# Patient Record
Sex: Female | Born: 1955 | Race: White | Hispanic: No | Smoking: Former smoker
Health system: Southern US, Community
[De-identification: ages and names within clinical notes are randomized; demographics above are authoritative.]

## PROBLEM LIST (undated history)

## (undated) DIAGNOSIS — M1990 Unspecified osteoarthritis, unspecified site: Secondary | ICD-10-CM

## (undated) DIAGNOSIS — I6523 Occlusion and stenosis of bilateral carotid arteries: Secondary | ICD-10-CM

## (undated) DIAGNOSIS — E785 Hyperlipidemia, unspecified: Secondary | ICD-10-CM

## (undated) DIAGNOSIS — J439 Emphysema, unspecified: Secondary | ICD-10-CM

## (undated) DIAGNOSIS — I700 Atherosclerosis of aorta: Secondary | ICD-10-CM

## (undated) DIAGNOSIS — J449 Chronic obstructive pulmonary disease, unspecified: Secondary | ICD-10-CM

## (undated) DIAGNOSIS — D649 Anemia, unspecified: Secondary | ICD-10-CM

## (undated) DIAGNOSIS — C801 Malignant (primary) neoplasm, unspecified: Secondary | ICD-10-CM

## (undated) DIAGNOSIS — C482 Malignant neoplasm of peritoneum, unspecified: Secondary | ICD-10-CM

## (undated) DIAGNOSIS — I739 Peripheral vascular disease, unspecified: Secondary | ICD-10-CM

## (undated) DIAGNOSIS — J45909 Unspecified asthma, uncomplicated: Secondary | ICD-10-CM

## (undated) DIAGNOSIS — Z9981 Dependence on supplemental oxygen: Secondary | ICD-10-CM

## (undated) DIAGNOSIS — C569 Malignant neoplasm of unspecified ovary: Secondary | ICD-10-CM

## (undated) DIAGNOSIS — I10 Essential (primary) hypertension: Secondary | ICD-10-CM

## (undated) DIAGNOSIS — G629 Polyneuropathy, unspecified: Secondary | ICD-10-CM

## (undated) DIAGNOSIS — E7800 Pure hypercholesterolemia, unspecified: Secondary | ICD-10-CM

## (undated) HISTORY — PX: TUBAL LIGATION: SHX77

## (undated) HISTORY — DX: Anemia, unspecified: D64.9

## (undated) HISTORY — DX: Polyneuropathy, unspecified: G62.9

---

## 2006-07-15 ENCOUNTER — Ambulatory Visit: Payer: Self-pay | Admitting: Nurse Practitioner

## 2007-03-12 ENCOUNTER — Ambulatory Visit: Payer: Self-pay | Admitting: Nurse Practitioner

## 2009-02-14 ENCOUNTER — Ambulatory Visit: Payer: Self-pay

## 2009-03-21 ENCOUNTER — Ambulatory Visit: Payer: Self-pay | Admitting: Family Medicine

## 2009-03-30 ENCOUNTER — Ambulatory Visit: Payer: Self-pay | Admitting: Family Medicine

## 2009-12-08 ENCOUNTER — Ambulatory Visit: Payer: Self-pay | Admitting: Family Medicine

## 2010-03-22 ENCOUNTER — Ambulatory Visit: Payer: Self-pay | Admitting: Family Medicine

## 2010-10-30 ENCOUNTER — Emergency Department: Payer: Self-pay | Admitting: Internal Medicine

## 2011-05-10 ENCOUNTER — Ambulatory Visit: Payer: Self-pay | Admitting: Family Medicine

## 2011-07-22 ENCOUNTER — Emergency Department: Payer: Self-pay | Admitting: Emergency Medicine

## 2012-08-25 ENCOUNTER — Ambulatory Visit: Payer: Self-pay | Admitting: Family Medicine

## 2013-06-15 ENCOUNTER — Inpatient Hospital Stay: Payer: Self-pay | Admitting: Internal Medicine

## 2013-06-15 LAB — CBC
HCT: 48.9 % — ABNORMAL HIGH (ref 35.0–47.0)
HGB: 16.9 g/dL — ABNORMAL HIGH (ref 12.0–16.0)
MCV: 90 fL (ref 80–100)
Platelet: 291 10*3/uL (ref 150–440)
RBC: 5.46 10*6/uL — ABNORMAL HIGH (ref 3.80–5.20)
WBC: 10.5 10*3/uL (ref 3.6–11.0)

## 2013-06-15 LAB — BASIC METABOLIC PANEL
BUN: 4 mg/dL — ABNORMAL LOW (ref 7–18)
Calcium, Total: 9.6 mg/dL (ref 8.5–10.1)
Chloride: 91 mmol/L — ABNORMAL LOW (ref 98–107)
Co2: 37 mmol/L — ABNORMAL HIGH (ref 21–32)
EGFR (Non-African Amer.): >60
Glucose: 118 mg/dL — ABNORMAL HIGH (ref 65–99)
Potassium: 3.9 mmol/L (ref 3.5–5.1)
Sodium: 134 mmol/L — ABNORMAL LOW (ref 136–145)

## 2013-06-15 LAB — MAGNESIUM: Magnesium: 2.1 mg/dL

## 2013-06-15 LAB — PRO B NATRIURETIC PEPTIDE: B-Type Natriuretic Peptide: 15 pg/mL (ref 0–125)

## 2013-06-15 LAB — TROPONIN I: Troponin-I: <0.02 ng/mL

## 2013-06-16 LAB — CBC WITH DIFFERENTIAL/PLATELET
Basophil #: 0 10*3/uL (ref 0.0–0.1)
Eosinophil #: 0 10*3/uL (ref 0.0–0.7)
Eosinophil %: 0.1 %
HGB: 14.6 g/dL (ref 12.0–16.0)
Lymphocyte #: 0.4 10*3/uL — ABNORMAL LOW (ref 1.0–3.6)
MCH: 30.5 pg (ref 26.0–34.0)
Monocyte #: 0.2 x10 3/mm (ref 0.2–0.9)
Monocyte %: 4 %
RDW: 12.9 % (ref 11.5–14.5)
WBC: 5.7 10*3/uL (ref 3.6–11.0)

## 2013-06-16 LAB — BASIC METABOLIC PANEL
Anion Gap: 4 — ABNORMAL LOW (ref 7–16)
BUN: 7 mg/dL (ref 7–18)
Chloride: 98 mmol/L (ref 98–107)
Co2: 33 mmol/L — ABNORMAL HIGH (ref 21–32)
Creatinine: 0.53 mg/dL — ABNORMAL LOW (ref 0.60–1.30)
EGFR (African American): >60
Glucose: 135 mg/dL — ABNORMAL HIGH (ref 65–99)
Osmolality: 270 (ref 275–301)
Potassium: 4.2 mmol/L (ref 3.5–5.1)

## 2013-09-16 ENCOUNTER — Emergency Department: Payer: Self-pay | Admitting: Emergency Medicine

## 2013-09-16 LAB — CBC
HGB: 14 g/dL (ref 12.0–16.0)
MCH: 30.5 pg (ref 26.0–34.0)
MCHC: 34.6 g/dL (ref 32.0–36.0)
Platelet: 340 10*3/uL (ref 150–440)
RBC: 4.6 10*6/uL (ref 3.80–5.20)
RDW: 13.7 % (ref 11.5–14.5)
WBC: 7.8 10*3/uL (ref 3.6–11.0)

## 2013-09-17 LAB — BASIC METABOLIC PANEL
Anion Gap: 5 — ABNORMAL LOW (ref 7–16)
BUN: 6 mg/dL — ABNORMAL LOW (ref 7–18)
Calcium, Total: 9.6 mg/dL (ref 8.5–10.1)
Co2: 33 mmol/L — ABNORMAL HIGH (ref 21–32)
EGFR (Non-African Amer.): >60
Osmolality: 270 (ref 275–301)
Potassium: 3.2 mmol/L — ABNORMAL LOW (ref 3.5–5.1)

## 2013-09-17 LAB — CK TOTAL AND CKMB (NOT AT ARMC): CK, Total: 57 U/L (ref 21–215)

## 2013-09-17 LAB — TROPONIN I: Troponin-I: <0.02 ng/mL

## 2013-09-24 ENCOUNTER — Emergency Department: Payer: Self-pay | Admitting: Internal Medicine

## 2013-09-24 LAB — URINALYSIS, COMPLETE
Bilirubin,UR: NEGATIVE
Ketone: NEGATIVE
Leukocyte Esterase: NEGATIVE
Nitrite: NEGATIVE
Protein: NEGATIVE
RBC,UR: NONE SEEN /HPF (ref 0–5)
Specific Gravity: 1.003 (ref 1.003–1.030)
Squamous Epithelial: <1
WBC UR: NONE SEEN /HPF (ref 0–5)

## 2013-09-24 LAB — CBC
HCT: 39.1 % (ref 35.0–47.0)
HGB: 13.6 g/dL (ref 12.0–16.0)
MCH: 30.3 pg (ref 26.0–34.0)
MCV: 87 fL (ref 80–100)
Platelet: 262 10*3/uL (ref 150–440)
RBC: 4.49 10*6/uL (ref 3.80–5.20)
RDW: 13.6 % (ref 11.5–14.5)
WBC: 11.8 10*3/uL — ABNORMAL HIGH (ref 3.6–11.0)

## 2013-09-24 LAB — TROPONIN I: Troponin-I: <0.02 ng/mL

## 2013-09-24 LAB — COMPREHENSIVE METABOLIC PANEL
Albumin: 3.8 g/dL (ref 3.4–5.0)
Alkaline Phosphatase: 77 U/L (ref 50–136)
Anion Gap: 6 — ABNORMAL LOW (ref 7–16)
BUN: 6 mg/dL — ABNORMAL LOW (ref 7–18)
Chloride: 94 mmol/L — ABNORMAL LOW (ref 98–107)
EGFR (African American): >60
EGFR (Non-African Amer.): >60
Glucose: 100 mg/dL — ABNORMAL HIGH (ref 65–99)
Potassium: 3.6 mmol/L (ref 3.5–5.1)
SGOT(AST): 19 U/L (ref 15–37)
SGPT (ALT): 20 U/L (ref 12–78)
Sodium: 130 mmol/L — ABNORMAL LOW (ref 136–145)
Total Protein: 6.8 g/dL (ref 6.4–8.2)

## 2013-09-24 LAB — CK TOTAL AND CKMB (NOT AT ARMC): CK, Total: 36 U/L (ref 21–215)

## 2013-10-04 ENCOUNTER — Inpatient Hospital Stay: Payer: Self-pay | Admitting: Internal Medicine

## 2013-10-04 LAB — CBC
HCT: 35.8 % (ref 35.0–47.0)
HGB: 12.9 g/dL (ref 12.0–16.0)
MCH: 30.7 pg (ref 26.0–34.0)
MCV: 85 fL (ref 80–100)
Platelet: 222 10*3/uL (ref 150–440)
RBC: 4.2 10*6/uL (ref 3.80–5.20)
WBC: 10.6 10*3/uL (ref 3.6–11.0)

## 2013-10-04 LAB — URINALYSIS, COMPLETE
Glucose,UR: NEGATIVE mg/dL (ref 0–75)
Leukocyte Esterase: NEGATIVE
Protein: NEGATIVE
Squamous Epithelial: 2
WBC UR: 7 /HPF (ref 0–5)

## 2013-10-04 LAB — BASIC METABOLIC PANEL
Anion Gap: 5 — ABNORMAL LOW (ref 7–16)
BUN: 6 mg/dL — ABNORMAL LOW (ref 7–18)
Calcium, Total: 8.8 mg/dL (ref 8.5–10.1)
Chloride: 85 mmol/L — ABNORMAL LOW (ref 98–107)
EGFR (African American): >60
Potassium: 3.7 mmol/L (ref 3.5–5.1)
Sodium: 121 mmol/L — ABNORMAL LOW (ref 136–145)

## 2013-10-04 LAB — TROPONIN I: Troponin-I: <0.02 ng/mL

## 2013-10-05 LAB — BASIC METABOLIC PANEL
Anion Gap: 3 — ABNORMAL LOW (ref 7–16)
BUN: 6 mg/dL — ABNORMAL LOW (ref 7–18)
Calcium, Total: 8 mg/dL — ABNORMAL LOW (ref 8.5–10.1)
Co2: 30 mmol/L (ref 21–32)
Creatinine: 0.58 mg/dL — ABNORMAL LOW (ref 0.60–1.30)
EGFR (African American): >60
Glucose: 97 mg/dL (ref 65–99)
Osmolality: 260 (ref 275–301)

## 2013-10-05 LAB — CBC WITH DIFFERENTIAL/PLATELET
Basophil #: 0.1 10*3/uL (ref 0.0–0.1)
HCT: 32.5 % — ABNORMAL LOW (ref 35.0–47.0)
HGB: 11.7 g/dL — ABNORMAL LOW (ref 12.0–16.0)
Lymphocyte %: 20.1 %
MCH: 31.3 pg (ref 26.0–34.0)
MCV: 87 fL (ref 80–100)
Monocyte #: 0.8 x10 3/mm (ref 0.2–0.9)
Neutrophil #: 3.9 10*3/uL (ref 1.4–6.5)
RBC: 3.74 10*6/uL — ABNORMAL LOW (ref 3.80–5.20)
RDW: 13.4 % (ref 11.5–14.5)

## 2013-10-08 ENCOUNTER — Inpatient Hospital Stay: Payer: Self-pay | Admitting: Internal Medicine

## 2013-10-08 LAB — COMPREHENSIVE METABOLIC PANEL
Albumin: 3.2 g/dL — ABNORMAL LOW (ref 3.4–5.0)
Anion Gap: 7 (ref 7–16)
BUN: 4 mg/dL — ABNORMAL LOW (ref 7–18)
Calcium, Total: 8.8 mg/dL (ref 8.5–10.1)
Chloride: 91 mmol/L — ABNORMAL LOW (ref 98–107)
EGFR (African American): >60
EGFR (Non-African Amer.): >60
Glucose: 103 mg/dL — ABNORMAL HIGH (ref 65–99)
Potassium: 3.8 mmol/L (ref 3.5–5.1)
SGOT(AST): 20 U/L (ref 15–37)
Sodium: 128 mmol/L — ABNORMAL LOW (ref 136–145)
Total Protein: 5.7 g/dL — ABNORMAL LOW (ref 6.4–8.2)

## 2013-10-08 LAB — CBC
HCT: 31.3 % — ABNORMAL LOW (ref 35.0–47.0)
HGB: 11.1 g/dL — ABNORMAL LOW (ref 12.0–16.0)
MCH: 30.6 pg (ref 26.0–34.0)
MCHC: 35.4 g/dL (ref 32.0–36.0)
MCV: 86 fL (ref 80–100)
Platelet: 243 10*3/uL (ref 150–440)
RDW: 13.1 % (ref 11.5–14.5)
WBC: 7.3 10*3/uL (ref 3.6–11.0)

## 2013-10-08 LAB — URINALYSIS, COMPLETE
Bacteria: NONE SEEN
Bilirubin,UR: NEGATIVE
Blood: NEGATIVE
Glucose,UR: NEGATIVE mg/dL (ref 0–75)
Ketone: NEGATIVE
Nitrite: NEGATIVE
Protein: NEGATIVE
Specific Gravity: 1.003 (ref 1.003–1.030)
Squamous Epithelial: 1
WBC UR: <1 /HPF (ref 0–5)

## 2013-10-08 LAB — CK TOTAL AND CKMB (NOT AT ARMC)
CK, Total: 57 U/L (ref 21–215)
CK-MB: 1.5 ng/mL (ref 0.5–3.6)

## 2013-10-08 LAB — TROPONIN I: Troponin-I: 0.07 ng/mL — ABNORMAL HIGH

## 2013-10-09 LAB — LIPID PANEL
Ldl Cholesterol, Calc: 88 mg/dL (ref 0–100)
Triglycerides: 82 mg/dL (ref 0–200)

## 2013-10-09 LAB — CK TOTAL AND CKMB (NOT AT ARMC)
CK, Total: 58 U/L (ref 21–215)
CK, Total: 61 U/L (ref 21–215)
CK-MB: 2.9 ng/mL (ref 0.5–3.6)

## 2013-10-09 LAB — CK-MB
CK-MB: 2.9 ng/mL (ref 0.5–3.6)
CK-MB: 2.7 ng/mL (ref 0.5–3.6)

## 2013-10-09 LAB — TROPONIN I: Troponin-I: 0.35 ng/mL — ABNORMAL HIGH

## 2013-10-26 ENCOUNTER — Inpatient Hospital Stay: Payer: Self-pay | Admitting: Internal Medicine

## 2013-10-26 LAB — COMPREHENSIVE METABOLIC PANEL
Albumin: 3.5 g/dL (ref 3.4–5.0)
Alkaline Phosphatase: 65 U/L
BUN: 6 mg/dL — ABNORMAL LOW (ref 7–18)
Calcium, Total: 8.8 mg/dL (ref 8.5–10.1)
Co2: 28 mmol/L (ref 21–32)
Creatinine: 0.66 mg/dL (ref 0.60–1.30)
EGFR (African American): >60
EGFR (Non-African Amer.): >60
Osmolality: 266 (ref 275–301)
Potassium: 3.1 mmol/L — ABNORMAL LOW (ref 3.5–5.1)
SGOT(AST): 18 U/L (ref 15–37)
Total Protein: 6.2 g/dL — ABNORMAL LOW (ref 6.4–8.2)

## 2013-10-26 LAB — DIFFERENTIAL
Bands: 8 %
Comment - H1-Com2: NORMAL
Lymphocytes: 3 %
Monocytes: 3 %
Segmented Neutrophils: 86 %

## 2013-10-26 LAB — CBC
HGB: 11.9 g/dL — ABNORMAL LOW (ref 12.0–16.0)
MCHC: 33.2 g/dL (ref 32.0–36.0)
MCV: 88 fL (ref 80–100)
Platelet: 277 10*3/uL (ref 150–440)
RBC: 4.09 10*6/uL (ref 3.80–5.20)
WBC: 24 10*3/uL — ABNORMAL HIGH (ref 3.6–11.0)

## 2013-10-26 LAB — CK TOTAL AND CKMB (NOT AT ARMC)
CK, Total: 56 U/L (ref 21–215)
CK-MB: 0.8 ng/mL (ref 0.5–3.6)

## 2013-10-26 LAB — TROPONIN I: Troponin-I: <0.02 ng/mL

## 2013-10-27 LAB — CBC WITH DIFFERENTIAL/PLATELET
Eosinophil %: 0 %
HGB: 11.5 g/dL — ABNORMAL LOW (ref 12.0–16.0)
Lymphocyte %: 2.3 %
MCHC: 33.7 g/dL (ref 32.0–36.0)
Monocyte %: 2.6 %
Platelet: 257 10*3/uL (ref 150–440)
RBC: 3.88 10*6/uL (ref 3.80–5.20)
RDW: 13.3 % (ref 11.5–14.5)
WBC: 26 10*3/uL — ABNORMAL HIGH (ref 3.6–11.0)

## 2013-10-27 LAB — BASIC METABOLIC PANEL
Calcium, Total: 9.1 mg/dL (ref 8.5–10.1)
Co2: 29 mmol/L (ref 21–32)
EGFR (African American): >60
EGFR (Non-African Amer.): >60
Glucose: 139 mg/dL — ABNORMAL HIGH (ref 65–99)
Osmolality: 277 (ref 275–301)
Potassium: 3.5 mmol/L (ref 3.5–5.1)

## 2013-10-28 LAB — VANCOMYCIN, TROUGH: Vancomycin, Trough: 4 ug/mL — ABNORMAL LOW (ref 10–20)

## 2013-10-28 LAB — CBC WITH DIFFERENTIAL/PLATELET
Basophil #: 0 10*3/uL (ref 0.0–0.1)
Basophil %: 0.1 %
Eosinophil #: 0 10*3/uL (ref 0.0–0.7)
Eosinophil %: 0 %
HCT: 33.4 % — ABNORMAL LOW (ref 35.0–47.0)
HGB: 11.4 g/dL — ABNORMAL LOW (ref 12.0–16.0)
Lymphocyte #: 0.5 10*3/uL — ABNORMAL LOW (ref 1.0–3.6)
MCH: 30.2 pg (ref 26.0–34.0)
MCHC: 34.2 g/dL (ref 32.0–36.0)
MCV: 88 fL (ref 80–100)
Monocyte #: 0.8 x10 3/mm (ref 0.2–0.9)
Neutrophil %: 94.9 %
RDW: 13.3 % (ref 11.5–14.5)
WBC: 24.8 10*3/uL — ABNORMAL HIGH (ref 3.6–11.0)

## 2013-10-28 LAB — BASIC METABOLIC PANEL
Anion Gap: 7 (ref 7–16)
EGFR (African American): >60
EGFR (Non-African Amer.): >60
Osmolality: 278 (ref 275–301)
Potassium: 3.5 mmol/L (ref 3.5–5.1)
Sodium: 139 mmol/L (ref 136–145)

## 2013-10-29 LAB — BASIC METABOLIC PANEL
BUN: 6 mg/dL — ABNORMAL LOW (ref 7–18)
Chloride: 104 mmol/L (ref 98–107)
EGFR (African American): >60
EGFR (Non-African Amer.): >60
Osmolality: 277 (ref 275–301)

## 2013-10-29 LAB — CBC WITH DIFFERENTIAL/PLATELET
Basophil %: 0.2 %
MCV: 88 fL (ref 80–100)
Monocyte %: 4.4 %
Neutrophil #: 12.5 10*3/uL — ABNORMAL HIGH (ref 1.4–6.5)
Platelet: 285 10*3/uL (ref 150–440)
RBC: 3.49 10*6/uL — ABNORMAL LOW (ref 3.80–5.20)
RDW: 13.1 % (ref 11.5–14.5)
WBC: 13.7 10*3/uL — ABNORMAL HIGH (ref 3.6–11.0)

## 2013-10-30 LAB — VANCOMYCIN, TROUGH: Vancomycin, Trough: 17 ug/mL (ref 10–20)

## 2013-10-31 LAB — CULTURE, BLOOD (SINGLE)

## 2013-11-02 LAB — CREATININE, SERUM
Creatinine: 0.63 mg/dL (ref 0.60–1.30)
EGFR (African American): >60

## 2013-11-02 LAB — VANCOMYCIN, TROUGH: Vancomycin, Trough: 15 ug/mL (ref 10–20)

## 2013-12-06 ENCOUNTER — Emergency Department: Payer: Self-pay | Admitting: Emergency Medicine

## 2013-12-06 LAB — COMPREHENSIVE METABOLIC PANEL
ALBUMIN: 3.1 g/dL — AB (ref 3.4–5.0)
ANION GAP: 5 — AB (ref 7–16)
Alkaline Phosphatase: 86 U/L
BILIRUBIN TOTAL: 0.2 mg/dL (ref 0.2–1.0)
BUN: 4 mg/dL — ABNORMAL LOW (ref 7–18)
CALCIUM: 8.7 mg/dL (ref 8.5–10.1)
CHLORIDE: 104 mmol/L (ref 98–107)
Co2: 29 mmol/L (ref 21–32)
Creatinine: 0.62 mg/dL (ref 0.60–1.30)
EGFR (African American): >60
EGFR (Non-African Amer.): >60
Glucose: 92 mg/dL (ref 65–99)
Osmolality: 272 (ref 275–301)
Potassium: 3.5 mmol/L (ref 3.5–5.1)
SGOT(AST): 26 U/L (ref 15–37)
SGPT (ALT): 19 U/L (ref 12–78)
Sodium: 138 mmol/L (ref 136–145)
Total Protein: 6.5 g/dL (ref 6.4–8.2)

## 2013-12-06 LAB — CBC
HCT: 32.4 % — ABNORMAL LOW (ref 35.0–47.0)
HGB: 11 g/dL — ABNORMAL LOW (ref 12.0–16.0)
MCH: 29.2 pg (ref 26.0–34.0)
MCHC: 33.8 g/dL (ref 32.0–36.0)
MCV: 87 fL (ref 80–100)
Platelet: 317 10*3/uL (ref 150–440)
RBC: 3.75 10*6/uL — ABNORMAL LOW (ref 3.80–5.20)
RDW: 14.4 % (ref 11.5–14.5)
WBC: 10.2 10*3/uL (ref 3.6–11.0)

## 2013-12-06 LAB — CK TOTAL AND CKMB (NOT AT ARMC)
CK, TOTAL: 51 U/L (ref 21–215)
CK-MB: 0.7 ng/mL (ref 0.5–3.6)

## 2013-12-06 LAB — TROPONIN I: Troponin-I: <0.02 ng/mL

## 2014-02-14 ENCOUNTER — Ambulatory Visit: Payer: Self-pay | Admitting: Family Medicine

## 2014-05-26 ENCOUNTER — Ambulatory Visit: Payer: Self-pay | Admitting: Family Medicine

## 2014-07-19 ENCOUNTER — Emergency Department: Payer: Self-pay | Admitting: Internal Medicine

## 2014-07-19 LAB — COMPREHENSIVE METABOLIC PANEL
ALT: 16 U/L
Albumin: 3.6 g/dL (ref 3.4–5.0)
Alkaline Phosphatase: 99 U/L
Anion Gap: 7 (ref 7–16)
BILIRUBIN TOTAL: 0.2 mg/dL (ref 0.2–1.0)
BUN: 8 mg/dL (ref 7–18)
Calcium, Total: 8.8 mg/dL (ref 8.5–10.1)
Chloride: 106 mmol/L (ref 98–107)
Co2: 30 mmol/L (ref 21–32)
Creatinine: 0.78 mg/dL (ref 0.60–1.30)
EGFR (African American): >60
Glucose: 99 mg/dL (ref 65–99)
Osmolality: 283 (ref 275–301)
POTASSIUM: 3.7 mmol/L (ref 3.5–5.1)
SGOT(AST): 29 U/L (ref 15–37)
Sodium: 143 mmol/L (ref 136–145)
Total Protein: 6.8 g/dL (ref 6.4–8.2)

## 2014-07-19 LAB — CBC
HCT: 34.2 % — AB (ref 35.0–47.0)
HGB: 11.1 g/dL — ABNORMAL LOW (ref 12.0–16.0)
MCH: 27.6 pg (ref 26.0–34.0)
MCHC: 32.4 g/dL (ref 32.0–36.0)
MCV: 85 fL (ref 80–100)
PLATELETS: 272 10*3/uL (ref 150–440)
RBC: 4 10*6/uL (ref 3.80–5.20)
RDW: 13.9 % (ref 11.5–14.5)
WBC: 6.2 10*3/uL (ref 3.6–11.0)

## 2014-07-19 LAB — TROPONIN I: Troponin-I: <0.02 ng/mL

## 2014-07-19 LAB — CK TOTAL AND CKMB (NOT AT ARMC)
CK, Total: 131 U/L
CK-MB: 1.1 ng/mL (ref 0.5–3.6)

## 2014-10-18 ENCOUNTER — Emergency Department: Payer: Self-pay | Admitting: Emergency Medicine

## 2014-10-18 LAB — CBC
HCT: 32.1 % — ABNORMAL LOW (ref 35.0–47.0)
HGB: 10.5 g/dL — AB (ref 12.0–16.0)
MCH: 27.1 pg (ref 26.0–34.0)
MCHC: 32.7 g/dL (ref 32.0–36.0)
MCV: 83 fL (ref 80–100)
Platelet: 303 10*3/uL (ref 150–440)
RBC: 3.86 10*6/uL (ref 3.80–5.20)
RDW: 13.4 % (ref 11.5–14.5)
WBC: 6.4 10*3/uL (ref 3.6–11.0)

## 2014-10-18 LAB — COMPREHENSIVE METABOLIC PANEL
ALK PHOS: 98 U/L
ANION GAP: 5 — AB (ref 7–16)
AST: 24 U/L (ref 15–37)
Albumin: 3.9 g/dL (ref 3.4–5.0)
BILIRUBIN TOTAL: 0.3 mg/dL (ref 0.2–1.0)
BUN: 5 mg/dL — ABNORMAL LOW (ref 7–18)
CO2: 33 mmol/L — AB (ref 21–32)
Calcium, Total: 9.1 mg/dL (ref 8.5–10.1)
Chloride: 102 mmol/L (ref 98–107)
Creatinine: 0.61 mg/dL (ref 0.60–1.30)
EGFR (Non-African Amer.): >60
Glucose: 102 mg/dL — ABNORMAL HIGH (ref 65–99)
Osmolality: 277 (ref 275–301)
POTASSIUM: 3.7 mmol/L (ref 3.5–5.1)
SGPT (ALT): 19 U/L
Sodium: 140 mmol/L (ref 136–145)
Total Protein: 6.9 g/dL (ref 6.4–8.2)

## 2014-10-18 LAB — TROPONIN I

## 2014-11-07 ENCOUNTER — Emergency Department: Payer: Self-pay | Admitting: Emergency Medicine

## 2014-11-07 LAB — BASIC METABOLIC PANEL
Anion Gap: 8 (ref 7–16)
BUN: 6 mg/dL — ABNORMAL LOW (ref 7–18)
CHLORIDE: 102 mmol/L (ref 98–107)
Calcium, Total: 9.4 mg/dL (ref 8.5–10.1)
Co2: 29 mmol/L (ref 21–32)
Creatinine: 0.65 mg/dL (ref 0.60–1.30)
EGFR (African American): >60
GLUCOSE: 108 mg/dL — AB (ref 65–99)
OSMOLALITY: 276 (ref 275–301)
POTASSIUM: 3.6 mmol/L (ref 3.5–5.1)
Sodium: 139 mmol/L (ref 136–145)

## 2014-11-07 LAB — TROPONIN I: Troponin-I: <0.02 ng/mL

## 2014-11-07 LAB — CBC
HCT: 32.4 % — AB (ref 35.0–47.0)
HGB: 10.6 g/dL — AB (ref 12.0–16.0)
MCH: 26.7 pg (ref 26.0–34.0)
MCHC: 32.6 g/dL (ref 32.0–36.0)
MCV: 82 fL (ref 80–100)
Platelet: 323 10*3/uL (ref 150–440)
RBC: 3.96 10*6/uL (ref 3.80–5.20)
RDW: 13.5 % (ref 11.5–14.5)
WBC: 13.5 10*3/uL — ABNORMAL HIGH (ref 3.6–11.0)

## 2014-11-07 LAB — PRO B NATRIURETIC PEPTIDE: B-Type Natriuretic Peptide: 76 pg/mL (ref 0–125)

## 2015-02-17 ENCOUNTER — Emergency Department (HOSPITAL_COMMUNITY): Payer: Medicare HMO

## 2015-02-17 ENCOUNTER — Encounter (HOSPITAL_COMMUNITY): Payer: Self-pay | Admitting: Emergency Medicine

## 2015-02-17 ENCOUNTER — Emergency Department (HOSPITAL_COMMUNITY)
Admission: EM | Admit: 2015-02-17 | Discharge: 2015-02-17 | Disposition: A | Discharge status: Home / Self Care | Payer: Medicare HMO | Attending: Emergency Medicine | Admitting: Emergency Medicine

## 2015-02-17 DIAGNOSIS — Z79899 Other long term (current) drug therapy: Secondary | ICD-10-CM | POA: Insufficient documentation

## 2015-02-17 DIAGNOSIS — J441 Chronic obstructive pulmonary disease with (acute) exacerbation: Secondary | ICD-10-CM | POA: Diagnosis not present

## 2015-02-17 DIAGNOSIS — E780 Pure hypercholesterolemia: Secondary | ICD-10-CM | POA: Diagnosis not present

## 2015-02-17 DIAGNOSIS — R0602 Shortness of breath: Secondary | ICD-10-CM | POA: Diagnosis present

## 2015-02-17 DIAGNOSIS — I10 Essential (primary) hypertension: Secondary | ICD-10-CM | POA: Insufficient documentation

## 2015-02-17 HISTORY — DX: Essential (primary) hypertension: I10

## 2015-02-17 HISTORY — DX: Chronic obstructive pulmonary disease, unspecified: J44.9

## 2015-02-17 HISTORY — DX: Pure hypercholesterolemia, unspecified: E78.00

## 2015-02-17 LAB — TROPONIN I

## 2015-02-17 LAB — CBC
HCT: 35.6 % — ABNORMAL LOW (ref 36.0–46.0)
Hemoglobin: 11.2 g/dL — ABNORMAL LOW (ref 12.0–15.0)
MCH: 27.3 pg (ref 26.0–34.0)
MCHC: 31.5 g/dL (ref 30.0–36.0)
MCV: 86.6 fL (ref 78.0–100.0)
Platelets: 254 10*3/uL (ref 150–400)
RBC: 4.11 MIL/uL (ref 3.87–5.11)
RDW: 15.2 % (ref 11.5–15.5)
WBC: 8.8 10*3/uL (ref 4.0–10.5)

## 2015-02-17 LAB — BASIC METABOLIC PANEL
Anion gap: 8 (ref 5–15)
BUN: 6 mg/dL (ref 6–23)
CALCIUM: 8.9 mg/dL (ref 8.4–10.5)
CO2: 31 mmol/L (ref 19–32)
Chloride: 100 mmol/L (ref 96–112)
Creatinine, Ser: 0.68 mg/dL (ref 0.50–1.10)
GFR calc Af Amer: >90 mL/min (ref >90)
GFR calc non Af Amer: >90 mL/min (ref >90)
GLUCOSE: 112 mg/dL — AB (ref 70–99)
Potassium: 3.4 mmol/L — ABNORMAL LOW (ref 3.5–5.1)
SODIUM: 139 mmol/L (ref 135–145)

## 2015-02-17 MED ORDER — HYDROCODONE-ACETAMINOPHEN 5-325 MG PO TABS
ORAL_TABLET | ORAL | Status: AC
  Administered 2015-02-17: 1 via ORAL
  Filled 2015-02-17: qty 1

## 2015-02-17 MED ORDER — PREDNISONE 20 MG PO TABS
40.0000 mg | ORAL_TABLET | Freq: Once | ORAL | Status: AC
  Administered 2015-02-17: 40 mg via ORAL
  Filled 2015-02-17: qty 2

## 2015-02-17 MED ORDER — PREDNISONE 50 MG PO TABS: ORAL_TABLET | ORAL | Status: DC

## 2015-02-17 MED ORDER — HYDROCODONE-ACETAMINOPHEN 5-325 MG PO TABS
1.0000 | ORAL_TABLET | Freq: Once | ORAL | Status: AC
  Administered 2015-02-17: 1 via ORAL

## 2015-02-17 MED ORDER — IPRATROPIUM-ALBUTEROL 0.5-2.5 (3) MG/3ML IN SOLN
3.0000 mL | Freq: Once | RESPIRATORY_TRACT | Status: AC
  Administered 2015-02-17: 3 mL via RESPIRATORY_TRACT
  Filled 2015-02-17: qty 3

## 2015-02-17 NOTE — ED Notes (Signed)
NP at bedside.

## 2015-02-17 NOTE — ED Notes (Addendum)
Ambulated pt with pulse ox per NP request. Pt 02 saturation during ambulation ranged from 92-94%. Pt HR ranged from 105-118. No increased dyspnea noted. Pt wearing home o2 during ambulation. Pt able to carry on conversation and complete sentences during ambulation. Walked with steady gait as well. NP aware.nad noted.

## 2015-02-17 NOTE — ED Notes (Addendum)
Pt reports left sided rib pain that is worse with a deep breath and movement. nad noted. Pt reports headache,productive cough, and fever for last few days. Pt reports wear 2liters nasal cannula at home.

## 2015-02-17 NOTE — Discharge Instructions (Signed)
We gave you a dose of steroids in the ED today. Start your prescription tomorrow. Use your inhaler as needed. Follow up with your doctor. Return here as needed for worsening symptoms.

## 2015-02-17 NOTE — ED Provider Notes (Signed)
CSN: [id number]     Arrival date & time 02/17/15  0940 History   First MD Initiated Contact with Patient 02/17/15 [phone]     Chief Complaint  Patient presents with  . Shortness of Breath     (Consider location/radiation/quality/duration/timing/severity/associated sxs/prior Treatment) Patient is a 59 y.o. female presenting with shortness of breath. The history is provided by the patient.  Shortness of Breath Severity:  Moderate Onset quality:  Gradual Duration:  3 days Timing:  Constant Progression:  Worsening Chronicity:  New Relieved by:  Nothing Worsened by:  Movement and activity Ineffective treatments:  Inhaler, lying down and oxygen Associated symptoms: cough, fever, headaches, sore throat, sputum production and wheezing   Associated symptoms: no abdominal pain, no chest pain, no ear pain, no rash, no swollen glands and no vomiting    [name] is a 59 y.o. female who presents to the ED with shortness of breath. She has chronic COPD. She reports that over the past 3 days she has had increased cough and congestion and thinks she may have had low grade fever. She uses O2 at home on 2 L. Her left rib area has been sore from coughing.   Past Medical History  Diagnosis Date  . Hypertension   . High cholesterol   . COPD (chronic obstructive pulmonary disease)    History reviewed. No pertinent past surgical history. History reviewed. No pertinent family history. History  Substance Use Topics  . Smoking status: Never Smoker   . Smokeless tobacco: Not on file  . Alcohol Use: No   OB History    No data available     Review of Systems  Constitutional: Positive for fever and chills.  HENT: Positive for congestion, rhinorrhea and sore throat. Negative for ear pain.   Eyes: Negative for pain, redness and visual disturbance.  Respiratory: Positive for cough, sputum production, shortness of breath and wheezing.   Cardiovascular: Negative for chest pain.  Gastrointestinal:  Positive for nausea. Negative for vomiting and abdominal pain.  Genitourinary: Negative for dysuria, urgency, frequency, vaginal bleeding, vaginal discharge and pelvic pain.  Skin: Negative for rash.  Neurological: Positive for light-headedness and headaches. Negative for syncope and weakness.  Psychiatric/Behavioral: Negative for confusion. The patient is not nervous/anxious.       Allergies  Review of patient's allergies indicates no known allergies.  Home Medications   Prior to Admission medications   Medication Sig Start Date End Date Taking? Authorizing Provider  albuterol (PROVENTIL HFA;VENTOLIN HFA) 108 (90 BASE) MCG/ACT inhaler Inhale 4-6 puffs into the lungs every 6 (six) hours as needed for wheezing or shortness of breath.   Yes Historical Provider, MD  amLODipine (NORVASC) 5 MG tablet Take 5 mg by mouth at bedtime.   Yes Historical Provider, MD  ferrous sulfate 325 (65 FE) MG tablet Take 325 mg by mouth 2 (two) times daily with a meal.    Yes Historical Provider, MD  gabapentin (NEURONTIN) 300 MG capsule Take 600 mg by mouth daily.   Yes Historical Provider, MD  ipratropium (ATROVENT HFA) 17 MCG/ACT inhaler Inhale 2 puffs into the lungs every 4 (four) hours as needed for wheezing.   Yes Historical Provider, MD  levalbuterol (XOPENEX) 1.25 MG/3ML nebulizer solution Take 1.25 mg by nebulization every 4 (four) hours as needed for wheezing or shortness of breath.   Yes Historical Provider, MD  mometasone-formoterol (DULERA) 200-5 MCG/ACT AERO Inhale 2 puffs into the lungs 2 (two) times daily.   Yes Historical Provider, MD  omeprazole (PRILOSEC) 20 MG capsule Take 20 mg by mouth 2 (two) times daily before a meal.   Yes Historical Provider, MD  OXYGEN Inhale 2 L into the lungs continuous.   Yes Historical Provider, MD  simvastatin (ZOCOR) 40 MG tablet Take 40 mg by mouth at bedtime.   Yes Historical Provider, MD  tiotropium (SPIRIVA) 18 MCG inhalation capsule Place 18 mcg into inhaler  and inhale daily.   Yes Historical Provider, MD  Vitamin D, Ergocalciferol, (DRISDOL) 50000 UNITS CAPS capsule Take 50,000 Units by mouth every 30 (thirty) days.   Yes Historical Provider, MD  predniSONE (DELTASONE) 50 MG tablet Take one tablet daily 02/17/15   Jadamarie Butson Bunnie Pion, NP   BP 113/98 mmHg  Pulse 91  Temp(Src) 98.4 F (36.9 C) (Oral)  Resp 16  Ht 5' (1.524 m)  Wt 128 lb (58.06 kg)  BMI 25.00 kg/m2  SpO2 97% Physical Exam  Constitutional: She is oriented to person, place, and time. She appears well-developed and well-nourished. No distress.  HENT:  Head: Normocephalic.  Right Ear: Tympanic membrane normal.  Left Ear: Tympanic membrane normal.  Nose: Mucosal edema and rhinorrhea present.  Mouth/Throat: Uvula is midline, oropharynx is clear and moist and mucous membranes are normal.  Eyes: Conjunctivae and EOM are normal.  Neck: Normal range of motion. Neck supple.  Cardiovascular: Normal rate and regular rhythm.   Pulmonary/Chest: Effort normal. She has decreased breath sounds. She has wheezes. She has no rales.  Abdominal: Soft. There is no tenderness.  Musculoskeletal: Normal range of motion.  Neurological: She is alert and oriented to person, place, and time. No cranial nerve deficit.  Skin: Skin is warm and dry.  Psychiatric: She has a normal mood and affect. Her behavior is normal.  Nursing note and vitals reviewed.   ED Course  Procedures (including critical care time) Labs, CXR, EKG,  Duoneb, prednisone Labs Review Results for orders placed or performed during the hospital encounter of 02/17/15 (from the past 24 hour(s))  Basic metabolic panel    (if pt has PMH of COPD)     Status: Abnormal   Collection Time: 02/17/15 10:11 AM  Result Value Ref Range   Sodium 139 135 - 145 mmol/L   Potassium 3.4 (L) 3.5 - 5.1 mmol/L   Chloride 100 96 - 112 mmol/L   CO2 31 19 - 32 mmol/L   Glucose, Bld 112 (H) 70 - 99 mg/dL   BUN 6 6 - 23 mg/dL   Creatinine, Ser 0.68 0.50 - 1.10  mg/dL   Calcium 8.9 8.4 - 10.5 mg/dL   GFR calc non Af Amer >90 >90 mL/min   GFR calc Af Amer >90 >90 mL/min   Anion gap 8 5 - 15  CBC     (if pt has PMH of COPD)     Status: Abnormal   Collection Time: 02/17/15 10:11 AM  Result Value Ref Range   WBC 8.8 4.0 - 10.5 K/uL   RBC 4.11 3.87 - 5.11 MIL/uL   Hemoglobin 11.2 (L) 12.0 - 15.0 g/dL   HCT 35.6 (L) 36.0 - 46.0 %   MCV 86.6 78.0 - 100.0 fL   MCH 27.3 26.0 - 34.0 pg   MCHC 31.5 30.0 - 36.0 g/dL   RDW 15.2 11.5 - 15.5 %   Platelets 254 150 - 400 K/uL  Troponin I (if patient has history of COPD)     Status: None   Collection Time: 02/17/15 10:11 AM  Result Value Ref  Range   Troponin I <0.03 <0.031 ng/mL     Imaging Review Dg Chest 2 View (if Patient Has Fever And/or Copd)  02/17/2015   CLINICAL DATA:  Left side rib pain, shortness of Breath  EXAM: CHEST  2 VIEW  COMPARISON:  None.  FINDINGS: Cardiomediastinal silhouette is unremarkable. No acute infiltrate or pleural effusion. No pulmonary edema. Mild thoracic spine osteopenia. Mild atherosclerotic calcifications of thoracic aorta.  IMPRESSION: No active cardiopulmonary disease.   Electronically Signed   By: Lahoma Crocker M.D.   On: 02/17/2015 10:33     EKG Interpretation   Date/Time:  Friday February 17 2015 09:51:43 EDT Ventricular Rate:  93 PR Interval:  154 QRS Duration: 80 QT Interval:  354 QTC Calculation: 440 R Axis:   91 Text Interpretation:  Sinus rhythm Borderline right axis deviation  Baseline wander in lead(s) V3 Confirmed by Lacinda Axon  MD, BRIAN ([id number]) on  02/17/2015 10:33:33 AM      Dr. Lacinda Axon in to examine the patient. Patient has improved after Duoneb.   MDM  59 y.o. female with cough and congestion that has increased over the past 3 days. She will continue her current home medications  Will add prednisone burst. She is to follow up with her PCP or return if symptoms worsen. Stable for d/c with O2 sat remaining stable, no acute findings on CXR.   Final diagnoses:   COPD with exacerbation       Ashley Murrain, NP 02/18/15 0761  Nat Christen, MD 02/21/15 [phone]

## 2015-02-17 NOTE — ED Notes (Signed)
Lab at bedside

## 2015-03-10 NOTE — H&P (Signed)
PATIENT NAME:  [name], [name] MR#:  [id number] DATE OF BIRTH:  03/07/1956  DATE OF ADMISSION:  10/08/2013  REFERRING PHYSICIAN:  Dr. Jimmye Norman.   PRIMARY CARE PHYSICIAN: [facility].   CHIEF COMPLAINT: Headache, dizziness.  HISTORY OF PRESENT ILLNESS:  A 59 year old Caucasian female with past medical history of hypertension, hyperlipidemia, COPD O2 dependent, presenting with headache, dizziness. She had 2 day duration of occipital headache, 6 to 7 out of 10 in intensity, intermittent burning, occasional radiation to the neck. No worsening or relieving factors. It worsened today, now with associated blurred vision. She also describes having orthostatic symptoms where she becomes lightheaded when going from sitting to standing position which wanes after a few minutes followed by generalized weakness.  In the Emergency Department, initial work-up revealed elevated troponin, despite having no chest pain, shortness of breath or palpitations.   REVIEW OF SYSTEMS:   CONSTITUTIONAL: Denies fevers, fatigue. Positive for generalized weakness.  EYES: Positive for blurred vision, denies double vision, eye pain.  EARS, NOSE, THROAT: Denies tinnitus, ear pain, hearing loss.  RESPIRATORY: Denies cough, wheeze, shortness of breath.  CARDIOVASCULAR: Denies chest pain, palpitations, edema,  GASTROINTESTINAL: Denies nausea, vomiting or diarrhea.  GENITOURINARY:  Denies diarrhea or hematuria.  ENDOCRINE: Denies nocturia or polyuria.  HEMATOLOGIC AND LYMPHATIC: Denies easy bruising or bleeding.  SKIN: Denies rash or lesions.  MUSCULOSKELETAL: Denies pain in neck, back, shoulder, knees, hips, arthritic symptoms.  NEUROLOGIC: Denies paralysis, paresthesias, positive for headache as described above.  PSYCHIATRIC: Denies any anxiety or depressive symptoms.  Otherwise, full review of systems performed by me is negative.   PAST MEDICAL HISTORY: COPD for which she is O2 dependent, 2 liters nasal cannula, hypertension,  hyperlipidemia, recent discharge from hospital November the 19th with hyponatremia and hypovolemia.   SOCIAL HISTORY: Every day tobacco use, about half-pack a day. Denies any alcohol or drug usage.   FAMILY HISTORY: Positive for coronary artery disease and COPD.   ALLERGIES: No known drug allergies.   HOME MEDICATIONS: Simvastatin 40 mg p.o. daily, Atrovent 17 micrograms inhalation 2 puffs 4 times daily as needed for shortness of breath, Dulera 2 puffs b.i.d., Proventil 90 mcg inhalation 2 puffs 4 times a day as needed, Xopenex nebulizer 4 times daily as needed.   PHYSICAL EXAMINATION: VITAL SIGNS: Temperature 97.8, heart rate 73, respirations 16, blood pressure 156/93, saturating 94% on 2 liters nasal cannula, weight 49 kg, BMI 21.1.  GENERAL: Well-nourished, well-developed, pleasant female who is currently in no acute distress.  HEAD: Normocephalic, atraumatic.  EYES: Pupils are equal, round and reactive to light, EOMI,  no scleral icterus.  MOUTH: Moist and dentition intact. No abscess noted.  EARS, NOSE AND THROAT: Throat clear without exudate. No external lesions.  NECK: Supple. No thyromegaly. No nodules. No JVD.  PULMONARY: Clear to auscultation bilaterally. No wheezes, rales or rhonchi. No use of accessory muscle use.  Good respiratory effort. CHEST: Nontender to palpation. CARDIOVASCULAR: S1, S2, regular rate and rhythm. No murmurs, rubs or gallops. No edema. Pedal pulses 2+. GASTROINTESTINAL Soft, nontender, nondistended. No masses. Positive bowel sounds. No hepatosplenomegaly.  MUSCULOSKELETAL: No swelling, clubbing or edema. Range of motion full in all extremities.  NEUROLOGIC: Cranial nerves II through XII intact. No gross motor neurological deficits. Sensation and reflexes intact.  SKIN: No ulcerations, lesions, rashes, cyanosis. Skin warm, dry, turgor intact.  PSYCHIATRIC:  The patient is alert, awake, oriented x 3. Insight and judgment intact.   LABORATORY DATA: EKG  performed revealing normal sinus rhythm, heart  rate of 90, sodium 120, potassium 3.8, chloride 91, bicarb 30, BUN 4, creatinine 0.41, glucose 103. Total protein 5.7, albumin 3.2, remainder of LFTs within normal limits. Troponin I 0.07. WBC 7.3, hemoglobin 11.1, platelets of 243. Urinalysis negative for evidence of infection.   ASSESSMENT AND PLAN: A 59 year old Caucasian female with past medical history of hypertension, hyperlipidemia, chronic obstructive pulmonary disease, presenting with headache and dizziness, found to have elevated troponin despite no symptoms. 1.  Elevated troponin, unclear etiology. Given aspirin and Lovenox. We will admit to observation, trend cardiac enzymes. It would be rather strange for the potential of cardiac disease. 2.     Check orthostatic vital signs, add IV fluid hydration. 3.    Hypertension.  Recent medication stopped including hydrochlorothiazide. Will continue to hold hydrochlorothiazide, given hyponatremia which has actually improved from last admission. Follow blood pressure trend though will likely need restart of medications.  5.     Deep venous thrombosis. She is on Lovenox. 6.     The patient is full code.   TIME SPENT: 45 minutes.     ____________________________ Aaron Mose. Ileen Kahre, MD dkh:NTS D: 10/08/2013 22:13:59 ET T: 10/08/2013 22:40:36 ET JOB#: [id number]  cc: Aaron Mose. Daysha Ashmore, MD, <Dictator> Deara Bober Woodfin Ganja MD ELECTRONICALLY SIGNED 10/08/2013 23:45

## 2015-03-10 NOTE — Discharge Summary (Signed)
PATIENT NAME:  [name], [name] MR#:  [id number] DATE OF BIRTH:  09-15-1956  DATE OF ADMISSION:  10/09/2013 DATE OF DISCHARGE:  10/10/2013  ADMITTING PHYSICIAN: Dr. Valentino Nose.   DISCHARGING PHYSICIAN: Dr. Gladstone Lighter.   PRIMARY PHYSICIAN: [facility].   [place]: None.   DISCHARGE DIAGNOSES:  1. Tension headache.  2. Diet-controlled hypertension.  3. Chronic obstructive pulmonary disease, on home oxygen.  4. Nonspecific elevation of troponin.   HOME MEDICATIONS:  1. Atrovent inhaler 2 puffs 4 times a day as needed.  2. Simvastatin 40 mg p.o. at bedtime.  3. Proventil 2 puffs 4 times a day as needed for wheezing.  4. Dulera 2 puffs twice a day.  5. Xopenex 3 mL via nebulizer q.4 hours p.r.n. for shortness of breath.  6. Percocet 5/325 mg 1 tablet q.6 hours p.r.n. for pain.  7. Aspirin 81 mg p.o. daily.   DISCHARGE HOME OXYGEN: Two liters.   DISCHARGE ACTIVITY: As tolerated.   DISCHARGE DIET: Low-sodium diet.    FOLLOWUP INSTRUCTIONS: PCP followup in 2 weeks.   LABS AND IMAGING STUDIES PRIOR TO DISCHARGE: LDL 88, HDL 47, total cholesterol 151, triglycerides of 82. CT of the head without contrast showing no acute intracranial abnormality. Troponins were elevated at 0.35, 0.30 and the last one was 0.09. CK and CK-MB were completely within normal limits.  Other laboratories: WBC 7.3, hemoglobin 11.1, hematocrit 31.2, platelet count 243.   Sodium 128, potassium 3.8, chloride 91, bicarb 30, BUN 4, creatinine 0.41, glucose 103 and calcium of 8.8.   ALT 18, AST 20, alk phos 62, total bili 0.3, albumin of 3.2.   BRIEF HOSPITAL COURSE: [name] is a 59 year old Caucasian female with past medical history significant for COPD with ongoing smoking and hypertension who was recently in the hospital from 10/04/2013 to 10/06/2013 for hyponatremia secondary to hypovolemic hyponatremia and also was on HCTZ. Her medications were stopped. Blood pressure was stable, so  she was discharged home. She comes back a couple of days later complaining of severe headache.  1. Headache, likely tension headache. It was more in the occipital region, coming from her neck with tightness in her neck muscles; however, reason for admission was slightly elevated troponin which was likely nonspecific. She does not have renal failure. EKG: No changes at all, and she did not have any chest pain. So, she was monitored until her enzymes trended down. Headache improved with Tylox p.r.n. and Flexeril p.r.n. Currently, she is doing well. CT head is negative and is being discharged home in a stable condition. She was advised to stay away from HCTZ as her blood pressure has been stable in the hospital. Orthostatic blood pressure changes were checked, and she was stable. She was also advised to quit smoking at this time. Her other home inhalers and oxygen were continued. Her course has been otherwise uneventful in the hospital.   DISCHARGE CONDITION: Stable.   DISCHARGE DISPOSITION: Home.   TIME SPENT ON DISCHARGE: 40 minutes.   ____________________________ Gladstone Lighter, MD rk:gb D: 10/10/2013 11:48:58 ET T: 10/10/2013 23:34:17 ET JOB#: [id number]  cc: Gladstone Lighter, MD, <Dictator> [facility] Gladstone Lighter MD ELECTRONICALLY SIGNED 10/12/2013 18:29

## 2015-03-10 NOTE — Discharge Summary (Signed)
PATIENT NAME:  [name], [name] MR#:  [id number] DATE OF BIRTH:  [birth year]  DATE OF ADMISSION:  10/04/2013 DATE OF DISCHARGE:  10/06/2013  DISCHARGE DIAGNOSES: 1.  Hyponatremia, likely secondary to hydrochlorothiazide use with a component of dehydration, now resolved with hydration and stopping HCTZ. 2.  Chronic respiratory failure, with stage IV chronic obstructive pulmonary disease, on chronic oxygen 2 liters at baseline.  3.  Hypertension, with normal blood pressure in the hospital with some hypotension, likely due to dehydration. Stopped, blood pressure medicine.   SECONDARY DIAGNOSES: 1.  Stage IV chronic obstructive pulmonary disease. 2.  Hypertension.  3.  Hyperlipidemia.   CONSULTATIONS: None.   PROCEDURES/RADIOLOGY: A 2-D echocardiogram on 11/18 was within normal limits. Ejection fraction of 60% to 65%.   Chest x-ray on 11/17 showed no acute findings.   MAJOR LABORATORY PANEL: Urinalysis on admission was negative.   HISTORY AND SHORT HOSPITAL COURSE: The patient is a 59 year old female with the above-mentioned medical problems, admitted for severe hyponatremia, thought to be secondary to hydrochlorothiazide use, along with dehydration. Her HCTZ was stopped and was started on IV hydration, and sodium normalized. She was also found to have chronic respiratory failure, for which she was on oxygen. Her blood pressure was normal. As she was not feeling any more dizzy and was close to her baseline on 11/19, was discharged home in stable condition. On the date of discharge, her vital signs were as follows: Temperature 98.1, heart rate 77 per minute, respirations 20 per minute, blood pressure 129/77 mmHg. She was saturating 100% on 2 liters oxygen via nasal cannula.   PERTINENT PHYSICAL EXAMINATION ON THE DATE OF DISCHARGE:  CARDIOVASCULAR: S1, S2 normal. No murmurs, rubs, or gallop.  LUNGS: Clear to auscultation bilaterally. No wheezing, rales, rhonchi or crepitation.  ABDOMEN: Soft,  benign.  NEUROLOGIC: Nonfocal examination.  All other physical examination remained at baseline.   DISCHARGE MEDICATIONS: 1.  Atrovent 2 puffs inhaled 4 times a day as needed.  2.  Simvastatin 40 mg p.o. at bedtime.  3.  Proventil 2 puffs inhaled 4 times a day as needed.  4.  Dulera 2 puffs inhaled twice a day. 5.  Xopenex nebulizer every four hours as needed.   DISCHARGE DIET: Low sodium.   DISCHARGE ACTIVITY: As tolerated.   DISCHARGE INSTRUCTIONS AND FOLLOW-UP: The patient was instructed to follow up with her primary care physician at [facility] with Dr. Humphrey Rolls.  TOTAL TIME DISCHARGING THIS PATIENT: 45 minutes.      ____________________________ Lucina Mellow. Manuella Ghazi, MD vss:cg D: 10/06/2013 21:44:47 ET T: 10/06/2013 22:16:35 ET JOB#: [id number]  cc: Analie Katzman S. Manuella Ghazi, MD, <Dictator> [place] MD ELECTRONICALLY SIGNED 10/07/2013 16:23

## 2015-03-10 NOTE — Discharge Summary (Signed)
PATIENT NAME:  [name], [name] MR#:  [id number] DATE OF BIRTH:  06-Oct-1956  DATE OF ADMISSION:  06/15/2013 DATE OF DISCHARGE:  06/18/2013   DISCHARGE DIAGNOSES: 1. Acute respiratory failure secondary to chronic obstructive pulmonary disease exacerbation. 2. Chronic obstructive pulmonary disease.  3. Hypertension.   DISCHARGE MEDICATIONS:  1. Lisinopril 5 mg p.o. daily. 2. DuoNeb nebulizer 4 times daily.  3. The patient is on Ventolin inhaler 2 puffs 4 times daily after the nebulizer treatment is finished.  4. The patient is on [facility] 5/100 two puffs b.i.d. 5. Doxycycline 100 mg p.o. b.i.d. for 10 days.  6. Prednisone 20 mg 2 tablets daily for 3 days, then 1 tablet daily for 3 days and then stop.  7. Spiriva 18 mcg inhalation daily.  8. The patient is discharged on 2 liters of oxygen all the time.   DIET: Low-sodium diet, regular consistency.   CONSULTATIONS: None.   HOSPITAL COURSE: The patient is a 59 year old female patient with history of COPD and hypertension, who came in because of shortness of breath. The patient was given Levaquin as an outpatient by her primary doctor, but the patient persistently had trouble breathing. In the ER, the patient's saturation dropped down to 88% on room air at rest, so admitted to the hospitalist service for COPD exacerbation. Chest x-ray clear of any infiltrate. The patient was started on IV Solu-Medrol along with nebulizers and also Rocephin and Zithromax. The patient's wheezing got better, but still had hypoxia. On room air oxygen, O2 saturations dropped to 86% on exertion, like walking, and the patient maintained 98% on 2 liters at rest. With exertion, O2 saturations were 86% on room air. So, the patient is going to be discharged with 2 liters of oxygen, and the patient has a pulmonologist at [facility] that she can follow up with her. She did have lung function studies, according to the patient, so she can follow up with her regarding possible continuation of  oxygen. The patient right now feels better. No wheezing. No shortness of breath. Vitals this morning: Blood pressure 128/75, pulse 86, respirations 20, temperature 98.3, saturations on 2 liters 98%.   PERTINENT LABORATORY DATA: BNP 15. Troponin less than 0.02. Electrolytes on admission: Sodium 134 potassium 3.9, chloride 91, bicarbonate 38, BUN 4, creatinine 0.63, glucose 118. WBC on admission 10.3, hemoglobin 16.9, hematocrit 48.9, platelets 295. Chest x-ray on admission showed no acute abnormality. It showed only interstitial fibrosis. Lungs are clear. The patient stayed stable in terms of CBC and WBC. Repeat chest x-ray done on July 31st showed only COPD and fibrotic changes.   CONDITION: The patient's condition is stable today.   TIME SPENT ON DISCHARGE PREPARATION: More than 30 minutes.   DISPOSITION: The patient will be going home with oxygen.   ____________________________ Epifanio Lesches, MD sk:OSi D: 06/18/2013 10:33:45 ET T: 06/18/2013 10:44:20 ET JOB#: [id number]  cc: Epifanio Lesches, MD, <Dictator> [facility] Epifanio Lesches MD ELECTRONICALLY SIGNED 06/30/2013 16:28

## 2015-03-10 NOTE — H&P (Signed)
PATIENT NAME:  [name], [name] MR#:  [id number] DATE OF BIRTH:  1956-01-07  DATE OF ADMISSION:  10/04/2013  PRIMARY CARE PHYSICIAN: Dr. Humphrey Rolls from [facility].  PRIMARY PULMONOLOGIST Dr. Raul Del.   CHIEF COMPLAINT: Dizziness and weakness with tightness of the chest and shortness of breath.   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 The patient says that she always has chest tightness; however, over the past few days she has had some feelings of shortness of breath with dizziness and lightheadedness, especially when she walks for a period of time and she feels very weak. She does have also some nausea. She denies any syncope, diarrhea, fevers, chills. No chest pain.   REVIEW OF SYSTEMS:    CONSTITUTIONAL: No fever. Positive fatigue and weakness.  EYES: No blurred or double vision, glaucoma, or cataracts.  EARS, NOSE, THROAT: No ear pain, hearing loss, or seasonal allergies.  RESPIRATORY: No cough, wheezing, hemoptysis. Positive history of COPD, on 2 liters of oxygen.  CARDIOVASCULAR: She denies any chest pain. She has chest tightness, which she says is chronic in nature related to her COPD and there has been no change in this. No palpitations, orthopnea, edema, arrhythmia.  GASTROINTESTINAL: Some nausea. No vomiting, diarrhea, abdominal pain, melena, or ulcers.  GENITOURINARY: No dysuria or hematuria.  ENDOCRINE: No polyuria or polydipsia. HEMATOLOGIC AND LYMPHATIC: No anemia or easy bruising.  SKIN: No rashes or lesions.  MUSCULOSKELETAL: No limited activity or arthritis.  NEUROLOGIC: No history of CVA, TIA, or seizures.  PSYCHIATRIC: No history of anxiety or depression.   PAST MEDICAL HISTORY: 1.  Stage IV COPD, on chronic oxygen with chronic respiratory failure.  2.  Hypertension.  3.  Hyperlipidemia.   SOCIAL HISTORY: She smokes 6 to 7 cigarettes a day. She is trying to quit. She does not  use a nicotine patch or any other medications to help her quit. She does want to quit. No alcohol or IV drug use.   ALLERGIES: No known drug allergies.   PAST SURGICAL HISTORY: None.   FAMILY HISTORY: Positive for emphysema, CAD, and Alzheimer's dementia.   PHYSICAL EXAMINATION: VITAL SIGNS: Temperature 97.8. Pulse is 97, respirations 20, blood pressure 98/57, 98% on 2 liters.  GENERAL: The patient is alert, oriented, not in acute distress.  HEENT: Head is atraumatic. Pupils are round and reactive. Sclerae anicteric. Mucous membranes are moist. Oropharynx is clear.  NECK: Supple without JVD, carotid bruit, or enlarged thyroid.  CARDIOVASCULAR: Distant heart sounds without any appreciable murmurs, gallops, or rubs. Regular rate and rhythm. PMI is not displaced.  LUNGS: Clear to auscultation with without any crackles, rales, rhonchi, or wheezing. Normal to chest expansion.  ABDOMEN: Bowel sounds positive. Nontender, nondistended. No hepatosplenomegaly.  EXTREMITIES: No clubbing, cyanosis, or edema.  NEUROLOGIC: Cranial nerves II through XII are grossly intact. The patient is able to move all extremities.  SKIN: Without rashes or lesions.   LABORATORY AND DIAGNOSTIC DATA: White blood cells 10.6, hemoglobin 13, hematocrit 36; platelets are 222. Sodium 121, potassium 3.7, chloride 85, bicarbonate 31, BUN 6, creatinine 0.61, glucose 138. Troponin less than 0.02. Urinalysis shows no LCE or nitrites. Chest x-ray shows no acute cardiopulmonary disease. EKG has normal sinus rhythm, no ST elevations or depressions.   ASSESSMENT AND PLAN: This is a 59 year old female with chronic respiratory failure secondary to stage IV chronic obstructive pulmonary disease who presents with weakness and dizziness and found to have  hyponatremia.  1.  Hyponatremia: I suspect this is secondary to HCTZ use with a component of dehydration. Will provide IV fluids. Obviously hold HCTZ. Will check a sodium level later on this  afternoon to make sure that the sodium level is going in the right direction and will continue to monitor the sodium level closely.  2.  Shortness of breath and tightness of her chest: This seems to be chronic. However, she does say that the shortness of breath has increased. She does not have any evidence of evidence of chronic obstructive pulmonary disease exacerbation on physical exam. I  will go ahead and order an echocardiogram to evaluate her cardiac function as possible etiology for shortness of breath. Will also go ahead and check 3 sets of cardiac enzymes, despite the fact that she says this chest tightness is constant in nature.  3.  History of hypertension: She is now hypotensive, probably from some dehydration. Will hold blood pressure medications at this time.  4.  Hyperlipidemia: Can continue her statin.  5.  Stage IV chronic obstructive pulmonary disease, on chronic oxygen with chronic respiratory failure: The patient will continue on oxygen, continue her inhalers. She does not appear to be in acute exacerbation at this time.  6.  Smoking dependence: The patient was counseled regarding her smoking. She is trying to quit. She is down to 6 to 7 cigarettes a day from a pack. She does not want a nicotine patch. The patient was counseled for 3 minutes.   CODE STATUS: The patient is a full code status.  TIME SPENT: Approximately 40 minutes.    ____________________________ Donell Beers. Benjie Karvonen, MD spm:jcm D: 10/04/2013 12:52:16 ET T: 10/04/2013 13:26:22 ET JOB#: [id number]  cc: Shaiden Aldous P. Benjie Karvonen, MD, <Dictator> Dr. Humphrey Rolls, [place] Raul Del, MD Donell Beers Amari Zagal MD ELECTRONICALLY SIGNED 10/04/2013 15:09

## 2015-03-10 NOTE — H&P (Signed)
PATIENT NAME:  [name], [name] MR#:  [id number] DATE OF BIRTH:  [birth year]  DATE OF ADMISSION:  06/15/2013  PRIMARY CARE PHYSICIAN: From [facility].  CHIEF COMPLAINT: Shortness of breath.   HISTORY OF PRESENT ILLNESS: The patient is a 59 year old female patient with history of COPD, hypertension, hyperlipidemia, came in because of shortness of breath that is going on for about 2 weeks. The patient tried Levaquin and also a tapering course of prednisone as an outpatient. Because of persistence of symptoms, she came in today. In the ER, she received IV Solu-Medrol 125 mg 1 time and also a series of nebulizer treatments for 3 times. Her O2 sats dropped to around 85% on room air at rest. Because of that, we were asked to admit the patient for COPD exacerbation. The patient is seen at the bedside, says that she feels a little better but still feels tight in her chest. No cough. No fever.   PAST MEDICAL HISTORY: Significant for COPD, hypertension, hyperlipidemia, osteoarthritis.   ALLERGIES: No known allergies.   SOCIAL HISTORY: Smokes about 1 pack per day. The patient says that from tomorrow, she is planning to quit. No alcohol. No drugs.   PAST SURGICAL HISTORY: No history of surgeries.   FAMILY HISTORY: Significant for hypertension and diabetes to family members.   MEDICATIONS:  1.  She is on Singulair 10 mg at bedtime. 2.  Spiriva 18 mcg inhalation daily. 3.  Simvastatin 40 mg p.o. daily.  4.  Dulera 100/5 mcg in 2 puffs twice a day.  5.  Lisinopril with HCTZ combination 20/12.5 mg p.o. daily.  6.  She received 20 mg of prednisone 2 tablets for 5 days. She finished it already. 7.  She is also Atrovent HFA and Xopenex nebulizer at home.  8.  The patient was given Chantix by the primary doctor.   REVIEW OF SYSTEMS:    CONSTITUTIONAL: The patient feels tired, but no fever.  EYES: No blurred vision.  EARS, NOSE, THROAT: No tinnitus. No epistaxis. No difficulty swallowing.   RESPIRATIONS: Does have shortness of breath for about 2 weeks.  CARDIOVASCULAR: Has no chest pain. No orthopnea. No PND.  GASTROINTESTINAL: No nausea. No vomiting. No diarrhea.  GENITOURINARY: No dysuria.  ENDOCRINE: No polyuria or nocturia.  HEMATOLOGIC: No anemia.  INTEGUMENTARY: No skin rashes.  MUSCULOSKELETAL: No joint pains.  NEUROLOGIC: No numbness or weakness.  PSYCHIATRIC: No anxiety or insomnia.   PHYSICAL EXAMINATION:  VITAL SIGNS: Temperature 98 Fahrenheit. Heart rate is around 95, blood pressure 176/87. Sats dropped to 85 at the lowest on room air according to the nurse practitioners. During my visit, the patient's sats are around 90% on room air.  GENERAL: She is alert, awake, oriented, not in distress, answering questions appropriately. Well-nourished, well-developed female.  HEAD: Normocephalic, atraumatic.  EYES: No conjunctival pallor. No scleral icterus. Extraocular movements are intact.  NOSE: No turbinate hypertrophy. No nasal lesions.  EARS: No tympanic membrane congestion. No drainage. MOUTH: No oropharyngeal erythema.  NECK: Normal range of motion. Supple. No JVD. Normal thyroid is in the midline.  RESPIRATORY: The patient has poor air entry with faint expiratory wheeze in all lung fields.  CARDIOVASCULAR: S1, S2 regular. PMI not displaced. No peripheral edema. The patient has femoral and pedal pulses intact.  GASTROINTESTINAL: Abdomen is soft, nontender, nondistended. Bowel sounds present. No organomegaly.  MUSCULOSKELETAL: The patient's extremities move x 4. No tenderness. No effusion. Strength and tone are equal bilaterally.  SKIN: Warm and dry.  NEUROLOGICAL:  Alert, awake, oriented. No focal neurological deficit.  PSYCHIATRIC: Mood and affect are within normal limits.   LABORATORY, DIAGNOSTIC AND RADIOLOGICAL DATA: EKG showed sinus tach with PVCs. White count is 10.5, hemoglobin 16.9. BUN 4, creatinine 0.63, sodium 134, potassium 3.9. D-dimer 0.22. Chest  x-ray clear with no infiltrates.   ASSESSMENT AND PLAN: This is a 59 year old female with:  1.  Acute respiratory failure due to chronic obstructive pulmonary disease exacerbation: Oxygen saturations dropped to 85% to 86% on room air at rest. Continue oxygen 2 to 3 liters to keep saturations above 95. Admit to hospitalist service on telemetry. Continue intravenous Solu-Medrol and Rocephin, Zithromax, DuoNebs, and see how she does.  2.  Hypertension: Continue home medications including lisinopril and HCTZ.  3.  Tobacco abuse: Counseled about quitting. The patient is in the process, so we will continue Spiriva along with her nebulizer and Singulair.  4.  Hyperlipidemia: Continue simvastatin 40 mg orally at bedtime. Discussed the plan with the patient.   TIME SPENT: About 55 minutes.    ____________________________ Epifanio Lesches, MD sk:jm D: 06/15/2013 16:59:00 ET T: 06/15/2013 17:34:38 ET JOB#: [id number]  cc: Epifanio Lesches, MD, <Dictator> Epifanio Lesches MD ELECTRONICALLY SIGNED 07/06/2013 17:00

## 2015-03-11 NOTE — H&P (Signed)
PATIENT NAME:  [name], [name] MR#:  [id number] DATE OF BIRTH:  03/18/1956  DATE OF ADMISSION:  10/26/2013  ADMITTING PHYSICIAN: Gladstone Lighter, MD   PRIMARY CARE PHYSICIAN: At [facility].   CHIEF COMPLAINT: Difficulty breathing.   HISTORY OF PRESENT ILLNESS: [name] is a 59 year old Caucasian female with past medical history significant for oxygen-dependent COPD, on 2 liters home oxygen, hypertension, hyperlipidemia, and ongoing smoking with recent admissions twice in the last month for COPD exacerbation, who presents secondary to difficulty breathing that started yesterday. The patient was discharged last on October 10, 2013, when she was admitted for a tension headache and mild elevation in troponin secondary to demand ischemia. After discharge in the last 2 weeks, the patient has been seen by Dr. Raul Del and also her PCP as an outpatient and was doing fine. Over the past week, her cough was getting more congested. She denies any fevers and chills up until yesterday, when she started to have some chills and worsening dyspnea on exertion. Her  breathing got much more worse today and was brought to the hospital. Chest x-ray revealed a new left lower lobe infiltrate and she was having significant wheezing on exam, and so she is being admitted for COPD exacerbation and pneumonia.   PAST MEDICAL HISTORY: 1.  Chronic COPD, on 2 liters home oxygen continuous. 2.  Hypertension.  3.  Hyperlipidemia.  4.  Recent admission to the hospital last month twice, once for COPD, and the other was for elevated troponin from demand ischemia.   PAST SURGICAL HISTORY: None.  ALLERGIES TO MEDICATIONS: No known drug allergies.   CURRENT HOME MEDICATIONS:  1.  Simvastatin 40 mg p.o. daily.  2.  Atrovent inhaler 2 puffs 4 times a day as needed.  3.  Proventil inhaler 2 puffs 4 times a day as needed. 4.  Dulera 2 puffs twice a day. 5.  Xopenex nebulizer q.4 hours p.r.n. for shortness of breath.  6.  Percocet  p.r.n. for pain.  7.  Aspirin 81 mg p.o. daily.  8.  Home oxygen 2 liters.   SOCIAL HISTORY: Lives at home with her boyfriend. Continues to smoke, down to 6 cigarettes per day. Denies any alcohol or drug use.   FAMILY HISTORY: Dad with emphysema and also heart disease.  REVIEW OF SYSTEMS:   CONSTITUTIONAL: No fever. Positive for chills. No fatigue or weakness.  EYES: No blurred vision, double vision, pain, inflammation, glaucoma or cataracts.  EARS, NOSE, THROAT: No tinnitus, ear pain, hearing loss, epistaxis or discharge.  RESPIRATORY: Positive for cough, wheeze, and also COPD. No hemoptysis.  CARDIOVASCULAR: No chest pain. Positive for orthopnea and dyspnea on exertion. No edema, arrhythmia, palpitations or syncope.  GASTROINTESTINAL: Positive for nausea. No vomiting, diarrhea, abdominal pain, hematemesis or melena.  GENITOURINARY: No dysuria, hematuria, renal calculus, frequency or incontinence.  ENDOCRINE: No polyuria, nocturia, thyroid problems, heat or cold intolerance.  HEMATOLOGY: No anemia, easy bruising or bleeding.  SKIN: No acne, rash or lesions.  MUSCULOSKELETAL: No neck, back, shoulder pain, arthritis or gout.  NEUROLOGIC: No numbness, weakness, CVA, TIA or seizures.  PSYCHOLOGICAL: No anxiety, insomnia or depression.   PHYSICAL EXAMINATION: VITAL SIGNS: Temperature 97.6 degrees Fahrenheit, pulse 130, respirations 22, blood pressure 111/63, pulse oximetry 92% on 2 liters oxygen.  GENERAL: Well-built, well-nourished female lying in bed, not in any acute distress.  HEENT: Normocephalic, atraumatic. Pupils equal, round, reacting to light. Anicteric sclerae. Extraocular movements intact. Oropharynx clear without erythema, mass or exudates.  NECK: Supple.  No thyromegaly, JVD or carotid bruits. No lymphadenopathy.  LUNGS: Moving air bilaterally. Lungs have significant expiratory wheeze bilaterally, more prominent anteriorly, and scant breath sounds posteriorly. No crackles heard.  No use of accessory muscles for breathing currently.  CARDIOVASCULAR: S1, S2, regular rate and rhythm. No murmurs, rubs or gallops.  ABDOMEN: Soft, nontender, nondistended. No hepatosplenomegaly. Normal bowel sounds.  EXTREMITIES: No pedal edema, no clubbing or cyanosis, 2+ dorsalis pedis pulses palpable bilaterally.  SKIN: No acne, rash or lesions.  LYMPHATICS: No cervical lymphadenopathy.  NEUROLOGIC: Cranial nerves II through XII remain intact. No gross motor or sensory abnormalities noted. Reflexes are intact, equal, symmetric bilaterally.  PSYCHOLOGICAL: The patient is awake, alert, oriented x 3.   LABORATORY, DIAGNOSTIC AND RADIOLOGICAL DATA: WBC 24.0, hemoglobin 11.9, hematocrit 35.9, platelet count 277.   Sodium 133, potassium 3.1, chloride 98, bicarbonate 28, BUN 6, creatinine 0.66, glucose 127 and calcium of 8.8.   ALT 16, AST 18, alkaline phosphatase 64, total bilirubin 0.4 and albumin of 3.5. CK 56, CK-MB 0.8, troponin less than 0.02.   Chest x-ray revealing COPD changes and left lower lobe pneumonia.   EKG showing sinus tachycardia, no acute ST-T wave abnormalities.   ASSESSMENT AND PLAN: This is a 59 year old female with history of chronic obstructive pulmonary disease, on 2 liters home oxygen, ongoing smoking, being admitted for chronic obstructive pulmonary disease exacerbation and also pneumonia.  1.  Acute on chronic, chronic obstructive pulmonary disease exacerbation: Started on IV steroids, Levaquin. Continue inhalers and nebulizer treatments.  2.  Systemic inflammatory response syndrome with tachycardia and also leukocytosis, likely from new left lower lobe infiltrate: Since the patient had recent hospitalization, will have to cover for her healthcare-acquired pneumonia. Blood cultures were drawn. Will start on IV antibiotics appropriately.  3.  Hypertension: Continue Norvasc.  4.  Hyperlipidemia: On statin.  5.  Tobacco use disorder: Counseled for 3 minutes The patient  refused nicotine patch at this time.  6.  Hypokalemia: Being replaced.  7.  Gastrointestinal and deep vein thrombosis prophylaxis.   CODE STATUS: Full code.   TIME SPENT ON ADMISSION:  39.   ____________________________ Gladstone Lighter, MD rk:jcm D: 10/26/2013 15:45:42 ET T: 10/26/2013 16:13:09 ET JOB#: [id number]  cc: Gladstone Lighter, MD, <Dictator> [place] Raul Del, MD Gladstone Lighter MD ELECTRONICALLY SIGNED 11/30/2013 11:25

## 2015-03-11 NOTE — Discharge Summary (Signed)
PATIENT NAME:  [name], SHIPLETT MR#:  [id number] DATE OF BIRTH:  Jul 22, 1956  DATE OF ADMISSION:  10/26/2013 DATE OF DISCHARGE:  11/02/2013  DISCHARGE DIAGNOSES: 1. Acute on chronic, chronic obstructive pulmonary disease exacerbation.  2. Systemic inflammatory response syndrome secondary to pneumonia, resolved.  3. Hypertension.  4. Hypokalemia.   DISCHARGE MEDICATIONS:  1. Atrovent 2 puffs 4 times daily.  2. Simvastatin 40 mg p.o. daily.  3. Proventil 90 mcg 2 puffs 4 times a day as needed for wheezing.  4. Xopenex 1.25 nebulizer every four hours.  5. Dulera 200/5 mcg inhalation 2 puffs b.i.d.  6. Amlodipine 5 mg p.o. daily.  7. Advair Diskus 250/50 one puff b.i.d.  8. Prednisone burst tapering 20 mg tablets 3 tablets daily for two days, 2 tablets daily for two days, then 1 tablet daily for two days and then stop.  9. Levaquin 500 mg daily for 10 days.   DIET: Low sodium diet.   Oxygen 2 liters via nasal cannula.   CONSULTATIONS: None.   HOSPITAL COURSE: This patient is a 59 year old female patient who has COPD on chronic oxygen 2 L, follows up with Dr. Raul Del, came in because of shortness of breath. The patient still has ongoing smoke tobacco history. The patient was recently discharged on 11/23 for COPD exacerbation, came in because of shortness of breath with worsening cough. The patient did not have any fever, before she came, but started to have some chills on the day of admission. Chest x-ray showed left lower lobe pneumonia. The patient was tachycardic on admission with heart rate of 130, temperature 97.6 and O2 sats 92% on 2 L.  Admitted for SIRS secondary to pneumonia. Started on IV fluids and also COPD exacerbation. She started to get Levaquin along with steroids, nebulizers. The patient received IV Levaquin, Zosyn and vancomycin during the hospital stay for healthcare-associated pneumonia. The patient's wheezing slowly resolved and repeat chest x-ray showed resolving pneumonia and  the patient's white count was 24 on 12/09 and blood culture did not show any growth. The patient's white count dropped to 13.7 on 12/12 and she remained afebrile. The patient will be going home with a tapering dose of prednisone and Levaquin, and she can followup with Dr. Raul Del. She also has nebulizers at home along with oxygen. She can continue. Advised to quit smoking.    . The patient's  chest x-ray is repeated 2 times and it did not show any consolidation on portable x-ray. The patient also complained of some blurred vision on and off, so we got carotid ultrasound, which did not show any acute changes and no significant stenosis. Sugars have been in the 140s, but hemoglobin A1c is 5.6. The patient likely had some blurred vision secondary to hyperglycemia due to steroids, and the patient says that this is ongoing before even she came to the hospital. Advised to follow up with her primary doctor.   DISCHARGE VITALS: Temperature 98.3, heart rate is around 95, blood pressure is 109/69 and sats 92% on 2 liters.  TIME SPENT ON DISCHARGE PREPARATION: More than 30 minutes.   She can follow up with Dr. Raul Del.   ____________________________ Epifanio Lesches, MD sk:sg D: 11/02/2013 13:38:44 ET T: 11/02/2013 14:14:40 ET JOB#: [id number]  cc: Epifanio Lesches, MD, <Dictator> Epifanio Lesches MD ELECTRONICALLY SIGNED 11/20/2013 17:20

## 2015-05-08 ENCOUNTER — Other Ambulatory Visit: Payer: Medicare HMO

## 2015-06-05 ENCOUNTER — Encounter
Admission: RE | Admit: 2015-06-05 | Discharge: 2015-06-05 | Disposition: A | Discharge status: Home / Self Care | Payer: Medicare HMO | Source: Ambulatory Visit | Attending: Podiatry | Admitting: Podiatry

## 2015-06-05 DIAGNOSIS — Z01810 Encounter for preprocedural cardiovascular examination: Secondary | ICD-10-CM | POA: Diagnosis present

## 2015-06-05 DIAGNOSIS — J449 Chronic obstructive pulmonary disease, unspecified: Secondary | ICD-10-CM | POA: Diagnosis not present

## 2015-06-05 LAB — CBC
HCT: 35.7 % (ref 35.0–47.0)
Hemoglobin: 11.6 g/dL — ABNORMAL LOW (ref 12.0–16.0)
MCH: 27.3 pg (ref 26.0–34.0)
MCHC: 32.4 g/dL (ref 32.0–36.0)
MCV: 84.3 fL (ref 80.0–100.0)
Platelets: 326 10*3/uL (ref 150–440)
RBC: 4.23 MIL/uL (ref 3.80–5.20)
RDW: 14.7 % — AB (ref 11.5–14.5)
WBC: 13.1 10*3/uL — AB (ref 3.6–11.0)

## 2015-06-05 LAB — DIFFERENTIAL
BASOS ABS: 0 10*3/uL (ref 0–0.1)
Basophils Relative: 0 %
Eosinophils Absolute: 0 10*3/uL (ref 0–0.7)
Eosinophils Relative: 0 %
LYMPHS ABS: 0.7 10*3/uL — AB (ref 1.0–3.6)
Lymphocytes Relative: 5 %
MONOS PCT: 3 %
Monocytes Absolute: 0.4 10*3/uL (ref 0.2–0.9)
Neutro Abs: 12 10*3/uL — ABNORMAL HIGH (ref 1.4–6.5)
Neutrophils Relative %: 92 %

## 2015-06-05 NOTE — Patient Instructions (Signed)
  Your procedure is scheduled on: 06/09/15 Report to Day Surgery. To find out your arrival time please call [phone] between 1PM - 3PM on 06/08/15 Thurs.  Remember: Instructions that are not followed completely may result in serious medical risk, up to and including death, or upon the discretion of your surgeon and anesthesiologist your surgery may need to be rescheduled.    __x__ 1. Do not eat food or drink liquids after midnight. No gum chewing or hard candies.     __x__ 2. No Alcohol for 24 hours before or after surgery.   ____ 3. Bring all medications with you on the day of surgery if instructed.    __x__ 4. Notify your doctor if there is any change in your medical condition     (cold, fever, infections).     Do not wear jewelry, make-up, hairpins, clips or nail polish.  Do not wear lotions, powders, or perfumes. You may wear deodorant.  Do not shave 48 hours prior to surgery. Men may shave face and neck.  Do not bring valuables to the hospital.    [facility] is not responsible for any belongings or valuables.               Contacts, dentures or bridgework may not be worn into surgery.  Leave your suitcase in the car. After surgery it may be brought to your room.  For patients admitted to the hospital, discharge time is determined by your                treatment team.   Patients discharged the day of surgery will not be allowed to drive home.   Please read over the following fact sheets that you were given:      _x___ Take these medicines the morning of surgery with A SIP OF WATER:    1. albuterol (PROVENTIL HFA;VENTOLIN HFA) 108 (90 BASE) MCG/ACT inhaler  2. ranitidine (ZANTAC) 150 MG capsule  3..   4.  5.  6.  ____ Fleet Enema (as directed)   __x__ Uxse CHG Soap as directed  __x__ Use inhalers on the day of surgery  ____ Stop metformin 2 days prior to surgery    ____ Take 1/2 of usual insulin dose the night before surgery and none on the morning of surgery.    ____ Stop Coumadin/Plavix/aspirin on  ____ Stop Anti-inflammatories on   ____ Stop supplements until after surgery.    ____ Bring C-Pap to the hospital.

## 2015-06-06 DIAGNOSIS — Z79899 Other long term (current) drug therapy: Secondary | ICD-10-CM | POA: Diagnosis not present

## 2015-06-06 DIAGNOSIS — Z87891 Personal history of nicotine dependence: Secondary | ICD-10-CM | POA: Diagnosis not present

## 2015-06-06 DIAGNOSIS — J449 Chronic obstructive pulmonary disease, unspecified: Secondary | ICD-10-CM | POA: Diagnosis not present

## 2015-06-06 DIAGNOSIS — Z9981 Dependence on supplemental oxygen: Secondary | ICD-10-CM | POA: Diagnosis not present

## 2015-06-06 DIAGNOSIS — M2011 Hallux valgus (acquired), right foot: Secondary | ICD-10-CM | POA: Diagnosis not present

## 2015-06-06 DIAGNOSIS — I10 Essential (primary) hypertension: Secondary | ICD-10-CM | POA: Diagnosis not present

## 2015-06-09 ENCOUNTER — Encounter
Admission: RE | Disposition: A | Discharge status: Home / Self Care | Payer: Self-pay | Source: Ambulatory Visit | Attending: Podiatry

## 2015-06-09 ENCOUNTER — Ambulatory Visit: Payer: Medicare HMO | Admitting: Certified Registered"

## 2015-06-09 ENCOUNTER — Encounter: Payer: Self-pay | Admitting: *Deleted

## 2015-06-09 ENCOUNTER — Ambulatory Visit
Admission: RE | Admit: 2015-06-09 | Discharge: 2015-06-09 | Disposition: A | Discharge status: Home / Self Care | Payer: Medicare HMO | Source: Ambulatory Visit | Attending: Podiatry | Admitting: Podiatry

## 2015-06-09 ENCOUNTER — Ambulatory Visit: Payer: Medicare HMO | Admitting: Anesthesiology

## 2015-06-09 DIAGNOSIS — M2011 Hallux valgus (acquired), right foot: Secondary | ICD-10-CM | POA: Diagnosis not present

## 2015-06-09 DIAGNOSIS — J449 Chronic obstructive pulmonary disease, unspecified: Secondary | ICD-10-CM | POA: Insufficient documentation

## 2015-06-09 DIAGNOSIS — Z79899 Other long term (current) drug therapy: Secondary | ICD-10-CM | POA: Insufficient documentation

## 2015-06-09 DIAGNOSIS — I10 Essential (primary) hypertension: Secondary | ICD-10-CM | POA: Insufficient documentation

## 2015-06-09 DIAGNOSIS — Z87891 Personal history of nicotine dependence: Secondary | ICD-10-CM | POA: Insufficient documentation

## 2015-06-09 DIAGNOSIS — Z9981 Dependence on supplemental oxygen: Secondary | ICD-10-CM | POA: Insufficient documentation

## 2015-06-09 HISTORY — PX: HALLUX VALGUS BASE WEDGE: SHX6624

## 2015-06-09 LAB — DIFFERENTIAL
BASOS PCT: 1 %
Basophils Absolute: 0.1 10*3/uL (ref 0–0.1)
EOS ABS: 0.3 10*3/uL (ref 0–0.7)
EOS PCT: 3 %
Lymphocytes Relative: 21 %
Lymphs Abs: 2.3 10*3/uL (ref 1.0–3.6)
MONO ABS: 1.1 10*3/uL — AB (ref 0.2–0.9)
Monocytes Relative: 10 %
NEUTROS PCT: 65 %
Neutro Abs: 6.9 10*3/uL — ABNORMAL HIGH (ref 1.4–6.5)

## 2015-06-09 LAB — CBC
HCT: 35.9 % (ref 35.0–47.0)
HEMOGLOBIN: 12 g/dL (ref 12.0–16.0)
MCH: 28.4 pg (ref 26.0–34.0)
MCHC: 33.4 g/dL (ref 32.0–36.0)
MCV: 85 fL (ref 80.0–100.0)
Platelets: 266 10*3/uL (ref 150–440)
RBC: 4.22 MIL/uL (ref 3.80–5.20)
RDW: 14.8 % — AB (ref 11.5–14.5)
WBC: 10.6 10*3/uL (ref 3.6–11.0)

## 2015-06-09 SURGERY — HALLUX VALGUS BASE WEDGE
Anesthesia: Monitor Anesthesia Care | Laterality: Right | Wound class: Clean

## 2015-06-09 MED ORDER — PROPOFOL INFUSION 10 MG/ML OPTIME
INTRAVENOUS | Status: DC | PRN
  Administered 2015-06-09: 75 ug/kg/min via INTRAVENOUS
  Administered 2015-06-09: 09:00:00 via INTRAVENOUS

## 2015-06-09 MED ORDER — LIDOCAINE HCL (CARDIAC) 20 MG/ML IV SOLN
INTRAVENOUS | Status: DC | PRN
  Administered 2015-06-09: 50 mg via INTRAVENOUS

## 2015-06-09 MED ORDER — SODIUM CHLORIDE 0.9 % IR SOLN
Status: DC | PRN
  Administered 2015-06-09: 250 mL

## 2015-06-09 MED ORDER — CEFAZOLIN SODIUM-DEXTROSE 2-3 GM-% IV SOLR
2.0000 g | Freq: Once | INTRAVENOUS | Status: AC
  Administered 2015-06-09: 2 g via INTRAVENOUS

## 2015-06-09 MED ORDER — BUPIVACAINE HCL 0.5 % IJ SOLN
INTRAMUSCULAR | Status: DC | PRN
  Administered 2015-06-09: 10 mL

## 2015-06-09 MED ORDER — ONDANSETRON HCL 4 MG/2ML IJ SOLN: 4.0000 mg | Freq: Once | INTRAMUSCULAR | Status: DC | PRN

## 2015-06-09 MED ORDER — LACTATED RINGERS IV SOLN
INTRAVENOUS | Status: DC
  Administered 2015-06-09 (×2): via INTRAVENOUS

## 2015-06-09 MED ORDER — FENTANYL CITRATE (PF) 100 MCG/2ML IJ SOLN: 25.0000 ug | INTRAMUSCULAR | Status: DC | PRN

## 2015-06-09 MED ORDER — BUPIVACAINE HCL (PF) 0.5 % IJ SOLN
INTRAMUSCULAR | Status: AC
  Filled 2015-06-09: qty 30

## 2015-06-09 MED ORDER — LIDOCAINE HCL (PF) 1 % IJ SOLN
INTRAMUSCULAR | Status: AC
  Filled 2015-06-09: qty 30

## 2015-06-09 MED ORDER — OXYCODONE-ACETAMINOPHEN 5-325 MG PO TABS: 1.0000 | ORAL_TABLET | ORAL | Status: DC | PRN

## 2015-06-09 MED ORDER — MIDAZOLAM HCL 2 MG/2ML IJ SOLN
INTRAMUSCULAR | Status: DC | PRN
  Administered 2015-06-09: 2 mg via INTRAVENOUS

## 2015-06-09 MED ORDER — GLYCOPYRROLATE 0.2 MG/ML IJ SOLN
INTRAMUSCULAR | Status: DC | PRN
  Administered 2015-06-09: 0.2 mg via INTRAVENOUS

## 2015-06-09 MED ORDER — FENTANYL CITRATE (PF) 100 MCG/2ML IJ SOLN
INTRAMUSCULAR | Status: DC | PRN
  Administered 2015-06-09 (×2): 25 ug via INTRAVENOUS
  Administered 2015-06-09: 50 ug via INTRAVENOUS

## 2015-06-09 MED ORDER — CEFAZOLIN SODIUM-DEXTROSE 2-3 GM-% IV SOLR
INTRAVENOUS | Status: AC
  Administered 2015-06-09: 2 g via INTRAVENOUS
  Filled 2015-06-09: qty 50

## 2015-06-09 MED ORDER — PHENYLEPHRINE HCL 10 MG/ML IJ SOLN
INTRAMUSCULAR | Status: DC | PRN
  Administered 2015-06-09: 50 ug via INTRAVENOUS
  Administered 2015-06-09: 100 ug via INTRAVENOUS
  Administered 2015-06-09: 50 ug via INTRAVENOUS
  Administered 2015-06-09 (×4): 100 ug via INTRAVENOUS
  Administered 2015-06-09: 50 ug via INTRAVENOUS
  Administered 2015-06-09 (×2): 100 ug via INTRAVENOUS
  Administered 2015-06-09: 50 ug via INTRAVENOUS

## 2015-06-09 SURGICAL SUPPLY — 47 items
BAG COUNTER SPONGE EZ (MISCELLANEOUS) IMPLANT
BANDAGE ELASTIC 4 CLIP NS LF (GAUZE/BANDAGES/DRESSINGS) ×6 IMPLANT
BANDAGE STRETCH 3X4.1 STRL (GAUZE/BANDAGES/DRESSINGS) ×3 IMPLANT
BENZOIN TINCTURE PRP APPL 2/3 (GAUZE/BANDAGES/DRESSINGS) ×3 IMPLANT
BLADE MED AGGRESSIVE (BLADE) ×3 IMPLANT
BLADE OSC/SAGITTAL MD 5.5X18 (BLADE) ×3 IMPLANT
BLADE SURG 15 STRL LF DISP TIS (BLADE) ×2 IMPLANT
BLADE SURG 15 STRL SS (BLADE) ×4
BLADE SURG MINI STRL (BLADE) ×3 IMPLANT
BNDG ESMARK 4X12 TAN STRL LF (GAUZE/BANDAGES/DRESSINGS) ×3 IMPLANT
BNDG GAUZE 4.5X4.1 6PLY STRL (MISCELLANEOUS) ×3 IMPLANT
BNDG STRETCH 4X75 STRL LF (GAUZE/BANDAGES/DRESSINGS) ×3 IMPLANT
CANISTER SUCT 1200ML W/VALVE (MISCELLANEOUS) ×3 IMPLANT
CLOSURE WOUND 1/4X4 (GAUZE/BANDAGES/DRESSINGS) ×1
COUNTER SPONGE BAG EZ (MISCELLANEOUS)
CUFF TOURN 18 STER (MISCELLANEOUS) IMPLANT
CUFF TOURN DUAL PL 12 NO SLV (MISCELLANEOUS) ×3 IMPLANT
DRAPE FLUOR MINI C-ARM 54X84 (DRAPES) ×3 IMPLANT
DURAPREP 26ML APPLICATOR (WOUND CARE) ×3 IMPLANT
GAUZE PETRO XEROFOAM 1X8 (MISCELLANEOUS) ×3 IMPLANT
GAUZE SPONGE 4X4 12PLY STRL (GAUZE/BANDAGES/DRESSINGS) ×3 IMPLANT
GAUZE STRETCH 2X75IN STRL (MISCELLANEOUS) ×3 IMPLANT
GLOVE BIO SURGEON STRL SZ7.5 (GLOVE) ×6 IMPLANT
GLOVE INDICATOR 8.0 STRL GRN (GLOVE) ×6 IMPLANT
GOWN STRL REUS W/ TWL LRG LVL3 (GOWN DISPOSABLE) ×2 IMPLANT
GOWN STRL REUS W/TWL LRG LVL3 (GOWN DISPOSABLE) ×4
LABEL OR SOLS (LABEL) ×3 IMPLANT
NDL SAFETY 25GX1.5 (NEEDLE) ×6 IMPLANT
NEEDLE FILTER BLUNT 18X 1/2SAF (NEEDLE) ×2
NEEDLE FILTER BLUNT 18X1 1/2 (NEEDLE) ×1 IMPLANT
NS IRRIG 1000ML POUR BTL (IV SOLUTION) ×3 IMPLANT
PACK EXTREMITY ARMC (MISCELLANEOUS) ×3 IMPLANT
PAD CAST CTTN 4X4 STRL (SOFTGOODS) ×1 IMPLANT
PAD GROUND ADULT SPLIT (MISCELLANEOUS) ×3 IMPLANT
PADDING CAST COTTON 4X4 STRL (SOFTGOODS) ×2
PENCIL ELECTRO HAND CTR (MISCELLANEOUS) IMPLANT
SPLINT CAST 1 STEP 4X30 (MISCELLANEOUS) ×3 IMPLANT
STAPLE NIT 15X12X10.9MMX1.5 (Staple) ×6 IMPLANT
STAPLE NIT SUPER 13X10X10 (Staple) ×6 IMPLANT
STRAP SAFETY BODY (MISCELLANEOUS) ×3 IMPLANT
STRIP CLOSURE SKIN 1/4X4 (GAUZE/BANDAGES/DRESSINGS) ×2 IMPLANT
SUT VIC AB 4-0 FS2 27 (SUTURE) ×3 IMPLANT
SUT VICRYL 3-0 RB1 18 ABS (SUTURE) ×3 IMPLANT
SWABSTK COMLB BENZOIN TINCTURE (MISCELLANEOUS) ×3 IMPLANT
SYRINGE 10CC LL (SYRINGE) ×3 IMPLANT
WIRE Z .045 C-WIRE SPADE TIP (WIRE) IMPLANT
WIRE Z .062 C-WIRE SPADE TIP (WIRE) IMPLANT

## 2015-06-09 NOTE — Op Note (Signed)
Date of operation: 06/09/2015.  Surgeon: Durward Fortes DPM.  Preoperative diagnosis: Hallux valgus right foot.  Postoperative diagnosis: Same.  Procedure: 1. Base wedge osteotomy right first metatarsal with stable fixation.                     2. Modified McBride hallux valgus correction right first metatarsophalangeal joint.  Anesthesia: Local Mac.  Hemostasis: Pneumatic tourniquet right ankle 250 mmHg.  Estimated blood loss: Minimal.  Pathology: None.  Materials: Compression staples 13 mm and 15 mm lengths.  Complications: None apparent.  Operative indications: This is a 59 year old female with a long-term history of a painful bunion on her right foot. Patient elects for surgical correction of the deformity.  Operative procedure: Patient was taken to the operating room and placed on the table in the supine position. Following satisfactory sedation the right foot was anesthetized with 10 cc of 0.5% bupivacaine plain around the first metatarsal base. A pneumatic tourniquet was applied at the level of the right ankle and the foot was prepped and draped in the usual sterile fashion. The foot was exsanguinated and the tourniquet inflated to 250 mmHg.   Attention was then directed to the dorsal aspect of the right foot where an approximate 4 cm linear incision was made coursing proximal to distal over the metatarsal and metatarsophalangeal joint. Incision was deepened via sharp and blunt dissection down to the level of the joint where a linear capsulotomy was performed. The capsular and periosteal tissues reflected off of the dorsal and medial head of the first metatarsal. Some degenerative bone and prominence was noted medially which was resected using a bone saw. Attention was then directed to the lateral aspect of the incision where this was deepened via sharp and blunt dissection into the first interspace. A lateral capsulotomy was performed with freeing of the sesamoid apparatus and release  of the intermetatarsal ligament and abductor tendon insertion. The wound was flushed with copious amounts of sterile saline. Attention was then directed more proximally where an additional 6 cm linear incision was made coursing proximal to distal over the base of the first metatarsal connecting with the earlier incision. This was deepened via sharp and blunt dissection down to the level of the metatarsal base where a linear periosteal incision was made and the periosteal tissues reflected off of the base of the first metatarsal. A base wedge osteotomy was then performed using the bone saw with the base lateral and apex medial with care taken to keep the medial hinge intact. The osteotomy was then feathered down to close in the osteotomy and realignment first metatarsal. Intraoperative FluoroScan views revealed good reduction of the first intermetatarsal angle with good coaptation at the osteotomy. A 0.045 inch K wire was then driven for stabilization of the osteotomy. Next a 15 mm bone compression staple was then placed medially using standard insertion technique. Intraoperative FluoroScan views revealed good placement of the staple. 15 mm staple was then placed dorsal lateral and an approximate 90 angle to the first staple. The K wire was removed. The second staple was then inserted using standard technique. Intraoperative FluoroScan views revealed good reduction of the osteotomy with good alignment of the first ray and placement of both staples. Attention was then directed back to the distal aspect around the first metatarsal head where there was noted to be some redundant capsular material from the chronic deformity. A medial capsulorrhaphy was performed and this was closed using 305 oral figure-of-eight sutures and simple  interrupted sutures. The wound was then flushed with copious amounts of sterile saline and the entire incision was closed using 405 oral running suture for all layers for periosteal and  capsular closure followed by deep and superficial subcutaneous followed by skin closure. Tincture of benzoin and Steri-Strips applied followed by a sterile gauze bandage. The tourniquet was released and blood flow noted to return immediately to the right foot and digits 1 through 5. A fiberglass posterior splint was then applied to the right lower extremity with the foot 90 relative to the leg. The patient tolerated the procedure and anesthesia well and was transported to the PACU with vital signs stable and in good condition.

## 2015-06-09 NOTE — H&P (Signed)
  Patient's medical H&P in the chart was reviewed. Repeat blood work this morning stable as far as her white count. Patient is stable for surgery

## 2015-06-09 NOTE — Progress Notes (Signed)
Ace wrap to right foot with splint dry and intact

## 2015-06-09 NOTE — Anesthesia Postprocedure Evaluation (Signed)
  Anesthesia Post-op Note  Patient: [name]  Procedure(s) Performed: Procedure(s): Base wedge osteotomy with modified McBride right foot  (Right)  Anesthesia type:MAC  Patient location: PACU  Post pain: Pain level controlled  Post assessment: Post-op Vital signs reviewed, Patient's Cardiovascular Status Stable, Respiratory Function Stable, Patent Airway and No signs of Nausea or vomiting  Post vital signs: Reviewed and stable  Last Vitals:  Filed Vitals:   06/09/15 1049  BP: 148/63  Pulse: 78  Temp:   Resp: 20    Level of consciousness: awake, alert  and patient cooperative  Complications: No apparent anesthesia complications

## 2015-06-09 NOTE — Discharge Instructions (Addendum)
1. Elevate right leg on 2 pillows.  2. Keep the bandage clean, dry, and do not remove it  3. Sponge bathe only right lower extremity.  4. No weight on right foot using crutches or wheelchair.  5. Take one pain pill, oxycodone, every 4 hours as needed for pain.AMBULATORY SURGERY  DISCHARGE INSTRUCTIONS   1) The drugs that you were given will stay in your system until tomorrow so for the next 24 hours you should not:  A) Drive an automobile B) Make any legal decisions C) Drink any alcoholic beverage   2) You may resume regular meals tomorrow.  Today it is better to start with liquids and gradually work up to solid foods.  You may eat anything you prefer, but it is better to start with liquids, then soup and crackers, and gradually work up to solid foods.   3) Please notify your doctor immediately if you have any unusual bleeding, trouble breathing, redness and pain at the surgery site, drainage, fever, or pain not relieved by medication.    4) Additional Instructions:        Please contact your physician with any problems or Same Day Surgery at [phone], Monday through Friday 6 am to 4 pm, or [place] at [facility] number at [phone].

## 2015-06-09 NOTE — Transfer of Care (Signed)
Immediate Anesthesia Transfer of Care Note  Patient: [name]  Procedure(s) Performed: Procedure(s): Base wedge osteotomy with modified McBride right foot  (Right)  Patient Location: PACU  Anesthesia Type:MAC  Level of Consciousness: awake and alert   Airway & Oxygen Therapy: Patient Spontanous Breathing and Patient connected to nasal cannula oxygen  Post-op Assessment: Report given to RN  Post vital signs: Reviewed  Last Vitals:  Filed Vitals:   06/09/15 0957  BP: 132/79  Pulse: 73  Temp: 36.5 C  Resp: 14    Complications: No apparent anesthesia complications

## 2015-06-09 NOTE — Anesthesia Preprocedure Evaluation (Signed)
Anesthesia Evaluation  Patient identified by MRN, date of birth, ID band Patient awake    Reviewed: Allergy & Precautions, NPO status , Patient's Chart, lab work & pertinent test results  Airway Mallampati: I       Dental  (+) Upper Dentures, Lower Dentures   Pulmonary COPD COPD inhaler and oxygen dependent, former smoker,    + decreased breath sounds      Cardiovascular Exercise Tolerance: Poor hypertension, Rhythm:Regular Rate:Normal     Neuro/Psych    GI/Hepatic   Endo/Other    Renal/GU      Musculoskeletal   Abdominal Normal abdominal exam  (+)   Peds  Hematology   Anesthesia Other Findings   Reproductive/Obstetrics                             Anesthesia Physical Anesthesia Plan  ASA: IV  Anesthesia Plan: MAC   Post-op Pain Management:    Induction: Intravenous  Airway Management Planned: Nasal Cannula  Additional Equipment:   Intra-op Plan:   Post-operative Plan:   Informed Consent: I have reviewed the patients History and Physical, chart, labs and discussed the procedure including the risks, benefits and alternatives for the proposed anesthesia with the patient or authorized representative who has indicated his/her understanding and acceptance.     Plan Discussed with: CRNA  Anesthesia Plan Comments:         Anesthesia Quick Evaluation

## 2015-06-09 NOTE — Interval H&P Note (Signed)
History and Physical Interval Note:  06/09/2015 7:41 AM  [name]  has presented today for surgery, with the diagnosis of HALLUX VALGUS  The various methods of treatment have been discussed with the patient and family. After consideration of risks, benefits and other options for treatment, the patient has consented to  Procedure(s): [place] (Right) as a surgical intervention .  The patient's history has been reviewed, patient examined, no change in status, stable for surgery.  I have reviewed the patient's chart and labs.  Questions were answered to the patient's satisfaction.     Hoover Grewe W.

## 2015-06-24 ENCOUNTER — Emergency Department (HOSPITAL_COMMUNITY): Payer: Medicare HMO

## 2015-06-24 ENCOUNTER — Encounter (HOSPITAL_COMMUNITY): Payer: Self-pay | Admitting: Emergency Medicine

## 2015-06-24 ENCOUNTER — Emergency Department (HOSPITAL_COMMUNITY)
Admission: EM | Admit: 2015-06-24 | Discharge: 2015-06-24 | Disposition: A | Discharge status: Home / Self Care | Payer: Medicare HMO | Attending: Emergency Medicine | Admitting: Emergency Medicine

## 2015-06-24 DIAGNOSIS — Z7951 Long term (current) use of inhaled steroids: Secondary | ICD-10-CM | POA: Insufficient documentation

## 2015-06-24 DIAGNOSIS — R0602 Shortness of breath: Secondary | ICD-10-CM | POA: Diagnosis present

## 2015-06-24 DIAGNOSIS — J159 Unspecified bacterial pneumonia: Secondary | ICD-10-CM | POA: Insufficient documentation

## 2015-06-24 DIAGNOSIS — R112 Nausea with vomiting, unspecified: Secondary | ICD-10-CM | POA: Diagnosis not present

## 2015-06-24 DIAGNOSIS — Z79899 Other long term (current) drug therapy: Secondary | ICD-10-CM | POA: Diagnosis not present

## 2015-06-24 DIAGNOSIS — E780 Pure hypercholesterolemia: Secondary | ICD-10-CM | POA: Insufficient documentation

## 2015-06-24 DIAGNOSIS — Z87891 Personal history of nicotine dependence: Secondary | ICD-10-CM | POA: Diagnosis not present

## 2015-06-24 DIAGNOSIS — R197 Diarrhea, unspecified: Secondary | ICD-10-CM | POA: Insufficient documentation

## 2015-06-24 DIAGNOSIS — R1110 Vomiting, unspecified: Secondary | ICD-10-CM

## 2015-06-24 DIAGNOSIS — J189 Pneumonia, unspecified organism: Secondary | ICD-10-CM

## 2015-06-24 DIAGNOSIS — I10 Essential (primary) hypertension: Secondary | ICD-10-CM | POA: Diagnosis not present

## 2015-06-24 DIAGNOSIS — J441 Chronic obstructive pulmonary disease with (acute) exacerbation: Secondary | ICD-10-CM | POA: Insufficient documentation

## 2015-06-24 LAB — CBC WITH DIFFERENTIAL/PLATELET
BASOS PCT: 0 % (ref 0–1)
Basophils Absolute: 0 10*3/uL (ref 0.0–0.1)
EOS ABS: 0.1 10*3/uL (ref 0.0–0.7)
EOS PCT: 1 % (ref 0–5)
HCT: 36.3 % (ref 36.0–46.0)
HEMOGLOBIN: 11.7 g/dL — AB (ref 12.0–15.0)
Lymphocytes Relative: 5 % — ABNORMAL LOW (ref 12–46)
Lymphs Abs: 0.6 10*3/uL — ABNORMAL LOW (ref 0.7–4.0)
MCH: 28.2 pg (ref 26.0–34.0)
MCHC: 32.2 g/dL (ref 30.0–36.0)
MCV: 87.5 fL (ref 78.0–100.0)
MONOS PCT: 6 % (ref 3–12)
Monocytes Absolute: 0.7 10*3/uL (ref 0.1–1.0)
NEUTROS ABS: 10.4 10*3/uL — AB (ref 1.7–7.7)
Neutrophils Relative %: 88 % — ABNORMAL HIGH (ref 43–77)
PLATELETS: 265 10*3/uL (ref 150–400)
RBC: 4.15 MIL/uL (ref 3.87–5.11)
RDW: 13.6 % (ref 11.5–15.5)
WBC: 11.8 10*3/uL — AB (ref 4.0–10.5)

## 2015-06-24 LAB — BASIC METABOLIC PANEL
Anion gap: 10 (ref 5–15)
BUN: 12 mg/dL (ref 6–20)
CALCIUM: 8.9 mg/dL (ref 8.9–10.3)
CHLORIDE: 98 mmol/L — AB (ref 101–111)
CO2: 28 mmol/L (ref 22–32)
CREATININE: 0.71 mg/dL (ref 0.44–1.00)
GFR calc Af Amer: >60 mL/min (ref >60)
Glucose, Bld: 124 mg/dL — ABNORMAL HIGH (ref 65–99)
POTASSIUM: 3.9 mmol/L (ref 3.5–5.1)
Sodium: 136 mmol/L (ref 135–145)

## 2015-06-24 LAB — TROPONIN I

## 2015-06-24 MED ORDER — ONDANSETRON HCL 8 MG PO TABS: 8.0000 mg | ORAL_TABLET | Freq: Three times a day (TID) | ORAL | Status: DC | PRN

## 2015-06-24 MED ORDER — IOHEXOL 350 MG/ML SOLN
100.0000 mL | Freq: Once | INTRAVENOUS | Status: AC | PRN
  Administered 2015-06-24: 100 mL via INTRAVENOUS

## 2015-06-24 MED ORDER — SODIUM CHLORIDE 0.9 % IV BOLUS (SEPSIS)
1000.0000 mL | Freq: Once | INTRAVENOUS | Status: AC
  Administered 2015-06-24: 1000 mL via INTRAVENOUS

## 2015-06-24 MED ORDER — IBUPROFEN 800 MG PO TABS
800.0000 mg | ORAL_TABLET | Freq: Once | ORAL | Status: AC
  Administered 2015-06-24: 800 mg via ORAL
  Filled 2015-06-24: qty 1

## 2015-06-24 MED ORDER — ONDANSETRON HCL 4 MG/2ML IJ SOLN
4.0000 mg | Freq: Once | INTRAMUSCULAR | Status: AC
  Administered 2015-06-24: 4 mg via INTRAVENOUS
  Filled 2015-06-24: qty 2

## 2015-06-24 MED ORDER — AZITHROMYCIN 250 MG PO TABS: ORAL_TABLET | ORAL | Status: DC

## 2015-06-24 MED ORDER — DEXTROSE 5 % IV SOLN
1.0000 g | Freq: Once | INTRAVENOUS | Status: AC
  Administered 2015-06-24: 1 g via INTRAVENOUS
  Filled 2015-06-24: qty 10

## 2015-06-24 NOTE — ED Notes (Signed)
C/o SOB since yesterday with n/v/d.  C/o pain right lung, rates pain 8/10.

## 2015-06-24 NOTE — ED Notes (Signed)
Motrin given for headache -- Ace bandage removed from foot- Split removed - and inner dressing and foot examined- No swelling, redness, odor or drainage noted - Pa Triplett informed , stated that she felt comfortable with nurses assessment and ok given to reapply splint and outer ace wrap-

## 2015-06-24 NOTE — Discharge Instructions (Signed)
Pneumonia Pneumonia is an infection of the lungs.  CAUSES Pneumonia may be caused by bacteria or a virus. Usually, these infections are caused by breathing infectious particles into the lungs (respiratory tract). SIGNS AND SYMPTOMS   Cough.  Fever.  Chest pain.  Increased rate of breathing.  Wheezing.  Mucus production. DIAGNOSIS  If you have the common symptoms of pneumonia, your health care provider will typically confirm the diagnosis with a chest X-ray. The X-ray will show an abnormality in the lung (pulmonary infiltrate) if you have pneumonia. Other tests of your blood, urine, or sputum may be done to find the specific cause of your pneumonia. Your health care provider may also do tests (blood gases or pulse oximetry) to see how well your lungs are working. TREATMENT  Some forms of pneumonia may be spread to other people when you cough or sneeze. You may be asked to wear a mask before and during your exam. Pneumonia that is caused by bacteria is treated with antibiotic medicine. Pneumonia that is caused by the influenza virus may be treated with an antiviral medicine. Most other viral infections must run their course. These infections will not respond to antibiotics.  HOME CARE INSTRUCTIONS   Cough suppressants may be used if you are losing too much rest. However, coughing protects you by clearing your lungs. You should avoid using cough suppressants if you can.  Your health care provider may have prescribed medicine if he or she thinks your pneumonia is caused by bacteria or influenza. Finish your medicine even if you start to feel better.  Your health care provider may also prescribe an expectorant. This loosens the mucus to be coughed up.  Take medicines only as directed by your health care provider.  Do not smoke. Smoking is a common cause of bronchitis and can contribute to pneumonia. If you are a smoker and continue to smoke, your cough may last several weeks after your  pneumonia has cleared.  A cold steam vaporizer or humidifier in your room or home may help loosen mucus.  Coughing is often worse at night. Sleeping in a semi-upright position in a recliner or using a couple pillows under your head will help with this.  Get rest as you feel it is needed. Your body will usually let you know when you need to rest. PREVENTION A pneumococcal shot (vaccine) is available to prevent a common bacterial cause of pneumonia. This is usually suggested for:  People over 65 years old.  Patients on chemotherapy.  People with chronic lung problems, such as bronchitis or emphysema.  People with immune system problems. If you are over 65 or have a high risk condition, you may receive the pneumococcal vaccine if you have not received it before. In some countries, a routine influenza vaccine is also recommended. This vaccine can help prevent some cases of pneumonia.You may be offered the influenza vaccine as part of your care. If you smoke, it is time to quit. You may receive instructions on how to stop smoking. Your health care provider can provide medicines and counseling to help you quit. SEEK MEDICAL CARE IF: You have a fever. SEEK IMMEDIATE MEDICAL CARE IF:   Your illness becomes worse. This is especially true if you are elderly or weakened from any other disease.  You cannot control your cough with suppressants and are losing sleep.  You begin coughing up blood.  You develop pain which is getting worse or is uncontrolled with medicines.  Any of the symptoms   which initially brought you in for treatment are getting worse rather than better.  You develop shortness of breath or chest pain. MAKE SURE YOU:   Understand these instructions.  Will watch your condition.  Will get help right away if you are not doing well or get worse. Document Released: 11/04/2005 Document Revised: 03/21/2014 Document Reviewed: 01/24/2011 [facility] Patient Information 2015  [place], [place]. This information is not intended to replace advice given to you by your health care provider. Make sure you discuss any questions you have with your health care provider.  Nausea and Vomiting Nausea means you feel sick to your stomach. Throwing up (vomiting) is a reflex where stomach contents come out of your mouth. HOME CARE   Take medicine as told by your doctor.  Do not force yourself to eat. However, you do need to drink fluids.  If you feel like eating, eat a normal diet as told by your doctor.  Eat rice, wheat, potatoes, bread, lean meats, yogurt, fruits, and vegetables.  Avoid high-fat foods.  Drink enough fluids to keep your pee (urine) clear or pale yellow.  Ask your doctor how to replace body fluid losses (rehydrate). Signs of body fluid loss (dehydration) include:  Feeling very thirsty.  Dry lips and mouth.  Feeling dizzy.  Dark pee.  Peeing less than normal.  Feeling confused.  Fast breathing or heart rate. GET HELP RIGHT AWAY IF:   You have blood in your throw up.  You have black or bloody poop (stool).  You have a bad headache or stiff neck.  You feel confused.  You have bad belly (abdominal) pain.  You have chest pain or trouble breathing.  You do not pee at least once every 8 hours.  You have cold, clammy skin.  You keep throwing up after 24 to 48 hours.  You have a fever. MAKE SURE YOU:   Understand these instructions.  Will watch your condition.  Will get help right away if you are not doing well or get worse. Document Released: 04/22/2008 Document Revised: 01/27/2012 Document Reviewed: 04/05/2011 [facility] Patient Information 2015 [place], [place]. This information is not intended to replace advice given to you by your health care provider. Make sure you discuss any questions you have with your health care provider.

## 2015-06-24 NOTE — ED Provider Notes (Addendum)
Medical screening examination/treatment/procedure(s) were conducted as a shared visit with non-physician practitioner(s) and myself.  I personally evaluated the patient during the encounter.   EKG Interpretation   Date/Time:  Saturday June 24 2015 09:12:24 EDT Ventricular Rate:  118 PR Interval:  155 QRS Duration: 76 QT Interval:  316 QTC Calculation: 443 R Axis:   94 Text Interpretation:  Sinus tachycardia Consider right ventricular  hypertrophy Borderline T abnormalities, anterior leads Sinus tachycardia  Otherwise no significant change Confirmed by Rogene Houston  MD, Austine Wiedeman ([id number])  on 06/24/2015 9:17:09 AM      Results for orders placed or performed during the hospital encounter of 06/24/15  CBC with Differential  Result Value Ref Range   WBC 11.8 (H) 4.0 - 10.5 K/uL   RBC 4.15 3.87 - 5.11 MIL/uL   Hemoglobin 11.7 (L) 12.0 - 15.0 g/dL   HCT 36.3 36.0 - 46.0 %   MCV 87.5 78.0 - 100.0 fL   MCH 28.2 26.0 - 34.0 pg   MCHC 32.2 30.0 - 36.0 g/dL   RDW 13.6 11.5 - 15.5 %   Platelets 265 150 - 400 K/uL   Neutrophils Relative % 88 (H) 43 - 77 %   Lymphocytes Relative 5 (L) 12 - 46 %   Monocytes Relative 6 3 - 12 %   Eosinophils Relative 1 0 - 5 %   Basophils Relative 0 0 - 1 %   Neutro Abs 10.4 (H) 1.7 - 7.7 K/uL   Lymphs Abs 0.6 (L) 0.7 - 4.0 K/uL   Monocytes Absolute 0.7 0.1 - 1.0 K/uL   Eosinophils Absolute 0.1 0.0 - 0.7 K/uL   Basophils Absolute 0.0 0.0 - 0.1 K/uL   WBC Morphology MILD LEFT SHIFT (1-5% METAS, OCC MYELO, OCC BANDS)   Basic metabolic panel  Result Value Ref Range   Sodium 136 135 - 145 mmol/L   Potassium 3.9 3.5 - 5.1 mmol/L   Chloride 98 (L) 101 - 111 mmol/L   CO2 28 22 - 32 mmol/L   Glucose, Bld 124 (H) 65 - 99 mg/dL   BUN 12 6 - 20 mg/dL   Creatinine, Ser 0.71 0.44 - 1.00 mg/dL   Calcium 8.9 8.9 - 10.3 mg/dL   GFR calc non Af Amer >60 >60 mL/min   GFR calc Af Amer >60 >60 mL/min   Anion gap 10 5 - 15  Troponin I  Result Value Ref Range   Troponin I  <0.03 <0.031 ng/mL   Ct Angio Chest Pe W/cm &/or Wo Cm  06/24/2015   CLINICAL DATA:  Shortness of breath for 1 day. History of COPD and right foot surgery 2 weeks ago. Evaluate for pulmonary embolism. Initial encounter.  EXAM: CT ANGIOGRAPHY CHEST WITH CONTRAST  TECHNIQUE: Multidetector CT imaging of the chest was performed using the standard protocol during bolus administration of intravenous contrast. Multiplanar CT image reconstructions and MIPs were obtained to evaluate the vascular anatomy.  CONTRAST:  127mL OMNIPAQUE IOHEXOL 350 MG/ML SOLN  COMPARISON:  Radiographs 06/24/2015.  CT 07/19/2014.  FINDINGS: Mediastinum: The pulmonary arteries are adequately opacified with contrast. There is no evidence of acute pulmonary embolism. There is atherosclerosis of the aorta, great vessels and coronary arteries. There are small lymph nodes within the AP window and right paratracheal stations which have mildly enlarged compared with the prior examination, measuring 8 mm on image 32 and 7 mm on image 35, respectively. These remain within normal limits by size criteria. No pathologically enlarged lymph nodes demonstrated. There is  a stable small hiatal hernia.  Lungs/Pleura: There is no pleural effusion.Moderate to severe centrilobular emphysema again noted with biapical scarring. The previously demonstrated nodular density in the left upper lobe has resolved. However, there are multiple new ill-defined nodular densities throughout the left lung. The largest of these are in the left upper lobe, measuring 11 mm on image 21 and 17 mm on image 25. There are multiple other smaller ill-defined nodular densities throughout the lingula and left lower lobe. Some of these have the a tree-in-bud distribution. There is associated central airway thickening.  Upper abdomen: Stable without acute findings.  Musculoskeletal/Chest wall: No chest wall lesion or acute osseous findings.Old rib fractures noted on the left.  Review of the MIP  images confirms the above findings.  IMPRESSION: 1. No evidence of acute pulmonary embolism. 2. Interval development of ill-defined nodularity throughout the left lung. Appearance is most consistent with an inflammatory process, likely infectious. If the patient has no signs or symptoms of acute infection, this could reflect chronic inflammation such as atypical mycobacterial infection. 3. Interval mild enlargement of small lymph nodes in the right paratracheal and AP window stations, nonspecific, although likely reactive. 4. Given the nodular components in the left lung, CT follow-up in 6 months recommended to exclude neoplasm.   Electronically Signed   By: Richardean Sale M.D.   On: 06/24/2015 12:30   Dg Chest Portable 1 View  06/24/2015   CLINICAL DATA:  Shortness of breath, chest tightness, nausea and vomiting.  EXAM: PORTABLE CHEST - 1 VIEW  COMPARISON:  02/17/2015  FINDINGS: COPD again identified. Atelectasis versus early infiltrate at the right lung base. Mild left basilar atelectasis present. No overt edema, pleural fluid or pneumothorax. No nodules are identified. The heart size and mediastinal contours are within normal limits.  IMPRESSION: COPD with atelectasis versus early infiltrate at the right lung base. Mild left basilar atelectasis.   Electronically Signed   By: Aletta Edouard M.D.   On: 06/24/2015 09:48    Patient's CT scan shows no evidence pulmonary embolus but most likely consistent with a pneumonia. Patient has had no recent hospitalizations this would be a community-acquired pneumonia. Will treat with IV Rocephin here in continue with a Z-Pak. Patient still slightly tachycardic but no evidence of any pulmonary embolus oxygen saturations on room air will need to be above 90% prior to discharge if they are patient could be discharged home with close follow-up.    Fredia Sorrow, MD 06/24/15 1328   Correction for above. Patient is on 2 L of oxygen at home all the time. Oxygen  saturation is or in the mid to low 90s. Overall patient feels well enough to go home. Patient is followed by primary care doctors in [place] as well as pulmonology in [place].   Fredia Sorrow, MD 06/24/15 [phone]

## 2015-06-24 NOTE — ED Notes (Signed)
Pt have foot surgery on Tuesday.  Not on any blood thinners.  SOB started yesterday.

## 2015-06-24 NOTE — ED Provider Notes (Signed)
CSN: [id number]     Arrival date & time 06/24/15  0848 History   First MD Initiated Contact with Patient 06/24/15 [phone]     Chief Complaint  Patient presents with  . Shortness of Breath     (Consider location/radiation/quality/duration/timing/severity/associated sxs/prior Treatment) HPI   [name] is a 59 y.o. female who presents to the Emergency Department complaining of cough, shortness of breath for one day.  Cough has been occasionally productive and associated with vomiting and diarrhea.  She describes pain to her right lung that's worse with deep breathing.  She reports hx of COPD and is on home O2 at 2L.  She states she has not tried to eat any food since yesterday.  She denies abdominal  Pain, fever, bloody sputum, back pain. Patient also reports having surgery on her right foot two weeks ago, but denies complications.  No hx of previous DVT/PE, or known cardiac dz.      Past Medical History  Diagnosis Date  . Hypertension   . High cholesterol   . COPD (chronic obstructive pulmonary disease)    Past Surgical History  Procedure Laterality Date  . Hallux valgus base wedge Right 06/09/2015    Procedure: Base wedge osteotomy with modified McBride right foot ;  Surgeon: Sharlotte Alamo, MD;  Location: [facility] ORS;  Service: Podiatry;  Laterality: Right;   History reviewed. No pertinent family history. History  Substance Use Topics  . Smoking status: Former Smoker    Quit date: 11/04/2013  . Smokeless tobacco: Not on file  . Alcohol Use: No   OB History    No data available     Review of Systems  Constitutional: Negative for fever, chills and appetite change.  HENT: Positive for congestion. Negative for sore throat and trouble swallowing.   Respiratory: Positive for cough and shortness of breath. Negative for chest tightness and wheezing.   Cardiovascular: Negative for chest pain.  Gastrointestinal: Positive for nausea, vomiting and diarrhea. Negative for abdominal pain.   Genitourinary: Negative for dysuria and decreased urine volume.  Musculoskeletal: Negative for myalgias and arthralgias.  Skin: Negative for rash.  Neurological: Negative for dizziness, weakness and numbness.  Hematological: Negative for adenopathy.  All other systems reviewed and are negative.     Allergies  Review of patient's allergies indicates no known allergies.  Home Medications   Prior to Admission medications   Medication Sig Start Date End Date Taking? Authorizing Provider  amLODipine (NORVASC) 5 MG tablet Take 5 mg by mouth at bedtime.   Yes Historical Provider, MD  ferrous sulfate 325 (65 FE) MG tablet Take 325 mg by mouth 2 (two) times daily with a meal.    Yes Historical Provider, MD  gabapentin (NEURONTIN) 300 MG capsule Take 300-600 mg by mouth 3 (three) times daily. 1cap in the am and 1 cap in the afternoon and 2cap at bedtime   Yes Historical Provider, MD  ipratropium (ATROVENT HFA) 17 MCG/ACT inhaler Inhale 2 puffs into the lungs every 4 (four) hours as needed for wheezing.   Yes Historical Provider, MD  levalbuterol (XOPENEX) 1.25 MG/3ML nebulizer solution Take 1.25 mg by nebulization every 6 (six) hours as needed for wheezing or shortness of breath.    Yes Historical Provider, MD  mometasone-formoterol (DULERA) 100-5 MCG/ACT AERO Inhale 2 puffs into the lungs 2 (two) times daily.   Yes Historical Provider, MD  mometasone-formoterol (DULERA) 200-5 MCG/ACT AERO Inhale 2 puffs into the lungs 2 (two) times daily.   Yes  Historical Provider, MD  OXYGEN Inhale 2 L into the lungs continuous.   Yes Historical Provider, MD  ranitidine (ZANTAC) 150 MG tablet Take 150 mg by mouth 2 (two) times daily.   Yes Historical Provider, MD  simvastatin (ZOCOR) 40 MG tablet Take 40 mg by mouth at bedtime.   Yes Historical Provider, MD  tiotropium (SPIRIVA) 18 MCG inhalation capsule Place 18 mcg into inhaler and inhale daily.   Yes Historical Provider, MD  albuterol (PROVENTIL HFA;VENTOLIN  HFA) 108 (90 BASE) MCG/ACT inhaler Inhale 4-6 puffs into the lungs every 6 (six) hours as needed for wheezing or shortness of breath.    Historical Provider, MD  oxyCODONE-acetaminophen (ROXICET) 5-325 MG per tablet Take 1-2 tablets by mouth every 4 (four) hours as needed for severe pain. Patient not taking: Reported on 06/24/2015 06/09/15   Sharlotte Alamo, MD  predniSONE (DELTASONE) 50 MG tablet Take one tablet daily Patient not taking: Reported on 06/09/2015 02/17/15   Ashley Murrain, NP  Vitamin D, Ergocalciferol, (DRISDOL) 50000 UNITS CAPS capsule Take 50,000 Units by mouth every 30 (thirty) days.    Historical Provider, MD   BP 120/68 mmHg  Pulse 107  Temp(Src) 99.6 F (37.6 C) (Oral)  Resp 22  Ht 5' (1.524 m)  Wt 126 lb (57.153 kg)  BMI 24.61 kg/m2  SpO2 93% Physical Exam  Constitutional: She is oriented to person, place, and time. She appears well-developed and well-nourished. No distress.  HENT:  Head: Normocephalic and atraumatic.  Right Ear: Tympanic membrane and ear canal normal.  Left Ear: Tympanic membrane and ear canal normal.  Mouth/Throat: Uvula is midline, oropharynx is clear and moist and mucous membranes are normal. No oropharyngeal exudate.  Eyes: EOM are normal. Pupils are equal, round, and reactive to light.  Neck: Normal range of motion, full passive range of motion without pain and phonation normal. Neck supple.  Cardiovascular: Normal rate, regular rhythm, normal heart sounds and intact distal pulses.   No murmur heard. Pulmonary/Chest: Effort normal. No stridor. No respiratory distress. She has no wheezes. She has no rales. She exhibits no tenderness.  Lungs are clear to ausculation bilaterally. No rales or wheezing  Abdominal: Soft. She exhibits no distension. There is no tenderness. There is no rebound and no guarding.  Musculoskeletal: She exhibits no edema.  Lymphadenopathy:    She has no cervical adenopathy.  Neurological: She is alert and oriented to person,  place, and time. She exhibits normal muscle tone. Coordination normal.  Skin: Skin is warm and dry.  Nursing note and vitals reviewed.   ED Course  Procedures (including critical care time) Labs Review Labs Reviewed  CBC WITH DIFFERENTIAL/PLATELET - Abnormal; Notable for the following:    WBC 11.8 (*)    Hemoglobin 11.7 (*)    Neutrophils Relative % 88 (*)    Lymphocytes Relative 5 (*)    Neutro Abs 10.4 (*)    Lymphs Abs 0.6 (*)    All other components within normal limits  BASIC METABOLIC PANEL - Abnormal; Notable for the following:    Chloride 98 (*)    Glucose, Bld 124 (*)    All other components within normal limits  TROPONIN I    Imaging Review Ct Angio Chest Pe W/cm &/or Wo Cm  06/24/2015   CLINICAL DATA:  Shortness of breath for 1 day. History of COPD and right foot surgery 2 weeks ago. Evaluate for pulmonary embolism. Initial encounter.  EXAM: CT ANGIOGRAPHY CHEST WITH CONTRAST  TECHNIQUE: Multidetector  CT imaging of the chest was performed using the standard protocol during bolus administration of intravenous contrast. Multiplanar CT image reconstructions and MIPs were obtained to evaluate the vascular anatomy.  CONTRAST:  118mL OMNIPAQUE IOHEXOL 350 MG/ML SOLN  COMPARISON:  Radiographs 06/24/2015.  CT 07/19/2014.  FINDINGS: Mediastinum: The pulmonary arteries are adequately opacified with contrast. There is no evidence of acute pulmonary embolism. There is atherosclerosis of the aorta, great vessels and coronary arteries. There are small lymph nodes within the AP window and right paratracheal stations which have mildly enlarged compared with the prior examination, measuring 8 mm on image 32 and 7 mm on image 35, respectively. These remain within normal limits by size criteria. No pathologically enlarged lymph nodes demonstrated. There is a stable small hiatal hernia.  Lungs/Pleura: There is no pleural effusion.Moderate to severe centrilobular emphysema again noted with biapical  scarring. The previously demonstrated nodular density in the left upper lobe has resolved. However, there are multiple new ill-defined nodular densities throughout the left lung. The largest of these are in the left upper lobe, measuring 11 mm on image 21 and 17 mm on image 25. There are multiple other smaller ill-defined nodular densities throughout the lingula and left lower lobe. Some of these have the a tree-in-bud distribution. There is associated central airway thickening.  Upper abdomen: Stable without acute findings.  Musculoskeletal/Chest wall: No chest wall lesion or acute osseous findings.Old rib fractures noted on the left.  Review of the MIP images confirms the above findings.  IMPRESSION: 1. No evidence of acute pulmonary embolism. 2. Interval development of ill-defined nodularity throughout the left lung. Appearance is most consistent with an inflammatory process, likely infectious. If the patient has no signs or symptoms of acute infection, this could reflect chronic inflammation such as atypical mycobacterial infection. 3. Interval mild enlargement of small lymph nodes in the right paratracheal and AP window stations, nonspecific, although likely reactive. 4. Given the nodular components in the left lung, CT follow-up in 6 months recommended to exclude neoplasm.   Electronically Signed   By: Richardean Sale M.D.   On: 06/24/2015 12:30   Dg Chest Portable 1 View  06/24/2015   CLINICAL DATA:  Shortness of breath, chest tightness, nausea and vomiting.  EXAM: PORTABLE CHEST - 1 VIEW  COMPARISON:  02/17/2015  FINDINGS: COPD again identified. Atelectasis versus early infiltrate at the right lung base. Mild left basilar atelectasis present. No overt edema, pleural fluid or pneumothorax. No nodules are identified. The heart size and mediastinal contours are within normal limits.  IMPRESSION: COPD with atelectasis versus early infiltrate at the right lung base. Mild left basilar atelectasis.    Electronically Signed   By: Aletta Edouard M.D.   On: 06/24/2015 09:48     EKG Interpretation   Date/Time:  Saturday June 24 2015 09:12:24 EDT Ventricular Rate:  118 PR Interval:  155 QRS Duration: 76 QT Interval:  316 QTC Calculation: 443 R Axis:   94 Text Interpretation:  Sinus tachycardia Consider right ventricular  hypertrophy Borderline T abnormalities, anterior leads Sinus tachycardia  Otherwise no significant change Confirmed by ZACKOWSKI  MD, SCOTT ([id number])  on 06/24/2015 9:17:09 AM      MDM   Final diagnoses:  Community acquired pneumonia  Vomiting and diarrhea    Pt feeling better.  Has received IVF's previous tachycardia improved.  CT angio is neg for PE.  Likely CAP.  Have treated with ICV rocephin , care plan discussed with Dr. Rogene Houston and pt also  seen by him .  She appears stable for d/c and agrees to close f/u next week with her pulmo.  She has drank fluids and eaten during ed stay and also agrees to return for any worsening sx's.    Kem Parkinson, PA-C 06/25/15 2124

## 2015-08-09 ENCOUNTER — Other Ambulatory Visit: Payer: Self-pay | Admitting: Family Medicine

## 2015-08-09 ENCOUNTER — Ambulatory Visit
Admission: RE | Admit: 2015-08-09 | Discharge: 2015-08-09 | Disposition: A | Discharge status: Home / Self Care | Payer: Medicare HMO | Source: Ambulatory Visit | Attending: Family Medicine | Admitting: Family Medicine

## 2015-08-09 DIAGNOSIS — J189 Pneumonia, unspecified organism: Secondary | ICD-10-CM

## 2015-11-05 ENCOUNTER — Emergency Department (HOSPITAL_COMMUNITY): Payer: Medicare HMO

## 2015-11-05 ENCOUNTER — Inpatient Hospital Stay (HOSPITAL_COMMUNITY)
Admission: EM | Admit: 2015-11-05 | Discharge: 2015-11-07 | DRG: 190 | Disposition: A | Discharge status: Home / Self Care | Payer: Medicare HMO | Attending: Internal Medicine | Admitting: Internal Medicine

## 2015-11-05 ENCOUNTER — Encounter (HOSPITAL_COMMUNITY): Payer: Self-pay | Admitting: Emergency Medicine

## 2015-11-05 DIAGNOSIS — I10 Essential (primary) hypertension: Secondary | ICD-10-CM | POA: Diagnosis present

## 2015-11-05 DIAGNOSIS — E785 Hyperlipidemia, unspecified: Secondary | ICD-10-CM | POA: Diagnosis present

## 2015-11-05 DIAGNOSIS — E7800 Pure hypercholesterolemia, unspecified: Secondary | ICD-10-CM | POA: Diagnosis present

## 2015-11-05 DIAGNOSIS — R0602 Shortness of breath: Secondary | ICD-10-CM | POA: Diagnosis not present

## 2015-11-05 DIAGNOSIS — J449 Chronic obstructive pulmonary disease, unspecified: Secondary | ICD-10-CM | POA: Diagnosis present

## 2015-11-05 DIAGNOSIS — J441 Chronic obstructive pulmonary disease with (acute) exacerbation: Secondary | ICD-10-CM

## 2015-11-05 DIAGNOSIS — J9621 Acute and chronic respiratory failure with hypoxia: Secondary | ICD-10-CM | POA: Diagnosis present

## 2015-11-05 DIAGNOSIS — Z9981 Dependence on supplemental oxygen: Secondary | ICD-10-CM

## 2015-11-05 DIAGNOSIS — Z87891 Personal history of nicotine dependence: Secondary | ICD-10-CM

## 2015-11-05 DIAGNOSIS — E782 Mixed hyperlipidemia: Secondary | ICD-10-CM | POA: Diagnosis present

## 2015-11-05 DIAGNOSIS — J9610 Chronic respiratory failure, unspecified whether with hypoxia or hypercapnia: Secondary | ICD-10-CM | POA: Diagnosis present

## 2015-11-05 DIAGNOSIS — D509 Iron deficiency anemia, unspecified: Secondary | ICD-10-CM | POA: Diagnosis present

## 2015-11-05 DIAGNOSIS — Z79899 Other long term (current) drug therapy: Secondary | ICD-10-CM

## 2015-11-05 DIAGNOSIS — J9611 Chronic respiratory failure with hypoxia: Secondary | ICD-10-CM | POA: Diagnosis present

## 2015-11-05 LAB — BASIC METABOLIC PANEL
Anion gap: 11 (ref 5–15)
BUN: 6 mg/dL (ref 6–20)
CHLORIDE: 99 mmol/L — AB (ref 101–111)
CO2: 28 mmol/L (ref 22–32)
CREATININE: 0.55 mg/dL (ref 0.44–1.00)
Calcium: 9.1 mg/dL (ref 8.9–10.3)
GFR calc Af Amer: >60 mL/min (ref >60)
GLUCOSE: 150 mg/dL — AB (ref 65–99)
POTASSIUM: 3.6 mmol/L (ref 3.5–5.1)
SODIUM: 138 mmol/L (ref 135–145)

## 2015-11-05 LAB — CBC
HCT: 36.1 % (ref 36.0–46.0)
Hemoglobin: 12 g/dL (ref 12.0–15.0)
MCH: 28.8 pg (ref 26.0–34.0)
MCHC: 33.2 g/dL (ref 30.0–36.0)
MCV: 86.8 fL (ref 78.0–100.0)
PLATELETS: 323 10*3/uL (ref 150–400)
RBC: 4.16 MIL/uL (ref 3.87–5.11)
RDW: 13.1 % (ref 11.5–15.5)
WBC: 16.7 10*3/uL — AB (ref 4.0–10.5)

## 2015-11-05 MED ORDER — PREDNISONE 10 MG PO TABS: ORAL_TABLET | ORAL | Status: DC

## 2015-11-05 MED ORDER — ALBUTEROL SULFATE (2.5 MG/3ML) 0.083% IN NEBU: 5.0000 mg | INHALATION_SOLUTION | Freq: Once | RESPIRATORY_TRACT | Status: DC

## 2015-11-05 MED ORDER — ALBUTEROL (5 MG/ML) CONTINUOUS INHALATION SOLN
10.0000 mg/h | INHALATION_SOLUTION | Freq: Once | RESPIRATORY_TRACT | Status: AC
  Administered 2015-11-05: 10 mg/h via RESPIRATORY_TRACT
  Filled 2015-11-05: qty 20

## 2015-11-05 MED ORDER — ALBUTEROL SULFATE (2.5 MG/3ML) 0.083% IN NEBU
2.5000 mg | INHALATION_SOLUTION | Freq: Once | RESPIRATORY_TRACT | Status: AC
  Administered 2015-11-05: 2.5 mg via RESPIRATORY_TRACT
  Filled 2015-11-05: qty 3

## 2015-11-05 MED ORDER — PREDNISONE 10 MG PO TABS
60.0000 mg | ORAL_TABLET | Freq: Once | ORAL | Status: AC
Start: 2015-11-05 — End: 2015-11-05
  Administered 2015-11-05: 60 mg via ORAL
  Filled 2015-11-05: qty 1

## 2015-11-05 MED ORDER — IPRATROPIUM BROMIDE 0.02 % IN SOLN: 0.5000 mg | Freq: Once | RESPIRATORY_TRACT | Status: DC

## 2015-11-05 MED ORDER — IPRATROPIUM-ALBUTEROL 0.5-2.5 (3) MG/3ML IN SOLN
3.0000 mL | Freq: Once | RESPIRATORY_TRACT | Status: AC
  Administered 2015-11-05: 3 mL via RESPIRATORY_TRACT
  Filled 2015-11-05: qty 3

## 2015-11-05 NOTE — ED Notes (Signed)
Patient ambulated on 2L 89 percent 02 sat. heart rate 124.

## 2015-11-05 NOTE — ED Provider Notes (Signed)
CSN: [id number]     Arrival date & time 11/05/15  1854 History   First MD Initiated Contact with Patient 11/05/15 2025     Chief Complaint  Patient presents with  . Shortness of Breath     Patient is a 59 y.o. female presenting with shortness of breath. The history is provided by the patient.  Shortness of Breath Severity:  Moderate Onset quality:  Gradual Duration:  3 weeks Timing:  Intermittent Progression:  Worsening Chronicity:  Recurrent Relieved by:  Nothing Worsened by:  Exertion Associated symptoms: cough, sputum production and wheezing   Associated symptoms: no abdominal pain, no chest pain, no fever, no hemoptysis and no vomiting   Patient has h/o COPD, on home oxygen 2L daily, here for worsening SOB She has had cough/sob for 3 weeks Seen by PCP twice, she had a burst of prednisone for 5 days with doxycycline, then started on levaquin recently She reports she is not improving She reports she is dyspneic on exertion. She reports tonight she had more wheezing/cough and home oxygen count "Was low" as she has oximeter at home  Past Medical History  Diagnosis Date  . Hypertension   . High cholesterol   . COPD (chronic obstructive pulmonary disease) [facility])    Past Surgical History  Procedure Laterality Date  . Hallux valgus base wedge Right 06/09/2015    Procedure: Base wedge osteotomy with modified McBride right foot ;  Surgeon: Sharlotte Alamo, MD;  Location: [facility] ORS;  Service: Podiatry;  Laterality: Right;   History reviewed. No pertinent family history. Social History  Substance Use Topics  . Smoking status: Former Smoker    Quit date: 11/04/2013  . Smokeless tobacco: None  . Alcohol Use: No   OB History    No data available     Review of Systems  Constitutional: Negative for fever.  Respiratory: Positive for cough, sputum production, shortness of breath and wheezing. Negative for hemoptysis.   Cardiovascular: Negative for chest pain.  Gastrointestinal:  Negative for vomiting, abdominal pain and blood in stool.  All other systems reviewed and are negative.     Allergies  Review of patient's allergies indicates no known allergies.  Home Medications   Prior to Admission medications   Medication Sig Start Date End Date Taking? Authorizing Provider  albuterol (PROVENTIL HFA;VENTOLIN HFA) 108 (90 BASE) MCG/ACT inhaler Inhale 4-6 puffs into the lungs every 6 (six) hours as needed for wheezing or shortness of breath.    Historical Provider, MD  amLODipine (NORVASC) 5 MG tablet Take 5 mg by mouth at bedtime.    Historical Provider, MD  azithromycin (ZITHROMAX) 250 MG tablet Take first 2 tablets together, then 1 every day until finished. 06/24/15   Tammy Triplett, PA-C  ferrous sulfate 325 (65 FE) MG tablet Take 325 mg by mouth 2 (two) times daily with a meal.     Historical Provider, MD  gabapentin (NEURONTIN) 300 MG capsule Take 300-600 mg by mouth 3 (three) times daily. 1cap in the am and 1 cap in the afternoon and 2cap at bedtime    Historical Provider, MD  ipratropium (ATROVENT HFA) 17 MCG/ACT inhaler Inhale 2 puffs into the lungs every 4 (four) hours as needed for wheezing.    Historical Provider, MD  levalbuterol Penne Lash) 1.25 MG/3ML nebulizer solution Take 1.25 mg by nebulization every 6 (six) hours as needed for wheezing or shortness of breath.     Historical Provider, MD  mometasone-formoterol (DULERA) 100-5 MCG/ACT AERO Inhale 2 puffs  into the lungs 2 (two) times daily.    Historical Provider, MD  mometasone-formoterol (DULERA) 200-5 MCG/ACT AERO Inhale 2 puffs into the lungs 2 (two) times daily.    Historical Provider, MD  ondansetron (ZOFRAN) 8 MG tablet Take 1 tablet (8 mg total) by mouth every 8 (eight) hours as needed for nausea or vomiting. 06/24/15   Tammy Triplett, PA-C  oxyCODONE-acetaminophen (ROXICET) 5-325 MG per tablet Take 1-2 tablets by mouth every 4 (four) hours as needed for severe pain. Patient not taking: Reported on  06/24/2015 06/09/15   Sharlotte Alamo, MD  OXYGEN Inhale 2 L into the lungs continuous.    Historical Provider, MD  predniSONE (DELTASONE) 50 MG tablet Take one tablet daily Patient not taking: Reported on 06/09/2015 02/17/15   Ashley Murrain, NP  ranitidine (ZANTAC) 150 MG tablet Take 150 mg by mouth 2 (two) times daily.    Historical Provider, MD  simvastatin (ZOCOR) 40 MG tablet Take 40 mg by mouth at bedtime.    Historical Provider, MD  tiotropium (SPIRIVA) 18 MCG inhalation capsule Place 18 mcg into inhaler and inhale daily.    Historical Provider, MD  Vitamin D, Ergocalciferol, (DRISDOL) 50000 UNITS CAPS capsule Take 50,000 Units by mouth every 30 (thirty) days.    Historical Provider, MD   BP 131/73 mmHg  Pulse 116  Temp(Src) 98.1 F (36.7 C) (Oral)  Resp 20  Ht 5' (1.524 m)  Wt 55.339 kg  BMI 23.83 kg/m2  SpO2 96% Physical Exam CONSTITUTIONAL: Well developed/well nourished HEAD: Normocephalic/atraumatic EYES: EOMI/PERRL ENMT: Mucous membranes moist NECK: supple no meningeal signs SPINE/BACK:entire spine nontender CV: S1/S2 noted, no murmurs/rubs/gallops noted LUNGS: coarse breath sounds noted bilaterally with superimposed wheezing ABDOMEN: soft, nontender, no rebound or guarding, bowel sounds noted throughout abdomen GU:no cva tenderness NEURO: Pt is awake/alert/appropriate, moves all extremitiesx4.  No facial droop.   EXTREMITIES: pulses normal/equal, full ROM, no LE edema noted SKIN: warm, color normal PSYCH: no abnormalities of mood noted, alert and oriented to situation  ED Course  Procedures  Medications  albuterol (PROVENTIL,VENTOLIN) solution continuous neb (not administered)  predniSONE (DELTASONE) tablet 60 mg (60 mg Oral Given 11/05/15 2046)  ipratropium-albuterol (DUONEB) 0.5-2.5 (3) MG/3ML nebulizer solution 3 mL (3 mLs Nebulization Given 11/05/15 2101)  albuterol (PROVENTIL) (2.5 MG/3ML) 0.083% nebulizer solution 2.5 mg (2.5 mg Nebulization Given 11/05/15 2101)     Imaging Review Dg Chest 2 View  11/05/2015  CLINICAL DATA:  Short of breath. Productive cough for 2-3 weeks, worse today. History of COPD and hypertension. EXAM: CHEST  2 VIEW COMPARISON:  08/09/2015 FINDINGS: Cardiac silhouette normal in size and configuration. Normal mediastinal and hilar contours. Lungs are hyperexpanded. Mild scarring noted in the upper lobes at the apices. No lung consolidation or edema. No pleural effusion or pneumothorax. Bony thorax is demineralized but grossly intact. IMPRESSION: No acute cardiopulmonary disease. Hyperexpanded lungs consistent with COPD. Electronically Signed   By: Lajean Manes M.D.   On: 11/05/2015 19:40   I have personally reviewed and evaluated these images and lab results as part of my medical decision-making.  ED ECG REPORT EPIC link not working  Date: 11/05/2015 2049  Rate: 98  Rhythm: normal sinus rhythm  QRS Axis: normal  Intervals: normal  ST/T Wave abnormalities: normal  Conduction Disutrbances:none  I have personally reviewed the EKG tracing and agree with the computerized printout as noted.  9:00 PM Pt with probable COPD exacerbation She is not on steroids at this time Plan to treat here  and likely d/c home Will give nebs and reassess Will need prolonged steroids and pulmonology f/u 10:06 PM After initial nebs pt still tachypneic with ambulating Will need hour long neb After this, if still dyspneic on exertion Will need admit D/w dr Thurnell Garbe to reassess patient.  Labs also pending at this time  MDM   Final diagnoses:  Chronic obstructive pulmonary disease with acute exacerbation [facility])    Nursing notes including past medical history and social history reviewed and considered in documentation xrays/imaging reviewed by myself and considered during evaluation     Ripley Fraise, MD 11/05/15 2208

## 2015-11-05 NOTE — ED Notes (Signed)
Patient ambulated around nursing station. Patient's heart rate elevated to 140. Patient walked with 2L of 02 sat 95. Patient back in room states that she feels short of breath and tired.

## 2015-11-05 NOTE — ED Notes (Signed)
Pt here for increased SOB that she says she has had for 3 weeks. States she has been on an antibiotic for "lung infection" but that it is not doing "any good".

## 2015-11-06 ENCOUNTER — Encounter (HOSPITAL_COMMUNITY): Payer: Self-pay | Admitting: *Deleted

## 2015-11-06 DIAGNOSIS — J441 Chronic obstructive pulmonary disease with (acute) exacerbation: Secondary | ICD-10-CM | POA: Diagnosis present

## 2015-11-06 DIAGNOSIS — I10 Essential (primary) hypertension: Secondary | ICD-10-CM | POA: Diagnosis present

## 2015-11-06 DIAGNOSIS — J9621 Acute and chronic respiratory failure with hypoxia: Secondary | ICD-10-CM | POA: Diagnosis present

## 2015-11-06 DIAGNOSIS — R0602 Shortness of breath: Secondary | ICD-10-CM | POA: Diagnosis present

## 2015-11-06 DIAGNOSIS — J449 Chronic obstructive pulmonary disease, unspecified: Secondary | ICD-10-CM | POA: Diagnosis present

## 2015-11-06 DIAGNOSIS — D509 Iron deficiency anemia, unspecified: Secondary | ICD-10-CM | POA: Diagnosis present

## 2015-11-06 DIAGNOSIS — Z9981 Dependence on supplemental oxygen: Secondary | ICD-10-CM | POA: Diagnosis not present

## 2015-11-06 DIAGNOSIS — Z87891 Personal history of nicotine dependence: Secondary | ICD-10-CM | POA: Diagnosis not present

## 2015-11-06 DIAGNOSIS — Z79899 Other long term (current) drug therapy: Secondary | ICD-10-CM | POA: Diagnosis not present

## 2015-11-06 DIAGNOSIS — E7800 Pure hypercholesterolemia, unspecified: Secondary | ICD-10-CM | POA: Diagnosis present

## 2015-11-06 LAB — COMPREHENSIVE METABOLIC PANEL
ALK PHOS: 80 U/L (ref 38–126)
ALT: 11 U/L — AB (ref 14–54)
AST: 29 U/L (ref 15–41)
Albumin: 3.7 g/dL (ref 3.5–5.0)
Anion gap: 11 (ref 5–15)
BILIRUBIN TOTAL: 0.4 mg/dL (ref 0.3–1.2)
BUN: 7 mg/dL (ref 6–20)
CALCIUM: 9.1 mg/dL (ref 8.9–10.3)
CO2: 28 mmol/L (ref 22–32)
CREATININE: 0.65 mg/dL (ref 0.44–1.00)
Chloride: 99 mmol/L — ABNORMAL LOW (ref 101–111)
GFR calc Af Amer: >60 mL/min (ref >60)
Glucose, Bld: 162 mg/dL — ABNORMAL HIGH (ref 65–99)
POTASSIUM: 3.8 mmol/L (ref 3.5–5.1)
Sodium: 138 mmol/L (ref 135–145)
TOTAL PROTEIN: 7.2 g/dL (ref 6.5–8.1)

## 2015-11-06 LAB — CBC
HEMATOCRIT: 35.3 % — AB (ref 36.0–46.0)
HEMOGLOBIN: 11.9 g/dL — AB (ref 12.0–15.0)
MCH: 29 pg (ref 26.0–34.0)
MCHC: 33.7 g/dL (ref 30.0–36.0)
MCV: 85.9 fL (ref 78.0–100.0)
Platelets: 318 10*3/uL (ref 150–400)
RBC: 4.11 MIL/uL (ref 3.87–5.11)
RDW: 13 % (ref 11.5–15.5)
WBC: 12.2 10*3/uL — AB (ref 4.0–10.5)

## 2015-11-06 MED ORDER — LEVALBUTEROL HCL 0.63 MG/3ML IN NEBU
0.6300 mg | INHALATION_SOLUTION | Freq: Four times a day (QID) | RESPIRATORY_TRACT | Status: DC
Start: 2015-11-06 — End: 2015-11-06

## 2015-11-06 MED ORDER — ENOXAPARIN SODIUM 40 MG/0.4ML ~~LOC~~ SOLN
40.0000 mg | SUBCUTANEOUS | Status: DC
  Administered 2015-11-06: 40 mg via SUBCUTANEOUS
  Filled 2015-11-06 (×2): qty 0.4

## 2015-11-06 MED ORDER — IPRATROPIUM BROMIDE 0.02 % IN SOLN
RESPIRATORY_TRACT | Status: AC
  Administered 2015-11-06: 0.5 mg
  Filled 2015-11-06: qty 2.5

## 2015-11-06 MED ORDER — ONDANSETRON HCL 4 MG PO TABS
4.0000 mg | ORAL_TABLET | Freq: Four times a day (QID) | ORAL | Status: DC | PRN
Start: 2015-11-06 — End: 2015-11-07

## 2015-11-06 MED ORDER — IPRATROPIUM-ALBUTEROL 0.5-2.5 (3) MG/3ML IN SOLN
3.0000 mL | Freq: Four times a day (QID) | RESPIRATORY_TRACT | Status: DC
  Administered 2015-11-06: 3 mL via RESPIRATORY_TRACT
  Filled 2015-11-06: qty 3

## 2015-11-06 MED ORDER — LEVOFLOXACIN 500 MG PO TABS
500.0000 mg | ORAL_TABLET | Freq: Every day | ORAL | Status: DC
  Administered 2015-11-06 – 2015-11-07 (×2): 500 mg via ORAL
  Filled 2015-11-06 (×2): qty 1

## 2015-11-06 MED ORDER — SODIUM CHLORIDE 0.9 % IV SOLN: INTRAVENOUS | Status: DC

## 2015-11-06 MED ORDER — GUAIFENESIN ER 600 MG PO TB12
600.0000 mg | ORAL_TABLET | Freq: Two times a day (BID) | ORAL | Status: DC
  Administered 2015-11-06 – 2015-11-07 (×4): 600 mg via ORAL
  Filled 2015-11-06 (×4): qty 1

## 2015-11-06 MED ORDER — IPRATROPIUM BROMIDE 0.02 % IN SOLN
0.5000 mg | Freq: Four times a day (QID) | RESPIRATORY_TRACT | Status: DC
  Administered 2015-11-06 (×2): 0.5 mg via RESPIRATORY_TRACT
  Filled 2015-11-06 (×2): qty 2.5

## 2015-11-06 MED ORDER — VITAMIN D (ERGOCALCIFEROL) 1.25 MG (50000 UNIT) PO CAPS: 50000.0000 [IU] | ORAL_CAPSULE | ORAL | Status: DC

## 2015-11-06 MED ORDER — AMLODIPINE BESYLATE 5 MG PO TABS
5.0000 mg | ORAL_TABLET | Freq: Every day | ORAL | Status: DC
  Administered 2015-11-06: 5 mg via ORAL
  Filled 2015-11-06: qty 1

## 2015-11-06 MED ORDER — ALBUTEROL SULFATE (2.5 MG/3ML) 0.083% IN NEBU: 2.5000 mg | INHALATION_SOLUTION | RESPIRATORY_TRACT | Status: DC

## 2015-11-06 MED ORDER — LEVALBUTEROL HCL 0.63 MG/3ML IN NEBU
0.6300 mg | INHALATION_SOLUTION | Freq: Four times a day (QID) | RESPIRATORY_TRACT | Status: DC | PRN
  Administered 2015-11-06: 0.63 mg via RESPIRATORY_TRACT
  Filled 2015-11-06: qty 3

## 2015-11-06 MED ORDER — ATORVASTATIN CALCIUM 20 MG PO TABS
20.0000 mg | ORAL_TABLET | Freq: Every day | ORAL | Status: DC
  Administered 2015-11-06: 20 mg via ORAL
  Filled 2015-11-06: qty 1

## 2015-11-06 MED ORDER — OXYCODONE-ACETAMINOPHEN 5-325 MG PO TABS: 1.0000 | ORAL_TABLET | ORAL | Status: DC | PRN

## 2015-11-06 MED ORDER — CETYLPYRIDINIUM CHLORIDE 0.05 % MT LIQD
7.0000 mL | Freq: Two times a day (BID) | OROMUCOSAL | Status: DC
  Administered 2015-11-06 – 2015-11-07 (×4): 7 mL via OROMUCOSAL

## 2015-11-06 MED ORDER — SIMVASTATIN 20 MG PO TABS: 40.0000 mg | ORAL_TABLET | Freq: Every day | ORAL | Status: DC

## 2015-11-06 MED ORDER — LEVALBUTEROL HCL 0.63 MG/3ML IN NEBU
0.6300 mg | INHALATION_SOLUTION | Freq: Four times a day (QID) | RESPIRATORY_TRACT | Status: DC
  Administered 2015-11-06: 0.63 mg via RESPIRATORY_TRACT

## 2015-11-06 MED ORDER — ONDANSETRON HCL 4 MG/2ML IJ SOLN: 4.0000 mg | Freq: Four times a day (QID) | INTRAMUSCULAR | Status: DC | PRN

## 2015-11-06 MED ORDER — METHYLPREDNISOLONE SODIUM SUCC 125 MG IJ SOLR
60.0000 mg | Freq: Four times a day (QID) | INTRAMUSCULAR | Status: DC
  Administered 2015-11-06 – 2015-11-07 (×5): 60 mg via INTRAVENOUS
  Filled 2015-11-06 (×5): qty 2

## 2015-11-06 MED ORDER — IPRATROPIUM-ALBUTEROL 0.5-2.5 (3) MG/3ML IN SOLN
3.0000 mL | Freq: Three times a day (TID) | RESPIRATORY_TRACT | Status: DC
  Administered 2015-11-07: 3 mL via RESPIRATORY_TRACT
  Filled 2015-11-06: qty 3

## 2015-11-06 MED ORDER — LEVALBUTEROL HCL 0.63 MG/3ML IN NEBU
1.2500 mg | INHALATION_SOLUTION | Freq: Four times a day (QID) | RESPIRATORY_TRACT | Status: DC
  Administered 2015-11-06: 1.25 mg via RESPIRATORY_TRACT
  Filled 2015-11-06: qty 6

## 2015-11-06 MED ORDER — NORTRIPTYLINE HCL 10 MG PO CAPS
10.0000 mg | ORAL_CAPSULE | Freq: Every day | ORAL | Status: DC
  Administered 2015-11-06: 10 mg via ORAL
  Filled 2015-11-06 (×2): qty 1

## 2015-11-06 NOTE — Progress Notes (Signed)
[facility] PROGRESS NOTE  YONNA DIPPOLD [id number] DOB: 1956-01-11 DOA: 11/05/2015 PCP: Joanie Coddington, MD  Brief narrative 59 year old female with history of COPD on home O2 (quit smoking 2 years back), hypertension, hyperlipidemia presenting with COPD exacerbation. Patient reports shortness of breath for almost 3 weeks and was seen by PCP prescribed her a course of prednisone and doxycycline which she completed but did not relieve her symptoms. She then went to her PCP on the day prior to admission and was started on Levaquin but since he had persistent shortness of breath with productive green sputum she came to the ED. In the ED she was given oral prednisone and albuterol nebulizer. Her O2 sat dropped to 89% on 2 L on exertion. Admitted for further management. Patient denies any sick contacts, recent travel.   Assessment/Plan: Acute COPD exacerbation. Symptoms better this morning. Maintaining O2 sat on 2 L via nasal cannula, On IV Solu-Medrol, scheduled DuoNeb, antitussives and empiric Levaquin. Has quit smoking for the past 2 years but her boyfriend smokes. instructed that he should be to leave him to quit smoking or not smoking inside the house. Patient reports she has not had her flu shot and refuses taking it stating she got sick when she had the vaccine few years ago and since stopped taking the  vaccine.  Essential Hypertension Continue home blood pressure medication  Hyperlipidemia Continue statin.  Diet: Low sodium DVT prophylaxis: Subcutaneous Lovenox  Code Status: Full code Family Communication: None at bedside Disposition Plan: Home possibly tomorrow if symptoms improving   Consultants:  None  Procedures:  None  Antibiotics:  Levaquin  HPI/Subjective: Seen and examined.  reports her breathing to be better and having nonproductive cough.  Objective: Filed Vitals:   11/06/15 0631 11/06/15 0736  BP: 111/57   Pulse: 89 93  Temp: 98.2 F (36.8 C)    Resp: 18 19    Intake/Output Summary (Last 24 hours) at 11/06/15 1441 Last data filed at 11/06/15 0829  Gross per 24 hour  Intake    240 ml  Output      0 ml  Net    240 ml   Filed Weights   11/05/15 1907 11/06/15 0000  Weight: 55.339 kg (122 lb) 54.114 kg (119 lb 4.8 oz)    Exam:   General:  We did aged female not in distress  HEENT: Moist oral mucosa  Chest: Coarse breath sounds bilaterally no rhonchi or wheeze  Cardiovascular: Normal S1 and S2, no murmurs  GI: Soft, nondistended, nontender  Musculoskeletal: Warm, no edema  Data Reviewed: Basic Metabolic Panel:  Recent Labs Lab 11/05/15 2248 11/06/15 0618  NA 138 138  K 3.6 3.8  CL 99* 99*  CO2 28 28  GLUCOSE 150* 162*  BUN 6 7  CREATININE 0.55 0.65  CALCIUM 9.1 9.1   Liver Function Tests:  Recent Labs Lab 11/06/15 0618  AST 29  ALT 11*  ALKPHOS 80  BILITOT 0.4  PROT 7.2  ALBUMIN 3.7   No results for input(s): LIPASE, AMYLASE in the last 168 hours. No results for input(s): AMMONIA in the last 168 hours. CBC:  Recent Labs Lab 11/05/15 2248 11/06/15 0618  WBC 16.7* 12.2*  HGB 12.0 11.9*  HCT 36.1 35.3*  MCV 86.8 85.9  PLT 323 318   Cardiac Enzymes: No results for input(s): CKTOTAL, CKMB, CKMBINDEX, TROPONINI in the last 168 hours. BNP (last 3 results)  Recent Labs  11/07/14 0018  BNP 76    ProBNP (last  3 results) No results for input(s): PROBNP in the last 8760 hours.  CBG: No results for input(s): GLUCAP in the last 168 hours.  No results found for this or any previous visit (from the past 240 hour(s)).   Studies: Dg Chest 2 View  11/05/2015  CLINICAL DATA:  Short of breath. Productive cough for 2-3 weeks, worse today. History of COPD and hypertension. EXAM: CHEST  2 VIEW COMPARISON:  08/09/2015 FINDINGS: Cardiac silhouette normal in size and configuration. Normal mediastinal and hilar contours. Lungs are hyperexpanded. Mild scarring noted in the upper lobes at the  apices. No lung consolidation or edema. No pleural effusion or pneumothorax. Bony thorax is demineralized but grossly intact. IMPRESSION: No acute cardiopulmonary disease. Hyperexpanded lungs consistent with COPD. Electronically Signed   By: Lajean Manes M.D.   On: 11/05/2015 19:40    Scheduled Meds: . amLODipine  5 mg Oral QHS  . antiseptic oral rinse  7 mL Mouth Rinse BID  . atorvastatin  20 mg Oral q1800  . enoxaparin (LOVENOX) injection  40 mg Subcutaneous Q24H  . guaiFENesin  600 mg Oral BID  . ipratropium  0.5 mg Nebulization Q6H  . levalbuterol  0.63 mg Nebulization Q6H  . levofloxacin  500 mg Oral Daily  . methylPREDNISolone (SOLU-MEDROL) injection  60 mg Intravenous Q6H  . nortriptyline  10 mg Oral QHS  . [START ON 11/19/2015] Vitamin D (Ergocalciferol)  50,000 Units Oral Q30 days   Continuous Infusions: . sodium chloride 10 mL/hr at 11/06/15 0215     Time spent: 20 minutes    Eulogia Dismore, [place]  [facility] Pager [phone]. If 7PM-7AM, please contact night-coverage at [email or web address], password [email or web address] 11/06/2015, 2:41 PM

## 2015-11-06 NOTE — ED Provider Notes (Signed)
Pt received at sign out with continuous neb in progress, steroid already given. Pt ambulated after neb treatment: O2 Sats dropped to 89% on her usual home O2 N/C, HR and RR increased, and pt c/o increasing SOB. Lungs diminished bilat. Will admit.  T/C to [facility] Dr. Darrick Meigs, case discussed, including:  HPI, pertinent PM/SHx, VS/PE, dx testing, ED course and treatment:  Agreeable to admit, requests to write temporary orders, obtain observation tele bed to team APAdmits.    Results for orders placed or performed during the hospital encounter of 11/05/15  CBC  Result Value Ref Range   WBC 16.7 (H) 4.0 - 10.5 K/uL   RBC 4.16 3.87 - 5.11 MIL/uL   Hemoglobin 12.0 12.0 - 15.0 g/dL   HCT 36.1 36.0 - 46.0 %   MCV 86.8 78.0 - 100.0 fL   MCH 28.8 26.0 - 34.0 pg   MCHC 33.2 30.0 - 36.0 g/dL   RDW 13.1 11.5 - 15.5 %   Platelets 323 150 - 400 K/uL  Basic metabolic panel  Result Value Ref Range   Sodium 138 135 - 145 mmol/L   Potassium 3.6 3.5 - 5.1 mmol/L   Chloride 99 (L) 101 - 111 mmol/L   CO2 28 22 - 32 mmol/L   Glucose, Bld 150 (H) 65 - 99 mg/dL   BUN 6 6 - 20 mg/dL   Creatinine, Ser 0.55 0.44 - 1.00 mg/dL   Calcium 9.1 8.9 - 10.3 mg/dL   GFR calc non Af Amer >60 >60 mL/min   GFR calc Af Amer >60 >60 mL/min   Anion gap 11 5 - 15   Dg Chest 2 View 11/05/2015  CLINICAL DATA:  Short of breath. Productive cough for 2-3 weeks, worse today. History of COPD and hypertension. EXAM: CHEST  2 VIEW COMPARISON:  08/09/2015 FINDINGS: Cardiac silhouette normal in size and configuration. Normal mediastinal and hilar contours. Lungs are hyperexpanded. Mild scarring noted in the upper lobes at the apices. No lung consolidation or edema. No pleural effusion or pneumothorax. Bony thorax is demineralized but grossly intact. IMPRESSION: No acute cardiopulmonary disease. Hyperexpanded lungs consistent with COPD. Electronically Signed   By: Lajean Manes M.D.   On: 11/05/2015 19:40       Francine Graven, DO 11/06/15  [id number]

## 2015-11-06 NOTE — H&P (Signed)
PCP:   Joanie Coddington, MD   Chief Complaint:  Shortness of breath  HPI: 59 year old female who   has a past medical history of Hypertension; High cholesterol; and COPD (chronic obstructive pulmonary disease) ([place]). Today came to the hospital with worsening shortness of breath for past 2 weeks. Patient was seen by her PCP 2 weeks ago and was prescribed prednisone and doxycycline which patient said did not improve her symptoms. Patient again was seen by her PCP yesterday and was started on Levaquin, today patient came to the hospital as she was getting short of breath on exertion. She also has been coughing up green-colored phlegm.  Today in the ED she was given albuterol nebulizer, prednisone. O2 sats dropped to 89% on 2 L oxygen on exertion. She denies chest pain, no nausea vomiting or diarrhea.  Allergies:  No Known Allergies    Past Medical History  Diagnosis Date  . Hypertension   . High cholesterol   . COPD (chronic obstructive pulmonary disease) [facility])     Past Surgical History  Procedure Laterality Date  . Hallux valgus base wedge Right 06/09/2015    Procedure: Base wedge osteotomy with modified McBride right foot ;  Surgeon: Sharlotte Alamo, MD;  Location: [facility] ORS;  Service: Podiatry;  Laterality: Right;    Prior to Admission medications   Medication Sig Start Date End Date Taking? Authorizing Provider  albuterol (PROVENTIL HFA;VENTOLIN HFA) 108 (90 BASE) MCG/ACT inhaler Inhale 4-6 puffs into the lungs every 6 (six) hours as needed for wheezing or shortness of breath.   Yes Historical Provider, MD  amLODipine (NORVASC) 5 MG tablet Take 5 mg by mouth at bedtime.   Yes Historical Provider, MD  ferrous sulfate 325 (65 FE) MG tablet Take 325 mg by mouth 2 (two) times daily with a meal.    Yes Historical Provider, MD  gabapentin (NEURONTIN) 300 MG capsule Take 300-600 mg by mouth 3 (three) times daily. 1cap in the am and 1 cap in the afternoon and 2cap at bedtime   Yes Historical  Provider, MD  ipratropium (ATROVENT HFA) 17 MCG/ACT inhaler Inhale 2 puffs into the lungs every 4 (four) hours as needed for wheezing.   Yes Historical Provider, MD  levalbuterol (XOPENEX) 1.25 MG/3ML nebulizer solution Take 1.25 mg by nebulization every 6 (six) hours as needed for wheezing or shortness of breath.    Yes Historical Provider, MD  levofloxacin (LEVAQUIN) 500 MG tablet Take 500 mg by mouth daily.   Yes Historical Provider, MD  mometasone-formoterol (DULERA) 100-5 MCG/ACT AERO Inhale 2 puffs into the lungs 2 (two) times daily.   Yes Historical Provider, MD  nortriptyline (PAMELOR) 10 MG capsule Take 10 mg by mouth at bedtime. 05/30/15  Yes Historical Provider, MD  ranitidine (ZANTAC) 150 MG tablet Take 150 mg by mouth 2 (two) times daily.   Yes Historical Provider, MD  simvastatin (ZOCOR) 40 MG tablet Take 40 mg by mouth at bedtime.   Yes Historical Provider, MD  tiotropium (SPIRIVA) 18 MCG inhalation capsule Place 18 mcg into inhaler and inhale daily.   Yes Historical Provider, MD  azithromycin (ZITHROMAX) 250 MG tablet Take first 2 tablets together, then 1 every day until finished. 06/24/15   Tammy Triplett, PA-C  mometasone-formoterol (DULERA) 200-5 MCG/ACT AERO Inhale 2 puffs into the lungs 2 (two) times daily.    Historical Provider, MD  ondansetron (ZOFRAN) 8 MG tablet Take 1 tablet (8 mg total) by mouth every 8 (eight) hours as needed for nausea or  vomiting. 06/24/15   Tammy Triplett, PA-C  OXYGEN Inhale 2 L into the lungs continuous.    Historical Provider, MD  predniSONE (DELTASONE) 10 MG tablet Take 4 tablets PO for 4 days, then take 3 tablets PO for 3 days, then take 2 tablets PO for 2 days, then take one tablet PO one day then stop 11/05/15   Ripley Fraise, MD  Vitamin D, Ergocalciferol, (DRISDOL) 50000 UNITS CAPS capsule Take 50,000 Units by mouth every 30 (thirty) days.    Historical Provider, MD    Social History:  reports that she quit smoking about 2 years ago. She does  not have any smokeless tobacco history on file. She reports that she does not drink alcohol or use illicit drugs.    Filed Weights   11/05/15 1907  Weight: 55.339 kg (122 lb)    All the positives are listed in BOLD  Review of Systems:  HEENT: Headache, blurred vision, runny nose, sore throat Neck: Hypothyroidism, hyperthyroidism,,lymphadenopathy Chest : Shortness of breath, history of COPD, Asthma Heart : Chest pain, history of coronary arterey disease GI:  Nausea, vomiting, diarrhea, constipation, GERD GU: Dysuria, urgency, frequency of urination, hematuria Neuro: Stroke, seizures, syncope Psych: Depression, anxiety, hallucinations   Physical Exam: Blood pressure 122/72, pulse 107, temperature 98.1 F (36.7 C), temperature source Oral, resp. rate 18, height 5' (1.524 m), weight 55.339 kg (122 lb), SpO2 93 %. Constitutional:   Patient is a well-developed and well-nourished female  in no acute distress and cooperative with exam. Head: Normocephalic and atraumatic Mouth: Mucus membranes moist Eyes: PERRL, EOMI, conjunctivae normal Neck: Supple, No Thyromegaly Cardiovascular: RRR, S1 normal, S2 normal Pulmonary/Chest: Bilateral rhonchi Abdominal: Soft. Non-tender, non-distended, bowel sounds are normal, no masses, organomegaly, or guarding present.  Neurological: A&O x3, Strength is normal and symmetric bilaterally, cranial nerve II-XII are grossly intact, no focal motor deficit, sensory intact to light touch bilaterally.  Extremities : No Cyanosis, Clubbing or Edema  Labs on Admission:  Basic Metabolic Panel:  Recent Labs Lab 11/05/15 2248  NA 138  K 3.6  CL 99*  CO2 28  GLUCOSE 150*  BUN 6  CREATININE 0.55  CALCIUM 9.1   CBC:  Recent Labs Lab 11/05/15 2248  WBC 16.7*  HGB 12.0  HCT 36.1  MCV 86.8  PLT 323    BNP (last 3 results)  Recent Labs  11/07/14 0018  BNP 76     Radiological Exams on Admission: Dg Chest 2 View  11/05/2015  CLINICAL DATA:   Short of breath. Productive cough for 2-3 weeks, worse today. History of COPD and hypertension. EXAM: CHEST  2 VIEW COMPARISON:  08/09/2015 FINDINGS: Cardiac silhouette normal in size and configuration. Normal mediastinal and hilar contours. Lungs are hyperexpanded. Mild scarring noted in the upper lobes at the apices. No lung consolidation or edema. No pleural effusion or pneumothorax. Bony thorax is demineralized but grossly intact. IMPRESSION: No acute cardiopulmonary disease. Hyperexpanded lungs consistent with COPD. Electronically Signed   By: Lajean Manes M.D.   On: 11/05/2015 19:40    EKG: Independently reviewed. Normal sinus rhythm   Assessment/Plan Active Problems:   COPD with acute exacerbation (HCC)   COPD exacerbation (HCC)   Hypertension  COPD exacerbation Admit the patient under observation, start solid Medrol 16 g IV every 6 hours, Xopenex nebulizer every 6 hours, ipratropium nebulizer every 6 hours, Mucinex 1 tablet by mouth twice a day. Levaquin 500 mg by mouth daily  Hypertension Continue amlodipine  DVT prophylaxis Lovenox  Code status: Full code  Family discussion: No family at bedside   Time Spent on Admission: 33 min  [facility] Pager: [phone] 11/06/2015, 1:26 AM  If 7PM-7AM, please contact night-coverage  [email or web address]  Password [email or web address]

## 2015-11-07 DIAGNOSIS — J9621 Acute and chronic respiratory failure with hypoxia: Secondary | ICD-10-CM | POA: Diagnosis present

## 2015-11-07 DIAGNOSIS — J9610 Chronic respiratory failure, unspecified whether with hypoxia or hypercapnia: Secondary | ICD-10-CM | POA: Diagnosis present

## 2015-11-07 DIAGNOSIS — E782 Mixed hyperlipidemia: Secondary | ICD-10-CM | POA: Diagnosis present

## 2015-11-07 DIAGNOSIS — J9611 Chronic respiratory failure with hypoxia: Secondary | ICD-10-CM | POA: Diagnosis present

## 2015-11-07 DIAGNOSIS — J441 Chronic obstructive pulmonary disease with (acute) exacerbation: Principal | ICD-10-CM

## 2015-11-07 DIAGNOSIS — D509 Iron deficiency anemia, unspecified: Secondary | ICD-10-CM | POA: Diagnosis present

## 2015-11-07 DIAGNOSIS — E785 Hyperlipidemia, unspecified: Secondary | ICD-10-CM | POA: Diagnosis present

## 2015-11-07 DIAGNOSIS — I10 Essential (primary) hypertension: Secondary | ICD-10-CM

## 2015-11-07 MED ORDER — PREDNISONE 20 MG PO TABS: 20.0000 mg | ORAL_TABLET | Freq: Every day | ORAL | Status: DC

## 2015-11-07 MED ORDER — GUAIFENESIN ER 600 MG PO TB12: 600.0000 mg | ORAL_TABLET | Freq: Two times a day (BID) | ORAL | Status: DC

## 2015-11-07 NOTE — Discharge Summary (Signed)
Physician Discharge Summary  [name] [id number] DOB: 07/30/1956 DOA: 11/05/2015  PCP: Joanie Coddington, MD  Admit date: 11/05/2015 Discharge date: 11/07/2015  Time spent: <30 minutes  Recommendations for Outpatient Follow-up:   Discharge home on oral prednisone taper over next 12 days. Oral levaquin for next 5 days   Discharge Diagnoses:  Active Problems:   COPD with acute exacerbation ([place])  Active problems Acute on chronic hypoxic respiratory failure  iron deficiency anemia   Hypertension   Hyperlipidemia      Discharge Condition: Fair  Diet recommendation: Low sodium  Filed Weights   11/05/15 1907 11/06/15 0000  Weight: 55.339 kg (122 lb) 54.114 kg (119 lb 4.8 oz)    History of present illness:  Please refer to admission H&P for details, in brief,59 year old female with history of COPD on home O2 (quit smoking 2 years back), hypertension, hyperlipidemia presenting with COPD exacerbation. Patient reports shortness of breath for almost 3 weeks and was seen by PCP prescribed her a course of prednisone and doxycycline which she completed but did not relieve her symptoms. She then went to her PCP on the day prior to admission and was started on Levaquin but since he had persistent shortness of breath with productive green sputum she came to the ED. In the ED she was given oral prednisone and albuterol nebulizer. Her O2 sat dropped to 89% on 2 L on exertion. Admitted for further management. Patient denies any sick contacts, recent travel.  Hospital Course:  Acute COPD exacerbation. Placed on On IV Solu-Medrol, scheduled DuoNeb, antitussives and empiric Levaquin. Has quit smoking for the past 2 years but her boyfriend smokes. instructed that he should be to leave him to quit smoking or not smoking inside the house. Patient reports she has not had her flu shot and refuses taking it stating she got sick when she had the vaccine few years ago and since stopped taking the  vaccine. -Symptoms have much improved today. I will discharge her on oral prednisone taper over next 12 days. Her PCP had prescribed her Levaquin on the day prior to admission and would recommend her to continue and finish the course. Have prescribed her antitussives as well. She should continue using her inhalers and when necessary Xopenex nebs. Instructed to keep herself warm.   Essential Hypertension Stable. Continue home blood pressure medication  Hyperlipidemia Continue statin.   Code Status: Full code Family Communication: None at bedside Disposition Plan: Home   Consultants:  None  Procedures:  None  Antibiotics:  Levaquin  Discharge Exam: Filed Vitals:   11/06/15 2139 11/07/15 0517  BP: 119/71 102/57  Pulse: 99 96  Temp: 98 F (36.7 C) 98.3 F (36.8 C)  Resp: 18 18    General: Middle aged female not in distress HEENT: No pallor, moist oral mucosa Chest: Coarse breath sounds at lung bases, no added sounds CVS: Normal S1 and S2, no murmurs GI: Soft, nondistended, nontender Musculoskeletal: Warm, no edema  Discharge Instructions    Current Discharge Medication List    START taking these medications   Details  guaiFENesin (MUCINEX) 600 MG 12 hr tablet Take 1 tablet (600 mg total) by mouth 2 (two) times daily. Qty: 10 tablet, Refills: 0      CONTINUE these medications which have CHANGED   Details  predniSONE (DELTASONE) 20 MG tablet Take 1 tablet (20 mg total) by mouth daily with breakfast. Qty: 16 tablet, Refills: 0  Take 40 mg daily for 3 days, then 30 mg daily  for next 3 days, then 20 mg daily for next 3 days then 10 mg daily for next 3 days then stop    CONTINUE these medications which have NOT CHANGED   Details  albuterol (PROVENTIL HFA;VENTOLIN HFA) 108 (90 BASE) MCG/ACT inhaler Inhale 4-6 puffs into the lungs every 6 (six) hours as needed for wheezing or shortness of breath.    amLODipine (NORVASC) 5 MG tablet Take 5 mg by mouth at  bedtime.    ferrous sulfate 325 (65 FE) MG tablet Take 325 mg by mouth 2 (two) times daily with a meal.     gabapentin (NEURONTIN) 300 MG capsule Take 300-600 mg by mouth 3 (three) times daily. 1cap in the am and 1 cap in the afternoon and 2cap at bedtime    ipratropium (ATROVENT HFA) 17 MCG/ACT inhaler Inhale 2 puffs into the lungs every 4 (four) hours as needed for wheezing.    levalbuterol (XOPENEX) 1.25 MG/3ML nebulizer solution Take 1.25 mg by nebulization every 6 (six) hours as needed for wheezing or shortness of breath.     levofloxacin (LEVAQUIN) 500 MG tablet Take 500 mg by mouth daily.    mometasone-formoterol (DULERA) 100-5 MCG/ACT AERO Inhale 2 puffs into the lungs 2 (two) times daily.    nortriptyline (PAMELOR) 10 MG capsule Take 10 mg by mouth at bedtime.    ranitidine (ZANTAC) 150 MG tablet Take 150 mg by mouth 2 (two) times daily.    simvastatin (ZOCOR) 40 MG tablet Take 40 mg by mouth at bedtime.    tiotropium (SPIRIVA) 18 MCG inhalation capsule Place 18 mcg into inhaler and inhale daily.    ondansetron (ZOFRAN) 8 MG tablet Take 1 tablet (8 mg total) by mouth every 8 (eight) hours as needed for nausea or vomiting. Qty: 10 tablet, Refills: 0    OXYGEN Inhale 2 L into the lungs continuous.    Vitamin D, Ergocalciferol, (DRISDOL) 50000 UNITS CAPS capsule Take 50,000 Units by mouth every 30 (thirty) days.      STOP taking these medications     azithromycin (ZITHROMAX) 250 MG tablet      mometasone-formoterol (DULERA) 200-5 MCG/ACT AERO      oxyCODONE-acetaminophen (ROXICET) 5-325 MG per tablet        No Known Allergies Follow-up Information    Follow up with WEEKS,CYNTHIA, MD. Schedule an appointment as soon as possible for a visit in 1 week.   Specialty:  Family Medicine       The results of significant diagnostics from this hospitalization (including imaging, microbiology, ancillary and laboratory) are listed below for reference.    Significant  Diagnostic Studies: Dg Chest 2 View  11/05/2015  CLINICAL DATA:  Short of breath. Productive cough for 2-3 weeks, worse today. History of COPD and hypertension. EXAM: CHEST  2 VIEW COMPARISON:  08/09/2015 FINDINGS: Cardiac silhouette normal in size and configuration. Normal mediastinal and hilar contours. Lungs are hyperexpanded. Mild scarring noted in the upper lobes at the apices. No lung consolidation or edema. No pleural effusion or pneumothorax. Bony thorax is demineralized but grossly intact. IMPRESSION: No acute cardiopulmonary disease. Hyperexpanded lungs consistent with COPD. Electronically Signed   By: Lajean Manes M.D.   On: 11/05/2015 19:40    Microbiology: No results found for this or any previous visit (from the past 240 hour(s)).   Labs: Basic Metabolic Panel:  Recent Labs Lab 11/05/15 2248 11/06/15 0618  NA 138 138  K 3.6 3.8  CL 99* 99*  CO2 28 28  GLUCOSE 150* 162*  BUN 6 7  CREATININE 0.55 0.65  CALCIUM 9.1 9.1   Liver Function Tests:  Recent Labs Lab 11/06/15 0618  AST 29  ALT 11*  ALKPHOS 80  BILITOT 0.4  PROT 7.2  ALBUMIN 3.7   No results for input(s): LIPASE, AMYLASE in the last 168 hours. No results for input(s): AMMONIA in the last 168 hours. CBC:  Recent Labs Lab 11/05/15 2248 11/06/15 0618  WBC 16.7* 12.2*  HGB 12.0 11.9*  HCT 36.1 35.3*  MCV 86.8 85.9  PLT 323 318   Cardiac Enzymes: No results for input(s): CKTOTAL, CKMB, CKMBINDEX, TROPONINI in the last 168 hours. BNP: BNP (last 3 results) No results for input(s): BNP in the last 8760 hours.  ProBNP (last 3 results) No results for input(s): PROBNP in the last 8760 hours.  CBG: No results for input(s): GLUCAP in the last 168 hours.     SignedLouellen Molder  [facility] 11/07/2015, 8:29 AM

## 2015-11-07 NOTE — Progress Notes (Addendum)
Patient with orders to be discharged home. Discharge instructions given, patient verbalized understanding. Prescriptions given. Patient stable. Patient left in private vehicle with family. Patient had home portable oxygen.

## 2015-11-07 NOTE — Discharge Instructions (Signed)
Chronic Obstructive Pulmonary Disease Chronic obstructive pulmonary disease (COPD) is a common lung condition in which airflow from the lungs is limited. COPD is a general term that can be used to describe many different lung problems that limit airflow, including both chronic bronchitis and emphysema. If you have COPD, your lung function will probably never return to normal, but there are measures you can take to improve lung function and make yourself feel better. CAUSES   Smoking (common).  Exposure to secondhand smoke.  Genetic problems.  Chronic inflammatory lung diseases or recurrent infections. SYMPTOMS  Shortness of breath, especially with physical activity.  Deep, persistent (chronic) cough with a large amount of thick mucus.  Wheezing.  Rapid breaths (tachypnea).  Gray or bluish discoloration (cyanosis) of the skin, especially in your fingers, toes, or lips.  Fatigue.  Weight loss.  Frequent infections or episodes when breathing symptoms become much worse (exacerbations).  Chest tightness. DIAGNOSIS Your health care provider will take a medical history and perform a physical examination to diagnose COPD. Additional tests for COPD may include:  Lung (pulmonary) function tests.  Chest X-ray.  CT scan.  Blood tests. TREATMENT  Treatment for COPD may include:  Inhaler and nebulizer medicines. These help manage the symptoms of COPD and make your breathing more comfortable.  Supplemental oxygen. Supplemental oxygen is only helpful if you have a low oxygen level in your blood.  Exercise and physical activity. These are beneficial for nearly all people with COPD.  Lung surgery or transplant.  Nutrition therapy to gain weight, if you are underweight.  Pulmonary rehabilitation. This may involve working with a team of health care providers and specialists, such as respiratory, occupational, and physical therapists. HOME CARE INSTRUCTIONS  Take all medicines  (inhaled or pills) as directed by your health care provider.  Avoid over-the-counter medicines or cough syrups that dry up your airway (such as antihistamines) and slow down the elimination of secretions unless instructed otherwise by your health care provider.  If you are a smoker, the most important thing that you can do is stop smoking. Continuing to smoke will cause further lung damage and breathing trouble. Ask your health care provider for help with quitting smoking. He or she can direct you to community resources or hospitals that provide support.  Avoid exposure to irritants such as smoke, chemicals, and fumes that aggravate your breathing.  Use oxygen therapy and pulmonary rehabilitation if directed by your health care provider. If you require home oxygen therapy, ask your health care provider whether you should purchase a pulse oximeter to measure your oxygen level at home.  Avoid contact with individuals who have a contagious illness.  Avoid extreme temperature and humidity changes.  Eat healthy foods. Eating smaller, more frequent meals and resting before meals may help you maintain your strength.  Stay active, but balance activity with periods of rest. Exercise and physical activity will help you maintain your ability to do things you want to do.  Preventing infection and hospitalization is very important when you have COPD. Make sure to receive all the vaccines your health care provider recommends, especially the pneumococcal and influenza vaccines. Ask your health care provider whether you need a pneumonia vaccine.  Learn and use relaxation techniques to manage stress.  Learn and use controlled breathing techniques as directed by your health care provider. Controlled breathing techniques include:  Pursed lip breathing. Start by breathing in (inhaling) through your nose for 1 second. Then, purse your lips as if you were   going to whistle and breathe out (exhale) through the  pursed lips for 2 seconds.  Diaphragmatic breathing. Start by putting one hand on your abdomen just above your waist. Inhale slowly through your nose. The hand on your abdomen should move out. Then purse your lips and exhale slowly. You should be able to feel the hand on your abdomen moving in as you exhale.  Learn and use controlled coughing to clear mucus from your lungs. Controlled coughing is a series of short, progressive coughs. The steps of controlled coughing are: 1. Lean your head slightly forward. 2. Breathe in deeply using diaphragmatic breathing. 3. Try to hold your breath for 3 seconds. 4. Keep your mouth slightly open while coughing twice. 5. Spit any mucus out into a tissue. 6. Rest and repeat the steps once or twice as needed. SEEK MEDICAL CARE IF:  You are coughing up more mucus than usual.  There is a change in the color or thickness of your mucus.  Your breathing is more labored than usual.  Your breathing is faster than usual. SEEK IMMEDIATE MEDICAL CARE IF:  You have shortness of breath while you are resting.  You have shortness of breath that prevents you from:  Being able to talk.  Performing your usual physical activities.  You have chest pain lasting longer than 5 minutes.  Your skin color is more cyanotic than usual.  You measure low oxygen saturations for longer than 5 minutes with a pulse oximeter. MAKE SURE YOU:  Understand these instructions.  Will watch your condition.  Will get help right away if you are not doing well or get worse.   This information is not intended to replace advice given to you by your health care provider. Make sure you discuss any questions you have with your health care provider.   Document Released: 08/14/2005 Document Revised: 11/25/2014 Document Reviewed: 07/01/2013 Elsevier Interactive Patient Education 2016 [employer]  

## 2015-11-23 ENCOUNTER — Other Ambulatory Visit: Payer: Self-pay | Admitting: Preventative Medicine

## 2015-11-23 DIAGNOSIS — R918 Other nonspecific abnormal finding of lung field: Secondary | ICD-10-CM

## 2015-11-28 ENCOUNTER — Ambulatory Visit: Admission: RE | Admit: 2015-11-28 | Payer: Medicare HMO | Source: Ambulatory Visit

## 2015-11-28 ENCOUNTER — Ambulatory Visit: Admission: RE | Admit: 2015-11-28 | Payer: Medicare HMO | Source: Ambulatory Visit | Admitting: *Deleted

## 2015-11-28 ENCOUNTER — Ambulatory Visit
Admission: RE | Admit: 2015-11-28 | Discharge: 2015-11-28 | Disposition: A | Discharge status: Home / Self Care | Payer: Medicare HMO | Source: Ambulatory Visit | Attending: Preventative Medicine | Admitting: Preventative Medicine

## 2015-11-28 DIAGNOSIS — I2510 Atherosclerotic heart disease of native coronary artery without angina pectoris: Secondary | ICD-10-CM | POA: Diagnosis not present

## 2015-11-28 DIAGNOSIS — R0602 Shortness of breath: Secondary | ICD-10-CM | POA: Insufficient documentation

## 2015-11-28 DIAGNOSIS — J479 Bronchiectasis, uncomplicated: Secondary | ICD-10-CM | POA: Diagnosis not present

## 2015-11-28 DIAGNOSIS — R05 Cough: Secondary | ICD-10-CM | POA: Diagnosis not present

## 2015-11-28 DIAGNOSIS — R918 Other nonspecific abnormal finding of lung field: Secondary | ICD-10-CM | POA: Diagnosis present

## 2016-01-04 ENCOUNTER — Other Ambulatory Visit: Payer: Self-pay | Admitting: Gastroenterology

## 2016-01-04 DIAGNOSIS — K219 Gastro-esophageal reflux disease without esophagitis: Secondary | ICD-10-CM

## 2016-01-09 ENCOUNTER — Ambulatory Visit
Admission: RE | Admit: 2016-01-09 | Discharge: 2016-01-09 | Disposition: A | Discharge status: Home / Self Care | Payer: Medicare HMO | Source: Ambulatory Visit | Attending: Gastroenterology | Admitting: Gastroenterology

## 2016-01-09 DIAGNOSIS — K449 Diaphragmatic hernia without obstruction or gangrene: Secondary | ICD-10-CM | POA: Insufficient documentation

## 2016-01-09 DIAGNOSIS — K219 Gastro-esophageal reflux disease without esophagitis: Secondary | ICD-10-CM | POA: Diagnosis present

## 2016-01-18 ENCOUNTER — Emergency Department (HOSPITAL_COMMUNITY): Payer: Medicare HMO

## 2016-01-18 ENCOUNTER — Inpatient Hospital Stay (HOSPITAL_COMMUNITY)
Admission: EM | Admit: 2016-01-18 | Discharge: 2016-01-23 | DRG: 871 | Disposition: A | Discharge status: Home / Self Care | Payer: Medicare HMO | Attending: Family Medicine | Admitting: Family Medicine

## 2016-01-18 ENCOUNTER — Encounter (HOSPITAL_COMMUNITY): Payer: Self-pay | Admitting: *Deleted

## 2016-01-18 DIAGNOSIS — J440 Chronic obstructive pulmonary disease with acute lower respiratory infection: Secondary | ICD-10-CM | POA: Diagnosis present

## 2016-01-18 DIAGNOSIS — F17210 Nicotine dependence, cigarettes, uncomplicated: Secondary | ICD-10-CM | POA: Diagnosis present

## 2016-01-18 DIAGNOSIS — D539 Nutritional anemia, unspecified: Secondary | ICD-10-CM | POA: Diagnosis present

## 2016-01-18 DIAGNOSIS — Z8249 Family history of ischemic heart disease and other diseases of the circulatory system: Secondary | ICD-10-CM | POA: Diagnosis not present

## 2016-01-18 DIAGNOSIS — A419 Sepsis, unspecified organism: Secondary | ICD-10-CM | POA: Diagnosis not present

## 2016-01-18 DIAGNOSIS — I959 Hypotension, unspecified: Secondary | ICD-10-CM | POA: Diagnosis present

## 2016-01-18 DIAGNOSIS — G629 Polyneuropathy, unspecified: Secondary | ICD-10-CM | POA: Diagnosis present

## 2016-01-18 DIAGNOSIS — E7800 Pure hypercholesterolemia, unspecified: Secondary | ICD-10-CM | POA: Diagnosis present

## 2016-01-18 DIAGNOSIS — E785 Hyperlipidemia, unspecified: Secondary | ICD-10-CM | POA: Diagnosis present

## 2016-01-18 DIAGNOSIS — Z9981 Dependence on supplemental oxygen: Secondary | ICD-10-CM

## 2016-01-18 DIAGNOSIS — I10 Essential (primary) hypertension: Secondary | ICD-10-CM | POA: Diagnosis present

## 2016-01-18 DIAGNOSIS — J449 Chronic obstructive pulmonary disease, unspecified: Secondary | ICD-10-CM

## 2016-01-18 DIAGNOSIS — R51 Headache: Secondary | ICD-10-CM | POA: Diagnosis not present

## 2016-01-18 DIAGNOSIS — E876 Hypokalemia: Secondary | ICD-10-CM | POA: Diagnosis not present

## 2016-01-18 DIAGNOSIS — Z820 Family history of epilepsy and other diseases of the nervous system: Secondary | ICD-10-CM | POA: Diagnosis not present

## 2016-01-18 DIAGNOSIS — J9600 Acute respiratory failure, unspecified whether with hypoxia or hypercapnia: Secondary | ICD-10-CM | POA: Diagnosis present

## 2016-01-18 DIAGNOSIS — F329 Major depressive disorder, single episode, unspecified: Secondary | ICD-10-CM | POA: Diagnosis present

## 2016-01-18 DIAGNOSIS — D72819 Decreased white blood cell count, unspecified: Secondary | ICD-10-CM | POA: Diagnosis not present

## 2016-01-18 DIAGNOSIS — K219 Gastro-esophageal reflux disease without esophagitis: Secondary | ICD-10-CM | POA: Diagnosis present

## 2016-01-18 DIAGNOSIS — J189 Pneumonia, unspecified organism: Secondary | ICD-10-CM

## 2016-01-18 DIAGNOSIS — N179 Acute kidney failure, unspecified: Secondary | ICD-10-CM | POA: Diagnosis present

## 2016-01-18 DIAGNOSIS — Z825 Family history of asthma and other chronic lower respiratory diseases: Secondary | ICD-10-CM | POA: Diagnosis not present

## 2016-01-18 DIAGNOSIS — E871 Hypo-osmolality and hyponatremia: Secondary | ICD-10-CM | POA: Diagnosis present

## 2016-01-18 DIAGNOSIS — Z7951 Long term (current) use of inhaled steroids: Secondary | ICD-10-CM | POA: Diagnosis not present

## 2016-01-18 DIAGNOSIS — R05 Cough: Secondary | ICD-10-CM | POA: Diagnosis not present

## 2016-01-18 LAB — I-STAT CG4 LACTIC ACID, ED
LACTIC ACID, VENOUS: 1 mmol/L (ref 0.5–2.0)
LACTIC ACID, VENOUS: 2.07 mmol/L — AB (ref 0.5–2.0)

## 2016-01-18 LAB — CBC WITH DIFFERENTIAL/PLATELET
BASOS PCT: 0 %
Basophils Absolute: 0 10*3/uL (ref 0.0–0.1)
EOS ABS: 0 10*3/uL (ref 0.0–0.7)
EOS PCT: 0 %
HCT: 37.9 % (ref 36.0–46.0)
Hemoglobin: 12.8 g/dL (ref 12.0–15.0)
Lymphocytes Relative: 7 %
Lymphs Abs: 1 10*3/uL (ref 0.7–4.0)
MCH: 28.4 pg (ref 26.0–34.0)
MCHC: 33.8 g/dL (ref 30.0–36.0)
MCV: 84.2 fL (ref 78.0–100.0)
Monocytes Absolute: 1 10*3/uL (ref 0.1–1.0)
Monocytes Relative: 7 %
NEUTROS PCT: 86 %
Neutro Abs: 12.8 10*3/uL — ABNORMAL HIGH (ref 1.7–7.7)
PLATELETS: 180 10*3/uL (ref 150–400)
RBC: 4.5 MIL/uL (ref 3.87–5.11)
RDW: 13.4 % (ref 11.5–15.5)
WBC MORPHOLOGY: INCREASED
WBC: 14.8 10*3/uL — AB (ref 4.0–10.5)

## 2016-01-18 LAB — BLOOD GAS, ARTERIAL
ACID-BASE DEFICIT: 2.1 mmol/L — AB (ref 0.0–2.0)
BICARBONATE: 23 meq/L (ref 20.0–24.0)
Drawn by: 234301
O2 CONTENT: 3 L/min
O2 Saturation: 91.8 %
PO2 ART: 63.7 mmHg — AB (ref 80.0–100.0)
pCO2 arterial: 31.2 mmHg — ABNORMAL LOW (ref 35.0–45.0)
pH, Arterial: 7.449 (ref 7.350–7.450)

## 2016-01-18 LAB — COMPREHENSIVE METABOLIC PANEL
ALK PHOS: 63 U/L (ref 38–126)
ALT: 23 U/L (ref 14–54)
AST: 54 U/L — ABNORMAL HIGH (ref 15–41)
Albumin: 4.1 g/dL (ref 3.5–5.0)
Anion gap: 14 (ref 5–15)
BILIRUBIN TOTAL: 0.8 mg/dL (ref 0.3–1.2)
BUN: 22 mg/dL — ABNORMAL HIGH (ref 6–20)
CALCIUM: 8.6 mg/dL — AB (ref 8.9–10.3)
CO2: 25 mmol/L (ref 22–32)
CREATININE: 1.04 mg/dL — AB (ref 0.44–1.00)
Chloride: 91 mmol/L — ABNORMAL LOW (ref 101–111)
GFR, EST NON AFRICAN AMERICAN: 58 mL/min — AB (ref >60)
Glucose, Bld: 109 mg/dL — ABNORMAL HIGH (ref 65–99)
Potassium: 2.6 mmol/L — CL (ref 3.5–5.1)
SODIUM: 130 mmol/L — AB (ref 135–145)
TOTAL PROTEIN: 7.4 g/dL (ref 6.5–8.1)

## 2016-01-18 LAB — INFLUENZA PANEL BY PCR (TYPE A & B)
H1N1 flu by pcr: NOT DETECTED
Influenza A By PCR: NEGATIVE
Influenza B By PCR: NEGATIVE

## 2016-01-18 MED ORDER — POTASSIUM CHLORIDE CRYS ER 20 MEQ PO TBCR
60.0000 meq | EXTENDED_RELEASE_TABLET | Freq: Once | ORAL | Status: AC
  Administered 2016-01-18: 60 meq via ORAL
  Filled 2016-01-18: qty 3

## 2016-01-18 MED ORDER — FERROUS SULFATE 325 (65 FE) MG PO TABS
325.0000 mg | ORAL_TABLET | Freq: Two times a day (BID) | ORAL | Status: DC
  Administered 2016-01-18 – 2016-01-23 (×10): 325 mg via ORAL
  Filled 2016-01-18 (×10): qty 1

## 2016-01-18 MED ORDER — OSELTAMIVIR PHOSPHATE 75 MG PO CAPS
75.0000 mg | ORAL_CAPSULE | Freq: Once | ORAL | Status: AC
  Administered 2016-01-18: 75 mg via ORAL
  Filled 2016-01-18: qty 1

## 2016-01-18 MED ORDER — GABAPENTIN 300 MG PO CAPS: 300.0000 mg | ORAL_CAPSULE | Freq: Three times a day (TID) | ORAL | Status: DC

## 2016-01-18 MED ORDER — ACETAMINOPHEN 325 MG PO TABS
650.0000 mg | ORAL_TABLET | Freq: Once | ORAL | Status: AC
  Administered 2016-01-18: 650 mg via ORAL
  Filled 2016-01-18: qty 2

## 2016-01-18 MED ORDER — SODIUM CHLORIDE 0.9 % IV BOLUS (SEPSIS)
1000.0000 mL | INTRAVENOUS | Status: AC
  Administered 2016-01-18 (×2): 1000 mL via INTRAVENOUS

## 2016-01-18 MED ORDER — LEVALBUTEROL HCL 1.25 MG/0.5ML IN NEBU: 1.2500 mg | INHALATION_SOLUTION | Freq: Four times a day (QID) | RESPIRATORY_TRACT | Status: DC | PRN

## 2016-01-18 MED ORDER — DEXTROSE 5 % IV SOLN
500.0000 mg | Freq: Once | INTRAVENOUS | Status: AC
  Administered 2016-01-18: 500 mg via INTRAVENOUS
  Filled 2016-01-18: qty 500

## 2016-01-18 MED ORDER — ENOXAPARIN SODIUM 30 MG/0.3ML ~~LOC~~ SOLN
30.0000 mg | SUBCUTANEOUS | Status: DC
  Administered 2016-01-18 – 2016-01-19 (×2): 30 mg via SUBCUTANEOUS
  Filled 2016-01-18 (×2): qty 0.3

## 2016-01-18 MED ORDER — DEXTROSE 5 % IV SOLN: 500.0000 mg | Freq: Once | INTRAVENOUS | Status: DC

## 2016-01-18 MED ORDER — NORTRIPTYLINE HCL 25 MG PO CAPS
25.0000 mg | ORAL_CAPSULE | Freq: Every day | ORAL | Status: DC
  Administered 2016-01-18 – 2016-01-22 (×5): 25 mg via ORAL
  Filled 2016-01-18 (×5): qty 1

## 2016-01-18 MED ORDER — DEXTROSE 5 % IV SOLN: 500.0000 mg | INTRAVENOUS | Status: DC

## 2016-01-18 MED ORDER — GABAPENTIN 300 MG PO CAPS
600.0000 mg | ORAL_CAPSULE | Freq: Every day | ORAL | Status: DC
  Administered 2016-01-18 – 2016-01-20 (×3): 600 mg via ORAL
  Filled 2016-01-18: qty 6
  Filled 2016-01-18 (×4): qty 2

## 2016-01-18 MED ORDER — DEXTROSE 5 % IV SOLN: 1.0000 g | INTRAVENOUS | Status: DC

## 2016-01-18 MED ORDER — GABAPENTIN 300 MG PO CAPS
300.0000 mg | ORAL_CAPSULE | Freq: Two times a day (BID) | ORAL | Status: DC
  Administered 2016-01-19 – 2016-01-22 (×7): 300 mg via ORAL
  Filled 2016-01-18 (×7): qty 1

## 2016-01-18 MED ORDER — FLUTICASONE FUROATE-VILANTEROL 100-25 MCG/INH IN AEPB
1.0000 | INHALATION_SPRAY | Freq: Every day | RESPIRATORY_TRACT | Status: DC
  Administered 2016-01-20 – 2016-01-23 (×4): 1 via RESPIRATORY_TRACT
  Filled 2016-01-18: qty 28

## 2016-01-18 MED ORDER — DEXTROSE 5 % IV SOLN
500.0000 mg | INTRAVENOUS | Status: DC
  Administered 2016-01-19 – 2016-01-21 (×3): 500 mg via INTRAVENOUS
  Filled 2016-01-18 (×6): qty 500

## 2016-01-18 MED ORDER — IPRATROPIUM BROMIDE 0.02 % IN SOLN: 2.5000 mL | RESPIRATORY_TRACT | Status: DC | PRN

## 2016-01-18 MED ORDER — DEXTROSE 5 % IV SOLN
1.0000 g | Freq: Once | INTRAVENOUS | Status: AC
  Administered 2016-01-18: 1 g via INTRAVENOUS
  Filled 2016-01-18: qty 10

## 2016-01-18 MED ORDER — POTASSIUM CHLORIDE IN NACL 20-0.9 MEQ/L-% IV SOLN
INTRAVENOUS | Status: DC
  Administered 2016-01-18 – 2016-01-21 (×6): via INTRAVENOUS

## 2016-01-18 MED ORDER — DEXTROSE 5 % IV SOLN
1.0000 g | INTRAVENOUS | Status: DC
  Administered 2016-01-19 – 2016-01-21 (×3): 1 g via INTRAVENOUS
  Filled 2016-01-18 (×4): qty 10

## 2016-01-18 NOTE — H&P (Signed)
continue the same copd regimen as is (dulera, atrovent albuterol, stop spiriva instead), due to insurance next need to switch dulera to breo ellipta -continue oxygen at 2 liters   [facility] History and Physical  [name] [id number] DOB: October 28, 1956 DOA: 01/18/2016  Referring physician: Dr Jeanell Sparrow PCP: Joanie Coddington, MD  Specialists:  none  Chief Complaint: COugh weak  HPI:  60 y/o ? 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 Myalgias no fever no chills nor rigors Productive cough with greenish sputum No ill contacts no other sick contacts, no rash ? flu shot this year Nonsmoker Compliant with medications. More short of breath on lying flat over the past 24 hours no lower extremity edema No dark stool no tarry stool no chills no vomiting  Because of continued feeling poorly came to the emergency room initially noted hypotensive blood pressure 80 systolic, rectal temp [id number], tachycardic 118, blood gas showed pH 7.44 PCO2 31 PaO2 63. Patient was altered and seemed to be lethargic per emergency room physician CT scan head no acute infarct chest x-ray 1 view = interstitial pneumonia in the bases? Patient volume resuscitated with saline 1 liter fluid bolus Sodium 130 potassium 2.6 BUN/creatinine 22/1.03 AST 54 lactic acid 2. 07-->1.0 with resuscitation White count 14 platelet 180 hemoglobin 12.8  FAMILY HISTORY: Family History  Problem Relation Age of Onset  . Alzheimer's disease Mother  . Coronary artery disease Father  . Alzheimer's disease Other  . COPD Other  . Coronary artery disease Other  Negative for colorectal cancer, colon polyps, liver disease, PUD.  SOCIAL HISTORY:  . Smoking status: Former Smoker  Types: Cigarettes  . Smokeless tobacco: Never Used   . Alcohol use 0.0 oz/week  0 Standard drinks or equivalent per week  Comment: Rarely  . Drug use: No  . Sexual activity: Defer    Past Medical History  Diagnosis Date  . Hypertension   . High cholesterol   . COPD (chronic obstructive pulmonary disease) [facility])    Past Surgical History  Procedure Laterality Date  . Hallux valgus base wedge Right 06/09/2015    Procedure: Base wedge osteotomy with modified McBride right foot ;  Surgeon: Sharlotte Alamo, MD;  Location: [facility] ORS;  Service: Podiatry;  Laterality: Right;   Social History:  Social History   Social History Narrative    No Known Allergies  No family history on file.   Prior to Admission medications   Medication Sig Start Date End Date Taking? Authorizing Provider  albuterol (PROVENTIL HFA;VENTOLIN HFA) 108 (90 BASE) MCG/ACT inhaler Inhale 4-6 puffs into the lungs every 6 (six) hours as needed for wheezing or shortness of breath.   Yes Historical Provider, MD  amLODipine (NORVASC) 5 MG tablet Take 5 mg by mouth at bedtime.   Yes Historical Provider, MD  ferrous sulfate 325 (65 FE) MG tablet Take 325 mg by mouth 2 (two) times daily with a meal.    Yes Historical Provider, MD  fluticasone furoate-vilanterol (BREO ELLIPTA) 100-25 MCG/INH AEPB Inhale 1 puff into the lungs daily.   Yes Historical Provider, MD  gabapentin (NEURONTIN) 300 MG capsule Take 300-600 mg by mouth 3 (three) times daily. 1cap in the am and 1 cap in the afternoon and 2cap at bedtime  Yes Historical Provider, MD  ibuprofen (ADVIL,MOTRIN) 400 MG tablet Take 400 mg by mouth every 6 (six) hours as needed for moderate pain.   Yes Historical Provider, MD  ipratropium (ATROVENT HFA) 17 MCG/ACT inhaler Inhale 2 puffs into the lungs every 4 (four) hours as needed for wheezing.   Yes Historical Provider, MD  levalbuterol (XOPENEX) 1.25 MG/3ML nebulizer solution Take 1.25 mg by nebulization every 6 (six) hours as needed for wheezing or shortness of breath.    Yes  Historical Provider, MD  nortriptyline (PAMELOR) 25 MG capsule Take 25 mg by mouth at bedtime.   Yes Historical Provider, MD  simvastatin (ZOCOR) 40 MG tablet Take 40 mg by mouth at bedtime.   Yes Historical Provider, MD  tiotropium (SPIRIVA) 18 MCG inhalation capsule Place 18 mcg into inhaler and inhale daily.   Yes Historical Provider, MD  guaiFENesin (MUCINEX) 600 MG 12 hr tablet Take 1 tablet (600 mg total) by mouth 2 (two) times daily. Patient not taking: Reported on 01/18/2016 11/07/15   Nishant Dhungel, MD  ondansetron (ZOFRAN) 8 MG tablet Take 1 tablet (8 mg total) by mouth every 8 (eight) hours as needed for nausea or vomiting. Patient not taking: Reported on 01/18/2016 06/24/15   Tammy Triplett, PA-C  OXYGEN Inhale 2 L into the lungs continuous.    Historical Provider, MD  predniSONE (DELTASONE) 20 MG tablet Take 1 tablet (20 mg total) by mouth daily with breakfast. Patient not taking: Reported on 01/18/2016 11/07/15   Louellen Molder, MD   Physical Exam: Filed Vitals:   01/18/16 1300 01/18/16 1330 01/18/16 1356 01/18/16 1430  BP: 86/51 98/59  107/85  Pulse: 100 97  92  Temp:   101.2 F (38.4 C)   TempSrc:   Rectal   Resp: 14 15  16   SpO2: 98% 95%  96%    Alert pleasant oriented no apparent distress EOMI NCAT dry mucosa Dentures in situ upper No exudate on tongue No JVD Clinically mild wheeze but no TVR no TVF Abdomen soft nontender nondistended no rebound no guarding  no lower extremity edema   Labs on Admission:  Basic Metabolic Panel:  Recent Labs Lab 01/18/16 1200  NA 130*  K 2.6*  CL 91*  CO2 25  GLUCOSE 109*  BUN 22*  CREATININE 1.04*  CALCIUM 8.6*   Liver Function Tests:  Recent Labs Lab 01/18/16 1200  AST 54*  ALT 23  ALKPHOS 63  BILITOT 0.8  PROT 7.4  ALBUMIN 4.1   No results for input(s): LIPASE, AMYLASE in the last 168 hours. No results for input(s): AMMONIA in the last 168 hours. CBC:  Recent Labs Lab 01/18/16 1200  WBC 14.8*   NEUTROABS 12.8*  HGB 12.8  HCT 37.9  MCV 84.2  PLT 180   Cardiac Enzymes: No results for input(s): CKTOTAL, CKMB, CKMBINDEX, TROPONINI in the last 168 hours.  BNP (last 3 results) No results for input(s): BNP in the last 8760 hours.  ProBNP (last 3 results) No results for input(s): PROBNP in the last 8760 hours.  CBG: No results for input(s): GLUCAP in the last 168 hours.  Radiological Exams on Admission: Ct Head Wo Contrast  01/18/2016  CLINICAL DATA:  Confusion/altered mental status following fall 1 day prior EXAM: CT HEAD WITHOUT CONTRAST TECHNIQUE: Contiguous axial images were obtained from the base of the skull through the vertex without intravenous contrast. COMPARISON:  October 09, 2013 FINDINGS: The ventricles are normal in size and configuration. There is no intracranial mass,  hemorrhage, extra-axial fluid collection, or midline shift. Gray-white compartments are normal. No acute infarct evident. Knee calvarium appears intact. Mastoids on the left are clear. There is opacification of multiple inferior mastoid air cells on the right, stable. The visualized orbits appear unremarkable. IMPRESSION: Chronic opacification of several inferior mastoid air cells on the right. Mastoids on the left are clear. No intracranial mass, hemorrhage, or extra-axial fluid collection. Gray-white compartments appear normal. Electronically Signed   By: Lowella Grip III M.D.   On: 01/18/2016 13:38   Dg Chest Port 1 View  01/18/2016  CLINICAL DATA:  Cough with shortness of breath and fever for 2 days EXAM: PORTABLE CHEST 1 VIEW COMPARISON:  Chest radiograph November 05, 2015 and chest CT November 28, 2015 FINDINGS: There is diffuse interstitial thickening in the floor lung zones bilaterally. There is no frank airspace consolidation. The heart size and pulmonary vascularity are normal. There are patchy areas of lower lobe bronchiectatic change. No adenopathy apparent. No bone lesions. IMPRESSION:  Bibasilar interstitial thickening. Suspect infectious interstitial pneumonia in the bases, although a somewhat atypical appearance of pulmonary edema could present in this manner. Followup PA and lateral chest radiographs recommended in 3-4 weeks following trial of antibiotic therapy to ensure resolution and exclude underlying malignancy. Electronically Signed   By: Lowella Grip III M.D.   On: 01/18/2016 12:30    EKG: Independently reviewed. Sinus tach, no ST-t changes  Assessment/Plan  Acute respiratory failure in the setting of Moderate Sepsis secondary to Community acquired pneumonia-versus viral illness Continue ceftriaxone and azithromycin, rule out flu and continue Tamiflu as well CBC plus differential in a.m. Repeat 2 view x-ray to further delineate whether this is pneumonia Follow blood culture, sputum culture Follow Legionella plus strep pneumo urinary antigen Continue saline with 20 mEq of K at 1 25 cc per hour-received bolus IV fluids downstairs and hypotension is responsive to fluids-can admit to telemetry as is hemodynamically stable DDX pyelonephritis await urine culture  Severe COPD-continue inhaled Xopenex by nebulization every 4 hourly, Spiriva 18 g, hold albuterol HFA for now, Continue Atrovent HFA 2 puffs every 4 when necessary as well as fluticasone inhalation 1 puff daily Continue chronic oxygen 2 L. Do not think she has an acute flare therefore no need for steroids at present time  Hypertension hold on amlodipine 5 for now   Hypovolemic hyponatremia -Secondary to sepsis. We'll continue replacement with IV fluids.  Acute kidney injury Secondary to sepsis and poor perfusion See above Monitor labs in the morning  Depression continue Pamelor 25 daily  Neuropathy continue gabapentin 300-600 3 times a day  Deficiency anemia continue ferrous sulfate 325 twice a day  Hyperlipidemia hold statin for now      Nita Sells [facility] Pager  [phone]  If 7PM-7AM, please contact night-coverage [email or web address] Password [facility] 01/18/2016, 2:47 PM

## 2016-01-18 NOTE — Progress Notes (Signed)
Pharmacy Antibiotic Note  [name] is a 60 y.o. female admitted on 01/18/2016 with pneumonia.  Pharmacy has been consulted for ceftriaxone and azithromycin dosing.  Plan: Cont rocephin 1 gm IV q24 and azithromyicn 500 mg IV q24 hours F/u cultures and clinical course     Temp (24hrs), Avg:102.4 F (39.1 C), Min:100.9 F (38.3 C), Max:103.9 F (39.9 C)  No results for input(s): WBC, CREATININE, LATICACIDVEN, VANCOTROUGH, VANCOPEAK, VANCORANDOM, GENTTROUGH, GENTPEAK, GENTRANDOM, TOBRATROUGH, TOBRAPEAK, TOBRARND, AMIKACINPEAK, AMIKACINTROU, AMIKACIN in the last 168 hours.  CrCl cannot be calculated (Unknown ideal weight.).    No Known Allergies  Thank you for allowing pharmacy to be a part of this patient's care.  Excell Seltzer [place] 01/18/2016 12:12 PM

## 2016-01-18 NOTE — ED Notes (Signed)
Nurse unavailable to report.

## 2016-01-18 NOTE — ED Notes (Signed)
Pt states productive cough, green in color began 2 days ago with increased SOB (Hx of COPD). Chills. Significant other states pt has been "talking out of her head" which began last night.

## 2016-01-18 NOTE — ED Notes (Signed)
CRITICAL VALUE ALERT  Critical value received:  Potassium  Date of notification:  01/18/16  Time of notification:  [id number]  Critical value read back:Yes.    Nurse who received alert:  Maxwell Caul, RN  MD notified (1st page):  Dr Jeanell Sparrow  Time of first page:  [phone]

## 2016-01-18 NOTE — ED Notes (Signed)
O2 increased from 2 to 4L. Pt is on O2 @ 2 L at home at all times.

## 2016-01-18 NOTE — ED Provider Notes (Signed)
CSN: [id number]     Arrival date & time 01/18/16  1128 History  By signing my name below, I, Rohini Rajnarayanan, attest that this documentation has been prepared under the direction and in the presence of Pattricia Boss, MD Electronically Signed: Evonnie Dawes, ED Scribe 01/18/2016 at 12:18 PM. Level 5 caveat- patient confusion   Chief Complaint  Patient presents with  . Code Sepsis   The history is provided by the patient. No language interpreter was used.  HPI Comments: [name] is a 60 y.o. female with COPD, who presents to the Emergency Department complaining of SOB, which began 2 days ago, with an associated productive cough with green discharge. Pt also has chills, rhinorrhea and a sore throat. Pt has not received a flu shot this year. She is currently  On 2L/min of oxygen via [place] at home. Pt has breathing treatments for her COPD that are being used frequently. Pt denies any steroid prescriptions. Pt is not a smoker. Pt's PCP is Dr. Myrtie Soman.  Past Medical History  Diagnosis Date  . Hypertension   . High cholesterol   . COPD (chronic obstructive pulmonary disease) [facility])    Past Surgical History  Procedure Laterality Date  . Hallux valgus base wedge Right 06/09/2015    Procedure: Base wedge osteotomy with modified McBride right foot ;  Surgeon: Sharlotte Alamo, MD;  Location: [facility] ORS;  Service: Podiatry;  Laterality: Right;   No family history on file. Social History  Substance Use Topics  . Smoking status: Former Smoker    Quit date: 11/04/2013  . Smokeless tobacco: None  . Alcohol Use: No   OB History    No data available     Review of Systems  Constitutional: Positive for chills.  HENT: Positive for rhinorrhea and sore throat.   Respiratory: Positive for cough (Productive, green) and shortness of breath.   All other systems reviewed and are negative.  Allergies  Review of patient's allergies indicates no known allergies.  Home Medications   Prior to Admission  medications   Medication Sig Start Date End Date Taking? Authorizing Provider  albuterol (PROVENTIL HFA;VENTOLIN HFA) 108 (90 BASE) MCG/ACT inhaler Inhale 4-6 puffs into the lungs every 6 (six) hours as needed for wheezing or shortness of breath.    Historical Provider, MD  amLODipine (NORVASC) 5 MG tablet Take 5 mg by mouth at bedtime.    Historical Provider, MD  ferrous sulfate 325 (65 FE) MG tablet Take 325 mg by mouth 2 (two) times daily with a meal.     Historical Provider, MD  gabapentin (NEURONTIN) 300 MG capsule Take 300-600 mg by mouth 3 (three) times daily. 1cap in the am and 1 cap in the afternoon and 2cap at bedtime    Historical Provider, MD  guaiFENesin (MUCINEX) 600 MG 12 hr tablet Take 1 tablet (600 mg total) by mouth 2 (two) times daily. 11/07/15   Nishant Dhungel, MD  ipratropium (ATROVENT HFA) 17 MCG/ACT inhaler Inhale 2 puffs into the lungs every 4 (four) hours as needed for wheezing.    Historical Provider, MD  levalbuterol Penne Lash) 1.25 MG/3ML nebulizer solution Take 1.25 mg by nebulization every 6 (six) hours as needed for wheezing or shortness of breath.     Historical Provider, MD  levofloxacin (LEVAQUIN) 500 MG tablet Take 500 mg by mouth daily.    Historical Provider, MD  mometasone-formoterol (DULERA) 100-5 MCG/ACT AERO Inhale 2 puffs into the lungs 2 (two) times daily.    Historical Provider,  MD  nortriptyline (PAMELOR) 10 MG capsule Take 10 mg by mouth at bedtime. 05/30/15   Historical Provider, MD  ondansetron (ZOFRAN) 8 MG tablet Take 1 tablet (8 mg total) by mouth every 8 (eight) hours as needed for nausea or vomiting. 06/24/15   Tammy Triplett, PA-C  OXYGEN Inhale 2 L into the lungs continuous.    Historical Provider, MD  predniSONE (DELTASONE) 20 MG tablet Take 1 tablet (20 mg total) by mouth daily with breakfast. 11/07/15   Nishant Dhungel, MD  ranitidine (ZANTAC) 150 MG tablet Take 150 mg by mouth 2 (two) times daily.    Historical Provider, MD  simvastatin (ZOCOR)  40 MG tablet Take 40 mg by mouth at bedtime.    Historical Provider, MD  tiotropium (SPIRIVA) 18 MCG inhalation capsule Place 18 mcg into inhaler and inhale daily.    Historical Provider, MD  Vitamin D, Ergocalciferol, (DRISDOL) 50000 UNITS CAPS capsule Take 50,000 Units by mouth every 30 (thirty) days.    Historical Provider, MD   BP 81/68 mmHg  Pulse 116  Temp(Src) 100.9 F (38.3 C) (Oral)  Resp 24  SpO2 86% Physical Exam  Constitutional: She appears well-developed and well-nourished. She appears distressed.  Temp 103.9  HENT:  Head: Normocephalic and atraumatic.  Right Ear: External ear normal.  Left Ear: External ear normal.  Mucous membranes are dry  Eyes: Conjunctivae and EOM are normal. Pupils are equal, round, and reactive to light.  Neck: Normal range of motion. Neck supple.  Cardiovascular: Tachycardia present.   Pulmonary/Chest: She has no wheezes. She exhibits no tenderness.  Patient is tachypneic. She is coughing on exam. Breath sounds are diffusely coarse and decreased at bases.  Abdominal: Soft. Bowel sounds are normal.  Musculoskeletal: Normal range of motion. She exhibits no tenderness.  Neurological: She is alert. She displays normal reflexes. No cranial nerve deficit. She exhibits normal muscle tone. Coordination normal.  Patient is alert she is oriented to person and place but not to date.  Skin: Skin is warm and dry.  No rashes or wounds noted  Nursing note and vitals reviewed.   ED Course  Procedures  DIAGNOSTIC STUDIES: Oxygen Saturation is 86% on RA, low by my interpretation.    COORDINATION OF CARE: 11:52 AM-Discussed treatment plan which includes urinalysis, blood work, and DG chest,  with pt at bedside and pt agreed to plan.   Labs Review Labs Reviewed  COMPREHENSIVE METABOLIC PANEL - Abnormal; Notable for the following:    Sodium 130 (*)    Potassium 2.6 (*)    Chloride 91 (*)    Glucose, Bld 109 (*)    BUN 22 (*)    Creatinine, Ser 1.04 (*)     Calcium 8.6 (*)    AST 54 (*)    GFR calc non Af Amer 58 (*)    All other components within normal limits  CBC WITH DIFFERENTIAL/PLATELET - Abnormal; Notable for the following:    WBC 14.8 (*)    Neutro Abs 12.8 (*)    All other components within normal limits  BLOOD GAS, ARTERIAL - Abnormal; Notable for the following:    pCO2 arterial 31.2 (*)    pO2, Arterial 63.7 (*)    Acid-base deficit 2.1 (*)    All other components within normal limits  CULTURE, BLOOD (ROUTINE X 2)  CULTURE, BLOOD (ROUTINE X 2)  URINE CULTURE  URINALYSIS, ROUTINE W REFLEX MICROSCOPIC (NOT AT [facility])  I-STAT CG4 LACTIC ACID, ED  I-STAT CG4 LACTIC  ACID, ED    Imaging Review Ct Head Wo Contrast  01/18/2016  CLINICAL DATA:  Confusion/altered mental status following fall 1 day prior EXAM: CT HEAD WITHOUT CONTRAST TECHNIQUE: Contiguous axial images were obtained from the base of the skull through the vertex without intravenous contrast. COMPARISON:  October 09, 2013 FINDINGS: The ventricles are normal in size and configuration. There is no intracranial mass, hemorrhage, extra-axial fluid collection, or midline shift. Gray-white compartments are normal. No acute infarct evident. Knee calvarium appears intact. Mastoids on the left are clear. There is opacification of multiple inferior mastoid air cells on the right, stable. The visualized orbits appear unremarkable. IMPRESSION: Chronic opacification of several inferior mastoid air cells on the right. Mastoids on the left are clear. No intracranial mass, hemorrhage, or extra-axial fluid collection. Gray-white compartments appear normal. Electronically Signed   By: Lowella Grip III M.D.   On: 01/18/2016 13:38   Dg Chest Port 1 View  01/18/2016  CLINICAL DATA:  Cough with shortness of breath and fever for 2 days EXAM: PORTABLE CHEST 1 VIEW COMPARISON:  Chest radiograph November 05, 2015 and chest CT November 28, 2015 FINDINGS: There is diffuse interstitial thickening in  the floor lung zones bilaterally. There is no frank airspace consolidation. The heart size and pulmonary vascularity are normal. There are patchy areas of lower lobe bronchiectatic change. No adenopathy apparent. No bone lesions. IMPRESSION: Bibasilar interstitial thickening. Suspect infectious interstitial pneumonia in the bases, although a somewhat atypical appearance of pulmonary edema could present in this manner. Followup PA and lateral chest radiographs recommended in 3-4 weeks following trial of antibiotic therapy to ensure resolution and exclude underlying malignancy. Electronically Signed   By: Lowella Grip III M.D.   On: 01/18/2016 12:30   I have personally reviewed and evaluated these images and lab results as part of my medical decision-making.   EKG Interpretation   Date/Time:  Thursday January 18 2016 12:34:32 EST Ventricular Rate:  107 PR Interval:  160 QRS Duration: 82 QT Interval:  327 QTC Calculation: 436 R Axis:   -150 Text Interpretation:  Sinus tachycardia Confirmed by Imelda Dandridge MD, Andee Poles  [phone]) on 01/18/2016 1:25:35 PM      MDM   Final diagnoses:  CAP (community acquired pneumonia)  Chronic obstructive pulmonary disease, unspecified COPD type ([place])    I personally performed the services described in this documentation, which was scribed in my presence. The recorded information has been reviewed and considered.   1- fever- patient with influenza like illness - also probable bibasilar infiltrates.  Patient treated for cap and flu- rocephin, zithromax, and tamiflu. 2- dyspnea- baseline copd, oxygen dependent- infiltrates on cxr-see 1. 3- fever tachycardia hypotension- First lactic acid 2.07.  Patient starting second liter ns 1:41 PM bp 86/51-  Will reevaluate after bolus. 4- confusion- likely metabolic encephalopathy given infection and high fever- doubt meningitis as neck supple , mild c.o. Headache.  Now reports fell oob two nights ago and struck head, head ct  ordered to evaluate for ich. Patient clearer now with temp down and ct head negative.  Plan admission for ongoing treatment.   2:07 PM Filed Vitals:   01/18/16 1330 01/18/16 1356  BP: 98/59   Pulse: 97   Temp:  101.2 F (38.4 C)  Resp: 15      Pattricia Boss, MD 01/18/16 1436

## 2016-01-18 NOTE — ED Notes (Signed)
MD at bedside. 

## 2016-01-18 NOTE — ED Notes (Signed)
Patient attempted to obtain another ua sample but was contaminated with stool mixture.

## 2016-01-18 NOTE — ED Notes (Signed)
Patient transported to CT 

## 2016-01-18 NOTE — ED Notes (Signed)
Nurse unavailable for report.  

## 2016-01-18 NOTE — Progress Notes (Signed)
Pharmacy Note Ms Nevill is on azithromycin and ceftriaxone for CAP.  There is no renal adjustment necessary for these antibiotics.  Pharmacy will sign off.  Please reconsult if needed.  Thank you, Excell Seltzer, PharmD

## 2016-01-19 ENCOUNTER — Inpatient Hospital Stay (HOSPITAL_COMMUNITY): Payer: Medicare HMO

## 2016-01-19 LAB — CBC WITH DIFFERENTIAL/PLATELET
Basophils Absolute: 0 10*3/uL (ref 0.0–0.1)
Basophils Relative: 0 %
Eosinophils Absolute: 0 10*3/uL (ref 0.0–0.7)
Eosinophils Relative: 0 %
HEMATOCRIT: 32.8 % — AB (ref 36.0–46.0)
Hemoglobin: 11.1 g/dL — ABNORMAL LOW (ref 12.0–15.0)
LYMPHS ABS: 1.2 10*3/uL (ref 0.7–4.0)
Lymphocytes Relative: 16 %
MCH: 29 pg (ref 26.0–34.0)
MCHC: 33.8 g/dL (ref 30.0–36.0)
MCV: 85.6 fL (ref 78.0–100.0)
MONOS PCT: 4 %
Monocytes Absolute: 0.3 10*3/uL (ref 0.1–1.0)
NEUTROS ABS: 5.8 10*3/uL (ref 1.7–7.7)
NEUTROS PCT: 79 %
Platelets: 163 10*3/uL (ref 150–400)
RBC: 3.83 MIL/uL — ABNORMAL LOW (ref 3.87–5.11)
RDW: 13.8 % (ref 11.5–15.5)
WBC: 7.4 10*3/uL (ref 4.0–10.5)

## 2016-01-19 LAB — EXPECTORATED SPUTUM ASSESSMENT W REFEX TO RESP CULTURE: SPECIAL REQUESTS: NORMAL

## 2016-01-19 LAB — COMPREHENSIVE METABOLIC PANEL
ALBUMIN: 3.1 g/dL — AB (ref 3.5–5.0)
ALK PHOS: 46 U/L (ref 38–126)
ALT: 19 U/L (ref 14–54)
ANION GAP: 9 (ref 5–15)
AST: 46 U/L — ABNORMAL HIGH (ref 15–41)
BILIRUBIN TOTAL: 0.7 mg/dL (ref 0.3–1.2)
BUN: 11 mg/dL (ref 6–20)
CALCIUM: 7.8 mg/dL — AB (ref 8.9–10.3)
CO2: 22 mmol/L (ref 22–32)
CREATININE: 0.58 mg/dL (ref 0.44–1.00)
Chloride: 109 mmol/L (ref 101–111)
GFR calc non Af Amer: >60 mL/min (ref >60)
GLUCOSE: 84 mg/dL (ref 65–99)
Potassium: 3.1 mmol/L — ABNORMAL LOW (ref 3.5–5.1)
Sodium: 140 mmol/L (ref 135–145)
TOTAL PROTEIN: 5.7 g/dL — AB (ref 6.5–8.1)

## 2016-01-19 LAB — HIV ANTIBODY (ROUTINE TESTING W REFLEX): HIV Screen 4th Generation wRfx: NONREACTIVE

## 2016-01-19 LAB — EXPECTORATED SPUTUM ASSESSMENT W GRAM STAIN, RFLX TO RESP C

## 2016-01-19 NOTE — Care Management Important Message (Signed)
Important Message  Patient Details  Name: [name] MRN: [id number] Date of Birth: 1955/12/28   Medicare Important Message Given:  Yes    Sherald Barge, RN 01/19/2016, 4:34 PM

## 2016-01-19 NOTE — Progress Notes (Signed)
[name] [id number] DOB: [birth year] DOA: 01/18/2016 PCP: Joanie Coddington, MD  Brief narrative:  60 y/o ? 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-[smoker 2 packs per day till 2014]-on disability for this htn hld GERD foll at [facility] Hallux valgus s/p surgery R foot 05/2015  Started feeling poorly 01/17/16 in the midday had a loose stool felt dizzy and weak  Found to be hypotensive and had criterion for sepsis but resolved quickly and admitted to [facility]   Past medical history-As per Problem list Chart reviewed as below-   Consultants:    Procedures:    Antibiotics:  Azithro 3/2  Ceftriaxone 3/2   Subjective   Slightly better No fever overnight 4 loose stools No dysuria-urine caught in urinal no cp   Objective    Interim History:   Telemetry:    Objective: Filed Vitals:   01/18/16 1736 01/18/16 1927 01/18/16 2154 01/19/16 0550  BP: 98/55  108/56 112/60  Pulse: 88  87   Temp: 98.9 F (37.2 C)  98 F (36.7 C) 98.1 F (36.7 C)  TempSrc: Oral  Oral Oral  Resp: 20  15 16   Height: 5\' 4"  (1.626 m)     Weight: 54.159 kg (119 lb 6.4 oz)     SpO2: 92% 93% 90% 92%    Intake/Output Summary (Last 24 hours) at 01/19/16 1234 Last data filed at 01/19/16 0900  Gross per 24 hour  Intake    120 ml  Output      0 ml  Net    120 ml    Exam:  General: eomi ncat  Cardiovascular: s1 s2 no m/r/g Respiratory: clear no added sound Abdomen: soft nt nd  Skin no LE edema Neuro intact  Data Reviewed: Basic Metabolic Panel:  Recent Labs Lab 01/18/16 1200 01/19/16 0827  NA 130* 140  K 2.6* 3.1*  CL 91* 109  CO2 25 22  GLUCOSE 109* 84  BUN 22* 11  CREATININE 1.04* 0.58  CALCIUM 8.6* 7.8*   Liver Function Tests:  Recent Labs Lab 01/18/16 1200 01/19/16 0827  AST 54* 46*  ALT 23 19  ALKPHOS 63 46  BILITOT 0.8 0.7  PROT 7.4 5.7*  ALBUMIN 4.1 3.1*   No results for input(s): LIPASE, AMYLASE in the last 168 hours. No results for input(s): AMMONIA in the last 168  hours. CBC:  Recent Labs Lab 01/18/16 1200 01/19/16 0827  WBC 14.8* 7.4  NEUTROABS 12.8* 5.8  HGB 12.8 11.1*  HCT 37.9 32.8*  MCV 84.2 85.6  PLT 180 163   Cardiac Enzymes: No results for input(s): CKTOTAL, CKMB, CKMBINDEX, TROPONINI in the last 168 hours. BNP: Invalid input(s): POCBNP CBG: No results for input(s): GLUCAP in the last 168 hours.  Recent Results (from the past 240 hour(s))  Culture, blood (routine x 2)     Status: None (Preliminary result)   Collection Time: 01/18/16 12:04 PM  Result Value Ref Range Status   Specimen Description BLOOD RIGHT ANTECUBITAL  Final   Special Requests   Final    BOTTLES DRAWN AEROBIC AND ANAEROBIC AEB=12CC ANA=8CC   Culture NO GROWTH < 24 HOURS  Final   Report Status PENDING  Incomplete  Culture, blood (routine x 2)     Status: None (Preliminary result)   Collection Time: 01/18/16 12:08 PM  Result Value Ref Range Status   Specimen Description BLOOD RIGHT FOREARM  Final   Special Requests BOTTLES DRAWN AEROBIC ONLY 12CC  Final   Culture NO GROWTH < 24  HOURS  Final   Report Status PENDING  Incomplete  Urine culture     Status: None (Preliminary result)   Collection Time: 01/18/16  2:09 PM  Result Value Ref Range Status   Specimen Description URINE, CLEAN CATCH  Final   Special Requests NONE  Final   Culture   Final    TOO YOUNG TO READ Performed at [facility]    Report Status PENDING  Incomplete     Studies:              All Imaging reviewed and is as per above notation   Scheduled Meds: . azithromycin  500 mg Intravenous Q24H  . cefTRIAXone (ROCEPHIN)  IV  1 g Intravenous Q24H  . enoxaparin (LOVENOX) injection  30 mg Subcutaneous Q24H  . ferrous sulfate  325 mg Oral BID WC  . fluticasone furoate-vilanterol  1 puff Inhalation Daily  . gabapentin  300 mg Oral BID   And  . gabapentin  600 mg Oral QHS  . nortriptyline  25 mg Oral QHS   Continuous Infusions: . 0.9 % NaCl with KCl 20 mEq / L 125 mL/hr at  01/18/16 1900     Assessment/Plan:  Acute respiratory failure Moderate Sepsis secondary to Community acquired pneumonia-versus viral illness with diarrea Rule out CDiff Continue ceftriaxone and azithromycin, ruled out flu  CBC plus differential in a.m. Repeat 2 view x-ray 3/3 suggestive but not completely clear if PNA Follow blood culture, sputum culture Follow Legionella plus strep pneumo urinary antigen Continue saline with 20 mEq of K at 125 cc per hour for 24 more hours   Unlikely pyelonephritis await urine culture but would not Rx as such  Severe COPD-continue inhaled Xopenex by nebulization every 4 hourly, Spiriva 18 g, hold albuterol HFA for now, Continue Atrovent HFA 2 puffs every 4 when necessary as well as fluticasone inhalation 1 puff daily Continue chronic oxygen 2 L. Do not think she has an acute flare therefore no need for steroids at present time  Hypertension hold on amlodipine 5 for now   Hypovolemic hyponatremia -Secondary to sepsis. We'll continue replacement with IV fluids. THis is resolving well  Acute kidney injury Secondary to sepsis and poor perfusion Monitor labs in the morning  Depression continue Pamelor 25 daily  Neuropathy continue gabapentin 300-600 3 times a day  Deficiency anemia continue ferrous sulfate 325 twice a day  Hyperlipidemia hold statin for now   D/w patient alone Await resolution   Verneita Griffes, MD  [facility] Pager [phone] 01/19/2016, 12:34 PM    LOS: 1 day

## 2016-01-20 LAB — URINE CULTURE

## 2016-01-20 LAB — STREP PNEUMONIAE URINARY ANTIGEN: Strep Pneumo Urinary Antigen: NEGATIVE

## 2016-01-20 MED ORDER — ENOXAPARIN SODIUM 40 MG/0.4ML ~~LOC~~ SOLN
40.0000 mg | SUBCUTANEOUS | Status: DC
  Administered 2016-01-20 – 2016-01-22 (×3): 40 mg via SUBCUTANEOUS
  Filled 2016-01-20 (×3): qty 0.4

## 2016-01-20 MED ORDER — ACETAMINOPHEN 500 MG PO TABS
500.0000 mg | ORAL_TABLET | Freq: Four times a day (QID) | ORAL | Status: DC | PRN
Start: 2016-01-20 — End: 2016-01-23
  Administered 2016-01-20 – 2016-01-21 (×3): 500 mg via ORAL
  Filled 2016-01-20 (×3): qty 1

## 2016-01-20 NOTE — Progress Notes (Signed)
[name] [id number] DOB: 1956-08-22 DOA: 01/18/2016 PCP: Joanie Coddington, MD  Brief narrative:  60 y/o ? 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-[smoker 2 packs per day till 2014]-on disability for this htn hld GERD foll at [facility] Hallux valgus s/p surgery R foot 05/2015  Started feeling poorly 01/17/16 in the midday had a loose stool felt dizzy and weak  Found to be hypotensive and had criterion for sepsis but resolved quickly and admitted to [facility]   Past medical history-As per Problem list Chart reviewed as below-   Consultants:    Procedures:    Antibiotics:  Azithro 3/2  Ceftriaxone 3/2   Subjective   Not doing so well today Some HA and nausea No cp Cough ~ the same Same amount of sputum No diarr today   Objective    Interim History:   Telemetry:    Objective: Filed Vitals:   01/19/16 1700 01/19/16 2150 01/20/16 0555 01/20/16 0906  BP: 126/66 105/56 117/67   Pulse: 95 86 90   Temp: 98.2 F (36.8 C) 98.9 F (37.2 C) 100.1 F (37.8 C)   TempSrc: Oral Oral Oral   Resp: 16 20 20    Height:      Weight:      SpO2: 95% 95% 97% 91%    Intake/Output Summary (Last 24 hours) at 01/20/16 1310 Last data filed at 01/20/16 0825  Gross per 24 hour  Intake   2160 ml  Output    300 ml  Net   1860 ml    Exam:  General: eomi ncat  Cardiovascular: s1 s2 no m/r/g Respiratory: clear no added sound, no rales no rhonchi Abdomen: soft nt nd  Skin no LE edema Neuro intact  Data Reviewed: Basic Metabolic Panel:  Recent Labs Lab 01/18/16 1200 01/19/16 0827  NA 130* 140  K 2.6* 3.1*  CL 91* 109  CO2 25 22  GLUCOSE 109* 84  BUN 22* 11  CREATININE 1.04* 0.58  CALCIUM 8.6* 7.8*   Liver Function Tests:  Recent Labs Lab 01/18/16 1200 01/19/16 0827  AST 54* 46*  ALT 23 19  ALKPHOS 63 46  BILITOT 0.8 0.7  PROT 7.4 5.7*  ALBUMIN 4.1 3.1*   No results for input(s): LIPASE, AMYLASE in the last 168 hours. No results for input(s): AMMONIA in the last  168 hours. CBC:  Recent Labs Lab 01/18/16 1200 01/19/16 0827  WBC 14.8* 7.4  NEUTROABS 12.8* 5.8  HGB 12.8 11.1*  HCT 37.9 32.8*  MCV 84.2 85.6  PLT 180 163   Cardiac Enzymes: No results for input(s): CKTOTAL, CKMB, CKMBINDEX, TROPONINI in the last 168 hours. BNP: Invalid input(s): POCBNP CBG: No results for input(s): GLUCAP in the last 168 hours.  Recent Results (from the past 240 hour(s))  Culture, blood (routine x 2)     Status: None (Preliminary result)   Collection Time: 01/18/16 12:04 PM  Result Value Ref Range Status   Specimen Description BLOOD RIGHT ANTECUBITAL  Final   Special Requests   Final    BOTTLES DRAWN AEROBIC AND ANAEROBIC AEB=12CC ANA=8CC   Culture NO GROWTH 2 DAYS  Final   Report Status PENDING  Incomplete  Culture, blood (routine x 2)     Status: None (Preliminary result)   Collection Time: 01/18/16 12:08 PM  Result Value Ref Range Status   Specimen Description BLOOD RIGHT FOREARM  Final   Special Requests BOTTLES DRAWN AEROBIC ONLY 12CC  Final   Culture NO GROWTH 2 DAYS  Final  Report Status PENDING  Incomplete  Urine culture     Status: None   Collection Time: 01/18/16  2:09 PM  Result Value Ref Range Status   Specimen Description URINE, CLEAN CATCH  Final   Special Requests NONE  Final   Culture   Final    MULTIPLE SPECIES PRESENT, SUGGEST RECOLLECTION Performed at [facility]    Report Status 01/20/2016 FINAL  Final  Culture, sputum-assessment     Status: None   Collection Time: 01/19/16  2:40 PM  Result Value Ref Range Status   Specimen Description SPU  Final   Special Requests Normal  Final   Sputum evaluation THIS SPECIMEN IS ACCEPTABLE FOR SPUTUM CULTURE  Final   Report Status 01/19/2016 FINAL  Final     Studies:              All Imaging reviewed and is as per above notation   Scheduled Meds: . azithromycin  500 mg Intravenous Q24H  . cefTRIAXone (ROCEPHIN)  IV  1 g Intravenous Q24H  . enoxaparin (LOVENOX) injection   40 mg Subcutaneous Q24H  . ferrous sulfate  325 mg Oral BID WC  . fluticasone furoate-vilanterol  1 puff Inhalation Daily  . gabapentin  300 mg Oral BID   And  . gabapentin  600 mg Oral QHS  . nortriptyline  25 mg Oral QHS   Continuous Infusions: . 0.9 % NaCl with KCl 20 mEq / L 125 mL/hr at 01/20/16 [id number]     Assessment/Plan:  Acute respiratory failure Moderate Sepsis secondary to Community acquired pneumonia-versus viral illness with diarrea Rule out CDiff-as no stool d/c contact on 01/20/15 Continue ceftriaxone and azithromycin, ruled out flu  CBC plus differential in a.m. Repeat 2 view x-ray 3/3 suggestive but not completely clear if PNA Follow blood culture, sputum culture Follow Legionella pending strep pneumo urinary antigen neg Continue saline with 20 mEq of K at 125 cc per hour  Rpt labs in am  Unlikely pyelonephritis await urine culture but would not Rx as such  Severe COPD-continue inhaled Xopenex by nebulization every 4 hourly, Spiriva 18 g, hold albuterol HFA for now, Continue Atrovent HFA 2 puffs every 4 when necessary as well as fluticasone inhalation 1 puff daily Continue chronic oxygen 2 L. Do not think she has an acute flare therefore no need for steroids at present time  Hypertension hold on amlodipine 5 for now   Hypovolemic hyponatremia -Secondary to sepsis. We'll continue replacement with IV fluids. THis is resolving well  Acute kidney injury Secondary to sepsis and poor perfusion Resolving with repletion BUn/creat 22/1.04-->11/0.58  Depression continue Pamelor 25 daily  Neuropathy continue gabapentin 300-600 3 times a day  Deficiency anemia continue ferrous sulfate 325 twice a day  Hyperlipidemia hold statin for now   D/w patient alone Await resolution  Likely will need another 2-3 days   Verneita Griffes, MD  [facility] Pager [phone] 01/20/2016, 1:10 PM    LOS: 2 days

## 2016-01-21 LAB — CBC
HEMATOCRIT: 31.3 % — AB (ref 36.0–46.0)
Hemoglobin: 10.5 g/dL — ABNORMAL LOW (ref 12.0–15.0)
MCH: 28.9 pg (ref 26.0–34.0)
MCHC: 33.5 g/dL (ref 30.0–36.0)
MCV: 86.2 fL (ref 78.0–100.0)
PLATELETS: 175 10*3/uL (ref 150–400)
RBC: 3.63 MIL/uL — ABNORMAL LOW (ref 3.87–5.11)
RDW: 13.5 % (ref 11.5–15.5)
WBC: 1.8 10*3/uL — ABNORMAL LOW (ref 4.0–10.5)

## 2016-01-21 LAB — BASIC METABOLIC PANEL
Anion gap: 7 (ref 5–15)
BUN: 3 mg/dL — AB (ref 6–20)
CHLORIDE: 104 mmol/L (ref 101–111)
CO2: 30 mmol/L (ref 22–32)
CREATININE: 0.41 mg/dL — AB (ref 0.44–1.00)
Calcium: 7.7 mg/dL — ABNORMAL LOW (ref 8.9–10.3)
GFR calc Af Amer: >60 mL/min (ref >60)
GFR calc non Af Amer: >60 mL/min (ref >60)
GLUCOSE: 89 mg/dL (ref 65–99)
POTASSIUM: 3.2 mmol/L — AB (ref 3.5–5.1)
SODIUM: 141 mmol/L (ref 135–145)

## 2016-01-21 LAB — C DIFFICILE QUICK SCREEN W PCR REFLEX
C DIFFICLE (CDIFF) ANTIGEN: NEGATIVE
C Diff interpretation: NEGATIVE
C Diff toxin: NEGATIVE

## 2016-01-21 MED ORDER — GABAPENTIN 300 MG PO CAPS
300.0000 mg | ORAL_CAPSULE | ORAL | Status: DC
  Administered 2016-01-22 – 2016-01-23 (×2): 300 mg via ORAL
  Filled 2016-01-21 (×2): qty 1

## 2016-01-21 MED ORDER — TIOTROPIUM BROMIDE MONOHYDRATE 18 MCG IN CAPS
18.0000 ug | ORAL_CAPSULE | Freq: Every day | RESPIRATORY_TRACT | Status: DC
  Administered 2016-01-21 – 2016-01-23 (×3): 18 ug via RESPIRATORY_TRACT
  Filled 2016-01-21: qty 5

## 2016-01-21 MED ORDER — GABAPENTIN 300 MG PO CAPS
600.0000 mg | ORAL_CAPSULE | Freq: Every day | ORAL | Status: DC
  Administered 2016-01-21 – 2016-01-22 (×2): 600 mg via ORAL
  Filled 2016-01-21: qty 2

## 2016-01-21 MED ORDER — GABAPENTIN 300 MG PO CAPS: 300.0000 mg | ORAL_CAPSULE | Freq: Three times a day (TID) | ORAL | Status: DC

## 2016-01-21 NOTE — Progress Notes (Signed)
[name] [id number] DOB: September 06, 1956 DOA: 01/18/2016 PCP: Joanie Coddington, MD  Brief narrative:  60 y/o ? 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-[smoker 2 packs per day till 2014]-on disability for this htn hld GERD foll at [facility] Hallux valgus s/p surgery R foot 05/2015  Started feeling poorly 01/17/16 in the midday had a loose stool felt dizzy and weak  Found to be hypotensive and had criterion for sepsis but resolved quickly and admitted to [facility]   Past medical history-As per Problem list Chart reviewed as below-   Consultants:    Procedures:    Antibiotics:  Azithro 3/2  Ceftriaxone 3/2   Subjective   continued Headache but fair No cp no SOB out of ordinary No diarr nor fever Eating ok   Objective    Interim History:   Telemetry:    Objective: Filed Vitals:   01/20/16 1411 01/20/16 2157 01/21/16 0559 01/21/16 0844  BP: 110/60 92/54 120/61   Pulse: 89 74 83   Temp: 99.4 F (37.4 C) 98.6 F (37 C) 98.2 F (36.8 C)   TempSrc: Oral Oral Oral   Resp: 20 20 20    Height:      Weight:      SpO2: 94% 96% 91% 92%    Intake/Output Summary (Last 24 hours) at 01/21/16 1107 Last data filed at 01/20/16 1700  Gross per 24 hour  Intake    360 ml  Output    300 ml  Net     60 ml    Exam:  General: eomi ncat  Cardiovascular: s1 s2 no m/r/g Respiratory: clear no added sound, no rales no rhonchi Abdomen: soft nt nd  Skin no LE edema Neuro intact  Data Reviewed: Basic Metabolic Panel:  Recent Labs Lab 01/18/16 1200 01/19/16 0827 01/21/16 0601  NA 130* 140 141  K 2.6* 3.1* 3.2*  CL 91* 109 104  CO2 25 22 30   GLUCOSE 109* 84 89  BUN 22* 11 3*  CREATININE 1.04* 0.58 0.41*  CALCIUM 8.6* 7.8* 7.7*   Liver Function Tests:  Recent Labs Lab 01/18/16 1200 01/19/16 0827  AST 54* 46*  ALT 23 19  ALKPHOS 63 46  BILITOT 0.8 0.7  PROT 7.4 5.7*  ALBUMIN 4.1 3.1*   No results for input(s): LIPASE, AMYLASE in the last 168 hours. No results for  input(s): AMMONIA in the last 168 hours. CBC:  Recent Labs Lab 01/18/16 1200 01/19/16 0827 01/21/16 0601  WBC 14.8* 7.4 1.8*  NEUTROABS 12.8* 5.8  --   HGB 12.8 11.1* 10.5*  HCT 37.9 32.8* 31.3*  MCV 84.2 85.6 86.2  PLT 180 163 175   Cardiac Enzymes: No results for input(s): CKTOTAL, CKMB, CKMBINDEX, TROPONINI in the last 168 hours. BNP: Invalid input(s): POCBNP CBG: No results for input(s): GLUCAP in the last 168 hours.  Recent Results (from the past 240 hour(s))  Culture, blood (routine x 2)     Status: None (Preliminary result)   Collection Time: 01/18/16 12:04 PM  Result Value Ref Range Status   Specimen Description BLOOD RIGHT ANTECUBITAL  Final   Special Requests   Final    BOTTLES DRAWN AEROBIC AND ANAEROBIC AEB=12CC ANA=8CC   Culture NO GROWTH 2 DAYS  Final   Report Status PENDING  Incomplete  Culture, blood (routine x 2)     Status: None (Preliminary result)   Collection Time: 01/18/16 12:08 PM  Result Value Ref Range Status   Specimen Description BLOOD RIGHT FOREARM  Final  Special Requests BOTTLES DRAWN AEROBIC ONLY 12CC  Final   Culture NO GROWTH 2 DAYS  Final   Report Status PENDING  Incomplete  Urine culture     Status: None   Collection Time: 01/18/16  2:09 PM  Result Value Ref Range Status   Specimen Description URINE, CLEAN CATCH  Final   Special Requests NONE  Final   Culture   Final    MULTIPLE SPECIES PRESENT, SUGGEST RECOLLECTION Performed at [facility]    Report Status 01/20/2016 FINAL  Final  Culture, sputum-assessment     Status: None   Collection Time: 01/19/16  2:40 PM  Result Value Ref Range Status   Specimen Description SPU  Final   Special Requests Normal  Final   Sputum evaluation THIS SPECIMEN IS ACCEPTABLE FOR SPUTUM CULTURE  Final   Report Status 01/19/2016 FINAL  Final  Culture, respiratory (NON-Expectorated)     Status: None (Preliminary result)   Collection Time: 01/19/16  2:40 PM  Result Value Ref Range Status    Specimen Description SPUTUM  Final   Special Requests NONE  Final   Gram Stain   Final    ABUNDANT WBC PRESENT, PREDOMINANTLY PMN RARE SQUAMOUS EPITHELIAL CELLS PRESENT MODERATE YEAST FEW GRAM POSITIVE COCCI IN CLUSTERS Performed at [facility]    Culture   Final    MODERATE YEAST Performed at [facility]    Report Status PENDING  Incomplete     Studies:              All Imaging reviewed and is as per above notation   Scheduled Meds: . azithromycin  500 mg Intravenous Q24H  . cefTRIAXone (ROCEPHIN)  IV  1 g Intravenous Q24H  . enoxaparin (LOVENOX) injection  40 mg Subcutaneous Q24H  . ferrous sulfate  325 mg Oral BID WC  . fluticasone furoate-vilanterol  1 puff Inhalation Daily  . gabapentin  300 mg Oral BID   And  . gabapentin  600 mg Oral QHS  . gabapentin  300 mg Oral 2 times per day   And  . gabapentin  600 mg Oral QHS  . nortriptyline  25 mg Oral QHS  . tiotropium  18 mcg Inhalation Daily   Continuous Infusions: . 0.9 % NaCl with KCl 20 mEq / L 125 mL/hr at 01/21/16 [id number]     Assessment/Plan:  Acute respiratory failure Moderate Sepsis secondary to Community acquired pneumonia-versus viral illness with diarrea Rule out CDiff-as no stool d/c contact on 01/20/15 Continue ceftriaxone and azithromycin, ruled out flu  CBC plus differential in a.m. Repeat 2 view x-ray 3/3 suggestive but not completely clear if PNA Follow blood culture, sputum culture Follow Legionella pending strep pneumo urinary antigen neg Continue saline with 20 mEq of K at 125 cc per hour  Rpt labs in am  Unlikely pyelonephritis await urine culture but would not Rx as such  Leukopenia-WBC 1.8.  Likely viral component?  monitor Rpt cbc in am  Headache-continue tylenol. No concerning issues for iC pathology.  Monitor on tylenol  Severe COPD-continue inhaled Xopenex by nebulization every 4 hourly, Spiriva 18 g, hold albuterol HFA for now, Continue Atrovent HFA 2 puffs every 4  when necessary as well as fluticasone inhalation 1 puff daily Continue chronic oxygen 2 L. Do not think she has an acute flare therefore no need for steroids at present time  Hypertension hold on amlodipine 5 for now   Hypovolemic hyponatremia -Secondary to sepsis. We'll continue replacement  with IV fluids. THis is resolving well  Hypokalemia replace if below 3.0 in am Cut back saline to 50 cc/hr with 20 K  Acute kidney injury Secondary to sepsis and poor perfusion Resolving with repletion BUn/creat 22/1.04-->11/0.58-->3/0.41  Depression continue Pamelor 25 daily  Neuropathy continue gabapentin 300-600 3 times a day  Deficiency anemia continue ferrous sulfate 325 twice a day  Hyperlipidemia hold statin for now   D/w patient alone Await resolution  Likely will need another 2-3 days   Verneita Griffes, MD  [facility] Pager [phone] 01/21/2016, 11:07 AM    LOS: 3 days

## 2016-01-22 LAB — COMPREHENSIVE METABOLIC PANEL
ALK PHOS: 44 U/L (ref 38–126)
ALT: 14 U/L (ref 14–54)
ANION GAP: 7 (ref 5–15)
AST: 23 U/L (ref 15–41)
Albumin: 2.9 g/dL — ABNORMAL LOW (ref 3.5–5.0)
BILIRUBIN TOTAL: 0.2 mg/dL — AB (ref 0.3–1.2)
BUN: <5 mg/dL — ABNORMAL LOW (ref 6–20)
CALCIUM: 8 mg/dL — AB (ref 8.9–10.3)
CO2: 31 mmol/L (ref 22–32)
CREATININE: 0.4 mg/dL — AB (ref 0.44–1.00)
Chloride: 103 mmol/L (ref 101–111)
Glucose, Bld: 97 mg/dL (ref 65–99)
Potassium: 3.2 mmol/L — ABNORMAL LOW (ref 3.5–5.1)
Sodium: 141 mmol/L (ref 135–145)
TOTAL PROTEIN: 5.4 g/dL — AB (ref 6.5–8.1)

## 2016-01-22 LAB — CBC WITH DIFFERENTIAL/PLATELET
BASOS ABS: 0 10*3/uL (ref 0.0–0.1)
BASOS PCT: 1 %
EOS ABS: 0 10*3/uL (ref 0.0–0.7)
Eosinophils Relative: 1 %
HCT: 31.6 % — ABNORMAL LOW (ref 36.0–46.0)
HEMOGLOBIN: 10.3 g/dL — AB (ref 12.0–15.0)
Lymphocytes Relative: 54 %
Lymphs Abs: 1.3 10*3/uL (ref 0.7–4.0)
MCH: 28.3 pg (ref 26.0–34.0)
MCHC: 32.6 g/dL (ref 30.0–36.0)
MCV: 86.8 fL (ref 78.0–100.0)
MONOS PCT: 16 %
Monocytes Absolute: 0.4 10*3/uL (ref 0.1–1.0)
NEUTROS ABS: 0.7 10*3/uL — AB (ref 1.7–7.7)
NEUTROS PCT: 28 %
Platelets: 191 10*3/uL (ref 150–400)
RBC: 3.64 MIL/uL — ABNORMAL LOW (ref 3.87–5.11)
RDW: 13.5 % (ref 11.5–15.5)
WBC: 2.4 10*3/uL — AB (ref 4.0–10.5)

## 2016-01-22 LAB — CULTURE, RESPIRATORY W GRAM STAIN

## 2016-01-22 LAB — CULTURE, RESPIRATORY

## 2016-01-22 LAB — LEGIONELLA ANTIGEN, URINE

## 2016-01-22 MED ORDER — POTASSIUM CHLORIDE CRYS ER 20 MEQ PO TBCR
40.0000 meq | EXTENDED_RELEASE_TABLET | Freq: Every day | ORAL | Status: DC
  Administered 2016-01-22 – 2016-01-23 (×2): 40 meq via ORAL
  Filled 2016-01-22 (×2): qty 2

## 2016-01-22 MED ORDER — DOXYCYCLINE HYCLATE 100 MG PO TABS
100.0000 mg | ORAL_TABLET | Freq: Two times a day (BID) | ORAL | Status: DC
  Administered 2016-01-22 – 2016-01-23 (×3): 100 mg via ORAL
  Filled 2016-01-22 (×3): qty 1

## 2016-01-22 MED ORDER — POTASSIUM CHLORIDE CRYS ER 20 MEQ PO TBCR: 20.0000 meq | EXTENDED_RELEASE_TABLET | Freq: Every day | ORAL | Status: DC

## 2016-01-22 NOTE — Progress Notes (Signed)
[name] [id number] DOB: [birth year] DOA: 01/18/2016 PCP: Joanie Coddington, MD  Brief narrative:  60 y/o ? 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-[smoker 2 packs per day till 2014]-on disability for this htn hld GERD foll at [facility] Hallux valgus s/p surgery R foot 05/2015  Started feeling poorly 01/17/16 in the midday had a loose stool felt dizzy and weak  Found to be hypotensive and had criterion for sepsis but resolved quickly and admitted to [facility]   Past medical history-As per Problem list Chart reviewed as below-   Consultants:    Procedures:    Antibiotics:  Azithro 3/2  Ceftriaxone 3/2   Subjective   Headache is much improved Thinks that it was secondary to caffeine use Tolerating diet and looks much better Cough is a little better    Objective    Interim History:   Telemetry:    Objective: Filed Vitals:   01/21/16 1401 01/21/16 2230 01/22/16 0606 01/22/16 0833  BP: 128/67 122/69 100/64   Pulse: 92 80 77   Temp: 97.8 F (36.6 C) 98.1 F (36.7 C) 98.1 F (36.7 C)   TempSrc: Oral Oral Oral   Resp: 20 20 20    Height:      Weight:      SpO2: 95% 94% 94% 96%   No intake or output data in the 24 hours ending 01/22/16 0917  Exam:  General: eomi ncat  Cardiovascular: s1 s2 no m/r/g Respiratory: clear no added sound, no rales no rhonchi Abdomen: soft nt nd  Skin no LE edema Neuro intact  Data Reviewed: Basic Metabolic Panel:  Recent Labs Lab 01/18/16 1200 01/19/16 0827 01/21/16 0601 01/22/16 0618  NA 130* 140 141 141  K 2.6* 3.1* 3.2* 3.2*  CL 91* 109 104 103  CO2 25 22 30 31   GLUCOSE 109* 84 89 97  BUN 22* 11 3* <5*  CREATININE 1.04* 0.58 0.41* 0.40*  CALCIUM 8.6* 7.8* 7.7* 8.0*   Liver Function Tests:  Recent Labs Lab 01/18/16 1200 01/19/16 0827 01/22/16 0618  AST 54* 46* 23  ALT 23 19 14   ALKPHOS 63 46 44  BILITOT 0.8 0.7 0.2*  PROT 7.4 5.7* 5.4*  ALBUMIN 4.1 3.1* 2.9*   No results for input(s): LIPASE, AMYLASE in the last  168 hours. No results for input(s): AMMONIA in the last 168 hours. CBC:  Recent Labs Lab 01/18/16 1200 01/19/16 0827 01/21/16 0601 01/22/16 0618  WBC 14.8* 7.4 1.8* 2.4*  NEUTROABS 12.8* 5.8  --  0.7*  HGB 12.8 11.1* 10.5* 10.3*  HCT 37.9 32.8* 31.3* 31.6*  MCV 84.2 85.6 86.2 86.8  PLT 180 163 175 191   Cardiac Enzymes: No results for input(s): CKTOTAL, CKMB, CKMBINDEX, TROPONINI in the last 168 hours. BNP: Invalid input(s): POCBNP CBG: No results for input(s): GLUCAP in the last 168 hours.  Recent Results (from the past 240 hour(s))  Culture, blood (routine x 2)     Status: None (Preliminary result)   Collection Time: 01/18/16 12:04 PM  Result Value Ref Range Status   Specimen Description BLOOD RIGHT ANTECUBITAL  Final   Special Requests   Final    BOTTLES DRAWN AEROBIC AND ANAEROBIC AEB=12CC ANA=8CC   Culture NO GROWTH 2 DAYS  Final   Report Status PENDING  Incomplete  Culture, blood (routine x 2)     Status: None (Preliminary result)   Collection Time: 01/18/16 12:08 PM  Result Value Ref Range Status   Specimen Description BLOOD RIGHT FOREARM  Final  Special Requests BOTTLES DRAWN AEROBIC ONLY 12CC  Final   Culture NO GROWTH 2 DAYS  Final   Report Status PENDING  Incomplete  Urine culture     Status: None   Collection Time: 01/18/16  2:09 PM  Result Value Ref Range Status   Specimen Description URINE, CLEAN CATCH  Final   Special Requests NONE  Final   Culture   Final    MULTIPLE SPECIES PRESENT, SUGGEST RECOLLECTION Performed at [facility]    Report Status 01/20/2016 FINAL  Final  Culture, sputum-assessment     Status: None   Collection Time: 01/19/16  2:40 PM  Result Value Ref Range Status   Specimen Description SPU  Final   Special Requests Normal  Final   Sputum evaluation THIS SPECIMEN IS ACCEPTABLE FOR SPUTUM CULTURE  Final   Report Status 01/19/2016 FINAL  Final  Culture, respiratory (NON-Expectorated)     Status: None (Preliminary result)     Collection Time: 01/19/16  2:40 PM  Result Value Ref Range Status   Specimen Description SPUTUM  Final   Special Requests NONE  Final   Gram Stain   Final    ABUNDANT WBC PRESENT, PREDOMINANTLY PMN RARE SQUAMOUS EPITHELIAL CELLS PRESENT MODERATE YEAST FEW GRAM POSITIVE COCCI IN CLUSTERS Performed at [facility]    Culture   Final    MODERATE YEAST Performed at [facility]    Report Status PENDING  Incomplete  C difficile quick scan w PCR reflex     Status: None   Collection Time: 01/21/16  1:28 PM  Result Value Ref Range Status   C Diff antigen NEGATIVE NEGATIVE Final   C Diff toxin NEGATIVE NEGATIVE Final   C Diff interpretation Negative for toxigenic C. difficile  Final     Studies:              All Imaging reviewed and is as per above notation   Scheduled Meds: . doxycycline  100 mg Oral Q12H  . enoxaparin (LOVENOX) injection  40 mg Subcutaneous Q24H  . ferrous sulfate  325 mg Oral BID WC  . fluticasone furoate-vilanterol  1 puff Inhalation Daily  . gabapentin  300 mg Oral BID   And  . gabapentin  600 mg Oral QHS  . gabapentin  300 mg Oral 2 times per day   And  . gabapentin  600 mg Oral QHS  . nortriptyline  25 mg Oral QHS  . potassium chloride  40 mEq Oral Daily  . tiotropium  18 mcg Inhalation Daily   Continuous Infusions:     Assessment/Plan:  Acute respiratory failure Moderate Sepsis secondary to Community acquired pneumonia-versus viral illness with diarrea Rule out CDiff-as no stool d/c contact on 01/20/15  ceftriaxone and azithromycint flu-transition antibiotics to by mouth doxycycline 100 twice a day 3/6 Repeat 2 view x-ray 3/3 suggestive but not completely clear if PNA Blood culture no growth 2 days, sputum cultures pending  strep pneumo urinary antigen neg Legionella scope and Continue saline with 20 mEq of K at 125 cc per hour  Rpt labs in am  Unlikely pyelonephritis await urine culture but would not Rx as  such  Leukopenia-WBC 1.8-->2.4.  Likely viral component?  monitor  Headache-continue tylenol. No concerning issues for iC pathology.  Monitor on tylenol  Severe COPD-continue inhaled Xopenex by nebulization every 4 hourly, Spiriva 18 g, hold albuterol HFA for now, Continue Atrovent HFA 2 puffs every 4 when necessary as well as fluticasone  inhalation 1 puff daily Continue chronic oxygen 2 L. Do not think she has an acute flare therefore no need for steroids at present time  Hypertension hold on amlodipine 5 for now   Hypovolemic hyponatremia -Secondary to sepsis. We'll continue replacement with IV fluids. THis is resolving well  Hypokalemia Transition to by mouth replacement Discontinue IV saline completely 3/6  Acute kidney injury Secondary to sepsis and poor perfusion Resolving with repletion BUn/creat 22/1.04-->11/0.58-->3/0.41-->-/0.4 Discontinued IV fluids on 3/6  Depression continue Pamelor 25 daily  Neuropathy continue gabapentin 300-600 3 times a day  Deficiency anemia continue ferrous sulfate 325 twice a day  Hyperlipidemia hold statin for now   D/w patient alone Await resolution  Likely will need another 24 hours and likely can discharge home on 3/7   Verneita Griffes, MD  [facility] Pager [phone] 01/22/2016, 9:17 AM    LOS: 4 days

## 2016-01-23 LAB — CULTURE, BLOOD (ROUTINE X 2)
CULTURE: NO GROWTH
Culture: NO GROWTH

## 2016-01-23 MED ORDER — POTASSIUM CHLORIDE CRYS ER 20 MEQ PO TBCR: 40.0000 meq | EXTENDED_RELEASE_TABLET | Freq: Every day | ORAL | Status: DC

## 2016-01-23 MED ORDER — DOXYCYCLINE HYCLATE 100 MG PO TABS: 100.0000 mg | ORAL_TABLET | Freq: Two times a day (BID) | ORAL | Status: DC

## 2016-01-23 NOTE — Discharge Summary (Signed)
Physician Discharge Summary  [name] [id number] DOB: August 25, 1956 DOA: 01/18/2016  PCP: Joanie Coddington, MD  Admit date: 01/18/2016 Discharge date: 01/23/2016  Time spent: 35 minutes  Recommendations for Outpatient Follow-up:  1. Complete 3 more days of doxycycline 2. Get blood work done as an outpatient is hypokalemic this admission, also follow CBC is slightly leukopenic this admission which probably secondary viral component of disease  Discharge Diagnoses:  Active Problems:   CAP (community acquired pneumonia)   Discharge Condition: Improved  Diet recommendation: Heart healthy low-salt F2  Filed Weights   01/18/16 1736  Weight: 54.159 kg (119 lb 6.4 oz)    History of present illness:  60 y/o ? 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-[smoker 2 packs per day till 2014]-on disability for this htn hld GERD foll at [facility] Hallux valgus s/p surgery R foot 05/2015  Started feeling poorly 01/17/16 in the midday had a loose stool felt dizzy and weak  Found to be hypotensive and had criterion for sepsis but resolved quickly and admitted to [facility] Course:  Acute respiratory failure Moderate Sepsis secondary to Community acquired pneumonia-versus viral illness with diarrea Rule out CDiff-as no stool d/c contact on 01/20/15 ceftriaxone and azithromycint flu-transition antibiotics to by mouth doxycycline 100 twice a day 3/6 Repeat 2 view x-ray 3/3 suggestive but not completely clear if PNA Blood culture no growth 2 days, sputum cultures pending strep pneumo urinary antigen neg Legionella not done  Unlikely pyelonephritis await urine culture but would not Rx as such  Leukopenia-WBC 1.8-->2.4.She's had no fever or chills and would benefit from repeat CBC as an outpatient  Headache-continue tylenol. No concerning issues for iC pathology. Monitor on tylenol  Severe COPD-continue inhaled Xopenex by nebulization every 4 hourly, Spiriva 18 g, hold albuterol HFA for now, Continue  Atrovent HFA 2 puffs every 4 when necessary as well as fluticasone inhalation 1 puff daily Continue chronic oxygen 2 L. continue Breo Patient resolved well  Hypertension hold on amlodipine 5 for now  This was resumed on discharge as her hypotension had resolved  Hypovolemic hyponatremia -Secondary to sepsis. We'll continue replacement with IV fluids. THis is resolving well  Hypokalemia Transition to by mouth replacement Discontinue IV saline completely 3/6  Acute kidney injury Secondary to sepsis and poor perfusion Resolving with repletion BUn/creat 22/1.04-->11/0.58-->3/0.41-->-/0.4 Discontinued IV fluids on 3/6  Depression continue Pamelor 25 daily  Neuropathy continue gabapentin 300-600 3 times a day  Deficiency anemia continue ferrous sulfate 325 twice a day  Hyperlipidemia hold statin for now, resumed on discharge  Discharge Exam: Filed Vitals:   01/22/16 2053 01/23/16 0601  BP: 103/61 123/73  Pulse: 78 79  Temp: 97.8 F (36.6 C) 97.8 F (36.6 C)  Resp: 18 19   Alert pleasant oriented no apparent distress General: EOMI NCAT Cardiovascular: S1-S2 no murmur rub or gallop Respiratory: Clear with mild wheeze  Discharge Instructions   Discharge Instructions    Diet - low sodium heart healthy    Complete by:  As directed      Discharge instructions    Complete by:  As directed   Complete 3 more days of antibiotics which is 6 more doses Follow-up with her primary physician in about one week and get lab work and Continue oxygen as usual He will need potassium replacement as an outpatient so get labs rechecked in the computer potassium as well as her prescriptions at [facility]     Increase activity slowly    Complete by:  As  directed           Current Discharge Medication List    START taking these medications   Details  doxycycline (VIBRA-TABS) 100 MG tablet Take 1 tablet (100 mg total) by mouth every 12 (twelve) hours. Qty: 6 tablet, Refills: 0     potassium chloride SA (K-DUR,KLOR-CON) 20 MEQ tablet Take 2 tablets (40 mEq total) by mouth daily. Qty: 60 tablet, Refills: 0      CONTINUE these medications which have NOT CHANGED   Details  albuterol (PROVENTIL HFA;VENTOLIN HFA) 108 (90 BASE) MCG/ACT inhaler Inhale 4-6 puffs into the lungs every 6 (six) hours as needed for wheezing or shortness of breath.    amLODipine (NORVASC) 5 MG tablet Take 5 mg by mouth at bedtime.    ferrous sulfate 325 (65 FE) MG tablet Take 325 mg by mouth 2 (two) times daily with a meal.     fluticasone furoate-vilanterol (BREO ELLIPTA) 100-25 MCG/INH AEPB Inhale 1 puff into the lungs daily.    gabapentin (NEURONTIN) 300 MG capsule Take 300-600 mg by mouth 3 (three) times daily. 1cap in the am and 1 cap in the afternoon and 2cap at bedtime    ibuprofen (ADVIL,MOTRIN) 400 MG tablet Take 400 mg by mouth every 6 (six) hours as needed for moderate pain.    ipratropium (ATROVENT HFA) 17 MCG/ACT inhaler Inhale 2 puffs into the lungs every 4 (four) hours as needed for wheezing.    levalbuterol (XOPENEX) 1.25 MG/3ML nebulizer solution Take 1.25 mg by nebulization every 6 (six) hours as needed for wheezing or shortness of breath.     nortriptyline (PAMELOR) 25 MG capsule Take 25 mg by mouth at bedtime.    simvastatin (ZOCOR) 40 MG tablet Take 40 mg by mouth at bedtime.    tiotropium (SPIRIVA) 18 MCG inhalation capsule Place 18 mcg into inhaler and inhale daily.    guaiFENesin (MUCINEX) 600 MG 12 hr tablet Take 1 tablet (600 mg total) by mouth 2 (two) times daily. Qty: 10 tablet, Refills: 0    ondansetron (ZOFRAN) 8 MG tablet Take 1 tablet (8 mg total) by mouth every 8 (eight) hours as needed for nausea or vomiting. Qty: 10 tablet, Refills: 0    OXYGEN Inhale 2 L into the lungs continuous.      STOP taking these medications     predniSONE (DELTASONE) 20 MG tablet        No Known Allergies    The results of significant diagnostics from this  hospitalization (including imaging, microbiology, ancillary and laboratory) are listed below for reference.    Significant Diagnostic Studies: Dg Chest 2 View  01/19/2016  CLINICAL DATA:  Community-acquired pneumonia EXAM: CHEST  2 VIEW COMPARISON:  01/18/2016 FINDINGS: Mild patchy left basilar opacity, atelectasis versus pneumonia. No pleural effusion or pneumothorax. The heart is normal in size. Visualized osseous structures are within normal limits. IMPRESSION: Mild patchy left basilar opacity, atelectasis versus pneumonia. Electronically Signed   By: Julian Hy M.D.   On: 01/19/2016 10:45   Ct Head Wo Contrast  01/18/2016  CLINICAL DATA:  Confusion/altered mental status following fall 1 day prior EXAM: CT HEAD WITHOUT CONTRAST TECHNIQUE: Contiguous axial images were obtained from the base of the skull through the vertex without intravenous contrast. COMPARISON:  October 09, 2013 FINDINGS: The ventricles are normal in size and configuration. There is no intracranial mass, hemorrhage, extra-axial fluid collection, or midline shift. Gray-white compartments are normal. No acute infarct evident. Knee calvarium appears intact. Mastoids on  the left are clear. There is opacification of multiple inferior mastoid air cells on the right, stable. The visualized orbits appear unremarkable. IMPRESSION: Chronic opacification of several inferior mastoid air cells on the right. Mastoids on the left are clear. No intracranial mass, hemorrhage, or extra-axial fluid collection. Gray-white compartments appear normal. Electronically Signed   By: Lowella Grip III M.D.   On: 01/18/2016 13:38   Dg Chest Port 1 View  01/18/2016  CLINICAL DATA:  Cough with shortness of breath and fever for 2 days EXAM: PORTABLE CHEST 1 VIEW COMPARISON:  Chest radiograph November 05, 2015 and chest CT November 28, 2015 FINDINGS: There is diffuse interstitial thickening in the floor lung zones bilaterally. There is no frank airspace  consolidation. The heart size and pulmonary vascularity are normal. There are patchy areas of lower lobe bronchiectatic change. No adenopathy apparent. No bone lesions. IMPRESSION: Bibasilar interstitial thickening. Suspect infectious interstitial pneumonia in the bases, although a somewhat atypical appearance of pulmonary edema could present in this manner. Followup PA and lateral chest radiographs recommended in 3-4 weeks following trial of antibiotic therapy to ensure resolution and exclude underlying malignancy. Electronically Signed   By: Lowella Grip III M.D.   On: 01/18/2016 12:30   Dg Ugi W/o Kub  01/09/2016  CLINICAL DATA:  Severe reflux. EXAM: UPPER GI SERIES WITHOUT KUB TECHNIQUE: Routine upper GI series was performed with high density and thin barium. FLUOROSCOPY TIME:  Radiation Exposure Index (as provided by the fluoroscopic device): 18.9 mGy COMPARISON:  CT 11/28/2015. FINDINGS: Small hiatal hernia noted. No significant reflux. Standardized barium tablet passed normally. Esophagus is otherwise unremarkable. Stomach and duodenum are normal. IMPRESSION: Small hiatal hernia. No significant reflux. Exam otherwise unremarkable. Electronically Signed   By: Marcello Moores  Register   On: 01/09/2016 09:39    Microbiology: Recent Results (from the past 240 hour(s))  Culture, blood (routine x 2)     Status: None (Preliminary result)   Collection Time: 01/18/16 12:04 PM  Result Value Ref Range Status   Specimen Description BLOOD RIGHT ANTECUBITAL  Final   Special Requests   Final    BOTTLES DRAWN AEROBIC AND ANAEROBIC AEB=12CC ANA=8CC   Culture NO GROWTH 4 DAYS  Final   Report Status PENDING  Incomplete  Culture, blood (routine x 2)     Status: None (Preliminary result)   Collection Time: 01/18/16 12:08 PM  Result Value Ref Range Status   Specimen Description BLOOD RIGHT FOREARM  Final   Special Requests BOTTLES DRAWN AEROBIC ONLY 12CC  Final   Culture NO GROWTH 4 DAYS  Final   Report Status  PENDING  Incomplete  Urine culture     Status: None   Collection Time: 01/18/16  2:09 PM  Result Value Ref Range Status   Specimen Description URINE, CLEAN CATCH  Final   Special Requests NONE  Final   Culture   Final    MULTIPLE SPECIES PRESENT, SUGGEST RECOLLECTION Performed at [facility]    Report Status 01/20/2016 FINAL  Final  Culture, sputum-assessment     Status: None   Collection Time: 01/19/16  2:40 PM  Result Value Ref Range Status   Specimen Description SPU  Final   Special Requests Normal  Final   Sputum evaluation THIS SPECIMEN IS ACCEPTABLE FOR SPUTUM CULTURE  Final   Report Status 01/19/2016 FINAL  Final  Culture, respiratory (NON-Expectorated)     Status: None   Collection Time: 01/19/16  2:40 PM  Result Value Ref Range Status  Specimen Description SPUTUM  Final   Special Requests NONE  Final   Gram Stain   Final    ABUNDANT WBC PRESENT, PREDOMINANTLY PMN RARE SQUAMOUS EPITHELIAL CELLS PRESENT MODERATE YEAST FEW GRAM POSITIVE COCCI IN CLUSTERS Performed at [facility]    Culture   Final    MODERATE YEAST Performed at [facility]    Report Status 01/22/2016 FINAL  Final  C difficile quick scan w PCR reflex     Status: None   Collection Time: 01/21/16  1:28 PM  Result Value Ref Range Status   C Diff antigen NEGATIVE NEGATIVE Final   C Diff toxin NEGATIVE NEGATIVE Final   C Diff interpretation Negative for toxigenic C. difficile  Final     Labs: Basic Metabolic Panel:  Recent Labs Lab 01/18/16 1200 01/19/16 0827 01/21/16 0601 01/22/16 0618  NA 130* 140 141 141  K 2.6* 3.1* 3.2* 3.2*  CL 91* 109 104 103  CO2 25 22 30 31   GLUCOSE 109* 84 89 97  BUN 22* 11 3* <5*  CREATININE 1.04* 0.58 0.41* 0.40*  CALCIUM 8.6* 7.8* 7.7* 8.0*   Liver Function Tests:  Recent Labs Lab 01/18/16 1200 01/19/16 0827 01/22/16 0618  AST 54* 46* 23  ALT 23 19 14   ALKPHOS 63 46 44  BILITOT 0.8 0.7 0.2*  PROT 7.4 5.7* 5.4*  ALBUMIN  4.1 3.1* 2.9*   No results for input(s): LIPASE, AMYLASE in the last 168 hours. No results for input(s): AMMONIA in the last 168 hours. CBC:  Recent Labs Lab 01/18/16 1200 01/19/16 0827 01/21/16 0601 01/22/16 0618  WBC 14.8* 7.4 1.8* 2.4*  NEUTROABS 12.8* 5.8  --  0.7*  HGB 12.8 11.1* 10.5* 10.3*  HCT 37.9 32.8* 31.3* 31.6*  MCV 84.2 85.6 86.2 86.8  PLT 180 163 175 191   Cardiac Enzymes: No results for input(s): CKTOTAL, CKMB, CKMBINDEX, TROPONINI in the last 168 hours. BNP: BNP (last 3 results) No results for input(s): BNP in the last 8760 hours.  ProBNP (last 3 results) No results for input(s): PROBNP in the last 8760 hours.  CBG: No results for input(s): GLUCAP in the last 168 hours.     SignedNita Sells MD   [facility] 01/23/2016, 8:40 AM

## 2016-01-23 NOTE — Care Management Note (Signed)
Case Management Note  Patient Details  Name: [name] MRN: [id number] Date of Birth: 23-Sep-1956  Subjective/Objective:                  Pt is from home, lives alone and is ind with ADL's. Pt has home O2 prior to admission. Pt discharging home with self care today.   Action/Plan: No CM needs.   Expected Discharge Date:      01/23/2016            Expected Discharge Plan:  Home/Self Care  In-House Referral:  NA  Discharge planning Services  CM Consult  Post Acute Care Choice:  NA Choice offered to:  NA  DME Arranged:    DME Agency:     HH Arranged:    HH Agency:     Status of Service:  Completed, signed off  Medicare Important Message Given:  Yes Date Medicare IM Given:    Medicare IM give by:    Date Additional Medicare IM Given:    Additional Medicare Important Message give by:     If discussed at [place] of Stay Meetings, dates discussed:    Additional Comments:  Sherald Barge, RN 01/23/2016, 1:26 PM

## 2016-01-23 NOTE — Progress Notes (Signed)
Pt discharged home today per Dr. Samtani. Pt's IV site D/C'd and WDL. Pt's VSS. Pt provided with home medication list, discharge instructions and prescriptions. Verbalized understanding. Pt left floor via WC in stable condition accompanied by NT. 

## 2017-09-26 ENCOUNTER — Other Ambulatory Visit: Payer: Self-pay | Admitting: Family Medicine

## 2017-09-26 DIAGNOSIS — Z1231 Encounter for screening mammogram for malignant neoplasm of breast: Secondary | ICD-10-CM

## 2017-10-21 ENCOUNTER — Ambulatory Visit
Admission: RE | Admit: 2017-10-21 | Discharge: 2017-10-21 | Disposition: A | Discharge status: Home / Self Care | Payer: Medicare HMO | Source: Ambulatory Visit | Attending: Family Medicine | Admitting: Family Medicine

## 2017-10-21 DIAGNOSIS — Z1231 Encounter for screening mammogram for malignant neoplasm of breast: Secondary | ICD-10-CM | POA: Insufficient documentation

## 2017-10-22 ENCOUNTER — Other Ambulatory Visit: Payer: Self-pay | Admitting: Family Medicine

## 2017-10-22 DIAGNOSIS — N6489 Other specified disorders of breast: Secondary | ICD-10-CM

## 2017-10-22 DIAGNOSIS — R928 Other abnormal and inconclusive findings on diagnostic imaging of breast: Secondary | ICD-10-CM

## 2017-11-06 ENCOUNTER — Ambulatory Visit
Admission: RE | Admit: 2017-11-06 | Discharge: 2017-11-06 | Disposition: A | Discharge status: Home / Self Care | Payer: Medicare HMO | Source: Ambulatory Visit | Attending: Family Medicine | Admitting: Family Medicine

## 2017-11-06 DIAGNOSIS — N6489 Other specified disorders of breast: Secondary | ICD-10-CM

## 2017-11-06 DIAGNOSIS — N6001 Solitary cyst of right breast: Secondary | ICD-10-CM | POA: Diagnosis not present

## 2017-11-06 DIAGNOSIS — R928 Other abnormal and inconclusive findings on diagnostic imaging of breast: Secondary | ICD-10-CM

## 2017-11-18 DIAGNOSIS — J189 Pneumonia, unspecified organism: Secondary | ICD-10-CM

## 2017-11-18 HISTORY — DX: Pneumonia, unspecified organism: J18.9

## 2018-01-22 ENCOUNTER — Inpatient Hospital Stay
Admission: EM | Admit: 2018-01-22 | Discharge: 2018-01-28 | DRG: 871 | Disposition: A | Discharge status: Home Health | Payer: Medicare HMO | Attending: Internal Medicine | Admitting: Internal Medicine

## 2018-01-22 ENCOUNTER — Encounter: Payer: Self-pay | Admitting: Emergency Medicine

## 2018-01-22 ENCOUNTER — Other Ambulatory Visit: Payer: Self-pay

## 2018-01-22 ENCOUNTER — Emergency Department: Payer: Medicare HMO

## 2018-01-22 DIAGNOSIS — Z87891 Personal history of nicotine dependence: Secondary | ICD-10-CM | POA: Diagnosis not present

## 2018-01-22 DIAGNOSIS — J9620 Acute and chronic respiratory failure, unspecified whether with hypoxia or hypercapnia: Secondary | ICD-10-CM | POA: Diagnosis present

## 2018-01-22 DIAGNOSIS — R0600 Dyspnea, unspecified: Secondary | ICD-10-CM

## 2018-01-22 DIAGNOSIS — J9621 Acute and chronic respiratory failure with hypoxia: Secondary | ICD-10-CM | POA: Diagnosis present

## 2018-01-22 DIAGNOSIS — I10 Essential (primary) hypertension: Secondary | ICD-10-CM | POA: Diagnosis present

## 2018-01-22 DIAGNOSIS — J441 Chronic obstructive pulmonary disease with (acute) exacerbation: Secondary | ICD-10-CM | POA: Diagnosis present

## 2018-01-22 DIAGNOSIS — Z9981 Dependence on supplemental oxygen: Secondary | ICD-10-CM

## 2018-01-22 DIAGNOSIS — A419 Sepsis, unspecified organism: Secondary | ICD-10-CM | POA: Diagnosis not present

## 2018-01-22 DIAGNOSIS — J9601 Acute respiratory failure with hypoxia: Secondary | ICD-10-CM | POA: Diagnosis present

## 2018-01-22 DIAGNOSIS — E785 Hyperlipidemia, unspecified: Secondary | ICD-10-CM | POA: Diagnosis present

## 2018-01-22 DIAGNOSIS — E876 Hypokalemia: Secondary | ICD-10-CM | POA: Diagnosis present

## 2018-01-22 DIAGNOSIS — Z7951 Long term (current) use of inhaled steroids: Secondary | ICD-10-CM

## 2018-01-22 DIAGNOSIS — J189 Pneumonia, unspecified organism: Secondary | ICD-10-CM | POA: Diagnosis present

## 2018-01-22 DIAGNOSIS — G629 Polyneuropathy, unspecified: Secondary | ICD-10-CM | POA: Diagnosis present

## 2018-01-22 DIAGNOSIS — Z7982 Long term (current) use of aspirin: Secondary | ICD-10-CM | POA: Diagnosis not present

## 2018-01-22 DIAGNOSIS — J440 Chronic obstructive pulmonary disease with acute lower respiratory infection: Secondary | ICD-10-CM | POA: Diagnosis present

## 2018-01-22 LAB — CBC WITH DIFFERENTIAL/PLATELET
BASOS PCT: 0 %
Basophils Absolute: 0 10*3/uL (ref 0–0.1)
EOS PCT: 0 %
Eosinophils Absolute: 0 10*3/uL (ref 0–0.7)
HCT: 38.9 % (ref 35.0–47.0)
Hemoglobin: 12.6 g/dL (ref 12.0–16.0)
Lymphocytes Relative: 7 %
Lymphs Abs: 2 10*3/uL (ref 1.0–3.6)
MCH: 28 pg (ref 26.0–34.0)
MCHC: 32.4 g/dL (ref 32.0–36.0)
MCV: 86.6 fL (ref 80.0–100.0)
MONO ABS: 2.3 10*3/uL — AB (ref 0.2–0.9)
Monocytes Relative: 8 %
NEUTROS ABS: 24.5 10*3/uL — AB (ref 1.4–6.5)
NEUTROS PCT: 85 %
Platelets: 521 10*3/uL — ABNORMAL HIGH (ref 150–440)
RBC: 4.49 MIL/uL (ref 3.80–5.20)
RDW: 13.7 % (ref 11.5–14.5)
WBC: 28.8 10*3/uL — ABNORMAL HIGH (ref 3.6–11.0)

## 2018-01-22 LAB — COMPREHENSIVE METABOLIC PANEL
ALBUMIN: 3.4 g/dL — AB (ref 3.5–5.0)
ALK PHOS: 68 U/L (ref 38–126)
ALT: 8 U/L — ABNORMAL LOW (ref 14–54)
AST: 20 U/L (ref 15–41)
Anion gap: 12 (ref 5–15)
BILIRUBIN TOTAL: 0.7 mg/dL (ref 0.3–1.2)
BUN: 14 mg/dL (ref 6–20)
CO2: 28 mmol/L (ref 22–32)
CREATININE: 0.75 mg/dL (ref 0.44–1.00)
Calcium: 8.3 mg/dL — ABNORMAL LOW (ref 8.9–10.3)
Chloride: 95 mmol/L — ABNORMAL LOW (ref 101–111)
GFR calc Af Amer: >60 mL/min (ref >60)
GFR calc non Af Amer: >60 mL/min (ref >60)
GLUCOSE: 100 mg/dL — AB (ref 65–99)
Potassium: 2.9 mmol/L — ABNORMAL LOW (ref 3.5–5.1)
Sodium: 135 mmol/L (ref 135–145)
TOTAL PROTEIN: 7.2 g/dL (ref 6.5–8.1)

## 2018-01-22 LAB — BASIC METABOLIC PANEL
Anion gap: 12 (ref 5–15)
BUN: 10 mg/dL (ref 6–20)
CALCIUM: 7.8 mg/dL — AB (ref 8.9–10.3)
CO2: 23 mmol/L (ref 22–32)
CREATININE: 0.69 mg/dL (ref 0.44–1.00)
Chloride: 102 mmol/L (ref 101–111)
GFR calc non Af Amer: >60 mL/min (ref >60)
Glucose, Bld: 255 mg/dL — ABNORMAL HIGH (ref 65–99)
Potassium: 3.6 mmol/L (ref 3.5–5.1)
SODIUM: 137 mmol/L (ref 135–145)

## 2018-01-22 LAB — BLOOD GAS, VENOUS
Acid-Base Excess: 7.7 mmol/L — ABNORMAL HIGH (ref 0.0–2.0)
Bicarbonate: 33.7 mmol/L — ABNORMAL HIGH (ref 20.0–28.0)
pCO2, Ven: 52 mmHg (ref 44.0–60.0)
pH, Ven: 7.42 (ref 7.250–7.430)

## 2018-01-22 LAB — TROPONIN I: Troponin I: <0.03 ng/mL (ref <0.03)

## 2018-01-22 LAB — BRAIN NATRIURETIC PEPTIDE: B Natriuretic Peptide: 39 pg/mL (ref 0.0–100.0)

## 2018-01-22 LAB — INFLUENZA PANEL BY PCR (TYPE A & B)
INFLBPCR: NEGATIVE
Influenza A By PCR: NEGATIVE

## 2018-01-22 LAB — PROCALCITONIN: Procalcitonin: 0.52 ng/mL

## 2018-01-22 LAB — LACTIC ACID, PLASMA: Lactic Acid, Venous: 1.7 mmol/L (ref 0.5–1.9)

## 2018-01-22 MED ORDER — TRAZODONE HCL 50 MG PO TABS
25.0000 mg | ORAL_TABLET | Freq: Every evening | ORAL | Status: DC | PRN
  Administered 2018-01-22 – 2018-01-24 (×3): 25 mg via ORAL
  Filled 2018-01-22 (×3): qty 1

## 2018-01-22 MED ORDER — CALCIUM CARBONATE ANTACID 500 MG PO CHEW
1.0000 | CHEWABLE_TABLET | Freq: Three times a day (TID) | ORAL | Status: AC
  Administered 2018-01-23 (×3): 200 mg via ORAL
  Filled 2018-01-22 (×3): qty 1

## 2018-01-22 MED ORDER — SODIUM CHLORIDE 0.9 % IV BOLUS (SEPSIS)
1000.0000 mL | Freq: Once | INTRAVENOUS | Status: AC
  Administered 2018-01-22: 1000 mL via INTRAVENOUS

## 2018-01-22 MED ORDER — OSELTAMIVIR PHOSPHATE 75 MG PO CAPS
75.0000 mg | ORAL_CAPSULE | Freq: Once | ORAL | Status: AC
  Administered 2018-01-22: 75 mg via ORAL
  Filled 2018-01-22: qty 1

## 2018-01-22 MED ORDER — VANCOMYCIN HCL IN DEXTROSE 1-5 GM/200ML-% IV SOLN
1000.0000 mg | INTRAVENOUS | Status: AC
  Administered 2018-01-22: 1000 mg via INTRAVENOUS
  Filled 2018-01-22: qty 200

## 2018-01-22 MED ORDER — IPRATROPIUM-ALBUTEROL 0.5-2.5 (3) MG/3ML IN SOLN
3.0000 mL | Freq: Once | RESPIRATORY_TRACT | Status: AC
  Administered 2018-01-22: 3 mL via RESPIRATORY_TRACT
  Filled 2018-01-22: qty 3

## 2018-01-22 MED ORDER — SIMVASTATIN 10 MG PO TABS
40.0000 mg | ORAL_TABLET | Freq: Every day | ORAL | Status: DC
  Administered 2018-01-22 – 2018-01-27 (×6): 40 mg via ORAL
  Filled 2018-01-22 (×7): qty 4

## 2018-01-22 MED ORDER — METHYLPREDNISOLONE SODIUM SUCC 125 MG IJ SOLR
125.0000 mg | Freq: Once | INTRAMUSCULAR | Status: AC
  Administered 2018-01-22: 125 mg via INTRAVENOUS
  Filled 2018-01-22: qty 2

## 2018-01-22 MED ORDER — ONDANSETRON HCL 4 MG/2ML IJ SOLN: 4.0000 mg | Freq: Four times a day (QID) | INTRAMUSCULAR | Status: DC | PRN

## 2018-01-22 MED ORDER — SODIUM CHLORIDE 0.9 % IV SOLN
1.0000 g | Freq: Once | INTRAVENOUS | Status: AC
  Administered 2018-01-22: 1 g via INTRAVENOUS
  Filled 2018-01-22: qty 10

## 2018-01-22 MED ORDER — ASPIRIN EC 81 MG PO TBEC
81.0000 mg | DELAYED_RELEASE_TABLET | Freq: Every day | ORAL | Status: DC
  Administered 2018-01-23 – 2018-01-28 (×6): 81 mg via ORAL
  Filled 2018-01-22 (×6): qty 1

## 2018-01-22 MED ORDER — ONDANSETRON HCL 4 MG PO TABS: 4.0000 mg | ORAL_TABLET | Freq: Four times a day (QID) | ORAL | Status: DC | PRN

## 2018-01-22 MED ORDER — VANCOMYCIN HCL IN DEXTROSE 1-5 GM/200ML-% IV SOLN
1000.0000 mg | INTRAVENOUS | Status: DC
  Administered 2018-01-22: 1000 mg via INTRAVENOUS
  Filled 2018-01-22 (×3): qty 200

## 2018-01-22 MED ORDER — IPRATROPIUM-ALBUTEROL 0.5-2.5 (3) MG/3ML IN SOLN
3.0000 mL | Freq: Four times a day (QID) | RESPIRATORY_TRACT | Status: DC
  Administered 2018-01-22 – 2018-01-28 (×23): 3 mL via RESPIRATORY_TRACT
  Filled 2018-01-22 (×23): qty 3

## 2018-01-22 MED ORDER — BISACODYL 5 MG PO TBEC: 5.0000 mg | DELAYED_RELEASE_TABLET | Freq: Every day | ORAL | Status: DC | PRN

## 2018-01-22 MED ORDER — ACETAMINOPHEN 325 MG PO TABS: 650.0000 mg | ORAL_TABLET | Freq: Four times a day (QID) | ORAL | Status: DC | PRN

## 2018-01-22 MED ORDER — DOCUSATE SODIUM 100 MG PO CAPS
100.0000 mg | ORAL_CAPSULE | Freq: Two times a day (BID) | ORAL | Status: DC
  Administered 2018-01-22 – 2018-01-28 (×11): 100 mg via ORAL
  Filled 2018-01-22 (×12): qty 1

## 2018-01-22 MED ORDER — ENOXAPARIN SODIUM 40 MG/0.4ML ~~LOC~~ SOLN
40.0000 mg | SUBCUTANEOUS | Status: DC
  Administered 2018-01-22 – 2018-01-27 (×6): 40 mg via SUBCUTANEOUS
  Filled 2018-01-22 (×6): qty 0.4

## 2018-01-22 MED ORDER — ASPIRIN EC 81 MG PO TBEC
DELAYED_RELEASE_TABLET | ORAL | Status: AC
  Filled 2018-01-22: qty 1

## 2018-01-22 MED ORDER — IPRATROPIUM-ALBUTEROL 0.5-2.5 (3) MG/3ML IN SOLN
RESPIRATORY_TRACT | Status: AC
  Filled 2018-01-22: qty 3

## 2018-01-22 MED ORDER — NORTRIPTYLINE HCL 25 MG PO CAPS
25.0000 mg | ORAL_CAPSULE | Freq: Every day | ORAL | Status: DC
  Administered 2018-01-22 – 2018-01-27 (×6): 25 mg via ORAL
  Filled 2018-01-22 (×7): qty 1

## 2018-01-22 MED ORDER — ACETAMINOPHEN 650 MG RE SUPP: 650.0000 mg | Freq: Four times a day (QID) | RECTAL | Status: DC | PRN

## 2018-01-22 MED ORDER — METHYLPREDNISOLONE SODIUM SUCC 125 MG IJ SOLR
60.0000 mg | INTRAMUSCULAR | Status: DC
  Administered 2018-01-23: 60 mg via INTRAVENOUS
  Filled 2018-01-22: qty 2

## 2018-01-22 MED ORDER — PIPERACILLIN-TAZOBACTAM 3.375 G IVPB
3.3750 g | Freq: Three times a day (TID) | INTRAVENOUS | Status: DC
  Administered 2018-01-22 – 2018-01-23 (×2): 3.375 g via INTRAVENOUS
  Filled 2018-01-22 (×2): qty 50

## 2018-01-22 MED ORDER — AZITHROMYCIN 500 MG PO TABS
500.0000 mg | ORAL_TABLET | Freq: Once | ORAL | Status: AC
Start: 2018-01-22 — End: 2018-01-22
  Administered 2018-01-22: 500 mg via ORAL
  Filled 2018-01-22: qty 1

## 2018-01-22 MED ORDER — HYDROCODONE-ACETAMINOPHEN 5-325 MG PO TABS: 1.0000 | ORAL_TABLET | ORAL | Status: DC | PRN

## 2018-01-22 MED ORDER — POTASSIUM CHLORIDE IN NACL 40-0.9 MEQ/L-% IV SOLN
INTRAVENOUS | Status: DC
  Administered 2018-01-22 – 2018-01-23 (×2): 100 mL/h via INTRAVENOUS
  Filled 2018-01-22 (×4): qty 1000

## 2018-01-22 MED ORDER — INFLUENZA VAC SPLIT QUAD 0.5 ML IM SUSY: 0.5000 mL | PREFILLED_SYRINGE | INTRAMUSCULAR | Status: DC

## 2018-01-22 MED ORDER — FERROUS SULFATE 325 (65 FE) MG PO TABS
325.0000 mg | ORAL_TABLET | Freq: Two times a day (BID) | ORAL | Status: DC
  Administered 2018-01-23 – 2018-01-28 (×11): 325 mg via ORAL
  Filled 2018-01-22 (×11): qty 1

## 2018-01-22 MED ORDER — ALBUTEROL SULFATE (2.5 MG/3ML) 0.083% IN NEBU: 3.0000 mL | INHALATION_SOLUTION | Freq: Four times a day (QID) | RESPIRATORY_TRACT | Status: DC | PRN

## 2018-01-22 MED ORDER — MAGNESIUM SULFATE 2 GM/50ML IV SOLN
2.0000 g | Freq: Once | INTRAVENOUS | Status: AC
  Administered 2018-01-22: 2 g via INTRAVENOUS
  Filled 2018-01-22: qty 50

## 2018-01-22 NOTE — ED Notes (Signed)
Pt nasal cannula bumped down to 4L at this time; goal oxygen saturation remains 88-92% per EDP.

## 2018-01-22 NOTE — ED Notes (Addendum)
Attempted to call report, bed unavailable at this time.  ED charge RN aware.

## 2018-01-22 NOTE — ED Provider Notes (Signed)
[facility] Emergency Department Provider Note  ____________________________________________   First MD Initiated Contact with Patient 01/22/18 1215     (approximate)  I have reviewed the triage vital signs and the nursing notes.   HISTORY  Chief Complaint Fever and Shortness of Breath   HPI [name] is a 62 y.o. female he self presents to the emergency department with worsening cough and shortness of breath.  She has a long-standing history of COPD and uses 2 L of oxygen at baseline at home.  She is recently completed multiple courses of oral antibiotics and oral steroids without improvement.  Her symptoms have been gradual onset.  There is slowly progressive.  They are now moderate to severe.  Her symptoms are worse with exertion.  She has sharp upper chest pain worse with coughing improved with rest.  She reports a fever to 104 degrees at home today.  Past Medical History:  Diagnosis Date  . COPD (chronic obstructive pulmonary disease) ([place])   . High cholesterol   . Hypertension     Patient Active Problem List   Diagnosis Date Noted  . Acute on chronic respiratory failure ([place]) 01/22/2018  . CAP (community acquired pneumonia) 01/18/2016  . Anemia, iron deficiency 11/07/2015  . Chronic respiratory failure ([place]) 11/07/2015  . Acute on chronic respiratory failure with hypoxia ([place]) 11/07/2015  . Hyperlipidemia 11/07/2015  . COPD with acute exacerbation ([place]) 11/06/2015  . Hypertension 11/06/2015    Past Surgical History:  Procedure Laterality Date  . HALLUX VALGUS BASE WEDGE Right 06/09/2015   Procedure: Base wedge osteotomy with modified McBride right foot ;  Surgeon: Sharlotte Alamo, MD;  Location: [facility] ORS;  Service: Podiatry;  Laterality: Right;    Prior to Admission medications   Medication Sig Start Date End Date Taking? Authorizing Provider  albuterol (PROVENTIL HFA;VENTOLIN HFA) 108 (90 BASE) MCG/ACT inhaler Inhale 4-6 puffs into the  lungs every 6 (six) hours as needed for wheezing or shortness of breath.   Yes [provider]  amLODipine (NORVASC) 5 MG tablet Take 5 mg by mouth at bedtime.   Yes [provider]  aspirin EC 81 MG tablet Take 81 mg by mouth daily.   Yes [provider]  ferrous sulfate 325 (65 FE) MG tablet Take 325 mg by mouth 2 (two) times daily with a meal.    Yes [provider]  fluticasone furoate-vilanterol (BREO ELLIPTA) 100-25 MCG/INH AEPB Inhale 1 puff into the lungs daily.   Yes [provider]  gabapentin (NEURONTIN) 300 MG capsule Take 600 mg by mouth 3 (three) times daily.    Yes [provider]  ipratropium (ATROVENT HFA) 17 MCG/ACT inhaler Inhale 2 puffs into the lungs every 4 (four) hours as needed for wheezing.   Yes [provider]  ipratropium-albuterol (DUONEB) 0.5-2.5 (3) MG/3ML SOLN Take 0.5 mg by nebulization every 6 (six) hours as needed. 08/03/12  Yes [provider]  levalbuterol (XOPENEX) 1.25 MG/3ML nebulizer solution Take 1.25 mg by nebulization every 6 (six) hours as needed for wheezing or shortness of breath.    Yes [provider]  nortriptyline (PAMELOR) 25 MG capsule Take 25 mg by mouth at bedtime.   Yes [provider]  OXYGEN Inhale 2 L into the lungs continuous.   Yes [provider]  simvastatin (ZOCOR) 40 MG tablet Take 40 mg by mouth at bedtime.   Yes [provider]  tiotropium (SPIRIVA) 18 MCG inhalation capsule Place 18 mcg  into inhaler and inhale daily.   Yes [provider]  doxycycline (VIBRA-TABS) 100 MG tablet Take 1 tablet (100 mg total) by mouth every 12 (twelve) hours. Patient not taking: Reported on 01/22/2018 01/23/16   Nita Sells, MD  guaiFENesin (MUCINEX) 600 MG 12 hr tablet Take 1 tablet (600 mg total) by mouth 2 (two) times daily. Patient not taking: Reported on 01/18/2016 11/07/15   Dhungel, Flonnie Overman, MD  ibuprofen (ADVIL,MOTRIN) 400 MG  tablet Take 400 mg by mouth every 6 (six) hours as needed for moderate pain.    [provider]  ondansetron (ZOFRAN) 8 MG tablet Take 1 tablet (8 mg total) by mouth every 8 (eight) hours as needed for nausea or vomiting. Patient not taking: Reported on 01/18/2016 06/24/15   Triplett, Tammy, PA-C  potassium chloride SA (K-DUR,KLOR-CON) 20 MEQ tablet Take 2 tablets (40 mEq total) by mouth daily. Patient not taking: Reported on 01/22/2018 01/23/16   Nita Sells, MD    Allergies Patient has no known allergies.  Family History  Problem Relation Age of Onset  . Breast cancer Neg Hx     Social History Social History   Tobacco Use  . Smoking status: Former Smoker    Last attempt to quit: 11/04/2013    Years since quitting: 4.2  . Smokeless tobacco: Never Used  Substance Use Topics  . Alcohol use: No  . Drug use: No    Review of Systems Constitutional: Positive for fever Eyes: No visual changes. ENT: No sore throat. Cardiovascular: Positive for chest pain. Respiratory: Positive for shortness of breath. Gastrointestinal: No abdominal pain.  No nausea, no vomiting.  No diarrhea.  No constipation. Genitourinary: Negative for dysuria. Musculoskeletal: Negative for back pain. Skin: Negative for rash. Neurological: Negative for headaches, focal weakness or numbness.   ____________________________________________   PHYSICAL EXAM:  VITAL SIGNS: ED Triage Vitals  Enc Vitals Group     BP 01/22/18 1155 (!) 104/50     Pulse Rate 01/22/18 1155 (!) 142     Resp 01/22/18 1155 20     Temp 01/22/18 1155 99.9 F (37.7 C)     Temp Source 01/22/18 1155 Oral     SpO2 01/22/18 1155 (!) 79 %     Weight 01/22/18 1154 119 lb (54 kg)     Height 01/22/18 1154 5' (1.524 m)     Head Circumference --      Peak Flow --      Pain Score --      Pain Loc --      Pain Edu? --      Excl. in [place]? --     Constitutional: Moderate respiratory distress appears extremely uncomfortable  elevated respiratory rate using accessory muscles diaphoretic Eyes: PERRL EOMI. Head: Atraumatic. Nose: No congestion/rhinnorhea. Mouth/Throat: No trismus Neck: No stridor.   Cardiovascular: Tachycardic rate, regular rhythm. Grossly normal heart sounds.  Good peripheral circulation. Respiratory: Moderate respiratory distress using accessory muscles limited air movement bilaterally with wheezes and rhonchi bilateral bases Gastrointestinal: Soft nontender Musculoskeletal: No lower extremity edema   Neurologic:   No gross focal neurologic deficits are appreciated. Skin: Diaphoretic Psychiatric: Anxious appearing    ____________________________________________   DIFFERENTIAL includes but not limited to  Pneumonia, pneumothorax, pulmonary embolism, influenza, COPD exacerbation ____________________________________________   LABS (all labs ordered are listed, but only abnormal results are displayed)  Labs Reviewed  COMPREHENSIVE METABOLIC PANEL - Abnormal; Notable for the following components:      Result Value   Potassium 2.9 (*)  Chloride 95 (*)    Glucose, Bld 100 (*)    Calcium 8.3 (*)    Albumin 3.4 (*)    ALT 8 (*)    All other components within normal limits  CBC WITH DIFFERENTIAL/PLATELET - Abnormal; Notable for the following components:   WBC 28.8 (*)    Platelets 521 (*)    Neutro Abs 24.5 (*)    Monocytes Absolute 2.3 (*)    All other components within normal limits  BLOOD GAS, VENOUS - Abnormal; Notable for the following components:   Bicarbonate 33.7 (*)    Acid-Base Excess 7.7 (*)    All other components within normal limits  CULTURE, BLOOD (ROUTINE X 2)  CULTURE, BLOOD (ROUTINE X 2)  LACTIC ACID, PLASMA  TROPONIN I  PROCALCITONIN  BRAIN NATRIURETIC PEPTIDE  INFLUENZA PANEL BY PCR (TYPE A & B)  HIV ANTIBODY (ROUTINE TESTING)  CBC  CREATININE, SERUM    Lab work reviewed by me shows a number of abnormalities most concerning is a white count of 29 which  is concerning for bacterial infection although is likely complicated by recent steroid use __________________________________________  EKG  ED ECG REPORT I, Darel Hong, the attending physician, personally viewed and interpreted this ECG.  Date: 01/22/2018 EKG Time:  Rate: 132 Rhythm: Sinus tachycardia QRS Axis: Rightward axis Intervals: normal ST/T Wave abnormalities: normal Narrative Interpretation: no evidence of acute ischemia  ____________________________________________  RADIOLOGY  Chest x-ray reviewed by me with bibasilar pneumonia ____________________________________________   PROCEDURES  Procedure(s) performed: no  .Critical Care Performed by: Darel Hong, MD Authorized by: Darel Hong, MD   Critical care provider statement:    Critical care time (minutes):  35   Critical care time was exclusive of:  Separately billable procedures and treating other patients   Critical care was necessary to treat or prevent imminent or life-threatening deterioration of the following conditions:  Respiratory failure and sepsis   Critical care was time spent personally by me on the following activities:  Development of treatment plan with patient or surrogate, discussions with consultants, evaluation of patient's response to treatment, examination of patient, obtaining history from patient or surrogate, ordering and performing treatments and interventions, ordering and review of laboratory studies, ordering and review of radiographic studies, pulse oximetry, re-evaluation of patient's condition and review of old charts     Critical Care performed: yes  Observation: no ____________________________________________   INITIAL IMPRESSION / ASSESSMENT AND PLAN / ED COURSE  Pertinent labs & imaging results that were available during my care of the patient were reviewed by me and considered in my medical decision making (see chart for details).  The patient arrives  ill-appearing with elevated work of breathing tachycardic to the 130s and hypoxic to 79% on room air.  On 6 L she only came up to the mid 28s and she required nonrebreather facemask to come up to the 90s.  She has a history of severe COPD and has already failed multiple rounds of outpatient antibiotics and steroids.  Her lungs     ----------------------------------------- 2:49 PM on 01/22/2018 -----------------------------------------  The patient is now saturating 88% on 6 L and her heart rate has come down to 125.  Her chest x-ray is concerning with bibasilar pneumonia.  Given her increased oxygen demand I do believe she requires inpatient admission for IV antibiotics, IV fluids, and continued resuscitation.  The patient verbalizes understanding and agreement the plan.  I then discussed with the hospitalist who has graciously agreed to admit  the patient to her service. ____________________________________________   FINAL CLINICAL IMPRESSION(S) / ED DIAGNOSES  Final diagnoses:  Pneumonia of both lungs due to infectious organism, unspecified part of lung  Sepsis, due to unspecified organism ([place])  Acute respiratory failure with hypoxia ([place])      NEW MEDICATIONS STARTED DURING THIS VISIT:  New Prescriptions   No medications on file     Note:  This document was prepared using Dragon voice recognition software and may include unintentional dictation errors.     Darel Hong, MD 01/22/18 1450

## 2018-01-22 NOTE — Progress Notes (Signed)
Family Meeting Note  Advance Directive: Yes  Today a meeting took place with the Patient.  D/w with patient about her medical problems, underlying chronic lung disease, she wanted to be full code at this time.  Want CPR, intubation if needed.   The following clinical team members were present during this meeting:MD  The following were discussed:Patient's diagnosis: , Patient's progosis: Unable to determine and Goals for treatment: Aggressive interventions and she is hopeful that she will improve. Additional follow-up to be provided: All her diarrhea and make further recommendation. Time spent during discussion:15 minutes  Epifanio Lesches, MD

## 2018-01-22 NOTE — Progress Notes (Addendum)
CODE SEPSIS - PHARMACY COMMUNICATION  **Broad Spectrum Antibiotics should be administered within 1 hour of Sepsis diagnosis**  Time Code Sepsis Called/Page Received: 3/7 @ 1220  Antibiotics Ordered: Ceftriaxone 1g IV                           Azithromycin 500mg  IV                                      Oseltamivir 75mg   Time of 1st antibiotic administration: 3/7 1228  Additional action taken by pharmacy: N/A  If necessary, Name of Provider/Nurse Contacted: N/A   Pernell Dupre, PharmD, BCPS Clinical Pharmacist 01/22/2018 2:11 PM

## 2018-01-22 NOTE — ED Notes (Signed)
Pt placed on 6L nasal cannula at this time; goal oxygen saturation 88-92% per EDP.

## 2018-01-22 NOTE — ED Triage Notes (Addendum)
Pt to ED from home c/o SOB, productive cough, and chest pain approx. 1 month.  States productive green cough, pain generalized throughout chest, worse with deep breaths and cough.  States pneumonia in the past and feels similar.  Came to triage 78% placed on oxygen and tachycardic.

## 2018-01-22 NOTE — ED Notes (Signed)
Pt provided dinner tray at this time.

## 2018-01-22 NOTE — Progress Notes (Signed)
Patient breathing improved, now on 4 L of oxygen, most recent blood pressure is 107/76.  Spoke with Dr. Jamal Collin, patient will be downgraded to regular medical unit instead of stepdown unit.  Orders are placed in the computer.

## 2018-01-22 NOTE — Progress Notes (Addendum)
Electrolyte replacement  3/7 2200  BMP Corrected calcium 8.3. CaCO3 500 mg TID x 3 doses ordered. Recheck BMP in AM.  03/08 AM electrolyte panel WNL except calcium still low. Replacement CaCO3 as above. Recheck with tomorrow AM labs.  Sim Boast, PharmD, BCPS  01/22/18 11:47 PM

## 2018-01-22 NOTE — H&P (Signed)
[place] [facility] [place] NAME: [name]    MR#:  [id number]  DATE OF BIRTH:  1956-07-24  DATE OF ADMISSION:  01/22/2018  PRIMARY CARE PHYSICIAN: [facility] [place]   REQUESTING/REFERRING PHYSICIAN: Dr. Mable Paris  CHIEF COMPLAINT: Shortness of breath   Chief Complaint  Patient presents with  . Fever  . Shortness of Breath    HISTORY OF PRESENT ILLNESS:  [name]  is a 62 y.o. female with a known history of COPD on 2 L of oxygen at home comes in because of worsening shortness of breath, cough, high fever up to 103 Fahrenheit this morning.  Patient finished 2 rounds of steroids last dose was yesterday, also took Levaquin prescribed by PCP recently.  Follows up with Dr. Raul Del for her COPD.  Patient continued to have shortness of breath, cough, when she came she was hypoxic to 78% on room air when she came along with tachycardia.   oxygen saturation remained low to 84% on 6 L so she was given nonrebreather to maintain sats more than 90%.  In the ER chest x-ray showed bilateral pneumonia.  During my visit patient remained on 6 L with saturation between 87-90%.  Still had deep cough, also has hypotension with blood pressure around 90s over 70s and tachycardic with up to 120 bpm.  And has elevated lactic acid up to 1.7, pro-calcitonin 0.52.  Worsening shortness of breath, cough going on for about 2 weeks despite steroids, outpatient antibiotics.  PAST MEDICAL HISTORY:   Past Medical History:  Diagnosis Date  . COPD (chronic obstructive pulmonary disease) ([place])   . High cholesterol   . Hypertension     PAST SURGICAL HISTOIRY:   Past Surgical History:  Procedure Laterality Date  . HALLUX VALGUS BASE WEDGE Right 06/09/2015   Procedure: Base wedge osteotomy with modified McBride right foot ;  Surgeon: Sharlotte Alamo, MD;  Location: [facility] ORS;  Service: Podiatry;  Laterality: Right;    SOCIAL HISTORY:   Social History   Tobacco  Use  . Smoking status: Former Smoker    Last attempt to quit: 11/04/2013    Years since quitting: 4.2  . Smokeless tobacco: Never Used  Substance Use Topics  . Alcohol use: No    FAMILY HISTORY:   Family History  Problem Relation Age of Onset  . Breast cancer Neg Hx     DRUG ALLERGIES:  No Known Allergies  REVIEW OF SYSTEMS:  CONSTITUTIONAL: High fever today morning. EYES: No blurred or double vision.  EARS, NOSE, AND THROAT: No tinnitus or ear pain.  RESPIRATORY: Cough, worsening shortness of breath, wheezing cARDIOVASCULAR: No chest pain, orthopnea, edema.  GASTROINTESTINAL: No nausea, vomiting, diarrhea or abdominal pain.  GENITOURINARY: No dysuria, hematuria.  ENDOCRINE: No polyuria, nocturia,  HEMATOLOGY: No anemia, easy bruising or bleeding SKIN: No rash or lesion. MUSCULOSKELETAL: No joint pain or arthritis.   NEUROLOGIC: No tingling, numbness, weakness.  PSYCHIATRY: No anxiety or depression.   MEDICATIONS AT HOME:   Prior to Admission medications   Medication Sig Start Date End Date Taking? Authorizing Provider  albuterol (PROVENTIL HFA;VENTOLIN HFA) 108 (90 BASE) MCG/ACT inhaler Inhale 4-6 puffs into the lungs every 6 (six) hours as needed for wheezing or shortness of breath.   Yes [provider]  amLODipine (NORVASC) 5 MG tablet Take 5 mg by mouth at bedtime.   Yes [provider]  aspirin EC 81 MG tablet Take 81 mg by mouth daily.  Yes [provider]  ferrous sulfate 325 (65 FE) MG tablet Take 325 mg by mouth 2 (two) times daily with a meal.    Yes [provider]  fluticasone furoate-vilanterol (BREO ELLIPTA) 100-25 MCG/INH AEPB Inhale 1 puff into the lungs daily.   Yes [provider]  gabapentin (NEURONTIN) 300 MG capsule Take 600 mg by mouth 3 (three) times daily.    Yes [provider]  ipratropium (ATROVENT HFA) 17 MCG/ACT inhaler Inhale 2 puffs into the lungs every 4 (four) hours as needed for  wheezing.   Yes [provider]  ipratropium-albuterol (DUONEB) 0.5-2.5 (3) MG/3ML SOLN Take 0.5 mg by nebulization every 6 (six) hours as needed. 08/03/12  Yes [provider]  levalbuterol (XOPENEX) 1.25 MG/3ML nebulizer solution Take 1.25 mg by nebulization every 6 (six) hours as needed for wheezing or shortness of breath.    Yes [provider]  nortriptyline (PAMELOR) 25 MG capsule Take 25 mg by mouth at bedtime.   Yes [provider]  OXYGEN Inhale 2 L into the lungs continuous.   Yes [provider]  simvastatin (ZOCOR) 40 MG tablet Take 40 mg by mouth at bedtime.   Yes [provider]  tiotropium (SPIRIVA) 18 MCG inhalation capsule Place 18 mcg into inhaler and inhale daily.   Yes [provider]  doxycycline (VIBRA-TABS) 100 MG tablet Take 1 tablet (100 mg total) by mouth every 12 (twelve) hours. Patient not taking: Reported on 01/22/2018 01/23/16   Nita Sells, MD  guaiFENesin (MUCINEX) 600 MG 12 hr tablet Take 1 tablet (600 mg total) by mouth 2 (two) times daily. Patient not taking: Reported on 01/18/2016 11/07/15   Dhungel, Flonnie Overman, MD  ibuprofen (ADVIL,MOTRIN) 400 MG tablet Take 400 mg by mouth every 6 (six) hours as needed for moderate pain.    [provider]  ondansetron (ZOFRAN) 8 MG tablet Take 1 tablet (8 mg total) by mouth every 8 (eight) hours as needed for nausea or vomiting. Patient not taking: Reported on 01/18/2016 06/24/15   Triplett, Tammy, PA-C  potassium chloride SA (K-DUR,KLOR-CON) 20 MEQ tablet Take 2 tablets (40 mEq total) by mouth daily. Patient not taking: Reported on 01/22/2018 01/23/16   Nita Sells, MD      VITAL SIGNS:  Blood pressure (!) 99/59, pulse (!) 107, temperature 99.9 F (37.7 C), temperature source Oral, resp. rate 17, height 5' (1.524 m), weight 54 kg (119 lb), SpO2 (!) 89 %.  PHYSICAL EXAMINATION:  GENERAL:  62 y.o.-year-old patient lying in the bed with some  respiratory distress  EYES: Pupils equal, round, reactive to light , accommodation. No scleral icterus. Extraocular muscles intact.  HEENT: Head atraumatic, normocephalic. Oropharynx and nasopharynx clear.  NECK:  Supple, no jugular venous distention. No thyroid enlargement, no tenderness.  LUNGS: Expiratory wheeze bilaterally, not using accessory muscles of respiration. CARDIOVASCULAR: S1, S2 tachycardiac.. No murmurs, rubs, or gallops.  ABDOMEN: Soft, nontender, nondistended. Bowel sounds present. No organomegaly or mass.  EXTREMITIES: No pedal edema, cyanosis, or clubbing.  NEUROLOGIC: Cranial nerves II through XII are intact. Muscle strength 5/5 in all extremities. Sensation intact. Gait not checked.  PSYCHIATRIC: The patient is alert and oriented x 3.  SKIN: No obvious rash, lesion, or ulcer.   LABORATORY PANEL:   CBC Recent Labs  Lab 01/22/18 1210  WBC 28.8*  HGB 12.6  HCT 38.9  PLT 521*   ------------------------------------------------------------------------------------------------------------------  Chemistries  Recent Labs  Lab 01/22/18 1210  NA 135  K 2.9*  CL 95*  CO2 28  GLUCOSE 100*  BUN 14  CREATININE 0.75  CALCIUM 8.3*  AST 20  ALT 8*  ALKPHOS 68  BILITOT 0.7   ------------------------------------------------------------------------------------------------------------------  Cardiac Enzymes Recent Labs  Lab 01/22/18 1210  TROPONINI <0.03   ------------------------------------------------------------------------------------------------------------------  RADIOLOGY:  Dg Chest Port 1 View  Result Date: 01/22/2018 CLINICAL DATA:  Short of breath and cough EXAM: PORTABLE CHEST 1 VIEW COMPARISON:  01/19/2016 FINDINGS: COPD with hyperinflation. Bibasilar airspace disease has developed since prior study and may represent pneumonia, right greater than left. Possible small right effusion. No definite heart failure IMPRESSION: COPD.  Bibasilar airspace  disease, probable pneumonia Electronically Signed   By: Franchot Gallo M.D.   On: 01/22/2018 12:24    EKG:   Orders placed or performed during the hospital encounter of 01/22/18  . EKG 12-Lead  . EKG 12-Lead  . EKG 12-Lead  . EKG 12-Lead  . EKG 12-Lead  . EKG 12-Lead    IMPRESSION AND PLAN:   62 year old female patient of severe COPD on oxygen 2 L at home, lipidemia, neuropathy hyperlipidemia, neuropathy with worsening shortness of breath associated with high fever.  Patient failed outpatient antibiotic, steroid therapy, hypoxic requiring up to 6 L of oxygen here, patient was given Z-Pak and also Levaquin recently by pulmonary and also PCP. 1.  Sepsis secondary to bilateral pneumonia with evidence of high fever, tachycardia hypertension: Admit to stepdown unit, continue IV antibiotics, IV fluids, follow blood cultures, spoke with Dr. Rosita Fire over the phone. 2.  Acute on chronic respiratory failure secondary to bibasilar pneumonia, COPD exacerbation: Continue IV antibiotics with Vanco and Zosyn, IV steroids, bronchodilators #3 . severe hypokalemia secondary to sepsis: Continue IV fluids with potassium supplements.,  checkMagnesium level also.. 4./.LeucoCytosis with white count up to 28 likely secondary to sepsis and also due to recent steroid use.  All the records are reviewed and case discussed with ED provider. Management plans discussed with the patient, family and they are in agreement.  CODE STATUS: full  TOTAL TIME TAKING CARE OF THIS PATIENT:61minutes.    Epifanio Lesches M.D on 01/22/2018 at 3:46 PM  Between 7am to 6pm - Pager - [phone]  After 6pm go to [email or web address] - password [email or web address] [place] [facility]  Office  [phone]  CC: Primary care physician; [facility]  Note: This dictation was prepared with Dragon dictation along with smaller phrase technology. Any transcriptional errors that result from this process are  unintentional.

## 2018-01-22 NOTE — ED Notes (Signed)
Pt 84% on 6L nasal cannula, pt placed on NRB mask at this time to maintain saturation >90%.  EDP called to bedside.

## 2018-01-22 NOTE — Consult Note (Signed)
Pharmacy Antibiotic Note  [name] is a 62 y.o. female admitted on 01/22/2018 with sepsis.  Pharmacy has been consulted for Zosyn and Vancomycin dosing. Patient received Ceftriaxone 1g and Azithromycin 500mg  IV  x 1 dose in ED.   Plan: Ke: 0.054  T1/2: 12.83   Vd: 46.69  Vancomycin 1g IV every 18 hours with 6 hour stack dosing. Calculated trough at Css is 15.8. Trough level ordered prior to 4th dose. Will monitor renal function.  MRSA PCR ordered- if negative, recommend discontinuing vancomycin.   Start Zosyn 3.375 IV EI every 8 hours.   Height: 5' (152.4 cm) Weight: 119 lb (54 kg) IBW/kg (Calculated) : 45.5  Temp (24hrs), Avg:99.9 F (37.7 C), Min:99.9 F (37.7 C), Max:99.9 F (37.7 C)  Recent Labs  Lab 01/22/18 1210 01/22/18 1224  WBC 28.8*  --   CREATININE 0.75  --   LATICACIDVEN  --  1.7    Estimated Creatinine Clearance: 54.4 mL/min (by C-G formula based on SCr of 0.75 mg/dL).    No Known Allergies  Antimicrobials this admission: 3/7 ceftriaxone/azithromcyin  >> x 1 dose  3/7 Zosyn  >>  3/7 Vancomycin >>   Dose adjustments this admission:  Microbiology results: 3/7  BCx: pending 3/7  MRSA PCR: pending  Thank you for allowing pharmacy to be a part of this patient's care.  Pernell Dupre, PharmD, BCPS Clinical Pharmacist 01/22/2018 2:50 PM

## 2018-01-23 LAB — BASIC METABOLIC PANEL
ANION GAP: 8 (ref 5–15)
BUN: 11 mg/dL (ref 6–20)
CHLORIDE: 105 mmol/L (ref 101–111)
CO2: 26 mmol/L (ref 22–32)
Calcium: 8.1 mg/dL — ABNORMAL LOW (ref 8.9–10.3)
Creatinine, Ser: 0.54 mg/dL (ref 0.44–1.00)
GFR calc Af Amer: >60 mL/min (ref >60)
GFR calc non Af Amer: >60 mL/min (ref >60)
GLUCOSE: 100 mg/dL — AB (ref 65–99)
POTASSIUM: 3.8 mmol/L (ref 3.5–5.1)
Sodium: 139 mmol/L (ref 135–145)

## 2018-01-23 LAB — CBC
HCT: 32.8 % — ABNORMAL LOW (ref 35.0–47.0)
HEMOGLOBIN: 10.9 g/dL — AB (ref 12.0–16.0)
MCH: 28.7 pg (ref 26.0–34.0)
MCHC: 33.2 g/dL (ref 32.0–36.0)
MCV: 86.5 fL (ref 80.0–100.0)
PLATELETS: 436 10*3/uL (ref 150–440)
RBC: 3.8 MIL/uL (ref 3.80–5.20)
RDW: 13.4 % (ref 11.5–14.5)
WBC: 22.7 10*3/uL — ABNORMAL HIGH (ref 3.6–11.0)

## 2018-01-23 LAB — GLUCOSE, CAPILLARY: Glucose-Capillary: 84 mg/dL (ref 65–99)

## 2018-01-23 LAB — MAGNESIUM: MAGNESIUM: 2.2 mg/dL (ref 1.7–2.4)

## 2018-01-23 MED ORDER — AMLODIPINE BESYLATE 5 MG PO TABS
5.0000 mg | ORAL_TABLET | Freq: Every day | ORAL | Status: DC
  Administered 2018-01-23 – 2018-01-27 (×5): 5 mg via ORAL
  Filled 2018-01-23 (×5): qty 1

## 2018-01-23 MED ORDER — BUDESONIDE 0.5 MG/2ML IN SUSP
0.5000 mg | Freq: Two times a day (BID) | RESPIRATORY_TRACT | Status: DC
  Administered 2018-01-23 – 2018-01-28 (×11): 0.5 mg via RESPIRATORY_TRACT
  Filled 2018-01-23 (×12): qty 2

## 2018-01-23 MED ORDER — PANTOPRAZOLE SODIUM 40 MG PO TBEC
40.0000 mg | DELAYED_RELEASE_TABLET | Freq: Every day | ORAL | Status: DC
  Administered 2018-01-23 – 2018-01-24 (×2): 40 mg via ORAL
  Filled 2018-01-23 (×2): qty 1

## 2018-01-23 MED ORDER — LEVOFLOXACIN IN D5W 750 MG/150ML IV SOLN
750.0000 mg | Freq: Every day | INTRAVENOUS | Status: DC
  Administered 2018-01-23 – 2018-01-24 (×2): 750 mg via INTRAVENOUS
  Filled 2018-01-23 (×2): qty 150

## 2018-01-23 MED ORDER — METHYLPREDNISOLONE SODIUM SUCC 40 MG IJ SOLR
40.0000 mg | Freq: Two times a day (BID) | INTRAMUSCULAR | Status: DC
  Administered 2018-01-23 – 2018-01-24 (×2): 40 mg via INTRAVENOUS
  Filled 2018-01-23 (×2): qty 1

## 2018-01-23 NOTE — Care Management Important Message (Signed)
Important Message  Patient Details  Name: [name] MRN: [id number] Date of Birth: 03/18/1956   Medicare Important Message Given:  Yes Signed IM notice given   Katrina Stack, RN 01/23/2018, 9:18 AM

## 2018-01-23 NOTE — Care Management (Signed)
No discharge needs identified by members of the care team. has chronic 02 and will have portable tank for transfer home

## 2018-01-23 NOTE — Consult Note (Signed)
Pharmacy Antibiotic Note  SOPHEAP BASIC is a 62 y.o. female admitted on 01/22/2018 with pneumonia.  Pharmacy has been consulted for levofloxacin dosing.  Plan: Levofloxacin 750mg  IV q 24hr  Height: 5' (152.4 cm) Weight: 122 lb 14.4 oz (55.7 kg) IBW/kg (Calculated) : 45.5  Temp (24hrs), Avg:98.4 F (36.9 C), Min:97.7 F (36.5 C), Max:99.9 F (37.7 C)  Recent Labs  Lab 01/22/18 1210 01/22/18 1224 01/22/18 2201 01/23/18 0451  WBC 28.8*  --   --  22.7*  CREATININE 0.75  --  0.69 0.54  LATICACIDVEN  --  1.7  --   --     Estimated Creatinine Clearance: 59.3 mL/min (by C-G formula based on SCr of 0.54 mg/dL).    No Known Allergies  Antimicrobials this admission: Ceftriaxone/azithro 3/7 >> one dose vanc/zosyn 3/7 >> 3/8 Levofloxacin 3/8>>  Dose adjustments this admission:   Microbiology results: 3/7 BCx: NG <24hr   Thank you for allowing pharmacy to be a part of this patient's care.  Ramond Dial, Pharm.D, BCPS Clinical Pharmacist  01/23/2018 11:02 AM

## 2018-01-23 NOTE — Evaluation (Signed)
Physical Therapy Evaluation Patient Details Name: [name] MRN: [id number] DOB: Sep 07, 1956 Today's Date: 01/23/2018   History of Present Illness  presented to Er secondary to worsening cough, SOB and fever; admitted with acute/chronic respiratory failure and sepsis related to bilat PNA.  Clinical Impression  Upon evaluation, patient alert and oriented; follows all commands and demonstrates good insight/safety awareness.  Bilat UE/LE strength and ROM grossly symmetrical and WFL.  Able to complete bed mobility with mod indep; sit/stand, basic transfers and gait (50') without assist device, cga/close sup.  Slow and guarded due to fatigue, SOB, but no episodes of buckling, LOB or safety concern noted.  Additional distance deferred secondary to SOB and desat to 85% with limited distances (requiring seated rest and pursed lip breathing x30-40 seconds for recovery >90% on 3L). Patient reporting physical ability and overall mobility grossly at baseline for her; limited strictly by respiratory status. Will maintain on caseload throughout remaining hospitalization to maintain/improve cardiopulmonary status and overall activity tolerance; anticipate no skilled PT needs upon discharge.  May benefit from outpatient pulmonary rehab referral.    Follow Up Recommendations No PT follow up(may benefit from outpatient pulmonary rehab referral)    Equipment Recommendations       Recommendations for Other Services       Precautions / Restrictions Precautions Precautions: Fall Restrictions Weight Bearing Restrictions: No      Mobility  Bed Mobility Overal bed mobility: Modified Independent                Transfers Overall transfer level: Needs assistance   Transfers: Sit to/from Stand Sit to Stand: Supervision            Ambulation/Gait Ambulation/Gait assistance: Supervision Ambulation Distance (Feet): 50 Feet Assistive device: None       General Gait Details: guarded gait  performance due to fatigue/SOB, but no overt buckling or LOB noted.  Additional distance limited by SOB with desat to 85%, requiring seated rest and pursed lip breathing x30-40 seconds for recovery >90% 3L.  Stairs            Wheelchair Mobility    Modified Rankin (Stroke Patients Only)       Balance Overall balance assessment: Needs assistance Sitting-balance support: No upper extremity supported;Feet supported Sitting balance-Leahy Scale: Good     Standing balance support: No upper extremity supported Standing balance-Leahy Scale: Fair                               Pertinent Vitals/Pain Pain Assessment: No/denies pain    Home Living Family/patient expects to be discharged to:: Private residence Living Arrangements: Spouse/significant other Available Help at Discharge: Family Type of Home: House Home Access: Stairs to enter Entrance Stairs-Rails: Psychiatric nurse of Steps: 5 Home Layout: One level Home Equipment: None      Prior Function Level of Independence: Independent         Comments: Indep with ADLs, household and community mobilization without assist device; denies fall history; home O2 at 2L continuous     Hand Dominance        Extremity/Trunk Assessment   Upper Extremity Assessment Upper Extremity Assessment: Overall WFL for tasks assessed    Lower Extremity Assessment Lower Extremity Assessment: Overall WFL for tasks assessed       Communication   Communication: No difficulties  Cognition Arousal/Alertness: Awake/alert Behavior During Therapy: WFL for tasks assessed/performed Overall Cognitive Status: Within Functional Limits for  tasks assessed                                        General Comments      Exercises Other Exercises Other Exercises: Educatedin role of activity pacing, energy conservation with functional activities, strategies for incorporating into daily activities;  patient voiced understanding of information provided.   Assessment/Plan    PT Assessment Patient needs continued PT services  PT Problem List Decreased activity tolerance;Decreased balance;Decreased mobility;Cardiopulmonary status limiting activity       PT Treatment Interventions DME instruction;Gait training;Functional mobility training;Stair training;Therapeutic activities;Therapeutic exercise;Patient/family education    PT Goals (Current goals can be found in the Care Plan section)  Acute Rehab PT Goals Patient Stated Goal: to return home when ready PT Goal Formulation: With patient Time For Goal Achievement: 02/06/18 Potential to Achieve Goals: Good Additional Goals Additional Goal #1: Indep with techniques for activity pacing, energy conservation for optimal oxygenation with functional tasks    Frequency Min 2X/week   Barriers to discharge Decreased caregiver support      Co-evaluation               AM-PAC PT "6 Clicks" Daily Activity  Outcome Measure Difficulty turning over in bed (including adjusting bedclothes, sheets and blankets)?: None Difficulty moving from lying on back to sitting on the side of the bed? : None Difficulty sitting down on and standing up from a chair with arms (e.g., wheelchair, bedside commode, etc,.)?: None Help needed moving to and from a bed to chair (including a wheelchair)?: A Little Help needed walking in hospital room?: A Little Help needed climbing 3-5 steps with a railing? : A Little 6 Click Score: 21    End of Session Equipment Utilized During Treatment: Gait belt;Oxygen Activity Tolerance: Patient tolerated treatment well Patient left: in chair;with call bell/phone within reach;with chair alarm set(alarm pad connected to box, box not connected to call bell system (cord not available). RN informed/aware, to locate and apply as able  Patient informed/aware of need for assist with all mobility; agrees not to attempt mobiltiy  without staff assist) Nurse Communication: Mobility status PT Visit Diagnosis: Difficulty in walking, not elsewhere classified (R26.2)    Time: 4128-7867 PT Time Calculation (min) (ACUTE ONLY): 18 min   Charges:   PT Evaluation $PT Eval Low Complexity: 1 Low PT Treatments $Therapeutic Activity: 8-22 mins   PT G Codes:        Delorse Shane H. Owens Shark, PT, DPT, NCS 01/23/18, 4:38 PM [phone]

## 2018-01-23 NOTE — Progress Notes (Signed)
[place] [facility] [place] NAME: Alverda Nazzaro    MR#:  [id number]  DATE OF BIRTH:  Feb 09, 1956  SUBJECTIVE:   Patient continues to have shortness of breath and wheezing  REVIEW OF SYSTEMS:    Review of Systems  Constitutional: Negative for fever, chills weight loss HENT: Negative for ear pain, nosebleeds, congestion, facial swelling, rhinorrhea, neck pain, neck stiffness and ear discharge.   Respiratory:  ++ cough, shortness of breath, wheezing  Cardiovascular: Negative for chest pain, palpitations and leg swelling.  Gastrointestinal: Negative for heartburn, abdominal pain, vomiting, diarrhea or consitpation Genitourinary: Negative for dysuria, urgency, frequency, hematuria Musculoskeletal: Negative for back pain or joint pain Neurological: Negative for dizziness, seizures, syncope, focal weakness,  numbness and headaches.  Hematological: Does not bruise/bleed easily.  Psychiatric/Behavioral: Negative for hallucinations, confusion, dysphoric mood    Tolerating Diet: yes      DRUG ALLERGIES:  No Known Allergies  VITALS:  Blood pressure 130/66, pulse 81, temperature 97.8 F (36.6 C), temperature source Oral, resp. rate 16, height 5' (1.524 m), weight 55.7 kg (122 lb 14.4 oz), SpO2 96 %.  PHYSICAL EXAMINATION:  Constitutional: Appears well-developed and well-nourished. No distress. HENT: Normocephalic. Marland Kitchen Oropharynx is clear and moist.  Eyes: Conjunctivae and EOM are normal. PERRLA, no scleral icterus.  Neck: Normal ROM. Neck supple. No JVD. No tracheal deviation. CVS: RRR, S1/S2 +, no murmurs, no gallops, no carotid bruit.  Pulmonary: Normal respiratory effort with bilateral prolonged expiratory wheezing no rales or rhonchi  Abdominal: Soft. BS +,  no distension, tenderness, rebound or guarding.  Musculoskeletal: Normal range of motion. No edema and no tenderness.  Neuro: Alert. CN 2-12 grossly intact. No focal deficits. Skin: Skin is  warm and dry. No rash noted. Psychiatric: Normal mood and affect.      LABORATORY PANEL:   CBC Recent Labs  Lab 01/23/18 0451  WBC 22.7*  HGB 10.9*  HCT 32.8*  PLT 436   ------------------------------------------------------------------------------------------------------------------  Chemistries  Recent Labs  Lab 01/22/18 1210  01/23/18 0451  NA 135   < > 139  K 2.9*   < > 3.8  CL 95*   < > 105  CO2 28   < > 26  GLUCOSE 100*   < > 100*  BUN 14   < > 11  CREATININE 0.75   < > 0.54  CALCIUM 8.3*   < > 8.1*  MG  --   --  2.2  AST 20  --   --   ALT 8*  --   --   ALKPHOS 68  --   --   BILITOT 0.7  --   --    < > = values in this interval not displayed.   ------------------------------------------------------------------------------------------------------------------  Cardiac Enzymes Recent Labs  Lab 01/22/18 1210  TROPONINI <0.03   ------------------------------------------------------------------------------------------------------------------  RADIOLOGY:  Dg Chest Port 1 View  Result Date: 01/22/2018 CLINICAL DATA:  Short of breath and cough EXAM: PORTABLE CHEST 1 VIEW COMPARISON:  01/19/2016 FINDINGS: COPD with hyperinflation. Bibasilar airspace disease has developed since prior study and may represent pneumonia, right greater than left. Possible small right effusion. No definite heart failure IMPRESSION: COPD.  Bibasilar airspace disease, probable pneumonia Electronically Signed   By: Franchot Gallo M.D.   On: 01/22/2018 12:24     ASSESSMENT AND PLAN:   62 year old female with history of severe COPD/chronic hypoxic respiratory failure on 2 L of oxygen followed by Dr. Raul Del who  presents with shortness of breath.  1. Acute on chronic hypoxic respiratory failure in the setting of pneumonia and COPD exacerbation/chronic bronchiectasis Wean oxygen to 2 L baseline  2. Sepsis: Patient presents with fever, tachycardia Sepsis is due to pneumonia This is  community-acquired pneumonia and therefore I will change antibiotics to Levaquin. Follow-up on final blood cultures Follow CBC Lactic acid is within normal limits  3. Acute exacerbation of COPD: Continue inhalers and nebulizer treatment Continue IV steroids with plan to wean in the next 1-2 days  4. Community-acquired pneumonia: Continue Levaquin  5. Hypokalemia: This is improved with replacement.  Management plans discussed with the patient and she is in agreement.  CODE STATUS: FULL  TOTAL TIME TAKING CARE OF THIS PATIENT: 30 minutes.   Physical therapy consultation for discharge planning  POSSIBLE D/C 2-3 days, DEPENDING ON CLINICAL CONDITION.   Ameila Weldon M.D on 01/23/2018 at 10:44 AM  Between 7am to 6pm - Pager - [phone] After 6pm go to [email or web address] - password [email or web address] [facility]  Office  [phone]  CC: Primary care physician; Center, [facility]  Note: This dictation was prepared with Dragon dictation along with smaller phrase technology. Any transcriptional errors that result from this process are unintentional.

## 2018-01-24 LAB — CBC
HCT: 33.8 % — ABNORMAL LOW (ref 35.0–47.0)
HEMOGLOBIN: 11.3 g/dL — AB (ref 12.0–16.0)
MCH: 28.7 pg (ref 26.0–34.0)
MCHC: 33.3 g/dL (ref 32.0–36.0)
MCV: 86.2 fL (ref 80.0–100.0)
PLATELETS: 483 10*3/uL — AB (ref 150–440)
RBC: 3.92 MIL/uL (ref 3.80–5.20)
RDW: 13.4 % (ref 11.5–14.5)
WBC: 20.3 10*3/uL — AB (ref 3.6–11.0)

## 2018-01-24 LAB — BASIC METABOLIC PANEL
ANION GAP: 10 (ref 5–15)
BUN: 10 mg/dL (ref 6–20)
CALCIUM: 8.9 mg/dL (ref 8.9–10.3)
CO2: 29 mmol/L (ref 22–32)
Chloride: 99 mmol/L — ABNORMAL LOW (ref 101–111)
Creatinine, Ser: 0.57 mg/dL (ref 0.44–1.00)
Glucose, Bld: 129 mg/dL — ABNORMAL HIGH (ref 65–99)
Potassium: 3.8 mmol/L (ref 3.5–5.1)
SODIUM: 138 mmol/L (ref 135–145)

## 2018-01-24 LAB — MAGNESIUM: Magnesium: 2 mg/dL (ref 1.7–2.4)

## 2018-01-24 LAB — GLUCOSE, CAPILLARY: GLUCOSE-CAPILLARY: 107 mg/dL — AB (ref 65–99)

## 2018-01-24 LAB — HIV ANTIBODY (ROUTINE TESTING W REFLEX): HIV SCREEN 4TH GENERATION: NONREACTIVE

## 2018-01-24 MED ORDER — ZOLPIDEM TARTRATE 5 MG PO TABS
5.0000 mg | ORAL_TABLET | Freq: Every evening | ORAL | Status: DC | PRN
  Administered 2018-01-24: 5 mg via ORAL
  Filled 2018-01-24: qty 1

## 2018-01-24 MED ORDER — LEVOFLOXACIN 750 MG PO TABS
750.0000 mg | ORAL_TABLET | Freq: Every day | ORAL | Status: DC
  Administered 2018-01-25 – 2018-01-27 (×3): 750 mg via ORAL
  Filled 2018-01-24 (×3): qty 1

## 2018-01-24 MED ORDER — ALUM & MAG HYDROXIDE-SIMETH 200-200-20 MG/5ML PO SUSP
30.0000 mL | Freq: Four times a day (QID) | ORAL | Status: DC | PRN
  Administered 2018-01-24 – 2018-01-26 (×3): 30 mL via ORAL
  Filled 2018-01-24 (×3): qty 30

## 2018-01-24 MED ORDER — METHYLPREDNISOLONE SODIUM SUCC 125 MG IJ SOLR
60.0000 mg | Freq: Three times a day (TID) | INTRAMUSCULAR | Status: DC
  Administered 2018-01-24 – 2018-01-28 (×12): 60 mg via INTRAVENOUS
  Filled 2018-01-24 (×12): qty 2

## 2018-01-24 MED ORDER — PANTOPRAZOLE SODIUM 40 MG PO TBEC
40.0000 mg | DELAYED_RELEASE_TABLET | Freq: Two times a day (BID) | ORAL | Status: DC
  Administered 2018-01-24 – 2018-01-28 (×8): 40 mg via ORAL
  Filled 2018-01-24 (×8): qty 1

## 2018-01-24 NOTE — Progress Notes (Addendum)
[place] at [place] NAME: [name]    MR#:  [id number]  DATE OF BIRTH:  20-Feb-1956  SUBJECTIVE:  CHIEF COMPLAINT:   Chief Complaint  Patient presents with  . Fever  . Shortness of Breath   -Still complains of significant dyspnea with minimal exertion -Remains on 4 L oxygen.  REVIEW OF SYSTEMS:  Review of Systems  Constitutional: Negative for chills, fever and malaise/fatigue.  HENT: Negative for congestion, ear discharge, hearing loss and nosebleeds.   Eyes: Negative for blurred vision.  Respiratory: Positive for shortness of breath and wheezing. Negative for cough.   Cardiovascular: Negative for chest pain, palpitations and leg swelling.  Gastrointestinal: Positive for heartburn. Negative for abdominal pain, constipation, diarrhea, nausea and vomiting.  Genitourinary: Negative for dysuria.  Musculoskeletal: Negative for myalgias.  Neurological: Negative for dizziness, speech change, focal weakness, seizures and headaches.  Psychiatric/Behavioral: Negative for depression.    DRUG ALLERGIES:  No Known Allergies  VITALS:  Blood pressure (!) 152/80, pulse 91, temperature 98 F (36.7 C), temperature source Oral, resp. rate 16, height 5' (1.524 m), weight 55.4 kg (122 lb 3.2 oz), SpO2 97 %.  PHYSICAL EXAMINATION:  Physical Exam  GENERAL:  62 y.o.-year-old patient lying in the bed with no acute distress.  EYES: Pupils equal, round, reactive to light and accommodation. No scleral icterus. Extraocular muscles intact.  HEENT: Head atraumatic, normocephalic. Oropharynx and nasopharynx clear.  NECK:  Supple, no jugular venous distention. No thyroid enlargement, no tenderness.  LUNGS: Fairly tight on auscultation at rest with scattered wheezing, no rales,rhonchi or crepitation. No use of accessory muscles of respiration.  CARDIOVASCULAR: S1, S2 normal. No murmurs, rubs, or gallops.  ABDOMEN: Soft, nontender, nondistended. Bowel  sounds present. No organomegaly or mass.  EXTREMITIES: No pedal edema, cyanosis, or clubbing.  NEUROLOGIC: Cranial nerves II through XII are intact. Muscle strength 5/5 in all extremities. Sensation intact. Gait not checked.  PSYCHIATRIC: The patient is alert and oriented x 3.  SKIN: No obvious rash, lesion, or ulcer.    LABORATORY PANEL:   CBC Recent Labs  Lab 01/24/18 0435  WBC 20.3*  HGB 11.3*  HCT 33.8*  PLT 483*   ------------------------------------------------------------------------------------------------------------------  Chemistries  Recent Labs  Lab 01/22/18 1210  01/24/18 0435  NA 135   < > 138  K 2.9*   < > 3.8  CL 95*   < > 99*  CO2 28   < > 29  GLUCOSE 100*   < > 129*  BUN 14   < > 10  CREATININE 0.75   < > 0.57  CALCIUM 8.3*   < > 8.9  MG  --    < > 2.0  AST 20  --   --   ALT 8*  --   --   ALKPHOS 68  --   --   BILITOT 0.7  --   --    < > = values in this interval not displayed.   ------------------------------------------------------------------------------------------------------------------  Cardiac Enzymes Recent Labs  Lab 01/22/18 1210  TROPONINI <0.03   ------------------------------------------------------------------------------------------------------------------  RADIOLOGY:  Dg Chest Port 1 View  Result Date: 01/22/2018 CLINICAL DATA:  Short of breath and cough EXAM: PORTABLE CHEST 1 VIEW COMPARISON:  01/19/2016 FINDINGS: COPD with hyperinflation. Bibasilar airspace disease has developed since prior study and may represent pneumonia, right greater than left. Possible small right effusion. No definite heart failure IMPRESSION: COPD.  Bibasilar airspace disease, probable pneumonia Electronically Signed  By: Franchot Gallo M.D.   On: 01/22/2018 12:24    EKG:   Orders placed or performed during the hospital encounter of 01/22/18  . EKG 12-Lead  . EKG 12-Lead  . EKG 12-Lead  . EKG 12-Lead  . EKG 12-Lead  . EKG 12-Lead     ASSESSMENT AND PLAN:   62 year old female with known history of COPD on 2 L home oxygen, hypertension presents to hospital secondary to fever, worsening shortness of breath  1. Acute on chronic respiratory failure-secondary to acute on chronic COPD exacerbation- -Continue IV steroids-increased the dose as patient is fairly tight on exam -Continue nebulizers and inhalers. -On 4 L oxygen-wean to 2 L as tolerated. -Continue Levaquin, changed to oral  2. Complaints of significant heartburn symptoms-start Maalox and increase Protonix dose  3. Hypertension-stable on Norvasc  4. Hyperlipidemia-on statin  5. DVT prophylaxis-Lovenox     All the records are reviewed and case discussed with Care Management/Social Workerr. Management plans discussed with the patient, family and they are in agreement.  CODE STATUS: Full code  TOTAL TIME TAKING CARE OF THIS PATIENT: 38 minutes.   POSSIBLE D/C IN 1-2 DAYS, DEPENDING ON CLINICAL CONDITION.   Gladstone Lighter M.D on 01/24/2018 at 9:33 AM  Between 7am to 6pm - Pager - [phone]  After 6pm go to [email or web address] - password [email or web address] [facility]  Office  [phone]  CC: Primary care physician; Center, [facility]

## 2018-01-24 NOTE — Progress Notes (Signed)
Electrolyte replacement  3/7 2200  BMP Corrected calcium 8.3. CaCO3 500 mg TID x 3 doses ordered. Recheck BMP in AM.  03/08 AM electrolyte panel WNL except calcium still low. Replacement CaCO3 as above. Recheck with tomorrow AM labs.  3/9 Labs WNL.No replacement needed. Will recheck 3/11.   Chinita Greenland PharmD Clinical Pharmacist 01/24/2018

## 2018-01-25 LAB — GLUCOSE, CAPILLARY: Glucose-Capillary: 115 mg/dL — ABNORMAL HIGH (ref 65–99)

## 2018-01-25 LAB — MAGNESIUM: Magnesium: 2.6 mg/dL — ABNORMAL HIGH (ref 1.7–2.4)

## 2018-01-25 MED ORDER — SODIUM CHLORIDE 0.9% FLUSH
3.0000 mL | Freq: Two times a day (BID) | INTRAVENOUS | Status: DC
  Administered 2018-01-25 – 2018-01-28 (×6): 3 mL via INTRAVENOUS

## 2018-01-25 NOTE — Plan of Care (Signed)
Patient is progressing in all areas. 

## 2018-01-25 NOTE — Plan of Care (Addendum)
Patient was only able to ambulate to the room door and patient desated to 84% on 2 liters, pt became short of breath, patient placed back on 4L Spo2 90 %  Progressing Spiritual Needs Ability to function at adequate level 01/25/2018 1512 - Progressing by Rolley Sims, RN Education: Knowledge of General Education information will improve 01/25/2018 1512 - Progressing by Rolley Sims, RN Health Behavior/Discharge Planning: Ability to manage health-related needs will improve 01/25/2018 1512 - Progressing by Rolley Sims, RN

## 2018-01-25 NOTE — Progress Notes (Signed)
[place] at [place] NAME: [name]    MR#:  [id number]  DATE OF BIRTH:  1956-01-23  SUBJECTIVE:  CHIEF COMPLAINT:   Chief Complaint  Patient presents with  . Fever  . Shortness of Breath   -Breathing is slightly improved today. Still on 4 L oxygen. At home she uses only 2 L  REVIEW OF SYSTEMS:  Review of Systems  Constitutional: Negative for chills, fever and malaise/fatigue.  HENT: Negative for congestion, ear discharge, hearing loss and nosebleeds.   Eyes: Negative for blurred vision.  Respiratory: Positive for shortness of breath and wheezing. Negative for cough.   Cardiovascular: Negative for chest pain, palpitations and leg swelling.  Gastrointestinal: Positive for heartburn. Negative for abdominal pain, constipation, diarrhea, nausea and vomiting.  Genitourinary: Negative for dysuria.  Musculoskeletal: Negative for myalgias.  Neurological: Negative for dizziness, speech change, focal weakness, seizures and headaches.  Psychiatric/Behavioral: Negative for depression.    DRUG ALLERGIES:  No Known Allergies  VITALS:  Blood pressure 126/70, pulse 98, temperature 97.8 F (36.6 C), temperature source Oral, resp. rate 16, height 5' (1.524 m), weight 54.2 kg (119 lb 8 oz), SpO2 90 %.  PHYSICAL EXAMINATION:  Physical Exam  GENERAL:  62 y.o.-year-old patient lying in the bed with no acute distress.  EYES: Pupils equal, round, reactive to light and accommodation. No scleral icterus. Extraocular muscles intact.  HEENT: Head atraumatic, normocephalic. Oropharynx and nasopharynx clear.  NECK:  Supple, no jugular venous distention. No thyroid enlargement, no tenderness.  LUNGS: Still tight and auscultation but could hear air entry today. Has scattered wheezing. no rales,rhonchi or crepitation. No use of accessory muscles of respiration.  CARDIOVASCULAR: S1, S2 normal. No murmurs, rubs, or gallops.  ABDOMEN: Soft, nontender,  nondistended. Bowel sounds present. No organomegaly or mass.  EXTREMITIES: No pedal edema, cyanosis, or clubbing.  NEUROLOGIC: Cranial nerves II through XII are intact. Muscle strength 5/5 in all extremities. Sensation intact. Gait not checked.  PSYCHIATRIC: The patient is alert and oriented x 3.  SKIN: No obvious rash, lesion, or ulcer.    LABORATORY PANEL:   CBC Recent Labs  Lab 01/24/18 0435  WBC 20.3*  HGB 11.3*  HCT 33.8*  PLT 483*   ------------------------------------------------------------------------------------------------------------------  Chemistries  Recent Labs  Lab 01/22/18 1210  01/24/18 0435 01/25/18 0451  NA 135   < > 138  --   K 2.9*   < > 3.8  --   CL 95*   < > 99*  --   CO2 28   < > 29  --   GLUCOSE 100*   < > 129*  --   BUN 14   < > 10  --   CREATININE 0.75   < > 0.57  --   CALCIUM 8.3*   < > 8.9  --   MG  --    < > 2.0 2.6*  AST 20  --   --   --   ALT 8*  --   --   --   ALKPHOS 68  --   --   --   BILITOT 0.7  --   --   --    < > = values in this interval not displayed.   ------------------------------------------------------------------------------------------------------------------  Cardiac Enzymes Recent Labs  Lab 01/22/18 1210  TROPONINI <0.03   ------------------------------------------------------------------------------------------------------------------  RADIOLOGY:  No results found.  EKG:   Orders placed or performed during the hospital encounter of 01/22/18  .  EKG 12-Lead  . EKG 12-Lead  . EKG 12-Lead  . EKG 12-Lead  . EKG 12-Lead  . EKG 12-Lead    ASSESSMENT AND PLAN:   62 year old female with known history of COPD on 2 L home oxygen, hypertension presents to hospital secondary to fever, worsening shortness of breath  1. Acute on chronic respiratory failure-secondary to acute on chronic COPD exacerbation- -Continue IV steroids-minimal improvement today -Continue nebulizers and inhalers. -On 4 L oxygen-wean to  2 L as tolerated. -Continue Levaquin   2. Complaints of significant heartburn symptoms-on Maalox and Protonix   3. Hypertension-stable on Norvasc  4. Hyperlipidemia-on statin  5. DVT prophylaxis-Lovenox  Encourage ambulation and continue to wean oxygen    All the records are reviewed and case discussed with Care Management/Social Workerr. Management plans discussed with the patient, family and they are in agreement.  CODE STATUS: Full code  TOTAL TIME TAKING CARE OF THIS PATIENT: 36 minutes.   POSSIBLE D/C IN 1-2 DAYS, DEPENDING ON CLINICAL CONDITION.   Gladstone Lighter M.D on 01/25/2018 at 9:20 AM  Between 7am to 6pm - Pager - [phone]  After 6pm go to [email or web address] - password [email or web address] [place] [facility]  Office  [phone]  CC: Primary care physician; Center, [facility]

## 2018-01-26 ENCOUNTER — Inpatient Hospital Stay: Payer: Medicare HMO

## 2018-01-26 LAB — BASIC METABOLIC PANEL
Anion gap: 9 (ref 5–15)
BUN: 15 mg/dL (ref 6–20)
CALCIUM: 8.4 mg/dL — AB (ref 8.9–10.3)
CO2: 29 mmol/L (ref 22–32)
Chloride: 96 mmol/L — ABNORMAL LOW (ref 101–111)
Creatinine, Ser: 0.74 mg/dL (ref 0.44–1.00)
GFR calc Af Amer: >60 mL/min (ref >60)
GFR calc non Af Amer: >60 mL/min (ref >60)
GLUCOSE: 118 mg/dL — AB (ref 65–99)
Potassium: 4.1 mmol/L (ref 3.5–5.1)
Sodium: 134 mmol/L — ABNORMAL LOW (ref 135–145)

## 2018-01-26 LAB — GLUCOSE, CAPILLARY: GLUCOSE-CAPILLARY: 111 mg/dL — AB (ref 65–99)

## 2018-01-26 LAB — MAGNESIUM: MAGNESIUM: 2.4 mg/dL (ref 1.7–2.4)

## 2018-01-26 NOTE — Treatment Plan (Signed)
Pt established with Dr. Raul Del, changed consult to Dr. Raul Del.  -DR.

## 2018-01-26 NOTE — Consult Note (Signed)
Pharmacy Antibiotic Note  [name] is a 62 y.o. female admitted on 01/22/2018 with pneumonia.  Pharmacy has been consulted for levofloxacin dosing.  Plan: Continue Levofloxacin 750 mg PO q 24hr  Height: 5' (152.4 cm) Weight: 118 lb 8 oz (53.8 kg) IBW/kg (Calculated) : 45.5  Temp (24hrs), Avg:97.7 F (36.5 C), Min:97.5 F (36.4 C), Max:98 F (36.7 C)  Recent Labs  Lab 01/22/18 1210 01/22/18 1224 01/22/18 2201 01/23/18 0451 01/24/18 0435 01/26/18 0540  WBC 28.8*  --   --  22.7* 20.3*  --   CREATININE 0.75  --  0.69 0.54 0.57 0.74  LATICACIDVEN  --  1.7  --   --   --   --     Estimated Creatinine Clearance: 54.4 mL/min (by C-G formula based on SCr of 0.74 mg/dL).    No Known Allergies  Antimicrobials this admission: Ceftriaxone/azithro 3/7 >> one dose each vanc/zosyn 3/7 >> 3/8 Levofloxacin 3/8>>  Dose adjustments this admission:   Microbiology results: 3/7 BCx: NGTD   Thank you for allowing pharmacy to be a part of this patient's care.  Rayna Sexton, PharmD, BCPS Clinical Pharmacist 01/26/2018 11:38 AM

## 2018-01-26 NOTE — Progress Notes (Signed)
[place] at [place] NAME: [name]    MR#:  [id number]  DATE OF BIRTH:  [birth year]  SUBJECTIVE:  CHIEF COMPLAINT:   Chief Complaint  Patient presents with  . Fever  . Shortness of Breath   - still very tight and auscultation. Remains on 4-5 L oxygen and hypoxic with minimal movement. -Pulmonary consult today  REVIEW OF SYSTEMS:  Review of Systems  Constitutional: Negative for chills, fever and malaise/fatigue.  HENT: Negative for congestion, ear discharge, hearing loss and nosebleeds.   Eyes: Negative for blurred vision.  Respiratory: Positive for shortness of breath and wheezing. Negative for cough.   Cardiovascular: Negative for chest pain, palpitations and leg swelling.  Gastrointestinal: Positive for heartburn. Negative for abdominal pain, constipation, diarrhea, nausea and vomiting.  Genitourinary: Negative for dysuria.  Musculoskeletal: Negative for myalgias.  Neurological: Negative for dizziness, speech change, focal weakness, seizures and headaches.  Psychiatric/Behavioral: Negative for depression.    DRUG ALLERGIES:  No Known Allergies  VITALS:  Blood pressure 113/71, pulse 94, temperature (!) 97.5 F (36.4 C), temperature source Oral, resp. rate 16, height 5' (1.524 m), weight 53.8 kg (118 lb 8 oz), SpO2 93 %.  PHYSICAL EXAMINATION:  Physical Exam  GENERAL:  62 y.o.-year-old patient lying in the bed with no acute distress.  EYES: Pupils equal, round, reactive to light and accommodation. No scleral icterus. Extraocular muscles intact.  HEENT: Head atraumatic, normocephalic. Oropharynx and nasopharynx clear.  NECK:  Supple, no jugular venous distention. No thyroid enlargement, no tenderness.  LUNGS: Still tight on auscultation with minimal air entry today. Has scattered wheezing. no rales,rhonchi or crepitation. No use of accessory muscles of respiration.  CARDIOVASCULAR: S1, S2 normal. No murmurs, rubs, or  gallops.  ABDOMEN: Soft, nontender, nondistended. Bowel sounds present. No organomegaly or mass.  EXTREMITIES: No pedal edema, cyanosis, or clubbing.  NEUROLOGIC: Cranial nerves II through XII are intact. Muscle strength 5/5 in all extremities. Sensation intact. Gait not checked.  PSYCHIATRIC: The patient is alert and oriented x 3.  SKIN: No obvious rash, lesion, or ulcer.    LABORATORY PANEL:   CBC Recent Labs  Lab 01/24/18 0435  WBC 20.3*  HGB 11.3*  HCT 33.8*  PLT 483*   ------------------------------------------------------------------------------------------------------------------  Chemistries  Recent Labs  Lab 01/22/18 1210  01/26/18 0540  NA 135   < > 134*  K 2.9*   < > 4.1  CL 95*   < > 96*  CO2 28   < > 29  GLUCOSE 100*   < > 118*  BUN 14   < > 15  CREATININE 0.75   < > 0.74  CALCIUM 8.3*   < > 8.4*  MG  --    < > 2.4  AST 20  --   --   ALT 8*  --   --   ALKPHOS 68  --   --   BILITOT 0.7  --   --    < > = values in this interval not displayed.   ------------------------------------------------------------------------------------------------------------------  Cardiac Enzymes Recent Labs  Lab 01/22/18 1210  TROPONINI <0.03   ------------------------------------------------------------------------------------------------------------------  RADIOLOGY:  Dg Chest 2 View  Result Date: 01/26/2018 CLINICAL DATA:  Dyspnea.  COPD. EXAM: CHEST - 2 VIEW COMPARISON:  01/22/2018 and 01/19/2016 FINDINGS: The heart size and vascularity remain normal. The patient has chronic emphysematous changes in the upper lobes and chronic interstitial disease at the bases. There has been partial clearing of  the superimposed infiltrates at both bases since the prior exam. No effusions. Aortic atherosclerosis.  No acute bone abnormality. IMPRESSION: 1. Partial clearing of the faint infiltrates at the right and left lung bases. 2. Aortic Atherosclerosis (ICD10-I70.0) and Emphysema  (ICD10-J43.9). Electronically Signed   By: Lorriane Shire M.D.   On: 01/26/2018 11:07    EKG:   Orders placed or performed during the hospital encounter of 01/22/18  . EKG 12-Lead  . EKG 12-Lead  . EKG 12-Lead  . EKG 12-Lead  . EKG 12-Lead  . EKG 12-Lead    ASSESSMENT AND PLAN:   62 year old female with known history of COPD on 2 L home oxygen, hypertension presents to hospital secondary to fever, worsening shortness of breath  1. Acute on chronic respiratory failure-secondary to acute on chronic COPD exacerbation- -still very tight on auscultation and still needing 4-5 L oxygen. -At home she is chronically on 2 L only. -Continue IV steroids today -Repeat chest x-ray and pulmonary consult -Continue nebulizers and inhalers. -Continue Levaquin - for 7 days.  2. Complaints of significant heartburn symptoms-on Maalox and Protonix   3. Hypertension-stable on Norvasc  4. Hyperlipidemia-on statin  5. DVT prophylaxis-Lovenox  Encourage ambulation and continue to wean oxygen Pulmonary consult today    All the records are reviewed and case discussed with Care Management/Social Workerr. Management plans discussed with the patient, family and they are in agreement.  CODE STATUS: Full code  TOTAL TIME TAKING CARE OF THIS PATIENT: 36 minutes.   POSSIBLE D/C IN 1-2 DAYS, DEPENDING ON CLINICAL CONDITION.   Gladstone Lighter M.D on 01/26/2018 at 1:00 PM  Between 7am to 6pm - Pager - [phone]  After 6pm go to [email or web address] - password [email or web address] [facility]  Office  [phone]  CC: Primary care physician; Center, [facility]

## 2018-01-26 NOTE — Progress Notes (Signed)
Electrolyte replacement  3/7 2200  BMP Corrected calcium 8.3. CaCO3 500 mg TID x 3 doses ordered. Recheck BMP in AM.  03/08 AM electrolyte panel WNL except calcium still low. Replacement CaCO3 as above. Recheck with tomorrow AM labs.  3/9 Labs WNL.No replacement needed. Will recheck 3/11.  3/11 K 4.1, Mag 2.4, CorrCa 8.9.  No replacement needed. Labs relatively stable. Will sign off and follow peripherally.   Rayna Sexton, PharmD, BCPS Clinical Pharmacist 01/26/2018 11:32 AM

## 2018-01-27 LAB — CULTURE, BLOOD (ROUTINE X 2)
CULTURE: NO GROWTH
CULTURE: NO GROWTH
SPECIAL REQUESTS: ADEQUATE
Special Requests: ADEQUATE

## 2018-01-27 LAB — GLUCOSE, CAPILLARY: GLUCOSE-CAPILLARY: 94 mg/dL (ref 65–99)

## 2018-01-27 LAB — MAGNESIUM: Magnesium: 2.3 mg/dL (ref 1.7–2.4)

## 2018-01-27 NOTE — Plan of Care (Signed)
  Progressing Clinical Measurements: Respiratory complications will improve 01/27/2018 2318 - Progressing by Loran Senters, RN Respiratory: Ability to maintain a clear airway will improve 01/27/2018 2318 - Progressing by Loran Senters, RN Levels of oxygenation will improve 01/27/2018 2318 - Progressing by Loran Senters, RN Ability to maintain adequate ventilation will improve 01/27/2018 2318 - Progressing by Loran Senters, RN

## 2018-01-27 NOTE — Plan of Care (Signed)
Patient continues to complains of dyspnea with exertion. Pulmonary consult has been requested.

## 2018-01-27 NOTE — Care Management (Signed)
Barrier- oxygen requirements well above patient's baseline. Pulmonary consult.  IV steroids q8h

## 2018-01-27 NOTE — Progress Notes (Signed)
[place] at [place] NAME: [name]    MR#:  [id number]  DATE OF BIRTH:  [birth year]  SUBJECTIVE:  CHIEF COMPLAINT:   Chief Complaint  Patient presents with  . Fever  . Shortness of Breath   - minimally better today, hypoxic last night and on 4-5L o2 - able to get out of bed with dyspnea - pulmonary consult pending  REVIEW OF SYSTEMS:  Review of Systems  Constitutional: Negative for chills, fever and malaise/fatigue.  HENT: Negative for congestion, ear discharge, hearing loss and nosebleeds.   Eyes: Negative for blurred vision.  Respiratory: Positive for shortness of breath and wheezing. Negative for cough.   Cardiovascular: Negative for chest pain, palpitations and leg swelling.  Gastrointestinal: Positive for heartburn. Negative for abdominal pain, constipation, diarrhea, nausea and vomiting.  Genitourinary: Negative for dysuria.  Musculoskeletal: Negative for myalgias.  Neurological: Negative for dizziness, speech change, focal weakness, seizures and headaches.  Psychiatric/Behavioral: Negative for depression.    DRUG ALLERGIES:  No Known Allergies  VITALS:  Blood pressure 117/61, pulse 96, temperature 98.5 F (36.9 C), temperature source Oral, resp. rate 18, height 5' (1.524 m), weight 53.8 kg (118 lb 8 oz), SpO2 99 %.  PHYSICAL EXAMINATION:  Physical Exam  GENERAL:  62 y.o.-year-old patient lying in the bed with no acute distress.  EYES: Pupils equal, round, reactive to light and accommodation. No scleral icterus. Extraocular muscles intact.  HEENT: Head atraumatic, normocephalic. Oropharynx and nasopharynx clear.  NECK:  Supple, no jugular venous distention. No thyroid enlargement, no tenderness.  LUNGS: Still tight on auscultation with minimal air entry. Has scattered wheezing. no rales,rhonchi or crepitation. No use of accessory muscles of respiration.  CARDIOVASCULAR: S1, S2 normal. No murmurs, rubs, or gallops.    ABDOMEN: Soft, nontender, nondistended. Bowel sounds present. No organomegaly or mass.  EXTREMITIES: No pedal edema, cyanosis, or clubbing.  NEUROLOGIC: Cranial nerves II through XII are intact. Muscle strength 5/5 in all extremities. Sensation intact. Gait not checked.  PSYCHIATRIC: The patient is alert and oriented x 3.  SKIN: No obvious rash, lesion, or ulcer.    LABORATORY PANEL:   CBC Recent Labs  Lab 01/24/18 0435  WBC 20.3*  HGB 11.3*  HCT 33.8*  PLT 483*   ------------------------------------------------------------------------------------------------------------------  Chemistries  Recent Labs  Lab 01/22/18 1210  01/26/18 0540 01/27/18 0418  NA 135   < > 134*  --   K 2.9*   < > 4.1  --   CL 95*   < > 96*  --   CO2 28   < > 29  --   GLUCOSE 100*   < > 118*  --   BUN 14   < > 15  --   CREATININE 0.75   < > 0.74  --   CALCIUM 8.3*   < > 8.4*  --   MG  --    < > 2.4 2.3  AST 20  --   --   --   ALT 8*  --   --   --   ALKPHOS 68  --   --   --   BILITOT 0.7  --   --   --    < > = values in this interval not displayed.   ------------------------------------------------------------------------------------------------------------------  Cardiac Enzymes Recent Labs  Lab 01/22/18 1210  TROPONINI <0.03   ------------------------------------------------------------------------------------------------------------------  RADIOLOGY:  Dg Chest 2 View  Result Date: 01/26/2018 CLINICAL DATA:  Dyspnea.  COPD. EXAM: CHEST - 2 VIEW COMPARISON:  01/22/2018 and 01/19/2016 FINDINGS: The heart size and vascularity remain normal. The patient has chronic emphysematous changes in the upper lobes and chronic interstitial disease at the bases. There has been partial clearing of the superimposed infiltrates at both bases since the prior exam. No effusions. Aortic atherosclerosis.  No acute bone abnormality. IMPRESSION: 1. Partial clearing of the faint infiltrates at the right and left  lung bases. 2. Aortic Atherosclerosis (ICD10-I70.0) and Emphysema (ICD10-J43.9). Electronically Signed   By: Lorriane Shire M.D.   On: 01/26/2018 11:07    EKG:   Orders placed or performed during the hospital encounter of 01/22/18  . EKG 12-Lead  . EKG 12-Lead  . EKG 12-Lead  . EKG 12-Lead  . EKG 12-Lead  . EKG 12-Lead    ASSESSMENT AND PLAN:   62 year old female with known history of COPD on 2 L home oxygen, hypertension presents to hospital secondary to fever, worsening shortness of breath  1. Acute on chronic respiratory failure-secondary to acute on chronic COPD exacerbation- -improving now on auscultation and still needing 4-5 L oxygen. -At home she is chronically on 2 L only. -Continue IV steroids -Repeat chest x-ray with improvement and pulmonary consult is pending -Continue nebulizers and inhalers. -Continue Levaquin - for 7 days. - Incentive spirometry  2. Complaints of significant heartburn symptoms-on Maalox and Protonix   3. Hypertension-stable on Norvasc  4. Hyperlipidemia-on statin  5. DVT prophylaxis-Lovenox  Encourage ambulation and continue to wean oxygen Pulmonary consulted    All the records are reviewed and case discussed with Care Management/Social Workerr. Management plans discussed with the patient, family and they are in agreement.  CODE STATUS: Full code  TOTAL TIME TAKING CARE OF THIS PATIENT: 36 minutes.   POSSIBLE D/C TOMORROW, DEPENDING ON CLINICAL CONDITION.   Gladstone Lighter M.D on 01/27/2018 at 11:10 AM  Between 7am to 6pm - Pager - [phone]  After 6pm go to [email or web address] - password [email or web address] [place]  Office  [phone]  CC: Primary care physician; Center, [facility]

## 2018-01-27 NOTE — Progress Notes (Signed)
Physical Therapy Treatment Patient Details Name: [name] MRN: [id number] DOB: December 15, 1955 Today's Date: 01/27/2018    History of Present Illness presented to Er secondary to worsening cough, SOB and fever; admitted with acute/chronic respiratory failure and sepsis related to bilat PNA.    PT Comments    Pt agreeable to PT; denies pain. Pt initial O2 saturation on 4 L O2 90%. Pt able to demonstrate STS transfers and mobility to from [facility] for self toileting with supervision safely. Pt progressing ambulation distance although speed subjectively much lower than baseline; currently 2 ft/sec, which is below baseline for age. Pt notes quick fatigue/weakness with ambulation requiring stand rest break post 70 ft and occasional light touch along rail. Pt able to continue back an additional 70 ft; O2 sats with mild SOB were 88-90% throughout ambulation. Pt up in chair post session. Continue PT to progress ambulation quality with safe O2 saturation levels to allow for an optimal, safe return home.   Follow Up Recommendations  No PT follow up     Equipment Recommendations       Recommendations for Other Services       Precautions / Restrictions Restrictions Weight Bearing Restrictions: No    Mobility  Bed Mobility Overal bed mobility: Independent                Transfers Overall transfer level: Independent Equipment used: None Transfers: Sit to/from [employer] transfer comment: stands without issue without AD or need to hold onto/reach for support  Ambulation/Gait Ambulation/Gait assistance: Min guard Ambulation Distance (Feet): 140 Feet(70 ft with stand rest break then add'l 70) Assistive device: None Gait Pattern/deviations: Step-through pattern(Pt does reach for light support of rail due to fatigue) Gait velocity: reduced Gait velocity interpretation: Below normal speed for age/gender(2 ft/sec) General Gait Details: slow gait due to fatigue and mild SOB;  O2 sats between 88 and 90% on 4 L   Stairs            Wheelchair Mobility    Modified Rankin (Stroke Patients Only)       Balance Overall balance assessment: Needs assistance Sitting-balance support: No upper extremity supported;Feet supported Sitting balance-Leahy Scale: Good     Standing balance support: No upper extremity supported Standing balance-Leahy Scale: Fair                              Cognition Arousal/Alertness: Awake/alert Behavior During Therapy: WFL for tasks assessed/performed Overall Cognitive Status: Within Functional Limits for tasks assessed                                        Exercises Other Exercises Other Exercises: Incentive spirometer use; encouraged 10/hour Other Exercises: Supervision of STS transfers and bed to [facility] and back     General Comments        Pertinent Vitals/Pain Pain Assessment: No/denies pain    Home Living                      Prior Function            PT Goals (current goals can now be found in the care plan section) Progress towards PT goals: Progressing toward goals    Frequency    Min 2X/week  PT Plan Current plan remains appropriate    Co-evaluation              AM-PAC PT "6 Clicks" Daily Activity  Outcome Measure  Difficulty turning over in bed (including adjusting bedclothes, sheets and blankets)?: None Difficulty moving from lying on back to sitting on the side of the bed? : None Difficulty sitting down on and standing up from a chair with arms (e.g., wheelchair, bedside commode, etc,.)?: None Help needed moving to and from a bed to chair (including a wheelchair)?: None Help needed walking in hospital room?: None Help needed climbing 3-5 steps with a railing? : A Little 6 Click Score: 23    End of Session Equipment Utilized During Treatment: Gait belt;Oxygen Activity Tolerance: Patient tolerated treatment well;Other (comment)(SOB; higher  O2 demands) Patient left: in chair;Other (comment)(low fall risk; states nsg gave [facility] to toilet transfer I)   PT Visit Diagnosis: Difficulty in walking, not elsewhere classified (R26.2)     Time: 1531-1600 PT Time Calculation (min) (ACUTE ONLY): 29 min  Charges:  $Gait Training: 8-22 mins $Therapeutic Activity: 8-22 mins                    G CodesLarae Grooms, PTA 01/27/2018, 4:41 PM

## 2018-01-28 LAB — GLUCOSE, CAPILLARY: GLUCOSE-CAPILLARY: 107 mg/dL — AB (ref 65–99)

## 2018-01-28 MED ORDER — PANTOPRAZOLE SODIUM 40 MG PO TBEC: 40.0000 mg | DELAYED_RELEASE_TABLET | Freq: Every day | ORAL | 2 refills | Status: DC

## 2018-01-28 MED ORDER — PREDNISONE 10 MG PO TABS: 10.0000 mg | ORAL_TABLET | Freq: Every day | ORAL | 0 refills | Status: DC

## 2018-01-28 NOTE — Care Management (Signed)
Patient for discharge home and is agreeable for home health nurse and physical therapy.  She verbalizes understanding that her portable oxygen tank will be needed for transport home.  No agency preference for home health.  Referral to [facility] and obtained orders for COPD protocol. Patient will be served by [employer] office

## 2018-01-28 NOTE — Care Management Important Message (Signed)
Important Message  Patient Details  Name: [name] MRN: [id number] Date of Birth: 1956-04-21   Medicare Important Message Given:  Yes Signed IM notice given    Katrina Stack, RN 01/28/2018, 9:02 AM

## 2018-01-28 NOTE — Discharge Summary (Signed)
[facility] [place] NAME: [name]    MR#:  [id number]  DATE OF BIRTH:  1955/12/03  DATE OF ADMISSION:  01/22/2018   ADMITTING PHYSICIAN: Epifanio Lesches, MD  DATE OF DISCHARGE: 01/28/18  PRIMARY CARE PHYSICIAN: [facility], [facility]   ADMISSION DIAGNOSIS:   Acute respiratory failure with hypoxia ([place]) [J96.01] Sepsis, due to unspecified organism ([place]) [A41.9] Pneumonia of both lungs due to infectious organism, unspecified part of lung [J18.9] Acute on chronic respiratory failure ([place]) [J96.20]  DISCHARGE DIAGNOSIS:   Active Problems:   Acute on chronic respiratory failure ([place])   SECONDARY DIAGNOSIS:   Past Medical History:  Diagnosis Date  . COPD (chronic obstructive pulmonary disease) ([place])   . High cholesterol   . Hypertension     HOSPITAL COURSE:   62 year old female with known history of COPD on 2 L home oxygen, hypertension presents to hospital secondary to fever, worsening shortness of breath  1. Acute on chronic respiratory failure-secondary to acute on chronic COPD exacerbation- -improving  on auscultation and still needing 4 L oxygen. -At home she was chronically on 2 L only.she has been doing well on 4 L, ambulated with physical therapy on 4 L. Sats are okay as long as they're greater than 88 and above -received IV steroids in the hospital, discharged on a prolonged prednisone taper -Repeat chest x-ray with improvement and pulmonary consult as outpatient. -Continue nebulizers and inhalers. -finished Levaquin for 7 days in the hospital.. - Incentive spirometry  2. Complaints of significant heartburn symptoms-on Protonix   3. Hypertension-stable on Norvasc  4. Hyperlipidemia-on statin  Encourage ambulation - ambulated well with physical therapy. Recommend home health at discharge    DISCHARGE CONDITIONS:   Guarded  CONSULTS OBTAINED:   Treatment Team:  Erby Pian,  MD  DRUG ALLERGIES:   No Known Allergies DISCHARGE MEDICATIONS:   Allergies as of 01/28/2018   No Known Allergies     Medication List    STOP taking these medications   doxycycline 100 MG tablet Commonly known as:  VIBRA-TABS   gabapentin 300 MG capsule Commonly known as:  NEURONTIN   guaiFENesin 600 MG 12 hr tablet Commonly known as:  MUCINEX   ibuprofen 400 MG tablet Commonly known as:  ADVIL,MOTRIN   ipratropium 17 MCG/ACT inhaler Commonly known as:  ATROVENT HFA   levalbuterol 1.25 MG/3ML nebulizer solution Commonly known as:  XOPENEX   ondansetron 8 MG tablet Commonly known as:  ZOFRAN   OXYGEN   potassium chloride SA 20 MEQ tablet Commonly known as:  K-DUR,KLOR-CON     TAKE these medications   albuterol 108 (90 Base) MCG/ACT inhaler Commonly known as:  PROVENTIL HFA;VENTOLIN HFA Inhale 4-6 puffs into the lungs every 6 (six) hours as needed for wheezing or shortness of breath.   amLODipine 5 MG tablet Commonly known as:  NORVASC Take 5 mg by mouth at bedtime.   aspirin EC 81 MG tablet Take 81 mg by mouth daily.   BREO ELLIPTA 100-25 MCG/INH Aepb Generic drug:  fluticasone furoate-vilanterol Inhale 1 puff into the lungs daily.   ferrous sulfate 325 (65 FE) MG tablet Take 325 mg by mouth 2 (two) times daily with a meal.   ipratropium-albuterol 0.5-2.5 (3) MG/3ML Soln Commonly known as:  DUONEB Take 0.5 mg by nebulization every 6 (six) hours as needed.   nortriptyline 25 MG capsule Commonly known as:  PAMELOR Take 25 mg by mouth at bedtime.  pantoprazole 40 MG tablet Commonly known as:  PROTONIX Take 1 tablet (40 mg total) by mouth daily.   predniSONE 10 MG tablet Commonly known as:  DELTASONE Take 1 tablet (10 mg total) by mouth daily. 6 tabs PO x 2 days 5 tabs PO x 2 days 4 tabs PO x 2 days 3 tabs PO x 2 days 2 tabs PO x 2 days 1 tab PO x 2 days and stop   simvastatin 40 MG tablet Commonly known as:  ZOCOR Take 40 mg by mouth at  bedtime.   tiotropium 18 MCG inhalation capsule Commonly known as:  SPIRIVA Place 18 mcg into inhaler and inhale daily.            Durable Medical Equipment  (From admission, onward)        Start     Ordered   01/28/18 0943  For home use only DME oxygen  Once    Question Answer Comment  Mode or (Route) Nasal cannula   Liters per Minute 4   Frequency Continuous (stationary and portable oxygen unit needed)   Oxygen conserving device Yes   Oxygen delivery system Gas      01/28/18 0942       DISCHARGE INSTRUCTIONS:   1. PCP follow-up in 1-2 weeks 2. Pulmonary follow-up in 1-2 weeks  DIET:   Cardiac diet  ACTIVITY:   Activity as tolerated  OXYGEN:   Home Oxygen: Yes.    Oxygen Delivery: 4 liters/min via Patient connected to nasal cannula oxygen  DISCHARGE LOCATION:   home   If you experience worsening of your admission symptoms, develop shortness of breath, life threatening emergency, suicidal or homicidal thoughts you must seek medical attention immediately by calling 911 or calling your MD immediately  if symptoms less severe.  You Must read complete instructions/literature along with all the possible adverse reactions/side effects for all the Medicines you take and that have been prescribed to you. Take any new Medicines after you have completely understood and accpet all the possible adverse reactions/side effects.   Please note  You were cared for by a hospitalist during your hospital stay. If you have any questions about your discharge medications or the care you received while you were in the hospital after you are discharged, you can call the unit and asked to speak with the hospitalist on call if the hospitalist that took care of you is not available. Once you are discharged, your primary care physician will handle any further medical issues. Please note that NO REFILLS for any discharge medications will be authorized once you are discharged, as it is  imperative that you return to your primary care physician (or establish a relationship with a primary care physician if you do not have one) for your aftercare needs so that they can reassess your need for medications and monitor your lab values.    On the day of Discharge:  VITAL SIGNS:   Blood pressure 122/74, pulse 89, temperature 98.1 F (36.7 C), temperature source Oral, resp. rate 16, height 5' (1.524 m), weight 53.6 kg (118 lb 1.6 oz), SpO2 98 %.  PHYSICAL EXAMINATION:    GENERAL:  62 y.o.-year-old patient lying in the bed with no acute distress.  EYES: Pupils equal, round, reactive to light and accommodation. No scleral icterus. Extraocular muscles intact.  HEENT: Head atraumatic, normocephalic. Oropharynx and nasopharynx clear.  NECK:  Supple, no jugular venous distention. No thyroid enlargement, no tenderness.  LUNGS: scant breath sounds on auscultation  with poor air entry. minimal wheezing. no rales,rhonchi or crepitation. No use of accessory muscles of respiration. Able to speak in full sentences without any distress CARDIOVASCULAR: S1, S2 normal. No murmurs, rubs, or gallops.  ABDOMEN: Soft, nontender, nondistended. Bowel sounds present. No organomegaly or mass.  EXTREMITIES: No pedal edema, cyanosis, or clubbing.  NEUROLOGIC: Cranial nerves II through XII are intact. Muscle strength 5/5 in all extremities. Sensation intact. Gait not checked.  PSYCHIATRIC: The patient is alert and oriented x 3.  SKIN: No obvious rash, lesion, or ulcer.     DATA REVIEW:   CBC Recent Labs  Lab 01/24/18 0435  WBC 20.3*  HGB 11.3*  HCT 33.8*  PLT 483*    Chemistries  Recent Labs  Lab 01/22/18 1210  01/26/18 0540 01/27/18 0418  NA 135   < > 134*  --   K 2.9*   < > 4.1  --   CL 95*   < > 96*  --   CO2 28   < > 29  --   GLUCOSE 100*   < > 118*  --   BUN 14   < > 15  --   CREATININE 0.75   < > 0.74  --   CALCIUM 8.3*   < > 8.4*  --   MG  --    < > 2.4 2.3  AST 20  --   --    --   ALT 8*  --   --   --   ALKPHOS 68  --   --   --   BILITOT 0.7  --   --   --    < > = values in this interval not displayed.     Microbiology Results  Results for orders placed or performed during the hospital encounter of 01/22/18  Blood Culture (routine x 2)     Status: None   Collection Time: 01/22/18 12:25 PM  Result Value Ref Range Status   Specimen Description BLOOD LFOA  Final   Special Requests   Final    BOTTLES DRAWN AEROBIC AND ANAEROBIC Blood Culture adequate volume   Culture   Final    NO GROWTH 5 DAYS Performed at [facility], [address] [place], [place] [address]    Report Status 01/27/2018 FINAL  Final  Blood Culture (routine x 2)     Status: None   Collection Time: 01/22/18 12:25 PM  Result Value Ref Range Status   Specimen Description BLOOD LAC  Final   Special Requests   Final    BOTTLES DRAWN AEROBIC AND ANAEROBIC Blood Culture adequate volume   Culture   Final    NO GROWTH 5 DAYS Performed at [facility], [address] [place], [place] [address]    Report Status 01/27/2018 FINAL  Final    RADIOLOGY:  No results found.   Management plans discussed with the patient, family and they are in agreement.  CODE STATUS:     Code Status Orders  (From admission, onward)        Start     Ordered   01/22/18 1420  Full code  Continuous     01/22/18 1424    Code Status History    Date Active Date Inactive Code Status Order ID Comments User Context   01/18/2016 17:46 01/23/2016 12:43 Full Code [id number]  Nita Sells, MD Inpatient   11/06/2015 02:02 11/07/2015 12:47 Full Code [id number]  Oswald Hillock, MD Inpatient  TOTAL TIME TAKING CARE OF THIS PATIENT: 38 minutes.    Gladstone Lighter M.D on 01/28/2018 at 9:48 AM  Between 7am to 6pm - Pager - [phone]  After 6pm go to [email or web address] - Technical brewer [place] [facility]  Office  [phone]  CC: Primary care physician;  Center, [facility]   Note: This dictation was prepared with Dragon dictation along with smaller phrase technology. Any transcriptional errors that result from this process are unintentional.

## 2018-01-29 ENCOUNTER — Other Ambulatory Visit: Payer: Self-pay

## 2018-01-29 ENCOUNTER — Inpatient Hospital Stay
Admission: EM | Admit: 2018-01-29 | Discharge: 2018-02-01 | DRG: 871 | Disposition: A | Discharge status: Home Health | Payer: Medicare HMO | Attending: Internal Medicine | Admitting: Internal Medicine

## 2018-01-29 ENCOUNTER — Encounter: Payer: Self-pay | Admitting: Emergency Medicine

## 2018-01-29 ENCOUNTER — Emergency Department: Payer: Medicare HMO

## 2018-01-29 DIAGNOSIS — Z9981 Dependence on supplemental oxygen: Secondary | ICD-10-CM | POA: Diagnosis not present

## 2018-01-29 DIAGNOSIS — J9601 Acute respiratory failure with hypoxia: Secondary | ICD-10-CM

## 2018-01-29 DIAGNOSIS — R0602 Shortness of breath: Secondary | ICD-10-CM | POA: Diagnosis not present

## 2018-01-29 DIAGNOSIS — E785 Hyperlipidemia, unspecified: Secondary | ICD-10-CM | POA: Diagnosis present

## 2018-01-29 DIAGNOSIS — Z87891 Personal history of nicotine dependence: Secondary | ICD-10-CM

## 2018-01-29 DIAGNOSIS — J439 Emphysema, unspecified: Secondary | ICD-10-CM | POA: Diagnosis present

## 2018-01-29 DIAGNOSIS — E871 Hypo-osmolality and hyponatremia: Secondary | ICD-10-CM | POA: Diagnosis present

## 2018-01-29 DIAGNOSIS — J189 Pneumonia, unspecified organism: Secondary | ICD-10-CM | POA: Diagnosis present

## 2018-01-29 DIAGNOSIS — Z7951 Long term (current) use of inhaled steroids: Secondary | ICD-10-CM

## 2018-01-29 DIAGNOSIS — I959 Hypotension, unspecified: Secondary | ICD-10-CM | POA: Diagnosis present

## 2018-01-29 DIAGNOSIS — E876 Hypokalemia: Secondary | ICD-10-CM | POA: Diagnosis not present

## 2018-01-29 DIAGNOSIS — E7800 Pure hypercholesterolemia, unspecified: Secondary | ICD-10-CM | POA: Diagnosis present

## 2018-01-29 DIAGNOSIS — Z7982 Long term (current) use of aspirin: Secondary | ICD-10-CM | POA: Diagnosis not present

## 2018-01-29 DIAGNOSIS — A419 Sepsis, unspecified organism: Secondary | ICD-10-CM

## 2018-01-29 DIAGNOSIS — J9621 Acute and chronic respiratory failure with hypoxia: Secondary | ICD-10-CM | POA: Diagnosis present

## 2018-01-29 DIAGNOSIS — I10 Essential (primary) hypertension: Secondary | ICD-10-CM | POA: Diagnosis present

## 2018-01-29 DIAGNOSIS — Z825 Family history of asthma and other chronic lower respiratory diseases: Secondary | ICD-10-CM | POA: Diagnosis not present

## 2018-01-29 DIAGNOSIS — Y95 Nosocomial condition: Secondary | ICD-10-CM | POA: Diagnosis present

## 2018-01-29 LAB — URINALYSIS, COMPLETE (UACMP) WITH MICROSCOPIC
BILIRUBIN URINE: NEGATIVE
GLUCOSE, UA: NEGATIVE mg/dL
HGB URINE DIPSTICK: NEGATIVE
Ketones, ur: NEGATIVE mg/dL
Leukocytes, UA: NEGATIVE
NITRITE: NEGATIVE
Protein, ur: NEGATIVE mg/dL
RBC / HPF: NONE SEEN RBC/hpf (ref 0–5)
SPECIFIC GRAVITY, URINE: 1.004 — AB (ref 1.005–1.030)
Squamous Epithelial / LPF: NONE SEEN
pH: 6 (ref 5.0–8.0)

## 2018-01-29 LAB — BLOOD GAS, VENOUS
Acid-Base Excess: 7.5 mmol/L — ABNORMAL HIGH (ref 0.0–2.0)
Bicarbonate: 35.9 mmol/L — ABNORMAL HIGH (ref 20.0–28.0)
PATIENT TEMPERATURE: 37
PH VEN: 7.33 (ref 7.250–7.430)
pCO2, Ven: 68 mmHg — ABNORMAL HIGH (ref 44.0–60.0)

## 2018-01-29 LAB — COMPREHENSIVE METABOLIC PANEL
ALBUMIN: 3 g/dL — AB (ref 3.5–5.0)
ALK PHOS: 58 U/L (ref 38–126)
ALT: 9 U/L — AB (ref 14–54)
AST: 17 U/L (ref 15–41)
Anion gap: 11 (ref 5–15)
BILIRUBIN TOTAL: 0.6 mg/dL (ref 0.3–1.2)
BUN: 23 mg/dL — AB (ref 6–20)
CO2: 30 mmol/L (ref 22–32)
CREATININE: 0.81 mg/dL (ref 0.44–1.00)
Calcium: 8.3 mg/dL — ABNORMAL LOW (ref 8.9–10.3)
Chloride: 91 mmol/L — ABNORMAL LOW (ref 101–111)
GFR calc Af Amer: >60 mL/min (ref >60)
GFR calc non Af Amer: >60 mL/min (ref >60)
GLUCOSE: 103 mg/dL — AB (ref 65–99)
Potassium: 3.5 mmol/L (ref 3.5–5.1)
Sodium: 132 mmol/L — ABNORMAL LOW (ref 135–145)
TOTAL PROTEIN: 6.5 g/dL (ref 6.5–8.1)

## 2018-01-29 LAB — CBC WITH DIFFERENTIAL/PLATELET
BASOS ABS: 0 10*3/uL (ref 0–0.1)
BASOS PCT: 0 %
EOS PCT: 0 %
Eosinophils Absolute: 0.1 10*3/uL (ref 0–0.7)
HCT: 38.8 % (ref 35.0–47.0)
Hemoglobin: 12.4 g/dL (ref 12.0–16.0)
Lymphocytes Relative: 6 %
Lymphs Abs: 1.7 10*3/uL (ref 1.0–3.6)
MCH: 27.6 pg (ref 26.0–34.0)
MCHC: 31.9 g/dL — ABNORMAL LOW (ref 32.0–36.0)
MCV: 86.4 fL (ref 80.0–100.0)
MONO ABS: 1.5 10*3/uL — AB (ref 0.2–0.9)
Monocytes Relative: 5 %
Neutro Abs: 26.6 10*3/uL — ABNORMAL HIGH (ref 1.4–6.5)
Neutrophils Relative %: 89 %
PLATELETS: 469 10*3/uL — AB (ref 150–440)
RBC: 4.49 MIL/uL (ref 3.80–5.20)
RDW: 13.5 % (ref 11.5–14.5)
WBC: 29.9 10*3/uL — ABNORMAL HIGH (ref 3.6–11.0)

## 2018-01-29 LAB — MRSA PCR SCREENING: MRSA BY PCR: NEGATIVE

## 2018-01-29 LAB — LACTIC ACID, PLASMA
LACTIC ACID, VENOUS: 1.1 mmol/L (ref 0.5–1.9)
Lactic Acid, Venous: 1.4 mmol/L (ref 0.5–1.9)

## 2018-01-29 LAB — PROCALCITONIN: Procalcitonin: 0.39 ng/mL

## 2018-01-29 LAB — INFLUENZA PANEL BY PCR (TYPE A & B)
INFLAPCR: NEGATIVE
Influenza B By PCR: NEGATIVE

## 2018-01-29 MED ORDER — FERROUS SULFATE 325 (65 FE) MG PO TABS
325.0000 mg | ORAL_TABLET | Freq: Two times a day (BID) | ORAL | Status: DC
  Administered 2018-01-29 – 2018-02-01 (×6): 325 mg via ORAL
  Filled 2018-01-29 (×6): qty 1

## 2018-01-29 MED ORDER — SODIUM CHLORIDE 0.9 % IV SOLN
1250.0000 mg | INTRAVENOUS | Status: DC
  Filled 2018-01-29: qty 1250

## 2018-01-29 MED ORDER — IPRATROPIUM-ALBUTEROL 0.5-2.5 (3) MG/3ML IN SOLN
3.0000 mL | Freq: Once | RESPIRATORY_TRACT | Status: AC
  Administered 2018-01-29: 3 mL via RESPIRATORY_TRACT

## 2018-01-29 MED ORDER — SODIUM CHLORIDE 0.9 % IV BOLUS (SEPSIS)
2000.0000 mL | Freq: Once | INTRAVENOUS | Status: AC
  Administered 2018-01-29: 2000 mL via INTRAVENOUS

## 2018-01-29 MED ORDER — GABAPENTIN 300 MG PO CAPS
600.0000 mg | ORAL_CAPSULE | Freq: Three times a day (TID) | ORAL | Status: DC
  Administered 2018-01-29 – 2018-02-01 (×9): 600 mg via ORAL
  Filled 2018-01-29 (×9): qty 2

## 2018-01-29 MED ORDER — NORTRIPTYLINE HCL 25 MG PO CAPS
25.0000 mg | ORAL_CAPSULE | Freq: Every day | ORAL | Status: DC
  Administered 2018-01-29 – 2018-01-31 (×3): 25 mg via ORAL
  Filled 2018-01-29 (×3): qty 1

## 2018-01-29 MED ORDER — PREDNISONE 20 MG PO TABS
10.0000 mg | ORAL_TABLET | Freq: Every day | ORAL | Status: DC
  Administered 2018-01-29 – 2018-02-01 (×4): 10 mg via ORAL
  Filled 2018-01-29 (×4): qty 1

## 2018-01-29 MED ORDER — ONDANSETRON HCL 4 MG PO TABS: 4.0000 mg | ORAL_TABLET | Freq: Four times a day (QID) | ORAL | Status: DC | PRN

## 2018-01-29 MED ORDER — ACETAMINOPHEN 325 MG PO TABS: 650.0000 mg | ORAL_TABLET | Freq: Four times a day (QID) | ORAL | Status: DC | PRN

## 2018-01-29 MED ORDER — VANCOMYCIN HCL IN DEXTROSE 1-5 GM/200ML-% IV SOLN
1000.0000 mg | Freq: Once | INTRAVENOUS | Status: AC
  Administered 2018-01-29: 1000 mg via INTRAVENOUS
  Filled 2018-01-29: qty 200

## 2018-01-29 MED ORDER — IPRATROPIUM-ALBUTEROL 0.5-2.5 (3) MG/3ML IN SOLN
3.0000 mL | Freq: Four times a day (QID) | RESPIRATORY_TRACT | Status: DC
  Administered 2018-01-29 – 2018-02-01 (×11): 3 mL via RESPIRATORY_TRACT
  Filled 2018-01-29 (×12): qty 3

## 2018-01-29 MED ORDER — IPRATROPIUM-ALBUTEROL 0.5-2.5 (3) MG/3ML IN SOLN
3.0000 mL | Freq: Once | RESPIRATORY_TRACT | Status: AC
  Administered 2018-01-29: 3 mL via RESPIRATORY_TRACT
  Filled 2018-01-29: qty 9

## 2018-01-29 MED ORDER — SENNOSIDES-DOCUSATE SODIUM 8.6-50 MG PO TABS: 1.0000 | ORAL_TABLET | Freq: Every evening | ORAL | Status: DC | PRN

## 2018-01-29 MED ORDER — ACETAMINOPHEN 650 MG RE SUPP: 650.0000 mg | Freq: Four times a day (QID) | RECTAL | Status: DC | PRN

## 2018-01-29 MED ORDER — ENOXAPARIN SODIUM 40 MG/0.4ML ~~LOC~~ SOLN
40.0000 mg | SUBCUTANEOUS | Status: DC
  Administered 2018-01-29 – 2018-01-31 (×3): 40 mg via SUBCUTANEOUS
  Filled 2018-01-29 (×3): qty 0.4

## 2018-01-29 MED ORDER — SIMVASTATIN 40 MG PO TABS
40.0000 mg | ORAL_TABLET | Freq: Every day | ORAL | Status: DC
  Administered 2018-01-29 – 2018-01-31 (×3): 40 mg via ORAL
  Filled 2018-01-29 (×3): qty 1

## 2018-01-29 MED ORDER — SODIUM CHLORIDE 0.9 % IV SOLN
1.0000 g | Freq: Once | INTRAVENOUS | Status: AC
  Administered 2018-01-29: 1 g via INTRAVENOUS
  Filled 2018-01-29: qty 1

## 2018-01-29 MED ORDER — ASPIRIN EC 81 MG PO TBEC
81.0000 mg | DELAYED_RELEASE_TABLET | Freq: Every day | ORAL | Status: DC
  Administered 2018-01-30 – 2018-02-01 (×3): 81 mg via ORAL
  Filled 2018-01-29 (×4): qty 1

## 2018-01-29 MED ORDER — ACETAMINOPHEN 500 MG PO TABS
1000.0000 mg | ORAL_TABLET | Freq: Once | ORAL | Status: AC
  Administered 2018-01-29: 1000 mg via ORAL
  Filled 2018-01-29: qty 2

## 2018-01-29 MED ORDER — AMLODIPINE BESYLATE 5 MG PO TABS
5.0000 mg | ORAL_TABLET | Freq: Every day | ORAL | Status: DC
  Administered 2018-01-29 – 2018-01-31 (×3): 5 mg via ORAL
  Filled 2018-01-29 (×3): qty 1

## 2018-01-29 MED ORDER — SODIUM CHLORIDE 0.9 % IV SOLN
INTRAVENOUS | Status: DC
  Administered 2018-01-29: 16:00:00 via INTRAVENOUS

## 2018-01-29 MED ORDER — ONDANSETRON HCL 4 MG/2ML IJ SOLN: 4.0000 mg | Freq: Four times a day (QID) | INTRAMUSCULAR | Status: DC | PRN

## 2018-01-29 MED ORDER — PANTOPRAZOLE SODIUM 40 MG PO TBEC
40.0000 mg | DELAYED_RELEASE_TABLET | Freq: Every day | ORAL | Status: DC
  Administered 2018-01-29 – 2018-02-01 (×4): 40 mg via ORAL
  Filled 2018-01-29 (×4): qty 1

## 2018-01-29 MED ORDER — SODIUM CHLORIDE 0.9 % IV BOLUS (SEPSIS)
1000.0000 mL | Freq: Once | INTRAVENOUS | Status: AC
  Administered 2018-01-29: 1000 mL via INTRAVENOUS

## 2018-01-29 MED ORDER — SODIUM CHLORIDE 0.9 % IV SOLN
2.0000 g | Freq: Two times a day (BID) | INTRAVENOUS | Status: DC
  Administered 2018-01-29 – 2018-02-01 (×6): 2 g via INTRAVENOUS
  Filled 2018-01-29 (×7): qty 2

## 2018-01-29 NOTE — Care Management (Signed)
Patient discharged to home yesterday with an COPD protocol through [facility] [place]. I have sent message to [facility] with [facility] for follow up.

## 2018-01-29 NOTE — Progress Notes (Signed)
CODE SEPSIS - PHARMACY COMMUNICATION  **Broad Spectrum Antibiotics should be administered within 1 hour of Sepsis diagnosis**  Time Code Sepsis Called/Page Received: 1214  Antibiotics Ordered: Cefepime/Vancomycin  Time of 1st antibiotic administration: 1222  Additional action taken by pharmacy: N/A  If necessary, Name of Provider/Nurse Contacted: N/A    Simpson,Michael L ,PharmD Clinical Pharmacist  01/29/2018  12:46 PM

## 2018-01-29 NOTE — ED Notes (Signed)
Pt placed on bedpan, attempted to obtain UA sample at this time, pt unable to urinate.

## 2018-01-29 NOTE — ED Triage Notes (Signed)
Pt presents to ED via [facility] with c/o [facility] that started yesterday. Per EMS pt was D/C from hospital yesterday with dx of pneumonia, sent home of 4L. EMS reportgs on their arrival pt was 80% on 4L via [place], pt up to 100% on facemask during breathing tx, pt BP 82/60, tachycardic at 125. Pt c/o pain to R lower ribs yesterday, pain has since resolved and no pain today.

## 2018-01-29 NOTE — H&P (Signed)
[facility] [place] NAME: [name]    MR#:  [id number]  DATE OF BIRTH:  [birth year]  DATE OF ADMISSION:  01/29/2018  PRIMARY CARE PHYSICIAN: [facility] [place]   REQUESTING/REFERRING PHYSICIAN:   CHIEF COMPLAINT:   Chief Complaint  Patient presents with  . Pneumonia    HISTORY OF PRESENT ILLNESS: Kaneshia Cater  is a 62 y.o. female with a known history of COPD, hyperlipidemia, hypertension was discharged on 01/28/2018 after being treated for COPD exacerbation and pneumonia.  Patient went home she felt short of breath and also had a fever patient had a temperature of 101 F.  She has productive cough of yellowish phlegm.  She is currently requiring oxygen by nasal cannula at 6 L baseline was 4 L.  She finished a course of Levaquin antibiotic prior to being discharged.  Patient currently on oral steroids tapering dose no complaints of any chest pain.  Workup in the emergency room chest x-ray revealed bibasilar opacities.  Patient was also hypotensive when she presented to the emergency room, code sepsis was called and patient was given IV fluids and broad-spectrum IV antibiotics.  PAST MEDICAL HISTORY:   Past Medical History:  Diagnosis Date  . COPD (chronic obstructive pulmonary disease) ([place])   . High cholesterol   . Hypertension     PAST SURGICAL HISTORY:  Past Surgical History:  Procedure Laterality Date  . HALLUX VALGUS BASE WEDGE Right 06/09/2015   Procedure: Base wedge osteotomy with modified McBride right foot ;  Surgeon: Sharlotte Alamo, MD;  Location: [facility] ORS;  Service: Podiatry;  Laterality: Right;    SOCIAL HISTORY:  Social History   Tobacco Use  . Smoking status: Former Smoker    Last attempt to quit: 11/04/2013    Years since quitting: 4.2  . Smokeless tobacco: Never Used  Substance Use Topics  . Alcohol use: No    FAMILY HISTORY:  Family History  Problem Relation Age of Onset  . Alzheimer's disease  Mother   . COPD Father   . Breast cancer Neg Hx     DRUG ALLERGIES: No Known Allergies  REVIEW OF SYSTEMS:   CONSTITUTIONAL: Has fever,  No fatigue or weakness.  EYES: No blurred or double vision.  EARS, NOSE, AND THROAT: No tinnitus or ear pain.  RESPIRATORY: Has cough, shortness of breath,  No wheezing or hemoptysis.  CARDIOVASCULAR: No chest pain, orthopnea, edema.  GASTROINTESTINAL: No nausea, vomiting, diarrhea or abdominal pain.  GENITOURINARY: No dysuria, hematuria.  ENDOCRINE: No polyuria, nocturia,  HEMATOLOGY: No anemia, easy bruising or bleeding SKIN: No rash or lesion. MUSCULOSKELETAL: No joint pain or arthritis.   NEUROLOGIC: No tingling, numbness, weakness.  PSYCHIATRY: No anxiety or depression.   MEDICATIONS AT HOME:  Prior to Admission medications   Medication Sig Start Date End Date Taking? Authorizing Provider  albuterol (PROVENTIL HFA;VENTOLIN HFA) 108 (90 BASE) MCG/ACT inhaler Inhale 4-6 puffs into the lungs every 6 (six) hours as needed for wheezing or shortness of breath.   Yes [provider]  amLODipine (NORVASC) 5 MG tablet Take 5 mg by mouth at bedtime.   Yes [provider]  aspirin EC 81 MG tablet Take 81 mg by mouth daily.   Yes [provider]  ferrous sulfate 325 (65 FE) MG tablet Take 325 mg by mouth 2 (two) times daily with a meal.    Yes [provider]  fluticasone furoate-vilanterol (BREO ELLIPTA) 100-25 MCG/INH AEPB Inhale  1 puff into the lungs daily.   Yes [provider]  gabapentin (NEURONTIN) 300 MG capsule Take 600 mg by mouth 3 (three) times daily.   Yes [provider]  ipratropium-albuterol (DUONEB) 0.5-2.5 (3) MG/3ML SOLN Take 0.5 mg by nebulization every 6 (six) hours as needed. 08/03/12  Yes [provider]  nortriptyline (PAMELOR) 25 MG capsule Take 25 mg by mouth at bedtime.   Yes [provider]  OXYGEN Inhale 2 L into the lungs continuous.   Yes [provider]  pantoprazole (PROTONIX) 40 MG tablet Take 1 tablet (40 mg total) by mouth daily. 01/28/18  Yes Gladstone Lighter, MD  predniSONE (DELTASONE) 10 MG tablet Take 1 tablet (10 mg total) by mouth daily. 6 tabs PO x 2 days 5 tabs PO x 2 days 4 tabs PO x 2 days 3 tabs PO x 2 days 2 tabs PO x 2 days 1 tab PO x 2 days and stop 01/28/18  Yes Gladstone Lighter, MD  simvastatin (ZOCOR) 40 MG tablet Take 40 mg by mouth at bedtime.   Yes [provider]  tiotropium (SPIRIVA) 18 MCG inhalation capsule Place 18 mcg into inhaler and inhale daily.   Yes [provider]      PHYSICAL EXAMINATION:   VITAL SIGNS: Blood pressure (!) 95/51, pulse 97, temperature (!) 101.3 F (38.5 C), temperature source Axillary, resp. rate 18, height 5' (1.524 m), weight 54.4 kg (120 lb), SpO2 100 %.  GENERAL:  62 y.o.-year-old patient lying in the bed with no acute distress.  EYES: Pupils equal, round, reactive to light and accommodation. No scleral icterus. Extraocular muscles intact.  HEENT: Head atraumatic, normocephalic. Oropharynx and nasopharynx clear.  NECK:  Supple, no jugular venous distention. No thyroid enlargement, no tenderness.  LUNGS: Decreased breath sounds bilaterally, bilateral rales heard. No use of accessory muscles of respiration.  CARDIOVASCULAR: S1, S2 normal. No murmurs, rubs, or gallops.  ABDOMEN: Soft, nontender, nondistended. Bowel sounds present. No organomegaly or mass.  EXTREMITIES: No pedal edema, cyanosis, or clubbing.  NEUROLOGIC: Cranial nerves II through XII are intact. Muscle strength 5/5 in all extremities. Sensation intact. Gait not checked.  PSYCHIATRIC: The patient is alert and oriented x 3.  SKIN: No obvious rash, lesion, or ulcer.   LABORATORY PANEL:   CBC Recent Labs  Lab 01/23/18 0451 01/24/18 0435 01/29/18 1204  WBC 22.7* 20.3* 29.9*  HGB 10.9* 11.3* 12.4  HCT 32.8* 33.8* 38.8  PLT 436 483* 469*  MCV 86.5 86.2 86.4  MCH 28.7 28.7  27.6  MCHC 33.2 33.3 31.9*  RDW 13.4 13.4 13.5  LYMPHSABS  --   --  1.7  MONOABS  --   --  1.5*  EOSABS  --   --  0.1  BASOSABS  --   --  0.0   ------------------------------------------------------------------------------------------------------------------  Chemistries  Recent Labs  Lab 01/22/18 2201 01/23/18 0451 01/24/18 0435 01/25/18 0451 01/26/18 0540 01/27/18 0418 01/29/18 1204  NA 137 139 138  --  134*  --  132*  K 3.6 3.8 3.8  --  4.1  --  3.5  CL 102 105 99*  --  96*  --  91*  CO2 23 26 29   --  29  --  30  GLUCOSE 255* 100* 129*  --  118*  --  103*  BUN 10 11 10   --  15  --  23*  CREATININE 0.69 0.54 0.57  --  0.74  --  0.81  CALCIUM  7.8* 8.1* 8.9  --  8.4*  --  8.3*  MG  --  2.2 2.0 2.6* 2.4 2.3  --   AST  --   --   --   --   --   --  17  ALT  --   --   --   --   --   --  9*  ALKPHOS  --   --   --   --   --   --  58  BILITOT  --   --   --   --   --   --  0.6   ------------------------------------------------------------------------------------------------------------------ estimated creatinine clearance is 53.7 mL/min (by C-G formula based on SCr of 0.81 mg/dL). ------------------------------------------------------------------------------------------------------------------ No results for input(s): TSH, T4TOTAL, T3FREE, THYROIDAB in the last 72 hours.  Invalid input(s): FREET3   Coagulation profile No results for input(s): INR, PROTIME in the last 168 hours. ------------------------------------------------------------------------------------------------------------------- No results for input(s): DDIMER in the last 72 hours. -------------------------------------------------------------------------------------------------------------------  Cardiac Enzymes No results for input(s): CKMB, TROPONINI, MYOGLOBIN in the last 168 hours.  Invalid input(s):  CK ------------------------------------------------------------------------------------------------------------------ Invalid input(s): POCBNP  ---------------------------------------------------------------------------------------------------------------  Urinalysis    Component Value Date/Time   COLORURINE Colorless 10/08/2013 2007   APPEARANCEUR Clear 10/08/2013 2007   LABSPEC 1.003 10/08/2013 2007   PHURINE 7.0 10/08/2013 2007   GLUCOSEU Negative 10/08/2013 2007   HGBUR Negative 10/08/2013 2007   BILIRUBINUR Negative 10/08/2013 2007   KETONESUR Negative 10/08/2013 2007   PROTEINUR Negative 10/08/2013 2007   NITRITE Negative 10/08/2013 2007   LEUKOCYTESUR Negative 10/08/2013 2007     RADIOLOGY: Dg Chest Portable 1 View  Result Date: 01/29/2018 CLINICAL DATA:  Sob since yesterday, released from hospital 4 fays ago pneumonia, fever EXAM: PORTABLE CHEST 1 VIEW COMPARISON:  01/26/2018 and 01/22/2018. FINDINGS: There is lung base opacity mostly silhouetting the right and partly silhouetting the left hemidiaphragms. This has increased when compared to the study dated 01/22/2018, likely due to atelectasis. A component of pneumonia is possible at either or both lung bases. No definite pleural effusion. Remainder of the lungs is clear. No pneumothorax. Cardiac silhouette is top-normal in size. No mediastinal or hilar masses. Skeletal structures are demineralized. Old healed rib fractures noted on the left. IMPRESSION: 1. Increased lung base opacities since the most recent prior exams, consistent with atelectasis, pneumonia or a combination. 2. No evidence of pulmonary edema. Electronically Signed   By: Lajean Manes M.D.   On: 01/29/2018 13:53    EKG: Orders placed or performed during the hospital encounter of 01/29/18  . EKG 12-Lead  . EKG 12-Lead  . ED EKG 12-Lead  . ED EKG 12-Lead    IMPRESSION AND PLAN:  62 year old female patient with history of emphysema, hypertension,  hyperlipidemia presented to the emergency room with cough, shortness of breath and fever    1.Healthcare associated pneumonia. Admit patient to medical floor IV vancomycin and IV cefepime antibiotics Follow-up cultures  2 Hypoxia  and respiratory distress Oxygen via nasal cannula Nebulization treatments  3.  Sepsis  IV fluid hydration IV antibiotics as above  4.  Hypotension IV fluids  5.  Emphysema  continue oxygen supply Continue tapering dose of steroids     All the records are reviewed and case discussed with ED provider. Management plans discussed with the patient, family and they are in agreement.  CODE STATUS:FULL CODE Code Status History    Date Active Date Inactive Code Status Order ID Comments User Context   01/22/2018  14:24 01/28/2018 17:30 Full Code [id number]  Epifanio Lesches, MD ED   01/18/2016 17:46 01/23/2016 12:43 Full Code [id number]  Nita Sells, MD Inpatient   11/06/2015 02:02 11/07/2015 12:47 Full Code [id number]  Oswald Hillock, MD Inpatient       TOTAL TIME TAKING CARE OF THIS PATIENT: 49 minutes.    Saundra Shelling M.D on 01/29/2018 at 3:03 PM  Between 7am to 6pm - Pager - [phone]  After 6pm go to [email or web address] - password [email or web address] [facility]  [place]  [facility]ospital[facility]  Office  [phone]  CC: Primary care physician; Center, [facility] [place] [facility]

## 2018-01-29 NOTE — ED Notes (Signed)
This RN to bedside, pt noted to be 88% on 4L, this RN turned patient 5L via [place], pt O2 temporarily 93% then desat back to 87%. This RN turned patient up to 6L via [employer], pt O2 sats continue to be 88-90%. This RN spoke with MD regarding O2 sats and high flow [place]. RT contacted regarding placing patient on high flow [place].

## 2018-01-29 NOTE — ED Notes (Signed)
CODE  SEPSIS  CALLED  TO  [place] 

## 2018-01-29 NOTE — ED Provider Notes (Signed)
[facility] Emergency Department Provider Note  ____________________________________________  Time seen: Approximately 12:09 PM  I have reviewed the triage vital signs and the nursing notes.   HISTORY  Chief Complaint Pneumonia   HPI [name] is a 62 y.o. female with a history of COPD, hypertension and hyperlipidemia who presents for evaluation of shortness of breath. Patient was discharged from the hospitalyesterday after being admitted for 6 nights for PNA and COPD exacerbation. Patient reports that since going home yesterday she has become progressively more short of breath and this morning she had severe constant shortness of breat and fever. She also has a productive cough. She denies chest pain, nausea, vomiting, diarrhea, dysuria or hematuria. She has been using her inhalers at home with no relief today. She is supposed to be on 4 L nasal cannula and has required 6 L at home. She finished a course of Levaquin prior to being discharged.  Past Medical History:  Diagnosis Date  . COPD (chronic obstructive pulmonary disease) ([place])   . High cholesterol   . Hypertension     Patient Active Problem List   Diagnosis Date Noted  . HCAP (healthcare-associated pneumonia) 01/29/2018  . Acute on chronic respiratory failure ([place]) 01/22/2018  . CAP (community acquired pneumonia) 01/18/2016  . Anemia, iron deficiency 11/07/2015  . Chronic respiratory failure ([place]) 11/07/2015  . Acute on chronic respiratory failure with hypoxia ([place]) 11/07/2015  . Hyperlipidemia 11/07/2015  . COPD with acute exacerbation ([place]) 11/06/2015  . Hypertension 11/06/2015    Past Surgical History:  Procedure Laterality Date  . HALLUX VALGUS BASE WEDGE Right 06/09/2015   Procedure: Base wedge osteotomy with modified McBride right foot ;  Surgeon: Sharlotte Alamo, MD;  Location: [facility] ORS;  Service: Podiatry;  Laterality: Right;    Prior to Admission medications   Medication Sig Start  Date End Date Taking? Authorizing Provider  albuterol (PROVENTIL HFA;VENTOLIN HFA) 108 (90 BASE) MCG/ACT inhaler Inhale 4-6 puffs into the lungs every 6 (six) hours as needed for wheezing or shortness of breath.   Yes [provider]  amLODipine (NORVASC) 5 MG tablet Take 5 mg by mouth at bedtime.   Yes [provider]  aspirin EC 81 MG tablet Take 81 mg by mouth daily.   Yes [provider]  ferrous sulfate 325 (65 FE) MG tablet Take 325 mg by mouth 2 (two) times daily with a meal.    Yes [provider]  fluticasone furoate-vilanterol (BREO ELLIPTA) 100-25 MCG/INH AEPB Inhale 1 puff into the lungs daily.   Yes [provider]  gabapentin (NEURONTIN) 300 MG capsule Take 600 mg by mouth 3 (three) times daily.   Yes [provider]  ipratropium-albuterol (DUONEB) 0.5-2.5 (3) MG/3ML SOLN Take 0.5 mg by nebulization every 6 (six) hours as needed. 08/03/12  Yes [provider]  nortriptyline (PAMELOR) 25 MG capsule Take 25 mg by mouth at bedtime.   Yes [provider]  OXYGEN Inhale 2 L into the lungs continuous.   Yes [provider]  pantoprazole (PROTONIX) 40 MG tablet Take 1 tablet (40 mg total) by mouth daily. 01/28/18  Yes Gladstone Lighter, MD  predniSONE (DELTASONE) 10 MG tablet Take 1 tablet (10 mg total) by mouth daily. 6 tabs PO x 2 days 5 tabs PO x 2 days 4 tabs PO x 2 days 3 tabs PO x 2 days 2 tabs PO x 2 days 1 tab PO x 2 days and stop 01/28/18  Yes  Gladstone Lighter, MD  simvastatin (ZOCOR) 40 MG tablet Take 40 mg by mouth at bedtime.   Yes [provider]  tiotropium (SPIRIVA) 18 MCG inhalation capsule Place 18 mcg into inhaler and inhale daily.   Yes [provider]    Allergies Patient has no known allergies.  Family History  Problem Relation Age of Onset  . Breast cancer Neg Hx     Social History Social History   Tobacco Use  . Smoking status: Former Smoker    Last  attempt to quit: 11/04/2013    Years since quitting: 4.2  . Smokeless tobacco: Never Used  Substance Use Topics  . Alcohol use: No  . Drug use: No    Review of Systems  Constitutional: + fever. Eyes: Negative for visual changes. ENT: Negative for sore throat. Neck: No neck pain  Cardiovascular: Negative for chest pain. Respiratory: + shortness of breath, cough Gastrointestinal: Negative for abdominal pain, vomiting or diarrhea. Genitourinary: Negative for dysuria. Musculoskeletal: Negative for back pain. Skin: Negative for rash. Neurological: Negative for headaches, weakness or numbness. Psych: No SI or HI  ____________________________________________   PHYSICAL EXAM:  VITAL SIGNS: Vitals:   01/29/18 1330 01/29/18 1400  BP: (!) 100/50 (!) 95/51  Pulse: (!) 102 97  Resp: 17 18  Temp:    SpO2: 100% 100%   Constitutional: Alert and oriented, moderate respiratory distress.  HEENT:      Head: Normocephalic and atraumatic.         Eyes: Conjunctivae are normal. Sclera is non-icteric.       Mouth/Throat: Mucous membranes are dry.       Neck: Supple with no signs of meningismus. Cardiovascular: tachycardic with regular rhythm. No murmurs, gallops, or rubs. 2+ symmetrical distal pulses are present in all extremities. No JVD. Respiratory: increased work of breathing, decreased air movement bilaterally, diffuse expiratory wheezes Gastrointestinal: Soft, non tender, and non distended with positive bowel sounds. No rebound or guarding. Musculoskeletal: Nontender with normal range of motion in all extremities. No edema, cyanosis, or erythema of extremities. Neurologic: Normal speech and language. Face is symmetric. Moving all extremities. No gross focal neurologic deficits are appreciated. Skin: Skin is warm, dry and intact. No rash noted. Psychiatric: Mood and affect are normal. Speech and behavior are normal.  ____________________________________________   LABS (all labs  ordered are listed, but only abnormal results are displayed)  Labs Reviewed  COMPREHENSIVE METABOLIC PANEL - Abnormal; Notable for the following components:      Result Value   Sodium 132 (*)    Chloride 91 (*)    Glucose, Bld 103 (*)    BUN 23 (*)    Calcium 8.3 (*)    Albumin 3.0 (*)    ALT 9 (*)    All other components within normal limits  CBC WITH DIFFERENTIAL/PLATELET - Abnormal; Notable for the following components:   WBC 29.9 (*)    MCHC 31.9 (*)    Platelets 469 (*)    Neutro Abs 26.6 (*)    Monocytes Absolute 1.5 (*)    All other components within normal limits  BLOOD GAS, VENOUS - Abnormal; Notable for the following components:   pCO2, Ven 68 (*)    Bicarbonate 35.9 (*)    Acid-Base Excess 7.5 (*)    All other components within normal limits  CULTURE, BLOOD (ROUTINE X 2)  CULTURE, BLOOD (ROUTINE X 2)  URINE CULTURE  MRSA PCR SCREENING  LACTIC ACID, PLASMA  INFLUENZA PANEL BY PCR (TYPE  A & B)  PROCALCITONIN  LACTIC ACID, PLASMA  URINALYSIS, COMPLETE (UACMP) WITH MICROSCOPIC   ____________________________________________  EKG  ED ECG REPORT I, Rudene Re, the attending physician, personally viewed and interpreted this ECG.   sinus tachycardia, rate of 120, normal intervals, right axis deviation, no ST elevations or depressions, T-wave inversions in anterior leads.no significant changes when compared to prior from a week ago ____________________________________________  RADIOLOGY  I have personally reviewed the images performed during this visit and I agree with the Radiologist's read.   Interpretation by Radiologist:  Dg Chest Portable 1 View  Result Date: 01/29/2018 CLINICAL DATA:  Sob since yesterday, released from hospital 4 fays ago pneumonia, fever EXAM: PORTABLE CHEST 1 VIEW COMPARISON:  01/26/2018 and 01/22/2018. FINDINGS: There is lung base opacity mostly silhouetting the right and partly silhouetting the left hemidiaphragms. This has  increased when compared to the study dated 01/22/2018, likely due to atelectasis. A component of pneumonia is possible at either or both lung bases. No definite pleural effusion. Remainder of the lungs is clear. No pneumothorax. Cardiac silhouette is top-normal in size. No mediastinal or hilar masses. Skeletal structures are demineralized. Old healed rib fractures noted on the left. IMPRESSION: 1. Increased lung base opacities since the most recent prior exams, consistent with atelectasis, pneumonia or a combination. 2. No evidence of pulmonary edema. Electronically Signed   By: Lajean Manes M.D.   On: 01/29/2018 13:53     ____________________________________________   PROCEDURES  Procedure(s) performed: None Procedures Critical Care performed: yes  CRITICAL CARE Performed by: Rudene Re  ?  Total critical care time: 40 min  Critical care time was exclusive of separately billable procedures and treating other patients.  Critical care was necessary to treat or prevent imminent or life-threatening deterioration.  Critical care was time spent personally by me on the following activities: development of treatment plan with patient and/or surrogate as well as nursing, discussions with consultants, evaluation of patient's response to treatment, examination of patient, obtaining history from patient or surrogate, ordering and performing treatments and interventions, ordering and review of laboratory studies, ordering and review of radiographic studies, pulse oximetry and re-evaluation of patient's condition.  ____________________________________________   INITIAL IMPRESSION / ASSESSMENT AND PLAN / ED COURSE  62 y.o. female with a history of COPD, hypertension and hyperlipidemia who presents for evaluation of shortness of breath.on arrival to the emergency room patient meets sepsis criteria with fever, tachypnea, tachycardia and hypotension. Patient also with COPD exacerbation. Patient  was given 3 duo nabs, Solu-Medrol, started on fluids, Tylenol for fever, cefepime and vanc for HCAP confirmed on CXR. Labs showing normal pH, normal lactate, negative influenza swabs, a white count of 30K. Patient continue to have increased O2 requirement up to 6L after duonebs and was placed HFNC. HR improved after 2L IVF. BP still soft, 3rd L ordered. Patient will be admitted to Hospitalist for severe sepsis.      As part of my medical decision making, I reviewed the following data within the [place] notes reviewed and incorporated, Labs reviewed , EKG interpreted , Old EKG reviewed, Old chart reviewed, Radiograph reviewed , Discussed with admitting physician , Notes from prior ED visits and [place] Controlled Substance Database    Pertinent labs & imaging results that were available during my care of the patient were reviewed by me and considered in my medical decision making (see chart for details).    ____________________________________________   FINAL CLINICAL IMPRESSION(S) / ED DIAGNOSES  Final diagnoses:  Sepsis, due to unspecified organism ([place])  Healthcare-associated pneumonia  Acute respiratory failure with hypoxia ([place])      NEW MEDICATIONS STARTED DURING THIS VISIT:  ED Discharge Orders    None       Note:  This document was prepared using Dragon voice recognition software and may include unintentional dictation errors.    Alfred Levins, [place], MD 01/29/18 [phone]

## 2018-01-29 NOTE — Progress Notes (Signed)
Pharmacy Antibiotic Note  [name] is a 62 y.o. female admitted on 01/29/2018 with pneumonia.  Pharmacy has been consulted for cefepime dosing.  Plan: cefepime 2gm iv q12h   Height: 5' (152.4 cm) Weight: 124 lb 1.9 oz (56.3 kg) IBW/kg (Calculated) : 45.5  Temp (24hrs), Avg:99.9 F (37.7 C), Min:98.4 F (36.9 C), Max:101.3 F (38.5 C)  Recent Labs  Lab 01/22/18 2201 01/23/18 0451 01/24/18 0435 01/26/18 0540 01/29/18 1204 01/29/18 1206  WBC  --  22.7* 20.3*  --  29.9*  --   CREATININE 0.69 0.54 0.57 0.74 0.81  --   LATICACIDVEN  --   --   --   --   --  1.4    Estimated Creatinine Clearance: 58.8 mL/min (by C-G formula based on SCr of 0.81 mg/dL).    No Known Allergies  Antimicrobials this admission: Anti-infectives (From admission, onward)   Start     Dose/Rate Route Frequency Ordered Stop   01/29/18 2200  vancomycin (VANCOCIN) 1,250 mg in sodium chloride 0.9 % 250 mL IVPB     1,250 mg 166.7 mL/hr over 90 Minutes Intravenous Every 24 hours 01/29/18 1633     01/29/18 2000  ceFEPIme (MAXIPIME) 2 g in sodium chloride 0.9 % 100 mL IVPB     2 g 200 mL/hr over 30 Minutes Intravenous Every 12 hours 01/29/18 1628     01/29/18 1215  ceFEPIme (MAXIPIME) 1 g in sodium chloride 0.9 % 100 mL IVPB     1 g 200 mL/hr over 30 Minutes Intravenous  Once 01/29/18 1209 01/29/18 1252   01/29/18 1215  vancomycin (VANCOCIN) IVPB 1000 mg/200 mL premix     1,000 mg 200 mL/hr over 60 Minutes Intravenous  Once 01/29/18 1209 01/29/18 1329       Microbiology results: Recent Results (from the past 240 hour(s))  Blood Culture (routine x 2)     Status: None   Collection Time: 01/22/18 12:25 PM  Result Value Ref Range Status   Specimen Description BLOOD LFOA  Final   Special Requests   Final    BOTTLES DRAWN AEROBIC AND ANAEROBIC Blood Culture adequate volume   Culture   Final    NO GROWTH 5 DAYS Performed at [facility], [address] [place], [place] [address]    Report  Status 01/27/2018 FINAL  Final  Blood Culture (routine x 2)     Status: None   Collection Time: 01/22/18 12:25 PM  Result Value Ref Range Status   Specimen Description BLOOD LAC  Final   Special Requests   Final    BOTTLES DRAWN AEROBIC AND ANAEROBIC Blood Culture adequate volume   Culture   Final    NO GROWTH 5 DAYS Performed at [facility], [address] [place], [place] [address]    Report Status 01/27/2018 FINAL  Final  MRSA PCR Screening     Status: None   Collection Time: 01/29/18  2:36 PM  Result Value Ref Range Status   MRSA by PCR NEGATIVE NEGATIVE Final    Comment:        The GeneXpert MRSA Assay (FDA approved for NASAL specimens only), is one component of a comprehensive MRSA colonization surveillance program. It is not intended to diagnose MRSA infection nor to guide or monitor treatment for MRSA infections. Performed at [facility], [address], [place], [place] [address]      Thank you for allowing pharmacy to be a part of this patient's care.  Mc Christen Laker Thompson 01/29/2018 4:36 PM

## 2018-01-29 NOTE — Progress Notes (Signed)
Advanced care plan.  Purpose of the Encounter: CODE STATUS  Parties in Attendance: Patient  Patient's Decision Capacity: Good  Subjective/Patient's story: came for shortness pf breath, cough, fever.    Objective/Medical story : Being managed for pneumonia, hypoxia, sepsis    Goals of care determination: Discussed cardiac resuscitation and intubation and ventilator if need arises . Patient wants everything done.    CODE STATUS: FULL CODE   Time spent discussing advanced care planning: 16 minutes

## 2018-01-29 NOTE — ED Notes (Signed)
Pt placed on high flow O2 via [place].

## 2018-01-30 LAB — CBC
HCT: 31.3 % — ABNORMAL LOW (ref 35.0–47.0)
Hemoglobin: 10.2 g/dL — ABNORMAL LOW (ref 12.0–16.0)
MCH: 28.3 pg (ref 26.0–34.0)
MCHC: 32.5 g/dL (ref 32.0–36.0)
MCV: 87 fL (ref 80.0–100.0)
PLATELETS: 358 10*3/uL (ref 150–440)
RBC: 3.59 MIL/uL — ABNORMAL LOW (ref 3.80–5.20)
RDW: 14 % (ref 11.5–14.5)
WBC: 18.4 10*3/uL — ABNORMAL HIGH (ref 3.6–11.0)

## 2018-01-30 LAB — BASIC METABOLIC PANEL
Anion gap: 8 (ref 5–15)
BUN: 11 mg/dL (ref 6–20)
CO2: 26 mmol/L (ref 22–32)
CREATININE: 0.48 mg/dL (ref 0.44–1.00)
Calcium: 7.6 mg/dL — ABNORMAL LOW (ref 8.9–10.3)
Chloride: 104 mmol/L (ref 101–111)
GFR calc Af Amer: >60 mL/min (ref >60)
GFR calc non Af Amer: >60 mL/min (ref >60)
GLUCOSE: 101 mg/dL — AB (ref 65–99)
Potassium: 3.4 mmol/L — ABNORMAL LOW (ref 3.5–5.1)
Sodium: 138 mmol/L (ref 135–145)

## 2018-01-30 LAB — PROCALCITONIN: Procalcitonin: 0.2 ng/mL

## 2018-01-30 MED ORDER — POTASSIUM CHLORIDE CRYS ER 20 MEQ PO TBCR
40.0000 meq | EXTENDED_RELEASE_TABLET | Freq: Once | ORAL | Status: AC
  Administered 2018-01-30: 40 meq via ORAL
  Filled 2018-01-30: qty 2

## 2018-01-30 MED ORDER — SODIUM CHLORIDE 0.9 % IV SOLN
INTRAVENOUS | Status: DC
  Administered 2018-01-30 – 2018-01-31 (×3): via INTRAVENOUS

## 2018-01-30 NOTE — Progress Notes (Signed)
[place] [facility] [place] NAME: [name]    MR#:  [id number]  DATE OF BIRTH:  Nov 16, 1956  SUBJECTIVE:   Patient without fevers overnight.  Doing well this morning.  REVIEW OF SYSTEMS:    Review of Systems  Constitutional: Negative for fever, chills weight loss HENT: Negative for ear pain, nosebleeds, congestion, facial swelling, rhinorrhea, neck pain, neck stiffness and ear discharge.   Respiratory: Negative for cough, shortness of breath, wheezing  Cardiovascular: Negative for chest pain, palpitations and leg swelling.  Gastrointestinal: Negative for heartburn, abdominal pain, vomiting, diarrhea or consitpation Genitourinary: Negative for dysuria, urgency, frequency, hematuria Musculoskeletal: Negative for back pain or joint pain Neurological: Negative for dizziness, seizures, syncope, focal weakness,  numbness and headaches.  Hematological: Does not bruise/bleed easily.  Psychiatric/Behavioral: Negative for hallucinations, confusion, dysphoric mood    Tolerating Diet: yes      DRUG ALLERGIES:  No Known Allergies  VITALS:  Blood pressure (!) 100/54, pulse 81, temperature 97.9 F (36.6 C), temperature source Oral, resp. rate 20, height 5' (1.524 m), weight 56.3 kg (124 lb 1.9 oz), SpO2 99 %.  PHYSICAL EXAMINATION:  Constitutional: Appears well-developed and well-nourished. No distress. HENT: Normocephalic. Mar[place] Kitchen Oropharynx is clear and moist.  Eyes: Conjunctivae and EOM are normal. PERRLA, no scleral icterus.  Neck: Normal ROM. Neck supple. No JVD. No tracheal deviation. CVS: RRR, S1/S2 +, no murmurs, no gallops, no carotid bruit.  Pulmonary: Normal respiratory effort without wheezing crackles are audible right base  abdominal: Soft. BS +,  no distension, tenderness, rebound or guarding.  Musculoskeletal: Normal range of motion. No edema and no tenderness.  Neuro: Alert. CN 2-12 grossly intact. No focal deficits. Skin: Skin is warm  and dry. No rash noted. Psychiatric: Normal mood and affect.      LABORATORY PANEL:   CBC Recent Labs  Lab 01/30/18 0629  WBC 18.4*  HGB 10.2*  HCT 31.3*  PLT 358   ------------------------------------------------------------------------------------------------------------------  Chemistries  Recent Labs  Lab 01/27/18 0418 01/29/18 1204 01/30/18 0629  NA  --  132* 138  K  --  3.5 3.4*  CL  --  91* 104  CO2  --  30 26  GLUCOSE  --  103* 101*  BUN  --  23* 11  CREATININE  --  0.81 0.48  CALCIUM  --  8.3* 7.6*  MG 2.3  --   --   AST  --  17  --   ALT  --  9*  --   ALKPHOS  --  58  --   BILITOT  --  0.6  --    ------------------------------------------------------------------------------------------------------------------  Cardiac Enzymes No results for input(s): TROPONINI in the last 168 hours. ------------------------------------------------------------------------------------------------------------------  RADIOLOGY:  Dg Chest Portable 1 View  Result Date: 01/29/2018 CLINICAL DATA:  Sob since yesterday, released from hospital 4 fays ago pneumonia, fever EXAM: PORTABLE CHEST 1 VIEW COMPARISON:  01/26/2018 and 01/22/2018. FINDINGS: There is lung base opacity mostly silhouetting the right and partly silhouetting the left hemidiaphragms. This has increased when compared to the study dated 01/22/2018, likely due to atelectasis. A component of pneumonia is possible at either or both lung bases. No definite pleural effusion. Remainder of the lungs is clear. No pneumothorax. Cardiac silhouette is top-normal in size. No mediastinal or hilar masses. Skeletal structures are demineralized. Old healed rib fractures noted on the left. IMPRESSION: 1. Increased lung base opacities since the most recent prior exams, consistent with atelectasis, pneumonia  or a combination. 2. No evidence of pulmonary edema. Electronically Signed   By: Lajean Manes M.D.   On: 01/29/2018 13:53      ASSESSMENT AND PLAN:   62 year old female with history of COPD with recent discharge from the hospital with COPD exacerbation presents with shortness of breath.  1.  Acute on chronic hypoxic respiratory failure in the setting of HCAP Wean oxygen to baseline 2-3 L  2.HCAP: Continue cefepime Plan to change to oral antibiotics tomorrow if patient continues to show improvement   3.  Sepsis: Patient presented with fever, elevated WBC  and tachycardia Sepsis has resolved Sepsis due to pneumonia CBC has improved  4.  Essential hypertension: Continue Norvasc   5.  Hyperlipidemia: Continue statin  6.  Hypokalemia: Replete and recheck in a.m.  Management plans discussed with the patient and she is in agreement.  CODE STATUS: full  TOTAL TIME TAKING CARE OF THIS PATIENT: 30 minutes.   Physical therapy consultation for discharge planning  POSSIBLE D/C tomorrow, DEPENDING ON CLINICAL CONDITION.   [name] M.D on 01/30/2018 at 11:16 AM  Between 7am to 6pm - Pager - [phone] After 6pm go to [email or web address] - password [email or web address] [place] [facility]  Office  [phone]  CC: Primary care physician; Center, [facility]  Note: This dictation was prepared with Dragon dictation along with smaller phrase technology. Any transcriptional errors that result from this process are unintentional.

## 2018-01-30 NOTE — Evaluation (Signed)
Physical Therapy Evaluation Patient Details Name: ROSELLEN LICHTENBERGER MRN: [id number] DOB: 15-Dec-1955 Today's Date: 01/30/2018   History of Present Illness  Doshia Dalia  is a 62 y.o. female with a known history of COPD, hyperlipidemia, hypertension was discharged on 01/28/2018 after being treated for COPD exacerbation and pneumonia.  Patient went home she felt short of breath and also had a fever patient had a temperature of 101 F.  She has productive cough of yellowish phlegm.  She is currently requiring oxygen by nasal cannula at 6 L baseline was 4 L.  She finished a course of Levaquin antibiotic prior to being discharged.  Patient currently on oral steroids tapering dose no complaints of any chest pain.  Workup in the emergency room chest x-ray revealed bibasilar opacities.  Patient was also hypotensive when she presented to the emergency room, code sepsis was called and patient was given IV fluids and broad-spectrum IV antibiotics.  Clinical Impression  Pt is a 62 year old female who lives in a one story home with her significant other.  Pt is in bed upon PT arrival and able to perform bed mobility independently.  Pt is on 3L of O2 and experiences O2 desat to 87% immediately upon standing. She needs no physical assist to perform STS but does not recover to an O2 level that is safe for ambulation.  Pt presented with 4-4+/5 muscle strength in UE and LE during screening and reports no sensation loss in feet.  Pt will benefit from a pulmonary rehabilitation program and use of a pediatric RW for energy conservation and safety during distance walking. No PT recommended at this time.    Follow Up Recommendations No PT follow up(Pt will benefit from [facility].  Will need valet services and [facility] escort to and from class.)    Equipment Recommendations  Rolling walker with 5" wheels(Pt needs a pediatric RW for energy conservation and safety with distance walking.)    Recommendations for Other Services        Precautions / Restrictions Precautions Precautions: Fall Restrictions Weight Bearing Restrictions: No      Mobility  Bed Mobility Overal bed mobility: Independent                Transfers Overall transfer level: Needs assistance     Sit to Stand: Supervision         General transfer comment: Pt able to perform transfer without physical assist but requires supervision for safety due to O2 desat. to 87%  Ambulation/Gait Ambulation/Gait assistance: (Did not ambulate due to O2 level.)              Stairs            Wheelchair Mobility    Modified Rankin (Stroke Patients Only)       Balance Overall balance assessment: Independent         Standing balance support: Single extremity supported(Pt experiences O2 desat and feels fatigued upon standing.)                                 Pertinent Vitals/Pain Pain Assessment: No/denies pain    Home Living Family/patient expects to be discharged to:: Private residence Living Arrangements: Spouse/significant other Available Help at Discharge: Family;Available PRN/intermittently Type of Home: House Home Access: Stairs to enter Entrance Stairs-Rails: Can reach both Entrance Stairs-Number of Steps: 5 Home Layout: One level  Prior Function Level of Independence: Independent         Comments: Can drive and go grocery shopping     Hand Dominance        Extremity/Trunk Assessment   Upper Extremity Assessment Upper Extremity Assessment: Overall WFL for tasks assessed(Roughly 4/5 bilat)    Lower Extremity Assessment Lower Extremity Assessment: Overall WFL for tasks assessed(Roughly 4+/5 bilat)    Cervical / Trunk Assessment Cervical / Trunk Assessment: Normal  Communication   Communication: No difficulties  Cognition Arousal/Alertness: Awake/alert Behavior During Therapy: WFL for tasks assessed/performed Overall Cognitive Status: Within Functional Limits for  tasks assessed                                        General Comments      Exercises Other Exercises Other Exercises: Pt educated concerning use of RW for energy conservation and benefit of pulmonary rehabilitation.  Pt expressed interest and asked pertinent questions.   Assessment/Plan    PT Assessment Patent does not need any further PT services  PT Problem List         PT Treatment Interventions      PT Goals (Current goals can be found in the Care Plan section)  Acute Rehab PT Goals Patient Stated Goal: To try pulmonary rehab and return to normal level of function. PT Goal Formulation: With patient Time For Goal Achievement: 02/20/18 Potential to Achieve Goals: Good    Frequency     Barriers to discharge        Co-evaluation               AM-PAC PT "6 Clicks" Daily Activity  Outcome Measure Difficulty turning over in bed (including adjusting bedclothes, sheets and blankets)?: None Difficulty moving from lying on back to sitting on the side of the bed? : A Little Difficulty sitting down on and standing up from a chair with arms (e.g., wheelchair, bedside commode, etc,.)?: A Little Help needed moving to and from a bed to chair (including a wheelchair)?: A Little Help needed walking in hospital room?: A Little Help needed climbing 3-5 steps with a railing? : A Little 6 Click Score: 19    End of Session Equipment Utilized During Treatment: Gait belt;Oxygen Activity Tolerance: Treatment limited secondary to medical complications (Comment)(Pt's O2 level still in the 80's while on 3 L.) Patient left: in bed;with bed alarm set;with call bell/phone within reach   PT Visit Diagnosis: Muscle weakness (generalized) (M62.81)    Time: 1440-1500 PT Time Calculation (min) (ACUTE ONLY): 20 min   Charges:   PT Evaluation $PT Eval Low Complexity: 1 Low PT Treatments $Therapeutic Activity: 8-22 mins   PT G Codes:   PT G-Codes **NOT FOR INPATIENT  CLASS** Functional Assessment Tool Used: AM-PAC 6 Clicks Basic Mobility    Roxanne Gates, PT, DPT   Roxanne Gates 01/30/2018, 3:17 PM

## 2018-01-30 NOTE — Care Management Important Message (Signed)
Important Message  Patient Details  Name: [name] MRN: [id number] Date of Birth: August 14, 1956   Medicare Important Message Given:  Yes    Beverly Sessions, RN 01/30/2018, 3:42 PM

## 2018-01-30 NOTE — Progress Notes (Signed)
Pharmacy Antibiotic Note  [name] is a 62 y.o. female admitted on 01/29/2018 with pneumonia.  Pharmacy has been consulted for cefepime dosing.  Recently admitted 01/22/18- 01/28/18 (was on Levaquin inpatient)  Plan:  Day 2- cefepime 2gm iv q12h   Height: 5' (152.4 cm) Weight: 124 lb 1.9 oz (56.3 kg) IBW/kg (Calculated) : 45.5  Temp (24hrs), Avg:99 F (37.2 C), Min:97.9 F (36.6 C), Max:101.3 F (38.5 C)  Recent Labs  Lab 01/24/18 0435 01/26/18 0540 01/29/18 1204 01/29/18 1206 01/29/18 1658 01/30/18 0629  WBC 20.3*  --  29.9*  --   --  18.4*  CREATININE 0.57 0.74 0.81  --   --  0.48  LATICACIDVEN  --   --   --  1.4 1.1  --     Estimated Creatinine Clearance: 59.5 mL/min (by C-G formula based on SCr of 0.48 mg/dL).    No Known Allergies  Antimicrobials this admission: Anti-infectives (From admission, onward)   Start     Dose/Rate Route Frequency Ordered Stop   01/29/18 2200  vancomycin (VANCOCIN) 1,250 mg in sodium chloride 0.9 % 250 mL IVPB  Status:  Discontinued     1,250 mg 166.7 mL/hr over 90 Minutes Intravenous Every 24 hours 01/29/18 1633 01/29/18 1637   01/29/18 2000  ceFEPIme (MAXIPIME) 2 g in sodium chloride 0.9 % 100 mL IVPB     2 g 200 mL/hr over 30 Minutes Intravenous Every 12 hours 01/29/18 1628     01/29/18 1215  ceFEPIme (MAXIPIME) 1 g in sodium chloride 0.9 % 100 mL IVPB     1 g 200 mL/hr over 30 Minutes Intravenous  Once 01/29/18 1209 01/29/18 1252   01/29/18 1215  vancomycin (VANCOCIN) IVPB 1000 mg/200 mL premix     1,000 mg 200 mL/hr over 60 Minutes Intravenous  Once 01/29/18 1209 01/29/18 1329       Microbiology results: Recent Results (from the past 240 hour(s))  Blood Culture (routine x 2)     Status: None   Collection Time: 01/22/18 12:25 PM  Result Value Ref Range Status   Specimen Description BLOOD LFOA  Final   Special Requests   Final    BOTTLES DRAWN AEROBIC AND ANAEROBIC Blood Culture adequate volume   Culture   Final    NO  GROWTH 5 DAYS Performed at [facility], [address] [place], [place] [address]    Report Status 01/27/2018 FINAL  Final  Blood Culture (routine x 2)     Status: None   Collection Time: 01/22/18 12:25 PM  Result Value Ref Range Status   Specimen Description BLOOD LAC  Final   Special Requests   Final    BOTTLES DRAWN AEROBIC AND ANAEROBIC Blood Culture adequate volume   Culture   Final    NO GROWTH 5 DAYS Performed at [facility], [address] [place], [place] [address]    Report Status 01/27/2018 FINAL  Final  Blood Culture (routine x 2)     Status: None (Preliminary result)   Collection Time: 01/29/18 12:04 PM  Result Value Ref Range Status   Specimen Description BLOOD RAC  Final   Special Requests   Final    BOTTLES DRAWN AEROBIC AND ANAEROBIC Blood Culture adequate volume   Culture   Final    NO GROWTH < 24 HOURS Performed at [facility], [address], [place], [place] [address]    Report Status PENDING  Incomplete  Blood Culture (routine x 2)  Status: None (Preliminary result)   Collection Time: 01/29/18 12:04 PM  Result Value Ref Range Status   Specimen Description BLOOD RIGHT ARM  Final   Special Requests   Final    BOTTLES DRAWN AEROBIC AND ANAEROBIC Blood Culture adequate volume   Culture   Final    NO GROWTH < 24 HOURS Performed at [facility], [address] [place], [place] [address]    Report Status PENDING  Incomplete  MRSA PCR Screening     Status: None   Collection Time: 01/29/18  2:36 PM  Result Value Ref Range Status   MRSA by PCR NEGATIVE NEGATIVE Final    Comment:        The GeneXpert MRSA Assay (FDA approved for NASAL specimens only), is one component of a comprehensive MRSA colonization surveillance program. It is not intended to diagnose MRSA infection nor to guide or monitor treatment for MRSA infections. Performed at [facility], [address], [place], [place] [address]       Thank you for allowing pharmacy to be a part of this patient's care.  Marsean Elkhatib A 01/30/2018 12:05 PM

## 2018-01-31 LAB — URINALYSIS, ROUTINE W REFLEX MICROSCOPIC
Bilirubin Urine: NEGATIVE
GLUCOSE, UA: NEGATIVE mg/dL
HGB URINE DIPSTICK: NEGATIVE
Ketones, ur: NEGATIVE mg/dL
Leukocytes, UA: NEGATIVE
Nitrite: NEGATIVE
Protein, ur: NEGATIVE mg/dL
SPECIFIC GRAVITY, URINE: 1.003 — AB (ref 1.005–1.030)
pH: 7 (ref 5.0–8.0)

## 2018-01-31 LAB — BASIC METABOLIC PANEL
Anion gap: 7 (ref 5–15)
BUN: 6 mg/dL (ref 6–20)
CALCIUM: 7.4 mg/dL — AB (ref 8.9–10.3)
CO2: 30 mmol/L (ref 22–32)
CREATININE: 0.51 mg/dL (ref 0.44–1.00)
Chloride: 95 mmol/L — ABNORMAL LOW (ref 101–111)
GFR calc non Af Amer: >60 mL/min (ref >60)
Glucose, Bld: 97 mg/dL (ref 65–99)
Potassium: 3.5 mmol/L (ref 3.5–5.1)
SODIUM: 132 mmol/L — AB (ref 135–145)

## 2018-01-31 LAB — URINE CULTURE: Culture: <10000 — AB

## 2018-01-31 LAB — CBC
HCT: 29.5 % — ABNORMAL LOW (ref 35.0–47.0)
Hemoglobin: 9.6 g/dL — ABNORMAL LOW (ref 12.0–16.0)
MCH: 27.7 pg (ref 26.0–34.0)
MCHC: 32.4 g/dL (ref 32.0–36.0)
MCV: 85.6 fL (ref 80.0–100.0)
PLATELETS: 313 10*3/uL (ref 150–440)
RBC: 3.45 MIL/uL — ABNORMAL LOW (ref 3.80–5.20)
RDW: 13.5 % (ref 11.5–14.5)
WBC: 15.6 10*3/uL — AB (ref 3.6–11.0)

## 2018-01-31 LAB — PROCALCITONIN: PROCALCITONIN: 0.19 ng/mL

## 2018-01-31 MED ORDER — ALUM & MAG HYDROXIDE-SIMETH 200-200-20 MG/5ML PO SUSP
30.0000 mL | ORAL | Status: DC | PRN
  Administered 2018-01-31: 30 mL via ORAL
  Filled 2018-01-31: qty 30

## 2018-01-31 NOTE — Progress Notes (Signed)
[name] at [place] NAME: [name]    MR#:  [id number]  DATE OF BIRTH:  [birth year]  SUBJECTIVE:  She doing well however had a fever this morning.  Her symptoms have much improved.  Denies shortness of breath or wheezing.  Denies urinary symptoms.   REVIEW OF SYSTEMS:    Review of Systems  Constitutional: Negative for fever, chills weight loss HENT: Negative for ear pain, nosebleeds, congestion, facial swelling, rhinorrhea, neck pain, neck stiffness and ear discharge.   Respiratory: Negative for cough, shortness of breath, wheezing  Cardiovascular: Negative for chest pain, palpitations and leg swelling.  Gastrointestinal: Negative for heartburn, abdominal pain, vomiting, diarrhea or consitpation Genitourinary: Negative for dysuria, urgency, frequency, hematuria Musculoskeletal: Negative for back pain or joint pain Neurological: Negative for dizziness, seizures, syncope, focal weakness,  numbness and headaches.  Hematological: Does not bruise/bleed easily.  Psychiatric/Behavioral: Negative for hallucinations, confusion, dysphoric mood    Tolerating Diet: yes      DRUG ALLERGIES:  No Known Allergies  VITALS:  Blood pressure 112/61, pulse 100, temperature 98.4 F (36.9 C), temperature source Oral, resp. rate 20, height 5' (1.524 m), weight 56.3 kg (124 lb 1.9 oz), SpO2 91 %.  PHYSICAL EXAMINATION:  Constitutional: Appears well-developed and well-nourished. No distress. HENT: Normocephalic. Marland Kitchen Oropharynx is clear and moist.  Eyes: Conjunctivae and EOM are normal. PERRLA, no scleral icterus.  Neck: Normal ROM. Neck supple. No JVD. No tracheal deviation. CVS: RRR, S1/S2 +, no murmurs, no gallops, no carotid bruit.  Pulmonary: Normal respiratory effort with bilateral crackles at bases no wheezing   abdominal: Soft. BS +,  no distension, tenderness, rebound or guarding.  Musculoskeletal: Normal range of motion. No edema and no  tenderness.  Neuro: Alert. CN 2-12 grossly intact. No focal deficits. Skin: Skin is warm and dry. No rash noted. Psychiatric: Normal mood and affect.      LABORATORY PANEL:   CBC Recent Labs  Lab 01/31/18 0520  WBC 15.6*  HGB 9.6*  HCT 29.5*  PLT 313   ------------------------------------------------------------------------------------------------------------------  Chemistries  Recent Labs  Lab 01/27/18 0418 01/29/18 1204  01/31/18 0520  NA  --  132*   < > 132*  K  --  3.5   < > 3.5  CL  --  91*   < > 95*  CO2  --  30   < > 30  GLUCOSE  --  103*   < > 97  BUN  --  23*   < > 6  CREATININE  --  0.81   < > 0.51  CALCIUM  --  8.3*   < > 7.4*  MG 2.3  --   --   --   AST  --  17  --   --   ALT  --  9*  --   --   ALKPHOS  --  58  --   --   BILITOT  --  0.6  --   --    < > = values in this interval not displayed.   ------------------------------------------------------------------------------------------------------------------  Cardiac Enzymes No results for input(s): TROPONINI in the last 168 hours. ------------------------------------------------------------------------------------------------------------------  RADIOLOGY:  Dg Chest Portable 1 View  Result Date: 01/29/2018 CLINICAL DATA:  Sob since yesterday, released from hospital 4 fays ago pneumonia, fever EXAM: PORTABLE CHEST 1 VIEW COMPARISON:  01/26/2018 and 01/22/2018. FINDINGS: There is lung base opacity mostly silhouetting the right and partly silhouetting the left hemidiaphragms. This  has increased when compared to the study dated 01/22/2018, likely due to atelectasis. A component of pneumonia is possible at either or both lung bases. No definite pleural effusion. Remainder of the lungs is clear. No pneumothorax. Cardiac silhouette is top-normal in size. No mediastinal or hilar masses. Skeletal structures are demineralized. Old healed rib fractures noted on the left. IMPRESSION: 1. Increased lung base  opacities since the most recent prior exams, consistent with atelectasis, pneumonia or a combination. 2. No evidence of pulmonary edema. Electronically Signed   By: Lajean Manes M.D.   On: 01/29/2018 13:53     ASSESSMENT AND PLAN:   62 year old female with history of COPD with recent discharge from the hospital with COPD exacerbation presents with shortness of breath.  1.  Acute on chronic hypoxic respiratory failure in the setting of HCAP She has been weaned to her baseline oxygen of 2-3 L  2.HCAP: Continue cefepime Check UA due to fever Order incentive spirometer   3.  Sepsis: Patient presented with fever, elevated WBC  and tachycardia Sepsis has resolved Sepsis due to pneumonia CBC has improved  4.  Essential hypertension: Continue Norvasc   5.  Hyperlipidemia: Continue statin  6.  Hypokalemia: Repleted  7.  Hyponatremia in the setting of pneumonia  Management plans discussed with the patient and she is in agreement.  CODE STATUS: full  TOTAL TIME TAKING CARE OF THIS PATIENT: 38minutes.   Physical therapy consultation does not recommend further outpatient or home health care   POSSIBLE D/C tomorrow, DEPENDING ON CLINICAL CONDITION.   Maame Dack M.D on 01/31/2018 at 10:03 AM  Between 7am to 6pm - Pager - [phone] After 6pm go to [email or web address] - password [email or web address] [facility]  Office  [phone]  CC: Primary care physician; Center, [facility]  Note: This dictation was prepared with Dragon dictation along with smaller phrase technology. Any transcriptional errors that result from this process are unintentional.

## 2018-01-31 NOTE — Plan of Care (Signed)
Pt continues to require 4L of oxygen. Ambulating to [facility]

## 2018-02-01 LAB — BASIC METABOLIC PANEL
ANION GAP: 10 (ref 5–15)
BUN: 8 mg/dL (ref 6–20)
CO2: 32 mmol/L (ref 22–32)
CREATININE: 0.52 mg/dL (ref 0.44–1.00)
Calcium: 7.7 mg/dL — ABNORMAL LOW (ref 8.9–10.3)
Chloride: 95 mmol/L — ABNORMAL LOW (ref 101–111)
GFR calc Af Amer: >60 mL/min (ref >60)
GFR calc non Af Amer: >60 mL/min (ref >60)
GLUCOSE: 107 mg/dL — AB (ref 65–99)
Potassium: 3.4 mmol/L — ABNORMAL LOW (ref 3.5–5.1)
Sodium: 137 mmol/L (ref 135–145)

## 2018-02-01 MED ORDER — AZITHROMYCIN 500 MG PO TABS: 500.0000 mg | ORAL_TABLET | Freq: Every day | ORAL | 0 refills | Status: AC

## 2018-02-01 MED ORDER — BISACODYL 10 MG RE SUPP
10.0000 mg | Freq: Once | RECTAL | Status: AC
  Administered 2018-02-01: 10 mg via RECTAL
  Filled 2018-02-01: qty 1

## 2018-02-01 MED ORDER — CEFUROXIME AXETIL 500 MG PO TABS: 500.0000 mg | ORAL_TABLET | Freq: Two times a day (BID) | ORAL | 0 refills | Status: DC

## 2018-02-01 MED ORDER — POTASSIUM CHLORIDE CRYS ER 20 MEQ PO TBCR: 40.0000 meq | EXTENDED_RELEASE_TABLET | Freq: Once | ORAL | Status: DC

## 2018-02-01 NOTE — Progress Notes (Signed)
[name] to be D/C'd home per MD order.  Discussed prescriptions and follow up appointments with the patient. Prescriptions given to patient, medication list explained in detail. Pt verbalized understanding.  Allergies as of 02/01/2018   No Known Allergies     Medication List    STOP taking these medications   amLODipine 5 MG tablet Commonly known as:  NORVASC   predniSONE 10 MG tablet Commonly known as:  DELTASONE     TAKE these medications   albuterol 108 (90 Base) MCG/ACT inhaler Commonly known as:  PROVENTIL HFA;VENTOLIN HFA Inhale 4-6 puffs into the lungs every 6 (six) hours as needed for wheezing or shortness of breath.   aspirin EC 81 MG tablet Take 81 mg by mouth daily.   azithromycin 500 MG tablet Commonly known as:  ZITHROMAX Take 1 tablet (500 mg total) by mouth daily for 3 days. Take 1 tablet daily for 3 days.   BREO ELLIPTA 100-25 MCG/INH Aepb Generic drug:  fluticasone furoate-vilanterol Inhale 1 puff into the lungs daily.   cefUROXime 500 MG tablet Commonly known as:  CEFTIN Take 1 tablet (500 mg total) by mouth 2 (two) times daily with Boyd meal.   ferrous sulfate 325 (65 FE) MG tablet Take 325 mg by mouth 2 (two) times daily with Boyd meal.   gabapentin 300 MG capsule Commonly known as:  NEURONTIN Take 600 mg by mouth 3 (three) times daily.   ipratropium-albuterol 0.5-2.5 (3) MG/3ML Soln Commonly known as:  DUONEB Take 0.5 mg by nebulization every 6 (six) hours as needed.   nortriptyline 25 MG capsule Commonly known as:  PAMELOR Take 25 mg by mouth at bedtime.   OXYGEN Inhale 2 L into the lungs continuous.   pantoprazole 40 MG tablet Commonly known as:  PROTONIX Take 1 tablet (40 mg total) by mouth daily.   simvastatin 40 MG tablet Commonly known as:  ZOCOR Take 40 mg by mouth at bedtime.   tiotropium 18 MCG inhalation capsule Commonly known as:  SPIRIVA Place 18 mcg into inhaler and inhale daily.            Durable Medical  Equipment  (From admission, onward)        Start     Ordered   01/31/18 1003  For home use only DME Walker rolling  Once    Comments:  Needs pediatric rolling walker  Question:  Patient needs Boyd walker to treat with the following condition  Answer:  Weakness   01/31/18 1003      Vitals:   02/01/18 0431 02/01/18 0834  BP: (!) 96/54   Pulse: 84   Resp: 20   Temp: 98.2 F (36.8 C)   SpO2: 95% 90%    Skin clean, dry and intact without evidence of skin break down, no evidence of skin tears noted. IV catheter discontinued intact. Site without signs and symptoms of complications. Dressing and pressure applied. Pt denies pain at this time. No complaints noted.  An After Visit Summary was printed and given to the patient. Patient escorted via [place], and D/C home via private auto.  [name]

## 2018-02-01 NOTE — Care Management (Signed)
Notified [facility] that patient discharged.  Orders for nursing and PT.  Patient had copd protocol signed at discharge 01/28/2018

## 2018-02-01 NOTE — Discharge Summary (Signed)
[facility] NAME: [name]    MR#:  [id number]  DATE OF BIRTH:  07-Dec-1955  DATE OF ADMISSION:  01/29/2018 ADMITTING PHYSICIAN: Saundra Shelling, MD  DATE OF DISCHARGE: 02/01/2018  PRIMARY CARE PHYSICIAN: [facility]    ADMISSION DIAGNOSIS:  Healthcare-associated pneumonia [J18.9] Acute respiratory failure with hypoxia ([place]) [J96.01] Sepsis, due to unspecified organism ([place]) [A41.9]  DISCHARGE DIAGNOSIS:  Active Problems:   HCAP (healthcare-associated pneumonia)   SECONDARY DIAGNOSIS:   Past Medical History:  Diagnosis Date  . COPD (chronic obstructive pulmonary disease) ([place])   . High cholesterol   . Hypertension     HOSPITAL COURSE:   62 year old female with history of COPD with recent discharge from the hospital with COPD exacerbation presents with shortness of breath.  1.  Acute on chronic hypoxic respiratory failure in the setting of HCAP She has been weaned to her baseline oxygen of 2-3 L  2.HCAP: She was on cefepime on the hospital.  She will be discharged on Ceftin and azithromycin.  She was recently on Levaquin for 7 days and therefore is not being this charge on this antibiotic.   3.  Sepsis: Patient presented with fever, elevated WBC  and tachycardia Sepsis has resolved Sepsis due to pneumonia CBC has improved  4.  Essential hypertension: Her blood pressure is low/normal and so I have discontinued Norvasc for now   5.  Hyperlipidemia: Continue statin  6.  Hypokalemia: Repleted prior to discharge  7.  Hyponatremia in the setting of pneumonia: Which is resolved     DISCHARGE CONDITIONS AND DIET:   Stable for discharge on heart healthy diet  CONSULTS OBTAINED:    DRUG ALLERGIES:  No Known Allergies  DISCHARGE MEDICATIONS:   Allergies as of 02/01/2018   No Known Allergies     Medication List    STOP taking these medications   amLODipine 5 MG tablet Commonly  known as:  NORVASC   predniSONE 10 MG tablet Commonly known as:  DELTASONE     TAKE these medications   albuterol 108 (90 Base) MCG/ACT inhaler Commonly known as:  PROVENTIL HFA;VENTOLIN HFA Inhale 4-6 puffs into the lungs every 6 (six) hours as needed for wheezing or shortness of breath.   aspirin EC 81 MG tablet Take 81 mg by mouth daily.   azithromycin 500 MG tablet Commonly known as:  ZITHROMAX Take 1 tablet (500 mg total) by mouth daily for 3 days. Take 1 tablet daily for 3 days.   BREO ELLIPTA 100-25 MCG/INH Aepb Generic drug:  fluticasone furoate-vilanterol Inhale 1 puff into the lungs daily.   cefUROXime 500 MG tablet Commonly known as:  CEFTIN Take 1 tablet (500 mg total) by mouth 2 (two) times daily with a meal.   ferrous sulfate 325 (65 FE) MG tablet Take 325 mg by mouth 2 (two) times daily with a meal.   gabapentin 300 MG capsule Commonly known as:  NEURONTIN Take 600 mg by mouth 3 (three) times daily.   ipratropium-albuterol 0.5-2.5 (3) MG/3ML Soln Commonly known as:  DUONEB Take 0.5 mg by nebulization every 6 (six) hours as needed.   nortriptyline 25 MG capsule Commonly known as:  PAMELOR Take 25 mg by mouth at bedtime.   OXYGEN Inhale 2 L into the lungs continuous.   pantoprazole 40 MG tablet Commonly known as:  PROTONIX Take 1 tablet (40 mg total) by mouth daily.   simvastatin 40 MG tablet Commonly known as:  ZOCOR Take 40 mg by mouth at bedtime.   tiotropium 18 MCG inhalation capsule Commonly known as:  SPIRIVA Place 18 mcg into inhaler and inhale daily.            Durable Medical Equipment  (From admission, onward)        Start     Ordered   01/31/18 1003  For home use only DME Walker rolling  Once    Comments:  Needs pediatric rolling walker  Question:  Patient needs a walker to treat with the following condition  Answer:  Weakness   01/31/18 1003        Today   CHIEF COMPLAINT:  Very well this morning.  Denies  shortness of breath or chest pain.   VITAL SIGNS:  Blood pressure (!) 96/54, pulse 84, temperature 98.2 F (36.8 C), temperature source Oral, resp. rate 20, height 5' (1.524 m), weight 56.3 kg (124 lb 1.9 oz), SpO2 90 %.   REVIEW OF SYSTEMS:  Review of Systems  Constitutional: Negative.  Negative for chills, fever and malaise/fatigue.  HENT: Negative.  Negative for ear discharge, ear pain, hearing loss, nosebleeds and sore throat.   Eyes: Negative.  Negative for blurred vision and pain.  Respiratory: Negative.  Negative for cough, hemoptysis, shortness of breath and wheezing.   Cardiovascular: Negative.  Negative for chest pain, palpitations and leg swelling.  Gastrointestinal: Negative.  Negative for abdominal pain, blood in stool, diarrhea, nausea and vomiting.  Genitourinary: Negative.  Negative for dysuria.  Musculoskeletal: Negative.  Negative for back pain.  Skin: Negative.   Neurological: Negative for dizziness, tremors, speech change, focal weakness, seizures and headaches.  Endo/Heme/Allergies: Negative.  Does not bruise/bleed easily.  Psychiatric/Behavioral: Negative.  Negative for depression, hallucinations and suicidal ideas.     PHYSICAL EXAMINATION:  GENERAL:  62 y.o.-year-old patient lying in the bed with no acute distress.  NECK:  Supple, no jugular venous distention. No thyroid enlargement, no tenderness.  LUNGS: Bilateral crackles at the bases without wheezing or rhonchi CARDIOVASCULAR: S1, S2 normal. No murmurs, rubs, or gallops.  ABDOMEN: Soft, non-tender, non-distended. Bowel sounds present. No organomegaly or mass.  EXTREMITIES: No pedal edema, cyanosis, or clubbing.  PSYCHIATRIC: The patient is alert and oriented x 3.  SKIN: No obvious rash, lesion, or ulcer.   DATA REVIEW:   CBC Recent Labs  Lab 01/31/18 0520  WBC 15.6*  HGB 9.6*  HCT 29.5*  PLT 313    Chemistries  Recent Labs  Lab 01/27/18 0418 01/29/18 1204  02/01/18 0453  NA  --  132*    < > 137  K  --  3.5   < > 3.4*  CL  --  91*   < > 95*  CO2  --  30   < > 32  GLUCOSE  --  103*   < > 107*  BUN  --  23*   < > 8  CREATININE  --  0.81   < > 0.52  CALCIUM  --  8.3*   < > 7.7*  MG 2.3  --   --   --   AST  --  17  --   --   ALT  --  9*  --   --   ALKPHOS  --  58  --   --   BILITOT  --  0.6  --   --    < > = values in this interval not displayed.    Cardiac Enzymes  No results for input(s): TROPONINI in the last 168 hours.  Microbiology Results  @MICRORSLT48 @  RADIOLOGY:  No results found.    Allergies as of 02/01/2018   No Known Allergies     Medication List    STOP taking these medications   amLODipine 5 MG tablet Commonly known as:  NORVASC   predniSONE 10 MG tablet Commonly known as:  DELTASONE     TAKE these medications   albuterol 108 (90 Base) MCG/ACT inhaler Commonly known as:  PROVENTIL HFA;VENTOLIN HFA Inhale 4-6 puffs into the lungs every 6 (six) hours as needed for wheezing or shortness of breath.   aspirin EC 81 MG tablet Take 81 mg by mouth daily.   azithromycin 500 MG tablet Commonly known as:  ZITHROMAX Take 1 tablet (500 mg total) by mouth daily for 3 days. Take 1 tablet daily for 3 days.   BREO ELLIPTA 100-25 MCG/INH Aepb Generic drug:  fluticasone furoate-vilanterol Inhale 1 puff into the lungs daily.   cefUROXime 500 MG tablet Commonly known as:  CEFTIN Take 1 tablet (500 mg total) by mouth 2 (two) times daily with a meal.   ferrous sulfate 325 (65 FE) MG tablet Take 325 mg by mouth 2 (two) times daily with a meal.   gabapentin 300 MG capsule Commonly known as:  NEURONTIN Take 600 mg by mouth 3 (three) times daily.   ipratropium-albuterol 0.5-2.5 (3) MG/3ML Soln Commonly known as:  DUONEB Take 0.5 mg by nebulization every 6 (six) hours as needed.   nortriptyline 25 MG capsule Commonly known as:  PAMELOR Take 25 mg by mouth at bedtime.   OXYGEN Inhale 2 L into the lungs continuous.   pantoprazole 40 MG  tablet Commonly known as:  PROTONIX Take 1 tablet (40 mg total) by mouth daily.   simvastatin 40 MG tablet Commonly known as:  ZOCOR Take 40 mg by mouth at bedtime.   tiotropium 18 MCG inhalation capsule Commonly known as:  SPIRIVA Place 18 mcg into inhaler and inhale daily.            Durable Medical Equipment  (From admission, onward)        Start     Ordered   01/31/18 1003  For home use only DME Walker rolling  Once    Comments:  Needs pediatric rolling walker  Question:  Patient needs a walker to treat with the following condition  Answer:  Weakness   01/31/18 1003          Management plans discussed with the patient and she is in agreement. Stable for discharge home  Patient should follow up with pcp  CODE STATUS:     Code Status Orders  (From admission, onward)        Start     Ordered   01/29/18 1557  Full code  Continuous     01/29/18 1556    Code Status History    Date Active Date Inactive Code Status Order ID Comments User Context   01/22/2018 14:24 01/28/2018 17:30 Full Code [id number]  Epifanio Lesches, MD ED   01/18/2016 17:46 01/23/2016 12:43 Full Code [id number]  Nita Sells, MD Inpatient   11/06/2015 02:02 11/07/2015 12:47 Full Code [id number]  Oswald Hillock, MD Inpatient      TOTAL TIME TAKING CARE OF THIS PATIENT: 38 minutes.    Note: This dictation was prepared with Dragon dictation along with smaller phrase technology. Any transcriptional errors that result from this process are unintentional.  Tyquez Hollibaugh,  Rabiah Goeser M.D on 02/01/2018 at 10:36 AM  Between 7am to 6pm - Pager - [phone] After 6pm go to [email or web address] - password [email or web address] [facility]  Office  [phone]  CC: Primary care physician; [facility], [facility]

## 2018-02-02 ENCOUNTER — Telehealth: Payer: Self-pay

## 2018-02-02 NOTE — Telephone Encounter (Signed)
Flagged on EMMI report for not having discharge papers.  Called and spoke with patient.  She states she does have her papers for both her recent admissions.  Has no further questions or concerns at this time.  Thanked patient for her time and informed her that she would receive one more automated call in a few days checking on her a final time.

## 2018-02-03 LAB — CULTURE, BLOOD (ROUTINE X 2)
CULTURE: NO GROWTH
Culture: NO GROWTH
SPECIAL REQUESTS: ADEQUATE
Special Requests: ADEQUATE

## 2018-05-11 ENCOUNTER — Emergency Department (HOSPITAL_COMMUNITY): Payer: Medicare HMO

## 2018-05-11 ENCOUNTER — Emergency Department (HOSPITAL_COMMUNITY)
Admission: EM | Admit: 2018-05-11 | Discharge: 2018-05-11 | Disposition: A | Discharge status: Home / Self Care | Payer: Medicare HMO | Attending: Emergency Medicine | Admitting: Emergency Medicine

## 2018-05-11 ENCOUNTER — Encounter (HOSPITAL_COMMUNITY): Payer: Self-pay | Admitting: Emergency Medicine

## 2018-05-11 ENCOUNTER — Other Ambulatory Visit: Payer: Self-pay

## 2018-05-11 DIAGNOSIS — I10 Essential (primary) hypertension: Secondary | ICD-10-CM | POA: Diagnosis not present

## 2018-05-11 DIAGNOSIS — Z79899 Other long term (current) drug therapy: Secondary | ICD-10-CM | POA: Diagnosis not present

## 2018-05-11 DIAGNOSIS — Z7982 Long term (current) use of aspirin: Secondary | ICD-10-CM | POA: Insufficient documentation

## 2018-05-11 DIAGNOSIS — J441 Chronic obstructive pulmonary disease with (acute) exacerbation: Secondary | ICD-10-CM | POA: Diagnosis not present

## 2018-05-11 DIAGNOSIS — R0602 Shortness of breath: Secondary | ICD-10-CM | POA: Diagnosis present

## 2018-05-11 DIAGNOSIS — Z79891 Long term (current) use of opiate analgesic: Secondary | ICD-10-CM | POA: Diagnosis not present

## 2018-05-11 LAB — CBC WITH DIFFERENTIAL/PLATELET
BASOS PCT: 1 %
Basophils Absolute: 0 10*3/uL (ref 0.0–0.1)
EOS ABS: 0.1 10*3/uL (ref 0.0–0.7)
EOS PCT: 2 %
HCT: 38.1 % (ref 36.0–46.0)
Hemoglobin: 12 g/dL (ref 12.0–15.0)
Lymphocytes Relative: 22 %
Lymphs Abs: 1.2 10*3/uL (ref 0.7–4.0)
MCH: 27.5 pg (ref 26.0–34.0)
MCHC: 31.5 g/dL (ref 30.0–36.0)
MCV: 87.4 fL (ref 78.0–100.0)
MONO ABS: 0.4 10*3/uL (ref 0.1–1.0)
Monocytes Relative: 8 %
Neutro Abs: 3.7 10*3/uL (ref 1.7–7.7)
Neutrophils Relative %: 67 %
PLATELETS: 260 10*3/uL (ref 150–400)
RBC: 4.36 MIL/uL (ref 3.87–5.11)
RDW: 13.2 % (ref 11.5–15.5)
WBC: 5.5 10*3/uL (ref 4.0–10.5)

## 2018-05-11 LAB — BASIC METABOLIC PANEL
Anion gap: 9 (ref 5–15)
BUN: 8 mg/dL (ref 6–20)
CALCIUM: 8.7 mg/dL — AB (ref 8.9–10.3)
CO2: 31 mmol/L (ref 22–32)
CREATININE: 0.55 mg/dL (ref 0.44–1.00)
Chloride: 102 mmol/L (ref 101–111)
GFR calc Af Amer: >60 mL/min (ref >60)
GLUCOSE: 121 mg/dL — AB (ref 65–99)
POTASSIUM: 3 mmol/L — AB (ref 3.5–5.1)
SODIUM: 142 mmol/L (ref 135–145)

## 2018-05-11 MED ORDER — PREDNISONE 10 MG PO TABS: 10.0000 mg | ORAL_TABLET | Freq: Every day | ORAL | 0 refills | Status: DC

## 2018-05-11 MED ORDER — ALBUTEROL SULFATE (2.5 MG/3ML) 0.083% IN NEBU
5.0000 mg | INHALATION_SOLUTION | Freq: Once | RESPIRATORY_TRACT | Status: AC
  Administered 2018-05-11: 5 mg via RESPIRATORY_TRACT
  Filled 2018-05-11: qty 6

## 2018-05-11 MED ORDER — LEVOFLOXACIN 500 MG PO TABS: 500.0000 mg | ORAL_TABLET | Freq: Every day | ORAL | 0 refills | Status: DC

## 2018-05-11 NOTE — ED Provider Notes (Signed)
[facility] EMERGENCY DEPARTMENT Provider Note   CSN: [id number] Arrival date & time: 05/11/18  1244     History   Chief Complaint Chief Complaint  Patient presents with  . Shortness of Breath    HPI [name] is a 62 y.o. female.  Patient with known history of COPD presents with worsening cough and dyspnea.  She is normally on 3 L of nasal cannula.  When she is resting, symptoms are stable.  Her dyspnea worsens with exertion.  Quit smoking 5 years ago.  She has been using her nebulizer treatments at home.  She received IV steroids via EMS.  Severity is moderate.  No fever, chills, rusty sputum.     Past Medical History:  Diagnosis Date  . COPD (chronic obstructive pulmonary disease) ([place])   . High cholesterol   . Hypertension     Patient Active Problem List   Diagnosis Date Noted  . HCAP (healthcare-associated pneumonia) 01/29/2018  . Acute on chronic respiratory failure ([place]) 01/22/2018  . CAP (community acquired pneumonia) 01/18/2016  . Anemia, iron deficiency 11/07/2015  . Chronic respiratory failure ([place]) 11/07/2015  . Acute on chronic respiratory failure with hypoxia ([place]) 11/07/2015  . Hyperlipidemia 11/07/2015  . COPD with acute exacerbation ([place]) 11/06/2015  . Hypertension 11/06/2015    Past Surgical History:  Procedure Laterality Date  . HALLUX VALGUS BASE WEDGE Right 06/09/2015   Procedure: Base wedge osteotomy with modified McBride right foot ;  Surgeon: Sharlotte Alamo, MD;  Location: [facility] ORS;  Service: Podiatry;  Laterality: Right;     OB History   None      Home Medications    Prior to Admission medications   Medication Sig Start Date End Date Taking? Authorizing Provider  albuterol (PROVENTIL HFA;VENTOLIN HFA) 108 (90 BASE) MCG/ACT inhaler Inhale 4-6 puffs into the lungs every 6 (six) hours as needed for wheezing or shortness of breath.   Yes [provider]  aspirin EC 81 MG tablet Take 81 mg by mouth daily.   Yes [provider]  ferrous sulfate 325 (65 FE) MG tablet Take 325 mg by mouth 2 (two) times daily with a meal.    Yes [provider]  fluticasone furoate-vilanterol (BREO ELLIPTA) 100-25 MCG/INH AEPB Inhale 1 puff into the lungs daily.   Yes [provider]  gabapentin (NEURONTIN) 300 MG capsule Take 600 mg by mouth 3 (three) times daily.   Yes [provider]  ipratropium (ATROVENT HFA) 17 MCG/ACT inhaler Inhale 2 puffs into the lungs every 6 (six) hours as needed for wheezing.   Yes [provider]  ipratropium-albuterol (DUONEB) 0.5-2.5 (3) MG/3ML SOLN Take 0.5 mg by nebulization every 6 (six) hours as needed. 08/03/12  Yes [provider]  nortriptyline (PAMELOR) 25 MG capsule Take 25 mg by mouth at bedtime.   Yes [provider]  OXYGEN Inhale 3 L into the lungs continuous.    Yes [provider]  pantoprazole (PROTONIX) 40 MG tablet Take 1 tablet (40 mg total) by mouth daily. 01/28/18  Yes Gladstone Lighter, MD  simvastatin (ZOCOR) 40 MG tablet Take 40 mg by mouth at bedtime.   Yes [provider]  tiotropium (SPIRIVA) 18 MCG inhalation capsule Place 18 mcg into inhaler and inhale daily.   Yes [provider]  cefUROXime (CEFTIN) 500 MG tablet Take 1 tablet (500 mg total) by mouth 2 (two) times daily with a meal. Patient not taking: Reported on 05/11/2018 02/01/18   Benjie Karvonen,  [place], MD  levofloxacin (LEVAQUIN) 500 MG tablet Take 1 tablet (500 mg total) by mouth daily. 05/11/18   Nat Christen, MD  predniSONE (DELTASONE) 10 MG tablet Take 1 tablet (10 mg total) by mouth daily with breakfast. 3 tablets for 4 days, 2 tablets for 4 days, 1 tab for 4 days 05/11/18   Nat Christen, MD    Family History Family History  Problem Relation Age of Onset  . Alzheimer's disease Mother   . COPD Father   . Breast cancer Neg Hx     Social History Social History   Tobacco Use  . Smoking status: Former Smoker    Last attempt to quit:  11/04/2013    Years since quitting: 4.5  . Smokeless tobacco: Never Used  Substance Use Topics  . Alcohol use: No  . Drug use: No     Allergies   Patient has no known allergies.   Review of Systems Review of Systems  All other systems reviewed and are negative.    Physical Exam Updated Vital Signs BP 140/72   Pulse (!) 109   Temp 98.1 F (36.7 C) (Oral)   Resp 15   SpO2 94%   Physical Exam  Constitutional: She is oriented to person, place, and time.  On nasal oxygen.  No acute distress.  HENT:  Head: Normocephalic and atraumatic.  Eyes: Conjunctivae are normal.  Neck: Neck supple.  Cardiovascular: Normal rate and regular rhythm.  Pulmonary/Chest: Effort normal and breath sounds normal.  No respiratory distress; scattered rhonchi and wheezes.  Abdominal: Soft. Bowel sounds are normal.  Musculoskeletal: Normal range of motion.  Neurological: She is alert and oriented to person, place, and time.  Skin: Skin is warm and dry.  Psychiatric: She has a normal mood and affect. Her behavior is normal.  Nursing note and vitals reviewed.    ED Treatments / Results  Labs (all labs ordered are listed, but only abnormal results are displayed) Labs Reviewed  BASIC METABOLIC PANEL - Abnormal; Notable for the following components:      Result Value   Potassium 3.0 (*)    Glucose, Bld 121 (*)    Calcium 8.7 (*)    All other components within normal limits  CBC WITH DIFFERENTIAL/PLATELET    EKG EKG Interpretation  Date/Time:  Monday May 11 2018 12:53:57 EDT Ventricular Rate:  98 PR Interval:    QRS Duration: 96 QT Interval:  356 QTC Calculation: 455 R Axis:   100 Text Interpretation:  Sinus rhythm Right axis deviation Low voltage, precordial leads Confirmed by Nat Christen [phone]) on 05/11/2018 1:35:46 PM   Radiology Dg Chest Portable 1 View  Result Date: 05/11/2018 CLINICAL DATA:  62 y/o  F; history shortness of breath for 2-3 days. EXAM: PORTABLE CHEST 1 VIEW  COMPARISON:  01/29/2018 chest radiograph FINDINGS: Stable normal cardiac silhouette given projection and technique. Aortic atherosclerosis with calcification. Stable bilateral upper lobe emphysema and scarring. Bronchitic changes in the lung bases. Possible small right effusion. No pneumothorax. No acute osseous abnormality is evident. IMPRESSION: Bronchitic changes in the lung bases, probably bronchitis/early bronchopneumonia. Possible small right effusion. Electronically Signed   By: Kristine Garbe M.D.   On: 05/11/2018 14:03    Procedures Procedures (including critical care time)  Medications Ordered in ED Medications  albuterol (PROVENTIL) (2.5 MG/3ML) 0.083% nebulizer solution 5 mg (5 mg Nebulization Given 05/11/18 1259)     Initial Impression / Assessment and Plan / ED Course  I have reviewed the triage  vital signs and the nursing notes.  Pertinent labs & imaging results that were available during my care of the patient were reviewed by me and considered in my medical decision making (see chart for details).     Patient with known history of COPD presents with worsening cough and dyspnea.  She has responded well to IV steroids and nebulizer treatments.  Chest x-ray shows bronchitis versus questionable early bronchopneumonia.  She usually takes Levaquin with exacerbations.  She will be treated as an outpatient with Levaquin and prednisone.  She understands to return if worse.  Final Clinical Impressions(s) / ED Diagnoses   Final diagnoses:  COPD exacerbation [facility])    ED Discharge Orders        Ordered    levofloxacin (LEVAQUIN) 500 MG tablet  Daily     05/11/18 1552    predniSONE (DELTASONE) 10 MG tablet  Daily with breakfast     05/11/18 1552       Nat Christen, MD 05/11/18 [phone]

## 2018-05-11 NOTE — ED Triage Notes (Signed)
SOB worse x 2 days with activity. Pt on 02 3L: [place] chronic. No resp distress at this time. Duo neb in route. One more albuterol and 125mg  solumedrol in route. Mild sob noted

## 2018-05-11 NOTE — ED Notes (Signed)
Pt O2 dropped to 86% while ambulating, pt did not report any dizziness. O2 went down to 82% after returning to room and sitting down.

## 2018-05-11 NOTE — Discharge Instructions (Addendum)
Chest x-ray shows bronchitis versus early pneumonia.  Continue your nebulizer treatments.  Use your oxygen.  Return if worse.  Prescription for Levaquin and prednisone.

## 2018-07-27 ENCOUNTER — Other Ambulatory Visit: Payer: Self-pay | Admitting: Neurology

## 2018-07-27 DIAGNOSIS — R4781 Slurred speech: Secondary | ICD-10-CM

## 2018-08-10 ENCOUNTER — Ambulatory Visit
Admission: RE | Admit: 2018-08-10 | Discharge: 2018-08-10 | Disposition: A | Discharge status: Home / Self Care | Payer: Medicare HMO | Source: Ambulatory Visit | Attending: Neurology | Admitting: Neurology

## 2018-08-10 DIAGNOSIS — R251 Tremor, unspecified: Secondary | ICD-10-CM | POA: Insufficient documentation

## 2018-08-10 DIAGNOSIS — G936 Cerebral edema: Secondary | ICD-10-CM | POA: Insufficient documentation

## 2018-08-10 DIAGNOSIS — M4312 Spondylolisthesis, cervical region: Secondary | ICD-10-CM | POA: Insufficient documentation

## 2018-08-10 DIAGNOSIS — G459 Transient cerebral ischemic attack, unspecified: Secondary | ICD-10-CM | POA: Diagnosis not present

## 2018-08-10 DIAGNOSIS — R4781 Slurred speech: Secondary | ICD-10-CM | POA: Insufficient documentation

## 2018-09-13 ENCOUNTER — Inpatient Hospital Stay
Admission: EM | Admit: 2018-09-13 | Discharge: 2018-09-16 | DRG: 194 | Disposition: A | Discharge status: Home Health | Payer: Medicare HMO | Attending: Internal Medicine | Admitting: Internal Medicine

## 2018-09-13 ENCOUNTER — Other Ambulatory Visit: Payer: Self-pay

## 2018-09-13 ENCOUNTER — Emergency Department: Payer: Medicare HMO

## 2018-09-13 DIAGNOSIS — Z825 Family history of asthma and other chronic lower respiratory diseases: Secondary | ICD-10-CM

## 2018-09-13 DIAGNOSIS — R0602 Shortness of breath: Secondary | ICD-10-CM | POA: Diagnosis present

## 2018-09-13 DIAGNOSIS — Z7982 Long term (current) use of aspirin: Secondary | ICD-10-CM

## 2018-09-13 DIAGNOSIS — E876 Hypokalemia: Secondary | ICD-10-CM | POA: Diagnosis present

## 2018-09-13 DIAGNOSIS — J189 Pneumonia, unspecified organism: Secondary | ICD-10-CM | POA: Diagnosis present

## 2018-09-13 DIAGNOSIS — I10 Essential (primary) hypertension: Secondary | ICD-10-CM | POA: Diagnosis present

## 2018-09-13 DIAGNOSIS — J9611 Chronic respiratory failure with hypoxia: Secondary | ICD-10-CM | POA: Diagnosis present

## 2018-09-13 DIAGNOSIS — J440 Chronic obstructive pulmonary disease with acute lower respiratory infection: Secondary | ICD-10-CM | POA: Diagnosis present

## 2018-09-13 DIAGNOSIS — J441 Chronic obstructive pulmonary disease with (acute) exacerbation: Secondary | ICD-10-CM | POA: Diagnosis present

## 2018-09-13 DIAGNOSIS — Z9981 Dependence on supplemental oxygen: Secondary | ICD-10-CM

## 2018-09-13 DIAGNOSIS — J181 Lobar pneumonia, unspecified organism: Principal | ICD-10-CM | POA: Diagnosis present

## 2018-09-13 DIAGNOSIS — Z8701 Personal history of pneumonia (recurrent): Secondary | ICD-10-CM | POA: Diagnosis not present

## 2018-09-13 DIAGNOSIS — Z79899 Other long term (current) drug therapy: Secondary | ICD-10-CM | POA: Diagnosis not present

## 2018-09-13 DIAGNOSIS — Z820 Family history of epilepsy and other diseases of the nervous system: Secondary | ICD-10-CM | POA: Diagnosis not present

## 2018-09-13 DIAGNOSIS — E7800 Pure hypercholesterolemia, unspecified: Secondary | ICD-10-CM | POA: Diagnosis present

## 2018-09-13 DIAGNOSIS — Z87891 Personal history of nicotine dependence: Secondary | ICD-10-CM | POA: Diagnosis not present

## 2018-09-13 LAB — BASIC METABOLIC PANEL
Anion gap: 11 (ref 5–15)
BUN: 12 mg/dL (ref 6–20)
CHLORIDE: 95 mmol/L — AB (ref 98–111)
CO2: 29 mmol/L (ref 22–32)
Calcium: 8.4 mg/dL — ABNORMAL LOW (ref 8.9–10.3)
Creatinine, Ser: 0.65 mg/dL (ref 0.44–1.00)
GFR calc non Af Amer: >60 mL/min (ref >60)
GLUCOSE: 154 mg/dL — AB (ref 70–99)
Potassium: 3.1 mmol/L — ABNORMAL LOW (ref 3.5–5.1)
Sodium: 135 mmol/L (ref 135–145)

## 2018-09-13 LAB — CBC
HEMATOCRIT: 34.1 % — AB (ref 36.0–46.0)
HEMOGLOBIN: 11.1 g/dL — AB (ref 12.0–15.0)
MCH: 28.3 pg (ref 26.0–34.0)
MCHC: 32.6 g/dL (ref 30.0–36.0)
MCV: 87 fL (ref 80.0–100.0)
Platelets: 230 10*3/uL (ref 150–400)
RBC: 3.92 MIL/uL (ref 3.87–5.11)
RDW: 12.9 % (ref 11.5–15.5)
WBC: 19.3 10*3/uL — ABNORMAL HIGH (ref 4.0–10.5)
nRBC: 0 % (ref 0.0–0.2)

## 2018-09-13 LAB — TROPONIN I: Troponin I: <0.03 ng/mL (ref <0.03)

## 2018-09-13 LAB — LACTIC ACID, PLASMA: Lactic Acid, Venous: 1.9 mmol/L (ref 0.5–1.9)

## 2018-09-13 MED ORDER — SODIUM CHLORIDE 0.9 % IV SOLN
2.0000 g | Freq: Three times a day (TID) | INTRAVENOUS | Status: DC
  Administered 2018-09-14 – 2018-09-16 (×8): 2 g via INTRAVENOUS
  Filled 2018-09-13 (×10): qty 2

## 2018-09-13 MED ORDER — FERROUS SULFATE 325 (65 FE) MG PO TABS
325.0000 mg | ORAL_TABLET | Freq: Two times a day (BID) | ORAL | Status: DC
  Administered 2018-09-14 – 2018-09-16 (×5): 325 mg via ORAL
  Filled 2018-09-13 (×5): qty 1

## 2018-09-13 MED ORDER — ACETAMINOPHEN 325 MG PO TABS: 650.0000 mg | ORAL_TABLET | Freq: Four times a day (QID) | ORAL | Status: DC | PRN

## 2018-09-13 MED ORDER — PREDNISONE 20 MG PO TABS: 50.0000 mg | ORAL_TABLET | Freq: Every day | ORAL | Status: DC

## 2018-09-13 MED ORDER — ONDANSETRON HCL 4 MG PO TABS: 4.0000 mg | ORAL_TABLET | Freq: Four times a day (QID) | ORAL | Status: DC | PRN

## 2018-09-13 MED ORDER — POLYETHYLENE GLYCOL 3350 17 G PO PACK: 17.0000 g | PACK | Freq: Every day | ORAL | Status: DC | PRN

## 2018-09-13 MED ORDER — FLUTICASONE FUROATE-VILANTEROL 100-25 MCG/INH IN AEPB
1.0000 | INHALATION_SPRAY | Freq: Every day | RESPIRATORY_TRACT | Status: DC
  Administered 2018-09-14 – 2018-09-16 (×3): 1 via RESPIRATORY_TRACT
  Filled 2018-09-13: qty 28

## 2018-09-13 MED ORDER — PANTOPRAZOLE SODIUM 40 MG PO TBEC
40.0000 mg | DELAYED_RELEASE_TABLET | Freq: Every day | ORAL | Status: DC
  Administered 2018-09-14 – 2018-09-16 (×3): 40 mg via ORAL
  Filled 2018-09-13 (×3): qty 1

## 2018-09-13 MED ORDER — PREDNISONE 20 MG PO TABS
ORAL_TABLET | ORAL | Status: AC
  Administered 2018-09-13: 30 mg
  Filled 2018-09-13: qty 2

## 2018-09-13 MED ORDER — ALBUTEROL SULFATE HFA 108 (90 BASE) MCG/ACT IN AERS: 4.0000 | INHALATION_SPRAY | Freq: Four times a day (QID) | RESPIRATORY_TRACT | Status: DC | PRN

## 2018-09-13 MED ORDER — ASPIRIN EC 81 MG PO TBEC
81.0000 mg | DELAYED_RELEASE_TABLET | Freq: Every day | ORAL | Status: DC
  Administered 2018-09-14 – 2018-09-16 (×3): 81 mg via ORAL
  Filled 2018-09-13 (×3): qty 1

## 2018-09-13 MED ORDER — NORTRIPTYLINE HCL 25 MG PO CAPS
25.0000 mg | ORAL_CAPSULE | Freq: Every day | ORAL | Status: DC
  Administered 2018-09-13 – 2018-09-15 (×3): 25 mg via ORAL
  Filled 2018-09-13 (×4): qty 1

## 2018-09-13 MED ORDER — SODIUM CHLORIDE 0.9 % IV SOLN
2.0000 g | Freq: Once | INTRAVENOUS | Status: AC
  Administered 2018-09-13: 2 g via INTRAVENOUS
  Filled 2018-09-13: qty 2

## 2018-09-13 MED ORDER — PREDNISONE 20 MG PO TABS
50.0000 mg | ORAL_TABLET | Freq: Every day | ORAL | Status: DC
  Administered 2018-09-13 – 2018-09-16 (×4): 50 mg via ORAL
  Filled 2018-09-13 (×5): qty 1

## 2018-09-13 MED ORDER — PREDNISONE 10 MG PO TABS
ORAL_TABLET | ORAL | Status: AC
  Filled 2018-09-13: qty 1

## 2018-09-13 MED ORDER — TIOTROPIUM BROMIDE MONOHYDRATE 18 MCG IN CAPS
18.0000 ug | ORAL_CAPSULE | Freq: Every day | RESPIRATORY_TRACT | Status: DC
  Administered 2018-09-14 – 2018-09-16 (×3): 18 ug via RESPIRATORY_TRACT
  Filled 2018-09-13: qty 5

## 2018-09-13 MED ORDER — GABAPENTIN 300 MG PO CAPS
600.0000 mg | ORAL_CAPSULE | Freq: Three times a day (TID) | ORAL | Status: DC
  Administered 2018-09-13 – 2018-09-15 (×5): 600 mg via ORAL
  Filled 2018-09-13 (×5): qty 2

## 2018-09-13 MED ORDER — IPRATROPIUM-ALBUTEROL 0.5-2.5 (3) MG/3ML IN SOLN
3.0000 mL | Freq: Once | RESPIRATORY_TRACT | Status: DC
  Filled 2018-09-13: qty 3

## 2018-09-13 MED ORDER — AZITHROMYCIN 250 MG PO TABS: 250.0000 mg | ORAL_TABLET | Freq: Every day | ORAL | Status: DC

## 2018-09-13 MED ORDER — SODIUM CHLORIDE 0.9 % IV SOLN
500.0000 mg | INTRAVENOUS | Status: DC
  Administered 2018-09-14 – 2018-09-15 (×2): 500 mg via INTRAVENOUS
  Filled 2018-09-13 (×4): qty 500

## 2018-09-13 MED ORDER — ACETAMINOPHEN 650 MG RE SUPP: 650.0000 mg | Freq: Four times a day (QID) | RECTAL | Status: DC | PRN

## 2018-09-13 MED ORDER — IPRATROPIUM-ALBUTEROL 0.5-2.5 (3) MG/3ML IN SOLN: 3.0000 mL | Freq: Four times a day (QID) | RESPIRATORY_TRACT | Status: DC | PRN

## 2018-09-13 MED ORDER — SIMVASTATIN 40 MG PO TABS
40.0000 mg | ORAL_TABLET | Freq: Every day | ORAL | Status: DC
  Administered 2018-09-13 – 2018-09-15 (×3): 40 mg via ORAL
  Filled 2018-09-13 (×4): qty 1

## 2018-09-13 MED ORDER — ONDANSETRON HCL 4 MG/2ML IJ SOLN: 4.0000 mg | Freq: Four times a day (QID) | INTRAMUSCULAR | Status: DC | PRN

## 2018-09-13 MED ORDER — GUAIFENESIN ER 600 MG PO TB12
600.0000 mg | ORAL_TABLET | Freq: Two times a day (BID) | ORAL | Status: DC
  Administered 2018-09-13 – 2018-09-16 (×6): 600 mg via ORAL
  Filled 2018-09-13 (×6): qty 1

## 2018-09-13 MED ORDER — ENOXAPARIN SODIUM 40 MG/0.4ML ~~LOC~~ SOLN
40.0000 mg | SUBCUTANEOUS | Status: DC
  Administered 2018-09-14 – 2018-09-15 (×2): 40 mg via SUBCUTANEOUS
  Filled 2018-09-13 (×2): qty 0.4

## 2018-09-13 MED ORDER — SODIUM CHLORIDE 0.9 % IV BOLUS
500.0000 mL | Freq: Once | INTRAVENOUS | Status: AC
  Administered 2018-09-13: 500 mL via INTRAVENOUS

## 2018-09-13 NOTE — ED Provider Notes (Signed)
[facility] Emergency Department Provider Note ____________________________________________   First MD Initiated Contact with Patient 09/13/18 1725     (approximate)  I have reviewed the triage vital signs and the nursing notes.   HISTORY  Chief Complaint Shortness of Breath    HPI [name] is a 62 y.o. female with PMH as noted below including COPD on home oxygen who presents with shortness of breath, worsened from baseline, gradually worsening over the last 3 weeks, associated with cough as well as with fever in the last few days.  The patient states that she was started on Levaquin and finished her course about 3 days ago.  She also had an x-ray 3 days ago which was negative.  She states that since this time she has been feeling worse, and has had a fever as high as 103 at home.  She denies chest pain, vomiting, leg swelling, or other acute symptoms.  Past Medical History:  Diagnosis Date  . COPD (chronic obstructive pulmonary disease) ([place])   . High cholesterol   . Hypertension     Patient Active Problem List   Diagnosis Date Noted  . Pneumonia 09/13/2018  . HCAP (healthcare-associated pneumonia) 01/29/2018  . Acute on chronic respiratory failure ([place]) 01/22/2018  . CAP (community acquired pneumonia) 01/18/2016  . Anemia, iron deficiency 11/07/2015  . Chronic respiratory failure ([place]) 11/07/2015  . Acute on chronic respiratory failure with hypoxia ([place]) 11/07/2015  . Hyperlipidemia 11/07/2015  . COPD with acute exacerbation ([place]) 11/06/2015  . Hypertension 11/06/2015    Past Surgical History:  Procedure Laterality Date  . HALLUX VALGUS BASE WEDGE Right 06/09/2015   Procedure: Base wedge osteotomy with modified McBride right foot ;  Surgeon: Sharlotte Alamo, MD;  Location: [facility] ORS;  Service: Podiatry;  Laterality: Right;    Prior to Admission medications   Medication Sig Start Date End Date Taking? Authorizing Provider  albuterol (PROVENTIL  HFA;VENTOLIN HFA) 108 (90 BASE) MCG/ACT inhaler Inhale 4-6 puffs into the lungs every 6 (six) hours as needed for wheezing or shortness of breath.   Yes [provider]  amLODipine (NORVASC) 5 MG tablet Take 5 mg by mouth at bedtime.    Yes [provider]  aspirin EC 81 MG tablet Take 81 mg by mouth daily.   Yes [provider]  ferrous sulfate 325 (65 FE) MG tablet Take 325 mg by mouth 2 (two) times daily with a meal.    Yes [provider]  fluticasone furoate-vilanterol (BREO ELLIPTA) 100-25 MCG/INH AEPB Inhale 1 puff into the lungs daily.   Yes [provider]  gabapentin (NEURONTIN) 300 MG capsule Take 600 mg by mouth 3 (three) times daily.   Yes [provider]  ipratropium (ATROVENT HFA) 17 MCG/ACT inhaler Inhale 2 puffs into the lungs every 6 (six) hours as needed for wheezing.   Yes [provider]  ipratropium-albuterol (DUONEB) 0.5-2.5 (3) MG/3ML SOLN Take 0.5 mg by nebulization every 6 (six) hours as needed. 08/03/12  Yes [provider]  nortriptyline (PAMELOR) 25 MG capsule Take 25 mg by mouth at bedtime.   Yes [provider]  OXYGEN Inhale 3 L into the lungs continuous.    Yes [provider]  pantoprazole (PROTONIX) 40 MG tablet Take 1 tablet (40 mg total) by mouth daily. 01/28/18  Yes Gladstone Lighter, MD  simvastatin (ZOCOR) 40 MG tablet Take 40 mg by mouth at bedtime.   Yes [provider]  tiotropium (SPIRIVA) 18  MCG inhalation capsule Place 18 mcg into inhaler and inhale daily.   Yes [provider]  predniSONE (DELTASONE) 10 MG tablet Take 1 tablet (10 mg total) by mouth daily with breakfast. 3 tablets for 4 days, 2 tablets for 4 days, 1 tab for 4 days Patient not taking: Reported on 09/13/2018 05/11/18   Nat Christen, MD    Allergies Patient has no known allergies.  Family History  Problem Relation Age of Onset  . Alzheimer's disease Mother   . COPD Father   .  Breast cancer Neg Hx     Social History Social History   Tobacco Use  . Smoking status: Former Smoker    Last attempt to quit: 11/04/2013    Years since quitting: 4.8  . Smokeless tobacco: Never Used  Substance Use Topics  . Alcohol use: No  . Drug use: No    Review of Systems  Constitutional: Positive for fever. Eyes: No redness. ENT: No sore throat. Cardiovascular: Positive for left upper chest pain. Respiratory: Positive for shortness of breath. Gastrointestinal: No vomiting or diarrhea.  Genitourinary: Negative for dysuria.  Musculoskeletal: Negative for back pain. Skin: Negative for rash. Neurological: Negative for headache.   ____________________________________________   PHYSICAL EXAM:  VITAL SIGNS: ED Triage Vitals  Enc Vitals Group     BP 09/13/18 1339 (!) 143/72     Pulse Rate 09/13/18 1339 (!) 114     Resp 09/13/18 1339 18     Temp 09/13/18 1339 98.9 F (37.2 C)     Temp Source 09/13/18 1339 Oral     SpO2 09/13/18 1339 92 %     Weight 09/13/18 1340 132 lb (59.9 kg)     Height 09/13/18 1340 5' (1.524 m)     Head Circumference --      Peak Flow --      Pain Score 09/13/18 1339 6     Pain Loc --      Pain Edu? --      Excl. in [place]? --     Constitutional: Alert and oriented.  Somewhat weak appearing but in no acute distress. Eyes: Conjunctivae are normal.  Head: Atraumatic. Nose: No congestion/rhinnorhea. Mouth/Throat: Mucous membranes are slightly dry.   Neck: Normal range of motion.  Cardiovascular: Normal rate, regular rhythm. Grossly normal heart sounds.  Good peripheral circulation. Respiratory: Normal respiratory effort.  No retractions.  Somewhat decreased breath sounds bilaterally. Gastrointestinal: No distention.  Musculoskeletal: No lower extremity edema.  Extremities warm and well perfused.  Neurologic:  Normal speech and language. No gross focal neurologic deficits are appreciated.  Skin:  Skin is warm and dry. No rash  noted. Psychiatric: Mood and affect are normal. Speech and behavior are normal.  ____________________________________________   LABS (all labs ordered are listed, but only abnormal results are displayed)  Labs Reviewed  BASIC METABOLIC PANEL - Abnormal; Notable for the following components:      Result Value   Potassium 3.1 (*)    Chloride 95 (*)    Glucose, Bld 154 (*)    Calcium 8.4 (*)    All other components within normal limits  CBC - Abnormal; Notable for the following components:   WBC 19.3 (*)    Hemoglobin 11.1 (*)    HCT 34.1 (*)    All other components within normal limits  TROPONIN I  LACTIC ACID, PLASMA  LACTIC ACID, PLASMA  CBC   ____________________________________________  EKG  ED ECG REPORT I, Arta Silence, the attending physician,  personally viewed and interpreted this ECG.  Date: 09/13/2018 EKG Time: 1342 Rate: 111 Rhythm: Sinus tachycardia QRS Axis: normal Intervals: normal ST/T Wave abnormalities: normal Narrative Interpretation: no evidence of acute ischemia  ____________________________________________  RADIOLOGY  CXR: New left apical opacity  ____________________________________________   PROCEDURES  Procedure(s) performed: No  Procedures  Critical Care performed: No ____________________________________________   INITIAL IMPRESSION / ASSESSMENT AND PLAN / ED COURSE  Pertinent labs & imaging results that were available during my care of the patient were reviewed by me and considered in my medical decision making (see chart for details).  62 year old female with history of COPD on home oxygen presents with worsening shortness of breath, cough, and fever over the last 3 weeks, worsened in the last 2 to 3 days.  The patient had just finished an outpatient course of Levaquin.  I reviewed the past medical records in Epic, and the patient had an x-ray on 2519 that was negative for acute infiltrate.  She was admitted in March  with respiratory failure, and subsequently with HCAP.  On exam, the patient has normal vital signs and O2 saturation in the low 90s on nasal cannula.  She is in no acute respiratory distress.  The remainder of the exam is as described above.  X-ray obtained today shows a new infiltrate in the left upper lobe which was not seen on the x-ray from 2 days ago.  The patient also has a WBC count of 19.  These findings are consistent with acute pneumonia.  Given that the patient has essentially failed outpatient antibiotic therapy, I will give IV antibiotics and admit.  ----------------------------------------- 9:01 PM on 09/13/2018 -----------------------------------------  Patient was signed out to the hospitalist Dr. Posey Pronto.  ____________________________________________   FINAL CLINICAL IMPRESSION(S) / ED DIAGNOSES  Final diagnoses:  Community acquired pneumonia of left upper lobe of lung ([place])      NEW MEDICATIONS STARTED DURING THIS VISIT:  Current Discharge Medication List       Note:  This document was prepared using Dragon voice recognition software and may include unintentional dictation errors.    Arta Silence, MD 09/13/18 2101

## 2018-09-13 NOTE — ED Triage Notes (Signed)
Pt c/o SOB x few weeks. States her PCP put her on 2 weeks of antibiotics but not feeling any better. On 3 L [place] chronically. Hx COPD. States pain in L chest when she coughs

## 2018-09-13 NOTE — ED Notes (Signed)
Report to Crystal, RN.

## 2018-09-13 NOTE — Consult Note (Signed)
Pharmacy Antibiotic Note  [name] is a 62 y.o. female admitted on 09/13/2018 with pneumonia.  Pharmacy has been consulted for cefepime dosing.  Plan: Cefepime 2 gm IV every 8 hours  Height: 5' (152.4 cm) Weight: 132 lb (59.9 kg) IBW/kg (Calculated) : 45.5  Temp (24hrs), Avg:98.9 F (37.2 C), Min:98.9 F (37.2 C), Max:98.9 F (37.2 C)  Recent Labs  Lab 09/13/18 1341  WBC 19.3*  CREATININE 0.65    Estimated Creatinine Clearance: 60.6 mL/min (by C-G formula based on SCr of 0.65 mg/dL).    No Known Allergies  Antimicrobials this admission: Cefepime 10/27 >>  Dose adjustments this admission:  Microbiology results:  BCx:   UCx:    Sputum:    MRSA PCR:   Thank you for allowing pharmacy to be a part of this patient's care.  Forrest Moron, PharmD 09/13/2018 6:23 PM

## 2018-09-13 NOTE — ED Notes (Signed)
Call to floor to notify that they are to come get patient and that she can ride in a wheelchair

## 2018-09-13 NOTE — H&P (Addendum)
[place] [facility] [place] NAME: [name]    MR#:  [id number]  DATE OF BIRTH:  22-Jan-1956  DATE OF ADMISSION:  09/13/2018  PRIMARY CARE PHYSICIAN: [facility] [place]   REQUESTING/REFERRING PHYSICIAN: Dr Cherylann Banas  CHIEF COMPLAINT:  increasing cough fever of 102 and shortness of breath  HISTORY OF PRESENT ILLNESS:  [name]  is a 62 y.o. female with a known history of chronic respiratory failure on 3 L nasal cannula oxygen due to COPD with history of heavy tobacco abuse in the past, hypertension comes to the emergency room with increasing cough nonproductive with fever of 103 and shortness of breath. Patient recently finished the course of Levaquin 10 days. She did not get any prednisone. Continues with cough nonproductive and fever at home. Found to have new left upper lobe infiltrate. White count is 19,000. Patient is tachycardic with heart rate in the 100's. She is being admitted with a community acquired pneumonia.  PAST MEDICAL HISTORY:   Past Medical History:  Diagnosis Date  . COPD (chronic obstructive pulmonary disease) ([place])   . High cholesterol   . Hypertension     PAST SURGICAL HISTOIRY:   Past Surgical History:  Procedure Laterality Date  . HALLUX VALGUS BASE WEDGE Right 06/09/2015   Procedure: Base wedge osteotomy with modified McBride right foot ;  Surgeon: Sharlotte Alamo, MD;  Location: [facility] ORS;  Service: Podiatry;  Laterality: Right;    SOCIAL HISTORY:   Social History   Tobacco Use  . Smoking status: Former Smoker    Last attempt to quit: 11/04/2013    Years since quitting: 4.8  . Smokeless tobacco: Never Used  Substance Use Topics  . Alcohol use: No    FAMILY HISTORY:   Family History  Problem Relation Age of Onset  . Alzheimer's disease Mother   . COPD Father   . Breast cancer Neg Hx     DRUG ALLERGIES:  No Known Allergies  REVIEW OF SYSTEMS:  Review of Systems  Constitutional:  Positive for fever. Negative for chills and weight loss.  HENT: Negative for ear discharge, ear pain and nosebleeds.   Eyes: Negative for blurred vision, pain and discharge.  Respiratory: Positive for cough and shortness of breath. Negative for sputum production, wheezing and stridor.   Cardiovascular: Negative for chest pain, palpitations, orthopnea and PND.  Gastrointestinal: Negative for abdominal pain, diarrhea, nausea and vomiting.  Genitourinary: Negative for frequency and urgency.  Musculoskeletal: Negative for back pain and joint pain.  Neurological: Negative for sensory change, speech change, focal weakness and weakness.  Psychiatric/Behavioral: Negative for depression and hallucinations. The patient is not nervous/anxious.      MEDICATIONS AT HOME:   Prior to Admission medications   Medication Sig Start Date End Date Taking? Authorizing Provider  albuterol (PROVENTIL HFA;VENTOLIN HFA) 108 (90 BASE) MCG/ACT inhaler Inhale 4-6 puffs into the lungs every 6 (six) hours as needed for wheezing or shortness of breath.    [provider]  aspirin EC 81 MG tablet Take 81 mg by mouth daily.    [provider]  ferrous sulfate 325 (65 FE) MG tablet Take 325 mg by mouth 2 (two) times daily with a meal.     [provider]  fluticasone furoate-vilanterol (BREO ELLIPTA) 100-25 MCG/INH AEPB Inhale 1 puff into the lungs daily.    [provider]  gabapentin (NEURONTIN) 300 MG capsule Take 600 mg by mouth 3 (three) times daily.  [provider]  ipratropium (ATROVENT HFA) 17 MCG/ACT inhaler Inhale 2 puffs into the lungs every 6 (six) hours as needed for wheezing.    [provider]  ipratropium-albuterol (DUONEB) 0.5-2.5 (3) MG/3ML SOLN Take 0.5 mg by nebulization every 6 (six) hours as needed. 08/03/12   [provider]  nortriptyline (PAMELOR) 25 MG capsule Take 25 mg by mouth at bedtime.    [provider]  OXYGEN Inhale 3 L  into the lungs continuous.     [provider]  pantoprazole (PROTONIX) 40 MG tablet Take 1 tablet (40 mg total) by mouth daily. 01/28/18   Gladstone Lighter, MD  predniSONE (DELTASONE) 10 MG tablet Take 1 tablet (10 mg total) by mouth daily with breakfast. 3 tablets for 4 days, 2 tablets for 4 days, 1 tab for 4 days 05/11/18   Nat Christen, MD  simvastatin (ZOCOR) 40 MG tablet Take 40 mg by mouth at bedtime.    [provider]  tiotropium (SPIRIVA) 18 MCG inhalation capsule Place 18 mcg into inhaler and inhale daily.    [provider]      VITAL SIGNS:  Blood pressure 125/67, pulse (!) 106, temperature 98.9 F (37.2 C), temperature source Oral, resp. rate (!) 26, height 5' (1.524 m), weight 59.9 kg, SpO2 94 %.  PHYSICAL EXAMINATION:  GENERAL:  62 y.o.-year-old patient lying in the bed with no acute distress. Appears chronically ill, thin EYES: Pupils equal, round, reactive to light and accommodation. No scleral icterus. Extraocular muscles intact.  HEENT: Head atraumatic, normocephalic. Oropharynx and nasopharynx clear.  NECK:  Supple, no jugular venous distention. No thyroid enlargement, no tenderness.  LUNGS: coarse breath sounds bilaterally, no wheezing, rales,rhonchi or crepitation. No use of accessory muscles of respiration.  CARDIOVASCULAR: S1, S2 normal. No murmurs, rubs, or gallops. Tachycardia ABDOMEN: Soft, nontender, nondistended. Bowel sounds present. No organomegaly or mass.  EXTREMITIES: No pedal edema, cyanosis, or clubbing.  NEUROLOGIC: Cranial nerves II through XII are intact. Muscle strength 5/5 in all extremities. Sensation intact. Gait not checked.  PSYCHIATRIC: The patient is alert and oriented x 3.  SKIN: No obvious rash, lesion, or ulcer.   LABORATORY PANEL:   CBC Recent Labs  Lab 09/13/18 1341  WBC 19.3*  HGB 11.1*  HCT 34.1*  PLT 230    ------------------------------------------------------------------------------------------------------------------  Chemistries  Recent Labs  Lab 09/13/18 1341  NA 135  K 3.1*  CL 95*  CO2 29  GLUCOSE 154*  BUN 12  CREATININE 0.65  CALCIUM 8.4*   ------------------------------------------------------------------------------------------------------------------  Cardiac Enzymes Recent Labs  Lab 09/13/18 1341  TROPONINI <0.03   ------------------------------------------------------------------------------------------------------------------  RADIOLOGY:  Dg Chest 2 View  Result Date: 09/13/2018 CLINICAL DATA:  Pt c/o SOB x few weeks. States her PCP put her on 2 weeks of antibiotics but not feeling any better. On 3 L [place] chronically. Hx COPD. States pain in L chest when she coughs. Hx HTN, COPD. Former smoker. EXAM: CHEST - 2 VIEW COMPARISON:  None. FINDINGS: Cardiac silhouette is normal in size. No mediastinal or hilar masses or evidence of adenopathy. There is opacity at the left apex, new since the prior exams. There are prominent bronchovascular markings and areas of lung scarring that are stable. No pleural effusion or pneumothorax. Skeletal structures are intact. IMPRESSION: 1. There is new opacity at the left apex compared to prior exams. This may reflect pneumonia. Consider further assessment with chest CT on a nonemergent basis. 2. No other evidence of acute cardiopulmonary disease. 3. Chronic  lung findings Electronically Signed   By: Lajean Manes M.D.   On: 09/13/2018 15:18    EKG:   Sinus tachycardia IMPRESSION AND PLAN:   Varnika Butz  is a 62 y.o. female with a known history of chronic respiratory failure on 3 L nasal cannula oxygen due to COPD with history of heavy tobacco abuse in the past, hypertension comes to the emergency room with increasing cough nonproductive with fever of 103 and shortness of breath.  1. community acquired pneumonia-- failed outpatient  treatment -admit to medical floor -IV cefepime and Zithromax -patient unable to give sputum sample -follow fever curve and white count  2. COPD exacerbation mild acute on chronic -prednisone oral taper, inhalers, nebulizer  3. Hypertension continue home meds  4. DVT prophylaxis subcu Lovenox  5. Leucocytosis due to pneumonia Follow cbc  All the records are reviewed and case discussed with ED provider. Management plans discussed with the patient, family and they are in agreement.  CODE STATUS: full  TOTAL TIME TAKING CARE OF THIS PATIENT: **50 minutes.    Fritzi Mandes M.D on 09/13/2018 at 6:19 PM  Between 7am to 6pm - Pager - [phone]  After 6pm go to [email or web address] - password [email or web address] [facility]  [facility]  Office  [phone]  CC: Primary care physician; [facility]

## 2018-09-13 NOTE — Progress Notes (Signed)
Family Meeting Note  Advance Directive: no daughter present in the emergency room. Discuss code status with patient she wishes to be full code. Patient is being admitted with pneumonia left upper lobe. She has acute on chronic COPD mild exacerbation uses 3 L nasal cannula oxygen at home.   Time spent during discussion: 16 minutes. Fritzi Mandes, MD

## 2018-09-14 ENCOUNTER — Inpatient Hospital Stay: Payer: Medicare HMO

## 2018-09-14 LAB — CBC
HEMATOCRIT: 36.6 % (ref 36.0–46.0)
Hemoglobin: 11.7 g/dL — ABNORMAL LOW (ref 12.0–15.0)
MCH: 28.1 pg (ref 26.0–34.0)
MCHC: 32 g/dL (ref 30.0–36.0)
MCV: 87.8 fL (ref 80.0–100.0)
PLATELETS: 220 10*3/uL (ref 150–400)
RBC: 4.17 MIL/uL (ref 3.87–5.11)
RDW: 12.7 % (ref 11.5–15.5)
WBC: 12 10*3/uL — ABNORMAL HIGH (ref 4.0–10.5)
nRBC: 0 % (ref 0.0–0.2)

## 2018-09-14 MED ORDER — IOHEXOL 350 MG/ML SOLN
75.0000 mL | Freq: Once | INTRAVENOUS | Status: AC | PRN
  Administered 2018-09-14: 75 mL via INTRAVENOUS

## 2018-09-14 NOTE — Progress Notes (Signed)
[place] at [place] NAME: [name]    MR#:  [id number]  DATE OF BIRTH:  30-Apr-1956  SUBJECTIVE:  CHIEF COMPLAINT:   Chief Complaint  Patient presents with  . Shortness of Breath  not feeling much better, very dyspneic and increasing O2 requirement REVIEW OF SYSTEMS:  Review of Systems  Constitutional: Negative for chills, fever and weight loss.  HENT: Negative for nosebleeds and sore throat.   Eyes: Negative for blurred vision.  Respiratory: Positive for cough and shortness of breath. Negative for wheezing.   Cardiovascular: Negative for chest pain, orthopnea, leg swelling and PND.  Gastrointestinal: Negative for abdominal pain, constipation, diarrhea, heartburn, nausea and vomiting.  Genitourinary: Negative for dysuria and urgency.  Musculoskeletal: Negative for back pain.  Skin: Negative for rash.  Neurological: Negative for dizziness, speech change, focal weakness and headaches.  Endo/Heme/Allergies: Does not bruise/bleed easily.  Psychiatric/Behavioral: Negative for depression.    DRUG ALLERGIES:  No Known Allergies VITALS:  Blood pressure 124/71, pulse 95, temperature (!) 97.4 F (36.3 C), temperature source Oral, resp. rate 20, height 5' (1.524 m), weight 56 kg, SpO2 91 %. PHYSICAL EXAMINATION:  Physical Exam  Constitutional: She is oriented to person, place, and time.  HENT:  Head: Normocephalic and atraumatic.  Eyes: Pupils are equal, round, and reactive to light. Conjunctivae and EOM are normal.  Neck: Normal range of motion. Neck supple. No tracheal deviation present. No thyromegaly present.  Cardiovascular: Normal rate, regular rhythm and normal heart sounds.  Pulmonary/Chest: Accessory muscle usage present. Tachypnea noted. She is in respiratory distress. She has decreased breath sounds. She has rhonchi in the left upper field. She exhibits no tenderness.  Abdominal: Soft. Bowel sounds are normal. She exhibits no  distension. There is no tenderness.  Musculoskeletal: Normal range of motion.  Neurological: She is alert and oriented to person, place, and time. No cranial nerve deficit.  Skin: Skin is warm and dry. No rash noted.   LABORATORY PANEL:  Female CBC Recent Labs  Lab 09/14/18 0537  WBC 12.0*  HGB 11.7*  HCT 36.6  PLT 220   ------------------------------------------------------------------------------------------------------------------ Chemistries  Recent Labs  Lab 09/13/18 1341  NA 135  K 3.1*  CL 95*  CO2 29  GLUCOSE 154*  BUN 12  CREATININE 0.65  CALCIUM 8.4*   RADIOLOGY:  Ct Angio Chest Pe W Or Wo Contrast  Result Date: 09/14/2018 CLINICAL DATA:  62 year old female with a history of left upper lobe pneumonia EXAM: CT ANGIOGRAPHY CHEST WITH CONTRAST TECHNIQUE: Multidetector CT imaging of the chest was performed using the standard protocol during bolus administration of intravenous contrast. Multiplanar CT image reconstructions and MIPs were obtained to evaluate the vascular anatomy. CONTRAST:  77mL OMNIPAQUE IOHEXOL 350 MG/ML SOLN COMPARISON:  11/28/2015, 06/24/2015, multiple recent chest x-ray FINDINGS: Cardiovascular: Heart: No cardiomegaly. No pericardial fluid/thickening. No significant coronary calcifications. Aorta: Unremarkable course, caliber, contour of the thoracic aorta. No aneurysm or dissection flap. No periaortic fluid. Calcifications of the aortic arch and the branch vessels. Cervical cerebral arteries are patent at the lower neck. Bilateral subclavian artery patent. Pulmonary arteries: No central, lobar, segmental, or proximal subsegmental filling defects. Mediastinum/Nodes: Multiple borderline enlarged mediastinal lymph nodes, including prevascular, AP window, lowest paratracheal, subcarinal, and left hilar nodes. Unremarkable appearance of the thoracic esophagus. Hiatal hernia. Unremarkable thoracic inlet. Lungs/Pleura: Centrilobular and paraseptal emphysema.  Confluent opacity at the left lung a past X measures 2.8 cm and extends to the pleura of the  left upper lobe/chest. The in calming a contrast bolus somewhat limits evaluation with significant streak artifact. There is associated peribronchial vasculature nodularity of the left upper lobe. There are a few nodular changes within the left upper lobe including image 26, 28, and a more central nodule on image 35 measuring 19 mm. Atelectasis of the bilateral lung bases. Mild peribronchial thickening of the lower lobes. No large pleural effusion or pneumothorax. Architectural distortion at the right lung apex on image 23. Upper Abdomen: No acute. Musculoskeletal: No acute displaced fracture. Degenerative changes of the spine. Review of the MIP images confirms the above findings. IMPRESSION: Multifocal pneumonia, most severe in the left upper lobe. Reactive mediastinal adenopathy. Short-term follow-up CT with contrast is recommended to assure complete resolution. Note that it is recommended the follow-up CT be performed with the contrast bolus from the right arm. CT is negative for pulmonary emboli. Advanced paraseptal and centrilobular emphysema with associated architectural distortion/scarring. Emphysema (ICD10-J43.9). Aortic atherosclerosis.  Aortic Atherosclerosis (ICD10-I70.0). Hiatal hernia Electronically Signed   By: Corrie Mckusick D.O.   On: 09/14/2018 13:05   ASSESSMENT AND PLAN:  [name]  is a 62 y.o. female with a known history of chronic respiratory failure on 3 L nasal cannula oxygen due to COPD with history of heavy tobacco abuse in the past, hypertension comes to the emergency room with increasing cough nonproductive with fever of 103 and shortness of breath.  1. community acquired pneumonia-- failed outpatient treatment - continue IV cefepime and Zithromax -patient unable to give sputum sample -follow fever curve and white count - will obtain CT chest as O2 requirements are going up and seems  very dyspneic to R/O PE and other patho - CT shows Multifocal pneumonia, most severe in the left upper lobe. Reactive mediastinal adenopathy. No PE  2. COPD exacerbation mild acute on chronic -prednisone oral taper, inhalers, nebulizer  3. Hypertension continue home meds  4. DVT prophylaxis subcu Lovenox  5. Leucocytosis due to pneumonia Follow cbc   Looks critically sick - will obtain STAT CT chest, at risk for acute cardio-pulmo failure   All the records are reviewed and case discussed with Care Management/Social Worker. Management plans discussed with the patient, nursing and they are in agreement.  CODE STATUS: Full Code  TOTAL TIME (Critical care) TAKING CARE OF THIS PATIENT: 35 minutes.   More than 50% of the time was spent in counseling/coordination of care: YES  POSSIBLE D/C IN 1-2 DAYS, DEPENDING ON CLINICAL CONDITION.   Max Sane M.D on 09/14/2018 at 9:43 PM  Between 7am to 6pm - Pager - [phone]  After 6pm go to [email or web address] - Technical brewer [facility]  Office  [phone]  CC: Primary care physician; Center, [facility]  Note: This dictation was prepared with Dragon dictation along with smaller phrase technology. Any transcriptional errors that result from this process are unintentional.

## 2018-09-14 NOTE — Progress Notes (Signed)
Responded to IV consult. RN present and was able to obtain appropriate access for CT scan.

## 2018-09-14 NOTE — Plan of Care (Signed)
Oxygen increased to 4L this morning as the patient's oxygen level was 85% on 3l. With 4L of oxygen the oxygen level improved to 92%. CT of the chest performed. Pain has been tolerable at this time. Problem: Education: Goal: Knowledge of General Education information will improve Description Including pain rating scale, medication(s)/side effects and non-pharmacologic comfort measures Outcome: Progressing   Problem: Health Behavior/Discharge Planning: Goal: Ability to manage health-related needs will improve Outcome: Progressing   Problem: Clinical Measurements: Goal: Ability to maintain clinical measurements within normal limits will improve Outcome: Progressing Goal: Will remain free from infection Outcome: Progressing Goal: Diagnostic test results will improve Outcome: Progressing Goal: Respiratory complications will improve Outcome: Progressing Goal: Cardiovascular complication will be avoided Outcome: Progressing   Problem: Activity: Goal: Risk for activity intolerance will decrease Outcome: Progressing   Problem: Nutrition: Goal: Adequate nutrition will be maintained Outcome: Progressing   Problem: Coping: Goal: Level of anxiety will decrease Outcome: Progressing   Problem: Elimination: Goal: Will not experience complications related to bowel motility Outcome: Progressing Goal: Will not experience complications related to urinary retention Outcome: Progressing   Problem: Pain Managment: Goal: General experience of comfort will improve Outcome: Progressing   Problem: Safety: Goal: Ability to remain free from injury will improve Outcome: Progressing   Problem: Skin Integrity: Goal: Risk for impaired skin integrity will decrease Outcome: Progressing

## 2018-09-15 LAB — BASIC METABOLIC PANEL
Anion gap: 7 (ref 5–15)
BUN: 16 mg/dL (ref 6–20)
CALCIUM: 8.5 mg/dL — AB (ref 8.9–10.3)
CO2: 33 mmol/L — ABNORMAL HIGH (ref 22–32)
Chloride: 101 mmol/L (ref 98–111)
Creatinine, Ser: 0.58 mg/dL (ref 0.44–1.00)
GFR calc non Af Amer: >60 mL/min (ref >60)
Glucose, Bld: 130 mg/dL — ABNORMAL HIGH (ref 70–99)
Potassium: 3.4 mmol/L — ABNORMAL LOW (ref 3.5–5.1)
SODIUM: 141 mmol/L (ref 135–145)

## 2018-09-15 LAB — CBC
HCT: 32.6 % — ABNORMAL LOW (ref 36.0–46.0)
Hemoglobin: 10.5 g/dL — ABNORMAL LOW (ref 12.0–15.0)
MCH: 28 pg (ref 26.0–34.0)
MCHC: 32.2 g/dL (ref 30.0–36.0)
MCV: 86.9 fL (ref 80.0–100.0)
Platelets: 269 10*3/uL (ref 150–400)
RBC: 3.75 MIL/uL — ABNORMAL LOW (ref 3.87–5.11)
RDW: 12.6 % (ref 11.5–15.5)
WBC: 12.3 10*3/uL — AB (ref 4.0–10.5)
nRBC: 0 % (ref 0.0–0.2)

## 2018-09-15 MED ORDER — GABAPENTIN 400 MG PO CAPS
400.0000 mg | ORAL_CAPSULE | Freq: Three times a day (TID) | ORAL | Status: DC
  Administered 2018-09-15 – 2018-09-16 (×3): 400 mg via ORAL
  Filled 2018-09-15 (×3): qty 1

## 2018-09-15 MED ORDER — SODIUM CHLORIDE 0.9 % IV SOLN
INTRAVENOUS | Status: DC | PRN
  Administered 2018-09-15 – 2018-09-16 (×3): 250 mL via INTRAVENOUS

## 2018-09-15 MED ORDER — ALUM & MAG HYDROXIDE-SIMETH 200-200-20 MG/5ML PO SUSP
30.0000 mL | Freq: Once | ORAL | Status: AC
  Administered 2018-09-15: 30 mL via ORAL
  Filled 2018-09-15: qty 30

## 2018-09-15 MED ORDER — POTASSIUM CHLORIDE CRYS ER 20 MEQ PO TBCR
40.0000 meq | EXTENDED_RELEASE_TABLET | Freq: Once | ORAL | Status: AC
  Administered 2018-09-15: 40 meq via ORAL
  Filled 2018-09-15: qty 2

## 2018-09-15 NOTE — Progress Notes (Signed)
[facility] at [place] NAME: [name]    MR#:  [id number]  DATE OF BIRTH:  1956/03/09  SUBJECTIVE:  CHIEF COMPLAINT:   Chief Complaint  Patient presents with  . Shortness of Breath  Still short of breath but improving. REVIEW OF SYSTEMS:  Review of Systems  Constitutional: Negative for chills, fever and weight loss.  HENT: Negative for nosebleeds and sore throat.   Eyes: Negative for blurred vision.  Respiratory: Positive for cough and shortness of breath. Negative for wheezing.   Cardiovascular: Negative for chest pain, orthopnea, leg swelling and PND.  Gastrointestinal: Negative for abdominal pain, constipation, diarrhea, heartburn, nausea and vomiting.  Genitourinary: Negative for dysuria and urgency.  Musculoskeletal: Negative for back pain.  Skin: Negative for rash.  Neurological: Negative for dizziness, speech change, focal weakness and headaches.  Endo/Heme/Allergies: Does not bruise/bleed easily.  Psychiatric/Behavioral: Negative for depression.   DRUG ALLERGIES:  No Known Allergies VITALS:  Blood pressure 125/70, pulse 92, temperature (!) 97.4 F (36.3 C), temperature source Oral, resp. rate 19, height 5' (1.524 m), weight 56 kg, SpO2 91 %. PHYSICAL EXAMINATION:  Physical Exam  Constitutional: She is oriented to person, place, and time.  HENT:  Head: Normocephalic and atraumatic.  Eyes: Pupils are equal, round, and reactive to light. Conjunctivae and EOM are normal.  Neck: Normal range of motion. Neck supple. No tracheal deviation present. No thyromegaly present.  Cardiovascular: Normal rate, regular rhythm and normal heart sounds.  Pulmonary/Chest: No tachypnea. She has decreased breath sounds. She has rhonchi in the left upper field. She exhibits no tenderness.  Abdominal: Soft. Bowel sounds are normal. She exhibits no distension. There is no tenderness.  Musculoskeletal: Normal range of motion.  Neurological: She is  alert and oriented to person, place, and time. No cranial nerve deficit.  Skin: Skin is warm and dry. No rash noted.   LABORATORY PANEL:  Female CBC Recent Labs  Lab 09/15/18 0355  WBC 12.3*  HGB 10.5*  HCT 32.6*  PLT 269   ------------------------------------------------------------------------------------------------------------------ Chemistries  Recent Labs  Lab 09/15/18 0355  NA 141  K 3.4*  CL 101  CO2 33*  GLUCOSE 130*  BUN 16  CREATININE 0.58  CALCIUM 8.5*   RADIOLOGY:  No results found. ASSESSMENT AND PLAN:  [name]  is a 62 y.o. female with a known history of chronic respiratory failure on 3 L nasal cannula oxygen due to COPD with history of heavy tobacco abuse in the past, hypertension comes to the emergency room with increasing cough nonproductive with fever of 103 and shortness of breath.  1. community acquired pneumonia-- failed outpatient treatment - continue IV cefepime and Zithromax -patient unable to give sputum sample -follow fever curve and white count - CT chest shows Multifocal pneumonia, most severe in the left upper lobe. Reactive mediastinal adenopathy. No PE  2. COPD exacerbation mild acute on chronic -prednisone oral taper, inhalers, nebulizer  3. Hypertension continue home meds  4.   Hypokalemia -Replete and recheck  5. Leucocytosis due to pneumonia Follow cbc     All the records are reviewed and case discussed with Care Management/Social Worker. Management plans discussed with the patient, nursing and they are in agreement.  CODE STATUS: Full Code  TOTAL TIME TAKING CARE OF THIS PATIENT: 35 minutes.   More than 50% of the time was spent in counseling/coordination of care: YES  POSSIBLE D/C IN 1 DAYS, DEPENDING ON CLINICAL CONDITION.   [name]  Manuella Ghazi M.D on 09/15/2018 at 3:33 PM  Between 7am to 6pm - Pager - [phone]  After 6pm go to [email or web address] - Technical brewer [facility]    Office  [phone]  CC: Primary care physician; Center, [facility]  Note: This dictation was prepared with Dragon dictation along with smaller phrase technology. Any transcriptional errors that result from this process are unintentional.

## 2018-09-16 LAB — CBC
HEMATOCRIT: 29.6 % — AB (ref 36.0–46.0)
HEMOGLOBIN: 9.4 g/dL — AB (ref 12.0–15.0)
MCH: 28.1 pg (ref 26.0–34.0)
MCHC: 31.8 g/dL (ref 30.0–36.0)
MCV: 88.4 fL (ref 80.0–100.0)
Platelets: 272 10*3/uL (ref 150–400)
RBC: 3.35 MIL/uL — AB (ref 3.87–5.11)
RDW: 12.5 % (ref 11.5–15.5)
WBC: 7.9 10*3/uL (ref 4.0–10.5)
nRBC: 0 % (ref 0.0–0.2)

## 2018-09-16 LAB — BASIC METABOLIC PANEL
Anion gap: 6 (ref 5–15)
BUN: 11 mg/dL (ref 6–20)
CHLORIDE: 102 mmol/L (ref 98–111)
CO2: 32 mmol/L (ref 22–32)
CREATININE: 0.41 mg/dL — AB (ref 0.44–1.00)
Calcium: 8.1 mg/dL — ABNORMAL LOW (ref 8.9–10.3)
GFR calc Af Amer: >60 mL/min (ref >60)
GFR calc non Af Amer: >60 mL/min (ref >60)
Glucose, Bld: 110 mg/dL — ABNORMAL HIGH (ref 70–99)
POTASSIUM: 3.7 mmol/L (ref 3.5–5.1)
SODIUM: 140 mmol/L (ref 135–145)

## 2018-09-16 MED ORDER — AMOXICILLIN-POT CLAVULANATE 875-125 MG PO TABS: 1.0000 | ORAL_TABLET | Freq: Two times a day (BID) | ORAL | 0 refills | Status: AC

## 2018-09-16 MED ORDER — PREDNISONE 10 MG (21) PO TBPK: ORAL_TABLET | ORAL | 0 refills | Status: DC

## 2018-09-16 NOTE — Discharge Instructions (Signed)

## 2018-09-16 NOTE — Care Management Note (Signed)
Case Management Note  Patient Details  Name: RENIE STELMACH MRN: [id number] Date of Birth: 10/31/1956   Patient admitted with COPD.  Patient to discharge today.  Patient lives at home alone.  Daughter lives locally for support and transportation if needed.  Patient has chronic O2.  Patient states that her portable O2 is in the room for discharge.  Patient has be ordered home health.  Patient agreeable to home health services.  Patient states that she has had [facility] previously and would like to use them again.  Referral made to [facility] with [place].   COPD protocol was signed by MD and provided to [facility] with [place].  Copy sent to medical records.   Subjective/Objective:                    Action/Plan:   Expected Discharge Date:  09/16/18               Expected Discharge Plan:  [place]  In-House Referral:     Discharge planning Services  CM Consult  Post Acute Care Choice:  Home Health Choice offered to:  Patient  DME Arranged:    DME Agency:     HH Arranged:  RN, PT, Nurse's Aide, OT, Respirator Therapy HH Agency:  [facility]  Status of Service:  Completed, signed off  If discussed at Agency of Stay Meetings, dates discussed:    Additional Comments:  Beverly Sessions, RN 09/16/2018, 10:38 AM

## 2018-09-16 NOTE — Care Management Important Message (Signed)
Copy of signed IM left with patient in room.  

## 2018-09-21 NOTE — Discharge Summary (Signed)
[place] at [place] NAME: [name]    MR#:  [id number]  DATE OF BIRTH:  09-Jun-1956  DATE OF ADMISSION:  09/13/2018   ADMITTING PHYSICIAN: Fritzi Mandes, MD  DATE OF DISCHARGE: 09/16/2018  1:57 PM  PRIMARY CARE PHYSICIAN: [facility], [facility]   ADMISSION DIAGNOSIS:  Community acquired pneumonia of left upper lobe of lung ([place]) [J18.1] DISCHARGE DIAGNOSIS:  Active Problems:   Pneumonia  SECONDARY DIAGNOSIS:   Past Medical History:  Diagnosis Date  . COPD (chronic obstructive pulmonary disease) ([place])   . High cholesterol   . Hypertension    HOSPITAL COURSE:  [name] a62 y.o.femalewith a known history of chronic respiratory failure on 3 L nasal cannula oxygen due to COPD with history of heavy tobacco abuse in the past, hypertension comes to the emergency room with increasing cough nonproductive with fever of 103 and shortness of breath.  1.community acquired pneumonia--Improved with Abx.  2.COPD exacerbation mild acute on chronic - improved with steroids and nebs  3.Hypertension: stable on home meds  4.  Hypokalemia -Repleted  5. Leucocytosis due to pneumonia Improved with treatment DISCHARGE CONDITIONS:  stable CONSULTS OBTAINED:   DRUG ALLERGIES:  No Known Allergies DISCHARGE MEDICATIONS:   Allergies as of 09/16/2018   No Known Allergies     Medication List    STOP taking these medications   predniSONE 10 MG tablet Commonly known as:  DELTASONE Replaced by:  predniSONE 10 MG (21) Tbpk tablet     TAKE these medications   albuterol 108 (90 Base) MCG/ACT inhaler Commonly known as:  PROVENTIL HFA;VENTOLIN HFA Inhale 4-6 puffs into the lungs every 6 (six) hours as needed for wheezing or shortness of breath.   amLODipine 5 MG tablet Commonly known as:  NORVASC Take 5 mg by mouth at bedtime.   amoxicillin-clavulanate 875-125 MG tablet Commonly known as:  AUGMENTIN Take 1  tablet by mouth every 12 (twelve) hours for 5 days.   aspirin EC 81 MG tablet Take 81 mg by mouth daily.   BREO ELLIPTA 100-25 MCG/INH Aepb Generic drug:  fluticasone furoate-vilanterol Inhale 1 puff into the lungs daily.   ferrous sulfate 325 (65 FE) MG tablet Take 325 mg by mouth 2 (two) times daily with a meal.   gabapentin 300 MG capsule Commonly known as:  NEURONTIN Take 600 mg by mouth 3 (three) times daily.   ipratropium 17 MCG/ACT inhaler Commonly known as:  ATROVENT HFA Inhale 2 puffs into the lungs every 6 (six) hours as needed for wheezing.   ipratropium-albuterol 0.5-2.5 (3) MG/3ML Soln Commonly known as:  DUONEB Take 0.5 mg by nebulization every 6 (six) hours as needed.   nortriptyline 25 MG capsule Commonly known as:  PAMELOR Take 25 mg by mouth at bedtime.   OXYGEN Inhale 3 L into the lungs continuous.   pantoprazole 40 MG tablet Commonly known as:  PROTONIX Take 1 tablet (40 mg total) by mouth daily.   predniSONE 10 MG (21) Tbpk tablet Commonly known as:  STERAPRED UNI-PAK 21 TAB Start 60 mg po daily, taper 10 mg daily until done Replaces:  predniSONE 10 MG tablet Notes to patient:  Take one less pill each morning until completed. Ex Day 1=6 tablets, day 2=5 tablets, etc....   simvastatin 40 MG tablet Commonly known as:  ZOCOR Take 40 mg by mouth at bedtime.   tiotropium 18 MCG inhalation capsule Commonly known as:  SPIRIVA Place 18 mcg into inhaler and  inhale daily.        DISCHARGE INSTRUCTIONS:   DIET:  Cardiac diet DISCHARGE CONDITION:  Good ACTIVITY:  Activity as tolerated OXYGEN:  Home Oxygen: Yes.    Oxygen Delivery: 2 liters via [place] DISCHARGE LOCATION:  home with home health  If you experience worsening of your admission symptoms, develop shortness of breath, life threatening emergency, suicidal or homicidal thoughts you must seek medical attention immediately by calling 911 or calling your MD immediately  if symptoms less  severe.  You Must read complete instructions/literature along with all the possible adverse reactions/side effects for all the Medicines you take and that have been prescribed to you. Take any new Medicines after you have completely understood and accpet all the possible adverse reactions/side effects.   Please note  You were cared for by a hospitalist during your hospital stay. If you have any questions about your discharge medications or the care you received while you were in the hospital after you are discharged, you can call the unit and asked to speak with the hospitalist on call if the hospitalist that took care of you is not available. Once you are discharged, your primary care physician will handle any further medical issues. Please note that NO REFILLS for any discharge medications will be authorized once you are discharged, as it is imperative that you return to your primary care physician (or establish a relationship with a primary care physician if you do not have one) for your aftercare needs so that they can reassess your need for medications and monitor your lab values.    On the day of Discharge:  VITAL SIGNS:  Blood pressure 124/68, pulse 82, temperature 98.2 F (36.8 C), temperature source Oral, resp. rate 18, height 5' (1.524 m), weight 56 kg, SpO2 95 %. PHYSICAL EXAMINATION:  GENERAL:  61 y.o.-year-old patient lying in the bed with no acute distress.  EYES: Pupils equal, round, reactive to light and accommodation. No scleral icterus. Extraocular muscles intact.  HEENT: Head atraumatic, normocephalic. Oropharynx and nasopharynx clear.  NECK:  Supple, no jugular venous distention. No thyroid enlargement, no tenderness.  LUNGS: Normal breath sounds bilaterally, no wheezing, rales,rhonchi or crepitation. No use of accessory muscles of respiration.  CARDIOVASCULAR: S1, S2 normal. No murmurs, rubs, or gallops.  ABDOMEN: Soft, non-tender, non-distended. Bowel sounds present. No  organomegaly or mass.  EXTREMITIES: No pedal edema, cyanosis, or clubbing.  NEUROLOGIC: Cranial nerves II through XII are intact. Muscle strength 5/5 in all extremities. Sensation intact. Gait not checked.  PSYCHIATRIC: The patient is alert and oriented x 3.  SKIN: No obvious rash, lesion, or ulcer.  DATA REVIEW:   CBC Recent Labs  Lab 09/16/18 0451  WBC 7.9  HGB 9.4*  HCT 29.6*  PLT 272    Chemistries  Recent Labs  Lab 09/16/18 0451  NA 140  K 3.7  CL 102  CO2 32  GLUCOSE 110*  BUN 11  CREATININE 0.41*  CALCIUM 8.1*    Follow-up [place], [facility]. Go on 09/24/2018.   Specialty:  General Practice Why:  @1 :10pm Contact information: [place] [address] [phone]        Erby Pian, MD. Go on 10/01/2018.   Specialty:  Specialist Why:  @10 :45a Contact information: [place] [address] [phone]           Management plans discussed with the patient, family and they are in agreement.  CODE STATUS:  Prior   TOTAL TIME TAKING CARE OF THIS PATIENT: 45 minutes.    Max Sane M.D on 09/21/2018 at 5:28 PM  Between 7am to 6pm - Pager - [phone]  After 6pm go to [email or web address] - Technical brewer  [facility]  Office  [phone]  CC: Primary care physician; Center, [facility]   Note: This dictation was prepared with Dragon dictation along with smaller phrase technology. Any transcriptional errors that result from this process are unintentional.

## 2018-10-08 ENCOUNTER — Other Ambulatory Visit: Payer: Self-pay | Admitting: Specialist

## 2018-10-08 DIAGNOSIS — R0602 Shortness of breath: Secondary | ICD-10-CM

## 2018-10-08 DIAGNOSIS — R059 Cough, unspecified: Secondary | ICD-10-CM

## 2018-10-08 DIAGNOSIS — R05 Cough: Secondary | ICD-10-CM

## 2018-10-08 DIAGNOSIS — R911 Solitary pulmonary nodule: Secondary | ICD-10-CM

## 2018-10-08 DIAGNOSIS — Z9981 Dependence on supplemental oxygen: Secondary | ICD-10-CM

## 2018-10-26 ENCOUNTER — Other Ambulatory Visit: Payer: Self-pay | Admitting: Specialist

## 2018-10-26 DIAGNOSIS — R0602 Shortness of breath: Secondary | ICD-10-CM

## 2018-10-26 DIAGNOSIS — R911 Solitary pulmonary nodule: Secondary | ICD-10-CM

## 2018-10-26 DIAGNOSIS — R059 Cough, unspecified: Secondary | ICD-10-CM

## 2018-10-26 DIAGNOSIS — Z9981 Dependence on supplemental oxygen: Secondary | ICD-10-CM

## 2018-10-26 DIAGNOSIS — R05 Cough: Secondary | ICD-10-CM

## 2018-11-02 ENCOUNTER — Ambulatory Visit
Admission: RE | Admit: 2018-11-02 | Discharge: 2018-11-02 | Disposition: A | Discharge status: Home / Self Care | Payer: Medicare HMO | Source: Ambulatory Visit | Attending: Specialist | Admitting: Specialist

## 2018-11-02 DIAGNOSIS — R059 Cough, unspecified: Secondary | ICD-10-CM

## 2018-11-02 DIAGNOSIS — Z9981 Dependence on supplemental oxygen: Secondary | ICD-10-CM | POA: Insufficient documentation

## 2018-11-02 DIAGNOSIS — R911 Solitary pulmonary nodule: Secondary | ICD-10-CM

## 2018-11-02 DIAGNOSIS — R0602 Shortness of breath: Secondary | ICD-10-CM | POA: Diagnosis present

## 2018-11-02 DIAGNOSIS — R05 Cough: Secondary | ICD-10-CM | POA: Diagnosis present

## 2018-11-02 LAB — POCT I-STAT CREATININE: CREATININE: 0.6 mg/dL (ref 0.44–1.00)

## 2018-11-02 MED ORDER — IOPAMIDOL (ISOVUE-300) INJECTION 61%
75.0000 mL | Freq: Once | INTRAVENOUS | Status: AC | PRN
  Administered 2018-11-02: 75 mL via INTRAVENOUS

## 2018-11-16 ENCOUNTER — Other Ambulatory Visit: Payer: Self-pay | Admitting: Nurse Practitioner

## 2018-11-16 DIAGNOSIS — Z1231 Encounter for screening mammogram for malignant neoplasm of breast: Secondary | ICD-10-CM

## 2018-12-01 ENCOUNTER — Other Ambulatory Visit: Payer: Self-pay | Admitting: Specialist

## 2018-12-01 ENCOUNTER — Other Ambulatory Visit (HOSPITAL_COMMUNITY): Payer: Self-pay | Admitting: Specialist

## 2018-12-01 DIAGNOSIS — J849 Interstitial pulmonary disease, unspecified: Secondary | ICD-10-CM

## 2018-12-07 ENCOUNTER — Other Ambulatory Visit: Payer: Self-pay | Admitting: Family Medicine

## 2018-12-07 DIAGNOSIS — Z1231 Encounter for screening mammogram for malignant neoplasm of breast: Secondary | ICD-10-CM

## 2018-12-21 ENCOUNTER — Ambulatory Visit
Admission: RE | Admit: 2018-12-21 | Discharge: 2018-12-21 | Disposition: A | Discharge status: Home / Self Care | Payer: Medicare HMO | Source: Ambulatory Visit | Attending: Family Medicine | Admitting: Family Medicine

## 2018-12-21 DIAGNOSIS — Z1231 Encounter for screening mammogram for malignant neoplasm of breast: Secondary | ICD-10-CM

## 2019-03-01 ENCOUNTER — Ambulatory Visit: Admission: RE | Admit: 2019-03-01 | Payer: Medicare HMO | Source: Ambulatory Visit

## 2019-03-02 ENCOUNTER — Ambulatory Visit: Payer: Medicare HMO

## 2019-05-10 ENCOUNTER — Other Ambulatory Visit: Payer: Self-pay | Admitting: Specialist

## 2019-05-10 DIAGNOSIS — J449 Chronic obstructive pulmonary disease, unspecified: Secondary | ICD-10-CM

## 2019-05-10 DIAGNOSIS — R918 Other nonspecific abnormal finding of lung field: Secondary | ICD-10-CM

## 2019-05-10 DIAGNOSIS — R0609 Other forms of dyspnea: Secondary | ICD-10-CM

## 2019-05-17 ENCOUNTER — Other Ambulatory Visit: Payer: Self-pay

## 2019-05-17 ENCOUNTER — Ambulatory Visit
Admission: RE | Admit: 2019-05-17 | Discharge: 2019-05-17 | Disposition: A | Discharge status: Home / Self Care | Payer: Medicare HMO | Source: Ambulatory Visit | Attending: Specialist | Admitting: Specialist

## 2019-05-17 DIAGNOSIS — R918 Other nonspecific abnormal finding of lung field: Secondary | ICD-10-CM | POA: Diagnosis present

## 2019-05-17 DIAGNOSIS — R0609 Other forms of dyspnea: Secondary | ICD-10-CM | POA: Diagnosis present

## 2019-05-17 DIAGNOSIS — J449 Chronic obstructive pulmonary disease, unspecified: Secondary | ICD-10-CM | POA: Diagnosis present

## 2019-05-17 LAB — POCT I-STAT CREATININE: Creatinine, Ser: 0.6 mg/dL (ref 0.44–1.00)

## 2019-05-17 MED ORDER — IOHEXOL 300 MG/ML  SOLN
75.0000 mL | Freq: Once | INTRAMUSCULAR | Status: AC | PRN
  Administered 2019-05-17: 75 mL via INTRAVENOUS

## 2019-05-25 ENCOUNTER — Other Ambulatory Visit: Payer: Self-pay | Admitting: Specialist

## 2019-05-25 DIAGNOSIS — J479 Bronchiectasis, uncomplicated: Secondary | ICD-10-CM

## 2019-05-25 DIAGNOSIS — J439 Emphysema, unspecified: Secondary | ICD-10-CM

## 2019-10-08 ENCOUNTER — Ambulatory Visit
Admission: RE | Admit: 2019-10-08 | Discharge: 2019-10-08 | Disposition: A | Discharge status: Home / Self Care | Payer: Medicare HMO | Source: Ambulatory Visit | Attending: Specialist | Admitting: Specialist

## 2019-10-08 ENCOUNTER — Other Ambulatory Visit: Payer: Self-pay

## 2019-10-08 DIAGNOSIS — J479 Bronchiectasis, uncomplicated: Secondary | ICD-10-CM | POA: Insufficient documentation

## 2019-10-08 DIAGNOSIS — J439 Emphysema, unspecified: Secondary | ICD-10-CM | POA: Diagnosis present

## 2020-05-29 ENCOUNTER — Emergency Department (HOSPITAL_COMMUNITY)
Admission: EM | Admit: 2020-05-29 | Discharge: 2020-05-29 | Disposition: A | Discharge status: Home / Self Care | Payer: Medicare HMO | Attending: Emergency Medicine | Admitting: Emergency Medicine

## 2020-05-29 ENCOUNTER — Other Ambulatory Visit: Payer: Self-pay

## 2020-05-29 ENCOUNTER — Emergency Department (HOSPITAL_COMMUNITY): Payer: Medicare HMO

## 2020-05-29 ENCOUNTER — Encounter (HOSPITAL_COMMUNITY): Payer: Self-pay | Admitting: Emergency Medicine

## 2020-05-29 DIAGNOSIS — R935 Abnormal findings on diagnostic imaging of other abdominal regions, including retroperitoneum: Secondary | ICD-10-CM | POA: Diagnosis not present

## 2020-05-29 DIAGNOSIS — C482 Malignant neoplasm of peritoneum, unspecified: Secondary | ICD-10-CM | POA: Insufficient documentation

## 2020-05-29 DIAGNOSIS — Z79899 Other long term (current) drug therapy: Secondary | ICD-10-CM | POA: Diagnosis not present

## 2020-05-29 DIAGNOSIS — J449 Chronic obstructive pulmonary disease, unspecified: Secondary | ICD-10-CM | POA: Insufficient documentation

## 2020-05-29 DIAGNOSIS — Z87891 Personal history of nicotine dependence: Secondary | ICD-10-CM | POA: Diagnosis not present

## 2020-05-29 DIAGNOSIS — R1084 Generalized abdominal pain: Secondary | ICD-10-CM | POA: Insufficient documentation

## 2020-05-29 DIAGNOSIS — R109 Unspecified abdominal pain: Secondary | ICD-10-CM | POA: Diagnosis present

## 2020-05-29 DIAGNOSIS — I10 Essential (primary) hypertension: Secondary | ICD-10-CM | POA: Insufficient documentation

## 2020-05-29 DIAGNOSIS — C786 Secondary malignant neoplasm of retroperitoneum and peritoneum: Secondary | ICD-10-CM

## 2020-05-29 LAB — URINALYSIS, ROUTINE W REFLEX MICROSCOPIC
Bilirubin Urine: NEGATIVE
Glucose, UA: NEGATIVE mg/dL
Hgb urine dipstick: NEGATIVE
Ketones, ur: NEGATIVE mg/dL
Leukocytes,Ua: NEGATIVE
Nitrite: NEGATIVE
Protein, ur: NEGATIVE mg/dL
Specific Gravity, Urine: 1.005 (ref 1.005–1.030)
pH: 8 (ref 5.0–8.0)

## 2020-05-29 LAB — COMPREHENSIVE METABOLIC PANEL
ALT: 12 U/L (ref 0–44)
AST: 21 U/L (ref 15–41)
Albumin: 3.8 g/dL (ref 3.5–5.0)
Alkaline Phosphatase: 85 U/L (ref 38–126)
Anion gap: 10 (ref 5–15)
BUN: 8 mg/dL (ref 8–23)
CO2: 32 mmol/L (ref 22–32)
Calcium: 9.4 mg/dL (ref 8.9–10.3)
Chloride: 99 mmol/L (ref 98–111)
Creatinine, Ser: 0.47 mg/dL (ref 0.44–1.00)
GFR calc Af Amer: >60 mL/min (ref >60)
GFR calc non Af Amer: >60 mL/min (ref >60)
Glucose, Bld: 101 mg/dL — ABNORMAL HIGH (ref 70–99)
Potassium: 4.1 mmol/L (ref 3.5–5.1)
Sodium: 141 mmol/L (ref 135–145)
Total Bilirubin: 0.3 mg/dL (ref 0.3–1.2)
Total Protein: 6.6 g/dL (ref 6.5–8.1)

## 2020-05-29 LAB — CBC
HCT: 41.3 % (ref 36.0–46.0)
Hemoglobin: 12.8 g/dL (ref 12.0–15.0)
MCH: 28.3 pg (ref 26.0–34.0)
MCHC: 31 g/dL (ref 30.0–36.0)
MCV: 91.4 fL (ref 80.0–100.0)
Platelets: 400 10*3/uL (ref 150–400)
RBC: 4.52 MIL/uL (ref 3.87–5.11)
RDW: 13.6 % (ref 11.5–15.5)
WBC: 10.5 10*3/uL (ref 4.0–10.5)
nRBC: 0 % (ref 0.0–0.2)

## 2020-05-29 LAB — LIPASE, BLOOD: Lipase: 22 U/L (ref 11–51)

## 2020-05-29 MED ORDER — IOHEXOL 300 MG/ML  SOLN
100.0000 mL | Freq: Once | INTRAMUSCULAR | Status: AC | PRN
  Administered 2020-05-29: 100 mL via INTRAVENOUS

## 2020-05-29 NOTE — ED Notes (Signed)
IV attempt x 2 right arm unsuccessful

## 2020-05-29 NOTE — ED Provider Notes (Signed)
[facility] EMERGENCY DEPARTMENT Provider Note   CSN: [id number] Arrival date & time: 05/29/20  5956     History No chief complaint on file.   [name] is a 64 y.o. female with PMHx HTN, high cholesterol, and COPD who presents to the ED today with complaint of gradual onset, intermittent, diffuse abdominal pain x 1 month, worsening last night.  Patient reports that she has been having some sharp intermittent abdominal pain for about 1 month, saw her PCP who prescribed her Bentyl.  She states that this has been helping however last night she began having worsening pain and was unable to crawl control it at home prompting her to call EMS today.  She states she has an appointment at the end of the month with GI.  She has never had an endoscopy or colonoscopy in the past.  She denies any exacerbating factors.  She cannot associate any kind of pattern with her abdominal pain.  Last normal bowel movement was 2 days ago.  She states that this is her new baseline since her abdominal pain started.  Patient denies fevers, chills, nausea, vomiting, diarrhea, urinary symptoms, any other associated symptoms.  No previous abdominal surgeries.  The history is provided by the patient and medical records.       Past Medical History:  Diagnosis Date  . COPD (chronic obstructive pulmonary disease) ([place])   . High cholesterol   . Hypertension     Patient Active Problem List   Diagnosis Date Noted  . Pneumonia 09/13/2018  . HCAP (healthcare-associated pneumonia) 01/29/2018  . Acute on chronic respiratory failure ([place]) 01/22/2018  . CAP (community acquired pneumonia) 01/18/2016  . Anemia, iron deficiency 11/07/2015  . Chronic respiratory failure ([place]) 11/07/2015  . Acute on chronic respiratory failure with hypoxia ([place]) 11/07/2015  . Hyperlipidemia 11/07/2015  . COPD with acute exacerbation ([place]) 11/06/2015  . Hypertension 11/06/2015    Past Surgical History:  Procedure Laterality Date  .  HALLUX VALGUS BASE WEDGE Right 06/09/2015   Procedure: Base wedge osteotomy with modified McBride right foot ;  Surgeon: Sharlotte Alamo, MD;  Location: [facility] ORS;  Service: Podiatry;  Laterality: Right;     OB History   No obstetric history on file.     Family History  Problem Relation Age of Onset  . Alzheimer's disease Mother   . COPD Father   . Breast cancer Neg Hx     Social History   Tobacco Use  . Smoking status: Former Smoker    Quit date: 11/04/2013    Years since quitting: 6.5  . Smokeless tobacco: Never Used  Vaping Use  . Vaping Use: Never used  Substance Use Topics  . Alcohol use: No  . Drug use: No    Home Medications Prior to Admission medications   Medication Sig Start Date End Date Taking? Authorizing Provider  dicyclomine (BENTYL) 10 MG capsule Take 10 mg by mouth in the morning, at noon, in the evening, and at bedtime.   Yes [provider]  albuterol (PROVENTIL HFA;VENTOLIN HFA) 108 (90 BASE) MCG/ACT inhaler Inhale 4-6 puffs into the lungs every 6 (six) hours as needed for wheezing or shortness of breath.    [provider]  amLODipine (NORVASC) 5 MG tablet Take 5 mg by mouth at bedtime.     [provider]  aspirin EC 81 MG tablet Take 81 mg by mouth daily.    [provider]  ferrous sulfate 325 (65 FE) MG tablet  Take 325 mg by mouth 2 (two) times daily with a meal.     [provider]  fluticasone furoate-vilanterol (BREO ELLIPTA) 100-25 MCG/INH AEPB Inhale 1 puff into the lungs daily.    [provider]  gabapentin (NEURONTIN) 300 MG capsule Take 600 mg by mouth 3 (three) times daily.    [provider]  ipratropium (ATROVENT HFA) 17 MCG/ACT inhaler Inhale 2 puffs into the lungs every 6 (six) hours as needed for wheezing.    [provider]  ipratropium-albuterol (DUONEB) 0.5-2.5 (3) MG/3ML SOLN Take 0.5 mg by nebulization every 6 (six) hours as needed. 08/03/12   [provider]    nortriptyline (PAMELOR) 25 MG capsule Take 25 mg by mouth at bedtime.    [provider]  OXYGEN Inhale 3 L into the lungs continuous.     [provider]  pantoprazole (PROTONIX) 40 MG tablet Take 1 tablet (40 mg total) by mouth daily. 01/28/18   Gladstone Lighter, MD  predniSONE (STERAPRED UNI-PAK 21 TAB) 10 MG (21) TBPK tablet Start 60 mg po daily, taper 10 mg daily until done 09/16/18   Max Sane, MD  simvastatin (ZOCOR) 40 MG tablet Take 40 mg by mouth at bedtime.    [provider]  tiotropium (SPIRIVA) 18 MCG inhalation capsule Place 18 mcg into inhaler and inhale daily.    [provider]    Allergies    Patient has no known allergies.  Review of Systems   Review of Systems  Constitutional: Negative for chills and fever.  Gastrointestinal: Positive for abdominal pain. Negative for blood in stool, constipation, diarrhea, nausea and vomiting.  Genitourinary: Negative for difficulty urinating, dysuria and flank pain.  All other systems reviewed and are negative.   Physical Exam Updated Vital Signs BP (!) 145/85 (BP Location: Right Arm)   Pulse 100   Temp 98.6 F (37 C) (Oral)   Resp 18   Ht 5' (1.524 m)   Wt 56.7 kg   SpO2 98%   BMI 24.41 kg/m   Physical Exam Vitals and nursing note reviewed.  Constitutional:      Appearance: She is not ill-appearing or diaphoretic.  HENT:     Head: Normocephalic and atraumatic.  Eyes:     Conjunctiva/sclera: Conjunctivae normal.  Cardiovascular:     Rate and Rhythm: Normal rate and regular rhythm.     Pulses: Normal pulses.  Pulmonary:     Effort: Pulmonary effort is normal.     Breath sounds: Normal breath sounds. No wheezing, rhonchi or rales.     Comments: On chronic home O2 Abdominal:     Palpations: Abdomen is soft.     Tenderness: There is abdominal tenderness. There is no right CVA tenderness, left CVA tenderness, guarding or rebound.     Comments: Soft, + TTP diffusely however  worse to LUQ and suprapubic area, +BS throughout, no r/g/r, neg murphy's, neg mcburney's, no CVA TTP   Musculoskeletal:     Cervical back: Neck supple.  Skin:    General: Skin is warm and dry.  Neurological:     Mental Status: She is alert.     ED Results / Procedures / Treatments   Labs (all labs ordered are listed, but only abnormal results are displayed) Labs Reviewed  COMPREHENSIVE METABOLIC PANEL - Abnormal; Notable for the following components:      Result Value   Glucose, Bld 101 (*)    All other components within normal limits  URINALYSIS, ROUTINE  W REFLEX MICROSCOPIC - Abnormal; Notable for the following components:   Color, Urine STRAW (*)    All other components within normal limits  LIPASE, BLOOD  CBC  CA 125  CEA  CANCER ANTIGEN 19-9    EKG None  Radiology CT Abdomen Pelvis W Contrast  Result Date: 05/29/2020 CLINICAL DATA:  Nonlocalized abdominal pain for 1.2 months. History of COPD and hypertension. No given history of malignancy. EXAM: CT ABDOMEN AND PELVIS WITH CONTRAST TECHNIQUE: Multidetector CT imaging of the abdomen and pelvis was performed using the standard protocol following bolus administration of intravenous contrast. CONTRAST:  137mL OMNIPAQUE IOHEXOL 300 MG/ML  SOLN COMPARISON:  Chest CT 10/08/2019. FINDINGS: Lower chest: Chronic lung disease at both lung bases appears similar to the previous studies. There is left greater than right bronchiectasis and peribronchovascular nodularity. Trace pleural fluid on the left. Small hiatal hernia with grossly stable small paraesophageal lymph nodes. Hepatobiliary: The liver is normal in density without suspicious focal abnormality. Probable small subcapsular cyst peripherally in the right hepatic lobe, best seen on coronal image 50/5. The gallbladder is incompletely distended. No evidence of gallbladder wall thickening, gallstones or biliary dilatation. Pancreas: Unremarkable. No pancreatic ductal dilatation or  surrounding inflammatory changes. Spleen: Normal in size without focal abnormality. Adrenals/Urinary Tract: Both adrenal glands appear normal. The kidneys appear normal without evidence of urinary tract calculus, suspicious lesion or hydronephrosis. No bladder abnormalities are seen. Stomach/Bowel: No evidence of bowel wall thickening, distention or surrounding inflammatory change. No primary bowel malignancy or obstruction identified. The appendix appears normal. There are descending and sigmoid colon diverticular changes and a moderate amount of stool throughout the colon. Vascular/Lymphatic: There are no enlarged abdominal or pelvic lymph nodes. There are few mildly prominent mesenteric lymph nodes, measuring up to 10 mm on image 39/2. Diffuse aortic and branch vessel atherosclerosis without acute vascular findings. The portal, superior mesenteric and splenic veins are patent. Reproductive: Globular enlargement of the uterus with focal calcifications anteriorly, likely related to fibroids. Both ovaries are prominent without well-defined ovarian malignancy. Other: There is extensive nodularity throughout the omentum and upper peritoneal cavity highly suspicious for peritoneal carcinomatosis. Largest midline component measures 4.8 x 2.7 cm on image 30/2. There are components within the base of the mesentery and in the subhepatic space, medial to the gallbladder. A small amount of free pelvic ascites is present. No free intraperitoneal air. Small umbilical hernia containing only fat. Musculoskeletal: No acute or significant osseous findings. IMPRESSION: 1. Extensive nodularity throughout the omentum and upper peritoneal cavity highly suspicious for peritoneal carcinomatosis. 2. No primary bowel or parenchymal malignancy identified. Both ovaries are prominent, and ovarian malignancy is a possibility, although no well-defined adnexal mass is demonstrated. Tissue sampling of the omental disease and GYN evaluation  suggested. 3. Chronic lung disease at both lung bases with left greater than right bronchiectasis and peribronchovascular nodularity, similar to previous chest CTs. 4. Aortic Atherosclerosis (ICD10-I70.0). 5. These results were called by telephone at the time of interpretation on 05/29/2020 at 1:47 pm to provider Felisia Balcom , who verbally acknowledged these results. Electronically Signed   By: Richardean Sale M.D.   On: 05/29/2020 13:47    Procedures Procedures (including critical care time)  Medications Ordered in ED Medications  iohexol (OMNIPAQUE) 300 MG/ML solution 100 mL (100 mLs Intravenous Contrast Given 05/29/20 1312)    ED Course  I have reviewed the triage vital signs and the nursing notes.  Pertinent labs & imaging results that were available during my  care of the patient were reviewed by me and considered in my medical decision making (see chart for details).  Clinical Course as of May 30 1511  Mon May 29, 2020  1427 Discussed case with oncologist [name] who recommends cancer markers: CEA, CA 125, and CA 19-9. Will order.    [MV]    Clinical Course User Index [MV] Eustaquio Maize, PA-C   MDM Rules/Calculators/A&P                          64 year old female who presents to the ED today with intermittent diffuse abdominal pain for the past month, worsened yesterday.  No other associated symptoms.  Has a appointment scheduled with GI in 2 weeks.  On arrival to the ED patient is afebrile and nontachypneic.  She is tachycardic at exactly 100 bpm however this is resolved while patient is in her room.  She is on chronic home O2 and satting 98% on room air.  She does have some diffuse abdominal tenderness however no rebound or guarding.  Her tenderness is worse in the left upper quadrant and suprapubic areas.  Will obtain lab work at this time and reassess.  May consider imaging given age.  CBC without leukocytosis. Hgb stable 12.8 CMP without electrolyte  abnormalities Lipase 22 U/A without infection  CT scan ordered eventually - received call from radiologist with abnormal findings concerning for omental carcinomatosis; no primary source appreciated. Will consult oncology at this time.   Oncology with recommendations as above. Will order labs at this time prior to discharge. Have updated pt and family on findings at this time. All questions were answered in a timely manner. She understands the importance of following up with oncology and is awaiting their call at this time. She is instructed on strict return precautions and stable for discharge at this time.   Prior to being discharged oncology called to schedule an appointment for July 20th. Pt made aware.   This note was prepared using Dragon voice recognition software and may include unintentional dictation errors due to the inherent limitations of voice recognition software.  Final Clinical Impression(s) / ED Diagnoses Final diagnoses:  Generalized abdominal pain  Abnormal abdominal CT scan  Peritoneal carcinomatosis ([place])    Rx / DC Orders ED Discharge Orders    None       Discharge Instructions     Your CT scan showed concern for cancer. Dr[facility] will contact you to schedule a follow up appointment - you can expect their call this week.   Return to the ED for any worsening symptoms including worsening pain, constipation, not passing gas, fevers > 100.4, or any other new/concerning symptoms.        Eustaquio Maize, PA-C 05/29/20 1526    Virgel Manifold, MD 06/04/20 1126

## 2020-05-29 NOTE — Discharge Instructions (Addendum)
Your CT scan showed concern for cancer. Go to your appointment as scheduled on July 20th.   Return to the ED for any worsening symptoms including worsening pain, constipation, not passing gas, fevers > 100.4, or any other new/concerning symptoms.

## 2020-05-29 NOTE — ED Triage Notes (Signed)
ABD pain X 12 hours lower ABD cramping denies N/V.  Last BM day before yesterday.

## 2020-05-30 LAB — CANCER ANTIGEN 19-9: CA 19-9: <2 U/mL (ref 0–35)

## 2020-05-30 LAB — CEA: CEA: 2.4 ng/mL (ref 0.0–4.7)

## 2020-05-30 LAB — CA 125: Cancer Antigen (CA) 125: 2407 U/mL — ABNORMAL HIGH (ref 0.0–38.1)

## 2020-06-05 ENCOUNTER — Encounter (HOSPITAL_COMMUNITY): Payer: Self-pay

## 2020-06-05 ENCOUNTER — Other Ambulatory Visit: Payer: Self-pay

## 2020-06-05 NOTE — Progress Notes (Signed)
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 Virgel Manifold at [facility]. She went to [facility] on 05/29/2020 after complaining about diffuse, intermittent abdominal pain for the preceding 1 month.  Today she is accompanied by her partner. She reports that her abdominal pain is in the RLQ, and it was sharp pain when she went to the ED. She denies having any vaginal discharge. She denies melena, hematochezia or hematuria. She denies any weight loss in the past 6 months. She denies N/V and her appetite is good. She reports feeling full before finishing her meal. She reports having numbness in her feet occasionally. She denies having any issues urinating. She is currently on 3 L via . She has an appointment with a gastroenterologist in [place] on 06/12/2020.  She stopped menstruating in her late 81s. 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 She quit smoking in 2014 and smoked 1 PPD for about 40 years. Her paternal GM had colon cancer.   MEDICAL HISTORY:  Past Medical History:  Diagnosis Date   Anemia    COPD (chronic obstructive pulmonary disease) (HCC)    High cholesterol    Hypertension    Neuropathy     SURGICAL HISTORY: Past Surgical History:  Procedure Laterality Date   HALLUX VALGUS BASE WEDGE Right 06/09/2015   Procedure: Base wedge osteotomy with modified McBride right foot ;  Surgeon: Sharlotte Alamo, MD;  Location: [facility] ORS;  Service: Podiatry;  Laterality: Right;    SOCIAL HISTORY: Social History   Socioeconomic History   Marital status: Divorced    Spouse name: Not on file     Number of children: 3   Years of education: Not on file   Highest education level: Not on file  Occupational History   Occupation: DISABLED  Tobacco Use   Smoking status: Former Smoker    Quit date: 11/04/2013    Years since quitting: 6.5   Smokeless tobacco: Never Used  Vaping Use   Vaping Use: Never used  Substance and Sexual Activity   Alcohol use: No   Drug use: No   Sexual activity: Not Currently  Other Topics Concern   Not on file  Social History Narrative   Not on file   Social Determinants of Health   Financial Resource Strain: Medium Risk   Difficulty of Paying Living Expenses: Somewhat hard  Food Insecurity: No Food Insecurity   Worried About Charity fundraiser in the Last Year: Never true   Ran Out of Food in the Last Year: Never true  Transportation Needs: No Transportation Needs   Lack of Transportation (Medical): No   Lack of Transportation (Non-Medical): No  Physical Activity: Inactive   Days of Exercise per Week: 0 days   Minutes of Exercise per Session: 0 min  Stress: Stress Concern Present   Feeling of Stress : To some extent  Social Connections: Moderately Isolated   Frequency of Communication with Friends and Family: More than three times a week   Frequency of Social Gatherings with Friends and Family: Never   Attends Religious Services: More than 4 times per year  Active Member of Clubs or Organizations: No   Attends Archivist Meetings: Never   Marital Status: Divorced  Human resources officer Violence: Not At Risk   Fear of Current or Ex-Partner: No   Emotionally Abused: No   Physically Abused: No   Sexually Abused: No    FAMILY HISTORY: Family History  Problem Relation Age of Onset   Alzheimer's disease Mother    COPD Father    Emphysema Father    Hypertension Father    Healthy Sister    Healthy Brother    Alzheimer's disease Maternal Grandmother    Colon cancer Paternal 62     Healthy Sister    Healthy Sister    Healthy Sister    Breast cancer Neg Hx     ALLERGIES:  has No Known Allergies.  MEDICATIONS:  Current Outpatient Medications  Medication Sig Dispense Refill   albuterol (PROVENTIL HFA;VENTOLIN HFA) 108 (90 BASE) MCG/ACT inhaler Inhale 4-6 puffs into the lungs every 6 (six) hours as needed for wheezing or shortness of breath. (Patient not taking: Reported on 06/05/2020)     amLODipine (NORVASC) 5 MG tablet Take 5 mg by mouth at bedtime.      aspirin EC 81 MG tablet Take 81 mg by mouth daily.     dicyclomine (BENTYL) 10 MG capsule Take 10 mg by mouth in the morning, at noon, in the evening, and at bedtime.     ferrous sulfate 325 (65 FE) MG tablet Take 325 mg by mouth 2 (two) times daily with a meal.      Fluticasone-Umeclidin-Vilant (TRELEGY ELLIPTA) 100-62.5-25 MCG/INH AEPB Inhale into the lungs.     gabapentin (NEURONTIN) 300 MG capsule Take 600 mg by mouth 3 (three) times daily.     ipratropium (ATROVENT HFA) 17 MCG/ACT inhaler Inhale 2 puffs into the lungs every 6 (six) hours as needed for wheezing. (Patient not taking: Reported on 06/05/2020)     ipratropium-albuterol (DUONEB) 0.5-2.5 (3) MG/3ML SOLN Take 0.5 mg by nebulization every 6 (six) hours as needed. (Patient not taking: Reported on 06/05/2020)     nortriptyline (PAMELOR) 25 MG capsule Take 25 mg by mouth at bedtime.     OXYGEN Inhale 3 L into the lungs continuous.      pantoprazole (PROTONIX) 40 MG tablet Take 1 tablet (40 mg total) by mouth daily. 30 tablet 2   predniSONE (STERAPRED UNI-PAK 21 TAB) 10 MG (21) TBPK tablet Start 60 mg po daily, taper 10 mg daily until done 21 tablet 0   simvastatin (ZOCOR) 40 MG tablet Take 40 mg by mouth at bedtime.     No current facility-administered medications for this visit.    REVIEW OF SYSTEMS:   Review of Systems  Constitutional: Positive for fatigue (mild). Negative for appetite change and unexpected weight change.  Respiratory:  Positive for cough (d/t COPD) and shortness of breath (d/t COPD).   Gastrointestinal: Positive for abdominal pain (RLQ). Negative for blood in stool, nausea and vomiting.  Genitourinary: Negative for dysuria, hematuria and vaginal discharge.   Neurological: Positive for numbness (feet, occasional).  All other systems reviewed and are negative.    PHYSICAL EXAMINATION: ECOG PERFORMANCE STATUS: 1 - Symptomatic but completely ambulatory  Vitals:   06/06/20 1329  BP: (!) 164/83  Pulse: (!) 105  Resp: 20  Temp: (!) 96.8 F (36 C)  SpO2: 93%   Filed Weights   06/06/20 1329  Weight: 124 lb 8 oz (56.5 kg)   Physical Exam Vitals reviewed.  Constitutional:      Appearance: Normal appearance.  Cardiovascular:     Rate and Rhythm: Normal rate and regular rhythm.     Pulses: Normal pulses.     Heart sounds: Normal heart sounds.  Pulmonary:     Effort: Pulmonary effort is normal.     Breath sounds: Normal breath sounds.  Abdominal:     General: There is distension.     Palpations: Abdomen is soft. There is no mass.     Tenderness: There is no abdominal tenderness.  Musculoskeletal:     Right lower leg: No edema.     Left lower leg: No edema.  Neurological:     General: No focal deficit present.     Mental Status: She is alert and oriented to person, place, and time.  Psychiatric:        Mood and Affect: Mood normal.        Behavior: Behavior normal.      LABORATORY DATA:  I have reviewed the data as listed CBC Latest Ref Rng & Units 05/29/2020 09/16/2018 09/15/2018  WBC 4.0 - 10.5 K/uL 10.5 7.9 12.3(H)  Hemoglobin 12.0 - 15.0 g/dL 12.8 9.4(L) 10.5(L)  Hematocrit 36 - 46 % 41.3 29.6(L) 32.6(L)  Platelets 150 - 400 K/uL 400 272 269   CMP Latest Ref Rng & Units 05/29/2020 05/17/2019 11/02/2018  Glucose 70 - 99 mg/dL 101(H) - -  BUN 8 - 23 mg/dL 8 - -  Creatinine 0.44 - 1.00 mg/dL 0.47 0.60 0.60  Sodium 135 - 145 mmol/L 141 - -  Potassium 3.5 - 5.1 mmol/L 4.1 - -    Chloride 98 - 111 mmol/L 99 - -  CO2 22 - 32 mmol/L 32 - -  Calcium 8.9 - 10.3 mg/dL 9.4 - -  Total Protein 6.5 - 8.1 g/dL 6.6 - -  Total Bilirubin 0.3 - 1.2 mg/dL 0.3 - -  Alkaline Phos 38 - 126 U/L 85 - -  AST 15 - 41 U/L 21 - -  ALT 0 - 44 U/L 12 - -    RADIOGRAPHIC STUDIES: I have personally reviewed the radiological images as listed and agreed with the findings in the report. CT Abdomen Pelvis W Contrast  Result Date: 05/29/2020 CLINICAL DATA:  Nonlocalized abdominal pain for 1.2 months. History of COPD and hypertension. No given history of malignancy. EXAM: CT ABDOMEN AND PELVIS WITH CONTRAST TECHNIQUE: Multidetector CT imaging of the abdomen and pelvis was performed using the standard protocol following bolus administration of intravenous contrast. CONTRAST:  141mL OMNIPAQUE IOHEXOL 300 MG/ML  SOLN COMPARISON:  Chest CT 10/08/2019. FINDINGS: Lower chest: Chronic lung disease at both lung bases appears similar to the previous studies. There is left greater than right bronchiectasis and peribronchovascular nodularity. Trace pleural fluid on the left. Small hiatal hernia with grossly stable small paraesophageal lymph nodes. Hepatobiliary: The liver is normal in density without suspicious focal abnormality. Probable small subcapsular cyst peripherally in the right hepatic lobe, best seen on coronal image 50/5. The gallbladder is incompletely distended. No evidence of gallbladder wall thickening, gallstones or biliary dilatation. Pancreas: Unremarkable. No pancreatic ductal dilatation or surrounding inflammatory changes. Spleen: Normal in size without focal abnormality. Adrenals/Urinary Tract: Both adrenal glands appear normal. The kidneys appear normal without evidence of urinary tract calculus, suspicious lesion or hydronephrosis. No bladder abnormalities are seen. Stomach/Bowel: No evidence of bowel wall thickening, distention or surrounding inflammatory change. No primary bowel malignancy or  obstruction identified. The appendix appears normal. There are  descending and sigmoid colon diverticular changes and a moderate amount of stool throughout the colon. Vascular/Lymphatic: There are no enlarged abdominal or pelvic lymph nodes. There are few mildly prominent mesenteric lymph nodes, measuring up to 10 mm on image 39/2. Diffuse aortic and branch vessel atherosclerosis without acute vascular findings. The portal, superior mesenteric and splenic veins are patent. Reproductive: Globular enlargement of the uterus with focal calcifications anteriorly, likely related to fibroids. Both ovaries are prominent without well-defined ovarian malignancy. Other: There is extensive nodularity throughout the omentum and upper peritoneal cavity highly suspicious for peritoneal carcinomatosis. Largest midline component measures 4.8 x 2.7 cm on image 30/2. There are components within the base of the mesentery and in the subhepatic space, medial to the gallbladder. A small amount of free pelvic ascites is present. No free intraperitoneal air. Small umbilical hernia containing only fat. Musculoskeletal: No acute or significant osseous findings. IMPRESSION: 1. Extensive nodularity throughout the omentum and upper peritoneal cavity highly suspicious for peritoneal carcinomatosis. 2. No primary bowel or parenchymal malignancy identified. Both ovaries are prominent, and ovarian malignancy is a possibility, although no well-defined adnexal mass is demonstrated. Tissue sampling of the omental disease and GYN evaluation suggested. 3. Chronic lung disease at both lung bases with left greater than right bronchiectasis and peribronchovascular nodularity, similar to previous chest CTs. 4. Aortic Atherosclerosis (ICD10-I70.0). 5. These results were called by telephone at the time of interpretation on 05/29/2020 at 1:47 pm to provider MARGAUX VENTER , who verbally acknowledged these results. Electronically Signed   By: Richardean Sale M.D.    On: 05/29/2020 13:47    ASSESSMENT:  1.  Peritoneal carcinomatosis: -Presentation to the ER with right lower quadrant abdominal pain on and off for 1 month. -CTAP on 05/29/2020 showed extensive nodularity throughout the omentum and upper peritoneal cavity.  Both ovaries are prominent although no well-defined mass is noted. -CA-125 is elevated at 2407.  CEA was 2.4.  2.  COPD: -Quit smoking 6 years ago.  Smoked 2 packs/day for 25-30 years. -Has been on oxygen for the last 5 years.  3.  Family history: -Paternal grandmother with colon cancer.   PLAN:  1.  Peritoneal carcinomatosis: -I have reviewed results of the CT and images with the patient and her boyfriend. -Given the elevated CA-125, either primary peritoneal carcinomatosis or ovarian malignancy is highly likely. -I have recommended ultrasound-guided/CT guided biopsy of the peritoneal mass. -I have recommended port placement for chemotherapy administration. -We will also obtain a CT scan of the chest with contrast to complete staging. -We will also consider GYN oncology evaluation. -We will also consider geneticist evaluation and testing.    All questions were answered. The patient knows to call the clinic with any problems, questions or concerns.   Derek Jack, MD, 06/06/20 1:51 PM  [place] [phone]   I, Milinda Antis, am acting as a scribe for Dr. Sanda Linger.  I, Derek Jack MD, have reviewed the above documentation for accuracy and completeness, and I agree with the above.

## 2020-06-06 ENCOUNTER — Encounter (HOSPITAL_COMMUNITY): Payer: Self-pay | Admitting: Hematology

## 2020-06-06 ENCOUNTER — Inpatient Hospital Stay (HOSPITAL_COMMUNITY): Payer: Medicare HMO | Attending: Hematology | Admitting: Hematology

## 2020-06-06 DIAGNOSIS — E7800 Pure hypercholesterolemia, unspecified: Secondary | ICD-10-CM | POA: Diagnosis not present

## 2020-06-06 DIAGNOSIS — Z8249 Family history of ischemic heart disease and other diseases of the circulatory system: Secondary | ICD-10-CM | POA: Insufficient documentation

## 2020-06-06 DIAGNOSIS — Z7982 Long term (current) use of aspirin: Secondary | ICD-10-CM | POA: Diagnosis not present

## 2020-06-06 DIAGNOSIS — Z800 Family history of malignant neoplasm of digestive organs: Secondary | ICD-10-CM | POA: Insufficient documentation

## 2020-06-06 DIAGNOSIS — C786 Secondary malignant neoplasm of retroperitoneum and peritoneum: Secondary | ICD-10-CM | POA: Diagnosis not present

## 2020-06-06 DIAGNOSIS — C801 Malignant (primary) neoplasm, unspecified: Secondary | ICD-10-CM | POA: Diagnosis not present

## 2020-06-06 DIAGNOSIS — J449 Chronic obstructive pulmonary disease, unspecified: Secondary | ICD-10-CM | POA: Diagnosis not present

## 2020-06-06 DIAGNOSIS — Z87891 Personal history of nicotine dependence: Secondary | ICD-10-CM | POA: Diagnosis not present

## 2020-06-06 DIAGNOSIS — Z79899 Other long term (current) drug therapy: Secondary | ICD-10-CM | POA: Diagnosis not present

## 2020-06-06 DIAGNOSIS — I10 Essential (primary) hypertension: Secondary | ICD-10-CM | POA: Insufficient documentation

## 2020-06-06 DIAGNOSIS — Z7952 Long term (current) use of systemic steroids: Secondary | ICD-10-CM | POA: Insufficient documentation

## 2020-06-06 NOTE — Patient Instructions (Addendum)
[name] at [facility] Discharge Instructions  You were seen today by [name]. He went over your history, family history and how you've been feeling. He will schedule you for a biopsy. He will see you back after the biopsy for follow up.   Thank you for choosing [place] at [facility] to provide your oncology and hematology care.  To afford each patient quality time with our provider, please arrive at least 15 minutes before your scheduled appointment time.   If you have a lab appointment with the [facility] please come in thru the  Main Entrance and check in at the main information desk  You need to re-schedule your appointment should you arrive 10 or more minutes late.  We strive to give you quality time with our providers, and arriving late affects you and other patients whose appointments are after yours.  Also, if you no show three or more times for appointments you may be dismissed from the clinic at the providers discretion.     Again, thank you for choosing [facility].  Our hope is that these requests will decrease the amount of time that you wait before being seen by our physicians.       _____________________________________________________________  Should you have questions after your visit to [facility], please contact our office at [phone] between the hours of 8:00 a.m. and 4:30 p.m.  Voicemails left after 4:00 p.m. will not be returned until the following business day.  For prescription refill requests, have your pharmacy contact our office and allow 72 hours.    Cancer Center Support Programs:   > Cancer Support Group  2nd Tuesday of the month 1pm-2pm, Journey Room

## 2020-06-07 ENCOUNTER — Encounter (HOSPITAL_COMMUNITY): Payer: Self-pay

## 2020-06-07 NOTE — Progress Notes (Unsigned)
[name] Female, 64 y.o., 01/23/1956 MRN:  [id number] Phone:  [phone] [name]) PCP:  Ranae Plumber, [place] Coverage:  [facility] Medicare/[employer] Medicare Hmo Next Appt With Radiology ([facility]-CT 3) 06/13/2020 at 11:00 AM  RE: Biopsy Received: Today Message Details  Arne Cleveland, MD  [name]   CT core omental caking    [facility]   Previous Messages  ----- Message -----  From: [name]  Sent: 06/07/2020 12:11 PM EDT  To: [facility] Procedure Requests  Subject: Biopsy                      Procedure Requested: US Biopsy (abdominal retroperitoneal Mass)    Reason for Procedure: peritoneal carcinomatosis    Provider Requesting: Derek Jack   Provider Telephone: [phone]    Other Info: Rad exams in Epic

## 2020-06-07 NOTE — Progress Notes (Signed)
Late Entry:  I met with the patient and her significant other on 06/06/2020 during her initial visit with [name]. I explained my role in the patient's care and provided her with my contact information. All questions and concerns with addressed and answered to patient and significant other's satisfaction.

## 2020-06-12 ENCOUNTER — Ambulatory Visit: Payer: Medicare HMO | Admitting: Gastroenterology

## 2020-06-12 ENCOUNTER — Other Ambulatory Visit: Payer: Self-pay

## 2020-06-12 ENCOUNTER — Other Ambulatory Visit: Payer: Self-pay | Admitting: Radiology

## 2020-06-12 VITALS — BP 148/79 | HR 87 | Temp 98.1°F | Ht 60.0 in | Wt 123.0 lb

## 2020-06-12 DIAGNOSIS — G8929 Other chronic pain: Secondary | ICD-10-CM | POA: Diagnosis not present

## 2020-06-12 DIAGNOSIS — R1031 Right lower quadrant pain: Secondary | ICD-10-CM

## 2020-06-12 NOTE — Progress Notes (Signed)
Jonathon Bellows MD, MRCP(U.K) [address]  [place]  [place], [place] [address]  Main: [phone]  Fax: [phone]   Gastroenterology Consultation  Referring Provider:     Ranae Plumber, [place] Primary Care Physician:  Ranae Plumber, [place] Primary Gastroenterologist:  Dr. Jonathon Bellows  Reason for Consultation:    Abdominal pain        HPI:   [name] is a 64 y.o. y/o female referred for consultation & management  by  Ranae Plumber, [place].    She was seen in the emergency room on 05/29/2020 at [facility] for abdominal pain.  The pain has been ongoing for 1 month.  Never had an EGD or colonoscopy in the past.  On ferrous sulfate.  CT abdomen and pelvis on 05/29/2020 demonstrated extensive nodularity throughout the omentum and upper peritoneal cavity suspicious for peritoneal carcinomatosis.  No primary bowel or parenchymal malignancy is identified.  Chronic lung disease at both lung bases.  She has already been seen at the [place] center.  Elevated CA-125.  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  Awaiting GYN evaluation.   05/29/2020 hemoglobin 12.8 g.  1 year black hemoglobin was 9.4 g.  CMP normal. She says the abdominal pain began about a month back.  On and off.  Recently was worse and hence went to the emergency room.  No clear aggravating or relieving factors.  She does have a bowel movement every day.  No rectal bleeding.  No family history of colon cancer or polyps.  No change in weight recently.  She uses an oxygen concentrator all day and oxygen at home.  She can walk a maximum 15 steps before she gets short of breath. Past Medical History:  Diagnosis Date  . Anemia   . COPD (chronic obstructive pulmonary disease) ([place])   . High cholesterol   . Hypertension   . Neuropathy     Past Surgical History:  Procedure Laterality Date  . HALLUX VALGUS BASE WEDGE Right 06/09/2015   Procedure: Base  wedge osteotomy with modified McBride right foot ;  Surgeon: Sharlotte Alamo, MD;  Location: [facility] ORS;  Service: Podiatry;  Laterality: Right;    Prior to Admission medications   Medication Sig Start Date End Date Taking? Authorizing Provider  albuterol (PROVENTIL HFA;VENTOLIN HFA) 108 (90 BASE) MCG/ACT inhaler Inhale 4-6 puffs into the lungs every 6 (six) hours as needed for wheezing or shortness of breath.     [provider]  amLODipine (NORVASC) 5 MG tablet Take 5 mg by mouth at bedtime.     [provider]  aspirin EC 81 MG tablet Take 81 mg by mouth daily.    [provider]  dicyclomine (BENTYL) 10 MG capsule Take 10 mg by mouth in the morning, at noon, in the evening, and at bedtime. Patient not taking: Reported on 06/09/2020    [provider]  ferrous sulfate 325 (65 FE) MG tablet Take 325 mg by mouth 2 (two) times daily with a meal.     [provider]  Fluticasone-Umeclidin-Vilant (TRELEGY ELLIPTA) 100-62.5-25 MCG/INH AEPB Inhale into the lungs. 01/21/20   [provider]  gabapentin (NEURONTIN) 300 MG capsule Take 300 mg by mouth 3 (three) times daily.     [provider]  ipratropium (ATROVENT HFA) 17 MCG/ACT inhaler Inhale 2 puffs into the lungs every 6 (six) hours as needed for wheezing.     [provider]  ipratropium-albuterol (DUONEB) 0.5-2.5 (3) MG/3ML SOLN Take 0.5 mg by nebulization every 6 (six) hours as needed.  08/03/12   [provider]  nortriptyline (PAMELOR) 25 MG capsule Take 25 mg by mouth at bedtime.    [provider]  OXYGEN Inhale 3 L into the lungs continuous.     [provider]  pantoprazole (PROTONIX) 40 MG tablet Take 1 tablet (40 mg total) by mouth daily. 01/28/18   Gladstone Lighter, MD  predniSONE (DELTASONE) 5 MG tablet Take 5 mg by mouth daily with breakfast.    [provider]  predniSONE (STERAPRED UNI-PAK 21 TAB) 10 MG (21) TBPK tablet Start 60 mg po  daily, taper 10 mg daily until done Patient not taking: Reported on 06/09/2020 09/16/18   Max Sane, MD  simvastatin (ZOCOR) 40 MG tablet Take 40 mg by mouth at bedtime.    [provider]    Family History  Problem Relation Age of Onset  . Alzheimer's disease Mother   . COPD Father   . Emphysema Father   . Hypertension Father   . Healthy Sister   . Healthy Brother   . Alzheimer's disease Maternal Grandmother   . Colon cancer Paternal Grandmother   . Healthy Sister   . Healthy Sister   . Healthy Sister   . Breast cancer Neg Hx      Social History   Tobacco Use  . Smoking status: Former Smoker    Quit date: 11/04/2013    Years since quitting: 6.6  . Smokeless tobacco: Never Used  Vaping Use  . Vaping Use: Never used  Substance Use Topics  . Alcohol use: No  . Drug use: No    Allergies as of 06/12/2020  . (No Known Allergies)    Review of Systems:    All systems reviewed and negative except where noted in HPI.   Physical Exam:  There were no vitals taken for this visit. No LMP recorded. Patient is postmenopausal. Psych:  Alert and cooperative. Normal mood and affect. General:   Alert,  Well-developed, well-nourished, pleasant and cooperative in NAD Head:  Normocephalic and atraumatic. Eyes:  Sclera clear, no icterus.   Conjunctiva pink. Ears:  Normal auditory acuity. Neck:  Supple; no masses or thyromegaly. Lungs:  Respirations even and unlabored.  Clear throughout to auscultation.   No wheezes, crackles, or rhonchi. No acute distress. Heart:  Regular rate and rhythm; no murmurs, clicks, rubs, or gallops. Abdomen:  Normal bowel sounds.  No bruits.  Soft, non-tender and non-distended without masses, hepatosplenomegaly or hernias noted.  No guarding or rebound tenderness.    Neurologic:  Alert and oriented x3;  grossly normal neurologically. Psych:  Alert and cooperative. Normal mood and affect.  Imaging Studies: CT Abdomen Pelvis W Contrast  Result  Date: 05/29/2020 CLINICAL DATA:  Nonlocalized abdominal pain for 1.2 months. History of COPD and hypertension. No given history of malignancy. EXAM: CT ABDOMEN AND PELVIS WITH CONTRAST TECHNIQUE: Multidetector CT imaging of the abdomen and pelvis was performed using the standard protocol following bolus administration of intravenous contrast. CONTRAST:  161mL OMNIPAQUE IOHEXOL 300 MG/ML  SOLN COMPARISON:  Chest CT 10/08/2019. FINDINGS: Lower chest: Chronic lung disease at both lung bases appears similar to the previous studies. There is left greater than right bronchiectasis and peribronchovascular nodularity. Trace pleural fluid on the left. Small hiatal hernia with grossly stable small paraesophageal lymph nodes. Hepatobiliary: The liver is normal in density without suspicious focal abnormality. Probable small  subcapsular cyst peripherally in the right hepatic lobe, best seen on coronal image 50/5. The gallbladder is incompletely distended. No evidence of gallbladder wall thickening, gallstones or biliary dilatation. Pancreas: Unremarkable. No pancreatic ductal dilatation or surrounding inflammatory changes. Spleen: Normal in size without focal abnormality. Adrenals/Urinary Tract: Both adrenal glands appear normal. The kidneys appear normal without evidence of urinary tract calculus, suspicious lesion or hydronephrosis. No bladder abnormalities are seen. Stomach/Bowel: No evidence of bowel wall thickening, distention or surrounding inflammatory change. No primary bowel malignancy or obstruction identified. The appendix appears normal. There are descending and sigmoid colon diverticular changes and a moderate amount of stool throughout the colon. Vascular/Lymphatic: There are no enlarged abdominal or pelvic lymph nodes. There are few mildly prominent mesenteric lymph nodes, measuring up to 10 mm on image 39/2. Diffuse aortic and branch vessel atherosclerosis without acute vascular findings. The portal, superior  mesenteric and splenic veins are patent. Reproductive: Globular enlargement of the uterus with focal calcifications anteriorly, likely related to fibroids. Both ovaries are prominent without well-defined ovarian malignancy. Other: There is extensive nodularity throughout the omentum and upper peritoneal cavity highly suspicious for peritoneal carcinomatosis. Largest midline component measures 4.8 x 2.7 cm on image 30/2. There are components within the base of the mesentery and in the subhepatic space, medial to the gallbladder. A small amount of free pelvic ascites is present. No free intraperitoneal air. Small umbilical hernia containing only fat. Musculoskeletal: No acute or significant osseous findings. IMPRESSION: 1. Extensive nodularity throughout the omentum and upper peritoneal cavity highly suspicious for peritoneal carcinomatosis. 2. No primary bowel or parenchymal malignancy identified. Both ovaries are prominent, and ovarian malignancy is a possibility, although no well-defined adnexal mass is demonstrated. Tissue sampling of the omental disease and GYN evaluation suggested. 3. Chronic lung disease at both lung bases with left greater than right bronchiectasis and peribronchovascular nodularity, similar to previous chest CTs. 4. Aortic Atherosclerosis (ICD10-I70.0). 5. These results were called by telephone at the time of interpretation on 05/29/2020 at 1:47 pm to provider MARGAUX VENTER , who verbally acknowledged these results. Electronically Signed   By: Richardean Sale M.D.   On: 05/29/2020 13:47    Assessment and Plan:   ANNDEE CONNETT is a 64 y.o. y/o female has been referred for abdominal pain.  [facility] ER [facility] where a CT scan of the abdomen was performed demonstrated features concerning for peritoneal carcinomatosis.  She is already been seen by her oncologist.  Awaiting CT scan of the chest.  CA-125 is significantly elevated.  Never had an EGD or colonoscopy.  Presently she does  not have significant abdominal pain.  It appears that she probably has an underlying ovarian cancer.  At this point of time I would be hesitant to perform an EGD and colonoscopy unless it changes the management from the cancer point of view.  The reason being she is on 24 hours of oxygen has poor exercise tolerance and she would be at very high risk for anesthesia.  The benefits of the procedure may exceed the risks.  Obviously if this would impact her cancer treatment then we would need to reevaluate this.  I would have to discuss this with the anesthesia team at our hospital if they are happy to have it performed here or at a tertiary center as she would be very high risk.  I have informed this to the patient.  I will send a copy of the note to [name].  It is  very likely that her cancer is contributing to her abdominal discomfort.    Follow up in as needed  Dr Jonathon Bellows MD,MRCP(U.K)

## 2020-06-13 ENCOUNTER — Other Ambulatory Visit: Payer: Self-pay

## 2020-06-13 ENCOUNTER — Ambulatory Visit (HOSPITAL_COMMUNITY)
Admission: RE | Admit: 2020-06-13 | Discharge: 2020-06-13 | Disposition: A | Discharge status: Home / Self Care | Payer: Medicare HMO | Source: Ambulatory Visit | Attending: Hematology | Admitting: Hematology

## 2020-06-13 ENCOUNTER — Encounter (HOSPITAL_COMMUNITY): Payer: Self-pay

## 2020-06-13 DIAGNOSIS — Z7982 Long term (current) use of aspirin: Secondary | ICD-10-CM | POA: Diagnosis not present

## 2020-06-13 DIAGNOSIS — Z7952 Long term (current) use of systemic steroids: Secondary | ICD-10-CM | POA: Diagnosis not present

## 2020-06-13 DIAGNOSIS — C801 Malignant (primary) neoplasm, unspecified: Secondary | ICD-10-CM | POA: Insufficient documentation

## 2020-06-13 DIAGNOSIS — C786 Secondary malignant neoplasm of retroperitoneum and peritoneum: Secondary | ICD-10-CM

## 2020-06-13 DIAGNOSIS — I700 Atherosclerosis of aorta: Secondary | ICD-10-CM | POA: Diagnosis not present

## 2020-06-13 DIAGNOSIS — Z87891 Personal history of nicotine dependence: Secondary | ICD-10-CM | POA: Diagnosis not present

## 2020-06-13 DIAGNOSIS — I10 Essential (primary) hypertension: Secondary | ICD-10-CM | POA: Insufficient documentation

## 2020-06-13 DIAGNOSIS — J449 Chronic obstructive pulmonary disease, unspecified: Secondary | ICD-10-CM | POA: Diagnosis not present

## 2020-06-13 DIAGNOSIS — Z79899 Other long term (current) drug therapy: Secondary | ICD-10-CM | POA: Diagnosis not present

## 2020-06-13 DIAGNOSIS — E7800 Pure hypercholesterolemia, unspecified: Secondary | ICD-10-CM | POA: Insufficient documentation

## 2020-06-13 DIAGNOSIS — Z7951 Long term (current) use of inhaled steroids: Secondary | ICD-10-CM | POA: Diagnosis not present

## 2020-06-13 LAB — PROTIME-INR
INR: 0.9 (ref 0.8–1.2)
Prothrombin Time: 11.5 seconds (ref 11.4–15.2)

## 2020-06-13 LAB — CBC
HCT: 43.8 % (ref 36.0–46.0)
Hemoglobin: 13.5 g/dL (ref 12.0–15.0)
MCH: 27.7 pg (ref 26.0–34.0)
MCHC: 30.8 g/dL (ref 30.0–36.0)
MCV: 89.8 fL (ref 80.0–100.0)
Platelets: 381 10*3/uL (ref 150–400)
RBC: 4.88 MIL/uL (ref 3.87–5.11)
RDW: 13.2 % (ref 11.5–15.5)
WBC: 6.6 10*3/uL (ref 4.0–10.5)
nRBC: 0 % (ref 0.0–0.2)

## 2020-06-13 MED ORDER — MIDAZOLAM HCL 2 MG/2ML IJ SOLN
INTRAMUSCULAR | Status: AC | PRN
  Administered 2020-06-13: 1 mg via INTRAVENOUS

## 2020-06-13 MED ORDER — FENTANYL CITRATE (PF) 100 MCG/2ML IJ SOLN
INTRAMUSCULAR | Status: AC | PRN
  Administered 2020-06-13: 25 ug via INTRAVENOUS

## 2020-06-13 MED ORDER — SODIUM CHLORIDE 0.9 % IV SOLN: INTRAVENOUS | Status: DC

## 2020-06-13 MED ORDER — FENTANYL CITRATE (PF) 100 MCG/2ML IJ SOLN
INTRAMUSCULAR | Status: AC
  Filled 2020-06-13: qty 2

## 2020-06-13 MED ORDER — MIDAZOLAM HCL 2 MG/2ML IJ SOLN
INTRAMUSCULAR | Status: AC
  Filled 2020-06-13: qty 2

## 2020-06-13 MED ORDER — HYDROCODONE-ACETAMINOPHEN 5-325 MG PO TABS: 1.0000 | ORAL_TABLET | ORAL | Status: DC | PRN

## 2020-06-13 NOTE — Discharge Instructions (Signed)
Needle Biopsy, Care After These instructions tell you how to care for yourself after your procedure. Your doctor may also give you more specific instructions. Call your doctor if you have any problems or questions. What can I expect after the procedure? After the procedure, it is common to have:  Soreness.  Bruising.  Mild pain. Follow these instructions at home:   Return to your normal activities as told by your doctor. Ask your doctor what activities are safe for you.  Take over-the-counter and prescription medicines only as told by your doctor.  Wash your hands with soap and water before you change your bandage (dressing). If you cannot use soap and water, use hand sanitizer.  Follow instructions from your doctor about: ? How to take care of your puncture site. ? When and how to change your bandage. ? When to remove your bandage. In 24 hours. Then you may shower. No baths until site is healed.  Check your puncture site every day for signs of infection. Watch for: ? Redness, swelling, or pain. ? Fluid or blood. ? Pus or a bad smell. ? Warmth.  Do not take baths, swim, or use a hot tub until your doctor approves. Ask your doctor if you may take showers. You may only be allowed to take sponge baths.  Keep all follow-up visits as told by your doctor. This is important. Contact a doctor if you have:  A fever.  Redness, swelling, or pain at the puncture site, and it lasts longer than a few days.  Fluid, blood, or pus coming from the puncture site.  Warmth coming from the puncture site. Get help right away if:  You have a lot of bleeding from the puncture site. Summary  After the procedure, it is common to have soreness, bruising, or mild pain at the puncture site.  Check your puncture site every day for signs of infection, such as redness, swelling, or pain.  Get help right away if you have severe bleeding from your puncture site. This information is not intended to  replace advice given to you by your health care provider. Make sure you discuss any questions you have with your health care provider. Document Revised: 11/17/2017 Document Reviewed: 11/17/2017 Elsevier Patient Education  2020 [employer].

## 2020-06-13 NOTE — Procedures (Signed)
  Procedure: CT core bx omental disease   EBL:   minimal Complications:  none immediate  See full dictation in [facility].  Dillard Cannon MD Main # [phone] Pager  [phone]

## 2020-06-13 NOTE — H&P (Addendum)
Chief Complaint: Patient was seen in consultation today for omental caking biopsy at the request of Katragadda,Sreedhar  Referring Physician(s): Katragadda,Sreedhar  Supervising Physician: Arne Cleveland  Patient Status: [facility] - Out-pt  History of Present Illness: [name] is a 64 y.o. female   Presented to [facility] ED 7/12 with horrible abd pain x few weeks Imaging revealed: IMPRESSION: 1. Extensive nodularity throughout the omentum and upper peritoneal cavity highly suspicious for peritoneal carcinomatosis. 2. No primary bowel or parenchymal malignancy identified. Both ovaries are prominent, and ovarian malignancy is a possibility, although no well-defined adnexal mass is demonstrated. Tissue sampling of the omental disease and GYN evaluation suggested. 3. Chronic lung disease at both lung bases with left greater than right bronchiectasis and peribronchovascular nodularity, similar to previous chest CTs.  Referred to Dr Delton Coombes 7/20 note:  ASSESSMENT: 1.  Peritoneal carcinomatosis: -Presentation to the ER with right lower quadrant abdominal pain on and off for 1 month. -CTAP on 05/29/2020 showed extensive nodularity throughout the omentum and upper peritoneal cavity.  Both ovaries are prominent although no well-defined mass is noted. -CA-125 is elevated at 2407.  CEA was 2.4. 2.  COPD: -Quit smoking 6 years ago.  Smoked 2 packs/day for 25-30 years. -Has been on oxygen for the last 5 years. 3.  Family history: -Paternal grandmother with colon cancer. PLAN: 1.  Peritoneal carcinomatosis: -I have reviewed results of the CT and images with the patient and her boyfriend. -Given the elevated CA-125, either primary peritoneal carcinomatosis or ovarian malignancy is highly likely. -I have recommended ultrasound-guided/CT guided biopsy of the peritoneal mass.  Scheduled now for biopsy   Past Medical History:  Diagnosis Date  . Anemia   . COPD (chronic obstructive  pulmonary disease) ([place])   . High cholesterol   . Hypertension   . Neuropathy     Past Surgical History:  Procedure Laterality Date  . HALLUX VALGUS BASE WEDGE Right 06/09/2015   Procedure: Base wedge osteotomy with modified McBride right foot ;  Surgeon: Sharlotte Alamo, MD;  Location: [facility] ORS;  Service: Podiatry;  Laterality: Right;    Allergies: Patient has no known allergies.  Medications: Prior to Admission medications   Medication Sig Start Date End Date Taking? Authorizing Provider  albuterol (PROVENTIL HFA;VENTOLIN HFA) 108 (90 BASE) MCG/ACT inhaler Inhale 4-6 puffs into the lungs every 6 (six) hours as needed for wheezing or shortness of breath.    Yes [provider]  amLODipine (NORVASC) 5 MG tablet Take 5 mg by mouth at bedtime.    Yes [provider]  aspirin EC 81 MG tablet Take 81 mg by mouth daily.   Yes [provider]  ferrous sulfate 325 (65 FE) MG tablet Take 325 mg by mouth 2 (two) times daily with a meal.    Yes [provider]  Fluticasone-Umeclidin-Vilant (TRELEGY ELLIPTA) 100-62.5-25 MCG/INH AEPB Inhale into the lungs. 01/21/20  Yes [provider]  gabapentin (NEURONTIN) 300 MG capsule Take 300 mg by mouth 3 (three) times daily.    Yes [provider]  ipratropium (ATROVENT HFA) 17 MCG/ACT inhaler Inhale 2 puffs into the lungs every 6 (six) hours as needed for wheezing.    Yes [provider]  ipratropium-albuterol (DUONEB) 0.5-2.5 (3) MG/3ML SOLN Take 0.5 mg by nebulization every 6 (six) hours as needed.  08/03/12  Yes [provider]  nortriptyline (PAMELOR) 25 MG capsule Take 25 mg by mouth at bedtime.   Yes [provider]  OXYGEN Inhale 3  L into the lungs continuous.    Yes [provider]  pantoprazole (PROTONIX) 40 MG tablet Take 1 tablet (40 mg total) by mouth daily. 01/28/18  Yes Gladstone Lighter, MD  predniSONE (DELTASONE) 5 MG tablet Take 5 mg by mouth daily with  breakfast.   Yes [provider]  simvastatin (ZOCOR) 40 MG tablet Take 40 mg by mouth at bedtime.   Yes [provider]  dicyclomine (BENTYL) 10 MG capsule Take 10 mg by mouth in the morning, at noon, in the evening, and at bedtime. Patient not taking: Reported on 06/09/2020    [provider]  predniSONE (STERAPRED UNI-PAK 21 TAB) 10 MG (21) TBPK tablet Start 60 mg po daily, taper 10 mg daily until done Patient not taking: Reported on 06/09/2020 09/16/18   Max Sane, MD     Family History  Problem Relation Age of Onset  . Alzheimer's disease Mother   . COPD Father   . Emphysema Father   . Hypertension Father   . Healthy Sister   . Healthy Brother   . Alzheimer's disease Maternal Grandmother   . Colon cancer Paternal Grandmother   . Healthy Sister   . Healthy Sister   . Healthy Sister   . Breast cancer Neg Hx     Social History   Socioeconomic History  . Marital status: Divorced    Spouse name: Not on file  . Number of children: 3  . Years of education: Not on file  . Highest education level: Not on file  Occupational History  . Occupation: DISABLED  Tobacco Use  . Smoking status: Former Smoker    Quit date: 11/04/2013    Years since quitting: 6.6  . Smokeless tobacco: Never Used  Vaping Use  . Vaping Use: Never used  Substance and Sexual Activity  . Alcohol use: No  . Drug use: No  . Sexual activity: Not Currently  Other Topics Concern  . Not on file  Social History Narrative  . Not on file   Social Determinants of Health   Financial Resource Strain: Medium Risk  . Difficulty of Paying Living Expenses: Somewhat hard  Food Insecurity: No Food Insecurity  . Worried About Charity fundraiser in the Last Year: Never true  . Ran Out of Food in the Last Year: Never true  Transportation Needs: No Transportation Needs  . Lack of Transportation (Medical): No  . Lack of Transportation (Non-Medical): No  Physical Activity: Inactive  .  Days of Exercise per Week: 0 days  . Minutes of Exercise per Session: 0 min  Stress: Stress Concern Present  . Feeling of Stress : To some extent  Social Connections: Moderately Isolated  . Frequency of Communication with Friends and Family: More than three times a week  . Frequency of Social Gatherings with Friends and Family: Never  . Attends Religious Services: More than 4 times per year  . Active Member of Clubs or Organizations: No  . Attends Archivist Meetings: Never  . Marital Status: Divorced    Review of Systems: A 12 point ROS discussed and pertinent positives are indicated in the HPI above.  All other systems are negative.  Review of Systems  Constitutional: Positive for activity change and appetite change. Negative for fever.  Respiratory: Negative for cough and shortness of breath.   Cardiovascular: Negative for chest pain.  Gastrointestinal: Positive for abdominal pain.  Psychiatric/Behavioral: Negative for behavioral problems and confusion.    Vital Signs: BP Marland Kitchen)  144/77   Pulse 95   Temp (!) 97.5 F (36.4 C) (Oral)   Resp 14   Ht 5' (1.524 m)   Wt 123 lb (55.8 kg)   SpO2 100%   BMI 24.02 kg/m   Physical Exam Vitals reviewed.  Cardiovascular:     Rate and Rhythm: Normal rate and regular rhythm.     Heart sounds: Normal heart sounds.  Pulmonary:     Effort: Pulmonary effort is normal.     Breath sounds: Normal breath sounds.  Abdominal:     Palpations: Abdomen is soft.     Tenderness: There is no abdominal tenderness.  Musculoskeletal:        General: Normal range of motion.  Skin:    General: Skin is warm.  Neurological:     Mental Status: She is alert and oriented to person, place, and time.  Psychiatric:        Behavior: Behavior normal.     Imaging: CT Abdomen Pelvis W Contrast  Result Date: 05/29/2020 CLINICAL DATA:  Nonlocalized abdominal pain for 1.2 months. History of COPD and hypertension. No given history of malignancy.  EXAM: CT ABDOMEN AND PELVIS WITH CONTRAST TECHNIQUE: Multidetector CT imaging of the abdomen and pelvis was performed using the standard protocol following bolus administration of intravenous contrast. CONTRAST:  124mL OMNIPAQUE IOHEXOL 300 MG/ML  SOLN COMPARISON:  Chest CT 10/08/2019. FINDINGS: Lower chest: Chronic lung disease at both lung bases appears similar to the previous studies. There is left greater than right bronchiectasis and peribronchovascular nodularity. Trace pleural fluid on the left. Small hiatal hernia with grossly stable small paraesophageal lymph nodes. Hepatobiliary: The liver is normal in density without suspicious focal abnormality. Probable small subcapsular cyst peripherally in the right hepatic lobe, best seen on coronal image 50/5. The gallbladder is incompletely distended. No evidence of gallbladder wall thickening, gallstones or biliary dilatation. Pancreas: Unremarkable. No pancreatic ductal dilatation or surrounding inflammatory changes. Spleen: Normal in size without focal abnormality. Adrenals/Urinary Tract: Both adrenal glands appear normal. The kidneys appear normal without evidence of urinary tract calculus, suspicious lesion or hydronephrosis. No bladder abnormalities are seen. Stomach/Bowel: No evidence of bowel wall thickening, distention or surrounding inflammatory change. No primary bowel malignancy or obstruction identified. The appendix appears normal. There are descending and sigmoid colon diverticular changes and a moderate amount of stool throughout the colon. Vascular/Lymphatic: There are no enlarged abdominal or pelvic lymph nodes. There are few mildly prominent mesenteric lymph nodes, measuring up to 10 mm on image 39/2. Diffuse aortic and branch vessel atherosclerosis without acute vascular findings. The portal, superior mesenteric and splenic veins are patent. Reproductive: Globular enlargement of the uterus with focal calcifications anteriorly, likely related to  fibroids. Both ovaries are prominent without well-defined ovarian malignancy. Other: There is extensive nodularity throughout the omentum and upper peritoneal cavity highly suspicious for peritoneal carcinomatosis. Largest midline component measures 4.8 x 2.7 cm on image 30/2. There are components within the base of the mesentery and in the subhepatic space, medial to the gallbladder. A small amount of free pelvic ascites is present. No free intraperitoneal air. Small umbilical hernia containing only fat. Musculoskeletal: No acute or significant osseous findings. IMPRESSION: 1. Extensive nodularity throughout the omentum and upper peritoneal cavity highly suspicious for peritoneal carcinomatosis. 2. No primary bowel or parenchymal malignancy identified. Both ovaries are prominent, and ovarian malignancy is a possibility, although no well-defined adnexal mass is demonstrated. Tissue sampling of the omental disease and GYN evaluation suggested. 3. Chronic lung disease  at both lung bases with left greater than right bronchiectasis and peribronchovascular nodularity, similar to previous chest CTs. 4. Aortic Atherosclerosis (ICD10-I70.0). 5. These results were called by telephone at the time of interpretation on 05/29/2020 at 1:47 pm to provider MARGAUX VENTER , who verbally acknowledged these results. Electronically Signed   By: Richardean Sale M.D.   On: 05/29/2020 13:47    Labs:  CBC: Recent Labs    05/29/20 1059  WBC 10.5  HGB 12.8  HCT 41.3  PLT 400    COAGS: No results for input(s): INR, APTT in the last 8760 hours.  BMP: Recent Labs    05/29/20 1059  NA 141  K 4.1  CL 99  CO2 32  GLUCOSE 101*  BUN 8  CALCIUM 9.4  CREATININE 0.47  GFRNONAA >60  GFRAA >60    LIVER FUNCTION TESTS: Recent Labs    05/29/20 1059  BILITOT 0.3  AST 21  ALT 12  ALKPHOS 85  PROT 6.6  ALBUMIN 3.8    TUMOR MARKERS: No results for input(s): AFPTM, CEA, CA199, CHROMGRNA in the last 8760  hours.  Assessment and Plan:  Abd pain; tumor markers high Omental caking on CT Plan now for omental biopsy per Dr Delton Coombes  Risks and benefits of omental caking biopsy was discussed with the patient and/or patient's family including, but not limited to bleeding, infection, damage to adjacent structures or low yield requiring additional tests.  All of the questions were answered and there is agreement to proceed. Consent signed and in chart.   Thank you for this interesting consult.  I greatly enjoyed meeting [name] and look forward to participating in their care.  A copy of this report was sent to the requesting provider on this date.  Electronically Signed: Lavonia Drafts, PA-C 06/13/2020, 9:54 AM   I spent a total of  30 Minutes   in face to face in clinical consultation, greater than 50% of which was counseling/coordinating care for omental caking bx

## 2020-06-15 ENCOUNTER — Ambulatory Visit: Payer: Medicare HMO | Admitting: General Surgery

## 2020-06-15 LAB — SURGICAL PATHOLOGY

## 2020-06-22 ENCOUNTER — Ambulatory Visit: Payer: Medicare HMO | Admitting: General Surgery

## 2020-06-22 ENCOUNTER — Other Ambulatory Visit: Payer: Self-pay

## 2020-06-22 ENCOUNTER — Encounter: Payer: Self-pay | Admitting: General Surgery

## 2020-06-22 VITALS — BP 124/75 | HR 91 | Temp 96.7°F | Resp 18 | Ht 60.0 in | Wt 122.0 lb

## 2020-06-22 DIAGNOSIS — C786 Secondary malignant neoplasm of retroperitoneum and peritoneum: Secondary | ICD-10-CM

## 2020-06-22 DIAGNOSIS — C801 Malignant (primary) neoplasm, unspecified: Secondary | ICD-10-CM

## 2020-06-22 NOTE — Patient Instructions (Signed)

## 2020-06-22 NOTE — Progress Notes (Signed)
[facility] History and Physical  Reason for Referral: Port a catheter placement Referring Physician:  [name]  Chief Complaint    New Patient (Initial Visit)      [name] is a 64 y.o. female.  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 She has a history of COPD on 3L [place] and says this has been relatively stable in the last 5 years increasing from 2 about 1 year ago. She presented to the ED with abdominal pain and had a CT that demonstrated omental caking and concern for carcinomatosis, and biopsy of the omentum done.  The ovaries were prominent but no obvious mass. [name] favors primary peritoneal carcinomatosis or ovarian.   Past Medical History:  Diagnosis Date  . Anemia   . COPD (chronic obstructive pulmonary disease) ([place])   . High cholesterol   . Hypertension   . Neuropathy     Past Surgical History:  Procedure Laterality Date  . HALLUX VALGUS BASE WEDGE Right 06/09/2015   Procedure: Base wedge osteotomy with modified McBride right foot ;  Surgeon: Sharlotte Alamo, MD;  Location: [facility] ORS;  Service: Podiatry;  Laterality: Right;    Family History  Problem Relation Age of Onset  . Alzheimer's disease Mother   . COPD Father   . Emphysema Father   . Hypertension Father   . Healthy Sister   . Healthy Brother   . Alzheimer's disease Maternal Grandmother   . Colon cancer Paternal Grandmother   . Healthy Sister   . Healthy Sister   . Healthy Sister   . Breast cancer Neg Hx     Social History   Tobacco Use  . Smoking status: Former Smoker    Quit date: 11/04/2013    Years since quitting: 6.6  . Smokeless tobacco: Never Used  Vaping Use  . Vaping Use: Never used  Substance Use Topics  . Alcohol use: No  . Drug use: No    Medications: I have reviewed the patient's current medications. Allergies as of 06/22/2020   No Known Allergies       Medication List       Accurate as of June 22, 2020  9:36 AM. If you have any questions, ask your nurse or doctor.        albuterol 108 (90 Base) MCG/ACT inhaler Commonly known as: VENTOLIN HFA Inhale 4-6 puffs into the lungs every 6 (six) hours as needed for wheezing or shortness of breath.   amLODipine 5 MG tablet Commonly known as: NORVASC Take 5 mg by mouth at bedtime.   aspirin EC 81 MG tablet Take 81 mg by mouth daily.   dicyclomine 10 MG capsule Commonly known as: BENTYL Take 10 mg by mouth in the morning, at noon, in the evening, and at bedtime.   ferrous sulfate 325 (65 FE) MG tablet Take 325 mg by mouth 2 (two) times daily with a meal.   gabapentin 300 MG capsule Commonly known as: NEURONTIN Take 300 mg by mouth 3 (three) times daily.   ipratropium 17 MCG/ACT inhaler Commonly known as: ATROVENT HFA Inhale 2 puffs into the lungs every 6 (six) hours as needed for wheezing.   ipratropium-albuterol 0.5-2.5 (3) MG/3ML Soln Commonly known as: DUONEB Take 0.5 mg by nebulization every 6 (six) hours as needed.   nortriptyline 25 MG capsule Commonly known as: PAMELOR Take 25 mg by mouth at bedtime.   OXYGEN  Inhale 3 L into the lungs continuous.   pantoprazole 40 MG tablet Commonly known as: PROTONIX Take 1 tablet (40 mg total) by mouth daily.   predniSONE 5 MG tablet Commonly known as: DELTASONE Take 5 mg by mouth daily with breakfast.   simvastatin 40 MG tablet Commonly known as: ZOCOR Take 40 mg by mouth at bedtime.   Trelegy Ellipta 100-62.5-25 MCG/INH Aepb Generic drug: Fluticasone-Umeclidin-Vilant Inhale into the lungs.        ROS:  A comprehensive review of systems was negative except for: Respiratory: positive for SOB Gastrointestinal: positive for abdominal pain and reflux symptoms  Blood pressure 124/75, pulse 91, temperature (!) 96.7 F (35.9 C), temperature source Other (Comment), resp. rate 18, height 5' (1.524 m), weight 122 lb (55.3  kg), SpO2 91 %. Physical Exam Vitals reviewed.  HENT:     Head: Normocephalic and atraumatic.     Nose: Nose normal.     Mouth/Throat:     Mouth: Mucous membranes are moist.  Eyes:     Pupils: Pupils are equal, round, and reactive to light.  Cardiovascular:     Rate and Rhythm: Normal rate.  Pulmonary:     Effort: Pulmonary effort is normal. No respiratory distress.     Breath sounds: Wheezing present.  Abdominal:     General: There is no distension.     Palpations: Abdomen is soft.     Tenderness: There is generalized abdominal tenderness.  Musculoskeletal:        General: No swelling. Normal range of motion.     Cervical back: Normal range of motion.  Skin:    General: Skin is warm and dry.  Neurological:     General: No focal deficit present.     Mental Status: She is alert and oriented to person, place, and time.  Psychiatric:        Mood and Affect: Mood normal.        Behavior: Behavior normal.        Thought Content: Thought content normal.        Judgment: Judgment normal.     Results: Personally reviewed CT a/p and omental caking and nodularity noted  Assessment & Plan:  [name] is a 64 y.o. female with peritoneal carcinomatosis either primary or ovarian origin. Needing treatment and port a catheter placement.   -Discussed risk of port including bleed, infection, malfunction, vessel injury, pneumothorax and discussed increased risk with her prednisone.  -Discussed preop COVID testing    All questions were answered to the satisfaction of the patient and family.    Virl Cagey 06/22/2020, 9:36 AM

## 2020-06-23 NOTE — H&P (Signed)
[facility] History and Physical  Reason for Referral: Port a catheter placement Referring Physician:  [name]  Chief Complaint    New Patient (Initial Visit)      [name] is a 64 y.o. female.  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 She has a history of COPD on 3L [place] and says this has been relatively stable in the last 5 years increasing from 2 about 1 year ago. She presented to the ED with abdominal pain and had a CT that demonstrated omental caking and concern for carcinomatosis, and biopsy of the omentum done.  The ovaries were prominent but no obvious mass. [name] favors primary peritoneal carcinomatosis or ovarian.   Past Medical History:  Diagnosis Date   Anemia    COPD (chronic obstructive pulmonary disease) ([facility])    High cholesterol    Hypertension    Neuropathy     Past Surgical History:  Procedure Laterality Date   HALLUX VALGUS BASE WEDGE Right 06/09/2015   Procedure: Base wedge osteotomy with modified McBride right foot ;  Surgeon: Sharlotte Alamo, MD;  Location: [facility] ORS;  Service: Podiatry;  Laterality: Right;    Family History  Problem Relation Age of Onset   Alzheimer's disease Mother    COPD Father    Emphysema Father    Hypertension Father    Healthy Sister    Healthy Brother    Alzheimer's disease Maternal Grandmother    Colon cancer Paternal Grandmother    Healthy Sister    Healthy Sister    Healthy Sister    Breast cancer Neg Hx     Social History   Tobacco Use   Smoking status: Former Smoker    Quit date: 11/04/2013    Years since quitting: 6.6   Smokeless tobacco: Never Used  Vaping Use   Vaping Use: Never used  Substance Use Topics   Alcohol use: No   Drug use: No    Medications: I have reviewed the patient's current medications. Allergies as of 06/22/2020   No Known Allergies       Medication List       Accurate as of June 22, 2020  9:36 AM. If you have any questions, ask your nurse or doctor.        albuterol 108 (90 Base) MCG/ACT inhaler Commonly known as: VENTOLIN HFA Inhale 4-6 puffs into the lungs every 6 (six) hours as needed for wheezing or shortness of breath.   amLODipine 5 MG tablet Commonly known as: NORVASC Take 5 mg by mouth at bedtime.   aspirin EC 81 MG tablet Take 81 mg by mouth daily.   dicyclomine 10 MG capsule Commonly known as: BENTYL Take 10 mg by mouth in the morning, at noon, in the evening, and at bedtime.   ferrous sulfate 325 (65 FE) MG tablet Take 325 mg by mouth 2 (two) times daily with a meal.   gabapentin 300 MG capsule Commonly known as: NEURONTIN Take 300 mg by mouth 3 (three) times daily.   ipratropium 17 MCG/ACT inhaler Commonly known as: ATROVENT HFA Inhale 2 puffs into the lungs every 6 (six) hours as needed for wheezing.   ipratropium-albuterol 0.5-2.5 (3) MG/3ML Soln Commonly known as: DUONEB Take 0.5 mg by nebulization every 6 (six) hours as needed.   nortriptyline 25 MG capsule Commonly known as: PAMELOR Take 25 mg by mouth at bedtime.   OXYGEN  Inhale 3 L into the lungs continuous.   pantoprazole 40 MG tablet Commonly known as: PROTONIX Take 1 tablet (40 mg total) by mouth daily.   predniSONE 5 MG tablet Commonly known as: DELTASONE Take 5 mg by mouth daily with breakfast.   simvastatin 40 MG tablet Commonly known as: ZOCOR Take 40 mg by mouth at bedtime.   Trelegy Ellipta 100-62.5-25 MCG/INH Aepb Generic drug: Fluticasone-Umeclidin-Vilant Inhale into the lungs.        ROS:  A comprehensive review of systems was negative except for: Respiratory: positive for SOB Gastrointestinal: positive for abdominal pain and reflux symptoms  Blood pressure 124/75, pulse 91, temperature (!) 96.7 F (35.9 C), temperature source Other (Comment), resp. rate 18, height 5' (1.524 m), weight 122 lb (55.3  kg), SpO2 91 %. Physical Exam Vitals reviewed.  HENT:     Head: Normocephalic and atraumatic.     Nose: Nose normal.     Mouth/Throat:     Mouth: Mucous membranes are moist.  Eyes:     Pupils: Pupils are equal, round, and reactive to light.  Cardiovascular:     Rate and Rhythm: Normal rate.  Pulmonary:     Effort: Pulmonary effort is normal. No respiratory distress.     Breath sounds: Wheezing present.  Abdominal:     General: There is no distension.     Palpations: Abdomen is soft.     Tenderness: There is generalized abdominal tenderness.  Musculoskeletal:        General: No swelling. Normal range of motion.     Cervical back: Normal range of motion.  Skin:    General: Skin is warm and dry.  Neurological:     General: No focal deficit present.     Mental Status: She is alert and oriented to person, place, and time.  Psychiatric:        Mood and Affect: Mood normal.        Behavior: Behavior normal.        Thought Content: Thought content normal.        Judgment: Judgment normal.     Results: Personally reviewed CT a/p and omental caking and nodularity noted  Assessment & Plan:  [name] is a 64 y.o. female with peritoneal carcinomatosis either primary or ovarian origin. Needing treatment and port a catheter placement.   -Discussed risk of port including bleed, infection, malfunction, vessel injury, pneumothorax and discussed increased risk with her prednisone.  -Discussed preop COVID testing    All questions were answered to the satisfaction of the patient and family.    Virl Cagey 06/22/2020, 9:36 AM

## 2020-06-26 ENCOUNTER — Other Ambulatory Visit: Payer: Self-pay

## 2020-06-26 ENCOUNTER — Encounter (HOSPITAL_COMMUNITY): Payer: Self-pay

## 2020-06-26 ENCOUNTER — Other Ambulatory Visit (HOSPITAL_COMMUNITY)
Admission: RE | Admit: 2020-06-26 | Discharge: 2020-06-26 | Disposition: A | Discharge status: Home / Self Care | Payer: Medicare HMO | Source: Ambulatory Visit | Attending: General Surgery | Admitting: General Surgery

## 2020-06-26 ENCOUNTER — Encounter (HOSPITAL_COMMUNITY)
Admission: RE | Admit: 2020-06-26 | Discharge: 2020-06-26 | Disposition: A | Discharge status: Home / Self Care | Payer: Medicare HMO | Source: Ambulatory Visit | Attending: General Surgery | Admitting: General Surgery

## 2020-06-26 DIAGNOSIS — Z20822 Contact with and (suspected) exposure to covid-19: Secondary | ICD-10-CM | POA: Insufficient documentation

## 2020-06-26 DIAGNOSIS — Z01812 Encounter for preprocedural laboratory examination: Secondary | ICD-10-CM | POA: Insufficient documentation

## 2020-06-26 HISTORY — DX: Unspecified asthma, uncomplicated: J45.909

## 2020-06-26 HISTORY — DX: Malignant (primary) neoplasm, unspecified: C80.1

## 2020-06-26 NOTE — Patient Instructions (Signed)
[name]  06/26/2020     @PREFPERIOPPHARMACY @   Your procedure is scheduled on 06/28/2020.  Report to [facility] at 7:00 A.M.  Call this number if you have problems the morning of surgery:  [phone]   Remember:  Do not eat or drink after midnight.   Take these medicines the morning of surgery with A SIP OF WATER : Amlodipine, Gabapentin, Protonix and Prednisone.  Please use your inhalers before coming to the hospital.    Do not wear jewelry, make-up or nail polish.  Do not wear lotions, powders, or perfumes, or deodorant.  Do not shave 48 hours prior to surgery.  Men may shave face and neck.  Do not bring valuables to the hospital.  [facility] is not responsible for any belongings or valuables.  Contacts, dentures or bridgework may not be worn into surgery.  Leave your suitcase in the car.  After surgery it may be brought to your room.  For patients admitted to the hospital, discharge time will be determined by your treatment team.  Patients discharged the day of surgery will not be allowed to drive home.   Name and phone number of your driver:   family Special instructions:  Please use the Chlorhexidine Wash the night before surgery and the morning of surgery.    Please read over the following fact sheets that you were given. Care and Recovery After Surgery    Implanted Port Insertion Implanted port insertion is a procedure to put in a port and catheter. The port is a device with an injectable disk that can be accessed by your health care provider. The port is connected to a vein in the chest or neck by a small flexible tube (catheter). There are different types of ports. The implanted port may be used as a long-term IV access for:  Medicines, such as chemotherapy.  Fluids.  Liquid nutrition, such as total parenteral nutrition (TPN). When you have a port, this means that your health care provider will not need to use the veins in your arms for these  procedures. Tell a health care provider about:  Any allergies you have.  All medicines you are taking, especially blood thinners, as well as any vitamins, herbs, eye drops, creams, over-the-counter medicines, and steroids.  Any problems you or family members have had with anesthetic medicines.  Any blood disorders you have.  Any surgeries you have had.  Any medical conditions you have or have had, including diabetes or kidney problems.  Whether you are pregnant or may be pregnant. What are the risks? Generally, this is a safe procedure. However, problems may occur, including:  Allergic reactions to medicines or dyes.  Damage to other structures or organs.  Infection.  Damage to the blood vessel, bruising, or bleeding at the puncture site.  Blood clot.  Breakdown of the skin over the port.  A collection of air in the chest that can cause one of the lungs to collapse (pneumothorax). This is rare. What happens before the procedure? Medicines  Ask your health care provider about: ? Changing or stopping your regular medicines. This is especially important if you are taking diabetes medicines or blood thinners. ? Taking medicines such as aspirin and ibuprofen. These medicines can thin your blood. Do not take these medicines unless your health care provider tells you to take them. ? Taking over-the-counter medicines, vitamins, herbs, and supplements. Staying hydrated Follow instructions from your health care provider about hydration, which may include:  Up to 2 hours before the procedure - you may continue to drink clear liquids, such as water, clear fruit juice, black coffee, and plain tea.  Eating and drinking restrictions  Follow instructions from your health care provider about eating and drinking, which may include: ? 8 hours before the procedure - stop eating heavy meals or foods, such as meat, fried foods, or fatty foods. ? 6 hours before the procedure - stop eating  light meals or foods, such as toast or cereal. ? 6 hours before the procedure - stop drinking milk or drinks that contain milk. ? 2 hours before the procedure - stop drinking clear liquids. General instructions  Plan to have someone take you home from the hospital or clinic.  If you will be going home right after the procedure, plan to have someone with you for 24 hours.  You may have blood tests.  Do not use any products that contain nicotine or tobacco for at least 4-6 weeks before the procedure. These products include cigarettes, e-cigarettes, and chewing tobacco. If you need help quitting, ask your health care provider.  Ask your health care provider what steps will be taken to help prevent infection. These may include: ? Removing hair at the surgery site. ? Washing skin with a germ-killing soap. ? Taking antibiotic medicine. What happens during the procedure?   An IV will be inserted into one of your veins.  You will be given one or more of the following: ? A medicine to help you relax (sedative). ? A medicine to numb the area (local anesthetic).  Two small incisions will be made to insert the port. ? One smaller incision will be made in your neck to get access to the vein where the catheter will lie. ? The other incision will be made in the upper chest. This is where the port will lie.  The procedure may be done using continuous X-ray (fluoroscopy) or other imaging tools for guidance.  The port and catheter will be placed. There may be a small, raised area where the port is.  The port will be flushed with a salt solution (saline), and blood will be drawn to make sure that it is working correctly.  The incisions will be closed.  Bandages (dressings) may be placed over the incisions. The procedure may vary among health care providers and hospitals. What happens after the procedure?  Your blood pressure, heart rate, breathing rate, and blood oxygen level will be monitored  until you leave the hospital or clinic.  Do not drive for 24 hours if you were given a sedative during your procedure.  You will be given a manufacturer's information card for the type of port that you have. Keep this with you.  Your port will need to be flushed and checked as told by your health care provider, usually every few weeks.  A chest X-ray will be done to: ? Check the placement of the port. ? Make sure there is no injury to your lung. Summary  Implanted port insertion is a procedure to put in a port and catheter.  The implanted port is used as a long-term IV access.  The port will need to be flushed and checked as told by your health care provider, usually every few weeks.  Keep your manufacturer's information card with you at all times. This information is not intended to replace advice given to you by your health care provider. Make sure you discuss any questions you have with your  health care provider. Document Revised: 02/26/2019 Document Reviewed: 06/02/2018 Elsevier Patient Education  [place].

## 2020-06-27 ENCOUNTER — Ambulatory Visit (HOSPITAL_COMMUNITY)
Admission: RE | Admit: 2020-06-27 | Discharge: 2020-06-27 | Disposition: A | Discharge status: Home / Self Care | Payer: Medicare HMO | Source: Ambulatory Visit | Attending: Hematology | Admitting: Hematology

## 2020-06-27 DIAGNOSIS — C801 Malignant (primary) neoplasm, unspecified: Secondary | ICD-10-CM | POA: Insufficient documentation

## 2020-06-27 DIAGNOSIS — C786 Secondary malignant neoplasm of retroperitoneum and peritoneum: Secondary | ICD-10-CM | POA: Diagnosis not present

## 2020-06-27 LAB — SARS CORONAVIRUS 2 (TAT 6-24 HRS): SARS Coronavirus 2: NEGATIVE

## 2020-06-27 MED ORDER — IOHEXOL 300 MG/ML  SOLN
75.0000 mL | Freq: Once | INTRAMUSCULAR | Status: AC | PRN
  Administered 2020-06-27: 75 mL via INTRAVENOUS

## 2020-06-28 ENCOUNTER — Other Ambulatory Visit: Payer: Self-pay

## 2020-06-28 ENCOUNTER — Ambulatory Visit (HOSPITAL_COMMUNITY): Payer: Medicare HMO | Admitting: Certified Registered Nurse Anesthetist

## 2020-06-28 ENCOUNTER — Encounter (HOSPITAL_COMMUNITY): Payer: Self-pay | Admitting: General Surgery

## 2020-06-28 ENCOUNTER — Ambulatory Visit (HOSPITAL_COMMUNITY)
Admission: RE | Admit: 2020-06-28 | Discharge: 2020-06-28 | Disposition: A | Discharge status: Home / Self Care | Payer: Medicare HMO | Attending: General Surgery | Admitting: General Surgery

## 2020-06-28 ENCOUNTER — Ambulatory Visit (HOSPITAL_COMMUNITY): Payer: Medicare HMO

## 2020-06-28 ENCOUNTER — Encounter (HOSPITAL_COMMUNITY)
Admission: RE | Disposition: A | Discharge status: Home / Self Care | Payer: Self-pay | Source: Home / Self Care | Attending: General Surgery

## 2020-06-28 DIAGNOSIS — D649 Anemia, unspecified: Secondary | ICD-10-CM | POA: Diagnosis not present

## 2020-06-28 DIAGNOSIS — Z9981 Dependence on supplemental oxygen: Secondary | ICD-10-CM | POA: Diagnosis not present

## 2020-06-28 DIAGNOSIS — J449 Chronic obstructive pulmonary disease, unspecified: Secondary | ICD-10-CM | POA: Diagnosis not present

## 2020-06-28 DIAGNOSIS — G629 Polyneuropathy, unspecified: Secondary | ICD-10-CM | POA: Diagnosis not present

## 2020-06-28 DIAGNOSIS — Z7982 Long term (current) use of aspirin: Secondary | ICD-10-CM | POA: Diagnosis not present

## 2020-06-28 DIAGNOSIS — C786 Secondary malignant neoplasm of retroperitoneum and peritoneum: Secondary | ICD-10-CM | POA: Insufficient documentation

## 2020-06-28 DIAGNOSIS — Z79899 Other long term (current) drug therapy: Secondary | ICD-10-CM | POA: Insufficient documentation

## 2020-06-28 DIAGNOSIS — C801 Malignant (primary) neoplasm, unspecified: Secondary | ICD-10-CM | POA: Diagnosis not present

## 2020-06-28 DIAGNOSIS — E7800 Pure hypercholesterolemia, unspecified: Secondary | ICD-10-CM | POA: Insufficient documentation

## 2020-06-28 DIAGNOSIS — Z87891 Personal history of nicotine dependence: Secondary | ICD-10-CM | POA: Diagnosis not present

## 2020-06-28 DIAGNOSIS — I10 Essential (primary) hypertension: Secondary | ICD-10-CM | POA: Diagnosis not present

## 2020-06-28 DIAGNOSIS — Z95828 Presence of other vascular implants and grafts: Secondary | ICD-10-CM

## 2020-06-28 HISTORY — PX: PORTACATH PLACEMENT: SHX2246

## 2020-06-28 SURGERY — INSERTION, TUNNELED CENTRAL VENOUS DEVICE, WITH PORT
Anesthesia: General | Site: Chest | Laterality: Left

## 2020-06-28 MED ORDER — CEFAZOLIN SODIUM-DEXTROSE 2-4 GM/100ML-% IV SOLN
2.0000 g | INTRAVENOUS | Status: AC
  Administered 2020-06-28: 2 g via INTRAVENOUS
  Filled 2020-06-28: qty 100

## 2020-06-28 MED ORDER — PROPOFOL 10 MG/ML IV BOLUS
INTRAVENOUS | Status: AC
  Filled 2020-06-28: qty 20

## 2020-06-28 MED ORDER — LACTATED RINGERS IV SOLN: INTRAVENOUS | Status: DC

## 2020-06-28 MED ORDER — CHLORHEXIDINE GLUCONATE CLOTH 2 % EX PADS: 6.0000 | MEDICATED_PAD | Freq: Once | CUTANEOUS | Status: DC

## 2020-06-28 MED ORDER — PROPOFOL 500 MG/50ML IV EMUL
INTRAVENOUS | Status: DC | PRN
  Administered 2020-06-28: 75 ug/kg/min via INTRAVENOUS

## 2020-06-28 MED ORDER — FENTANYL CITRATE (PF) 100 MCG/2ML IJ SOLN
INTRAMUSCULAR | Status: AC
  Filled 2020-06-28: qty 2

## 2020-06-28 MED ORDER — CHLORHEXIDINE GLUCONATE 0.12 % MT SOLN
15.0000 mL | Freq: Once | OROMUCOSAL | Status: AC
  Administered 2020-06-28: 15 mL via OROMUCOSAL

## 2020-06-28 MED ORDER — HEPARIN SOD (PORK) LOCK FLUSH 100 UNIT/ML IV SOLN
INTRAVENOUS | Status: DC | PRN
  Administered 2020-06-28: 500 [IU] via INTRAVENOUS

## 2020-06-28 MED ORDER — ONDANSETRON HCL 4 MG/2ML IJ SOLN: 4.0000 mg | Freq: Once | INTRAMUSCULAR | Status: DC | PRN

## 2020-06-28 MED ORDER — ORAL CARE MOUTH RINSE: 15.0000 mL | Freq: Once | OROMUCOSAL | Status: AC

## 2020-06-28 MED ORDER — LIDOCAINE HCL (PF) 1 % IJ SOLN
INTRAMUSCULAR | Status: DC | PRN
  Administered 2020-06-28: 8 mL

## 2020-06-28 MED ORDER — ONDANSETRON HCL 4 MG/2ML IJ SOLN
INTRAMUSCULAR | Status: AC
  Filled 2020-06-28: qty 2

## 2020-06-28 MED ORDER — HEPARIN SOD (PORK) LOCK FLUSH 100 UNIT/ML IV SOLN
INTRAVENOUS | Status: AC
  Filled 2020-06-28: qty 5

## 2020-06-28 MED ORDER — HYDROCODONE-ACETAMINOPHEN 5-325 MG PO TABS: 1.0000 | ORAL_TABLET | ORAL | 0 refills | Status: DC | PRN

## 2020-06-28 MED ORDER — MIDAZOLAM HCL 5 MG/5ML IJ SOLN
INTRAMUSCULAR | Status: DC | PRN
  Administered 2020-06-28: 2 mg via INTRAVENOUS

## 2020-06-28 MED ORDER — LIDOCAINE 2% (20 MG/ML) 5 ML SYRINGE
INTRAMUSCULAR | Status: AC
  Filled 2020-06-28: qty 5

## 2020-06-28 MED ORDER — PHENYLEPHRINE 40 MCG/ML (10ML) SYRINGE FOR IV PUSH (FOR BLOOD PRESSURE SUPPORT)
PREFILLED_SYRINGE | INTRAVENOUS | Status: DC | PRN
  Administered 2020-06-28 (×5): 80 ug via INTRAVENOUS

## 2020-06-28 MED ORDER — SODIUM CHLORIDE (PF) 0.9 % IJ SOLN
INTRAMUSCULAR | Status: DC | PRN
  Administered 2020-06-28: 500 mL

## 2020-06-28 MED ORDER — LIDOCAINE HCL (PF) 1 % IJ SOLN
INTRAMUSCULAR | Status: AC
  Filled 2020-06-28: qty 30

## 2020-06-28 MED ORDER — PHENYLEPHRINE 40 MCG/ML (10ML) SYRINGE FOR IV PUSH (FOR BLOOD PRESSURE SUPPORT)
PREFILLED_SYRINGE | INTRAVENOUS | Status: AC
  Filled 2020-06-28: qty 10

## 2020-06-28 MED ORDER — FENTANYL CITRATE (PF) 100 MCG/2ML IJ SOLN: 25.0000 ug | INTRAMUSCULAR | Status: DC | PRN

## 2020-06-28 MED ORDER — MIDAZOLAM HCL 2 MG/2ML IJ SOLN
INTRAMUSCULAR | Status: AC
  Filled 2020-06-28: qty 2

## 2020-06-28 MED ORDER — FENTANYL CITRATE (PF) 100 MCG/2ML IJ SOLN
INTRAMUSCULAR | Status: DC | PRN
  Administered 2020-06-28 (×2): 50 ug via INTRAVENOUS

## 2020-06-28 MED ORDER — ONDANSETRON HCL 4 MG/2ML IJ SOLN
INTRAMUSCULAR | Status: DC | PRN
  Administered 2020-06-28: 4 mg via INTRAVENOUS

## 2020-06-28 SURGICAL SUPPLY — 36 items
ADH SKN CLS APL DERMABOND .7 (GAUZE/BANDAGES/DRESSINGS) ×1
APL PRP STRL LF ISPRP CHG 10.5 (MISCELLANEOUS) ×1
APPLICATOR CHLORAPREP 10.5 ORG (MISCELLANEOUS) ×3 IMPLANT
BAG DECANTER FOR FLEXI CONT (MISCELLANEOUS) ×3 IMPLANT
CLOTH BEACON ORANGE TIMEOUT ST (SAFETY) ×3 IMPLANT
COVER LIGHT HANDLE STERIS (MISCELLANEOUS) ×6 IMPLANT
COVER WAND RF STERILE (DRAPES) ×3 IMPLANT
DECANTER SPIKE VIAL GLASS SM (MISCELLANEOUS) ×3 IMPLANT
DERMABOND ADVANCED (GAUZE/BANDAGES/DRESSINGS) ×2
DERMABOND ADVANCED .7 DNX12 (GAUZE/BANDAGES/DRESSINGS) ×1 IMPLANT
DRAPE C-ARM FOLDED MOBILE STRL (DRAPES) ×3 IMPLANT
ELECT REM PT RETURN 9FT ADLT (ELECTROSURGICAL) ×3
ELECTRODE REM PT RTRN 9FT ADLT (ELECTROSURGICAL) ×1 IMPLANT
GLOVE BIO SURGEON STRL SZ 6.5 (GLOVE) ×2 IMPLANT
GLOVE BIO SURGEONS STRL SZ 6.5 (GLOVE) ×1
GLOVE BIOGEL M 7.0 STRL (GLOVE) ×3 IMPLANT
GLOVE BIOGEL PI IND STRL 6.5 (GLOVE) ×1 IMPLANT
GLOVE BIOGEL PI IND STRL 7.0 (GLOVE) ×1 IMPLANT
GLOVE BIOGEL PI INDICATOR 6.5 (GLOVE) ×2
GLOVE BIOGEL PI INDICATOR 7.0 (GLOVE) ×2
GOWN STRL REUS W/TWL LRG LVL3 (GOWN DISPOSABLE) ×6 IMPLANT
IV NS 500ML (IV SOLUTION) ×3
IV NS 500ML BAXH (IV SOLUTION) ×1 IMPLANT
KIT PORT POWER 8FR ISP MRI (Port) ×3 IMPLANT
KIT TURNOVER KIT A (KITS) ×3 IMPLANT
MANIFOLD NEPTUNE II (INSTRUMENTS) ×3 IMPLANT
NEEDLE HYPO 25X1 1.5 SAFETY (NEEDLE) ×3 IMPLANT
PACK MINOR (CUSTOM PROCEDURE TRAY) ×3 IMPLANT
PAD ARMBOARD 7.5X6 YLW CONV (MISCELLANEOUS) ×3 IMPLANT
SET BASIN LINEN APH (SET/KITS/TRAYS/PACK) ×3 IMPLANT
SUT MNCRL AB 4-0 PS2 18 (SUTURE) ×3 IMPLANT
SUT PROLENE 2 0 SH 30 (SUTURE) ×3 IMPLANT
SUT VIC AB 3-0 SH 27 (SUTURE) ×3
SUT VIC AB 3-0 SH 27X BRD (SUTURE) ×1 IMPLANT
SYR 10ML LL (SYRINGE) ×6 IMPLANT
SYR CONTROL 10ML LL (SYRINGE) ×3 IMPLANT

## 2020-06-28 NOTE — Anesthesia Postprocedure Evaluation (Signed)
Anesthesia Post Note  Patient: [name]  Procedure(s) Performed: PORT-A-CATHETER PLACEMENT LEFT CHEST (attached catheter in left subclavian) (Left Chest)  Patient location during evaluation: PACU Anesthesia Type: General Level of consciousness: awake Pain management: pain level controlled Vital Signs Assessment: post-procedure vital signs reviewed and stable Respiratory status: spontaneous breathing Cardiovascular status: blood pressure returned to baseline Anesthetic complications: no   No complications documented.   Last Vitals:  Vitals:   06/28/20 0930 06/28/20 0949  BP: 110/64 121/77  Pulse: 70 73  Resp: 15 16  Temp:  36.6 C  SpO2: 96% 96%    Last Pain:  Vitals:   06/28/20 0949  TempSrc: Oral  PainSc: 0-No pain                 Louann Sjogren

## 2020-06-28 NOTE — Transfer of Care (Signed)
Immediate Anesthesia Transfer of Care Note  Patient: [name]  Procedure(s) Performed: PORT-A-CATHETER PLACEMENT LEFT CHEST (attached catheter in left subclavian) (Left Chest)  Patient Location: PACU  Anesthesia Type:MAC  Level of Consciousness: awake, alert  and oriented  Airway & Oxygen Therapy: Patient Spontanous Breathing and Patient connected to nasal cannula oxygen  Post-op Assessment: Report given to RN and Post -op Vital signs reviewed and stable  Post vital signs: Reviewed and stable  Last Vitals:  Vitals Value Taken Time  BP 100/59 06/28/20 0911  Temp    Pulse 73 06/28/20 0912  Resp 16 06/28/20 0912  SpO2 95 % 06/28/20 0912  Vitals shown include unvalidated device data.  Last Pain:  Vitals:   06/28/20 0720  TempSrc: Oral  PainSc: 0-No pain      Patients Stated Pain Goal: 8 (16/55/37 [id number])  Complications: No complications documented.

## 2020-06-28 NOTE — Anesthesia Preprocedure Evaluation (Signed)
Anesthesia Evaluation  Patient identified by MRN, date of birth, ID band Patient awake    Reviewed: Allergy & Precautions, H&P , NPO status , Patient's Chart, lab work & pertinent test results, reviewed documented beta blocker date and time   Airway Mallampati: II  TM Distance: >3 FB Neck ROM: full    Dental no notable dental hx. (+) Edentulous Upper, Edentulous Lower   Pulmonary asthma , COPD, former smoker,    Pulmonary exam normal breath sounds clear to auscultation       Cardiovascular Exercise Tolerance: Good hypertension, negative cardio ROS   Rhythm:regular Rate:Normal     Neuro/Psych negative neurological ROS  negative psych ROS   GI/Hepatic negative GI ROS, Neg liver ROS,   Endo/Other  negative endocrine ROS  Renal/GU negative Renal ROS  negative genitourinary   Musculoskeletal   Abdominal   Peds  Hematology  (+) Blood dyscrasia, anemia ,   Anesthesia Other Findings   Reproductive/Obstetrics negative OB ROS                             Anesthesia Physical Anesthesia Plan  ASA: III  Anesthesia Plan: General   Post-op Pain Management:    Induction:   PONV Risk Score and Plan: 3  Airway Management Planned:   Additional Equipment:   Intra-op Plan:   Post-operative Plan:   Informed Consent: I have reviewed the patients History and Physical, chart, labs and discussed the procedure including the risks, benefits and alternatives for the proposed anesthesia with the patient or authorized representative who has indicated his/her understanding and acceptance.     Dental Advisory Given  Plan Discussed with: CRNA  Anesthesia Plan Comments:         Anesthesia Quick Evaluation

## 2020-06-28 NOTE — Discharge Instructions (Signed)
Keep area clean and dry. You can take a shower in 24 hours. Do not submerge in water for 4 weeks.  Take tylenol and ibuprofen for pain control and Norco for severe pain.   Implanted Port Insertion, Care After This sheet gives you information about how to care for yourself after your procedure. Your health care provider may also give you more specific instructions. If you have problems or questions, contact your health care provider. What can I expect after the procedure? After the procedure, it is common to have:  Discomfort at the port insertion site.  Bruising on the skin over the port. This should improve over 3-4 days. Follow these instructions at home: [facility] care  After your port is placed, you will get a manufacturer's information card. The card has information about your port. Keep this card with you at all times.  Take care of the port as told by your health care provider. Ask your health care provider if you or a family member can get training for taking care of the port at home. A home health care nurse may also take care of the port.  Make sure to remember what type of port you have. Incision care      Follow instructions from your health care provider about how to take care of your port insertion site. Make sure you: ? Wash your hands with soap and water before and after you change your bandage (dressing). If soap and water are not available, use hand sanitizer. ? Change your dressing as told by your health care provider. ? Leave stitches (sutures), skin glue, or adhesive strips in place. These skin closures may need to stay in place for 2 weeks or longer. If adhesive strip edges start to loosen and curl up, you may trim the loose edges. Do not remove adhesive strips completely unless your health care provider tells you to do that.  Check your port insertion site every day for signs of infection. Check for: ? Redness, swelling, or pain. ? Fluid or blood. ? Warmth. ? Pus  or a bad smell. Activity  Return to your normal activities as told by your health care provider. Ask your health care provider what activities are safe for you.  Do not lift anything that is heavier than 10 lb (4.5 kg) 1-2 weeks General instructions  Take over-the-counter and prescription medicines only as told by your health care provider.  Do not take baths, swim, or use a hot tub until your health care provider approves.   You may shower.   Do not drive for 24 hours if you were given a sedative during your procedure.  Wear a medical alert bracelet in case of an emergency. This will tell any health care providers that you have a port.  Keep all follow-up visits as told by your health care provider. This is important. Contact a health care provider if:  You cannot flush your port with saline as directed, or you cannot draw blood from the port.  You have a fever or chills.  You have redness, swelling, or pain around your port insertion site.  You have fluid or blood coming from your port insertion site.  Your port insertion site feels warm to the touch.  You have pus or a bad smell coming from the port insertion site. Get help right away if:  You have chest pain or shortness of breath.  You have bleeding from your port that you cannot control. Summary  Take care  of the port as told by your health care provider. Keep the manufacturer's information card with you at all times.  Change your dressing as told by your health care provider.  Contact a health care provider if you have a fever or chills or if you have redness, swelling, or pain around your port insertion site.  Keep all follow-up visits as told by your health care provider. This information is not intended to replace advice given to you by your health care provider. Make sure you discuss any questions you have with your health care provider. Document Revised: 06/02/2018 Document Reviewed: 06/02/2018 Elsevier  Patient Education  [place].

## 2020-06-28 NOTE — Op Note (Signed)
Operative Note 06/28/20   Preoperative Diagnosis:  Peritoneal carcinomatosis   Postoperative Diagnosis: Same   Procedure(s) Performed: Port-A-Cath placement, left subclavian vein    Surgeon: Lanell Matar. Constance Haw, MD   Assistants: No qualified resident was available   Anesthesia: Monitored anesthesia care   Anesthesiologist: Louann Sjogren, MD    Specimens: None   Estimated Blood Loss: Minimal   Fluoroscopy time: 27 seconds   Blood Replacement: None    Complications: None    Operative Findings: Normal anatomy  Indications:  [name] is a 64 yo with newly diagnosed peritoneal carcinomatosis who needs a port a catheter. We discussed the risk of bleeding, infection, issues with healing due to prednisone, malfunction of the catheter, and pneumothorax, and she opted to proceed.  Procedure: The patient was brought into the operating room and monitored anesthesia was induced.  One percent lidocaine was used for local anesthesia.   The left chest and neck was prepped and draped in the usual sterile fashion.  Preoperative antibiotics were given.  An incision was made below the left clavicle. A subcutaneous pocket was formed. The needles advanced into the left subclavian vein using the Seldinger technique without difficulty. A guidewire was then advanced into the right atrium under fluoroscopic guidance.  Ectopia was not noted. An introducer and peel-away sheath were placed over the guidewire. The catheter was then inserted through the peel-away sheath and the peel-away sheath was removed.  A spot film was performed to confirm the position. The catheter was then attached to the port and the port placed in subcutaneous pocket. Adequate positioning was confirmed by fluoroscopy. Hemostasis was confirmed, and the port was secured with 2-0 prolene sutures.  Good backflow of blood was noted on aspiration of the port. The port was flushed with heparin flush. Subcutaneous layer was reapproximated using a  3-0 Vicryl interrupted suture. The skin was closed using a 4-0 Vicryl subcuticular suture. Dermabond was applied.    All tape and needle counts were correct at the end of the procedure. The patient was transferred to PACU in stable condition. A chest x-ray will be performed at that time.  [name], MD [facility] [address] [place], [place] [address] [phone] (office)

## 2020-06-28 NOTE — Progress Notes (Signed)
[facility]  Notified boyfriend, Abe People that surgery completed. CXR completed. Norco Rx sent.  [name], MD [facility] [address] [place], [place] [address] [phone] (office)

## 2020-06-28 NOTE — Interval H&P Note (Signed)
History and Physical Interval Note:  06/28/2020 8:18 AM  [name]  has presented today for surgery, with the diagnosis of Peritoneal carcinomatosis.  The various methods of treatment have been discussed with the patient and family. After consideration of risks, benefits and other options for treatment, the patient has consented to  Procedure(s): PORT-A-CATHETER  PLACEMENT as a surgical intervention.  The patient's history has been reviewed, patient examined, no change in status, stable for surgery.  I have reviewed the patient's chart and labs.  Questions were answered to the patient's satisfaction.    No changes.  Virl Cagey

## 2020-06-29 ENCOUNTER — Encounter (HOSPITAL_COMMUNITY): Payer: Self-pay | Admitting: General Surgery

## 2020-06-30 ENCOUNTER — Inpatient Hospital Stay (HOSPITAL_COMMUNITY): Payer: Medicare HMO | Attending: Hematology | Admitting: Hematology

## 2020-06-30 VITALS — BP 147/74 | HR 112 | Temp 97.8°F | Resp 18 | Wt 124.2 lb

## 2020-06-30 DIAGNOSIS — Z8249 Family history of ischemic heart disease and other diseases of the circulatory system: Secondary | ICD-10-CM | POA: Diagnosis not present

## 2020-06-30 DIAGNOSIS — Z8042 Family history of malignant neoplasm of prostate: Secondary | ICD-10-CM | POA: Insufficient documentation

## 2020-06-30 DIAGNOSIS — Z5189 Encounter for other specified aftercare: Secondary | ICD-10-CM | POA: Insufficient documentation

## 2020-06-30 DIAGNOSIS — Z87891 Personal history of nicotine dependence: Secondary | ICD-10-CM | POA: Insufficient documentation

## 2020-06-30 DIAGNOSIS — C786 Secondary malignant neoplasm of retroperitoneum and peritoneum: Secondary | ICD-10-CM | POA: Insufficient documentation

## 2020-06-30 DIAGNOSIS — Z79899 Other long term (current) drug therapy: Secondary | ICD-10-CM | POA: Diagnosis not present

## 2020-06-30 DIAGNOSIS — Z7189 Other specified counseling: Secondary | ICD-10-CM | POA: Diagnosis not present

## 2020-06-30 DIAGNOSIS — Z7982 Long term (current) use of aspirin: Secondary | ICD-10-CM | POA: Diagnosis not present

## 2020-06-30 DIAGNOSIS — Z85028 Personal history of other malignant neoplasm of stomach: Secondary | ICD-10-CM | POA: Diagnosis not present

## 2020-06-30 DIAGNOSIS — Z5111 Encounter for antineoplastic chemotherapy: Secondary | ICD-10-CM | POA: Diagnosis not present

## 2020-06-30 DIAGNOSIS — C801 Malignant (primary) neoplasm, unspecified: Secondary | ICD-10-CM | POA: Diagnosis not present

## 2020-06-30 DIAGNOSIS — R188 Other ascites: Secondary | ICD-10-CM | POA: Insufficient documentation

## 2020-06-30 DIAGNOSIS — D649 Anemia, unspecified: Secondary | ICD-10-CM | POA: Diagnosis not present

## 2020-06-30 DIAGNOSIS — Z7952 Long term (current) use of systemic steroids: Secondary | ICD-10-CM | POA: Diagnosis not present

## 2020-06-30 DIAGNOSIS — C569 Malignant neoplasm of unspecified ovary: Secondary | ICD-10-CM | POA: Insufficient documentation

## 2020-06-30 DIAGNOSIS — I10 Essential (primary) hypertension: Secondary | ICD-10-CM | POA: Diagnosis not present

## 2020-06-30 DIAGNOSIS — J449 Chronic obstructive pulmonary disease, unspecified: Secondary | ICD-10-CM | POA: Insufficient documentation

## 2020-06-30 DIAGNOSIS — Z800 Family history of malignant neoplasm of digestive organs: Secondary | ICD-10-CM | POA: Insufficient documentation

## 2020-06-30 DIAGNOSIS — E7800 Pure hypercholesterolemia, unspecified: Secondary | ICD-10-CM | POA: Insufficient documentation

## 2020-06-30 NOTE — Progress Notes (Signed)
[place], [place] [address]   CLINIC:  Medical Oncology/Hematology  PCP:  Ranae Plumber, [place] / [place] [address] [phone]   REASON FOR VISIT:  Follow-up for high-grade adenocarcinoma  PRIOR THERAPY: None  NGS Results: Not done  CURRENT THERAPY: Under work-up  BRIEF ONCOLOGIC HISTORY:  Oncology History   No history exists.    CANCER STAGING: Cancer Staging No matching staging information was found for the patient.  INTERVAL HISTORY:  [name], a 64 y.o. female, returns for routine follow-up of her high-grade adenocarcinoma. [name] was last seen on 06/06/2020.  Today she is accompanied by her sister. She complains of abdominal pain in her periumbilical area which became very severe last night and she took 1 tablet of Norco which helped; this is only the second time she experienced this kind of pain. She reports that she has not seen the gynecologic oncologist or the geneticist. She has intermittent non-painful numbness in bilat legs below knees; she takes gabapentin for it. She is on 3 L of oxygen via [place] around the clock.   REVIEW OF SYSTEMS:  Review of Systems  Constitutional: Positive for appetite change (mildly decreased) and fatigue (mild).  Gastrointestinal: Positive for abdominal pain (10/10 abdominal pain).  Neurological: Positive for numbness (legs).  All other systems reviewed and are negative.   PAST MEDICAL/SURGICAL HISTORY:  Past Medical History:  Diagnosis Date  . Anemia   . Asthma   . Cancer [facility])    Gastric Cancer  . COPD (chronic obstructive pulmonary disease) ([place])   . High cholesterol   . Hypertension   . Neuropathy   . Pneumonia 2019   Past Surgical History:  Procedure Laterality Date  . HALLUX VALGUS BASE WEDGE Right 06/09/2015   Procedure: Base wedge osteotomy with modified McBride right foot ;  Surgeon: Sharlotte Alamo, MD;  Location: [facility] ORS;  Service: Podiatry;   Laterality: Right;  . PORTACATH PLACEMENT Left 06/28/2020   Procedure: PORT-A-CATHETER PLACEMENT LEFT CHEST (attached catheter in left subclavian);  Surgeon: Virl Cagey, MD;  Location: [facility];  Service: General;  Laterality: Left;    SOCIAL HISTORY:  Social History   Socioeconomic History  . Marital status: Divorced    Spouse name: Not on file  . Number of children: 3  . Years of education: Not on file  . Highest education level: Not on file  Occupational History  . Occupation: DISABLED  Tobacco Use  . Smoking status: Former Smoker    Quit date: 11/04/2013    Years since quitting: 6.6  . Smokeless tobacco: Never Used  Vaping Use  . Vaping Use: Never used  Substance and Sexual Activity  . Alcohol use: No  . Drug use: No  . Sexual activity: Not Currently  Other Topics Concern  . Not on file  Social History Narrative  . Not on file   Social Determinants of Health   Financial Resource Strain: Medium Risk  . Difficulty of Paying Living Expenses: Somewhat hard  Food Insecurity: No Food Insecurity  . Worried About Charity fundraiser in the Last Year: Never true  . Ran Out of Food in the Last Year: Never true  Transportation Needs: No Transportation Needs  . Lack of Transportation (Medical): No  . Lack of Transportation (Non-Medical): No  Physical Activity: Inactive  . Days of Exercise per Week: 0 days  . Minutes of Exercise per Session: 0 min  Stress:  Stress Concern Present  . Feeling of Stress : To some extent  Social Connections: Moderately Isolated  . Frequency of Communication with Friends and Family: More than three times a week  . Frequency of Social Gatherings with Friends and Family: Never  . Attends Religious Services: More than 4 times per year  . Active Member of Clubs or Organizations: No  . Attends Archivist Meetings: Never  . Marital Status: Divorced  Human resources officer Violence: Not At Risk  . Fear of Current or Ex-Partner: No  .  Emotionally Abused: No  . Physically Abused: No  . Sexually Abused: No    FAMILY HISTORY:  Family History  Problem Relation Age of Onset  . Alzheimer's disease Mother   . COPD Father   . Emphysema Father   . Hypertension Father   . Healthy Sister   . Healthy Brother   . Alzheimer's disease Maternal Grandmother   . Colon cancer Paternal Grandmother   . Healthy Sister   . Healthy Sister   . Healthy Sister   . Breast cancer Neg Hx     CURRENT MEDICATIONS:  Current Outpatient Medications  Medication Sig Dispense Refill  . albuterol (PROVENTIL HFA;VENTOLIN HFA) 108 (90 BASE) MCG/ACT inhaler Inhale 4-6 puffs into the lungs every 6 (six) hours as needed for wheezing or shortness of breath.     [place] Kitchen amLODipine (NORVASC) 5 MG tablet Take 5 mg by mouth at bedtime.     [place] Kitchen aspirin EC 81 MG tablet Take 81 mg by mouth daily.    . ferrous sulfate 325 (65 FE) MG tablet Take 325 mg by mouth 2 (two) times daily with a meal.     . Fluticasone-Umeclidin-Vilant (TRELEGY ELLIPTA) 100-62.5-25 MCG/INH AEPB Inhale into the lungs.    . gabapentin (NEURONTIN) 300 MG capsule Take 300 mg by mouth 3 (three) times daily.     Mar[place] Kitchen HYDROcodone-acetaminophen (NORCO) 5-325 MG tablet Take 1 tablet by mouth every 4 (four) hours as needed for severe pain. 6 tablet 0  . ipratropium (ATROVENT HFA) 17 MCG/ACT inhaler Inhale 2 puffs into the lungs every 6 (six) hours as needed for wheezing.     Mar[place] Kitchen ipratropium-albuterol (DUONEB) 0.5-2.5 (3) MG/3ML SOLN Take 0.5 mg by nebulization every 6 (six) hours as needed.     . nortriptyline (PAMELOR) 25 MG capsule Take 25 mg by mouth at bedtime.    . OXYGEN Inhale 3 L into the lungs continuous.     . pantoprazole (PROTONIX) 40 MG tablet Take 1 tablet (40 mg total) by mouth daily. 30 tablet 2  . predniSONE (DELTASONE) 5 MG tablet Take 5 mg by mouth daily with breakfast.    . simvastatin (ZOCOR) 40 MG tablet Take 40 mg by mouth at bedtime.     No current facility-administered medications  for this visit.    ALLERGIES:  No Known Allergies  PHYSICAL EXAM:  Performance status (ECOG): 1 - Symptomatic but completely ambulatory  Vitals:   06/30/20 1247  BP: (!) 147/74  Pulse: (!) 112  Resp: 18  Temp: 97.8 F (36.6 C)  SpO2: 97%   Wt Readings from Last 3 Encounters:  06/30/20 124 lb 3.2 oz (56.3 kg)  06/28/20 121 lb 14.6 oz (55.3 kg)  06/22/20 122 lb (55.3 kg)   Physical Exam Vitals reviewed.  Constitutional:      Appearance: Normal appearance.     Interventions: Nasal cannula in place.  Cardiovascular:     Rate and Rhythm: Normal rate and regular  rhythm.     Pulses: Normal pulses.     Heart sounds: Normal heart sounds.  Pulmonary:     Effort: Pulmonary effort is normal.     Breath sounds: Examination of the right-upper field reveals wheezing. Examination of the left-upper field reveals wheezing. Examination of the right-lower field reveals rhonchi. Examination of the left-lower field reveals rhonchi. Wheezing and rhonchi present.  Chest:     Comments: Port-a-Cath in L chest Abdominal:     Palpations: Abdomen is soft. There is no mass.     Tenderness: There is abdominal tenderness in the periumbilical area.     Hernia: No hernia is present.  Musculoskeletal:     Right lower leg: No edema.     Left lower leg: No edema.  Neurological:     General: No focal deficit present.     Mental Status: She is alert and oriented to person, place, and time.  Psychiatric:        Mood and Affect: Mood normal.        Behavior: Behavior normal.      LABORATORY DATA:  I have reviewed the labs as listed.  CBC Latest Ref Rng & Units 06/13/2020 05/29/2020 09/16/2018  WBC 4.0 - 10.5 K/uL 6.6 10.5 7.9  Hemoglobin 12.0 - 15.0 g/dL 13.5 12.8 9.4(L)  Hematocrit 36 - 46 % 43.8 41.3 29.6(L)  Platelets 150 - 400 K/uL 381 400 272   CMP Latest Ref Rng & Units 05/29/2020 05/17/2019 11/02/2018  Glucose 70 - 99 mg/dL 101(H) - -  BUN 8 - 23 mg/dL 8 - -  Creatinine 0.44 - 1.00 mg/dL  0.47 0.60 0.60  Sodium 135 - 145 mmol/L 141 - -  Potassium 3.5 - 5.1 mmol/L 4.1 - -  Chloride 98 - 111 mmol/L 99 - -  CO2 22 - 32 mmol/L 32 - -  Calcium 8.9 - 10.3 mg/dL 9.4 - -  Total Protein 6.5 - 8.1 g/dL 6.6 - -  Total Bilirubin 0.3 - 1.2 mg/dL 0.3 - -  Alkaline Phos 38 - 126 U/L 85 - -  AST 15 - 41 U/L 21 - -  ALT 0 - 44 U/L 12 - -  No results found for: CA125  Surgical pathology ([id number]) on 06/13/2020: Omentum, needle core biopsy: high-grade adenocarcinoma.  DIAGNOSTIC IMAGING:  I have independently reviewed the scans and discussed with the patient. CT Chest W Contrast  Result Date: 06/28/2020 CLINICAL DATA:  Peritoneal carcinomatosis of unknown primary. EXAM: CT CHEST WITH CONTRAST TECHNIQUE: Multidetector CT imaging of the chest was performed during intravenous contrast administration. CONTRAST:  41mL OMNIPAQUE IOHEXOL 300 MG/ML  SOLN COMPARISON:  10/08/2019 FINDINGS: Cardiovascular: The heart size is normal. No substantial pericardial effusion. Coronary artery calcification is evident. Atherosclerotic calcification is noted in the wall of the thoracic aorta. Mediastinum/Nodes: 11 mm short axis low right paratracheal node identified on 49/2. 11 mm short axis precarinal node associated. Subcarinal lymphadenopathy measures 14 mm short axis. Small lymph nodes are seen in the hilar regions bilaterally. The esophagus has normal imaging features. Tiny hiatal hernia evident. There is no axillary lymphadenopathy. Lungs/Pleura: Centrilobular and paraseptal emphysema evident. Architectural distortion and scarring noted in the upper lungs bilaterally. Basilar predominant bronchial wall thickening with bronchiectasis and small airway impaction is noted bilaterally, involving the lingula and both lower lobes. Consolidative airspace disease is seen in the peripheral left lower lobe the base. No substantial pleural effusion. Upper Abdomen: Small volume fluid noted adjacent to the liver and  spleen.  Mild lymphadenopathy noted around the esophagogastric junction. Musculoskeletal: No worrisome lytic or sclerotic osseous abnormality. IMPRESSION: 1. No findings to suggest primary malignancy in the thorax. 2. Mediastinal lymphadenopathy is suspicious for metastatic disease. 3. Basilar predominant bronchial wall thickening with bronchiectasis and small airway impaction. Imaging features compatible with sequelae of chronic atypical infection although in the appropriate clinical setting, aspiration could have this appearance. There is a small region of associated consolidative opacity in the left base which may be related to atelectasis or infection. 4. Small volume ascites, incompletely visualized. 5. Aortic Atherosclerosis (ICD10-I70.0) and Emphysema (ICD10-J43.9). Electronically Signed   By: Misty Stanley M.D.   On: 06/28/2020 09:19   CT BIOPSY  Result Date: 06/13/2020 CLINICAL DATA:  Omental caking and trace abdominal ascites. No known primary EXAM: CT GUIDED CORE BIOPSY OF OMENTAL MASS ANESTHESIA/SEDATION: Intravenous Fentanyl 60mcg and Versed 1mg  were administered as conscious sedation during continuous monitoring of the patient's level of consciousness and physiological / cardiorespiratory status by the radiology RN, with a total moderate sedation time of 10 minutes. PROCEDURE: The procedure risks, benefits, and alternatives were explained to the patient. Questions regarding the procedure were encouraged and answered. The patient understands and consents to the procedure. Select axial scans through the mid abdomen obtained. Omental nodules were identified and an appropriate skin entry site was determined and marked. The operative field was prepped with chlorhexidinein a sterile fashion, and a sterile drape was applied covering the operative field. A sterile gown and sterile gloves were used for the procedure. Local anesthesia was provided with 1% Lidocaine. Under CT fluoroscopic guidance, a 17 gauge  trocar needle was advanced to the margin of the lesion. Once needle tip position was confirmed, coaxial 18-gauge core biopsy samples were obtained, submitted in formalin to surgical pathology. The guide needle was removed. Postprocedure scans show no hemorrhage or other apparent complication. The patient tolerated the procedure well. COMPLICATIONS: None immediate FINDINGS: Extensive omental caking was identified. Representative disease in the anterior mid abdomen was localized and representative core biopsy samples obtained as above. IMPRESSION: Technically successful CT-guided core biopsy, omental mass. Electronically Signed   By: Lucrezia Europe M.D.   On: 06/13/2020 15:53   DG Chest Port 1 View  Result Date: 06/28/2020 CLINICAL DATA:  Port placement EXAM: PORTABLE CHEST 1 VIEW COMPARISON:  2019 FINDINGS: Left chest wall port with catheter tip near the cavoatrial junction. No pneumothorax. Chronic interstitial prominence. Scarring at the lung bases. No pleural effusion. Similar cardiomediastinal contours. IMPRESSION: Left chest wall port with catheter tip near the cavoatrial junction. No pneumothorax. Electronically Signed   By: Macy Mis M.D.   On: 06/28/2020 09:23   DG C-Arm 1-60 Min-No Report  Result Date: 06/28/2020 Fluoroscopy was utilized by the requesting physician.  No radiographic interpretation.     ASSESSMENT:  1.  Peritoneal carcinomatosis: -Presentation to the ER with right lower quadrant abdominal pain on and off for 1 month. -CTAP on 05/29/2020 showed extensive nodularity throughout the omentum and upper peritoneal cavity.  Both ovaries are prominent although no well-defined mass is noted.  Small volume ascites. -CA-125 is elevated at 2407.  CEA was 2.4. -CT of the chest shows 11 mm right paratracheal lymph node and 11 mm short axis precarinal lymph node.  Subcarinal lymphadenopathy measuring 14 mm short axis.  This is suspicious for metastatic disease. -Needle biopsy of the omentum  on 06/13/2020 shows high-grade adenocarcinoma, morphology and IHC consistent with high-grade gynecological adenocarcinoma including high-grade ovarian serous carcinoma.  2.  COPD: -Quit smoking 6 years ago.  Smoked 2 packs/day for 25-30 years. -Has been on 3 L/min oxygen via nasal cannula for the last 5 years.  3.  Family history: -Paternal grandmother with colon cancer.   PLAN:  1.  High-grade serous ovarian carcinoma: -We discussed the results of the pathology report in detail. -We also discussed results of the CT scan of the chest which showed mediastinal adenopathy. -I have recommended chemotherapy with carboplatin and paclitaxel.  We discussed the schedule and side effects of chemotherapy. -We will follow up on the mediastinal adenopathy on CT scans after 2-3 cycles of chemotherapy. -He already has port placed.  I will make a referral to GYN oncology. -She reports that she is on Neurontin for weird feeling in her legs and off. -We will have to dose reduce paclitaxel.  We will also consider substituting it with docetaxel.  2.  Family history: -Because of personal history of high-grade ovarian cancer and family history of colon cancer, she will need genetic testing.   Orders placed this encounter:  No orders of the defined types were placed in this encounter.    Derek Jack, MD [place] [phone]   I, Milinda Antis, am acting as a scribe for Dr. Sanda Linger.  I, Derek Jack MD, have reviewed the above documentation for accuracy and completeness, and I agree with the above.

## 2020-06-30 NOTE — Patient Instructions (Addendum)
[place] at [facility] Discharge Instructions  You were seen and examined today by [name].   Your cancer will be treated with chemotherapy. Your regimen will consist of two different medications: carboplatin and taxol. This chemotherapy is given in the clinic once every three weeks for four cycles. Following the 3rd cycle, we will repeat scan to see how the cancer is responding.  Side effects of treatment include hair loss and brittle nails. You will likely experience fatigue related to the chemotherapy. Peripheral neuropathy is a potential side effect of chemotherapy but if it chemotherapy induced, it is reversible. Should your peripheral neuropathy worsen, please call the clinic and let us know.   Thank you for choosing [place] at [facility] to provide your oncology and hematology care.  To afford each patient quality time with our provider, please arrive at least 15 minutes before your scheduled appointment time.   If you have a lab appointment with the [facility] please come in thru the Main Entrance and check in at the main information desk.  You need to re-schedule your appointment should you arrive 10 or more minutes late.  We strive to give you quality time with our providers, and arriving late affects you and other patients whose appointments are after yours.  Also, if you no show three or more times for appointments you may be dismissed from the clinic at the providers discretion.     Again, thank you for choosing [facility].  Our hope is that these requests will decrease the amount of time that you wait before being seen by our physicians.       _____________________________________________________________  Should you have questions after your visit to [facility], please contact our office at [phone] and follow the prompts.  Our office hours are 8:00 a.m. and 4:30 p.m. Monday - Friday.   Please note that voicemails left after 4:00 p.m. may not be returned until the following business day.  We are closed weekends and major holidays.  You do have access to a nurse 24-7, just call the main number to the clinic [phone] and do not press any options, hold on the line and a nurse will answer the phone.    For prescription refill requests, have your pharmacy contact our office and allow 72 hours.    Due to Covid, you will need to wear a mask upon entering the hospital. If you do not have a mask, a mask will be given to you at the Main Entrance upon arrival. For doctor visits, patients may have 1 support person age 4 or older with them. For treatment visits, patients can not have anyone with them due to social distancing guidelines and our immunocompromised population.

## 2020-07-01 DIAGNOSIS — Z7189 Other specified counseling: Secondary | ICD-10-CM | POA: Insufficient documentation

## 2020-07-01 NOTE — Progress Notes (Signed)
START ON PATHWAY REGIMEN - Ovarian     A cycle is every 21 days:     Paclitaxel      Carboplatin   **Always confirm dose/schedule in your pharmacy ordering system**  Patient Characteristics: Preoperative or Nonsurgical Candidate (Clinical Staging), Newly Diagnosed, Neoadjuvant Therapy followed by Surgery Therapeutic Status: Preoperative or Nonsurgical Candidate (Clinical Staging) BRCA Mutation Status: Awaiting Test Results AJCC T Category: cTX AJCC 8 Stage Grouping: IVB AJCC N Category: cNX AJCC M Category: pM1b Therapy Plan: Neoadjuvant Therapy followed by Surgery Intent of Therapy: Non-Curative / Palliative Intent, Discussed with Patient

## 2020-07-03 ENCOUNTER — Encounter (HOSPITAL_COMMUNITY): Payer: Self-pay

## 2020-07-03 ENCOUNTER — Other Ambulatory Visit (HOSPITAL_COMMUNITY): Payer: Self-pay

## 2020-07-03 ENCOUNTER — Telehealth: Payer: Self-pay | Admitting: *Deleted

## 2020-07-03 ENCOUNTER — Other Ambulatory Visit: Payer: Self-pay | Admitting: Genetic Counselor

## 2020-07-03 ENCOUNTER — Encounter: Payer: Self-pay | Admitting: Genetic Counselor

## 2020-07-03 ENCOUNTER — Inpatient Hospital Stay: Payer: Medicare HMO | Attending: Genetic Counselor | Admitting: Genetic Counselor

## 2020-07-03 ENCOUNTER — Other Ambulatory Visit: Payer: Self-pay

## 2020-07-03 DIAGNOSIS — C786 Secondary malignant neoplasm of retroperitoneum and peritoneum: Secondary | ICD-10-CM | POA: Insufficient documentation

## 2020-07-03 DIAGNOSIS — Z7952 Long term (current) use of systemic steroids: Secondary | ICD-10-CM | POA: Insufficient documentation

## 2020-07-03 DIAGNOSIS — Z8042 Family history of malignant neoplasm of prostate: Secondary | ICD-10-CM | POA: Diagnosis not present

## 2020-07-03 DIAGNOSIS — C801 Malignant (primary) neoplasm, unspecified: Secondary | ICD-10-CM | POA: Diagnosis not present

## 2020-07-03 DIAGNOSIS — Z7982 Long term (current) use of aspirin: Secondary | ICD-10-CM | POA: Insufficient documentation

## 2020-07-03 DIAGNOSIS — Z87891 Personal history of nicotine dependence: Secondary | ICD-10-CM | POA: Insufficient documentation

## 2020-07-03 DIAGNOSIS — Z95828 Presence of other vascular implants and grafts: Secondary | ICD-10-CM

## 2020-07-03 DIAGNOSIS — C569 Malignant neoplasm of unspecified ovary: Secondary | ICD-10-CM | POA: Insufficient documentation

## 2020-07-03 DIAGNOSIS — R590 Localized enlarged lymph nodes: Secondary | ICD-10-CM | POA: Insufficient documentation

## 2020-07-03 DIAGNOSIS — I10 Essential (primary) hypertension: Secondary | ICD-10-CM | POA: Insufficient documentation

## 2020-07-03 DIAGNOSIS — Z79899 Other long term (current) drug therapy: Secondary | ICD-10-CM | POA: Insufficient documentation

## 2020-07-03 DIAGNOSIS — J449 Chronic obstructive pulmonary disease, unspecified: Secondary | ICD-10-CM | POA: Insufficient documentation

## 2020-07-03 DIAGNOSIS — E7800 Pure hypercholesterolemia, unspecified: Secondary | ICD-10-CM | POA: Insufficient documentation

## 2020-07-03 DIAGNOSIS — K219 Gastro-esophageal reflux disease without esophagitis: Secondary | ICD-10-CM | POA: Insufficient documentation

## 2020-07-03 HISTORY — DX: Presence of other vascular implants and grafts: Z95.828

## 2020-07-03 MED ORDER — LIDOCAINE-PRILOCAINE 2.5-2.5 % EX CREA: TOPICAL_CREAM | CUTANEOUS | 3 refills | Status: DC

## 2020-07-03 MED ORDER — PROCHLORPERAZINE MALEATE 10 MG PO TABS: 10.0000 mg | ORAL_TABLET | Freq: Four times a day (QID) | ORAL | 1 refills | Status: DC | PRN

## 2020-07-03 NOTE — Progress Notes (Signed)
REFERRING PROVIDER: Derek Jack, MD [address] [place],  [place] [address]  PRIMARY PROVIDER:  Ranae Plumber, PA  PRIMARY REASON FOR VISIT:  1. 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 Family history of prostate cancer    HISTORY OF PRESENT ILLNESS:   [name], a 64 y.o. female, was seen for a [place] cancer genetics consultation at the request of 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  [name] presents to clinic today with her boyfriend, Abe People, to discuss the possibility of a hereditary predisposition to cancer, genetic testing, and to further clarify her future cancer risks, as well as potential cancer risks for family members.   In 2021, at the age of 41, [name] was diagnosed with  of the peritoneal carcinomatosis and high-grade ovarian serous cancinoma. The treatment plan includes chemotherapy.   CANCER HISTORY:  Oncology History  Peritoneal carcinomatosis ([place])  06/06/2020 Initial Diagnosis   Peritoneal carcinomatosis ([place])   07/04/2020 -  Chemotherapy   The patient had palonosetron (ALOXI) injection 0.25 mg, 0.25 mg, Intravenous,  Once, 0 of 6 cycles pegfilgrastim-cbqv (UDENYCA) injection 6 mg, 6 mg, Subcutaneous, Once, 0 of 6 cycles CARBOplatin (PARAPLATIN) in sodium chloride 0.9 % 100 mL chemo infusion, , Intravenous,  Once, 0 of 6 cycles fosaprepitant (EMEND) 150 mg in sodium chloride 0.9 % 145 mL IVPB, 150 mg, Intravenous,  Once, 0 of 6 cycles PACLitaxel (TAXOL) 270 mg in sodium chloride 0.9 % 250 mL chemo infusion (> 40m/m2), 175 mg/m2, Intravenous,  Once, 0 of 6 cycles  for chemotherapy treatment.      RISK FACTORS:  Menarche was at age 64  First live birth at age 64  OCP use for approximately 2-3 years years.  Ovaries intact: yes.  Hysterectomy: no.  Menopausal status: postmenopausal; menopause at approximately 64 years old HRT use: 0 years. Colonoscopy: no; not examined. Mammogram within the last year: most recent mammogram in Feb 2020 Number of breast biopsies: 0. Up to date with pelvic exams: yes. Any excessive radiation exposure in the past: no  Past Medical History:  Diagnosis Date  . Anemia   . Asthma   . Cancer ([facility]    Gastric Cancer  . COPD (chronic obstructive pulmonary disease) ([place]   . High cholesterol   . Hypertension   . Neuropathy   . Pneumonia 2019  . Port-A-Cath in place 07/03/2020    Past Surgical History:  Procedure Laterality Date  . HALLUX VALGUS BASE WEDGE Right 06/09/2015   Procedure: Base wedge osteotomy with modified McBride right foot ;  Surgeon: TSharlotte Alamo MD;  Location: [facility] ORS;  Service: Podiatry;  Laterality: Right;  . PORTACATH PLACEMENT Left 06/28/2020   Procedure: PORT-A-CATHETER PLACEMENT LEFT CHEST (attached catheter in left subclavian);  Surgeon: BVirl Cagey MD;  Location: [facility]S;  Service: General;  Laterality: Left;    Social History   Socioeconomic History  . Marital status: Divorced    Spouse name: Not on file  . Number of children: 3  . Years of education: Not on file  . Highest education level: Not on file  Occupational History  . Occupation: DISABLED  Tobacco Use  . Smoking status: Former Smoker    Quit date: 11/04/2013    Years since quitting: 6.6  . Smokeless tobacco: Never Used  Vaping Use  . Vaping Use: Never used  Substance and Sexual  Activity  . Alcohol use: No  . Drug use: No  . Sexual activity: Not Currently  Other Topics Concern  . Not on file  Social History Narrative  . Not on file   Social Determinants of Health   Financial Resource Strain: Medium Risk  . Difficulty of Paying Living Expenses: Somewhat hard  Food Insecurity: No Food Insecurity  . Worried About Charity fundraiser in the Last Year: Never true  . Ran Out of Food in the Last Year: Never true  Transportation Needs: No Transportation Needs    . Lack of Transportation (Medical): No  . Lack of Transportation (Non-Medical): No  Physical Activity: Inactive  . Days of Exercise per Week: 0 days  . Minutes of Exercise per Session: 0 min  Stress: Stress Concern Present  . Feeling of Stress : To some extent  Social Connections: Moderately Isolated  . Frequency of Communication with Friends and Family: More than three times a week  . Frequency of Social Gatherings with Friends and Family: Never  . Attends Religious Services: More than 4 times per year  . Active Member of Clubs or Organizations: No  . Attends Archivist Meetings: Never  . Marital Status: Divorced     FAMILY HISTORY:  We obtained a detailed, 4-generation family history.  Significant diagnoses are listed below: Family History  Problem Relation Age of Onset  . Alzheimer's disease Mother   . COPD Father   . Emphysema Father   . Hypertension Father   . Healthy Sister   . Healthy Brother   . Alzheimer's disease Maternal Grandmother   . Healthy Sister   . Healthy Sister   . Healthy Sister   . Prostate cancer Other        paternal grandmother's brother; dx in early 53s  . Breast cancer Neg Hx      [name] has two sons and one daughter, all of whom do not have a cancer history.  [name] has one full brother, two full sisters, and two maternal half brother brothers.  None of her siblings, nieces, or nephews have cancer. Her mother passed away at age 19. No cancer history was reported in her maternal uncle, cousin, or grandparents. [name] father passed away at age 61.  No cancer history was reported in her paternal aunts, uncles, cousins, or grandparents.  Her paternal grandmother's brother was diagnosed with prostate cancer in his early 57s and passed away at age 33 or 71.  She is not sure whether his prostate cancer was metastatic.  [name] is unaware of previous family history of genetic testing for hereditary cancer risks. Patient's maternal  ancestors are of White/Caucasian descent, and paternal ancestors are of Native [place] and White/Caucasian descent. There is no reported Ashkenazi Jewish ancestry. There is no known consanguinity.  GENETIC COUNSELING ASSESSMENT: [name] is a 64 y.o. female with a personal history of peritoneal/ovarian cancer which is somewhat suggestive of a hereditary cancer syndrome and predisposition to cancer given her personal diagnosis. We, therefore, discussed and recommended the following at today's visit.   DISCUSSION: We discussed that 5 - 10% of cancer is hereditary, with most cases of hereditary ovarian cancer associated with mutations in BRCA1 and BRCA2.  There are other genes that can be associated with hereditary cancer syndromes.  Level of cancer risk and type of cancer risk are gene-specific.  We discussed that testing is beneficial for several reasons including  identifying whether potential treatment options such  as PARP inhibitors would be beneficial and understanding if other family members could be at risk for cancer and allowing them to undergo genetic testing.   We reviewed the characteristics, features and inheritance patterns of hereditary cancer syndromes. We also discussed genetic testing, including the appropriate family members to test, the process of testing, insurance coverage and turn-around-time for results. We discussed the implications of a negative, positive, carrier and/or variant of uncertain significant result. We recommended [name] pursue genetic testing for a panel that includes genes associated with ovarian cancer cancer. At this time, [name] is not eligible for paired somatic and germline testing through [facility] given that the laboratory does not accept biopsy samples for somatic testing.   [name]  was offered a common hereditary cancer panel and an expanded pan-cancer panel. [name] was informed of the benefits and limitations of each panel, including that expanded  pan-cancer panels contain several preliminary evidence genes that do not have clear management guidelines at this point in time.  We also discussed that as the number of genes included on a panel increases, the chances of variants of uncertain significance increases.   After considering the benefits and limitations of each gene panel, [name]  elected to have an expanded pan-cancer panel through [place].   The CancerNext-Expanded gene panel offered by [facility] and includes sequencing and rearrangement analysis for the following 77 genes: AIP, ALK, APC*, ATM*, AXIN2, BAP1, BARD1, BLM, BMPR1A, BRCA1*, BRCA2*, BRIP1*, CDC73, CDH1*, CDK4, CDKN1B, CDKN2A, CHEK2*, CTNNA1, DICER1, FANCC, FH, FLCN, GALNT12, KIF1B, LZTR1, MAX, MEN1, MET, MLH1*, MSH2*, MSH3, MSH6*, MUTYH*, NBN, NF1*, NF2, NTHL1, PALB2*, PHOX2B, PMS2*, POT1, PRKAR1A, PTCH1, PTEN*, RAD51C*, RAD51D*, RB1, RECQL, RET, SDHA, SDHAF2, SDHB, SDHC, SDHD, SMAD4, SMARCA4, SMARCB1, SMARCE1, STK11, SUFU, TMEM127, TP53*, TSC1, TSC2, VHL and XRCC2 (sequencing and deletion/duplication); EGFR, EGLN1, HOXB13, KIT, MITF, PDGFRA, POLD1, and POLE (sequencing only); EPCAM and GREM1 (deletion/duplication only). DNA and RNA analyses performed for * genes.  Based on [name] personal history of cancer, she meets medical criteria for genetic testing. Despite that she meets criteria, she may still have an out of pocket cost. We discussed that if her out of pocket cost for testing is over $100, the laboratory will call and confirm whether she wants to proceed with testing.  If the out of pocket cost of testing is less than $100 she will be billed by the genetic testing laboratory.  She was informed of [facility]'s Patient Assistance Program.  She was informed to place her household income on the signature card at the time of the blood draw if she interested in being considered for the Patient Assistance Program.   PLAN: After considering the risks, benefits, and limitations,  [name] provided informed consent to pursue genetic testing and the blood sample will be sent to [facility] for analysis of the [place] with RNA-insight. Ms. Tullo will have her blood drawn on July 05, 2020 when she is at the [facility] for other appointments. Results should be available within approximately 3 weeks' time, at which point they will be disclosed by telephone to Ms. Noori, as will any additional recommendations warranted by these results. Ms. Hanford will receive a summary of her genetic counseling visit and a copy of her results once available. This information will also be available in Epic.   Lastly, we encouraged Ms. Baity to remain in contact with cancer genetics annually so that we can continuously update the family history and  inform her of any changes in cancer genetics and testing that may be of benefit for this family.   Ms. Burkemper questions were answered to her satisfaction today. Our contact information was provided should additional questions or concerns arise. Thank you for the referral and allowing [place] to share in the care of your patient.   Eloise Picone M. Joette Catching, [email or web address] (P) [phone]  The patient was seen for a total of 40 minutes in face-to-face genetic counseling.  This patient was discussed with Drs. Magrinat, Lindi Adie and/or Burr Medico who agrees with the above.   _______________________________________________________________________ For Office Staff:  Number of people involved in session: 1 Was an Intern/ student involved with case: no

## 2020-07-03 NOTE — Telephone Encounter (Signed)
Scheduler from [facility] called and scheduled the patient for a new patient appt. Patient scheduled on 8/20 due to her schedule. 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 The scheduler at [facility] will contact the patient

## 2020-07-03 NOTE — Patient Instructions (Signed)
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  You will be treated in the clinic every 3 weeks with a combination of chemotherapy drugs.  Those are paclitaxel (Taxol) and carboplatin.  The intent of treatment is to control your disease, keep it from spreading further, and to alleviate any symptoms you may be having related to your cancer.  You will see the doctor regularly throughout treatment.  We will obtain blood work from you prior to every treatment and monitor your results to make sure it is safe to give your treatment. The doctor monitors your response to treatment by the way you are feeling, your blood work, and by obtaining scans periodically.  There will be wait times while you are here for treatment.  It will take about 30 minutes to 1 hour for your lab work to result.  Then there will be wait times while pharmacy mixes your medications.    Medications you will receive in the clinic prior to your chemotherapy medications:  Aloxi:  ALOXI is used in adults to help prevent the nausea and vomiting that happens with certain chemotherapy drugs.  Aloxi is a long acting medication, and will remain in your system for about 2 days.   Emend:  This is an anti-nausea medication that is used with Aloxi to help prevent nausea and vomiting caused by chemotherapy.  Dexamethasone:  This is a steroid given prior to chemotherapy to help prevent allergic reactions; it may also help prevent and control nausea and diarrhea.   Pepcid:  This medication is a histamine blocker that helps prevent and allergic reaction to your chemotherapy.   Benadryl:  This is a histamine blocker (different from the Pepcid) that helps prevent allergic/infusion reactions to your chemotherapy. This medication may cause dizziness/drowsiness.    Paclitaxel (Taxol)  About This Drug  Paclitaxel is a drug used to treat cancer. It is given in the vein (IV).   This will take 3 hours to infuse.  This first infusion will take longer because it is increased slowly to monitor for reactions.  The nurse will be in the room with you for the first 15 minutes of the first infusion.  Possible Side Effects  . Hair loss. Hair loss is often temporary, although with certain medicine, hair loss can sometimes be permanent. Hair loss may happen suddenly or gradually. If you lose hair, you may lose it from your head, face, armpits, pubic area, chest, and/or legs. You may also notice your hair getting thin.  . Swelling of your legs, ankles and/or feet (edema)  . Flushing  . Nausea and throwing up (vomiting)  . Loose bowel movements (diarrhea)  . Bone marrow depression. This is a decrease in the number of white blood cells, red blood cells, and platelets. This may raise your risk of infection, make you tired and weak (fatigue), and raise your risk of bleeding.  . Effects on the nerves are called peripheral neuropathy. You may feel numbness, tingling, or pain in your hands and feet. It may be hard for you to button your clothes, open jars, or walk as usual. The effect on the nerves may get worse with more doses of the drug. These effects get better in some people after the drug is stopped but it does not get better in all people.  . Changes in your liver function  . Bone, joint and muscle pain  . Abnormal EKG  . Allergic reaction: Allergic reactions,  including anaphylaxis are rare but may happen in some patients. Signs of allergic reaction to this drug may be swelling of the face, feeling like your tongue or throat are swelling, trouble breathing, rash, itching, fever, chills, feeling dizzy, and/or feeling that your heart is beating in a fast or not normal way. If this happens, do not take another dose of this drug. You should get urgent medical treatment.  . Infection  . Changes in your kidney function.  Note: Each of the side effects above was reported in 20%  or greater of patients treated with paclitaxel. Not all possible side effects are included above.   Warnings and Precautions  . Severe allergic reactions  . Severe bone marrow depression   Treating Side Effects  . To help with hair loss, wash with a mild shampoo and avoid washing your hair every day.  . Avoid rubbing your scalp, instead, pat your hair or scalp dry  . Avoid coloring your hair  . Limit your use of hair spray, electric curlers, blow dryers, and curling irons.  . If you are interested in getting a wig, talk to your nurse. You can also call the [facility] at 800-ACS-2345 to find out information about the "Look Good, Feel Better" program close to where you live. It is a free program where women getting chemotherapy can learn about wigs, turbans and scarves as well as makeup techniques and skin and nail care.  . Ask your doctor or nurse about medicines that are available to help stop or lessen diarrhea and/or nausea.  . To help with nausea and vomiting, eat small, frequent meals instead of three large meals a day. Choose foods and drinks that are at room temperature. Ask your nurse or doctor about other helpful tips and medicine that is available to help or stop lessen these symptoms.  . If you get diarrhea, eat low-fiber foods that are high in protein and calories and avoid foods that can irritate your digestive tracts or lead to cramping. Ask your nurse or doctor about medicine that can lessen or stop your diarrhea.  . Mouth care is very important. Your mouth care should consist of routine, gentle cleaning of your teeth or dentures and rinsing your mouth with a mixture of 1/2 teaspoon of salt in 8 ounces of water or  teaspoon of baking soda in 8 ounces of water. This should be done at least after each meal and at bedtime.  . If you have mouth sores, avoid mouthwash that has alcohol. Also avoid alcohol and smoking because they can bother your mouth and  throat.  . Drink plenty of fluids (a minimum of eight glasses per day is recommended).  . Take your temperature as your doctor or nurse tells you, and whenever you feel like you may have a fever.  . Talk to your doctor or nurse about precautions you can take to avoid infections and bleeding.  . Be careful when cooking, walking, and handling sharp objects and hot liquids.  Food and Drug Interactions  . There are no known interactions of paclitaxel with food.  . This drug may interact with other medicines. Tell your doctor and pharmacist about all the medicines and dietary supplements (vitamins, minerals, herbs and others) that you are taking at this time.  . The safety and use of dietary supplements and alternative diets are often not known. Using these might affect your cancer or interfere with your treatment. Until more is known, you should not use  dietary supplements or alternative diets without your cancer doctor's help.  When to Call the Doctor  Call your doctor or nurse if you have any of the following symptoms and/or any new or unusual symptoms:  . Fever of 100.4 F (38 C) or above  . Chills  . Redness, pain, warmth, or swelling at the IV site during the infusion  . Signs of allergic reaction: swelling of the face, feeling like your tongue or throat are swelling, trouble breathing, rash, itching, fever, chills, feeling dizzy, and/or feeling that your heart is beating in a fast or not normal way  . Feeling that your heart is beating in a fast or not normal way (palpitations)  . Weight gain of 5 pounds in one week (fluid retention)  . Decreased urine or very dark urine  . Signs of liver problems: dark urine, pale bowel movements, bad stomach pain, feeling very tired and weak, unusual  itching, or yellowing of the eyes or skin  . Heavy menstrual period that lasts longer than normal  . Easy bruising or bleeding  . Nausea that stops you from eating or drinking, and/or that  is not relieved by prescribed medicines.  . Loose bowel movements (diarrhea) more than 4 times a day or diarrhea with weakness or lightheadedness  . Pain in your mouth or throat that makes it hard to eat or drink  . Lasting loss of appetite or rapid weight loss of five pounds in a week  . Signs of peripheral neuropathy: numbness, tingling, or decreased feeling in fingers or toes; trouble walking or changes in the way you walk; or feeling clumsy when buttoning clothes, opening jars, or other routine activities  . Joint and muscle pain that is not relieved by prescribed medicines  . Extreme fatigue that interferes with normal activities  . While you are getting this drug, please tell your nurse right away if you have any pain, redness, or swelling at the site of the IV infusion.  . If you think you are pregnant.  Reproduction Warnings  . Pregnancy warning: This drug may have harmful effects on the unborn child, it is recommended that effective methods of birth control should be used during your cancer treatment. Let your doctor know right away if you think you may be pregnant.  . Breast feeding warning: Women should not breast feed during treatment because this drug could enter the breastmilk and cause harm to a breast feeding baby.   Carboplatin (Paraplatin, CBDCA)  About This Drug  Carboplatin is used to treat cancer. It is given in the vein through your port a cath.  It will take 30 minutes to infuse. You will receive this medication every 3 weeks.   Possible Side Effects  . Bone marrow suppression. This is a decrease in the number of white blood cells, red blood cells, and platelets. This may raise your risk of infection, make you tired and weak (fatigue), and raise your risk of bleeding.  . Nausea and vomiting (throwing up)  . Weakness  . Changes in your liver function  . Changes in your kidney function  . Electrolyte changes  . Pain  Note: Each of the side effects  above was reported in 20% or greater of patients treated with carboplatin. Not all possible side effects are included above.   Warnings and Precautions  . Severe bone marrow suppression  . Allergic reactions, including anaphylaxis are rare but may happen in some patients. Signs of allergic reaction to this drug  may be swelling of the face, feeling like your tongue or throat are swelling, trouble breathing, rash, itching, fever, chills, feeling dizzy, and/or feeling that your heart is beating in a fast or not normal way. If this happens, do not take another dose of this drug. You should get urgent medical treatment.  . Severe nausea and vomiting  . Effects on the nerves are called peripheral neuropathy. This risk is increased if you are over the age of 78 or if you have received other medicine with risk of peripheral neuropathy. You may feel numbness, tingling, or pain in your hands and feet. It may be hard for you to button your clothes, open jars, or walk as usual. The effect on the nerves may get worse with more doses of the drug. These effects get better in some people after the drug is stopped but it does not get better in all people.  Marland Kitchen Blurred vision, loss of vision or other changes in eyesight  . Decreased hearing  . Skin and tissue irritation including redness, pain, warmth, or swelling at the IV site if the drug leaks out of the vein and into nearby tissue.  . Severe changes in your kidney function, which can cause kidney failure  . Severe changes in your liver function, which can cause liver failure  Note: Some of the side effects above are very rare. If you have concerns and/or questions, please discuss them with your medical team.  Important Information  . This drug may be present in the saliva, tears, sweat, urine, stool, vomit, semen, and vaginal secretions. Talk to your doctor and/or your nurse about the necessary precautions to take during this time.  Treating Side  Effects  . Manage tiredness by pacing your activities for the day.  . Be sure to include periods of rest between energy-draining activities.  . To decrease the risk of infection, wash your hands regularly.  . Avoid close contact with people who have a cold, the flu, or other infections.  . Take your temperature as your doctor or nurse tells you, and whenever you feel like you may have a fever.  . To help decrease the risk of bleeding, use a soft toothbrush. Check with your nurse before using dental floss.  . Be very careful when using knives or tools.  . Use an electric shaver instead of a razor.  . Drink plenty of fluids (a minimum of eight glasses per day is recommended).  . If you throw up or have loose bowel movements, you should drink more fluids so that you do not become dehydrated (lack of water in the body from losing too much fluid).  . To help with nausea and vomiting, eat small, frequent meals instead of three large meals a day.  Choose foods and drinks that are at room temperature. Ask your nurse or doctor about other helpful tips and medicine that is available to help stop or lessen these symptoms.  . If you have numbness and tingling in your hands and feet, be careful when cooking, walking, and handling sharp objects and hot liquids.  Marland Kitchen Keeping your pain under control is important to your well-being. Please tell your doctor or nurse if you are experiencing pain.  Food and Drug Interactions  . There are no known interactions of carboplatin with food.  . This drug may interact with other medicines. Tell your doctor and pharmacist about all the prescription and over-the-counter medicines and dietary supplements (vitamins, minerals, herbs and others) that  you are taking at this time. Also, check with your doctor or pharmacist before starting any new prescription or over-the-counter medicines, or dietary supplements to make sure that there are no interactions.  When to Call  the Doctor  Call your doctor or nurse if you have any of these symptoms and/or any new or unusual symptoms:  . Fever of 100.4 F (38 C) or higher  . Chills  . Tiredness that interferes with your daily activities  . Feeling dizzy or lightheaded  . Easy bleeding or bruising  . Nausea that stops you from eating or drinking and/or is not relieved by prescribed medicines  . Throwing up/vomiting  . Blurred vision or other changes in eyesight  . Decrease in hearing or ringing in the ear  . Signs of allergic reaction: swelling of the face, feeling like your tongue or throat are swelling, trouble breathing, rash, itching, fever, chills, feeling dizzy, and/or feeling that your heart is beating in a fast or not normal way. If this happens, call 911 for emergency care.  . While you are getting this drug, please tell your nurse right away if you have any pain, redness, or swelling at the site of the IV infusion  . Signs of possible liver problems: dark urine, pale bowel movements, bad stomach pain, feeling very tired and weak, unusual itching, or yellowing of the eyes or skin  . Decreased urine, or very dark urine  . Numbness, tingling, or pain in your hands and feet  . Pain that does not go away or is not relieved by prescribed medicine  . If you think you may be pregnant  Reproduction Warnings  . Pregnancy warning: This drug may have harmful effects on the unborn baby. Women of child bearing potential should use effective methods of birth control during your cancer treatment. Let your doctor know right away if you think you may be pregnant.  . Breastfeeding warning: It is not known if this drug passes into breast milk. For this reason, women should not breastfeed during treatment because this drug could enter the breast milk and cause harm to a breastfeeding baby.  . Fertility warning: Human fertility studies have not been done with this drug. Talk with your doctor or nurse if you  plan to have children. Ask for information on sperm or egg banking.  SELF CARE ACTIVITIES WHILE RECEIVING CHEMOTHERAPY:  Hydration Increase your fluid intake 48 hours prior to treatment and drink at least 8 to 12 cups (64 ounces) of water/decaffeinated beverages per day after treatment. You can still have your cup of coffee or soda but these beverages do not count as part of your 8 to 12 cups that you need to drink daily. No alcohol intake.  Medications Continue taking your normal prescription medication as prescribed.  If you start any new herbal or new supplements please let us know first to make sure it is safe.  Mouth Care Have teeth cleaned professionally before starting treatment. Keep dentures and partial plates clean. Use soft toothbrush and do not use mouthwashes that contain alcohol. Biotene is a good mouthwash that is available at most pharmacies or may be ordered by calling [phone]. Use warm salt water gargles (1 teaspoon salt per 1 quart warm water) before and after meals and at bedtime. If you need dental work, please let the doctor know before you go for your appointment so that we can coordinate the best possible time for you in regards to your chemo regimen. You  need to also let your dentist know that you are actively taking chemo. We may need to do labs prior to your dental appointment.  Skin Care Always use sunscreen that has not expired and with SPF (Sun Protection Factor) of 50 or higher. Wear hats to protect your head from the sun. Remember to use sunscreen on your hands, ears, face, & feet.  Use good moisturizing lotions such as udder cream, eucerin, or even Vaseline. Some chemotherapies can cause dry skin, color changes in your skin and nails.    . Avoid long, hot showers or baths. . Use gentle, fragrance-free soaps and laundry detergent. . Use moisturizers, preferably creams or ointments rather than lotions because the thicker consistency is better at preventing skin  dehydration. Apply the cream or ointment within 15 minutes of showering. Reapply moisturizer at night, and moisturize your hands every time after you wash them.  Hair Loss (if your doctor says your hair will fall out)  . If your doctor says that your hair is likely to fall out, decide before you begin chemo whether you want to wear a wig. You may want to shop before treatment to match your hair color. . Hats, turbans, and scarves can also camouflage hair loss, although some people prefer to leave their heads uncovered. If you go bare-headed outdoors, be sure to use sunscreen on your scalp. . Cut your hair short. It eases the inconvenience of shedding lots of hair, but it also can reduce the emotional impact of watching your hair fall out. . Don't perm or color your hair during chemotherapy. Those chemical treatments are already damaging to hair and can enhance hair loss. Once your chemo treatments are done and your hair has grown back, it's OK to resume dyeing or perming hair.  With chemotherapy, hair loss is almost always temporary. But when it grows back, it may be a different color or texture. In older adults who still had hair color before chemotherapy, the new growth may be completely gray.  Often, new hair is very fine and soft.  Infection Prevention Please wash your hands for at least 30 seconds using warm soapy water. Handwashing is the #1 way to prevent the spread of germs. Stay away from sick people or people who are getting over a cold. If you develop respiratory systems such as green/yellow mucus production or productive cough or persistent cough let us know and we will see if you need an antibiotic. It is a good idea to keep a pair of gloves on when going into grocery stores/[employer] to decrease your risk of coming into contact with germs on the carts, etc. Carry alcohol hand gel with you at all times and use it frequently if out in public. If your temperature reaches 100.4 or higher please  call the clinic and let us know.  If it is after hours or on the weekend please go to the ER if your temperature is over 100.4.  Please have your own personal thermometer at home to use.    Sex and bodily fluids If you are going to have sex, a condom must be used to protect the person that isn't taking chemotherapy. Chemo can decrease your libido (sex drive). For a few days after chemotherapy, chemotherapy can be excreted through your bodily fluids.  When using the toilet please close the lid and flush the toilet twice.  Do this for a few day after you have had chemotherapy.   Effects of chemotherapy on your sex life  Some changes are simple and won't last long. They won't affect your sex life permanently.  Sometimes you may feel: . too tired . not strong enough to be very active . sick or sore  . not in the mood . anxious or low  Your anxiety might not seem related to sex. For example, you may be worried about the cancer and how your treatment is going. Or you may be worried about money, or about how you family are coping with your illness.  These things can cause stress, which can affect your interest in sex. It's important to talk to your partner about how you feel.  Remember - the changes to your sex life don't usually last long. There's usually no medical reason to stop having sex during chemo. The drugs won't have any long term physical effects on your performance or enjoyment of sex. Cancer can't be passed on to your partner during sex  Contraception It's important to use reliable contraception during treatment. Avoid getting pregnant while you or your partner are having chemotherapy. This is because the drugs may harm the baby. Sometimes chemotherapy drugs can leave a man or woman infertile.  This means you would not be able to have children in the future. You might want to talk to someone about permanent infertility. It can be very difficult to learn that you may no longer be able to have  children. Some people find counselling helpful. There might be ways to preserve your fertility, although this is easier for men than for women. You may want to speak to a fertility expert. You can talk about sperm banking or harvesting your eggs. You can also ask about other fertility options, such as donor eggs. If you have or have had breast cancer, your doctor might advise you not to take the contraceptive pill. This is because the hormones in it might affect the cancer. It is not known for sure whether or not chemotherapy drugs can be passed on through semen or secretions from the vagina. Because of this some doctors advise people to use a barrier method if you have sex during treatment. This applies to vaginal, anal or oral sex. Generally, doctors advise a barrier method only for the time you are actually having the treatment and for about a week after your treatment. Advice like this can be worrying, but this does not mean that you have to avoid being intimate with your partner. You can still have close contact with your partner and continue to enjoy sex.  Animals If you have cats or birds we just ask that you not change the litter or change the cage.  Please have someone else do this for you while you are on chemotherapy.   Food Safety During and After Cancer Treatment Food safety is important for people both during and after cancer treatment. Cancer and cancer treatments, such as chemotherapy, radiation therapy, and stem cell/bone marrow transplantation, often weaken the immune system. This makes it harder for your body to protect itself from foodborne illness, also called food poisoning. Foodborne illness is caused by eating food that contains harmful bacteria, parasites, or viruses.  Foods to avoid Some foods have a higher risk of becoming tainted with bacteria. These include: Marland Kitchen Unwashed fresh fruit and vegetables, especially leafy vegetables that can hide dirt and other contaminants . Raw  sprouts, such as alfalfa sprouts . Raw or undercooked beef, especially ground beef, or other raw or undercooked meat and poultry . Fatty, fried, or spicy  foods immediately before or after treatment.  These can sit heavy on your stomach and make you feel nauseous. . Raw or undercooked shellfish, such as oysters. . Sushi and sashimi, which often contain raw fish.  . Unpasteurized beverages, such as unpasteurized fruit juices, raw milk, raw yogurt, or cider . Undercooked eggs, such as soft boiled, over easy, and poached; raw, unpasteurized eggs; or foods made with raw egg, such as homemade raw cookie dough and homemade mayonnaise  Simple steps for food safety  Shop smart. . Do not buy food stored or displayed in an unclean area. . Do not buy bruised or damaged fruits or vegetables. . Do not buy cans that have cracks, dents, or bulges. . Pick up foods that can spoil at the end of your shopping trip and store them in a cooler on the way home.  Prepare and clean up foods carefully. . Rinse all fresh fruits and vegetables under running water, and dry them with a clean towel or paper towel. . Clean the top of cans before opening them. . After preparing food, wash your hands for 20 seconds with hot water and soap. Pay special attention to areas between fingers and under nails. . Clean your utensils and dishes with hot water and soap. Marland Kitchen Disinfect your kitchen and cutting boards using 1 teaspoon of liquid, unscented bleach mixed into 1 quart of water.    Dispose of old food. . Eat canned and packaged food before its expiration date (the "use by" or "best before" date). . Consume refrigerated leftovers within 3 to 4 days. After that time, throw out the food. Even if the food does not smell or look spoiled, it still may be unsafe. Some bacteria, such as Listeria, can grow even on foods stored in the refrigerator if they are kept for too long.  Take precautions when eating out. . At restaurants, avoid  buffets and salad bars where food sits out for a long time and comes in contact with many people. Food can become contaminated when someone with a virus, often a norovirus, or another "bug" handles it. . Put any leftover food in a "to-go" container yourself, rather than having the server do it. And, refrigerate leftovers as soon as you get home. . Choose restaurants that are clean and that are willing to prepare your food as you order it cooked.   AT HOME MEDICATIONS:                                                                                                                                                                Compazine/Prochlorperazine 10mg  tablet. Take 1 tablet every 6 hours as needed for nausea/vomiting. (This can make you sleepy)   EMLA cream. Apply a quarter size amount to port site  1 hour prior to chemo. Do not rub in. Cover with plastic wrap.    Diarrhea Sheet   If you are having loose stools/diarrhea, please purchase Imodium and begin taking as outlined:  At the first sign of poorly formed or loose stools you should begin taking Imodium (loperamide) 2 mg capsules.  Take two tablets (4mg ) followed by one tablet (2mg ) every 2 hours - DO NOT EXCEED 8 tablets in 24 hours.  If it is bedtime and you are having loose stools, take 2 tablets at bedtime, then 2 tablets every 4 hours until morning.   Always call the [facility] if you are having loose stools/diarrhea that you can't get under control.  Loose stools/diarrhea leads to dehydration (loss of water) in your body.  We have other options of trying to get the loose stools/diarrhea to stop but you must let us know!   Constipation Sheet  Colace - 100 mg capsules - take 2 capsules daily.  If this doesn't help then you can increase to 2 capsules twice daily.  Please call if the above does not work for you. Do not go more than 2 days without a bowel movement.  It is very important that you do not become constipated.  It will  make you feel sick to your stomach (nausea) and can cause abdominal pain and vomiting.  Nausea Sheet   Compazine/Prochlorperazine 10mg  tablet. Take 1 tablet every 6 hours as needed for nausea/vomiting (This can make you drowsy).  If you are having persistent nausea (nausea that does not stop) please call the [facility] and let us know the amount of nausea that you are experiencing.  If you begin to vomit, you need to call the [facility] and if it is the weekend and you have vomited more than one time and can't get it to stop-go to the Emergency Room.  Persistent nausea/vomiting can lead to dehydration (loss of fluid in your body) and will make you feel very weak and unwell. Ice chips, sips of clear liquids, foods that are at room temperature, crackers, and toast tend to be better tolerated.   SYMPTOMS TO REPORT AS SOON AS POSSIBLE AFTER TREATMENT:  FEVER GREATER THAN 100.4 F  CHILLS WITH OR WITHOUT FEVER  NAUSEA AND VOMITING THAT IS NOT CONTROLLED WITH YOUR NAUSEA MEDICATION  UNUSUAL SHORTNESS OF BREATH  UNUSUAL BRUISING OR BLEEDING  TENDERNESS IN MOUTH AND THROAT WITH OR WITHOUT PRESENCE OF ULCERS  URINARY PROBLEMS  BOWEL PROBLEMS  UNUSUAL RASH      Wear comfortable clothing and clothing appropriate for easy access to any Portacath or PICC line. Let us know if there is anything that we can do to make your therapy better!    What to do if you need assistance after hours or on the weekends: CALL [phone].  HOLD on the line, do not hang up.  You will hear multiple messages but at the end you will be connected with a nurse triage line.  They will contact the doctor if necessary.  Most of the time they will be able to assist you.  Do not call the hospital operator.      I have been informed and understand all of the instructions given to me and have received a copy. I have been instructed to call the clinic [phone] or my family physician as soon as possible for  continued medical care, if indicated. I do not have any more questions at this time but understand that I  may call the [facility] or the Patient Navigator at [phone] during office hours should I have questions or need assistance in obtaining follow-up care.

## 2020-07-04 ENCOUNTER — Inpatient Hospital Stay (HOSPITAL_COMMUNITY): Payer: Medicare HMO

## 2020-07-04 ENCOUNTER — Inpatient Hospital Stay (HOSPITAL_COMMUNITY): Payer: Medicare HMO | Admitting: General Practice

## 2020-07-04 DIAGNOSIS — C786 Secondary malignant neoplasm of retroperitoneum and peritoneum: Secondary | ICD-10-CM

## 2020-07-04 DIAGNOSIS — Z95828 Presence of other vascular implants and grafts: Secondary | ICD-10-CM

## 2020-07-04 NOTE — Progress Notes (Signed)

## 2020-07-04 NOTE — Progress Notes (Signed)
.   Pharmacist Chemotherapy Monitoring - Initial Assessment    Anticipated start date: 07/05/20   Regimen:  . Are orders appropriate based on the patient's diagnosis, regimen, and cycle? Yes . Does the plan date match the patient's scheduled date? Yes . Is the sequencing of drugs appropriate? Yes . Are the premedications appropriate for the patient's regimen? Yes . Prior Authorization for treatment is: Pending o If applicable, is the correct biosimilar selected based on the patient's insurance? not applicable  Organ Function and Labs: Marland Kitchen Are dose adjustments needed based on the patient's renal function, hepatic function, or hematologic function? No . Are appropriate labs ordered prior to the start of patient's treatment? Yes . Other organ system assessment, if indicated: N/A . The following baseline labs, if indicated, have been ordered: N/A  Dose Assessment: . Are the drug doses appropriate? Yes . Are the following correct: o Drug concentrations Yes o IV fluid compatible with drug Yes o Administration routes Yes o Timing of therapy Yes . If applicable, does the patient have documented access for treatment and/or plans for port-a-cath placement? yes . If applicable, have lifetime cumulative doses been properly documented and assessed? not applicable Lifetime Dose Tracking  No doses have been documented on this patient for the following tracked chemicals: Doxorubicin, Epirubicin, Idarubicin, Daunorubicin, Mitoxantrone, Bleomycin, Oxaliplatin, Carboplatin, Liposomal Doxorubicin  o   Toxicity Monitoring/Prevention: . The patient has the following take home antiemetics prescribed: Prochlorperazine . The patient has the following take home medications prescribed: N/A . Medication allergies and previous infusion related reactions, if applicable, have been reviewed and addressed. Yes . The patient's current medication list has been assessed for drug-drug interactions with their chemotherapy  regimen. no significant drug-drug interactions were identified on review.  Order Review: . Are the treatment plan orders signed? No . Is the patient scheduled to see a provider prior to their treatment? Yes  I verify that I have reviewed each item in the above checklist and answered each question accordingly.  Wynona Neat 07/04/2020 2:17 PM

## 2020-07-04 NOTE — Progress Notes (Signed)
[facility] Initial Psychosocial Assessment Clinical Social Work  Clinical Social Work contacted by phone to assess psychosocial, emotional, mental health, and spiritual needs of the patient.   Barriers to care/review of distress screen:  - Transportation:  Do you anticipate any problems getting to appointments?  Do you have someone who can help run errands for you if you need it?  No concerns, boyfriend will drive her as needed.  - Help at home:  What is your living situation (alone, family, other)?  If you are physically unable to care for yourself, who would you call on to help you?  Boyfriend is staying w her at this time - will stay w her as needed.  - Support system:  What does your support system look like?  Who would you call on if you needed some kind of practical help?  What if you needed someone to talk to for emotional support?  Has sister in [place], can help as needed.  Has daughter "right down the road."   - Finances:  Are you concerned about finances.  Considering returning to work?  If not, applying for disability?  Is on disability, finances are strained, "this has put me in a hole."  Mostly concerned about medical care costs.  Gets small amount of Food Stamps.  Has talked w DSS re Medicaid, aware that she can apply for Medicaid when she accumulates prescribed amount of debt.  Sister paid for biopsy.    What is your understanding of where you are with your cancer? Its cause?  Your treatment plan and what happens next? "One morning I woke up w a terrible stomach ache", went to ED, had CT scan, was then diagnosed w cancer.  "The pain was so bad and intense", but I am glad it was found.  Plan is for chemotherapy, serial scans, then surgery to remove tumors.  Is not aware of staging.  Has COPD, has to carry heavy concentrator everywhere.  Gets home oxygen from [place].  "Its really more of an inconvenience."  What are your worries for the future as you begin treatment for cancer?  ":Losing my  hair", but "now not so much."  "Different people telling me different stuff..."  "Im not scared now because I know what to expect."  Her best friend died of lung cancer and she watched that process.  Felt relieved by chemo education session.  Had questions about nutrition/sugar/what to eat.  Reassured by chemo ed process.    What are your hopes and priorities during your treatment? What is important to you? What are your goals for your care?  Likes to be around the house, "I am a home buddy."  [place]    CSW Summary:  Patient and family psychosocial functioning including strengths, limitations, and coping skills:  64 year old, newly diagnosed w gyn cancer.  States first symptom was severe abdominal pain, which led to ED visit.  Has support from boyfriend, also has daughter and sister available.  Was anxious about treatment but reassured by chemo ed, "now I know what to expect...", feels prepared.  Not working, on disability, most concerned about medical debt but is aware of process of applying for [place]icaid and will do so.  Was getting information from multiple sources, which was confusing and somewhat anxiety producing for her.  Appreciates clear information and support from [facility] team re cancer and its treatment.   CSW and patient discussed common feeling and emotions when  being diagnosed with cancer, and the importance of support during treatment. CSW informed patient of the support team and support services at [facility]. CSW provided contact information and encouraged patient to call with any questions or concerns.  Identifications of barriers to care:  No reliable internet at home, cannot participate in any virtual programming or visits.    Availability of community resources:  [place] Worker follow up needed: No. please reconsult as needed  Edwyna Shell, [place] Worker Phone:  [phone]

## 2020-07-05 ENCOUNTER — Inpatient Hospital Stay (HOSPITAL_COMMUNITY): Payer: Medicare HMO

## 2020-07-05 ENCOUNTER — Encounter (HOSPITAL_COMMUNITY): Payer: Self-pay

## 2020-07-05 ENCOUNTER — Other Ambulatory Visit: Payer: Self-pay

## 2020-07-05 ENCOUNTER — Encounter (HOSPITAL_COMMUNITY): Payer: Self-pay | Admitting: Hematology

## 2020-07-05 ENCOUNTER — Inpatient Hospital Stay (HOSPITAL_BASED_OUTPATIENT_CLINIC_OR_DEPARTMENT_OTHER): Payer: Medicare HMO | Admitting: Hematology

## 2020-07-05 VITALS — BP 134/71 | HR 86 | Temp 97.7°F | Resp 16

## 2020-07-05 DIAGNOSIS — C786 Secondary malignant neoplasm of retroperitoneum and peritoneum: Secondary | ICD-10-CM | POA: Diagnosis not present

## 2020-07-05 DIAGNOSIS — Z95828 Presence of other vascular implants and grafts: Secondary | ICD-10-CM

## 2020-07-05 DIAGNOSIS — C569 Malignant neoplasm of unspecified ovary: Secondary | ICD-10-CM | POA: Diagnosis not present

## 2020-07-05 DIAGNOSIS — C801 Malignant (primary) neoplasm, unspecified: Secondary | ICD-10-CM

## 2020-07-05 DIAGNOSIS — Z5111 Encounter for antineoplastic chemotherapy: Secondary | ICD-10-CM | POA: Diagnosis not present

## 2020-07-05 LAB — CBC WITH DIFFERENTIAL/PLATELET
Abs Immature Granulocytes: 0.02 10*3/uL (ref 0.00–0.07)
Basophils Absolute: 0.1 10*3/uL (ref 0.0–0.1)
Basophils Relative: 1 %
Eosinophils Absolute: 0.2 10*3/uL (ref 0.0–0.5)
Eosinophils Relative: 3 %
HCT: 36.2 % (ref 36.0–46.0)
Hemoglobin: 11.2 g/dL — ABNORMAL LOW (ref 12.0–15.0)
Immature Granulocytes: 0 %
Lymphocytes Relative: 14 %
Lymphs Abs: 1.1 10*3/uL (ref 0.7–4.0)
MCH: 27.7 pg (ref 26.0–34.0)
MCHC: 30.9 g/dL (ref 30.0–36.0)
MCV: 89.4 fL (ref 80.0–100.0)
Monocytes Absolute: 0.7 10*3/uL (ref 0.1–1.0)
Monocytes Relative: 9 %
Neutro Abs: 5.7 10*3/uL (ref 1.7–7.7)
Neutrophils Relative %: 73 %
Platelets: 372 10*3/uL (ref 150–400)
RBC: 4.05 MIL/uL (ref 3.87–5.11)
RDW: 13 % (ref 11.5–15.5)
WBC: 7.7 10*3/uL (ref 4.0–10.5)
nRBC: 0 % (ref 0.0–0.2)

## 2020-07-05 LAB — COMPREHENSIVE METABOLIC PANEL
ALT: 10 U/L (ref 0–44)
AST: 19 U/L (ref 15–41)
Albumin: 3.3 g/dL — ABNORMAL LOW (ref 3.5–5.0)
Alkaline Phosphatase: 90 U/L (ref 38–126)
Anion gap: 9 (ref 5–15)
BUN: 5 mg/dL — ABNORMAL LOW (ref 8–23)
CO2: 30 mmol/L (ref 22–32)
Calcium: 8.9 mg/dL (ref 8.9–10.3)
Chloride: 100 mmol/L (ref 98–111)
Creatinine, Ser: 0.49 mg/dL (ref 0.44–1.00)
GFR calc Af Amer: >60 mL/min (ref >60)
GFR calc non Af Amer: >60 mL/min (ref >60)
Glucose, Bld: 104 mg/dL — ABNORMAL HIGH (ref 70–99)
Potassium: 3.7 mmol/L (ref 3.5–5.1)
Sodium: 139 mmol/L (ref 135–145)
Total Bilirubin: 0.3 mg/dL (ref 0.3–1.2)
Total Protein: 6.1 g/dL — ABNORMAL LOW (ref 6.5–8.1)

## 2020-07-05 LAB — MAGNESIUM: Magnesium: 1.9 mg/dL (ref 1.7–2.4)

## 2020-07-05 MED ORDER — PALONOSETRON HCL INJECTION 0.25 MG/5ML
0.2500 mg | Freq: Once | INTRAVENOUS | Status: AC
  Administered 2020-07-05: 0.25 mg via INTRAVENOUS

## 2020-07-05 MED ORDER — SODIUM CHLORIDE 0.9 % IV SOLN
10.0000 mg | Freq: Once | INTRAVENOUS | Status: AC
  Administered 2020-07-05: 10 mg via INTRAVENOUS
  Filled 2020-07-05: qty 10

## 2020-07-05 MED ORDER — PALONOSETRON HCL INJECTION 0.25 MG/5ML
INTRAVENOUS | Status: AC
  Filled 2020-07-05: qty 5

## 2020-07-05 MED ORDER — SODIUM CHLORIDE 0.9 % IV SOLN
543.6000 mg | Freq: Once | INTRAVENOUS | Status: AC
  Administered 2020-07-05: 540 mg via INTRAVENOUS
  Filled 2020-07-05: qty 54

## 2020-07-05 MED ORDER — HEPARIN SOD (PORK) LOCK FLUSH 100 UNIT/ML IV SOLN
500.0000 [IU] | Freq: Once | INTRAVENOUS | Status: AC | PRN
  Administered 2020-07-05: 500 [IU]

## 2020-07-05 MED ORDER — SODIUM CHLORIDE 0.9% FLUSH
10.0000 mL | INTRAVENOUS | Status: DC | PRN
  Administered 2020-07-05: 10 mL

## 2020-07-05 MED ORDER — DIPHENHYDRAMINE HCL 50 MG/ML IJ SOLN
INTRAMUSCULAR | Status: AC
  Filled 2020-07-05: qty 1

## 2020-07-05 MED ORDER — SODIUM CHLORIDE 0.9 % IV SOLN: Freq: Once | INTRAVENOUS | Status: AC

## 2020-07-05 MED ORDER — SODIUM CHLORIDE 0.9 % IV SOLN
140.0000 mg/m2 | Freq: Once | INTRAVENOUS | Status: AC
  Administered 2020-07-05: 216 mg via INTRAVENOUS
  Filled 2020-07-05: qty 36

## 2020-07-05 MED ORDER — FAMOTIDINE IN NACL 20-0.9 MG/50ML-% IV SOLN
20.0000 mg | Freq: Once | INTRAVENOUS | Status: AC
  Administered 2020-07-05: 20 mg via INTRAVENOUS

## 2020-07-05 MED ORDER — FAMOTIDINE IN NACL 20-0.9 MG/50ML-% IV SOLN
INTRAVENOUS | Status: AC
  Filled 2020-07-05: qty 50

## 2020-07-05 MED ORDER — SODIUM CHLORIDE 0.9 % IV SOLN
150.0000 mg | Freq: Once | INTRAVENOUS | Status: AC
  Administered 2020-07-05: 150 mg via INTRAVENOUS
  Filled 2020-07-05: qty 150

## 2020-07-05 MED ORDER — DIPHENHYDRAMINE HCL 50 MG/ML IJ SOLN
50.0000 mg | Freq: Once | INTRAMUSCULAR | Status: AC
  Administered 2020-07-05: 50 mg via INTRAVENOUS

## 2020-07-05 NOTE — Patient Instructions (Signed)
[facility] at [facility] Discharge Instructions  Labs drawn from portacath today   Thank you for choosing  Cancer Center at [facility] to provide your oncology and hematology care.  To afford each patient quality time with our provider, please arrive at least 15 minutes before your scheduled appointment time.   If you have a lab appointment with the Cancer Center please come in thru the Main Entrance and check in at the main information desk.  You need to re-schedule your appointment should you arrive 10 or more minutes late.  We strive to give you quality time with our providers, and arriving late affects you and other patients whose appointments are after yours.  Also, if you no show three or more times for appointments you may be dismissed from the clinic at the providers discretion.     Again, thank you for choosing [facility].  Our hope is that these requests will decrease the amount of time that you wait before being seen by our physicians.       _____________________________________________________________  Should you have questions after your visit to [facility], please contact our office at [phone] and follow the prompts.  Our office hours are 8:00 a.m. and 4:30 p.m. Monday - Friday.  Please note that voicemails left after 4:00 p.m. may not be returned until the following business day.  We are closed weekends and major holidays.  You do have access to a nurse 24-7, just call the main number to the clinic [phone] and do not press any options, hold on the line and a nurse will answer the phone.    For prescription refill requests, have your pharmacy contact our office and allow 72 hours.    Due to Covid, you will need to wear a mask upon entering the hospital. If you do not have a mask, a mask will be given to you at the Main Entrance upon arrival. For doctor visits, patients may have 1 support person age 18  or older with them. For treatment visits, patients can not have anyone with them due to social distancing guidelines and our immunocompromised population.     

## 2020-07-05 NOTE — Progress Notes (Signed)
Labs reviewed with Dr Raliegh Ip.  Faythe Ghee to proceed with treatment.  Dose reducing by 20% because of neuropathy.    [name] tolerated treatment well today without incidence. Vital signs stable prior to discharge. Discharged ambulatory with husband.

## 2020-07-05 NOTE — Patient Instructions (Signed)
[place] at [facility] Discharge Instructions  You were seen today by [name]. He went over your recent results. You received your treatment today. Take Compazine for your nausea daily for 2 days after your chemo treatment. [name] or the nurse practitioner will see you back in 1 week for labs and follow up.   Thank you for choosing [place] at [facility] to provide your oncology and hematology care.  To afford each patient quality time with our provider, please arrive at least 15 minutes before your scheduled appointment time.   If you have a lab appointment with the [facility] please come in thru the Main Entrance and check in at the main information desk  You need to re-schedule your appointment should you arrive 10 or more minutes late.  We strive to give you quality time with our providers, and arriving late affects you and other patients whose appointments are after yours.  Also, if you no show three or more times for appointments you may be dismissed from the clinic at the providers discretion.     Again, thank you for choosing [facility].  Our hope is that these requests will decrease the amount of time that you wait before being seen by our physicians.       _____________________________________________________________  Should you have questions after your visit to [facility], please contact our office at [phone] between the hours of 8:00 a.m. and 4:30 p.m.  Voicemails left after 4:00 p.m. will not be returned until the following business day.  For prescription refill requests, have your pharmacy contact our office and allow 72 hours.    Cancer Center Support Programs:   > Cancer Support Group  2nd Tuesday of the month 1pm-2pm, Journey Room

## 2020-07-05 NOTE — Progress Notes (Signed)
Patient's genetic labs ([facility] kit) drawn from port today. Labs labeled and packaged appropriately for shipment.

## 2020-07-05 NOTE — Progress Notes (Signed)
07/05/20  Ok to proceed without completed PA today per Durene Cal.  Henreitta Leber, PharmD

## 2020-07-05 NOTE — Patient Instructions (Signed)
[facility] Discharge Instructions for Patients Receiving Chemotherapy  Today you received the following chemotherapy agents   To help prevent nausea and vomiting after your treatment, we encourage you to take your nausea medication   If you develop nausea and vomiting that is not controlled by your nausea medication, call the clinic.   BELOW ARE SYMPTOMS THAT SHOULD BE REPORTED IMMEDIATELY:  *FEVER GREATER THAN 100.5 F  *CHILLS WITH OR WITHOUT FEVER  NAUSEA AND VOMITING THAT IS NOT CONTROLLED WITH YOUR NAUSEA MEDICATION  *UNUSUAL SHORTNESS OF BREATH  *UNUSUAL BRUISING OR BLEEDING  TENDERNESS IN MOUTH AND THROAT WITH OR WITHOUT PRESENCE OF ULCERS  *URINARY PROBLEMS  *BOWEL PROBLEMS  UNUSUAL RASH Items with * indicate a potential emergency and should be followed up as soon as possible.  Feel free to call the clinic should you have any questions or concerns. The clinic phone number is [phone].  Please show the CHEMO ALERT CARD at check-in to the Emergency Department and triage nurse.   

## 2020-07-05 NOTE — Progress Notes (Signed)
Wheelchair offered at discharge.

## 2020-07-05 NOTE — Progress Notes (Signed)
Had to take some rest  [place]. [place], [place] [address]   CLINIC:  Medical Oncology/Hematology  PCP:  Ranae Plumber, [place] / [place] [address] [phone]   REASON FOR VISIT:  Follow-up for high-grade serous ovarian carcinoma  PRIOR THERAPY: None  NGS Results: Not done  CURRENT THERAPY: Carboplatin and paclitaxel every 3 weeks  BRIEF ONCOLOGIC HISTORY:  Oncology History  Peritoneal carcinomatosis ([place])  06/06/2020 Initial Diagnosis   Peritoneal carcinomatosis ([place])   07/05/2020 -  Chemotherapy   The patient had palonosetron (ALOXI) injection 0.25 mg, 0.25 mg, Intravenous,  Once, 0 of 6 cycles pegfilgrastim-cbqv (UDENYCA) injection 6 mg, 6 mg, Subcutaneous, Once, 0 of 6 cycles CARBOplatin (PARAPLATIN) in sodium chloride 0.9 % 100 mL chemo infusion, , Intravenous,  Once, 0 of 6 cycles fosaprepitant (EMEND) 150 mg in sodium chloride 0.9 % 145 mL IVPB, 150 mg, Intravenous,  Once, 0 of 6 cycles PACLitaxel (TAXOL) 270 mg in sodium chloride 0.9 % 250 mL chemo infusion (> 80mg /m2), 175 mg/m2, Intravenous,  Once, 0 of 6 cycles  for chemotherapy treatment.      CANCER STAGING: Cancer Staging No matching staging information was found for the patient.  INTERVAL HISTORY:  [name], a 64 y.o. female, returns for routine follow-up and consideration for first cycle of chemotherapy. [name] was last seen on 06/30/2020.  Due for initiating cycle #1 of carboplatin and paclitaxel today.   Today she is accompanied by her husband. Overall, she tells me she has been feeling pretty well. She reports having 1 episode of abdominal pain yesterday which lasted for 1 hour, and didn't have to take anything for the pain. Her breathing is okay as long as she is resting. She denies having any numbness or tingling with gabapentin TID.  Overall, she feels ready for first cycle of chemo today.    REVIEW OF SYSTEMS:  Review of Systems    Constitutional: Negative for appetite change and fatigue.  Gastrointestinal: Negative for abdominal pain.  Neurological: Negative for numbness.  All other systems reviewed and are negative.   PAST MEDICAL/SURGICAL HISTORY:  Past Medical History:  Diagnosis Date  . Anemia   . Asthma   . Cancer [facility])    Gastric Cancer  . COPD (chronic obstructive pulmonary disease) ([place])   . High cholesterol   . Hypertension   . Neuropathy   . Pneumonia 2019  . Port-A-Cath in place 07/03/2020   Past Surgical History:  Procedure Laterality Date  . HALLUX VALGUS BASE WEDGE Right 06/09/2015   Procedure: Base wedge osteotomy with modified McBride right foot ;  Surgeon: Sharlotte Alamo, MD;  Location: [facility] ORS;  Service: Podiatry;  Laterality: Right;  . PORTACATH PLACEMENT Left 06/28/2020   Procedure: PORT-A-CATHETER PLACEMENT LEFT CHEST (attached catheter in left subclavian);  Surgeon: Virl Cagey, MD;  Location: [facility];  Service: General;  Laterality: Left;    SOCIAL HISTORY:  Social History   Socioeconomic History  . Marital status: Divorced    Spouse name: Not on file  . Number of children: 3  . Years of education: Not on file  . Highest education level: Not on file  Occupational History  . Occupation: DISABLED  Tobacco Use  . Smoking status: Former Smoker    Quit date: 11/04/2013    Years since quitting: 6.6  . Smokeless tobacco: Never Used  Vaping Use  . Vaping Use: Never used  Substance and Sexual Activity  .  Alcohol use: No  . Drug use: No  . Sexual activity: Not Currently  Other Topics Concern  . Not on file  Social History Narrative  . Not on file   Social Determinants of Health   Financial Resource Strain: Medium Risk  . Difficulty of Paying Living Expenses: Somewhat hard  Food Insecurity: No Food Insecurity  . Worried About Charity fundraiser in the Last Year: Never true  . Ran Out of Food in the Last Year: Never true  Transportation Needs: No Transportation Needs   . Lack of Transportation (Medical): No  . Lack of Transportation (Non-Medical): No  Physical Activity: Inactive  . Days of Exercise per Week: 0 days  . Minutes of Exercise per Session: 0 min  Stress: Stress Concern Present  . Feeling of Stress : To some extent  Social Connections: Moderately Isolated  . Frequency of Communication with Friends and Family: More than three times a week  . Frequency of Social Gatherings with Friends and Family: Never  . Attends Religious Services: More than 4 times per year  . Active Member of Clubs or Organizations: No  . Attends Archivist Meetings: Never  . Marital Status: Divorced  Human resources officer Violence: Not At Risk  . Fear of Current or Ex-Partner: No  . Emotionally Abused: No  . Physically Abused: No  . Sexually Abused: No    FAMILY HISTORY:  Family History  Problem Relation Age of Onset  . Alzheimer's disease Mother   . COPD Father   . Emphysema Father   . Hypertension Father   . Healthy Sister   . Healthy Brother   . Alzheimer's disease Maternal Grandmother   . Healthy Sister   . Healthy Sister   . Healthy Sister   . Prostate cancer Other        paternal grandmother's brother; dx in early 44s  . Breast cancer Neg Hx     CURRENT MEDICATIONS:  Current Outpatient Medications  Medication Sig Dispense Refill  . albuterol (PROVENTIL HFA;VENTOLIN HFA) 108 (90 BASE) MCG/ACT inhaler Inhale 4-6 puffs into the lungs every 6 (six) hours as needed for wheezing or shortness of breath.     Marland Kitchen amLODipine (NORVASC) 5 MG tablet Take 5 mg by mouth at bedtime.     Marland Kitchen aspirin EC 81 MG tablet Take 81 mg by mouth daily.    . ferrous sulfate 325 (65 FE) MG tablet Take 325 mg by mouth 2 (two) times daily with a meal.     . Fluticasone-Umeclidin-Vilant (TRELEGY ELLIPTA) 100-62.5-25 MCG/INH AEPB Inhale into the lungs.    . gabapentin (NEURONTIN) 300 MG capsule Take 300 mg by mouth 3 (three) times daily.     Marland Kitchen ipratropium (ATROVENT HFA) 17  MCG/ACT inhaler Inhale 2 puffs into the lungs every 6 (six) hours as needed for wheezing.     Marland Kitchen ipratropium-albuterol (DUONEB) 0.5-2.5 (3) MG/3ML SOLN Take 0.5 mg by nebulization every 6 (six) hours as needed.     . lidocaine-prilocaine (EMLA) cream Apply a small amount to port a cath site and cover with plastic wrap 1 hour prior to chemotherapy appointments 30 g 3  . nortriptyline (PAMELOR) 25 MG capsule Take 25 mg by mouth at bedtime.    . OXYGEN Inhale 3 L into the lungs continuous.     . pantoprazole (PROTONIX) 40 MG tablet Take 1 tablet (40 mg total) by mouth daily. 30 tablet 2  . predniSONE (DELTASONE) 5 MG tablet Take 5 mg by  mouth daily with breakfast.    . simvastatin (ZOCOR) 40 MG tablet Take 40 mg by mouth at bedtime.    Marland Kitchen CARBOPLATIN IV Inject into the vein every 21 ( twenty-one) days.    Marland Kitchen PACLITAXEL IV Inject into the vein every 21 ( twenty-one) days.    . prochlorperazine (COMPAZINE) 10 MG tablet Take 1 tablet (10 mg total) by mouth every 6 (six) hours as needed (Nausea or vomiting). (Patient not taking: Reported on 07/05/2020) 30 tablet 1   No current facility-administered medications for this visit.    ALLERGIES:  No Known Allergies  PHYSICAL EXAM:  Performance status (ECOG): 1 - Symptomatic but completely ambulatory  There were no vitals filed for this visit. Wt Readings from Last 3 Encounters:  07/05/20 123 lb 3.2 oz (55.9 kg)  06/30/20 124 lb 3.2 oz (56.3 kg)  06/28/20 121 lb 14.6 oz (55.3 kg)   Physical Exam Vitals reviewed.  Constitutional:      Appearance: Normal appearance.  Cardiovascular:     Rate and Rhythm: Normal rate and regular rhythm.     Pulses: Normal pulses.     Heart sounds: Normal heart sounds.  Pulmonary:     Effort: Pulmonary effort is normal.     Breath sounds: Examination of the right-lower field reveals rhonchi. Examination of the left-lower field reveals rhonchi. Rhonchi present. No wheezing.  Chest:     Comments: Port-a-Cath in L  chest Abdominal:     Palpations: Abdomen is soft. There is no mass.     Tenderness: There is no abdominal tenderness.  Musculoskeletal:     Right lower leg: No edema.     Left lower leg: No edema.  Neurological:     General: No focal deficit present.     Mental Status: She is alert and oriented to person, place, and time.  Psychiatric:        Mood and Affect: Mood normal.        Behavior: Behavior normal.     LABORATORY DATA:  I have reviewed the labs as listed.  CBC Latest Ref Rng & Units 07/05/2020 06/13/2020 05/29/2020  WBC 4.0 - 10.5 K/uL 7.7 6.6 10.5  Hemoglobin 12.0 - 15.0 g/dL 11.2(L) 13.5 12.8  Hematocrit 36 - 46 % 36.2 43.8 41.3  Platelets 150 - 400 K/uL 372 381 400   CMP Latest Ref Rng & Units 07/05/2020 05/29/2020 05/17/2019  Glucose 70 - 99 mg/dL 104(H) 101(H) -  BUN 8 - 23 mg/dL 5(L) 8 -  Creatinine 0.44 - 1.00 mg/dL 0.49 0.47 0.60  Sodium 135 - 145 mmol/L 139 141 -  Potassium 3.5 - 5.1 mmol/L 3.7 4.1 -  Chloride 98 - 111 mmol/L 100 99 -  CO2 22 - 32 mmol/L 30 32 -  Calcium 8.9 - 10.3 mg/dL 8.9 9.4 -  Total Protein 6.5 - 8.1 g/dL 6.1(L) 6.6 -  Total Bilirubin 0.3 - 1.2 mg/dL 0.3 0.3 -  Alkaline Phos 38 - 126 U/L 90 85 -  AST 15 - 41 U/L 19 21 -  ALT 0 - 44 U/L 10 12 -    DIAGNOSTIC IMAGING:  I have independently reviewed the scans and discussed with the patient. CT Chest W Contrast  Result Date: 06/28/2020 CLINICAL DATA:  Peritoneal carcinomatosis of unknown primary. EXAM: CT CHEST WITH CONTRAST TECHNIQUE: Multidetector CT imaging of the chest was performed during intravenous contrast administration. CONTRAST:  44mL OMNIPAQUE IOHEXOL 300 MG/ML  SOLN COMPARISON:  10/08/2019 FINDINGS: Cardiovascular: The heart size  is normal. No substantial pericardial effusion. Coronary artery calcification is evident. Atherosclerotic calcification is noted in the wall of the thoracic aorta. Mediastinum/Nodes: 11 mm short axis low right paratracheal node identified on 49/2. 11 mm  short axis precarinal node associated. Subcarinal lymphadenopathy measures 14 mm short axis. Small lymph nodes are seen in the hilar regions bilaterally. The esophagus has normal imaging features. Tiny hiatal hernia evident. There is no axillary lymphadenopathy. Lungs/Pleura: Centrilobular and paraseptal emphysema evident. Architectural distortion and scarring noted in the upper lungs bilaterally. Basilar predominant bronchial wall thickening with bronchiectasis and small airway impaction is noted bilaterally, involving the lingula and both lower lobes. Consolidative airspace disease is seen in the peripheral left lower lobe the base. No substantial pleural effusion. Upper Abdomen: Small volume fluid noted adjacent to the liver and spleen. Mild lymphadenopathy noted around the esophagogastric junction. Musculoskeletal: No worrisome lytic or sclerotic osseous abnormality. IMPRESSION: 1. No findings to suggest primary malignancy in the thorax. 2. Mediastinal lymphadenopathy is suspicious for metastatic disease. 3. Basilar predominant bronchial wall thickening with bronchiectasis and small airway impaction. Imaging features compatible with sequelae of chronic atypical infection although in the appropriate clinical setting, aspiration could have this appearance. There is a small region of associated consolidative opacity in the left base which may be related to atelectasis or infection. 4. Small volume ascites, incompletely visualized. 5. Aortic Atherosclerosis (ICD10-I70.0) and Emphysema (ICD10-J43.9). Electronically Signed   By: Misty Stanley M.D.   On: 06/28/2020 09:19   CT BIOPSY  Result Date: 06/13/2020 CLINICAL DATA:  Omental caking and trace abdominal ascites. No known primary EXAM: CT GUIDED CORE BIOPSY OF OMENTAL MASS ANESTHESIA/SEDATION: Intravenous Fentanyl 78mcg and Versed 1mg  were administered as conscious sedation during continuous monitoring of the patient's level of consciousness and physiological /  cardiorespiratory status by the radiology RN, with a total moderate sedation time of 10 minutes. PROCEDURE: The procedure risks, benefits, and alternatives were explained to the patient. Questions regarding the procedure were encouraged and answered. The patient understands and consents to the procedure. Select axial scans through the mid abdomen obtained. Omental nodules were identified and an appropriate skin entry site was determined and marked. The operative field was prepped with chlorhexidinein a sterile fashion, and a sterile drape was applied covering the operative field. A sterile gown and sterile gloves were used for the procedure. Local anesthesia was provided with 1% Lidocaine. Under CT fluoroscopic guidance, a 17 gauge trocar needle was advanced to the margin of the lesion. Once needle tip position was confirmed, coaxial 18-gauge core biopsy samples were obtained, submitted in formalin to surgical pathology. The guide needle was removed. Postprocedure scans show no hemorrhage or other apparent complication. The patient tolerated the procedure well. COMPLICATIONS: None immediate FINDINGS: Extensive omental caking was identified. Representative disease in the anterior mid abdomen was localized and representative core biopsy samples obtained as above. IMPRESSION: Technically successful CT-guided core biopsy, omental mass. Electronically Signed   By: Lucrezia Europe M.D.   On: 06/13/2020 15:53   DG Chest Port 1 View  Result Date: 06/28/2020 CLINICAL DATA:  Port placement EXAM: PORTABLE CHEST 1 VIEW COMPARISON:  2019 FINDINGS: Left chest wall port with catheter tip near the cavoatrial junction. No pneumothorax. Chronic interstitial prominence. Scarring at the lung bases. No pleural effusion. Similar cardiomediastinal contours. IMPRESSION: Left chest wall port with catheter tip near the cavoatrial junction. No pneumothorax. Electronically Signed   By: Macy Mis M.D.   On: 06/28/2020 09:23   DG C-Arm  1-60  Min-No Report  Result Date: 06/28/2020 Fluoroscopy was utilized by the requesting physician.  No radiographic interpretation.     ASSESSMENT:  1. Peritoneal carcinomatosis: -Presentation to the ER with right lower quadrant abdominal pain on and off for 1 month. -CTAP on 05/29/2020 showed extensive nodularity throughout the omentum and upper peritoneal cavity. Both ovaries are prominent although no well-defined mass is noted.  Small volume ascites. -CA-125 is elevated at 2407. CEA was 2.4. -CT of the chest shows 11 mm right paratracheal lymph node and 11 mm short axis precarinal lymph node.  Subcarinal lymphadenopathy measuring 14 mm short axis.  This is suspicious for metastatic disease. 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  2. COPD: -Quit smoking 6 years ago. Smoked 2 packs/day for 25-30 years. -Has been on 3 L/min oxygen via nasal cannula for the last 5 years.  3. Family history: -Paternal grandmother with colon cancer.   PLAN:  1.  High-grade serous ovarian carcinoma: -We discussed chemotherapy schedule and side effects in detail. -I have reviewed her labs today which showed normal LFTs.  Albumin is low at 3.3.  Recommended high-protein diet.  CBC was normal. -We will proceed with cycle 1 of carboplatin and paclitaxel today.  I will dose reduce carboplatin to AUC 5 and paclitaxel by 20%. -She will come back in 1 week for toxicity assessment.  2.  Family history: -She will be referred for genetic testing.  3.  Peripheral neuropathy: -She has some neuropathy in her legs and takes gabapentin 300 mg 3 times daily. -We have dose reduced paclitaxel.  If there is any worsening we will have to switch to docetaxel.   Orders placed this encounter:  No orders of the defined types were placed in this  encounter.    Derek Jack, MD [place] [phone]   I, Milinda Antis, am acting as a scribe for Dr. Sanda Linger.  I, Derek Jack MD, have reviewed the above documentation for accuracy and completeness, and I agree with the above.

## 2020-07-05 NOTE — Progress Notes (Signed)
Patient has been assessed by [name] and her labs reviewed.  Okay to proceed with cycle 1 treatment today.

## 2020-07-05 NOTE — Progress Notes (Signed)
Patient has initiated treatment. Oncology Navigation complete. Care has been handed off to [facility] Coordinator.

## 2020-07-06 ENCOUNTER — Telehealth (HOSPITAL_COMMUNITY): Payer: Self-pay

## 2020-07-06 NOTE — Telephone Encounter (Signed)
24 hour follow up- called to check on patient, no answer at this time, left message for her to call if she had any questions or concerns.

## 2020-07-07 ENCOUNTER — Inpatient Hospital Stay (HOSPITAL_COMMUNITY): Payer: Medicare HMO

## 2020-07-07 ENCOUNTER — Encounter: Payer: Self-pay | Admitting: Oncology

## 2020-07-07 ENCOUNTER — Other Ambulatory Visit: Payer: Self-pay

## 2020-07-07 ENCOUNTER — Inpatient Hospital Stay (HOSPITAL_BASED_OUTPATIENT_CLINIC_OR_DEPARTMENT_OTHER): Payer: Medicare HMO | Admitting: Gynecologic Oncology

## 2020-07-07 ENCOUNTER — Encounter: Payer: Self-pay | Admitting: Gynecologic Oncology

## 2020-07-07 VITALS — BP 135/54 | HR 103 | Temp 97.3°F | Resp 20

## 2020-07-07 VITALS — BP 123/68 | HR 96 | Temp 97.8°F | Resp 22 | Ht 60.0 in | Wt 122.0 lb

## 2020-07-07 DIAGNOSIS — C786 Secondary malignant neoplasm of retroperitoneum and peritoneum: Secondary | ICD-10-CM

## 2020-07-07 DIAGNOSIS — E7800 Pure hypercholesterolemia, unspecified: Secondary | ICD-10-CM | POA: Diagnosis not present

## 2020-07-07 DIAGNOSIS — J449 Chronic obstructive pulmonary disease, unspecified: Secondary | ICD-10-CM | POA: Diagnosis not present

## 2020-07-07 DIAGNOSIS — Z5111 Encounter for antineoplastic chemotherapy: Secondary | ICD-10-CM | POA: Diagnosis not present

## 2020-07-07 DIAGNOSIS — Z7982 Long term (current) use of aspirin: Secondary | ICD-10-CM | POA: Diagnosis not present

## 2020-07-07 DIAGNOSIS — I10 Essential (primary) hypertension: Secondary | ICD-10-CM | POA: Diagnosis not present

## 2020-07-07 DIAGNOSIS — Z95828 Presence of other vascular implants and grafts: Secondary | ICD-10-CM

## 2020-07-07 DIAGNOSIS — K219 Gastro-esophageal reflux disease without esophagitis: Secondary | ICD-10-CM | POA: Diagnosis not present

## 2020-07-07 DIAGNOSIS — Z87891 Personal history of nicotine dependence: Secondary | ICD-10-CM | POA: Diagnosis not present

## 2020-07-07 DIAGNOSIS — Z79899 Other long term (current) drug therapy: Secondary | ICD-10-CM | POA: Diagnosis not present

## 2020-07-07 DIAGNOSIS — C569 Malignant neoplasm of unspecified ovary: Secondary | ICD-10-CM

## 2020-07-07 DIAGNOSIS — R590 Localized enlarged lymph nodes: Secondary | ICD-10-CM | POA: Diagnosis not present

## 2020-07-07 DIAGNOSIS — Z7952 Long term (current) use of systemic steroids: Secondary | ICD-10-CM | POA: Diagnosis not present

## 2020-07-07 MED ORDER — PEGFILGRASTIM-CBQV 6 MG/0.6ML ~~LOC~~ SOSY
6.0000 mg | PREFILLED_SYRINGE | Freq: Once | SUBCUTANEOUS | Status: AC
  Administered 2020-07-07: 6 mg via SUBCUTANEOUS

## 2020-07-07 MED ORDER — PEGFILGRASTIM-CBQV 6 MG/0.6ML ~~LOC~~ SOSY
PREFILLED_SYRINGE | SUBCUTANEOUS | Status: AC
  Filled 2020-07-07: qty 0.6

## 2020-07-07 NOTE — Patient Instructions (Signed)
This is a stage 4 ovarian cancer.  The treatment plan is 6 doses of chemotherapy and surgery (complete hysterectomy with removal of the tumor deposits in the upper abdomen). After the 1st 3 doses of chemotherapy we will have your scans repeated and have you back to see Dr Denman George. If you have had good response to the treatments, she will plan a surgery timed for 3 or 4 weeks from your 3rd dose of chemotherapy. After surgery you will need 3 more doses of chemotherapy.

## 2020-07-07 NOTE — Progress Notes (Signed)
Consult Note: Gyn-Onc  Consult was requested by [name] for the evaluation of [name] 64 y.o. female  CC:  Chief Complaint  Patient presents with  . Peritoneal carcinomatosis [facility])    Assessment/Plan:  [name]  is a 64 y.o.  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   Together with Butch Penny I reviewed her CT images to discuss and demonstrate the distribution of her disease.  I discussed the diagnosis, and the prognosis of this cancer.  I discussed that I believe she likely has stage 4 ovarian cancer. I discussed that the treatment approach for this disease is typically combination of cytoreductive surgery and chemotherapy. I discussed that sequencing of this can be either with upfront debulking followed by adjuvant chemotherapy sequentially or neoadjuvant chemotherapy followed by an interval cytoreductive attempt, then additional chemotherapy. This latter approach is associated with a reduced perioperative morbidity at the time of surgery.  I discussed that decisions regarding sequencing of therapy is individualized taking into account individual patient health, in addition to the apparent tumor distribution on imaging, and likelihood of complete surgical resection at the time of surgery. I discussed that the overall survival observed in patients is equivalent for both approaches (neoadjuvant chemotherapy versus primary debulking surgery) provided that there is an optimal cytoreductive effort at the time of surgery (regardless of the timing of that surgery). Given the observed decreased morbidity of neoadjuvant chemotherapy, with preserved survival outcomes, I favor this approach for her.   We will see her back after her 3rd cycle with repeat imaging and a plan for interval debulking surgery at that time if she has had a good response to chemotherapy. She understands that after surgery, additional chemotherapy would be necessary.  She will need pulmonology  evaluation/opimization prior to surgery.  She has already seen genetics for consultation. If she has a germline or somatic BRCA/HRD mutation, I would recommend PARP maintenance.   HPI: [name] is a 64 year old P2 who was seen in consultation at the request of Dr Delton Coombes for evaluation of stage IV ovarian high grade serous carcinoma.  The patient began experiencing abdominal discomfort with bloating and early satiety in May 2021.  She scheduled a problem visit with her primary care provider in May 2021 to discuss the symptoms.  This visit was a virtual visit due to Covid precautions.  At that visit her provider prescribed some medication which she thinks was for reflux however this failed to control the symptoms.  In July 2021 pain from the abdomen increased her abdominal pain became so significant that she required an ambulance visit to [facility] on May 29, 2020.  At [facility] a CT scan of the abdomen and pelvis was performed with contrast.  This revealed extensive nodularity throughout the omentum and upper peritoneal cavity highly suspicious for peritoneal carcinomatosis.  Both ovaries were prominent and an ovarian malignancy was suspected.  There were bulky base of mesentery and subhepatic space nodularity including measuring up to 5 cm.  There was scant ascites present.  Retroperitoneal lymph nodes were not enlarged but there were prominent mesenteric lymph nodes.  She was discharged from the emergency department after Ca125 on May 29, 2020 was elevated at 2407 (CEA was normal at 2.4 and CA 19-9 was also normal).  She was scheduled for a CT-guided biopsy of the omentum and this revealed high-grade adenocarcinoma with immunostains positive for p16, PAX eight, WT one, p53, CK7, and patchy  positivity for ER.  This morphology and immunophenotype were consistent with a high-grade gynecologic adenocarcinoma including high-grade serous.  She then saw [name] who set her up  for port placement, neoadjuvant chemotherapy with carboplatin and paclitaxel and genetics consultation.  CT chest was also ordered and performed on June 27, 2020.  This revealed emphysema and mediastinal lymphadenopathy suspicious for metastatic disease.  Evidence of bronchiectasis was also present.  First cycle of neoadjuvant carboplatin paclitaxel was commenced on July 05, 2020.  Her medical history is most significant for emphysema and COPD secondary to chronic tobacco abuse.  She requires home oxygen and has shortness of breath at rest.  She quit smoking in twenty fourteen.  Her surgical history is most significant for a tubal ligation, but she had no other prior surgeries on the abdomen.  Her gynecologic history is remarkable for two prior vaginal deliveries.  She has a history of an abnormal Pap smear in her 53s however this did not require surgical intervention and she reported normal Pap since that time including regular gynecologic care.  Her family cancer history is unremarkable for malignancy.  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  She worked with a machine that cut plastic with a burning mechanism. She lives with her boyfriend.   Current Meds:  Outpatient Encounter Medications as of 07/07/2020  Medication Sig  . albuterol (PROVENTIL HFA;VENTOLIN HFA) 108 (90 BASE) MCG/ACT inhaler Inhale 4-6 puffs into the lungs every 6 (six) hours as needed for wheezing or shortness of breath.   Marland Kitchen amLODipine (NORVASC) 5 MG tablet Take 5 mg by mouth at bedtime.   Marland Kitchen aspirin EC 81 MG tablet Take 81 mg by mouth daily.  Marland Kitchen CARBOPLATIN IV Inject into the vein every 21 ( twenty-one) days.  . ferrous sulfate 325 (65 FE) MG tablet Take 325 mg by mouth 2 (two) times daily with a meal.   . Fluticasone-Umeclidin-Vilant (TRELEGY ELLIPTA) 100-62.5-25 MCG/INH AEPB Inhale into the lungs.  . gabapentin (NEURONTIN) 300 MG capsule Take  300 mg by mouth 3 (three) times daily.   Marland Kitchen ipratropium (ATROVENT HFA) 17 MCG/ACT inhaler Inhale 2 puffs into the lungs every 6 (six) hours as needed for wheezing.   Marland Kitchen ipratropium-albuterol (DUONEB) 0.5-2.5 (3) MG/3ML SOLN Take 0.5 mg by nebulization every 6 (six) hours as needed.   . lidocaine-prilocaine (EMLA) cream Apply a small amount to port a cath site and cover with plastic wrap 1 hour prior to chemotherapy appointments  . nortriptyline (PAMELOR) 25 MG capsule Take 25 mg by mouth at bedtime.  . OXYGEN Inhale 3 L into the lungs continuous.   Marland Kitchen PACLITAXEL IV Inject into the vein every 21 ( twenty-one) days.  . pantoprazole (PROTONIX) 40 MG tablet Take 1 tablet (40 mg total) by mouth daily.  . predniSONE (DELTASONE) 5 MG tablet Take 5 mg by mouth daily with breakfast.  . prochlorperazine (COMPAZINE) 10 MG tablet Take 1 tablet (10 mg total) by mouth every 6 (six) hours as needed (Nausea or vomiting).  . simvastatin (ZOCOR) 40 MG tablet Take 40 mg by mouth at bedtime.  . [DISCONTINUED] HYDROcodone-acetaminophen (NORCO) 5-325 MG tablet Take 1 tablet by mouth every 4 (four) hours as needed for severe pain.   No facility-administered encounter medications on file as of 07/07/2020.    Allergy: No Known Allergies  Social Hx:   Social History   Socioeconomic History  . Marital status: Divorced  Spouse name: Not on file  . Number of children: 3  . Years of education: Not on file  . Highest education level: Not on file  Occupational History  . Occupation: DISABLED  Tobacco Use  . Smoking status: Former Smoker    Packs/day: 2.00    Years: 30.00    Pack years: 60.00    Types: Cigarettes    Quit date: 11/04/2013    Years since quitting: 6.6  . Smokeless tobacco: Never Used  Vaping Use  . Vaping Use: Never used  Substance and Sexual Activity  . Alcohol use: No  . Drug use: No  . Sexual activity: Not Currently  Other Topics Concern  . Not on file  Social History Narrative  . Not  on file   Social Determinants of Health   Financial Resource Strain: Medium Risk  . Difficulty of Paying Living Expenses: Somewhat hard  Food Insecurity: No Food Insecurity  . Worried About Charity fundraiser in the Last Year: Never true  . Ran Out of Food in the Last Year: Never true  Transportation Needs: No Transportation Needs  . Lack of Transportation (Medical): No  . Lack of Transportation (Non-Medical): No  Physical Activity: Inactive  . Days of Exercise per Week: 0 days  . Minutes of Exercise per Session: 0 min  Stress: Stress Concern Present  . Feeling of Stress : To some extent  Social Connections: Moderately Isolated  . Frequency of Communication with Friends and Family: More than three times a week  . Frequency of Social Gatherings with Friends and Family: Never  . Attends Religious Services: More than 4 times per year  . Active Member of Clubs or Organizations: No  . Attends Archivist Meetings: Never  . Marital Status: Divorced  Human resources officer Violence: Not At Risk  . Fear of Current or Ex-Partner: No  . Emotionally Abused: No  . Physically Abused: No  . Sexually Abused: No    Past Surgical Hx:  Past Surgical History:  Procedure Laterality Date  . HALLUX VALGUS BASE WEDGE Right 06/09/2015   Procedure: Base wedge osteotomy with modified McBride right foot ;  Surgeon: Sharlotte Alamo, MD;  Location: [facility] ORS;  Service: Podiatry;  Laterality: Right;  . PORTACATH PLACEMENT Left 06/28/2020   Procedure: PORT-A-CATHETER PLACEMENT LEFT CHEST (attached catheter in left subclavian);  Surgeon: Virl Cagey, MD;  Location: [facility] ORS;  Service: General;  Laterality: Left;    Past Medical Hx:  Past Medical History:  Diagnosis Date  . Anemia   . Asthma   . Cancer [facility])    Gastric Cancer  . COPD (chronic obstructive pulmonary disease) ([place])   . High cholesterol   . Hypertension   . Neuropathy   . Pneumonia 2019  . Port-A-Cath in place 07/03/2020    Past  Gynecological History:  See HPI No LMP recorded. Patient is postmenopausal.  Family Hx:  Family History  Problem Relation Age of Onset  . Alzheimer's disease Mother   . COPD Father   . Emphysema Father   . Hypertension Father   . Healthy Sister   . Healthy Brother   . Alzheimer's disease Maternal Grandmother   . Healthy Sister   . Healthy Sister   . Healthy Sister   . Prostate cancer Other        paternal grandmother's brother; dx in early 2s  . Breast cancer Neg Hx     Review of Systems:  Constitutional  Feels well,  ENT Normal appearing ears and nares bilaterally Skin/Breast  No rash, sores, jaundice, itching, dryness Cardiovascular  No chest pain, shortness of breath, or edema  Pulmonary  No cough or wheeze.  Gastro Intestinal  + abdominal distension, early satiety Genito Urinary  No frequency, urgency, dysuria,  Musculo Skeletal  No myalgia, arthralgia, joint swelling or pain  Neurologic  No weakness, numbness, change in gait,  Psychology  No depression, anxiety, insomnia.   Vitals:  Blood pressure 123/68, pulse 96, temperature 97.8 F (36.6 C), temperature source Oral, resp. rate (!) 22, height 5' (1.524 m), weight 122 lb (55.3 kg), SpO2 95 %.  Physical Exam: WD in NAD Neck  Supple NROM, without any enlargements.  Lymph Node Survey No cervical supraclavicular or inguinal adenopathy Cardiovascular  Pulse normal rate, regularity and rhythm. S1 and S2 normal.  Lungs  Clear to auscultation bilateraly, without wheezes/crackles/rhonchi. Good air movement.  Skin  No rash/lesions/breakdown  Psychiatry  Alert and oriented to person, place, and time  Abdomen  Normoactive bowel sounds, abdomen soft, non-tender and non-obese without evidence of hernia.  Back No CVA tenderness Genito Urinary  Vulva/vagina: Normal external female genitalia.  No lesions. No discharge or bleeding.  Bladder/urethra:  No lesions or masses, well supported bladder  Vagina:  normal  Cervix: Normal appearing, no lesions.  Uterus:  Small, mobile, no parametrial involvement or nodularity.  Adnexa: unable to discretely appreciate masses. Rectal  Good tone, no masses no cul de sac nodularity.  Extremities  No bilateral cyanosis, clubbing or edema.  60 minutes of total time was spent for this patient encounter, including preparation, face-to-face counseling with the patient and coordination of care, review of imaging (results and images), communication with the referring provider and documentation of the encounter.   Thereasa Solo, MD  07/07/2020, 11:24 AM

## 2020-07-07 NOTE — Progress Notes (Signed)
Met with [name] after her appointment with Dr. Denman George.  Went over plan of treatment and that I will call her with an appointment to see Dr. Denman George after her third cycle of chemotherapy and repeat imaging.  Provided her with [employer] and encouraged her to call with any questions.

## 2020-07-07 NOTE — Patient Instructions (Signed)
[facility]  Discharge Instructions:   _______________________________________________________________  Thank you for choosing [facility] to provide your oncology and hematology care.  To afford each patient quality time with our providers, please arrive at least 15 minutes before your scheduled appointment.  You need to re-schedule your appointment if you arrive 10 or more minutes late.  We strive to give you quality time with our providers, and arriving late affects you and other patients whose appointments are after yours.  Also, if you no show three or more times for appointments you may be dismissed from the clinic.  Again, thank you for choosing [facility]. Our hope is that these requests will allow you access to exceptional care and in a timely manner. _______________________________________________________________  If you have questions after your visit, please contact our office at [phone] between the hours of 8:30 a.m. and 5:00 p.m. Voicemails left after 4:30 p.m. will not be returned until the following business day. _______________________________________________________________  For prescription refill requests, have your pharmacy contact our office. _______________________________________________________________  Recommendations made by the consultant and any test results will be sent to your referring physician. _______________________________________________________________ 

## 2020-07-07 NOTE — Progress Notes (Signed)
[name] presents today for injection per MD orders. Udenyca 6mg  administered SQ in right Upper Arm. Administration without incident. Patient tolerated well.  Vitals stable and discharged home from clinic ambulatory. Follow up as scheduled.   Patient presented today for injecton. She stated she is more short of breath today. She is already on nasal cannula at 3 liters. She stated that she developed a cough yesterday, producing clear sputum, she is "hoarse" loosing her voice. No other complaints. Instructed her to make sure she lets [place] know if she gets any worse, any color change in her sputum or any fever. She see MD next Thursday.

## 2020-07-08 ENCOUNTER — Other Ambulatory Visit: Payer: Self-pay

## 2020-07-08 ENCOUNTER — Emergency Department (HOSPITAL_COMMUNITY): Payer: Medicare HMO

## 2020-07-08 ENCOUNTER — Encounter (HOSPITAL_COMMUNITY): Payer: Self-pay | Admitting: *Deleted

## 2020-07-08 ENCOUNTER — Emergency Department (HOSPITAL_COMMUNITY)
Admission: EM | Admit: 2020-07-08 | Discharge: 2020-07-08 | Disposition: A | Discharge status: Home / Self Care | Payer: Medicare HMO | Attending: Emergency Medicine | Admitting: Emergency Medicine

## 2020-07-08 DIAGNOSIS — J449 Chronic obstructive pulmonary disease, unspecified: Secondary | ICD-10-CM | POA: Diagnosis not present

## 2020-07-08 DIAGNOSIS — Z79899 Other long term (current) drug therapy: Secondary | ICD-10-CM | POA: Diagnosis not present

## 2020-07-08 DIAGNOSIS — R1030 Lower abdominal pain, unspecified: Secondary | ICD-10-CM | POA: Insufficient documentation

## 2020-07-08 DIAGNOSIS — I10 Essential (primary) hypertension: Secondary | ICD-10-CM | POA: Diagnosis not present

## 2020-07-08 DIAGNOSIS — Z87891 Personal history of nicotine dependence: Secondary | ICD-10-CM | POA: Diagnosis not present

## 2020-07-08 DIAGNOSIS — Z7982 Long term (current) use of aspirin: Secondary | ICD-10-CM | POA: Diagnosis not present

## 2020-07-08 DIAGNOSIS — J45909 Unspecified asthma, uncomplicated: Secondary | ICD-10-CM | POA: Diagnosis not present

## 2020-07-08 DIAGNOSIS — C569 Malignant neoplasm of unspecified ovary: Secondary | ICD-10-CM | POA: Diagnosis not present

## 2020-07-08 DIAGNOSIS — Z20822 Contact with and (suspected) exposure to covid-19: Secondary | ICD-10-CM | POA: Diagnosis not present

## 2020-07-08 DIAGNOSIS — R109 Unspecified abdominal pain: Secondary | ICD-10-CM

## 2020-07-08 LAB — COMPREHENSIVE METABOLIC PANEL
ALT: 14 U/L (ref 0–44)
AST: 23 U/L (ref 15–41)
Albumin: 3.2 g/dL — ABNORMAL LOW (ref 3.5–5.0)
Alkaline Phosphatase: 90 U/L (ref 38–126)
Anion gap: 8 (ref 5–15)
BUN: 37 mg/dL — ABNORMAL HIGH (ref 8–23)
CO2: 32 mmol/L (ref 22–32)
Calcium: 8.7 mg/dL — ABNORMAL LOW (ref 8.9–10.3)
Chloride: 95 mmol/L — ABNORMAL LOW (ref 98–111)
Creatinine, Ser: 0.45 mg/dL (ref 0.44–1.00)
GFR calc Af Amer: >60 mL/min (ref >60)
GFR calc non Af Amer: >60 mL/min (ref >60)
Glucose, Bld: 121 mg/dL — ABNORMAL HIGH (ref 70–99)
Potassium: 4.3 mmol/L (ref 3.5–5.1)
Sodium: 135 mmol/L (ref 135–145)
Total Bilirubin: 0.3 mg/dL (ref 0.3–1.2)
Total Protein: 5.9 g/dL — ABNORMAL LOW (ref 6.5–8.1)

## 2020-07-08 LAB — URINALYSIS, ROUTINE W REFLEX MICROSCOPIC
Bilirubin Urine: NEGATIVE
Glucose, UA: NEGATIVE mg/dL
Hgb urine dipstick: NEGATIVE
Ketones, ur: NEGATIVE mg/dL
Leukocytes,Ua: NEGATIVE
Nitrite: NEGATIVE
Protein, ur: NEGATIVE mg/dL
Specific Gravity, Urine: >1.046 — ABNORMAL HIGH (ref 1.005–1.030)
pH: 6 (ref 5.0–8.0)

## 2020-07-08 LAB — CBC WITH DIFFERENTIAL/PLATELET
Abs Immature Granulocytes: 3.15 10*3/uL — ABNORMAL HIGH (ref 0.00–0.07)
Band Neutrophils: 4 %
Basophils Absolute: 0 10*3/uL (ref 0.0–0.1)
Basophils Relative: 0 %
Eosinophils Absolute: 0.2 10*3/uL (ref 0.0–0.5)
Eosinophils Relative: 1 %
HCT: 28.5 % — ABNORMAL LOW (ref 36.0–46.0)
Hemoglobin: 8.9 g/dL — ABNORMAL LOW (ref 12.0–15.0)
Lymphocytes Relative: 2 %
Lymphs Abs: 0.4 10*3/uL — ABNORMAL LOW (ref 0.7–4.0)
MCH: 28.4 pg (ref 26.0–34.0)
MCHC: 31.2 g/dL (ref 30.0–36.0)
MCV: 91.1 fL (ref 80.0–100.0)
Monocytes Absolute: 0.6 10*3/uL (ref 0.1–1.0)
Monocytes Relative: 3 %
Myelocytes: 1 %
Neutro Abs: 19.8 10*3/uL — ABNORMAL HIGH (ref 1.7–7.7)
Neutrophils Relative %: 89 %
Platelets: 365 10*3/uL (ref 150–400)
RBC: 3.13 MIL/uL — ABNORMAL LOW (ref 3.87–5.11)
RDW: 12.9 % (ref 11.5–15.5)
WBC: 21.3 10*3/uL — ABNORMAL HIGH (ref 4.0–10.5)
nRBC: 0 % (ref 0.0–0.2)

## 2020-07-08 LAB — SARS CORONAVIRUS 2 BY RT PCR (HOSPITAL ORDER, PERFORMED IN ~~LOC~~ HOSPITAL LAB): SARS Coronavirus 2: NEGATIVE

## 2020-07-08 MED ORDER — OXYCODONE-ACETAMINOPHEN 5-325 MG PO TABS: 1.0000 | ORAL_TABLET | ORAL | 0 refills | Status: DC | PRN

## 2020-07-08 MED ORDER — IOHEXOL 9 MG/ML PO SOLN
ORAL | Status: AC
  Filled 2020-07-08: qty 1000

## 2020-07-08 MED ORDER — IOHEXOL 300 MG/ML  SOLN
75.0000 mL | Freq: Once | INTRAMUSCULAR | Status: AC | PRN
  Administered 2020-07-08: 75 mL via INTRAVENOUS

## 2020-07-08 MED ORDER — DOCUSATE SODIUM 100 MG PO CAPS: 100.0000 mg | ORAL_CAPSULE | Freq: Two times a day (BID) | ORAL | 0 refills | Status: DC | PRN

## 2020-07-08 MED ORDER — ONDANSETRON 4 MG PO TBDP: ORAL_TABLET | ORAL | 0 refills | Status: DC

## 2020-07-08 MED ORDER — LACTATED RINGERS IV BOLUS
500.0000 mL | Freq: Once | INTRAVENOUS | Status: AC
  Administered 2020-07-08: 500 mL via INTRAVENOUS

## 2020-07-08 MED ORDER — HYDROMORPHONE HCL 1 MG/ML IJ SOLN
1.0000 mg | Freq: Once | INTRAMUSCULAR | Status: AC
  Administered 2020-07-08: 1 mg via INTRAVENOUS
  Filled 2020-07-08: qty 1

## 2020-07-08 NOTE — ED Triage Notes (Signed)
Pt brought in by EMS due abdominal pain and SOB. Pt has hx of ovarian cancer and was dx in July 2021 and is currently receiving chemo. Pt reports pain lower abdomen. No vomiting only nausea. Pt is chronic O2 at 3 L via [place]

## 2020-07-08 NOTE — ED Notes (Signed)
ED Provider at bedside. 

## 2020-07-08 NOTE — Discharge Instructions (Addendum)
Follow up with your md next week.  Drink plenty of fluids °

## 2020-07-12 ENCOUNTER — Encounter: Payer: Self-pay | Admitting: Oncology

## 2020-07-12 NOTE — ED Provider Notes (Signed)
[facility] Provider Note   CSN: [id number] Arrival date & time: 07/08/20  1047     History Chief Complaint  Patient presents with  . Abdominal Pain    [name] is a 64 y.o. female.  HPI   64 year old female with abdominal pain.  Recently diagnosed with ovarian cancer.  She began having more significant abdominal pain in the past 1 to 2 days.  No acute urinary complaints.  No fevers or chills.  Nauseated, no vomiting.  No diarrhea.  History of COPD with chronic shortness of breath.  Past Medical History:  Diagnosis Date  . Anemia   . Asthma   . Cancer [facility])    Gastric Cancer  . COPD (chronic obstructive pulmonary disease) ([place])   . High cholesterol   . Hypertension   . Neuropathy   . Pneumonia 2019  . Port-A-Cath in place 07/03/2020    Patient Active Problem List   Diagnosis Date Noted  . Ovarian cancer ([place]) 07/07/2020  . Port-A-Cath in place 07/03/2020  . Family history of prostate cancer 07/03/2020  . Goals of care, counseling/discussion 07/01/2020  . Peritoneal carcinomatosis ([place]) 06/06/2020  . Pneumonia 09/13/2018  . HCAP (healthcare-associated pneumonia) 01/29/2018  . Acute on chronic respiratory failure ([place]) 01/22/2018  . CAP (community acquired pneumonia) 01/18/2016  . Anemia, iron deficiency 11/07/2015  . Chronic respiratory failure ([place]) 11/07/2015  . Acute on chronic respiratory failure with hypoxia ([place]) 11/07/2015  . Hyperlipidemia 11/07/2015  . COPD with acute exacerbation ([place]) 11/06/2015  . Hypertension 11/06/2015    Past Surgical History:  Procedure Laterality Date  . HALLUX VALGUS BASE WEDGE Right 06/09/2015   Procedure: Base wedge osteotomy with modified McBride right foot ;  Surgeon: Sharlotte Alamo, MD;  Location: [facility] ORS;  Service: Podiatry;  Laterality: Right;  . PORTACATH PLACEMENT Left 06/28/2020   Procedure: PORT-A-CATHETER PLACEMENT LEFT CHEST (attached catheter in left subclavian);  Surgeon: Virl Cagey,  MD;  Location: [facility] ORS;  Service: General;  Laterality: Left;     OB History   No obstetric history on file.     Family History  Problem Relation Age of Onset  . Alzheimer's disease Mother   . COPD Father   . Emphysema Father   . Hypertension Father   . Healthy Sister   . Healthy Brother   . Alzheimer's disease Maternal Grandmother   . Healthy Sister   . Healthy Sister   . Healthy Sister   . Prostate cancer Other        paternal grandmother's brother; dx in early 48s  . Breast cancer Neg Hx     Social History   Tobacco Use  . Smoking status: Former Smoker    Packs/day: 2.00    Years: 30.00    Pack years: 60.00    Types: Cigarettes    Quit date: 11/04/2013    Years since quitting: 6.6  . Smokeless tobacco: Never Used  Vaping Use  . Vaping Use: Never used  Substance Use Topics  . Alcohol use: No  . Drug use: No    Home Medications Prior to Admission medications   Medication Sig Start Date End Date Taking? Authorizing Provider  albuterol (PROVENTIL HFA;VENTOLIN HFA) 108 (90 BASE) MCG/ACT inhaler Inhale 4-6 puffs into the lungs every 6 (six) hours as needed for wheezing or shortness of breath.     [provider]  amLODipine (NORVASC) 5 MG tablet Take 5 mg by mouth at bedtime.  [provider]  aspirin EC 81 MG tablet Take 81 mg by mouth daily.    [provider]  CARBOPLATIN IV Inject into the vein every 21 ( twenty-one) days. 07/05/20   [provider]  docusate sodium (COLACE) 100 MG capsule Take 1 capsule (100 mg total) by mouth 2 (two) times daily as needed for mild constipation. 07/08/20   Virgel Manifold, MD  ferrous sulfate 325 (65 FE) MG tablet Take 325 mg by mouth 2 (two) times daily with a meal.     [provider]  Fluticasone-Umeclidin-Vilant (TRELEGY ELLIPTA) 100-62.5-25 MCG/INH AEPB Inhale into the lungs. 01/21/20   [provider]  gabapentin (NEURONTIN) 300 MG capsule Take 300 mg by mouth 3 (three)  times daily.     [provider]  ipratropium (ATROVENT HFA) 17 MCG/ACT inhaler Inhale 2 puffs into the lungs every 6 (six) hours as needed for wheezing.     [provider]  ipratropium-albuterol (DUONEB) 0.5-2.5 (3) MG/3ML SOLN Take 0.5 mg by nebulization every 6 (six) hours as needed.  08/03/12   [provider]  lidocaine-prilocaine (EMLA) cream Apply a small amount to port a cath site and cover with plastic wrap 1 hour prior to chemotherapy appointments 07/03/20   Derek Jack, MD  nortriptyline (PAMELOR) 25 MG capsule Take 25 mg by mouth at bedtime.    [provider]  ondansetron (ZOFRAN ODT) 4 MG disintegrating tablet 4mg  ODT q4 hours prn nausea/vomit 07/08/20   Milton Ferguson, MD  oxyCODONE-acetaminophen (PERCOCET/ROXICET) 5-325 MG tablet Take 1 tablet by mouth every 4 (four) hours as needed for severe pain. 07/08/20   Virgel Manifold, MD  OXYGEN Inhale 3 L into the lungs continuous.     [provider]  PACLITAXEL IV Inject into the vein every 21 ( twenty-one) days. 07/05/20   [provider]  pantoprazole (PROTONIX) 40 MG tablet Take 1 tablet (40 mg total) by mouth daily. 01/28/18   Gladstone Lighter, MD  predniSONE (DELTASONE) 5 MG tablet Take 5 mg by mouth daily with breakfast.    [provider]  prochlorperazine (COMPAZINE) 10 MG tablet Take 1 tablet (10 mg total) by mouth every 6 (six) hours as needed (Nausea or vomiting). 07/03/20   Derek Jack, MD  simvastatin (ZOCOR) 40 MG tablet Take 40 mg by mouth at bedtime.    [provider]    Allergies    Patient has no known allergies.  Review of Systems   Review of Systems All systems reviewed and negative, other than as noted in HPI.  Physical Exam Updated Vital Signs BP 114/71   Pulse (!) 109   Temp 97.8 F (36.6 C) (Oral)   Resp 13   Ht 5' (1.524 m)   Wt 55.3 kg   SpO2 95%   BMI 23.81 kg/m   Physical Exam Vitals and nursing note  reviewed.  Constitutional:      General: She is not in acute distress.    Appearance: She is well-developed.  HENT:     Head: Normocephalic and atraumatic.  Eyes:     General:        Right eye: No discharge.        Left eye: No discharge.     Conjunctiva/sclera: Conjunctivae normal.  Cardiovascular:     Rate and Rhythm: Normal rate and regular rhythm.     Heart sounds: Normal heart sounds. No murmur heard.  No friction rub. No gallop.   Pulmonary:  Effort: No respiratory distress.     Breath sounds: Wheezing present.  Abdominal:     General: There is no distension.     Palpations: Abdomen is soft.     Tenderness: There is abdominal tenderness.  Musculoskeletal:        General: No tenderness.     Cervical back: Neck supple.  Skin:    General: Skin is warm and dry.  Neurological:     Mental Status: She is alert.  Psychiatric:        Behavior: Behavior normal.        Thought Content: Thought content normal.     ED Results / Procedures / Treatments   Labs (all labs ordered are listed, but only abnormal results are displayed) Labs Reviewed  CBC WITH DIFFERENTIAL/PLATELET - Abnormal; Notable for the following components:      Result Value   WBC 21.3 (*)    RBC 3.13 (*)    Hemoglobin 8.9 (*)    HCT 28.5 (*)    Neutro Abs 19.8 (*)    Lymphs Abs 0.4 (*)    Abs Immature Granulocytes 3.15 (*)    All other components within normal limits  COMPREHENSIVE METABOLIC PANEL - Abnormal; Notable for the following components:   Chloride 95 (*)    Glucose, Bld 121 (*)    BUN 37 (*)    Calcium 8.7 (*)    Total Protein 5.9 (*)    Albumin 3.2 (*)    All other components within normal limits  URINALYSIS, ROUTINE W REFLEX MICROSCOPIC - Abnormal; Notable for the following components:   Specific Gravity, Urine >1.046 (*)    All other components within normal limits  SARS CORONAVIRUS 2 BY RT PCR (HOSPITAL ORDER, [facility])    EKG None  Radiology No  results found.   CT Chest W Contrast  Result Date: 06/28/2020 CLINICAL DATA:  Peritoneal carcinomatosis of unknown primary. EXAM: CT CHEST WITH CONTRAST TECHNIQUE: Multidetector CT imaging of the chest was performed during intravenous contrast administration. CONTRAST:  72mL OMNIPAQUE IOHEXOL 300 MG/ML  SOLN COMPARISON:  10/08/2019 FINDINGS: Cardiovascular: The heart size is normal. No substantial pericardial effusion. Coronary artery calcification is evident. Atherosclerotic calcification is noted in the wall of the thoracic aorta. Mediastinum/Nodes: 11 mm short axis low right paratracheal node identified on 49/2. 11 mm short axis precarinal node associated. Subcarinal lymphadenopathy measures 14 mm short axis. Small lymph nodes are seen in the hilar regions bilaterally. The esophagus has normal imaging features. Tiny hiatal hernia evident. There is no axillary lymphadenopathy. Lungs/Pleura: Centrilobular and paraseptal emphysema evident. Architectural distortion and scarring noted in the upper lungs bilaterally. Basilar predominant bronchial wall thickening with bronchiectasis and small airway impaction is noted bilaterally, involving the lingula and both lower lobes. Consolidative airspace disease is seen in the peripheral left lower lobe the base. No substantial pleural effusion. Upper Abdomen: Small volume fluid noted adjacent to the liver and spleen. Mild lymphadenopathy noted around the esophagogastric junction. Musculoskeletal: No worrisome lytic or sclerotic osseous abnormality. IMPRESSION: 1. No findings to suggest primary malignancy in the thorax. 2. Mediastinal lymphadenopathy is suspicious for metastatic disease. 3. Basilar predominant bronchial wall thickening with bronchiectasis and small airway impaction. Imaging features compatible with sequelae of chronic atypical infection although in the appropriate clinical setting, aspiration could have this appearance. There is a small region of associated  consolidative opacity in the left base which may be related to atelectasis or infection. 4. Small volume ascites,  incompletely visualized. 5. Aortic Atherosclerosis (ICD10-I70.0) and Emphysema (ICD10-J43.9). Electronically Signed   By: Misty Stanley M.D.   On: 06/28/2020 09:19   CT ABDOMEN PELVIS W CONTRAST  Result Date: 07/08/2020 CLINICAL DATA:  Lower abdominal pain and shortness of breath 2 days. History of metastatic ovarian carcinoma. EXAM: CT ABDOMEN AND PELVIS WITH CONTRAST TECHNIQUE: Multidetector CT imaging of the abdomen and pelvis was performed using the standard protocol following bolus administration of intravenous contrast. CONTRAST:  75 mL OMNIPAQUE IOHEXOL 300 MG/ML  SOLN COMPARISON:  CT abdomen and pelvis 05/30/2019. FINDINGS: Lower chest: Trace pleural effusions. Extensive emphysematous disease in the lung bases. Mild dependent atelectasis. Hepatobiliary: Likely subcapsular cyst along the posterior right hepatic lobe is unchanged. No focal liver lesion or intrahepatic biliary ductal dilatation. No evidence of cholecystitis or gallstones. Pancreas: Unremarkable. No pancreatic ductal dilatation or surrounding inflammatory changes. Spleen: Normal in size. A few calcifications are seen in the spleen. Adrenals/Urinary Tract: Adrenal glands are unremarkable. Kidneys are normal, without renal calculi, focal lesion, or hydronephrosis. Bladder is unremarkable. Stomach/Bowel: Small to moderate hiatal hernia contains fluid suggestive of esophageal dysmotility or gastroesophageal reflux. Walls of the distal esophagus are thickened. Appendix appears normal. No evidence of bowel wall thickening, distention, or inflammatory changes. Diverticulosis noted. Vascular/Lymphatic: Mildly prominent mesenteric lymph nodes are again seen and not notably changed. Extensive atherosclerosis again seen. Reproductive: Fibroid uterus again seen. The ovaries are difficult to discretely visualize. Other: Small volume of  abdominal and pelvic ascites is seen. Extensive omental tumor and a tumor implant along the medial wall of the gallbladder do not appear grossly changed. Musculoskeletal: No acute or focal bony abnormality. IMPRESSION: No acute abnormality within the abdomen or pelvis. Small to moderate hiatal hernia contains fluid which could be due to reflux or poor motility. Thickening of the walls of the distal esophagus worrisome for inflammatory change. Extensive omental tumor and small volume of abdominal and pelvic ascites do not appear markedly changed. Trace pleural effusions also noted. Diverticulosis without diverticulitis. Moderate to moderately large volume of stool throughout the colon is seen. Fibroid uterus. Aortic Atherosclerosis (ICD10-I70.0) and Emphysema (ICD10-J43.9). Electronically Signed   By: Inge Rise M.D.   On: 07/08/2020 15:08   CT BIOPSY  Result Date: 06/13/2020 CLINICAL DATA:  Omental caking and trace abdominal ascites. No known primary EXAM: CT GUIDED CORE BIOPSY OF OMENTAL MASS ANESTHESIA/SEDATION: Intravenous Fentanyl 27mcg and Versed 1mg  were administered as conscious sedation during continuous monitoring of the patient's level of consciousness and physiological / cardiorespiratory status by the radiology RN, with a total moderate sedation time of 10 minutes. PROCEDURE: The procedure risks, benefits, and alternatives were explained to the patient. Questions regarding the procedure were encouraged and answered. The patient understands and consents to the procedure. Select axial scans through the mid abdomen obtained. Omental nodules were identified and an appropriate skin entry site was determined and marked. The operative field was prepped with chlorhexidinein a sterile fashion, and a sterile drape was applied covering the operative field. A sterile gown and sterile gloves were used for the procedure. Local anesthesia was provided with 1% Lidocaine. Under CT fluoroscopic guidance, a 17  gauge trocar needle was advanced to the margin of the lesion. Once needle tip position was confirmed, coaxial 18-gauge core biopsy samples were obtained, submitted in formalin to surgical pathology. The guide needle was removed. Postprocedure scans show no hemorrhage or other apparent complication. The patient tolerated the procedure well. COMPLICATIONS: None immediate FINDINGS: Extensive omental caking was identified. Representative  disease in the anterior mid abdomen was localized and representative core biopsy samples obtained as above. IMPRESSION: Technically successful CT-guided core biopsy, omental mass. Electronically Signed   By: Lucrezia Europe M.D.   On: 06/13/2020 15:53   DG Chest Portable 1 View  Result Date: 07/08/2020 CLINICAL DATA:  Chest pain dyspnea and nausea. EXAM: PORTABLE CHEST 1 VIEW COMPARISON:  Single-view of the chest 06/28/2020. CT chest 06/27/2020. FINDINGS: Port-A-Cath is unchanged. The lungs are severely emphysematous. Bronchiectatic change and atelectasis in the left lung base are again seen. No pneumothorax or pleural effusion. Aortic atherosclerosis. Heart size is normal. No acute or focal bony abnormality. IMPRESSION: No acute disease. Aortic Atherosclerosis (ICD10-I70.0) and Emphysema (ICD10-J43.9). Electronically Signed   By: Inge Rise M.D.   On: 07/08/2020 13:36   DG Chest Port 1 View  Result Date: 06/28/2020 CLINICAL DATA:  Port placement EXAM: PORTABLE CHEST 1 VIEW COMPARISON:  2019 FINDINGS: Left chest wall port with catheter tip near the cavoatrial junction. No pneumothorax. Chronic interstitial prominence. Scarring at the lung bases. No pleural effusion. Similar cardiomediastinal contours. IMPRESSION: Left chest wall port with catheter tip near the cavoatrial junction. No pneumothorax. Electronically Signed   By: Macy Mis M.D.   On: 06/28/2020 09:23   DG C-Arm 1-60 Min-No Report  Result Date: 06/28/2020 Fluoroscopy was utilized by the requesting physician.   No radiographic interpretation.    Procedures Procedures (including critical care time)  Medications Ordered in ED Medications  HYDROmorphone (DILAUDID) injection 1 mg (1 mg Intravenous Given 07/08/20 1248)  iohexol (OMNIPAQUE) 300 MG/ML solution 75 mL (75 mLs Intravenous Contrast Given 07/08/20 1422)  lactated ringers bolus 500 mL (0 mLs Intravenous Stopped 07/08/20 1639)    ED Course  I have reviewed the triage vital signs and the nursing notes.  Pertinent labs & imaging results that were available during my care of the patient were reviewed by me and considered in my medical decision making (see chart for details).    MDM Rules/Calculators/A&P                          64 year old female with abdominal pain.  I suspect likely related to ovarian cancer/metastatic disease.  CT today is stable from recent.  She does have a leukocytosis of unclear significance.  She is afebrile.  More anemic.  This needs to be followed.  UA pending.  She is feeling better after some pain medicine here in the emergency room.  Plan to prescribe her some pain medicine until she can discuss further with oncology.  Stool softeners.  Daughter updated.    Final Clinical Impression(s) / ED Diagnoses Final diagnoses:  Abdominal pain, unspecified abdominal location    Rx / DC Orders ED Discharge Orders         Ordered    oxyCODONE-acetaminophen (PERCOCET/ROXICET) 5-325 MG tablet  Every 4 hours PRN        07/08/20 1602    docusate sodium (COLACE) 100 MG capsule  2 times daily PRN        07/08/20 1602    ondansetron (ZOFRAN ODT) 4 MG disintegrating tablet        07/08/20 1633           Virgel Manifold, MD 07/12/20 [phone]

## 2020-07-12 NOTE — Progress Notes (Signed)
Called Dr. Gust Brooms office with pulmonology and advised them that patient will need optimization and clearance for upcoming debulking surgery.  She has an appointment with Dr. Raul Del on 07/17/20.  Surgical clearance form faxed to [phone].

## 2020-07-13 ENCOUNTER — Inpatient Hospital Stay (HOSPITAL_COMMUNITY): Payer: Medicare HMO

## 2020-07-13 ENCOUNTER — Inpatient Hospital Stay (HOSPITAL_BASED_OUTPATIENT_CLINIC_OR_DEPARTMENT_OTHER): Payer: Medicare HMO | Admitting: Hematology

## 2020-07-13 ENCOUNTER — Telehealth (HOSPITAL_COMMUNITY): Payer: Self-pay | Admitting: Hematology

## 2020-07-13 ENCOUNTER — Other Ambulatory Visit: Payer: Self-pay

## 2020-07-13 VITALS — BP 100/61 | HR 89 | Temp 98.0°F | Resp 18 | Wt 119.2 lb

## 2020-07-13 DIAGNOSIS — C801 Malignant (primary) neoplasm, unspecified: Secondary | ICD-10-CM

## 2020-07-13 DIAGNOSIS — C786 Secondary malignant neoplasm of retroperitoneum and peritoneum: Secondary | ICD-10-CM | POA: Diagnosis not present

## 2020-07-13 DIAGNOSIS — C569 Malignant neoplasm of unspecified ovary: Secondary | ICD-10-CM | POA: Diagnosis not present

## 2020-07-13 DIAGNOSIS — R8290 Unspecified abnormal findings in urine: Secondary | ICD-10-CM

## 2020-07-13 DIAGNOSIS — Z5111 Encounter for antineoplastic chemotherapy: Secondary | ICD-10-CM | POA: Diagnosis not present

## 2020-07-13 DIAGNOSIS — Z8042 Family history of malignant neoplasm of prostate: Secondary | ICD-10-CM

## 2020-07-13 DIAGNOSIS — D649 Anemia, unspecified: Secondary | ICD-10-CM

## 2020-07-13 LAB — COMPREHENSIVE METABOLIC PANEL
ALT: 12 U/L (ref 0–44)
AST: 26 U/L (ref 15–41)
Albumin: 2.8 g/dL — ABNORMAL LOW (ref 3.5–5.0)
Alkaline Phosphatase: 100 U/L (ref 38–126)
Anion gap: 10 (ref 5–15)
BUN: 7 mg/dL — ABNORMAL LOW (ref 8–23)
CO2: 33 mmol/L — ABNORMAL HIGH (ref 22–32)
Calcium: 8 mg/dL — ABNORMAL LOW (ref 8.9–10.3)
Chloride: 87 mmol/L — ABNORMAL LOW (ref 98–111)
Creatinine, Ser: 0.5 mg/dL (ref 0.44–1.00)
GFR calc Af Amer: >60 mL/min (ref >60)
GFR calc non Af Amer: >60 mL/min (ref >60)
Glucose, Bld: 105 mg/dL — ABNORMAL HIGH (ref 70–99)
Potassium: 3.2 mmol/L — ABNORMAL LOW (ref 3.5–5.1)
Sodium: 130 mmol/L — ABNORMAL LOW (ref 135–145)
Total Bilirubin: 0.2 mg/dL — ABNORMAL LOW (ref 0.3–1.2)
Total Protein: 5.4 g/dL — ABNORMAL LOW (ref 6.5–8.1)

## 2020-07-13 LAB — URINALYSIS, ROUTINE W REFLEX MICROSCOPIC
Glucose, UA: NEGATIVE mg/dL
Hgb urine dipstick: NEGATIVE
Ketones, ur: 5 mg/dL — AB
Nitrite: POSITIVE — AB
Protein, ur: 30 mg/dL — AB
Specific Gravity, Urine: 1.023 (ref 1.005–1.030)
WBC, UA: >50 WBC/hpf — ABNORMAL HIGH (ref 0–5)
pH: 5 (ref 5.0–8.0)

## 2020-07-13 LAB — CBC WITH DIFFERENTIAL/PLATELET
Band Neutrophils: 9 %
Basophils Absolute: 0 10*3/uL (ref 0.0–0.1)
Basophils Relative: 0 %
Eosinophils Absolute: 0 10*3/uL (ref 0.0–0.5)
Eosinophils Relative: 0 %
HCT: 21.6 % — ABNORMAL LOW (ref 36.0–46.0)
Hemoglobin: 7 g/dL — ABNORMAL LOW (ref 12.0–15.0)
Lymphocytes Relative: 6 %
Lymphs Abs: 1.6 10*3/uL (ref 0.7–4.0)
MCH: 28.1 pg (ref 26.0–34.0)
MCHC: 32.4 g/dL (ref 30.0–36.0)
MCV: 86.7 fL (ref 80.0–100.0)
Metamyelocytes Relative: 2 %
Monocytes Absolute: 4.1 10*3/uL — ABNORMAL HIGH (ref 0.1–1.0)
Monocytes Relative: 15 %
Myelocytes: 5 %
Neutro Abs: 18.8 10*3/uL — ABNORMAL HIGH (ref 1.7–7.7)
Neutrophils Relative %: 60 %
Platelets: 327 10*3/uL (ref 150–400)
Promyelocytes Relative: 3 %
RBC: 2.49 MIL/uL — ABNORMAL LOW (ref 3.87–5.11)
RDW: 13.1 % (ref 11.5–15.5)
WBC: 27.3 10*3/uL — ABNORMAL HIGH (ref 4.0–10.5)
nRBC: 0.5 % — ABNORMAL HIGH (ref 0.0–0.2)

## 2020-07-13 LAB — ABO/RH: ABO/RH(D): O NEG

## 2020-07-13 LAB — PREPARE RBC (CROSSMATCH)

## 2020-07-13 MED ORDER — HEPARIN SOD (PORK) LOCK FLUSH 100 UNIT/ML IV SOLN
500.0000 [IU] | Freq: Every day | INTRAVENOUS | Status: AC | PRN
  Administered 2020-07-13: 500 [IU]

## 2020-07-13 MED ORDER — POTASSIUM CHLORIDE CRYS ER 20 MEQ PO TBCR
40.0000 meq | EXTENDED_RELEASE_TABLET | Freq: Once | ORAL | Status: AC
  Administered 2020-07-13: 40 meq via ORAL
  Filled 2020-07-13: qty 2

## 2020-07-13 MED ORDER — DIPHENHYDRAMINE HCL 25 MG PO CAPS
25.0000 mg | ORAL_CAPSULE | Freq: Once | ORAL | Status: AC
  Administered 2020-07-13: 25 mg via ORAL
  Filled 2020-07-13: qty 1

## 2020-07-13 MED ORDER — SODIUM CHLORIDE 0.9% FLUSH
10.0000 mL | INTRAVENOUS | Status: AC | PRN
  Administered 2020-07-13: 10 mL

## 2020-07-13 MED ORDER — ACETAMINOPHEN 325 MG PO TABS
650.0000 mg | ORAL_TABLET | Freq: Once | ORAL | Status: AC
  Administered 2020-07-13: 650 mg via ORAL
  Filled 2020-07-13: qty 2

## 2020-07-13 MED ORDER — CIPROFLOXACIN HCL 500 MG PO TABS: 500.0000 mg | ORAL_TABLET | Freq: Two times a day (BID) | ORAL | 0 refills | Status: DC

## 2020-07-13 MED ORDER — SODIUM CHLORIDE 0.9% IV SOLUTION
250.0000 mL | Freq: Once | INTRAVENOUS | Status: AC
  Administered 2020-07-13: 250 mL via INTRAVENOUS

## 2020-07-13 NOTE — Progress Notes (Signed)
Notified blood bank that 1 unit of PRBCs will be given today.  [name] tolerated 1 unit of PRBCs without incidence. Vital signs stable prior to discharge. Discharged vis wheelchair with husband.   [name] blood administration stopped at 1423 on 07/13/2020. Unit number [id number]. Flow sheet will not allow RN to stop the infusion and complete the blood administration. 315 mL total PRBCs given.

## 2020-07-13 NOTE — Progress Notes (Signed)
Patient has had a foul odor from her urine, orders received for urinalysis.  Patient's hemoglobin today is 7.0 and patient is symptomatic.  Orders received for 1 unit of blood today. Her potassium is 3.2 orders received for potassium 40 mEq by mouth times one dose today. Primary RN and pharmacy aware.

## 2020-07-13 NOTE — Progress Notes (Signed)
[name], [place] [address]   CLINIC:  Medical Oncology/Hematology  PCP:  Ranae Plumber, [place] / [place] [address] [phone]   REASON FOR VISIT:  Follow-up for high-grade serous ovarian carcinoma  PRIOR THERAPY: None  NGS Results: Not done  CURRENT THERAPY: Carboplatin and paclitaxel every 3 weeks  BRIEF ONCOLOGIC HISTORY:  Oncology History  Peritoneal carcinomatosis ([place])  06/06/2020 Initial Diagnosis   Peritoneal carcinomatosis ([place])   07/05/2020 -  Chemotherapy   The patient had palonosetron (ALOXI) injection 0.25 mg, 0.25 mg, Intravenous,  Once, 1 of 6 cycles Administration: 0.25 mg (07/05/2020) pegfilgrastim-cbqv (UDENYCA) injection 6 mg, 6 mg, Subcutaneous, Once, 1 of 6 cycles Administration: 6 mg (07/07/2020) CARBOplatin (PARAPLATIN) 540 mg in sodium chloride 0.9 % 250 mL chemo infusion, 540 mg (100 % of original dose 543.6 mg), Intravenous,  Once, 1 of 6 cycles Dose modification:   (original dose 543.6 mg, Cycle 1) Administration: 540 mg (07/05/2020) fosaprepitant (EMEND) 150 mg in sodium chloride 0.9 % 145 mL IVPB, 150 mg, Intravenous,  Once, 1 of 6 cycles Administration: 150 mg (07/05/2020) PACLitaxel (TAXOL) 216 mg in sodium chloride 0.9 % 250 mL chemo infusion (> 80mg /m2), 140 mg/m2 = 216 mg (80 % of original dose 175 mg/m2), Intravenous,  Once, 1 of 6 cycles Dose modification: 140 mg/m2 (80 % of original dose 175 mg/m2, Cycle 1, Reason: Other (see comments), Comment: neuropathy) Administration: 216 mg (07/05/2020)  for chemotherapy treatment.      CANCER STAGING: Cancer Staging No matching staging information was found for the patient.  INTERVAL HISTORY:  [name], a 64 y.o. female, returns for routine follow-up of her high-grade serous ovarian carcinoma. [name] was last seen on 07/05/2020.   Today she is accompanied by her husband. She complains of having right lower abdominal pain,  feeling nauseous, and smelly urine, but denies pain with urination. The abdominal pain occurred before having the nausea and vomiting. She reports having melena the Friday after her last chemo treatment and she is taking ASA 81 mg daily. She went to [facility] on 8/22 for tarry black stool and right abdominal pain and received oxycodone which helped the pain. She stopped having diarrhea on 8/21 and has been constipated since. Her numbness in her legs has improved, though she is having more tingling now; she is taking gabapentin. She is drinking water and Pedialyte daily. Her appetite is not good since everything tastes metallic and doesn't tolerate Ensure or protein shakes.  She has never received a blood transfusion before.    REVIEW OF SYSTEMS:  Review of Systems  Constitutional: Positive for appetite change (moderately decreased) and fatigue (depleted).  Respiratory: Positive for cough and shortness of breath (d/t COPD).   Gastrointestinal: Positive for abdominal pain (8/10 R lower abdominal pain), constipation, diarrhea, nausea and vomiting.  Genitourinary: Positive for difficulty urinating (strong urine odor). Negative for dysuria.   Neurological: Positive for headaches.  All other systems reviewed and are negative.   PAST MEDICAL/SURGICAL HISTORY:  Past Medical History:  Diagnosis Date  . Anemia   . Asthma   . Cancer [facility])    Gastric Cancer  . COPD (chronic obstructive pulmonary disease) ([facility])   . High cholesterol   . Hypertension   . Neuropathy   . Pneumonia 2019  . Port-A-Cath in place 07/03/2020   Past Surgical History:  Procedure Laterality Date  . HALLUX VALGUS BASE WEDGE Right 06/09/2015   Procedure: Base  wedge osteotomy with modified McBride right foot ;  Surgeon: Sharlotte Alamo, MD;  Location: [facility] ORS;  Service: Podiatry;  Laterality: Right;  . PORTACATH PLACEMENT Left 06/28/2020   Procedure: PORT-A-CATHETER PLACEMENT LEFT CHEST (attached catheter in left subclavian);  Surgeon:  Virl Cagey, MD;  Location: [facility];  Service: General;  Laterality: Left;    SOCIAL HISTORY:  Social History   Socioeconomic History  . Marital status: Divorced    Spouse name: Not on file  . Number of children: 3  . Years of education: Not on file  . Highest education level: Not on file  Occupational History  . Occupation: DISABLED  Tobacco Use  . Smoking status: Former Smoker    Packs/day: 2.00    Years: 30.00    Pack years: 60.00    Types: Cigarettes    Quit date: 11/04/2013    Years since quitting: 6.6  . Smokeless tobacco: Never Used  Vaping Use  . Vaping Use: Never used  Substance and Sexual Activity  . Alcohol use: No  . Drug use: No  . Sexual activity: Not Currently  Other Topics Concern  . Not on file  Social History Narrative  . Not on file   Social Determinants of Health   Financial Resource Strain: Medium Risk  . Difficulty of Paying Living Expenses: Somewhat hard  Food Insecurity: No Food Insecurity  . Worried About Charity fundraiser in the Last Year: Never true  . Ran Out of Food in the Last Year: Never true  Transportation Needs: No Transportation Needs  . Lack of Transportation (Medical): No  . Lack of Transportation (Non-Medical): No  Physical Activity: Inactive  . Days of Exercise per Week: 0 days  . Minutes of Exercise per Session: 0 min  Stress: Stress Concern Present  . Feeling of Stress : To some extent  Social Connections: Moderately Isolated  . Frequency of Communication with Friends and Family: More than three times a week  . Frequency of Social Gatherings with Friends and Family: Never  . Attends Religious Services: More than 4 times per year  . Active Member of Clubs or Organizations: No  . Attends Archivist Meetings: Never  . Marital Status: Divorced  Human resources officer Violence: Not At Risk  . Fear of Current or Ex-Partner: No  . Emotionally Abused: No  . Physically Abused: No  . Sexually Abused: No     FAMILY HISTORY:  Family History  Problem Relation Age of Onset  . Alzheimer's disease Mother   . COPD Father   . Emphysema Father   . Hypertension Father   . Healthy Sister   . Healthy Brother   . Alzheimer's disease Maternal Grandmother   . Healthy Sister   . Healthy Sister   . Healthy Sister   . Prostate cancer Other        paternal grandmother's brother; dx in early 75s  . Breast cancer Neg Hx     CURRENT MEDICATIONS:  Current Outpatient Medications  Medication Sig Dispense Refill  . albuterol (PROVENTIL HFA;VENTOLIN HFA) 108 (90 BASE) MCG/ACT inhaler Inhale 4-6 puffs into the lungs every 6 (six) hours as needed for wheezing or shortness of breath.     Marland Kitchen amLODipine (NORVASC) 5 MG tablet Take 5 mg by mouth at bedtime.     Marland Kitchen aspirin EC 81 MG tablet Take 81 mg by mouth daily.    Marland Kitchen CARBOPLATIN IV Inject into the vein every 21 ( twenty-one) days.    Mar[place] Kitchen  docusate sodium (COLACE) 100 MG capsule Take 1 capsule (100 mg total) by mouth 2 (two) times daily as needed for mild constipation. 60 capsule 0  . ferrous sulfate 325 (65 FE) MG tablet Take 325 mg by mouth 2 (two) times daily with a meal.     . Fluticasone-Umeclidin-Vilant (TRELEGY ELLIPTA) 100-62.5-25 MCG/INH AEPB Inhale into the lungs.    . gabapentin (NEURONTIN) 300 MG capsule Take 300 mg by mouth 3 (three) times daily.     Marland Kitchen ipratropium (ATROVENT HFA) 17 MCG/ACT inhaler Inhale 2 puffs into the lungs every 6 (six) hours as needed for wheezing.     Marland Kitchen ipratropium-albuterol (DUONEB) 0.5-2.5 (3) MG/3ML SOLN Take 0.5 mg by nebulization every 6 (six) hours as needed.     . lidocaine-prilocaine (EMLA) cream Apply a small amount to port a cath site and cover with plastic wrap 1 hour prior to chemotherapy appointments 30 g 3  . nortriptyline (PAMELOR) 25 MG capsule Take 25 mg by mouth at bedtime.    Marland Kitchen oxyCODONE-acetaminophen (PERCOCET/ROXICET) 5-325 MG tablet Take 1 tablet by mouth every 4 (four) hours as needed for severe pain. 30  tablet 0  . OXYGEN Inhale 3 L into the lungs continuous.     Marland Kitchen PACLITAXEL IV Inject into the vein every 21 ( twenty-one) days.    . pantoprazole (PROTONIX) 40 MG tablet Take 1 tablet (40 mg total) by mouth daily. 30 tablet 2  . predniSONE (DELTASONE) 5 MG tablet Take 5 mg by mouth daily with breakfast.    . simvastatin (ZOCOR) 40 MG tablet Take 40 mg by mouth at bedtime.    . ondansetron (ZOFRAN ODT) 4 MG disintegrating tablet 4mg  ODT q4 hours prn nausea/vomit (Patient not taking: Reported on 07/13/2020) 12 tablet 0  . prochlorperazine (COMPAZINE) 10 MG tablet Take 1 tablet (10 mg total) by mouth every 6 (six) hours as needed (Nausea or vomiting). (Patient not taking: Reported on 07/13/2020) 30 tablet 1   Current Facility-Administered Medications  Medication Dose Route Frequency Provider Last Rate Last Admin  . 0.9 %  sodium chloride infusion (Manually program via Guardrails IV Fluids)  250 mL Intravenous Once Derek Jack, MD      . acetaminophen (TYLENOL) tablet 650 mg  650 mg Oral Once Derek Jack, MD      . diphenhydrAMINE (BENADRYL) capsule 25 mg  25 mg Oral Once Derek Jack, MD      . heparin lock flush 100 unit/mL  500 Units Intracatheter Daily PRN Derek Jack, MD      . potassium chloride SA (KLOR-CON) CR tablet 40 mEq  40 mEq Oral Once Derek Jack, MD      . sodium chloride flush (NS) 0.9 % injection 10 mL  10 mL Intracatheter PRN Derek Jack, MD        ALLERGIES:  No Known Allergies  PHYSICAL EXAM:  Performance status (ECOG): 1 - Symptomatic but completely ambulatory  Vitals:   07/13/20 1035  BP: 112/62  Pulse: 100  Resp: 18  Temp: (!) 97.3 F (36.3 C)  SpO2: 100%   Wt Readings from Last 3 Encounters:  07/13/20 119 lb 3.2 oz (54.1 kg)  07/08/20 121 lb 14.6 oz (55.3 kg)  07/07/20 122 lb (55.3 kg)   Physical Exam Vitals reviewed.  Constitutional:      Appearance: Normal appearance.     Interventions: Nasal cannula  in place.  Cardiovascular:     Rate and Rhythm: Normal rate and regular rhythm.  Pulses: Normal pulses.     Heart sounds: Normal heart sounds.  Pulmonary:     Effort: Pulmonary effort is normal.     Breath sounds: Normal breath sounds.  Chest:     Comments: Port-a-Cath in L chest Abdominal:     Palpations: Abdomen is soft.     Tenderness: There is no abdominal tenderness.  Neurological:     General: No focal deficit present.     Mental Status: She is alert and oriented to person, place, and time.  Psychiatric:        Mood and Affect: Mood normal.        Behavior: Behavior normal.      LABORATORY DATA:  I have reviewed the labs as listed.  CBC Latest Ref Rng & Units 07/13/2020 07/08/2020 07/05/2020  WBC 4.0 - 10.5 K/uL 27.3(H) 21.3(H) 7.7  Hemoglobin 12.0 - 15.0 g/dL 7.0(L) 8.9(L) 11.2(L)  Hematocrit 36 - 46 % 21.6(L) 28.5(L) 36.2  Platelets 150 - 400 K/uL 327 365 372   CMP Latest Ref Rng & Units 07/13/2020 07/08/2020 07/05/2020  Glucose 70 - 99 mg/dL 105(H) 121(H) 104(H)  BUN 8 - 23 mg/dL 7(L) 37(H) 5(L)  Creatinine 0.44 - 1.00 mg/dL 0.50 0.45 0.49  Sodium 135 - 145 mmol/L 130(L) 135 139  Potassium 3.5 - 5.1 mmol/L 3.2(L) 4.3 3.7  Chloride 98 - 111 mmol/L 87(L) 95(L) 100  CO2 22 - 32 mmol/L 33(H) 32 30  Calcium 8.9 - 10.3 mg/dL 8.0(L) 8.7(L) 8.9  Total Protein 6.5 - 8.1 g/dL 5.4(L) 5.9(L) 6.1(L)  Total Bilirubin 0.3 - 1.2 mg/dL 0.2(L) 0.3 0.3  Alkaline Phos 38 - 126 U/L 100 90 90  AST 15 - 41 U/L 26 23 19   ALT 0 - 44 U/L 12 14 10     DIAGNOSTIC IMAGING:  I have independently reviewed the scans and discussed with the patient. CT Chest W Contrast  Result Date: 06/28/2020 CLINICAL DATA:  Peritoneal carcinomatosis of unknown primary. EXAM: CT CHEST WITH CONTRAST TECHNIQUE: Multidetector CT imaging of the chest was performed during intravenous contrast administration. CONTRAST:  20mL OMNIPAQUE IOHEXOL 300 MG/ML  SOLN COMPARISON:  10/08/2019 FINDINGS: Cardiovascular: The heart  size is normal. No substantial pericardial effusion. Coronary artery calcification is evident. Atherosclerotic calcification is noted in the wall of the thoracic aorta. Mediastinum/Nodes: 11 mm short axis low right paratracheal node identified on 49/2. 11 mm short axis precarinal node associated. Subcarinal lymphadenopathy measures 14 mm short axis. Small lymph nodes are seen in the hilar regions bilaterally. The esophagus has normal imaging features. Tiny hiatal hernia evident. There is no axillary lymphadenopathy. Lungs/Pleura: Centrilobular and paraseptal emphysema evident. Architectural distortion and scarring noted in the upper lungs bilaterally. Basilar predominant bronchial wall thickening with bronchiectasis and small airway impaction is noted bilaterally, involving the lingula and both lower lobes. Consolidative airspace disease is seen in the peripheral left lower lobe the base. No substantial pleural effusion. Upper Abdomen: Small volume fluid noted adjacent to the liver and spleen. Mild lymphadenopathy noted around the esophagogastric junction. Musculoskeletal: No worrisome lytic or sclerotic osseous abnormality. IMPRESSION: 1. No findings to suggest primary malignancy in the thorax. 2. Mediastinal lymphadenopathy is suspicious for metastatic disease. 3. Basilar predominant bronchial wall thickening with bronchiectasis and small airway impaction. Imaging features compatible with sequelae of chronic atypical infection although in the appropriate clinical setting, aspiration could have this appearance. There is a small region of associated consolidative opacity in the left base which may be related to atelectasis or infection.  4. Small volume ascites, incompletely visualized. 5. Aortic Atherosclerosis (ICD10-I70.0) and Emphysema (ICD10-J43.9). Electronically Signed   By: Misty Stanley M.D.   On: 06/28/2020 09:19   CT ABDOMEN PELVIS W CONTRAST  Result Date: 07/08/2020 CLINICAL DATA:  Lower abdominal  pain and shortness of breath 2 days. History of metastatic ovarian carcinoma. EXAM: CT ABDOMEN AND PELVIS WITH CONTRAST TECHNIQUE: Multidetector CT imaging of the abdomen and pelvis was performed using the standard protocol following bolus administration of intravenous contrast. CONTRAST:  75 mL OMNIPAQUE IOHEXOL 300 MG/ML  SOLN COMPARISON:  CT abdomen and pelvis 05/30/2019. FINDINGS: Lower chest: Trace pleural effusions. Extensive emphysematous disease in the lung bases. Mild dependent atelectasis. Hepatobiliary: Likely subcapsular cyst along the posterior right hepatic lobe is unchanged. No focal liver lesion or intrahepatic biliary ductal dilatation. No evidence of cholecystitis or gallstones. Pancreas: Unremarkable. No pancreatic ductal dilatation or surrounding inflammatory changes. Spleen: Normal in size. A few calcifications are seen in the spleen. Adrenals/Urinary Tract: Adrenal glands are unremarkable. Kidneys are normal, without renal calculi, focal lesion, or hydronephrosis. Bladder is unremarkable. Stomach/Bowel: Small to moderate hiatal hernia contains fluid suggestive of esophageal dysmotility or gastroesophageal reflux. Walls of the distal esophagus are thickened. Appendix appears normal. No evidence of bowel wall thickening, distention, or inflammatory changes. Diverticulosis noted. Vascular/Lymphatic: Mildly prominent mesenteric lymph nodes are again seen and not notably changed. Extensive atherosclerosis again seen. Reproductive: Fibroid uterus again seen. The ovaries are difficult to discretely visualize. Other: Small volume of abdominal and pelvic ascites is seen. Extensive omental tumor and a tumor implant along the medial wall of the gallbladder do not appear grossly changed. Musculoskeletal: No acute or focal bony abnormality. IMPRESSION: No acute abnormality within the abdomen or pelvis. Small to moderate hiatal hernia contains fluid which could be due to reflux or poor motility. Thickening  of the walls of the distal esophagus worrisome for inflammatory change. Extensive omental tumor and small volume of abdominal and pelvic ascites do not appear markedly changed. Trace pleural effusions also noted. Diverticulosis without diverticulitis. Moderate to moderately large volume of stool throughout the colon is seen. Fibroid uterus. Aortic Atherosclerosis (ICD10-I70.0) and Emphysema (ICD10-J43.9). Electronically Signed   By: Inge Rise M.D.   On: 07/08/2020 15:08   CT BIOPSY  Result Date: 06/13/2020 CLINICAL DATA:  Omental caking and trace abdominal ascites. No known primary EXAM: CT GUIDED CORE BIOPSY OF OMENTAL MASS ANESTHESIA/SEDATION: Intravenous Fentanyl 53mcg and Versed 1mg  were administered as conscious sedation during continuous monitoring of the patient's level of consciousness and physiological / cardiorespiratory status by the radiology RN, with a total moderate sedation time of 10 minutes. PROCEDURE: The procedure risks, benefits, and alternatives were explained to the patient. Questions regarding the procedure were encouraged and answered. The patient understands and consents to the procedure. Select axial scans through the mid abdomen obtained. Omental nodules were identified and an appropriate skin entry site was determined and marked. The operative field was prepped with chlorhexidinein a sterile fashion, and a sterile drape was applied covering the operative field. A sterile gown and sterile gloves were used for the procedure. Local anesthesia was provided with 1% Lidocaine. Under CT fluoroscopic guidance, a 17 gauge trocar needle was advanced to the margin of the lesion. Once needle tip position was confirmed, coaxial 18-gauge core biopsy samples were obtained, submitted in formalin to surgical pathology. The guide needle was removed. Postprocedure scans show no hemorrhage or other apparent complication. The patient tolerated the procedure well. COMPLICATIONS: None immediate  FINDINGS: Extensive  omental caking was identified. Representative disease in the anterior mid abdomen was localized and representative core biopsy samples obtained as above. IMPRESSION: Technically successful CT-guided core biopsy, omental mass. Electronically Signed   By: Lucrezia Europe M.D.   On: 06/13/2020 15:53   DG Chest Portable 1 View  Result Date: 07/08/2020 CLINICAL DATA:  Chest pain dyspnea and nausea. EXAM: PORTABLE CHEST 1 VIEW COMPARISON:  Single-view of the chest 06/28/2020. CT chest 06/27/2020. FINDINGS: Port-A-Cath is unchanged. The lungs are severely emphysematous. Bronchiectatic change and atelectasis in the left lung base are again seen. No pneumothorax or pleural effusion. Aortic atherosclerosis. Heart size is normal. No acute or focal bony abnormality. IMPRESSION: No acute disease. Aortic Atherosclerosis (ICD10-I70.0) and Emphysema (ICD10-J43.9). Electronically Signed   By: Inge Rise M.D.   On: 07/08/2020 13:36   DG Chest Port 1 View  Result Date: 06/28/2020 CLINICAL DATA:  Port placement EXAM: PORTABLE CHEST 1 VIEW COMPARISON:  2019 FINDINGS: Left chest wall port with catheter tip near the cavoatrial junction. No pneumothorax. Chronic interstitial prominence. Scarring at the lung bases. No pleural effusion. Similar cardiomediastinal contours. IMPRESSION: Left chest wall port with catheter tip near the cavoatrial junction. No pneumothorax. Electronically Signed   By: Macy Mis M.D.   On: 06/28/2020 09:23   DG C-Arm 1-60 Min-No Report  Result Date: 06/28/2020 Fluoroscopy was utilized by the requesting physician.  No radiographic interpretation.     ASSESSMENT:  1. Peritoneal carcinomatosis: -Presentation to the ER with right lower quadrant abdominal pain on and off for 1 month. -CTAP on 05/29/2020 showed extensive nodularity throughout the omentum and upper peritoneal cavity. Both ovaries are prominent although no well-defined mass is noted.Small volume  ascites. -CA-125 is elevated at 2407. CEA was 2.4. -CT of the chest shows 11 mm right paratracheal lymph node and 11 mm short axis precarinal lymph node. Subcarinal lymphadenopathy measuring 14 mm short axis. This is suspicious for metastatic disease. 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 -Cycle 1 of carboplatin and paclitaxel on 07/05/2020. -Evaluated by Dr. Denman George on 07/07/2020.  Reevaluation after 3 cycles.  2. COPD: -Quit smoking 6 years ago. Smoked 2 packs/day for 25-30 years. -Has been on3 L/min oxygen via nasal cannulafor the last 5 years.  3. Family history: -Paternal grandmother with colon cancer.   PLAN:  1.High-grade serous ovarian carcinoma: -She was evaluated by Dr. Denman George and was recommended chemotherapy for 3 cycles and reevaluation. -After last cycle of chemotherapy she felt tingling worse in the legs for 2 days.  She went to the ER on Saturday with right upper quadrant pain, nausea vomiting and diarrhea.  She also had 1 day of tarry stools. -The both symptoms improved in 2 days.  CTAP in the ER on 07/08/2020 shows diverticulosis without diverticulitis.  Extensive omental tumor and ascites stable.  No acute abnormality. -Reviewed labs today.  LFTs are grossly normal although albumin is 2.8.  White count is elevated likely from growth factor. -Hemoglobin is 7.  Will give 1 unit of PRBC. -She complained of suprapubic pain.  We have checked urinalysis.  Urine had many bacteria and WBC with positive nitrate. -We will start her on antibiotic.  2. Family history: -She was referred for genetic testing.  3.  Peripheral neuropathy: -Neuropathy in the legs and takes gabapentin 300 mg 3 times daily.  Paclitaxel was dose reduced.  4.  Hypokalemia: -Potassium today is 3.2.  Oral potassium 40 mEq was given.    Orders placed this encounter:  Orders Placed This Encounter  Procedures  . Urinalysis, Routine w reflex microscopic  . Care order/instruction  . Type and screen  . Prepare RBC (crossmatch)     Derek Jack, MD [place] [phone]   I, Milinda Antis, am acting as a scribe for Dr. Sanda Linger.  I, Derek Jack MD, have reviewed the above documentation for accuracy and completeness, and I agree with the above.

## 2020-07-13 NOTE — Telephone Encounter (Signed)
Spoke to pt regarding out pt assist grant. Explained what it covers etc. Pt will bring in her disability awards letter asap.

## 2020-07-13 NOTE — Progress Notes (Signed)
Pt received 40 meq potassium PO. Here for 1 unit of PRBCs.  Hemoglobin 7.0.

## 2020-07-13 NOTE — Patient Instructions (Signed)
[place] at [facility] Discharge Instructions  You were seen today by [name]. He went over your recent results. You received a blood transfusion today. [name] will see you back in 2 weeks for labs and follow up.   Thank you for choosing [place] at [facility] to provide your oncology and hematology care.  To afford each patient quality time with our provider, please arrive at least 15 minutes before your scheduled appointment time.   If you have a lab appointment with the [facility] please come in thru the Main Entrance and check in at the main information desk  You need to re-schedule your appointment should you arrive 10 or more minutes late.  We strive to give you quality time with our providers, and arriving late affects you and other patients whose appointments are after yours.  Also, if you no show three or more times for appointments you may be dismissed from the clinic at the providers discretion.     Again, thank you for choosing [facility].  Our hope is that these requests will decrease the amount of time that you wait before being seen by our physicians.       _____________________________________________________________  Should you have questions after your visit to [facility], please contact our office at [phone] between the hours of 8:00 a.m. and 4:30 p.m.  Voicemails left after 4:00 p.m. will not be returned until the following business day.  For prescription refill requests, have your pharmacy contact our office and allow 72 hours.    Cancer Center Support Programs:   > Cancer Support Group  2nd Tuesday of the month 1pm-2pm, Journey Room

## 2020-07-14 ENCOUNTER — Encounter (HOSPITAL_COMMUNITY): Payer: Self-pay

## 2020-07-14 LAB — BPAM RBC
Blood Product Expiration Date: 202109242359
ISSUE DATE / TIME: 202108261239
Unit Type and Rh: 9500

## 2020-07-14 LAB — TYPE AND SCREEN
ABO/RH(D): O NEG
Antibody Screen: NEGATIVE
Unit division: 0

## 2020-07-14 NOTE — Progress Notes (Signed)
I have called the patient regarding Cipro prescription sent by [name]. Patient verbalized understanding of medication regimen.

## 2020-07-25 ENCOUNTER — Telehealth: Payer: Self-pay | Admitting: Genetic Counselor

## 2020-07-25 NOTE — Telephone Encounter (Signed)
Contacted patient in attempt to disclose results of genetic testing.  LVM with contact information requesting a call back.  

## 2020-07-26 ENCOUNTER — Inpatient Hospital Stay (HOSPITAL_COMMUNITY): Payer: Medicare HMO

## 2020-07-26 ENCOUNTER — Inpatient Hospital Stay (HOSPITAL_COMMUNITY): Payer: Medicare HMO | Attending: Hematology | Admitting: Hematology

## 2020-07-26 ENCOUNTER — Other Ambulatory Visit: Payer: Self-pay

## 2020-07-26 VITALS — BP 99/62 | HR 94 | Temp 96.9°F | Resp 18 | Wt 119.6 lb

## 2020-07-26 VITALS — BP 116/54 | HR 99 | Temp 96.9°F | Resp 18

## 2020-07-26 DIAGNOSIS — C569 Malignant neoplasm of unspecified ovary: Secondary | ICD-10-CM | POA: Insufficient documentation

## 2020-07-26 DIAGNOSIS — Z800 Family history of malignant neoplasm of digestive organs: Secondary | ICD-10-CM | POA: Diagnosis not present

## 2020-07-26 DIAGNOSIS — D701 Agranulocytosis secondary to cancer chemotherapy: Secondary | ICD-10-CM | POA: Diagnosis not present

## 2020-07-26 DIAGNOSIS — I10 Essential (primary) hypertension: Secondary | ICD-10-CM | POA: Diagnosis not present

## 2020-07-26 DIAGNOSIS — Z7952 Long term (current) use of systemic steroids: Secondary | ICD-10-CM | POA: Insufficient documentation

## 2020-07-26 DIAGNOSIS — Z87891 Personal history of nicotine dependence: Secondary | ICD-10-CM | POA: Insufficient documentation

## 2020-07-26 DIAGNOSIS — J449 Chronic obstructive pulmonary disease, unspecified: Secondary | ICD-10-CM | POA: Insufficient documentation

## 2020-07-26 DIAGNOSIS — Z7982 Long term (current) use of aspirin: Secondary | ICD-10-CM | POA: Insufficient documentation

## 2020-07-26 DIAGNOSIS — Z5189 Encounter for other specified aftercare: Secondary | ICD-10-CM | POA: Diagnosis not present

## 2020-07-26 DIAGNOSIS — Z8042 Family history of malignant neoplasm of prostate: Secondary | ICD-10-CM | POA: Insufficient documentation

## 2020-07-26 DIAGNOSIS — C786 Secondary malignant neoplasm of retroperitoneum and peritoneum: Secondary | ICD-10-CM

## 2020-07-26 DIAGNOSIS — Z79899 Other long term (current) drug therapy: Secondary | ICD-10-CM | POA: Diagnosis not present

## 2020-07-26 DIAGNOSIS — G629 Polyneuropathy, unspecified: Secondary | ICD-10-CM | POA: Diagnosis not present

## 2020-07-26 DIAGNOSIS — E876 Hypokalemia: Secondary | ICD-10-CM | POA: Insufficient documentation

## 2020-07-26 DIAGNOSIS — Z8249 Family history of ischemic heart disease and other diseases of the circulatory system: Secondary | ICD-10-CM | POA: Diagnosis not present

## 2020-07-26 DIAGNOSIS — Z5111 Encounter for antineoplastic chemotherapy: Secondary | ICD-10-CM | POA: Diagnosis present

## 2020-07-26 DIAGNOSIS — Z95828 Presence of other vascular implants and grafts: Secondary | ICD-10-CM

## 2020-07-26 LAB — CBC WITH DIFFERENTIAL/PLATELET
Abs Immature Granulocytes: 0.02 10*3/uL (ref 0.00–0.07)
Basophils Absolute: 0 10*3/uL (ref 0.0–0.1)
Basophils Relative: 1 %
Eosinophils Absolute: 0.1 10*3/uL (ref 0.0–0.5)
Eosinophils Relative: 1 %
HCT: 27.4 % — ABNORMAL LOW (ref 36.0–46.0)
Hemoglobin: 8.7 g/dL — ABNORMAL LOW (ref 12.0–15.0)
Immature Granulocytes: 0 %
Lymphocytes Relative: 18 %
Lymphs Abs: 0.8 10*3/uL (ref 0.7–4.0)
MCH: 28.9 pg (ref 26.0–34.0)
MCHC: 31.8 g/dL (ref 30.0–36.0)
MCV: 91 fL (ref 80.0–100.0)
Monocytes Absolute: 0.4 10*3/uL (ref 0.1–1.0)
Monocytes Relative: 9 %
Neutro Abs: 3.1 10*3/uL (ref 1.7–7.7)
Neutrophils Relative %: 71 %
Platelets: 555 10*3/uL — ABNORMAL HIGH (ref 150–400)
RBC: 3.01 MIL/uL — ABNORMAL LOW (ref 3.87–5.11)
RDW: 14.4 % (ref 11.5–15.5)
WBC: 4.5 10*3/uL (ref 4.0–10.5)
nRBC: 0 % (ref 0.0–0.2)

## 2020-07-26 LAB — COMPREHENSIVE METABOLIC PANEL
ALT: 9 U/L (ref 0–44)
AST: 17 U/L (ref 15–41)
Albumin: 2.8 g/dL — ABNORMAL LOW (ref 3.5–5.0)
Alkaline Phosphatase: 78 U/L (ref 38–126)
Anion gap: 11 (ref 5–15)
BUN: <5 mg/dL — ABNORMAL LOW (ref 8–23)
CO2: 29 mmol/L (ref 22–32)
Calcium: 8.5 mg/dL — ABNORMAL LOW (ref 8.9–10.3)
Chloride: 100 mmol/L (ref 98–111)
Creatinine, Ser: 0.49 mg/dL (ref 0.44–1.00)
GFR calc Af Amer: >60 mL/min (ref >60)
GFR calc non Af Amer: >60 mL/min (ref >60)
Glucose, Bld: 111 mg/dL — ABNORMAL HIGH (ref 70–99)
Potassium: 3.6 mmol/L (ref 3.5–5.1)
Sodium: 140 mmol/L (ref 135–145)
Total Bilirubin: 0.4 mg/dL (ref 0.3–1.2)
Total Protein: 5.9 g/dL — ABNORMAL LOW (ref 6.5–8.1)

## 2020-07-26 MED ORDER — SODIUM CHLORIDE 0.9 % IV SOLN: Freq: Once | INTRAVENOUS | Status: AC

## 2020-07-26 MED ORDER — DIPHENHYDRAMINE HCL 50 MG/ML IJ SOLN
50.0000 mg | Freq: Once | INTRAMUSCULAR | Status: AC
  Administered 2020-07-26: 50 mg via INTRAVENOUS

## 2020-07-26 MED ORDER — PALONOSETRON HCL INJECTION 0.25 MG/5ML
INTRAVENOUS | Status: AC
  Filled 2020-07-26: qty 5

## 2020-07-26 MED ORDER — SODIUM CHLORIDE 0.9 % IV SOLN
540.0000 mg | Freq: Once | INTRAVENOUS | Status: AC
  Administered 2020-07-26: 540 mg via INTRAVENOUS
  Filled 2020-07-26: qty 54

## 2020-07-26 MED ORDER — HEPARIN SOD (PORK) LOCK FLUSH 100 UNIT/ML IV SOLN
500.0000 [IU] | Freq: Once | INTRAVENOUS | Status: AC | PRN
  Administered 2020-07-26: 500 [IU]

## 2020-07-26 MED ORDER — FAMOTIDINE IN NACL 20-0.9 MG/50ML-% IV SOLN
INTRAVENOUS | Status: AC
  Filled 2020-07-26: qty 50

## 2020-07-26 MED ORDER — FAMOTIDINE IN NACL 20-0.9 MG/50ML-% IV SOLN
20.0000 mg | Freq: Once | INTRAVENOUS | Status: AC
  Administered 2020-07-26: 20 mg via INTRAVENOUS

## 2020-07-26 MED ORDER — SODIUM CHLORIDE 0.9 % IV SOLN
140.0000 mg/m2 | Freq: Once | INTRAVENOUS | Status: AC
  Administered 2020-07-26: 216 mg via INTRAVENOUS
  Filled 2020-07-26: qty 36

## 2020-07-26 MED ORDER — SODIUM CHLORIDE 0.9% FLUSH
10.0000 mL | INTRAVENOUS | Status: DC | PRN
  Administered 2020-07-26: 10 mL

## 2020-07-26 MED ORDER — SODIUM CHLORIDE 0.9 % IV SOLN
150.0000 mg | Freq: Once | INTRAVENOUS | Status: AC
  Administered 2020-07-26: 150 mg via INTRAVENOUS
  Filled 2020-07-26: qty 150

## 2020-07-26 MED ORDER — DIPHENHYDRAMINE HCL 50 MG/ML IJ SOLN
INTRAMUSCULAR | Status: AC
  Filled 2020-07-26: qty 1

## 2020-07-26 MED ORDER — PALONOSETRON HCL INJECTION 0.25 MG/5ML
0.2500 mg | Freq: Once | INTRAVENOUS | Status: AC
  Administered 2020-07-26: 0.25 mg via INTRAVENOUS

## 2020-07-26 MED ORDER — SODIUM CHLORIDE 0.9 % IV SOLN
10.0000 mg | Freq: Once | INTRAVENOUS | Status: AC
  Administered 2020-07-26: 10 mg via INTRAVENOUS
  Filled 2020-07-26: qty 10

## 2020-07-26 NOTE — Progress Notes (Signed)
[address], [place] [address]   CLINIC:  Medical Oncology/Hematology  PCP:  [name], [address] / [place] [address] [phone]   REASON FOR VISIT:  Follow-up for high-grade serous ovarian carcinoma  PRIOR THERAPY: None  NGS Results: Not done  CURRENT THERAPY: Carboplatin and paclitaxel every 3 weeks  BRIEF ONCOLOGIC HISTORY:  Oncology History  Peritoneal carcinomatosis ([place])  06/06/2020 Initial Diagnosis   Peritoneal carcinomatosis ([place])   07/05/2020 -  Chemotherapy   The patient had palonosetron (ALOXI) injection 0.25 mg, 0.25 mg, Intravenous,  Once, 1 of 6 cycles Administration: 0.25 mg (07/05/2020) pegfilgrastim-cbqv (UDENYCA) injection 6 mg, 6 mg, Subcutaneous, Once, 1 of 6 cycles Administration: 6 mg (07/07/2020) CARBOplatin (PARAPLATIN) 540 mg in sodium chloride 0.9 % 250 mL chemo infusion, 540 mg (100 % of original dose 543.6 mg), Intravenous,  Once, 1 of 6 cycles Dose modification:   (original dose 543.6 mg, Cycle 1) Administration: 540 mg (07/05/2020) fosaprepitant (EMEND) 150 mg in sodium chloride 0.9 % 145 mL IVPB, 150 mg, Intravenous,  Once, 1 of 6 cycles Administration: 150 mg (07/05/2020) PACLitaxel (TAXOL) 216 mg in sodium chloride 0.9 % 250 mL chemo infusion (> 80mg /m2), 140 mg/m2 = 216 mg (80 % of original dose 175 mg/m2), Intravenous,  Once, 1 of 6 cycles Dose modification: 140 mg/m2 (80 % of original dose 175 mg/m2, Cycle 1, Reason: Other (see comments), Comment: neuropathy) Administration: 216 mg (07/05/2020)  for chemotherapy treatment.      CANCER STAGING: Cancer Staging No matching staging information was found for the patient.  INTERVAL HISTORY:  [name], a 64 y.o. female, returns for routine follow-up and consideration for next cycle of chemotherapy. [name] was last seen on 07/13/2020.  Due for cycle #2 of carboplatin and paclitaxel today.   Overall, she tells me she has been  feeling pretty well. She reports feeling a little weak, but otherwise feeling okay. She went to Dr. Raul Boyd at [facility] for her COPD exacerbation and bronchitis and was prescribed Levaquin 500 mg for 7 days and prednisone daily for 10 days. She tolerated the previous treatment well and denies having N/V/D and her numbness in her legs is stable.   Overall, she feels ready for next cycle of chemo today.    REVIEW OF SYSTEMS:  Review of Systems  Constitutional: Positive for fatigue (moderate). Negative for appetite change.  Respiratory: Positive for cough and shortness of breath.   Gastrointestinal: Negative for diarrhea, nausea and vomiting.  Neurological: Positive for numbness (legs; stable).  All other systems reviewed and are negative.   PAST MEDICAL/SURGICAL HISTORY:  Past Medical History:  Diagnosis Date  . Anemia   . Asthma   . Cancer [facility])    Gastric Cancer  . COPD (chronic obstructive pulmonary disease) ([place])   . High cholesterol   . Hypertension   . Neuropathy   . Pneumonia 2019  . Port-A-Cath in place 07/03/2020   Past Surgical History:  Procedure Laterality Date  . HALLUX VALGUS BASE WEDGE Right 06/09/2015   Procedure: Base wedge osteotomy with modified McBride right foot ;  Surgeon: Sharlotte Alamo, MD;  Location: [facility] ORS;  Service: Podiatry;  Laterality: Right;  . PORTACATH PLACEMENT Left 06/28/2020   Procedure: PORT-A-CATHETER PLACEMENT LEFT CHEST (attached catheter in left subclavian);  Surgeon: Virl Cagey, MD;  Location: [facility] ORS;  Service: General;  Laterality: Left;    SOCIAL HISTORY:  Social History   Socioeconomic  History  . Marital status: Divorced    Spouse name: Not on file  . Number of children: 3  . Years of education: Not on file  . Highest education level: Not on file  Occupational History  . Occupation: DISABLED  Tobacco Use  . Smoking status: Former Smoker    Packs/day: 2.00    Years: 30.00    Pack years: 60.00    Types: Cigarettes      Quit date: 11/04/2013    Years since quitting: 6.7  . Smokeless tobacco: Never Used  Vaping Use  . Vaping Use: Never used  Substance and Sexual Activity  . Alcohol use: No  . Drug use: No  . Sexual activity: Not Currently  Other Topics Concern  . Not on file  Social History Narrative  . Not on file   Social Determinants of Health   Financial Resource Strain: Medium Risk  . Difficulty of Paying Living Expenses: Somewhat hard  Food Insecurity: No Food Insecurity  . Worried About Charity fundraiser in the Last Year: Never true  . Ran Out of Food in the Last Year: Never true  Transportation Needs: No Transportation Needs  . Lack of Transportation (Medical): No  . Lack of Transportation (Non-Medical): No  Physical Activity: Inactive  . Days of Exercise per Week: 0 days  . Minutes of Exercise per Session: 0 min  Stress: Stress Concern Present  . Feeling of Stress : To some extent  Social Connections: Moderately Isolated  . Frequency of Communication with Friends and Family: More than three times a week  . Frequency of Social Gatherings with Friends and Family: Never  . Attends Religious Services: More than 4 times per year  . Active Member of Clubs or Organizations: No  . Attends Archivist Meetings: Never  . Marital Status: Divorced  Human resources officer Violence: Not At Risk  . Fear of Current or Ex-Partner: No  . Emotionally Abused: No  . Physically Abused: No  . Sexually Abused: No    FAMILY HISTORY:  Family History  Problem Relation Age of Onset  . Alzheimer's disease Mother   . COPD Father   . Emphysema Father   . Hypertension Father   . Healthy Sister   . Healthy Brother   . Alzheimer's disease Maternal Grandmother   . Healthy Sister   . Healthy Sister   . Healthy Sister   . Prostate cancer Other        paternal grandmother's brother; dx in early 23s  . Breast cancer Neg Hx     CURRENT MEDICATIONS:  Current Outpatient Medications  Medication  Sig Dispense Refill  . albuterol (PROVENTIL HFA;VENTOLIN HFA) 108 (90 BASE) MCG/ACT inhaler Inhale 4-6 puffs into the lungs every 6 (six) hours as needed for wheezing or shortness of breath.     Marland Kitchen amLODipine (NORVASC) 5 MG tablet Take 5 mg by mouth at bedtime.     Marland Kitchen aspirin EC 81 MG tablet Take 81 mg by mouth daily.    Marland Kitchen CARBOPLATIN IV Inject into the vein every 21 ( twenty-one) days.    . ciprofloxacin (CIPRO) 500 MG tablet Take 1 tablet (500 mg total) by mouth 2 (two) times daily. 10 tablet 0  . docusate sodium (COLACE) 100 MG capsule Take 1 capsule (100 mg total) by mouth 2 (two) times daily as needed for mild constipation. 60 capsule 0  . ferrous sulfate 325 (65 FE) MG tablet Take 325 mg by mouth 2 (two) times  daily with a meal.     . Fluticasone-Umeclidin-Vilant (TRELEGY ELLIPTA) 100-62.5-25 MCG/INH AEPB Inhale into the lungs.    . gabapentin (NEURONTIN) 300 MG capsule Take 300 mg by mouth 3 (three) times daily.     Marland Kitchen ipratropium (ATROVENT HFA) 17 MCG/ACT inhaler Inhale 2 puffs into the lungs every 6 (six) hours as needed for wheezing.     Marland Kitchen ipratropium-albuterol (DUONEB) 0.5-2.5 (3) MG/3ML SOLN Take 0.5 mg by nebulization every 6 (six) hours as needed.     . nortriptyline (PAMELOR) 25 MG capsule Take 25 mg by mouth at bedtime.    Marland Kitchen oxyCODONE-acetaminophen (PERCOCET/ROXICET) 5-325 MG tablet Take 1 tablet by mouth every 4 (four) hours as needed for severe pain. 30 tablet 0  . OXYGEN Inhale 3 L into the lungs continuous.     Marland Kitchen PACLITAXEL IV Inject into the vein every 21 ( twenty-one) days.    . pantoprazole (PROTONIX) 40 MG tablet Take 1 tablet (40 mg total) by mouth daily. 30 tablet 2  . predniSONE (DELTASONE) 5 MG tablet Take 5 mg by mouth daily with breakfast.    . simvastatin (ZOCOR) 40 MG tablet Take 40 mg by mouth at bedtime.    . lidocaine-prilocaine (EMLA) cream Apply a small amount to port a cath site and cover with plastic wrap 1 hour prior to chemotherapy appointments (Patient not  taking: Reported on 07/26/2020) 30 g 3  . ondansetron (ZOFRAN ODT) 4 MG disintegrating tablet 4mg  ODT q4 hours prn nausea/vomit (Patient not taking: Reported on 07/26/2020) 12 tablet 0  . prochlorperazine (COMPAZINE) 10 MG tablet Take 1 tablet (10 mg total) by mouth every 6 (six) hours as needed (Nausea or vomiting). (Patient not taking: Reported on 07/26/2020) 30 tablet 1   No current facility-administered medications for this visit.    ALLERGIES:  No Known Allergies  PHYSICAL EXAM:  Performance status (ECOG): 1 - Symptomatic but completely ambulatory  Vitals:   07/26/20 0819  BP: 99/62  Pulse: 94  Resp: 18  Temp: (!) 96.9 F (36.1 C)  SpO2: 96%   Wt Readings from Last 3 Encounters:  07/26/20 119 lb 9.6 oz (54.3 kg)  07/13/20 119 lb 3.2 oz (54.1 kg)  07/08/20 121 lb 14.6 oz (55.3 kg)   Physical Exam Vitals reviewed.  Constitutional:      Appearance: Normal appearance.     Interventions: Nasal cannula in place.  Cardiovascular:     Rate and Rhythm: Normal rate and regular rhythm.     Pulses: Normal pulses.     Heart sounds: Normal heart sounds.  Pulmonary:     Effort: Pulmonary effort is normal.     Breath sounds: Examination of the right-lower field reveals rhonchi. Examination of the left-lower field reveals rhonchi. Rhonchi present. No wheezing.  Chest:     Comments: Port-a-Cath in L chest Neurological:     General: No focal deficit present.     Mental Status: She is alert and oriented to person, place, and time.  Psychiatric:        Mood and Affect: Mood normal.        Behavior: Behavior normal.     LABORATORY DATA:  I have reviewed the labs as listed.  CBC Latest Ref Rng & Units 07/26/2020 07/13/2020 07/08/2020  WBC 4.0 - 10.5 K/uL 4.5 27.3(H) 21.3(H)  Hemoglobin 12.0 - 15.0 g/dL 8.7(L) 7.0(L) 8.9(L)  Hematocrit 36 - 46 % 27.4(L) 21.6(L) 28.5(L)  Platelets 150 - 400 K/uL 555(H) 327 365   CMP  Latest Ref Rng & Units 07/26/2020 07/13/2020 07/08/2020  Glucose 70 - 99  mg/dL 111(H) 105(H) 121(H)  BUN 8 - 23 mg/dL <5(L) 7(L) 37(H)  Creatinine 0.44 - 1.00 mg/dL 0.49 0.50 0.45  Sodium 135 - 145 mmol/L 140 130(L) 135  Potassium 3.5 - 5.1 mmol/L 3.6 3.2(L) 4.3  Chloride 98 - 111 mmol/L 100 87(L) 95(L)  CO2 22 - 32 mmol/L 29 33(H) 32  Calcium 8.9 - 10.3 mg/dL 8.5(L) 8.0(L) 8.7(L)  Total Protein 6.5 - 8.1 g/dL 5.9(L) 5.4(L) 5.9(L)  Total Bilirubin 0.3 - 1.2 mg/dL 0.4 0.2(L) 0.3  Alkaline Phos 38 - 126 U/L 78 100 90  AST 15 - 41 U/L 17 26 23   ALT 0 - 44 U/L 9 12 14     DIAGNOSTIC IMAGING:  I have independently reviewed the scans and discussed with the patient. CT Chest W Contrast  Result Date: 06/28/2020 CLINICAL DATA:  Peritoneal carcinomatosis of unknown primary. EXAM: CT CHEST WITH CONTRAST TECHNIQUE: Multidetector CT imaging of the chest was performed during intravenous contrast administration. CONTRAST:  79mL OMNIPAQUE IOHEXOL 300 MG/ML  SOLN COMPARISON:  10/08/2019 FINDINGS: Cardiovascular: The heart size is normal. No substantial pericardial effusion. Coronary artery calcification is evident. Atherosclerotic calcification is noted in the wall of the thoracic aorta. Mediastinum/Nodes: 11 mm short axis low right paratracheal node identified on 49/2. 11 mm short axis precarinal node associated. Subcarinal lymphadenopathy measures 14 mm short axis. Small lymph nodes are seen in the hilar regions bilaterally. The esophagus has normal imaging features. Tiny hiatal hernia evident. There is no axillary lymphadenopathy. Lungs/Pleura: Centrilobular and paraseptal emphysema evident. Architectural distortion and scarring noted in the upper lungs bilaterally. Basilar predominant bronchial wall thickening with bronchiectasis and small airway impaction is noted bilaterally, involving the lingula and both lower lobes. Consolidative airspace disease is seen in the peripheral left lower lobe the base. No substantial pleural effusion. Upper Abdomen: Small volume fluid noted adjacent  to the liver and spleen. Mild lymphadenopathy noted around the esophagogastric junction. Musculoskeletal: No worrisome lytic or sclerotic osseous abnormality. IMPRESSION: 1. No findings to suggest primary malignancy in the thorax. 2. Mediastinal lymphadenopathy is suspicious for metastatic disease. 3. Basilar predominant bronchial wall thickening with bronchiectasis and small airway impaction. Imaging features compatible with sequelae of chronic atypical infection although in the appropriate clinical setting, aspiration could have this appearance. There is a small region of associated consolidative opacity in the left base which may be related to atelectasis or infection. 4. Small volume ascites, incompletely visualized. 5. Aortic Atherosclerosis (ICD10-I70.0) and Emphysema (ICD10-J43.9). Electronically Signed   By: Misty Stanley M.D.   On: 06/28/2020 09:19   CT ABDOMEN PELVIS W CONTRAST  Result Date: 07/08/2020 CLINICAL DATA:  Lower abdominal pain and shortness of breath 2 days. History of metastatic ovarian carcinoma. EXAM: CT ABDOMEN AND PELVIS WITH CONTRAST TECHNIQUE: Multidetector CT imaging of the abdomen and pelvis was performed using the standard protocol following bolus administration of intravenous contrast. CONTRAST:  75 mL OMNIPAQUE IOHEXOL 300 MG/ML  SOLN COMPARISON:  CT abdomen and pelvis 05/30/2019. FINDINGS: Lower chest: Trace pleural effusions. Extensive emphysematous disease in the lung bases. Mild dependent atelectasis. Hepatobiliary: Likely subcapsular cyst along the posterior right hepatic lobe is unchanged. No focal liver lesion or intrahepatic biliary ductal dilatation. No evidence of cholecystitis or gallstones. Pancreas: Unremarkable. No pancreatic ductal dilatation or surrounding inflammatory changes. Spleen: Normal in size. A few calcifications are seen in the spleen. Adrenals/Urinary Tract: Adrenal glands are unremarkable. Kidneys are normal, without renal  calculi, focal lesion, or  hydronephrosis. Bladder is unremarkable. Stomach/Bowel: Small to moderate hiatal hernia contains fluid suggestive of esophageal dysmotility or gastroesophageal reflux. Walls of the distal esophagus are thickened. Appendix appears normal. No evidence of bowel wall thickening, distention, or inflammatory changes. Diverticulosis noted. Vascular/Lymphatic: Mildly prominent mesenteric lymph nodes are again seen and not notably changed. Extensive atherosclerosis again seen. Reproductive: Fibroid uterus again seen. The ovaries are difficult to discretely visualize. Other: Small volume of abdominal and pelvic ascites is seen. Extensive omental tumor and a tumor implant along the medial wall of the gallbladder do not appear grossly changed. Musculoskeletal: No acute or focal bony abnormality. IMPRESSION: No acute abnormality within the abdomen or pelvis. Small to moderate hiatal hernia contains fluid which could be due to reflux or poor motility. Thickening of the walls of the distal esophagus worrisome for inflammatory change. Extensive omental tumor and small volume of abdominal and pelvic ascites do not appear markedly changed. Trace pleural effusions also noted. Diverticulosis without diverticulitis. Moderate to moderately large volume of stool throughout the colon is seen. Fibroid uterus. Aortic Atherosclerosis (ICD10-I70.0) and Emphysema (ICD10-J43.9). Electronically Signed   By: Inge Rise M.D.   On: 07/08/2020 15:08   DG Chest Portable 1 View  Result Date: 07/08/2020 CLINICAL DATA:  Chest pain dyspnea and nausea. EXAM: PORTABLE CHEST 1 VIEW COMPARISON:  Single-view of the chest 06/28/2020. CT chest 06/27/2020. FINDINGS: Port-A-Cath is unchanged. The lungs are severely emphysematous. Bronchiectatic change and atelectasis in the left lung base are again seen. No pneumothorax or pleural effusion. Aortic atherosclerosis. Heart size is normal. No acute or focal bony abnormality. IMPRESSION: No acute disease.  Aortic Atherosclerosis (ICD10-I70.0) and Emphysema (ICD10-J43.9). Electronically Signed   By: Inge Rise M.D.   On: 07/08/2020 13:36   DG Chest Port 1 View  Result Date: 06/28/2020 CLINICAL DATA:  Port placement EXAM: PORTABLE CHEST 1 VIEW COMPARISON:  2019 FINDINGS: Left chest wall port with catheter tip near the cavoatrial junction. No pneumothorax. Chronic interstitial prominence. Scarring at the lung bases. No pleural effusion. Similar cardiomediastinal contours. IMPRESSION: Left chest wall port with catheter tip near the cavoatrial junction. No pneumothorax. Electronically Signed   By: Macy Mis M.D.   On: 06/28/2020 09:23   DG C-Arm 1-60 Min-No Report  Result Date: 06/28/2020 Fluoroscopy was utilized by the requesting physician.  No radiographic interpretation.     ASSESSMENT:  1. Peritoneal carcinomatosis: -Presentation to the ER with right lower quadrant abdominal pain on and off for 1 month. -CTAP on 05/29/2020 showed extensive nodularity throughout the omentum and upper peritoneal cavity. Both ovaries are prominent although no well-defined mass is noted.Small volume ascites. -CA-125 is elevated at 2407. CEA was 2.4. -CT of the chest shows 11 mm right paratracheal lymph node and 11 mm short axis precarinal lymph node. Subcarinal lymphadenopathy measuring 14 mm short axis. This is suspicious for metastatic disease. 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 -Cycle 1 of carboplatin and paclitaxel on 07/05/2020. -Evaluated by Dr. Denman George on 07/07/2020.  Reevaluation after 3 cycles.  2. COPD: -Quit smoking 6 years ago. Smoked 2 packs/day for 25-30 years. -Has been on3 L/min oxygen via nasal cannulafor the last 5 years.  3. Family history: -Paternal grandmother with colon cancer.   PLAN:  1.High-grade serous  ovarian carcinoma: -I have reviewed her CBC today which showed normal white count and platelets. -LFTs were normal. -She will proceed  with her cycle 2 today.  Paclitaxel will be dose reduced to 140 mg/M square. -We will see her back in 3 weeks for follow-up.  2. Family history: -She was referred to genetic testing.  3. Peripheral neuropathy: -Continue gabapentin 300 mg 3 times a day.  Paclitaxel is dose reduced.  No worsening of neuropathy since first cycle.  4.  Hypokalemia: -Potassium today 3.6.  Continue potassium 40 mg daily.   Orders placed this encounter:  No orders of the defined types were placed in this encounter.    Derek Jack, MD [place] [phone]   I, Milinda Antis, am acting as a scribe for Dr. Sanda Linger.  I, Derek Jack MD, have reviewed the above documentation for accuracy and completeness, and I agree with the above.

## 2020-07-26 NOTE — Progress Notes (Signed)
Patient presents today for treatment and follow up visit with [name]. Labs reviewed by MD and within parameters for treatment. MAR reviewed. Message received from [facility] RN/ [name] to proceed with treatment.   Treatment given today per MD orders. Tolerated infusion without adverse affects. Vital signs stable. No complaints at this time. Discharged from clinic via wheel chair.  F/U with [facility] as scheduled.

## 2020-07-26 NOTE — Progress Notes (Signed)
Patient was assessed by [name] and labs have been reviewed.  Albumin is low and patient encouraged to increase protein in her diet.  Patient is okay to proceed with treatment today. Primary RN and pharmacy aware.

## 2020-07-26 NOTE — Patient Instructions (Signed)
[facility] Discharge Instructions for Patients Receiving Chemotherapy  Today you received the following chemotherapy agents   To help prevent nausea and vomiting after your treatment, we encourage you to take your nausea medication   If you develop nausea and vomiting that is not controlled by your nausea medication, call the clinic.   BELOW ARE SYMPTOMS THAT SHOULD BE REPORTED IMMEDIATELY:  *FEVER GREATER THAN 100.5 F  *CHILLS WITH OR WITHOUT FEVER  NAUSEA AND VOMITING THAT IS NOT CONTROLLED WITH YOUR NAUSEA MEDICATION  *UNUSUAL SHORTNESS OF BREATH  *UNUSUAL BRUISING OR BLEEDING  TENDERNESS IN MOUTH AND THROAT WITH OR WITHOUT PRESENCE OF ULCERS  *URINARY PROBLEMS  *BOWEL PROBLEMS  UNUSUAL RASH Items with * indicate a potential emergency and should be followed up as soon as possible.  Feel free to call the clinic should you have any questions or concerns. The clinic phone number is [phone].  Please show the CHEMO ALERT CARD at check-in to the Emergency Department and triage nurse.   

## 2020-07-26 NOTE — Patient Instructions (Signed)
[place] at [facility] Discharge Instructions  You were seen today by [name]. He went over your recent results. You received your treatment today. Eat a high-protein diet or supplement your meals with high-protein Boost or Ensure to raise your protein level. [name] will see you back in 3 weeks for labs and follow up.   Thank you for choosing [place] at [facility] to provide your oncology and hematology care.  To afford each patient quality time with our provider, please arrive at least 15 minutes before your scheduled appointment time.   If you have a lab appointment with the [facility] please come in thru the Main Entrance and check in at the main information desk  You need to re-schedule your appointment should you arrive 10 or more minutes late.  We strive to give you quality time with our providers, and arriving late affects you and other patients whose appointments are after yours.  Also, if you no show three or more times for appointments you may be dismissed from the clinic at the providers discretion.     Again, thank you for choosing [facility].  Our hope is that these requests will decrease the amount of time that you wait before being seen by our physicians.       _____________________________________________________________  Should you have questions after your visit to [facility], please contact our office at [phone] between the hours of 8:00 a.m. and 4:30 p.m.  Voicemails left after 4:00 p.m. will not be returned until the following business day.  For prescription refill requests, have your pharmacy contact our office and allow 72 hours.    Cancer Center Support Programs:   > Cancer Support Group  2nd Tuesday of the month 1pm-2pm, Journey Room

## 2020-07-28 ENCOUNTER — Encounter (HOSPITAL_COMMUNITY): Payer: Self-pay

## 2020-07-28 ENCOUNTER — Other Ambulatory Visit: Payer: Self-pay

## 2020-07-28 ENCOUNTER — Inpatient Hospital Stay (HOSPITAL_COMMUNITY): Payer: Medicare HMO

## 2020-07-28 VITALS — BP 101/57 | HR 94 | Temp 97.8°F | Resp 18

## 2020-07-28 DIAGNOSIS — C786 Secondary malignant neoplasm of retroperitoneum and peritoneum: Secondary | ICD-10-CM

## 2020-07-28 DIAGNOSIS — Z95828 Presence of other vascular implants and grafts: Secondary | ICD-10-CM

## 2020-07-28 DIAGNOSIS — Z5111 Encounter for antineoplastic chemotherapy: Secondary | ICD-10-CM | POA: Diagnosis not present

## 2020-07-28 MED ORDER — PEGFILGRASTIM-CBQV 6 MG/0.6ML ~~LOC~~ SOSY
6.0000 mg | PREFILLED_SYRINGE | Freq: Once | SUBCUTANEOUS | Status: AC
  Administered 2020-07-28: 6 mg via SUBCUTANEOUS
  Filled 2020-07-28: qty 0.6

## 2020-07-28 NOTE — Progress Notes (Signed)
Pt here today for Udenyca injection. Pt given injection in right arm. Pt tolerated injection well with no complaints. Pt stable and discharged home with significant other via wheelchair. Pt to return as scheduled.

## 2020-07-31 ENCOUNTER — Other Ambulatory Visit: Payer: Self-pay | Admitting: Oncology

## 2020-07-31 ENCOUNTER — Encounter: Payer: Self-pay | Admitting: Oncology

## 2020-07-31 ENCOUNTER — Telehealth: Payer: Self-pay | Admitting: Oncology

## 2020-07-31 NOTE — Progress Notes (Signed)
Gynecologic Oncology Multi-Disciplinary Disposition Conference Note  Date of the Conference: 07/31/2020  Patient Name: [name]  Referring Provider: [name] Primary GYN Oncologist: Dr. 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 Disposition is for 6 cycles of neoadjuvant chemotherapy with repeat imaging and follow up with Dr. Denman George after 3 cycles.  Consideration for debulking surgery after 6 cycles of chemotherapy and pulmonary optimization.   This Multidisciplinary conference took place involving physicians from [place], [place], Radiation Oncology, Pathology, Radiology along with the Gynecologic Oncology Nurse Practitioner and RN.  Comprehensive assessment of the patient's malignancy, staging, need for surgery, chemotherapy, radiation therapy, and need for further testing were reviewed. Supportive measures, both inpatient and following discharge were also discussed. The recommended plan of care is documented. Greater than 35 minutes were spent correlating and coordinating this patient's care.

## 2020-07-31 NOTE — Telephone Encounter (Signed)
Called [name] and advised her of GYN Cancer Conference recommendations for 6 cycles of chemo and then consideration for surgery. She verbalized understanding and agreement.

## 2020-07-31 NOTE — Telephone Encounter (Signed)
°  Revealed negative genetic testing.  Discussed that we do not know why she has ovarian cancer. Her cancer could be sporadic, due to a different gene that we are not testing, or maybe our current technology may not be able to pick something up.  It will be important for her to keep in contact with genetics to keep up with whether additional testing may be needed.

## 2020-08-01 ENCOUNTER — Encounter: Payer: Self-pay | Admitting: Genetic Counselor

## 2020-08-01 ENCOUNTER — Ambulatory Visit: Payer: Self-pay | Admitting: Genetic Counselor

## 2020-08-01 DIAGNOSIS — Z1379 Encounter for other screening for genetic and chromosomal anomalies: Secondary | ICD-10-CM | POA: Insufficient documentation

## 2020-08-01 DIAGNOSIS — Z8042 Family history of malignant neoplasm of prostate: Secondary | ICD-10-CM

## 2020-08-01 DIAGNOSIS — C569 Malignant neoplasm of unspecified ovary: Secondary | ICD-10-CM

## 2020-08-01 DIAGNOSIS — C786 Secondary malignant neoplasm of retroperitoneum and peritoneum: Secondary | ICD-10-CM

## 2020-08-01 NOTE — Progress Notes (Signed)
HPI:  Ms. 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 Please refer to our prior cancer genetics clinic note for more information regarding our discussion, assessment and recommendations, at the time. Ms. Suttles recent genetic test results were disclosed to her, as were recommendations warranted by these results. These results and recommendations are discussed in more detail below.  CANCER HISTORY:  Oncology History  Peritoneal carcinomatosis ([place])  06/06/2020 Initial Diagnosis   Peritoneal carcinomatosis ([place])   07/05/2020 -  Chemotherapy   The patient had palonosetron (ALOXI) injection 0.25 mg, 0.25 mg, Intravenous,  Once, 2 of 6 cycles Administration: 0.25 mg (07/05/2020), 0.25 mg (07/26/2020) pegfilgrastim-cbqv (UDENYCA) injection 6 mg, 6 mg, Subcutaneous, Once, 2 of 6 cycles Administration: 6 mg (07/07/2020) CARBOplatin (PARAPLATIN) 540 mg in sodium chloride 0.9 % 250 mL chemo infusion, 540 mg (100 % of original dose 543.6 mg), Intravenous,  Once, 2 of 6 cycles Dose modification:   (original dose 543.6 mg, Cycle 1), 540 mg (original dose 540 mg, Cycle 2) Administration: 540 mg (07/05/2020), 540 mg (07/26/2020) fosaprepitant (EMEND) 150 mg in sodium chloride 0.9 % 145 mL IVPB, 150 mg, Intravenous,  Once, 2 of 6 cycles Administration: 150 mg (07/05/2020), 150 mg (07/26/2020) PACLitaxel (TAXOL) 216 mg in sodium chloride 0.9 % 250 mL chemo infusion (> 75m/m2), 140 mg/m2 = 216 mg (80 % of original dose 175 mg/m2), Intravenous,  Once, 2 of 6 cycles Dose modification: 140 mg/m2 (80 % of original dose 175 mg/m2, Cycle 1, Reason: Other (see comments), Comment: neuropathy) Administration: 216 mg (07/05/2020), 216 mg (07/26/2020)  for chemotherapy treatment.    07/22/2020 Genetic  Testing   Negative genetic testing: no pathogenic variants detected in [facility] Expanded CancerNext Panel + RNAInsight. [facility] sequencing and rearrangement analysis for the following 77 genes: AIP, ALK, APC*, ATM*, AXIN2, BAP1, BARD1, BLM, BMPR1A, BRCA1*, BRCA2*, BRIP1*, CDC73, CDH1*, CDK4, CDKN1B, CDKN2A, CHEK2*, CTNNA1, DICER1, FANCC, FH, FLCN, GALNT12, KIF1B, LZTR1, MAX, MEN1, MET, MLH1*, MSH2*, MSH3, MSH6*, MUTYH*, NBN, NF1*, NF2, NTHL1, PALB2*, PHOX2B, PMS2*, POT1, PRKAR1A, PTCH1, PTEN*, RAD51C*, RAD51D*, RB1, RECQL, RET, SDHA, SDHAF2, SDHB, SDHC, SDHD, SMAD4, SMARCA4, SMARCB1, SMARCE1, STK11, SUFU, TMEM127, TP53*, TSC1, TSC2, VHL and XRCC2 (sequencing and deletion/duplication); EGFR, EGLN1, HOXB13, KIT, MITF, PDGFRA, POLD1, and POLE (sequencing only); EPCAM and GREM1 (deletion/duplication only). DNA and RNA analyses performed for * genes. The report date is July 22, 2020.      FAMILY HISTORY:  We obtained a detailed, 4-generation family history.  Significant diagnoses are listed below: Family History  Problem Relation Age of Onset   Prostate cancer Other        paternal grandmother's brother; dx in early 629s   Ms. MAguadohas two sons and one daughter, all of whom do not have a cancer history.  Ms. MPucciohas one full brother, two full sisters, and two maternal half brother brothers.  None of her siblings, nieces, or nephews have cancer. Her mother passed away at age 62 No cancer history was reported in her maternal uncle, cousin, or grandparents. Ms. MMowerfather passed away at age 62  No cancer history was reported in her paternal aunts, uncles, cousins, or grandparents.  Her paternal grandmother's brother was diagnosed with prostate cancer in his early 665sand  passed away at age 48 or 53.  She is not sure whether his prostate cancer was metastatic.  Ms. Dirden is unaware of previous family history of genetic testing for  hereditary cancer risks. Patient's maternal ancestors are of White/Caucasian descent, and paternal ancestors are of Native [place] and White/Caucasian descent. There is no reported Ashkenazi Jewish ancestry. There is no known consanguinity.  GENETIC TEST RESULTS: Genetic testing reported out on July 22, 2020. The [facility] Expanded CancerNext Panel +RNAInsight found no pathogenic mutations. The CancerNext-Expanded gene panel offered by [facility] and includes sequencing and rearrangement analysis for the following 77 genes: AIP, ALK, APC*, ATM*, AXIN2, BAP1, BARD1, BLM, BMPR1A, BRCA1*, BRCA2*, BRIP1*, CDC73, CDH1*, CDK4, CDKN1B, CDKN2A, CHEK2*, CTNNA1, DICER1, FANCC, FH, FLCN, GALNT12, KIF1B, LZTR1, MAX, MEN1, MET, MLH1*, MSH2*, MSH3, MSH6*, MUTYH*, NBN, NF1*, NF2, NTHL1, PALB2*, PHOX2B, PMS2*, POT1, PRKAR1A, PTCH1, PTEN*, RAD51C*, RAD51D*, RB1, RECQL, RET, SDHA, SDHAF2, SDHB, SDHC, SDHD, SMAD4, SMARCA4, SMARCB1, SMARCE1, STK11, SUFU, TMEM127, TP53*, TSC1, TSC2, VHL and XRCC2 (sequencing and deletion/duplication); EGFR, EGLN1, HOXB13, KIT, MITF, PDGFRA, POLD1, and POLE (sequencing only); EPCAM and GREM1 (deletion/duplication only). DNA and RNA analyses performed for * genes.  HRD testing was not performed for Ms. Bays due to [facility]'s sample limitations.   The test report has been scanned into EPIC and is located under the Molecular Pathology section of the Results Review tab.  A portion of the result report is included below for reference.     We discussed with Ms. Cowman that because current genetic testing is not perfect, it is possible there may be a gene mutation in one of these genes that current testing cannot detect, but that chance is small.  We also discussed, that there could be another gene that has not yet been discovered, or that we have not yet tested, that is responsible for the cancer diagnoses in the family. It is also possible there is a hereditary cause for the cancer in the family  that Ms. Choi did not inherit and therefore was not identified in her testing.  Therefore, it is important to remain in touch with cancer genetics in the future so that we can continue to offer Ms. Orrison the most up to date genetic testing.    ADDITIONAL GENETIC TESTING: We discussed with Ms. Strader that her genetic testing was fairly extensive.  If there are genes identified to increase cancer risk that can be analyzed in the future, we would be happy to discuss and coordinate this testing at that time.    CANCER SCREENING RECOMMENDATIONS: Ms. Martinovich test result is considered negative (normal).  This means that we have not identified a hereditary cause for her personal history of her ovarian cancer at this time. Most cancers happen by chance and this negative test suggests that her cancer may fall into this category.    While reassuring, this does not definitively rule out a hereditary predisposition to cancer. It is still possible that there could be genetic mutations that are undetectable by current technology. There could be genetic mutations in genes that have not been tested or identified to increase cancer risk.  Therefore, it is recommended she continue to follow the cancer management and screening guidelines provided by her oncology and primary healthcare provider.   An individual's cancer risk and medical management are not determined by genetic test results alone. Overall cancer risk assessment incorporates additional factors, including personal medical history, family history, and any available genetic information that may result in a personalized  plan for cancer prevention and surveillance  RECOMMENDATIONS FOR FAMILY MEMBERS:  Individuals in this family might be at some increased risk of developing cancer, over the general population risk, simply due to the family history of cancer.  We recommended women in this family have a yearly mammogram beginning at age 21, or 66 years younger than the  earliest onset of cancer, an annual clinical breast exam, and perform monthly breast self-exams. Women in this family should also have a gynecological exam as recommended by their primary provider. All family members should be referred for colonoscopy starting at age 34.  FOLLOW-UP: Lastly, we discussed with Ms. Swango that cancer genetics is a rapidly advancing field and it is possible that new genetic tests will be appropriate for her and/or her family members in the future. We encouraged her to remain in contact with cancer genetics on an regular basis so we can update her personal and family histories and let her know of advances in cancer genetics that may benefit this family.   Our contact number was provided. Ms. Remsen questions were answered to her satisfaction, and she knows she is welcome to call us at anytime with additional questions or concerns.    Manahil Vanzile M. Joette Catching, [place], [facility] Certified Film/video [email or web address] (P) [phone]

## 2020-08-04 ENCOUNTER — Telehealth (HOSPITAL_COMMUNITY): Payer: Self-pay

## 2020-08-04 NOTE — Telephone Encounter (Signed)
Nutrition Assessment   Reason for Assessment:  Patient identified on Malnutrition Screening report for weight loss and poor appetite   ASSESSMENT:  64 year old female with ovarian carcinoma.  Patient receiving neoadjuvant chemotherapy.  Past medical history of COPD on oxygen, HTN, HLD.  Spoke with patient via phone and introduced self and service at [facility].  Patient reports that her appetite has decreased.  She normally gets up late and have eggs and bacon or cereal for brunch/lunch.  She may have pot pie of TV dinner in afternoon and meat and vegetables for dinner.  Drinks carnation instant breakfast shakes made with whole milk and ice cream.  Likes to snack on ice cream, potato chips, nabs, cookies.  Report constipation and stomach pain with whatever she eats.     Medications: dulcolax, zofran, predinsone, protonix, fe sulfate, compazine   Labs: reviewed   Anthropometrics:   Height: 60 inches Weight: 119 lb  125 lb on 06/07/20 BMI: 23  5% weight loss in the last 2 months, concernining   Estimated Energy Needs  Kcals: 1600-1800 Protein: 80-90 g Fluid: 1.6 L   NUTRITION DIAGNOSIS: Unintentional weight loss related to cancer related treatment side effects as evidenced by 5% weight loss in 2 months and poor appetite   INTERVENTION:  Reviewed ways to add calories and protein in diet.  Will mail handout and shake recipes. Encouraged carnation breakfast essentials shake, daily if able Encouraged patient to reach out to MD if laxative is not helping with constipation Will mail contact information   MONITORING, EVALUATION, GOAL: weight trends, intake   Next Visit: Oct 22 after injection  [name] B. Zenia Resides, [place], [place] Registered Dietitian [phone] (mobile)

## 2020-08-08 ENCOUNTER — Encounter (HOSPITAL_COMMUNITY): Payer: Self-pay | Admitting: General Practice

## 2020-08-08 NOTE — Progress Notes (Signed)
[facility] CSW Progress Notes  Application received from patient for [facility].  Faxed to agency at [phone].  Edwyna Shell, LCSW Clinical Social Worker Phone:  [phone]

## 2020-08-10 ENCOUNTER — Telehealth (HOSPITAL_COMMUNITY): Payer: Self-pay | Admitting: Hematology

## 2020-08-10 NOTE — Telephone Encounter (Signed)
Enrolled pt into our pt assist program. Pt will be awarded a S1,000 grant. Pt dropped of fin docs and bills (rent, car insurance,energy)  on 9/21. I called pt and left a vm letting her know we need a lease agreement and a W9 in order to pay her rent and we cant pay for car insurance. Submitted the [employer] for payment.

## 2020-08-16 ENCOUNTER — Inpatient Hospital Stay (HOSPITAL_COMMUNITY): Payer: Medicare HMO

## 2020-08-16 ENCOUNTER — Other Ambulatory Visit: Payer: Self-pay

## 2020-08-16 ENCOUNTER — Inpatient Hospital Stay (HOSPITAL_BASED_OUTPATIENT_CLINIC_OR_DEPARTMENT_OTHER): Payer: Medicare HMO | Admitting: Hematology

## 2020-08-16 VITALS — BP 120/61 | HR 92 | Temp 97.1°F | Resp 18

## 2020-08-16 VITALS — BP 118/56 | HR 88 | Temp 97.3°F | Resp 20 | Wt 117.3 lb

## 2020-08-16 DIAGNOSIS — C569 Malignant neoplasm of unspecified ovary: Secondary | ICD-10-CM

## 2020-08-16 DIAGNOSIS — C786 Secondary malignant neoplasm of retroperitoneum and peritoneum: Secondary | ICD-10-CM

## 2020-08-16 DIAGNOSIS — Z95828 Presence of other vascular implants and grafts: Secondary | ICD-10-CM

## 2020-08-16 DIAGNOSIS — Z5111 Encounter for antineoplastic chemotherapy: Secondary | ICD-10-CM | POA: Diagnosis not present

## 2020-08-16 LAB — COMPREHENSIVE METABOLIC PANEL
ALT: 8 U/L (ref 0–44)
AST: 17 U/L (ref 15–41)
Albumin: 3.3 g/dL — ABNORMAL LOW (ref 3.5–5.0)
Alkaline Phosphatase: 71 U/L (ref 38–126)
Anion gap: 8 (ref 5–15)
BUN: 7 mg/dL — ABNORMAL LOW (ref 8–23)
CO2: 30 mmol/L (ref 22–32)
Calcium: 8.5 mg/dL — ABNORMAL LOW (ref 8.9–10.3)
Chloride: 99 mmol/L (ref 98–111)
Creatinine, Ser: 0.47 mg/dL (ref 0.44–1.00)
GFR calc Af Amer: >60 mL/min (ref >60)
GFR calc non Af Amer: >60 mL/min (ref >60)
Glucose, Bld: 104 mg/dL — ABNORMAL HIGH (ref 70–99)
Potassium: 3.5 mmol/L (ref 3.5–5.1)
Sodium: 137 mmol/L (ref 135–145)
Total Bilirubin: 0.5 mg/dL (ref 0.3–1.2)
Total Protein: 6.3 g/dL — ABNORMAL LOW (ref 6.5–8.1)

## 2020-08-16 LAB — CBC WITH DIFFERENTIAL/PLATELET
Abs Immature Granulocytes: 0.02 10*3/uL (ref 0.00–0.07)
Basophils Absolute: 0.1 10*3/uL (ref 0.0–0.1)
Basophils Relative: 1 %
Eosinophils Absolute: 0.1 10*3/uL (ref 0.0–0.5)
Eosinophils Relative: 2 %
HCT: 26.6 % — ABNORMAL LOW (ref 36.0–46.0)
Hemoglobin: 8.4 g/dL — ABNORMAL LOW (ref 12.0–15.0)
Immature Granulocytes: 1 %
Lymphocytes Relative: 20 %
Lymphs Abs: 0.9 10*3/uL (ref 0.7–4.0)
MCH: 29 pg (ref 26.0–34.0)
MCHC: 31.6 g/dL (ref 30.0–36.0)
MCV: 91.7 fL (ref 80.0–100.0)
Monocytes Absolute: 0.5 10*3/uL (ref 0.1–1.0)
Monocytes Relative: 11 %
Neutro Abs: 2.9 10*3/uL (ref 1.7–7.7)
Neutrophils Relative %: 65 %
Platelets: 472 10*3/uL — ABNORMAL HIGH (ref 150–400)
RBC: 2.9 MIL/uL — ABNORMAL LOW (ref 3.87–5.11)
RDW: 15.9 % — ABNORMAL HIGH (ref 11.5–15.5)
WBC: 4.4 10*3/uL (ref 4.0–10.5)
nRBC: 0 % (ref 0.0–0.2)

## 2020-08-16 MED ORDER — PALONOSETRON HCL INJECTION 0.25 MG/5ML
0.2500 mg | Freq: Once | INTRAVENOUS | Status: AC
  Administered 2020-08-16: 0.25 mg via INTRAVENOUS

## 2020-08-16 MED ORDER — FAMOTIDINE IN NACL 20-0.9 MG/50ML-% IV SOLN
INTRAVENOUS | Status: AC
  Filled 2020-08-16: qty 50

## 2020-08-16 MED ORDER — SODIUM CHLORIDE 0.9 % IV SOLN
140.0000 mg/m2 | Freq: Once | INTRAVENOUS | Status: AC
  Administered 2020-08-16: 216 mg via INTRAVENOUS
  Filled 2020-08-16: qty 36

## 2020-08-16 MED ORDER — SODIUM CHLORIDE 0.9 % IV SOLN
538.8000 mg | Freq: Once | INTRAVENOUS | Status: AC
  Administered 2020-08-16: 540 mg via INTRAVENOUS
  Filled 2020-08-16: qty 54

## 2020-08-16 MED ORDER — SODIUM CHLORIDE 0.9 % IV SOLN
150.0000 mg | Freq: Once | INTRAVENOUS | Status: AC
  Administered 2020-08-16: 150 mg via INTRAVENOUS
  Filled 2020-08-16: qty 150

## 2020-08-16 MED ORDER — DIPHENHYDRAMINE HCL 50 MG/ML IJ SOLN
50.0000 mg | Freq: Once | INTRAMUSCULAR | Status: AC
  Administered 2020-08-16: 50 mg via INTRAVENOUS

## 2020-08-16 MED ORDER — SODIUM CHLORIDE 0.9% FLUSH
10.0000 mL | INTRAVENOUS | Status: DC | PRN
  Administered 2020-08-16 (×2): 10 mL

## 2020-08-16 MED ORDER — PALONOSETRON HCL INJECTION 0.25 MG/5ML
INTRAVENOUS | Status: AC
  Filled 2020-08-16: qty 5

## 2020-08-16 MED ORDER — HEPARIN SOD (PORK) LOCK FLUSH 100 UNIT/ML IV SOLN
500.0000 [IU] | Freq: Once | INTRAVENOUS | Status: AC | PRN
  Administered 2020-08-16: 500 [IU]

## 2020-08-16 MED ORDER — SODIUM CHLORIDE 0.9 % IV SOLN
10.0000 mg | Freq: Once | INTRAVENOUS | Status: AC
  Administered 2020-08-16: 10 mg via INTRAVENOUS
  Filled 2020-08-16: qty 10

## 2020-08-16 MED ORDER — SODIUM CHLORIDE 0.9 % IV SOLN: Freq: Once | INTRAVENOUS | Status: AC

## 2020-08-16 MED ORDER — FAMOTIDINE IN NACL 20-0.9 MG/50ML-% IV SOLN
20.0000 mg | Freq: Once | INTRAVENOUS | Status: AC
  Administered 2020-08-16: 20 mg via INTRAVENOUS

## 2020-08-16 MED ORDER — DIPHENHYDRAMINE HCL 50 MG/ML IJ SOLN
INTRAMUSCULAR | Status: AC
  Filled 2020-08-16: qty 1

## 2020-08-16 NOTE — Progress Notes (Signed)
Labs reviewed with Dr Raliegh Ip, okay to proceed with treatment.   Tolerated treatment well today.  Vital signs stable prior to discharge.  Stable to be discharged home via wheelchair.

## 2020-08-16 NOTE — Progress Notes (Signed)
[place], [place] [address]   CLINIC:  Medical Oncology/Hematology  PCP:  Ranae Plumber, [place] / [place] [address] [phone]   REASON FOR VISIT:  Follow-up for high-grade serous ovarian carcinoma  PRIOR THERAPY: None  NGS Results: BRCA 1/2 negative  CURRENT THERAPY: Carboplatin and paclitaxel every 3 weeks  BRIEF ONCOLOGIC HISTORY:  Oncology History  Peritoneal carcinomatosis ([place])  06/06/2020 Initial Diagnosis   Peritoneal carcinomatosis ([place])   07/05/2020 -  Chemotherapy   The patient had palonosetron (ALOXI) injection 0.25 mg, 0.25 mg, Intravenous,  Once, 2 of 6 cycles Administration: 0.25 mg (07/05/2020), 0.25 mg (07/26/2020) pegfilgrastim-cbqv (UDENYCA) injection 6 mg, 6 mg, Subcutaneous, Once, 2 of 6 cycles Administration: 6 mg (07/07/2020), 6 mg (07/28/2020) CARBOplatin (PARAPLATIN) 540 mg in sodium chloride 0.9 % 250 mL chemo infusion, 540 mg (100 % of original dose 543.6 mg), Intravenous,  Once, 2 of 6 cycles Dose modification:   (original dose 543.6 mg, Cycle 1), 540 mg (original dose 540 mg, Cycle 2),   (Cycle 3) Administration: 540 mg (07/05/2020), 540 mg (07/26/2020) fosaprepitant (EMEND) 150 mg in sodium chloride 0.9 % 145 mL IVPB, 150 mg, Intravenous,  Once, 2 of 6 cycles Administration: 150 mg (07/05/2020), 150 mg (07/26/2020) PACLitaxel (TAXOL) 216 mg in sodium chloride 0.9 % 250 mL chemo infusion (> 30m/m2), 140 mg/m2 = 216 mg (80 % of original dose 175 mg/m2), Intravenous,  Once, 2 of 6 cycles Dose modification: 140 mg/m2 (80 % of original dose 175 mg/m2, Cycle 1, Reason: Other (see comments), Comment: neuropathy) Administration: 216 mg (07/05/2020), 216 mg (07/26/2020)  for chemotherapy treatment.    07/22/2020 Genetic Testing   Negative genetic testing: no pathogenic variants detected in [facility] Expanded CancerNext Panel + RNAInsight. The CancerNext-Expanded gene panel [facility] by [facility] includes  sequencing and rearrangement analysis for the following 77 genes: AIP, ALK, APC*, ATM*, AXIN2, BAP1, BARD1, BLM, BMPR1A, BRCA1*, BRCA2*, BRIP1*, CDC73, CDH1*, CDK4, CDKN1B, CDKN2A, CHEK2*, CTNNA1, DICER1, FANCC, FH, FLCN, GALNT12, KIF1B, LZTR1, MAX, MEN1, MET, MLH1*, MSH2*, MSH3, MSH6*, MUTYH*, NBN, NF1*, NF2, NTHL1, PALB2*, PHOX2B, PMS2*, POT1, PRKAR1A, PTCH1, PTEN*, RAD51C*, RAD51D*, RB1, RECQL, RET, SDHA, SDHAF2, SDHB, SDHC, SDHD, SMAD4, SMARCA4, SMARCB1, SMARCE1, STK11, SUFU, TMEM127, TP53*, TSC1, TSC2, VHL and XRCC2 (sequencing and deletion/duplication); EGFR, EGLN1, HOXB13, KIT, MITF, PDGFRA, POLD1, and POLE (sequencing only); EPCAM and GREM1 (deletion/duplication only). DNA and RNA analyses performed for * genes. The report date is July 22, 2020.      CANCER STAGING: Cancer Staging No matching staging information was found for the patient.  INTERVAL HISTORY:  [name] a 64y.o. female, returns for routine follow-up and consideration for next cycle of chemotherapy. DArleanwas last seen on 07/26/2020.  Due for cycle #3 of carboplatin and paclitaxel today.   Today she is accompanied by her husband. Overall, she tells me she has been feeling pretty well. She tolerated the previous treatment well and denies having N/V/D or constipation. She continues feeling fatigued if she exerts herself, though she is doing better overall. Her neuropathy is stable and she takes gabapentin daily. She denies abdominal pain or leg swelling. She continues receiving oxygen via [place] and is currently on prednisone 5 mg daily.  Overall, she feels ready for next cycle of chemo today.    REVIEW OF SYSTEMS:  Review of Systems  Constitutional: Positive for appetite change (50%) and fatigue (60%).  Respiratory: Positive for cough (d/t COPD)  and shortness of breath (d/t COPD).   Cardiovascular: Negative for leg swelling.  Gastrointestinal: Negative for abdominal pain, constipation, diarrhea, nausea and  vomiting.  Neurological: Positive for numbness (stable).  All other systems reviewed and are negative.   PAST MEDICAL/SURGICAL HISTORY:  Past Medical History:  Diagnosis Date  . Anemia   . Asthma   . Cancer [facility])    Gastric Cancer  . COPD (chronic obstructive pulmonary disease) ([place])   . High cholesterol   . Hypertension   . Neuropathy   . Pneumonia 2019  . Port-A-Cath in place 07/03/2020   Past Surgical History:  Procedure Laterality Date  . HALLUX VALGUS BASE WEDGE Right 06/09/2015   Procedure: Base wedge osteotomy with modified McBride right foot ;  Surgeon: Sharlotte Alamo, MD;  Location: [facility] ORS;  Service: Podiatry;  Laterality: Right;  . PORTACATH PLACEMENT Left 06/28/2020   Procedure: PORT-A-CATHETER PLACEMENT LEFT CHEST (attached catheter in left subclavian);  Surgeon: Virl Cagey, MD;  Location: [facility]S;  Service: General;  Laterality: Left;    SOCIAL HISTORY:  Social History   Socioeconomic History  . Marital status: Divorced    Spouse name: Not on file  . Number of children: 3  . Years of education: Not on file  . Highest education level: Not on file  Occupational History  . Occupation: DISABLED  Tobacco Use  . Smoking status: Former Smoker    Packs/day: 2.00    Years: 30.00    Pack years: 60.00    Types: Cigarettes    Quit date: 11/04/2013    Years since quitting: 6.7  . Smokeless tobacco: Never Used  Vaping Use  . Vaping Use: Never used  Substance and Sexual Activity  . Alcohol use: No  . Drug use: No  . Sexual activity: Not Currently  Other Topics Concern  . Not on file  Social History Narrative  . Not on file   Social Determinants of Health   Financial Resource Strain: Medium Risk  . Difficulty of Paying Living Expenses: Somewhat hard  Food Insecurity: No Food Insecurity  . Worried About Charity fundraiser in the Last Year: Never true  . Ran Out of Food in the Last Year: Never true  Transportation Needs: No Transportation Needs  . Lack  of Transportation (Medical): No  . Lack of Transportation (Non-Medical): No  Physical Activity: Inactive  . Days of Exercise per Week: 0 days  . Minutes of Exercise per Session: 0 min  Stress: Stress Concern Present  . Feeling of Stress : To some extent  Social Connections: Moderately Isolated  . Frequency of Communication with Friends and Family: More than three times a week  . Frequency of Social Gatherings with Friends and Family: Never  . Attends Religious Services: More than 4 times per year  . Active Member of Clubs or Organizations: No  . Attends Archivist Meetings: Never  . Marital Status: Divorced  Human resources officer Violence: Not At Risk  . Fear of Current or Ex-Partner: No  . Emotionally Abused: No  . Physically Abused: No  . Sexually Abused: No    FAMILY HISTORY:  Family History  Problem Relation Age of Onset  . Alzheimer's disease Mother   . COPD Father   . Emphysema Father   . Hypertension Father   . Healthy Sister   . Healthy Brother   . Alzheimer's disease Maternal Grandmother   . Healthy Sister   . Healthy Sister   . Healthy Sister   .  Prostate cancer Other        paternal grandmother's brother; dx in early 61s  . Breast cancer Neg Hx     CURRENT MEDICATIONS:  Current Outpatient Medications  Medication Sig Dispense Refill  . albuterol (PROVENTIL HFA;VENTOLIN HFA) 108 (90 BASE) MCG/ACT inhaler Inhale 4-6 puffs into the lungs every 6 (six) hours as needed for wheezing or shortness of breath.     Marland Kitchen amLODipine (NORVASC) 5 MG tablet Take 5 mg by mouth at bedtime.     Marland Kitchen aspirin EC 81 MG tablet Take 81 mg by mouth daily.    Marland Kitchen CARBOPLATIN IV Inject into the vein every 21 ( twenty-one) days.    Marland Kitchen docusate sodium (COLACE) 100 MG capsule Take 1 capsule (100 mg total) by mouth 2 (two) times daily as needed for mild constipation. 60 capsule 0  . ferrous sulfate 325 (65 FE) MG tablet Take 325 mg by mouth 2 (two) times daily with a meal.     .  Fluticasone-Umeclidin-Vilant (TRELEGY ELLIPTA) 100-62.5-25 MCG/INH AEPB Inhale into the lungs.    . gabapentin (NEURONTIN) 300 MG capsule Take 300 mg by mouth 3 (three) times daily.     Marland Kitchen ipratropium (ATROVENT HFA) 17 MCG/ACT inhaler Inhale 2 puffs into the lungs every 6 (six) hours as needed for wheezing.     Marland Kitchen ipratropium-albuterol (DUONEB) 0.5-2.5 (3) MG/3ML SOLN Take 0.5 mg by nebulization every 6 (six) hours as needed.     . nortriptyline (PAMELOR) 25 MG capsule Take 25 mg by mouth at bedtime.    . ondansetron (ZOFRAN ODT) 4 MG disintegrating tablet 25m ODT q4 hours prn nausea/vomit 12 tablet 0  . oxyCODONE-acetaminophen (PERCOCET/ROXICET) 5-325 MG tablet Take 1 tablet by mouth every 4 (four) hours as needed for severe pain. 30 tablet 0  . OXYGEN Inhale 3 L into the lungs continuous.     .Marland KitchenPACLITAXEL IV Inject into the vein every 21 ( twenty-one) days.    . pantoprazole (PROTONIX) 40 MG tablet Take 1 tablet (40 mg total) by mouth daily. 30 tablet 2  . predniSONE (DELTASONE) 5 MG tablet Take 5 mg by mouth daily with breakfast.    . simvastatin (ZOCOR) 40 MG tablet Take 40 mg by mouth at bedtime.    .Marland Kitchenacetylcysteine (MUCOMYST) 20 % nebulizer solution Inhale into the lungs. (Patient not taking: Reported on 08/16/2020)    . lidocaine-prilocaine (EMLA) cream Apply a small amount to port a cath site and cover with plastic wrap 1 hour prior to chemotherapy appointments (Patient not taking: Reported on 08/16/2020) 30 g 3  . prochlorperazine (COMPAZINE) 10 MG tablet Take 1 tablet (10 mg total) by mouth every 6 (six) hours as needed (Nausea or vomiting). (Patient not taking: Reported on 08/16/2020) 30 tablet 1   No current facility-administered medications for this visit.    ALLERGIES:  No Known Allergies  PHYSICAL EXAM:  Performance status (ECOG): 1 - Symptomatic but completely ambulatory  Vitals:   08/16/20 0813  BP: (!) 118/56  Pulse: 88  Resp: 20  Temp: (!) 97.3 F (36.3 C)  SpO2: 97%    Wt Readings from Last 3 Encounters:  08/16/20 117 lb 4.6 oz (53.2 kg)  07/26/20 119 lb 9.6 oz (54.3 kg)  07/13/20 119 lb 3.2 oz (54.1 kg)   Physical Exam Vitals reviewed.  Constitutional:      Appearance: Normal appearance.     Interventions: Nasal cannula in place.  Cardiovascular:     Rate and Rhythm: Normal rate  and regular rhythm.     Pulses: Normal pulses.     Heart sounds: Normal heart sounds.  Pulmonary:     Effort: Pulmonary effort is normal.     Breath sounds: Normal breath sounds.  Chest:     Comments: Port-a-Cath in L chest Abdominal:     Palpations: Abdomen is soft. There is no mass.     Tenderness: There is no abdominal tenderness.     Hernia: No hernia is present.  Musculoskeletal:     Right lower leg: No edema.     Left lower leg: No edema.  Neurological:     General: No focal deficit present.     Mental Status: She is alert and oriented to person, place, and time.  Psychiatric:        Mood and Affect: Mood normal.        Behavior: Behavior normal.     LABORATORY DATA:  I have reviewed the labs as listed.  CBC Latest Ref Rng & Units 08/16/2020 07/26/2020 07/13/2020  WBC 4.0 - 10.5 K/uL 4.4 4.5 27.3(H)  Hemoglobin 12.0 - 15.0 g/dL 8.4(L) 8.7(L) 7.0(L)  Hematocrit 36 - 46 % 26.6(L) 27.4(L) 21.6(L)  Platelets 150 - 400 K/uL 472(H) 555(H) 327   CMP Latest Ref Rng & Units 08/16/2020 07/26/2020 07/13/2020  Glucose 70 - 99 mg/dL 104(H) 111(H) 105(H)  BUN 8 - 23 mg/dL 7(L) <5(L) 7(L)  Creatinine 0.44 - 1.00 mg/dL 0.47 0.49 0.50  Sodium 135 - 145 mmol/L 137 140 130(L)  Potassium 3.5 - 5.1 mmol/L 3.5 3.6 3.2(L)  Chloride 98 - 111 mmol/L 99 100 87(L)  CO2 22 - 32 mmol/L 30 29 33(H)  Calcium 8.9 - 10.3 mg/dL 8.5(L) 8.5(L) 8.0(L)  Total Protein 6.5 - 8.1 g/dL 6.3(L) 5.9(L) 5.4(L)  Total Bilirubin 0.3 - 1.2 mg/dL 0.5 0.4 0.2(L)  Alkaline Phos 38 - 126 U/L 71 78 100  AST 15 - 41 U/L '17 17 26  ' ALT 0 - 44 U/L '8 9 12    ' DIAGNOSTIC IMAGING:  I have independently  reviewed the scans and discussed with the patient. No results found.   ASSESSMENT:  1. Peritoneal carcinomatosis: -Presentation to the ER with right lower quadrant abdominal pain on and off for 1 month. -CTAP on 05/29/2020 showed extensive nodularity throughout the omentum and upper peritoneal cavity. Both ovaries are prominent although no well-defined mass is noted.Small volume ascites. -CA-125 is elevated at 2407. CEA was 2.4. -CT of the chest shows 11 mm right paratracheal lymph node and 11 mm short axis precarinal lymph node. Subcarinal lymphadenopathy measuring 14 mm short axis. This is suspicious for metastatic disease. 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 -Cycle 1 of carboplatin and paclitaxel on 07/05/2020. -Evaluated by Dr. Denman George on 07/07/2020. Reevaluation after 3 cycles. -Possible surgical resection after 6 cycles.  2. COPD: -Quit smoking 6 years ago. Smoked 2 packs/day for 25-30 years. -Has been on3 L/min oxygen via nasal cannulafor the last 5 years.  3. Family history: -Paternal grandmother with colon cancer.   PLAN:  1.High-grade serous ovarian carcinoma: -Tolerated cycle 2 very well.  Reviewed labs today.  LFTs are normal.  White count is 4.4 with normal ANC.  Platelets are normal.  Hemoglobin is 8.4. -We will proceed with cycle 3.  Paclitaxel is dose reduced at 140 mg per metered square. -RTC 3 weeks.  We will plan for CT CAP prior  to next visit.  2. Family history: -BRCA1/2 results were negative.  3. Peripheral neuropathy: -Continue gabapentin 300 mg 3 times daily.  No worsening since paclitaxel.  4. Hypokalemia: -Continue potassium 40 mill equivalents daily.  5.  Severe COPD: -Continue prednisone 5 mg daily. -Continue Trelegy, Xopenex.  She has trouble finding Mucomyst 20% which was  prescribed by Dr. Raul Del to be taken with twice daily with albuterol neb laser.   Orders placed this encounter:  Orders Placed This Encounter  Procedures  . CT CHEST ABDOMEN PELVIS W CONTRAST  . CA [place], MD [facility] [phone]   I, Milinda Antis, am acting as a scribe for Dr. Sanda Linger.  I, Derek Jack MD, have reviewed the above documentation for accuracy and completeness, and I agree with the above.

## 2020-08-16 NOTE — Patient Instructions (Addendum)
[place] at [facility] Discharge Instructions  You were seen today by [name]. He went over your recent results. You received your treatment today. Continue eating a protein-dense diet daily. You will be scheduled for a CT scan of your chest and abdomen before your next visit. [name] will see you back in 3 weeks for labs and follow up.   Thank you for choosing [place] at [facility] to provide your oncology and hematology care.  To afford each patient quality time with our provider, please arrive at least 15 minutes before your scheduled appointment time.   If you have a lab appointment with the [facility] please come in thru the Main Entrance and check in at the main information desk  You need to re-schedule your appointment should you arrive 10 or more minutes late.  We strive to give you quality time with our providers, and arriving late affects you and other patients whose appointments are after yours.  Also, if you no show three or more times for appointments you may be dismissed from the clinic at the providers discretion.     Again, thank you for choosing [facility].  Our hope is that these requests will decrease the amount of time that you wait before being seen by our physicians.       _____________________________________________________________  Should you have questions after your visit to [facility], please contact our office at [phone] between the hours of 8:00 a.m. and 4:30 p.m.  Voicemails left after 4:00 p.m. will not be returned until the following business day.  For prescription refill requests, have your pharmacy contact our office and allow 72 hours.    Cancer Center Support Programs:   > Cancer Support Group  2nd Tuesday of the month 1pm-2pm, Journey Room

## 2020-08-16 NOTE — Patient Instructions (Signed)
[facility] Discharge Instructions for Patients Receiving Chemotherapy  Today you received the following chemotherapy agents   To help prevent nausea and vomiting after your treatment, we encourage you to take your nausea medication   If you develop nausea and vomiting that is not controlled by your nausea medication, call the clinic.   BELOW ARE SYMPTOMS THAT SHOULD BE REPORTED IMMEDIATELY:  *FEVER GREATER THAN 100.5 F  *CHILLS WITH OR WITHOUT FEVER  NAUSEA AND VOMITING THAT IS NOT CONTROLLED WITH YOUR NAUSEA MEDICATION  *UNUSUAL SHORTNESS OF BREATH  *UNUSUAL BRUISING OR BLEEDING  TENDERNESS IN MOUTH AND THROAT WITH OR WITHOUT PRESENCE OF ULCERS  *URINARY PROBLEMS  *BOWEL PROBLEMS  UNUSUAL RASH Items with * indicate a potential emergency and should be followed up as soon as possible.  Feel free to call the clinic should you have any questions or concerns. The clinic phone number is [phone].  Please show the CHEMO ALERT CARD at check-in to the Emergency Department and triage nurse.   

## 2020-08-16 NOTE — Patient Instructions (Signed)
[facility] at [facility] Discharge Instructions  Labs drawn from portacath today   Thank you for choosing [facility] at [facility] to provide your oncology and hematology care.  To afford each patient quality time with our provider, please arrive at least 15 minutes before your scheduled appointment time.   If you have a lab appointment with the Cancer Center please come in thru the Main Entrance and check in at the main information desk.  You need to re-schedule your appointment should you arrive 10 or more minutes late.  We strive to give you quality time with our providers, and arriving late affects you and other patients whose appointments are after yours.  Also, if you no show three or more times for appointments you may be dismissed from the clinic at the providers discretion.     Again, thank you for choosing [facility].  Our hope is that these requests will decrease the amount of time that you wait before being seen by our physicians.       _____________________________________________________________  Should you have questions after your visit to [facility], please contact our office at [phone] and follow the prompts.  Our office hours are 8:00 a.m. and 4:30 p.m. Monday - Friday.  Please note that voicemails left after 4:00 p.m. may not be returned until the following business day.  We are closed weekends and major holidays.  You do have access to a nurse 24-7, just call the main number to the clinic [phone] and do not press any options, hold on the line and a nurse will answer the phone.    For prescription refill requests, have your pharmacy contact our office and allow 72 hours.    Due to Covid, you will need to wear a mask upon entering the hospital. If you do not have a mask, a mask will be given to you at the Main Entrance upon arrival. For doctor visits, patients may have 1 support person age 18  or older with them. For treatment visits, patients can not have anyone with them due to social distancing guidelines and our immunocompromised population.     

## 2020-08-17 ENCOUNTER — Encounter: Payer: Self-pay | Admitting: General Practice

## 2020-08-17 NOTE — Progress Notes (Signed)
[place] CSW Progress Notes  Referral faxed to [facility] for financial assistance at patient request.  Fax confirmation received.  Edwyna Shell, LCSW Clinical Social Worker Phone:  [phone]

## 2020-08-18 ENCOUNTER — Other Ambulatory Visit: Payer: Self-pay

## 2020-08-18 ENCOUNTER — Inpatient Hospital Stay (HOSPITAL_COMMUNITY): Payer: Medicare HMO | Attending: Hematology

## 2020-08-18 VITALS — BP 96/61 | HR 101 | Temp 97.2°F | Resp 18

## 2020-08-18 DIAGNOSIS — D649 Anemia, unspecified: Secondary | ICD-10-CM | POA: Insufficient documentation

## 2020-08-18 DIAGNOSIS — Z8042 Family history of malignant neoplasm of prostate: Secondary | ICD-10-CM | POA: Insufficient documentation

## 2020-08-18 DIAGNOSIS — G629 Polyneuropathy, unspecified: Secondary | ICD-10-CM | POA: Insufficient documentation

## 2020-08-18 DIAGNOSIS — Z7982 Long term (current) use of aspirin: Secondary | ICD-10-CM | POA: Insufficient documentation

## 2020-08-18 DIAGNOSIS — Z95828 Presence of other vascular implants and grafts: Secondary | ICD-10-CM

## 2020-08-18 DIAGNOSIS — Z87891 Personal history of nicotine dependence: Secondary | ICD-10-CM | POA: Diagnosis not present

## 2020-08-18 DIAGNOSIS — E876 Hypokalemia: Secondary | ICD-10-CM | POA: Diagnosis not present

## 2020-08-18 DIAGNOSIS — Z5189 Encounter for other specified aftercare: Secondary | ICD-10-CM | POA: Insufficient documentation

## 2020-08-18 DIAGNOSIS — Z79899 Other long term (current) drug therapy: Secondary | ICD-10-CM | POA: Insufficient documentation

## 2020-08-18 DIAGNOSIS — C786 Secondary malignant neoplasm of retroperitoneum and peritoneum: Secondary | ICD-10-CM | POA: Diagnosis present

## 2020-08-18 DIAGNOSIS — J449 Chronic obstructive pulmonary disease, unspecified: Secondary | ICD-10-CM | POA: Insufficient documentation

## 2020-08-18 DIAGNOSIS — I10 Essential (primary) hypertension: Secondary | ICD-10-CM | POA: Insufficient documentation

## 2020-08-18 DIAGNOSIS — Z5111 Encounter for antineoplastic chemotherapy: Secondary | ICD-10-CM | POA: Diagnosis not present

## 2020-08-18 DIAGNOSIS — C569 Malignant neoplasm of unspecified ovary: Secondary | ICD-10-CM | POA: Insufficient documentation

## 2020-08-18 DIAGNOSIS — Z8249 Family history of ischemic heart disease and other diseases of the circulatory system: Secondary | ICD-10-CM | POA: Diagnosis not present

## 2020-08-18 DIAGNOSIS — Z800 Family history of malignant neoplasm of digestive organs: Secondary | ICD-10-CM | POA: Insufficient documentation

## 2020-08-18 MED ORDER — PEGFILGRASTIM-CBQV 6 MG/0.6ML ~~LOC~~ SOSY
6.0000 mg | PREFILLED_SYRINGE | Freq: Once | SUBCUTANEOUS | Status: AC
  Administered 2020-08-18: 6 mg via SUBCUTANEOUS
  Filled 2020-08-18: qty 0.6

## 2020-08-18 NOTE — Progress Notes (Signed)
Patient tolerated Udenyca injection with no complaints voiced.  Site clean and dry with no bruising or swelling noted.  No complaints of pain.  Discharged with vital signs stable and no signs or symptoms of distress noted.  

## 2020-09-01 ENCOUNTER — Other Ambulatory Visit: Payer: Self-pay

## 2020-09-01 ENCOUNTER — Ambulatory Visit (HOSPITAL_COMMUNITY)
Admission: RE | Admit: 2020-09-01 | Discharge: 2020-09-01 | Disposition: A | Discharge status: Home / Self Care | Payer: Medicare HMO | Source: Ambulatory Visit | Attending: Hematology | Admitting: Hematology

## 2020-09-01 DIAGNOSIS — R599 Enlarged lymph nodes, unspecified: Secondary | ICD-10-CM | POA: Diagnosis not present

## 2020-09-01 DIAGNOSIS — I2510 Atherosclerotic heart disease of native coronary artery without angina pectoris: Secondary | ICD-10-CM | POA: Insufficient documentation

## 2020-09-01 DIAGNOSIS — R911 Solitary pulmonary nodule: Secondary | ICD-10-CM | POA: Diagnosis not present

## 2020-09-01 DIAGNOSIS — I700 Atherosclerosis of aorta: Secondary | ICD-10-CM | POA: Diagnosis not present

## 2020-09-01 DIAGNOSIS — K449 Diaphragmatic hernia without obstruction or gangrene: Secondary | ICD-10-CM | POA: Insufficient documentation

## 2020-09-01 DIAGNOSIS — J439 Emphysema, unspecified: Secondary | ICD-10-CM | POA: Insufficient documentation

## 2020-09-01 DIAGNOSIS — C569 Malignant neoplasm of unspecified ovary: Secondary | ICD-10-CM | POA: Diagnosis present

## 2020-09-01 DIAGNOSIS — K5730 Diverticulosis of large intestine without perforation or abscess without bleeding: Secondary | ICD-10-CM | POA: Diagnosis not present

## 2020-09-01 DIAGNOSIS — D259 Leiomyoma of uterus, unspecified: Secondary | ICD-10-CM | POA: Diagnosis not present

## 2020-09-01 MED ORDER — IOHEXOL 300 MG/ML  SOLN
80.0000 mL | Freq: Once | INTRAMUSCULAR | Status: AC | PRN
  Administered 2020-09-01: 80 mL via INTRAVENOUS

## 2020-09-06 ENCOUNTER — Inpatient Hospital Stay (HOSPITAL_BASED_OUTPATIENT_CLINIC_OR_DEPARTMENT_OTHER): Payer: Medicare HMO | Admitting: Hematology

## 2020-09-06 ENCOUNTER — Other Ambulatory Visit: Payer: Self-pay

## 2020-09-06 ENCOUNTER — Inpatient Hospital Stay (HOSPITAL_COMMUNITY): Payer: Medicare HMO

## 2020-09-06 VITALS — BP 142/61 | HR 99 | Temp 97.5°F | Resp 18

## 2020-09-06 VITALS — BP 117/58 | HR 94 | Temp 97.0°F | Resp 20 | Wt 116.1 lb

## 2020-09-06 DIAGNOSIS — Z5111 Encounter for antineoplastic chemotherapy: Secondary | ICD-10-CM | POA: Diagnosis not present

## 2020-09-06 DIAGNOSIS — Z95828 Presence of other vascular implants and grafts: Secondary | ICD-10-CM

## 2020-09-06 DIAGNOSIS — C786 Secondary malignant neoplasm of retroperitoneum and peritoneum: Secondary | ICD-10-CM

## 2020-09-06 DIAGNOSIS — C569 Malignant neoplasm of unspecified ovary: Secondary | ICD-10-CM

## 2020-09-06 LAB — CBC WITH DIFFERENTIAL/PLATELET
Abs Immature Granulocytes: 0.02 10*3/uL (ref 0.00–0.07)
Basophils Absolute: 0 10*3/uL (ref 0.0–0.1)
Basophils Relative: 1 %
Eosinophils Absolute: 0 10*3/uL (ref 0.0–0.5)
Eosinophils Relative: 1 %
HCT: 24.2 % — ABNORMAL LOW (ref 36.0–46.0)
Hemoglobin: 7.8 g/dL — ABNORMAL LOW (ref 12.0–15.0)
Immature Granulocytes: 0 %
Lymphocytes Relative: 14 %
Lymphs Abs: 0.8 10*3/uL (ref 0.7–4.0)
MCH: 29.8 pg (ref 26.0–34.0)
MCHC: 32.2 g/dL (ref 30.0–36.0)
MCV: 92.4 fL (ref 80.0–100.0)
Monocytes Absolute: 0.4 10*3/uL (ref 0.1–1.0)
Monocytes Relative: 8 %
Neutro Abs: 4.3 10*3/uL (ref 1.7–7.7)
Neutrophils Relative %: 76 %
Platelets: 218 10*3/uL (ref 150–400)
RBC: 2.62 MIL/uL — ABNORMAL LOW (ref 3.87–5.11)
RDW: 18.4 % — ABNORMAL HIGH (ref 11.5–15.5)
WBC: 5.6 10*3/uL (ref 4.0–10.5)
nRBC: 0 % (ref 0.0–0.2)

## 2020-09-06 LAB — COMPREHENSIVE METABOLIC PANEL
ALT: 7 U/L (ref 0–44)
AST: 16 U/L (ref 15–41)
Albumin: 3.2 g/dL — ABNORMAL LOW (ref 3.5–5.0)
Alkaline Phosphatase: 81 U/L (ref 38–126)
Anion gap: 9 (ref 5–15)
BUN: 7 mg/dL — ABNORMAL LOW (ref 8–23)
CO2: 29 mmol/L (ref 22–32)
Calcium: 8.4 mg/dL — ABNORMAL LOW (ref 8.9–10.3)
Chloride: 100 mmol/L (ref 98–111)
Creatinine, Ser: 0.5 mg/dL (ref 0.44–1.00)
GFR, Estimated: >60 mL/min (ref >60)
Glucose, Bld: 98 mg/dL (ref 70–99)
Potassium: 3.8 mmol/L (ref 3.5–5.1)
Sodium: 138 mmol/L (ref 135–145)
Total Bilirubin: 0.2 mg/dL — ABNORMAL LOW (ref 0.3–1.2)
Total Protein: 6.2 g/dL — ABNORMAL LOW (ref 6.5–8.1)

## 2020-09-06 MED ORDER — PALONOSETRON HCL INJECTION 0.25 MG/5ML
0.2500 mg | Freq: Once | INTRAVENOUS | Status: AC
  Administered 2020-09-06: 0.25 mg via INTRAVENOUS
  Filled 2020-09-06: qty 5

## 2020-09-06 MED ORDER — SODIUM CHLORIDE 0.9 % IV SOLN
10.0000 mg | Freq: Once | INTRAVENOUS | Status: AC
  Administered 2020-09-06: 10 mg via INTRAVENOUS
  Filled 2020-09-06: qty 10

## 2020-09-06 MED ORDER — HEPARIN SOD (PORK) LOCK FLUSH 100 UNIT/ML IV SOLN
500.0000 [IU] | Freq: Once | INTRAVENOUS | Status: AC | PRN
  Administered 2020-09-06: 500 [IU]

## 2020-09-06 MED ORDER — FAMOTIDINE IN NACL 20-0.9 MG/50ML-% IV SOLN
20.0000 mg | Freq: Once | INTRAVENOUS | Status: AC
  Administered 2020-09-06: 20 mg via INTRAVENOUS
  Filled 2020-09-06: qty 50

## 2020-09-06 MED ORDER — SODIUM CHLORIDE 0.9 % IV SOLN
538.8000 mg | Freq: Once | INTRAVENOUS | Status: AC
  Administered 2020-09-06: 540 mg via INTRAVENOUS
  Filled 2020-09-06: qty 54

## 2020-09-06 MED ORDER — SODIUM CHLORIDE 0.9% FLUSH
10.0000 mL | INTRAVENOUS | Status: DC | PRN
  Administered 2020-09-06 (×2): 10 mL

## 2020-09-06 MED ORDER — SODIUM CHLORIDE 0.9 % IV SOLN: Freq: Once | INTRAVENOUS | Status: AC

## 2020-09-06 MED ORDER — DIPHENHYDRAMINE HCL 50 MG/ML IJ SOLN
50.0000 mg | Freq: Once | INTRAMUSCULAR | Status: AC
  Administered 2020-09-06: 50 mg via INTRAVENOUS
  Filled 2020-09-06: qty 1

## 2020-09-06 MED ORDER — SODIUM CHLORIDE 0.9 % IV SOLN
140.0000 mg/m2 | Freq: Once | INTRAVENOUS | Status: AC
  Administered 2020-09-06: 216 mg via INTRAVENOUS
  Filled 2020-09-06: qty 36

## 2020-09-06 MED ORDER — SODIUM CHLORIDE 0.9 % IV SOLN
150.0000 mg | Freq: Once | INTRAVENOUS | Status: AC
  Administered 2020-09-06: 150 mg via INTRAVENOUS
  Filled 2020-09-06: qty 150

## 2020-09-06 NOTE — Patient Instructions (Signed)
[facility] Discharge Instructions for Patients Receiving Chemotherapy  Today you received the following chemotherapy agents   To help prevent nausea and vomiting after your treatment, we encourage you to take your nausea medication   If you develop nausea and vomiting that is not controlled by your nausea medication, call the clinic.   BELOW ARE SYMPTOMS THAT SHOULD BE REPORTED IMMEDIATELY:  *FEVER GREATER THAN 100.5 F  *CHILLS WITH OR WITHOUT FEVER  NAUSEA AND VOMITING THAT IS NOT CONTROLLED WITH YOUR NAUSEA MEDICATION  *UNUSUAL SHORTNESS OF BREATH  *UNUSUAL BRUISING OR BLEEDING  TENDERNESS IN MOUTH AND THROAT WITH OR WITHOUT PRESENCE OF ULCERS  *URINARY PROBLEMS  *BOWEL PROBLEMS  UNUSUAL RASH Items with * indicate a potential emergency and should be followed up as soon as possible.  Feel free to call the clinic should you have any questions or concerns. The clinic phone number is [phone].  Please show the CHEMO ALERT CARD at check-in to the Emergency Department and triage nurse.   

## 2020-09-06 NOTE — Patient Instructions (Signed)
[facility] at [facility] Discharge Instructions  Labs drawn from portacath today   Thank you for choosing [facility] at [facility] to provide your oncology and hematology care.  To afford each patient quality time with our provider, please arrive at least 15 minutes before your scheduled appointment time.   If you have a lab appointment with the Cancer Center please come in thru the Main Entrance and check in at the main information desk.  You need to re-schedule your appointment should you arrive 10 or more minutes late.  We strive to give you quality time with our providers, and arriving late affects you and other patients whose appointments are after yours.  Also, if you no show three or more times for appointments you may be dismissed from the clinic at the providers discretion.     Again, thank you for choosing [facility].  Our hope is that these requests will decrease the amount of time that you wait before being seen by our physicians.       _____________________________________________________________  Should you have questions after your visit to [facility], please contact our office at [phone] and follow the prompts.  Our office hours are 8:00 a.m. and 4:30 p.m. Monday - Friday.  Please note that voicemails left after 4:00 p.m. may not be returned until the following business day.  We are closed weekends and major holidays.  You do have access to a nurse 24-7, just call the main number to the clinic [phone] and do not press any options, hold on the line and a nurse will answer the phone.    For prescription refill requests, have your pharmacy contact our office and allow 72 hours.    Due to Covid, you will need to wear a mask upon entering the hospital. If you do not have a mask, a mask will be given to you at the Main Entrance upon arrival. For doctor visits, patients may have 1 support person age 18  or older with them. For treatment visits, patients can not have anyone with them due to social distancing guidelines and our immunocompromised population.     

## 2020-09-06 NOTE — Progress Notes (Signed)
[place], [place] [address]   CLINIC:  Medical Oncology/Hematology  PCP:  [name], [address] / [place] [address] [phone]   REASON FOR VISIT:  Follow-up for high-grade serous ovarian carcinoma  PRIOR THERAPY: None  NGS Results: BRCA 1/2 negative  CURRENT THERAPY: Carboplatin and paclitaxel every 3 weeks  BRIEF ONCOLOGIC HISTORY:  Oncology History  Peritoneal carcinomatosis ([name])  06/06/2020 Initial Diagnosis   Peritoneal carcinomatosis ([name])   07/05/2020 -  Chemotherapy   The patient had palonosetron (ALOXI) injection 0.25 mg, 0.25 mg, Intravenous,  Once, 3 of 6 cycles Administration: 0.25 mg (07/05/2020), 0.25 mg (07/26/2020), 0.25 mg (08/16/2020) pegfilgrastim-cbqv (UDENYCA) injection 6 mg, 6 mg, Subcutaneous, Once, 3 of 6 cycles Administration: 6 mg (07/07/2020), 6 mg (07/28/2020) CARBOplatin (PARAPLATIN) 540 mg in sodium chloride 0.9 % 250 mL chemo infusion, 540 mg (100 % of original dose 543.6 mg), Intravenous,  Once, 3 of 6 cycles Dose modification:   (original dose 543.6 mg, Cycle 1), 540 mg (original dose 540 mg, Cycle 2),   (original dose 538.8 mg, Cycle 3) Administration: 540 mg (07/05/2020), 540 mg (07/26/2020), 540 mg (08/16/2020) fosaprepitant (EMEND) 150 mg in sodium chloride 0.9 % 145 mL IVPB, 150 mg, Intravenous,  Once, 3 of 6 cycles Administration: 150 mg (07/05/2020), 150 mg (07/26/2020), 150 mg (08/16/2020) PACLitaxel (TAXOL) 216 mg in sodium chloride 0.9 % 250 mL chemo infusion (> 46m/m2), 140 mg/m2 = 216 mg (80 % of original dose 175 mg/m2), Intravenous,  Once, 3 of 6 cycles Dose modification: 140 mg/m2 (80 % of original dose 175 mg/m2, Cycle 1, Reason: Other (see comments), Comment: neuropathy) Administration: 216 mg (07/05/2020), 216 mg (07/26/2020), 216 mg (08/16/2020)  for chemotherapy treatment.    07/22/2020 Genetic Testing   Negative genetic testing: no pathogenic variants detected in [facility] Expanded  CancerNext Panel + RNAInsight. The CancerNext-Expanded gene panel offered by [facility] includes sequencing and rearrangement analysis for the following 77 genes: AIP, ALK, APC*, ATM*, AXIN2, BAP1, BARD1, BLM, BMPR1A, BRCA1*, BRCA2*, BRIP1*, CDC73, CDH1*, CDK4, CDKN1B, CDKN2A, CHEK2*, CTNNA1, DICER1, FANCC, FH, FLCN, GALNT12, KIF1B, LZTR1, MAX, MEN1, MET, MLH1*, MSH2*, MSH3, MSH6*, MUTYH*, NBN, NF1*, NF2, NTHL1, PALB2*, PHOX2B, PMS2*, POT1, PRKAR1A, PTCH1, PTEN*, RAD51C*, RAD51D*, RB1, RECQL, RET, SDHA, SDHAF2, SDHB, SDHC, SDHD, SMAD4, SMARCA4, SMARCB1, SMARCE1, STK11, SUFU, TMEM127, TP53*, TSC1, TSC2, VHL and XRCC2 (sequencing and deletion/duplication); EGFR, EGLN1, HOXB13, KIT, MITF, PDGFRA, POLD1, and POLE (sequencing only); EPCAM and GREM1 (deletion/duplication only). DNA and RNA analyses performed for * genes. The report date is July 22, 2020.      CANCER STAGING: Cancer Staging No matching staging information was found for the patient.  INTERVAL HISTORY:  [name] a 64y.o. female, returns for routine follow-up and consideration for next cycle of chemotherapy. DKendallwas last seen on 08/16/2020.  Due for cycle #4 of carboplatin and paclitaxel today.   Today she is accompanied by her husband. Overall, she tells me she has been feeling pretty well. She tolerated the previous treatment well. She reports having several episodes of pain in her LLQ starting on 10/16, though not severe. She denies dysuria. She denies N/V/D/C and her bowels are moving regularly. She is using her nebulizer twice daily. Her numbness and tingling is stable.  Overall, she feels ready for next cycle of chemo today.    REVIEW OF SYSTEMS:  Review of Systems  Constitutional: Positive for appetite change (75%) and fatigue (50%).  Gastrointestinal: Positive for abdominal pain (6/10 LLQ pain). Negative for constipation, diarrhea, nausea and vomiting.  Genitourinary: Negative for dysuria.     Neurological: Positive for numbness (stable).  Psychiatric/Behavioral: Positive for sleep disturbance.  All other systems reviewed and are negative.   PAST MEDICAL/SURGICAL HISTORY:  Past Medical History:  Diagnosis Date  . Anemia   . Asthma   . [facility])    Gastric Cancer  . COPD (chronic obstructive pulmonary disease) ([place])   . High cholesterol   . Hypertension   . Neuropathy   . Pneumonia 2019  . Port-A-Cath in place 07/03/2020   Past Surgical History:  Procedure Laterality Date  . HALLUX VALGUS BASE WEDGE Right 06/09/2015   Procedure: Base wedge osteotomy with modified McBride right foot ;  Surgeon: Vicki Alamo, MD;  Location: [facility] ORS;  Service: Podiatry;  Laterality: Right;  . PORTACATH PLACEMENT Left 06/28/2020   Procedure: PORT-A-CATHETER PLACEMENT LEFT CHEST (attached catheter in left subclavian);  Surgeon: Vicki Cagey, MD;  Location: [facility];  Service: General;  Laterality: Left;    SOCIAL HISTORY:  Social History   Socioeconomic History  . Marital status: Divorced    Spouse name: Not on file  . Number of children: 3  . Years of education: Not on file  . Highest education level: Not on file  Occupational History  . Occupation: DISABLED  Tobacco Use  . Smoking status: Former Smoker    Packs/day: 2.00    Years: 30.00    Pack years: 60.00    Types: Cigarettes    Quit date: 11/04/2013    Years since quitting: 6.8  . Smokeless tobacco: Never Used  Vaping Use  . Vaping Use: Never used  Substance and Sexual Activity  . Alcohol use: No  . Drug use: No  . Sexual activity: Not Currently  Other Topics Concern  . Not on file  Social History Narrative  . Not on file   Social Determinants of Health   Financial Resource Strain: Medium Risk  . Difficulty of Paying Living Expenses: Somewhat hard  Food Insecurity: No Food Insecurity  . Worried About Charity fundraiser in the Last Year: Never true  . Ran Out of Food in the Last Year: Never true   Transportation Needs: No Transportation Needs  . Lack of Transportation (Medical): No  . Lack of Transportation (Non-Medical): No  Physical Activity: Inactive  . Days of Exercise per Week: 0 days  . Minutes of Exercise per Session: 0 min  Stress: Stress Concern Present  . Feeling of Stress : To some extent  Social Connections: Moderately Isolated  . Frequency of Communication with Friends and Family: More than three times a week  . Frequency of Social Gatherings with Friends and Family: Never  . Attends Religious Services: More than 4 times per year  . Active Member of Clubs or Organizations: No  . Attends Archivist Meetings: Never  . Marital Status: Divorced  Human resources officer Violence: Not At Risk  . Fear of Current or Ex-Partner: No  . Emotionally Abused: No  . Physically Abused: No  . Sexually Abused: No    FAMILY HISTORY:  Family History  Problem Relation Age of Onset  . Alzheimer's disease Mother   . COPD Father   . Emphysema Father   . Hypertension Father   . Healthy Sister   . Healthy Brother   . Alzheimer's disease Maternal Grandmother   . Healthy Sister   . Healthy Sister   .  Healthy Sister   . Prostate cancer Other        paternal grandmother's brother; dx in early 74s  . Breast cancer Neg Hx     CURRENT MEDICATIONS:  Current Outpatient Medications  Medication Sig Dispense Refill  . acetylcysteine (MUCOMYST) 20 % nebulizer solution Inhale into the lungs.     Marland Kitchen albuterol (PROVENTIL HFA;VENTOLIN HFA) 108 (90 BASE) MCG/ACT inhaler Inhale 4-6 puffs into the lungs every 6 (six) hours as needed for wheezing or shortness of breath.     Marland Kitchen amLODipine (NORVASC) 5 MG tablet Take 5 mg by mouth at bedtime.     Marland Kitchen aspirin EC 81 MG tablet Take 81 mg by mouth daily.    Marland Kitchen CARBOPLATIN IV Inject into the vein every 21 ( twenty-one) days.    Marland Kitchen docusate sodium (COLACE) 100 MG capsule Take 1 capsule (100 mg total) by mouth 2 (two) times daily as needed for mild  constipation. 60 capsule 0  . ferrous sulfate 325 (65 FE) MG tablet Take 325 mg by mouth 2 (two) times daily with a meal.     . Fluticasone-Umeclidin-Vilant (TRELEGY ELLIPTA) 100-62.5-25 MCG/INH AEPB Inhale into the lungs.    . gabapentin (NEURONTIN) 300 MG capsule Take 300 mg by mouth 3 (three) times daily.     Marland Kitchen ipratropium (ATROVENT HFA) 17 MCG/ACT inhaler Inhale 2 puffs into the lungs every 6 (six) hours as needed for wheezing.     Marland Kitchen ipratropium-albuterol (DUONEB) 0.5-2.5 (3) MG/3ML SOLN Take 0.5 mg by nebulization every 6 (six) hours as needed.     . levalbuterol (XOPENEX) 1.25 MG/3ML nebulizer solution Inhale into the lungs.    Marland Kitchen levalbuterol (XOPENEX) 1.25 MG/3ML nebulizer solution Inhale into the lungs.    . lidocaine-prilocaine (EMLA) cream Apply a small amount to port a cath site and cover with plastic wrap 1 hour prior to chemotherapy appointments 30 g 3  . nortriptyline (PAMELOR) 25 MG capsule Take 25 mg by mouth at bedtime.    . ondansetron (ZOFRAN ODT) 4 MG disintegrating tablet 23m ODT q4 hours prn nausea/vomit 12 tablet 0  . oxyCODONE-acetaminophen (PERCOCET/ROXICET) 5-325 MG tablet Take 1 tablet by mouth every 4 (four) hours as needed for severe pain. 30 tablet 0  . OXYGEN Inhale 3 L into the lungs continuous.     .Marland KitchenPACLITAXEL IV Inject into the vein every 21 ( twenty-one) days.    . pantoprazole (PROTONIX) 40 MG tablet Take 1 tablet (40 mg total) by mouth daily. 30 tablet 2  . predniSONE (DELTASONE) 5 MG tablet Take 5 mg by mouth daily with breakfast.    . prochlorperazine (COMPAZINE) 10 MG tablet Take 1 tablet (10 mg total) by mouth every 6 (six) hours as needed (Nausea or vomiting). 30 tablet 1  . simvastatin (ZOCOR) 40 MG tablet Take 40 mg by mouth at bedtime.     No current facility-administered medications for this visit.    ALLERGIES:  No Known Allergies  PHYSICAL EXAM:  Performance status (ECOG): 1 - Symptomatic but completely ambulatory  Vitals:   09/06/20 0755    BP: (!) 117/58  Pulse: 94  Resp: 20  Temp: (!) 97 F (36.1 C)  SpO2: 98%   Wt Readings from Last 3 Encounters:  09/06/20 116 lb 1.6 oz (52.7 kg)  08/16/20 117 lb 4.6 oz (53.2 kg)  07/26/20 119 lb 9.6 oz (54.3 kg)   Physical Exam Vitals reviewed.  Constitutional:      Appearance: Normal appearance.  Cardiovascular:  Rate and Rhythm: Normal rate and regular rhythm.     Pulses: Normal pulses.     Heart sounds: Normal heart sounds.  Pulmonary:     Effort: Pulmonary effort is normal.     Breath sounds: Normal breath sounds.  Chest:     Comments: Port-a-Cath in L chest Abdominal:     Palpations: Abdomen is soft. There is no mass.     Tenderness: There is abdominal tenderness in the left lower quadrant.  Neurological:     General: No focal deficit present.     Mental Status: She is alert and oriented to person, place, and time.  Psychiatric:        Mood and Affect: Mood normal.        Behavior: Behavior normal.     LABORATORY DATA:  I have reviewed the labs as listed.  CBC Latest Ref Rng & Units 09/06/2020 08/16/2020 07/26/2020  WBC 4.0 - 10.5 K/uL 5.6 4.4 4.5  Hemoglobin 12.0 - 15.0 g/dL 7.8(L) 8.4(L) 8.7(L)  Hematocrit 36 - 46 % 24.2(L) 26.6(L) 27.4(L)  Platelets 150 - 400 K/uL 218 472(H) 555(H)   CMP Latest Ref Rng & Units 09/06/2020 08/16/2020 07/26/2020  Glucose 70 - 99 mg/dL 98 104(H) 111(H)  BUN 8 - 23 mg/dL 7(L) 7(L) <5(L)  Creatinine 0.44 - 1.00 mg/dL 0.50 0.47 0.49  Sodium 135 - 145 mmol/L 138 137 140  Potassium 3.5 - 5.1 mmol/L 3.8 3.5 3.6  Chloride 98 - 111 mmol/L 100 99 100  CO2 22 - 32 mmol/L '29 30 29  ' Calcium 8.9 - 10.3 mg/dL 8.4(L) 8.5(L) 8.5(L)  Total Protein 6.5 - 8.1 g/dL 6.2(L) 6.3(L) 5.9(L)  Total Bilirubin 0.3 - 1.2 mg/dL 0.2(L) 0.5 0.4  Alkaline Phos 38 - 126 U/L 81 71 78  AST 15 - 41 U/L '16 17 17  ' ALT 0 - 44 U/L '7 8 9    ' DIAGNOSTIC IMAGING:  I have independently reviewed the scans and discussed with the patient. CT CHEST ABDOMEN PELVIS W  CONTRAST  Result Date: 09/01/2020 CLINICAL DATA:  Ovarian cancer restaging. Peritoneal carcinomatosis. EXAM: CT CHEST, ABDOMEN, AND PELVIS WITH CONTRAST TECHNIQUE: Multidetector CT imaging of the chest, abdomen and pelvis was performed following the standard protocol during bolus administration of intravenous contrast. CONTRAST:  34m OMNIPAQUE IOHEXOL 300 MG/ML  SOLN COMPARISON:  Multiple exams, including 07/08/2020 and 06/27/2020 FINDINGS: CT CHEST FINDINGS Cardiovascular: Coronary, aortic arch, and branch vessel atherosclerotic vascular disease. Left Port-A-Cath tip: SVC. Mediastinum/Nodes: Left supraclavicular node 0.6 cm in short axis on image 10 of series 2, stable. Right lower paratracheal node 1.2 cm in short axis on image 24 series 2, stable. Right hilar lymph node 1.0 cm in short axis on image 28 of series 2, stable. Right eccentric subcarinal lymph node is improved, currently 1.0 cm in diameter and previously 1.3 cm. Small type 1 hiatal hernia. Lungs/Pleura: Centrilobular emphysema. New 4 mm right upper lobe lung nodule on image 55 of series 3. Airway thickening is present, suggesting bronchitis or reactive airways disease. Mild scarring in both lung apices. Lingular scarring. Scarring or atelectasis laterally in the left lower lobe and along the left hemidiaphragm, with airway plugging in the left lower lobe. Musculoskeletal: Old left rib deformities from healed fractures. CT ABDOMEN PELVIS FINDINGS Hepatobiliary: Complex appearance of the gallbladder likely from tumor deposition along the gallbladder wall, similar to prior. Fluid density in thickening along the posterior capsular margin of the right hepatic lobe measuring up to 0.7 cm in thickness  on image 57 of series 2, previously up to 1.2 cm in thickness. No biliary dilatation. 0.4 cm hypodense lesion in the periphery of the right hepatic lobe on image 57 of series 2 is stable and potentially a cyst. Pancreas: Unremarkable Spleen: Unremarkable  Adrenals/Urinary Tract: Unremarkable Stomach/Bowel: Sigmoid colon diverticulosis. Vascular/Lymphatic: Aortoiliac atherosclerotic vascular disease. Peripancreatic node 1.0 cm in short axis on image 61 of series 2, formerly 1.2 cm. Right gastric node 0.8 cm in short axis on image 52 of series 2, formerly the same. Peripancreatic node 1.1 cm in short axis on image 58 of series 2, previously 1.0 cm. Portacaval node 1.2 cm in short axis on image 60 of series 2, previously 1.3 cm. Scattered enlarged mesenteric lymph nodes are present including a 1.0 cm mesenteric node on image 82 of series 2, formerly 0.9 cm. Reproductive: Uterine fibroids. Indistinctness of tissue planes along the ovarian margins of the uterus, somewhat obscuring the ovaries, although with subjectively reduced prominence of the adnexa compared to the prior exam. Other: Omental caking of tumor. This measures up to about 3.1 cm in thickness on image 76 of series 2, previously 3.7 cm in thickness. Subjectively the omental caking of tumor is mildly reduced. Musculoskeletal: Unremarkable IMPRESSION: 1. Overall mild improvement, with mild reduction in omental caking of tumor. Stable to minimally reduced mild adenopathy in the chest and abdomen. 2. New 4 mm right upper lobe lung nodule on image 55 of series 3. This could be inflammatory or neoplastic, surveillance suggested. 3. Other imaging findings of potential clinical significance: Coronary atherosclerosis. Small type 1 hiatal hernia. Airway thickening is present, suggesting bronchitis or reactive airways disease. Sigmoid colon diverticulosis. Uterine fibroids. Suspected tumor deposition along the gallbladder margin, similar to prior. 4. Emphysema and aortic atherosclerosis. Aortic Atherosclerosis (ICD10-I70.0) and Emphysema (ICD10-J43.9). Electronically Signed   By: Van Clines M.D.   On: 09/01/2020 13:38     ASSESSMENT:  1. Peritoneal carcinomatosis: -Presentation to the ER with right lower  quadrant abdominal pain on and off for 1 month. -CTAP on 05/29/2020 showed extensive nodularity throughout the omentum and upper peritoneal cavity. Both ovaries are prominent although no well-defined mass is noted.Small volume ascites. -CA-125 is elevated at 2407. CEA was 2.4. -CT of the chest shows 11 mm right paratracheal lymph node and 11 mm short axis precarinal lymph node. Subcarinal lymphadenopathy measuring 14 mm short axis. This is suspicious for metastatic disease. 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 -Cycle 1 of carboplatin and paclitaxel on 07/05/2020. -Evaluated by Dr. Denman George on 07/07/2020. Reevaluation after 3 cycles. -Possible surgical resection after 6 cycles. -CT CAP from 09/01/2020 showed mild improvement with reduction in the omental caking.  Stable to minimally reduced adenopathy in the chest and abdomen.  New 4 mm right upper lobe lung nodule could be inflammatory.  2. COPD: -Quit smoking 6 years ago. Smoked 2 packs/day for 25-30 years. -Has been on3 L/min oxygen via nasal cannulafor the last 5 years.  3. Family history: -Paternal grandmother with colon cancer.   PLAN:  1.High-grade serous ovarian carcinoma: -She has tolerated cycle 3 very well. -We reviewed CT results which showed mild improvement. -We will check CA-125 level.  LFTs show decreased albumin 3.2.  Hemoglobin is 7.8. -We will proceed with cycle 4.  Plan to repeat scans after cycle 6. -RTC 3 weeks.  2. Family history: -BRCA1/2 negative.  3. Peripheral neuropathy: -Continue gabapentin 300  mg 3 times a day. -No worsening of neuropathy since paclitaxel started.  4. Hypokalemia: -Continue potassium 40 mEq daily.  5.  Severe COPD: -Continue prednisone 5 mg daily.  Continue Trelegy and Xopenex.   Orders placed this  encounter:  No orders of the defined types were placed in this encounter.    Derek Jack, MD [place] [phone]   I, Milinda Antis, am acting as a scribe for Dr. Sanda Linger.  I, Derek Jack MD, have reviewed the above documentation for accuracy and completeness, and I agree with the above.

## 2020-09-06 NOTE — Patient Instructions (Signed)
[facility] Discharge Instructions  You were seen today by [name]. He went over your recent results. You received your treatment today. Eat protein-dense meals or drink high-protein Boost/Ensure to increase your blood albumin level. [name] will see you back in 3 weeks for labs and follow up.   Thank you for choosing [facility] to provide your oncology and hematology care.  To afford each patient quality time with our provider, please arrive at least 15 minutes before your scheduled appointment time.   If you have a lab appointment with the [facility] please come in thru the Main Entrance and check in at the main information desk  You need to re-schedule your appointment should you arrive 10 or more minutes late.  We strive to give you quality time with our providers, and arriving late affects you and other patients whose appointments are after yours.  Also, if you no show three or more times for appointments you may be dismissed from the clinic at the providers discretion.     Again, thank you for choosing [facility].  Our hope is that these requests will decrease the amount of time that you wait before being seen by our physicians.       _____________________________________________________________  Should you have questions after your visit to [facility], please contact our office at [phone] between the hours of 8:00 a.m. and 4:30 p.m.  Voicemails left after 4:00 p.m. will not be returned until the following business day.  For prescription refill requests, have your pharmacy contact our office and allow 72 hours.    Cancer Center Support Programs:   > Cancer Support Group  2nd Tuesday of the month 1pm-2pm, Journey Room

## 2020-09-06 NOTE — Progress Notes (Signed)
Pt here for D1C4 carbo and taxol.  Pts hemoglobin 7.8.  Labs reviewed with Dr Raliegh Ip, okay for treatment today.  Tolerated treatment well today without incidence.  Discharged in stable condition via wheelchair.  Vital signs stable prior to discharge.

## 2020-09-07 LAB — CA 125: Cancer Antigen (CA) 125: 140 U/mL — ABNORMAL HIGH (ref 0.0–38.1)

## 2020-09-08 ENCOUNTER — Other Ambulatory Visit: Payer: Self-pay

## 2020-09-08 ENCOUNTER — Inpatient Hospital Stay (HOSPITAL_COMMUNITY): Payer: Medicare HMO

## 2020-09-08 VITALS — BP 118/64 | HR 92 | Temp 98.0°F | Resp 18

## 2020-09-08 DIAGNOSIS — C786 Secondary malignant neoplasm of retroperitoneum and peritoneum: Secondary | ICD-10-CM

## 2020-09-08 DIAGNOSIS — Z5111 Encounter for antineoplastic chemotherapy: Secondary | ICD-10-CM | POA: Diagnosis not present

## 2020-09-08 DIAGNOSIS — Z95828 Presence of other vascular implants and grafts: Secondary | ICD-10-CM

## 2020-09-08 MED ORDER — PEGFILGRASTIM-CBQV 6 MG/0.6ML ~~LOC~~ SOSY
6.0000 mg | PREFILLED_SYRINGE | Freq: Once | SUBCUTANEOUS | Status: AC
  Administered 2020-09-08: 6 mg via SUBCUTANEOUS
  Filled 2020-09-08: qty 0.6

## 2020-09-08 NOTE — Progress Notes (Signed)
[name] presents today for injection per the provider's orders.  Udenyca injection administration without incident; injection site WNL; see MAR for injection details.  Patient tolerated procedure well and without incident.  No questions or complaints noted at this time.

## 2020-09-08 NOTE — Progress Notes (Signed)
Nutrition Follow-up:  Patient with ovarian carcinoma.  Patient receiving neoadjuvant chemotherapy.    Spoke with patient and boyfriend in clinic this am prior to injection.  Patient reports that her appetite is up and down.  Does not want to loose any more weight.  Reports that she gets up late and eats frosted flakes for breakfast most mornings and sometimes eggs and bacon (2 times per week).  Usually skips lunch and snacks.  Boyfriend prepares supper meal, usually meat and 1-2 sides.  Patient ate hamburger with fries last night for dinner. Patient not drinking carnation instant breakfast shake daily (mixes with ice cream/yogurt). Reports bowels are moving daily.      Medications: reviewed  Labs: reviewed  Anthropometrics:   Weight 116 lb on 10/20 decreased from 119 lb.    125 lb on 7/21  NUTRITION DIAGNOSIS: Unintentional weight loss continues   INTERVENTION:  Patient wants to drink carnation breakfast shake daily for added calories. Reviewed ways to add calories and protein to diet.     MONITORING, EVALUATION, GOAL: weight trends, intake   NEXT VISIT: November 12 in clinic  Aneri Slagel B. Zenia Resides, [place], [place] Registered Dietitian [phone] (mobile)

## 2020-09-27 ENCOUNTER — Encounter (HOSPITAL_COMMUNITY): Payer: Self-pay | Admitting: Hematology

## 2020-09-27 ENCOUNTER — Inpatient Hospital Stay (HOSPITAL_COMMUNITY): Payer: Medicare HMO

## 2020-09-27 ENCOUNTER — Inpatient Hospital Stay (HOSPITAL_COMMUNITY): Payer: Medicare HMO | Attending: Hematology

## 2020-09-27 ENCOUNTER — Inpatient Hospital Stay (HOSPITAL_BASED_OUTPATIENT_CLINIC_OR_DEPARTMENT_OTHER): Payer: Medicare HMO | Admitting: Hematology

## 2020-09-27 ENCOUNTER — Other Ambulatory Visit: Payer: Self-pay

## 2020-09-27 VITALS — BP 145/77 | HR 99 | Temp 97.2°F | Resp 18 | Wt 117.4 lb

## 2020-09-27 VITALS — BP 130/80 | HR 94 | Temp 97.4°F | Resp 17

## 2020-09-27 DIAGNOSIS — Z7982 Long term (current) use of aspirin: Secondary | ICD-10-CM | POA: Insufficient documentation

## 2020-09-27 DIAGNOSIS — Z8042 Family history of malignant neoplasm of prostate: Secondary | ICD-10-CM | POA: Insufficient documentation

## 2020-09-27 DIAGNOSIS — D649 Anemia, unspecified: Secondary | ICD-10-CM | POA: Diagnosis not present

## 2020-09-27 DIAGNOSIS — Z87891 Personal history of nicotine dependence: Secondary | ICD-10-CM | POA: Insufficient documentation

## 2020-09-27 DIAGNOSIS — Z85028 Personal history of other malignant neoplasm of stomach: Secondary | ICD-10-CM | POA: Diagnosis not present

## 2020-09-27 DIAGNOSIS — J449 Chronic obstructive pulmonary disease, unspecified: Secondary | ICD-10-CM | POA: Insufficient documentation

## 2020-09-27 DIAGNOSIS — C569 Malignant neoplasm of unspecified ovary: Secondary | ICD-10-CM | POA: Insufficient documentation

## 2020-09-27 DIAGNOSIS — Z5111 Encounter for antineoplastic chemotherapy: Secondary | ICD-10-CM | POA: Diagnosis not present

## 2020-09-27 DIAGNOSIS — Z8249 Family history of ischemic heart disease and other diseases of the circulatory system: Secondary | ICD-10-CM | POA: Insufficient documentation

## 2020-09-27 DIAGNOSIS — C786 Secondary malignant neoplasm of retroperitoneum and peritoneum: Secondary | ICD-10-CM

## 2020-09-27 DIAGNOSIS — G620 Drug-induced polyneuropathy: Secondary | ICD-10-CM | POA: Diagnosis not present

## 2020-09-27 DIAGNOSIS — Z79899 Other long term (current) drug therapy: Secondary | ICD-10-CM | POA: Diagnosis not present

## 2020-09-27 DIAGNOSIS — I10 Essential (primary) hypertension: Secondary | ICD-10-CM | POA: Insufficient documentation

## 2020-09-27 DIAGNOSIS — Z5189 Encounter for other specified aftercare: Secondary | ICD-10-CM | POA: Diagnosis not present

## 2020-09-27 DIAGNOSIS — Z800 Family history of malignant neoplasm of digestive organs: Secondary | ICD-10-CM | POA: Insufficient documentation

## 2020-09-27 DIAGNOSIS — E876 Hypokalemia: Secondary | ICD-10-CM | POA: Insufficient documentation

## 2020-09-27 DIAGNOSIS — Z95828 Presence of other vascular implants and grafts: Secondary | ICD-10-CM

## 2020-09-27 LAB — CBC WITH DIFFERENTIAL/PLATELET
Abs Immature Granulocytes: 0.01 10*3/uL (ref 0.00–0.07)
Basophils Absolute: 0 10*3/uL (ref 0.0–0.1)
Basophils Relative: 1 %
Eosinophils Absolute: 0.1 10*3/uL (ref 0.0–0.5)
Eosinophils Relative: 2 %
HCT: 23.7 % — ABNORMAL LOW (ref 36.0–46.0)
Hemoglobin: 7.5 g/dL — ABNORMAL LOW (ref 12.0–15.0)
Immature Granulocytes: 0 %
Lymphocytes Relative: 28 %
Lymphs Abs: 0.7 10*3/uL (ref 0.7–4.0)
MCH: 30.9 pg (ref 26.0–34.0)
MCHC: 31.6 g/dL (ref 30.0–36.0)
MCV: 97.5 fL (ref 80.0–100.0)
Monocytes Absolute: 0.3 10*3/uL (ref 0.1–1.0)
Monocytes Relative: 12 %
Neutro Abs: 1.4 10*3/uL — ABNORMAL LOW (ref 1.7–7.7)
Neutrophils Relative %: 57 %
Platelets: 162 10*3/uL (ref 150–400)
RBC: 2.43 MIL/uL — ABNORMAL LOW (ref 3.87–5.11)
RDW: 21.2 % — ABNORMAL HIGH (ref 11.5–15.5)
WBC: 2.4 10*3/uL — ABNORMAL LOW (ref 4.0–10.5)
nRBC: 0 % (ref 0.0–0.2)

## 2020-09-27 LAB — COMPREHENSIVE METABOLIC PANEL
ALT: 11 U/L (ref 0–44)
AST: 20 U/L (ref 15–41)
Albumin: 3.6 g/dL (ref 3.5–5.0)
Alkaline Phosphatase: 86 U/L (ref 38–126)
Anion gap: 8 (ref 5–15)
BUN: 7 mg/dL — ABNORMAL LOW (ref 8–23)
CO2: 28 mmol/L (ref 22–32)
Calcium: 8.6 mg/dL — ABNORMAL LOW (ref 8.9–10.3)
Chloride: 100 mmol/L (ref 98–111)
Creatinine, Ser: 0.54 mg/dL (ref 0.44–1.00)
GFR, Estimated: >60 mL/min (ref >60)
Glucose, Bld: 102 mg/dL — ABNORMAL HIGH (ref 70–99)
Potassium: 3.9 mmol/L (ref 3.5–5.1)
Sodium: 136 mmol/L (ref 135–145)
Total Bilirubin: 0.4 mg/dL (ref 0.3–1.2)
Total Protein: 6.3 g/dL — ABNORMAL LOW (ref 6.5–8.1)

## 2020-09-27 MED ORDER — DIPHENHYDRAMINE HCL 50 MG/ML IJ SOLN
50.0000 mg | Freq: Once | INTRAMUSCULAR | Status: AC
  Administered 2020-09-27: 50 mg via INTRAVENOUS
  Filled 2020-09-27: qty 1

## 2020-09-27 MED ORDER — SODIUM CHLORIDE 0.9% FLUSH
10.0000 mL | INTRAVENOUS | Status: DC | PRN
  Administered 2020-09-27: 10 mL

## 2020-09-27 MED ORDER — FAMOTIDINE IN NACL 20-0.9 MG/50ML-% IV SOLN
20.0000 mg | Freq: Once | INTRAVENOUS | Status: AC
  Administered 2020-09-27: 20 mg via INTRAVENOUS
  Filled 2020-09-27: qty 50

## 2020-09-27 MED ORDER — SODIUM CHLORIDE 0.9 % IV SOLN
538.8000 mg | Freq: Once | INTRAVENOUS | Status: AC
  Administered 2020-09-27: 540 mg via INTRAVENOUS
  Filled 2020-09-27: qty 54

## 2020-09-27 MED ORDER — SODIUM CHLORIDE 0.9 % IV SOLN
150.0000 mg | Freq: Once | INTRAVENOUS | Status: AC
  Administered 2020-09-27: 150 mg via INTRAVENOUS
  Filled 2020-09-27: qty 150

## 2020-09-27 MED ORDER — HEPARIN SOD (PORK) LOCK FLUSH 100 UNIT/ML IV SOLN
500.0000 [IU] | Freq: Once | INTRAVENOUS | Status: AC | PRN
  Administered 2020-09-27: 500 [IU]

## 2020-09-27 MED ORDER — SODIUM CHLORIDE 0.9 % IV SOLN
140.0000 mg/m2 | Freq: Once | INTRAVENOUS | Status: AC
  Administered 2020-09-27: 216 mg via INTRAVENOUS
  Filled 2020-09-27: qty 36

## 2020-09-27 MED ORDER — SODIUM CHLORIDE 0.9 % IV SOLN: Freq: Once | INTRAVENOUS | Status: AC

## 2020-09-27 MED ORDER — SODIUM CHLORIDE 0.9 % IV SOLN
10.0000 mg | Freq: Once | INTRAVENOUS | Status: AC
  Administered 2020-09-27: 10 mg via INTRAVENOUS
  Filled 2020-09-27: qty 10

## 2020-09-27 MED ORDER — PALONOSETRON HCL INJECTION 0.25 MG/5ML
0.2500 mg | Freq: Once | INTRAVENOUS | Status: AC
  Administered 2020-09-27: 0.25 mg via INTRAVENOUS
  Filled 2020-09-27: qty 5

## 2020-09-27 NOTE — Progress Notes (Signed)
[name], [place] [address]   CLINIC:  Medical Oncology/Hematology  PCP:  Ranae Plumber, [place] / [place] [address] [phone]   REASON FOR VISIT:  Follow-up for high-grade serous ovarian carcinoma  PRIOR THERAPY: None  NGS Results: BRCA 1/2 negative  CURRENT THERAPY: Carboplatin & paclitaxel every 3 weeks  BRIEF ONCOLOGIC HISTORY:  Oncology History  Peritoneal carcinomatosis ([place])  06/06/2020 Initial Diagnosis   Peritoneal carcinomatosis ([place])   07/05/2020 -  Chemotherapy   The patient had palonosetron (ALOXI) injection 0.25 mg, 0.25 mg, Intravenous,  Once, 4 of 6 cycles Administration: 0.25 mg (07/05/2020), 0.25 mg (07/26/2020), 0.25 mg (08/16/2020), 0.25 mg (09/06/2020) pegfilgrastim-cbqv (UDENYCA) injection 6 mg, 6 mg, Subcutaneous, Once, 4 of 6 cycles Administration: 6 mg (07/07/2020), 6 mg (07/28/2020), 6 mg (08/18/2020), 6 mg (09/08/2020) CARBOplatin (PARAPLATIN) 540 mg in sodium chloride 0.9 % 250 mL chemo infusion, 540 mg (100 % of original dose 543.6 mg), Intravenous,  Once, 4 of 6 cycles Dose modification:   (original dose 543.6 mg, Cycle 1), 540 mg (original dose 540 mg, Cycle 2),   (original dose 538.8 mg, Cycle 3),   (original dose 538.8 mg, Cycle 4),   (original dose 538.8 mg, Cycle 5) Administration: 540 mg (07/05/2020), 540 mg (07/26/2020), 540 mg (08/16/2020), 540 mg (09/06/2020) fosaprepitant (EMEND) 150 mg in sodium chloride 0.9 % 145 mL IVPB, 150 mg, Intravenous,  Once, 4 of 6 cycles Administration: 150 mg (07/05/2020), 150 mg (07/26/2020), 150 mg (08/16/2020), 150 mg (09/06/2020) PACLitaxel (TAXOL) 216 mg in sodium chloride 0.9 % 250 mL chemo infusion (> 56m/m2), 140 mg/m2 = 216 mg (80 % of original dose 175 mg/m2), Intravenous,  Once, 4 of 6 cycles Dose modification: 140 mg/m2 (80 % of original dose 175 mg/m2, Cycle 1, Reason: Other (see comments), Comment: neuropathy) Administration: 216 mg (07/05/2020), 216 mg  (07/26/2020), 216 mg (08/16/2020), 216 mg (09/06/2020)  for chemotherapy treatment.    07/22/2020 Genetic Testing   Negative genetic testing: no pathogenic variants detected in [facility] Expanded CancerNext Panel + RNAInsight. The CancerNext-Expanded gene panel [facility] by [facility] includes sequencing and rearrangement analysis for the following 77 genes: AIP, ALK, APC*, ATM*, AXIN2, BAP1, BARD1, BLM, BMPR1A, BRCA1*, BRCA2*, BRIP1*, CDC73, CDH1*, CDK4, CDKN1B, CDKN2A, CHEK2*, CTNNA1, DICER1, FANCC, FH, FLCN, GALNT12, KIF1B, LZTR1, MAX, MEN1, MET, MLH1*, MSH2*, MSH3, MSH6*, MUTYH*, NBN, NF1*, NF2, NTHL1, PALB2*, PHOX2B, PMS2*, POT1, PRKAR1A, PTCH1, PTEN*, RAD51C*, RAD51D*, RB1, RECQL, RET, SDHA, SDHAF2, SDHB, SDHC, SDHD, SMAD4, SMARCA4, SMARCB1, SMARCE1, STK11, SUFU, TMEM127, TP53*, TSC1, TSC2, VHL and XRCC2 (sequencing and deletion/duplication); EGFR, EGLN1, HOXB13, KIT, MITF, PDGFRA, POLD1, and POLE (sequencing only); EPCAM and GREM1 (deletion/duplication only). DNA and RNA analyses performed for * genes. The report date is July 22, 2020.      CANCER STAGING: Cancer Staging No matching staging information was found for the patient.  INTERVAL HISTORY:  [name] a 64y.o. female, returns for routine follow-up and consideration for next cycle of chemotherapy. DMoniguewas last seen on 09/06/2020.  Due for cycle #5 of carboplatin and paclitaxel today.   Today she is accompanied by her sister, BHassan Rowan Overall, she tells me she has been feeling pretty well. She tolerated the previous treatment well. She continues having mild pain in her left flank. She continues having SOB which worsened since the weather got cooler. The numbness and tingling in her feet is stable and she is taking gabapentin TID. She is  not taking potassium.  Overall, she feels ready for next cycle of chemo today.    REVIEW OF SYSTEMS:  Review of Systems  Constitutional: Positive for appetite change (75%) and fatigue  (50%).  Respiratory: Positive for shortness of breath (d/t COPD).   Gastrointestinal: Positive for abdominal pain (4/10 L flank pain).  Neurological: Positive for numbness (feet).  All other systems reviewed and are negative.   PAST MEDICAL/SURGICAL HISTORY:  Past Medical History:  Diagnosis Date  . Anemia   . Asthma   . Cancer [facility])    Gastric Cancer  . COPD (chronic obstructive pulmonary disease) ([place])   . High cholesterol   . Hypertension   . Neuropathy   . Pneumonia 2019  . Port-A-Cath in place 07/03/2020   Past Surgical History:  Procedure Laterality Date  . HALLUX VALGUS BASE WEDGE Right 06/09/2015   Procedure: Base wedge osteotomy with modified McBride right foot ;  Surgeon: Sharlotte Alamo, MD;  Location: [facility] ORS;  Service: Podiatry;  Laterality: Right;  . PORTACATH PLACEMENT Left 06/28/2020   Procedure: PORT-A-CATHETER PLACEMENT LEFT CHEST (attached catheter in left subclavian);  Surgeon: Virl Cagey, MD;  Location: [facility]S;  Service: General;  Laterality: Left;    SOCIAL HISTORY:  Social History   Socioeconomic History  . Marital status: Divorced    Spouse name: Not on file  . Number of children: 3  . Years of education: Not on file  . Highest education level: Not on file  Occupational History  . Occupation: DISABLED  Tobacco Use  . Smoking status: Former Smoker    Packs/day: 2.00    Years: 30.00    Pack years: 60.00    Types: Cigarettes    Quit date: 11/04/2013    Years since quitting: 6.9  . Smokeless tobacco: Never Used  Vaping Use  . Vaping Use: Never used  Substance and Sexual Activity  . Alcohol use: No  . Drug use: No  . Sexual activity: Not Currently  Other Topics Concern  . Not on file  Social History Narrative  . Not on file   Social Determinants of Health   Financial Resource Strain: Low Risk   . Difficulty of Paying Living Expenses: Not hard at all  Food Insecurity: No Food Insecurity  . Worried About Charity fundraiser in the  Last Year: Never true  . Ran Out of Food in the Last Year: Never true  Transportation Needs: No Transportation Needs  . Lack of Transportation (Medical): No  . Lack of Transportation (Non-Medical): No  Physical Activity: Inactive  . Days of Exercise per Week: 0 days  . Minutes of Exercise per Session: 0 min  Stress: No Stress Concern Present  . Feeling of Stress : Not at all  Social Connections: Socially Isolated  . Frequency of Communication with Friends and Family: More than three times a week  . Frequency of Social Gatherings with Friends and Family: Never  . Attends Religious Services: Never  . Active Member of Clubs or Organizations: No  . Attends Archivist Meetings: Never  . Marital Status: Divorced  Human resources officer Violence: Not At Risk  . Fear of Current or Ex-Partner: No  . Emotionally Abused: No  . Physically Abused: No  . Sexually Abused: No    FAMILY HISTORY:  Family History  Problem Relation Age of Onset  . Alzheimer's disease Mother   . COPD Father   . Emphysema Father   . Hypertension Father   .  Healthy Sister   . Healthy Brother   . Alzheimer's disease Maternal Grandmother   . Healthy Sister   . Healthy Sister   . Healthy Sister   . Prostate cancer Other        paternal grandmother's brother; dx in early 60s  . Breast cancer Neg Hx     CURRENT MEDICATIONS:  Current Outpatient Medications  Medication Sig Dispense Refill  . acetylcysteine (MUCOMYST) 20 % nebulizer solution Inhale into the lungs.     Marland Kitchen albuterol (PROVENTIL HFA;VENTOLIN HFA) 108 (90 BASE) MCG/ACT inhaler Inhale 4-6 puffs into the lungs every 6 (six) hours as needed for wheezing or shortness of breath.     Marland Kitchen amLODipine (NORVASC) 5 MG tablet Take 5 mg by mouth at bedtime.     Marland Kitchen aspirin EC 81 MG tablet Take 81 mg by mouth daily.    Marland Kitchen CARBOPLATIN IV Inject into the vein every 21 ( twenty-one) days.    Marland Kitchen docusate sodium (COLACE) 100 MG capsule Take 1 capsule (100 mg total) by mouth  2 (two) times daily as needed for mild constipation. 60 capsule 0  . ferrous sulfate 325 (65 FE) MG tablet Take 325 mg by mouth 2 (two) times daily with a meal.     . Fluticasone-Umeclidin-Vilant (TRELEGY ELLIPTA) 100-62.5-25 MCG/INH AEPB Inhale into the lungs.    . gabapentin (NEURONTIN) 300 MG capsule Take 300 mg by mouth 3 (three) times daily.     Marland Kitchen ipratropium (ATROVENT HFA) 17 MCG/ACT inhaler Inhale 2 puffs into the lungs every 6 (six) hours as needed for wheezing.     Marland Kitchen ipratropium-albuterol (DUONEB) 0.5-2.5 (3) MG/3ML SOLN Take 0.5 mg by nebulization every 6 (six) hours as needed.     . levalbuterol (XOPENEX) 1.25 MG/3ML nebulizer solution Inhale into the lungs.    Marland Kitchen levalbuterol (XOPENEX) 1.25 MG/3ML nebulizer solution Inhale into the lungs.    . lidocaine-prilocaine (EMLA) cream Apply a small amount to port a cath site and cover with plastic wrap 1 hour prior to chemotherapy appointments (Patient not taking: Reported on 09/08/2020) 30 g 3  . nortriptyline (PAMELOR) 25 MG capsule Take 25 mg by mouth at bedtime.    . ondansetron (ZOFRAN ODT) 4 MG disintegrating tablet 62m ODT q4 hours prn nausea/vomit (Patient not taking: Reported on 09/08/2020) 12 tablet 0  . oxyCODONE-acetaminophen (PERCOCET/ROXICET) 5-325 MG tablet Take 1 tablet by mouth every 4 (four) hours as needed for severe pain. 30 tablet 0  . OXYGEN Inhale 3 L into the lungs continuous.     .Marland KitchenPACLITAXEL IV Inject into the vein every 21 ( twenty-one) days.    . pantoprazole (PROTONIX) 40 MG tablet Take 1 tablet (40 mg total) by mouth daily. 30 tablet 2  . predniSONE (DELTASONE) 5 MG tablet Take 5 mg by mouth daily with breakfast.    . prochlorperazine (COMPAZINE) 10 MG tablet Take 1 tablet (10 mg total) by mouth every 6 (six) hours as needed (Nausea or vomiting). (Patient not taking: Reported on 09/08/2020) 30 tablet 1  . simvastatin (ZOCOR) 40 MG tablet Take 40 mg by mouth at bedtime.     No current facility-administered  medications for this visit.    ALLERGIES:  No Known Allergies  PHYSICAL EXAM:  Performance status (ECOG): 1 - Symptomatic but completely ambulatory  Vitals:   09/27/20 0825  BP: (!) 145/77  Pulse: 99  Resp: 18  Temp: (!) 97.2 F (36.2 C)  SpO2: 98%   Wt Readings from Last 3  Encounters:  09/27/20 117 lb 6.4 oz (53.3 kg)  09/06/20 116 lb 1.6 oz (52.7 kg)  08/16/20 117 lb 4.6 oz (53.2 kg)   Physical Exam Vitals reviewed.  Constitutional:      Appearance: Normal appearance.     Interventions: Nasal cannula in place.  Cardiovascular:     Rate and Rhythm: Normal rate and regular rhythm.     Pulses: Normal pulses.     Heart sounds: Normal heart sounds.  Pulmonary:     Effort: Pulmonary effort is normal.     Breath sounds: Examination of the right-lower field reveals rhonchi. Examination of the left-lower field reveals rhonchi. Rhonchi present.  Chest:     Comments: Port-a-Cath in L chest Musculoskeletal:     Right lower leg: No edema.     Left lower leg: No edema.  Neurological:     General: No focal deficit present.     Mental Status: She is alert and oriented to person, place, and time.  Psychiatric:        Mood and Affect: Mood normal.        Behavior: Behavior normal.     LABORATORY DATA:  I have reviewed the labs as listed.  CBC Latest Ref Rng & Units 09/27/2020 09/06/2020 08/16/2020  WBC 4.0 - 10.5 K/uL 2.4(L) 5.6 4.4  Hemoglobin 12.0 - 15.0 g/dL 7.5(L) 7.8(L) 8.4(L)  Hematocrit 36 - 46 % 23.7(L) 24.2(L) 26.6(L)  Platelets 150 - 400 K/uL 162 218 472(H)   CMP Latest Ref Rng & Units 09/27/2020 09/06/2020 08/16/2020  Glucose 70 - 99 mg/dL 102(H) 98 104(H)  BUN 8 - 23 mg/dL 7(L) 7(L) 7(L)  Creatinine 0.44 - 1.00 mg/dL 0.54 0.50 0.47  Sodium 135 - 145 mmol/L 136 138 137  Potassium 3.5 - 5.1 mmol/L 3.9 3.8 3.5  Chloride 98 - 111 mmol/L 100 100 99  CO2 22 - 32 mmol/L '28 29 30  ' Calcium 8.9 - 10.3 mg/dL 8.6(L) 8.4(L) 8.5(L)  Total Protein 6.5 - 8.1 g/dL 6.3(L)  6.2(L) 6.3(L)  Total Bilirubin 0.3 - 1.2 mg/dL 0.4 0.2(L) 0.5  Alkaline Phos 38 - 126 U/L 86 81 71  AST 15 - 41 U/L '20 16 17  ' ALT 0 - 44 U/L '11 7 8  ' No results found for: CA125  DIAGNOSTIC IMAGING:  I have independently reviewed the scans and discussed with the patient. CT CHEST ABDOMEN PELVIS W CONTRAST  Result Date: 09/01/2020 CLINICAL DATA:  Ovarian cancer restaging. Peritoneal carcinomatosis. EXAM: CT CHEST, ABDOMEN, AND PELVIS WITH CONTRAST TECHNIQUE: Multidetector CT imaging of the chest, abdomen and pelvis was performed following the standard protocol during bolus administration of intravenous contrast. CONTRAST:  6m OMNIPAQUE IOHEXOL 300 MG/ML  SOLN COMPARISON:  Multiple exams, including 07/08/2020 and 06/27/2020 FINDINGS: CT CHEST FINDINGS Cardiovascular: Coronary, aortic arch, and branch vessel atherosclerotic vascular disease. Left Port-A-Cath tip: SVC. Mediastinum/Nodes: Left supraclavicular node 0.6 cm in short axis on image 10 of series 2, stable. Right lower paratracheal node 1.2 cm in short axis on image 24 series 2, stable. Right hilar lymph node 1.0 cm in short axis on image 28 of series 2, stable. Right eccentric subcarinal lymph node is improved, currently 1.0 cm in diameter and previously 1.3 cm. Small type 1 hiatal hernia. Lungs/Pleura: Centrilobular emphysema. New 4 mm right upper lobe lung nodule on image 55 of series 3. Airway thickening is present, suggesting bronchitis or reactive airways disease. Mild scarring in both lung apices. Lingular scarring. Scarring or atelectasis laterally in the left  lower lobe and along the left hemidiaphragm, with airway plugging in the left lower lobe. Musculoskeletal: Old left rib deformities from healed fractures. CT ABDOMEN PELVIS FINDINGS Hepatobiliary: Complex appearance of the gallbladder likely from tumor deposition along the gallbladder wall, similar to prior. Fluid density in thickening along the posterior capsular margin of the right  hepatic lobe measuring up to 0.7 cm in thickness on image 57 of series 2, previously up to 1.2 cm in thickness. No biliary dilatation. 0.4 cm hypodense lesion in the periphery of the right hepatic lobe on image 57 of series 2 is stable and potentially a cyst. Pancreas: Unremarkable Spleen: Unremarkable Adrenals/Urinary Tract: Unremarkable Stomach/Bowel: Sigmoid colon diverticulosis. Vascular/Lymphatic: Aortoiliac atherosclerotic vascular disease. Peripancreatic node 1.0 cm in short axis on image 61 of series 2, formerly 1.2 cm. Right gastric node 0.8 cm in short axis on image 52 of series 2, formerly the same. Peripancreatic node 1.1 cm in short axis on image 58 of series 2, previously 1.0 cm. Portacaval node 1.2 cm in short axis on image 60 of series 2, previously 1.3 cm. Scattered enlarged mesenteric lymph nodes are present including a 1.0 cm mesenteric node on image 82 of series 2, formerly 0.9 cm. Reproductive: Uterine fibroids. Indistinctness of tissue planes along the ovarian margins of the uterus, somewhat obscuring the ovaries, although with subjectively reduced prominence of the adnexa compared to the prior exam. Other: Omental caking of tumor. This measures up to about 3.1 cm in thickness on image 76 of series 2, previously 3.7 cm in thickness. Subjectively the omental caking of tumor is mildly reduced. Musculoskeletal: Unremarkable IMPRESSION: 1. Overall mild improvement, with mild reduction in omental caking of tumor. Stable to minimally reduced mild adenopathy in the chest and abdomen. 2. New 4 mm right upper lobe lung nodule on image 55 of series 3. This could be inflammatory or neoplastic, surveillance suggested. 3. Other imaging findings of potential clinical significance: Coronary atherosclerosis. Small type 1 hiatal hernia. Airway thickening is present, suggesting bronchitis or reactive airways disease. Sigmoid colon diverticulosis. Uterine fibroids. Suspected tumor deposition along the gallbladder  margin, similar to prior. 4. Emphysema and aortic atherosclerosis. Aortic Atherosclerosis (ICD10-I70.0) and Emphysema (ICD10-J43.9). Electronically Signed   By: Van Clines M.D.   On: 09/01/2020 13:38     ASSESSMENT:  1. Peritoneal carcinomatosis: -Presentation to the ER with right lower quadrant abdominal pain on and off for 1 month. -CTAP on 05/29/2020 showed extensive nodularity throughout the omentum and upper peritoneal cavity. Both ovaries are prominent although no well-defined mass is noted.Small volume ascites. -CA-125 is elevated at 2407. CEA was 2.4. -CT of the chest shows 11 mm right paratracheal lymph node and 11 mm short axis precarinal lymph node. Subcarinal lymphadenopathy measuring 14 mm short axis. This is suspicious for metastatic disease. 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 -Cycle 1 of carboplatin and paclitaxel on 07/05/2020. -Evaluated by Dr. Denman George on 07/07/2020. Reevaluation after 3 cycles. -Possible surgical resection after 6 cycles. -CT CAP from 09/01/2020 showed mild improvement with reduction in the omental caking.  Stable to minimally reduced adenopathy in the chest and abdomen.  New 4 mm right upper lobe lung nodule could be inflammatory.  2. COPD: -Quit smoking 6 years ago. Smoked 2 packs/day for 25-30 years. -Has been on3 L/min oxygen via nasal cannulafor the last 5 years.  3. Family history: -Paternal grandmother with colon cancer.   PLAN:  1.High-grade serous ovarian carcinoma: -She has tolerated cycle 4 very well. -Her CA-125 improved to 140. -Reviewed her labs which showed white count 2.4 with ANC around 1400.  Platelet count is normal.  LFTs are normal. -We'll proceed with her next cycle today.  Paclitaxel is dose reduced at 140 mg per metered square. -RTC 3 weeks for  follow-up.  Plan to repeat scans after cycle 6.  2. Family history: -Genetic testing showed BRCA1/2 negative.  3. Peripheral neuropathy: -No worsening of neuropathy since paclitaxel started.  Continue gabapentin 300 mg 3 times daily.  4. Hypokalemia: -She stopped taking potassium as she ran out of them 3 days ago.  Potassium is normal today at 3.9.  5. Severe COPD: -Continue prednisone 5 mg daily.  Continue Trelegy and Xopenex.   Orders placed this encounter:  No orders of the defined types were placed in this encounter.    Derek Jack, MD [place] [phone]   I, Milinda Antis, am acting as a scribe for Dr. Sanda Linger.  I, Derek Jack MD, have reviewed the above documentation for accuracy and completeness, and I agree with the above.

## 2020-09-27 NOTE — Progress Notes (Signed)
Pt here for D1C5.  Labs reviewed with Dr Raliegh Ip, hemoglobin 7.5, WBC 2.4, ANC 1.4.  Okay for treatment today.  PRBCs to be given next tuesday.   Tolerated treatment well today without incidence.  Vital signs stable prior to discharge.  Discharged in stable condition ambulatory.

## 2020-09-27 NOTE — Patient Instructions (Signed)
[place] at [facility] Discharge Instructions  You were seen today by [name]. He went over your recent results. You received your treatment today. You will be scheduled for a blood transfusion on Friday. [name] will see you back in 3 weeks for labs and follow up.   Thank you for choosing [place] at [facility] to provide your oncology and hematology care.  To afford each patient quality time with our provider, please arrive at least 15 minutes before your scheduled appointment time.   If you have a lab appointment with the [facility] please come in thru the Main Entrance and check in at the main information desk  You need to re-schedule your appointment should you arrive 10 or more minutes late.  We strive to give you quality time with our providers, and arriving late affects you and other patients whose appointments are after yours.  Also, if you no show three or more times for appointments you may be dismissed from the clinic at the providers discretion.     Again, thank you for choosing [facility].  Our hope is that these requests will decrease the amount of time that you wait before being seen by our physicians.       _____________________________________________________________  Should you have questions after your visit to [facility], please contact our office at [phone] between the hours of 8:00 a.m. and 4:30 p.m.  Voicemails left after 4:00 p.m. will not be returned until the following business day.  For prescription refill requests, have your pharmacy contact our office and allow 72 hours.    Cancer Center Support Programs:   > Cancer Support Group  2nd Tuesday of the month 1pm-2pm, Journey Room

## 2020-09-27 NOTE — Patient Instructions (Signed)
[facility] Discharge Instructions for Patients Receiving Chemotherapy  Today you received the following chemotherapy agents   To help prevent nausea and vomiting after your treatment, we encourage you to take your nausea medication   If you develop nausea and vomiting that is not controlled by your nausea medication, call the clinic.   BELOW ARE SYMPTOMS THAT SHOULD BE REPORTED IMMEDIATELY:  *FEVER GREATER THAN 100.5 F  *CHILLS WITH OR WITHOUT FEVER  NAUSEA AND VOMITING THAT IS NOT CONTROLLED WITH YOUR NAUSEA MEDICATION  *UNUSUAL SHORTNESS OF BREATH  *UNUSUAL BRUISING OR BLEEDING  TENDERNESS IN MOUTH AND THROAT WITH OR WITHOUT PRESENCE OF ULCERS  *URINARY PROBLEMS  *BOWEL PROBLEMS  UNUSUAL RASH Items with * indicate a potential emergency and should be followed up as soon as possible.  Feel free to call the clinic should you have any questions or concerns. The clinic phone number is [phone].  Please show the CHEMO ALERT CARD at check-in to the Emergency Department and triage nurse.   

## 2020-09-28 ENCOUNTER — Encounter (HOSPITAL_COMMUNITY): Payer: Medicare HMO

## 2020-09-28 LAB — CA 125: Cancer Antigen (CA) 125: 75.4 U/mL — ABNORMAL HIGH (ref 0.0–38.1)

## 2020-09-29 ENCOUNTER — Inpatient Hospital Stay (HOSPITAL_COMMUNITY): Payer: Medicare HMO

## 2020-09-29 ENCOUNTER — Other Ambulatory Visit: Payer: Self-pay

## 2020-09-29 VITALS — BP 115/51 | HR 83 | Temp 97.2°F | Resp 18

## 2020-09-29 DIAGNOSIS — Z95828 Presence of other vascular implants and grafts: Secondary | ICD-10-CM

## 2020-09-29 DIAGNOSIS — Z5111 Encounter for antineoplastic chemotherapy: Secondary | ICD-10-CM | POA: Diagnosis not present

## 2020-09-29 DIAGNOSIS — C786 Secondary malignant neoplasm of retroperitoneum and peritoneum: Secondary | ICD-10-CM

## 2020-09-29 MED ORDER — PEGFILGRASTIM-CBQV 6 MG/0.6ML ~~LOC~~ SOSY
6.0000 mg | PREFILLED_SYRINGE | Freq: Once | SUBCUTANEOUS | Status: AC
  Administered 2020-09-29: 6 mg via SUBCUTANEOUS
  Filled 2020-09-29: qty 0.6

## 2020-09-29 NOTE — Progress Notes (Signed)
Patient tolerated Udenyca injection with no complaints voiced.  Site clean and dry with no bruising or swelling noted.  No complaints of pain.  Discharged with vital signs stable and no signs or symptoms of distress noted.  

## 2020-09-29 NOTE — Progress Notes (Signed)
Nutrition Follow-up:  Patient with ovarian carcinoma.  Patient receiving neoadjuvant chemotherapy.   Met with patient and boyfriend prior to injection.  Patient reports that she has been eating more. Likes to snack vs eating big meals.  Snacks on fruit, ice cream, chips and dip.  Has not been drinking carnation breakfast essentials (none this week).  Boyfriend usually prepares meals.  Patient has frozen meals to prepare during the day.      Medications: reviewed  Labs: reviewed  Anthropometrics:   Weight increased 117 lb 6.4 oz on 11/10 from 116 lb on 10/20.    125 lb on 7/21   NUTRITION DIAGNOSIS: Unintentional weight loss improving   INTERVENTION:  Encouraged patient to consume high calorie, high protein foods frequently to continue weight trending up.      MONITORING, EVALUATION, GOAL: weight trends, intake   NEXT VISIT: Dec 3 after injection  Paulmichael Schreck B. Zenia Resides, [place], Miguel Barrera Registered Dietitian [phone] (mobile)

## 2020-10-03 ENCOUNTER — Inpatient Hospital Stay (HOSPITAL_COMMUNITY): Payer: Medicare HMO

## 2020-10-03 ENCOUNTER — Encounter (HOSPITAL_COMMUNITY): Payer: Medicare HMO

## 2020-10-03 ENCOUNTER — Other Ambulatory Visit (HOSPITAL_COMMUNITY): Payer: Medicare HMO

## 2020-10-05 ENCOUNTER — Other Ambulatory Visit: Payer: Self-pay

## 2020-10-05 ENCOUNTER — Inpatient Hospital Stay (HOSPITAL_COMMUNITY): Payer: Medicare HMO

## 2020-10-05 VITALS — BP 104/48 | HR 91 | Temp 96.8°F | Resp 18

## 2020-10-05 DIAGNOSIS — Z5111 Encounter for antineoplastic chemotherapy: Secondary | ICD-10-CM | POA: Diagnosis not present

## 2020-10-05 DIAGNOSIS — D649 Anemia, unspecified: Secondary | ICD-10-CM

## 2020-10-05 LAB — PREPARE RBC (CROSSMATCH)

## 2020-10-05 MED ORDER — SODIUM CHLORIDE 0.9% FLUSH
10.0000 mL | Freq: Once | INTRAVENOUS | Status: AC
  Administered 2020-10-05: 10 mL via INTRAVENOUS

## 2020-10-05 MED ORDER — ACETAMINOPHEN 325 MG PO TABS
ORAL_TABLET | ORAL | Status: AC
  Filled 2020-10-05: qty 2

## 2020-10-05 MED ORDER — HEPARIN SOD (PORK) LOCK FLUSH 100 UNIT/ML IV SOLN
500.0000 [IU] | Freq: Every day | INTRAVENOUS | Status: AC | PRN
  Administered 2020-10-05: 500 [IU]

## 2020-10-05 MED ORDER — ACETAMINOPHEN 325 MG PO TABS
650.0000 mg | ORAL_TABLET | Freq: Once | ORAL | Status: AC
  Administered 2020-10-05: 650 mg via ORAL

## 2020-10-05 MED ORDER — SODIUM CHLORIDE 0.9% IV SOLUTION
250.0000 mL | Freq: Once | INTRAVENOUS | Status: AC
  Administered 2020-10-05: 250 mL via INTRAVENOUS

## 2020-10-05 MED ORDER — DIPHENHYDRAMINE HCL 25 MG PO CAPS
25.0000 mg | ORAL_CAPSULE | Freq: Once | ORAL | Status: AC
  Administered 2020-10-05: 25 mg via ORAL

## 2020-10-05 MED ORDER — DIPHENHYDRAMINE HCL 25 MG PO CAPS
ORAL_CAPSULE | ORAL | Status: AC
  Filled 2020-10-05: qty 1

## 2020-10-05 NOTE — Progress Notes (Signed)
Patient presents today for blood products.  Vital signs stable.  No new complaints.    Blood given today per MD orders.  Tolerated infusion without adverse affects.  Vital signs stable.  No complaints at this time.  Discharge from clinic via wheelchair in stable condition.  Alert and oriented X 3.  Follow up with [facility] as scheduled.

## 2020-10-05 NOTE — Patient Instructions (Signed)
[place] Discharge Instructions for Patients Receiving Blood products  Today you received the following 1 Unit of Packed red Blood Cells To help prevent nausea and vomiting after your treatment, we encourage you to take your nausea medication  If you develop nausea and vomiting that is not controlled by your nausea medication, call the clinic.   BELOW ARE SYMPTOMS THAT SHOULD BE REPORTED IMMEDIATELY:  *FEVER GREATER THAN 100.5 F  *CHILLS WITH OR WITHOUT FEVER  NAUSEA AND VOMITING THAT IS NOT CONTROLLED WITH YOUR NAUSEA MEDICATION  *UNUSUAL SHORTNESS OF BREATH  *UNUSUAL BRUISING OR BLEEDING  TENDERNESS IN MOUTH AND THROAT WITH OR WITHOUT PRESENCE OF ULCERS  *URINARY PROBLEMS  *BOWEL PROBLEMS  UNUSUAL RASH Items with * indicate a potential emergency and should be followed up as soon as possible.  Feel free to call the clinic should you have any questions or concerns. The clinic phone number is [phone].  Please show the [place] at check-in to the Emergency Department and triage nurse.

## 2020-10-06 LAB — BPAM RBC
Blood Product Expiration Date: 202112182359
ISSUE DATE / TIME: 202111180938
Unit Type and Rh: 9500

## 2020-10-06 LAB — TYPE AND SCREEN
ABO/RH(D): O NEG
Antibody Screen: NEGATIVE
Unit division: 0

## 2020-10-16 ENCOUNTER — Other Ambulatory Visit: Payer: Self-pay

## 2020-10-16 ENCOUNTER — Encounter (HOSPITAL_COMMUNITY): Payer: Self-pay

## 2020-10-16 ENCOUNTER — Emergency Department (HOSPITAL_COMMUNITY): Payer: Medicare HMO

## 2020-10-16 ENCOUNTER — Emergency Department (HOSPITAL_COMMUNITY)
Admission: EM | Admit: 2020-10-16 | Discharge: 2020-10-16 | Disposition: A | Discharge status: Home / Self Care | Payer: Medicare HMO | Attending: Emergency Medicine | Admitting: Emergency Medicine

## 2020-10-16 DIAGNOSIS — Z7951 Long term (current) use of inhaled steroids: Secondary | ICD-10-CM | POA: Insufficient documentation

## 2020-10-16 DIAGNOSIS — I10 Essential (primary) hypertension: Secondary | ICD-10-CM | POA: Insufficient documentation

## 2020-10-16 DIAGNOSIS — Z20822 Contact with and (suspected) exposure to covid-19: Secondary | ICD-10-CM | POA: Insufficient documentation

## 2020-10-16 DIAGNOSIS — Z87891 Personal history of nicotine dependence: Secondary | ICD-10-CM | POA: Insufficient documentation

## 2020-10-16 DIAGNOSIS — J441 Chronic obstructive pulmonary disease with (acute) exacerbation: Secondary | ICD-10-CM | POA: Diagnosis not present

## 2020-10-16 DIAGNOSIS — Z85028 Personal history of other malignant neoplasm of stomach: Secondary | ICD-10-CM | POA: Insufficient documentation

## 2020-10-16 DIAGNOSIS — Z79899 Other long term (current) drug therapy: Secondary | ICD-10-CM | POA: Diagnosis not present

## 2020-10-16 DIAGNOSIS — R0602 Shortness of breath: Secondary | ICD-10-CM | POA: Diagnosis present

## 2020-10-16 LAB — RESP PANEL BY RT-PCR (FLU A&B, COVID) ARPGX2
Influenza A by PCR: NEGATIVE
Influenza B by PCR: NEGATIVE
SARS Coronavirus 2 by RT PCR: NEGATIVE

## 2020-10-16 LAB — CBC WITH DIFFERENTIAL/PLATELET
Abs Immature Granulocytes: 0.06 10*3/uL (ref 0.00–0.07)
Basophils Absolute: 0 10*3/uL (ref 0.0–0.1)
Basophils Relative: 0 %
Eosinophils Absolute: 0 10*3/uL (ref 0.0–0.5)
Eosinophils Relative: 0 %
HCT: 25.2 % — ABNORMAL LOW (ref 36.0–46.0)
Hemoglobin: 8.3 g/dL — ABNORMAL LOW (ref 12.0–15.0)
Immature Granulocytes: 1 %
Lymphocytes Relative: 7 %
Lymphs Abs: 0.7 10*3/uL (ref 0.7–4.0)
MCH: 32 pg (ref 26.0–34.0)
MCHC: 32.9 g/dL (ref 30.0–36.0)
MCV: 97.3 fL (ref 80.0–100.0)
Monocytes Absolute: 0.6 10*3/uL (ref 0.1–1.0)
Monocytes Relative: 6 %
Neutro Abs: 8.4 10*3/uL — ABNORMAL HIGH (ref 1.7–7.7)
Neutrophils Relative %: 86 %
Platelets: 120 10*3/uL — ABNORMAL LOW (ref 150–400)
RBC: 2.59 MIL/uL — ABNORMAL LOW (ref 3.87–5.11)
RDW: 18.6 % — ABNORMAL HIGH (ref 11.5–15.5)
WBC: 9.9 10*3/uL (ref 4.0–10.5)
nRBC: 0 % (ref 0.0–0.2)

## 2020-10-16 LAB — BASIC METABOLIC PANEL
Anion gap: 9 (ref 5–15)
BUN: 10 mg/dL (ref 8–23)
CO2: 29 mmol/L (ref 22–32)
Calcium: 8.7 mg/dL — ABNORMAL LOW (ref 8.9–10.3)
Chloride: 97 mmol/L — ABNORMAL LOW (ref 98–111)
Creatinine, Ser: 0.58 mg/dL (ref 0.44–1.00)
GFR, Estimated: >60 mL/min (ref >60)
Glucose, Bld: 114 mg/dL — ABNORMAL HIGH (ref 70–99)
Potassium: 3.5 mmol/L (ref 3.5–5.1)
Sodium: 135 mmol/L (ref 135–145)

## 2020-10-16 MED ORDER — DOXYCYCLINE HYCLATE 100 MG PO TABS
100.0000 mg | ORAL_TABLET | Freq: Once | ORAL | Status: AC
  Administered 2020-10-16: 100 mg via ORAL
  Filled 2020-10-16: qty 1

## 2020-10-16 MED ORDER — DOXYCYCLINE HYCLATE 100 MG PO CAPS: 100.0000 mg | ORAL_CAPSULE | Freq: Two times a day (BID) | ORAL | 0 refills | Status: DC

## 2020-10-16 NOTE — ED Provider Notes (Signed)
[facility] EMERGENCY DEPARTMENT Provider Note  CSN: [id number] Arrival date & time: 10/16/20 1716    History Chief Complaint  Patient presents with  . Shortness of Breath    HPI  [name] is a 64 y.o. female with history of severe COPD on home O2 and ovarian cancer currently undergoing chemo treatments. She reports earlier today she had increased SOB, coughing up green sputum, improved some with a neb prior to arrival. Denies any fever or chest pain.    Past Medical History:  Diagnosis Date  . Anemia   . Asthma   . Cancer [facility])    Gastric Cancer  . COPD (chronic obstructive pulmonary disease) ([place])   . High cholesterol   . Hypertension   . Neuropathy   . Pneumonia 2019  . Port-A-Cath in place 07/03/2020    Past Surgical History:  Procedure Laterality Date  . HALLUX VALGUS BASE WEDGE Right 06/09/2015   Procedure: Base wedge osteotomy with modified McBride right foot ;  Surgeon: Sharlotte Alamo, MD;  Location: [facility] ORS;  Service: Podiatry;  Laterality: Right;  . PORTACATH PLACEMENT Left 06/28/2020   Procedure: PORT-A-CATHETER PLACEMENT LEFT CHEST (attached catheter in left subclavian);  Surgeon: Virl Cagey, MD;  Location: [facility] ORS;  Service: General;  Laterality: Left;    Family History  Problem Relation Age of Onset  . Alzheimer's disease Mother   . COPD Father   . Emphysema Father   . Hypertension Father   . Healthy Sister   . Healthy Brother   . Alzheimer's disease Maternal Grandmother   . Healthy Sister   . Healthy Sister   . Healthy Sister   . Prostate cancer Other        paternal grandmother's brother; dx in early 30s  . Breast cancer Neg Hx     Social History   Tobacco Use  . Smoking status: Former Smoker    Packs/day: 2.00    Years: 30.00    Pack years: 60.00    Types: Cigarettes    Quit date: 11/04/2013    Years since quitting: 6.9  . Smokeless tobacco: Never Used  Vaping Use  . Vaping Use: Never used  Substance Use Topics  .  Alcohol use: No  . Drug use: No     Home Medications Prior to Admission medications   Medication Sig Start Date End Date Taking? Authorizing Provider  acetylcysteine (MUCOMYST) 20 % nebulizer solution Inhale into the lungs.  08/14/20 08/14/21 Yes [provider]  albuterol (PROVENTIL HFA;VENTOLIN HFA) 108 (90 BASE) MCG/ACT inhaler Inhale 4-6 puffs into the lungs every 6 (six) hours as needed for wheezing or shortness of breath.    Yes [provider]  amLODipine (NORVASC) 5 MG tablet Take 5 mg by mouth at bedtime.    Yes [provider]  aspirin EC 81 MG tablet Take 81 mg by mouth daily.   Yes [provider]  CARBOPLATIN IV Inject into the vein every 21 ( twenty-one) days. 07/05/20  Yes [provider]  docusate sodium (COLACE) 100 MG capsule Take 1 capsule (100 mg total) by mouth 2 (two) times daily as needed for mild constipation. 07/08/20  Yes Virgel Manifold, MD  ferrous sulfate 325 (65 FE) MG tablet Take 325 mg by mouth 2 (two) times daily with a meal.    Yes [provider]  Fluticasone-Umeclidin-Vilant (TRELEGY ELLIPTA) 100-62.5-25 MCG/INH AEPB Inhale into the lungs. 01/21/20  Yes [provider]  gabapentin (NEURONTIN) 300 MG  capsule Take 300 mg by mouth 3 (three) times daily.    Yes [provider]  guaiFENesin (ROBITUSSIN) 100 MG/5ML liquid Take 100 mg by mouth 2 (two) times daily as needed for cough.   Yes [provider]  ipratropium (ATROVENT HFA) 17 MCG/ACT inhaler Inhale 2 puffs into the lungs every 6 (six) hours as needed for wheezing.    Yes [provider]  ipratropium-albuterol (DUONEB) 0.5-2.5 (3) MG/3ML SOLN Take 0.5 mg by nebulization every 6 (six) hours as needed.  08/03/12  Yes [provider]  levalbuterol Penne Lash) 1.25 MG/3ML nebulizer solution Inhale into the lungs. 08/21/20  Yes [provider]  lidocaine-prilocaine (EMLA) cream Apply a small amount to port a cath site  and cover with plastic wrap 1 hour prior to chemotherapy appointments 07/03/20  Yes Derek Jack, MD  nortriptyline (PAMELOR) 25 MG capsule Take 25 mg by mouth at bedtime.   Yes [provider]  ondansetron (ZOFRAN ODT) 4 MG disintegrating tablet 4mg  ODT q4 hours prn nausea/vomit 07/08/20  Yes Milton Ferguson, MD  oxyCODONE-acetaminophen (PERCOCET/ROXICET) 5-325 MG tablet Take 1 tablet by mouth every 4 (four) hours as needed for severe pain. 07/08/20  Yes Virgel Manifold, MD  OXYGEN Inhale 3 L into the lungs continuous.    Yes [provider]  PACLITAXEL IV Inject into the vein every 21 ( twenty-one) days. 07/05/20  Yes [provider]  pantoprazole (PROTONIX) 40 MG tablet Take 1 tablet (40 mg total) by mouth daily. 01/28/18  Yes Gladstone Lighter, MD  prochlorperazine (COMPAZINE) 10 MG tablet Take 1 tablet (10 mg total) by mouth every 6 (six) hours as needed (Nausea or vomiting). 07/03/20  Yes Derek Jack, MD  simvastatin (ZOCOR) 40 MG tablet Take 40 mg by mouth at bedtime.   Yes [provider]  doxycycline (VIBRAMYCIN) 100 MG capsule Take 1 capsule (100 mg total) by mouth 2 (two) times daily. 10/16/20   Truddie Hidden, MD  predniSONE (DELTASONE) 5 MG tablet Take 5 mg by mouth daily with breakfast. Patient not taking: Reported on 10/16/2020    [provider]     Allergies    Patient has no known allergies.   Review of Systems   Review of Systems A comprehensive review of systems was completed and negative except as noted in HPI.    Physical Exam BP 132/77 (BP Location: Right Arm)   Pulse 99   Temp 97.9 F (36.6 C) (Oral)   Resp 19   Ht 5' (1.524 m)   Wt 52.2 kg   SpO2 100%   BMI 22.46 kg/m   Physical Exam Vitals and nursing note reviewed.  Constitutional:      Appearance: Normal appearance.  HENT:     Head: Normocephalic and atraumatic.     Nose: Nose normal.     Mouth/Throat:     Mouth: Mucous membranes are  moist.  Eyes:     Extraocular Movements: Extraocular movements intact.     Conjunctiva/sclera: Conjunctivae normal.  Cardiovascular:     Rate and Rhythm: Normal rate.  Pulmonary:     Effort: Pulmonary effort is normal.     Breath sounds: Wheezing present.  Abdominal:     General: Abdomen is flat.     Palpations: Abdomen is soft.     Tenderness: There is no abdominal tenderness.  Musculoskeletal:        General: No swelling. Normal range of motion.     Cervical back: Neck supple.  Skin:  General: Skin is warm and dry.  Neurological:     General: No focal deficit present.     Mental Status: She is alert.  Psychiatric:        Mood and Affect: Mood normal.      ED Results / Procedures / Treatments   Labs (all labs ordered are listed, but only abnormal results are displayed) Labs Reviewed  BASIC METABOLIC PANEL - Abnormal; Notable for the following components:      Result Value   Chloride 97 (*)    Glucose, Bld 114 (*)    Calcium 8.7 (*)    All other components within normal limits  CBC WITH DIFFERENTIAL/PLATELET - Abnormal; Notable for the following components:   RBC 2.59 (*)    Hemoglobin 8.3 (*)    HCT 25.2 (*)    RDW 18.6 (*)    Platelets 120 (*)    Neutro Abs 8.4 (*)    All other components within normal limits  RESP PANEL BY RT-PCR (FLU A&B, COVID) [id number]    EKG EKG Interpretation  Date/Time:  Monday October 16 2020 21:07:57 EST Ventricular Rate:  100 PR Interval:    QRS Duration: 81 QT Interval:  385 QTC Calculation: 497 R Axis:   83 Text Interpretation: Sinus or ectopic atrial tachycardia Baseline wander Borderline right axis deviation Low voltage, extremity leads Abnormal T, consider ischemia, lateral leads No significant change since last tracing Confirmed by Calvert Cantor [phone]) on 10/16/2020 9:15:01 PM   Radiology DG Chest 2 View  Result Date: 10/16/2020 CLINICAL DATA:  Short of breath, cough for 1 week, history of ovarian cancer with  peritoneal carcinomatosis EXAM: CHEST - 2 VIEW COMPARISON:  09/01/2020, 07/08/2020 FINDINGS: Frontal and lateral views of the chest demonstrate a stable cardiac silhouette. Chronic emphysema without superimposed airspace disease, effusion, or pneumothorax. No acute or destructive bony lesions. Stable left chest wall port. IMPRESSION: 1. Stable emphysema and scarring.  No acute airspace disease. Electronically Signed   By: Randa Ngo M.D.   On: 10/16/2020 17:53    Procedures Procedures  Medications Ordered in the ED Medications  doxycycline (VIBRA-TABS) tablet 100 mg (has no administration in time range)     MDM Rules/Calculators/A&P MDM Patient with SOB, increased cough. No fever. CXR is stable. Resting comfortably on her home O2. Will check labs and reassess.  ED Course  I have reviewed the triage vital signs and the nursing notes.  Pertinent labs & imaging results that were available during my care of the patient were reviewed by me and considered in my medical decision making (see chart for details).  Clinical Course as of Oct 16 2149  Mon Oct 16, 2020  1948 CBC shows anemia, improved from recent labs. No leukocytosis.   [CS]  1959 BMP is normal.    [CS]  2148 Patient reports she is still feeling at her baseline. No further SOB. Vitals here have remained stable. Covid is neg. Will plan d/c with Rx for doxycycline given change in sputum and close outpatient follow up. RTED if symptoms worsen or for any other concerns.    [CS]    Clinical Course User Index [CS] Truddie Hidden, MD    Final Clinical Impression(s) / ED Diagnoses Final diagnoses:  COPD exacerbation ([place])    Rx / DC Orders ED Discharge Orders         Ordered    doxycycline (VIBRAMYCIN) 100 MG capsule  2 times daily        10/16/20  2149           Truddie Hidden, MD 10/16/20 2150

## 2020-10-16 NOTE — ED Triage Notes (Addendum)
Pt to er, pt states that about an hour ago she has been more sob, states that she has been coughing a lot more over the past week, states that her cough is productive with green sputum.  States that she is on 3L of home O2 via [place].

## 2020-10-18 ENCOUNTER — Inpatient Hospital Stay (HOSPITAL_COMMUNITY): Payer: Medicare HMO | Attending: Hematology

## 2020-10-18 ENCOUNTER — Inpatient Hospital Stay (HOSPITAL_COMMUNITY): Payer: Medicare HMO | Admitting: Hematology

## 2020-10-18 ENCOUNTER — Inpatient Hospital Stay (HOSPITAL_COMMUNITY): Payer: Medicare HMO

## 2020-10-18 ENCOUNTER — Other Ambulatory Visit: Payer: Self-pay

## 2020-10-18 ENCOUNTER — Other Ambulatory Visit (HOSPITAL_COMMUNITY): Payer: Medicare HMO

## 2020-10-18 VITALS — BP 141/70 | HR 96 | Temp 97.2°F | Resp 18 | Wt 115.7 lb

## 2020-10-18 VITALS — BP 128/64 | HR 94 | Temp 97.4°F | Resp 18

## 2020-10-18 DIAGNOSIS — C786 Secondary malignant neoplasm of retroperitoneum and peritoneum: Secondary | ICD-10-CM

## 2020-10-18 DIAGNOSIS — Z8042 Family history of malignant neoplasm of prostate: Secondary | ICD-10-CM | POA: Diagnosis not present

## 2020-10-18 DIAGNOSIS — R918 Other nonspecific abnormal finding of lung field: Secondary | ICD-10-CM | POA: Insufficient documentation

## 2020-10-18 DIAGNOSIS — E7800 Pure hypercholesterolemia, unspecified: Secondary | ICD-10-CM | POA: Diagnosis not present

## 2020-10-18 DIAGNOSIS — Z800 Family history of malignant neoplasm of digestive organs: Secondary | ICD-10-CM | POA: Diagnosis not present

## 2020-10-18 DIAGNOSIS — Z8249 Family history of ischemic heart disease and other diseases of the circulatory system: Secondary | ICD-10-CM | POA: Diagnosis not present

## 2020-10-18 DIAGNOSIS — J449 Chronic obstructive pulmonary disease, unspecified: Secondary | ICD-10-CM | POA: Diagnosis not present

## 2020-10-18 DIAGNOSIS — G629 Polyneuropathy, unspecified: Secondary | ICD-10-CM | POA: Diagnosis not present

## 2020-10-18 DIAGNOSIS — Z7982 Long term (current) use of aspirin: Secondary | ICD-10-CM | POA: Diagnosis not present

## 2020-10-18 DIAGNOSIS — C569 Malignant neoplasm of unspecified ovary: Secondary | ICD-10-CM | POA: Insufficient documentation

## 2020-10-18 DIAGNOSIS — Z79899 Other long term (current) drug therapy: Secondary | ICD-10-CM | POA: Diagnosis not present

## 2020-10-18 DIAGNOSIS — Z87891 Personal history of nicotine dependence: Secondary | ICD-10-CM | POA: Diagnosis not present

## 2020-10-18 DIAGNOSIS — I10 Essential (primary) hypertension: Secondary | ICD-10-CM | POA: Insufficient documentation

## 2020-10-18 DIAGNOSIS — Z95828 Presence of other vascular implants and grafts: Secondary | ICD-10-CM

## 2020-10-18 DIAGNOSIS — Z5189 Encounter for other specified aftercare: Secondary | ICD-10-CM | POA: Insufficient documentation

## 2020-10-18 DIAGNOSIS — Z5111 Encounter for antineoplastic chemotherapy: Secondary | ICD-10-CM | POA: Insufficient documentation

## 2020-10-18 LAB — CBC WITH DIFFERENTIAL/PLATELET
Abs Immature Granulocytes: 0.01 10*3/uL (ref 0.00–0.07)
Basophils Absolute: 0 10*3/uL (ref 0.0–0.1)
Basophils Relative: 1 %
Eosinophils Absolute: 0 10*3/uL (ref 0.0–0.5)
Eosinophils Relative: 1 %
HCT: 25.5 % — ABNORMAL LOW (ref 36.0–46.0)
Hemoglobin: 8.3 g/dL — ABNORMAL LOW (ref 12.0–15.0)
Immature Granulocytes: 0 %
Lymphocytes Relative: 20 %
Lymphs Abs: 0.8 10*3/uL (ref 0.7–4.0)
MCH: 32 pg (ref 26.0–34.0)
MCHC: 32.5 g/dL (ref 30.0–36.0)
MCV: 98.5 fL (ref 80.0–100.0)
Monocytes Absolute: 0.5 10*3/uL (ref 0.1–1.0)
Monocytes Relative: 13 %
Neutro Abs: 2.5 10*3/uL (ref 1.7–7.7)
Neutrophils Relative %: 65 %
Platelets: 152 10*3/uL (ref 150–400)
RBC: 2.59 MIL/uL — ABNORMAL LOW (ref 3.87–5.11)
RDW: 19.3 % — ABNORMAL HIGH (ref 11.5–15.5)
WBC: 3.9 10*3/uL — ABNORMAL LOW (ref 4.0–10.5)
nRBC: 0 % (ref 0.0–0.2)

## 2020-10-18 LAB — COMPREHENSIVE METABOLIC PANEL
ALT: 8 U/L (ref 0–44)
AST: 16 U/L (ref 15–41)
Albumin: 3.6 g/dL (ref 3.5–5.0)
Alkaline Phosphatase: 77 U/L (ref 38–126)
Anion gap: 9 (ref 5–15)
BUN: 12 mg/dL (ref 8–23)
CO2: 31 mmol/L (ref 22–32)
Calcium: 8.7 mg/dL — ABNORMAL LOW (ref 8.9–10.3)
Chloride: 97 mmol/L — ABNORMAL LOW (ref 98–111)
Creatinine, Ser: 0.54 mg/dL (ref 0.44–1.00)
GFR, Estimated: >60 mL/min (ref >60)
Glucose, Bld: 99 mg/dL (ref 70–99)
Potassium: 3.4 mmol/L — ABNORMAL LOW (ref 3.5–5.1)
Sodium: 137 mmol/L (ref 135–145)
Total Bilirubin: 0.6 mg/dL (ref 0.3–1.2)
Total Protein: 6.4 g/dL — ABNORMAL LOW (ref 6.5–8.1)

## 2020-10-18 MED ORDER — SODIUM CHLORIDE 0.9 % IV SOLN
10.0000 mg | Freq: Once | INTRAVENOUS | Status: AC
  Administered 2020-10-18: 10 mg via INTRAVENOUS
  Filled 2020-10-18: qty 10

## 2020-10-18 MED ORDER — PALONOSETRON HCL INJECTION 0.25 MG/5ML
0.2500 mg | Freq: Once | INTRAVENOUS | Status: AC
  Administered 2020-10-18: 0.25 mg via INTRAVENOUS
  Filled 2020-10-18: qty 5

## 2020-10-18 MED ORDER — DIPHENHYDRAMINE HCL 50 MG/ML IJ SOLN
50.0000 mg | Freq: Once | INTRAMUSCULAR | Status: AC
  Administered 2020-10-18: 50 mg via INTRAVENOUS
  Filled 2020-10-18: qty 1

## 2020-10-18 MED ORDER — SODIUM CHLORIDE 0.9 % IV SOLN
538.8000 mg | Freq: Once | INTRAVENOUS | Status: AC
  Administered 2020-10-18: 540 mg via INTRAVENOUS
  Filled 2020-10-18: qty 54

## 2020-10-18 MED ORDER — FAMOTIDINE IN NACL 20-0.9 MG/50ML-% IV SOLN
20.0000 mg | Freq: Once | INTRAVENOUS | Status: AC
  Administered 2020-10-18: 20 mg via INTRAVENOUS
  Filled 2020-10-18: qty 50

## 2020-10-18 MED ORDER — SODIUM CHLORIDE 0.9% FLUSH
10.0000 mL | INTRAVENOUS | Status: DC | PRN
  Administered 2020-10-18 (×2): 10 mL

## 2020-10-18 MED ORDER — HEPARIN SOD (PORK) LOCK FLUSH 100 UNIT/ML IV SOLN
500.0000 [IU] | Freq: Once | INTRAVENOUS | Status: AC | PRN
  Administered 2020-10-18: 500 [IU]

## 2020-10-18 MED ORDER — SODIUM CHLORIDE 0.9 % IV SOLN
150.0000 mg | Freq: Once | INTRAVENOUS | Status: AC
  Administered 2020-10-18: 150 mg via INTRAVENOUS
  Filled 2020-10-18: qty 150

## 2020-10-18 MED ORDER — SODIUM CHLORIDE 0.9 % IV SOLN
140.0000 mg/m2 | Freq: Once | INTRAVENOUS | Status: AC
  Administered 2020-10-18: 216 mg via INTRAVENOUS
  Filled 2020-10-18: qty 36

## 2020-10-18 MED ORDER — SODIUM CHLORIDE 0.9 % IV SOLN: Freq: Once | INTRAVENOUS | Status: AC

## 2020-10-18 NOTE — Patient Instructions (Signed)
[facility] Discharge Instructions for Patients Receiving Chemotherapy   Beginning January 23rd 2017 lab work for the Cancer Center will be done in the  Main lab at [place] on 1st floor. If you have a lab appointment with the Cancer Center please come in thru the  Main Entrance and check in at the main information desk   Today you received the following chemotherapy agents Taxol and Carboplatin. Follow-up as scheduled  To help prevent nausea and vomiting after your treatment, we encourage you to take your nausea medication   If you develop nausea and vomiting, or diarrhea that is not controlled by your medication, call the clinic.  The clinic phone number is [phone]. Office hours are Monday-Friday 8:30am-5:00pm.  BELOW ARE SYMPTOMS THAT SHOULD BE REPORTED IMMEDIATELY:  *FEVER GREATER THAN 101.0 F  *CHILLS WITH OR WITHOUT FEVER  NAUSEA AND VOMITING THAT IS NOT CONTROLLED WITH YOUR NAUSEA MEDICATION  *UNUSUAL SHORTNESS OF BREATH  *UNUSUAL BRUISING OR BLEEDING  TENDERNESS IN MOUTH AND THROAT WITH OR WITHOUT PRESENCE OF ULCERS  *URINARY PROBLEMS  *BOWEL PROBLEMS  UNUSUAL RASH Items with * indicate a potential emergency and should be followed up as soon as possible. If you have an emergency after office hours please contact your primary care physician or go to the nearest emergency department.  Please call the clinic during office hours if you have any questions or concerns.   You may also contact the Patient Navigator at [phone] should you have any questions or need assistance in obtaining follow up care.      Resources For Cancer Patients and their Caregivers ? American [employer] Society: Can assist with transportation, wigs, general needs, runs Look Good Feel Better.        1-888-227-6333 ? Cancer Care: Provides financial assistance, online support groups, medication/co-pay assistance.  1-800-813-HOPE (4673) ? [facility] Assists [place] cancer patients and their families through emotional , educational and financial support.  336-427-4357 ? [place] DSS Where to apply for food stamps, Medicaid and utility assistance. 336-342-1394 ? [facility]: Transportation to medical appointments. 336-347-2287 ? Social Security Administration: May apply for disability if have a Stage IV cancer. 336-342-7796 1-800-772-1213 ? [place] [facility]ging, Disability and Transit Services: Assists with nutrition, care and transit needs. 336-349-2343         

## 2020-10-18 NOTE — Progress Notes (Signed)
[place] reviewed with and pt seen by [name] and pt approved for Taxol and Carboplatin infusions today per MD          [name] tolerated chemo tx well without complaints or incident. VSS upon discharge. Pt discharged via wheelchair in satisfactory condition accompanied by her sister

## 2020-10-18 NOTE — Progress Notes (Signed)
[name], [name] [id number]   CLINIC:  Medical Oncology/Hematology  PCP:  [name] [id number] [phone]   REASON FOR VISIT:  Follow-up for high-grade serous ovarian carcinoma  PRIOR THERAPY: None  NGS Results: BRCA 1/2 negative  CURRENT THERAPY: Carboplatin, paclitaxel & Aloxi every 3 weeks  BRIEF ONCOLOGIC HISTORY:  Oncology History  Peritoneal carcinomatosis ([name])  06/06/2020 Initial Diagnosis   Peritoneal carcinomatosis ([name])   07/05/2020 -  Chemotherapy   The patient had palonosetron (ALOXI) injection 0.25 mg, 0.25 mg, Intravenous,  Once, 5 of 6 cycles Administration: 0.25 mg (07/05/2020), 0.25 mg (07/26/2020), 0.25 mg (08/16/2020), 0.25 mg (09/06/2020), 0.25 mg (09/27/2020) pegfilgrastim-cbqv (UDENYCA) injection 6 mg, 6 mg, Subcutaneous, Once, 5 of 6 cycles Administration: 6 mg (07/07/2020), 6 mg (07/28/2020), 6 mg (08/18/2020), 6 mg (09/08/2020), 6 mg (09/29/2020) CARBOplatin (PARAPLATIN) 540 mg in sodium chloride 0.9 % 250 mL chemo infusion, 540 mg (100 % of original dose 543.6 mg), Intravenous,  Once, 5 of 6 cycles Dose modification:   (original dose 543.6 mg, Cycle 1), 540 mg (original dose 540 mg, Cycle 2),   (original dose 538.8 mg, Cycle 3),   (original dose 538.8 mg, Cycle 4),   (original dose 538.8 mg, Cycle 5),   (original dose 538.8 mg, Cycle 6) Administration: 540 mg (07/05/2020), 540 mg (07/26/2020), 540 mg (08/16/2020), 540 mg (09/06/2020), 540 mg (09/27/2020) fosaprepitant (EMEND) 150 mg in sodium chloride 0.9 % 145 mL IVPB, 150 mg, Intravenous,  Once, 5 of 6 cycles Administration: 150 mg (07/05/2020), 150 mg (07/26/2020), 150 mg (08/16/2020), 150 mg (09/06/2020), 150 mg (09/27/2020) PACLitaxel (TAXOL) 216 mg in sodium chloride 0.9 % 250 mL chemo infusion (> 71m/m2), 140 mg/m2 = 216 mg (80 % of original dose 175 mg/m2), Intravenous,  Once, 5 of 6 cycles Dose modification: 140 mg/m2 (80 % of  original dose 175 mg/m2, Cycle 1, Reason: Other (see comments), Comment: neuropathy) Administration: 216 mg (07/05/2020), 216 mg (07/26/2020), 216 mg (08/16/2020), 216 mg (09/06/2020), 216 mg (09/27/2020)  for chemotherapy treatment.    07/22/2020 Genetic Testing   Negative genetic testing: no pathogenic variants detected in [facility] Expanded CancerNext Panel + RNAInsight. The CancerNext-Expanded gene panel offered by [facility] includes sequencing and rearrangement analysis for the following 77 genes: AIP, ALK, APC*, ATM*, AXIN2, BAP1, BARD1, BLM, BMPR1A, BRCA1*, BRCA2*, BRIP1*, CDC73, CDH1*, CDK4, CDKN1B, CDKN2A, CHEK2*, CTNNA1, DICER1, FANCC, FH, FLCN, GALNT12, KIF1B, LZTR1, MAX, MEN1, MET, MLH1*, MSH2*, MSH3, MSH6*, MUTYH*, NBN, NF1*, NF2, NTHL1, PALB2*, PHOX2B, PMS2*, POT1, PRKAR1A, PTCH1, PTEN*, RAD51C*, RAD51D*, RB1, RECQL, RET, SDHA, SDHAF2, SDHB, SDHC, SDHD, SMAD4, SMARCA4, SMARCB1, SMARCE1, STK11, SUFU, TMEM127, TP53*, TSC1, TSC2, VHL and XRCC2 (sequencing and deletion/duplication); EGFR, EGLN1, HOXB13, KIT, MITF, PDGFRA, POLD1, and POLE (sequencing only); EPCAM and GREM1 (deletion/duplication only). DNA and RNA analyses performed for * genes. The report date is July 22, 2020.      CANCER STAGING: Cancer Staging No matching staging information was found for the patient.  INTERVAL HISTORY:  [name] Vicki 64y.o. female, returns for routine follow-up and consideration for next cycle of chemotherapy. [name] last seen on 09/27/2020.  Due for cycle #6 of carboplatin, paclitaxel and Aloxi today.   Overall, she tells me she has been feeling pretty well. She went to [facility] 11/29 for Vicki COPD exacerbation with SOB and coughing up green sputum and was discharged with doxycycline which helped; her cough  is improving. She reports that she tolerated her previous treatment well and reports that numbness and tingling in her hands and feet is stable. Her appetite is excellent and she denies  having N/V/D or F/C.  Overall, she feels ready for next cycle of chemo today.    REVIEW OF SYSTEMS:  Review of Systems  Constitutional: Positive for fatigue (50%). Negative for appetite change, chills and fever.  Respiratory: Positive for cough (improving w/ doxycycline) and shortness of breath (stable).   Gastrointestinal: Negative for diarrhea, nausea and vomiting.  Genitourinary: Positive for difficulty urinating (malodorous urine) and frequency.   Neurological: Positive for numbness (hands & feet; stable).  All other systems reviewed and are negative.   PAST MEDICAL/SURGICAL HISTORY:  Past Medical History:  Diagnosis Date  . Anemia   . Asthma   . Cancer [facility])    Gastric Cancer  . COPD (chronic obstructive pulmonary disease) ([place])   . High cholesterol   . Hypertension   . Neuropathy   . Pneumonia 2019  . Port-Vicki-Cath in place 07/03/2020   Past Surgical History:  Procedure Laterality Date  . HALLUX VALGUS BASE WEDGE Right 06/09/2015   Procedure: Base wedge osteotomy with modified McBride right foot ;  Surgeon: Sharlotte Alamo, MD;  Location: [facility] ORS;  Service: Podiatry;  Laterality: Right;  . PORTACATH PLACEMENT Left 06/28/2020   Procedure: PORT-Vicki-CATHETER PLACEMENT LEFT CHEST (attached catheter in left subclavian);  Surgeon: Virl Cagey, MD;  Location: [facility] ORS;  Service: General;  Laterality: Left;    SOCIAL HISTORY:  Social History   Socioeconomic History  . Marital status: Divorced    Spouse name: Not on file  . Number of children: 3  . Years of education: Not on file  . Highest education level: Not on file  Occupational History  . Occupation: DISABLED  Tobacco Use  . Smoking status: Former Smoker    Packs/day: 2.00    Years: 30.00    Pack years: 60.00    Types: Cigarettes    Quit date: 11/04/2013    Years since quitting: 6.9  . Smokeless tobacco: Never Used  Vaping Use  . Vaping Use: Never used  Substance and Sexual Activity  . Alcohol use: No  . Drug  use: No  . Sexual activity: Not Currently  Other Topics Concern  . Not on file  Social History Narrative  . Not on file   Social Determinants of Health   Financial Resource Strain: Low Risk   . Difficulty of Paying Living Expenses: Not hard at all  Food Insecurity: No Food Insecurity  . Worried About Charity fundraiser in the Last Year: Never true  . Ran Out of Food in the Last Year: Never true  Transportation Needs: No Transportation Needs  . Lack of Transportation (Medical): No  . Lack of Transportation (Non-Medical): No  Physical Activity: Inactive  . Days of Exercise per Week: 0 days  . Minutes of Exercise per Session: 0 min  Stress: No Stress Concern Present  . Feeling of Stress : Not at all  Social Connections: Socially Isolated  . Frequency of Communication with Friends and Family: More than three times Vicki week  . Frequency of Social Gatherings with Friends and Family: Never  . Attends Religious Services: Never  . Active Member of Clubs or Organizations: No  . Attends Archivist Meetings: Never  . Marital Status: Divorced  Human resources officer Violence: Not At Risk  . Fear of Current or Ex-Partner: No  .  Emotionally Abused: No  . Physically Abused: No  . Sexually Abused: No    FAMILY HISTORY:  Family History  Problem Relation Age of Onset  . Alzheimer's disease Mother   . COPD Father   . Emphysema Father   . Hypertension Father   . Healthy Sister   . Healthy Brother   . Alzheimer's disease Maternal Grandmother   . Healthy Sister   . Healthy Sister   . Healthy Sister   . Prostate cancer Other        paternal grandmother's brother; dx in early 66s  . Breast cancer Neg Hx     CURRENT MEDICATIONS:  Current Outpatient Medications  Medication Sig Dispense Refill  . acetylcysteine (MUCOMYST) 20 % nebulizer solution Inhale into the lungs.     Marland Kitchen albuterol (PROVENTIL HFA;VENTOLIN HFA) 108 (90 BASE) MCG/ACT inhaler Inhale 4-6 puffs into the lungs every 6  (six) hours as needed for wheezing or shortness of breath.     Marland Kitchen amLODipine (NORVASC) 5 MG tablet Take 5 mg by mouth at bedtime.     Marland Kitchen aspirin EC 81 MG tablet Take 81 mg by mouth daily.    Marland Kitchen CARBOPLATIN IV Inject into the vein every 21 ( twenty-one) days.    Marland Kitchen docusate sodium (COLACE) 100 MG capsule Take 1 capsule (100 mg total) by mouth 2 (two) times daily as needed for mild constipation. 60 capsule 0  . doxycycline (VIBRAMYCIN) 100 MG capsule Take 1 capsule (100 mg total) by mouth 2 (two) times daily. 20 capsule 0  . ferrous sulfate 325 (65 FE) MG tablet Take 325 mg by mouth 2 (two) times daily with Vicki meal.     . Fluticasone-Umeclidin-Vilant (TRELEGY ELLIPTA) 100-62.5-25 MCG/INH AEPB Inhale into the lungs.    . gabapentin (NEURONTIN) 300 MG capsule Take 300 mg by mouth 3 (three) times daily.     Marland Kitchen guaiFENesin (ROBITUSSIN) 100 MG/5ML liquid Take 100 mg by mouth 2 (two) times daily as needed for cough.    Marland Kitchen ipratropium (ATROVENT HFA) 17 MCG/ACT inhaler Inhale 2 puffs into the lungs every 6 (six) hours as needed for wheezing.     Marland Kitchen ipratropium-albuterol (DUONEB) 0.5-2.5 (3) MG/3ML SOLN Take 0.5 mg by nebulization every 6 (six) hours as needed.     . levalbuterol (XOPENEX) 1.25 MG/3ML nebulizer solution Inhale into the lungs.    . nortriptyline (PAMELOR) 25 MG capsule Take 25 mg by mouth at bedtime.    Marland Kitchen oxyCODONE-acetaminophen (PERCOCET/ROXICET) 5-325 MG tablet Take 1 tablet by mouth every 4 (four) hours as needed for severe pain. 30 tablet 0  . OXYGEN Inhale 3 L into the lungs continuous.     Marland Kitchen PACLITAXEL IV Inject into the vein every 21 ( twenty-one) days.    . pantoprazole (PROTONIX) 40 MG tablet Take 1 tablet (40 mg total) by mouth daily. 30 tablet 2  . predniSONE (DELTASONE) 5 MG tablet Take 5 mg by mouth daily with breakfast.     . simvastatin (ZOCOR) 40 MG tablet Take 40 mg by mouth at bedtime.    . lidocaine-prilocaine (EMLA) cream Apply Vicki small amount to port Vicki cath site and cover with  plastic wrap 1 hour prior to chemotherapy appointments (Patient not taking: Reported on 10/18/2020) 30 g 3  . ondansetron (ZOFRAN ODT) 4 MG disintegrating tablet 62m ODT q4 hours prn nausea/vomit (Patient not taking: Reported on 10/18/2020) 12 tablet 0  . prochlorperazine (COMPAZINE) 10 MG tablet Take 1 tablet (10 mg total) by mouth  every 6 (six) hours as needed (Nausea or vomiting). (Patient not taking: Reported on 10/18/2020) 30 tablet 1   No current facility-administered medications for this visit.    ALLERGIES:  No Known Allergies  PHYSICAL EXAM:  Performance status (ECOG): 1 - Symptomatic but completely ambulatory  Vitals:   10/18/20 0812  BP: (!) 141/70  Pulse: 96  Resp: 18  Temp: (!) 97.2 F (36.2 C)  SpO2: 95%   Wt Readings from Last 3 Encounters:  10/18/20 115 lb 11.2 oz (52.5 kg)  10/16/20 115 lb (52.2 kg)  09/27/20 117 lb 6.4 oz (53.3 kg)   Physical Exam Vitals reviewed.  Constitutional:      Appearance: Normal appearance.  Cardiovascular:     Rate and Rhythm: Normal rate and regular rhythm.     Pulses: Normal pulses.     Heart sounds: Normal heart sounds.  Pulmonary:     Effort: Pulmonary effort is normal.     Breath sounds: Normal breath sounds.  Chest:     Comments: Port-Vicki-Cath in L chest Neurological:     General: No focal deficit present.     Mental Status: She is alert and oriented to person, place, and time.  Psychiatric:        Mood and Affect: Mood normal.        Behavior: Behavior normal.     LABORATORY DATA:  I have reviewed the labs as listed.  CBC Latest Ref Rng & Units 10/18/2020 10/16/2020 09/27/2020  WBC 4.0 - 10.5 K/uL 3.9(L) 9.9 2.4(L)  Hemoglobin 12.0 - 15.0 g/dL 8.3(L) 8.3(L) 7.5(L)  Hematocrit 36 - 46 % 25.5(L) 25.2(L) 23.7(L)  Platelets 150 - 400 K/uL 152 120(L) 162   CMP Latest Ref Rng & Units 10/18/2020 10/16/2020 09/27/2020  Glucose 70 - 99 mg/dL 99 114(H) 102(H)  BUN 8 - 23 mg/dL 12 10 7(L)  Creatinine 0.44 - 1.00 mg/dL 0.54  0.58 0.54  Sodium 135 - 145 mmol/L 137 135 136  Potassium 3.5 - 5.1 mmol/L 3.4(L) 3.5 3.9  Chloride 98 - 111 mmol/L 97(L) 97(L) 100  CO2 22 - 32 mmol/L '31 29 28  ' Calcium 8.9 - 10.3 mg/dL 8.7(L) 8.7(L) 8.6(L)  Total Protein 6.5 - 8.1 g/dL 6.4(L) - 6.3(L)  Total Bilirubin 0.3 - 1.2 mg/dL 0.6 - 0.4  Alkaline Phos 38 - 126 U/L 77 - 86  AST 15 - 41 U/L 16 - 20  ALT 0 - 44 U/L 8 - 11    DIAGNOSTIC IMAGING:  I have independently reviewed the scans and discussed with the patient. DG Chest 2 View  Result Date: 10/16/2020 CLINICAL DATA:  Short of breath, cough for 1 week, history of ovarian cancer with peritoneal carcinomatosis EXAM: CHEST - 2 VIEW COMPARISON:  09/01/2020, 07/08/2020 FINDINGS: Frontal and lateral views of the chest demonstrate Vicki stable cardiac silhouette. Chronic emphysema without superimposed airspace disease, effusion, or pneumothorax. No acute or destructive bony lesions. Stable left chest wall port. IMPRESSION: 1. Stable emphysema and scarring.  No acute airspace disease. Electronically Signed   By: Randa Ngo M.D.   On: 10/16/2020 17:53     ASSESSMENT:  1. Peritoneal carcinomatosis: -Presentation to the ER with right lower quadrant abdominal pain on and off for 1 month. -CTAP on 05/29/2020 showed extensive nodularity throughout the omentum and upper peritoneal cavity. Both ovaries are prominent although no well-defined mass is noted.Small volume ascites. -CA-125 is elevated at 2407. CEA was 2.4. -CT of the chest shows 11 mm right paratracheal lymph  node and 11 mm short axis precarinal lymph node. Subcarinal lymphadenopathy measuring 14 mm short axis. This is suspicious for metastatic disease. 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 -Cycle 1 of carboplatin and paclitaxel on 07/05/2020. -Evaluated by Dr. Denman George on  07/07/2020. Reevaluation after 3 cycles. -Possible surgical resection after 6 cycles. -CT CAP from 09/01/2020 showed mild improvement with reduction in the omental caking. Stable to minimally reduced adenopathy in the chest and abdomen. New 4 mm right upper lobe lung nodule could be inflammatory.  2. COPD: -Quit smoking 6 years ago. Smoked 2 packs/day for 25-30 years. -Has been on3 L/min oxygen via nasal cannulafor the last 5 years.  3. Family history: -Paternal grandmother with colon cancer.   PLAN:  1.High-grade serous ovarian carcinoma: -She has tolerated cycle 5 reasonably well. -Last CA 19-9 was trending down. -Reviewed labs from today which showed white count 3.9 with ANC of 2.5.  Platelet count was normal.  Hemoglobin is 8.3. -She will proceed with her next cycle today. -We will schedule her for CT CAP and follow-up with Dr. Denman George for further recommendations. -Reevaluate in 4 weeks.  2. Family history: -Genetic testing showed BRCA1/2 negative.  3. Peripheral neuropathy: -Continue gabapentin 300 mg 3 times Vicki day.  No worsening since paclitaxel started.  4.  Severe COPD: -Continue prednisone 5 mg daily.  Continue Trelegy and Xopenex.    Orders placed this encounter:  Orders Placed This Encounter  Procedures  . CT CHEST ABDOMEN PELVIS W CONTRAST     Derek Jack, MD [place] [phone]   I, Milinda Antis, am acting as Vicki scribe for Dr. Sanda Linger.  I, Derek Jack MD, have reviewed the above documentation for accuracy and completeness, and I agree with the above.

## 2020-10-18 NOTE — Patient Instructions (Addendum)
[place] at [facility] Discharge Instructions  You were seen today by [name]. He went over your recent results. You received your treatment today. Make sure to finish your doxycycline course. You will be scheduled for a CT scan of your chest and abdomen before your next visit with Dr. Denman George. [name] will see you back in 5 weeks for labs and follow up.   Thank you for choosing [place] at [facility] to provide your oncology and hematology care.  To afford each patient quality time with our provider, please arrive at least 15 minutes before your scheduled appointment time.   If you have a lab appointment with the [place] please come in thru the Main Entrance and check in at the main information desk  You need to re-schedule your appointment should you arrive 10 or more minutes late.  We strive to give you quality time with our providers, and arriving late affects you and other patients whose appointments are after yours.  Also, if you no show three or more times for appointments you may be dismissed from the clinic at the providers discretion.     Again, thank you for choosing [facility].  Our hope is that these requests will decrease the amount of time that you wait before being seen by our physicians.       _____________________________________________________________  Should you have questions after your visit to [facility], please contact our office at [phone] between the hours of 8:00 a.m. and 4:30 p.m.  Voicemails left after 4:00 p.m. will not be returned until the following business day.  For prescription refill requests, have your pharmacy contact our office and allow 72 hours.    Cancer Center Support Programs:   > Cancer Support Group  2nd Tuesday of the month 1pm-2pm, Journey Room

## 2020-10-18 NOTE — Patient Instructions (Signed)
[facility] at [facility] Discharge Instructions  Labs drawn from portacath today   Thank you for choosing [facility] at [facility] to provide your oncology and hematology care.  To afford each patient quality time with our provider, please arrive at least 15 minutes before your scheduled appointment time.   If you have a lab appointment with the Cancer Center please come in thru the Main Entrance and check in at the main information desk.  You need to re-schedule your appointment should you arrive 10 or more minutes late.  We strive to give you quality time with our providers, and arriving late affects you and other patients whose appointments are after yours.  Also, if you no show three or more times for appointments you may be dismissed from the clinic at the providers discretion.     Again, thank you for choosing [facility].  Our hope is that these requests will decrease the amount of time that you wait before being seen by our physicians.       _____________________________________________________________  Should you have questions after your visit to [facility], please contact our office at [phone] and follow the prompts.  Our office hours are 8:00 a.m. and 4:30 p.m. Monday - Friday.  Please note that voicemails left after 4:00 p.m. may not be returned until the following business day.  We are closed weekends and major holidays.  You do have access to a nurse 24-7, just call the main number to the clinic [phone] and do not press any options, hold on the line and a nurse will answer the phone.    For prescription refill requests, have your pharmacy contact our office and allow 72 hours.    Due to Covid, you will need to wear a mask upon entering the hospital. If you do not have a mask, a mask will be given to you at the Main Entrance upon arrival. For doctor visits, patients may have 1 support person age 18  or older with them. For treatment visits, patients can not have anyone with them due to social distancing guidelines and our immunocompromised population.     

## 2020-10-18 NOTE — Progress Notes (Signed)
Patient was assessed by Dr. Katragadda and labs have been reviewed.  Patient is okay to proceed with treatment today. Primary RN and pharmacy aware.   

## 2020-10-19 LAB — CA 125: Cancer Antigen (CA) 125: 52.2 U/mL — ABNORMAL HIGH (ref 0.0–38.1)

## 2020-10-20 ENCOUNTER — Inpatient Hospital Stay (HOSPITAL_COMMUNITY): Payer: Medicare HMO

## 2020-10-20 ENCOUNTER — Other Ambulatory Visit: Payer: Self-pay

## 2020-10-20 VITALS — BP 138/76 | HR 93 | Temp 96.8°F | Resp 18

## 2020-10-20 DIAGNOSIS — C786 Secondary malignant neoplasm of retroperitoneum and peritoneum: Secondary | ICD-10-CM

## 2020-10-20 DIAGNOSIS — Z95828 Presence of other vascular implants and grafts: Secondary | ICD-10-CM

## 2020-10-20 DIAGNOSIS — Z5111 Encounter for antineoplastic chemotherapy: Secondary | ICD-10-CM | POA: Diagnosis not present

## 2020-10-20 MED ORDER — PEGFILGRASTIM-CBQV 6 MG/0.6ML ~~LOC~~ SOSY
6.0000 mg | PREFILLED_SYRINGE | Freq: Once | SUBCUTANEOUS | Status: AC
  Administered 2020-10-20: 6 mg via SUBCUTANEOUS
  Filled 2020-10-20: qty 0.6

## 2020-10-20 NOTE — Progress Notes (Signed)
Patient presented today for Xgeva injection.  Administered to right arm.  Tolerated well with no complaints.  Discharged in stable condition via wheelchair with family member.

## 2020-10-20 NOTE — Progress Notes (Signed)
Nutrition Follow-up:   Patient with ovarian cancer.  Patient receiving neoadjuvant chemotherapy.   Met with patient and boyfriend prior to injection.  Patient reports that appetite has been good, about the same. Went to sisters in [place] for Thanksgiving.  Says that she did not think she ate as much as she would have if she were at home.  Has not been drinking carnation instant breakfast.  Boyfriend made cube steak with gravy, rice and broccoli with cheese last night for dinner and she ate well.      Medications: reviewed  Labs: reviewed  Anthropometrics:   Weight on 12/1 115 lb 11.2 oz decreased from 117 lb 6.4 oz on 11/10  125 lb on 7/21   NUTRITION DIAGNOSIS:  Unintentional weight loss continues   INTERVENTION:  Recommend patient start drinking carnation instant breakfast daily for added calories and protein.     MONITORING, EVALUATION, GOAL: weight trends, intake   NEXT VISIT: Jan 7 after injection  Noelene Gang B. Zenia Resides, [place], [place] Registered Dietitian [phone] (mobile)

## 2020-11-14 ENCOUNTER — Ambulatory Visit (HOSPITAL_COMMUNITY)
Admission: RE | Admit: 2020-11-14 | Discharge: 2020-11-14 | Disposition: A | Discharge status: Home / Self Care | Payer: Medicare HMO | Source: Ambulatory Visit | Attending: Hematology | Admitting: Hematology

## 2020-11-14 ENCOUNTER — Other Ambulatory Visit: Payer: Self-pay

## 2020-11-14 DIAGNOSIS — K5730 Diverticulosis of large intestine without perforation or abscess without bleeding: Secondary | ICD-10-CM | POA: Insufficient documentation

## 2020-11-14 DIAGNOSIS — I700 Atherosclerosis of aorta: Secondary | ICD-10-CM | POA: Diagnosis not present

## 2020-11-14 DIAGNOSIS — I2510 Atherosclerotic heart disease of native coronary artery without angina pectoris: Secondary | ICD-10-CM | POA: Insufficient documentation

## 2020-11-14 DIAGNOSIS — C569 Malignant neoplasm of unspecified ovary: Secondary | ICD-10-CM | POA: Insufficient documentation

## 2020-11-14 MED ORDER — IOHEXOL 300 MG/ML  SOLN
75.0000 mL | Freq: Once | INTRAMUSCULAR | Status: AC | PRN
  Administered 2020-11-14: 11:00:00 75 mL via INTRAVENOUS

## 2020-11-14 MED ORDER — IOHEXOL 300 MG/ML  SOLN: 100.0000 mL | Freq: Once | INTRAMUSCULAR | Status: DC | PRN

## 2020-11-20 ENCOUNTER — Encounter: Payer: Self-pay | Admitting: Gynecologic Oncology

## 2020-11-21 ENCOUNTER — Other Ambulatory Visit: Payer: Self-pay

## 2020-11-21 ENCOUNTER — Inpatient Hospital Stay: Payer: Medicare HMO | Attending: Gynecologic Oncology | Admitting: Gynecologic Oncology

## 2020-11-21 ENCOUNTER — Encounter: Payer: Self-pay | Admitting: Gynecologic Oncology

## 2020-11-21 VITALS — BP 157/77 | HR 96 | Temp 98.2°F | Resp 15 | Ht 60.0 in | Wt 116.6 lb

## 2020-11-21 DIAGNOSIS — Z7952 Long term (current) use of systemic steroids: Secondary | ICD-10-CM | POA: Diagnosis not present

## 2020-11-21 DIAGNOSIS — Z79899 Other long term (current) drug therapy: Secondary | ICD-10-CM | POA: Insufficient documentation

## 2020-11-21 DIAGNOSIS — Z7982 Long term (current) use of aspirin: Secondary | ICD-10-CM | POA: Diagnosis not present

## 2020-11-21 DIAGNOSIS — J449 Chronic obstructive pulmonary disease, unspecified: Secondary | ICD-10-CM | POA: Insufficient documentation

## 2020-11-21 DIAGNOSIS — C786 Secondary malignant neoplasm of retroperitoneum and peritoneum: Secondary | ICD-10-CM | POA: Diagnosis not present

## 2020-11-21 DIAGNOSIS — Z9221 Personal history of antineoplastic chemotherapy: Secondary | ICD-10-CM | POA: Diagnosis not present

## 2020-11-21 DIAGNOSIS — Z87891 Personal history of nicotine dependence: Secondary | ICD-10-CM | POA: Diagnosis not present

## 2020-11-21 DIAGNOSIS — C7800 Secondary malignant neoplasm of unspecified lung: Secondary | ICD-10-CM | POA: Insufficient documentation

## 2020-11-21 DIAGNOSIS — C569 Malignant neoplasm of unspecified ovary: Secondary | ICD-10-CM | POA: Insufficient documentation

## 2020-11-21 DIAGNOSIS — Z9981 Dependence on supplemental oxygen: Secondary | ICD-10-CM | POA: Insufficient documentation

## 2020-11-21 NOTE — Progress Notes (Signed)
Follow-up Note: Gyn-Onc  Consult was requested by [name] for the evaluation of [name] 65 y.o. female  CC:  Chief Complaint  Patient presents with  . Peritoneal carcinomatosis [facility])    Assessment/Plan:  [name]  is a 65 y.o.  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 BRCA negative. Poor surgical candidate due to pulmonary disease. S/p 6 cycles of neoadjuvant chemotherapy with carboplatin and paclitaxel (commenced 07/05/20). Partial response to therapy.  She remains a poor candidate for surgery due to her pulmonary disease. She has not been "cleared" by pulmonology who felt that she was too high a risk for pulmonary decompensation.  Her disease distribution and bulk as seen on most recent imaging suggests that a major laparotomy incision would be necessary (pubis to xiphoid) in order to achieve complete cytoreduction, and this is not a feasible surgery for Vicki Dias given her very poor pulmonary status. It is unlikely she would recover postoperatively to go on to receive the additional chemotherapy she would require for cure. Therefore, as such, intent of therapy is palliative, as we cannot offer potentially curative options.   I am recommending continued carboplatin and paclitaxel systemic therapy with 3 monthly assessments of response. Should she develop complete clinical response (radiographic and CA 125), she could suspend therapy, and transition to surveillance.  Should she progress on therapy, or within 6 months of stopping, she should be considered for a bevacizumab containing doublet therapy (eg bev + weekly taxol, bev + doxil).   HPI: [name] is a 65 year old P2 who was seen in consultation at the request of Dr Delton Coombes for evaluation of stage IV ovarian high grade serous carcinoma.  The patient began experiencing abdominal discomfort with bloating and early satiety in May 2021.  She scheduled a problem visit with her primary care  provider in May 2021 to discuss the symptoms.  This visit was a virtual visit due to Covid precautions.  At that visit her provider prescribed some medication which she thinks was for reflux however this failed to control the symptoms.  In July 2021 pain from the abdomen increased her abdominal pain became so significant that she required an ambulance visit to [facility] on May 29, 2020.  At [facility] a CT scan of the abdomen and pelvis was performed with contrast.  This revealed extensive nodularity throughout the omentum and upper peritoneal cavity highly suspicious for peritoneal carcinomatosis.  Both ovaries were prominent and an ovarian malignancy was suspected.  There were bulky base of mesentery and subhepatic space nodularity including measuring up to 5 cm.  There was scant ascites present.  Retroperitoneal lymph nodes were not enlarged but there were prominent mesenteric lymph nodes.  She was discharged from the emergency department after Ca125 on May 29, 2020 was elevated at 2407 (CEA was normal at 2.4 and CA 19-9 was also normal).  She was scheduled for a CT-guided biopsy of the omentum and this revealed high-grade adenocarcinoma with immunostains positive for p16, PAX eight, WT one, p53, CK7, and patchy positivity for ER.  This morphology and immunophenotype were consistent with a high-grade gynecologic adenocarcinoma including high-grade serous.  She then saw [name] who set her up for port placement, neoadjuvant chemotherapy with carboplatin and paclitaxel and genetics consultation.  CT chest was also ordered and performed on June 27, 2020.  This revealed emphysema and mediastinal lymphadenopathy suspicious for metastatic disease.  Evidence of bronchiectasis was also present.  Her medical history was most significant for emphysema and COPD secondary to chronic tobacco abuse.  She requires home oxygen and has shortness of breath at rest.  She quit smoking in [age]  [age].  Her surgical history was most significant for a tubal ligation, but she had no other prior surgeries on the abdomen.  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  She lives with her boyfriend.   Interval Hx:  After her diagnosis of stage IV primary peritoneal vs ovarian cancer she was recommended to have neoadjuvant chemotherapy due to her poor candidacy for upfront debulking. She saw her pulmonologist, Dr Raul Del, who recommended that surgery under GA would not be safe (from a pulmonary standpoint) and recommended surgery only if it could be achieved via spinal block (to avoid prolonged ventilator placement).  First cycle of neoadjuvant carboplatin paclitaxel was commenced on July 05, 2020. She completed 6 cycles of carboplatin and paclitaxel with day 1 of cycle 6 on 07/05/20. During treatment she had continued modest response with declining CA 125 values (on 10/18/20 had decreased to 52.2), and improvement in measurable disease on CT imaging.  On 11/14/20 CT chest/abdo/pelvis revealed persistent measurable omental caking, upper abdominal lymphadenopathy (>1cm) and peritoneal carcinomatosis overlying the liver.   She tolerated chemotherapy well with no complaints and good performance status.    Current Meds:  Outpatient Encounter Medications as of 11/21/2020  Medication Sig  . acetylcysteine (MUCOMYST) 20 % nebulizer solution Inhale into the lungs.   Marland Kitchen albuterol (PROVENTIL HFA;VENTOLIN HFA) 108 (90 BASE) MCG/ACT inhaler Inhale 4-6 puffs into the lungs every 6 (six) hours as needed for wheezing or shortness of breath.   Marland Kitchen amLODipine (NORVASC) 5 MG tablet Take 5 mg by mouth at bedtime.   Marland Kitchen aspirin EC 81 MG tablet Take 81 mg by mouth daily.  Marland Kitchen CARBOPLATIN IV Inject into the vein every 21 ( twenty-one) days.  . ferrous sulfate 325 (65 FE) MG tablet Take 325 mg by mouth every other day.  .  Fluticasone-Umeclidin-Vilant (TRELEGY ELLIPTA) 100-62.5-25 MCG/INH AEPB Inhale into the lungs.  . gabapentin (NEURONTIN) 300 MG capsule Take 300 mg by mouth 3 (three) times daily.   Marland Kitchen ipratropium (ATROVENT HFA) 17 MCG/ACT inhaler Inhale 2 puffs into the lungs every 6 (six) hours as needed for wheezing.   . nortriptyline (PAMELOR) 25 MG capsule Take 25 mg by mouth at bedtime.  . OXYGEN Inhale 3 L into the lungs continuous.   Marland Kitchen PACLITAXEL IV Inject into the vein every 21 ( twenty-one) days.  . pantoprazole (PROTONIX) 40 MG tablet Take 1 tablet (40 mg total) by mouth daily.  . predniSONE (DELTASONE) 5 MG tablet Take 5 mg by mouth daily with breakfast.   . simvastatin (ZOCOR) 40 MG tablet Take 40 mg by mouth at bedtime.  Marland Kitchen ipratropium-albuterol (DUONEB) 0.5-2.5 (3) MG/3ML SOLN Take 0.5 mg by nebulization every 6 (six) hours as needed.  (Patient not taking: Reported on 11/20/2020)  . levalbuterol (XOPENEX) 1.25 MG/3ML nebulizer solution Inhale into the lungs. (Patient not taking: Reported on 11/20/2020)  . ondansetron (ZOFRAN ODT) 4 MG disintegrating tablet 16m ODT q4 hours prn nausea/vomit (Patient not taking: No sig reported)  . prochlorperazine (COMPAZINE) 10 MG tablet Take 1 tablet (10 mg total) by mouth every 6 (six) hours as needed (Nausea or vomiting). (Patient not taking: No sig reported)  . [DISCONTINUED] docusate sodium (COLACE) 100 MG capsule Take 1 capsule (  100 mg total) by mouth 2 (two) times daily as needed for mild constipation.  . [DISCONTINUED] doxycycline (VIBRAMYCIN) 100 MG capsule Take 1 capsule (100 mg total) by mouth 2 (two) times daily.  . [DISCONTINUED] gabapentin (NEURONTIN) 600 MG tablet Take 600 mg by mouth 3 (three) times daily.  . [DISCONTINUED] guaiFENesin (ROBITUSSIN) 100 MG/5ML liquid Take 100 mg by mouth 2 (two) times daily as needed for cough.  . [DISCONTINUED] lidocaine-prilocaine (EMLA) cream Apply a small amount to port a cath site and cover with plastic wrap 1 hour prior  to chemotherapy appointments (Patient not taking: No sig reported)  . [DISCONTINUED] oxyCODONE-acetaminophen (PERCOCET/ROXICET) 5-325 MG tablet Take 1 tablet by mouth every 4 (four) hours as needed for severe pain.   No facility-administered encounter medications on file as of 11/21/2020.    Allergy: No Known Allergies  Social Hx:   Social History   Socioeconomic History  . Marital status: Divorced    Spouse name: Not on file  . Number of children: 3  . Years of education: Not on file  . Highest education level: Not on file  Occupational History  . Occupation: DISABLED  Tobacco Use  . Smoking status: Former Smoker    Packs/day: 2.00    Years: 30.00    Pack years: 60.00    Types: Cigarettes    Quit date: 11/04/2013    Years since quitting: 7.0  . Smokeless tobacco: Never Used  Vaping Use  . Vaping Use: Never used  Substance and Sexual Activity  . Alcohol use: No  . Drug use: No  . Sexual activity: Not Currently  Other Topics Concern  . Not on file  Social History Narrative  . Not on file   Social Determinants of Health   Financial Resource Strain: Low Risk   . Difficulty of Paying Living Expenses: Not hard at all  Food Insecurity: No Food Insecurity  . Worried About Charity fundraiser in the Last Year: Never true  . Ran Out of Food in the Last Year: Never true  Transportation Needs: No Transportation Needs  . Lack of Transportation (Medical): No  . Lack of Transportation (Non-Medical): No  Physical Activity: Inactive  . Days of Exercise per Week: 0 days  . Minutes of Exercise per Session: 0 min  Stress: No Stress Concern Present  . Feeling of Stress : Not at all  Social Connections: Socially Isolated  . Frequency of Communication with Friends and Family: More than three times a week  . Frequency of Social Gatherings with Friends and Family: Never  . Attends Religious Services: Never  . Active Member of Clubs or Organizations: No  . Attends Archivist  Meetings: Never  . Marital Status: Divorced  Human resources officer Violence: Not At Risk  . Fear of Current or Ex-Partner: No  . Emotionally Abused: No  . Physically Abused: No  . Sexually Abused: No    Past Surgical Hx:  Past Surgical History:  Procedure Laterality Date  . HALLUX VALGUS BASE WEDGE Right 06/09/2015   Procedure: Base wedge osteotomy with modified McBride right foot ;  Surgeon: Sharlotte Alamo, MD;  Location: [facility] ORS;  Service: Podiatry;  Laterality: Right;  . PORTACATH PLACEMENT Left 06/28/2020   Procedure: PORT-A-CATHETER PLACEMENT LEFT CHEST (attached catheter in left subclavian);  Surgeon: Virl Cagey, MD;  Location: [facility];  Service: General;  Laterality: Left;    Past Medical Hx:  Past Medical History:  Diagnosis Date  . Anemia   .  Asthma   . Cancer [facility])    Gastric Cancer  . COPD (chronic obstructive pulmonary disease) ([place])   . High cholesterol   . Hypertension   . Neuropathy   . Pneumonia 2019  . Port-A-Cath in place 07/03/2020    Past Gynecological History:  See HPI No LMP recorded. Patient is postmenopausal.  Family Hx:  Family History  Problem Relation Age of Onset  . Alzheimer's disease Mother   . COPD Father   . Emphysema Father   . Hypertension Father   . Healthy Sister   . Healthy Brother   . Alzheimer's disease Maternal Grandmother   . Healthy Sister   . Healthy Sister   . Healthy Sister   . Prostate cancer Other        paternal grandmother's brother; dx in early 50s  . Breast cancer Neg Hx     Review of Systems:  Constitutional  Feels well,   ENT Normal appearing ears and nares bilaterally Skin/Breast  No rash, sores, jaundice, itching, dryness Cardiovascular  No chest pain Pulmonary  SOB on exertion, on O2 Gastro Intestinal  + abdominal distension, early satiety Genito Urinary  No frequency, urgency, dysuria,  Musculo Skeletal  No myalgia, arthralgia, joint swelling or pain  Neurologic  No weakness, numbness, change  in gait,  Psychology  No depression, anxiety, insomnia.   Vitals:  Blood pressure (!) 157/77, pulse 96, temperature 98.2 F (36.8 C), temperature source Tympanic, resp. rate 15, height 5' (1.524 m), weight 116 lb 9.6 oz (52.9 kg), SpO2 98 %.  Physical Exam: WD in NAD Neck  Supple NROM, without any enlargements.  Lymph Node Survey No cervical supraclavicular or inguinal adenopathy Cardiovascular  Well perfused peripheries Lungs  On O2, at rest, no cough or increased WOB Skin  No rash/lesions/breakdown  Psychiatry  Alert and oriented to person, place, and time  Abdomen  Normoactive bowel sounds, abdomen soft, non-tender and non-obese without evidence of hernia.  Back No CVA tenderness Genito Urinary  Vulva/vagina: Normal external female genitalia.  No lesions. No discharge or bleeding.  Bladder/urethra:  No lesions or masses, well supported bladder  Vagina: normal  Cervix: Normal appearing, no lesions.  Uterus:  Small, mobile, no parametrial involvement or nodularity.  Adnexa: unable to discretely appreciate masses. Rectal  Good tone, no masses no cul de sac nodularity.  Extremities  No bilateral cyanosis, clubbing or edema.  60 minutes of total time was spent for this patient encounter, including preparation, face-to-face counseling with the patient and coordination of care, review of imaging (results and images), communication with the referring provider and documentation of the encounter.   Thereasa Solo, MD  11/21/2020, 3:50 PM

## 2020-11-21 NOTE — Patient Instructions (Signed)
Dr Andrey Farmer is not recommending surgery because this could not be safely accomplished with a spinal block (which is the only safe way to give anesthesia for you). Therefore, she is recommending continuing chemotherapy until there is no more visible disease on your CT scans.

## 2020-11-22 ENCOUNTER — Inpatient Hospital Stay (HOSPITAL_COMMUNITY): Payer: Medicare HMO | Attending: Hematology

## 2020-11-22 ENCOUNTER — Inpatient Hospital Stay (HOSPITAL_BASED_OUTPATIENT_CLINIC_OR_DEPARTMENT_OTHER): Payer: Medicare HMO | Admitting: Hematology

## 2020-11-22 ENCOUNTER — Inpatient Hospital Stay (HOSPITAL_COMMUNITY): Payer: Medicare HMO

## 2020-11-22 VITALS — BP 134/69 | HR 92 | Temp 96.8°F | Resp 16 | Wt 117.7 lb

## 2020-11-22 VITALS — BP 113/63 | HR 84 | Temp 98.0°F | Resp 18

## 2020-11-22 DIAGNOSIS — Z87891 Personal history of nicotine dependence: Secondary | ICD-10-CM | POA: Diagnosis not present

## 2020-11-22 DIAGNOSIS — C569 Malignant neoplasm of unspecified ovary: Secondary | ICD-10-CM | POA: Diagnosis present

## 2020-11-22 DIAGNOSIS — Z79899 Other long term (current) drug therapy: Secondary | ICD-10-CM | POA: Diagnosis not present

## 2020-11-22 DIAGNOSIS — G629 Polyneuropathy, unspecified: Secondary | ICD-10-CM | POA: Diagnosis not present

## 2020-11-22 DIAGNOSIS — Z95828 Presence of other vascular implants and grafts: Secondary | ICD-10-CM

## 2020-11-22 DIAGNOSIS — J449 Chronic obstructive pulmonary disease, unspecified: Secondary | ICD-10-CM | POA: Diagnosis not present

## 2020-11-22 DIAGNOSIS — C786 Secondary malignant neoplasm of retroperitoneum and peritoneum: Secondary | ICD-10-CM | POA: Diagnosis present

## 2020-11-22 DIAGNOSIS — Z7982 Long term (current) use of aspirin: Secondary | ICD-10-CM | POA: Insufficient documentation

## 2020-11-22 DIAGNOSIS — I10 Essential (primary) hypertension: Secondary | ICD-10-CM | POA: Diagnosis not present

## 2020-11-22 DIAGNOSIS — Z800 Family history of malignant neoplasm of digestive organs: Secondary | ICD-10-CM | POA: Insufficient documentation

## 2020-11-22 DIAGNOSIS — Z5111 Encounter for antineoplastic chemotherapy: Secondary | ICD-10-CM | POA: Diagnosis not present

## 2020-11-22 DIAGNOSIS — Z8042 Family history of malignant neoplasm of prostate: Secondary | ICD-10-CM | POA: Diagnosis not present

## 2020-11-22 DIAGNOSIS — E7800 Pure hypercholesterolemia, unspecified: Secondary | ICD-10-CM | POA: Diagnosis not present

## 2020-11-22 DIAGNOSIS — Z8249 Family history of ischemic heart disease and other diseases of the circulatory system: Secondary | ICD-10-CM | POA: Diagnosis not present

## 2020-11-22 DIAGNOSIS — Z5189 Encounter for other specified aftercare: Secondary | ICD-10-CM | POA: Insufficient documentation

## 2020-11-22 DIAGNOSIS — Z85028 Personal history of other malignant neoplasm of stomach: Secondary | ICD-10-CM | POA: Diagnosis not present

## 2020-11-22 LAB — COMPREHENSIVE METABOLIC PANEL
ALT: 9 U/L (ref 0–44)
AST: 20 U/L (ref 15–41)
Albumin: 3.8 g/dL (ref 3.5–5.0)
Alkaline Phosphatase: 66 U/L (ref 38–126)
Anion gap: 10 (ref 5–15)
BUN: 13 mg/dL (ref 8–23)
CO2: 30 mmol/L (ref 22–32)
Calcium: 8.9 mg/dL (ref 8.9–10.3)
Chloride: 97 mmol/L — ABNORMAL LOW (ref 98–111)
Creatinine, Ser: 0.49 mg/dL (ref 0.44–1.00)
GFR, Estimated: >60 mL/min (ref >60)
Glucose, Bld: 94 mg/dL (ref 70–99)
Potassium: 3.9 mmol/L (ref 3.5–5.1)
Sodium: 137 mmol/L (ref 135–145)
Total Bilirubin: 0.5 mg/dL (ref 0.3–1.2)
Total Protein: 6.6 g/dL (ref 6.5–8.1)

## 2020-11-22 LAB — CBC WITH DIFFERENTIAL/PLATELET
Abs Immature Granulocytes: 0.02 10*3/uL (ref 0.00–0.07)
Basophils Absolute: 0.1 10*3/uL (ref 0.0–0.1)
Basophils Relative: 1 %
Eosinophils Absolute: 0.1 10*3/uL (ref 0.0–0.5)
Eosinophils Relative: 2 %
HCT: 32.8 % — ABNORMAL LOW (ref 36.0–46.0)
Hemoglobin: 10.5 g/dL — ABNORMAL LOW (ref 12.0–15.0)
Immature Granulocytes: 0 %
Lymphocytes Relative: 17 %
Lymphs Abs: 0.9 10*3/uL (ref 0.7–4.0)
MCH: 33.5 pg (ref 26.0–34.0)
MCHC: 32 g/dL (ref 30.0–36.0)
MCV: 104.8 fL — ABNORMAL HIGH (ref 80.0–100.0)
Monocytes Absolute: 0.6 10*3/uL (ref 0.1–1.0)
Monocytes Relative: 11 %
Neutro Abs: 3.8 10*3/uL (ref 1.7–7.7)
Neutrophils Relative %: 69 %
Platelets: 285 10*3/uL (ref 150–400)
RBC: 3.13 MIL/uL — ABNORMAL LOW (ref 3.87–5.11)
RDW: 16 % — ABNORMAL HIGH (ref 11.5–15.5)
WBC: 5.6 10*3/uL (ref 4.0–10.5)
nRBC: 0 % (ref 0.0–0.2)

## 2020-11-22 MED ORDER — SODIUM CHLORIDE 0.9% FLUSH
10.0000 mL | INTRAVENOUS | Status: DC | PRN
  Administered 2020-11-22 (×2): 10 mL

## 2020-11-22 MED ORDER — SODIUM CHLORIDE 0.9 % IV SOLN
538.8000 mg | Freq: Once | INTRAVENOUS | Status: AC
  Administered 2020-11-22: 540 mg via INTRAVENOUS
  Filled 2020-11-22: qty 54

## 2020-11-22 MED ORDER — SODIUM CHLORIDE 0.9 % IV SOLN
150.0000 mg | Freq: Once | INTRAVENOUS | Status: AC
  Administered 2020-11-22: 150 mg via INTRAVENOUS
  Filled 2020-11-22: qty 150

## 2020-11-22 MED ORDER — SODIUM CHLORIDE 0.9 % IV SOLN
140.0000 mg/m2 | Freq: Once | INTRAVENOUS | Status: AC
  Administered 2020-11-22: 216 mg via INTRAVENOUS
  Filled 2020-11-22: qty 36

## 2020-11-22 MED ORDER — DIPHENHYDRAMINE HCL 50 MG/ML IJ SOLN
50.0000 mg | Freq: Once | INTRAMUSCULAR | Status: AC
  Administered 2020-11-22: 50 mg via INTRAVENOUS
  Filled 2020-11-22: qty 1

## 2020-11-22 MED ORDER — HEPARIN SOD (PORK) LOCK FLUSH 100 UNIT/ML IV SOLN
500.0000 [IU] | Freq: Once | INTRAVENOUS | Status: AC | PRN
  Administered 2020-11-22: 500 [IU]

## 2020-11-22 MED ORDER — PALONOSETRON HCL INJECTION 0.25 MG/5ML
0.2500 mg | Freq: Once | INTRAVENOUS | Status: AC
  Administered 2020-11-22: 0.25 mg via INTRAVENOUS
  Filled 2020-11-22: qty 5

## 2020-11-22 MED ORDER — FAMOTIDINE IN NACL 20-0.9 MG/50ML-% IV SOLN
20.0000 mg | Freq: Once | INTRAVENOUS | Status: AC
  Administered 2020-11-22: 20 mg via INTRAVENOUS
  Filled 2020-11-22: qty 50

## 2020-11-22 MED ORDER — SODIUM CHLORIDE 0.9 % IV SOLN
10.0000 mg | Freq: Once | INTRAVENOUS | Status: AC
  Administered 2020-11-22: 10 mg via INTRAVENOUS
  Filled 2020-11-22: qty 10

## 2020-11-22 MED ORDER — SODIUM CHLORIDE 0.9 % IV SOLN: Freq: Once | INTRAVENOUS | Status: AC

## 2020-11-22 NOTE — Progress Notes (Signed)
[facility] [address] [place], [place] [address]   CLINIC:  Medical Oncology/Hematology  PCP:  Vicki Sander, MD [address] / [place] [address] [phone]   REASON FOR VISIT:  Follow-up for high-grade serous ovarian carcinoma  PRIOR THERAPY: None  NGS Results: BRCA 1/2 negative  CURRENT THERAPY: Carboplatin, paclitaxel & Aloxi every 3 weeks  BRIEF ONCOLOGIC HISTORY:  Oncology History  Peritoneal carcinomatosis (HCC)  06/06/2020 Initial Diagnosis   Peritoneal carcinomatosis (HCC)   07/05/2020 -  Chemotherapy   The patient had palonosetron (ALOXI) injection 0.25 mg, 0.25 mg, Intravenous,  Once, 6 of 7 cycles Administration: 0.25 mg (07/05/2020), 0.25 mg (07/26/2020), 0.25 mg (08/16/2020), 0.25 mg (09/06/2020), 0.25 mg (09/27/2020), 0.25 mg (10/18/2020) pegfilgrastim-cbqv (UDENYCA) injection 6 mg, 6 mg, Subcutaneous, Once, 6 of 7 cycles Administration: 6 mg (07/07/2020), 6 mg (07/28/2020), 6 mg (08/18/2020), 6 mg (09/08/2020), 6 mg (09/29/2020) CARBOplatin (PARAPLATIN) 540 mg in sodium chloride 0.9 % 250 mL chemo infusion, 540 mg (100 % of original dose 543.6 mg), Intravenous,  Once, 6 of 7 cycles Dose modification:   (original dose 543.6 mg, Cycle 1), 540 mg (original dose 540 mg, Cycle 2),   (original dose 538.8 mg, Cycle 3),   (original dose 538.8 mg, Cycle 4),   (original dose 538.8 mg, Cycle 5),   (original dose 538.8 mg, Cycle 6) Administration: 540 mg (07/05/2020), 540 mg (07/26/2020), 540 mg (08/16/2020), 540 mg (09/06/2020), 540 mg (09/27/2020), 540 mg (10/18/2020) fosaprepitant (EMEND) 150 mg in sodium chloride 0.9 % 145 mL IVPB, 150 mg, Intravenous,  Once, 6 of 7 cycles Administration: 150 mg (07/05/2020), 150 mg (07/26/2020), 150 mg (08/16/2020), 150 mg (09/06/2020), 150 mg (09/27/2020), 150 mg (10/18/2020) PACLitaxel (TAXOL) 216 mg in sodium chloride 0.9 % 250 mL chemo infusion (> 80mg /m2), 140 mg/m2 = 216 mg (80 % of original dose 175 mg/m2), Intravenous,   Once, 6 of 7 cycles Dose modification: 140 mg/m2 (80 % of original dose 175 mg/m2, Cycle 1, Reason: Other (see comments), Comment: neuropathy) Administration: 216 mg (07/05/2020), 216 mg (07/26/2020), 216 mg (08/16/2020), 216 mg (09/06/2020), 216 mg (09/27/2020), 216 mg (10/18/2020)  for chemotherapy treatment.    07/22/2020 Genetic Testing   Negative genetic testing: no pathogenic variants detected in [facility] Expanded CancerNext Panel + RNAInsight. The CancerNext-Expanded gene panel offered by [facility] and includes sequencing and rearrangement analysis for the following 77 genes: AIP, ALK, APC*, ATM*, AXIN2, BAP1, BARD1, BLM, BMPR1A, BRCA1*, BRCA2*, BRIP1*, CDC73, CDH1*, CDK4, CDKN1B, CDKN2A, CHEK2*, CTNNA1, DICER1, FANCC, FH, FLCN, GALNT12, KIF1B, LZTR1, MAX, MEN1, MET, MLH1*, MSH2*, MSH3, MSH6*, MUTYH*, NBN, NF1*, NF2, NTHL1, PALB2*, PHOX2B, PMS2*, POT1, PRKAR1A, PTCH1, PTEN*, RAD51C*, RAD51D*, RB1, RECQL, RET, SDHA, SDHAF2, SDHB, SDHC, SDHD, SMAD4, SMARCA4, SMARCB1, SMARCE1, STK11, SUFU, TMEM127, TP53*, TSC1, TSC2, VHL and XRCC2 (sequencing and deletion/duplication); EGFR, EGLN1, HOXB13, KIT, MITF, PDGFRA, POLD1, and POLE (sequencing only); EPCAM and GREM1 (deletion/duplication only). DNA and RNA analyses performed for * genes. The report date is July 22, 2020.      CANCER STAGING: Cancer Staging No matching staging information was found for the patient.  INTERVAL HISTORY:  [name], a 65 y.o. female, returns for routine follow-up and consideration for next cycle of chemotherapy. [name] was last seen on 10/18/2020.  Due for cycle #7 of carboplatin, paclitaxel and Aloxi today.   Today she is accompanied by her husband. Overall, she tells me she has been feeling sad since her surgery was denied. She tolerated  the previous treatment well and her neuropathy is stable with gabapentin. Her appetite is excellent and she denies having abdominal pain or leg pains. Her SOB is stable and she  continues using her nasal cannula.  Overall, she feels ready for next cycle of chemo today.    REVIEW OF SYSTEMS:  Review of Systems  Constitutional: Positive for fatigue (50%). Negative for appetite change.  Respiratory: Positive for shortness of breath (on [place]).   Gastrointestinal: Negative for abdominal pain.  Musculoskeletal: Negative for arthralgias and myalgias.  Neurological: Positive for numbness (stable on gabapentin).  All other systems reviewed and are negative.   PAST MEDICAL/SURGICAL HISTORY:  Past Medical History:  Diagnosis Date  . Anemia   . Asthma   . Cancer [facility])    Gastric Cancer  . COPD (chronic obstructive pulmonary disease) (HCC)   . High cholesterol   . Hypertension   . Neuropathy   . Pneumonia 2019  . Port-A-Cath in place 07/03/2020   Past Surgical History:  Procedure Laterality Date  . HALLUX VALGUS BASE WEDGE Right 06/09/2015   Procedure: Base wedge osteotomy with modified McBride right foot ;  Surgeon: Linus Galas, MD;  Location: [facility] ORS;  Service: Podiatry;  Laterality: Right;  . PORTACATH PLACEMENT Left 06/28/2020   Procedure: PORT-A-CATHETER PLACEMENT LEFT CHEST (attached catheter in left subclavian);  Surgeon: Lucretia Roers, MD;  Location: [facility] ORS;  Service: General;  Laterality: Left;    SOCIAL HISTORY:  Social History   Socioeconomic History  . Marital status: Divorced    Spouse name: Not on file  . Number of children: 3  . Years of education: Not on file  . Highest education level: Not on file  Occupational History  . Occupation: DISABLED  Tobacco Use  . Smoking status: Former Smoker    Packs/day: 2.00    Years: 30.00    Pack years: 60.00    Types: Cigarettes    Quit date: 11/04/2013    Years since quitting: 7.0  . Smokeless tobacco: Never Used  Vaping Use  . Vaping Use: Never used  Substance and Sexual Activity  . Alcohol use: No  . Drug use: No  . Sexual activity: Not Currently  Other Topics Concern  . Not on file   Social History Narrative  . Not on file   Social Determinants of Health   Financial Resource Strain: Low Risk   . Difficulty of Paying Living Expenses: Not hard at all  Food Insecurity: No Food Insecurity  . Worried About Programme researcher, broadcasting/film/video in the Last Year: Never true  . Ran Out of Food in the Last Year: Never true  Transportation Needs: No Transportation Needs  . Lack of Transportation (Medical): No  . Lack of Transportation (Non-Medical): No  Physical Activity: Inactive  . Days of Exercise per Week: 0 days  . Minutes of Exercise per Session: 0 min  Stress: No Stress Concern Present  . Feeling of Stress : Not at all  Social Connections: Socially Isolated  . Frequency of Communication with Friends and Family: More than three times a week  . Frequency of Social Gatherings with Friends and Family: Never  . Attends Religious Services: Never  . Active Member of Clubs or Organizations: No  . Attends Banker Meetings: Never  . Marital Status: Divorced  Catering manager Violence: Not At Risk  . Fear of Current or Ex-Partner: No  . Emotionally Abused: No  . Physically Abused: No  . Sexually Abused: No  FAMILY HISTORY:  Family History  Problem Relation Age of Onset  . Alzheimer's disease Mother   . COPD Father   . Emphysema Father   . Hypertension Father   . Healthy Sister   . Healthy Brother   . Alzheimer's disease Maternal Grandmother   . Healthy Sister   . Healthy Sister   . Healthy Sister   . Prostate cancer Other        paternal grandmother's brother; dx in early 57s  . Breast cancer Neg Hx     CURRENT MEDICATIONS:  Current Outpatient Medications  Medication Sig Dispense Refill  . acetylcysteine (MUCOMYST) 20 % nebulizer solution Inhale into the lungs.     Marland Kitchen albuterol (PROVENTIL HFA;VENTOLIN HFA) 108 (90 BASE) MCG/ACT inhaler Inhale 4-6 puffs into the lungs every 6 (six) hours as needed for wheezing or shortness of breath.     Marland Kitchen amLODipine  (NORVASC) 5 MG tablet Take 5 mg by mouth at bedtime.     Marland Kitchen aspirin EC 81 MG tablet Take 81 mg by mouth daily.    Marland Kitchen CARBOPLATIN IV Inject into the vein every 21 ( twenty-one) days.    . ferrous sulfate 325 (65 FE) MG tablet Take 325 mg by mouth every other day.    . Fluticasone-Umeclidin-Vilant (TRELEGY ELLIPTA) 100-62.5-25 MCG/INH AEPB Inhale into the lungs.    . gabapentin (NEURONTIN) 300 MG capsule Take 300 mg by mouth 3 (three) times daily.     Marland Kitchen ipratropium (ATROVENT HFA) 17 MCG/ACT inhaler Inhale 2 puffs into the lungs every 6 (six) hours as needed for wheezing.     Marland Kitchen ipratropium-albuterol (DUONEB) 0.5-2.5 (3) MG/3ML SOLN Take 0.5 mg by nebulization every 6 (six) hours as needed.    . levalbuterol (XOPENEX) 1.25 MG/3ML nebulizer solution Inhale into the lungs.    . nortriptyline (PAMELOR) 25 MG capsule Take 25 mg by mouth at bedtime.    . ondansetron (ZOFRAN ODT) 4 MG disintegrating tablet 4mg  ODT q4 hours prn nausea/vomit 12 tablet 0  . OXYGEN Inhale 3 L into the lungs continuous.     Marland Kitchen PACLITAXEL IV Inject into the vein every 21 ( twenty-one) days.    . pantoprazole (PROTONIX) 40 MG tablet Take 1 tablet (40 mg total) by mouth daily. 30 tablet 2  . predniSONE (DELTASONE) 5 MG tablet Take 5 mg by mouth daily with breakfast.     . prochlorperazine (COMPAZINE) 10 MG tablet Take 1 tablet (10 mg total) by mouth every 6 (six) hours as needed (Nausea or vomiting). 30 tablet 1  . simvastatin (ZOCOR) 40 MG tablet Take 40 mg by mouth at bedtime.     No current facility-administered medications for this visit.    ALLERGIES:  No Known Allergies  PHYSICAL EXAM:  Performance status (ECOG): 1 - Symptomatic but completely ambulatory  Vitals:   11/22/20 0905  BP: 134/69  Pulse: 92  Resp: 16  Temp: (!) 96.8 F (36 C)  SpO2: 92%   Wt Readings from Last 3 Encounters:  11/22/20 117 lb 11.6 oz (53.4 kg)  11/21/20 116 lb 9.6 oz (52.9 kg)  10/18/20 115 lb 11.2 oz (52.5 kg)   Physical  Exam Vitals reviewed.  Constitutional:      Appearance: Normal appearance.     Interventions: Nasal cannula in place.  Cardiovascular:     Rate and Rhythm: Normal rate and regular rhythm.     Pulses: Normal pulses.     Heart sounds: Normal heart sounds.  Pulmonary:  Effort: Pulmonary effort is normal.     Breath sounds: Normal breath sounds.  Chest:     Comments: Port-a-Cath in L chest Abdominal:     Palpations: Abdomen is soft. There is no mass.     Tenderness: There is no abdominal tenderness.  Musculoskeletal:     Right lower leg: No edema.     Left lower leg: No edema.  Neurological:     General: No focal deficit present.     Mental Status: She is alert and oriented to person, place, and time.  Psychiatric:        Mood and Affect: Mood normal.        Behavior: Behavior normal.     LABORATORY DATA:  I have reviewed the labs as listed.  CBC Latest Ref Rng & Units 11/22/2020 10/18/2020 10/16/2020  WBC 4.0 - 10.5 K/uL 5.6 3.9(L) 9.9  Hemoglobin 12.0 - 15.0 g/dL 10.5(L) 8.3(L) 8.3(L)  Hematocrit 36.0 - 46.0 % 32.8(L) 25.5(L) 25.2(L)  Platelets 150 - 400 K/uL 285 152 120(L)   CMP Latest Ref Rng & Units 10/18/2020 10/16/2020 09/27/2020  Glucose 70 - 99 mg/dL 99 114(H) 102(H)  BUN 8 - 23 mg/dL 12 10 7(L)  Creatinine 0.44 - 1.00 mg/dL 0.54 0.58 0.54  Sodium 135 - 145 mmol/L 137 135 136  Potassium 3.5 - 5.1 mmol/L 3.4(L) 3.5 3.9  Chloride 98 - 111 mmol/L 97(L) 97(L) 100  CO2 22 - 32 mmol/L $RemoveB'31 29 28  'QiHbxtWB$ Calcium 8.9 - 10.3 mg/dL 8.7(L) 8.7(L) 8.6(L)  Total Protein 6.5 - 8.1 g/dL 6.4(L) - 6.3(L)  Total Bilirubin 0.3 - 1.2 mg/dL 0.6 - 0.4  Alkaline Phos 38 - 126 U/L 77 - 86  AST 15 - 41 U/L 16 - 20  ALT 0 - 44 U/L 8 - 11    DIAGNOSTIC IMAGING:  I have independently reviewed the scans and discussed with the patient. CT CHEST ABDOMEN PELVIS W CONTRAST  Result Date: 11/14/2020 CLINICAL DATA:  Ovarian cancer with peritoneal carcinomatosis, follow-up EXAM: CT CHEST, ABDOMEN,  AND PELVIS WITH CONTRAST TECHNIQUE: Multidetector CT imaging of the chest, abdomen and pelvis was performed following the standard protocol during bolus administration of intravenous contrast. CONTRAST:  53mL OMNIPAQUE IOHEXOL 300 MG/ML  SOLN COMPARISON:  09/01/2020 FINDINGS: CT CHEST FINDINGS Cardiovascular: Heart is normal in size.  No pericardial effusion. No evidence of thoracic aortic aneurysm. Atherosclerotic calcifications of the aortic arch. Mild coronary atherosclerosis of the LAD and left circumflex. Left chest port terminates at the cavoatrial junction. Mediastinum/Nodes: Small mediastinal lymph nodes, including an 8 mm short axis low right paratracheal node and an 8 mm short axis right azygoesophageal recess node, grossly unchanged. Visualized thyroid is unremarkable. Lungs/Pleura: Moderate centrilobular and paraseptal emphysematous changes, upper lung predominant. Prior 4 mm right upper lobe nodule is not well visualized on the current study. Bilateral lower lobe bronchiectasis with bronchial wall thickening in subpleural peribronchovascular nodularity, likely infectious inflammatory, possibly related to infectious bronchiolitis. Scarring in the right upper lobe (series 4/image 33). Additional mild lingular scarring. No focal consolidation. No pleural effusion or pneumothorax. Musculoskeletal: Visualized osseous structures are within normal limits. CT ABDOMEN PELVIS FINDINGS Hepatobiliary: Liver is within normal limits. Mild peritoneal nodularity along the gallbladder measuring 8 x 14 mm (series 2/image 69), previously 1.9 x 3.0 cm. Gallbladder is otherwise within normal limits. No intrahepatic or extrahepatic ductal dilatation. Pancreas: Within normal limits. Spleen: Within normal limits. Adrenals/Urinary Tract: Adrenal glands are within normal limits. Kidneys are within normal limits.  No hydronephrosis. Low lying bladder, within normal limits. Stomach/Bowel: Stomach is within normal limits. No  evidence of bowel obstruction. Normal appendix (series 2/image 95). Sigmoid diverticulosis, without evidence of diverticulitis. Vascular/Lymphatic: No evidence of abdominal aortic aneurysm. Atherosclerotic calcifications of the abdominal aorta and branch vessels. 9 mm short axis portacaval node (series 2/image 57), previously 11 mm. Adjacent upper abdominal nodes measuring up to 12 mm short axis are less conspicuous on the current study. Small retroperitoneal nodes measuring up to 5 mm short axis, improved. Small jejunal mesenteric nodes measuring up to 6 mm short axis (series 2/image 82), previously 10 mm. Reproductive: Heterogeneous uterus, reflecting uterine fibroids. No adnexal masses. Other: No abdominopelvic ascites. Associated peritoneal disease/omental caking, now measuring to 2.4 cm in thickness beneath the anterior abdominal wall (series 2/image 76), previously 3.1 cm. Musculoskeletal: Visualized osseous structures are within normal limits. IMPRESSION: Improving peritoneal disease/omental caking, as above. Small abdominopelvic lymph nodes, improved. No evidence of metastatic disease in the chest. Additional ancillary findings as above. Electronically Signed   By: Julian Hy M.D.   On: 11/14/2020 13:58     ASSESSMENT:  1. Peritoneal carcinomatosis: -Presentation to the ER with right lower quadrant abdominal pain on and off for 1 month. -CTAP on 05/29/2020 showed extensive nodularity throughout the omentum and upper peritoneal cavity. Both ovaries are prominent although no well-defined mass is noted.Small volume ascites. -CA-125 is elevated at 2407. CEA was 2.4. -CT of the chest shows 11 mm right paratracheal lymph node and 11 mm short axis precarinal lymph node. Subcarinal lymphadenopathy measuring 14 mm short axis. This is suspicious for metastatic disease. -Needle biopsy of the omentum on 06/13/2020 shows high-grade adenocarcinoma, morphology and IHC consistent with high-grade  gynecological adenocarcinoma including high-grade ovarian serous carcinoma. -Cycle 1 of carboplatin and paclitaxel on 07/05/2020. -Evaluated by Dr. Denman George on 07/07/2020. Reevaluation after 3 cycles. -Possible surgical resection after 6 cycles. -CT CAP from 09/01/2020 showed mild improvement with reduction in the omental caking. Stable to minimally reduced adenopathy in the chest and abdomen. New 4 mm right upper lobe lung nodule could be inflammatory. -CT CAP on 11/07/2019 and after 6 cycles showed improved peritoneal disease/omental caking.  9 mm short axis portacaval lymph node and adjacent upper abdominal lymph nodes measuring 12 mm in short axis.  Small retroperitoneal lymph nodes up to 5 mm improved.  Peritoneal disease/omental caking now measures 2.4 cm in thickness. -CA-125 on 10/18/2020 improved 52.  2. COPD: -Quit smoking 6 years ago. Smoked 2 packs/day for 25-30 years. -Has been on3 L/min oxygen via nasal cannulafor the last 5 years.  3. Family history: -Paternal grandmother with colon cancer.   PLAN:  1.High-grade serous ovarian carcinoma: -I have discussed results of the CT CAP.  I have also discussed results of CA-125. -She was evaluated by Dr. Denman George for surgical resection. -Unfortunately she was not cleared by pulmonary/anesthesiology given her COPD. -Hence she was recommended to continue with chemotherapy at this time. -Reviewed labs today which showed hemoglobin improved to 10.5.  LFTs are normal. -Proceed with carboplatin and paclitaxel.  Paclitaxel dose reduced. -RTC 3 weeks for follow-up.  2. Family history: -Germline mutation testing for BRCA1/2 is negative.  3. Peripheral neuropathy: -Continue gabapentin 300 mg 3 times a day.  4.  Severe COPD: -Continue prednisone 5 mg daily and Trelegy and Xopenex.   Orders placed this encounter:  No orders of the defined types were placed in this encounter.    Derek Jack, MD [place] [phone]  I, Daniel Khashchuk, am acting as a scribe for Dr. Mateya Torti Katagadda.  I, Yacine Garriga MD, have reviewed the above documentation for accuracy and completeness, and I agree with the above.     

## 2020-11-22 NOTE — Progress Notes (Signed)
1045 Labs reviewed with and pt seen by Dr. Ellin Saba and pt approved for Taxol and Carboplatin infusions today per MD          [name] tolerated chemo tx well without complaints or incident. VSS upon discharge. Pt discharged via wheelchair in satisfactory condition accompanied by family member

## 2020-11-22 NOTE — Progress Notes (Signed)
Patient assessed and labs reviewed by Dr. Katragadda. Okay to proceed with treatment. Primary RN and pharmacy aware. 

## 2020-11-22 NOTE — Patient Instructions (Signed)
[facility] at [facility] Discharge Instructions  You were seen today by Dr. Katragadda. He went over your recent results. You received your treatment today. Dr. Katragadda will see you back in 3 weeks for labs and follow up.   Thank you for choosing [facility] at [facility] to provide your oncology and hematology care.  To afford each patient quality time with our provider, please arrive at least 15 minutes before your scheduled appointment time.   If you have a lab appointment with the Cancer Center please come in thru the Main Entrance and check in at the main information desk  You need to re-schedule your appointment should you arrive 10 or more minutes late.  We strive to give you quality time with our providers, and arriving late affects you and other patients whose appointments are after yours.  Also, if you no show three or more times for appointments you may be dismissed from the clinic at the providers discretion.     Again, thank you for choosing [facility].  Our hope is that these requests will decrease the amount of time that you wait before being seen by our physicians.       _____________________________________________________________  Should you have questions after your visit to  Cancer Center, please contact our office at [phone] between the hours of 8:00 a.m. and 4:30 p.m.  Voicemails left after 4:00 p.m. will not be returned until the following business day.  For prescription refill requests, have your pharmacy contact our office and allow 72 hours.    Cancer Center Support Programs:   > Cancer Support Group  2nd Tuesday of the month 1pm-2pm, Journey Room    

## 2020-11-22 NOTE — Patient Instructions (Signed)
[facility] Discharge Instructions for Patients Receiving Chemotherapy   Beginning January 23rd 2017 lab work for the Cancer Center will be done in the  Main lab at [place] on 1st floor. If you have a lab appointment with the Cancer Center please come in thru the  Main Entrance and check in at the main information desk   Today you received the following chemotherapy agents Taxol and Carboplatin. Follow-up as scheduled  To help prevent nausea and vomiting after your treatment, we encourage you to take your nausea medication   If you develop nausea and vomiting, or diarrhea that is not controlled by your medication, call the clinic.  The clinic phone number is [phone]. Office hours are Monday-Friday 8:30am-5:00pm.  BELOW ARE SYMPTOMS THAT SHOULD BE REPORTED IMMEDIATELY:  *FEVER GREATER THAN 101.0 F  *CHILLS WITH OR WITHOUT FEVER  NAUSEA AND VOMITING THAT IS NOT CONTROLLED WITH YOUR NAUSEA MEDICATION  *UNUSUAL SHORTNESS OF BREATH  *UNUSUAL BRUISING OR BLEEDING  TENDERNESS IN MOUTH AND THROAT WITH OR WITHOUT PRESENCE OF ULCERS  *URINARY PROBLEMS  *BOWEL PROBLEMS  UNUSUAL RASH Items with * indicate a potential emergency and should be followed up as soon as possible. If you have an emergency after office hours please contact your primary care physician or go to the nearest emergency department.  Please call the clinic during office hours if you have any questions or concerns.   You may also contact the Patient Navigator at [phone] should you have any questions or need assistance in obtaining follow up care.      Resources For Cancer Patients and their Caregivers ? American Cancer Society: Can assist with transportation, wigs, general needs, runs Look Good Feel Better.        1-888-227-6333 ? Cancer Care: Provides financial assistance, online support groups, medication/co-pay assistance.  1-800-813-HOPE (4673) ? [facility] Assists [place] cancer patients and their families through emotional , educational and financial support.  336-427-4357 ? [place] DSS Where to apply for food stamps, Medicaid and utility assistance. 336-342-1394 ? [facility]: Transportation to medical appointments. 336-347-2287 ? Social Security Administration: May apply for disability if have a Stage IV cancer. 336-342-7796 1-800-772-1213 ? [place] Aging, Disability and Transit Services: Assists with nutrition, care and transit needs. 336-349-2343         

## 2020-11-22 NOTE — Progress Notes (Signed)
Patients port flushed without difficulty.  Good blood return noted with no bruising or swelling noted at site.  Transparent dressing applied.  Patient remains accessed for chemotherapy treatment.  

## 2020-11-23 LAB — CA 125: Cancer Antigen (CA) 125: 42.2 U/mL — ABNORMAL HIGH (ref 0.0–38.1)

## 2020-11-24 ENCOUNTER — Inpatient Hospital Stay (HOSPITAL_COMMUNITY): Payer: Medicare HMO

## 2020-11-24 ENCOUNTER — Other Ambulatory Visit: Payer: Self-pay

## 2020-11-24 VITALS — BP 118/59 | HR 95 | Temp 97.7°F | Resp 16

## 2020-11-24 DIAGNOSIS — Z5111 Encounter for antineoplastic chemotherapy: Secondary | ICD-10-CM | POA: Diagnosis not present

## 2020-11-24 DIAGNOSIS — C786 Secondary malignant neoplasm of retroperitoneum and peritoneum: Secondary | ICD-10-CM

## 2020-11-24 DIAGNOSIS — Z95828 Presence of other vascular implants and grafts: Secondary | ICD-10-CM

## 2020-11-24 MED ORDER — PEGFILGRASTIM-CBQV 6 MG/0.6ML ~~LOC~~ SOSY
6.0000 mg | PREFILLED_SYRINGE | Freq: Once | SUBCUTANEOUS | Status: AC
  Administered 2020-11-24: 6 mg via SUBCUTANEOUS
  Filled 2020-11-24: qty 0.6

## 2020-11-24 NOTE — Progress Notes (Signed)
Nutrition Follow-up:   Patient with ovarian cancer.  Patient receiving chemotherapy.  Noted patient not a surgical candidate due to COPD.   Met with patient and boyfriend prior to injection.  Reports appetite has been good over the holidays.  Denies any nutrition impact symptoms.      Medications: reviewed  Labs: reviewed  Anthropometrics:   Weight 117 lb 11.6 oz on 1/5 increased from 115 lb on 12/1   NUTRITION DIAGNOSIS: Unintentional weight loss stable   INTERVENTION:  Continue high calorie and protein foods to maintain weight.    MONITORING, EVALUATION, GOAL: weight trends, intake   NEXT VISIT: Feb 18 at injection or phone call  Fronia Depass B. Zenia Resides, [employer], [place] Registered Dietitian [phone] (mobile)

## 2020-11-24 NOTE — Progress Notes (Signed)
Patient tolerated Udencya injection with no complaints voiced.  Site clean and dry with no bruising or swelling noted.  No complaints of pain.  Discharged with vital signs stable and no signs or symptoms of distress noted.  

## 2020-12-13 ENCOUNTER — Other Ambulatory Visit: Payer: Self-pay

## 2020-12-13 ENCOUNTER — Inpatient Hospital Stay (HOSPITAL_COMMUNITY): Payer: Medicare HMO

## 2020-12-13 ENCOUNTER — Inpatient Hospital Stay (HOSPITAL_BASED_OUTPATIENT_CLINIC_OR_DEPARTMENT_OTHER): Payer: Medicare HMO | Admitting: Hematology

## 2020-12-13 VITALS — BP 135/61 | HR 85 | Temp 97.2°F | Resp 18 | Wt 118.9 lb

## 2020-12-13 VITALS — BP 105/68 | HR 86 | Temp 97.5°F | Resp 18

## 2020-12-13 DIAGNOSIS — C569 Malignant neoplasm of unspecified ovary: Secondary | ICD-10-CM | POA: Diagnosis not present

## 2020-12-13 DIAGNOSIS — C786 Secondary malignant neoplasm of retroperitoneum and peritoneum: Secondary | ICD-10-CM

## 2020-12-13 DIAGNOSIS — Z95828 Presence of other vascular implants and grafts: Secondary | ICD-10-CM

## 2020-12-13 DIAGNOSIS — Z5111 Encounter for antineoplastic chemotherapy: Secondary | ICD-10-CM | POA: Diagnosis not present

## 2020-12-13 LAB — CBC WITH DIFFERENTIAL/PLATELET
Abs Immature Granulocytes: 0.01 10*3/uL (ref 0.00–0.07)
Basophils Absolute: 0 10*3/uL (ref 0.0–0.1)
Basophils Relative: 1 %
Eosinophils Absolute: 0 10*3/uL (ref 0.0–0.5)
Eosinophils Relative: 1 %
HCT: 29.7 % — ABNORMAL LOW (ref 36.0–46.0)
Hemoglobin: 9.4 g/dL — ABNORMAL LOW (ref 12.0–15.0)
Immature Granulocytes: 0 %
Lymphocytes Relative: 32 %
Lymphs Abs: 1.2 10*3/uL (ref 0.7–4.0)
MCH: 33 pg (ref 26.0–34.0)
MCHC: 31.6 g/dL (ref 30.0–36.0)
MCV: 104.2 fL — ABNORMAL HIGH (ref 80.0–100.0)
Monocytes Absolute: 0.5 10*3/uL (ref 0.1–1.0)
Monocytes Relative: 13 %
Neutro Abs: 2 10*3/uL (ref 1.7–7.7)
Neutrophils Relative %: 53 %
Platelets: 208 10*3/uL (ref 150–400)
RBC: 2.85 MIL/uL — ABNORMAL LOW (ref 3.87–5.11)
RDW: 15 % (ref 11.5–15.5)
WBC: 3.8 10*3/uL — ABNORMAL LOW (ref 4.0–10.5)
nRBC: 0 % (ref 0.0–0.2)

## 2020-12-13 LAB — COMPREHENSIVE METABOLIC PANEL
ALT: 10 U/L (ref 0–44)
AST: 16 U/L (ref 15–41)
Albumin: 3.9 g/dL (ref 3.5–5.0)
Alkaline Phosphatase: 67 U/L (ref 38–126)
Anion gap: 7 (ref 5–15)
BUN: 12 mg/dL (ref 8–23)
CO2: 30 mmol/L (ref 22–32)
Calcium: 8.9 mg/dL (ref 8.9–10.3)
Chloride: 102 mmol/L (ref 98–111)
Creatinine, Ser: 0.54 mg/dL (ref 0.44–1.00)
GFR, Estimated: >60 mL/min (ref >60)
Glucose, Bld: 99 mg/dL (ref 70–99)
Potassium: 3.6 mmol/L (ref 3.5–5.1)
Sodium: 139 mmol/L (ref 135–145)
Total Bilirubin: 0.4 mg/dL (ref 0.3–1.2)
Total Protein: 6.5 g/dL (ref 6.5–8.1)

## 2020-12-13 LAB — MAGNESIUM: Magnesium: 1.7 mg/dL (ref 1.7–2.4)

## 2020-12-13 MED ORDER — DIPHENHYDRAMINE HCL 50 MG/ML IJ SOLN
INTRAMUSCULAR | Status: AC
  Filled 2020-12-13: qty 1

## 2020-12-13 MED ORDER — SODIUM CHLORIDE 0.9 % IV SOLN: Freq: Once | INTRAVENOUS | Status: AC

## 2020-12-13 MED ORDER — FAMOTIDINE IN NACL 20-0.9 MG/50ML-% IV SOLN
20.0000 mg | Freq: Once | INTRAVENOUS | Status: AC
  Administered 2020-12-13: 20 mg via INTRAVENOUS

## 2020-12-13 MED ORDER — PALONOSETRON HCL INJECTION 0.25 MG/5ML
INTRAVENOUS | Status: AC
  Filled 2020-12-13: qty 5

## 2020-12-13 MED ORDER — SODIUM CHLORIDE 0.9 % IV SOLN
538.8000 mg | Freq: Once | INTRAVENOUS | Status: AC
  Administered 2020-12-13: 540 mg via INTRAVENOUS
  Filled 2020-12-13: qty 54

## 2020-12-13 MED ORDER — PALONOSETRON HCL INJECTION 0.25 MG/5ML
0.2500 mg | Freq: Once | INTRAVENOUS | Status: AC
  Administered 2020-12-13: 0.25 mg via INTRAVENOUS

## 2020-12-13 MED ORDER — SODIUM CHLORIDE 0.9 % IV SOLN
10.0000 mg | Freq: Once | INTRAVENOUS | Status: AC
  Administered 2020-12-13: 10 mg via INTRAVENOUS
  Filled 2020-12-13: qty 10

## 2020-12-13 MED ORDER — HEPARIN SOD (PORK) LOCK FLUSH 100 UNIT/ML IV SOLN
500.0000 [IU] | Freq: Once | INTRAVENOUS | Status: AC | PRN
Start: 2020-12-13 — End: 2020-12-13
  Administered 2020-12-13: 500 [IU]

## 2020-12-13 MED ORDER — FAMOTIDINE IN NACL 20-0.9 MG/50ML-% IV SOLN
INTRAVENOUS | Status: AC
  Filled 2020-12-13: qty 50

## 2020-12-13 MED ORDER — SODIUM CHLORIDE 0.9% FLUSH
10.0000 mL | INTRAVENOUS | Status: DC | PRN
  Administered 2020-12-13: 10 mL

## 2020-12-13 MED ORDER — SODIUM CHLORIDE 0.9 % IV SOLN
140.0000 mg/m2 | Freq: Once | INTRAVENOUS | Status: AC
  Administered 2020-12-13: 216 mg via INTRAVENOUS
  Filled 2020-12-13: qty 36

## 2020-12-13 MED ORDER — SODIUM CHLORIDE 0.9 % IV SOLN
150.0000 mg | Freq: Once | INTRAVENOUS | Status: AC
  Administered 2020-12-13: 150 mg via INTRAVENOUS
  Filled 2020-12-13: qty 150

## 2020-12-13 MED ORDER — DIPHENHYDRAMINE HCL 50 MG/ML IJ SOLN
50.0000 mg | Freq: Once | INTRAMUSCULAR | Status: AC
  Administered 2020-12-13: 50 mg via INTRAVENOUS

## 2020-12-13 NOTE — Progress Notes (Signed)
Patient was assessed by Dr. Katragadda and labs have been reviewed.  Patient is okay to proceed with treatment today. Primary RN and pharmacy aware.   

## 2020-12-13 NOTE — Progress Notes (Signed)
Pt here for D1C8 of carbo/taxol.  No complaints today. Labs WNL for treatment today.  Okay for treatment today.  Tolerated treatment well today without incidence.  Discharged via wheelchair in stable condition. Vital signs stable prior to discharge.

## 2020-12-13 NOTE — Progress Notes (Signed)
[address], [place] [address]   CLINIC:  Medical Oncology/Hematology  PCP:  [name], [place] / [place] [address] [phone]   REASON FOR VISIT:  Follow-up for high-grade serous ovarian carcinoma  PRIOR THERAPY: None  NGS Results: BRCA 1/2 negative  CURRENT THERAPY: Carboplatin, paclitaxel & Aloxi every 3 weeks  BRIEF ONCOLOGIC HISTORY:  Oncology History  Peritoneal carcinomatosis ([place])  06/06/2020 Initial Diagnosis   Peritoneal carcinomatosis ([place])   07/05/2020 -  Chemotherapy    Patient is on Treatment Plan: OVARIAN CARBOPLATIN (AUC 6) / PACLITAXEL (175) Q21D X 6 CYCLES      07/22/2020 Genetic Testing   Negative genetic testing: no pathogenic variants detected in [place] Expanded CancerNext Panel + RNAInsight. The CancerNext-Expanded gene panel offered by [facility] and includes sequencing and rearrangement analysis for the following 77 genes: AIP, ALK, APC*, ATM*, AXIN2, BAP1, BARD1, BLM, BMPR1A, BRCA1*, BRCA2*, BRIP1*, CDC73, CDH1*, CDK4, CDKN1B, CDKN2A, CHEK2*, CTNNA1, DICER1, FANCC, FH, FLCN, GALNT12, KIF1B, LZTR1, MAX, MEN1, MET, MLH1*, MSH2*, MSH3, MSH6*, MUTYH*, NBN, NF1*, NF2, NTHL1, PALB2*, PHOX2B, PMS2*, POT1, PRKAR1A, PTCH1, PTEN*, RAD51C*, RAD51D*, RB1, RECQL, RET, SDHA, SDHAF2, SDHB, SDHC, SDHD, SMAD4, SMARCA4, SMARCB1, SMARCE1, STK11, SUFU, TMEM127, TP53*, TSC1, TSC2, VHL and XRCC2 (sequencing and deletion/duplication); EGFR, EGLN1, HOXB13, KIT, MITF, PDGFRA, POLD1, and POLE (sequencing only); EPCAM and GREM1 (deletion/duplication only). DNA and RNA analyses performed for * genes. The report date is July 22, 2020.      CANCER STAGING: Cancer Staging No matching staging information was found for the patient.  INTERVAL HISTORY:  [name], a 65 y.o. female, returns for routine follow-up and consideration for next cycle of chemotherapy. Alece was last seen on 11/22/2020.  Due for cycle #8  of carboplatin, paclitaxel and Aloxi today.   Overall, she tells me she has been feeling pretty well. She tolerated the previous treatment well. Her SOB is stable and she continues having mild numbness in her fingertips; she continues taking gabapentin TID. She denies having any recent infections, F/C, night sweats or leg swelling.  Overall, she feels ready for next cycle of chemo today.    REVIEW OF SYSTEMS:  Review of Systems  Constitutional: Positive for fatigue (75%). Negative for appetite change, chills, diaphoresis and fever.  Respiratory: Positive for shortness of breath (stable).   Cardiovascular: Negative for leg swelling.  Neurological: Positive for numbness (mild in fingertips).  All other systems reviewed and are negative.   PAST MEDICAL/SURGICAL HISTORY:  Past Medical History:  Diagnosis Date  . Anemia   . Asthma   . Cancer [facility])    Gastric Cancer  . COPD (chronic obstructive pulmonary disease) ([place])   . High cholesterol   . Hypertension   . Neuropathy   . Pneumonia 2019  . Port-A-Cath in place 07/03/2020   Past Surgical History:  Procedure Laterality Date  . HALLUX VALGUS BASE WEDGE Right 06/09/2015   Procedure: Base wedge osteotomy with modified McBride right foot ;  Surgeon: Sharlotte Alamo, MD;  Location: [facility] ORS;  Service: Podiatry;  Laterality: Right;  . PORTACATH PLACEMENT Left 06/28/2020   Procedure: PORT-A-CATHETER PLACEMENT LEFT CHEST (attached catheter in left subclavian);  Surgeon: Virl Cagey, MD;  Location: [facility] ORS;  Service: General;  Laterality: Left;    SOCIAL HISTORY:  Social History   Socioeconomic History  . Marital status: Divorced    Spouse name: Not on file  . Number of children: 3  .  Years of education: Not on file  . Highest education level: Not on file  Occupational History  . Occupation: DISABLED  Tobacco Use  . Smoking status: Former Smoker    Packs/day: 2.00    Years: 30.00    Pack years: 60.00    Types: Cigarettes     Quit date: 11/04/2013    Years since quitting: 7.1  . Smokeless tobacco: Never Used  Vaping Use  . Vaping Use: Never used  Substance and Sexual Activity  . Alcohol use: No  . Drug use: No  . Sexual activity: Not Currently  Other Topics Concern  . Not on file  Social History Narrative  . Not on file   Social Determinants of Health   Financial Resource Strain: Low Risk   . Difficulty of Paying Living Expenses: Not hard at all  Food Insecurity: No Food Insecurity  . Worried About Charity fundraiser in the Last Year: Never true  . Ran Out of Food in the Last Year: Never true  Transportation Needs: No Transportation Needs  . Lack of Transportation (Medical): No  . Lack of Transportation (Non-Medical): No  Physical Activity: Inactive  . Days of Exercise per Week: 0 days  . Minutes of Exercise per Session: 0 min  Stress: No Stress Concern Present  . Feeling of Stress : Not at all  Social Connections: Socially Isolated  . Frequency of Communication with Friends and Family: More than three times a week  . Frequency of Social Gatherings with Friends and Family: Never  . Attends Religious Services: Never  . Active Member of Clubs or Organizations: No  . Attends Archivist Meetings: Never  . Marital Status: Divorced  Human resources officer Violence: Not At Risk  . Fear of Current or Ex-Partner: No  . Emotionally Abused: No  . Physically Abused: No  . Sexually Abused: No    FAMILY HISTORY:  Family History  Problem Relation Age of Onset  . Alzheimer's disease Mother   . COPD Father   . Emphysema Father   . Hypertension Father   . Healthy Sister   . Healthy Brother   . Alzheimer's disease Maternal Grandmother   . Healthy Sister   . Healthy Sister   . Healthy Sister   . Prostate cancer Other        paternal grandmother's brother; dx in early 89s  . Breast cancer Neg Hx     CURRENT MEDICATIONS:  Current Outpatient Medications  Medication Sig Dispense Refill  .  acetylcysteine (MUCOMYST) 20 % nebulizer solution Inhale into the lungs.     Marland Kitchen albuterol (PROVENTIL HFA;VENTOLIN HFA) 108 (90 BASE) MCG/ACT inhaler Inhale 4-6 puffs into the lungs every 6 (six) hours as needed for wheezing or shortness of breath.     Marland Kitchen amLODipine (NORVASC) 5 MG tablet Take 5 mg by mouth at bedtime.     Marland Kitchen aspirin EC 81 MG tablet Take 81 mg by mouth daily.    Marland Kitchen CARBOPLATIN IV Inject into the vein every 21 ( twenty-one) days.    . ferrous sulfate 325 (65 FE) MG tablet Take 325 mg by mouth every other day.    . Fluticasone-Umeclidin-Vilant (TRELEGY ELLIPTA) 100-62.5-25 MCG/INH AEPB Inhale into the lungs.    . gabapentin (NEURONTIN) 300 MG capsule Take 300 mg by mouth 3 (three) times daily.     Marland Kitchen ipratropium (ATROVENT HFA) 17 MCG/ACT inhaler Inhale 2 puffs into the lungs every 6 (six) hours as needed for wheezing.     Marland Kitchen  ipratropium-albuterol (DUONEB) 0.5-2.5 (3) MG/3ML SOLN Take 0.5 mg by nebulization every 6 (six) hours as needed.    . levalbuterol (XOPENEX) 1.25 MG/3ML nebulizer solution Inhale into the lungs.    . nortriptyline (PAMELOR) 25 MG capsule Take 25 mg by mouth at bedtime.    . ondansetron (ZOFRAN ODT) 4 MG disintegrating tablet 2m ODT q4 hours prn nausea/vomit 12 tablet 0  . OXYGEN Inhale 3 L into the lungs continuous.     .Marland KitchenPACLITAXEL IV Inject into the vein every 21 ( twenty-one) days.    . pantoprazole (PROTONIX) 40 MG tablet Take 1 tablet (40 mg total) by mouth daily. 30 tablet 2  . predniSONE (DELTASONE) 5 MG tablet Take 5 mg by mouth daily with breakfast.     . prochlorperazine (COMPAZINE) 10 MG tablet Take 1 tablet (10 mg total) by mouth every 6 (six) hours as needed (Nausea or vomiting). 30 tablet 1  . simvastatin (ZOCOR) 40 MG tablet Take 40 mg by mouth at bedtime.     No current facility-administered medications for this visit.    ALLERGIES:  No Known Allergies  PHYSICAL EXAM:  Performance status (ECOG): 1 - Symptomatic but completely  ambulatory  Vitals:   12/13/20 0826  BP: 135/61  Pulse: 85  Resp: 18  Temp: (!) 97.2 F (36.2 C)  SpO2: 94%   Wt Readings from Last 3 Encounters:  12/13/20 118 lb 14.4 oz (53.9 kg)  11/22/20 117 lb 11.6 oz (53.4 kg)  11/21/20 116 lb 9.6 oz (52.9 kg)   Physical Exam Vitals reviewed.  Constitutional:      Appearance: Normal appearance.  Cardiovascular:     Rate and Rhythm: Normal rate and regular rhythm.     Pulses: Normal pulses.     Heart sounds: Normal heart sounds.  Pulmonary:     Effort: Pulmonary effort is normal.     Breath sounds: Examination of the right-lower field reveals rales. Examination of the left-lower field reveals rales. Rales present.  Chest:     Comments: Port-a-Cath in L chest Abdominal:     Palpations: Abdomen is soft. There is no hepatomegaly, splenomegaly or mass.     Tenderness: There is no abdominal tenderness.     Hernia: No hernia is present.  Musculoskeletal:     Right lower leg: No edema.     Left lower leg: No edema.  Neurological:     General: No focal deficit present.     Mental Status: She is alert and oriented to person, place, and time.  Psychiatric:        Mood and Affect: Mood normal.        Behavior: Behavior normal.     LABORATORY DATA:  I have reviewed the labs as listed.  CBC Latest Ref Rng & Units 12/13/2020 11/22/2020 10/18/2020  WBC 4.0 - 10.5 K/uL 3.8(L) 5.6 3.9(L)  Hemoglobin 12.0 - 15.0 g/dL 9.4(L) 10.5(L) 8.3(L)  Hematocrit 36.0 - 46.0 % 29.7(L) 32.8(L) 25.5(L)  Platelets 150 - 400 K/uL 208 285 152   CMP Latest Ref Rng & Units 12/13/2020 11/22/2020 10/18/2020  Glucose 70 - 99 mg/dL 99 94 99  Boyd 8 - 23 mg/dL '12 13 12  ' Creatinine 0.44 - 1.00 mg/dL 0.54 0.49 0.54  Sodium 135 - 145 mmol/L 139 137 137  Potassium 3.5 - 5.1 mmol/L 3.6 3.9 3.4(L)  Chloride 98 - 111 mmol/L 102 97(L) 97(L)  CO2 22 - 32 mmol/L '30 30 31  ' Calcium 8.9 - 10.3 mg/dL 8.9  8.9 8.7(L)  Total Protein 6.5 - 8.1 g/dL 6.5 6.6 6.4(L)  Total Bilirubin 0.3  - 1.2 mg/dL 0.4 0.5 0.6  Alkaline Phos 38 - 126 U/L 67 66 77  AST 15 - 41 U/L '16 20 16  ' ALT 0 - 44 U/L '10 9 8   ' CA125 42.2 (H) 11/22/2020  CA125 52.2 (H) 10/18/2020  CA125 75.4 (H) 09/27/2020    DIAGNOSTIC IMAGING:  I have independently reviewed the scans and discussed with the patient. CT CHEST ABDOMEN PELVIS W CONTRAST  Result Date: 11/14/2020 CLINICAL DATA:  Ovarian cancer with peritoneal carcinomatosis, follow-up EXAM: CT CHEST, ABDOMEN, AND PELVIS WITH CONTRAST TECHNIQUE: Multidetector CT imaging of the chest, abdomen and pelvis was performed following the standard protocol during bolus administration of intravenous contrast. CONTRAST:  77m OMNIPAQUE IOHEXOL 300 MG/ML  SOLN COMPARISON:  09/01/2020 FINDINGS: CT CHEST FINDINGS Cardiovascular: Heart is normal in size.  No pericardial effusion. No evidence of thoracic aortic aneurysm. Atherosclerotic calcifications of the aortic arch. Mild coronary atherosclerosis of the LAD and left circumflex. Left chest port terminates at the cavoatrial junction. Mediastinum/Nodes: Small mediastinal lymph nodes, including an 8 mm short axis low right paratracheal node and an 8 mm short axis right azygoesophageal recess node, grossly unchanged. Visualized thyroid is unremarkable. Lungs/Pleura: Moderate centrilobular and paraseptal emphysematous changes, upper lung predominant. Prior 4 mm right upper lobe nodule is not well visualized on the current study. Bilateral lower lobe bronchiectasis with bronchial wall thickening in subpleural peribronchovascular nodularity, likely infectious inflammatory, possibly related to infectious bronchiolitis. Scarring in the right upper lobe (series 4/image 33). Additional mild lingular scarring. No focal consolidation. No pleural effusion or pneumothorax. Musculoskeletal: Visualized osseous structures are within normal limits. CT ABDOMEN PELVIS FINDINGS Hepatobiliary: Liver is within normal limits. Mild peritoneal nodularity  along the gallbladder measuring 8 x 14 mm (series 2/image 69), previously 1.9 x 3.0 cm. Gallbladder is otherwise within normal limits. No intrahepatic or extrahepatic ductal dilatation. Pancreas: Within normal limits. Spleen: Within normal limits. Adrenals/Urinary Tract: Adrenal glands are within normal limits. Kidneys are within normal limits.  No hydronephrosis. Low lying bladder, within normal limits. Stomach/Bowel: Stomach is within normal limits. No evidence of bowel obstruction. Normal appendix (series 2/image 95). Sigmoid diverticulosis, without evidence of diverticulitis. Vascular/Lymphatic: No evidence of abdominal aortic aneurysm. Atherosclerotic calcifications of the abdominal aorta and branch vessels. 9 mm short axis portacaval node (series 2/image 57), previously 11 mm. Adjacent upper abdominal nodes measuring up to 12 mm short axis are less conspicuous on the current study. Small retroperitoneal nodes measuring up to 5 mm short axis, improved. Small jejunal mesenteric nodes measuring up to 6 mm short axis (series 2/image 82), previously 10 mm. Reproductive: Heterogeneous uterus, reflecting uterine fibroids. No adnexal masses. Other: No abdominopelvic ascites. Associated peritoneal disease/omental caking, now measuring to 2.4 cm in thickness beneath the anterior abdominal wall (series 2/image 76), previously 3.1 cm. Musculoskeletal: Visualized osseous structures are within normal limits. IMPRESSION: Improving peritoneal disease/omental caking, as above. Small abdominopelvic lymph nodes, improved. No evidence of metastatic disease in the chest. Additional ancillary findings as above. Electronically Signed   By: SJulian HyM.D.   On: 11/14/2020 13:58     ASSESSMENT:  1. Peritoneal carcinomatosis: -Presentation to the ER with right lower quadrant abdominal pain on and off for 1 month. -CTAP on 05/29/2020 showed extensive nodularity throughout the omentum and upper peritoneal cavity. Both  ovaries are prominent although no well-defined mass is noted.Small volume ascites. -CA-125 is elevated at 2407. CEA  was 2.4. -CT of the chest shows 11 mm right paratracheal lymph node and 11 mm short axis precarinal lymph node. Subcarinal lymphadenopathy measuring 14 mm short axis. This is suspicious for metastatic disease. 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 -Cycle 1 of carboplatin and paclitaxel on 07/05/2020. -Evaluated by Dr. Denman George on 07/07/2020. Reevaluation after 3 cycles. -Possible surgical resection after 6 cycles. -CT CAP from 09/01/2020 showed mild improvement with reduction in the omental caking. Stable to minimally reduced adenopathy in the chest and abdomen. New 4 mm right upper lobe lung nodule could be inflammatory. -CT CAP on 11/07/2019 and after 6 cycles showed improved peritoneal disease/omental caking.  9 mm short axis portacaval lymph node and adjacent upper abdominal lymph nodes measuring 12 mm in short axis.  Small retroperitoneal lymph nodes up to 5 mm improved.  Peritoneal disease/omental caking now measures 2.4 cm in thickness. -CA-125 on 10/18/2020 improved 52.  2. COPD: -Quit smoking 6 years ago. Smoked 2 packs/day for 25-30 years. -Has been on3 L/min oxygen via nasal cannulafor the last 5 years.  3. Family history: -Paternal grandmother with colon cancer.   PLAN:  1.High-grade serous ovarian carcinoma: -Unfortunately she was not cleared for surgery by anesthesia/pulmonary. -She has tolerated last cycle reasonably well. -CA-125 has improved to 42.2 from 52. -Reviewed labs from 12/13/2020.  White count is 3.8 with normal ANC.  Hemoglobin is 9.4, likely from myelosuppression from chemotherapy.  LFTs are normal. -We will proceed with next cycle of chemotherapy with paclitaxel at 140 mg/M  square. -RTC 3 weeks for follow-up.  We will repeat scans in 3 months from the last.  2. Family history: -Germline mutation testing was negative.  3. Peripheral neuropathy: -Continue gabapentin 300 mg 3 times daily.  4.Severe COPD: -Continue prednisone 5 mg daily, Trelegy and Xopenex.  Breathing is stable.   Orders placed this encounter:  No orders of the defined types were placed in this encounter.    Derek Jack, MD [place] [phone]   I, Milinda Antis, am acting as a scribe for Dr. Sanda Linger.  I, Derek Jack MD, have reviewed the above documentation for accuracy and completeness, and I agree with the above.

## 2020-12-13 NOTE — Patient Instructions (Signed)
[facility] Discharge Instructions  You were seen today by Dr. Katragadda. He went over your recent results. You received your treatment today. Dr. Katragadda will see you back in 3 weeks for labs and follow up.   Thank you for choosing [facility] at [facility] to provide your oncology and hematology care.  To afford each patient quality time with our provider, please arrive at least 15 minutes before your scheduled appointment time.   If you have a lab appointment with the Cancer Center please come in thru the Main Entrance and check in at the main information desk  You need to re-schedule your appointment should you arrive 10 or more minutes late.  We strive to give you quality time with our providers, and arriving late affects you and other patients whose appointments are after yours.  Also, if you no show three or more times for appointments you may be dismissed from the clinic at the providers discretion.     Again, thank you for choosing [facility].  Our hope is that these requests will decrease the amount of time that you wait before being seen by our physicians.       _____________________________________________________________  Should you have questions after your visit to [facility], please contact our office at [phone] between the hours of 8:00 a.m. and 4:30 p.m.  Voicemails left after 4:00 p.m. will not be returned until the following business day.  For prescription refill requests, have your pharmacy contact our office and allow 72 hours.    Cancer Center Support Programs:   > Cancer Support Group  2nd Tuesday of the month 1pm-2pm, Journey Room    

## 2020-12-13 NOTE — Patient Instructions (Signed)
[facility] at [facility] Discharge Instructions  Labs drawn from portacath today   Thank you for choosing [facility] at [facility] to provide your oncology and hematology care.  To afford each patient quality time with our provider, please arrive at least 15 minutes before your scheduled appointment time.   If you have a lab appointment with the Cancer Center please come in thru the Main Entrance and check in at the main information desk.  You need to re-schedule your appointment should you arrive 10 or more minutes late.  We strive to give you quality time with our providers, and arriving late affects you and other patients whose appointments are after yours.  Also, if you no show three or more times for appointments you may be dismissed from the clinic at the providers discretion.     Again, thank you for choosing [facility].  Our hope is that these requests will decrease the amount of time that you wait before being seen by our physicians.       _____________________________________________________________  Should you have questions after your visit to [facility], please contact our office at [phone] and follow the prompts.  Our office hours are 8:00 a.m. and 4:30 p.m. Monday - Friday.  Please note that voicemails left after 4:00 p.m. may not be returned until the following business day.  We are closed weekends and major holidays.  You do have access to a nurse 24-7, just call the main number to the clinic [phone] and do not press any options, hold on the line and a nurse will answer the phone.    For prescription refill requests, have your pharmacy contact our office and allow 72 hours.    Due to Covid, you will need to wear a mask upon entering the hospital. If you do not have a mask, a mask will be given to you at the Main Entrance upon arrival. For doctor visits, patients may have 1 support person age 18  or older with them. For treatment visits, patients can not have anyone with them due to social distancing guidelines and our immunocompromised population.     

## 2020-12-13 NOTE — Patient Instructions (Signed)
[facility] Discharge Instructions for Patients Receiving Chemotherapy  Today you received the following chemotherapy agents   To help prevent nausea and vomiting after your treatment, we encourage you to take your nausea medication   If you develop nausea and vomiting that is not controlled by your nausea medication, call the clinic.   BELOW ARE SYMPTOMS THAT SHOULD BE REPORTED IMMEDIATELY:  *FEVER GREATER THAN 100.5 F  *CHILLS WITH OR WITHOUT FEVER  NAUSEA AND VOMITING THAT IS NOT CONTROLLED WITH YOUR NAUSEA MEDICATION  *UNUSUAL SHORTNESS OF BREATH  *UNUSUAL BRUISING OR BLEEDING  TENDERNESS IN MOUTH AND THROAT WITH OR WITHOUT PRESENCE OF ULCERS  *URINARY PROBLEMS  *BOWEL PROBLEMS  UNUSUAL RASH Items with * indicate a potential emergency and should be followed up as soon as possible.  Feel free to call the clinic should you have any questions or concerns. The clinic phone number is [phone].  Please show the CHEMO ALERT CARD at check-in to the Emergency Department and triage nurse.   

## 2020-12-14 LAB — CA 125: Cancer Antigen (CA) 125: 38.9 U/mL — ABNORMAL HIGH (ref 0.0–38.1)

## 2020-12-15 ENCOUNTER — Other Ambulatory Visit: Payer: Self-pay

## 2020-12-15 ENCOUNTER — Inpatient Hospital Stay (HOSPITAL_COMMUNITY): Payer: Medicare HMO

## 2020-12-15 VITALS — BP 114/53 | HR 120 | Temp 98.7°F | Resp 18 | Wt 119.4 lb

## 2020-12-15 DIAGNOSIS — C786 Secondary malignant neoplasm of retroperitoneum and peritoneum: Secondary | ICD-10-CM

## 2020-12-15 DIAGNOSIS — J189 Pneumonia, unspecified organism: Secondary | ICD-10-CM | POA: Diagnosis not present

## 2020-12-15 DIAGNOSIS — A419 Sepsis, unspecified organism: Secondary | ICD-10-CM | POA: Diagnosis not present

## 2020-12-15 DIAGNOSIS — Z95828 Presence of other vascular implants and grafts: Secondary | ICD-10-CM

## 2020-12-15 MED ORDER — PEGFILGRASTIM-CBQV 6 MG/0.6ML ~~LOC~~ SOSY
6.0000 mg | PREFILLED_SYRINGE | Freq: Once | SUBCUTANEOUS | Status: AC
  Administered 2020-12-15: 6 mg via SUBCUTANEOUS
  Filled 2020-12-15: qty 0.6

## 2020-12-15 NOTE — Progress Notes (Signed)
[name] patient stated that she had a fever of 101F last night. Her temperature is normal today 98.73F. presents today for injection per the provider's orders.  Udenyca administration without incident; injection site WNL; see MAR for injection details.  Patient tolerated procedure well and without incident.  No questions or complaints noted at this time.

## 2020-12-17 ENCOUNTER — Encounter (HOSPITAL_COMMUNITY): Payer: Self-pay | Admitting: Emergency Medicine

## 2020-12-17 ENCOUNTER — Inpatient Hospital Stay (HOSPITAL_COMMUNITY)
Admission: EM | Admit: 2020-12-17 | Discharge: 2020-12-20 | DRG: 871 | Disposition: A | Discharge status: Home Health | Payer: Medicare HMO | Attending: Family Medicine | Admitting: Family Medicine

## 2020-12-17 ENCOUNTER — Other Ambulatory Visit: Payer: Self-pay

## 2020-12-17 ENCOUNTER — Emergency Department (HOSPITAL_COMMUNITY): Payer: Medicare HMO

## 2020-12-17 DIAGNOSIS — E785 Hyperlipidemia, unspecified: Secondary | ICD-10-CM | POA: Diagnosis present

## 2020-12-17 DIAGNOSIS — J181 Lobar pneumonia, unspecified organism: Secondary | ICD-10-CM

## 2020-12-17 DIAGNOSIS — A419 Sepsis, unspecified organism: Secondary | ICD-10-CM | POA: Diagnosis present

## 2020-12-17 DIAGNOSIS — Z825 Family history of asthma and other chronic lower respiratory diseases: Secondary | ICD-10-CM

## 2020-12-17 DIAGNOSIS — E871 Hypo-osmolality and hyponatremia: Secondary | ICD-10-CM | POA: Diagnosis present

## 2020-12-17 DIAGNOSIS — Z9981 Dependence on supplemental oxygen: Secondary | ICD-10-CM

## 2020-12-17 DIAGNOSIS — E7800 Pure hypercholesterolemia, unspecified: Secondary | ICD-10-CM | POA: Diagnosis present

## 2020-12-17 DIAGNOSIS — J9611 Chronic respiratory failure with hypoxia: Secondary | ICD-10-CM | POA: Diagnosis present

## 2020-12-17 DIAGNOSIS — J440 Chronic obstructive pulmonary disease with acute lower respiratory infection: Secondary | ICD-10-CM | POA: Diagnosis present

## 2020-12-17 DIAGNOSIS — Z87891 Personal history of nicotine dependence: Secondary | ICD-10-CM

## 2020-12-17 DIAGNOSIS — Z8543 Personal history of malignant neoplasm of ovary: Secondary | ICD-10-CM | POA: Diagnosis not present

## 2020-12-17 DIAGNOSIS — E869 Volume depletion, unspecified: Secondary | ICD-10-CM | POA: Diagnosis present

## 2020-12-17 DIAGNOSIS — Z79899 Other long term (current) drug therapy: Secondary | ICD-10-CM | POA: Diagnosis not present

## 2020-12-17 DIAGNOSIS — Z20822 Contact with and (suspected) exposure to covid-19: Secondary | ICD-10-CM | POA: Diagnosis present

## 2020-12-17 DIAGNOSIS — I10 Essential (primary) hypertension: Secondary | ICD-10-CM | POA: Diagnosis present

## 2020-12-17 DIAGNOSIS — C786 Secondary malignant neoplasm of retroperitoneum and peritoneum: Secondary | ICD-10-CM | POA: Diagnosis present

## 2020-12-17 DIAGNOSIS — E876 Hypokalemia: Secondary | ICD-10-CM | POA: Diagnosis not present

## 2020-12-17 DIAGNOSIS — J9610 Chronic respiratory failure, unspecified whether with hypoxia or hypercapnia: Secondary | ICD-10-CM | POA: Diagnosis present

## 2020-12-17 DIAGNOSIS — Z85028 Personal history of other malignant neoplasm of stomach: Secondary | ICD-10-CM

## 2020-12-17 DIAGNOSIS — Z7982 Long term (current) use of aspirin: Secondary | ICD-10-CM

## 2020-12-17 DIAGNOSIS — Z7952 Long term (current) use of systemic steroids: Secondary | ICD-10-CM

## 2020-12-17 DIAGNOSIS — Z8249 Family history of ischemic heart disease and other diseases of the circulatory system: Secondary | ICD-10-CM | POA: Diagnosis not present

## 2020-12-17 DIAGNOSIS — W06XXXA Fall from bed, initial encounter: Secondary | ICD-10-CM | POA: Diagnosis present

## 2020-12-17 DIAGNOSIS — C569 Malignant neoplasm of unspecified ovary: Secondary | ICD-10-CM | POA: Diagnosis present

## 2020-12-17 DIAGNOSIS — R55 Syncope and collapse: Secondary | ICD-10-CM | POA: Diagnosis present

## 2020-12-17 DIAGNOSIS — J189 Pneumonia, unspecified organism: Secondary | ICD-10-CM | POA: Diagnosis present

## 2020-12-17 LAB — COMPREHENSIVE METABOLIC PANEL
ALT: 8 U/L (ref 0–44)
AST: 16 U/L (ref 15–41)
Albumin: 3.2 g/dL — ABNORMAL LOW (ref 3.5–5.0)
Alkaline Phosphatase: 93 U/L (ref 38–126)
Anion gap: 10 (ref 5–15)
BUN: 12 mg/dL (ref 8–23)
CO2: 31 mmol/L (ref 22–32)
Calcium: 8.6 mg/dL — ABNORMAL LOW (ref 8.9–10.3)
Chloride: 87 mmol/L — ABNORMAL LOW (ref 98–111)
Creatinine, Ser: 0.47 mg/dL (ref 0.44–1.00)
GFR, Estimated: >60 mL/min (ref >60)
Glucose, Bld: 121 mg/dL — ABNORMAL HIGH (ref 70–99)
Potassium: 3.3 mmol/L — ABNORMAL LOW (ref 3.5–5.1)
Sodium: 128 mmol/L — ABNORMAL LOW (ref 135–145)
Total Bilirubin: 0.3 mg/dL (ref 0.3–1.2)
Total Protein: 6.4 g/dL — ABNORMAL LOW (ref 6.5–8.1)

## 2020-12-17 LAB — CREATININE, SERUM
Creatinine, Ser: 0.44 mg/dL (ref 0.44–1.00)
GFR, Estimated: >60 mL/min (ref >60)

## 2020-12-17 LAB — CBC WITH DIFFERENTIAL/PLATELET
Band Neutrophils: 4 %
Basophils Absolute: 0 10*3/uL (ref 0.0–0.1)
Basophils Relative: 0 %
Eosinophils Absolute: 0 10*3/uL (ref 0.0–0.5)
Eosinophils Relative: 0 %
HCT: 27.3 % — ABNORMAL LOW (ref 36.0–46.0)
Hemoglobin: 8.7 g/dL — ABNORMAL LOW (ref 12.0–15.0)
Lymphocytes Relative: 2 %
Lymphs Abs: 0.3 10*3/uL — ABNORMAL LOW (ref 0.7–4.0)
MCH: 33.5 pg (ref 26.0–34.0)
MCHC: 31.9 g/dL (ref 30.0–36.0)
MCV: 105 fL — ABNORMAL HIGH (ref 80.0–100.0)
Monocytes Absolute: 0.3 10*3/uL (ref 0.1–1.0)
Monocytes Relative: 2 %
Neutro Abs: 15.3 10*3/uL — ABNORMAL HIGH (ref 1.7–7.7)
Neutrophils Relative %: 92 %
Platelets: 169 10*3/uL (ref 150–400)
RBC: 2.6 MIL/uL — ABNORMAL LOW (ref 3.87–5.11)
RDW: 14.8 % (ref 11.5–15.5)
WBC: 15.9 10*3/uL — ABNORMAL HIGH (ref 4.0–10.5)
nRBC: 0 % (ref 0.0–0.2)

## 2020-12-17 LAB — URINALYSIS, ROUTINE W REFLEX MICROSCOPIC
Bilirubin Urine: NEGATIVE
Glucose, UA: NEGATIVE mg/dL
Hgb urine dipstick: NEGATIVE
Ketones, ur: NEGATIVE mg/dL
Leukocytes,Ua: NEGATIVE
Nitrite: NEGATIVE
Protein, ur: NEGATIVE mg/dL
Specific Gravity, Urine: 1.009 (ref 1.005–1.030)
pH: 6 (ref 5.0–8.0)

## 2020-12-17 LAB — CBC
HCT: 24.4 % — ABNORMAL LOW (ref 36.0–46.0)
Hemoglobin: 7.8 g/dL — ABNORMAL LOW (ref 12.0–15.0)
MCH: 33.3 pg (ref 26.0–34.0)
MCHC: 32 g/dL (ref 30.0–36.0)
MCV: 104.3 fL — ABNORMAL HIGH (ref 80.0–100.0)
Platelets: 143 10*3/uL — ABNORMAL LOW (ref 150–400)
RBC: 2.34 MIL/uL — ABNORMAL LOW (ref 3.87–5.11)
RDW: 14.8 % (ref 11.5–15.5)
WBC: 14.2 10*3/uL — ABNORMAL HIGH (ref 4.0–10.5)
nRBC: 0 % (ref 0.0–0.2)

## 2020-12-17 LAB — SARS CORONAVIRUS 2 BY RT PCR (HOSPITAL ORDER, PERFORMED IN ~~LOC~~ HOSPITAL LAB): SARS Coronavirus 2: NEGATIVE

## 2020-12-17 LAB — CBG MONITORING, ED: Glucose-Capillary: 129 mg/dL — ABNORMAL HIGH (ref 70–99)

## 2020-12-17 LAB — PROTIME-INR
INR: 1 (ref 0.8–1.2)
Prothrombin Time: 12.5 seconds (ref 11.4–15.2)

## 2020-12-17 LAB — LACTIC ACID, PLASMA
Lactic Acid, Venous: 1.1 mmol/L (ref 0.5–1.9)
Lactic Acid, Venous: 1 mmol/L (ref 0.5–1.9)

## 2020-12-17 LAB — APTT: aPTT: 27 seconds (ref 24–36)

## 2020-12-17 MED ORDER — VANCOMYCIN HCL 750 MG/150ML IV SOLN
750.0000 mg | INTRAVENOUS | Status: DC
  Administered 2020-12-18: 750 mg via INTRAVENOUS
  Filled 2020-12-17 (×3): qty 150

## 2020-12-17 MED ORDER — ACETAMINOPHEN 500 MG PO TABS
1000.0000 mg | ORAL_TABLET | Freq: Once | ORAL | Status: AC
  Administered 2020-12-17: 1000 mg via ORAL
  Filled 2020-12-17: qty 2

## 2020-12-17 MED ORDER — ALBUTEROL SULFATE HFA 108 (90 BASE) MCG/ACT IN AERS: 4.0000 | INHALATION_SPRAY | Freq: Once | RESPIRATORY_TRACT | Status: DC

## 2020-12-17 MED ORDER — VANCOMYCIN HCL 1250 MG/250ML IV SOLN
1250.0000 mg | Freq: Once | INTRAVENOUS | Status: AC
  Administered 2020-12-17: 1250 mg via INTRAVENOUS
  Filled 2020-12-17: qty 250

## 2020-12-17 MED ORDER — AEROCHAMBER PLUS FLO-VU MEDIUM MISC
1.0000 | Freq: Once | Status: DC
  Filled 2020-12-17: qty 1

## 2020-12-17 MED ORDER — ENOXAPARIN SODIUM 40 MG/0.4ML ~~LOC~~ SOLN
40.0000 mg | SUBCUTANEOUS | Status: DC
  Administered 2020-12-17 – 2020-12-19 (×3): 40 mg via SUBCUTANEOUS
  Filled 2020-12-17 (×3): qty 0.4

## 2020-12-17 MED ORDER — LACTATED RINGERS IV SOLN: INTRAVENOUS | Status: DC

## 2020-12-17 MED ORDER — SODIUM CHLORIDE 0.9 % IV SOLN: INTRAVENOUS | Status: AC

## 2020-12-17 MED ORDER — SODIUM CHLORIDE 0.9 % IV SOLN
2.0000 g | Freq: Three times a day (TID) | INTRAVENOUS | Status: DC
  Administered 2020-12-18 – 2020-12-20 (×8): 2 g via INTRAVENOUS
  Filled 2020-12-17 (×13): qty 2

## 2020-12-17 MED ORDER — SODIUM CHLORIDE 0.9 % IV SOLN
500.0000 mg | INTRAVENOUS | Status: DC
  Administered 2020-12-17: 500 mg via INTRAVENOUS
  Filled 2020-12-17: qty 500

## 2020-12-17 MED ORDER — SODIUM CHLORIDE 0.9 % IV SOLN
2.0000 g | INTRAVENOUS | Status: DC
  Administered 2020-12-17: 2 g via INTRAVENOUS
  Filled 2020-12-17: qty 20

## 2020-12-17 NOTE — Progress Notes (Signed)
Pharmacy Antibiotic Note  [name] is a 65 y.o. female admitted on 12/17/2020 with pneumonia.  Pharmacy has been consulted for vancomycin and cefepime  dosing.  Plan: Loading dose: vancomycin 1.25g  IV x1 dose Maintenance dose: vancomycin 750mg  IV  q24h Goal vancomycin trough range:   15-20   mcg/mL Pharmacy will continue to monitor renal function, vancomycin troughs as clinically appropriate,  cultures and patient progress.    Height: 5' (152.4 cm) Weight: 53.5 kg (118 lb) IBW/kg (Calculated) : 45.5  Temp (24hrs), Avg:98.3 F (36.8 C), Min:97.6 F (36.4 C), Max:99 F (37.2 C)  Recent Labs  Lab 12/13/20 0846 12/17/20 1904  WBC 3.8* 15.9*  CREATININE 0.54 0.47  LATICACIDVEN  --  1.1    Estimated Creatinine Clearance: 52.4 mL/min (by C-G formula based on SCr of 0.47 mg/dL).    No Known Allergies  Antimicrobials this admission: ceftriaxone 1/30 >> x1 dose in ED cefepime 1/31 >>   vancomycin 1/31>>   Microbiology results: 1/30 BC x2:   1/30 UCx:    1/30 Resp PCR: SARS CoV-2 negative; Flu A/B negative 1/30 MRSA PCR:   Thank you for allowing pharmacy to be a part of this patient's care.  Despina Pole 12/17/2020 9:03 PM

## 2020-12-17 NOTE — Progress Notes (Signed)
elink following for sepsis protocol  

## 2020-12-17 NOTE — H&P (Signed)
History and Physical    [name] [id number] DOB: 09-29-1956 DOA: 12/17/2020  PCP: [name], MD  Patient coming from: Home  Chief Complaint: Syncopal episode  HPI: [name] is a 65 y.o. female with medical history significant of ovarian cancer on chemo, COPD, chronic respiratory failure on 3 L of oxygen continuous at home comes in with a syncopal episode.  Patient has been fighting pneumonia and has been on Levaquin for about 48 hours.  She has persistently had fevers chills and cough.  She does not remember falling out of her bed and passing out.  However her boyfriend found her on the floor passed out.  EMS was called she was brought to the emergency department.  She denies any nausea vomiting or diarrhea.  She feels back to normal.  Again has no idea what occurred during the syncopal episode.  Patient found to have pneumonia was tachycardic on arrival thought to be septic referred for admission for pneumonia failing outpatient treatment with Levaquin.  Review of Systems: As per HPI otherwise 10 point review of systems negative.   Past Medical History:  Diagnosis Date  . Anemia   . Asthma   . Cancer [facility])    Gastric Cancer  . COPD (chronic obstructive pulmonary disease) ([place])   . High cholesterol   . Hypertension   . Neuropathy   . Pneumonia 2019  . Port-A-Cath in place 07/03/2020    Past Surgical History:  Procedure Laterality Date  . HALLUX VALGUS BASE WEDGE Right 06/09/2015   Procedure: Base wedge osteotomy with modified McBride right foot ;  Surgeon: Sharlotte Alamo, MD;  Location: [facility] ORS;  Service: Podiatry;  Laterality: Right;  . PORTACATH PLACEMENT Left 06/28/2020   Procedure: PORT-A-CATHETER PLACEMENT LEFT CHEST (attached catheter in left subclavian);  Surgeon: Virl Cagey, MD;  Location: [facility] ORS;  Service: General;  Laterality: Left;     reports that she quit smoking about 7 years ago. Her smoking use included cigarettes. She has a 60.00 pack-year  smoking history. She has never used smokeless tobacco. She reports that she does not drink alcohol and does not use drugs.  No Known Allergies  Family History  Problem Relation Age of Onset  . Alzheimer's disease Mother   . COPD Father   . Emphysema Father   . Hypertension Father   . Healthy Sister   . Healthy Brother   . Alzheimer's disease Maternal Grandmother   . Healthy Sister   . Healthy Sister   . Healthy Sister   . Prostate cancer Other        paternal grandmother's brother; dx in early 41s  . Breast cancer Neg Hx     Prior to Admission medications   Medication Sig Start Date End Date Taking? Authorizing Provider  pantoprazole (PROTONIX) 40 MG tablet Take 1 tablet (40 mg total) by mouth daily. 01/28/18  Yes Gladstone Lighter, MD  simvastatin (ZOCOR) 40 MG tablet Take 40 mg by mouth at bedtime.   Yes [provider]  acetylcysteine (MUCOMYST) 20 % nebulizer solution Inhale into the lungs.  08/14/20 08/14/21  [provider]  albuterol (PROVENTIL HFA;VENTOLIN HFA) 108 (90 BASE) MCG/ACT inhaler Inhale 4-6 puffs into the lungs every 6 (six) hours as needed for wheezing or shortness of breath.     [provider]  amLODipine (NORVASC) 5 MG tablet Take 5 mg by mouth at bedtime.     [provider]  aspirin EC 81 MG tablet Take  81 mg by mouth daily.    [provider]  CARBOPLATIN IV Inject into the vein every 21 ( twenty-one) days. 07/05/20   [provider]  ferrous sulfate 325 (65 FE) MG tablet Take 325 mg by mouth every other day.    [provider]  Fluticasone-Umeclidin-Vilant (TRELEGY ELLIPTA) 100-62.5-25 MCG/INH AEPB Inhale into the lungs. 01/21/20   [provider]  gabapentin (NEURONTIN) 300 MG capsule Take 300 mg by mouth 3 (three) times daily.     [provider]  ipratropium (ATROVENT HFA) 17 MCG/ACT inhaler Inhale 2 puffs into the lungs every 6 (six) hours as needed for wheezing.     [provider]  ipratropium-albuterol (DUONEB) 0.5-2.5 (3) MG/3ML SOLN Take 0.5 mg by nebulization every 6 (six) hours as needed. 08/03/12   [provider]  levalbuterol Penne Lash) 1.25 MG/3ML nebulizer solution Inhale into the lungs. 08/21/20   [provider]  levofloxacin (LEVAQUIN) 500 MG tablet Take by mouth. 12/14/20 12/24/20  [provider]  nortriptyline (PAMELOR) 25 MG capsule Take 25 mg by mouth at bedtime.    [provider]  ondansetron (ZOFRAN ODT) 4 MG disintegrating tablet 4mg  ODT q4 hours prn nausea/vomit 07/08/20   Milton Ferguson, MD  OXYGEN Inhale 3 L into the lungs continuous.     [provider]  PACLITAXEL IV Inject into the vein every 21 ( twenty-one) days. 07/05/20   [provider]  predniSONE (DELTASONE) 5 MG tablet Take 5 mg by mouth daily with breakfast.     [provider]  prochlorperazine (COMPAZINE) 10 MG tablet Take 1 tablet (10 mg total) by mouth every 6 (six) hours as needed (Nausea or vomiting). 07/03/20   Derek Jack, MD    Physical Exam: Vitals:   12/17/20 1759 12/17/20 1930 12/17/20 1959 12/17/20 2000  BP:  108/65  107/60  Pulse:  98  99  Resp:  18  20  Temp:   99 F (37.2 C)   TempSrc:      SpO2: 94% 99%  99%  Weight:      Height:          Constitutional: NAD, calm, comfortable Vitals:   12/17/20 1759 12/17/20 1930 12/17/20 1959 12/17/20 2000  BP:  108/65  107/60  Pulse:  98  99  Resp:  18  20  Temp:   99 F (37.2 C)   TempSrc:      SpO2: 94% 99%  99%  Weight:      Height:       Eyes: PERRL, lids and conjunctivae normal ENMT: Mucous membranes are moist. Posterior pharynx clear of any exudate or lesions.Normal dentition.  Neck: normal, supple, no masses, no thyromegaly Respiratory: clear to auscultation bilaterally, no wheezing, no crackles. Normal respiratory effort. No accessory muscle use.  Cardiovascular: Regular rate and rhythm, no murmurs / rubs / gallops. No  extremity edema. 2+ pedal pulses. No carotid bruits.  Abdomen: no tenderness, no masses palpated. No hepatosplenomegaly. Bowel sounds positive.  Musculoskeletal: no clubbing / cyanosis. No joint deformity upper and lower extremities. Good ROM, no contractures. Normal muscle tone.  Skin: no rashes, lesions, ulcers. No induration Neurologic: CN 2-12 grossly intact. Sensation intact, DTR normal. Strength 5/5 in all 4.  Psychiatric: Normal judgment and insight. Alert and oriented x 3. Normal mood.    Labs on Admission: I have personally reviewed following labs and imaging studies  CBC: Recent Labs  Lab 12/13/20 0846 12/17/20 1904  WBC 3.8* 15.9*  NEUTROABS 2.0 15.3*  HGB 9.4* 8.7*  HCT 29.7* 27.3*  MCV 104.2* 105.0*  PLT 208 [id number]   Basic Metabolic Panel: Recent Labs  Lab 12/13/20 0846 12/17/20 1904  NA 139 128*  K 3.6 3.3*  CL 102 87*  CO2 30 31  GLUCOSE 99 121*  BUN 12 12  CREATININE 0.54 0.47  CALCIUM 8.9 8.6*  MG 1.7  --    GFR: Estimated Creatinine Clearance: 52.4 mL/min (by C-G formula based on SCr of 0.47 mg/dL). Liver Function Tests: Recent Labs  Lab 12/13/20 0846 12/17/20 1904  AST 16 16  ALT 10 8  ALKPHOS 67 93  BILITOT 0.4 0.3  PROT 6.5 6.4*  ALBUMIN 3.9 3.2*   No results for input(s): LIPASE, AMYLASE in the last 168 hours. No results for input(s): AMMONIA in the last 168 hours. Coagulation Profile: Recent Labs  Lab 12/17/20 1904  INR 1.0   Cardiac Enzymes: No results for input(s): CKTOTAL, CKMB, CKMBINDEX, TROPONINI in the last 168 hours. BNP (last 3 results) No results for input(s): PROBNP in the last 8760 hours. HbA1C: No results for input(s): HGBA1C in the last 72 hours. CBG: Recent Labs  Lab 12/17/20 1755  GLUCAP 129*   Lipid Profile: No results for input(s): CHOL, HDL, LDLCALC, TRIG, CHOLHDL, LDLDIRECT in the last 72 hours. Thyroid Function Tests: No results for input(s): TSH, T4TOTAL, FREET4, T3FREE, THYROIDAB in the last 72  hours. Anemia Panel: No results for input(s): VITAMINB12, FOLATE, FERRITIN, TIBC, IRON, RETICCTPCT in the last 72 hours. Urine analysis:    Component Value Date/Time   COLORURINE AMBER (A) 07/13/2020 1117   APPEARANCEUR HAZY (A) 07/13/2020 1117   APPEARANCEUR Clear 10/08/2013 2007   LABSPEC 1.023 07/13/2020 1117   LABSPEC 1.003 10/08/2013 2007   PHURINE 5.0 07/13/2020 1117   GLUCOSEU NEGATIVE 07/13/2020 1117   GLUCOSEU Negative 10/08/2013 2007   HGBUR NEGATIVE 07/13/2020 1117   BILIRUBINUR SMALL (A) 07/13/2020 1117   BILIRUBINUR Negative 10/08/2013 2007   KETONESUR 5 (A) 07/13/2020 1117   PROTEINUR 30 (A) 07/13/2020 1117   NITRITE POSITIVE (A) 07/13/2020 1117   LEUKOCYTESUR SMALL (A) 07/13/2020 1117   LEUKOCYTESUR Negative 10/08/2013 2007   Sepsis Labs: !!!!!!!!!!!!!!!!!!!!!!!!!!!!!!!!!!!!!!!!!!!! @LABRCNTIP (procalcitonin:4,lacticidven:4) )No results found for this or any previous visit (from the past 240 hour(s)).   Radiological Exams on Admission: CT Head Wo Contrast  Result Date: 12/17/2020 CLINICAL DATA:  Syncopal episode resulting in a fall, hitting the back of her head. EXAM: CT HEAD WITHOUT CONTRAST TECHNIQUE: Contiguous axial images were obtained from the base of the skull through the vertex without intravenous contrast. COMPARISON:  None. FINDINGS: Brain: Normal appearing cerebral hemispheres and posterior fossa structures. Normal size and position of the ventricles. No intracranial hemorrhage, mass lesion or CT evidence of acute infarction. Vascular: No hyperdense vessel or unexpected calcification. Skull: Normal. Negative for fracture or focal lesion. Sinuses/Orbits: Unremarkable. Other: None. IMPRESSION: Normal examination. Electronically Signed   By: Claudie Revering M.D.   On: 12/17/2020 19:04   DG Chest Port 1 View  Result Date: 12/17/2020 CLINICAL DATA:  Questionable sepsis - evaluate for abnormality Syncope fall out of bed. EXAM: PORTABLE CHEST 1 VIEW COMPARISON:  Chest  radiograph 10/16/2020.  Chest CT 11/14/2020 FINDINGS: Left-sided chest port in place. New patchy opacity in the suprahilar left upper lobe. There are streaky opacities in both lung bases. Stable heart size and mediastinal contours. No pneumothorax or pleural effusion. Remote left sixth rib fracture. No acute osseous abnormalities are seen. IMPRESSION: Patchy suprahilar  left upper lobe opacity, new from CT last month, suspicious for pneumonia. Streaky bibasilar opacities, may be atelectasis or additional sites of pneumonia. Electronically Signed   By: Keith Rake M.D.   On: 12/17/2020 19:07   Old chart reviewed Case discussed with EDP  Assessment/Plan   65 year old female with pneumonia failing outpatient treatment with [facility]  Principal Problem:    CAP (community acquired pneumonia)-has been on Levaquin.  Feels better since arrival to the ED.  Received IV fluids.  Placed on vancomycin and cefepime.  Follow-up on cultures.  Patient does not look septic but does meet SIRS criteria.  Active Problems:    Syncope-no focal neurological deficits.  Continue to monitor.    Chronic respiratory failure (HCC)-chronically on 3 L stable at 3 L now.   Ovarian cancer (HCC)-continue outpatient follow-up and chemotherapy.    Further recommendations pending on overall hospital course   DVT prophylaxis: SCDs Code Status: Full Family Communication: None Disposition Plan: Days Consults called: None Admission status: Admission   Rustin Erhart A MD [facility]  If 7PM-7AM, please contact night-coverage [email or web address] Password [email or web address]  12/17/2020, 8:07 PM

## 2020-12-17 NOTE — ED Triage Notes (Addendum)
Pt to the ED via [facility] with her son with reports of Loss of consciousness and unresponsive for 10 minutes after a fall out of bed. Pt does not remember falling out of bed.   Pt's son said he had just walked out of the room after preparing her nebulizer treatment. He walked to the LR and sat in the recliner and heard her fall out of bed.  Pt has ovarian CA and her last chemo treatment was on Wednesday. Her last injection after chemo was on Friday morning. The pt started feeling poorly Friday night and started running a slight fever today. Pt wears 3L  at baseline.  Pt states she hit the back of her head, denies taking blood thinners, and has left side rib pain

## 2020-12-17 NOTE — ED Provider Notes (Signed)
[facility] Provider Note   CSN: [id number] Arrival date & time: 12/17/20  1738     History Chief Complaint  Patient presents with  . Loss of Consciousness    [name] is a 65 y.o. female.  HPI   65 year old female, history of ovarian cancer, history of COPD on chronic oxygen.  She has a Port-A-Cath in her left upper chest.  She presents in the care of her boyfriend who is with her today when she had a syncopal event.  Evidently the patient had been laying in bed in a semirecumbent position, he left the room, he heard a thud and went back into the room where she was unconscious on the floor.  She was unconscious for approximately 8 minutes though she was breathing, she was totally unresponsive to voice and painful stimuli according to the patient's boyfriend.  For the last 3 days she has had fevers as high as 101, increasing shortness of breath and coughing, bringing up sputum more than she usually does.  She has been on Levaquin for 48 hours after it was called in by her family doctor.  She denies urinary symptoms though she states her urine is dark in color.  She has no swelling or rashes, no abdominal pain, she has a headache after the fall, she does not have any visual changes.  There was no postictal state, she was awake and alert on the bed once he got her up to the bed, again this was about 8 to 10 minutes after the initial fall.  The patient had no urinary incontinence, no tongue biting, she was brought by private vehicle as he was not willing to contact EMS services for transport.  Past Medical History:  Diagnosis Date  . Anemia   . Asthma   . Cancer [facility])    Gastric Cancer  . COPD (chronic obstructive pulmonary disease) ([place])   . High cholesterol   . Hypertension   . Neuropathy   . Pneumonia 2019  . Port-A-Cath in place 07/03/2020    Patient Active Problem List   Diagnosis Date Noted  . Syncope 12/17/2020  . Genetic testing 08/01/2020  .  Ovarian cancer ([place]) 07/07/2020  . Port-A-Cath in place 07/03/2020  . Family history of prostate cancer 07/03/2020  . Goals of care, counseling/discussion 07/01/2020  . Peritoneal carcinomatosis ([place]) 06/06/2020  . Pneumonia 09/13/2018  . HCAP (healthcare-associated pneumonia) 01/29/2018  . Acute on chronic respiratory failure ([place]) 01/22/2018  . CAP (community acquired pneumonia) 01/18/2016  . Anemia, iron deficiency 11/07/2015  . Chronic respiratory failure ([place]) 11/07/2015  . Acute on chronic respiratory failure with hypoxia ([place]) 11/07/2015  . Hyperlipidemia 11/07/2015  . COPD with acute exacerbation ([place]) 11/06/2015  . Hypertension 11/06/2015    Past Surgical History:  Procedure Laterality Date  . HALLUX VALGUS BASE WEDGE Right 06/09/2015   Procedure: Base wedge osteotomy with modified McBride right foot ;  Surgeon: Sharlotte Alamo, MD;  Location: [facility] ORS;  Service: Podiatry;  Laterality: Right;  . PORTACATH PLACEMENT Left 06/28/2020   Procedure: PORT-A-CATHETER PLACEMENT LEFT CHEST (attached catheter in left subclavian);  Surgeon: Virl Cagey, MD;  Location: [facility];  Service: General;  Laterality: Left;     OB History   No obstetric history on file.     Family History  Problem Relation Age of Onset  . Alzheimer's disease Mother   . COPD Father   . Emphysema Father   . Hypertension Father   . Healthy  Sister   . Healthy Brother   . Alzheimer's disease Maternal Grandmother   . Healthy Sister   . Healthy Sister   . Healthy Sister   . Prostate cancer Other        paternal grandmother's brother; dx in early 63s  . Breast cancer Neg Hx     Social History   Tobacco Use  . Smoking status: Former Smoker    Packs/day: 2.00    Years: 30.00    Pack years: 60.00    Types: Cigarettes    Quit date: 11/04/2013    Years since quitting: 7.1  . Smokeless tobacco: Never Used  Vaping Use  . Vaping Use: Never used  Substance Use Topics  . Alcohol use: No  . Drug use: No     Home Medications Prior to Admission medications   Medication Sig Start Date End Date Taking? Authorizing Provider  CARBOPLATIN IV Inject into the vein every 21 ( twenty-one) days. 07/05/20  Yes [provider]  OXYGEN Inhale 3 L into the lungs continuous.    Yes [provider]  PACLITAXEL IV Inject into the vein every 21 ( twenty-one) days. 07/05/20  Yes [provider]  pantoprazole (PROTONIX) 40 MG tablet Take 1 tablet (40 mg total) by mouth daily. 01/28/18  Yes Gladstone Lighter, MD  predniSONE (DELTASONE) 5 MG tablet Take 5 mg by mouth daily with breakfast.    Yes [provider]  prochlorperazine (COMPAZINE) 10 MG tablet Take 1 tablet (10 mg total) by mouth every 6 (six) hours as needed (Nausea or vomiting). 07/03/20  Yes Derek Jack, MD  simvastatin (ZOCOR) 40 MG tablet Take 40 mg by mouth at bedtime.   Yes [provider]  acetylcysteine (MUCOMYST) 20 % nebulizer solution Inhale into the lungs.  08/14/20 08/14/21  [provider]  albuterol (PROVENTIL HFA;VENTOLIN HFA) 108 (90 BASE) MCG/ACT inhaler Inhale 4-6 puffs into the lungs every 6 (six) hours as needed for wheezing or shortness of breath.     [provider]  amLODipine (NORVASC) 5 MG tablet Take 5 mg by mouth at bedtime.     [provider]  aspirin EC 81 MG tablet Take 81 mg by mouth daily.    [provider]  ferrous sulfate 325 (65 FE) MG tablet Take 325 mg by mouth every other day.    [provider]  Fluticasone-Umeclidin-Vilant (TRELEGY ELLIPTA) 100-62.5-25 MCG/INH AEPB Inhale into the lungs. 01/21/20   [provider]  gabapentin (NEURONTIN) 300 MG capsule Take 300 mg by mouth 3 (three) times daily.     [provider]  ipratropium (ATROVENT HFA) 17 MCG/ACT inhaler Inhale 2 puffs into the lungs every 6 (six) hours as needed for wheezing.     [provider]  ipratropium-albuterol (DUONEB) 0.5-2.5 (3)  MG/3ML SOLN Take 0.5 mg by nebulization every 6 (six) hours as needed. 08/03/12   [provider]  levalbuterol Penne Lash) 1.25 MG/3ML nebulizer solution Inhale into the lungs. 08/21/20   [provider]  levofloxacin (LEVAQUIN) 500 MG tablet Take by mouth. 12/14/20 12/24/20  [provider]  nortriptyline (PAMELOR) 25 MG capsule Take 25 mg by mouth at bedtime.    [provider]  ondansetron (ZOFRAN ODT) 4 MG disintegrating tablet 4mg  ODT q4 hours prn nausea/vomit Patient taking differently: Take 4 mg by mouth every 4 (four) hours as needed for nausea or vomiting. 07/08/20   Milton Ferguson, MD    Allergies    Patient has no  known allergies.  Review of Systems   Review of Systems  All other systems reviewed and are negative.   Physical Exam Updated Vital Signs BP 107/60   Pulse 99   Temp 99 F (37.2 C)   Resp 20   Ht 1.524 m (5')   Wt 53.5 kg   SpO2 99%   BMI 23.05 kg/m   Physical Exam Vitals and nursing note reviewed.  Constitutional:      General: She is in acute distress.     Appearance: She is well-developed and well-nourished.  HENT:     Head: Normocephalic and atraumatic.     Mouth/Throat:     Mouth: Oropharynx is clear and moist. Mucous membranes are dry.     Pharynx: No oropharyngeal exudate.  Eyes:     General: No scleral icterus.       Right eye: No discharge.        Left eye: No discharge.     Extraocular Movements: EOM normal.     Conjunctiva/sclera: Conjunctivae normal.     Pupils: Pupils are equal, round, and reactive to light.  Neck:     Thyroid: No thyromegaly.     Vascular: No JVD.  Cardiovascular:     Rate and Rhythm: Regular rhythm. Tachycardia present.     Pulses: Intact distal pulses.     Heart sounds: Normal heart sounds. No murmur heard. No friction rub. No gallop.   Pulmonary:     Effort: Pulmonary effort is normal. No respiratory distress.     Breath sounds: Wheezing, rhonchi and rales present.  Abdominal:      General: Bowel sounds are normal. There is no distension.     Palpations: Abdomen is soft. There is no mass.     Tenderness: There is no abdominal tenderness.  Musculoskeletal:        General: No tenderness or edema. Normal range of motion.     Cervical back: Normal range of motion and neck supple.     Comments: No tenderness over the 4 extremities or the cervical thoracic or lumbar spines  Lymphadenopathy:     Cervical: No cervical adenopathy.  Skin:    General: Skin is warm and dry.     Findings: No erythema or rash.  Neurological:     Mental Status: She is alert.     Coordination: Coordination normal.     Comments: Awake alert and able to follow commands, able to sit up in the bed, moves all 4 extremities with normal strength, normal sensation, normal coordination, normal cranial nerves III through XII  Psychiatric:        Mood and Affect: Mood and affect normal.        Behavior: Behavior normal.     ED Results / Procedures / Treatments   Labs (all labs ordered are listed, but only abnormal results are displayed) Labs Reviewed  COMPREHENSIVE METABOLIC PANEL - Abnormal; Notable for the following components:      Result Value   Sodium 128 (*)    Potassium 3.3 (*)    Chloride 87 (*)    Glucose, Bld 121 (*)    Calcium 8.6 (*)    Total Protein 6.4 (*)    Albumin 3.2 (*)    All other components within normal limits  CBC WITH DIFFERENTIAL/PLATELET - Abnormal; Notable for the following components:   WBC 15.9 (*)    RBC 2.60 (*)    Hemoglobin 8.7 (*)    HCT 27.3 (*)  MCV 105.0 (*)    Neutro Abs 15.3 (*)    Lymphs Abs 0.3 (*)    All other components within normal limits  CBG MONITORING, ED - Abnormal; Notable for the following components:   Glucose-Capillary 129 (*)    All other components within normal limits  SARS CORONAVIRUS 2 BY RT PCR (HOSPITAL ORDER, [facility])  CULTURE, BLOOD (ROUTINE X 2)  CULTURE, BLOOD (ROUTINE X 2)  URINE  CULTURE  LACTIC ACID, PLASMA  PROTIME-INR  APTT  LACTIC ACID, PLASMA  URINALYSIS, ROUTINE W REFLEX MICROSCOPIC  CBC  CREATININE, SERUM  BASIC METABOLIC PANEL  CBC WITH DIFFERENTIAL/PLATELET    EKG EKG Interpretation  Date/Time:  Sunday December 17 2020 19:06:24 EST Ventricular Rate:  102 PR Interval:    QRS Duration: 101 QT Interval:  337 QTC Calculation: 442 R Axis:   64 Text Interpretation: Sinus tachycardia Low voltage, extremity and precordial leads Nonspecific T abnrm, anterolateral leads ECG OTHERWISE WITHIN NORMAL LIMITS Confirmed by Noemi Chapel [phone]) on 12/17/2020 7:36:16 PM   Radiology CT Head Wo Contrast  Result Date: 12/17/2020 CLINICAL DATA:  Syncopal episode resulting in a fall, hitting the back of her head. EXAM: CT HEAD WITHOUT CONTRAST TECHNIQUE: Contiguous axial images were obtained from the base of the skull through the vertex without intravenous contrast. COMPARISON:  None. FINDINGS: Brain: Normal appearing cerebral hemispheres and posterior fossa structures. Normal size and position of the ventricles. No intracranial hemorrhage, mass lesion or CT evidence of acute infarction. Vascular: No hyperdense vessel or unexpected calcification. Skull: Normal. Negative for fracture or focal lesion. Sinuses/Orbits: Unremarkable. Other: None. IMPRESSION: Normal examination. Electronically Signed   By: Claudie Revering M.D.   On: 12/17/2020 19:04   DG Chest Port 1 View  Result Date: 12/17/2020 CLINICAL DATA:  Questionable sepsis - evaluate for abnormality Syncope fall out of bed. EXAM: PORTABLE CHEST 1 VIEW COMPARISON:  Chest radiograph 10/16/2020.  Chest CT 11/14/2020 FINDINGS: Left-sided chest port in place. New patchy opacity in the suprahilar left upper lobe. There are streaky opacities in both lung bases. Stable heart size and mediastinal contours. No pneumothorax or pleural effusion. Remote left sixth rib fracture. No acute osseous abnormalities are seen. IMPRESSION: Patchy  suprahilar left upper lobe opacity, new from CT last month, suspicious for pneumonia. Streaky bibasilar opacities, may be atelectasis or additional sites of pneumonia. Electronically Signed   By: Keith Rake M.D.   On: 12/17/2020 19:07    Procedures .Critical Care Performed by: Noemi Chapel, MD Authorized by: Noemi Chapel, MD   Critical care provider statement:    Critical care time (minutes):  35   Critical care time was exclusive of:  Separately billable procedures and treating other patients and teaching time   Critical care was necessary to treat or prevent imminent or life-threatening deterioration of the following conditions:  Sepsis   Critical care was time spent personally by me on the following activities:  Blood draw for specimens, development of treatment plan with patient or surrogate, discussions with consultants, evaluation of patient's response to treatment, examination of patient, obtaining history from patient or surrogate, ordering and performing treatments and interventions, ordering and review of laboratory studies, ordering and review of radiographic studies, pulse oximetry, re-evaluation of patient's condition and review of old charts     Medications Ordered in ED Medications  enoxaparin (LOVENOX) injection 40 mg (has no administration in time range)  0.9 %  sodium chloride infusion (has no administration in time range)  acetaminophen (TYLENOL) tablet 1,000 mg (1,000 mg Oral Given 12/17/20 2007)    ED Course  I have reviewed the triage vital signs and the nursing notes.  Pertinent labs & imaging results that were available during my care of the patient were reviewed by me and considered in my medical decision making (see chart for details).    MDM Rules/Calculators/A&P                          This patient presents with what appears to be possible sepsis, she is tachycardic, she is febrile tactile, she has a blood pressure of 761 systolic and a temperature  which I suspect is a fever, will check rectal.  Will get urinalysis, chest x-ray, IV fluids will be started, will wait on lactic acid for hypotension before 30 cc/kg bolus.  She will likely benefit from albuterol treatment, community-acquired pneumonia antibiotics will be added, CT scan of the brain due to the prolonged unconscious state.  Pt has had no more loss of consciousness here, she does have some hyponatremia, a white blood cell count that is elevated in the leukocytosis in conjunction with the chest x-ray which does show a community-acquired pneumonia is suspicious for early sepsis.  Her rectal temperature was 99 degrees, her heart rate is around 105, she is requiring oxygen as per her normal.  I called the hospitalist to discuss, Dr. Shanon Brow will accept this patient in consultation  Final Clinical Impression(s) / ED Diagnoses Final diagnoses:  Sepsis, due to unspecified organism, unspecified whether acute organ dysfunction present [facility])      Noemi Chapel, MD 12/17/20 2014

## 2020-12-18 ENCOUNTER — Inpatient Hospital Stay (HOSPITAL_COMMUNITY): Payer: Medicare HMO

## 2020-12-18 ENCOUNTER — Inpatient Hospital Stay (HOSPITAL_COMMUNITY)
Admit: 2020-12-18 | Discharge: 2020-12-18 | Disposition: A | Discharge status: Home / Self Care | Payer: Medicare HMO | Attending: Internal Medicine | Admitting: Internal Medicine

## 2020-12-18 DIAGNOSIS — R55 Syncope and collapse: Secondary | ICD-10-CM | POA: Diagnosis not present

## 2020-12-18 DIAGNOSIS — J181 Lobar pneumonia, unspecified organism: Secondary | ICD-10-CM

## 2020-12-18 DIAGNOSIS — C786 Secondary malignant neoplasm of retroperitoneum and peritoneum: Secondary | ICD-10-CM

## 2020-12-18 DIAGNOSIS — A419 Sepsis, unspecified organism: Principal | ICD-10-CM

## 2020-12-18 DIAGNOSIS — J9611 Chronic respiratory failure with hypoxia: Secondary | ICD-10-CM

## 2020-12-18 LAB — CBC WITH DIFFERENTIAL/PLATELET
Abs Immature Granulocytes: 0.77 10*3/uL — ABNORMAL HIGH (ref 0.00–0.07)
Basophils Absolute: 0 10*3/uL (ref 0.0–0.1)
Basophils Relative: 0 %
Eosinophils Absolute: 0 10*3/uL (ref 0.0–0.5)
Eosinophils Relative: 0 %
HCT: 24.6 % — ABNORMAL LOW (ref 36.0–46.0)
Hemoglobin: 7.8 g/dL — ABNORMAL LOW (ref 12.0–15.0)
Immature Granulocytes: 8 %
Lymphocytes Relative: 4 %
Lymphs Abs: 0.4 10*3/uL — ABNORMAL LOW (ref 0.7–4.0)
MCH: 33.5 pg (ref 26.0–34.0)
MCHC: 31.7 g/dL (ref 30.0–36.0)
MCV: 105.6 fL — ABNORMAL HIGH (ref 80.0–100.0)
Monocytes Absolute: 0.4 10*3/uL (ref 0.1–1.0)
Monocytes Relative: 4 %
Neutro Abs: 8.1 10*3/uL — ABNORMAL HIGH (ref 1.7–7.7)
Neutrophils Relative %: 84 %
Platelets: 137 10*3/uL — ABNORMAL LOW (ref 150–400)
RBC: 2.33 MIL/uL — ABNORMAL LOW (ref 3.87–5.11)
RDW: 14.8 % (ref 11.5–15.5)
WBC: 9.7 10*3/uL (ref 4.0–10.5)
nRBC: 0 % (ref 0.0–0.2)

## 2020-12-18 LAB — BASIC METABOLIC PANEL
Anion gap: 9 (ref 5–15)
BUN: 7 mg/dL — ABNORMAL LOW (ref 8–23)
CO2: 33 mmol/L — ABNORMAL HIGH (ref 22–32)
Calcium: 8.2 mg/dL — ABNORMAL LOW (ref 8.9–10.3)
Chloride: 93 mmol/L — ABNORMAL LOW (ref 98–111)
Creatinine, Ser: 0.38 mg/dL — ABNORMAL LOW (ref 0.44–1.00)
GFR, Estimated: >60 mL/min (ref >60)
Glucose, Bld: 106 mg/dL — ABNORMAL HIGH (ref 70–99)
Potassium: 3.1 mmol/L — ABNORMAL LOW (ref 3.5–5.1)
Sodium: 135 mmol/L (ref 135–145)

## 2020-12-18 LAB — ECHOCARDIOGRAM COMPLETE
Area-P 1/2: 3.83 cm2
Height: 60 in
S' Lateral: 2.4 cm
Weight: 1888 oz

## 2020-12-18 LAB — CBG MONITORING, ED: Glucose-Capillary: 90 mg/dL (ref 70–99)

## 2020-12-18 LAB — PROCALCITONIN: Procalcitonin: 0.5 ng/mL

## 2020-12-18 LAB — D-DIMER, QUANTITATIVE: D-Dimer, Quant: 2.26 ug/mL-FEU — ABNORMAL HIGH (ref 0.00–0.50)

## 2020-12-18 MED ORDER — METHYLPREDNISOLONE SODIUM SUCC 40 MG IJ SOLR
40.0000 mg | Freq: Every day | INTRAMUSCULAR | Status: DC
  Administered 2020-12-18 – 2020-12-20 (×3): 40 mg via INTRAVENOUS
  Filled 2020-12-18 (×3): qty 1

## 2020-12-18 MED ORDER — PANTOPRAZOLE SODIUM 40 MG PO TBEC
40.0000 mg | DELAYED_RELEASE_TABLET | Freq: Every day | ORAL | Status: DC
  Administered 2020-12-18 – 2020-12-20 (×3): 40 mg via ORAL
  Filled 2020-12-18 (×7): qty 1

## 2020-12-18 MED ORDER — NORTRIPTYLINE HCL 25 MG PO CAPS
25.0000 mg | ORAL_CAPSULE | Freq: Every day | ORAL | Status: DC
  Administered 2020-12-18 – 2020-12-19 (×2): 25 mg via ORAL
  Filled 2020-12-18 (×6): qty 1

## 2020-12-18 MED ORDER — IPRATROPIUM-ALBUTEROL 0.5-2.5 (3) MG/3ML IN SOLN
3.0000 mL | Freq: Three times a day (TID) | RESPIRATORY_TRACT | Status: DC
  Administered 2020-12-18 (×3): 3 mL via RESPIRATORY_TRACT
  Filled 2020-12-18 (×2): qty 3

## 2020-12-18 MED ORDER — ASPIRIN EC 81 MG PO TBEC
81.0000 mg | DELAYED_RELEASE_TABLET | Freq: Every day | ORAL | Status: DC
  Administered 2020-12-18 – 2020-12-20 (×3): 81 mg via ORAL
  Filled 2020-12-18 (×7): qty 1

## 2020-12-18 MED ORDER — IOHEXOL 350 MG/ML SOLN
75.0000 mL | Freq: Once | INTRAVENOUS | Status: AC | PRN
  Administered 2020-12-18: 75 mL via INTRAVENOUS

## 2020-12-18 MED ORDER — KETOROLAC TROMETHAMINE 15 MG/ML IJ SOLN
15.0000 mg | Freq: Once | INTRAMUSCULAR | Status: AC
  Administered 2020-12-18: 15 mg via INTRAVENOUS
  Filled 2020-12-18: qty 1

## 2020-12-18 MED ORDER — ACETYLCYSTEINE 20 % IN SOLN
2.0000 mL | Freq: Two times a day (BID) | RESPIRATORY_TRACT | Status: DC
  Administered 2020-12-18 – 2020-12-20 (×5): 2 mL via RESPIRATORY_TRACT
  Filled 2020-12-18 (×12): qty 4

## 2020-12-18 MED ORDER — ACETYLCYSTEINE 10% NICU INHALATION SOLUTION
2.0000 mL | Freq: Two times a day (BID) | RESPIRATORY_TRACT | Status: DC
  Filled 2020-12-18 (×5): qty 2

## 2020-12-18 MED ORDER — IPRATROPIUM-ALBUTEROL 0.5-2.5 (3) MG/3ML IN SOLN
3.0000 mL | Freq: Three times a day (TID) | RESPIRATORY_TRACT | Status: DC
  Administered 2020-12-19 – 2020-12-20 (×5): 3 mL via RESPIRATORY_TRACT
  Filled 2020-12-18 (×6): qty 3

## 2020-12-18 NOTE — Progress Notes (Signed)
*  PRELIMINARY RESULTS* Echocardiogram 2D Echocardiogram has been performed.  Leavy Cella 12/18/2020, 10:17 AM

## 2020-12-18 NOTE — Progress Notes (Signed)
PROGRESS NOTE  [name] [id number] DOB: [birth year] DOA: 12/17/2020 PCP: [name], MD  Brief History:  65 year old female with a history of serous ovarian carcinoma, COPD, chronic respiratory failure on 3 L, peritoneal carcinomatosis, hypertension, hyperlipidemia presenting with fevers, chills, and an episode of loss of consciousness.  The patient was lying in bed watching television when she " felt strange and woozy" prior to her episode of loss of consciousness.  Apparently, the patient rolled off the bed and her boyfriend heard her fall off the bed and came to her assistance.  He stated that the patient was not responsive for about 8 minutes.  He placed her back in bed at which time the patient work-up.  She did not appear to have a postictal state.  She was awake and alert without any incontinence or biting her tongue.  Notably, the patient stated that prior to the episode of loss of consciousness she had been more short of breath and asked for a breathing treatment to be prepared by her boyfriend.  Her loss of consciousness actually occurred during her breathing treatment. The patient last received chemotherapy on 12/13/2020 for her ovarian carcinoma.  She began developing fevers on 12/15/2020 up to 101.2 F.  She received her Neulasta injection on 12/15/2020.  She had been complaining of fevers, coughing, shortness of breath for that period of time.  She contacted her pulmonologist, Dr. Raul Del.  A prescription for levofloxacin was called in on 12/16/2020 which she began taking.  The patient denies any hemoptysis, nausea, vomiting or diarrhea, abdominal pain, dysuria, hematuria.  She denies any other new medications. In the emergency department, the patient was back to her usual baseline.  She had low-grade temperature 99.0 F with tachycardia up to 114.  Blood pressure was soft with systolic blood pressures in the 90s.  She was saturating 96-100% on 3 L.  WBC 15.9, hemoglobin 8.7,  platelets 169,000.  BMP showed sodium 128, potassium 3.3, and serum creatinine 0.47.  Chest x-ray showed left upper lobe streaky opacity and left lower lobe greater than right lower lobe opacity.  The patient was started on vancomycin and cefepime.  Assessment/Plan: Sepsis -Patient presented with fevers, tachycardia, and leukocytosis -Secondary to pneumonia, but concerned about bacteremia -Lactic acid peaked 1.1 -Check PCT -Follow blood cultures -Urinalysis negative for pyuria  Lobar pneumonia -Personally reviewed chest x-ray left upper lobe opacity, streaky opacities bilateral lower lobes -Continue vancomycin and cefepime pending culture data -MRSA screen  Loss of consciousness -suspect low BP -levoflox can lower seizure threshold -EEG -echo -remain on tele -D-dimer -orthostatics  Chronic respiratory failure with hypoxia -Chronically on 3 L nasal cannula  COPD -Patient is on chronic Mucomyst and albuterol nebulizers--restart -She is on baseline prednisone 5 mg daily -Start stress steroids  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 -Last chemotherapy 12/13/2020 -She received Neulasta 12/15/2020  Hyponatremia -Secondary to volume depletion -Continue IV fluids  Hypertension -Holding amlodipine secondary to soft blood pressures  Hyperlipidemia -restart statin      Status is: Inpatient  Remains inpatient appropriate because:IV treatments appropriate due to intensity of illness or inability to take PO   Dispo: The patient is from: Home              Anticipated d/c is to: Home              Anticipated d/c date is: 2 days  Patient currently is not medically stable to d/c.   Difficult to place patient No        Family Communication:  no Family at bedside  Consultants:  none  Code Status:  FULL   DVT Prophylaxis:   [place] Lovenox   Procedures: As Listed in Progress Note  Above  Antibiotics: None      Subjective:   Objective: Vitals:   12/18/20 0330 12/18/20 0442 12/18/20 0530 12/18/20 0630  BP: (!) 99/48 111/72 105/61 121/68  Pulse: 92 90 87 86  Resp: (!) 21 18 17  (!) 21  Temp:      TempSrc:      SpO2: 93% 100% 96% 98%  Weight:      Height:        Intake/Output Summary (Last 24 hours) at 12/18/2020 0729 Last data filed at 12/18/2020 0218 Gross per 24 hour  Intake --  Output 900 ml  Net -900 ml   Weight change:  Exam:   General:  Pt is alert, follows commands appropriately, not in acute distress  HEENT: No icterus, No thrush, No neck mass, [place]/AT  Cardiovascular: RRR, S1/S2, no rubs, no gallops  Respiratory: CTA bilaterally, no wheezing, no crackles, no rhonchi  Abdomen: Soft/+BS, non tender, non distended, no guarding  Extremities: No edema, No lymphangitis, No petechiae, No rashes, no synovitis   Data Reviewed: I have personally reviewed following labs and imaging studies Basic Metabolic Panel: Recent Labs  Lab 12/13/20 0846 12/17/20 1904 12/17/20 2117 12/18/20 0433  NA 139 128*  --  135  K 3.6 3.3*  --  3.1*  CL 102 87*  --  93*  CO2 30 31  --  33*  GLUCOSE 99 121*  --  106*  BUN 12 12  --  7*  CREATININE 0.54 0.47 0.44 0.38*  CALCIUM 8.9 8.6*  --  8.2*  MG 1.7  --   --   --    Liver Function Tests: Recent Labs  Lab 12/13/20 0846 12/17/20 1904  AST 16 16  ALT 10 8  ALKPHOS 67 93  BILITOT 0.4 0.3  PROT 6.5 6.4*  ALBUMIN 3.9 3.2*   No results for input(s): LIPASE, AMYLASE in the last 168 hours. No results for input(s): AMMONIA in the last 168 hours. Coagulation Profile: Recent Labs  Lab 12/17/20 1904  INR 1.0   CBC: Recent Labs  Lab 12/13/20 0846 12/17/20 1904 12/17/20 2117 12/18/20 0433  WBC 3.8* 15.9* 14.2* 9.7  NEUTROABS 2.0 15.3*  --  8.1*  HGB 9.4* 8.7* 7.8* 7.8*  HCT 29.7* 27.3* 24.4* 24.6*  MCV 104.2* 105.0* 104.3* 105.6*  PLT 208 169 143* 137*   Cardiac Enzymes: No results for  input(s): CKTOTAL, CKMB, CKMBINDEX, TROPONINI in the last 168 hours. BNP: Invalid input(s): POCBNP CBG: Recent Labs  Lab 12/17/20 1755  GLUCAP 129*   HbA1C: No results for input(s): HGBA1C in the last 72 hours. Urine analysis:    Component Value Date/Time   COLORURINE YELLOW 12/17/2020 [place] 12/17/2020 2200   APPEARANCEUR Clear 10/08/2013 2007   LABSPEC 1.009 12/17/2020 2200   LABSPEC 1.003 10/08/2013 2007   PHURINE 6.0 12/17/2020 2200   GLUCOSEU NEGATIVE 12/17/2020 2200   GLUCOSEU Negative 10/08/2013 2007   HGBUR NEGATIVE 12/17/2020 2200   BILIRUBINUR NEGATIVE 12/17/2020 2200   BILIRUBINUR Negative 10/08/2013 2007   KETONESUR NEGATIVE 12/17/2020 2200   PROTEINUR NEGATIVE 12/17/2020 2200   NITRITE NEGATIVE 12/17/2020 [place] 12/17/2020 2200  LEUKOCYTESUR Negative 10/08/2013 2007   Sepsis Labs: @LABRCNTIP (procalcitonin:4,lacticidven:4) ) Recent Results (from the past 240 hour(s))  Blood Culture (routine x 2)     Status: None (Preliminary result)   Collection Time: 12/17/20  7:04 PM   Specimen: BLOOD RIGHT FOREARM  Result Value Ref Range Status   Specimen Description BLOOD RIGHT FOREARM  Final   Special Requests   Final    BOTTLES DRAWN AEROBIC AND ANAEROBIC Blood Culture adequate volume   Culture   Final    NO GROWTH < 12 HOURS Performed at [facility], [address] [place], [place] [address]    Report Status PENDING  Incomplete  SARS Coronavirus 2 by RT PCR (hospital order, performed in [place] hospital lab) Nasopharyngeal Nasopharyngeal Swab     Status: None   Collection Time: 12/17/20  7:05 PM   Specimen: Nasopharyngeal Swab  Result Value Ref Range Status   SARS Coronavirus 2 NEGATIVE NEGATIVE Final    Comment: (NOTE) SARS-CoV-2 target nucleic acids are NOT DETECTED.  The SARS-CoV-2 RNA is generally detectable in upper and lower respiratory specimens during the acute phase of infection. The  lowest concentration of SARS-CoV-2 viral copies this assay can detect is 250 copies / mL. A negative result does not preclude SARS-CoV-2 infection and should not be used as the sole basis for treatment or other patient management decisions.  A negative result may occur with improper specimen collection / handling, submission of specimen other than nasopharyngeal swab, presence of viral mutation(s) within the areas targeted by this assay, and inadequate number of viral copies (<250 copies / mL). A negative result must be combined with clinical observations, patient history, and epidemiological information.  Fact Sheet for Patients:   StrictlyIdeas.no  Fact Sheet for Healthcare Providers: BankingDealers.co.za  This test is not yet approved or  cleared by the [place] FDA and has been authorized for detection and/or diagnosis of SARS-CoV-2 by FDA under an Emergency Use Authorization (EUA).  This EUA will remain in effect (meaning this test can be used) for the duration of the COVID-19 declaration under Section 564(b)(1) of the Act, 21 U.S.C. section 360bbb-3(b)(1), unless the authorization is terminated or revoked sooner.  Performed at [facility], [address], [place], [place] [address]   Blood Culture (routine x 2)     Status: None (Preliminary result)   Collection Time: 12/17/20  7:05 PM   Specimen: BLOOD  Result Value Ref Range Status   Specimen Description BLOOD BLOOD LEFT WRIST  Final   Special Requests   Final    BOTTLES DRAWN AEROBIC AND ANAEROBIC Blood Culture results may not be optimal due to an excessive volume of blood received in culture bottles   Culture   Final    NO GROWTH < 12 HOURS Performed at [facility], [address], [place], [place] [address]    Report Status PENDING  Incomplete     Scheduled Meds: . enoxaparin (LOVENOX) injection  40 mg Subcutaneous Q24H   Continuous Infusions: . sodium chloride  100 mL/hr at 12/18/20 0436  . ceFEPime (MAXIPIME) IV Stopped (12/18/20 0311)  . vancomycin      Procedures/Studies: CT Head Wo Contrast  Result Date: 12/17/2020 CLINICAL DATA:  Syncopal episode resulting in a fall, hitting the back of her head. EXAM: CT HEAD WITHOUT CONTRAST TECHNIQUE: Contiguous axial images were obtained from the base of the skull through the vertex without intravenous contrast. COMPARISON:  None. FINDINGS: Brain: Normal appearing cerebral hemispheres and posterior fossa structures.  Normal size and position of the ventricles. No intracranial hemorrhage, mass lesion or CT evidence of acute infarction. Vascular: No hyperdense vessel or unexpected calcification. Skull: Normal. Negative for fracture or focal lesion. Sinuses/Orbits: Unremarkable. Other: None. IMPRESSION: Normal examination. Electronically Signed   By: Claudie Revering M.D.   On: 12/17/2020 19:04   DG Chest Port 1 View  Result Date: 12/17/2020 CLINICAL DATA:  Questionable sepsis - evaluate for abnormality Syncope fall out of bed. EXAM: PORTABLE CHEST 1 VIEW COMPARISON:  Chest radiograph 10/16/2020.  Chest CT 11/14/2020 FINDINGS: Left-sided chest port in place. New patchy opacity in the suprahilar left upper lobe. There are streaky opacities in both lung bases. Stable heart size and mediastinal contours. No pneumothorax or pleural effusion. Remote left sixth rib fracture. No acute osseous abnormalities are seen. IMPRESSION: Patchy suprahilar left upper lobe opacity, new from CT last month, suspicious for pneumonia. Streaky bibasilar opacities, may be atelectasis or additional sites of pneumonia. Electronically Signed   By: Keith Rake M.D.   On: 12/17/2020 19:07    Orson Eva, DO  [facility]  If 7PM-7AM, please contact night-coverage [email or web address] Password [email or web address] 12/18/2020, 7:29 AM   LOS: 1 day

## 2020-12-18 NOTE — Progress Notes (Signed)
Patient Sats 85% on 3L [place].  Oxygen increased to 5L.  Sats now 90-91% while patient is at rest.

## 2020-12-18 NOTE — Progress Notes (Signed)
EEG Completed; Results Pending  

## 2020-12-19 DIAGNOSIS — R55 Syncope and collapse: Secondary | ICD-10-CM

## 2020-12-19 LAB — CBC
HCT: 27.1 % — ABNORMAL LOW (ref 36.0–46.0)
Hemoglobin: 8.9 g/dL — ABNORMAL LOW (ref 12.0–15.0)
MCH: 33.5 pg (ref 26.0–34.0)
MCHC: 32.8 g/dL (ref 30.0–36.0)
MCV: 101.9 fL — ABNORMAL HIGH (ref 80.0–100.0)
Platelets: 133 10*3/uL — ABNORMAL LOW (ref 150–400)
RBC: 2.66 MIL/uL — ABNORMAL LOW (ref 3.87–5.11)
RDW: 14.6 % (ref 11.5–15.5)
WBC: 8.2 10*3/uL (ref 4.0–10.5)
nRBC: 0 % (ref 0.0–0.2)

## 2020-12-19 LAB — URINE CULTURE: Culture: NO GROWTH

## 2020-12-19 LAB — COMPREHENSIVE METABOLIC PANEL
ALT: 8 U/L (ref 0–44)
AST: 16 U/L (ref 15–41)
Albumin: 3.1 g/dL — ABNORMAL LOW (ref 3.5–5.0)
Alkaline Phosphatase: 86 U/L (ref 38–126)
Anion gap: 10 (ref 5–15)
BUN: 9 mg/dL (ref 8–23)
CO2: 33 mmol/L — ABNORMAL HIGH (ref 22–32)
Calcium: 8.6 mg/dL — ABNORMAL LOW (ref 8.9–10.3)
Chloride: 91 mmol/L — ABNORMAL LOW (ref 98–111)
Creatinine, Ser: 0.45 mg/dL (ref 0.44–1.00)
GFR, Estimated: >60 mL/min (ref >60)
Glucose, Bld: 109 mg/dL — ABNORMAL HIGH (ref 70–99)
Potassium: 3 mmol/L — ABNORMAL LOW (ref 3.5–5.1)
Sodium: 134 mmol/L — ABNORMAL LOW (ref 135–145)
Total Bilirubin: 0.4 mg/dL (ref 0.3–1.2)
Total Protein: 6.8 g/dL (ref 6.5–8.1)

## 2020-12-19 LAB — MRSA PCR SCREENING: MRSA by PCR: NEGATIVE

## 2020-12-19 LAB — MAGNESIUM: Magnesium: 1.3 mg/dL — ABNORMAL LOW (ref 1.7–2.4)

## 2020-12-19 MED ORDER — MAGNESIUM SULFATE 2 GM/50ML IV SOLN
2.0000 g | Freq: Once | INTRAVENOUS | Status: AC
  Administered 2020-12-19: 2 g via INTRAVENOUS
  Filled 2020-12-19: qty 50

## 2020-12-19 MED ORDER — AZITHROMYCIN 500 MG PO TABS
500.0000 mg | ORAL_TABLET | Freq: Every day | ORAL | Status: DC
  Administered 2020-12-19: 500 mg via ORAL
  Filled 2020-12-19 (×4): qty 1

## 2020-12-19 MED ORDER — POTASSIUM CHLORIDE CRYS ER 20 MEQ PO TBCR
40.0000 meq | EXTENDED_RELEASE_TABLET | Freq: Once | ORAL | Status: AC
  Administered 2020-12-19: 40 meq via ORAL
  Filled 2020-12-19: qty 2

## 2020-12-19 MED ORDER — ACETAMINOPHEN 325 MG PO TABS
650.0000 mg | ORAL_TABLET | Freq: Four times a day (QID) | ORAL | Status: DC | PRN
  Administered 2020-12-19 – 2020-12-20 (×3): 650 mg via ORAL
  Filled 2020-12-19 (×3): qty 2

## 2020-12-19 NOTE — Progress Notes (Signed)
Patient asking if son could come back to see her. Assured her he could. Son Eduard Clos arrived to main entrance walked him to pre op room 2 to see patient. Patient happy to see him, but wants him to "take her out of here" told her she cannot go outside with him, but he can stay and visit as long as she wants

## 2020-12-19 NOTE — Progress Notes (Addendum)
PROGRESS NOTE  [name] [id number] DOB: 08-24-1956 DOA: 12/17/2020 PCP: [name], MD  Brief History:  65 year old female with a history of serous ovarian carcinoma, COPD, chronic respiratory failure on 3 L, peritoneal carcinomatosis, hypertension, hyperlipidemia presenting with fevers, chills, and an episode of loss of consciousness.  The patient was lying in bed watching television when she " felt strange and woozy" prior to her episode of loss of consciousness.  Apparently, the patient rolled off the bed and her boyfriend heard her fall off the bed and came to her assistance.  He stated that the patient was not responsive for about 8 minutes.  He placed her back in bed at which time the patient work-up.  She did not appear to have a postictal state.  She was awake and alert without any incontinence or biting her tongue.  Notably, the patient stated that prior to the episode of loss of consciousness she had been more short of breath and asked for a breathing treatment to be prepared by her boyfriend.  Her loss of consciousness actually occurred during her breathing treatment. The patient last received chemotherapy on 12/13/2020 for her ovarian carcinoma.  She began developing fevers on 12/15/2020 up to 101.2 F.  She received her Neulasta injection on 12/15/2020.  She had been complaining of fevers, coughing, shortness of breath for that period of time.  She contacted her pulmonologist, Dr. Raul Del.  A prescription for levofloxacin was called in on 12/16/2020 which she began taking.  The patient denies any hemoptysis, nausea, vomiting or diarrhea, abdominal pain, dysuria, hematuria.  She denies any other new medications. In the emergency department, the patient was back to her usual baseline.  She had low-grade temperature 99.0 F with tachycardia up to 114.  Blood pressure was soft with systolic blood pressures in the 90s.  She was saturating 96-100% on 3 L.  WBC 15.9, hemoglobin 8.7,  platelets 169,000.  BMP showed sodium 128, potassium 3.3, and serum creatinine 0.47.  Chest x-ray showed left upper lobe streaky opacity and left lower lobe greater than right lower lobe opacity.  The patient was started on vancomycin and cefepime.   Assessment/Plan: Sepsis -Patient presented with fevers, tachycardia, and leukocytosis -Secondary to pneumonia -Lactic acid peaked 1.1 -Check PCT--0.50 -Follow blood cultures--neg -Urinalysis negative for pyuria  Lobar pneumonia -Personally reviewed chest x-ray left upper lobe opacity, streaky opacities bilateral lower lobes -Continue vancomycin and cefepime pending culture data -MRSA screen--neg-->d/c vanco -CTA chest--neg PE, new multifocal ill-defined nodular airspace opacities LUL -continue cefepime -add azithro  Loss of consciousness -suspect low BP -levoflox can lower seizure threshold -EEG--neg for seizure -echo--EF 60-65%, no WMA, trivival MR/TR -remain on tele -D-dimer--2.26 -orthostatics  Chronic respiratory failure with hypoxia -Chronically on 3 L nasal cannula  COPD -Patient is on chronic Mucomyst and albuterol nebulizers--restart -She is on baseline prednisone 5 mg daily -Start stress steroids  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 -Last chemotherapy 12/13/2020 -She received Neulasta 12/15/2020  Hyponatremia -Secondary to volume depletion -Continue IV fluids  Hypertension -Holding amlodipine secondary to soft blood pressures  Hyperlipidemia -restart statin  Hypomagnesemia/hypokalemia -replete    Status is: Inpatient  Remains inpatient appropriate because:IV treatments appropriate due to intensity of illness or inability to take PO   Dispo: The patient is from: Home              Anticipated d/c is to: Home  Anticipated d/c date is: 2 days              Patient currently is not medically stable to d/c.   Difficult to  place patient No        Family Communication:   No Family at bedside  Consultants:  none  Code Status:  FULL  DVT Prophylaxis:  [place] Lovenox   Procedures: As Listed in Progress Note Above  Antibiotics: vanco 1/31>>2/1 Cefepime 1/31>> azithro 2/1>>    Subjective: Patient denies fevers, chills, headache, chest pain, dyspnea, nausea, vomiting, diarrhea, abdominal pain, dysuria, hematuria, hematochezia, and melena. She is breathing a little better  Objective: Vitals:   12/19/20 0800 12/19/20 1100 12/19/20 1359 12/19/20 1400  BP: (!) 155/81   (!) 160/75  Pulse: (!) 113 (!) 118  (!) 103  Resp: 19 (!) 28    Temp: 97.7 F (36.5 C)  99 F (37.2 C)   TempSrc: Oral     SpO2: 93% 90% 98% 98%  Weight:      Height:        Intake/Output Summary (Last 24 hours) at 12/19/2020 1614 Last data filed at 12/19/2020 1200 Gross per 24 hour  Intake 612 ml  Output 150 ml  Net 462 ml   Weight change:  Exam:   General:  Pt is alert, follows commands appropriately, not in acute distress  HEENT: No icterus, No thrush, No neck mass, [place]/AT  Cardiovascular: RRR, S1/S2, no rubs, no gallops  Respiratory: diminished BS,  Bibasilar rales. No wheeze  Abdomen: Soft/+BS, non tender, non distended, no guarding  Extremities: No edema, No lymphangitis, No petechiae, No rashes, no synovitis   Data Reviewed: I have personally reviewed following labs and imaging studies Basic Metabolic Panel: Recent Labs  Lab 12/13/20 0846 12/17/20 1904 12/17/20 2117 12/18/20 0433 12/19/20 0447  NA 139 128*  --  135 134*  K 3.6 3.3*  --  3.1* 3.0*  CL 102 87*  --  93* 91*  CO2 30 31  --  33* 33*  GLUCOSE 99 121*  --  106* 109*  BUN 12 12  --  7* 9  CREATININE 0.54 0.47 0.44 0.38* 0.45  CALCIUM 8.9 8.6*  --  8.2* 8.6*  MG 1.7  --   --   --  1.3*   Liver Function Tests: Recent Labs  Lab 12/13/20 0846 12/17/20 1904 12/19/20 0447  AST 16 16 16   ALT 10 8 8   ALKPHOS 67 93 86  BILITOT 0.4 0.3  0.4  PROT 6.5 6.4* 6.8  ALBUMIN 3.9 3.2* 3.1*   No results for input(s): LIPASE, AMYLASE in the last 168 hours. No results for input(s): AMMONIA in the last 168 hours. Coagulation Profile: Recent Labs  Lab 12/17/20 1904  INR 1.0   CBC: Recent Labs  Lab 12/13/20 0846 12/17/20 1904 12/17/20 2117 12/18/20 0433 12/19/20 0447  WBC 3.8* 15.9* 14.2* 9.7 8.2  NEUTROABS 2.0 15.3*  --  8.1*  --   HGB 9.4* 8.7* 7.8* 7.8* 8.9*  HCT 29.7* 27.3* 24.4* 24.6* 27.1*  MCV 104.2* 105.0* 104.3* 105.6* 101.9*  PLT 208 169 143* 137* 133*   Cardiac Enzymes: No results for input(s): CKTOTAL, CKMB, CKMBINDEX, TROPONINI in the last 168 hours. BNP: Invalid input(s): POCBNP CBG: Recent Labs  Lab 12/17/20 1755 12/18/20 0949  GLUCAP 129* 90   HbA1C: No results for input(s): HGBA1C in the last 72 hours. Urine analysis:    Component Value Date/Time   COLORURINE YELLOW 12/17/2020  [place] 12/17/2020 [place] 10/08/2013 2007   LABSPEC 1.009 12/17/2020 2200   LABSPEC 1.003 10/08/2013 2007   PHURINE 6.0 12/17/2020 2200   GLUCOSEU NEGATIVE 12/17/2020 2200   GLUCOSEU Negative 10/08/2013 2007   HGBUR NEGATIVE 12/17/2020 [place] 12/17/2020 2200   BILIRUBINUR Negative 10/08/2013 2007   KETONESUR NEGATIVE 12/17/2020 2200   PROTEINUR NEGATIVE 12/17/2020 2200   NITRITE NEGATIVE 12/17/2020 [place] 12/17/2020 2200   LEUKOCYTESUR Negative 10/08/2013 2007   Sepsis Labs: @LABRCNTIP (procalcitonin:4,lacticidven:4) ) Recent Results (from the past 240 hour(s))  Blood Culture (routine x 2)     Status: None (Preliminary result)   Collection Time: 12/17/20  7:04 PM   Specimen: BLOOD RIGHT FOREARM  Result Value Ref Range Status   Specimen Description BLOOD RIGHT FOREARM  Final   Special Requests   Final    BOTTLES DRAWN AEROBIC AND ANAEROBIC Blood Culture adequate volume   Culture   Final    NO GROWTH 2 DAYS Performed at [facility], [address] [place], [place] [address]    Report Status PENDING  Incomplete  SARS Coronavirus 2 by RT PCR (hospital order, performed in [place] hospital lab) Nasopharyngeal Nasopharyngeal Swab     Status: None   Collection Time: 12/17/20  7:05 PM   Specimen: Nasopharyngeal Swab  Result Value Ref Range Status   SARS Coronavirus 2 NEGATIVE NEGATIVE Final    Comment: (NOTE) SARS-CoV-2 target nucleic acids are NOT DETECTED.  The SARS-CoV-2 RNA is generally detectable in upper and lower respiratory specimens during the acute phase of infection. The lowest concentration of SARS-CoV-2 viral copies this assay can detect is 250 copies / mL. A negative result does not preclude SARS-CoV-2 infection and should not be used as the sole basis for treatment or other patient management decisions.  A negative result may occur with improper specimen collection / handling, submission of specimen other than nasopharyngeal swab, presence of viral mutation(s) within the areas targeted by this assay, and inadequate number of viral copies (<250 copies / mL). A negative result must be combined with clinical observations, patient history, and epidemiological information.  Fact Sheet for Patients:   StrictlyIdeas.no  Fact Sheet for Healthcare Providers: BankingDealers.co.za  This test is not yet approved or  cleared by the [place] FDA and has been authorized for detection and/or diagnosis of SARS-CoV-2 by FDA under an Emergency Use Authorization (EUA).  This EUA will remain in effect (meaning this test can be used) for the duration of the COVID-19 declaration under Section 564(b)(1) of the Act, 21 U.S.C. section 360bbb-3(b)(1), unless the authorization is terminated or revoked sooner.  Performed at [facility], [address] [place], [place] [address]   Blood Culture (routine x 2)     Status: None (Preliminary result)   Collection Time:  12/17/20  7:05 PM   Specimen: BLOOD  Result Value Ref Range Status   Specimen Description BLOOD BLOOD LEFT WRIST  Final   Special Requests   Final    BOTTLES DRAWN AEROBIC AND ANAEROBIC Blood Culture results may not be optimal due to an excessive volume of blood received in culture bottles   Culture   Final    NO GROWTH 2 DAYS Performed at [facility], [address], [place], [place] [address]    Report Status PENDING  Incomplete  Urine culture     Status: None   Collection Time: 12/17/20 10:00 PM  Specimen: Urine, Clean Catch  Result Value Ref Range Status   Specimen Description   Final    URINE, CLEAN CATCH Performed at [facility], [address] [place], [place] [address]    Special Requests   Final    NONE Performed at [facility], [address] [place], [place] [address]    Culture   Final    NO GROWTH Performed at [facility], [place] [address], [place], [place] [address]    Report Status 12/19/2020 FINAL  Final  MRSA PCR Screening     Status: None   Collection Time: 12/19/20  2:03 PM   Specimen: Nasopharyngeal  Result Value Ref Range Status   MRSA by PCR NEGATIVE NEGATIVE Final    Comment:        The GeneXpert MRSA Assay (FDA approved for NASAL specimens only), is one component of a comprehensive MRSA colonization surveillance program. It is not intended to diagnose MRSA infection nor to guide or monitor treatment for MRSA infections. Performed at [facility], [address] [place], [place] [address]      Scheduled Meds: . acetylcysteine  2 mL Nebulization BID  . aspirin EC  81 mg Oral Daily  . enoxaparin (LOVENOX) injection  40 mg Subcutaneous Q24H  . ipratropium-albuterol  3 mL Nebulization TID  . methylPREDNISolone (SOLU-MEDROL) injection  40 mg Intravenous Daily  . nortriptyline  25 mg Oral QHS  . pantoprazole  40 mg Oral Daily   Continuous Infusions: . ceFEPime (MAXIPIME) IV Stopped (12/19/20 1143)  . vancomycin Stopped (12/18/20 2252)     Procedures/Studies: CT Head Wo Contrast  Result Date: 12/17/2020 CLINICAL DATA:  Syncopal episode resulting in a fall, hitting the back of her head. EXAM: CT HEAD WITHOUT CONTRAST TECHNIQUE: Contiguous axial images were obtained from the base of the skull through the vertex without intravenous contrast. COMPARISON:  None. FINDINGS: Brain: Normal appearing cerebral hemispheres and posterior fossa structures. Normal size and position of the ventricles. No intracranial hemorrhage, mass lesion or CT evidence of acute infarction. Vascular: No hyperdense vessel or unexpected calcification. Skull: Normal. Negative for fracture or focal lesion. Sinuses/Orbits: Unremarkable. Other: None. IMPRESSION: Normal examination. Electronically Signed   By: Claudie Revering M.D.   On: 12/17/2020 19:04   CT ANGIO CHEST PE W OR WO CONTRAST  Result Date: 12/18/2020 CLINICAL DATA:  Shortness of breath. Positive D-dimer. Undergoing chemotherapy for ovarian carcinoma. EXAM: CT ANGIOGRAPHY CHEST WITH CONTRAST TECHNIQUE: Multidetector CT imaging of the chest was performed using the standard protocol during bolus administration of intravenous contrast. Multiplanar CT image reconstructions and MIPs were obtained to evaluate the vascular anatomy. CONTRAST:  41mL OMNIPAQUE IOHEXOL 350 MG/ML SOLN COMPARISON:  11/14/2020 FINDINGS: Cardiovascular: Satisfactory opacification of pulmonary arteries noted, and no pulmonary emboli identified. No evidence of thoracic aortic dissection or aneurysm. Aortic and coronary atherosclerotic calcification noted. Mediastinum/Nodes: No masses or pathologically enlarged lymph nodes identified. Lungs/Pleura: Moderate centrilobular emphysema again noted. New small bilateral pleural effusions and dependent atelectasis. New multifocal ill-defined nodular and airspace opacities are seen in the medial and anterior left upper lobe, suspicious for infectious or inflammatory process. Upper abdomen: No acute findings.  Musculoskeletal: No suspicious bone lesions identified. Review of the MIP images confirms the above findings. IMPRESSION: No evidence of pulmonary embolism. New multifocal ill-defined nodular and airspace opacities in left upper lobe, suspicious for infectious or inflammatory process. New small bilateral pleural effusions and dependent atelectasis, left side greater than right. Aortic Atherosclerosis (ICD10-I70.0) and Emphysema (ICD10-J43.9).  Electronically Signed   By: Marlaine Hind M.D.   On: 12/18/2020 18:33   DG Chest Port 1 View  Result Date: 12/17/2020 CLINICAL DATA:  Questionable sepsis - evaluate for abnormality Syncope fall out of bed. EXAM: PORTABLE CHEST 1 VIEW COMPARISON:  Chest radiograph 10/16/2020.  Chest CT 11/14/2020 FINDINGS: Left-sided chest port in place. New patchy opacity in the suprahilar left upper lobe. There are streaky opacities in both lung bases. Stable heart size and mediastinal contours. No pneumothorax or pleural effusion. Remote left sixth rib fracture. No acute osseous abnormalities are seen. IMPRESSION: Patchy suprahilar left upper lobe opacity, new from CT last month, suspicious for pneumonia. Streaky bibasilar opacities, may be atelectasis or additional sites of pneumonia. Electronically Signed   By: Keith Rake M.D.   On: 12/17/2020 19:07   EEG adult  Result Date: 12/19/2020 Lora Havens, MD     12/19/2020 10:05 AM Patient Name: AMERE IOTT MRN: [id number] Epilepsy Attending: Lora Havens Referring Physician/Provider: Dr. Shanon Brow In[place] Rother Date: 12/18/2020 Duration: 23.41 mins Patient history: 65 year old female with alteration of awareness.  EEG to evaluate for seizures. Level of alertness: Awake AEDs during EEG study: None Technical aspects: This EEG study was done with scalp electrodes positioned according to the 10-20 International system of electrode placement. Electrical activity was acquired at a sampling rate of 500Hz  and reviewed with a high frequency filter  of 70Hz  and a low frequency filter of 1Hz . EEG data were recorded continuously and digitally stored. Description: The posterior dominant rhythm consists of 9 Hz activity of moderate voltage (25-35 uV) seen predominantly in posterior head regions, symmetric and reactive to eye opening and eye closing. Hyperventilation and photic stimulation were not performed.   IMPRESSION: This study is within normal limits. No seizures or epileptiform discharges were seen throughout the recording. Lora Havens   ECHOCARDIOGRAM COMPLETE  Result Date: 12/18/2020    ECHOCARDIOGRAM REPORT   Patient Name:   JAZMINE HECKMAN Date of Exam: 12/18/2020 Medical Rec #:  [id number]     Height:       60.0 in Accession #:    [id number]    Weight:       118.0 lb Date of Birth:  21-Dec-1955     BSA:          1.492 m Patient Age:    43 years      BP:           124/64 mmHg Patient Gender: F             HR:           96 bpm. Exam Location:  [facility] Procedure: 2D Echo Indications:    Syncope 780.2 / R55  History:        Patient has no prior history of Echocardiogram examinations.                 COPD; Risk Factors:Hypertension, Former Smoker and Dyslipidemia.                 Ovarian Cancer.  Sonographer:    Leavy Cella RDCS (AE) Referring Phys: [id number] [place]a Satre IMPRESSIONS  1. Left ventricular ejection fraction, by estimation, is 60 to 65%. The left ventricle has normal function. The left ventricle has no regional wall motion abnormalities. There is mild left ventricular hypertrophy. Left ventricular diastolic parameters are indeterminate.  2. Right ventricular systolic function is normal. The right ventricular size is normal. Tricuspid regurgitation signal is inadequate for  assessing PA pressure.  3. The mitral valve is grossly normal. Trivial mitral valve regurgitation.  4. The aortic valve is tricuspid. There is mild calcification of the aortic valve. Aortic valve regurgitation is not visualized. Mild to moderate aortic valve  sclerosis/calcification is present, without any evidence of aortic stenosis.  5. The inferior vena cava is normal in size with greater than 50% respiratory variability, suggesting right atrial pressure of 3 mmHg. FINDINGS  Left Ventricle: Left ventricular ejection fraction, by estimation, is 60 to 65%. The left ventricle has normal function. The left ventricle has no regional wall motion abnormalities. The left ventricular internal cavity size was normal in size. There is  mild left ventricular hypertrophy. Left ventricular diastolic parameters are indeterminate. Right Ventricle: The right ventricular size is normal. No increase in right ventricular wall thickness. Right ventricular systolic function is normal. Tricuspid regurgitation signal is inadequate for assessing PA pressure. Left Atrium: Left atrial size was normal in size. Right Atrium: Right atrial size was normal in size. Pericardium: There is no evidence of pericardial effusion. Mitral Valve: The mitral valve is grossly normal. Trivial mitral valve regurgitation. Tricuspid Valve: The tricuspid valve is grossly normal. Tricuspid valve regurgitation is trivial. Aortic Valve: The aortic valve is tricuspid. There is mild calcification of the aortic valve. There is mild to moderate aortic valve annular calcification. Aortic valve regurgitation is not visualized. Mild to moderate aortic valve sclerosis/calcification is present, without any evidence of aortic stenosis. Pulmonic Valve: The pulmonic valve was grossly normal. Pulmonic valve regurgitation is trivial. Aorta: The aortic root is normal in size and structure. Venous: The inferior vena cava is normal in size with greater than 50% respiratory variability, suggesting right atrial pressure of 3 mmHg. IAS/Shunts: No atrial level shunt detected by color flow Doppler.  LEFT VENTRICLE PLAX 2D LVIDd:         3.72 cm  Diastology LVIDs:         2.40 cm  LV e' medial:    6.20 cm/s LV PW:         1.11 cm  LV E/e'  medial:  20.3 LV IVS:        1.11 cm  LV e' lateral:   10.90 cm/s LVOT diam:     2.00 cm  LV E/e' lateral: 11.6 LVOT Area:     3.14 cm  RIGHT VENTRICLE RV S prime:     16.30 cm/s TAPSE (M-mode): 2.3 cm LEFT ATRIUM             Index       RIGHT ATRIUM           Index LA diam:        3.00 cm 2.01 cm/m  RA Area:     14.20 cm LA Vol (A2C):   31.8 ml 21.32 ml/m RA Volume:   37.00 ml  24.80 ml/m LA Vol (A4C):   31.5 ml 21.12 ml/m LA Biplane Vol: 31.6 ml 21.18 ml/m   AORTA Ao Root diam: 3.30 cm MITRAL VALVE MV Area (PHT): 3.83 cm     SHUNTS MV Decel Time: 198 msec     Systemic Diam: 2.00 cm MV E velocity: 126.00 cm/s MV A velocity: 116.00 cm/s MV E/A ratio:  1.09 Rozann Lesches MD Electronically signed by Rozann Lesches MD Signature Date/Time: 12/18/2020/10:34:03 AM    Final     Orson Eva, DO  [facility]  If 7PM-7AM, please contact night-coverage [email or web address] Password [email or web address] 12/19/2020, 4:14 PM   LOS: 2  days

## 2020-12-19 NOTE — Progress Notes (Signed)
Patient states she wants boyfriend Abe People to come see her. She attempts to call him with no answer. I called him without an answer did not leave a message

## 2020-12-19 NOTE — Progress Notes (Signed)
Spoke with daughter of patient [name] who is concerned about patient. The patient keeps calling and saying she is in danger and needs help. Reassured her patient is physically stable and tolerated treatments well, but is paranoid at this time and has not slept. This may due to some of the medication she is on currently.  Also informed the daughter that patient has requested no visitors at this time

## 2020-12-19 NOTE — Progress Notes (Addendum)
We went in to draw labs at 5 am and patient became very confused and thinks we are trying to attack her. She started trying to walk down the hall and screaming for someone to help her.  02 sats dropped to 80's because she was taking her nasal cannula off.  Attempted to give a neb treatment but patient refused.  Hospitalist Dr. Josephine Cables notified.  He was in the middle of an admission and will come by to assess her.  No orders given at this time.

## 2020-12-19 NOTE — Progress Notes (Signed)
Spoke with daughter and given update. States she is concerned that patient is not answering phone and when she spoke with her earlier she was not making sense. Updated her that patient is stable and oriented to person, place and time. Doctor is aware of these changes. Paged hospitalist to come assess patient

## 2020-12-19 NOTE — Procedures (Signed)
Patient Name: [name]  MRN: [id number]  Epilepsy Attending: Lora Havens  Referring Physician/Provider: Dr. Shanon Brow Tat Date: 12/18/2020 Duration: 23.41 mins  Patient history: 65 year old female with alteration of awareness.  EEG to evaluate for seizures.  Level of alertness: Awake  AEDs during EEG study: None  Technical aspects: This EEG study was done with scalp electrodes positioned according to the 10-20 International system of electrode placement. Electrical activity was acquired at a sampling rate of 500Hz  and reviewed with a high frequency filter of 70Hz  and a low frequency filter of 1Hz . EEG data were recorded continuously and digitally stored.   Description: The posterior dominant rhythm consists of 9 Hz activity of moderate voltage (25-35 uV) seen predominantly in posterior head regions, symmetric and reactive to eye opening and eye closing. Hyperventilation and photic stimulation were not performed.     IMPRESSION: This study is within normal limits. No seizures or epileptiform discharges were seen throughout the recording.  [name] [name]

## 2020-12-19 NOTE — Progress Notes (Signed)
Breath sounds prior to to nebulizer treatment 90% on 4 liters upper airway expiratory wheezing, lower airway coarse crackles,  Bilaterally, after treatment O2 saturation 95% on 3L upper airway clearer with good air movemnt bilateral lower lobes coarse crackles with better air movement.

## 2020-12-19 NOTE — Progress Notes (Signed)
Mrs Mile's daughter, Marcell Barlow called to check on her mother.  Her mother had text 911 help me to her son during the night.  I told her her vital signs are stable, she had not sleep any and watched TV most of the night,  and that she got a little confused around 5 am when we went in to draw blood and the thought we were coming to get her and screamed and walked out into the hall. We did not draw any blood at this time and our tech sat with Mrs Iacovelli the rest of the night.  Lorna Few RN will have the hospitalist call her daughter after rounds.

## 2020-12-19 NOTE — Progress Notes (Signed)
Spoke with boyfriend  Dalene Seltzer who is concerned regarding patient's confusion. Assured him that she is physically stable and doctor is aware of confusion. Patient at this time states she does not want to see him. This conversation with him was per Helen Hashimoto per Patient Advocate for [facility] Na who is involved with this family

## 2020-12-19 NOTE — Progress Notes (Signed)
Patient refusing  1800 meds zithromax, lovenox, maxipime at this time. States she thinks meds are not really hers and is afraid they will hurt her.  1830 patient agrees to take meds. Showed her the pharmacy labels with her name. She wants her son [name] to stay until she falls asleep

## 2020-12-20 DIAGNOSIS — C569 Malignant neoplasm of unspecified ovary: Secondary | ICD-10-CM

## 2020-12-20 LAB — COMPREHENSIVE METABOLIC PANEL
ALT: 7 U/L (ref 0–44)
AST: 13 U/L — ABNORMAL LOW (ref 15–41)
Albumin: 3.1 g/dL — ABNORMAL LOW (ref 3.5–5.0)
Alkaline Phosphatase: 74 U/L (ref 38–126)
Anion gap: 10 (ref 5–15)
BUN: 6 mg/dL — ABNORMAL LOW (ref 8–23)
CO2: 30 mmol/L (ref 22–32)
Calcium: 8.4 mg/dL — ABNORMAL LOW (ref 8.9–10.3)
Chloride: 87 mmol/L — ABNORMAL LOW (ref 98–111)
Creatinine, Ser: 0.4 mg/dL — ABNORMAL LOW (ref 0.44–1.00)
GFR, Estimated: >60 mL/min (ref >60)
Glucose, Bld: 108 mg/dL — ABNORMAL HIGH (ref 70–99)
Potassium: 3.3 mmol/L — ABNORMAL LOW (ref 3.5–5.1)
Sodium: 127 mmol/L — ABNORMAL LOW (ref 135–145)
Total Bilirubin: 0.5 mg/dL (ref 0.3–1.2)
Total Protein: 6.3 g/dL — ABNORMAL LOW (ref 6.5–8.1)

## 2020-12-20 LAB — MAGNESIUM: Magnesium: 1.6 mg/dL — ABNORMAL LOW (ref 1.7–2.4)

## 2020-12-20 LAB — CBC
HCT: 27 % — ABNORMAL LOW (ref 36.0–46.0)
Hemoglobin: 8.9 g/dL — ABNORMAL LOW (ref 12.0–15.0)
MCH: 33.5 pg (ref 26.0–34.0)
MCHC: 33 g/dL (ref 30.0–36.0)
MCV: 101.5 fL — ABNORMAL HIGH (ref 80.0–100.0)
Platelets: 126 10*3/uL — ABNORMAL LOW (ref 150–400)
RBC: 2.66 MIL/uL — ABNORMAL LOW (ref 3.87–5.11)
RDW: 14.5 % (ref 11.5–15.5)
WBC: 6.3 10*3/uL (ref 4.0–10.5)
nRBC: 0 % (ref 0.0–0.2)

## 2020-12-20 MED ORDER — MAGNESIUM SULFATE 2 GM/50ML IV SOLN
2.0000 g | Freq: Once | INTRAVENOUS | Status: AC
  Administered 2020-12-20: 2 g via INTRAVENOUS
  Filled 2020-12-20: qty 50

## 2020-12-20 MED ORDER — POTASSIUM CHLORIDE CRYS ER 20 MEQ PO TBCR
40.0000 meq | EXTENDED_RELEASE_TABLET | Freq: Once | ORAL | Status: AC
  Administered 2020-12-20: 40 meq via ORAL
  Filled 2020-12-20: qty 2

## 2020-12-20 NOTE — Discharge Instructions (Signed)
Please finish course of levofloxacin (levaquin) that you have already started.  Community-Acquired Pneumonia, Adult Pneumonia is a lung infection that causes inflammation and the buildup of mucus and fluids in the lungs. This may cause coughing and difficulty breathing. Community-acquired pneumonia is pneumonia that develops in people who are not, and have not recently been, in a hospital or other health care facility. Usually, pneumonia develops as a result of an illness that is caused by a virus, such as the common cold and the flu (influenza). It can also be caused by bacteria or fungi. While the common cold and influenza can pass from person to person (are contagious), pneumonia itself is not considered contagious. What are the causes? This condition may be caused by:  Viruses.  Bacteria.  Fungi, such as molds or mushrooms.   What increases the risk? The following factors may make you more likely to develop this condition:  Having certain medical conditions, such as: ? A long-term (chronic) disease, which may include chronic obstructive pulmonary disease (COPD), asthma, heart failure, cystic fibrosis, diabetes, kidney disease, sickle cell disease, and human immunodeficiency virus (HIV). ? A condition that increases the risk of breathing in (aspirating) mucus and other fluids from your mouth and nose. ? A weakened body defense system (immune system).  Having had your spleen removed (splenectomy). The spleen is the organ that helps fight germs and infections.  Not cleaning your teeth and gums well (poor dental hygiene).  Using tobacco products.  Traveling to places where germs that cause pneumonia are present.  Being near certain animals, or animal habitats, that have germs that cause pneumonia.  Being older than 63 years of age. What are the signs or symptoms? Symptoms of this condition include:  A dry cough or a wet (productive) cough.  A fever.  Sweating or  chills.  Chest pain, especially when breathing deeply or coughing.  Fast breathing, difficulty breathing, or shortness of breath.  Tiredness (fatigue).  Muscle aches. How is this diagnosed? This condition may be diagnosed based on your medical history or a physical exam. You may also have tests, including:  Chest X-rays.  Tests of the level of oxygen and other gases in your blood.  Tests of: ? Your blood. ? Mucus from your lungs (sputum). ? Fluid around your lungs (pleural fluid). ? Your urine. If your pneumonia is severe, other tests may be done to learn more about the cause.   How is this treated? Treatment for this condition depends on many factors, such as the cause of your pneumonia, your medicines, and other medical conditions that you have. For most adults, pneumonia may be treated at home. In some cases, treatment must happen in a hospital and may include:  Medicines that are given by mouth (orally) or through an IV, including: ? Antibiotic medicines, if bacteria caused the pneumonia. ? Medicines that kill viruses (antiviral medicines), if a virus caused the pneumonia.  Oxygen therapy. Severe pneumonia, although rare, may require the following treatments:  Mechanical ventilation.This procedure uses a machine to help you breathe if you cannot breathe well on your own or maintain a safe level of blood oxygen.  Thoracentesis. This procedure removes any buildup of pleural fluid to help with breathing. Follow these instructions at home: Medicines  Take over-the-counter and prescription medicines only as told by your health care provider.  Take cough medicine only if you have trouble sleeping. Cough medicine can prevent your body from removing mucus from your lungs.  If you were  prescribed an antibiotic medicine, take it as told by your health care provider. Do not stop taking the antibiotic even if you start to feel better. Lifestyle  Do not drink alcohol.  Do not  use any products that contain nicotine or tobacco, such as cigarettes, e-cigarettes, and chewing tobacco. If you need help quitting, ask your health care provider.  Eat a healthy diet. This includes plenty of vegetables, fruits, whole grains, low-fat dairy products, and lean protein.      General instructions  Rest a lot and get at least 8 hours of sleep each night.  Sleep in a partly upright position at night. Place a few pillows under your head or sleep in a reclining chair.  Return to your normal activities as told by your health care provider. Ask your health care provider what activities are safe for you.  Drink enough fluid to keep your urine pale yellow. This helps to thin the mucus in your lungs.  If your throat is sore, gargle with a salt-water mixture 3-4 times a day or as needed. To make a salt-water mixture, completely dissolve -1 tsp (3-6 g) of salt in 1 cup (237 mL) of warm water.  Keep all follow-up visits as told by your health care provider. This is important.   How is this prevented? You can lower your risk of developing community-acquired pneumonia by:  Getting the pneumonia vaccine. There are different types and schedules of pneumonia vaccines. Ask your health care provider which option is best for you. Consider getting the pneumonia vaccine if: ? You are older than 63 years of age. ? You are 70-14 years of age and are receiving cancer treatment, have chronic lung disease, or have other medical conditions that affect your immune system. Ask your health care provider if this applies to you.  Getting your influenza vaccine every year. Ask your health care provider which type of vaccine is best for you.  Getting regular dental checkups.  Washing your hands often with soap and water for at least 20 seconds. If soap and water are not available, use hand sanitizer. Contact a health care provider if you have:  A fever.  Trouble sleeping because you cannot control your  cough with cough medicine. Get help right away if:  Your shortness of breath becomes worse.  Your chest pain increases.  Your sickness becomes worse, especially if you are an older adult or have a weak immune system.  You cough up blood. These symptoms may represent a serious problem that is an emergency. Do not wait to see if the symptoms will go away. Get medical help right away. Call your local emergency services (911 in the [place] Do not drive yourself to the hospital. Summary  Pneumonia is an infection of the lungs.  Community-acquired pneumonia develops in people who have not been in the hospital. It can be caused by bacteria, viruses, or fungi.  This condition may be treated with antibiotics or antiviral medicines.  Severe pneumonia may require a hospital stay and treatment to help with breathing. This information is not intended to replace advice given to you by your health care provider. Make sure you discuss any questions you have with your health care provider. Document Revised: 08/17/2019 Document Reviewed: 08/17/2019 Elsevier Patient Education  2021 [employer].   IMPORTANT INFORMATION: PAY CLOSE ATTENTION   PHYSICIAN DISCHARGE INSTRUCTIONS  Follow with Primary care provider  [name], MD  and other consultants as instructed by your Hospitalist Physician  SEEK MEDICAL CARE OR RETURN TO EMERGENCY ROOM IF SYMPTOMS COME BACK, WORSEN OR NEW PROBLEM DEVELOPS   Please note: You were cared for by a hospitalist during your hospital stay. Every effort will be made to forward records to your primary care provider.  You can request that your primary care provider send for your hospital records if they have not received them.  Once you are discharged, your primary care physician will handle any further medical issues. Please note that NO REFILLS for any discharge medications will be authorized once you are discharged, as it is imperative that you return to your primary care  physician (or establish a relationship with a primary care physician if you do not have one) for your post hospital discharge needs so that they can reassess your need for medications and monitor your lab values.  Please get a complete blood count and chemistry panel checked by your Primary MD at your next visit, and again as instructed by your Primary MD.  Get Medicines reviewed and adjusted: Please take all your medications with you for your next visit with your Primary MD  Laboratory/radiological data: Please request your Primary MD to go over all hospital tests and procedure/radiological results at the follow up, please ask your primary care provider to get all Hospital records sent to his/her office.  In some cases, they will be blood work, cultures and biopsy results pending at the time of your discharge. Please request that your primary care provider follow up on these results.  If you are diabetic, please bring your blood sugar readings with you to your follow up appointment with primary care.    Please call and make your follow up appointments as soon as possible.    Also Note the following: If you experience worsening of your admission symptoms, develop shortness of breath, life threatening emergency, suicidal or homicidal thoughts you must seek medical attention immediately by calling 911 or calling your MD immediately  if symptoms less severe.  You must read complete instructions/literature along with all the possible adverse reactions/side effects for all the Medicines you take and that have been prescribed to you. Take any new Medicines after you have completely understood and accpet all the possible adverse reactions/side effects.   Do not drive when taking Pain medications or sleeping medications (Benzodiazepines)  Do not take more than prescribed Pain, Sleep and Anxiety Medications. It is not advisable to combine anxiety,sleep and pain medications without talking with your  primary care practitioner  Special Instructions: If you have smoked or chewed Tobacco  in the last 2 yrs please stop smoking, stop any regular Alcohol  and or any Recreational drug use.  Wear Seat belts while driving.  Do not drive if taking any narcotic, mind altering or controlled substances or recreational drugs or alcohol.

## 2020-12-20 NOTE — Progress Notes (Signed)
Patient to go home with home care by [facility] set up by social work. Patient does not have her own personal oxygen here. Attempted to contact, Billy (significant other) to bring portable O2, attempted to contact Elvin So sister no answer to both calls. Will continue to attempt to contact to bring home O2 for discharge.    Charlie son at bedside also texted the above individuals to bring home o2

## 2020-12-20 NOTE — Progress Notes (Signed)
Boyfriend Abe People out in lobby given update on status, verbalized understanding. Abe People states if patient is discharged he will be her ride home they have lived together 17 years

## 2020-12-20 NOTE — Progress Notes (Signed)
Patient agreeable this morning but still says she is going to die soon. Sister Hassan Rowan at bedside visiting with patient. Patient is cooperative with treatments today.

## 2020-12-20 NOTE — Progress Notes (Signed)
1100 patient bathed self and bedside, oral care, dentures soaked and cleaned and back in mouth, fresh gown and linens. Patient tolerated well.  1430--patient tells me Abe People her significant other is on his way for discharge with the portable Oxygen.

## 2020-12-20 NOTE — Discharge Summary (Signed)
Physician Discharge Summary  [name] [id number] DOB: Nov 19, 1955 DOA: 12/17/2020  PCP: [name], MD Oncologist: [name]  Admit date: 12/17/2020 Discharge date: 12/20/2020  Admitted From:  Home  Disposition: Home with [facility]  Recommendations for Outpatient Follow-up:  1. Follow up with oncology in 1-2 weeks 2. Follow up with PCP in 2 weeks  Home Health: RN, PT  Discharge Condition: STABLE   CODE STATUS: FULL    Brief Hospitalization Summary: Please see all hospital notes, images, labs for full details of the hospitalization. ADMISSION HPI: [name] is a 65 y.o. female with medical history significant of ovarian cancer on chemo, COPD, chronic respiratory failure on 3 L of oxygen continuous at home comes in with a syncopal episode.  Patient has been fighting pneumonia and has been on Levaquin for about 48 hours.  She has persistently had fevers chills and cough.  She does not remember falling out of her bed and passing out.  However her boyfriend found her on the floor passed out.  EMS was called she was brought to the emergency department.  She denies any nausea vomiting or diarrhea.  She feels back to normal.  Again has no idea what occurred during the syncopal episode.  Patient found to have pneumonia was tachycardic on arrival thought to be septic referred for admission for pneumonia failing outpatient treatment with Levaquin.  Hospital Course   Pt was admitted with sepsis secondary to lobar pneumonia.  She was treated with cepfepime and azithromycin.  She was treated with IV fluids and electrolytes corrected.  Blood cultures no growth to date.  CTA chest with no PE findings but new multifocal ill defined nodular airspace opacities LUL.  Pt responded well to treatments.  She feels better.  She has good outpatient follow up.  She will complete course of levofloxacin started prior to admission. She is stable to discharge home.    Discharge Diagnoses:  Principal  Problem:   CAP (community acquired pneumonia) Active Problems:   Chronic respiratory failure ([place])   Peritoneal carcinomatosis ([place])   Ovarian cancer ([place])   Syncope   Sepsis due to undetermined organism ([place])   Lobar pneumonia [facility])   Discharge Instructions: Discharge Instructions    Ambulatory referral to Hematology / Oncology   Complete by: As directed    Hospital follow up visit     Allergies as of 12/20/2020   No Known Allergies     Medication List    TAKE these medications   acetylcysteine 20 % nebulizer solution Commonly known as: MUCOMYST Inhale into the lungs.   albuterol 108 (90 Base) MCG/ACT inhaler Commonly known as: VENTOLIN HFA Inhale 4-6 puffs into the lungs every 6 (six) hours as needed for wheezing or shortness of breath.   amLODipine 5 MG tablet Commonly known as: NORVASC Take 5 mg by mouth at bedtime.   aspirin EC 81 MG tablet Take 81 mg by mouth daily.   CARBOPLATIN IV Inject into the vein every 21 ( twenty-one) days.   ferrous sulfate 325 (65 FE) MG tablet Take 325 mg by mouth every other day.   gabapentin 300 MG capsule Commonly known as: NEURONTIN Take 300 mg by mouth 3 (three) times daily.   ipratropium 17 MCG/ACT inhaler Commonly known as: ATROVENT HFA Inhale 2 puffs into the lungs every 6 (six) hours as needed for wheezing.   ipratropium-albuterol 0.5-2.5 (3) MG/3ML Soln Commonly known as: DUONEB Take 0.5 mg by nebulization every 6 (six) hours as  needed.   levalbuterol 1.25 MG/3ML nebulizer solution Commonly known as: XOPENEX Inhale into the lungs.   levofloxacin 500 MG tablet Commonly known as: LEVAQUIN Take 500 mg by mouth daily.   nortriptyline 25 MG capsule Commonly known as: PAMELOR Take 25 mg by mouth at bedtime.   ondansetron 4 MG disintegrating tablet Commonly known as: Zofran ODT 4mg  ODT q4 hours prn nausea/vomit What changed:   how much to take  how to take this  when to take this  reasons to take  this  additional instructions   OXYGEN Inhale 3 L into the lungs continuous.   PACLITAXEL IV Inject into the vein every 21 ( twenty-one) days.   pantoprazole 40 MG tablet Commonly known as: PROTONIX Take 1 tablet (40 mg total) by mouth daily.   predniSONE 5 MG tablet Commonly known as: DELTASONE Take 5 mg by mouth daily with breakfast.   prochlorperazine 10 MG tablet Commonly known as: COMPAZINE Take 1 tablet (10 mg total) by mouth every 6 (six) hours as needed (Nausea or vomiting).   simvastatin 40 MG tablet Commonly known as: ZOCOR Take 40 mg by mouth at bedtime.   Trelegy Ellipta 100-62.5-25 MCG/INH Aepb Generic drug: Fluticasone-Umeclidin-Vilant Inhale 1 puff into the lungs 2 (two) times daily.       Follow-up Information    [name], MD. Schedule an appointment as soon as possible for a visit in 2 week(s).   Specialty: Family Medicine Contact information: [place] [address] [phone]        Derek Jack, MD. Schedule an appointment as soon as possible for a visit in 1 week(s).   Specialty: Hematology Contact information: [place] [address] [phone]              No Known Allergies Allergies as of 12/20/2020   No Known Allergies     Medication List    TAKE these medications   acetylcysteine 20 % nebulizer solution Commonly known as: MUCOMYST Inhale into the lungs.   albuterol 108 (90 Base) MCG/ACT inhaler Commonly known as: VENTOLIN HFA Inhale 4-6 puffs into the lungs every 6 (six) hours as needed for wheezing or shortness of breath.   amLODipine 5 MG tablet Commonly known as: NORVASC Take 5 mg by mouth at bedtime.   aspirin EC 81 MG tablet Take 81 mg by mouth daily.   CARBOPLATIN IV Inject into the vein every 21 ( twenty-one) days.   ferrous sulfate 325 (65 FE) MG tablet Take 325 mg by mouth every other day.   gabapentin 300 MG capsule Commonly known as: NEURONTIN Take  300 mg by mouth 3 (three) times daily.   ipratropium 17 MCG/ACT inhaler Commonly known as: ATROVENT HFA Inhale 2 puffs into the lungs every 6 (six) hours as needed for wheezing.   ipratropium-albuterol 0.5-2.5 (3) MG/3ML Soln Commonly known as: DUONEB Take 0.5 mg by nebulization every 6 (six) hours as needed.   levalbuterol 1.25 MG/3ML nebulizer solution Commonly known as: XOPENEX Inhale into the lungs.   levofloxacin 500 MG tablet Commonly known as: LEVAQUIN Take 500 mg by mouth daily.   nortriptyline 25 MG capsule Commonly known as: PAMELOR Take 25 mg by mouth at bedtime.   ondansetron 4 MG disintegrating tablet Commonly known as: Zofran ODT 4mg  ODT q4 hours prn nausea/vomit What changed:   how much to take  how to take this  when to take this  reasons to take this  additional instructions  OXYGEN Inhale 3 L into the lungs continuous.   PACLITAXEL IV Inject into the vein every 21 ( twenty-one) days.   pantoprazole 40 MG tablet Commonly known as: PROTONIX Take 1 tablet (40 mg total) by mouth daily.   predniSONE 5 MG tablet Commonly known as: DELTASONE Take 5 mg by mouth daily with breakfast.   prochlorperazine 10 MG tablet Commonly known as: COMPAZINE Take 1 tablet (10 mg total) by mouth every 6 (six) hours as needed (Nausea or vomiting).   simvastatin 40 MG tablet Commonly known as: ZOCOR Take 40 mg by mouth at bedtime.   Trelegy Ellipta 100-62.5-25 MCG/INH Aepb Generic drug: Fluticasone-Umeclidin-Vilant Inhale 1 puff into the lungs 2 (two) times daily.       Procedures/Studies: CT Head Wo Contrast  Result Date: 12/17/2020 CLINICAL DATA:  Syncopal episode resulting in a fall, hitting the back of her head. EXAM: CT HEAD WITHOUT CONTRAST TECHNIQUE: Contiguous axial images were obtained from the base of the skull through the vertex without intravenous contrast. COMPARISON:  None. FINDINGS: Brain: Normal appearing cerebral hemispheres and posterior  fossa structures. Normal size and position of the ventricles. No intracranial hemorrhage, mass lesion or CT evidence of acute infarction. Vascular: No hyperdense vessel or unexpected calcification. Skull: Normal. Negative for fracture or focal lesion. Sinuses/Orbits: Unremarkable. Other: None. IMPRESSION: Normal examination. Electronically Signed   By: Claudie Revering M.D.   On: 12/17/2020 19:04   CT ANGIO CHEST PE W OR WO CONTRAST  Result Date: 12/18/2020 CLINICAL DATA:  Shortness of breath. Positive D-dimer. Undergoing chemotherapy for ovarian carcinoma. EXAM: CT ANGIOGRAPHY CHEST WITH CONTRAST TECHNIQUE: Multidetector CT imaging of the chest was performed using the standard protocol during bolus administration of intravenous contrast. Multiplanar CT image reconstructions and MIPs were obtained to evaluate the vascular anatomy. CONTRAST:  68mL OMNIPAQUE IOHEXOL 350 MG/ML SOLN COMPARISON:  11/14/2020 FINDINGS: Cardiovascular: Satisfactory opacification of pulmonary arteries noted, and no pulmonary emboli identified. No evidence of thoracic aortic dissection or aneurysm. Aortic and coronary atherosclerotic calcification noted. Mediastinum/Nodes: No masses or pathologically enlarged lymph nodes identified. Lungs/Pleura: Moderate centrilobular emphysema again noted. New small bilateral pleural effusions and dependent atelectasis. New multifocal ill-defined nodular and airspace opacities are seen in the medial and anterior left upper lobe, suspicious for infectious or inflammatory process. Upper abdomen: No acute findings. Musculoskeletal: No suspicious bone lesions identified. Review of the MIP images confirms the above findings. IMPRESSION: No evidence of pulmonary embolism. New multifocal ill-defined nodular and airspace opacities in left upper lobe, suspicious for infectious or inflammatory process. New small bilateral pleural effusions and dependent atelectasis, left side greater than right. Aortic  Atherosclerosis (ICD10-I70.0) and Emphysema (ICD10-J43.9). Electronically Signed   By: Marlaine Hind M.D.   On: 12/18/2020 18:33   DG Chest Port 1 View  Result Date: 12/17/2020 CLINICAL DATA:  Questionable sepsis - evaluate for abnormality Syncope fall out of bed. EXAM: PORTABLE CHEST 1 VIEW COMPARISON:  Chest radiograph 10/16/2020.  Chest CT 11/14/2020 FINDINGS: Left-sided chest port in place. New patchy opacity in the suprahilar left upper lobe. There are streaky opacities in both lung bases. Stable heart size and mediastinal contours. No pneumothorax or pleural effusion. Remote left sixth rib fracture. No acute osseous abnormalities are seen. IMPRESSION: Patchy suprahilar left upper lobe opacity, new from CT last month, suspicious for pneumonia. Streaky bibasilar opacities, may be atelectasis or additional sites of pneumonia. Electronically Signed   By: Keith Rake M.D.   On: 12/17/2020 19:07   EEG adult  Result Date:  12/19/2020 Lora Havens, MD     12/19/2020 10:05 AM Patient Name: [name] MRN: [id number] Epilepsy Attending: Lora Havens Referring Physician/Provider: Dr. Shanon Brow Tat Date: 12/18/2020 Duration: 23.41 mins Patient history: 65 year old female with alteration of awareness.  EEG to evaluate for seizures. Level of alertness: Awake AEDs during EEG study: None Technical aspects: This EEG study was done with scalp electrodes positioned according to the 10-20 International system of electrode placement. Electrical activity was acquired at a sampling rate of 500Hz  and reviewed with a high frequency filter of 70Hz  and a low frequency filter of 1Hz . EEG data were recorded continuously and digitally stored. Description: The posterior dominant rhythm consists of 9 Hz activity of moderate voltage (25-35 uV) seen predominantly in posterior head regions, symmetric and reactive to eye opening and eye closing. Hyperventilation and photic stimulation were not performed.   IMPRESSION: This study is  within normal limits. No seizures or epileptiform discharges were seen throughout the recording. Lora Havens   ECHOCARDIOGRAM COMPLETE  Result Date: 12/18/2020    ECHOCARDIOGRAM REPORT   Patient Name:   [name] Date of Exam: 12/18/2020 Medical Rec #:  [id number]     Height:       60.0 in Accession #:    [id number]    Weight:       118.0 lb Date of Birth:  Mar 05, 1956     BSA:          1.492 m Patient Age:    27 years      BP:           124/64 mmHg Patient Gender: F             HR:           96 bpm. Exam Location:  [facility] Procedure: 2D Echo Indications:    Syncope 780.2 / R55  History:        Patient has no prior history of Echocardiogram examinations.                 COPD; Risk Factors:Hypertension, Former Smoker and Dyslipidemia.                 Ovarian Cancer.  Sonographer:    Leavy Cella RDCS (AE) Referring Phys: [phone] DAVID TAT IMPRESSIONS  1. Left ventricular ejection fraction, by estimation, is 60 to 65%. The left ventricle has normal function. The left ventricle has no regional wall motion abnormalities. There is mild left ventricular hypertrophy. Left ventricular diastolic parameters are indeterminate.  2. Right ventricular systolic function is normal. The right ventricular size is normal. Tricuspid regurgitation signal is inadequate for assessing PA pressure.  3. The mitral valve is grossly normal. Trivial mitral valve regurgitation.  4. The aortic valve is tricuspid. There is mild calcification of the aortic valve. Aortic valve regurgitation is not visualized. Mild to moderate aortic valve sclerosis/calcification is present, without any evidence of aortic stenosis.  5. The inferior vena cava is normal in size with greater than 50% respiratory variability, suggesting right atrial pressure of 3 mmHg. FINDINGS  Left Ventricle: Left ventricular ejection fraction, by estimation, is 60 to 65%. The left ventricle has normal function. The left ventricle has no regional wall motion abnormalities.  The left ventricular internal cavity size was normal in size. There is  mild left ventricular hypertrophy. Left ventricular diastolic parameters are indeterminate. Right Ventricle: The right ventricular size is normal. No increase in right ventricular wall thickness. Right ventricular systolic function  is normal. Tricuspid regurgitation signal is inadequate for assessing PA pressure. Left Atrium: Left atrial size was normal in size. Right Atrium: Right atrial size was normal in size. Pericardium: There is no evidence of pericardial effusion. Mitral Valve: The mitral valve is grossly normal. Trivial mitral valve regurgitation. Tricuspid Valve: The tricuspid valve is grossly normal. Tricuspid valve regurgitation is trivial. Aortic Valve: The aortic valve is tricuspid. There is mild calcification of the aortic valve. There is mild to moderate aortic valve annular calcification. Aortic valve regurgitation is not visualized. Mild to moderate aortic valve sclerosis/calcification is present, without any evidence of aortic stenosis. Pulmonic Valve: The pulmonic valve was grossly normal. Pulmonic valve regurgitation is trivial. Aorta: The aortic root is normal in size and structure. Venous: The inferior vena cava is normal in size with greater than 50% respiratory variability, suggesting right atrial pressure of 3 mmHg. IAS/Shunts: No atrial level shunt detected by color flow Doppler.  LEFT VENTRICLE PLAX 2D LVIDd:         3.72 cm  Diastology LVIDs:         2.40 cm  LV e' medial:    6.20 cm/s LV PW:         1.11 cm  LV E/e' medial:  20.3 LV IVS:        1.11 cm  LV e' lateral:   10.90 cm/s LVOT diam:     2.00 cm  LV E/e' lateral: 11.6 LVOT Area:     3.14 cm  RIGHT VENTRICLE RV S prime:     16.30 cm/s TAPSE (M-mode): 2.3 cm LEFT ATRIUM             Index       RIGHT ATRIUM           Index LA diam:        3.00 cm 2.01 cm/m  RA Area:     14.20 cm LA Vol (A2C):   31.8 ml 21.32 ml/m RA Volume:   37.00 ml  24.80 ml/m LA Vol  (A4C):   31.5 ml 21.12 ml/m LA Biplane Vol: 31.6 ml 21.18 ml/m   AORTA Ao Root diam: 3.30 cm MITRAL VALVE MV Area (PHT): 3.83 cm     SHUNTS MV Decel Time: 198 msec     Systemic Diam: 2.00 cm MV E velocity: 126.00 cm/s MV A velocity: 116.00 cm/s MV E/A ratio:  1.09 Rozann Lesches MD Electronically signed by Rozann Lesches MD Signature Date/Time: 12/18/2020/10:34:03 AM    Final       Subjective: Pt says she is feeling a lot better today.  She has good appetite.   Discharge Exam: Vitals:   12/20/20 0837 12/20/20 0902  BP: (!) 145/84   Pulse: 96   Resp:    Temp: 98.1 F (36.7 C)   SpO2: 98% 97%   Vitals:   12/19/20 1800 12/20/20 0200 12/20/20 0837 12/20/20 0902  BP:   (!) 145/84   Pulse: (!) 110  96   Resp:      Temp: 97.7 F (36.5 C) 98.1 F (36.7 C) 98.1 F (36.7 C)   TempSrc:  Oral Oral   SpO2: 98%  98% 97%  Weight:      Height:       General: Pt is alert, awake, not in acute distress Cardiovascular: normal S1/S2 +, no rubs, no gallops Respiratory: CTA bilaterally, no wheezing, no rhonchi Abdominal: Soft, NT, ND, bowel sounds + Extremities: no edema, no cyanosis   The results of  significant diagnostics from this hospitalization (including imaging, microbiology, ancillary and laboratory) are listed below for reference.     Microbiology: Recent Results (from the past 240 hour(s))  Blood Culture (routine x 2)     Status: None (Preliminary result)   Collection Time: 12/17/20  7:04 PM   Specimen: BLOOD RIGHT FOREARM  Result Value Ref Range Status   Specimen Description BLOOD RIGHT FOREARM  Final   Special Requests   Final    BOTTLES DRAWN AEROBIC AND ANAEROBIC Blood Culture adequate volume   Culture   Final    NO GROWTH 3 DAYS Performed at [facility], [address], [place], [place] [address]    Report Status PENDING  Incomplete  SARS Coronavirus 2 by RT PCR (hospital order, performed in [place] hospital lab) Nasopharyngeal Nasopharyngeal Swab     Status:  None   Collection Time: 12/17/20  7:05 PM   Specimen: Nasopharyngeal Swab  Result Value Ref Range Status   SARS Coronavirus 2 NEGATIVE NEGATIVE Final    Comment: (NOTE) SARS-CoV-2 target nucleic acids are NOT DETECTED.  The SARS-CoV-2 RNA is generally detectable in upper and lower respiratory specimens during the acute phase of infection. The lowest concentration of SARS-CoV-2 viral copies this assay can detect is 250 copies / mL. A negative result does not preclude SARS-CoV-2 infection and should not be used as the sole basis for treatment or other patient management decisions.  A negative result may occur with improper specimen collection / handling, submission of specimen other than nasopharyngeal swab, presence of viral mutation(s) within the areas targeted by this assay, and inadequate number of viral copies (<250 copies / mL). A negative result must be combined with clinical observations, patient history, and epidemiological information.  Fact Sheet for Patients:   StrictlyIdeas.no  Fact Sheet for Healthcare Providers: BankingDealers.co.za  This test is not yet approved or  cleared by the [place] FDA and has been authorized for detection and/or diagnosis of SARS-CoV-2 by FDA under an Emergency Use Authorization (EUA).  This EUA will remain in effect (meaning this test can be used) for the duration of the COVID-19 declaration under Section 564(b)(1) of the Act, 21 U.S.C. section 360bbb-3(b)(1), unless the authorization is terminated or revoked sooner.  Performed at [facility], [address], [place], [place] [address]   Blood Culture (routine x 2)     Status: None (Preliminary result)   Collection Time: 12/17/20  7:05 PM   Specimen: BLOOD  Result Value Ref Range Status   Specimen Description BLOOD BLOOD LEFT WRIST  Final   Special Requests   Final    BOTTLES DRAWN AEROBIC AND ANAEROBIC Blood Culture results may not  be optimal due to an excessive volume of blood received in culture bottles   Culture   Final    NO GROWTH 3 DAYS Performed at [facility], [address] [place], [place] [address]    Report Status PENDING  Incomplete  Urine culture     Status: None   Collection Time: 12/17/20 10:00 PM   Specimen: Urine, Clean Catch  Result Value Ref Range Status   Specimen Description   Final    URINE, CLEAN CATCH Performed at [facility], [address] [place], [place] [address]    Special Requests   Final    NONE Performed at [facility], [address], [place], [place] [address]    Culture   Final    NO GROWTH Performed at [facility], [address] [place], [place] [address]    Report Status 12/19/2020 FINAL  Final  MRSA PCR Screening     Status: None   Collection Time: 12/19/20  2:03 PM   Specimen: Nasopharyngeal  Result Value Ref Range Status   MRSA by PCR NEGATIVE NEGATIVE Final    Comment:        The GeneXpert MRSA Assay (FDA approved for NASAL specimens only), is one component of a comprehensive MRSA colonization surveillance program. It is not intended to diagnose MRSA infection nor to guide or monitor treatment for MRSA infections. Performed at [facility], [address], [place], [place] [address]      Labs: BNP (last 3 results) No results for input(s): BNP in the last 8760 hours. Basic Metabolic Panel: Recent Labs  Lab 12/17/20 1904 12/17/20 2117 12/18/20 0433 12/19/20 0447 12/20/20 0659  NA 128*  --  135 134* 127*  K 3.3*  --  3.1* 3.0* 3.3*  CL 87*  --  93* 91* 87*  CO2 31  --  33* 33* 30  GLUCOSE 121*  --  106* 109* 108*  BUN 12  --  7* 9 6*  CREATININE 0.47 0.44 0.38* 0.45 0.40*  CALCIUM 8.6*  --  8.2* 8.6* 8.4*  MG  --   --   --  1.3* 1.6*   Liver Function Tests: Recent Labs  Lab 12/17/20 1904 12/19/20 0447 12/20/20 0659  AST 16 16 13*  ALT 8 8 7   ALKPHOS 93 86 74  BILITOT 0.3 0.4 0.5  PROT 6.4* 6.8 6.3*  ALBUMIN 3.2* 3.1* 3.1*    No results for input(s): LIPASE, AMYLASE in the last 168 hours. No results for input(s): AMMONIA in the last 168 hours. CBC: Recent Labs  Lab 12/17/20 1904 12/17/20 2117 12/18/20 0433 12/19/20 0447 12/20/20 0659  WBC 15.9* 14.2* 9.7 8.2 6.3  NEUTROABS 15.3*  --  8.1*  --   --   HGB 8.7* 7.8* 7.8* 8.9* 8.9*  HCT 27.3* 24.4* 24.6* 27.1* 27.0*  MCV 105.0* 104.3* 105.6* 101.9* 101.5*  PLT 169 143* 137* 133* 126*   Cardiac Enzymes: No results for input(s): CKTOTAL, CKMB, CKMBINDEX, TROPONINI in the last 168 hours. BNP: Invalid input(s): POCBNP CBG: Recent Labs  Lab 12/17/20 1755 12/18/20 0949  GLUCAP 129* 90   D-Dimer Recent Labs    12/18/20 0802  DDIMER 2.26*   Hgb A1c No results for input(s): HGBA1C in the last 72 hours. Lipid Profile No results for input(s): CHOL, HDL, LDLCALC, TRIG, CHOLHDL, LDLDIRECT in the last 72 hours. Thyroid function studies No results for input(s): TSH, T4TOTAL, T3FREE, THYROIDAB in the last 72 hours.  Invalid input(s): FREET3 Anemia work up No results for input(s): VITAMINB12, FOLATE, FERRITIN, TIBC, IRON, RETICCTPCT in the last 72 hours. Urinalysis    Component Value Date/Time   COLORURINE YELLOW 12/17/2020 2200   APPEARANCEUR CLEAR 12/17/2020 2200   APPEARANCEUR Clear 10/08/2013 2007   LABSPEC 1.009 12/17/2020 2200   LABSPEC 1.003 10/08/2013 2007   PHURINE 6.0 12/17/2020 2200   GLUCOSEU NEGATIVE 12/17/2020 2200   GLUCOSEU Negative 10/08/2013 2007   HGBUR NEGATIVE 12/17/2020 2200   BILIRUBINUR NEGATIVE 12/17/2020 2200   BILIRUBINUR Negative 10/08/2013 2007   KETONESUR NEGATIVE 12/17/2020 2200   PROTEINUR NEGATIVE 12/17/2020 2200   NITRITE NEGATIVE 12/17/2020 [place] 12/17/2020 2200   LEUKOCYTESUR Negative 10/08/2013 2007   Sepsis Labs Invalid input(s): PROCALCITONIN,  WBC,  LACTICIDVEN Microbiology Recent Results (from the past 240 hour(s))  Blood Culture (routine x 2)     Status: None (Preliminary  result)   Collection Time: 12/17/20  7:04 PM   Specimen: BLOOD RIGHT FOREARM  Result Value Ref Range Status   Specimen Description BLOOD RIGHT FOREARM  Final   Special Requests   Final    BOTTLES DRAWN AEROBIC AND ANAEROBIC Blood Culture adequate volume   Culture   Final    NO GROWTH 3 DAYS Performed at [facility], [address] [place], [place] [address]    Report Status PENDING  Incomplete  SARS Coronavirus 2 by RT PCR (hospital order, performed in [place] hospital lab) Nasopharyngeal Nasopharyngeal Swab     Status: None   Collection Time: 12/17/20  7:05 PM   Specimen: Nasopharyngeal Swab  Result Value Ref Range Status   SARS Coronavirus 2 NEGATIVE NEGATIVE Final    Comment: (NOTE) SARS-CoV-2 target nucleic acids are NOT DETECTED.  The SARS-CoV-2 RNA is generally detectable in upper and lower respiratory specimens during the acute phase of infection. The lowest concentration of SARS-CoV-2 viral copies this assay can detect is 250 copies / mL. A negative result does not preclude SARS-CoV-2 infection and should not be used as the sole basis for treatment or other patient management decisions.  A negative result may occur with improper specimen collection / handling, submission of specimen other than nasopharyngeal swab, presence of viral mutation(s) within the areas targeted by this assay, and inadequate number of viral copies (<250 copies / mL). A negative result must be combined with clinical observations, patient history, and epidemiological information.  Fact Sheet for Patients:   StrictlyIdeas.no  Fact Sheet for Healthcare Providers: BankingDealers.co.za  This test is not yet approved or  cleared by the [place] FDA and has been authorized for detection and/or diagnosis of SARS-CoV-2 by FDA under an Emergency Use Authorization (EUA).  This EUA will remain in effect (meaning this test can be used) for the duration  of the COVID-19 declaration under Section 564(b)(1) of the Act, 21 U.S.C. section 360bbb-3(b)(1), unless the authorization is terminated or revoked sooner.  Performed at [facility], [address], [place], [place] [address]   Blood Culture (routine x 2)     Status: None (Preliminary result)   Collection Time: 12/17/20  7:05 PM   Specimen: BLOOD  Result Value Ref Range Status   Specimen Description BLOOD BLOOD LEFT WRIST  Final   Special Requests   Final    BOTTLES DRAWN AEROBIC AND ANAEROBIC Blood Culture results may not be optimal due to an excessive volume of blood received in culture bottles   Culture   Final    NO GROWTH 3 DAYS Performed at [facility], [address], [place], [place] [address]    Report Status PENDING  Incomplete  Urine culture     Status: None   Collection Time: 12/17/20 10:00 PM   Specimen: Urine, Clean Catch  Result Value Ref Range Status   Specimen Description   Final    URINE, CLEAN CATCH Performed at [facility], [address], [place], [place] [address]    Special Requests   Final    NONE Performed at [facility], [address] [place], [place] [address]    Culture   Final    NO GROWTH Performed at [facility], [place] [address], [place], [place] [address]    Report Status 12/19/2020 FINAL  Final  MRSA PCR Screening     Status: None   Collection Time:  12/19/20  2:03 PM   Specimen: Nasopharyngeal  Result Value Ref Range Status   MRSA by PCR NEGATIVE NEGATIVE Final    Comment:        The GeneXpert MRSA Assay (FDA approved for NASAL specimens only), is one component of a comprehensive MRSA colonization surveillance program. It is not intended to diagnose MRSA infection nor to guide or monitor treatment for MRSA infections. Performed at [facility], [address] [place], [place] [address]    Time coordinating discharge: 36 mins   SIGNED:  Irwin Brakeman, MD  [facility] 12/20/2020, 11:01 AM How to contact the  [facility] Attending or Consulting provider [place] or covering provider during after hours [place], for this patient?  1. Check the care team in [facility] and look for a) attending/consulting TR[facility] provider listed and b) the [facility] team listed 2. Log into [email or web address] and use [place]'s universal password to access. If you do not have the password, please contact the hospital operator. 3. Locate the [facility] provider you are looking for under [facility] and page to a number that you can be directly reached. 4. If you still have difficulty reaching the provider, please page the [facility] (Director on Call) for the Hospitalists listed on amion for assistance.

## 2020-12-20 NOTE — TOC Transition Note (Signed)
Transition of Care [facility]) - CM/SW Discharge Note   Patient Details  Name: [name] MRN: [id number] Date of Birth: 1956/01/16  Transition of Care [facility] CM/SW Contact:  Shade Flood, LCSW Phone Number: 12/20/2020, 1:28 PM   Clinical Narrative:     Pt stable for dc today per MD. [facility] RN and PT orders placed. Spoke with pt to review. She is agreeable to [facility] services. Reviewed provider options and referred to [facility] at pt's request.   Alvis Lemmings will follow up with pt at home. Updated RN. There are no other TOC needs for dc.  Final next level of care: [place] Barriers to Discharge: Barriers Resolved   Patient Goals and CMS Choice Patient states their goals for this hospitalization and ongoing recovery are:: go home CMS Medicare.gov Compare Post Acute Care list provided to:: Patient Choice offered to / list presented to : Patient  Discharge Placement                       Discharge Plan and Services In-house Referral: Clinical Social Work   Post Acute Care Choice: Home Health                    HH Arranged: RN,PT [facility] Agency: [place] Date [place]: 12/20/20   Representative spoke with at [place]: [place] ([place]) Interventions     Readmission Risk Interventions Readmission Risk Prevention Plan 12/20/2020  Transportation Screening Complete  HRI or Home Care Consult Complete  Social Work Consult for [place] Planning/Counseling Complete  Palliative Care Screening Not Applicable  Medication Review Press photographer) Complete  Some recent data might be hidden

## 2020-12-20 NOTE — Progress Notes (Signed)
Dressed with assistance. O2 applied with portable home oxygen. Belonging gathered. D/C to home via w/c in good condition. Belongings given to Lucky and he placed in car.

## 2020-12-20 NOTE — Progress Notes (Signed)
Discharge instructions and appointment dates/times reviewed with Billy, (significant other). Voiced understanding.

## 2020-12-22 LAB — CULTURE, BLOOD (ROUTINE X 2)
Culture: NO GROWTH
Culture: NO GROWTH
Special Requests: ADEQUATE

## 2021-01-03 ENCOUNTER — Ambulatory Visit (HOSPITAL_COMMUNITY): Payer: Medicare HMO

## 2021-01-03 ENCOUNTER — Inpatient Hospital Stay (HOSPITAL_BASED_OUTPATIENT_CLINIC_OR_DEPARTMENT_OTHER): Payer: Medicare HMO | Admitting: Hematology

## 2021-01-03 ENCOUNTER — Inpatient Hospital Stay (HOSPITAL_COMMUNITY): Payer: Medicare HMO

## 2021-01-03 ENCOUNTER — Other Ambulatory Visit: Payer: Self-pay

## 2021-01-03 ENCOUNTER — Inpatient Hospital Stay (HOSPITAL_COMMUNITY): Payer: Medicare HMO | Attending: Hematology

## 2021-01-03 VITALS — BP 137/58 | HR 90 | Temp 97.2°F | Resp 20 | Wt 120.2 lb

## 2021-01-03 VITALS — BP 114/57 | HR 85 | Temp 97.4°F | Resp 18

## 2021-01-03 DIAGNOSIS — Z95828 Presence of other vascular implants and grafts: Secondary | ICD-10-CM

## 2021-01-03 DIAGNOSIS — J449 Chronic obstructive pulmonary disease, unspecified: Secondary | ICD-10-CM | POA: Diagnosis not present

## 2021-01-03 DIAGNOSIS — Z800 Family history of malignant neoplasm of digestive organs: Secondary | ICD-10-CM | POA: Insufficient documentation

## 2021-01-03 DIAGNOSIS — I10 Essential (primary) hypertension: Secondary | ICD-10-CM | POA: Insufficient documentation

## 2021-01-03 DIAGNOSIS — Z79899 Other long term (current) drug therapy: Secondary | ICD-10-CM | POA: Diagnosis not present

## 2021-01-03 DIAGNOSIS — Z5111 Encounter for antineoplastic chemotherapy: Secondary | ICD-10-CM | POA: Diagnosis present

## 2021-01-03 DIAGNOSIS — Z5189 Encounter for other specified aftercare: Secondary | ICD-10-CM | POA: Insufficient documentation

## 2021-01-03 DIAGNOSIS — Z87891 Personal history of nicotine dependence: Secondary | ICD-10-CM | POA: Insufficient documentation

## 2021-01-03 DIAGNOSIS — C786 Secondary malignant neoplasm of retroperitoneum and peritoneum: Secondary | ICD-10-CM

## 2021-01-03 DIAGNOSIS — E7800 Pure hypercholesterolemia, unspecified: Secondary | ICD-10-CM | POA: Insufficient documentation

## 2021-01-03 DIAGNOSIS — Z8249 Family history of ischemic heart disease and other diseases of the circulatory system: Secondary | ICD-10-CM | POA: Insufficient documentation

## 2021-01-03 DIAGNOSIS — C569 Malignant neoplasm of unspecified ovary: Secondary | ICD-10-CM | POA: Insufficient documentation

## 2021-01-03 DIAGNOSIS — Z8042 Family history of malignant neoplasm of prostate: Secondary | ICD-10-CM | POA: Insufficient documentation

## 2021-01-03 DIAGNOSIS — Z7982 Long term (current) use of aspirin: Secondary | ICD-10-CM | POA: Diagnosis not present

## 2021-01-03 LAB — CBC WITH DIFFERENTIAL/PLATELET
Abs Immature Granulocytes: 0.02 10*3/uL (ref 0.00–0.07)
Basophils Absolute: 0 10*3/uL (ref 0.0–0.1)
Basophils Relative: 1 %
Eosinophils Absolute: 0 10*3/uL (ref 0.0–0.5)
Eosinophils Relative: 0 %
HCT: 24.6 % — ABNORMAL LOW (ref 36.0–46.0)
Hemoglobin: 7.8 g/dL — ABNORMAL LOW (ref 12.0–15.0)
Immature Granulocytes: 1 %
Lymphocytes Relative: 24 %
Lymphs Abs: 0.9 10*3/uL (ref 0.7–4.0)
MCH: 33.3 pg (ref 26.0–34.0)
MCHC: 31.7 g/dL (ref 30.0–36.0)
MCV: 105.1 fL — ABNORMAL HIGH (ref 80.0–100.0)
Monocytes Absolute: 0.4 10*3/uL (ref 0.1–1.0)
Monocytes Relative: 11 %
Neutro Abs: 2.5 10*3/uL (ref 1.7–7.7)
Neutrophils Relative %: 63 %
Platelets: 148 10*3/uL — ABNORMAL LOW (ref 150–400)
RBC: 2.34 MIL/uL — ABNORMAL LOW (ref 3.87–5.11)
RDW: 15.9 % — ABNORMAL HIGH (ref 11.5–15.5)
WBC: 3.9 10*3/uL — ABNORMAL LOW (ref 4.0–10.5)
nRBC: 0 % (ref 0.0–0.2)

## 2021-01-03 LAB — COMPREHENSIVE METABOLIC PANEL
ALT: 7 U/L (ref 0–44)
AST: 16 U/L (ref 15–41)
Albumin: 3.5 g/dL (ref 3.5–5.0)
Alkaline Phosphatase: 61 U/L (ref 38–126)
Anion gap: 7 (ref 5–15)
BUN: 10 mg/dL (ref 8–23)
CO2: 29 mmol/L (ref 22–32)
Calcium: 8.5 mg/dL — ABNORMAL LOW (ref 8.9–10.3)
Chloride: 100 mmol/L (ref 98–111)
Creatinine, Ser: 0.44 mg/dL (ref 0.44–1.00)
GFR, Estimated: >60 mL/min (ref >60)
Glucose, Bld: 95 mg/dL (ref 70–99)
Potassium: 3.4 mmol/L — ABNORMAL LOW (ref 3.5–5.1)
Sodium: 136 mmol/L (ref 135–145)
Total Bilirubin: 0.3 mg/dL (ref 0.3–1.2)
Total Protein: 6.1 g/dL — ABNORMAL LOW (ref 6.5–8.1)

## 2021-01-03 LAB — MAGNESIUM: Magnesium: 1.6 mg/dL — ABNORMAL LOW (ref 1.7–2.4)

## 2021-01-03 MED ORDER — FAMOTIDINE IN NACL 20-0.9 MG/50ML-% IV SOLN
20.0000 mg | Freq: Once | INTRAVENOUS | Status: AC
  Administered 2021-01-03: 20 mg via INTRAVENOUS

## 2021-01-03 MED ORDER — SODIUM CHLORIDE 0.9 % IV SOLN
140.0000 mg/m2 | Freq: Once | INTRAVENOUS | Status: AC
  Administered 2021-01-03: 216 mg via INTRAVENOUS
  Filled 2021-01-03: qty 36

## 2021-01-03 MED ORDER — SODIUM CHLORIDE 0.9 % IV SOLN
10.0000 mg | Freq: Once | INTRAVENOUS | Status: AC
  Administered 2021-01-03: 10 mg via INTRAVENOUS
  Filled 2021-01-03: qty 10

## 2021-01-03 MED ORDER — DIPHENHYDRAMINE HCL 50 MG/ML IJ SOLN
INTRAMUSCULAR | Status: AC
  Filled 2021-01-03: qty 1

## 2021-01-03 MED ORDER — PALONOSETRON HCL INJECTION 0.25 MG/5ML
INTRAVENOUS | Status: AC
  Filled 2021-01-03: qty 5

## 2021-01-03 MED ORDER — SODIUM CHLORIDE 0.9 % IV SOLN: Freq: Once | INTRAVENOUS | Status: AC

## 2021-01-03 MED ORDER — FAMOTIDINE IN NACL 20-0.9 MG/50ML-% IV SOLN
INTRAVENOUS | Status: AC
  Filled 2021-01-03: qty 50

## 2021-01-03 MED ORDER — SODIUM CHLORIDE 0.9% FLUSH
10.0000 mL | INTRAVENOUS | Status: DC | PRN
  Administered 2021-01-03: 10 mL

## 2021-01-03 MED ORDER — PALONOSETRON HCL INJECTION 0.25 MG/5ML
0.2500 mg | Freq: Once | INTRAVENOUS | Status: AC
  Administered 2021-01-03: 0.25 mg via INTRAVENOUS

## 2021-01-03 MED ORDER — SODIUM CHLORIDE 0.9 % IV SOLN
150.0000 mg | Freq: Once | INTRAVENOUS | Status: AC
  Administered 2021-01-03: 150 mg via INTRAVENOUS
  Filled 2021-01-03: qty 5

## 2021-01-03 MED ORDER — MAGNESIUM SULFATE 2 GM/50ML IV SOLN
2.0000 g | Freq: Once | INTRAVENOUS | Status: AC
  Administered 2021-01-03: 2 g via INTRAVENOUS
  Filled 2021-01-03: qty 50

## 2021-01-03 MED ORDER — MAGNESIUM OXIDE 400 (241.3 MG) MG PO TABS: 400.0000 mg | ORAL_TABLET | Freq: Every day | ORAL | 0 refills | Status: DC

## 2021-01-03 MED ORDER — DIPHENHYDRAMINE HCL 50 MG/ML IJ SOLN
50.0000 mg | Freq: Once | INTRAMUSCULAR | Status: AC
  Administered 2021-01-03: 50 mg via INTRAVENOUS

## 2021-01-03 MED ORDER — POTASSIUM CHLORIDE CRYS ER 20 MEQ PO TBCR
20.0000 meq | EXTENDED_RELEASE_TABLET | Freq: Once | ORAL | Status: AC
  Administered 2021-01-03: 20 meq via ORAL
  Filled 2021-01-03: qty 1

## 2021-01-03 MED ORDER — HEPARIN SOD (PORK) LOCK FLUSH 100 UNIT/ML IV SOLN
500.0000 [IU] | Freq: Once | INTRAVENOUS | Status: AC | PRN
  Administered 2021-01-03: 500 [IU]

## 2021-01-03 MED ORDER — SODIUM CHLORIDE 0.9 % IV SOLN
538.8000 mg | Freq: Once | INTRAVENOUS | Status: AC
  Administered 2021-01-03: 540 mg via INTRAVENOUS
  Filled 2021-01-03: qty 54

## 2021-01-03 NOTE — Progress Notes (Signed)
[name], [name]   CLINIC:  Medical Oncology/Hematology  PCP:  [name], [place] / [place] [address] [phone]   REASON FOR VISIT:  Follow-up for high-grade serous ovarian carcinoma  PRIOR THERAPY: None  NGS Results: BRCA 1/2 negative  CURRENT THERAPY: Carboplatin, paclitaxel & Aloxi every 3 weeks  BRIEF ONCOLOGIC HISTORY:  Oncology History  Peritoneal carcinomatosis ([place])  06/06/2020 Initial Diagnosis   Peritoneal carcinomatosis ([place])   07/05/2020 -  Chemotherapy    Patient is on Treatment Plan: OVARIAN CARBOPLATIN (AUC 6) / PACLITAXEL (175) Q21D X 6 CYCLES      07/22/2020 Genetic Testing   Negative genetic testing: no pathogenic variants detected in [facility] Expanded CancerNext Panel + RNAInsight. The CancerNext-Expanded gene panel offered by [facility] and includes sequencing and rearrangement analysis for the following 77 genes: AIP, ALK, APC*, ATM*, AXIN2, BAP1, BARD1, BLM, BMPR1A, BRCA1*, BRCA2*, BRIP1*, CDC73, CDH1*, CDK4, CDKN1B, CDKN2A, CHEK2*, CTNNA1, DICER1, FANCC, FH, FLCN, GALNT12, KIF1B, LZTR1, MAX, MEN1, MET, MLH1*, MSH2*, MSH3, MSH6*, MUTYH*, NBN, NF1*, NF2, NTHL1, PALB2*, PHOX2B, PMS2*, POT1, PRKAR1A, PTCH1, PTEN*, RAD51C*, RAD51D*, RB1, RECQL, RET, SDHA, SDHAF2, SDHB, SDHC, SDHD, SMAD4, SMARCA4, SMARCB1, SMARCE1, STK11, SUFU, TMEM127, TP53*, TSC1, TSC2, VHL and XRCC2 (sequencing and deletion/duplication); EGFR, EGLN1, HOXB13, KIT, MITF, PDGFRA, POLD1, and POLE (sequencing only); EPCAM and GREM1 (deletion/duplication only). DNA and RNA analyses performed for * genes. The report date is July 22, 2020.      CANCER STAGING: Cancer Staging No matching staging information was found for the patient.  INTERVAL HISTORY:  [name], a 65 y.o. female, returns for routine follow-up and consideration for next cycle of chemotherapy. [name] was last seen on 12/13/2020.  Due for cycle #9  of carboplatin, paclitaxel and Aloxi today.   Overall, she tells me she has been feeling pretty well. She was brought to [facility] on 01/31 after having a syncopal episode after falling out of bed, after which her boyfriend called EMS and had her brought to [facility]. She was taking Levaquin for 48 hours PTA. Her breathing has improved since coming out of the hospital and her cough has become dry. She tolerated the previous treatment well and denies having any numbness or tingling. Her appetite is excellent.   Overall, she feels ready for next cycle of chemo today.    REVIEW OF SYSTEMS:  Review of Systems  Constitutional: Positive for fatigue (75%). Negative for appetite change.  Respiratory: Positive for cough (stable) and shortness of breath (improving).   Neurological: Negative for numbness.    PAST MEDICAL/SURGICAL HISTORY:  Past Medical History:  Diagnosis Date  . Anemia   . Asthma   . Cancer [facility])    Gastric Cancer  . COPD (chronic obstructive pulmonary disease) ([place])   . High cholesterol   . Hypertension   . Neuropathy   . Pneumonia 2019  . Port-A-Cath in place 07/03/2020   Past Surgical History:  Procedure Laterality Date  . HALLUX VALGUS BASE WEDGE Right 06/09/2015   Procedure: Base wedge osteotomy with modified McBride right foot ;  Surgeon: Sharlotte Alamo, MD;  Location: [facility] ORS;  Service: Podiatry;  Laterality: Right;  . PORTACATH PLACEMENT Left 06/28/2020   Procedure: PORT-A-CATHETER PLACEMENT LEFT CHEST (attached catheter in left subclavian);  Surgeon: Virl Cagey, MD;  Location: [facility] ORS;  Service: General;  Laterality: Left;    SOCIAL HISTORY:  Social History   Socioeconomic History  .  Marital status: Divorced    Spouse name: Not on file  . Number of children: 3  . Years of education: Not on file  . Highest education level: Not on file  Occupational History  . Occupation: DISABLED  Tobacco Use  . Smoking status: Former Smoker    Packs/day: 2.00    Years: 30.00     Pack years: 60.00    Types: Cigarettes    Quit date: 11/04/2013    Years since quitting: 7.1  . Smokeless tobacco: Never Used  Vaping Use  . Vaping Use: Never used  Substance and Sexual Activity  . Alcohol use: No  . Drug use: No  . Sexual activity: Not Currently  Other Topics Concern  . Not on file  Social History Narrative  . Not on file   Social Determinants of Health   Financial Resource Strain: Low Risk   . Difficulty of Paying Living Expenses: Not hard at all  Food Insecurity: No Food Insecurity  . Worried About Charity fundraiser in the Last Year: Never true  . Ran Out of Food in the Last Year: Never true  Transportation Needs: No Transportation Needs  . Lack of Transportation (Medical): No  . Lack of Transportation (Non-Medical): No  Physical Activity: Inactive  . Days of Exercise per Week: 0 days  . Minutes of Exercise per Session: 0 min  Stress: No Stress Concern Present  . Feeling of Stress : Not at all  Social Connections: Socially Isolated  . Frequency of Communication with Friends and Family: More than three times a week  . Frequency of Social Gatherings with Friends and Family: Never  . Attends Religious Services: Never  . Active Member of Clubs or Organizations: No  . Attends Archivist Meetings: Never  . Marital Status: Divorced  Human resources officer Violence: Not At Risk  . Fear of Current or Ex-Partner: No  . Emotionally Abused: No  . Physically Abused: No  . Sexually Abused: No    FAMILY HISTORY:  Family History  Problem Relation Age of Onset  . Alzheimer's disease Mother   . COPD Father   . Emphysema Father   . Hypertension Father   . Healthy Sister   . Healthy Brother   . Alzheimer's disease Maternal Grandmother   . Healthy Sister   . Healthy Sister   . Healthy Sister   . Prostate cancer Other        paternal grandmother's brother; dx in early 32s  . Breast cancer Neg Hx     CURRENT MEDICATIONS:  Current Outpatient  Medications  Medication Sig Dispense Refill  . acetylcysteine (MUCOMYST) 20 % nebulizer solution Inhale into the lungs.     Marland Kitchen amLODipine (NORVASC) 5 MG tablet Take 5 mg by mouth at bedtime.     Marland Kitchen aspirin EC 81 MG tablet Take 81 mg by mouth daily.    Marland Kitchen CARBOPLATIN IV Inject into the vein every 21 ( twenty-one) days.    . ferrous sulfate 325 (65 FE) MG tablet Take 325 mg by mouth every other day.    . Fluticasone-Umeclidin-Vilant (TRELEGY ELLIPTA) 100-62.5-25 MCG/INH AEPB Inhale 1 puff into the lungs 2 (two) times daily.    Marland Kitchen gabapentin (NEURONTIN) 300 MG capsule Take 300 mg by mouth 3 (three) times daily.     Marland Kitchen ipratropium (ATROVENT HFA) 17 MCG/ACT inhaler Inhale 2 puffs into the lungs every 6 (six) hours as needed for wheezing.     Marland Kitchen ipratropium-albuterol (DUONEB) 0.5-2.5 (3)  MG/3ML SOLN Take 0.5 mg by nebulization every 6 (six) hours as needed.    . levalbuterol (XOPENEX) 1.25 MG/3ML nebulizer solution Inhale into the lungs.    . nortriptyline (PAMELOR) 25 MG capsule Take 25 mg by mouth at bedtime.    . ondansetron (ZOFRAN ODT) 4 MG disintegrating tablet 80m ODT q4 hours prn nausea/vomit (Patient taking differently: Take 4 mg by mouth every 4 (four) hours as needed for nausea or vomiting.) 12 tablet 0  . OXYGEN Inhale 3 L into the lungs continuous.     .Marland KitchenPACLITAXEL IV Inject into the vein every 21 ( twenty-one) days.    . pantoprazole (PROTONIX) 40 MG tablet Take 1 tablet (40 mg total) by mouth daily. 30 tablet 2  . predniSONE (DELTASONE) 5 MG tablet Take 5 mg by mouth daily with breakfast.     . prochlorperazine (COMPAZINE) 10 MG tablet Take 1 tablet (10 mg total) by mouth every 6 (six) hours as needed (Nausea or vomiting). 30 tablet 1  . simvastatin (ZOCOR) 40 MG tablet Take 40 mg by mouth at bedtime.    .Marland Kitchenalbuterol (PROVENTIL HFA;VENTOLIN HFA) 108 (90 BASE) MCG/ACT inhaler Inhale 4-6 puffs into the lungs every 6 (six) hours as needed for wheezing or shortness of breath.  (Patient not taking:  Reported on 01/03/2021)     No current facility-administered medications for this visit.    ALLERGIES:  No Known Allergies  PHYSICAL EXAM:  Performance status (ECOG): 1 - Symptomatic but completely ambulatory  Vitals:   01/03/21 0819  BP: (!) 137/58  Pulse: 90  Resp: 20  Temp: (!) 97.2 F (36.2 C)  SpO2: 99%   Wt Readings from Last 3 Encounters:  01/03/21 120 lb 3.2 oz (54.5 kg)  12/17/20 118 lb (53.5 kg)  12/15/20 119 lb 6.4 oz (54.2 kg)   Physical Exam Vitals reviewed.  Constitutional:      Appearance: Normal appearance.     Interventions: Nasal cannula in place.  Cardiovascular:     Rate and Rhythm: Normal rate and regular rhythm.     Pulses: Normal pulses.     Heart sounds: Normal heart sounds.  Pulmonary:     Effort: Pulmonary effort is normal.     Breath sounds: Normal breath sounds.  Chest:     Comments: Port-a-Cath in L chest Neurological:     General: No focal deficit present.     Mental Status: She is alert and oriented to person, place, and time.  Psychiatric:        Mood and Affect: Mood normal.        Behavior: Behavior normal.     LABORATORY DATA:  I have reviewed the labs as listed.  CBC Latest Ref Rng & Units 01/03/2021 12/20/2020 12/19/2020  WBC 4.0 - 10.5 K/uL 3.9(L) 6.3 8.2  Hemoglobin 12.0 - 15.0 g/dL 7.8(L) 8.9(L) 8.9(L)  Hematocrit 36.0 - 46.0 % 24.6(L) 27.0(L) 27.1(L)  Platelets 150 - 400 K/uL 148(L) 126(L) 133(L)   CMP Latest Ref Rng & Units 01/03/2021 12/20/2020 12/19/2020  Glucose 70 - 99 mg/dL 95 108(H) 109(H)  BUN 8 - 23 mg/dL 10 6(L) 9  Creatinine 0.44 - 1.00 mg/dL 0.44 0.40(L) 0.45  Sodium 135 - 145 mmol/L 136 127(L) 134(L)  Potassium 3.5 - 5.1 mmol/L 3.4(L) 3.3(L) 3.0(L)  Chloride 98 - 111 mmol/L 100 87(L) 91(L)  CO2 22 - 32 mmol/L 29 30 33(H)  Calcium 8.9 - 10.3 mg/dL 8.5(L) 8.4(L) 8.6(L)  Total Protein 6.5 - 8.1 g/dL  6.1(L) 6.3(L) 6.8  Total Bilirubin 0.3 - 1.2 mg/dL 0.3 0.5 0.4  Alkaline Phos 38 - 126 U/L 61 74 86  AST 15 - 41  U/L 16 13(L) 16  ALT 0 - 44 U/L '7 7 8  ' No results found for: CA125   DIAGNOSTIC IMAGING:  I have independently reviewed the scans and discussed with the patient. CT Head Wo Contrast  Result Date: 12/17/2020 CLINICAL DATA:  Syncopal episode resulting in a fall, hitting the back of her head. EXAM: CT HEAD WITHOUT CONTRAST TECHNIQUE: Contiguous axial images were obtained from the base of the skull through the vertex without intravenous contrast. COMPARISON:  None. FINDINGS: Brain: Normal appearing cerebral hemispheres and posterior fossa structures. Normal size and position of the ventricles. No intracranial hemorrhage, mass lesion or CT evidence of acute infarction. Vascular: No hyperdense vessel or unexpected calcification. Skull: Normal. Negative for fracture or focal lesion. Sinuses/Orbits: Unremarkable. Other: None. IMPRESSION: Normal examination. Electronically Signed   By: Claudie Revering M.D.   On: 12/17/2020 19:04   CT ANGIO CHEST PE W OR WO CONTRAST  Result Date: 12/18/2020 CLINICAL DATA:  Shortness of breath. Positive D-dimer. Undergoing chemotherapy for ovarian carcinoma. EXAM: CT ANGIOGRAPHY CHEST WITH CONTRAST TECHNIQUE: Multidetector CT imaging of the chest was performed using the standard protocol during bolus administration of intravenous contrast. Multiplanar CT image reconstructions and MIPs were obtained to evaluate the vascular anatomy. CONTRAST:  16m OMNIPAQUE IOHEXOL 350 MG/ML SOLN COMPARISON:  11/14/2020 FINDINGS: Cardiovascular: Satisfactory opacification of pulmonary arteries noted, and no pulmonary emboli identified. No evidence of thoracic aortic dissection or aneurysm. Aortic and coronary atherosclerotic calcification noted. Mediastinum/Nodes: No masses or pathologically enlarged lymph nodes identified. Lungs/Pleura: Moderate centrilobular emphysema again noted. New small bilateral pleural effusions and dependent atelectasis. New multifocal ill-defined nodular and airspace  opacities are seen in the medial and anterior left upper lobe, suspicious for infectious or inflammatory process. Upper abdomen: No acute findings. Musculoskeletal: No suspicious bone lesions identified. Review of the MIP images confirms the above findings. IMPRESSION: No evidence of pulmonary embolism. New multifocal ill-defined nodular and airspace opacities in left upper lobe, suspicious for infectious or inflammatory process. New small bilateral pleural effusions and dependent atelectasis, left side greater than right. Aortic Atherosclerosis (ICD10-I70.0) and Emphysema (ICD10-J43.9). Electronically Signed   By: JMarlaine HindM.D.   On: 12/18/2020 18:33   DG Chest Port 1 View  Result Date: 12/17/2020 CLINICAL DATA:  Questionable sepsis - evaluate for abnormality Syncope fall out of bed. EXAM: PORTABLE CHEST 1 VIEW COMPARISON:  Chest radiograph 10/16/2020.  Chest CT 11/14/2020 FINDINGS: Left-sided chest port in place. New patchy opacity in the suprahilar left upper lobe. There are streaky opacities in both lung bases. Stable heart size and mediastinal contours. No pneumothorax or pleural effusion. Remote left sixth rib fracture. No acute osseous abnormalities are seen. IMPRESSION: Patchy suprahilar left upper lobe opacity, new from CT last month, suspicious for pneumonia. Streaky bibasilar opacities, may be atelectasis or additional sites of pneumonia. Electronically Signed   By: MKeith RakeM.D.   On: 12/17/2020 19:07   EEG adult  Result Date: 12/19/2020 YLora Havens MD     12/19/2020 10:05 AM Patient Name: DGABBRIELLE MCNICHOLASMRN: [id number] Attending: PLora HavensReferring Physician/Provider: Dr. DShanon BrowTat Date: 12/18/2020 Duration: 23.41 mins Patient history: 65year old female with alteration of awareness.  EEG to evaluate for seizures. Level of alertness: Awake AEDs during EEG study: None Technical aspects: This EEG study was done with scalp  electrodes positioned according to the 10-20  International system of electrode placement. Electrical activity was acquired at a sampling rate of '500Hz'  and reviewed with a high frequency filter of '70Hz'  and a low frequency filter of '1Hz' . EEG data were recorded continuously and digitally stored. Description: The posterior dominant rhythm consists of 9 Hz activity of moderate voltage (25-35 uV) seen predominantly in posterior head regions, symmetric and reactive to eye opening and eye closing. Hyperventilation and photic stimulation were not performed.   IMPRESSION: This study is within normal limits. No seizures or epileptiform discharges were seen throughout the recording. Lora Havens   ECHOCARDIOGRAM COMPLETE  Result Date: 12/18/2020    ECHOCARDIOGRAM REPORT   Patient Name:   LUCELIA LACEY Date of Exam: 12/18/2020 Medical Rec #:  [id number]     Height:       60.0 in Accession #:    [id number]    Weight:       118.0 lb Date of Birth:  08/22/1956     BSA:          1.492 m Patient Age:    44 years      BP:           124/64 mmHg Patient Gender: F             HR:           96 bpm. Exam Location:  [facility] Procedure: 2D Echo Indications:    Syncope 780.2 / R55  History:        Patient has no prior history of Echocardiogram examinations.                 COPD; Risk Factors:Hypertension, Former Smoker and Dyslipidemia.                 Ovarian Cancer.  Sonographer:    Leavy Cella RDCS (AE) Referring Phys: [phone] DAVID TAT IMPRESSIONS  1. Left ventricular ejection fraction, by estimation, is 60 to 65%. The left ventricle has normal function. The left ventricle has no regional wall motion abnormalities. There is mild left ventricular hypertrophy. Left ventricular diastolic parameters are indeterminate.  2. Right ventricular systolic function is normal. The right ventricular size is normal. Tricuspid regurgitation signal is inadequate for assessing PA pressure.  3. The mitral valve is grossly normal. Trivial mitral valve regurgitation.  4. The aortic valve is  tricuspid. There is mild calcification of the aortic valve. Aortic valve regurgitation is not visualized. Mild to moderate aortic valve sclerosis/calcification is present, without any evidence of aortic stenosis.  5. The inferior vena cava is normal in size with greater than 50% respiratory variability, suggesting right atrial pressure of 3 mmHg. FINDINGS  Left Ventricle: Left ventricular ejection fraction, by estimation, is 60 to 65%. The left ventricle has normal function. The left ventricle has no regional wall motion abnormalities. The left ventricular internal cavity size was normal in size. There is  mild left ventricular hypertrophy. Left ventricular diastolic parameters are indeterminate. Right Ventricle: The right ventricular size is normal. No increase in right ventricular wall thickness. Right ventricular systolic function is normal. Tricuspid regurgitation signal is inadequate for assessing PA pressure. Left Atrium: Left atrial size was normal in size. Right Atrium: Right atrial size was normal in size. Pericardium: There is no evidence of pericardial effusion. Mitral Valve: The mitral valve is grossly normal. Trivial mitral valve regurgitation. Tricuspid Valve: The tricuspid valve is grossly normal. Tricuspid valve regurgitation is trivial. Aortic Valve: The aortic  valve is tricuspid. There is mild calcification of the aortic valve. There is mild to moderate aortic valve annular calcification. Aortic valve regurgitation is not visualized. Mild to moderate aortic valve sclerosis/calcification is present, without any evidence of aortic stenosis. Pulmonic Valve: The pulmonic valve was grossly normal. Pulmonic valve regurgitation is trivial. Aorta: The aortic root is normal in size and structure. Venous: The inferior vena cava is normal in size with greater than 50% respiratory variability, suggesting right atrial pressure of 3 mmHg. IAS/Shunts: No atrial level shunt detected by color flow Doppler.  LEFT  VENTRICLE PLAX 2D LVIDd:         3.72 cm  Diastology LVIDs:         2.40 cm  LV e' medial:    6.20 cm/s LV PW:         1.11 cm  LV E/e' medial:  20.3 LV IVS:        1.11 cm  LV e' lateral:   10.90 cm/s LVOT diam:     2.00 cm  LV E/e' lateral: 11.6 LVOT Area:     3.14 cm  RIGHT VENTRICLE RV S prime:     16.30 cm/s TAPSE (M-mode): 2.3 cm LEFT ATRIUM             Index       RIGHT ATRIUM           Index LA diam:        3.00 cm 2.01 cm/m  RA Area:     14.20 cm LA Vol (A2C):   31.8 ml 21.32 ml/m RA Volume:   37.00 ml  24.80 ml/m LA Vol (A4C):   31.5 ml 21.12 ml/m LA Biplane Vol: 31.6 ml 21.18 ml/m   AORTA Ao Root diam: 3.30 cm MITRAL VALVE MV Area (PHT): 3.83 cm     SHUNTS MV Decel Time: 198 msec     Systemic Diam: 2.00 cm MV E velocity: 126.00 cm/s MV A velocity: 116.00 cm/s MV E/A ratio:  1.09 Rozann Lesches MD Electronically signed by Rozann Lesches MD Signature Date/Time: 12/18/2020/10:34:03 AM    Final      ASSESSMENT:  1. Peritoneal carcinomatosis: -Presentation to the ER with right lower quadrant abdominal pain on and off for 1 month. -CTAP on 05/29/2020 showed extensive nodularity throughout the omentum and upper peritoneal cavity. Both ovaries are prominent although no well-defined mass is noted.Small volume ascites. -CA-125 is elevated at 2407. CEA was 2.4. -CT of the chest shows 11 mm right paratracheal lymph node and 11 mm short axis precarinal lymph node. Subcarinal lymphadenopathy measuring 14 mm short axis. This is suspicious for metastatic disease. 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 -Cycle 1 of carboplatin and paclitaxel on 07/05/2020. -Evaluated by Dr. Denman George on 07/07/2020. Reevaluation after 3 cycles. -CT CAP from 09/01/2020 showed mild improvement with reduction in the omental caking. Stable to minimally reduced  adenopathy in the chest and abdomen. New 4 mm right upper lobe lung nodule could be inflammatory. -CT CAP on 11/07/2019 and after 6 cycles showed improved peritoneal disease/omental caking. 9 mm short axis portacaval lymph node and adjacent upper abdominal lymph nodes measuring 12 mm in short axis. Small retroperitoneal lymph nodes up to 5 mm improved. Peritoneal disease/omental caking now measures 2.4 cm in thickness. -CA-125 on 10/18/2020 improved 52. -Plan for surgical resection after 6 cycles.  Unfortunately she was not cleared for surgery  by anesthesia/pulmonary.  2. COPD: -Quit smoking 6 years ago. Smoked 2 packs/day for 25-30 years. -Has been on3 L/min oxygen via nasal cannulafor the last 5 years.  3. Family history: -Paternal grandmother with colon cancer.   PLAN:  1.High-grade serous ovarian carcinoma: -She was admitted to the hospital and was treated for pneumonia. -She is feeling much better and back to her energy levels. -Reviewed labs today.  Will replete potassium and magnesium. -We will proceed with her next cycle today. -RTC 3 weeks for follow-up.  2. Family history: -Germline mutation testing was negative.  3. Peripheral neuropathy: -Continue gabapentin 300 mg 3 times daily.  4.Severe COPD: -Continue prednisone, Trelegy and Xopenex.  5.  Hypomagnesemia: -Magnesium today is 1.6.  She will receive 2 g IV. -We will start her on magnesium oxide 400 mg daily.   Orders placed this encounter:  No orders of the defined types were placed in this encounter.    Derek Jack, MD [place] [phone]   I, Milinda Antis, am acting as a scribe for Dr. Sanda Linger.  I, Derek Jack MD, have reviewed the above documentation for accuracy and completeness, and I agree with the above.

## 2021-01-03 NOTE — Patient Instructions (Signed)
[facility] Discharge Instructions for Patients Receiving Chemotherapy  Today you received the following chemotherapy agents   To help prevent nausea and vomiting after your treatment, we encourage you to take your nausea medication   If you develop nausea and vomiting that is not controlled by your nausea medication, call the clinic.   BELOW ARE SYMPTOMS THAT SHOULD BE REPORTED IMMEDIATELY:  *FEVER GREATER THAN 100.5 F  *CHILLS WITH OR WITHOUT FEVER  NAUSEA AND VOMITING THAT IS NOT CONTROLLED WITH YOUR NAUSEA MEDICATION  *UNUSUAL SHORTNESS OF BREATH  *UNUSUAL BRUISING OR BLEEDING  TENDERNESS IN MOUTH AND THROAT WITH OR WITHOUT PRESENCE OF ULCERS  *URINARY PROBLEMS  *BOWEL PROBLEMS  UNUSUAL RASH Items with * indicate a potential emergency and should be followed up as soon as possible.  Feel free to call the clinic should you have any questions or concerns. The clinic phone number is [phone].  Please show the CHEMO ALERT CARD at check-in to the Emergency Department and triage nurse.   

## 2021-01-03 NOTE — Progress Notes (Signed)
Patient presents today for Taxol/Carbo infusions.  Vital signs within parameters for treatment.  Labs pending.  No new complaints since last treatment.  Labs within parameters for treatment.  2 grams Magnesium sulfate IV and 20 mEq of potassium PO ordered.  Treatment given today per MD orders.  Tolerated infusion without adverse affects.  Vital signs stable.  No complaints at this time.  Discharge from clinic ambulatory in stable condition.  Alert and oriented X 3.  Follow up with [facility] as scheduled.

## 2021-01-03 NOTE — Patient Instructions (Signed)
[place] at [facility] Discharge Instructions  You were seen today by [name]. He went over your recent results. You received your treatment today. You will be prescribed magnesium to take daily to improve your levels. [name] will see you back in 3 weeks for labs and follow up.   Thank you for choosing [place] at [facility] to provide your oncology and hematology care.  To afford each patient quality time with our provider, please arrive at least 15 minutes before your scheduled appointment time.   If you have a lab appointment with the [place] [facility] please come in thru the Main Entrance and check in at the main information desk  You need to re-schedule your appointment should you arrive 10 or more minutes late.  We strive to give you quality time with our providers, and arriving late affects you and other patients whose appointments are after yours.  Also, if you no show three or more times for appointments you may be dismissed from the clinic at the providers discretion.     Again, thank you for choosing [facility].  Our hope is that these requests will decrease the amount of time that you wait before being seen by our physicians.       _____________________________________________________________  Should you have questions after your visit to [facility], please contact our office at [phone] between the hours of 8:00 a.m. and 4:30 p.m.  Voicemails left after 4:00 p.m. will not be returned until the following business day.  For prescription refill requests, have your pharmacy contact our office and allow 72 hours.    Cancer Center Support Programs:   > Cancer Support Group  2nd Tuesday of the month 1pm-2pm, Journey Room

## 2021-01-03 NOTE — Progress Notes (Signed)
Patient was assessed by [name] and labs have been reviewed. Okay to proceed with treatment today. Dr. Raliegh Ip is also ordering 2 g IV magnesium and 20 meq po potassium. Magnesium 400 mg by mouth daily sent to pharmacy per Dr. Marthann Schiller order. Primary RN and pharmacy aware.

## 2021-01-04 LAB — CA 125: Cancer Antigen (CA) 125: 53.2 U/mL — ABNORMAL HIGH (ref 0.0–38.1)

## 2021-01-05 ENCOUNTER — Other Ambulatory Visit: Payer: Self-pay

## 2021-01-05 ENCOUNTER — Inpatient Hospital Stay (HOSPITAL_COMMUNITY): Payer: Medicare HMO

## 2021-01-05 ENCOUNTER — Ambulatory Visit (HOSPITAL_COMMUNITY): Payer: Medicare HMO

## 2021-01-05 VITALS — BP 96/54 | HR 94 | Temp 96.9°F | Resp 18 | Wt 115.1 lb

## 2021-01-05 DIAGNOSIS — C786 Secondary malignant neoplasm of retroperitoneum and peritoneum: Secondary | ICD-10-CM

## 2021-01-05 DIAGNOSIS — Z95828 Presence of other vascular implants and grafts: Secondary | ICD-10-CM

## 2021-01-05 DIAGNOSIS — Z5111 Encounter for antineoplastic chemotherapy: Secondary | ICD-10-CM | POA: Diagnosis not present

## 2021-01-05 MED ORDER — PEGFILGRASTIM-CBQV 6 MG/0.6ML ~~LOC~~ SOSY
6.0000 mg | PREFILLED_SYRINGE | Freq: Once | SUBCUTANEOUS | Status: AC
  Administered 2021-01-05: 6 mg via SUBCUTANEOUS

## 2021-01-05 MED ORDER — PEGFILGRASTIM-CBQV 6 MG/0.6ML ~~LOC~~ SOSY
PREFILLED_SYRINGE | SUBCUTANEOUS | Status: AC
  Filled 2021-01-05: qty 0.6

## 2021-01-05 NOTE — Progress Notes (Signed)
Patient tolerated Udencya injection with no complaints voiced.  Site clean and dry with no bruising or swelling noted.  No complaints of pain.  Discharged with vital signs stable and no signs or symptoms of distress noted.  

## 2021-01-05 NOTE — Progress Notes (Signed)
Nutrition Follow-up:  Patient with ovarian cancer.  Patient receiving chemotherapy.  Patient not a surgical candidate due to COPD.    Met with patient and boyfriend in clinic.  Noted hospital admission for pneumonia.  Patient reports that she is feeling better and appetite is good.  Some days she eats better than others.  Boyfriend does most of the cooking.     Medications: reviewed  Labs: reviewed  Anthropometrics:   Weight 120 lb 3.2 oz on 2/16 increased from 117 lb 11/6 oz on 1/5  115 lb on 12/1   NUTRITION DIAGNOSIS: Unintentional weight loss improving   INTERVENTION:  Continue high calorie, high protein foods     MONITORING, EVALUATION, GOAL: weight trends, intake   NEXT VISIT: March 11 after injection  Christena Sunderlin B. Zenia Resides, [place], [place] Registered Dietitian [phone] (mobile)

## 2021-01-24 ENCOUNTER — Inpatient Hospital Stay (HOSPITAL_COMMUNITY): Payer: Medicare HMO

## 2021-01-24 ENCOUNTER — Inpatient Hospital Stay (HOSPITAL_COMMUNITY): Payer: Medicare HMO | Attending: Hematology

## 2021-01-24 ENCOUNTER — Inpatient Hospital Stay (HOSPITAL_BASED_OUTPATIENT_CLINIC_OR_DEPARTMENT_OTHER): Payer: Medicare HMO | Admitting: Hematology

## 2021-01-24 ENCOUNTER — Other Ambulatory Visit: Payer: Self-pay

## 2021-01-24 VITALS — BP 141/53 | HR 96 | Temp 96.9°F | Resp 18 | Wt 117.9 lb

## 2021-01-24 VITALS — BP 124/66 | HR 85 | Temp 98.4°F | Resp 18

## 2021-01-24 DIAGNOSIS — C569 Malignant neoplasm of unspecified ovary: Secondary | ICD-10-CM

## 2021-01-24 DIAGNOSIS — Z5189 Encounter for other specified aftercare: Secondary | ICD-10-CM | POA: Insufficient documentation

## 2021-01-24 DIAGNOSIS — Z7982 Long term (current) use of aspirin: Secondary | ICD-10-CM | POA: Insufficient documentation

## 2021-01-24 DIAGNOSIS — C786 Secondary malignant neoplasm of retroperitoneum and peritoneum: Secondary | ICD-10-CM

## 2021-01-24 DIAGNOSIS — D649 Anemia, unspecified: Secondary | ICD-10-CM | POA: Diagnosis not present

## 2021-01-24 DIAGNOSIS — Z800 Family history of malignant neoplasm of digestive organs: Secondary | ICD-10-CM | POA: Diagnosis not present

## 2021-01-24 DIAGNOSIS — Z87891 Personal history of nicotine dependence: Secondary | ICD-10-CM | POA: Diagnosis not present

## 2021-01-24 DIAGNOSIS — Z79899 Other long term (current) drug therapy: Secondary | ICD-10-CM | POA: Diagnosis not present

## 2021-01-24 DIAGNOSIS — G629 Polyneuropathy, unspecified: Secondary | ICD-10-CM | POA: Diagnosis not present

## 2021-01-24 DIAGNOSIS — R918 Other nonspecific abnormal finding of lung field: Secondary | ICD-10-CM | POA: Diagnosis not present

## 2021-01-24 DIAGNOSIS — Z95828 Presence of other vascular implants and grafts: Secondary | ICD-10-CM

## 2021-01-24 DIAGNOSIS — J449 Chronic obstructive pulmonary disease, unspecified: Secondary | ICD-10-CM | POA: Insufficient documentation

## 2021-01-24 DIAGNOSIS — Z5111 Encounter for antineoplastic chemotherapy: Secondary | ICD-10-CM | POA: Insufficient documentation

## 2021-01-24 DIAGNOSIS — Z85028 Personal history of other malignant neoplasm of stomach: Secondary | ICD-10-CM | POA: Insufficient documentation

## 2021-01-24 DIAGNOSIS — I10 Essential (primary) hypertension: Secondary | ICD-10-CM | POA: Diagnosis not present

## 2021-01-24 LAB — CBC WITH DIFFERENTIAL/PLATELET
Abs Immature Granulocytes: 0.01 10*3/uL (ref 0.00–0.07)
Basophils Absolute: 0 10*3/uL (ref 0.0–0.1)
Basophils Relative: 1 %
Eosinophils Absolute: 0 10*3/uL (ref 0.0–0.5)
Eosinophils Relative: 1 %
HCT: 23.4 % — ABNORMAL LOW (ref 36.0–46.0)
Hemoglobin: 7.4 g/dL — ABNORMAL LOW (ref 12.0–15.0)
Immature Granulocytes: 0 %
Lymphocytes Relative: 33 %
Lymphs Abs: 1 10*3/uL (ref 0.7–4.0)
MCH: 33.8 pg (ref 26.0–34.0)
MCHC: 31.6 g/dL (ref 30.0–36.0)
MCV: 106.8 fL — ABNORMAL HIGH (ref 80.0–100.0)
Monocytes Absolute: 0.3 10*3/uL (ref 0.1–1.0)
Monocytes Relative: 10 %
Neutro Abs: 1.7 10*3/uL (ref 1.7–7.7)
Neutrophils Relative %: 55 %
Platelets: 112 10*3/uL — ABNORMAL LOW (ref 150–400)
RBC: 2.19 MIL/uL — ABNORMAL LOW (ref 3.87–5.11)
RDW: 17.1 % — ABNORMAL HIGH (ref 11.5–15.5)
WBC: 3.1 10*3/uL — ABNORMAL LOW (ref 4.0–10.5)
nRBC: 0 % (ref 0.0–0.2)

## 2021-01-24 LAB — COMPREHENSIVE METABOLIC PANEL
ALT: 12 U/L (ref 0–44)
AST: 19 U/L (ref 15–41)
Albumin: 3.7 g/dL (ref 3.5–5.0)
Alkaline Phosphatase: 67 U/L (ref 38–126)
Anion gap: 7 (ref 5–15)
BUN: 8 mg/dL (ref 8–23)
CO2: 31 mmol/L (ref 22–32)
Calcium: 8.8 mg/dL — ABNORMAL LOW (ref 8.9–10.3)
Chloride: 103 mmol/L (ref 98–111)
Creatinine, Ser: 0.5 mg/dL (ref 0.44–1.00)
GFR, Estimated: >60 mL/min (ref >60)
Glucose, Bld: 100 mg/dL — ABNORMAL HIGH (ref 70–99)
Potassium: 3.5 mmol/L (ref 3.5–5.1)
Sodium: 141 mmol/L (ref 135–145)
Total Bilirubin: 0.3 mg/dL (ref 0.3–1.2)
Total Protein: 6.3 g/dL — ABNORMAL LOW (ref 6.5–8.1)

## 2021-01-24 MED ORDER — FAMOTIDINE IN NACL 20-0.9 MG/50ML-% IV SOLN
INTRAVENOUS | Status: AC
  Filled 2021-01-24: qty 50

## 2021-01-24 MED ORDER — PALONOSETRON HCL INJECTION 0.25 MG/5ML
0.2500 mg | Freq: Once | INTRAVENOUS | Status: AC
  Administered 2021-01-24: 0.25 mg via INTRAVENOUS

## 2021-01-24 MED ORDER — SODIUM CHLORIDE 0.9 % IV SOLN
10.0000 mg | Freq: Once | INTRAVENOUS | Status: AC
  Administered 2021-01-24: 10 mg via INTRAVENOUS
  Filled 2021-01-24: qty 10

## 2021-01-24 MED ORDER — SODIUM CHLORIDE 0.9 % IV SOLN
150.0000 mg | Freq: Once | INTRAVENOUS | Status: AC
  Administered 2021-01-24: 150 mg via INTRAVENOUS
  Filled 2021-01-24: qty 5

## 2021-01-24 MED ORDER — SODIUM CHLORIDE 0.9 % IV SOLN
538.8000 mg | Freq: Once | INTRAVENOUS | Status: AC
  Administered 2021-01-24: 540 mg via INTRAVENOUS
  Filled 2021-01-24: qty 54

## 2021-01-24 MED ORDER — SODIUM CHLORIDE 0.9 % IV SOLN: Freq: Once | INTRAVENOUS | Status: AC

## 2021-01-24 MED ORDER — HEPARIN SOD (PORK) LOCK FLUSH 100 UNIT/ML IV SOLN
500.0000 [IU] | Freq: Once | INTRAVENOUS | Status: AC | PRN
  Administered 2021-01-24: 500 [IU]

## 2021-01-24 MED ORDER — SODIUM CHLORIDE 0.9% FLUSH
10.0000 mL | INTRAVENOUS | Status: DC | PRN
  Administered 2021-01-24: 10 mL

## 2021-01-24 MED ORDER — DIPHENHYDRAMINE HCL 50 MG/ML IJ SOLN
50.0000 mg | Freq: Once | INTRAMUSCULAR | Status: AC
  Administered 2021-01-24: 50 mg via INTRAVENOUS

## 2021-01-24 MED ORDER — PALONOSETRON HCL INJECTION 0.25 MG/5ML
INTRAVENOUS | Status: AC
  Filled 2021-01-24: qty 5

## 2021-01-24 MED ORDER — DIPHENHYDRAMINE HCL 50 MG/ML IJ SOLN
INTRAMUSCULAR | Status: AC
  Filled 2021-01-24: qty 1

## 2021-01-24 MED ORDER — SODIUM CHLORIDE 0.9 % IV SOLN
140.0000 mg/m2 | Freq: Once | INTRAVENOUS | Status: AC
  Administered 2021-01-24: 216 mg via INTRAVENOUS
  Filled 2021-01-24: qty 36

## 2021-01-24 MED ORDER — FAMOTIDINE IN NACL 20-0.9 MG/50ML-% IV SOLN
20.0000 mg | Freq: Once | INTRAVENOUS | Status: AC
  Administered 2021-01-24: 20 mg via INTRAVENOUS

## 2021-01-24 NOTE — Progress Notes (Signed)
Patient presents today for treatment and follow up visit with [name]. Labs pending. Vital signs within parameters for treatment. Patient denies any complaints today related to her chemotherapy. Patient denies pain today. Patient has numbness bilateral in her fingertips which she states has not increased in severity or affected her daily activities.   Message received from [place] LPN/ [name] to proceed with treatment today. HGB 7.4 today. MD aware. Labs reviewed.  Treatment given today per MD orders. Tolerated infusion without adverse affects. Vital signs stable. No complaints at this time. Discharged from clinic via wheel chair in stable condition. Alert and oriented x 3. F/U with [facility] as scheduled.

## 2021-01-24 NOTE — Progress Notes (Signed)
[name], [place] [address]   CLINIC:  Medical Oncology/Hematology  PCP:  [name], [place] / [place] [address] [phone]   REASON FOR VISIT:  Follow-up for high-grade serous ovarian carcinoma  PRIOR THERAPY: None  NGS Results: BRCA 1/2 negative  CURRENT THERAPY: Carboplatin, paclitaxel & Aloxi every 3 weeks  BRIEF ONCOLOGIC HISTORY:  Oncology History  Peritoneal carcinomatosis ([place])  06/06/2020 Initial Diagnosis   Peritoneal carcinomatosis ([place])   07/05/2020 -  Chemotherapy    Patient is on Treatment Plan: OVARIAN CARBOPLATIN (AUC 6) / PACLITAXEL (175) Q21D X 6 CYCLES      07/22/2020 Genetic Testing   Negative genetic testing: no pathogenic variants detected in [place] Expanded CancerNext Panel + RNAInsight. The CancerNext-Expanded gene panel offered by [facility] and includes sequencing and rearrangement analysis for the following 77 genes: AIP, ALK, APC*, ATM*, AXIN2, BAP1, BARD1, BLM, BMPR1A, BRCA1*, BRCA2*, BRIP1*, CDC73, CDH1*, CDK4, CDKN1B, CDKN2A, CHEK2*, CTNNA1, DICER1, FANCC, FH, FLCN, GALNT12, KIF1B, LZTR1, MAX, MEN1, MET, MLH1*, MSH2*, MSH3, MSH6*, MUTYH*, NBN, NF1*, NF2, NTHL1, PALB2*, PHOX2B, PMS2*, POT1, PRKAR1A, PTCH1, PTEN*, RAD51C*, RAD51D*, RB1, RECQL, RET, SDHA, SDHAF2, SDHB, SDHC, SDHD, SMAD4, SMARCA4, SMARCB1, SMARCE1, STK11, SUFU, TMEM127, TP53*, TSC1, TSC2, VHL and XRCC2 (sequencing and deletion/duplication); EGFR, EGLN1, HOXB13, KIT, MITF, PDGFRA, POLD1, and POLE (sequencing only); EPCAM and GREM1 (deletion/duplication only). DNA and RNA analyses performed for * genes. The report date is July 22, 2020.      CANCER STAGING: Cancer Staging No matching staging information was found for the patient.  INTERVAL HISTORY:  [name], a 65 y.o. female, returns for routine follow-up and consideration for next cycle of chemotherapy. [name] was last seen on 01/03/2021.  Due for cycle  #10 of carboplatin, paclitaxel and Aloxi today.   Today she is accompanied by her mother. Overall, she tells me she has been feeling pretty well. Her breathing is stable and she still uses the nasal cannula. She tolerated the previous treatment well. She denies having any abdominal pain and she continues taking gabapentin TID; she denies having any pain in her hands or feet. She has started magnesium and she continues taking prednisone 5 mg. She denies having any N/V/D. She is able to do her chores and ADL's.  Overall, she feels ready for next cycle of chemo today.    REVIEW OF SYSTEMS:  Review of Systems  Constitutional: Negative for appetite change and fatigue.  Respiratory: Positive for shortness of breath (stable).   Gastrointestinal: Negative for diarrhea.  All other systems reviewed and are negative.   PAST MEDICAL/SURGICAL HISTORY:  Past Medical History:  Diagnosis Date  . Anemia   . Asthma   . Cancer [facility])    Gastric Cancer  . COPD (chronic obstructive pulmonary disease) ([place])   . High cholesterol   . Hypertension   . Neuropathy   . Pneumonia 2019  . Port-A-Cath in place 07/03/2020   Past Surgical History:  Procedure Laterality Date  . HALLUX VALGUS BASE WEDGE Right 06/09/2015   Procedure: Base wedge osteotomy with modified McBride right foot ;  Surgeon: Vicki Alamo, MD;  Location: [facility] ORS;  Service: Podiatry;  Laterality: Right;  . PORTACATH PLACEMENT Left 06/28/2020   Procedure: PORT-A-CATHETER PLACEMENT LEFT CHEST (attached catheter in left subclavian);  Surgeon: Vicki Cagey, MD;  Location: [facility] ORS;  Service: General;  Laterality: Left;    SOCIAL HISTORY:  Social History   Socioeconomic History  .  Marital status: Divorced    Spouse name: Not on file  . Number of children: 3  . Years of education: Not on file  . Highest education level: Not on file  Occupational History  . Occupation: DISABLED  Tobacco Use  . Smoking status: Former Smoker    Packs/day:  2.00    Years: 30.00    Pack years: 60.00    Types: Cigarettes    Quit date: 11/04/2013    Years since quitting: 7.2  . Smokeless tobacco: Never Used  Vaping Use  . Vaping Use: Never used  Substance and Sexual Activity  . Alcohol use: No  . Drug use: No  . Sexual activity: Not Currently  Other Topics Concern  . Not on file  Social History Narrative  . Not on file   Social Determinants of Health   Financial Resource Strain: Low Risk   . Difficulty of Paying Living Expenses: Not hard at all  Food Insecurity: No Food Insecurity  . Worried About Charity fundraiser in the Last Year: Never true  . Ran Out of Food in the Last Year: Never true  Transportation Needs: No Transportation Needs  . Lack of Transportation (Medical): No  . Lack of Transportation (Non-Medical): No  Physical Activity: Inactive  . Days of Exercise per Week: 0 days  . Minutes of Exercise per Session: 0 min  Stress: No Stress Concern Present  . Feeling of Stress : Not at all  Social Connections: Socially Isolated  . Frequency of Communication with Friends and Family: More than three times a week  . Frequency of Social Gatherings with Friends and Family: Never  . Attends Religious Services: Never  . Active Member of Clubs or Organizations: No  . Attends Archivist Meetings: Never  . Marital Status: Divorced  Human resources officer Violence: Not At Risk  . Fear of Current or Ex-Partner: No  . Emotionally Abused: No  . Physically Abused: No  . Sexually Abused: No    FAMILY HISTORY:  Family History  Problem Relation Age of Onset  . Alzheimer's disease Mother   . COPD Father   . Emphysema Father   . Hypertension Father   . Healthy Sister   . Healthy Brother   . Alzheimer's disease Maternal Grandmother   . Healthy Sister   . Healthy Sister   . Healthy Sister   . Prostate cancer Other        paternal grandmother's brother; dx in early 44s  . Breast cancer Neg Hx     CURRENT MEDICATIONS:   Current Outpatient Medications  Medication Sig Dispense Refill  . acetylcysteine (MUCOMYST) 20 % nebulizer solution Inhale into the lungs.     Marland Kitchen albuterol (PROVENTIL HFA;VENTOLIN HFA) 108 (90 BASE) MCG/ACT inhaler Inhale 4-6 puffs into the lungs every 6 (six) hours as needed for wheezing or shortness of breath.    Marland Kitchen amLODipine (NORVASC) 5 MG tablet Take 5 mg by mouth at bedtime.     Marland Kitchen aspirin EC 81 MG tablet Take 81 mg by mouth daily.    Marland Kitchen CARBOPLATIN IV Inject into the vein every 21 ( twenty-one) days.    . ferrous sulfate 325 (65 FE) MG tablet Take 325 mg by mouth every other day.    . Fluticasone-Umeclidin-Vilant (TRELEGY ELLIPTA) 100-62.5-25 MCG/INH AEPB Inhale 1 puff into the lungs 2 (two) times daily.    Marland Kitchen gabapentin (NEURONTIN) 300 MG capsule Take 300 mg by mouth 3 (three) times daily.     Marland Kitchen  ipratropium (ATROVENT HFA) 17 MCG/ACT inhaler Inhale 2 puffs into the lungs every 6 (six) hours as needed for wheezing.     Marland Kitchen ipratropium-albuterol (DUONEB) 0.5-2.5 (3) MG/3ML SOLN Take 0.5 mg by nebulization every 6 (six) hours as needed.    . levalbuterol (XOPENEX) 1.25 MG/3ML nebulizer solution Inhale into the lungs.    . magnesium oxide (MAG-OX) 400 (241.3 Mg) MG tablet Take 1 tablet (400 mg total) by mouth daily. 30 tablet 0  . nortriptyline (PAMELOR) 25 MG capsule Take 25 mg by mouth at bedtime.    . ondansetron (ZOFRAN ODT) 4 MG disintegrating tablet 5m ODT q4 hours prn nausea/vomit (Patient taking differently: Take 4 mg by mouth every 4 (four) hours as needed for nausea or vomiting.) 12 tablet 0  . OXYGEN Inhale 3 L into the lungs continuous.     .Marland KitchenPACLITAXEL IV Inject into the vein every 21 ( twenty-one) days.    . pantoprazole (PROTONIX) 40 MG tablet Take 1 tablet (40 mg total) by mouth daily. 30 tablet 2  . predniSONE (DELTASONE) 10 MG tablet Take 40 mg by mouth daily.    . prochlorperazine (COMPAZINE) 10 MG tablet Take 1 tablet (10 mg total) by mouth every 6 (six) hours as needed (Nausea  or vomiting). 30 tablet 1  . simvastatin (ZOCOR) 40 MG tablet Take 40 mg by mouth at bedtime.     No current facility-administered medications for this visit.    ALLERGIES:  No Known Allergies  PHYSICAL EXAM:  Performance status (ECOG): 1 - Symptomatic but completely ambulatory  Vitals:   01/24/21 0822  BP: (!) 141/53  Pulse: 96  Resp: 18  Temp: (!) 96.9 F (36.1 C)  SpO2: 100%   Wt Readings from Last 3 Encounters:  01/24/21 117 lb 14.4 oz (53.5 kg)  01/05/21 115 lb 1.3 oz (52.2 kg)  01/03/21 120 lb 3.2 oz (54.5 kg)   Physical Exam Vitals reviewed.  Constitutional:      Appearance: Normal appearance.     Interventions: Nasal cannula in place.  Cardiovascular:     Rate and Rhythm: Normal rate and regular rhythm.     Pulses: Normal pulses.     Heart sounds: Normal heart sounds.  Pulmonary:     Effort: Pulmonary effort is normal.     Breath sounds: Examination of the right-lower field reveals rales. Examination of the left-lower field reveals rales. Rales present.  Chest:     Comments: Port-a-Cath in L chest Abdominal:     Palpations: Abdomen is soft. There is no mass.     Tenderness: There is no abdominal tenderness.     Hernia: No hernia is present.  Musculoskeletal:     Right lower leg: No edema.     Left lower leg: No edema.  Neurological:     General: No focal deficit present.     Mental Status: She is alert and oriented to person, place, and time.  Psychiatric:        Mood and Affect: Mood normal.        Behavior: Behavior normal.     LABORATORY DATA:  I have reviewed the labs as listed.  CBC Latest Ref Rng & Units 01/24/2021 01/03/2021 12/20/2020  WBC 4.0 - 10.5 K/uL 3.1(L) 3.9(L) 6.3  Hemoglobin 12.0 - 15.0 g/dL 7.4(L) 7.8(L) 8.9(L)  Hematocrit 36.0 - 46.0 % 23.4(L) 24.6(L) 27.0(L)  Platelets 150 - 400 K/uL 112(L) 148(L) 126(L)   CMP Latest Ref Rng & Units 01/24/2021 01/03/2021 12/20/2020  Glucose 70 - 99 mg/dL 100(H) 95 108(H)  BUN 8 - 23 mg/dL 8 10 6(L)   Creatinine 0.44 - 1.00 mg/dL 0.50 0.44 0.40(L)  Sodium 135 - 145 mmol/L 141 136 127(L)  Potassium 3.5 - 5.1 mmol/L 3.5 3.4(L) 3.3(L)  Chloride 98 - 111 mmol/L 103 100 87(L)  CO2 22 - 32 mmol/L '31 29 30  ' Calcium 8.9 - 10.3 mg/dL 8.8(L) 8.5(L) 8.4(L)  Total Protein 6.5 - 8.1 g/dL 6.3(L) 6.1(L) 6.3(L)  Total Bilirubin 0.3 - 1.2 mg/dL 0.3 0.3 0.5  Alkaline Phos 38 - 126 U/L 67 61 74  AST 15 - 41 U/L 19 16 13(L)  ALT 0 - 44 U/L '12 7 7    ' DIAGNOSTIC IMAGING:  I have independently reviewed the scans and discussed with the patient. No results found.   ASSESSMENT:  1. Peritoneal carcinomatosis: -Presentation to the ER with right lower quadrant abdominal pain on and off for 1 month. -CTAP on 05/29/2020 showed extensive nodularity throughout the omentum and upper peritoneal cavity. Both ovaries are prominent although no well-defined mass is noted.Small volume ascites. -CA-125 is elevated at 2407. CEA was 2.4. -CT of the chest shows 11 mm right paratracheal lymph node and 11 mm short axis precarinal lymph node. Subcarinal lymphadenopathy measuring 14 mm short axis. This is suspicious for metastatic disease. 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 -Cycle 1 of carboplatin and paclitaxel on 07/05/2020. -Evaluated by Dr. Denman George on 07/07/2020. Reevaluation after 3 cycles. -CT CAP from 09/01/2020 showed mild improvement with reduction in the omental caking. Stable to minimally reduced adenopathy in the chest and abdomen. New 4 mm right upper lobe lung nodule could be inflammatory. -CT CAP on 11/07/2019 and after 6 cycles showed improved peritoneal disease/omental caking. 9 mm short axis portacaval lymph node and adjacent upper abdominal lymph nodes measuring 12 mm in short axis. Small retroperitoneal lymph nodes up to 5 mm improved.  Peritoneal disease/omental caking now measures 2.4 cm in thickness. -CA-125 on 10/18/2020 improved 52. -Plan for surgical resection after 6 cycles.  Unfortunately she was not cleared for surgery by anesthesia/pulmonary.  2. COPD: -Quit smoking 6 years ago. Smoked 2 packs/day for 25-30 years. -Has been on3 L/min oxygen via nasal cannulafor the last 5 years.  3. Family history: -Paternal grandmother with colon cancer.   PLAN:  1.High-grade serous ovarian carcinoma: -She has tolerated last cycle of treatment reasonably well.  She did not have to go to the ER her hospitalized. -Reviewed labs today.  White count is 3.1 with ANC 1.7.  Platelet count is 112. -CA-125 has slightly increased to 53.  She will proceed with her next cycle today with dose reductions. -RTC 3 weeks.  I plan to repeat CT CAP and CA-125 prior to next visit.  2. Family history: -Germline mutation testing was negative.  3. Peripheral neuropathy: -Neuropathy stable.  Continue gabapentin 300 mg 3 times a day.  4.Severe COPD: -Continue prednisone, Trelegy and Xopenex.  5.  Hypomagnesemia: -Continue magnesium 400 mg daily.   Orders placed this encounter:  No orders of the defined types were placed in this encounter.    Derek Jack, MD [place] [phone]   I, Milinda Antis, am acting as a scribe for Dr. Sanda Linger.  I, Derek Jack MD, have reviewed the above documentation for accuracy and completeness, and I agree with the above.

## 2021-01-24 NOTE — Progress Notes (Signed)
Patient was assessed by Dr. Katragadda and labs have been reviewed.  Patient is okay to proceed with treatment today. Primary RN and pharmacy aware.   

## 2021-01-24 NOTE — Patient Instructions (Signed)
[place] at [facility] Discharge Instructions  You were seen today by [name]. He went over your recent results. You received your treatment today. You will be scheduled to have a CT scan of your chest and abdomen done before your next visit. [name] will see you back in 3 weeks for labs and follow up.   Thank you for choosing [place] at [facility] to provide your oncology and hematology care.  To afford each patient quality time with our provider, please arrive at least 15 minutes before your scheduled appointment time.   If you have a lab appointment with the [place] [facility] please come in thru the Main Entrance and check in at the main information desk  You need to re-schedule your appointment should you arrive 10 or more minutes late.  We strive to give you quality time with our providers, and arriving late affects you and other patients whose appointments are after yours.  Also, if you no show three or more times for appointments you may be dismissed from the clinic at the providers discretion.     Again, thank you for choosing [facility].  Our hope is that these requests will decrease the amount of time that you wait before being seen by our physicians.       _____________________________________________________________  Should you have questions after your visit to [facility], please contact our office at [phone] between the hours of 8:00 a.m. and 4:30 p.m.  Voicemails left after 4:00 p.m. will not be returned until the following business day.  For prescription refill requests, have your pharmacy contact our office and allow 72 hours.    Cancer Center Support Programs:   > Cancer Support Group  2nd Tuesday of the month 1pm-2pm, Journey Room

## 2021-01-24 NOTE — Patient Instructions (Signed)
[facility] Discharge Instructions for Patients Receiving Chemotherapy  Today you received the following chemotherapy agents Taxol and Carboplatin.  To help prevent nausea and vomiting after your treatment, we encourage you to take your nausea medication.   If you develop nausea and vomiting that is not controlled by your nausea medication, call the clinic.   BELOW ARE SYMPTOMS THAT SHOULD BE REPORTED IMMEDIATELY:  *FEVER GREATER THAN 100.5 F  *CHILLS WITH OR WITHOUT FEVER  NAUSEA AND VOMITING THAT IS NOT CONTROLLED WITH YOUR NAUSEA MEDICATION  *UNUSUAL SHORTNESS OF BREATH  *UNUSUAL BRUISING OR BLEEDING  TENDERNESS IN MOUTH AND THROAT WITH OR WITHOUT PRESENCE OF ULCERS  *URINARY PROBLEMS  *BOWEL PROBLEMS  UNUSUAL RASH Items with * indicate a potential emergency and should be followed up as soon as possible.  Feel free to call the clinic should you have any questions or concerns. The clinic phone number is [phone].  Please show the CHEMO ALERT CARD at check-in to the Emergency Department and triage nurse.   

## 2021-01-26 ENCOUNTER — Inpatient Hospital Stay (HOSPITAL_COMMUNITY): Payer: Medicare HMO

## 2021-01-26 ENCOUNTER — Encounter (HOSPITAL_COMMUNITY): Payer: Self-pay

## 2021-01-26 ENCOUNTER — Other Ambulatory Visit: Payer: Self-pay

## 2021-01-26 VITALS — BP 99/61 | HR 120 | Temp 98.8°F

## 2021-01-26 DIAGNOSIS — C786 Secondary malignant neoplasm of retroperitoneum and peritoneum: Secondary | ICD-10-CM

## 2021-01-26 DIAGNOSIS — Z95828 Presence of other vascular implants and grafts: Secondary | ICD-10-CM

## 2021-01-26 DIAGNOSIS — Z5111 Encounter for antineoplastic chemotherapy: Secondary | ICD-10-CM | POA: Diagnosis not present

## 2021-01-26 MED ORDER — PEGFILGRASTIM-CBQV 6 MG/0.6ML ~~LOC~~ SOSY
6.0000 mg | PREFILLED_SYRINGE | Freq: Once | SUBCUTANEOUS | Status: AC
  Administered 2021-01-26: 6 mg via SUBCUTANEOUS

## 2021-01-26 NOTE — Progress Notes (Signed)
[name] presents today for injection per the provider's orders.  Udenyca administration without incident; injection site WNL; see MAR for injection details.  Patient tolerated procedure well and without incident.  No questions or complaints noted at this time.  Discharged in c/o s/o in stable condition.

## 2021-01-26 NOTE — Progress Notes (Signed)
Nutrition Follow-up:  Patient with ovarian cancer.  Receiving chemotherapy.  Patient not surgical candidate due to COPD.  Met with patient in clinic after injection.  Patient reports that she is not feeling well.  RN aware and checking vitals.  Appetite has been good prior to not feeling well.     Medications: reviewed  Labs: reviewed  Anthropometrics:   Weight 117 lb 14.4 oz on 3/9  120 lb 3.2 oz on 2/16 117 lb on 11/6 115 lb on 12/1   NUTRITION DIAGNOSIS: Unintentional weight loss stable   INTERVENTION:  Encouraged patient to push fluids.  Encouraged small mini meals during the day Encouraged adding carnation breakfast essentials shake for added nutrition    MONITORING, EVALUATION, GOAL: weight trends, intake   NEXT VISIT: April 1 f/u after injection  Joli B. Zenia Resides, [place], [place] Registered Dietitian [phone] (mobile)

## 2021-01-30 ENCOUNTER — Inpatient Hospital Stay (HOSPITAL_COMMUNITY): Payer: Medicare HMO

## 2021-01-30 ENCOUNTER — Other Ambulatory Visit: Payer: Self-pay

## 2021-01-30 ENCOUNTER — Other Ambulatory Visit (HOSPITAL_COMMUNITY): Payer: Self-pay

## 2021-01-30 ENCOUNTER — Ambulatory Visit (HOSPITAL_COMMUNITY)
Admission: RE | Admit: 2021-01-30 | Discharge: 2021-01-30 | Disposition: A | Discharge status: Home / Self Care | Payer: Medicare HMO | Source: Ambulatory Visit | Attending: Hematology | Admitting: Hematology

## 2021-01-30 DIAGNOSIS — C786 Secondary malignant neoplasm of retroperitoneum and peritoneum: Secondary | ICD-10-CM

## 2021-01-30 LAB — URINALYSIS, ROUTINE W REFLEX MICROSCOPIC
Bilirubin Urine: NEGATIVE
Glucose, UA: NEGATIVE mg/dL
Hgb urine dipstick: NEGATIVE
Ketones, ur: NEGATIVE mg/dL
Nitrite: NEGATIVE
Protein, ur: 100 mg/dL — AB
Specific Gravity, Urine: 1.024 (ref 1.005–1.030)
pH: 5 (ref 5.0–8.0)

## 2021-01-30 NOTE — Progress Notes (Signed)
Patient called reporting ongoing fever. Has reached out to her pulmonologist and is finishing her ZPak with one dose left to take. Patient reports temperature ranging from 98-101 having last checked her temperature this morning and it was 101.6. Patient reports no other symptoms at this time. [name] aware and has recommended a COVID-19 test, CXR and UA. Orders placed for CXR and UA. Patient to come do those today. Patient to go to a local pharmacy for COVID-19 testing. Patient made aware of these recommendations and verbalized understanding.

## 2021-02-12 ENCOUNTER — Ambulatory Visit (HOSPITAL_COMMUNITY)
Admission: RE | Admit: 2021-02-12 | Discharge: 2021-02-12 | Disposition: A | Discharge status: Home / Self Care | Payer: Medicare HMO | Source: Ambulatory Visit | Attending: Hematology | Admitting: Hematology

## 2021-02-12 ENCOUNTER — Encounter (HOSPITAL_COMMUNITY): Payer: Self-pay | Admitting: Radiology

## 2021-02-12 DIAGNOSIS — C569 Malignant neoplasm of unspecified ovary: Secondary | ICD-10-CM | POA: Diagnosis not present

## 2021-02-12 DIAGNOSIS — C786 Secondary malignant neoplasm of retroperitoneum and peritoneum: Secondary | ICD-10-CM | POA: Diagnosis present

## 2021-02-12 MED ORDER — IOHEXOL 300 MG/ML  SOLN
100.0000 mL | Freq: Once | INTRAMUSCULAR | Status: AC | PRN
  Administered 2021-02-12: 100 mL via INTRAVENOUS

## 2021-02-14 ENCOUNTER — Inpatient Hospital Stay (HOSPITAL_COMMUNITY): Payer: Medicare HMO

## 2021-02-14 ENCOUNTER — Encounter (HOSPITAL_COMMUNITY): Payer: Self-pay

## 2021-02-14 ENCOUNTER — Other Ambulatory Visit: Payer: Self-pay

## 2021-02-14 ENCOUNTER — Inpatient Hospital Stay (HOSPITAL_COMMUNITY): Payer: Medicare HMO | Admitting: Hematology

## 2021-02-14 VITALS — BP 114/58 | HR 85 | Temp 97.4°F | Resp 18

## 2021-02-14 VITALS — BP 135/65 | HR 89 | Temp 97.2°F | Resp 18 | Wt 114.6 lb

## 2021-02-14 DIAGNOSIS — C569 Malignant neoplasm of unspecified ovary: Secondary | ICD-10-CM

## 2021-02-14 DIAGNOSIS — C786 Secondary malignant neoplasm of retroperitoneum and peritoneum: Secondary | ICD-10-CM

## 2021-02-14 DIAGNOSIS — Z5111 Encounter for antineoplastic chemotherapy: Secondary | ICD-10-CM | POA: Diagnosis not present

## 2021-02-14 DIAGNOSIS — D649 Anemia, unspecified: Secondary | ICD-10-CM

## 2021-02-14 LAB — COMPREHENSIVE METABOLIC PANEL
ALT: 8 U/L (ref 0–44)
AST: 16 U/L (ref 15–41)
Albumin: 3.4 g/dL — ABNORMAL LOW (ref 3.5–5.0)
Alkaline Phosphatase: 69 U/L (ref 38–126)
Anion gap: 10 (ref 5–15)
BUN: 7 mg/dL — ABNORMAL LOW (ref 8–23)
CO2: 31 mmol/L (ref 22–32)
Calcium: 8.5 mg/dL — ABNORMAL LOW (ref 8.9–10.3)
Chloride: 97 mmol/L — ABNORMAL LOW (ref 98–111)
Creatinine, Ser: 0.5 mg/dL (ref 0.44–1.00)
GFR, Estimated: >60 mL/min (ref >60)
Glucose, Bld: 91 mg/dL (ref 70–99)
Potassium: 3.4 mmol/L — ABNORMAL LOW (ref 3.5–5.1)
Sodium: 138 mmol/L (ref 135–145)
Total Bilirubin: 0.1 mg/dL — ABNORMAL LOW (ref 0.3–1.2)
Total Protein: 6.8 g/dL (ref 6.5–8.1)

## 2021-02-14 LAB — CBC WITH DIFFERENTIAL/PLATELET
Abs Immature Granulocytes: 0.02 10*3/uL (ref 0.00–0.07)
Basophils Absolute: 0 10*3/uL (ref 0.0–0.1)
Basophils Relative: 1 %
Eosinophils Absolute: 0 10*3/uL (ref 0.0–0.5)
Eosinophils Relative: 1 %
HCT: 18.9 % — ABNORMAL LOW (ref 36.0–46.0)
Hemoglobin: 5.9 g/dL — CL (ref 12.0–15.0)
Immature Granulocytes: 1 %
Lymphocytes Relative: 35 %
Lymphs Abs: 1.3 10*3/uL (ref 0.7–4.0)
MCH: 33.9 pg (ref 26.0–34.0)
MCHC: 31.2 g/dL (ref 30.0–36.0)
MCV: 108.6 fL — ABNORMAL HIGH (ref 80.0–100.0)
Monocytes Absolute: 0.4 10*3/uL (ref 0.1–1.0)
Monocytes Relative: 12 %
Neutro Abs: 1.9 10*3/uL (ref 1.7–7.7)
Neutrophils Relative %: 50 %
Platelets: 183 10*3/uL (ref 150–400)
RBC: 1.74 MIL/uL — ABNORMAL LOW (ref 3.87–5.11)
RDW: 19.7 % — ABNORMAL HIGH (ref 11.5–15.5)
WBC: 3.7 10*3/uL — ABNORMAL LOW (ref 4.0–10.5)
nRBC: 0 % (ref 0.0–0.2)

## 2021-02-14 LAB — PREPARE RBC (CROSSMATCH)

## 2021-02-14 LAB — MAGNESIUM: Magnesium: 1.5 mg/dL — ABNORMAL LOW (ref 1.7–2.4)

## 2021-02-14 MED ORDER — DIPHENHYDRAMINE HCL 25 MG PO CAPS
25.0000 mg | ORAL_CAPSULE | Freq: Once | ORAL | Status: AC
  Administered 2021-02-14: 25 mg via ORAL
  Filled 2021-02-14: qty 1

## 2021-02-14 MED ORDER — PALONOSETRON HCL INJECTION 0.25 MG/5ML
INTRAVENOUS | Status: AC
  Filled 2021-02-14: qty 5

## 2021-02-14 MED ORDER — SODIUM CHLORIDE 0.9% IV SOLUTION
250.0000 mL | Freq: Once | INTRAVENOUS | Status: AC
  Administered 2021-02-14: 250 mL via INTRAVENOUS

## 2021-02-14 MED ORDER — HEPARIN SOD (PORK) LOCK FLUSH 100 UNIT/ML IV SOLN
500.0000 [IU] | Freq: Every day | INTRAVENOUS | Status: AC | PRN
  Administered 2021-02-14: 500 [IU]

## 2021-02-14 MED ORDER — FAMOTIDINE IN NACL 20-0.9 MG/50ML-% IV SOLN
INTRAVENOUS | Status: AC
  Filled 2021-02-14: qty 50

## 2021-02-14 MED ORDER — MAGNESIUM OXIDE 400 (241.3 MG) MG PO TABS: 400.0000 mg | ORAL_TABLET | Freq: Two times a day (BID) | ORAL | 3 refills | Status: DC

## 2021-02-14 MED ORDER — DIPHENHYDRAMINE HCL 50 MG/ML IJ SOLN
INTRAMUSCULAR | Status: AC
  Filled 2021-02-14: qty 1

## 2021-02-14 MED ORDER — SODIUM CHLORIDE 0.9% FLUSH
10.0000 mL | INTRAVENOUS | Status: AC | PRN
  Administered 2021-02-14: 10 mL

## 2021-02-14 MED ORDER — ACETAMINOPHEN 325 MG PO TABS
650.0000 mg | ORAL_TABLET | Freq: Once | ORAL | Status: AC
  Administered 2021-02-14: 650 mg via ORAL
  Filled 2021-02-14: qty 2

## 2021-02-14 MED ORDER — MAGNESIUM SULFATE 2 GM/50ML IV SOLN
2.0000 g | Freq: Once | INTRAVENOUS | Status: AC
  Administered 2021-02-14: 2 g via INTRAVENOUS
  Filled 2021-02-14: qty 50

## 2021-02-14 NOTE — Progress Notes (Signed)
Patient presents today for treatment and follow up visit with [name]. Labs pending. Vital signs within parameters for treatment. MAR reviewed and updated. Patient has complaints of shortness of breath on exertion that is worse since her last treatment.   HGB 5.9 . NO treatment today per MD. Infuse 2 Units of PRBC's today. Patient to return tomorrow for treatment. Appointment made for tomorrow. 02/15/21. Message received to infuse 2 Grams Magnesium Sulfate today. Magnesium 1.5.  2 Units of PRBC's given today per MD orders. Tolerated infusion without adverse affects. Vital signs stable. No complaints at this time. Discharged from clinic via wheel chair in stable condition. Alert and oriented x 3. F/U with [facility] as scheduled.

## 2021-02-14 NOTE — Progress Notes (Signed)
CRITICAL VALUE STICKER  CRITICAL VALUE:  hgb 5.9  RECEIVER (on-site recipient of call):  A. Ouida Sills, RN  DATE & TIME NOTIFIED: 02/14/2021 at 0927  MD NOTIFIED: Delton Coombes  RESPONSE: Hold tx today; transfuse 2 units PRBC today; RTC tomorrow for tx

## 2021-02-14 NOTE — Progress Notes (Signed)
[place], [place] [address]   CLINIC:  Medical Oncology/Hematology  PCP:  [name], [place] / [place] [address] [phone]   REASON FOR VISIT:  Follow-up for high-grade serous ovarian carcinoma  PRIOR THERAPY: None  NGS Results: BRCA 1/2 negative  CURRENT THERAPY: Carboplatin, paclitaxel & Aloxi every 3 weeks  BRIEF ONCOLOGIC HISTORY:  Oncology History  Peritoneal carcinomatosis ([place])  06/06/2020 Initial Diagnosis   Peritoneal carcinomatosis ([place])   07/05/2020 -  Chemotherapy    Patient is on Treatment Plan: OVARIAN CARBOPLATIN (AUC 6) / PACLITAXEL (175) Q21D X 6 CYCLES      07/22/2020 Genetic Testing   Negative genetic testing: no pathogenic variants detected in [facility] Expanded CancerNext Panel + RNAInsight. The CancerNext-Expanded gene panel offered by [facility] and includes sequencing and rearrangement analysis for the following 77 genes: AIP, ALK, APC*, ATM*, AXIN2, BAP1, BARD1, BLM, BMPR1A, BRCA1*, BRCA2*, BRIP1*, CDC73, CDH1*, CDK4, CDKN1B, CDKN2A, CHEK2*, CTNNA1, DICER1, FANCC, FH, FLCN, GALNT12, KIF1B, LZTR1, MAX, MEN1, MET, MLH1*, MSH2*, MSH3, MSH6*, MUTYH*, NBN, NF1*, NF2, NTHL1, PALB2*, PHOX2B, PMS2*, POT1, PRKAR1A, PTCH1, PTEN*, RAD51C*, RAD51D*, RB1, RECQL, RET, SDHA, SDHAF2, SDHB, SDHC, SDHD, SMAD4, SMARCA4, SMARCB1, SMARCE1, STK11, SUFU, TMEM127, TP53*, TSC1, TSC2, VHL and XRCC2 (sequencing and deletion/duplication); EGFR, EGLN1, HOXB13, KIT, MITF, PDGFRA, POLD1, and POLE (sequencing only); EPCAM and GREM1 (deletion/duplication only). DNA and RNA analyses performed for * genes. The report date is July 22, 2020.      CANCER STAGING: Cancer Staging No matching staging information was found for the patient.  INTERVAL HISTORY:  [name], a 65 y.o. female, returns for routine follow-up and consideration for next cycle of chemotherapy. [name] was last seen on 01/24/2021.  Due for cycle  #11 of carboplatin, paclitaxel and Aloxi tomorrow.   Overall, she tells me she has been feeling okay. She complains of having worsening SOB over the past 2 weeks, especially when she gets up to do something, but denies having SOB when she rests. She is also coughing up clear sputum without blood. She called the office on 03/15 informing us of an ongoing fever ranging from 98-101 degrees F and was prescribed ZPak. She notes that she had a nosebleeds 2 weeks that flowed for several hours, but denies having hematochezia or hematuria. Her appetite is good.  She is scheduled to follow up with Dr. Raul Del on 04/04.  Overall, she feels ready for next cycle of chemo tomorrow.    REVIEW OF SYSTEMS:  Review of Systems  HENT:   Positive for nosebleeds (x1 episode 2 weeks ago).   Respiratory: Positive for cough (clear sputum) and shortness of breath (worsening w/ exertion).   Gastrointestinal: Negative for blood in stool.  Genitourinary: Negative for hematuria.     PAST MEDICAL/SURGICAL HISTORY:  Past Medical History:  Diagnosis Date  . Anemia   . Asthma   . Cancer [facility])    Gastric Cancer  . COPD (chronic obstructive pulmonary disease) ([place])   . High cholesterol   . Hypertension   . Neuropathy   . Pneumonia 2019  . Port-A-Cath in place 07/03/2020   Past Surgical History:  Procedure Laterality Date  . HALLUX VALGUS BASE WEDGE Right 06/09/2015   Procedure: Base wedge osteotomy with modified McBride right foot ;  Surgeon: Sharlotte Alamo, MD;  Location: [facility] ORS;  Service: Podiatry;  Laterality: Right;  . PORTACATH PLACEMENT Left 06/28/2020   Procedure: PORT-A-CATHETER PLACEMENT LEFT CHEST (attached  catheter in left subclavian);  Surgeon: Virl Cagey, MD;  Location: [facility] ORS;  Service: General;  Laterality: Left;    SOCIAL HISTORY:  Social History   Socioeconomic History  . Marital status: Divorced    Spouse name: Not on file  . Number of children: 3  . Years of education: Not on file  .  Highest education level: Not on file  Occupational History  . Occupation: DISABLED  Tobacco Use  . Smoking status: Former Smoker    Packs/day: 2.00    Years: 30.00    Pack years: 60.00    Types: Cigarettes    Quit date: 11/04/2013    Years since quitting: 7.2  . Smokeless tobacco: Never Used  Vaping Use  . Vaping Use: Never used  Substance and Sexual Activity  . Alcohol use: No  . Drug use: No  . Sexual activity: Not Currently  Other Topics Concern  . Not on file  Social History Narrative  . Not on file   Social Determinants of Health   Financial Resource Strain: Low Risk   . Difficulty of Paying Living Expenses: Not hard at all  Food Insecurity: No Food Insecurity  . Worried About Charity fundraiser in the Last Year: Never true  . Ran Out of Food in the Last Year: Never true  Transportation Needs: No Transportation Needs  . Lack of Transportation (Medical): No  . Lack of Transportation (Non-Medical): No  Physical Activity: Inactive  . Days of Exercise per Week: 0 days  . Minutes of Exercise per Session: 0 min  Stress: No Stress Concern Present  . Feeling of Stress : Not at all  Social Connections: Socially Isolated  . Frequency of Communication with Friends and Family: More than three times a week  . Frequency of Social Gatherings with Friends and Family: Never  . Attends Religious Services: Never  . Active Member of Clubs or Organizations: No  . Attends Archivist Meetings: Never  . Marital Status: Divorced  Human resources officer Violence: Not At Risk  . Fear of Current or Ex-Partner: No  . Emotionally Abused: No  . Physically Abused: No  . Sexually Abused: No    FAMILY HISTORY:  Family History  Problem Relation Age of Onset  . Alzheimer's disease Mother   . COPD Father   . Emphysema Father   . Hypertension Father   . Healthy Sister   . Healthy Brother   . Alzheimer's disease Maternal Grandmother   . Healthy Sister   . Healthy Sister   . Healthy  Sister   . Prostate cancer Other        paternal grandmother's brother; dx in early 34s  . Breast cancer Neg Hx     CURRENT MEDICATIONS:  Current Outpatient Medications  Medication Sig Dispense Refill  . acetylcysteine (MUCOMYST) 20 % nebulizer solution Inhale into the lungs.     Marland Kitchen albuterol (PROVENTIL HFA;VENTOLIN HFA) 108 (90 BASE) MCG/ACT inhaler Inhale 4-6 puffs into the lungs every 6 (six) hours as needed for wheezing or shortness of breath.    Marland Kitchen amLODipine (NORVASC) 5 MG tablet Take 5 mg by mouth at bedtime.     Marland Kitchen aspirin EC 81 MG tablet Take 81 mg by mouth daily.    Marland Kitchen CARBOPLATIN IV Inject into the vein every 21 ( twenty-one) days.    . ferrous sulfate 325 (65 FE) MG tablet Take 325 mg by mouth every other day.    . Fluticasone-Umeclidin-Vilant (TRELEGY ELLIPTA)  100-62.5-25 MCG/INH AEPB Inhale 1 puff into the lungs 2 (two) times daily.    Marland Kitchen gabapentin (NEURONTIN) 300 MG capsule Take 300 mg by mouth 3 (three) times daily.     Marland Kitchen ipratropium (ATROVENT HFA) 17 MCG/ACT inhaler Inhale 2 puffs into the lungs every 6 (six) hours as needed for wheezing.     Marland Kitchen ipratropium-albuterol (DUONEB) 0.5-2.5 (3) MG/3ML SOLN Take 0.5 mg by nebulization every 6 (six) hours as needed.    . levalbuterol (XOPENEX) 1.25 MG/3ML nebulizer solution Inhale into the lungs.    . magnesium oxide (MAG-OX) 400 (241.3 Mg) MG tablet Take 1 tablet (400 mg total) by mouth 2 (two) times daily. 60 tablet 3  . nortriptyline (PAMELOR) 25 MG capsule Take 25 mg by mouth at bedtime.    . ondansetron (ZOFRAN ODT) 4 MG disintegrating tablet 66m ODT q4 hours prn nausea/vomit (Patient taking differently: Take 4 mg by mouth every 4 (four) hours as needed for nausea or vomiting.) 12 tablet 0  . OXYGEN Inhale 3 L into the lungs continuous.     .Marland KitchenPACLITAXEL IV Inject into the vein every 21 ( twenty-one) days.    . pantoprazole (PROTONIX) 40 MG tablet Take 1 tablet (40 mg total) by mouth daily. 30 tablet 2  . predniSONE (DELTASONE) 10 MG  tablet Take 40 mg by mouth daily.    . prochlorperazine (COMPAZINE) 10 MG tablet Take 1 tablet (10 mg total) by mouth every 6 (six) hours as needed (Nausea or vomiting). 30 tablet 1  . simvastatin (ZOCOR) 40 MG tablet Take 40 mg by mouth at bedtime.     No current facility-administered medications for this visit.   Facility-Administered Medications Ordered in Other Visits  Medication Dose Route Frequency Provider Last Rate Last Admin  . heparin lock flush 100 unit/mL  500 Units Intracatheter Daily PRN KDerek Jack MD      . magnesium sulfate IVPB 2 g 50 mL  2 g Intravenous Once KDerek Jack MD      . sodium chloride flush (NS) 0.9 % injection 10 mL  10 mL Intracatheter PRN KDerek Jack MD        ALLERGIES:  No Known Allergies  PHYSICAL EXAM:  Performance status (ECOG): 1 - Symptomatic but completely ambulatory  Vitals:   02/14/21 0827  BP: 135/65  Pulse: 89  Resp: 18  Temp: (!) 97.2 F (36.2 C)  SpO2: 100%   Wt Readings from Last 3 Encounters:  02/14/21 114 lb 9.6 oz (52 kg)  01/24/21 117 lb 14.4 oz (53.5 kg)  01/05/21 115 lb 1.3 oz (52.2 kg)   Physical Exam Vitals reviewed.  Constitutional:      Appearance: Normal appearance.     Interventions: Nasal cannula in place.  Cardiovascular:     Rate and Rhythm: Normal rate and regular rhythm.     Pulses: Normal pulses.     Heart sounds: Normal heart sounds.  Pulmonary:     Effort: Pulmonary effort is normal.     Breath sounds: Normal breath sounds.  Chest:     Comments: Port-a-Cath in L chest Neurological:     General: No focal deficit present.     Mental Status: She is alert and oriented to person, place, and time.  Psychiatric:        Mood and Affect: Mood normal.        Behavior: Behavior normal.     LABORATORY DATA:  I have reviewed the labs as listed.  CBC Latest Ref  Rng & Units 02/14/2021 01/24/2021 01/03/2021  WBC 4.0 - 10.5 K/uL 3.7(L) 3.1(L) 3.9(L)  Hemoglobin 12.0 - 15.0 g/dL  5.9(LL) 7.4(L) 7.8(L)  Hematocrit 36.0 - 46.0 % 18.9(L) 23.4(L) 24.6(L)  Platelets 150 - 400 K/uL 183 112(L) 148(L)   CMP Latest Ref Rng & Units 02/14/2021 01/24/2021 01/03/2021  Glucose 70 - 99 mg/dL 91 100(H) 95  BUN 8 - 23 mg/dL 7(L) 8 10  Creatinine 0.44 - 1.00 mg/dL 0.50 0.50 0.44  Sodium 135 - 145 mmol/L 138 141 136  Potassium 3.5 - 5.1 mmol/L 3.4(L) 3.5 3.4(L)  Chloride 98 - 111 mmol/L 97(L) 103 100  CO2 22 - 32 mmol/L '31 31 29  ' Calcium 8.9 - 10.3 mg/dL 8.5(L) 8.8(L) 8.5(L)  Total Protein 6.5 - 8.1 g/dL 6.8 6.3(L) 6.1(L)  Total Bilirubin 0.3 - 1.2 mg/dL 0.1(L) 0.3 0.3  Alkaline Phos 38 - 126 U/L 69 67 61  AST 15 - 41 U/L '16 19 16  ' ALT 0 - 44 U/L '8 12 7    ' DIAGNOSTIC IMAGING:  I have independently reviewed the scans and discussed with the patient. DG Chest 2 View  Result Date: 01/31/2021 CLINICAL DATA:  Fever and shortness of breath. EXAM: CHEST - 2 VIEW COMPARISON:  Chest x-ray 12/17/2020 and chest CT 12/18/2020 FINDINGS: The left subclavian power port is stable. The cardiac silhouette, mediastinal and hilar contours are within normal limits and stable. Stable mild tortuosity and calcification of the thoracic aorta. Stable significant underlying emphysematous changes and pulmonary scarring. Progressive left upper lobe lung opacity could suggest persistent or recurrent pneumonia or post pneumonic scarring changes. A small left pleural effusion is noted. Minimal streaky overlying left basilar atelectasis. No worrisome pulmonary lesions. The bony thorax is intact. IMPRESSION: 1. Progressive left upper lobe lung opacity could suggest persistent or recurrent pneumonia or post pneumonic scarring changes. Chest CT may be helpful for further evaluation. 2. Significant underlying chronic lung disease. 3. Small left pleural effusion. Electronically Signed   By: Marijo Sanes M.D.   On: 01/31/2021 14:14   CT CHEST ABDOMEN PELVIS W CONTRAST  Result Date: 02/13/2021 CLINICAL DATA:  Ovarian cancer,  assess treatment response in the setting of peritoneal disease in this 65 year old. EXAM: CT CHEST, ABDOMEN, AND PELVIS WITH CONTRAST TECHNIQUE: Multidetector CT imaging of the chest, abdomen and pelvis was performed following the standard protocol during bolus administration of intravenous contrast. CONTRAST:  197m OMNIPAQUE IOHEXOL 300 MG/ML  SOLN COMPARISON:  November 14, 2020.  In December 18, 2020, chest CT. FINDINGS: CT CHEST FINDINGS Cardiovascular: LEFT-sided Port-A-Cath terminates at the caval to atrial junction. Heart size normal without pericardial effusion. Central pulmonary vasculature unremarkable on venous phase assessment. Aorta with calcified and noncalcified plaque. Mediastinum/Nodes: Small thoracic inlet lymph nodes with mild enlargement, LEFT supraclavicular node on image 7 of series 2 approximately 1 cm, perhaps slightly enlarged compared to the previous exam where it measured approximately 7 8 mm short axis. Other small nodes in this location less than a cm are unchanged. No axillary lymphadenopathy. No mediastinal lymphadenopathy. No hilar lymphadenopathy. Small pre-vascular lymph nodes are similarly stable. RIGHT paratracheal lymph node measuring 9 mm short axis previously approximately 11 mm. Esophagus mildly patulous. Lungs/Pleura: Marked pulmonary emphysema worse at the lung apices. Bronchial wall thickening the LEFT lung base, mild. Nodular masslike areas in the LEFT chest in the LEFT upper lobe, worsening since prior imaging 2.6 x 2.0 cm area on image 23 of series 3 in the LEFT upper lobe contiguous with pre-vascular opacity  that was present on the prior study and much worse. Area of greatest axial dimension on image 30 of series 3 is contiguous with the upper lobe masslike area described above and measures 5.3 x 1.8 cm. Anterior LEFT upper lobe with signs of nodularity as well (image 30, series 3) 1 cm nodule in this location is unchanged. Adjacent peripheral nodular areas are similar,  some slightly decreased in others increased, for instance in area in the LEFT upper lobe measuring 1.7 cm long axis on image 19 of series 3 previously less than a cm. Tethering of the major fissure is a new phenomenon based on comparison with prior imaging retracted anteriorly. Nodule adjacent to the aortic arch in the medial LEFT upper lobe on image 38 of series 3 measures 1.2 cm previously approximately 7 mm when measured in a similar fashion Nodules have developed in the RIGHT lower lobe over time. There is however resolution of RIGHT-sided effusion near complete resolution of LEFT-sided pleural fluid since the prior study. Example of small RIGHT lower lobe nodule on image 104 of series 3 measuring 4 mm. Normal development also of RIGHT middle lobe nodule since the previous exam scattered throughout the RIGHT middle lobe some with tree-in-bud configuration Musculoskeletal: Spinal degenerative changes. No acute or destructive bone process. CT ABDOMEN PELVIS FINDINGS Hepatobiliary: No focal, suspicious hepatic lesion. No pericholecystic stranding. No biliary duct dilation. Pancreas: Normal, without mass, inflammation or ductal dilatation. Spleen: Spleen normal size and contour. Adrenals/Urinary Tract: Adrenal glands are normal. Symmetric renal enhancement. No hydronephrosis. Collapse of the urinary bladder limits assessment. No suspicious renal lesion. Stomach/Bowel: Stomach moderately distended. No perigastric stranding. No small bowel dilation. Appendix is normal. Stool throughout much of the colon. No signs of acute colonic process. Descending and sigmoid diverticulosis and diverticular disease. Vascular/Lymphatic: Calcified and noncalcified atheromatous plaque of the abdominal aorta. There is no gastrohepatic or hepatoduodenal ligament lymphadenopathy. No retroperitoneal or mesenteric lymphadenopathy. Small lymph nodes about the celiac are slightly decreased since December of 2021 largest approximately 6 mm  short axis previously approximately 9 mm on image 56 of series 2. No pelvic sidewall lymphadenopathy. 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  No adnexal masses. Other: Signs of peritoneal disease slightly diminished in terms of conspicuity and density but measuring similar greatest thickness at 2.1 cm in the mid abdomen, previously 2.3 cm disc involved the greater omentum. No ascites. Musculoskeletal: Spinal degenerative changes.  Osteopenia. IMPRESSION: 1. Continued slight decrease in peritoneal involvement and abdominal lymph nodes. 2. Nodular masslike areas in the LEFT upper lobe, with marked worsening since imaging from January of 2022 and not present on imaging from December of 2021 findings may represent worsening of infection or interval development of metastatic disease in the chest. Correlate with respiratory symptoms, consider PET scan for further evaluation. Ultimately, pulmonary consultation may be helpful. 3. Borderline enlarged LEFT supraclavicular lymph node, suspicious in light of other findings, could also be assessed with PET imaging. 4. Scattered small nodules in the RIGHT chest as well as described. 5. Aortic atherosclerosis. These results will be called to the ordering clinician or representative by the Radiologist Assistant, and communication documented in the PACS or [employer]. Aortic Atherosclerosis (ICD10-I70.0) and Emphysema (ICD10-J43.9). Electronically Signed   By: Zetta Bills M.D.   On: 02/13/2021 08:18     ASSESSMENT:  1. Peritoneal carcinomatosis: -Presentation to the ER with right lower quadrant abdominal pain on and off for 1 month. -CTAP on 05/29/2020 showed extensive nodularity throughout the omentum and upper peritoneal  cavity. Both ovaries are prominent although no well-defined mass is noted.Small volume ascites. -CA-125 is elevated at 2407. CEA was 2.4. -CT of the chest shows 11 mm right paratracheal lymph node and 11 mm short axis precarinal lymph node.  Subcarinal lymphadenopathy measuring 14 mm short axis. This is suspicious for metastatic disease. 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 -Cycle 1 of carboplatin and paclitaxel on 07/05/2020. -Evaluated by Dr. Denman George on 07/07/2020. Reevaluation after 3 cycles. -CT CAP from 09/01/2020 showed mild improvement with reduction in the omental caking. Stable to minimally reduced adenopathy in the chest and abdomen. New 4 mm right upper lobe lung nodule could be inflammatory. -CT CAP on 11/07/2019 and after 6 cycles showed improved peritoneal disease/omental caking. 9 mm short axis portacaval lymph node and adjacent upper abdominal lymph nodes measuring 12 mm in short axis. Small retroperitoneal lymph nodes up to 5 mm improved. Peritoneal disease/omental caking now measures 2.4 cm in thickness. -CA-125 on 10/18/2020 improved 52. -Plan for surgical resection after 6 cycles. Unfortunately she was not cleared for surgery by anesthesia/pulmonary.  2. COPD: -Quit smoking 6 years ago. Smoked 2 packs/day for 25-30 years. -Has been on3 L/min oxygen via nasal cannulafor the last 5 years.  3. Family history: -Paternal grandmother with colon cancer.   PLAN:  1.High-grade serous ovarian carcinoma: -She has tolerated last cycle of chemotherapy very well. -We reviewed the CT CAP from 02/12/2021. -Peritoneal disease and abdominal lymph nodes have improved.  However new masslike areas in the left upper lobe, new since January 31st CT scan along with borderline enlarged left supraclavicular lymph node. -Reviewed her labs today which showed normal LFTs.  White count was 3.7 with normal platelet count.  She will receive blood transfusion for her anemia.  The lung lesions on the CT scan does not look like malignancy given the response in the abdomen.  I  have recommended evaluation by pulmonary Dr. Raul Del. -We will follow up on CA-125.  She will proceed with next treatment tomorrow.  2. Family history: -Germline mutation testing was negative.  3. Peripheral neuropathy: -Neuropathy has been stable.  Gabapentin 300 mg 3 times a day will be continued.  4.Severe COPD: -She reported worsening shortness of breath on exertion over the last 2 weeks.  Hemoglobin was low at 5.9.  She will receive 2 units which will likely help with her breathing. -Continue prednisone, Trelegy and Xopenex. -I have talked to Dr. Raul Del about the CT scan findings which showed nodular masslike areas in the left upper lobe not present on imaging from December 2021.  They were not clearly seen from January 2022.  Borderline enlarged left supraclavicular lymph node. -Dr. Raul Del will see her on 02/19/2021 and will schedule her if she needs bronchoscopy and biopsy.  5. Hypomagnesemia: -She will receive IV magnesium 2 g.  Continue magnesium 400 mg daily.   Orders placed this encounter:  No orders of the defined types were placed in this encounter.    Derek Jack, MD [place] [phone]   I, Milinda Antis, am acting as a scribe for Dr. Sanda Linger.  I, Derek Jack MD, have reviewed the above documentation for accuracy and completeness, and I agree with the above.

## 2021-02-14 NOTE — Patient Instructions (Signed)
[place] at [facility]  Discharge Instructions: Blood products.  _______________________________________________________________  Thank you for choosing [place] at [facility] to provide your oncology and hematology care.  To afford each patient quality time with our providers, please arrive at least 15 minutes before your scheduled appointment.  You need to re-schedule your appointment if you arrive 10 or more minutes late.  We strive to give you quality time with our providers, and arriving late affects you and other patients whose appointments are after yours.  Also, if you no show three or more times for appointments you may be dismissed from the clinic.  Again, thank you for choosing [place] at [facility] hope is that these requests will allow you access to exceptional care and in a timely manner. _______________________________________________________________  If you have questions after your visit, please contact our office at [phone] between the hours of 8:30 a.m. and 5:00 p.m. Voicemails left after 4:30 p.m. will not be returned until the following business day. _______________________________________________________________  For prescription refill requests, have your pharmacy contact our office. _______________________________________________________________  Recommendations made by the consultant and any test results will be sent to your referring physician. _______________________________________________________________

## 2021-02-14 NOTE — Patient Instructions (Signed)
[place] at [facility] Discharge Instructions  You were seen today by [name]. He went over your recent results and scans. You received a blood infusion today and your treatment will be tomorrow. Keep your appointment on April 4th. [name] will see you back in 3 weeks for labs and follow up.   Thank you for choosing [place] at [facility] to provide your oncology and hematology care.  To afford each patient quality time with our provider, please arrive at least 15 minutes before your scheduled appointment time.   If you have a lab appointment with the [place] please come in thru the Main Entrance and check in at the main information desk  You need to re-schedule your appointment should you arrive 10 or more minutes late.  We strive to give you quality time with our providers, and arriving late affects you and other patients whose appointments are after yours.  Also, if you no show three or more times for appointments you may be dismissed from the clinic at the providers discretion.     Again, thank you for choosing [facility].  Our hope is that these requests will decrease the amount of time that you wait before being seen by our physicians.       _____________________________________________________________  Should you have questions after your visit to [facility], please contact our office at [phone] between the hours of 8:00 a.m. and 4:30 p.m.  Voicemails left after 4:00 p.m. will not be returned until the following business day.  For prescription refill requests, have your pharmacy contact our office and allow 72 hours.    Cancer Center Support Programs:   > Cancer Support Group  2nd Tuesday of the month 1pm-2pm, Journey Room

## 2021-02-15 ENCOUNTER — Inpatient Hospital Stay (HOSPITAL_COMMUNITY): Payer: Medicare HMO

## 2021-02-15 ENCOUNTER — Encounter (HOSPITAL_COMMUNITY): Payer: Self-pay

## 2021-02-15 VITALS — BP 122/70 | HR 81 | Temp 97.0°F | Resp 20

## 2021-02-15 DIAGNOSIS — C786 Secondary malignant neoplasm of retroperitoneum and peritoneum: Secondary | ICD-10-CM

## 2021-02-15 DIAGNOSIS — Z5111 Encounter for antineoplastic chemotherapy: Secondary | ICD-10-CM | POA: Diagnosis not present

## 2021-02-15 DIAGNOSIS — Z95828 Presence of other vascular implants and grafts: Secondary | ICD-10-CM

## 2021-02-15 LAB — BPAM RBC
Blood Product Expiration Date: 202204232359
Blood Product Expiration Date: 202204242359
ISSUE DATE / TIME: 202203301141
ISSUE DATE / TIME: 202203301317
Unit Type and Rh: 9500
Unit Type and Rh: 9500

## 2021-02-15 LAB — TYPE AND SCREEN
ABO/RH(D): O NEG
Antibody Screen: NEGATIVE
Unit division: 0
Unit division: 0

## 2021-02-15 LAB — CA 125: Cancer Antigen (CA) 125: 67.2 U/mL — ABNORMAL HIGH (ref 0.0–38.1)

## 2021-02-15 MED ORDER — FAMOTIDINE IN NACL 20-0.9 MG/50ML-% IV SOLN
20.0000 mg | Freq: Once | INTRAVENOUS | Status: AC
  Administered 2021-02-15: 20 mg via INTRAVENOUS

## 2021-02-15 MED ORDER — SODIUM CHLORIDE 0.9 % IV SOLN
449.0000 mg | Freq: Once | INTRAVENOUS | Status: AC
  Administered 2021-02-15: 450 mg via INTRAVENOUS
  Filled 2021-02-15: qty 45

## 2021-02-15 MED ORDER — SODIUM CHLORIDE 0.9% FLUSH
10.0000 mL | INTRAVENOUS | Status: DC | PRN
  Administered 2021-02-15: 10 mL

## 2021-02-15 MED ORDER — DIPHENHYDRAMINE HCL 50 MG/ML IJ SOLN
50.0000 mg | Freq: Once | INTRAMUSCULAR | Status: AC
  Administered 2021-02-15: 50 mg via INTRAVENOUS

## 2021-02-15 MED ORDER — DIPHENHYDRAMINE HCL 50 MG/ML IJ SOLN
INTRAMUSCULAR | Status: AC
  Filled 2021-02-15: qty 1

## 2021-02-15 MED ORDER — FAMOTIDINE IN NACL 20-0.9 MG/50ML-% IV SOLN
INTRAVENOUS | Status: AC
  Filled 2021-02-15: qty 50

## 2021-02-15 MED ORDER — SODIUM CHLORIDE 0.9 % IV SOLN
140.0000 mg/m2 | Freq: Once | INTRAVENOUS | Status: AC
  Administered 2021-02-15: 216 mg via INTRAVENOUS
  Filled 2021-02-15: qty 36

## 2021-02-15 MED ORDER — SODIUM CHLORIDE 0.9 % IV SOLN: Freq: Once | INTRAVENOUS | Status: AC

## 2021-02-15 MED ORDER — PALONOSETRON HCL INJECTION 0.25 MG/5ML
0.2500 mg | Freq: Once | INTRAVENOUS | Status: AC
  Administered 2021-02-15: 0.25 mg via INTRAVENOUS

## 2021-02-15 MED ORDER — SODIUM CHLORIDE 0.9 % IV SOLN
10.0000 mg | Freq: Once | INTRAVENOUS | Status: AC
  Administered 2021-02-15: 10 mg via INTRAVENOUS
  Filled 2021-02-15: qty 10

## 2021-02-15 MED ORDER — SODIUM CHLORIDE 0.9 % IV SOLN
150.0000 mg | Freq: Once | INTRAVENOUS | Status: AC
  Administered 2021-02-15: 150 mg via INTRAVENOUS
  Filled 2021-02-15: qty 75

## 2021-02-15 MED ORDER — PALONOSETRON HCL INJECTION 0.25 MG/5ML
INTRAVENOUS | Status: AC
  Filled 2021-02-15: qty 5

## 2021-02-15 MED ORDER — HEPARIN SOD (PORK) LOCK FLUSH 100 UNIT/ML IV SOLN
500.0000 [IU] | Freq: Once | INTRAVENOUS | Status: AC | PRN
  Administered 2021-02-15: 500 [IU]

## 2021-02-15 NOTE — Progress Notes (Signed)
Patient presents today for Taxol and Carboplatin. Vital signs within parameters for treatment. Patient has no complaints today.   Treatment given today per MD orders. Tolerated infusion without adverse affects. Vital signs stable. No complaints at this time. Discharged from clinic via wheel chair accompanied by her son in stable condition. Alert and oriented x 3. F/U with [facility] as scheduled.

## 2021-02-15 NOTE — Patient Instructions (Signed)
[facility] Discharge Instructions for Patients Receiving Chemotherapy  Today you received the following chemotherapy agents Taxol, Carboplatin.   To help prevent nausea and vomiting after your treatment, we encourage you to take your nausea medication    If you develop nausea and vomiting that is not controlled by your nausea medication, call the clinic.   BELOW ARE SYMPTOMS THAT SHOULD BE REPORTED IMMEDIATELY:  *FEVER GREATER THAN 100.5 F  *CHILLS WITH OR WITHOUT FEVER  NAUSEA AND VOMITING THAT IS NOT CONTROLLED WITH YOUR NAUSEA MEDICATION  *UNUSUAL SHORTNESS OF BREATH  *UNUSUAL BRUISING OR BLEEDING  TENDERNESS IN MOUTH AND THROAT WITH OR WITHOUT PRESENCE OF ULCERS  *URINARY PROBLEMS  *BOWEL PROBLEMS  UNUSUAL RASH Items with * indicate a potential emergency and should be followed up as soon as possible.  Feel free to call the clinic should you have any questions or concerns. The clinic phone number is [phone].  Please show the CHEMO ALERT CARD at check-in to the Emergency Department and triage nurse.   

## 2021-02-15 NOTE — Addendum Note (Signed)
Addended by: Derek Jack on: 02/15/2021 08:19 AM   Modules accepted: Orders

## 2021-02-16 ENCOUNTER — Other Ambulatory Visit: Payer: Self-pay

## 2021-02-16 ENCOUNTER — Inpatient Hospital Stay (HOSPITAL_COMMUNITY): Payer: Medicare HMO

## 2021-02-16 ENCOUNTER — Emergency Department (HOSPITAL_COMMUNITY): Payer: Medicare HMO

## 2021-02-16 ENCOUNTER — Encounter (HOSPITAL_COMMUNITY): Payer: Self-pay | Admitting: *Deleted

## 2021-02-16 ENCOUNTER — Observation Stay (HOSPITAL_COMMUNITY)
Admission: EM | Admit: 2021-02-16 | Discharge: 2021-02-18 | Disposition: A | Discharge status: Home / Self Care | Payer: Medicare HMO | Attending: Family Medicine | Admitting: Family Medicine

## 2021-02-16 ENCOUNTER — Inpatient Hospital Stay (HOSPITAL_COMMUNITY): Payer: Medicare HMO | Attending: Hematology

## 2021-02-16 VITALS — BP 130/70 | HR 118 | Temp 97.0°F | Resp 20

## 2021-02-16 DIAGNOSIS — J45909 Unspecified asthma, uncomplicated: Secondary | ICD-10-CM | POA: Insufficient documentation

## 2021-02-16 DIAGNOSIS — J449 Chronic obstructive pulmonary disease, unspecified: Secondary | ICD-10-CM | POA: Diagnosis not present

## 2021-02-16 DIAGNOSIS — Z5189 Encounter for other specified aftercare: Secondary | ICD-10-CM | POA: Insufficient documentation

## 2021-02-16 DIAGNOSIS — J9610 Chronic respiratory failure, unspecified whether with hypoxia or hypercapnia: Secondary | ICD-10-CM | POA: Diagnosis present

## 2021-02-16 DIAGNOSIS — R042 Hemoptysis: Secondary | ICD-10-CM

## 2021-02-16 DIAGNOSIS — R0602 Shortness of breath: Secondary | ICD-10-CM

## 2021-02-16 DIAGNOSIS — J9611 Chronic respiratory failure with hypoxia: Secondary | ICD-10-CM | POA: Diagnosis not present

## 2021-02-16 DIAGNOSIS — K3189 Other diseases of stomach and duodenum: Secondary | ICD-10-CM | POA: Insufficient documentation

## 2021-02-16 DIAGNOSIS — C786 Secondary malignant neoplasm of retroperitoneum and peritoneum: Secondary | ICD-10-CM | POA: Diagnosis present

## 2021-02-16 DIAGNOSIS — J9621 Acute and chronic respiratory failure with hypoxia: Secondary | ICD-10-CM | POA: Insufficient documentation

## 2021-02-16 DIAGNOSIS — Z79899 Other long term (current) drug therapy: Secondary | ICD-10-CM | POA: Insufficient documentation

## 2021-02-16 DIAGNOSIS — K922 Gastrointestinal hemorrhage, unspecified: Secondary | ICD-10-CM | POA: Diagnosis present

## 2021-02-16 DIAGNOSIS — I10 Essential (primary) hypertension: Secondary | ICD-10-CM | POA: Insufficient documentation

## 2021-02-16 DIAGNOSIS — Z87891 Personal history of nicotine dependence: Secondary | ICD-10-CM | POA: Insufficient documentation

## 2021-02-16 DIAGNOSIS — E785 Hyperlipidemia, unspecified: Secondary | ICD-10-CM | POA: Diagnosis present

## 2021-02-16 DIAGNOSIS — Z9221 Personal history of antineoplastic chemotherapy: Secondary | ICD-10-CM | POA: Diagnosis not present

## 2021-02-16 DIAGNOSIS — C569 Malignant neoplasm of unspecified ovary: Secondary | ICD-10-CM | POA: Insufficient documentation

## 2021-02-16 DIAGNOSIS — D509 Iron deficiency anemia, unspecified: Secondary | ICD-10-CM | POA: Diagnosis present

## 2021-02-16 DIAGNOSIS — Z95828 Presence of other vascular implants and grafts: Secondary | ICD-10-CM

## 2021-02-16 DIAGNOSIS — K208 Other esophagitis without bleeding: Secondary | ICD-10-CM | POA: Diagnosis not present

## 2021-02-16 DIAGNOSIS — K2289 Other specified disease of esophagus: Secondary | ICD-10-CM

## 2021-02-16 DIAGNOSIS — Z8543 Personal history of malignant neoplasm of ovary: Secondary | ICD-10-CM | POA: Diagnosis not present

## 2021-02-16 DIAGNOSIS — E782 Mixed hyperlipidemia: Secondary | ICD-10-CM | POA: Diagnosis present

## 2021-02-16 DIAGNOSIS — Z85028 Personal history of other malignant neoplasm of stomach: Secondary | ICD-10-CM | POA: Insufficient documentation

## 2021-02-16 LAB — CBC WITH DIFFERENTIAL/PLATELET
Abs Immature Granulocytes: 0.03 10*3/uL (ref 0.00–0.07)
Basophils Absolute: 0 10*3/uL (ref 0.0–0.1)
Basophils Relative: 0 %
Eosinophils Absolute: 0 10*3/uL (ref 0.0–0.5)
Eosinophils Relative: 0 %
HCT: 30.2 % — ABNORMAL LOW (ref 36.0–46.0)
Hemoglobin: 9.7 g/dL — ABNORMAL LOW (ref 12.0–15.0)
Immature Granulocytes: 0 %
Lymphocytes Relative: 15 %
Lymphs Abs: 1.1 10*3/uL (ref 0.7–4.0)
MCH: 31.9 pg (ref 26.0–34.0)
MCHC: 32.1 g/dL (ref 30.0–36.0)
MCV: 99.3 fL (ref 80.0–100.0)
Monocytes Absolute: 0.4 10*3/uL (ref 0.1–1.0)
Monocytes Relative: 5 %
Neutro Abs: 5.8 10*3/uL (ref 1.7–7.7)
Neutrophils Relative %: 80 %
Platelets: 272 10*3/uL (ref 150–400)
RBC: 3.04 MIL/uL — ABNORMAL LOW (ref 3.87–5.11)
RDW: 19.4 % — ABNORMAL HIGH (ref 11.5–15.5)
WBC: 7.3 10*3/uL (ref 4.0–10.5)
nRBC: 0 % (ref 0.0–0.2)

## 2021-02-16 LAB — CBC
HCT: 28.1 % — ABNORMAL LOW (ref 36.0–46.0)
Hemoglobin: 8.7 g/dL — ABNORMAL LOW (ref 12.0–15.0)
MCH: 31.4 pg (ref 26.0–34.0)
MCHC: 31 g/dL (ref 30.0–36.0)
MCV: 101.4 fL — ABNORMAL HIGH (ref 80.0–100.0)
Platelets: 245 10*3/uL (ref 150–400)
RBC: 2.77 MIL/uL — ABNORMAL LOW (ref 3.87–5.11)
RDW: 19.5 % — ABNORMAL HIGH (ref 11.5–15.5)
WBC: 14.4 10*3/uL — ABNORMAL HIGH (ref 4.0–10.5)
nRBC: 0 % (ref 0.0–0.2)

## 2021-02-16 LAB — TROPONIN I (HIGH SENSITIVITY): Troponin I (High Sensitivity): 5 ng/L (ref <18)

## 2021-02-16 LAB — COMPREHENSIVE METABOLIC PANEL
ALT: 9 U/L (ref 0–44)
AST: 15 U/L (ref 15–41)
Albumin: 3.2 g/dL — ABNORMAL LOW (ref 3.5–5.0)
Alkaline Phosphatase: 62 U/L (ref 38–126)
Anion gap: 11 (ref 5–15)
BUN: 29 mg/dL — ABNORMAL HIGH (ref 8–23)
CO2: 31 mmol/L (ref 22–32)
Calcium: 9.1 mg/dL (ref 8.9–10.3)
Chloride: 93 mmol/L — ABNORMAL LOW (ref 98–111)
Creatinine, Ser: 0.47 mg/dL (ref 0.44–1.00)
GFR, Estimated: >60 mL/min (ref >60)
Glucose, Bld: 104 mg/dL — ABNORMAL HIGH (ref 70–99)
Potassium: 3.5 mmol/L (ref 3.5–5.1)
Sodium: 135 mmol/L (ref 135–145)
Total Bilirubin: 0.5 mg/dL (ref 0.3–1.2)
Total Protein: 6.6 g/dL (ref 6.5–8.1)

## 2021-02-16 LAB — MAGNESIUM: Magnesium: 1.5 mg/dL — ABNORMAL LOW (ref 1.7–2.4)

## 2021-02-16 LAB — TYPE AND SCREEN
ABO/RH(D): O NEG
Antibody Screen: NEGATIVE

## 2021-02-16 MED ORDER — SODIUM CHLORIDE 0.9 % IV SOLN: INTRAVENOUS | Status: DC

## 2021-02-16 MED ORDER — PEGFILGRASTIM-CBQV 6 MG/0.6ML ~~LOC~~ SOSY
6.0000 mg | PREFILLED_SYRINGE | Freq: Once | SUBCUTANEOUS | Status: AC
  Administered 2021-02-16: 6 mg via SUBCUTANEOUS

## 2021-02-16 MED ORDER — IOHEXOL 350 MG/ML SOLN
75.0000 mL | Freq: Once | INTRAVENOUS | Status: AC | PRN
  Administered 2021-02-16: 75 mL via INTRAVENOUS

## 2021-02-16 MED ORDER — PANTOPRAZOLE SODIUM 40 MG IV SOLR: 40.0000 mg | Freq: Two times a day (BID) | INTRAVENOUS | Status: DC

## 2021-02-16 MED ORDER — MAGNESIUM SULFATE 2 GM/50ML IV SOLN
2.0000 g | Freq: Once | INTRAVENOUS | Status: AC
  Administered 2021-02-16: 2 g via INTRAVENOUS
  Filled 2021-02-16: qty 50

## 2021-02-16 MED ORDER — SODIUM CHLORIDE 0.9 % IV SOLN
8.0000 mg/h | INTRAVENOUS | Status: DC
  Administered 2021-02-16 – 2021-02-18 (×4): 8 mg/h via INTRAVENOUS
  Filled 2021-02-16 (×8): qty 80

## 2021-02-16 MED ORDER — HYDROCORTISONE NA SUCCINATE PF 100 MG IJ SOLR
50.0000 mg | Freq: Three times a day (TID) | INTRAMUSCULAR | Status: AC
  Administered 2021-02-16 – 2021-02-17 (×2): 50 mg via INTRAVENOUS
  Filled 2021-02-16 (×2): qty 2

## 2021-02-16 MED ORDER — SODIUM CHLORIDE 0.9 % IV SOLN
80.0000 mg | Freq: Once | INTRAVENOUS | Status: AC
  Administered 2021-02-16: 80 mg via INTRAVENOUS
  Filled 2021-02-16: qty 80

## 2021-02-16 MED ORDER — METOCLOPRAMIDE HCL 5 MG/ML IJ SOLN
10.0000 mg | Freq: Once | INTRAMUSCULAR | Status: AC
  Administered 2021-02-16: 10 mg via INTRAVENOUS
  Filled 2021-02-16: qty 2

## 2021-02-16 MED ORDER — PEGFILGRASTIM-CBQV 6 MG/0.6ML ~~LOC~~ SOSY
PREFILLED_SYRINGE | SUBCUTANEOUS | Status: AC
  Filled 2021-02-16: qty 0.6

## 2021-02-16 MED ORDER — POTASSIUM CHLORIDE IN NACL 20-0.9 MEQ/L-% IV SOLN
INTRAVENOUS | Status: DC
  Filled 2021-02-16: qty 1000

## 2021-02-16 NOTE — Consult Note (Signed)
[name], M.D. Gastroenterology & Hepatology [facility] [address] [place], [place] [address] Primary Care Physician: [name], MD @PCPADDR @  Referring MD: Silver Huguenin Elgergawy  Chief Complaint: Hemoptysis and abnormal CT of the chest  History of Present Illness: [name] is a 65 y.o. 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  The patient was sent from the oncology infusion center after she complained of having recurrent episodes of hemoptysis since yesterday.  She reports that she has had these episodes of hemoptysis in the past but they have been a sporadic.  States that she has presented occasional production of fresh blood when she coughs which he cleans with a tissue.  When I asked her about any gastrointestinal symptoms, she denied having any hematochezia, hematemesis, emesis, rectal bleeding, odynophagia or dysphagia in the past.  She states that the last time she coughed blood was today at 10:30 in the morning.  She reports that she may have had swallowed some of the mucus production after coughing but she cannot recall this clearly.  She denies taking any NSAIDs, antiplatelets or anticoagulants. The patient denies having any  fever, chills, abdominal distention, abdominal pain, diarrhea, jaundice, pruritus or weight loss.  In the ER, the patient was slightly tachycardic to low 100s but with normal blood pressure, she was afebrile.  Was noted to have orthostatic hypotension with blood pressure to 78/54 when standing up from a blood pressure of 126/84.  Initial labs showed a hemoglobin of 9.7, MCV of  99.3, platelets 272 and white blood cell count 7.3.  CMP showed increased BUN of 29 with creatinine of 0.47, normal electrolytes with borderline magnesium of 1.5, liver enzymes were within normal limits.  She underwent a chest x-ray which showed presence of normal lung parenchyma with presence of a left subclavian Port-A-Cath.  A CT angiogram and chest for PE protocol was performed which was negative for PE but it showed new diffuse wall thickening throughout the esophagus from the upper cervical region to the gastroesophageal junction with loss of fat plane, imaging was concerning for diffuse hemorrhage based on radiology evaluation.  Emphysematous changes were seen in the lung parenchyma.  There were multiple small lymph nodes in the chest, as well as a stable left supraclavicular lymph nodes.  Also, there were multiple areas of irregular consolidation in the left upper lobe concerning for possible neoplasia.  Due to these findings, gastroenterology was consulted.  Notably, the patient presented Worsening anemia in recent blood testing performed 2 days ago with a hemoglobin of 5.9, for which she was transfused 2 units of PRBC as an outpatient but she did not have any overt bleeding episodes.  Importantly, she had a CT of the chest 3 days ago which did not show these esophageal alterations.  On a second note, the patient was considered for surgical management of peritoneal carcinomatosis but given her severe lung disease she was not considered a candidate to undergo general anesthesia based on evaluation by anesthesia and pulmonology at [facility].  Last EGD: Never Last Colonoscopy: None  FHx: neg for any gastrointestinal/liver disease, uncle had history of colon cancer Social: Previous heavy smoking but quit after presenting severe COPD, denies alcohol or illicit drug  use  Past Medical History: Past Medical History:  Diagnosis Date  . Anemia   . Asthma   . Cancer [facility])    Gastric Cancer  . COPD  (chronic obstructive pulmonary disease) ([place])   . High cholesterol   . Hypertension   . Neuropathy   . Pneumonia 2019  . Port-A-Cath in place 07/03/2020    Past Surgical History: Past Surgical History:  Procedure Laterality Date  . HALLUX VALGUS BASE WEDGE Right 06/09/2015   Procedure: Base wedge osteotomy with modified McBride right foot ;  Surgeon: Sharlotte Alamo, MD;  Location: [facility] ORS;  Service: Podiatry;  Laterality: Right;  . PORTACATH PLACEMENT Left 06/28/2020   Procedure: PORT-A-CATHETER PLACEMENT LEFT CHEST (attached catheter in left subclavian);  Surgeon: Virl Cagey, MD;  Location: [facility] ORS;  Service: General;  Laterality: Left;    Family History: Family History  Problem Relation Age of Onset  . Alzheimer's disease Mother   . COPD Father   . Emphysema Father   . Hypertension Father   . Healthy Sister   . Healthy Brother   . Alzheimer's disease Maternal Grandmother   . Healthy Sister   . Healthy Sister   . Healthy Sister   . Prostate cancer Other        paternal grandmother's brother; dx in early 26s  . Breast cancer Neg Hx     Social History: Social History   Tobacco Use  Smoking Status Former Smoker  . Packs/day: 2.00  . Years: 30.00  . Pack years: 60.00  . Types: Cigarettes  . Quit date: 11/04/2013  . Years since quitting: 7.2  Smokeless Tobacco Never Used   Social History   Substance and Sexual Activity  Alcohol Use No   Social History   Substance and Sexual Activity  Drug Use No    Allergies: No Known Allergies  Medications: Current Facility-Administered Medications  Medication Dose Route Frequency Provider Last Rate Last Admin  . 0.9 % NaCl with KCl 20 mEq/ L  infusion   Intravenous Continuous Elgergawy, Silver Huguenin, MD      . hydrocortisone sodium succinate (SOLU-CORTEF) 100 MG injection 50 mg  50 mg Intravenous Q8H Elgergawy, Silver Huguenin, MD   50 mg at 02/16/21 1701  . magnesium sulfate IVPB 2 g 50 mL  2 g Intravenous Once Montez Morita, Diania Co, MD      . pantoprazole (PROTONIX) 80 mg in sodium chloride 0.9 % 100 mL (0.8 mg/mL) infusion  8 mg/hr Intravenous Continuous Elgergawy, Silver Huguenin, MD 10 mL/hr at 02/16/21 1717 8 mg/hr at 02/16/21 1717  . pantoprazole (PROTONIX) 80 mg in sodium chloride 0.9 % 100 mL IVPB  80 mg Intravenous Once Elgergawy, Silver Huguenin, MD 300 mL/hr at 02/16/21 1722 80 mg at 02/16/21 1722  . [START ON 02/20/2021] pantoprazole (PROTONIX) injection 40 mg  40 mg Intravenous Q12H Elgergawy, Silver Huguenin, MD       Current Outpatient Medications  Medication Sig Dispense Refill  . acetylcysteine (MUCOMYST) 20 % nebulizer solution Inhale into the lungs.     Marland Kitchen albuterol (PROVENTIL HFA;VENTOLIN HFA) 108 (90 BASE) MCG/ACT inhaler Inhale 4-6 puffs into the lungs every 6 (six) hours as needed for wheezing or shortness of breath.    Marland Kitchen amLODipine (NORVASC) 5 MG tablet Take 5 mg by mouth at bedtime.     Marland Kitchen aspirin EC 81 MG tablet Take 81 mg by mouth daily.    Marland Kitchen CARBOPLATIN IV Inject into the vein every 21 ( twenty-one) days.    Marland Kitchen  ferrous sulfate 325 (65 FE) MG tablet Take 325 mg by mouth every other day.    . Fluticasone-Umeclidin-Vilant (TRELEGY ELLIPTA) 100-62.5-25 MCG/INH AEPB Inhale 1 puff into the lungs 2 (two) times daily.    Marland Kitchen gabapentin (NEURONTIN) 600 MG tablet Take 600 mg by mouth 2 (two) times daily.    Marland Kitchen ipratropium (ATROVENT HFA) 17 MCG/ACT inhaler Inhale 2 puffs into the lungs every 6 (six) hours as needed for wheezing.     Marland Kitchen ipratropium-albuterol (DUONEB) 0.5-2.5 (3) MG/3ML SOLN Take 0.5 mg by nebulization every 6 (six) hours as needed.    . levalbuterol (XOPENEX) 1.25 MG/3ML nebulizer solution Take 1.25 mg by nebulization every 4 (four) hours as needed for shortness of breath or wheezing.    . magnesium oxide (MAG-OX) 400 (241.3 Mg) MG tablet Take 1 tablet (400 mg total) by mouth 2 (two) times daily. 60 tablet 3  . nortriptyline (PAMELOR) 25 MG capsule Take 25 mg by mouth at bedtime.    . ondansetron (ZOFRAN  ODT) 4 MG disintegrating tablet 4mg  ODT q4 hours prn nausea/vomit (Patient taking differently: Take 4 mg by mouth every 4 (four) hours as needed for nausea or vomiting.) 12 tablet 0  . OXYGEN Inhale 3 L into the lungs continuous.     Marland Kitchen PACLITAXEL IV Inject into the vein every 21 ( twenty-one) days.    . pantoprazole (PROTONIX) 40 MG tablet Take 1 tablet (40 mg total) by mouth daily. 30 tablet 2  . predniSONE (DELTASONE) 5 MG tablet Take 5 mg by mouth daily with breakfast.    . prochlorperazine (COMPAZINE) 10 MG tablet Take 1 tablet (10 mg total) by mouth every 6 (six) hours as needed (Nausea or vomiting). 30 tablet 1  . simvastatin (ZOCOR) 40 MG tablet Take 40 mg by mouth at bedtime.    . gabapentin (NEURONTIN) 300 MG capsule Take 300 mg by mouth 3 (three) times daily.  (Patient not taking: Reported on 02/16/2021)    . predniSONE (DELTASONE) 10 MG tablet Take 40 mg by mouth daily. (Patient not taking: No sig reported)      Review of Systems: GENERAL: negative for malaise, night sweats HEENT: No changes in hearing or vision, no nose bleeds or other nasal problems. NECK: Negative for lumps, goiter, pain and significant neck swelling RESPIRATORY: Negative for cough, wheezing CARDIOVASCULAR: Negative for chest pain, leg swelling, palpitations, orthopnea GI: SEE HPI MUSCULOSKELETAL: Negative for joint pain or swelling, back pain, and muscle pain. SKIN: Negative for lesions, rash PSYCH: Negative for sleep disturbance, mood disorder and recent psychosocial stressors. HEMATOLOGY Negative for prolonged bleeding, bruising easily, and swollen nodes. ENDOCRINE: Negative for cold or heat intolerance, polyuria, polydipsia and goiter. NEURO: negative for tremor, gait imbalance, syncope and seizures. The remainder of the review of systems is noncontributory.   Physical Exam: BP 110/62   Pulse (!) 103   Temp 98.7 F (37.1 C) (Oral)   Resp 16   SpO2 96%  GENERAL: The patient is AO x3, in no acute  distress. Elder. HEENT: Head is normocephalic and atraumatic. EOMI are intact. Mouth is well hydrated and without lesions. NECK: Supple. No masses LUNGS: Clear to auscultation. No presence of rhonchi/wheezing/rales. Adequate chest expansion HEART: RRR, normal s1 and s2. Has port-a-cath in chest. ABDOMEN: Soft, nontender, no guarding, no peritoneal signs, and nondistended. BS +. No masses. EXTREMITIES: Without any cyanosis, clubbing, rash, lesions or edema. NEUROLOGIC: AOx3, no focal motor deficit. SKIN: no jaundice, no rashes  Imaging/Labs: as above  I  personally reviewed and interpreted the available labs, imaging and endoscopic files.  Impression and Plan: REKITA MIOTKE is a 64 y.o. 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  Even though the patient has presence of diffuse changes in CT scan, she has not presented any overt signs of bleeding and has not vomited even 1 time.  It is possible that she has presented some episodes of swallowing the hemoptysis and that can lead to blood contents in her gastrointestinal tract, which could elevate her BUN.  Nevertheless, she seems to be hemodynamically stable although she may benefit from further hydration as she is presenting orthostatic vital signs.  As she seems to be otherwise stable without any further drop in her hemoglobin, I discussed with the patient and anesthesia about the need to perform an endoscopic evaluation.  As she is a stable and has not presented any overt gastrointestinal bleeding at the moment, we will discharge this endoscopic evaluation until  tomorrow.  If she does not present any episodes of vomiting, this procedure may be performed under propofol sedation and may not require general anesthesia, which may increase her risk of persistent use of a ventilator given her severe COPD.  For now she will require to get crystalloids IV and continue PPI IV infusion.  We will also give her a dose of Reglan to increase the gastrointestinal clearance of blood.  Finally, as she has evidence of malignant lesions in her lung, this could explain the hemoptysis and may require an evaluation by pulmonology during this hospitalization.  #Possible upper gastrointestinal bleeding #Hemoptysis - Repeat CBC q12h, transfuse if Hb <7 - Pantoprazole ggt - 2 large bore IV lines - Active T/S - CLD, keep NPO after MN - Avoid NSAIDs -Reglan 10 mg IV x1 - Will proceed with EGD tomorrow, possibly under propofol sedation -Consider pulmonology consult  All questions were answered.      [name], MD Gastroenterology and Hepatology [facility]

## 2021-02-16 NOTE — Progress Notes (Signed)
Nutrition Follow-up:  Patient with ovarian cancer.  Receiving chemotherapy.  Patient not surgical candidate due to COPD.    Met with patient and boyfriend after injection.  Patient reports that she does not feel well.  Patient has been running low grade fever recently.  MD aware.  Coughing up blood and MD in to evaluate patient today.  Patient reports just recently moved.  Reports good appetite. Concerned about weight loss.  Has not been drinking carnation breakfast essentials but has been eating ice cream each night. Significant other prepares meals.      Medications: reviewed  Labs: reviewed  Anthropometrics:   Weight 114 lb 9.6 on 3/30  117 lb 14.4 oz on 3/9 120 lb 3.2 oz on 2/16 117 lb on 11/6 115 lb on 12/1   NUTRITION DIAGNOSIS: Unintentional weight loss continues   INTERVENTION:  Encouraged patient to add back carnation breakfast essentials shake at least daily. Continue high calorie, high protein foods Contact information given    MONITORING, EVALUATION, GOAL: weight trends, intake   NEXT VISIT: Monday, April 25 phone call  Vineeth Fell B. Zenia Resides, [place], [place] Registered Dietitian [phone] (mobile)

## 2021-02-16 NOTE — ED Triage Notes (Signed)
Sent from the cancer center for evaluation, states she has been coughing up blood all night

## 2021-02-16 NOTE — ED Notes (Signed)
Pt ambulated to BR with no assistance needed, no difficulty reported

## 2021-02-16 NOTE — Progress Notes (Signed)
Patient tolerated Udencya injection with no complaints voiced.  Site clean and dry with no bruising or swelling noted.  No complaints of pain.  Patient c/o hemoptysis, increased SOB, and was tachycardic at her visit.  Dr. Chryl Heck spoke with patient and advised her to go to the ED for evaluation.  Patient left via wheelchair in stable condition.  ED was notified.

## 2021-02-16 NOTE — ED Provider Notes (Signed)
[facility] Provider Note   CSN: [id number] Arrival date & time: 02/16/21  1314     History Chief Complaint  Patient presents with  . Shortness of Breath    [name] is a 65 y.o. female.  HPI   Patient with significant medical history of chronic anemia, COPD, chronic respiratory failure on 3 L via nasal cannula, ovarian cancer currently on chemotherapy presents to the emergency department with chief complaint of shortness of breath and hemoptysis.  Patient endorses this started yesterday, she states that she has been coughing up some clear sputum with blood mixed in with it, she states the amount of blood will vary sometimes a lot sometimes a little, it is associated with worsening shortness of breath on exertion, denies chest pain, feeling lightheadedness, dizziness, will be coming diaphoretic.  She denies recent sick contacts, denies fevers, chills, general body aches, she endorses that one week ago she was having fevers and chills and was started on a Z-Pak.  She states after the Z-Pak she felt much better.    After reviewing patient's chart patient was recently seen at her oncologist 03/30, she had a CT chest and abdomen which reveals a node-like mass in the left upper lobe differential includes malignancy versus infection, she is scheduled to follow-up with pulmonology for further evaluation.  She also received a blood transfusion yesterday as her hemoglobin was 5.9, she states after blood transfusion she felt much better.  She endorses that she has never had  Hemoptysis  in the past, is not on anticoagulant, has no history of PEs or DVTs, no cardiac history.  Patient denies headaches, fevers, chills, chest pain, abdominal pain, nausea, vomiting, diarrhea, worsening pedal edema.  Past Medical History:  Diagnosis Date  . Anemia   . Asthma   . Cancer [employer])    Gastric Cancer  . COPD (chronic obstructive pulmonary disease) ([place])   . High cholesterol   .  Hypertension   . Neuropathy   . Pneumonia 2019  . Port-A-Cath in place 07/03/2020    Patient Active Problem List   Diagnosis Date Noted  . Upper GI bleed 02/16/2021  . Sepsis due to undetermined organism ([place]) 12/18/2020  . Lobar pneumonia ([place]) 12/18/2020  . Syncope 12/17/2020  . Genetic testing 08/01/2020  . Ovarian cancer ([place]) 07/07/2020  . Port-A-Cath in place 07/03/2020  . Family history of prostate cancer 07/03/2020  . Goals of care, counseling/discussion 07/01/2020  . 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 Pneumonia 09/13/2018  . HCAP (healthcare-associated pneumonia) 01/29/2018  . Acute on chronic respiratory failure ([place]) 01/22/2018  . CAP (community acquired pneumonia) 01/18/2016  . Anemia, iron deficiency 11/07/2015  . Chronic respiratory failure ([place]) 11/07/2015  . Acute on chronic respiratory failure with hypoxia ([place]) 11/07/2015  . Hyperlipidemia 11/07/2015  . COPD with acute exacerbation ([place]) 11/06/2015  . Hypertension 11/06/2015    Past Surgical History:  Procedure Laterality Date  . HALLUX VALGUS BASE WEDGE Right 06/09/2015   Procedure: Base wedge osteotomy with modified McBride right foot ;  Surgeon: Sharlotte Alamo, MD;  Location: [facility] ORS;  Service: Podiatry;  Laterality: Right;  . PORTACATH PLACEMENT Left 06/28/2020   Procedure: PORT-A-CATHETER PLACEMENT LEFT CHEST (attached catheter in left subclavian);  Surgeon: Virl Cagey, MD;  Location: [facility];  Service: General;  Laterality: Left;     OB History   No obstetric history on file.     Family History  Problem Relation Age of Onset  . Alzheimer's  disease Mother   . COPD Father   . Emphysema Father   . Hypertension Father   . Healthy Sister   . Healthy Brother   . Alzheimer's disease Maternal Grandmother   . Healthy Sister   . Healthy Sister   . Healthy Sister   . Prostate cancer Other        paternal grandmother's brother; dx in early 84s  . Breast cancer Neg Hx     Social  History   Tobacco Use  . Smoking status: Former Smoker    Packs/day: 2.00    Years: 30.00    Pack years: 60.00    Types: Cigarettes    Quit date: 11/04/2013    Years since quitting: 7.2  . Smokeless tobacco: Never Used  Vaping Use  . Vaping Use: Never used  Substance Use Topics  . Alcohol use: No  . Drug use: No    Home Medications Prior to Admission medications   Medication Sig Start Date End Date Taking? Authorizing Provider  acetylcysteine (MUCOMYST) 20 % nebulizer solution Inhale into the lungs.  08/14/20 08/14/21 Yes [provider]  albuterol (PROVENTIL HFA;VENTOLIN HFA) 108 (90 BASE) MCG/ACT inhaler Inhale 4-6 puffs into the lungs every 6 (six) hours as needed for wheezing or shortness of breath.   Yes [provider]  amLODipine (NORVASC) 5 MG tablet Take 5 mg by mouth at bedtime.    Yes [provider]  aspirin EC 81 MG tablet Take 81 mg by mouth daily.   Yes [provider]  CARBOPLATIN IV Inject into the vein every 21 ( twenty-one) days. 07/05/20  Yes [provider]  ferrous sulfate 325 (65 FE) MG tablet Take 325 mg by mouth every other day.   Yes [provider]  Fluticasone-Umeclidin-Vilant (TRELEGY ELLIPTA) 100-62.5-25 MCG/INH AEPB Inhale 1 puff into the lungs 2 (two) times daily. 01/21/20  Yes [provider]  gabapentin (NEURONTIN) 600 MG tablet Take 600 mg by mouth 2 (two) times daily. 02/02/21  Yes [provider]  ipratropium (ATROVENT HFA) 17 MCG/ACT inhaler Inhale 2 puffs into the lungs every 6 (six) hours as needed for wheezing.    Yes [provider]  ipratropium-albuterol (DUONEB) 0.5-2.5 (3) MG/3ML SOLN Take 0.5 mg by nebulization every 6 (six) hours as needed. 08/03/12  Yes [provider]  levalbuterol (XOPENEX) 1.25 MG/3ML nebulizer solution Take 1.25 mg by nebulization every 4 (four) hours as needed for shortness of breath or wheezing. 08/21/20  Yes [provider]   magnesium oxide (MAG-OX) 400 (241.3 Mg) MG tablet Take 1 tablet (400 mg total) by mouth 2 (two) times daily. 02/14/21  Yes Derek Jack, MD  nortriptyline (PAMELOR) 25 MG capsule Take 25 mg by mouth at bedtime.   Yes [provider]  ondansetron (ZOFRAN ODT) 4 MG disintegrating tablet 4mg  ODT q4 hours prn nausea/vomit Patient taking differently: Take 4 mg by mouth every 4 (four) hours as needed for nausea or vomiting. 07/08/20  Yes Milton Ferguson, MD  OXYGEN Inhale 3 L into the lungs continuous.    Yes [provider]  PACLITAXEL IV Inject into the vein every 21 ( twenty-one) days. 07/05/20  Yes [provider]  pantoprazole (PROTONIX) 40 MG tablet Take 1 tablet (40 mg total) by mouth daily. 01/28/18  Yes Gladstone Lighter, MD  predniSONE (DELTASONE) 5 MG tablet Take 5 mg by mouth daily with breakfast.   Yes [provider]  prochlorperazine (COMPAZINE) 10 MG tablet Take 1 tablet (  10 mg total) by mouth every 6 (six) hours as needed (Nausea or vomiting). 07/03/20  Yes Derek Jack, MD  simvastatin (ZOCOR) 40 MG tablet Take 40 mg by mouth at bedtime.   Yes [provider]  gabapentin (NEURONTIN) 300 MG capsule Take 300 mg by mouth 3 (three) times daily.  Patient not taking: Reported on 02/16/2021    [provider]  predniSONE (DELTASONE) 10 MG tablet Take 40 mg by mouth daily. Patient not taking: No sig reported 01/04/21   [provider]    Allergies    Patient has no known allergies.  Review of Systems   Review of Systems  Constitutional: Negative for chills and fever.  HENT: Negative for congestion and sore throat.   Eyes: Negative for visual disturbance.  Respiratory: Positive for cough and shortness of breath.   Cardiovascular: Negative for chest pain.  Gastrointestinal: Negative for abdominal pain, diarrhea, nausea and vomiting.  Genitourinary: Negative for enuresis.  Musculoskeletal: Negative for back pain.   Skin: Negative for rash.  Neurological: Negative for dizziness and headaches.  Hematological: Does not bruise/bleed easily.    Physical Exam Updated Vital Signs BP 110/62   Pulse (!) 103   Temp 98.7 F (37.1 C) (Oral)   Resp 16   SpO2 96%   Physical Exam Vitals and nursing note reviewed.  Constitutional:      General: She is not in acute distress.    Appearance: She is not ill-appearing.  HENT:     Head: Normocephalic and atraumatic.     Right Ear: Tympanic membrane, ear canal and external ear normal.     Left Ear: Tympanic membrane, ear canal and external ear normal.     Ears:     Comments: Patient has noted fluid behind her left TM no signs infection present.    Nose: No congestion.     Mouth/Throat:     Mouth: Mucous membranes are dry.     Pharynx: Oropharynx is clear. No oropharyngeal exudate or posterior oropharyngeal erythema.  Eyes:     Conjunctiva/sclera: Conjunctivae normal.  Cardiovascular:     Rate and Rhythm: Normal rate and regular rhythm.     Pulses: Normal pulses.     Heart sounds: No murmur heard. No friction rub. No gallop.   Pulmonary:     Effort: No respiratory distress.     Breath sounds: No wheezing, rhonchi or rales.     Comments: Patient is currently on 3 L via nasal cannula, satting in the mid 90s, lung sounds had intermittent rhonchi heard in the left mid lobe, with intermittent wheezing present without rales Abdominal:     Palpations: Abdomen is soft.     Tenderness: There is no abdominal tenderness.  Musculoskeletal:     Right lower leg: No edema.     Left lower leg: No edema.  Skin:    General: Skin is warm and dry.  Neurological:     Mental Status: She is alert.  Psychiatric:        Mood and Affect: Mood normal.     ED Results / Procedures / Treatments   Labs (all labs ordered are listed, but only abnormal results are displayed) Labs Reviewed  COMPREHENSIVE METABOLIC PANEL - Abnormal; Notable for the following components:       Result Value   Chloride 93 (*)    Glucose, Bld 104 (*)    BUN 29 (*)    Albumin 3.2 (*)    All other components within  normal limits  CBC WITH DIFFERENTIAL/PLATELET - Abnormal; Notable for the following components:   RBC 3.04 (*)    Hemoglobin 9.7 (*)    HCT 30.2 (*)    RDW 19.4 (*)    All other components within normal limits  MAGNESIUM  POC SARS CORONAVIRUS 2 AG -  ED  TROPONIN I (HIGH SENSITIVITY)    EKG EKG Interpretation  Date/Time:  Friday February 16 2021 13:40:13 EDT Ventricular Rate:  100 PR Interval:  155 QRS Duration: 87 QT Interval:  311 QTC Calculation: 401 R Axis:   237 Text Interpretation: Ectopic atrial tachycardia, unifocal S1,S2,S3 pattern Low voltage, precordial leads Nonspecific T abnrm, anterolateral leads No significant change since last tracing Confirmed by Isla Pence [phone]) on 02/16/2021 2:00:20 PM   Radiology CT Angio Chest PE W and/or Wo Contrast  Result Date: 02/16/2021 CLINICAL DATA:  Hemoptysis.  Ovarian carcinoma EXAM: CT ANGIOGRAPHY CHEST WITH CONTRAST TECHNIQUE: Multidetector CT imaging of the chest was performed using the standard protocol during bolus administration of intravenous contrast. Multiplanar CT image reconstructions and MIPs were obtained to evaluate the vascular anatomy. CONTRAST:  45mL OMNIPAQUE IOHEXOL 350 MG/ML SOLN COMPARISON:  Chest radiograph February 16, 2021; chest CT February 12, 2021 FINDINGS: Cardiovascular: There is no demonstrable pulmonary embolus. There is no thoracic aortic aneurysm or dissection. There are scattered foci of calcification in visualized great vessels. There is aortic atherosclerosis. There are foci of coronary artery calcification. There is no pericardial effusion or pericardial thickening. Mediastinum/Nodes: Thyroid appears normal. There are multiple stable subcentimeter mediastinal lymph nodes. No new adenopathy is evident. In comparison with recent study, there is now diffuse wall thickening throughout the  esophagus extending from the upper cervical region to the gastroesophageal junction. There is loss of fat plane surrounding the esophagus. This rapid change from 4 days prior and history of hemoptysis/hematemesis suggests that there may be diffuse hemorrhage within the esophagus. No pneumomediastinum is evident. Lungs/Pleura: There are underlying emphysematous changes. There are bronchiectatic changes in each lower lobe, stable. There are multiple areas of irregular consolidation in the left upper lobe toward the apex, stable, concerning for potential neoplastic foci. There is atelectatic change in the posterior left base, stable. No new parenchymal lung lesions are evident compared to 4 days prior. Upper Abdomen: Visualized upper abdominal structures appear unremarkable and stable. Musculoskeletal: No blastic or lytic bone lesions. No chest wall lesions evident. Review of the MIP images confirms the above findings. IMPRESSION: 1. No demonstrable pulmonary embolus. No thoracic aortic aneurysm or dissection. Aortic atherosclerosis as well as foci of great vessel and coronary artery calcification noted. 2. In comparison with 4 days prior, there is now diffuse esophageal wall thickening and relative enlargement of the esophageal contour extending from the upper thoracic esophagus to the gastroesophageal junction. Loss of fat plane throughout the esophageal region. Suspect diffuse esophageal hemorrhage given this appearance in the history. No pneumomediastinum. Etiology for esophageal hemorrhage uncertain. Neoplastic involvement in the esophagus potentially could cause hemorrhage. Hemorrhage secondary to underlying inflammation or potential chemotherapy response possible. This finding may warrant direct visualization of the esophagus. 3. Underlying emphysema. Areas of bronchiectatic change, stable. Multiple areas of opacity in the left upper lobe raise concern for underlying neoplasia. Appearance stable compared to 4  days prior. Atelectatic change noted in the left lung base. No new opacity evident. 4. Multiple small lymph nodes. No frank adenopathy evident by size criteria. A stable lymph node in the left supraclavicular region, upper limits of normal in size  potentially could have neoplastic etiology as noted in recent report. Aortic Atherosclerosis (ICD10-I70.0) and Emphysema (ICD10-J43.9). Electronically Signed   By: Lowella Grip III M.D.   On: 02/16/2021 15:51   DG Chest Port 1 View  Result Date: 02/16/2021 CLINICAL DATA:  65 year old female with cough and hemoptysis. EXAM: PORTABLE CHEST - 1 VIEW COMPARISON:  01/30/2021, 02/12/2021 FINDINGS: The mediastinal contours are within normal limits. No cardiomegaly. Similar appearing left greater than right apical scarring. No new focal consolidation. No evidence of significant pleural effusion or pneumothorax. Left subclavian vein approach Port-A-Cath in place with the catheter tip at the cavoatrial junction. No acute osseous abnormality. IMPRESSION: Similar-appearing left greater than right apical scarring. No new focal consolidative process. Electronically Signed   By: Ruthann Cancer MD   On: 02/16/2021 14:50    Procedures Procedures   Medications Ordered in ED Medications  metoCLOPramide (REGLAN) injection 10 mg (has no administration in time range)  pantoprazole (PROTONIX) 80 mg in sodium chloride 0.9 % 100 mL IVPB (has no administration in time range)  pantoprazole (PROTONIX) 80 mg in sodium chloride 0.9 % 100 mL (0.8 mg/mL) infusion (has no administration in time range)  pantoprazole (PROTONIX) injection 40 mg (has no administration in time range)  0.9 % NaCl with KCl 20 mEq/ L  infusion (has no administration in time range)  iohexol (OMNIPAQUE) 350 MG/ML injection 75 mL (75 mLs Intravenous Contrast Given 02/16/21 1517)    ED Course  I have reviewed the triage vital signs and the nursing notes.  Pertinent labs & imaging results that were available  during my care of the patient were reviewed by me and considered in my medical decision making (see chart for details).    MDM Rules/Calculators/A&P                         Initial impression-patient presents with shortness of breath and blood in her sputum, she is alert, does not appear in acute distress, vital signs notable for tachycardia.  Concern for possible PE, will obtain chest pain work-up, chest x-ray, CTA chest and reassess.  Work-up-CBC shows normocytic anemia 9.7 increased from 2 days ago.  CMP shows hyperglycemia 104, BUN elevated 29, Albumin 3.2.  First troponin is 5.  Chest x-ray reveals similar-appearing left greater than right atypical scarring with no new focal consolidations present.  EKG is sinus rhythm without signs of ischemia.  CTA chest negative for PE reveals diffuse esophagus wall thickening consistent with esophagus hemorrhage differential diagnosis includes inflammatory or chemotherapy recommends possible direct visitation of the esophagus for further evaluation.  Reassessment patient was reassessed, has no complaints at this time, updated patient on lab work and imaging due to concerning findings on CT of chest will consult with GI for further recommendations to see if endoscopy s needed to be done inpatient versus outpatient.   Consult consulted with GI due to esophagus hemorrhage spoke with Dr. Jenetta Downer he would like the patient admitted to medicine and he will scope the patient later today further evaluation.   Spoke with Dr. Waldron Labs the hospitalist team, he has accepted patient come down evaluate.   Rule out- I have low suspicion for ACS as history is atypical, patient has no cardiac history, EKG was sinus rhythm without signs of ischemia, patient first troponin is 5, will defer second troponin as patient has been chest pain-free for greater than 12 hours.  Would expect elevation in troponin if ACS was present.  Low suspicion  for symptomatic anemia as hemoglobin  is within normal limits.  Low suspicion for low suspicion for PE as CT of chest is negative, low suspicion for AAA or aortic dissection as history is atypical, patient has low risk factors.  Low suspicion for systemic infection as patient is nontoxic-appearing, vital signs reassuring, no obvious source infection noted on exam.   Plan-admit patient to medicine due to esophagus hemorrhage with unknown etiology, patient will have an endoscopy performed later today for further evaluation.     Final Clinical Impression(s) / ED Diagnoses Final diagnoses:  Hemorrhage of esophagus  Hemoptysis  SOB (shortness of breath)    Rx / DC Orders ED Discharge Orders    None       Marcello Fennel, PA-C 02/16/21 1637    Isla Pence, MD 02/19/21 [phone]

## 2021-02-16 NOTE — ED Notes (Signed)
Boyfriend wants to be contacted at any time when pt is moved to another room or if a procedure is scheduled.   [place]

## 2021-02-16 NOTE — H&P (Signed)
TRH H&P   Patient Demographics:    [name], is a 65 y.o. female  MRN: [id number]   DOB - 1956/03/30  Admit Date - 02/16/2021  Outpatient Primary MD for the patient is Adamo, Hattie Perch, MD  Referring MD/NP/PA: Merlene Laughter  Outpatient Specialists: oncology [name]  Patient coming from: home  Chief Complaint  Patient presents with  . Shortness of Breath      HPI:    [name]  is a 65 y.o. female, 65 y.o.femalewith medical history significant ofovarian cancer on chemo, COPD, hypertension, hyperlipidemia, chronic respiratory failure on 3 L of oxygen continuous at home , patient presents to ED secondary to complaints of shortness of breath and hemoptysis, patient reports symptoms has been started yesterday, ports started coughing up some clear sputum mixed with blood with it, the amount of blood is variable, does report shortness of breath, mainly with exertion, she denies any chest pain, lightheadedness, dizziness, patient was at her oncologist follow-up 02/14/2021, hemoglobin has been noticed for 5.9, he was transfused 2 unit PRBC, by then she had complaints of nosebleed, reports normal brown color bowel movement.  She reports she is using aspirin daily, reports she is rarely using Alieve, only 1 tablet over last month. - in ED patient had CTA chest, which was negative for PE, but significant for findings suspicious of esophageal bleed, at 9.7, potassium 3.5, creatinine 0.47, [facility] hospitalist consulted to admit.   Review of systems:    In addition to the HPI above,  No Fever-chills, reports generalized weakness  no Headache, No changes with Vision or hearing, No problems swallowing food or Liquids, No Chest pain, Cough, she reports dyspnea and hemoptysis No Abdominal pain, No Nausea or Vommitting, Bowel movements are regular, No Blood in stool or Urine, No dysuria, No  new skin rashes or bruises, No new joints pains-aches,  No new weakness, tingling, numbness in any extremity, No recent weight gain or loss, No polyuria, polydypsia or polyphagia, No significant Mental Stressors.  A full 10 point Review of Systems was done, except as stated above, all other Review of Systems were negative.   With Past History of the following :    Past Medical History:  Diagnosis Date  . Anemia   . Asthma   . Cancer [facility])    Gastric Cancer  . COPD (chronic obstructive pulmonary disease) ([place])   . High cholesterol   . Hypertension   . Neuropathy   . Pneumonia 2019  . Port-A-Cath in place 07/03/2020      Past Surgical History:  Procedure Laterality Date  . HALLUX VALGUS BASE WEDGE Right 06/09/2015   Procedure: Base wedge osteotomy with modified McBride right foot ;  Surgeon: Sharlotte Alamo, MD;  Location: [facility] ORS;  Service: Podiatry;  Laterality: Right;  . PORTACATH PLACEMENT Left 06/28/2020   Procedure: PORT-A-CATHETER PLACEMENT LEFT CHEST (attached catheter in left  subclavian);  Surgeon: Virl Cagey, MD;  Location: [facility] ORS;  Service: General;  Laterality: Left;      Social History:     Social History   Tobacco Use  . Smoking status: Former Smoker    Packs/day: 2.00    Years: 30.00    Pack years: 60.00    Types: Cigarettes    Quit date: 11/04/2013    Years since quitting: 7.2  . Smokeless tobacco: Never Used  Substance Use Topics  . Alcohol use: No       Family History :     Family History  Problem Relation Age of Onset  . Alzheimer's disease Mother   . COPD Father   . Emphysema Father   . Hypertension Father   . Healthy Sister   . Healthy Brother   . Alzheimer's disease Maternal Grandmother   . Healthy Sister   . Healthy Sister   . Healthy Sister   . Prostate cancer Other        paternal grandmother's brother; dx in early 59s  . Breast cancer Neg Hx      Home Medications:   Prior to Admission medications   Medication Sig  Start Date End Date Taking? Authorizing Provider  acetylcysteine (MUCOMYST) 20 % nebulizer solution Inhale into the lungs.  08/14/20 08/14/21 Yes [provider]  albuterol (PROVENTIL HFA;VENTOLIN HFA) 108 (90 BASE) MCG/ACT inhaler Inhale 4-6 puffs into the lungs every 6 (six) hours as needed for wheezing or shortness of breath.   Yes [provider]  amLODipine (NORVASC) 5 MG tablet Take 5 mg by mouth at bedtime.    Yes [provider]  aspirin EC 81 MG tablet Take 81 mg by mouth daily.   Yes [provider]  CARBOPLATIN IV Inject into the vein every 21 ( twenty-one) days. 07/05/20  Yes [provider]  ferrous sulfate 325 (65 FE) MG tablet Take 325 mg by mouth every other day.   Yes [provider]  Fluticasone-Umeclidin-Vilant (TRELEGY ELLIPTA) 100-62.5-25 MCG/INH AEPB Inhale 1 puff into the lungs 2 (two) times daily. 01/21/20  Yes [provider]  gabapentin (NEURONTIN) 600 MG tablet Take 600 mg by mouth 2 (two) times daily. 02/02/21  Yes [provider]  ipratropium (ATROVENT HFA) 17 MCG/ACT inhaler Inhale 2 puffs into the lungs every 6 (six) hours as needed for wheezing.    Yes [provider]  ipratropium-albuterol (DUONEB) 0.5-2.5 (3) MG/3ML SOLN Take 0.5 mg by nebulization every 6 (six) hours as needed. 08/03/12  Yes [provider]  levalbuterol (XOPENEX) 1.25 MG/3ML nebulizer solution Take 1.25 mg by nebulization every 4 (four) hours as needed for shortness of breath or wheezing. 08/21/20  Yes [provider]  magnesium oxide (MAG-OX) 400 (241.3 Mg) MG tablet Take 1 tablet (400 mg total) by mouth 2 (two) times daily. 02/14/21  Yes Derek Jack, MD  nortriptyline (PAMELOR) 25 MG capsule Take 25 mg by mouth at bedtime.   Yes [provider]  ondansetron (ZOFRAN ODT) 4 MG disintegrating tablet 4mg  ODT q4 hours prn nausea/vomit Patient taking differently: Take 4 mg by mouth every 4 (four)  hours as needed for nausea or vomiting. 07/08/20  Yes Milton Ferguson, MD  OXYGEN Inhale 3 L into the lungs continuous.    Yes [provider]  PACLITAXEL IV Inject into the vein every 21 ( twenty-one) days. 07/05/20  Yes [provider]  pantoprazole (PROTONIX) 40 MG tablet Take 1 tablet (40 mg total)  by mouth daily. 01/28/18  Yes Gladstone Lighter, MD  predniSONE (DELTASONE) 5 MG tablet Take 5 mg by mouth daily with breakfast.   Yes [provider]  prochlorperazine (COMPAZINE) 10 MG tablet Take 1 tablet (10 mg total) by mouth every 6 (six) hours as needed (Nausea or vomiting). 07/03/20  Yes Derek Jack, MD  simvastatin (ZOCOR) 40 MG tablet Take 40 mg by mouth at bedtime.   Yes [provider]  gabapentin (NEURONTIN) 300 MG capsule Take 300 mg by mouth 3 (three) times daily.  Patient not taking: Reported on 02/16/2021    [provider]  predniSONE (DELTASONE) 10 MG tablet Take 40 mg by mouth daily. Patient not taking: No sig reported 01/04/21   [provider]     Allergies:    No Known Allergies   Physical Exam:   Vitals  Blood pressure 110/62, pulse (!) 103, temperature 98.7 F (37.1 C), temperature source Oral, resp. rate 16, SpO2 96 %.   1. General frail female, laying in bed, in no apparent distress  2. Normal affect and insight, Not Suicidal or Homicidal, Awake Alert, Oriented X 3.  3. No F.N deficits, ALL C.Nerves Intact, Strength 5/5 all 4 extremities, Sensation intact all 4 extremities, Plantars down going.  4. Ears and Eyes appear Normal, Conjunctivae clear, PERRLA. Moist Oral Mucosa.  5. Supple Neck, No JVD, No cervical lymphadenopathy appriciated, No Carotid Bruits.  6. Symmetrical Chest wall movement, Good air movement bilaterally, CTAB.  7. RRR, No Gallops, Rubs or Murmurs, No Parasternal Heave.  8. Positive Bowel Sounds, Abdomen Soft, No tenderness,No rebound -guarding or rigidity.  9.  No Cyanosis,  Normal Skin Turgor, No Skin Rash or Bruise.  10. Good muscle tone,  joints appear normal , no effusions, Normal ROM.    Data Review:    CBC Recent Labs  Lab 02/14/21 0822 02/16/21 1401  WBC 3.7* 7.3  HGB 5.9* 9.7*  HCT 18.9* 30.2*  PLT 183 272  MCV 108.6* 99.3  MCH 33.9 31.9  MCHC 31.2 32.1  RDW 19.7* 19.4*  LYMPHSABS 1.3 1.1  MONOABS 0.4 0.4  EOSABS 0.0 0.0  BASOSABS 0.0 0.0   ------------------------------------------------------------------------------------------------------------------  Chemistries  Recent Labs  Lab 02/14/21 0822 02/16/21 1401  NA 138 135  K 3.4* 3.5  CL 97* 93*  CO2 31 31  GLUCOSE 91 104*  BUN 7* 29*  CREATININE 0.50 0.47  CALCIUM 8.5* 9.1  MG 1.5*  --   AST 16 15  ALT 8 9  ALKPHOS 69 62  BILITOT 0.1* 0.5   ------------------------------------------------------------------------------------------------------------------ estimated creatinine clearance is 52.4 mL/min (by C-G formula based on SCr of 0.47 mg/dL). ------------------------------------------------------------------------------------------------------------------ No results for input(s): TSH, T4TOTAL, T3FREE, THYROIDAB in the last 72 hours.  Invalid input(s): FREET3  Coagulation profile No results for input(s): INR, PROTIME in the last 168 hours. ------------------------------------------------------------------------------------------------------------------- No results for input(s): DDIMER in the last 72 hours. -------------------------------------------------------------------------------------------------------------------  Cardiac Enzymes No results for input(s): CKMB, TROPONINI, MYOGLOBIN in the last 168 hours.  Invalid input(s): CK ------------------------------------------------------------------------------------------------------------------    Component Value Date/Time   BNP 39.0 01/22/2018 1210      ---------------------------------------------------------------------------------------------------------------  Urinalysis    Component Value Date/Time   COLORURINE YELLOW 01/30/2021 1350   APPEARANCEUR HAZY (A) 01/30/2021 1350   APPEARANCEUR Clear 10/08/2013 2007   LABSPEC 1.024 01/30/2021 1350   LABSPEC 1.003 10/08/2013 2007   PHURINE 5.0 01/30/2021 1350   GLUCOSEU NEGATIVE 01/30/2021 1350   GLUCOSEU Negative 10/08/2013 2007   HGBUR NEGATIVE  01/30/2021 [place] 01/30/2021 1350   BILIRUBINUR Negative 10/08/2013 2007   KETONESUR NEGATIVE 01/30/2021 1350   PROTEINUR 100 (A) 01/30/2021 1350   NITRITE NEGATIVE 01/30/2021 1350   LEUKOCYTESUR TRACE (A) 01/30/2021 1350   LEUKOCYTESUR Negative 10/08/2013 2007    ----------------------------------------------------------------------------------------------------------------   Imaging Results:    CT Angio Chest PE W and/or Wo Contrast  Result Date: 02/16/2021 CLINICAL DATA:  Hemoptysis.  Ovarian carcinoma EXAM: CT ANGIOGRAPHY CHEST WITH CONTRAST TECHNIQUE: Multidetector CT imaging of the chest was performed using the standard protocol during bolus administration of intravenous contrast. Multiplanar CT image reconstructions and MIPs were obtained to evaluate the vascular anatomy. CONTRAST:  60mL OMNIPAQUE IOHEXOL 350 MG/ML SOLN COMPARISON:  Chest radiograph February 16, 2021; chest CT February 12, 2021 FINDINGS: Cardiovascular: There is no demonstrable pulmonary embolus. There is no thoracic aortic aneurysm or dissection. There are scattered foci of calcification in visualized great vessels. There is aortic atherosclerosis. There are foci of coronary artery calcification. There is no pericardial effusion or pericardial thickening. Mediastinum/Nodes: Thyroid appears normal. There are multiple stable subcentimeter mediastinal lymph nodes. No new adenopathy is evident. In comparison with recent study, there is now diffuse wall  thickening throughout the esophagus extending from the upper cervical region to the gastroesophageal junction. There is loss of fat plane surrounding the esophagus. This rapid change from 4 days prior and history of hemoptysis/hematemesis suggests that there may be diffuse hemorrhage within the esophagus. No pneumomediastinum is evident. Lungs/Pleura: There are underlying emphysematous changes. There are bronchiectatic changes in each lower lobe, stable. There are multiple areas of irregular consolidation in the left upper lobe toward the apex, stable, concerning for potential neoplastic foci. There is atelectatic change in the posterior left base, stable. No new parenchymal lung lesions are evident compared to 4 days prior. Upper Abdomen: Visualized upper abdominal structures appear unremarkable and stable. Musculoskeletal: No blastic or lytic bone lesions. No chest wall lesions evident. Review of the MIP images confirms the above findings. IMPRESSION: 1. No demonstrable pulmonary embolus. No thoracic aortic aneurysm or dissection. Aortic atherosclerosis as well as foci of great vessel and coronary artery calcification noted. 2. In comparison with 4 days prior, there is now diffuse esophageal wall thickening and relative enlargement of the esophageal contour extending from the upper thoracic esophagus to the gastroesophageal junction. Loss of fat plane throughout the esophageal region. Suspect diffuse esophageal hemorrhage given this appearance in the history. No pneumomediastinum. Etiology for esophageal hemorrhage uncertain. Neoplastic involvement in the esophagus potentially could cause hemorrhage. Hemorrhage secondary to underlying inflammation or potential chemotherapy response possible. This finding may warrant direct visualization of the esophagus. 3. Underlying emphysema. Areas of bronchiectatic change, stable. Multiple areas of opacity in the left upper lobe raise concern for underlying neoplasia.  Appearance stable compared to 4 days prior. Atelectatic change noted in the left lung base. No new opacity evident. 4. Multiple small lymph nodes. No frank adenopathy evident by size criteria. A stable lymph node in the left supraclavicular region, upper limits of normal in size potentially could have neoplastic etiology as noted in recent report. Aortic Atherosclerosis (ICD10-I70.0) and Emphysema (ICD10-J43.9). Electronically Signed   By: Lowella Grip III M.D.   On: 02/16/2021 15:51   DG Chest Port 1 View  Result Date: 02/16/2021 CLINICAL DATA:  65 year old female with cough and hemoptysis. EXAM: PORTABLE CHEST - 1 VIEW COMPARISON:  01/30/2021, 02/12/2021 FINDINGS: The mediastinal contours are within normal limits. No cardiomegaly. Similar appearing left greater than right  apical scarring. No new focal consolidation. No evidence of significant pleural effusion or pneumothorax. Left subclavian vein approach Port-A-Cath in place with the catheter tip at the cavoatrial junction. No acute osseous abnormality. IMPRESSION: Similar-appearing left greater than right apical scarring. No new focal consolidative process. Electronically Signed   By: Ruthann Cancer MD   On: 02/16/2021 14:50    My personal review of EKG: Rhythm NSR, Rate  100 /min, QTc 401 , no Acute ST changes   Assessment & Plan:    Active Problems:   Hypertension   Anemia, iron deficiency   Chronic respiratory failure (HCC)   Hyperlipidemia   Peritoneal carcinomatosis (HCC)   Upper GI bleed  Upper GI bleed -Presents with hemoptysis(likely hematemesis), chest significant for blood in esophagus. -GI consulted by ED, plan for endoscopy. -Keep n.p.o., keep on IV fluids, will keep active type and screen, will transfuse for hemoglobin less than 7, will start on insulin drip. -Hold her aspirin. -Keep on SCD for DVT prophylaxis  Chronic respiratory failure with hypoxia -Baseline, on 3 L nasal cannula  COPD -No active wheezing,  continue with home inhalers and as needed nebs. -At baseline she is on prednisone 5 mg daily. -will Need stress dose steroids going for procedure, and her blood pressure is borderline.  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 -He is currently on chemotherapy, reports last session was yesterday  Hypertension -will hold Norvasc due to soft blood pressure  Hyperlipidemia -Continue with statin    DVT Prophylaxis  SCDs AM Labs Ordered, also please review Full Orders  Family Communication: Admission, patients condition and plan of care including tests being ordered have been discussed with the patient  who indicate understanding and agree with the plan and Code Status.  Code Status Full  Likely DC to  home  Condition GUARDED    Consults called: GI     Admission status: observation    Time spent in minutes : 60 minutes   Phillips Climes M.D on 02/16/2021 at 4:34 PM   [facility] - Office  [phone]

## 2021-02-17 ENCOUNTER — Encounter (HOSPITAL_COMMUNITY)
Admission: EM | Disposition: A | Discharge status: Home / Self Care | Payer: Self-pay | Source: Home / Self Care | Attending: Emergency Medicine

## 2021-02-17 ENCOUNTER — Observation Stay (HOSPITAL_COMMUNITY): Payer: Medicare HMO | Admitting: Anesthesiology

## 2021-02-17 DIAGNOSIS — K2289 Other specified disease of esophagus: Secondary | ICD-10-CM | POA: Diagnosis not present

## 2021-02-17 DIAGNOSIS — D508 Other iron deficiency anemias: Secondary | ICD-10-CM

## 2021-02-17 DIAGNOSIS — K3189 Other diseases of stomach and duodenum: Secondary | ICD-10-CM | POA: Diagnosis not present

## 2021-02-17 DIAGNOSIS — K208 Other esophagitis without bleeding: Secondary | ICD-10-CM | POA: Diagnosis not present

## 2021-02-17 DIAGNOSIS — I10 Essential (primary) hypertension: Secondary | ICD-10-CM | POA: Diagnosis not present

## 2021-02-17 DIAGNOSIS — J9611 Chronic respiratory failure with hypoxia: Secondary | ICD-10-CM | POA: Diagnosis not present

## 2021-02-17 DIAGNOSIS — C786 Secondary malignant neoplasm of retroperitoneum and peritoneum: Secondary | ICD-10-CM

## 2021-02-17 DIAGNOSIS — R933 Abnormal findings on diagnostic imaging of other parts of digestive tract: Secondary | ICD-10-CM | POA: Diagnosis not present

## 2021-02-17 DIAGNOSIS — E785 Hyperlipidemia, unspecified: Secondary | ICD-10-CM

## 2021-02-17 DIAGNOSIS — D500 Iron deficiency anemia secondary to blood loss (chronic): Secondary | ICD-10-CM

## 2021-02-17 DIAGNOSIS — J45909 Unspecified asthma, uncomplicated: Secondary | ICD-10-CM | POA: Diagnosis not present

## 2021-02-17 HISTORY — PX: ESOPHAGOGASTRODUODENOSCOPY (EGD) WITH PROPOFOL: SHX5813

## 2021-02-17 LAB — COMPREHENSIVE METABOLIC PANEL WITH GFR
ALT: 8 U/L (ref 0–44)
AST: 12 U/L — ABNORMAL LOW (ref 15–41)
Albumin: 3.1 g/dL — ABNORMAL LOW (ref 3.5–5.0)
Alkaline Phosphatase: 59 U/L (ref 38–126)
Anion gap: 9 (ref 5–15)
BUN: 15 mg/dL (ref 8–23)
CO2: 30 mmol/L (ref 22–32)
Calcium: 8.7 mg/dL — ABNORMAL LOW (ref 8.9–10.3)
Chloride: 101 mmol/L (ref 98–111)
Creatinine, Ser: 0.36 mg/dL — ABNORMAL LOW (ref 0.44–1.00)
GFR, Estimated: >60 mL/min
Glucose, Bld: 99 mg/dL (ref 70–99)
Potassium: 4 mmol/L (ref 3.5–5.1)
Sodium: 140 mmol/L (ref 135–145)
Total Bilirubin: 0.6 mg/dL (ref 0.3–1.2)
Total Protein: 6.1 g/dL — ABNORMAL LOW (ref 6.5–8.1)

## 2021-02-17 LAB — CBC
HCT: 28.3 % — ABNORMAL LOW (ref 36.0–46.0)
Hemoglobin: 8.9 g/dL — ABNORMAL LOW (ref 12.0–15.0)
MCH: 32.1 pg (ref 26.0–34.0)
MCHC: 31.4 g/dL (ref 30.0–36.0)
MCV: 102.2 fL — ABNORMAL HIGH (ref 80.0–100.0)
Platelets: 260 10*3/uL (ref 150–400)
RBC: 2.77 MIL/uL — ABNORMAL LOW (ref 3.87–5.11)
RDW: 19.5 % — ABNORMAL HIGH (ref 11.5–15.5)
WBC: 16.2 10*3/uL — ABNORMAL HIGH (ref 4.0–10.5)
nRBC: 0 % (ref 0.0–0.2)

## 2021-02-17 LAB — HIV ANTIBODY (ROUTINE TESTING W REFLEX): HIV Screen 4th Generation wRfx: NONREACTIVE

## 2021-02-17 SURGERY — ESOPHAGOGASTRODUODENOSCOPY (EGD) WITH PROPOFOL
Anesthesia: General

## 2021-02-17 MED ORDER — MOMETASONE FURO-FORMOTEROL FUM 100-5 MCG/ACT IN AERO
2.0000 | INHALATION_SPRAY | Freq: Two times a day (BID) | RESPIRATORY_TRACT | Status: DC
  Administered 2021-02-17 – 2021-02-18 (×3): 2 via RESPIRATORY_TRACT
  Filled 2021-02-17: qty 8.8

## 2021-02-17 MED ORDER — LIDOCAINE HCL (PF) 2 % IJ SOLN
INTRAMUSCULAR | Status: DC | PRN
  Administered 2021-02-17: 60 mg via INTRADERMAL

## 2021-02-17 MED ORDER — STERILE WATER FOR IRRIGATION IR SOLN
Status: DC | PRN
  Administered 2021-02-17: 200 mL

## 2021-02-17 MED ORDER — PHENYLEPHRINE 40 MCG/ML (10ML) SYRINGE FOR IV PUSH (FOR BLOOD PRESSURE SUPPORT)
PREFILLED_SYRINGE | INTRAVENOUS | Status: DC | PRN
  Administered 2021-02-17: 80 ug via INTRAVENOUS
  Administered 2021-02-17 (×2): 120 ug via INTRAVENOUS

## 2021-02-17 MED ORDER — LACTATED RINGERS IV SOLN: INTRAVENOUS | Status: DC

## 2021-02-17 MED ORDER — PROPOFOL 500 MG/50ML IV EMUL
INTRAVENOUS | Status: DC | PRN
  Administered 2021-02-17: 150 ug/kg/min via INTRAVENOUS

## 2021-02-17 MED ORDER — PROPOFOL 10 MG/ML IV BOLUS
INTRAVENOUS | Status: AC
  Filled 2021-02-17: qty 40

## 2021-02-17 MED ORDER — PROPOFOL 10 MG/ML IV BOLUS
INTRAVENOUS | Status: DC | PRN
  Administered 2021-02-17 (×2): 50 mg via INTRAVENOUS

## 2021-02-17 MED ORDER — CHLORHEXIDINE GLUCONATE CLOTH 2 % EX PADS
6.0000 | MEDICATED_PAD | Freq: Every day | CUTANEOUS | Status: DC
  Administered 2021-02-17 – 2021-02-18 (×2): 6 via TOPICAL

## 2021-02-17 MED ORDER — LIDOCAINE HCL (PF) 2 % IJ SOLN
INTRAMUSCULAR | Status: AC
  Filled 2021-02-17: qty 5

## 2021-02-17 MED ORDER — PREDNISONE 10 MG PO TABS
5.0000 mg | ORAL_TABLET | Freq: Every day | ORAL | Status: DC
  Administered 2021-02-17 – 2021-02-18 (×2): 5 mg via ORAL
  Filled 2021-02-17 (×2): qty 1

## 2021-02-17 MED ORDER — FLUTICASONE-UMECLIDIN-VILANT 100-62.5-25 MCG/INH IN AEPB: 1.0000 | INHALATION_SPRAY | Freq: Two times a day (BID) | RESPIRATORY_TRACT | Status: DC

## 2021-02-17 MED ORDER — SIMVASTATIN 20 MG PO TABS
40.0000 mg | ORAL_TABLET | Freq: Every day | ORAL | Status: DC
  Administered 2021-02-17: 40 mg via ORAL
  Filled 2021-02-17: qty 2

## 2021-02-17 MED ORDER — NORTRIPTYLINE HCL 25 MG PO CAPS
25.0000 mg | ORAL_CAPSULE | Freq: Every day | ORAL | Status: DC
  Administered 2021-02-17: 25 mg via ORAL
  Filled 2021-02-17: qty 1

## 2021-02-17 MED ORDER — UMECLIDINIUM BROMIDE 62.5 MCG/INH IN AEPB
1.0000 | INHALATION_SPRAY | Freq: Every day | RESPIRATORY_TRACT | Status: DC
  Administered 2021-02-17 – 2021-02-18 (×2): 1 via RESPIRATORY_TRACT
  Filled 2021-02-17: qty 7

## 2021-02-17 MED ORDER — IPRATROPIUM-ALBUTEROL 0.5-2.5 (3) MG/3ML IN SOLN
0.5000 mL | Freq: Four times a day (QID) | RESPIRATORY_TRACT | Status: DC | PRN
  Administered 2021-02-17: 3 mL via RESPIRATORY_TRACT
  Filled 2021-02-17: qty 3

## 2021-02-17 NOTE — Op Note (Signed)
[facility] Patient Name: [name] Procedure Date: 02/17/2021 9:42 AM MRN: [id number] Date of Birth: 1956-09-10 Attending MD: [name] ,  CSN: [id number] Age: 65 Admit Type: Outpatient Procedure:                Upper GI endoscopy Indications:              Abnormal CT of the GI tract concerning for                            esophageal hemorrhage, hemoptysis Providers:                [name], Lurline Del, RN, Casimer Bilis, Technician Referring MD:              Medicines:                Monitored Anesthesia Care Complications:            No immediate complications. Estimated Blood Loss:     Estimated blood loss: none. Procedure:                Pre-Anesthesia Assessment:                           - Prior to the procedure, a History and Physical                            was performed, and patient medications, allergies                            and sensitivities were reviewed. The patient's                            tolerance of previous anesthesia was reviewed.                           - The risks and benefits of the procedure and the                            sedation options and risks were discussed with the                            patient. All questions were answered and informed                            consent was obtained.                           - ASA Grade Assessment: IV - A patient with severe                            systemic disease that is a constant threat to life.                           After  obtaining informed consent, the endoscope was                            passed under direct vision. Throughout the                            procedure, the patient's blood pressure, pulse, and                            oxygen saturations were monitored continuously. The                            [id number]-[id number] ([id number]) scope was introduced through the                            mouth, and advanced to the second  part of duodenum.                            The upper GI endoscopy was accomplished without                            difficulty. The patient tolerated the procedure                            well. Scope In: 10:13:56 AM Scope Out: 10:23:59 AM Total Procedure Duration: 0 hours 10 minutes 3 seconds  Findings:      White nummular lesions and plaques were noted in the entire esophagus.       Biopsies were taken with a cold forceps for evaluation of fungal, CMV,       HSV stain but also for histology.      Localized moderately erythematous mucosa without bleeding was found in       the gastric body. Biopsies were taken with a cold forceps for histology.      The examined duodenum was normal. Impression:               - White nummular lesions in esophageal mucosa.                            Biopsied.                           - Erythematous mucosa in the gastric body. Biopsied.                           - Normal examined duodenum. Moderate Sedation:      Per Anesthesia Care Recommendation:           - Return patient to hospital ward for ongoing care.                           - Resume previous diet.                           - Await pathology results.                           -  Follow up with pulmonology regarding hemoptysis. Procedure Code(s):        --- Professional ---                           [phone], Esophagogastroduodenoscopy, flexible,                            transoral; with biopsy, single or multiple Diagnosis Code(s):        --- Professional ---                           K22.8, Other specified diseases of esophagus                           K31.89, Other diseases of stomach and duodenum                           R93.3, Abnormal findings on diagnostic imaging of                            other parts of digestive tract CPT copyright 2019 [employer]. All rights reserved. The codes documented in this report are preliminary and upon coder review may  be  revised to meet current compliance requirements. [name], MD [name],  02/17/2021 10:29:28 AM This report has been signed electronically. Number of Addenda: 0

## 2021-02-17 NOTE — Care Management Obs Status (Signed)
[place] NOTIFICATION   Patient Details  Name: [name] MRN: [id number] Date of Birth: [birth year]   Medicare Observation Status Notification Given:  Yes    Natasha Bence, LCSW 02/17/2021, 3:49 PM

## 2021-02-17 NOTE — Progress Notes (Signed)
PROGRESS NOTE    Patient: [name]                            PCP: [name], MD                    DOB: 1956/05/04            DOA: 02/16/2021 YNW:[id number]             DOS: 02/17/2021, 10:45 AM   LOS: 0 days   Date of Service: The patient was seen and examined on 02/17/2021  Subjective:   The patient was seen and examined this morning. Hemodynamically stable  N.p.o. for EGD  Addendum: Tolerated procedure well, remained stable  Brief Narrative:    [name]  is a 65 y.o. female, with medical history significant ofovarian cancer on chemo, COPD, hypertension, hyperlipidemia, chronic respiratory failure on 3 L of oxygen continuous at home , patient presents to ED secondary to complaints of shortness of breath and hemoptysis, patient reports symptoms has been started yesterday, ports started coughing up some clear sputum mixed with blood with it, the amount of blood is variable, does report shortness of breath, mainly with exertion,    She denies any chest pain, lightheadedness, dizziness, patient was at her oncologist follow-up 02/14/2021, hemoglobin has been noticed for 5.9, he was transfused 2 unit PRBC, by then she had complaints of nosebleed, reports normal brown color bowel movement.   She reports she is using aspirin daily, reports she is rarely using Alieve, only 1 tablet over last month.  In ED  Patient had CTA chest, which was negative for PE, but significant for findings suspicious of esophageal bleed, Hgb 9.7, potassium 3.5, creatinine 0.47,   Patient was admitted for close observation, gastroenterologist was consulted Status post EGD with finding, biopsies were obtained, no active bleeding sites were identified.   Assessment & Plan:   Active Problems:   Hypertension   Anemia, iron deficiency   Chronic respiratory failure (HCC)   Hyperlipidemia   Peritoneal carcinomatosis (HCC)   Upper GI bleed    Upper GI bleed Presented with questionable hematemesis  versus hemoptysis -CT finding consistent with suspicion of esophageal bleeding -Gastroenterologist was consulted --patient was n.p.o. overnight, -Status post EGD 02/17/2021 --nonspecific finding throughout upper GI, biopsies were taken did not identify any active site of bleeding... GI has signed off -History indicates patient is taking aspirin, had a recent nosebleed,  -We will continue to monitor for any hemoptysis today -Post EGD, will advance patient's diet as tolerated -We will monitor H&H closely -Currently hemoglobin 9.7, 8.7, 8.9 this a.m.  -We will continue Protonix 40 mg IV every 12 hours from today -Hold her aspirin. 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 -He is currently on chemotherapy, reports last session was yesterday  Chronic anemia of chronic disease- -Under chemotherapy due to high-grade ovarian cancer/peritoneal cancer -On aspirin -Recent history of epistaxis -Recent nosebleeds, ?hemoptysis versus hematemesis -ED today negative -Hemoglobin 5.9 on 02/14/2021 .. s/p 2U PRBC transfusion with oncology >> 8.9 today -Status post recent blood transfusion and oncologist -Monitoring H&H closely   Hypertension -will hold Norvasc due to soft blood pressure  Hyperlipidemia -Continue with statin     --------------------------------------------------------------------------------------------------------------------------------------- Nutritional status:  The patient's BMI is: Body mass index is 22.26 kg/m. I agree with the assessment and plan as outlined   ---------------------------------------------------------------------------------------------------------------------------------------- Cultures; None   Antimicrobials: none   Consultants: GI    ------------------------------------------------------------------------------------------------------------------------------------------------  DVT prophylaxis:  SCD/Compression stockings Code Status:   Code Status: Full Code  Family Communication: Gust with patient-boyfriend present at bedside The above findings and plan of care has been discussed with patient (and family)  in detail,  they expressed understanding and agreement of above. -Advance care planning has been discussed.   Admission status:   Status is: Observation  The patient remains OBS appropriate and will d/c before 2 midnights.  Dispo: The patient is from: Home              Anticipated d/c is to: Home in 1-2 days               Patient currently is not medically stable to d/c.   Difficult to place patient No      Level of care: Med-Surg   Procedures:   S/p 02/17/2021 atient underwent EGD under propofol sedation.  She tolerated the procedure adequately.   Esophagus showed presence of multiple white nummular lesions and plaques which were biopsied.  There was presence of demarcated area of erythema in the stomach without active bleeding.  Biopsies were taken from this area.  Duodenum was normal.  There was no presence of fresh blood or hematin throughout the upper GI tract.  RECOMMENDATIONS: - Return patient to hospital ward for ongoing care.  - Resume previous diet.  - Await pathology results.  - Follow up with pulmonology regarding hemoptysis. - GI service will sign-off, please call us back if you have any more questions.  Antimicrobials:  Anti-infectives (From admission, onward)   None       Medication:  . [MAR Hold] Chlorhexidine Gluconate Cloth  6 each Topical Daily  . [MAR Hold]  mometasone-formoterol  2 puff Inhalation BID   And  . [MAR Hold] umeclidinium bromide  1 puff Inhalation Daily  . [MAR Hold] nortriptyline  25 mg Oral QHS  . [MAR Hold] pantoprazole  40 mg Intravenous Q12H  . [MAR Hold] predniSONE  5 mg Oral Q breakfast  . [MAR Hold] simvastatin  40 mg Oral QHS    [MAR Hold] ipratropium-albuterol, simethicone susp in sterile water 1000 mL irrigation   Objective:   Vitals:   02/16/21 2147 02/17/21 0135 02/17/21 0517 02/17/21 0816  BP: 121/72 122/68 125/60   Pulse: 84 80 80   Resp: 18 18 20    Temp: 98.1 F (36.7 C) 98 F (36.7 C) (!) 97.2 F (36.2 C)   TempSrc:  Oral    SpO2: 100% 99% 100% 96%  Weight: 51.7 kg     Height: 5' (1.524 m)       Intake/Output Summary (Last 24 hours) at 02/17/2021 1045 Last data filed at 02/17/2021 1031 Gross per 24 hour  Intake 650 ml  Output --  Net 650 ml   Filed Weights   02/16/21 2147  Weight: 51.7 kg     Examination:   Physical Exam  Constitution:  Alert, cooperative, no distress,  Appears calm and comfortable  Psychiatric: Normal and stable mood and affect, cognition intact,   HEENT: Normocephalic, PERRL, otherwise with in Normal limits  Chest:Chest symmetric Cardio vascular:  S1/S2, RRR, No murmure, No Rubs or Gallops  pulmonary: Clear to auscultation bilaterally, respirations  unlabored, negative wheezes / crackles Abdomen: Soft, non-tender, non-distended, bowel sounds,no masses, no organomegaly Muscular skeletal: Limited exam - in bed, able to move all 4 extremities, Normal strength,  Neuro: CNII-XII intact. , normal motor and sensation, reflexes intact  Extremities: No pitting edema lower extremities, +2 pulses  Skin: Dry, warm to touch, negative for any Rashes, No open wounds Wounds: per nursing documentation    ------------------------------------------------------------------------------------------------------------------------------------------    LABs:  CBC Latest Ref Rng & Units  02/17/2021 02/16/2021 02/16/2021  WBC 4.0 - 10.5 K/uL 16.2(H) 14.4(H) 7.3  Hemoglobin 12.0 - 15.0 g/dL 8.9(L) 8.7(L) 9.7(L)  Hematocrit 36.0 - 46.0 % 28.3(L) 28.1(L) 30.2(L)  Platelets 150 - 400 K/uL 260 245 272   CMP Latest Ref Rng & Units 02/17/2021 02/16/2021 02/14/2021  Glucose 70 - 99 mg/dL 99 104(H) 91  BUN 8 - 23 mg/dL 15 29(H) 7(L)  Creatinine 0.44 - 1.00 mg/dL 0.36(L) 0.47 0.50  Sodium 135 - 145 mmol/L 140 135 138  Potassium 3.5 - 5.1 mmol/L 4.0 3.5 3.4(L)  Chloride 98 - 111 mmol/L 101 93(L) 97(L)  CO2 22 - 32 mmol/L 30 31 31   Calcium 8.9 - 10.3 mg/dL 8.7(L) 9.1 8.5(L)  Total Protein 6.5 - 8.1 g/dL 6.1(L) 6.6 6.8  Total Bilirubin 0.3 - 1.2 mg/dL 0.6 0.5 0.1(L)  Alkaline Phos 38 - 126 U/L 59 62 69  AST 15 - 41 U/L 12(L) 15 16  ALT 0 - 44 U/L 8 9 8        Micro Results No results found for this or any previous visit (from the past 240 hour(s)).  Radiology Reports DG Chest 2 View  Result Date: 01/31/2021 CLINICAL DATA:  Fever and shortness of breath. EXAM: CHEST - 2 VIEW COMPARISON:  Chest x-ray 12/17/2020 and chest CT 12/18/2020 FINDINGS: The left subclavian power port is stable. The cardiac silhouette, mediastinal and hilar contours are within normal limits and stable. Stable mild tortuosity and calcification of the thoracic aorta. Stable significant underlying emphysematous changes and pulmonary scarring. Progressive left upper lobe lung opacity could suggest persistent or recurrent pneumonia or post pneumonic scarring changes. A small left pleural effusion is noted. Minimal streaky overlying left basilar atelectasis. No worrisome pulmonary lesions. The bony thorax is intact. IMPRESSION: 1. Progressive left upper lobe lung opacity could suggest persistent or recurrent pneumonia or post pneumonic scarring changes. Chest CT may be helpful for further evaluation. 2. Significant underlying chronic lung disease. 3. Small left pleural effusion. Electronically Signed   By: Marijo Sanes M.D.    On: 01/31/2021 14:14   CT Angio Chest PE W and/or Wo Contrast  Result Date: 02/16/2021 CLINICAL DATA:  Hemoptysis.  Ovarian carcinoma EXAM: CT ANGIOGRAPHY CHEST WITH CONTRAST TECHNIQUE: Multidetector CT imaging of the chest was performed using the standard protocol during bolus administration of intravenous contrast. Multiplanar CT image reconstructions and MIPs were obtained to evaluate the vascular anatomy. CONTRAST:  75mL OMNIPAQUE IOHEXOL 350 MG/ML SOLN COMPARISON:  Chest radiograph February 16, 2021; chest CT February 12, 2021 FINDINGS: Cardiovascular: There is no demonstrable pulmonary embolus. There is no thoracic aortic aneurysm or dissection. There are scattered foci of calcification in visualized great vessels. There is aortic atherosclerosis. There are foci of coronary artery calcification. There is no pericardial effusion or pericardial thickening. Mediastinum/Nodes: Thyroid appears normal. There are multiple stable subcentimeter mediastinal lymph nodes. No new adenopathy is evident. In comparison with recent study, there is now diffuse wall thickening throughout the esophagus extending from the upper cervical region to the  gastroesophageal junction. There is loss of fat plane surrounding the esophagus. This rapid change from 4 days prior and history of hemoptysis/hematemesis suggests that there may be diffuse hemorrhage within the esophagus. No pneumomediastinum is evident. Lungs/Pleura: There are underlying emphysematous changes. There are bronchiectatic changes in each lower lobe, stable. There are multiple areas of irregular consolidation in the left upper lobe toward the apex, stable, concerning for potential neoplastic foci. There is atelectatic change in the posterior left base, stable. No new parenchymal lung lesions are evident compared to 4 days prior. Upper Abdomen: Visualized upper abdominal structures appear unremarkable and stable. Musculoskeletal: No blastic or lytic bone lesions. No chest  wall lesions evident. Review of the MIP images confirms the above findings. IMPRESSION: 1. No demonstrable pulmonary embolus. No thoracic aortic aneurysm or dissection. Aortic atherosclerosis as well as foci of great vessel and coronary artery calcification noted. 2. In comparison with 4 days prior, there is now diffuse esophageal wall thickening and relative enlargement of the esophageal contour extending from the upper thoracic esophagus to the gastroesophageal junction. Loss of fat plane throughout the esophageal region. Suspect diffuse esophageal hemorrhage given this appearance in the history. No pneumomediastinum. Etiology for esophageal hemorrhage uncertain. Neoplastic involvement in the esophagus potentially could cause hemorrhage. Hemorrhage secondary to underlying inflammation or potential chemotherapy response possible. This finding may warrant direct visualization of the esophagus. 3. Underlying emphysema. Areas of bronchiectatic change, stable. Multiple areas of opacity in the left upper lobe raise concern for underlying neoplasia. Appearance stable compared to 4 days prior. Atelectatic change noted in the left lung base. No new opacity evident. 4. Multiple small lymph nodes. No frank adenopathy evident by size criteria. A stable lymph node in the left supraclavicular region, upper limits of normal in size potentially could have neoplastic etiology as noted in recent report. Aortic Atherosclerosis (ICD10-I70.0) and Emphysema (ICD10-J43.9). Electronically Signed   By: Lowella Grip III M.D.   On: 02/16/2021 15:51   CT CHEST ABDOMEN PELVIS W CONTRAST  Result Date: 02/13/2021 CLINICAL DATA:  Ovarian cancer, assess treatment response in the setting of peritoneal disease in this 65 year old. EXAM: CT CHEST, ABDOMEN, AND PELVIS WITH CONTRAST TECHNIQUE: Multidetector CT imaging of the chest, abdomen and pelvis was performed following the standard protocol during bolus administration of intravenous  contrast. CONTRAST:  134mL OMNIPAQUE IOHEXOL 300 MG/ML  SOLN COMPARISON:  November 14, 2020.  In December 18, 2020, chest CT. FINDINGS: CT CHEST FINDINGS Cardiovascular: LEFT-sided Port-A-Cath terminates at the caval to atrial junction. Heart size normal without pericardial effusion. Central pulmonary vasculature unremarkable on venous phase assessment. Aorta with calcified and noncalcified plaque. Mediastinum/Nodes: Small thoracic inlet lymph nodes with mild enlargement, LEFT supraclavicular node on image 7 of series 2 approximately 1 cm, perhaps slightly enlarged compared to the previous exam where it measured approximately 7 8 mm short axis. Other small nodes in this location less than a cm are unchanged. No axillary lymphadenopathy. No mediastinal lymphadenopathy. No hilar lymphadenopathy. Small pre-vascular lymph nodes are similarly stable. RIGHT paratracheal lymph node measuring 9 mm short axis previously approximately 11 mm. Esophagus mildly patulous. Lungs/Pleura: Marked pulmonary emphysema worse at the lung apices. Bronchial wall thickening the LEFT lung base, mild. Nodular masslike areas in the LEFT chest in the LEFT upper lobe, worsening since prior imaging 2.6 x 2.0 cm area on image 23 of series 3 in the LEFT upper lobe contiguous with pre-vascular opacity that was present on the prior study and much worse. Area of greatest axial dimension  on image 30 of series 3 is contiguous with the upper lobe masslike area described above and measures 5.3 x 1.8 cm. Anterior LEFT upper lobe with signs of nodularity as well (image 30, series 3) 1 cm nodule in this location is unchanged. Adjacent peripheral nodular areas are similar, some slightly decreased in others increased, for instance in area in the LEFT upper lobe measuring 1.7 cm long axis on image 19 of series 3 previously less than a cm. Tethering of the major fissure is a new phenomenon based on comparison with prior imaging retracted anteriorly. Nodule  adjacent to the aortic arch in the medial LEFT upper lobe on image 38 of series 3 measures 1.2 cm previously approximately 7 mm when measured in a similar fashion Nodules have developed in the RIGHT lower lobe over time. There is however resolution of RIGHT-sided effusion near complete resolution of LEFT-sided pleural fluid since the prior study. Example of small RIGHT lower lobe nodule on image 104 of series 3 measuring 4 mm. Normal development also of RIGHT middle lobe nodule since the previous exam scattered throughout the RIGHT middle lobe some with tree-in-bud configuration Musculoskeletal: Spinal degenerative changes. No acute or destructive bone process. CT ABDOMEN PELVIS FINDINGS Hepatobiliary: No focal, suspicious hepatic lesion. No pericholecystic stranding. No biliary duct dilation. Pancreas: Normal, without mass, inflammation or ductal dilatation. Spleen: Spleen normal size and contour. Adrenals/Urinary Tract: Adrenal glands are normal. Symmetric renal enhancement. No hydronephrosis. Collapse of the urinary bladder limits assessment. No suspicious renal lesion. Stomach/Bowel: Stomach moderately distended. No perigastric stranding. No small bowel dilation. Appendix is normal. Stool throughout much of the colon. No signs of acute colonic process. Descending and sigmoid diverticulosis and diverticular disease. Vascular/Lymphatic: Calcified and noncalcified atheromatous plaque of the abdominal aorta. There is no gastrohepatic or hepatoduodenal ligament lymphadenopathy. No retroperitoneal or mesenteric lymphadenopathy. Small lymph nodes about the celiac are slightly decreased since December of 2021 largest approximately 6 mm short axis previously approximately 9 mm on image 56 of series 2. No pelvic sidewall lymphadenopathy. 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  No adnexal masses. Other: Signs of peritoneal disease slightly diminished in terms of conspicuity and density but measuring similar greatest  thickness at 2.1 cm in the mid abdomen, previously 2.3 cm disc involved the greater omentum. No ascites. Musculoskeletal: Spinal degenerative changes.  Osteopenia. IMPRESSION: 1. Continued slight decrease in peritoneal involvement and abdominal lymph nodes. 2. Nodular masslike areas in the LEFT upper lobe, with marked worsening since imaging from January of 2022 and not present on imaging from December of 2021 findings may represent worsening of infection or interval development of metastatic disease in the chest. Correlate with respiratory symptoms, consider PET scan for further evaluation. Ultimately, pulmonary consultation may be helpful. 3. Borderline enlarged LEFT supraclavicular lymph node, suspicious in light of other findings, could also be assessed with PET imaging. 4. Scattered small nodules in the RIGHT chest as well as described. 5. Aortic atherosclerosis. These results will be called to the ordering clinician or representative by the Radiologist Assistant, and communication documented in the PACS or [employer]. Aortic Atherosclerosis (ICD10-I70.0) and Emphysema (ICD10-J43.9). Electronically Signed   By: Zetta Bills M.D.   On: 02/13/2021 08:18   DG Chest Port 1 View  Result Date: 02/16/2021 CLINICAL DATA:  65 year old female with cough and hemoptysis. EXAM: PORTABLE CHEST - 1 VIEW COMPARISON:  01/30/2021, 02/12/2021 FINDINGS: The mediastinal contours are within normal limits. No cardiomegaly. Similar appearing left greater than right apical scarring. No new focal consolidation.  No evidence of significant pleural effusion or pneumothorax. Left subclavian vein approach Port-A-Cath in place with the catheter tip at the cavoatrial junction. No acute osseous abnormality. IMPRESSION: Similar-appearing left greater than right apical scarring. No new focal consolidative process. Electronically Signed   By: Ruthann Cancer MD   On: 02/16/2021 14:50    SIGNED: Deatra James, MD, FHM. [facility],  Pager (please use [email or web address] to page/text) Please use Epic Secure Chat for non-urgent communication (7AM-7PM)  If 7PM-7AM, please contact night-coverage [email or web address], 02/17/2021, 10:45 AM

## 2021-02-17 NOTE — Anesthesia Postprocedure Evaluation (Signed)
Anesthesia Post Note  Patient: SYNTHIA FAIRBANK  Procedure(s) Performed: ESOPHAGOGASTRODUODENOSCOPY (EGD) WITH PROPOFOL (N/A )  Patient location during evaluation: PACU Anesthesia Type: General Level of consciousness: awake and alert and oriented Pain management: pain level controlled Vital Signs Assessment: post-procedure vital signs reviewed and stable Respiratory status: spontaneous breathing, respiratory function stable and patient connected to nasal cannula oxygen (on 4 liters of oxygen, spo2 98%) Cardiovascular status: blood pressure returned to baseline and stable Postop Assessment: no apparent nausea or vomiting Anesthetic complications: no   No complications documented.   Last Vitals:  Vitals:   02/17/21 1036 02/17/21 1045  BP: 116/80 107/76  Pulse: 100 98  Resp: 17 18  Temp: 36.8 C   SpO2: 92% 100%    Last Pain:  Vitals:   02/17/21 1036  TempSrc:   PainSc: 0-No pain                 Caylea Foronda C Matteo Banke

## 2021-02-17 NOTE — Brief Op Note (Signed)
02/16/2021 - 02/17/2021  10:30 AM  PATIENT:  [name]  65 y.o. female  PRE-OPERATIVE DIAGNOSIS:  upper gastrointesintal bleeding  POST-OPERATIVE DIAGNOSIS:  whiteish, flakes in esophagus; abnormal, gastric mucosa;hiatal, hernia;  PROCEDURE:  Procedure(s): ESOPHAGOGASTRODUODENOSCOPY (EGD) WITH PROPOFOL (N/A)  SURGEON:  Surgeon(s) and Role:    * Harvel Quale, MD - Primary  Patient underwent EGD under propofol sedation.  She tolerated the procedure adequately.  Esophagus showed presence of multiple white nummular lesions and plaques which were biopsied.  There was presence of demarcated area of erythema in the stomach without active bleeding.  Biopsies were taken from this area.  Duodenum was normal.  There was no presence of fresh blood or hematin throughout the upper GI tract.  RECOMMENDATIONS: - Return patient to hospital ward for ongoing care.  - Resume previous diet.  - Await pathology results.  - Follow up with pulmonology regarding hemoptysis. - GI service will sign-off, please call us back if you have any more questions.  [name], MD Gastroenterology and Hepatology [facility]

## 2021-02-17 NOTE — Progress Notes (Signed)
We will proceed with EGD as scheduled.  I thoroughly discussed with the patient his procedure, including the risks involved. Patient understands what the procedure involves including the benefits and any risks. Patient understands alternatives to the proposed procedure. Risks including (but not limited to) bleeding, tearing of the lining (perforation), rupture of adjacent organs, problems with heart and lung function, infection, and medication reactions. A small percentage of complications may require surgery, hospitalization, repeat endoscopic procedure, and/or transfusion.  Patient understood and agreed.  Marvina Danner Castaneda, MD Gastroenterology and Hepatology [facility]  

## 2021-02-17 NOTE — Transfer of Care (Signed)
Immediate Anesthesia Transfer of Care Note  Patient: [name]  Procedure(s) Performed: ESOPHAGOGASTRODUODENOSCOPY (EGD) WITH PROPOFOL (N/A )  Patient Location: PACU  Anesthesia Type:General  Level of Consciousness: awake, alert  and oriented  Airway & Oxygen Therapy: Patient Spontanous Breathing and Patient connected to nasal cannula oxygen  Post-op Assessment: Report given to RN, Post -op Vital signs reviewed and stable and bilateral wheezing, duoneb nebulizer tretment was ordered  Post vital signs: Reviewed and stable  Last Vitals:  Vitals Value Taken Time  BP 116/80 02/17/21 1039  Temp 98.2   Pulse 98 02/17/21 1044  Resp 18 02/17/21 1044  SpO2 100 % 02/17/21 1044  Vitals shown include unvalidated device data.  Last Pain:  Vitals:   02/17/21 0135  TempSrc: Oral  PainSc:          Complications: No complications documented.

## 2021-02-17 NOTE — Anesthesia Preprocedure Evaluation (Addendum)
Anesthesia Evaluation  Patient identified by MRN, date of birth, ID band Patient awake    Reviewed: Allergy & Precautions, NPO status , Patient's Chart, lab work & pertinent test results  History of Anesthesia Complications Negative for: history of anesthetic complications  Airway Mallampati: II  TM Distance: >3 FB Neck ROM: Full    Dental  (+) Edentulous Upper, Edentulous Lower   Pulmonary asthma , pneumonia, resolved, COPD,  oxygen dependent, former smoker,   On 4l of oxygen  Pulmonary exam normal breath sounds clear to auscultation       Cardiovascular Exercise Tolerance: Poor hypertension, Pt. on medications Normal cardiovascular exam Rhythm:Regular Rate:Normal     Neuro/Psych negative neurological ROS  negative psych ROS   GI/Hepatic negative GI ROS, Neg liver ROS,   Endo/Other  negative endocrine ROS  Renal/GU negative Renal ROS     Musculoskeletal   Abdominal   Peds  Hematology  (+) Blood dyscrasia (ovarian cancer), anemia ,   Anesthesia Other Findings   Reproductive/Obstetrics                            Anesthesia Physical Anesthesia Plan  ASA: IV and emergent  Anesthesia Plan: General   Post-op Pain Management:    Induction: Intravenous  PONV Risk Score and Plan: Propofol infusion  Airway Management Planned: Nasal Cannula and Natural Airway  Additional Equipment:   Intra-op Plan:   Post-operative Plan:   Informed Consent: I have reviewed the patients History and Physical, chart, labs and discussed the procedure including the risks, benefits and alternatives for the proposed anesthesia with the patient or authorized representative who has indicated his/her understanding and acceptance.       Plan Discussed with: Surgeon and CRNA  Anesthesia Plan Comments:        Anesthesia Quick Evaluation

## 2021-02-18 ENCOUNTER — Other Ambulatory Visit: Payer: Self-pay | Admitting: Family Medicine

## 2021-02-18 DIAGNOSIS — E785 Hyperlipidemia, unspecified: Secondary | ICD-10-CM | POA: Diagnosis not present

## 2021-02-18 DIAGNOSIS — D508 Other iron deficiency anemias: Secondary | ICD-10-CM | POA: Diagnosis not present

## 2021-02-18 DIAGNOSIS — D500 Iron deficiency anemia secondary to blood loss (chronic): Secondary | ICD-10-CM | POA: Diagnosis not present

## 2021-02-18 DIAGNOSIS — J9611 Chronic respiratory failure with hypoxia: Secondary | ICD-10-CM | POA: Diagnosis not present

## 2021-02-18 DIAGNOSIS — I10 Essential (primary) hypertension: Secondary | ICD-10-CM

## 2021-02-18 LAB — CBC WITH DIFFERENTIAL/PLATELET
Band Neutrophils: 7 %
Basophils Absolute: 0 10*3/uL (ref 0.0–0.1)
Basophils Relative: 0 %
Eosinophils Absolute: 0 10*3/uL (ref 0.0–0.5)
Eosinophils Relative: 0 %
HCT: 26.9 % — ABNORMAL LOW (ref 36.0–46.0)
Hemoglobin: 8.4 g/dL — ABNORMAL LOW (ref 12.0–15.0)
Lymphocytes Relative: 15 %
Lymphs Abs: 2.6 10*3/uL (ref 0.7–4.0)
MCH: 32.2 pg (ref 26.0–34.0)
MCHC: 31.2 g/dL (ref 30.0–36.0)
MCV: 103.1 fL — ABNORMAL HIGH (ref 80.0–100.0)
Metamyelocytes Relative: 1 %
Monocytes Absolute: 0 10*3/uL — ABNORMAL LOW (ref 0.1–1.0)
Monocytes Relative: 0 %
Neutro Abs: 14.4 10*3/uL — ABNORMAL HIGH (ref 1.7–7.7)
Neutrophils Relative %: 77 %
Platelets: 254 10*3/uL (ref 150–400)
RBC: 2.61 MIL/uL — ABNORMAL LOW (ref 3.87–5.11)
RDW: 18.9 % — ABNORMAL HIGH (ref 11.5–15.5)
WBC: 17.1 10*3/uL — ABNORMAL HIGH (ref 4.0–10.5)
nRBC: 0 % (ref 0.0–0.2)

## 2021-02-18 LAB — PROCALCITONIN: Procalcitonin: 0.19 ng/mL

## 2021-02-18 LAB — LACTIC ACID, PLASMA: Lactic Acid, Venous: 0.7 mmol/L (ref 0.5–1.9)

## 2021-02-18 MED ORDER — AZITHROMYCIN 500 MG PO TABS: 500.0000 mg | ORAL_TABLET | Freq: Every day | ORAL | 0 refills | Status: DC

## 2021-02-18 MED ORDER — AZITHROMYCIN 250 MG PO TABS
500.0000 mg | ORAL_TABLET | Freq: Every day | ORAL | Status: DC
  Administered 2021-02-18: 500 mg via ORAL
  Filled 2021-02-18: qty 2

## 2021-02-18 MED ORDER — AZITHROMYCIN 500 MG PO TABS: 500.0000 mg | ORAL_TABLET | Freq: Every day | ORAL | 0 refills | Status: AC

## 2021-02-18 NOTE — Discharge Instructions (Signed)
Hemoptysis Hemoptysis is when you cough up blood. If it is mild, you may cough up bloody spit and mucus from your lungs. If it is very bad, you cough up a lot of blood. If you cough up 1-2 cups (240-480 mL) of blood within 24 hours get help right away.  Common causes of mild (nonmassive) hemoptysis include: ? Infection in your nose, throat, or lungs. ? Nosebleeds. ? Breathing in an unknown object.  Common causes of very bad (massive) hemoptysis include: ? Tuberculosis (TB). ? A tumor in the lungs or other part of your airway. ? A blood clot in the lungs. ? Stomach bleeding or ulcer. ? Taking blood thinners. ? Having problems with blood clotting. Sometimes, the cause is not known. Follow these instructions at home: Medicines  If you were prescribed an antibiotic medicine, take it as told by your doctor. Do not stop taking the antibiotic even if you start to feel better.  Take over-the-counter and prescription medicines only as told by your doctor. General instructions  Do not use any products that contain nicotine or tobacco, such as cigarettes, e-cigarettes, and chewing tobacco. If you need help quitting, ask your doctor.  Return to your normal activities as told by your doctor. Ask your doctor what activities are safe for you.  Ask your doctor if it is okay for you to exercise or go on an airplane.  Keep all follow-up visits as told by your doctor. This is important.   Contact a doctor if:  You have a fever over 100.35F (38C).  You cough up more blood than before.  The blood looks brighter or darker red. Get help right away if:  You cough up fresh blood or blood clots.  You cough up 1-2 cups (240-480 mL) in 24 hours.  You have trouble breathing.  You feel like you are choking.  You have chest pain.  You feel dizzy or light-headed. Summary  Hemoptysis is coughing up blood.  If you cough up 1-2 cups (240-480 mL) of blood in 24 hours get help right away.  Do  not use any products that contain nicotine or tobacco, such as cigarettes, e-cigarettes, and chewing tobacco. If you need help quitting, ask your doctor. This information is not intended to replace advice given to you by your health care provider. Make sure you discuss any questions you have with your health care provider. Document Revised: 11/15/2019 Document Reviewed: 11/15/2019 Elsevier Patient Education  2021 [employer].   Shortness of Breath, Adult Shortness of breath means you have trouble breathing. Shortness of breath could be a sign of a medical problem. Follow these instructions at home:  Watch for any changes in your symptoms.  Do not use any products that contain nicotine or tobacco, such as cigarettes, e-cigarettes, and chewing tobacco.  Do not smoke. Smoking can cause shortness of breath. If you need help to quit smoking, ask your doctor.  Avoid things that can make it harder to breathe, such as: ? Mold. ? Dust. ? Air pollution. ? Chemical smells. ? Things that can cause allergy symptoms (allergens), if you have allergies.  Keep your living space clean. Use products that help remove mold and dust.  Rest as needed. Slowly return to your normal activities.  Take over-the-counter and prescription medicines only as told by your doctor. This includes oxygen therapy and inhaled medicines.  Keep all follow-up visits as told by your doctor. This is important.   Contact a doctor if:  Your condition does  not get better as soon as expected.  You have a hard time doing your normal activities, even after you rest.  You have new symptoms. Get help right away if:  Your shortness of breath gets worse.  You have trouble breathing when you are resting.  You feel light-headed or you pass out (faint).  You have a cough that is not helped by medicines.  You cough up blood.  You have pain with breathing.  You have pain in your chest, arms, shoulders, or belly  (abdomen).  You have a fever.  You cannot walk up stairs.  You cannot exercise the way you normally do. These symptoms may represent a serious problem that is an emergency. Do not wait to see if the symptoms will go away. Get medical help right away. Call your local emergency services (911 in the [place] Do not drive yourself to the hospital. Summary  Shortness of breath is when you have trouble breathing enough air. It can be a sign of a medical problem.  Avoid things that make it hard for you to breathe, such as smoking, pollution, mold, and dust.  Watch for any changes in your symptoms. Contact your doctor if you do not get better or you get worse. This information is not intended to replace advice given to you by your health care provider. Make sure you discuss any questions you have with your health care provider. Document Revised: 04/06/2018 Document Reviewed: 04/06/2018 Elsevier Patient Education  2021 [employer].

## 2021-02-18 NOTE — Discharge Summary (Signed)
Physician Discharge [facility]    Patient: [name]                   Admit date: 02/16/2021   DOB: 22-Dec-1955             Discharge date:02/18/2021/6:42 AM HYI:[id number]                          PCP: [name], MD  Disposition: HOME   Recommendations for Outpatient Follow-up:   . Follow up: in 1 day with your pulmonologist tomorrow, PCP 1 to 2 weeks, oncologist 1 to 2 weeks.  Discharge Condition: Stable   Code Status:   Code Status: Full Code  Diet recommendation: Regular healthy diet   Discharge Diagnoses:    Active Problems:   Hypertension   Anemia, iron deficiency   Chronic respiratory failure (HCC)   Hyperlipidemia   Peritoneal carcinomatosis (HCC)   Upper GI bleed   History of Present Illness/ Hospital Course [name]:   [name] a65 y.o.female, with medical history significant ofovarian cancer on chemo, COPD,hypertension, hyperlipidemia,chronic respiratory failure on 3 L of oxygen continuous at home,patient presents to ED secondary to complaints of shortness of breath and hemoptysis, patient reports symptoms has been started yesterday, ports started coughing up some clear sputum mixed with blood with it, the amount of blood is variable, does report shortness of breath, mainly with exertion,    She denies any chest pain, lightheadedness, dizziness, patient was at her oncologist follow-up 02/14/2021, hemoglobin has been noticed for 5.9, he was transfused 2 unit PRBC, by then she had complaints of nosebleed, reports normal brown color bowel movement. She reports she is using aspirin daily, reports she is rarely using Alieve, only 1 tablet over last month.  In ED Patient had CTA chest, which was negative for PE, but significant for findings suspicious of esophageal bleed, Hgb 9.7, potassium 3.5, creatinine 0.47,   Patient was admitted for close observation, gastroenterologist was consulted Status post EGD with  finding, biopsies were obtained, no active bleeding sites were identified.   Upper GI bleed Presented with questionable hematemesis versus hemoptysis -Resolved, H&H remained stable  -CT finding consistent with suspicion of esophageal bleeding -Gastroenterologist was consulted --patient was n.p.o. overnight, -Status post EGD 02/17/2021 --nonspecific finding throughout upper GI, biopsies were taken did not identify any active site of bleeding... GI has signed off -History indicates patient is taking aspirin, had a recent nosebleed, -Questionable hemoptysis patient is to follow-up with her pulmonologist tomorrow 02/19/2021   -Post EGD, tolerated diet was advanced accordingly -Currently hemoglobin 9.7, 8.7, 8.9   -Was on Protonix 40 mg p.o. twice daily, will switch to p.o. 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 s/p 2U PRBC transfusion with oncology >> 8.9 now  -Status post recent blood transfusion and oncologist -Monitoring H&H closely   Hypertension -Improved we will resume Norvasc due to soft blood pressure  Hyperlipidemia -Continue with statin   Code Status:  Code Status: Full Code  Family Communication:  with patient-boyfriend present at bedside   Admission status:  Dispo: The patient is from: Home  Anticipated d/c is to: Home       Discharge Instructions:   Discharge  Instructions    Activity as tolerated - No restrictions   Complete by: As directed    Diet - low sodium heart healthy   Complete by: As directed    Discharge instructions   Complete by: As directed    Hold your aspirin for 2 more days Follow-up with your pulmonologist tomorrow   Increase activity slowly   Complete by: As directed        Medication List    TAKE these medications   acetylcysteine 20 % nebulizer solution Commonly known as: MUCOMYST Inhale into the lungs.   albuterol 108 (90 Base) MCG/ACT inhaler Commonly known as: VENTOLIN HFA Inhale 4-6 puffs into the lungs every 6 (six) hours as needed for wheezing or shortness of breath.   amLODipine 5 MG tablet Commonly known as: NORVASC Take 5 mg by mouth at bedtime.   aspirin EC 81 MG tablet Take 81 mg by mouth daily.   CARBOPLATIN IV Inject into the vein every 21 ( twenty-one) days.   ferrous sulfate 325 (65 FE) MG tablet Take 325 mg by mouth every other day.   gabapentin 600 MG tablet Commonly known as: NEURONTIN Take 600 mg by mouth 2 (two) times daily.   ipratropium 17 MCG/ACT inhaler Commonly known as: ATROVENT HFA Inhale 2 puffs into the lungs every 6 (six) hours as needed for wheezing.   ipratropium-albuterol 0.5-2.5 (3) MG/3ML Soln Commonly known as: DUONEB Take 0.5 mg by nebulization every 6 (six) hours as needed.   levalbuterol 1.25 MG/3ML nebulizer solution Commonly known as: XOPENEX Take 1.25 mg by nebulization every 4 (four) hours as needed for shortness of breath or wheezing.   magnesium oxide 400 (241.3 Mg) MG tablet Commonly known as: MAG-OX Take 1 tablet (400 mg total) by mouth 2 (two) times daily.   nortriptyline 25 MG capsule Commonly known as: PAMELOR Take 25 mg by mouth at bedtime.   ondansetron 4 MG disintegrating tablet Commonly known as: Zofran ODT 4mg  ODT q4 hours prn nausea/vomit What changed:   how much to take  how to take this  when to take this  reasons to take  this  additional instructions   OXYGEN Inhale 3 L into the lungs continuous.   PACLITAXEL IV Inject into the vein every 21 ( twenty-one) days.   pantoprazole 40 MG tablet Commonly known as: PROTONIX Take 1 tablet (40 mg total) by mouth daily.   predniSONE 5 MG tablet Commonly known as: DELTASONE Take 5 mg by mouth daily with breakfast. What changed: Another medication with the same name was removed. Continue taking this medication, and follow the directions you see here.   prochlorperazine 10 MG tablet Commonly known as: COMPAZINE Take 1 tablet (10 mg total) by mouth every 6 (six) hours as needed (Nausea or vomiting).   simvastatin 40 MG tablet Commonly known as: ZOCOR Take 40 mg by mouth at bedtime.   Trelegy Ellipta 100-62.5-25 MCG/INH Aepb Generic drug: Fluticasone-Umeclidin-Vilant Inhale 1 puff into the lungs 2 (two) times daily.       No Known Allergies   Procedures /Studies:   DG Chest 2 View  Result Date: 01/31/2021 CLINICAL DATA:  Fever and shortness of breath. EXAM: CHEST - 2 VIEW COMPARISON:  Chest x-ray 12/17/2020 and chest  CT 12/18/2020 FINDINGS: The left subclavian power port is stable. The cardiac silhouette, mediastinal and hilar contours are within normal limits and stable. Stable mild tortuosity and calcification of the thoracic aorta. Stable significant underlying emphysematous changes and pulmonary scarring. Progressive left upper lobe lung opacity could suggest persistent or recurrent pneumonia or post pneumonic scarring changes. A small left pleural effusion is noted. Minimal streaky overlying left basilar atelectasis. No worrisome pulmonary lesions. The bony thorax is intact. IMPRESSION: 1. Progressive left upper lobe lung opacity could suggest persistent or recurrent pneumonia or post pneumonic scarring changes. Chest CT may be helpful for further evaluation. 2. Significant underlying chronic lung disease. 3. Small left pleural effusion. Electronically  Signed   By: Marijo Sanes M.D.   On: 01/31/2021 14:14   CT Angio Chest PE W and/or Wo Contrast  Result Date: 02/16/2021 CLINICAL DATA:  Hemoptysis.  Ovarian carcinoma EXAM: CT ANGIOGRAPHY CHEST WITH CONTRAST TECHNIQUE: Multidetector CT imaging of the chest was performed using the standard protocol during bolus administration of intravenous contrast. Multiplanar CT image reconstructions and MIPs were obtained to evaluate the vascular anatomy. CONTRAST:  69mL OMNIPAQUE IOHEXOL 350 MG/ML SOLN COMPARISON:  Chest radiograph February 16, 2021; chest CT February 12, 2021 FINDINGS: Cardiovascular: There is no demonstrable pulmonary embolus. There is no thoracic aortic aneurysm or dissection. There are scattered foci of calcification in visualized great vessels. There is aortic atherosclerosis. There are foci of coronary artery calcification. There is no pericardial effusion or pericardial thickening. Mediastinum/Nodes: Thyroid appears normal. There are multiple stable subcentimeter mediastinal lymph nodes. No new adenopathy is evident. In comparison with recent study, there is now diffuse wall thickening throughout the esophagus extending from the upper cervical region to the gastroesophageal junction. There is loss of fat plane surrounding the esophagus. This rapid change from 4 days prior and history of hemoptysis/hematemesis suggests that there may be diffuse hemorrhage within the esophagus. No pneumomediastinum is evident. Lungs/Pleura: There are underlying emphysematous changes. There are bronchiectatic changes in each lower lobe, stable. There are multiple areas of irregular consolidation in the left upper lobe toward the apex, stable, concerning for potential neoplastic foci. There is atelectatic change in the posterior left base, stable. No new parenchymal lung lesions are evident compared to 4 days prior. Upper Abdomen: Visualized upper abdominal structures appear unremarkable and stable. Musculoskeletal: No blastic  or lytic bone lesions. No chest wall lesions evident. Review of the MIP images confirms the above findings. IMPRESSION: 1. No demonstrable pulmonary embolus. No thoracic aortic aneurysm or dissection. Aortic atherosclerosis as well as foci of great vessel and coronary artery calcification noted. 2. In comparison with 4 days prior, there is now diffuse esophageal wall thickening and relative enlargement of the esophageal contour extending from the upper thoracic esophagus to the gastroesophageal junction. Loss of fat plane throughout the esophageal region. Suspect diffuse esophageal hemorrhage given this appearance in the history. No pneumomediastinum. Etiology for esophageal hemorrhage uncertain. Neoplastic involvement in the esophagus potentially could cause hemorrhage. Hemorrhage secondary to underlying inflammation or potential chemotherapy response possible. This finding may warrant direct visualization of the esophagus. 3. Underlying emphysema. Areas of bronchiectatic change, stable. Multiple areas of opacity in the left upper lobe raise concern for underlying neoplasia. Appearance stable compared to 4 days prior. Atelectatic change noted in the left lung base. No new opacity evident. 4. Multiple small lymph nodes. No frank adenopathy evident by size criteria. A stable lymph node in the left supraclavicular region, upper limits of normal in size potentially  could have neoplastic etiology as noted in recent report. Aortic Atherosclerosis (ICD10-I70.0) and Emphysema (ICD10-J43.9). Electronically Signed   By: Lowella Grip III M.D.   On: 02/16/2021 15:51   CT CHEST ABDOMEN PELVIS W CONTRAST  Result Date: 02/13/2021 CLINICAL DATA:  Ovarian cancer, assess treatment response in the setting of peritoneal disease in this 65 year old. EXAM: CT CHEST, ABDOMEN, AND PELVIS WITH CONTRAST TECHNIQUE: Multidetector CT imaging of the chest, abdomen and pelvis was performed following the standard protocol during bolus  administration of intravenous contrast. CONTRAST:  163mL OMNIPAQUE IOHEXOL 300 MG/ML  SOLN COMPARISON:  November 14, 2020.  In December 18, 2020, chest CT. FINDINGS: CT CHEST FINDINGS Cardiovascular: LEFT-sided Port-A-Cath terminates at the caval to atrial junction. Heart size normal without pericardial effusion. Central pulmonary vasculature unremarkable on venous phase assessment. Aorta with calcified and noncalcified plaque. Mediastinum/Nodes: Small thoracic inlet lymph nodes with mild enlargement, LEFT supraclavicular node on image 7 of series 2 approximately 1 cm, perhaps slightly enlarged compared to the previous exam where it measured approximately 7 8 mm short axis. Other small nodes in this location less than a cm are unchanged. No axillary lymphadenopathy. No mediastinal lymphadenopathy. No hilar lymphadenopathy. Small pre-vascular lymph nodes are similarly stable. RIGHT paratracheal lymph node measuring 9 mm short axis previously approximately 11 mm. Esophagus mildly patulous. Lungs/Pleura: Marked pulmonary emphysema worse at the lung apices. Bronchial wall thickening the LEFT lung base, mild. Nodular masslike areas in the LEFT chest in the LEFT upper lobe, worsening since prior imaging 2.6 x 2.0 cm area on image 23 of series 3 in the LEFT upper lobe contiguous with pre-vascular opacity that was present on the prior study and much worse. Area of greatest axial dimension on image 30 of series 3 is contiguous with the upper lobe masslike area described above and measures 5.3 x 1.8 cm. Anterior LEFT upper lobe with signs of nodularity as well (image 30, series 3) 1 cm nodule in this location is unchanged. Adjacent peripheral nodular areas are similar, some slightly decreased in others increased, for instance in area in the LEFT upper lobe measuring 1.7 cm long axis on image 19 of series 3 previously less than a cm. Tethering of the major fissure is a new phenomenon based on comparison with prior imaging  retracted anteriorly. Nodule adjacent to the aortic arch in the medial LEFT upper lobe on image 38 of series 3 measures 1.2 cm previously approximately 7 mm when measured in a similar fashion Nodules have developed in the RIGHT lower lobe over time. There is however resolution of RIGHT-sided effusion near complete resolution of LEFT-sided pleural fluid since the prior study. Example of small RIGHT lower lobe nodule on image 104 of series 3 measuring 4 mm. Normal development also of RIGHT middle lobe nodule since the previous exam scattered throughout the RIGHT middle lobe some with tree-in-bud configuration Musculoskeletal: Spinal degenerative changes. No acute or destructive bone process. CT ABDOMEN PELVIS FINDINGS Hepatobiliary: No focal, suspicious hepatic lesion. No pericholecystic stranding. No biliary duct dilation. Pancreas: Normal, without mass, inflammation or ductal dilatation. Spleen: Spleen normal size and contour. Adrenals/Urinary Tract: Adrenal glands are normal. Symmetric renal enhancement. No hydronephrosis. Collapse of the urinary bladder limits assessment. No suspicious renal lesion. Stomach/Bowel: Stomach moderately distended. No perigastric stranding. No small bowel dilation. Appendix is normal. Stool throughout much of the colon. No signs of acute colonic process. Descending and sigmoid diverticulosis and diverticular disease. Vascular/Lymphatic: Calcified and noncalcified atheromatous plaque of the abdominal aorta. There is no  gastrohepatic or hepatoduodenal ligament lymphadenopathy. No retroperitoneal or mesenteric lymphadenopathy. Small lymph nodes about the celiac are slightly decreased since December of 2021 largest approximately 6 mm short axis previously approximately 9 mm on image 56 of series 2. No pelvic sidewall lymphadenopathy. 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  No adnexal masses. Other: Signs of peritoneal disease slightly diminished in terms of conspicuity and density but  measuring similar greatest thickness at 2.1 cm in the mid abdomen, previously 2.3 cm disc involved the greater omentum. No ascites. Musculoskeletal: Spinal degenerative changes.  Osteopenia. IMPRESSION: 1. Continued slight decrease in peritoneal involvement and abdominal lymph nodes. 2. Nodular masslike areas in the LEFT upper lobe, with marked worsening since imaging from January of 2022 and not present on imaging from December of 2021 findings may represent worsening of infection or interval development of metastatic disease in the chest. Correlate with respiratory symptoms, consider PET scan for further evaluation. Ultimately, pulmonary consultation may be helpful. 3. Borderline enlarged LEFT supraclavicular lymph node, suspicious in light of other findings, could also be assessed with PET imaging. 4. Scattered small nodules in the RIGHT chest as well as described. 5. Aortic atherosclerosis. These results will be called to the ordering clinician or representative by the Radiologist Assistant, and communication documented in the PACS or [employer]. Aortic Atherosclerosis (ICD10-I70.0) and Emphysema (ICD10-J43.9). Electronically Signed   By: Zetta Bills M.D.   On: 02/13/2021 08:18   DG Chest Port 1 View  Result Date: 02/16/2021 CLINICAL DATA:  65 year old female with cough and hemoptysis. EXAM: PORTABLE CHEST - 1 VIEW COMPARISON:  01/30/2021, 02/12/2021 FINDINGS: The mediastinal contours are within normal limits. No cardiomegaly. Similar appearing left greater than right apical scarring. No new focal consolidation. No evidence of significant pleural effusion or pneumothorax. Left subclavian vein approach Port-A-Cath in place with the catheter tip at the cavoatrial junction. No acute osseous abnormality. IMPRESSION: Similar-appearing left greater than right apical scarring. No new focal consolidative process. Electronically Signed   By: Ruthann Cancer MD   On: 02/16/2021 14:50     Subjective:    Patient was seen and examined 02/18/2021, 6:42 AM Patient stable today. No acute distress.  No issues overnight Stable for discharge.  Discharge Exam:    Vitals:   02/17/21 1045 02/17/21 1100 02/17/21 1345 02/17/21 1925  BP: 107/76 129/61 130/63   Pulse: 98 (!) 102 (!) 103   Resp: 18 15 16    Temp:   98.7 F (37.1 C)   TempSrc:   Oral   SpO2: 100% 96% 99% 93%  Weight:      Height:        General: Pt lying comfortably in bed & appears in no obvious distress. Cardiovascular: S1 & S2 heard, RRR, S1/S2 +. No murmurs, rubs, gallops or clicks. No JVD or pedal edema. Respiratory: Clear to auscultation without wheezing, rhonchi or crackles. No increased work of breathing. Abdominal:  Non-distended, non-tender & soft. No organomegaly or masses appreciated. Normal bowel sounds heard. CNS: Alert and oriented. No focal deficits. Extremities: no edema, no cyanosis      The results of significant diagnostics from this hospitalization (including imaging, microbiology, ancillary and laboratory) are listed below for reference.      Microbiology:   No results found for this or any previous visit (from the past 240 hour(s)).   Labs:   CBC: Recent Labs  Lab 02/14/21 0822 02/16/21 1401 02/16/21 2255 02/17/21 0622  WBC 3.7* 7.3 14.4* 16.2*  NEUTROABS 1.9 5.8  --   --  HGB 5.9* 9.7* 8.7* 8.9*  HCT 18.9* 30.2* 28.1* 28.3*  MCV 108.6* 99.3 101.4* 102.2*  PLT 183 272 245 952   Basic Metabolic Panel: Recent Labs  Lab 02/14/21 0822 02/16/21 1401 02/17/21 0622  NA 138 135 140  K 3.4* 3.5 4.0  CL 97* 93* 101  CO2 31 31 30   GLUCOSE 91 104* 99  BUN 7* 29* 15  CREATININE 0.50 0.47 0.36*  CALCIUM 8.5* 9.1 8.7*  MG 1.5* 1.5*  --    Liver Function Tests: Recent Labs  Lab 02/14/21 0822 02/16/21 1401 02/17/21 0622  AST 16 15 12*  ALT 8 9 8   ALKPHOS 69 62 59  BILITOT 0.1* 0.5 0.6  PROT 6.8 6.6 6.1*  ALBUMIN 3.4* 3.2* 3.1*   BNP (last 3 results) No results for input(s):  BNP in the last 8760 hours. Cardiac Enzymes: No results for input(s): CKTOTAL, CKMB, CKMBINDEX, TROPONINI in the last 168 hours. CBG: No results for input(s): GLUCAP in the last 168 hours. Hgb A1c No results for input(s): HGBA1C in the last 72 hours. Lipid Profile No results for input(s): CHOL, HDL, LDLCALC, TRIG, CHOLHDL, LDLDIRECT in the last 72 hours. Thyroid function studies No results for input(s): TSH, T4TOTAL, T3FREE, THYROIDAB in the last 72 hours.  Invalid input(s): FREET3 Anemia work up No results for input(s): VITAMINB12, FOLATE, FERRITIN, TIBC, IRON, RETICCTPCT in the last 72 hours. Urinalysis    Component Value Date/Time   COLORURINE YELLOW 01/30/2021 1350   APPEARANCEUR HAZY (A) 01/30/2021 1350   APPEARANCEUR Clear 10/08/2013 2007   LABSPEC 1.024 01/30/2021 1350   LABSPEC 1.003 10/08/2013 2007   PHURINE 5.0 01/30/2021 [place] 01/30/2021 1350   GLUCOSEU Negative 10/08/2013 2007   HGBUR NEGATIVE 01/30/2021 1350   BILIRUBINUR NEGATIVE 01/30/2021 1350   BILIRUBINUR Negative 10/08/2013 2007   KETONESUR NEGATIVE 01/30/2021 1350   PROTEINUR 100 (A) 01/30/2021 1350   NITRITE NEGATIVE 01/30/2021 1350   LEUKOCYTESUR TRACE (A) 01/30/2021 1350   LEUKOCYTESUR Negative 10/08/2013 2007         Time coordinating discharge: Over 45 minutes  SIGNED: Deatra James, MD, FACP, FHM. [facility],  Please use [email or web address] to Page If 7PM-7AM, please contact night-coverage [email or web address] [email or web address] 02/18/2021, 6:42 AM

## 2021-02-19 ENCOUNTER — Other Ambulatory Visit (HOSPITAL_COMMUNITY): Payer: Self-pay | Admitting: *Deleted

## 2021-02-19 ENCOUNTER — Telehealth (HOSPITAL_COMMUNITY): Payer: Self-pay | Admitting: *Deleted

## 2021-02-19 NOTE — Telephone Encounter (Signed)
Patient was expecting medication at the pharmacy.  I advised that the only medication that they needed to pick up following last weeks visit was the magnesium.  That has already been picked up.  There is nothing further for them to get.  Understanding verbalized.

## 2021-02-20 ENCOUNTER — Encounter (HOSPITAL_COMMUNITY): Payer: Self-pay | Admitting: Gastroenterology

## 2021-02-21 ENCOUNTER — Other Ambulatory Visit (INDEPENDENT_AMBULATORY_CARE_PROVIDER_SITE_OTHER): Payer: Self-pay | Admitting: Gastroenterology

## 2021-02-21 ENCOUNTER — Other Ambulatory Visit: Payer: Self-pay | Admitting: Pulmonary Disease

## 2021-02-21 DIAGNOSIS — R918 Other nonspecific abnormal finding of lung field: Secondary | ICD-10-CM

## 2021-02-21 DIAGNOSIS — B3781 Candidal esophagitis: Secondary | ICD-10-CM

## 2021-02-21 LAB — SURGICAL PATHOLOGY

## 2021-02-21 MED ORDER — FLUCONAZOLE 200 MG PO TABS: 200.0000 mg | ORAL_TABLET | Freq: Every day | ORAL | 0 refills | Status: AC

## 2021-03-02 ENCOUNTER — Other Ambulatory Visit: Payer: Self-pay

## 2021-03-02 ENCOUNTER — Encounter
Admission: RE | Admit: 2021-03-02 | Discharge: 2021-03-02 | Disposition: A | Discharge status: Home / Self Care | Payer: Medicare HMO | Source: Ambulatory Visit | Attending: Pulmonary Disease | Admitting: Pulmonary Disease

## 2021-03-02 HISTORY — DX: Malignant neoplasm of unspecified ovary: C56.9

## 2021-03-02 HISTORY — DX: Occlusion and stenosis of bilateral carotid arteries: I65.23

## 2021-03-02 HISTORY — DX: Unspecified osteoarthritis, unspecified site: M19.90

## 2021-03-02 HISTORY — DX: Peripheral vascular disease, unspecified: I73.9

## 2021-03-02 HISTORY — DX: Hyperlipidemia, unspecified: E78.5

## 2021-03-02 HISTORY — DX: Dependence on supplemental oxygen: Z99.81

## 2021-03-02 HISTORY — DX: Emphysema, unspecified: J43.9

## 2021-03-02 HISTORY — DX: Malignant neoplasm of peritoneum, unspecified: C48.2

## 2021-03-02 HISTORY — DX: Atherosclerosis of aorta: I70.0

## 2021-03-02 NOTE — Patient Instructions (Addendum)
Your procedure is scheduled on:  Friday, April 22 Report to the Registration Desk on the 1st floor of the [facility]. To find out your arrival time, please call [phone] between 1PM - 3PM on:  Thursday, April 21  REMEMBER: Instructions that are not followed completely may result in serious medical risk, up to and including death; or upon the discretion of your surgeon and anesthesiologist your surgery may need to be rescheduled.  Do not eat food after midnight the night before surgery.  No gum chewing, lozengers or hard candies.  You may however, drink CLEAR liquids up to 2 hours before you are scheduled to arrive for your surgery. Do not drink anything within 2 hours of your scheduled arrival time.  Clear liquids include: - water  - apple juice without pulp - gatorade (not RED, PURPLE, OR BLUE) - black coffee or tea (Do NOT add milk or creamers to the coffee or tea) Do NOT drink anything that is not on this list.  TAKE THESE MEDICATIONS THE MORNING OF SURGERY WITH A SIP OF WATER:  1.  Duoneb nebulizer 2.  Trelegy inhaler 3.  Gabapentin 4.  Pantoprazole (Protonix) - (take one the night before and one on the morning of surgery - helps to prevent nausea after surgery.)  Bring your Albuterol and Atrovent inhalers with you to the hospital and use it on the day of surgery.  One week prior to surgery: starting April 15 Stop aspirin and Anti-inflammatories (NSAIDS) such as Advil, Aleve, Ibuprofen, Motrin, Naproxen, Naprosyn and Aspirin based products such as Excedrin, Goodys Powder, BC Powder. Stop ANY OVER THE COUNTER supplements until after surgery.  No Alcohol for 24 hours before or after surgery.  No Smoking including e-cigarettes for 24 hours prior to surgery.  No chewable tobacco products for at least 6 hours prior to surgery.  No nicotine patches on the day of surgery.  Do not use any "recreational" drugs for at least a week prior to your surgery.  Please be advised  that the combination of cocaine and anesthesia may have negative outcomes, up to and including death. If you test positive for cocaine, your surgery will be cancelled.  On the morning of surgery brush your teeth with toothpaste and water, you may rinse your mouth with mouthwash if you wish. Do not swallow any toothpaste or mouthwash.  Do not wear jewelry, make-up, hairpins, clips or nail polish.  Do not wear lotions, powders, or perfumes.   Contact lenses, hearing aids and dentures may not be worn into surgery.  Do not bring valuables to the hospital. [facility] is not responsible for any missing/lost belongings or valuables.   Notify your doctor if there is any change in your medical condition (cold, fever, infection).  Wear comfortable clothing (specific to your surgery type) to the hospital.  After surgery, you can help prevent lung complications by doing breathing exercises.  Take deep breaths and cough every 1-2 hours. Your doctor may order a device called an Incentive Spirometer to help you take deep breaths.  If you are being discharged the day of surgery, you will not be allowed to drive home. You will need a responsible adult (18 years or older) to drive you home and stay with you that night.   If you are taking public transportation, you will need to have a responsible adult (18 years or older) with you. Please confirm with your physician that it is acceptable to use public transportation.   Please call  the Pre-admissions Testing Dept. at [phone] if you have any questions about these instructions.  Surgery Visitation Policy:  Patients undergoing a surgery or procedure may have one family member or support person with them as long as that person is not COVID-19 positive or experiencing its symptoms.  That person may remain in the waiting area during the procedure.

## 2021-03-06 ENCOUNTER — Encounter
Admission: RE | Admit: 2021-03-06 | Discharge: 2021-03-06 | Disposition: A | Discharge status: Home / Self Care | Payer: Medicare HMO | Source: Ambulatory Visit | Attending: Pulmonary Disease | Admitting: Pulmonary Disease

## 2021-03-06 ENCOUNTER — Ambulatory Visit: Payer: Medicare HMO

## 2021-03-06 ENCOUNTER — Other Ambulatory Visit: Payer: Medicare HMO

## 2021-03-06 ENCOUNTER — Other Ambulatory Visit: Payer: Self-pay

## 2021-03-06 DIAGNOSIS — Z20822 Contact with and (suspected) exposure to covid-19: Secondary | ICD-10-CM | POA: Insufficient documentation

## 2021-03-06 DIAGNOSIS — Z01812 Encounter for preprocedural laboratory examination: Secondary | ICD-10-CM | POA: Diagnosis present

## 2021-03-06 LAB — CBC
HCT: 26.4 % — ABNORMAL LOW (ref 36.0–46.0)
Hemoglobin: 8.4 g/dL — ABNORMAL LOW (ref 12.0–15.0)
MCH: 31.8 pg (ref 26.0–34.0)
MCHC: 31.8 g/dL (ref 30.0–36.0)
MCV: 100 fL (ref 80.0–100.0)
Platelets: 115 10*3/uL — ABNORMAL LOW (ref 150–400)
RBC: 2.64 MIL/uL — ABNORMAL LOW (ref 3.87–5.11)
RDW: 19.8 % — ABNORMAL HIGH (ref 11.5–15.5)
WBC: 5.1 10*3/uL (ref 4.0–10.5)
nRBC: 0 % (ref 0.0–0.2)

## 2021-03-06 LAB — PROTIME-INR
INR: 0.9 (ref 0.8–1.2)
Prothrombin Time: 12.1 seconds (ref 11.4–15.2)

## 2021-03-06 LAB — SARS CORONAVIRUS 2 (TAT 6-24 HRS): SARS Coronavirus 2: NEGATIVE

## 2021-03-06 LAB — APTT: aPTT: 26 seconds (ref 24–36)

## 2021-03-07 ENCOUNTER — Inpatient Hospital Stay (HOSPITAL_COMMUNITY): Payer: Medicare HMO

## 2021-03-07 ENCOUNTER — Inpatient Hospital Stay (HOSPITAL_BASED_OUTPATIENT_CLINIC_OR_DEPARTMENT_OTHER): Payer: Medicare HMO | Admitting: Hematology

## 2021-03-07 VITALS — BP 127/68 | HR 99 | Temp 96.9°F | Resp 18 | Wt 114.2 lb

## 2021-03-07 DIAGNOSIS — Z95828 Presence of other vascular implants and grafts: Secondary | ICD-10-CM

## 2021-03-07 DIAGNOSIS — C569 Malignant neoplasm of unspecified ovary: Secondary | ICD-10-CM

## 2021-03-07 DIAGNOSIS — C786 Secondary malignant neoplasm of retroperitoneum and peritoneum: Secondary | ICD-10-CM

## 2021-03-07 LAB — CBC WITH DIFFERENTIAL/PLATELET
Abs Immature Granulocytes: 0.02 10*3/uL (ref 0.00–0.07)
Basophils Absolute: 0 10*3/uL (ref 0.0–0.1)
Basophils Relative: 1 %
Eosinophils Absolute: 0 10*3/uL (ref 0.0–0.5)
Eosinophils Relative: 1 %
HCT: 27.8 % — ABNORMAL LOW (ref 36.0–46.0)
Hemoglobin: 8.7 g/dL — ABNORMAL LOW (ref 12.0–15.0)
Immature Granulocytes: 0 %
Lymphocytes Relative: 28 %
Lymphs Abs: 1.7 10*3/uL (ref 0.7–4.0)
MCH: 32.3 pg (ref 26.0–34.0)
MCHC: 31.3 g/dL (ref 30.0–36.0)
MCV: 103.3 fL — ABNORMAL HIGH (ref 80.0–100.0)
Monocytes Absolute: 0.6 10*3/uL (ref 0.1–1.0)
Monocytes Relative: 10 %
Neutro Abs: 3.6 10*3/uL (ref 1.7–7.7)
Neutrophils Relative %: 60 %
Platelets: 115 10*3/uL — ABNORMAL LOW (ref 150–400)
RBC: 2.69 MIL/uL — ABNORMAL LOW (ref 3.87–5.11)
RDW: 19.9 % — ABNORMAL HIGH (ref 11.5–15.5)
WBC: 6 10*3/uL (ref 4.0–10.5)
nRBC: 0 % (ref 0.0–0.2)

## 2021-03-07 LAB — COMPREHENSIVE METABOLIC PANEL
ALT: 12 U/L (ref 0–44)
AST: 18 U/L (ref 15–41)
Albumin: 3.8 g/dL (ref 3.5–5.0)
Alkaline Phosphatase: 72 U/L (ref 38–126)
Anion gap: 11 (ref 5–15)
BUN: 16 mg/dL (ref 8–23)
CO2: 29 mmol/L (ref 22–32)
Calcium: 8.7 mg/dL — ABNORMAL LOW (ref 8.9–10.3)
Chloride: 98 mmol/L (ref 98–111)
Creatinine, Ser: 0.51 mg/dL (ref 0.44–1.00)
GFR, Estimated: >60 mL/min (ref >60)
Glucose, Bld: 105 mg/dL — ABNORMAL HIGH (ref 70–99)
Potassium: 3.6 mmol/L (ref 3.5–5.1)
Sodium: 138 mmol/L (ref 135–145)
Total Bilirubin: 0.6 mg/dL (ref 0.3–1.2)
Total Protein: 6.8 g/dL (ref 6.5–8.1)

## 2021-03-07 LAB — MAGNESIUM: Magnesium: 1.7 mg/dL (ref 1.7–2.4)

## 2021-03-07 MED ORDER — SODIUM CHLORIDE 0.9% FLUSH
10.0000 mL | Freq: Once | INTRAVENOUS | Status: AC
Start: 2021-03-07 — End: 2021-03-07
  Administered 2021-03-07: 10 mL via INTRAVENOUS

## 2021-03-07 MED ORDER — HEPARIN SOD (PORK) LOCK FLUSH 100 UNIT/ML IV SOLN
500.0000 [IU] | Freq: Once | INTRAVENOUS | Status: AC
  Administered 2021-03-07: 500 [IU] via INTRAVENOUS

## 2021-03-07 NOTE — Progress Notes (Signed)
Patient was assessed by [name] and labs have been reviewed.  NO treatment today due to procedure on Friday. Primary RN and pharmacy aware.

## 2021-03-07 NOTE — Progress Notes (Signed)
No treatment today per Dr. Lenor Derrick.    Port flushed and deaccessed, No bruising or swelling at site. Bandaid applied and patient discharged in satisfactory condition. VVS stable with no signs or symptoms of distressed noted.

## 2021-03-07 NOTE — Patient Instructions (Signed)
[place] at [facility]  Discharge Instructions:  Port flushed and labs drawn, chemo treatment cancelled today. Return as scheduled. _______________________________________________________________  Thank you for choosing [place] at [facility] to provide your oncology and hematology care.  To afford each patient quality time with our providers, please arrive at least 15 minutes before your scheduled appointment.  You need to re-schedule your appointment if you arrive 10 or more minutes late.  We strive to give you quality time with our providers, and arriving late affects you and other patients whose appointments are after yours.  Also, if you no show three or more times for appointments you may be dismissed from the clinic.  Again, thank you for choosing [place] at [facility] hope is that these requests will allow you access to exceptional care and in a timely manner. _______________________________________________________________  If you have questions after your visit, please contact our office at [phone] between the hours of 8:30 a.m. and 5:00 p.m. Voicemails left after 4:30 p.m. will not be returned until the following business day. _______________________________________________________________  For prescription refill requests, have your pharmacy contact our office. _______________________________________________________________  Recommendations made by the consultant and any test results will be sent to your referring physician. _______________________________________________________________

## 2021-03-07 NOTE — Patient Instructions (Signed)
[place] at [facility] Discharge Instructions  You were seen today by [name]. He went over your recent results. You did not receive your treatment today. Keep your appointment to have your bronchoscopy on April 22nd. [name] will see you back in 2 weeks for labs and follow up.   Thank you for choosing [place] at [facility] to provide your oncology and hematology care.  To afford each patient quality time with our provider, please arrive at least 15 minutes before your scheduled appointment time.   If you have a lab appointment with the [facility] please come in thru the Main Entrance and check in at the main information desk  You need to re-schedule your appointment should you arrive 10 or more minutes late.  We strive to give you quality time with our providers, and arriving late affects you and other patients whose appointments are after yours.  Also, if you no show three or more times for appointments you may be dismissed from the clinic at the providers discretion.     Again, thank you for choosing [facility].  Our hope is that these requests will decrease the amount of time that you wait before being seen by our physicians.       _____________________________________________________________  Should you have questions after your visit to [facility], please contact our office at [phone] between the hours of 8:00 a.m. and 4:30 p.m.  Voicemails left after 4:00 p.m. will not be returned until the following business day.  For prescription refill requests, have your pharmacy contact our office and allow 72 hours.    Cancer Center Support Programs:   > Cancer Support Group  2nd Tuesday of the month 1pm-2pm, Journey Room

## 2021-03-07 NOTE — Progress Notes (Signed)
[address], [place] [id number]   CLINIC:  Medical Oncology/Hematology  PCP:  [name], [place] / [place] [address] [phone]   REASON FOR VISIT:  Follow-up for high-grade serous ovarian carcinoma  PRIOR THERAPY: None  NGS Results: BRCA 1/2 negative  CURRENT THERAPY: Carboplatin, paclitaxel & Aloxi every 3 weeks  BRIEF ONCOLOGIC HISTORY:  Oncology History  Peritoneal carcinomatosis ([place])  06/06/2020 Initial Diagnosis   Peritoneal carcinomatosis ([place])   07/05/2020 -  Chemotherapy    Patient is on Treatment Plan: OVARIAN CARBOPLATIN (AUC 6) / PACLITAXEL (175) Q21D X 6 CYCLES      07/22/2020 Genetic Testing   Negative genetic testing: no pathogenic variants detected in [place] Expanded CancerNext Panel + RNAInsight. The CancerNext-Expanded gene panel offered by [facility] and includes sequencing and rearrangement analysis for the following 77 genes: AIP, ALK, APC*, ATM*, AXIN2, BAP1, BARD1, BLM, BMPR1A, BRCA1*, BRCA2*, BRIP1*, CDC73, CDH1*, CDK4, CDKN1B, CDKN2A, CHEK2*, CTNNA1, DICER1, FANCC, FH, FLCN, GALNT12, KIF1B, LZTR1, MAX, MEN1, MET, MLH1*, MSH2*, MSH3, MSH6*, MUTYH*, NBN, NF1*, NF2, NTHL1, PALB2*, PHOX2B, PMS2*, POT1, PRKAR1A, PTCH1, PTEN*, RAD51C*, RAD51D*, RB1, RECQL, RET, SDHA, SDHAF2, SDHB, SDHC, SDHD, SMAD4, SMARCA4, SMARCB1, SMARCE1, STK11, SUFU, TMEM127, TP53*, TSC1, TSC2, VHL and XRCC2 (sequencing and deletion/duplication); EGFR, EGLN1, HOXB13, KIT, MITF, PDGFRA, POLD1, and POLE (sequencing only); EPCAM and GREM1 (deletion/duplication only). DNA and RNA analyses performed for * genes. The report date is July 22, 2020.      CANCER STAGING: Cancer Staging No matching staging information was found for the patient.  INTERVAL HISTORY:  [name], a 65 y.o. female, returns for routine follow-up and consideration for next cycle of chemotherapy. [name] was last seen on 02/14/2021.  Due for cycle  #12 of carboplatin, paclitaxel and Aloxi today.   Today she is accompanied by her husband. Overall, she tells me she has been feeling pretty well. She reports having a scratchy throat and coughing all of last night which seldom happens; she continues having an occasional mild cough during the day. She denies having any trouble or pain with swallowing.  She is scheduled to have a video bronchoscopy with Dr. Ottie Glazier at [facility] on 04/22.  Due to her upcoming bronchoscopy, she will not receive treatment today and will return in 2 weeks for treatment.   REVIEW OF SYSTEMS:  Review of Systems  Constitutional: Positive for fatigue (75%). Negative for appetite change.  HENT:   Negative for trouble swallowing.   Respiratory: Positive for cough (coughing due to scratchy throat).   Neurological: Positive for numbness (occasional).  All other systems reviewed and are negative.   PAST MEDICAL/SURGICAL HISTORY:  Past Medical History:  Diagnosis Date  . Anemia   . Aortic atherosclerosis ([place])   . Arthritis   . Asthma   . Bilateral carotid artery stenosis   . Cancer [facility])    Gastric Cancer  . COPD (chronic obstructive pulmonary disease) ([place])   . High cholesterol   . Hyperlipidemia   . Hypertension   . Neuropathy   . Osteoarthritis   . Ovarian cancer ([place])   . Oxygen dependent   . Peripheral vascular disease ([place])   . Peritoneal carcinoma ([place])   . Pneumonia 2019  . Port-A-Cath in place 07/03/2020  . Pulmonary emphysema ([place])    Past Surgical History:  Procedure Laterality Date  . ESOPHAGOGASTRODUODENOSCOPY (EGD) WITH PROPOFOL N/A 02/17/2021   Procedure: ESOPHAGOGASTRODUODENOSCOPY (EGD) WITH PROPOFOL;  Surgeon: Montez Morita, Quillian Quince, MD;  Location: [facility];  Service: Gastroenterology;  Laterality: N/A;  . HALLUX VALGUS BASE WEDGE Right 06/09/2015   Procedure: Base wedge osteotomy with modified McBride right foot ;  Surgeon: Sharlotte Alamo, MD;  Location: [facility] ORS;   Service: Podiatry;  Laterality: Right;  . PORTACATH PLACEMENT Left 06/28/2020   Procedure: PORT-A-CATHETER PLACEMENT LEFT CHEST (attached catheter in left subclavian);  Surgeon: Virl Cagey, MD;  Location: [facility] ORS;  Service: General;  Laterality: Left;  . TUBAL LIGATION      SOCIAL HISTORY:  Social History   Socioeconomic History  . Marital status: Divorced    Spouse name: Not on file  . Number of children: 3  . Years of education: Not on file  . Highest education level: Not on file  Occupational History  . Occupation: DISABLED  Tobacco Use  . Smoking status: Former Smoker    Packs/day: 2.00    Years: 30.00    Pack years: 60.00    Types: Cigarettes    Quit date: 11/04/2013    Years since quitting: 7.3  . Smokeless tobacco: Never Used  Vaping Use  . Vaping Use: Never used  Substance and Sexual Activity  . Alcohol use: No  . Drug use: No  . Sexual activity: Not Currently  Other Topics Concern  . Not on file  Social History Narrative  . Not on file   Social Determinants of Health   Financial Resource Strain: Low Risk   . Difficulty of Paying Living Expenses: Not hard at all  Food Insecurity: No Food Insecurity  . Worried About Charity fundraiser in the Last Year: Never true  . Ran Out of Food in the Last Year: Never true  Transportation Needs: No Transportation Needs  . Lack of Transportation (Medical): No  . Lack of Transportation (Non-Medical): No  Physical Activity: Inactive  . Days of Exercise per Week: 0 days  . Minutes of Exercise per Session: 0 min  Stress: No Stress Concern Present  . Feeling of Stress : Not at all  Social Connections: Socially Isolated  . Frequency of Communication with Friends and Family: More than three times a week  . Frequency of Social Gatherings with Friends and Family: Never  . Attends Religious Services: Never  . Active Member of Clubs or Organizations: No  . Attends Archivist Meetings: Never  . Marital Status:  Divorced  Human resources officer Violence: Not At Risk  . Fear of Current or Ex-Partner: No  . Emotionally Abused: No  . Physically Abused: No  . Sexually Abused: No    FAMILY HISTORY:  Family History  Problem Relation Age of Onset  . Alzheimer's disease Mother   . COPD Father   . Emphysema Father   . Hypertension Father   . Healthy Sister   . Healthy Brother   . Alzheimer's disease Maternal Grandmother   . Healthy Sister   . Healthy Sister   . Healthy Sister   . Prostate cancer Other        paternal grandmother's brother; dx in early 58s  . Breast cancer Neg Hx     CURRENT MEDICATIONS:  Current Outpatient Medications  Medication Sig Dispense Refill  . acetylcysteine (MUCOMYST) 20 % nebulizer solution Inhale 4 mLs into the lungs every 6 (six) hours as needed (COPD).    Marland Kitchen albuterol (PROVENTIL HFA;VENTOLIN HFA) 108 (90 BASE) MCG/ACT inhaler Inhale 4-6 puffs into the lungs every 6 (six) hours as needed  for wheezing or shortness of breath.    Marland Kitchen amLODipine (NORVASC) 5 MG tablet Take 5 mg by mouth at bedtime.     Marland Kitchen aspirin EC 81 MG tablet Take 81 mg by mouth daily.    Marland Kitchen CARBOPLATIN IV Inject into the vein every 21 ( twenty-one) days.    . ferrous sulfate 325 (65 FE) MG tablet Take 325 mg by mouth every other day.    . fluconazole (DIFLUCAN) 200 MG tablet Take 1 tablet (200 mg total) by mouth daily for 21 days. Take 2 tablets the first day, then once daily (Patient taking differently: Take 200 mg by mouth daily.) 22 tablet 0  . Fluticasone-Umeclidin-Vilant (TRELEGY ELLIPTA) 100-62.5-25 MCG/INH AEPB Inhale 1 puff into the lungs 2 (two) times daily.    Marland Kitchen gabapentin (NEURONTIN) 600 MG tablet Take 600 mg by mouth 2 (two) times daily.    Marland Kitchen ipratropium (ATROVENT HFA) 17 MCG/ACT inhaler Inhale 2 puffs into the lungs every 6 (six) hours as needed for wheezing.     Marland Kitchen ipratropium-albuterol (DUONEB) 0.5-2.5 (3) MG/3ML SOLN Take 0.5 mg by nebulization every 6 (six) hours as needed (COPD).    Marland Kitchen  levofloxacin (LEVAQUIN) 500 MG tablet Take 500 mg by mouth daily.    . magnesium oxide (MAG-OX) 400 (241.3 Mg) MG tablet Take 1 tablet (400 mg total) by mouth 2 (two) times daily. 60 tablet 3  . nortriptyline (PAMELOR) 25 MG capsule Take 25 mg by mouth at bedtime.    . ondansetron (ZOFRAN ODT) 4 MG disintegrating tablet 18m ODT q4 hours prn nausea/vomit (Patient taking differently: Take 4 mg by mouth every 4 (four) hours as needed for nausea or vomiting.) 12 tablet 0  . OXYGEN Inhale 3 L into the lungs continuous.     .Marland KitchenPACLITAXEL IV Inject into the vein every 21 ( twenty-one) days.    . pantoprazole (PROTONIX) 40 MG tablet Take 1 tablet (40 mg total) by mouth daily. 30 tablet 2  . predniSONE (DELTASONE) 5 MG tablet Take 5 mg by mouth daily with breakfast.    . simvastatin (ZOCOR) 40 MG tablet Take 40 mg by mouth at bedtime.     No current facility-administered medications for this visit.    ALLERGIES:  No Known Allergies  PHYSICAL EXAM:  Performance status (ECOG): 1 - Symptomatic but completely ambulatory  Vitals:   03/07/21 0822  BP: 127/68  Pulse: 99  Resp: 18  Temp: (!) 96.9 F (36.1 C)  SpO2: 100%   Wt Readings from Last 3 Encounters:  03/07/21 114 lb 3.2 oz (51.8 kg)  03/02/21 114 lb (51.7 kg)  02/16/21 114 lb (51.7 kg)   Physical Exam Vitals reviewed.  Constitutional:      Appearance: Normal appearance.     Interventions: Nasal cannula in place.  Cardiovascular:     Rate and Rhythm: Normal rate and regular rhythm.     Pulses: Normal pulses.     Heart sounds: Normal heart sounds.  Pulmonary:     Effort: Pulmonary effort is normal.     Breath sounds: Examination of the right-lower field reveals rhonchi. Examination of the left-lower field reveals rhonchi. Rhonchi present.  Chest:     Comments: Port-a-Cath in L chest Abdominal:     Palpations: Abdomen is soft. There is no mass.     Tenderness: There is no abdominal tenderness.  Neurological:     General: No  focal deficit present.     Mental Status: She is alert and oriented  to person, place, and time.  Psychiatric:        Mood and Affect: Mood normal.        Behavior: Behavior normal.     LABORATORY DATA:  I have reviewed the labs as listed.  CBC Latest Ref Rng & Units 03/07/2021 03/06/2021 02/18/2021  WBC 4.0 - 10.5 K/uL 6.0 5.1 17.1(H)  Hemoglobin 12.0 - 15.0 g/dL 8.7(L) 8.4(L) 8.4(L)  Hematocrit 36.0 - 46.0 % 27.8(L) 26.4(L) 26.9(L)  Platelets 150 - 400 K/uL 115(L) 115(L) 254   CMP Latest Ref Rng & Units 03/07/2021 02/17/2021 02/16/2021  Glucose 70 - 99 mg/dL 105(H) 99 104(H)  BUN 8 - 23 mg/dL 16 15 29(H)  Creatinine 0.44 - 1.00 mg/dL 0.51 0.36(L) 0.47  Sodium 135 - 145 mmol/L 138 140 135  Potassium 3.5 - 5.1 mmol/L 3.6 4.0 3.5  Chloride 98 - 111 mmol/L 98 101 93(L)  CO2 22 - 32 mmol/L '29 30 31  ' Calcium 8.9 - 10.3 mg/dL 8.7(L) 8.7(L) 9.1  Total Protein 6.5 - 8.1 g/dL 6.8 6.1(L) 6.6  Total Bilirubin 0.3 - 1.2 mg/dL 0.6 0.6 0.5  Alkaline Phos 38 - 126 U/L 72 59 62  AST 15 - 41 U/L 18 12(L) 15  ALT 0 - 44 U/L '12 8 9    ' DIAGNOSTIC IMAGING:  I have independently reviewed the scans and discussed with the patient. CT Angio Chest PE W and/or Wo Contrast  Result Date: 02/16/2021 CLINICAL DATA:  Hemoptysis.  Ovarian carcinoma EXAM: CT ANGIOGRAPHY CHEST WITH CONTRAST TECHNIQUE: Multidetector CT imaging of the chest was performed using the standard protocol during bolus administration of intravenous contrast. Multiplanar CT image reconstructions and MIPs were obtained to evaluate the vascular anatomy. CONTRAST:  75m OMNIPAQUE IOHEXOL 350 MG/ML SOLN COMPARISON:  Chest radiograph February 16, 2021; chest CT February 12, 2021 FINDINGS: Cardiovascular: There is no demonstrable pulmonary embolus. There is no thoracic aortic aneurysm or dissection. There are scattered foci of calcification in visualized great vessels. There is aortic atherosclerosis. There are foci of coronary artery calcification. There is no  pericardial effusion or pericardial thickening. Mediastinum/Nodes: Thyroid appears normal. There are multiple stable subcentimeter mediastinal lymph nodes. No new adenopathy is evident. In comparison with recent study, there is now diffuse wall thickening throughout the esophagus extending from the upper cervical region to the gastroesophageal junction. There is loss of fat plane surrounding the esophagus. This rapid change from 4 days prior and history of hemoptysis/hematemesis suggests that there may be diffuse hemorrhage within the esophagus. No pneumomediastinum is evident. Lungs/Pleura: There are underlying emphysematous changes. There are bronchiectatic changes in each lower lobe, stable. There are multiple areas of irregular consolidation in the left upper lobe toward the apex, stable, concerning for potential neoplastic foci. There is atelectatic change in the posterior left base, stable. No new parenchymal lung lesions are evident compared to 4 days prior. Upper Abdomen: Visualized upper abdominal structures appear unremarkable and stable. Musculoskeletal: No blastic or lytic bone lesions. No chest wall lesions evident. Review of the MIP images confirms the above findings. IMPRESSION: 1. No demonstrable pulmonary embolus. No thoracic aortic aneurysm or dissection. Aortic atherosclerosis as well as foci of great vessel and coronary artery calcification noted. 2. In comparison with 4 days prior, there is now diffuse esophageal wall thickening and relative enlargement of the esophageal contour extending from the upper thoracic esophagus to the gastroesophageal junction. Loss of fat plane throughout the esophageal region. Suspect diffuse esophageal hemorrhage given this appearance in the history. No  pneumomediastinum. Etiology for esophageal hemorrhage uncertain. Neoplastic involvement in the esophagus potentially could cause hemorrhage. Hemorrhage secondary to underlying inflammation or potential chemotherapy  response possible. This finding may warrant direct visualization of the esophagus. 3. Underlying emphysema. Areas of bronchiectatic change, stable. Multiple areas of opacity in the left upper lobe raise concern for underlying neoplasia. Appearance stable compared to 4 days prior. Atelectatic change noted in the left lung base. No new opacity evident. 4. Multiple small lymph nodes. No frank adenopathy evident by size criteria. A stable lymph node in the left supraclavicular region, upper limits of normal in size potentially could have neoplastic etiology as noted in recent report. Aortic Atherosclerosis (ICD10-I70.0) and Emphysema (ICD10-J43.9). Electronically Signed   By: Lowella Grip III M.D.   On: 02/16/2021 15:51   CT CHEST ABDOMEN PELVIS W CONTRAST  Result Date: 02/13/2021 CLINICAL DATA:  Ovarian cancer, assess treatment response in the setting of peritoneal disease in this 65 year old. EXAM: CT CHEST, ABDOMEN, AND PELVIS WITH CONTRAST TECHNIQUE: Multidetector CT imaging of the chest, abdomen and pelvis was performed following the standard protocol during bolus administration of intravenous contrast. CONTRAST:  18m OMNIPAQUE IOHEXOL 300 MG/ML  SOLN COMPARISON:  November 14, 2020.  In December 18, 2020, chest CT. FINDINGS: CT CHEST FINDINGS Cardiovascular: LEFT-sided Port-A-Cath terminates at the caval to atrial junction. Heart size normal without pericardial effusion. Central pulmonary vasculature unremarkable on venous phase assessment. Aorta with calcified and noncalcified plaque. Mediastinum/Nodes: Small thoracic inlet lymph nodes with mild enlargement, LEFT supraclavicular node on image 7 of series 2 approximately 1 cm, perhaps slightly enlarged compared to the previous exam where it measured approximately 7 8 mm short axis. Other small nodes in this location less than a cm are unchanged. No axillary lymphadenopathy. No mediastinal lymphadenopathy. No hilar lymphadenopathy. Small pre-vascular lymph  nodes are similarly stable. RIGHT paratracheal lymph node measuring 9 mm short axis previously approximately 11 mm. Esophagus mildly patulous. Lungs/Pleura: Marked pulmonary emphysema worse at the lung apices. Bronchial wall thickening the LEFT lung base, mild. Nodular masslike areas in the LEFT chest in the LEFT upper lobe, worsening since prior imaging 2.6 x 2.0 cm area on image 23 of series 3 in the LEFT upper lobe contiguous with pre-vascular opacity that was present on the prior study and much worse. Area of greatest axial dimension on image 30 of series 3 is contiguous with the upper lobe masslike area described above and measures 5.3 x 1.8 cm. Anterior LEFT upper lobe with signs of nodularity as well (image 30, series 3) 1 cm nodule in this location is unchanged. Adjacent peripheral nodular areas are similar, some slightly decreased in others increased, for instance in area in the LEFT upper lobe measuring 1.7 cm long axis on image 19 of series 3 previously less than a cm. Tethering of the major fissure is a new phenomenon based on comparison with prior imaging retracted anteriorly. Nodule adjacent to the aortic arch in the medial LEFT upper lobe on image 38 of series 3 measures 1.2 cm previously approximately 7 mm when measured in a similar fashion Nodules have developed in the RIGHT lower lobe over time. There is however resolution of RIGHT-sided effusion near complete resolution of LEFT-sided pleural fluid since the prior study. Example of small RIGHT lower lobe nodule on image 104 of series 3 measuring 4 mm. Normal development also of RIGHT middle lobe nodule since the previous exam scattered throughout the RIGHT middle lobe some with tree-in-bud configuration Musculoskeletal: Spinal degenerative changes. No acute  or destructive bone process. CT ABDOMEN PELVIS FINDINGS Hepatobiliary: No focal, suspicious hepatic lesion. No pericholecystic stranding. No biliary duct dilation. Pancreas: Normal, without  mass, inflammation or ductal dilatation. Spleen: Spleen normal size and contour. Adrenals/Urinary Tract: Adrenal glands are normal. Symmetric renal enhancement. No hydronephrosis. Collapse of the urinary bladder limits assessment. No suspicious renal lesion. Stomach/Bowel: Stomach moderately distended. No perigastric stranding. No small bowel dilation. Appendix is normal. Stool throughout much of the colon. No signs of acute colonic process. Descending and sigmoid diverticulosis and diverticular disease. Vascular/Lymphatic: Calcified and noncalcified atheromatous plaque of the abdominal aorta. There is no gastrohepatic or hepatoduodenal ligament lymphadenopathy. No retroperitoneal or mesenteric lymphadenopathy. Small lymph nodes about the celiac are slightly decreased since December of 2021 largest approximately 6 mm short axis previously approximately 9 mm on image 56 of series 2. No pelvic sidewall lymphadenopathy. 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  No adnexal masses. Other: Signs of peritoneal disease slightly diminished in terms of conspicuity and density but measuring similar greatest thickness at 2.1 cm in the mid abdomen, previously 2.3 cm disc involved the greater omentum. No ascites. Musculoskeletal: Spinal degenerative changes.  Osteopenia. IMPRESSION: 1. Continued slight decrease in peritoneal involvement and abdominal lymph nodes. 2. Nodular masslike areas in the LEFT upper lobe, with marked worsening since imaging from January of 2022 and not present on imaging from December of 2021 findings may represent worsening of infection or interval development of metastatic disease in the chest. Correlate with respiratory symptoms, consider PET scan for further evaluation. Ultimately, pulmonary consultation may be helpful. 3. Borderline enlarged LEFT supraclavicular lymph node, suspicious in light of other findings, could also be assessed with PET imaging. 4. Scattered small nodules in the RIGHT chest as  well as described. 5. Aortic atherosclerosis. These results will be called to the ordering clinician or representative by the Radiologist Assistant, and communication documented in the PACS or [employer]. Aortic Atherosclerosis (ICD10-I70.0) and Emphysema (ICD10-J43.9). Electronically Signed   By: Zetta Bills M.D.   On: 02/13/2021 08:18   DG Chest Port 1 View  Result Date: 02/16/2021 CLINICAL DATA:  65 year old female with cough and hemoptysis. EXAM: PORTABLE CHEST - 1 VIEW COMPARISON:  01/30/2021, 02/12/2021 FINDINGS: The mediastinal contours are within normal limits. No cardiomegaly. Similar appearing left greater than right apical scarring. No new focal consolidation. No evidence of significant pleural effusion or pneumothorax. Left subclavian vein approach Port-A-Cath in place with the catheter tip at the cavoatrial junction. No acute osseous abnormality. IMPRESSION: Similar-appearing left greater than right apical scarring. No new focal consolidative process. Electronically Signed   By: Ruthann Cancer MD   On: 02/16/2021 14:50     ASSESSMENT:  1. Peritoneal carcinomatosis: -Presentation to the ER with right lower quadrant abdominal pain on and off for 1 month. -CTAP on 05/29/2020 showed extensive nodularity throughout the omentum and upper peritoneal cavity. Both ovaries are prominent although no well-defined mass is noted.Small volume ascites. -CA-125 is elevated at 2407. CEA was 2.4. -CT of the chest shows 11 mm right paratracheal lymph node and 11 mm short axis precarinal lymph node. Subcarinal lymphadenopathy measuring 14 mm short axis. This is suspicious for metastatic disease. 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 -Cycle 1 of carboplatin and paclitaxel on 07/05/2020. -Evaluated by Dr. Denman George on 07/07/2020. Reevaluation  after 3 cycles. -CT CAP from 09/01/2020 showed mild improvement with reduction in the omental caking. Stable to  minimally reduced adenopathy in the chest and abdomen. New 4 mm right upper lobe lung nodule could be inflammatory. -CT CAP on 11/07/2019 and after 6 cycles showed improved peritoneal disease/omental caking. 9 mm short axis portacaval lymph node and adjacent upper abdominal lymph nodes measuring 12 mm in short axis. Small retroperitoneal lymph nodes up to 5 mm improved. Peritoneal disease/omental caking now measures 2.4 cm in thickness. -CA-125 on 10/18/2020 improved 52. -Plan for surgical resection after 6 cycles. Unfortunately she was not cleared for surgery by anesthesia/pulmonary.  2. COPD: -Quit smoking 6 years ago. Smoked 2 packs/day for 25-30 years. -Has been on3 L/min oxygen via nasal cannulafor the last 5 years.  3. Family history: -Paternal grandmother with colon cancer.   PLAN:  1.High-grade serous ovarian carcinoma: -CT CAP on 02/12/2021 showed peritoneal disease and abdominal lymph nodes have improved.  However new masslike areas in the left upper lobe, new since January 31 CT scan along with borderline enlargement of left supraclavicular lymph node. - I have reached out to Dr. Raul Del we will schedule her for bronchoscopy and biopsy on 03/09/2021 at [facility]. - I have reviewed her labs which showed hemoglobin 8.7 and platelets 115.  LFTs are normal. -I would hold off on treatment today.  I would reevaluate her in 2 weeks for follow-up of bronchoscopy and biopsy results.  2. Family history: -Germline mutation testing was negative.  3. Peripheral neuropathy: -Neuropathy has been stable.  Gabapentin 300 mg 3 times a day will be continued.  4.Severe COPD: -Continue prednisone, Trelegy and Xopenex. - She was evaluated by Dr. Raul Del and is scheduled for bronchoscopy and biopsy on Friday.  5. Hypomagnesemia: -Continue magnesium daily.    Orders placed this encounter:  No orders of the defined types were placed in this encounter.    Derek Jack, MD [place] [phone]   I, Milinda Antis, am acting as a scribe for Dr. Sanda Linger.  I, Derek Jack MD, have reviewed the above documentation for accuracy and completeness, and I agree with the above.

## 2021-03-08 LAB — CA 125: Cancer Antigen (CA) 125: 35.4 U/mL (ref 0.0–38.1)

## 2021-03-09 ENCOUNTER — Ambulatory Visit: Payer: Medicare HMO

## 2021-03-09 ENCOUNTER — Ambulatory Visit (HOSPITAL_COMMUNITY): Payer: Medicare HMO

## 2021-03-09 ENCOUNTER — Encounter: Payer: Self-pay | Admitting: *Deleted

## 2021-03-09 ENCOUNTER — Ambulatory Visit
Admission: RE | Admit: 2021-03-09 | Discharge: 2021-03-09 | Disposition: A | Discharge status: Home / Self Care | Payer: Medicare HMO | Attending: Pulmonary Disease | Admitting: Pulmonary Disease

## 2021-03-09 ENCOUNTER — Ambulatory Visit: Payer: Medicare HMO | Admitting: Registered Nurse

## 2021-03-09 ENCOUNTER — Encounter
Admission: RE | Disposition: A | Discharge status: Home / Self Care | Payer: Self-pay | Source: Home / Self Care | Attending: Pulmonary Disease

## 2021-03-09 DIAGNOSIS — I700 Atherosclerosis of aorta: Secondary | ICD-10-CM | POA: Insufficient documentation

## 2021-03-09 DIAGNOSIS — Z87891 Personal history of nicotine dependence: Secondary | ICD-10-CM | POA: Diagnosis not present

## 2021-03-09 DIAGNOSIS — R042 Hemoptysis: Secondary | ICD-10-CM | POA: Insufficient documentation

## 2021-03-09 DIAGNOSIS — Z9981 Dependence on supplemental oxygen: Secondary | ICD-10-CM | POA: Diagnosis not present

## 2021-03-09 DIAGNOSIS — T17590A Other foreign object in bronchus causing asphyxiation, initial encounter: Secondary | ICD-10-CM | POA: Insufficient documentation

## 2021-03-09 DIAGNOSIS — R0600 Dyspnea, unspecified: Secondary | ICD-10-CM | POA: Insufficient documentation

## 2021-03-09 DIAGNOSIS — Z8543 Personal history of malignant neoplasm of ovary: Secondary | ICD-10-CM | POA: Diagnosis not present

## 2021-03-09 DIAGNOSIS — R911 Solitary pulmonary nodule: Secondary | ICD-10-CM | POA: Diagnosis present

## 2021-03-09 DIAGNOSIS — J439 Emphysema, unspecified: Secondary | ICD-10-CM | POA: Insufficient documentation

## 2021-03-09 DIAGNOSIS — I739 Peripheral vascular disease, unspecified: Secondary | ICD-10-CM | POA: Diagnosis not present

## 2021-03-09 DIAGNOSIS — Z85028 Personal history of other malignant neoplasm of stomach: Secondary | ICD-10-CM | POA: Insufficient documentation

## 2021-03-09 DIAGNOSIS — X58XXXA Exposure to other specified factors, initial encounter: Secondary | ICD-10-CM | POA: Insufficient documentation

## 2021-03-09 DIAGNOSIS — I6523 Occlusion and stenosis of bilateral carotid arteries: Secondary | ICD-10-CM | POA: Insufficient documentation

## 2021-03-09 HISTORY — PX: VIDEO BRONCHOSCOPY WITH ENDOBRONCHIAL NAVIGATION: SHX6175

## 2021-03-09 HISTORY — PX: VIDEO BRONCHOSCOPY WITH ENDOBRONCHIAL ULTRASOUND: SHX6177

## 2021-03-09 SURGERY — VIDEO BRONCHOSCOPY WITH ENDOBRONCHIAL NAVIGATION
Anesthesia: General

## 2021-03-09 MED ORDER — SUGAMMADEX SODIUM 200 MG/2ML IV SOLN
INTRAVENOUS | Status: DC | PRN
  Administered 2021-03-09: 103.6 mg via INTRAVENOUS

## 2021-03-09 MED ORDER — VASOPRESSIN 20 UNIT/ML IV SOLN
INTRAVENOUS | Status: DC | PRN
  Administered 2021-03-09 (×2): 1 [IU] via INTRAVENOUS

## 2021-03-09 MED ORDER — LIDOCAINE HCL (CARDIAC) PF 100 MG/5ML IV SOSY
PREFILLED_SYRINGE | INTRAVENOUS | Status: DC | PRN
  Administered 2021-03-09: 80 mg via INTRAVENOUS

## 2021-03-09 MED ORDER — EPHEDRINE SULFATE 50 MG/ML IJ SOLN
INTRAMUSCULAR | Status: DC | PRN
  Administered 2021-03-09: 5 mg via INTRAVENOUS

## 2021-03-09 MED ORDER — ORAL CARE MOUTH RINSE: 15.0000 mL | Freq: Once | OROMUCOSAL | Status: AC

## 2021-03-09 MED ORDER — ROCURONIUM BROMIDE 100 MG/10ML IV SOLN
INTRAVENOUS | Status: DC | PRN
  Administered 2021-03-09: 40 mg via INTRAVENOUS

## 2021-03-09 MED ORDER — CHLORHEXIDINE GLUCONATE 0.12 % MT SOLN: 15.0000 mL | Freq: Once | OROMUCOSAL | Status: AC

## 2021-03-09 MED ORDER — LIDOCAINE HCL (PF) 1 % IJ SOLN
30.0000 mL | Freq: Once | INTRAMUSCULAR | Status: DC
  Filled 2021-03-09: qty 30

## 2021-03-09 MED ORDER — CHLORHEXIDINE GLUCONATE 0.12 % MT SOLN
OROMUCOSAL | Status: AC
  Administered 2021-03-09: 15 mL via OROMUCOSAL
  Filled 2021-03-09: qty 15

## 2021-03-09 MED ORDER — BUTAMBEN-TETRACAINE-BENZOCAINE 2-2-14 % EX AERO
1.0000 | INHALATION_SPRAY | Freq: Once | CUTANEOUS | Status: DC
  Filled 2021-03-09: qty 20

## 2021-03-09 MED ORDER — MIDAZOLAM HCL 2 MG/2ML IJ SOLN
INTRAMUSCULAR | Status: AC
  Filled 2021-03-09: qty 2

## 2021-03-09 MED ORDER — FENTANYL CITRATE (PF) 100 MCG/2ML IJ SOLN
INTRAMUSCULAR | Status: AC
  Filled 2021-03-09: qty 2

## 2021-03-09 MED ORDER — PHENYLEPHRINE HCL 0.25 % NA SOLN
1.0000 | Freq: Four times a day (QID) | NASAL | Status: DC | PRN
  Filled 2021-03-09: qty 15

## 2021-03-09 MED ORDER — FENTANYL CITRATE (PF) 100 MCG/2ML IJ SOLN
INTRAMUSCULAR | Status: DC | PRN
  Administered 2021-03-09: 50 ug via INTRAVENOUS

## 2021-03-09 MED ORDER — ONDANSETRON HCL 4 MG/2ML IJ SOLN: 4.0000 mg | Freq: Once | INTRAMUSCULAR | Status: DC | PRN

## 2021-03-09 MED ORDER — PROPOFOL 10 MG/ML IV BOLUS
INTRAVENOUS | Status: DC | PRN
  Administered 2021-03-09: 100 mg via INTRAVENOUS

## 2021-03-09 MED ORDER — LIDOCAINE HCL URETHRAL/MUCOSAL 2 % EX GEL
1.0000 "application " | Freq: Once | CUTANEOUS | Status: DC
  Filled 2021-03-09: qty 5

## 2021-03-09 MED ORDER — LACTATED RINGERS IV SOLN: INTRAVENOUS | Status: DC

## 2021-03-09 MED ORDER — LIDOCAINE HCL 4 % MT SOLN
OROMUCOSAL | Status: DC | PRN
  Administered 2021-03-09: 4 mL via TOPICAL

## 2021-03-09 MED ORDER — ONDANSETRON HCL 4 MG/2ML IJ SOLN
INTRAMUSCULAR | Status: DC | PRN
  Administered 2021-03-09: 4 mg via INTRAVENOUS

## 2021-03-09 MED ORDER — FENTANYL CITRATE (PF) 100 MCG/2ML IJ SOLN: 25.0000 ug | INTRAMUSCULAR | Status: DC | PRN

## 2021-03-09 MED ORDER — PHENYLEPHRINE HCL (PRESSORS) 10 MG/ML IV SOLN
INTRAVENOUS | Status: DC | PRN
  Administered 2021-03-09 (×3): 100 ug via INTRAVENOUS

## 2021-03-09 MED ORDER — MIDAZOLAM HCL 2 MG/2ML IJ SOLN
INTRAMUSCULAR | Status: DC | PRN
  Administered 2021-03-09: 1 mg via INTRAVENOUS

## 2021-03-09 NOTE — Transfer of Care (Signed)
Immediate Anesthesia Transfer of Care Note  Patient: [name]  Procedure(s) Performed: VIDEO BRONCHOSCOPY WITH ENDOBRONCHIAL NAVIGATION (N/A ) VIDEO BRONCHOSCOPY WITH ENDOBRONCHIAL ULTRASOUND (N/A )  Patient Location: PACU  Anesthesia Type:General  Level of Consciousness: sedated  Airway & Oxygen Therapy: Patient Spontanous Breathing and Patient connected to nasal cannula oxygen  Post-op Assessment: Report given to RN and Post -op Vital signs reviewed and stable  Post vital signs: Reviewed and stable  Last Vitals:  Vitals Value Taken Time  BP 131/77 03/09/21 1346  Temp    Pulse 77 03/09/21 1346  Resp 19 03/09/21 1346  SpO2 100 % 03/09/21 1346  Vitals shown include unvalidated device data.  Last Pain:  Vitals:   03/09/21 1346  TempSrc:   PainSc: 0-No pain         Complications: No complications documented.

## 2021-03-09 NOTE — Procedures (Signed)
ELECTROMAGNETIC NAVIGATIONAL BRONCHOSCOPY PROCEDURE NOTE  FIBEROPTIC BRONCHOSCOPY WITH BRONCHOALVEOLAR LAVAGE AND TRACHEOBRONCHIAL TRESS ASPIRATION OF MUCUS PLUGGING      Flexible bronchoscopy was performed  by : Lanney Gins MD  assistance by : 1)Repiratory therapist  and 2)[employer] staff and 3) Anesthesia team and 4) Flouroscopy team and 5) [employer] supporting staff   Indication for the procedure was :  Pre-procedural H&P. The following assessment was performed on the day of the procedure prior to initiating sedation History:  Chest pain n Dyspnea y Hemoptysis n Cough y Fever n Other pertinent items n  Examination Vital signs -reviewed as per nursing documentation today Cardiac    Murmurs: n  Rubs : n  Gallop: n Lungs Wheezing: n Rales : n Rhonchi :y  Other pertinent findings: SOB/hypoxemia due to chronic lung disease   Pre-procedural assessment for Procedural Sedation included: Depth of sedation: As per anesthesia team  ASA Classification:  2 Mallampati airway assessment: 3    Medication list reviewed: y  The patient's interval history was taken and revealed: no new complaints The pre- procedure physical examination revealed: No new findings Refer to prior clinic note for details.  Informed Consent: Informed consent was obtained from:  patient after explanation of procedure and risks, benefits, as well as alternative procedures available.  Explanation of level of sedation and possible transfusion was also provided.    Procedural Preparation: Time out was performed and patient was identified by name and birthdate and procedure to be performed and side for sampling, if any, was specified. Pt was intubated by anesthesia.  The patient was appropriately draped.   Fiberoptic bronchoscopy with airway inspection and BAL Procedure findings:    Media Information         Document Information  Photos    03/09/2021 12:54  Attached To:   Hospital Encounter on 03/09/21   Source Information  Vicki Glazier, MD  [facility]    Media Information         Document Information  Photos    03/09/2021 12:54  Attached To:  Hospital Encounter on 03/09/21   Source Information  Vicki Glazier, MD  [facility]    Media Information         Document Information  Photos    03/09/2021 12:52  Attached To:  Hospital Encounter on 03/09/21   Source Information  Vicki Glazier, MD  [facility]    Bronchoscope was inserted via ETT  without difficulty.  Posterior oropharynx, epiglottis, arytenoids, false cords and vocal cords were not visualized as these were bypassed by endotracheal tube. The distal trachea was normal in circumference and appearance without mucosal, cartilaginous or branching abnormalities.  The main carina was mildly splayed . All right and left lobar airways were visualized to the Subsegmental level.  Sub- sub segmental carinae were identified in all the distal airways.   Secretions were visible in the following airways and appeared to be clear.  The mucosa was :  >4 SEGMENTS WITH MUCUS PLUGGING REQUIRED ASPIRATION INCLUDING RUL, RLL, LUL AND LINGULA  Airways were notable for:        exophytic lesions :n       extrinsic compression in the following distributions: n.       Friable mucosa: YES AT LUL       Anthrocotic material /pigmentation: n     Post procedure Diagnosis:   MUCUS PLUGGING     Electromagnetic Navigational Bronchoscopy Procedure Findings:  After appropriate CT-guided planning ENB scope was advanced via endotracheal tube and  LG was advanced for registration.  Post appropriate planning and registration peripheral navigation was used to visualize target lesion.    Target lesion at left upper lobe located, Cytobrush x4 performed with inflammatory exudate macrophage predominant found and heavy mucoid material and debris.  Surgical forceps biopsy x5 performed with similar  exudative inflammatory macrophage rich profile.  Post procedure diagnosis:   Mucous plugging with inflammatory exudate    Cytology brushes : Left upper lobe  Broncho-alveolar lavage site: Left upper lobe sent for microbiology and cytology                             60 ml volume infused 30 ml volume returned with serosanguineous and mucoid appearance  Estimated Blood loss: 5cc.  Complications included: None immediate    Disposition: Home, I spoke to caretaker [name] and we have set up outpatient follow-up with Dr. Genice Rouge MD  [facility]

## 2021-03-09 NOTE — Anesthesia Preprocedure Evaluation (Signed)
Anesthesia Evaluation  Patient identified by MRN, date of birth, ID band Patient awake    Reviewed: Allergy & Precautions, NPO status , Patient's Chart, lab work & pertinent test results  History of Anesthesia Complications Negative for: history of anesthetic complications  Airway Mallampati: III       Dental  (+) Edentulous Upper, Edentulous Lower   Pulmonary neg sleep apnea, COPD,  COPD inhaler and oxygen dependent, Not current smoker, former smoker,           Cardiovascular hypertension, Pt. on medications (-) Past MI and (-) CHF (-) dysrhythmias (-) Valvular Problems/Murmurs     Neuro/Psych neg Seizures    GI/Hepatic Neg liver ROS, GERD  Medicated and Controlled,  Endo/Other  neg diabetes  Renal/GU negative Renal ROS     Musculoskeletal   Abdominal   Peds  Hematology  (+) anemia ,   Anesthesia Other Findings   Reproductive/Obstetrics                             Anesthesia Physical Anesthesia Plan  ASA: III  Anesthesia Plan: General   Post-op Pain Management:    Induction: Intravenous  PONV Risk Score and Plan: 3 and Ondansetron and Dexamethasone  Airway Management Planned: Oral ETT  Additional Equipment:   Intra-op Plan:   Post-operative Plan:   Informed Consent: I have reviewed the patients History and Physical, chart, labs and discussed the procedure including the risks, benefits and alternatives for the proposed anesthesia with the patient or authorized representative who has indicated his/her understanding and acceptance.       Plan Discussed with:   Anesthesia Plan Comments:         Anesthesia Quick Evaluation

## 2021-03-09 NOTE — Anesthesia Postprocedure Evaluation (Signed)
Anesthesia Post Note  Patient: [name]  Procedure(s) Performed: VIDEO BRONCHOSCOPY WITH ENDOBRONCHIAL NAVIGATION (N/A ) VIDEO BRONCHOSCOPY WITH ENDOBRONCHIAL ULTRASOUND (N/A )  Patient location during evaluation: PACU Anesthesia Type: General Level of consciousness: awake and alert and oriented Pain management: pain level controlled Vital Signs Assessment: post-procedure vital signs reviewed and stable Respiratory status: spontaneous breathing, nonlabored ventilation and respiratory function stable Cardiovascular status: blood pressure returned to baseline and stable Postop Assessment: no signs of nausea or vomiting Anesthetic complications: no   No complications documented.   Last Vitals:  Vitals:   03/09/21 1426 03/09/21 1453  BP: (!) 145/63 128/61  Pulse: 78 82  Resp: 18 16  Temp: 36.6 C   SpO2: 97% 96%    Last Pain:  Vitals:   03/09/21 1453  TempSrc:   PainSc: 0-No pain                 Reagen Goates

## 2021-03-09 NOTE — Anesthesia Procedure Notes (Signed)
Procedure Name: Intubation Date/Time: 03/09/2021 12:45 PM Performed by: Hedda Slade, CRNA Pre-anesthesia Checklist: Patient identified, Patient being monitored, Timeout performed, Emergency Drugs available and Suction available Patient Re-evaluated:Patient Re-evaluated prior to induction Oxygen Delivery Method: Circle system utilized Preoxygenation: Pre-oxygenation with 100% oxygen Induction Type: IV induction Ventilation: Mask ventilation without difficulty Laryngoscope Size: 3 and McGraph Grade View: Grade I Tube type: Oral Tube size: 8.5 mm Number of attempts: 1 Airway Equipment and Method: Stylet,  Video-laryngoscopy and LTA kit utilized Placement Confirmation: ETT inserted through vocal cords under direct vision,  positive ETCO2 and breath sounds checked- equal and bilateral Secured at: 21 cm Tube secured with: Tape Dental Injury: Teeth and Oropharynx as per pre-operative assessment

## 2021-03-09 NOTE — OR Nursing (Signed)
Per Dr. Lanney Gins, secure-chat, CXR not needed for postprocedure.  Pt. Instructed she can resume all of her regular medications (as advised by MD).

## 2021-03-09 NOTE — H&P (Signed)
Pulmonary Medicine          Date: 03/09/2021,   MRN# [id number] [name] 1956-03-09     Admission      bronchoscopy with left upper lobe nodular lesion suspicious for bronchogenic carcinoma             Referring physician Dr. Raul Del    CHIEF COMPLAINT:   Left apical irregular spiculated nodular lesion suspicious for bronchogenic carcinoma   HISTORY OF PRESENT ILLNESS   Is a very pleasant 65 year old female with a history of chronic anemia, aortic atherosclerosis, osteoarthritis, asthma, bilateral carotid artery stenosis, previous history of gastric cancer, COPD, dyslipidemia, neuropathy, ovarian cancer, chronic hypoxemia requiring supplemental oxygen, peripheral vascular disease, peritoneal carcinoma in the past, history of pulmonary emphysema and Port-A-Cath placement in the past.  She was referred for bronchoscopic evaluation after being seen by her primary pulmonologist Dr. Raul Del in outpatient setting with complaints of recurrent nonmassive mild hemoptysis and intermittent expectoration of phlegm with worsening dyspnea.  She was noted to have nodular masslike area in the left upper lobe with marked worsening since January and not present in December 2021 chest imaging.  Patient presents for additional evaluation of left upper lobe lesion via bronchoscopic biopsy.  We have reviewed imaging together and it answered questions patient wishes to proceed as planned.  She has not taken aspirin in over 7 days, denies taking any other antiplatelet agents and denies taking blood thinners.   PAST MEDICAL HISTORY   Past Medical History:  Diagnosis Date  . Anemia   . Aortic atherosclerosis ([place])   . Arthritis   . Asthma   . Bilateral carotid artery stenosis   . Cancer [facility])    Gastric Cancer  . COPD (chronic obstructive pulmonary disease) ([place])   . High cholesterol   . Hyperlipidemia   . Hypertension   . Neuropathy   . Osteoarthritis   . Ovarian cancer ([place])   .  Oxygen dependent   . Peripheral vascular disease ([place])   . Peritoneal carcinoma ([place])   . Pneumonia 2019  . Port-A-Cath in place 07/03/2020  . Pulmonary emphysema ([place])      SURGICAL HISTORY   Past Surgical History:  Procedure Laterality Date  . ESOPHAGOGASTRODUODENOSCOPY (EGD) WITH PROPOFOL N/A 02/17/2021   Procedure: ESOPHAGOGASTRODUODENOSCOPY (EGD) WITH PROPOFOL;  Surgeon: Harvel Quale, MD;  Location: [facility];  Service: Gastroenterology;  Laterality: N/A;  . HALLUX VALGUS BASE WEDGE Right 06/09/2015   Procedure: Base wedge osteotomy with modified McBride right foot ;  Surgeon: Sharlotte Alamo, MD;  Location: [facility] ORS;  Service: Podiatry;  Laterality: Right;  . PORTACATH PLACEMENT Left 06/28/2020   Procedure: PORT-A-CATHETER PLACEMENT LEFT CHEST (attached catheter in left subclavian);  Surgeon: Virl Cagey, MD;  Location: [facility] ORS;  Service: General;  Laterality: Left;  . TUBAL LIGATION       FAMILY HISTORY   Family History  Problem Relation Age of Onset  . Alzheimer's disease Mother   . COPD Father   . Emphysema Father   . Hypertension Father   . Healthy Sister   . Healthy Brother   . Alzheimer's disease Maternal Grandmother   . Healthy Sister   . Healthy Sister   . Healthy Sister   . Prostate cancer Other        paternal grandmother's brother; dx in early 35s  . Breast cancer Neg Hx      SOCIAL HISTORY   Social History   Tobacco Use  .  Smoking status: Former Smoker    Packs/day: 2.00    Years: 30.00    Pack years: 60.00    Types: Cigarettes    Quit date: 11/04/2013    Years since quitting: 7.3  . Smokeless tobacco: Never Used  Vaping Use  . Vaping Use: Never used  Substance Use Topics  . Alcohol use: No  . Drug use: No     MEDICATIONS    Home Medication:    Current Medication:  Current Facility-Administered Medications:  .  butamben-tetracaine-benzocaine (CETACAINE) spray 1 spray, 1 spray, Topical, Once, Ottie Glazier, MD .   lactated ringers infusion, , Intravenous, Continuous, Gunnar Fusi, MD, Last Rate: 10 mL/hr at 03/09/21 1129, New Bag at 03/09/21 1129 .  lidocaine (PF) (XYLOCAINE) 1 % injection 30 mL, 30 mL, Infiltration, Once, Liley Rake, MD .  lidocaine (XYLOCAINE) 2 % jelly 1 application, 1 application, Topical, Once, Daquane Aguilar, MD .  phenylephrine (NEO-SYNEPHRINE) 0.25 % nasal spray 1 spray, 1 spray, Each Nare, Q6H PRN, Ottie Glazier, MD    ALLERGIES   Patient has no known allergies.     REVIEW OF SYSTEMS    Review of Systems:  Gen:  Denies  fever, sweats, chills weigh loss  HEENT: Denies blurred vision, double vision, ear pain, eye pain, hearing loss, nose bleeds, sore throat Cardiac:  No dizziness, chest pain or heaviness, chest tightness,edema Resp:   Denies cough or sputum porduction, shortness of breath,wheezing, hemoptysis,  Gi: Denies swallowing difficulty, stomach pain, nausea or vomiting, diarrhea, constipation, bowel incontinence Gu:  Denies bladder incontinence, burning urine Ext:   Denies Joint pain, stiffness or swelling Skin: Denies  skin rash, easy bruising or bleeding or hives Endoc:  Denies polyuria, polydipsia , polyphagia or weight change Psych:   Denies depression, insomnia or hallucinations   Other:  All other systems negative   VS: BP (!) 186/88   Pulse 100   Temp 98.1 F (36.7 C) (Tympanic)   Resp 18   SpO2 95%      PHYSICAL EXAM    GENERAL:NAD, no fevers, chills, no weakness no fatigue HEAD: Normocephalic, atraumatic.  EYES: Pupils equal, round, reactive to light. Extraocular muscles intact. No scleral icterus.  MOUTH: Moist mucosal membrane. Dentition intact. No abscess noted.  EAR, NOSE, THROAT: Clear without exudates. No external lesions.  NECK: Supple. No thyromegaly. No nodules. No JVD.  PULMONARY: Mild bilateral rhonchorous breath sounds CARDIOVASCULAR: S1 and S2. Regular rate and rhythm. No murmurs, rubs, or gallops. No edema.  Pedal pulses 2+ bilaterally.  GASTROINTESTINAL: Soft, nontender, nondistended. No masses. Positive bowel sounds. No hepatosplenomegaly.  MUSCULOSKELETAL: No swelling, clubbing, or edema. Range of motion full in all extremities.  NEUROLOGIC: Cranial nerves II through XII are intact. No gross focal neurological deficits. Sensation intact. Reflexes intact.  SKIN: No ulceration, lesions, rashes, or cyanosis. Skin warm and dry. Turgor intact.  PSYCHIATRIC: Mood, affect within normal limits. The patient is awake, alert and oriented x 3. Insight, judgment intact.       IMAGING    CT Angio Chest PE W and/or Wo Contrast  Result Date: 02/16/2021 CLINICAL DATA:  Hemoptysis.  Ovarian carcinoma EXAM: CT ANGIOGRAPHY CHEST WITH CONTRAST TECHNIQUE: Multidetector CT imaging of the chest was performed using the standard protocol during bolus administration of intravenous contrast. Multiplanar CT image reconstructions and MIPs were obtained to evaluate the vascular anatomy. CONTRAST:  44mL OMNIPAQUE IOHEXOL 350 MG/ML SOLN COMPARISON:  Chest radiograph February 16, 2021; chest CT February 12, 2021 FINDINGS:  Cardiovascular: There is no demonstrable pulmonary embolus. There is no thoracic aortic aneurysm or dissection. There are scattered foci of calcification in visualized great vessels. There is aortic atherosclerosis. There are foci of coronary artery calcification. There is no pericardial effusion or pericardial thickening. Mediastinum/Nodes: Thyroid appears normal. There are multiple stable subcentimeter mediastinal lymph nodes. No new adenopathy is evident. In comparison with recent study, there is now diffuse wall thickening throughout the esophagus extending from the upper cervical region to the gastroesophageal junction. There is loss of fat plane surrounding the esophagus. This rapid change from 4 days prior and history of hemoptysis/hematemesis suggests that there may be diffuse hemorrhage within the esophagus. No  pneumomediastinum is evident. Lungs/Pleura: There are underlying emphysematous changes. There are bronchiectatic changes in each lower lobe, stable. There are multiple areas of irregular consolidation in the left upper lobe toward the apex, stable, concerning for potential neoplastic foci. There is atelectatic change in the posterior left base, stable. No new parenchymal lung lesions are evident compared to 4 days prior. Upper Abdomen: Visualized upper abdominal structures appear unremarkable and stable. Musculoskeletal: No blastic or lytic bone lesions. No chest wall lesions evident. Review of the MIP images confirms the above findings. IMPRESSION: 1. No demonstrable pulmonary embolus. No thoracic aortic aneurysm or dissection. Aortic atherosclerosis as well as foci of great vessel and coronary artery calcification noted. 2. In comparison with 4 days prior, there is now diffuse esophageal wall thickening and relative enlargement of the esophageal contour extending from the upper thoracic esophagus to the gastroesophageal junction. Loss of fat plane throughout the esophageal region. Suspect diffuse esophageal hemorrhage given this appearance in the history. No pneumomediastinum. Etiology for esophageal hemorrhage uncertain. Neoplastic involvement in the esophagus potentially could cause hemorrhage. Hemorrhage secondary to underlying inflammation or potential chemotherapy response possible. This finding may warrant direct visualization of the esophagus. 3. Underlying emphysema. Areas of bronchiectatic change, stable. Multiple areas of opacity in the left upper lobe raise concern for underlying neoplasia. Appearance stable compared to 4 days prior. Atelectatic change noted in the left lung base. No new opacity evident. 4. Multiple small lymph nodes. No frank adenopathy evident by size criteria. A stable lymph node in the left supraclavicular region, upper limits of normal in size potentially could have neoplastic  etiology as noted in recent report. Aortic Atherosclerosis (ICD10-I70.0) and Emphysema (ICD10-J43.9). Electronically Signed   By: Lowella Grip III M.D.   On: 02/16/2021 15:51   CT CHEST ABDOMEN PELVIS W CONTRAST  Result Date: 02/13/2021 CLINICAL DATA:  Ovarian cancer, assess treatment response in the setting of peritoneal disease in this 65 year old. EXAM: CT CHEST, ABDOMEN, AND PELVIS WITH CONTRAST TECHNIQUE: Multidetector CT imaging of the chest, abdomen and pelvis was performed following the standard protocol during bolus administration of intravenous contrast. CONTRAST:  144mL OMNIPAQUE IOHEXOL 300 MG/ML  SOLN COMPARISON:  November 14, 2020.  In December 18, 2020, chest CT. FINDINGS: CT CHEST FINDINGS Cardiovascular: LEFT-sided Port-A-Cath terminates at the caval to atrial junction. Heart size normal without pericardial effusion. Central pulmonary vasculature unremarkable on venous phase assessment. Aorta with calcified and noncalcified plaque. Mediastinum/Nodes: Small thoracic inlet lymph nodes with mild enlargement, LEFT supraclavicular node on image 7 of series 2 approximately 1 cm, perhaps slightly enlarged compared to the previous exam where it measured approximately 7 8 mm short axis. Other small nodes in this location less than a cm are unchanged. No axillary lymphadenopathy. No mediastinal lymphadenopathy. No hilar lymphadenopathy. Small pre-vascular lymph nodes are similarly stable. RIGHT  paratracheal lymph node measuring 9 mm short axis previously approximately 11 mm. Esophagus mildly patulous. Lungs/Pleura: Marked pulmonary emphysema worse at the lung apices. Bronchial wall thickening the LEFT lung base, mild. Nodular masslike areas in the LEFT chest in the LEFT upper lobe, worsening since prior imaging 2.6 x 2.0 cm area on image 23 of series 3 in the LEFT upper lobe contiguous with pre-vascular opacity that was present on the prior study and much worse. Area of greatest axial dimension on  image 30 of series 3 is contiguous with the upper lobe masslike area described above and measures 5.3 x 1.8 cm. Anterior LEFT upper lobe with signs of nodularity as well (image 30, series 3) 1 cm nodule in this location is unchanged. Adjacent peripheral nodular areas are similar, some slightly decreased in others increased, for instance in area in the LEFT upper lobe measuring 1.7 cm long axis on image 19 of series 3 previously less than a cm. Tethering of the major fissure is a new phenomenon based on comparison with prior imaging retracted anteriorly. Nodule adjacent to the aortic arch in the medial LEFT upper lobe on image 38 of series 3 measures 1.2 cm previously approximately 7 mm when measured in a similar fashion Nodules have developed in the RIGHT lower lobe over time. There is however resolution of RIGHT-sided effusion near complete resolution of LEFT-sided pleural fluid since the prior study. Example of small RIGHT lower lobe nodule on image 104 of series 3 measuring 4 mm. Normal development also of RIGHT middle lobe nodule since the previous exam scattered throughout the RIGHT middle lobe some with tree-in-bud configuration Musculoskeletal: Spinal degenerative changes. No acute or destructive bone process. CT ABDOMEN PELVIS FINDINGS Hepatobiliary: No focal, suspicious hepatic lesion. No pericholecystic stranding. No biliary duct dilation. Pancreas: Normal, without mass, inflammation or ductal dilatation. Spleen: Spleen normal size and contour. Adrenals/Urinary Tract: Adrenal glands are normal. Symmetric renal enhancement. No hydronephrosis. Collapse of the urinary bladder limits assessment. No suspicious renal lesion. Stomach/Bowel: Stomach moderately distended. No perigastric stranding. No small bowel dilation. Appendix is normal. Stool throughout much of the colon. No signs of acute colonic process. Descending and sigmoid diverticulosis and diverticular disease. Vascular/Lymphatic: Calcified and  noncalcified atheromatous plaque of the abdominal aorta. There is no gastrohepatic or hepatoduodenal ligament lymphadenopathy. No retroperitoneal or mesenteric lymphadenopathy. Small lymph nodes about the celiac are slightly decreased since December of 2021 largest approximately 6 mm short axis previously approximately 9 mm on image 56 of series 2. No pelvic sidewall lymphadenopathy. 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  No adnexal masses. Other: Signs of peritoneal disease slightly diminished in terms of conspicuity and density but measuring similar greatest thickness at 2.1 cm in the mid abdomen, previously 2.3 cm disc involved the greater omentum. No ascites. Musculoskeletal: Spinal degenerative changes.  Osteopenia. IMPRESSION: 1. Continued slight decrease in peritoneal involvement and abdominal lymph nodes. 2. Nodular masslike areas in the LEFT upper lobe, with marked worsening since imaging from January of 2022 and not present on imaging from December of 2021 findings may represent worsening of infection or interval development of metastatic disease in the chest. Correlate with respiratory symptoms, consider PET scan for further evaluation. Ultimately, pulmonary consultation may be helpful. 3. Borderline enlarged LEFT supraclavicular lymph node, suspicious in light of other findings, could also be assessed with PET imaging. 4. Scattered small nodules in the RIGHT chest as well as described. 5. Aortic atherosclerosis. These results will be called to the ordering clinician or representative by the  Psychologist, clinical, and communication documented in the PACS or [employer]. Aortic Atherosclerosis (ICD10-I70.0) and Emphysema (ICD10-J43.9). Electronically Signed   By: Zetta Bills M.D.   On: 02/13/2021 08:18   DG Chest Port 1 View  Result Date: 02/16/2021 CLINICAL DATA:  65 year old female with cough and hemoptysis. EXAM: PORTABLE CHEST - 1 VIEW COMPARISON:  01/30/2021, 02/12/2021 FINDINGS: The  mediastinal contours are within normal limits. No cardiomegaly. Similar appearing left greater than right apical scarring. No new focal consolidation. No evidence of significant pleural effusion or pneumothorax. Left subclavian vein approach Port-A-Cath in place with the catheter tip at the cavoatrial junction. No acute osseous abnormality. IMPRESSION: Similar-appearing left greater than right apical scarring. No new focal consolidative process. Electronically Signed   By: Ruthann Cancer MD   On: 02/16/2021 14:50      ASSESSMENT/PLAN   Left upper lobe nodular lesion suspicious of possible bronchogenic carcinoma -Patient has personal history of gastric, ovarian and peritoneal carcinoma -She is high pretest probability of malignancy due to underlying COPD chronic hypoxemia and personal history of cancer. -Reviewed plan for today patient wishes to proceed with bronchoscopy -Reviewed risks/complications and benefits with patient, risks include infection, pneumothorax/pneumomediastinum which may require chest tube placement as well as overnight/prolonged hospitalization and possible mechanical ventilation. Other risks include bleeding and very rarely death.  Patient understands risks and wishes to proceed.  Additional questions were answered, and patient is aware that post procedure patient will be going home with family and may experience cough with possible clots on expectoration as well as phlegm which may last few days as well as hoarseness of voice post intubation and mechanical ventilation.     Thank you for allowing me to participate in the care of this patient.   Patient/Family are satisfied with care plan and all questions have been answered.  This document was prepared using Dragon voice recognition software and may include unintentional dictation errors.     Ottie Glazier, M.D.  Division of [place]

## 2021-03-09 NOTE — Discharge Instructions (Addendum)
Flexible Bronchoscopy  Flexible bronchoscopy is a procedure used to examine the passageways in the lungs. During the procedure, a thin, flexible tool with a camera (bronchoscope) is passed into the mouth or nose, down through the windpipe (trachea), and into the air tubes in the lungs (bronchi). This tool allows the health care provider to look inside the lungs and to take samples for testing, if needed. Tell a health care provider about:  Any allergies you have.  All medicines you are taking, including vitamins, herbs, eye drops, creams, and over-the-counter medicines.  Any problems you or family members have had with anesthetic medicines.  Any blood disorders you have.  Any surgeries you have had.  Any medical conditions you have.  Whether you are pregnant or may be pregnant. What are the risks? Generally, this is a safe procedure. However, problems may occur, including:  Infection.  Bleeding.  Damage to other structures or organs.  Allergic reactions to medicines.  Collapsed lung (pneumothorax).  Increased need for oxygen or difficulty breathing after the procedure. What happens before the procedure? Staying hydrated Follow instructions from your health care provider about hydration, which may include:  Up to 2 hours before the procedure - you may continue to drink clear liquids, such as water, clear fruit juice, black coffee, and plain tea.   Eating and drinking restrictions Follow instructions from your health care provider about eating and drinking, which may include:  8 hours before the procedure - stop eating heavy meals or foods, such as meat, fried foods, or fatty foods.  6 hours before the procedure - stop eating light meals or foods, such as toast or cereal.  6 hours before the procedure - stop drinking milk or drinks that contain milk.  2 hours before the procedure - stop drinking clear liquids. Medicines Ask your health care provider about:  Changing or  stopping your regular medicines. This is especially important if you are taking diabetes medicines or blood thinners.  Taking medicines such as aspirin and ibuprofen. These medicines can thin your blood. Do not take these medicines unless your health care provider tells you to take them.  Taking over-the-counter medicines, vitamins, herbs, and supplements. General instructions  You may be given antibiotic medicine to help lower the risk of infection.  Plan to have a responsible adult take you home from the hospital or clinic.  If you will be going home right after the procedure, plan to have a responsible adult care for you for the time you are told. This is important. What happens during the procedure?  An IV will be inserted into one of your veins.  You will be given a medicine (local anesthetic) to numb your mouth, nose, throat, and voice box (larynx). You may also be given one or more of the following: ? A medicine to help you relax (sedative). ? A medicine to control coughing. ? A medicine to dry up any fluids or secretions in your lungs.  A bronchoscope will be passed into your nose or mouth, and into your lungs. Your health care provider will examine your lungs.  Samples of airway secretions may be collected for testing.  If abnormal areas are seen in your airways, samples of tissue may be removed and checked under a microscope (biopsy).  If tissue samples are needed from the outer parts of the lung, a type of X-ray (fluoroscopy) may be used to guide the bronchoscope to these areas.  If bleeding occurs, you may be given medicine to  stop or decrease the bleeding. The procedure may vary among health care providers and hospitals. What can I expect after the procedure?  Your blood pressure, heart rate, breathing rate, and blood oxygen level will be monitored until you leave the hospital or clinic.  You may have a chest X-ray to check for signs of pneumothorax.  You willnot be  allowed to eat or drink anything for 2 hours after your procedure.  If a biopsy was taken, it is up to you to get the results of the test. Ask your health care provider, or the department that is doing the procedure, when your results will be ready.  You may have the following symptoms for 24-48 hours: ? A cough that is worse than it was before the procedure. ? A low-grade fever. ? A sore throat or hoarse voice. ? Some blood in the mucus from your lungs (sputum), if a biopsy was done. Follow these instructions at home: Eating and drinking  Do not eat or drink anything, including water, for 2 hours after your procedure, or until your numbing medicine has worn off. Having a numb throat increases your risk of burning yourself or choking.  Start eating soft foods and slowly drinking liquids after your numbness is gone and your cough and gag reflexes have returned.  You may return to your normal diet the day after the procedure. Driving  If you were given a sedative during the procedure, it can affect you for several hours. Do not drive or operate machinery until your health care provider says that it is safe.  Ask your health care provider if the medicine prescribed to you requires you to avoid driving or using machinery.  Return to your normal activities as told by your health care provider. Ask your health care provider what activities are safe for you. General instructions  Take over-the-counter and prescription medicines only as told by your health care provider.  Do not use any products that contain nicotine or tobacco. These products include cigarettes, chewing tobacco, and vaping devices, such as e-cigarettes. If you need help quitting, ask your health care provider.  Keep all follow-up visits. This is important.   Get help right away if:  You have shortness of breath that gets worse.  You become light-headed or feel like you might faint.  You have chest pain.  You cough up  more than a small amount of blood. These symptoms may represent a serious problem that is an emergency. Do not wait to see if the symptoms will go away. Get medical help right away. Call your local emergency services (911 in the [place] Do not drive yourself to the hospital. Summary  Flexible bronchoscopy is a procedure that allows your health care provider to look closely inside your lungs and to take testing samples if needed.  Risks of flexible bronchoscopy include bleeding, infection, and collapsed lung (pneumothorax).  Before the procedure, you will be given a medicine to numb your mouth, nose, throat, and voice box. Then, a bronchoscope will be passed into your nose or mouth, and into your lungs.  After the procedure, your blood pressure, heart rate, breathing rate, and blood oxygen level will be monitored until you leave the hospital or clinic. You may have a chest X-ray to check for signs of pneumothorax.  You will not be allowed to eat or drink anything for 2 hours after your procedure. This information is not intended to replace advice given to you by your health care provider.  Make sure you discuss any questions you have with your health care provider. Document Revised: 05/25/2020 Document Reviewed: 05/25/2020 Elsevier Patient Education  2021 [place]   1) The drugs that you were given will stay in your system until tomorrow so for the next 24 hours you should not:  A) Drive an automobile B) Make any legal decisions C) Drink any alcoholic beverage   2) You may resume regular meals tomorrow.  Today it is better to start with liquids and gradually work up to solid foods.  You may eat anything you prefer, but it is better to start with liquids, then soup and crackers, and gradually work up to solid foods.   3) Please notify your doctor immediately if you have any unusual bleeding, trouble breathing, redness and pain at the  surgery site, drainage, fever, or pain not relieved by medication.    4) Additional Instructions:   YOU MAY RESUME ALL OF YOUR REGULAR MEDICATIONS PER DR. ALESKEROV.        Please contact your physician with any problems or Same Day Surgery at [phone], Monday through Friday 6 am to 4 pm, or Luke at [facility] number at [phone].

## 2021-03-10 LAB — ACID FAST SMEAR (AFB, MYCOBACTERIA): Acid Fast Smear: NEGATIVE

## 2021-03-12 ENCOUNTER — Encounter (HOSPITAL_COMMUNITY): Payer: Medicare HMO | Admitting: Dietician

## 2021-03-12 ENCOUNTER — Encounter: Payer: Self-pay | Admitting: Pulmonary Disease

## 2021-03-12 LAB — SURGICAL PATHOLOGY

## 2021-03-12 LAB — CYTOLOGY - NON PAP

## 2021-03-12 NOTE — Telephone Encounter (Signed)
Nutrition Follow-up:  Patient with ovarian cancer. She is receiving chemotherapy.   Recent hospital admission 4/1-4/3 with upper GI bleeding. Pt is s/p bronchoscopy with left upper lobe nodular lesion suspicious for bronchogenic carcinoma on 4/22.   Spoke with patient via telephone this afternoon. Patient reports appetite is good, states she has been "eating a bunch of junk." She has not eaten much today, other than a donut because she had a doctor's appointment this morning. Patient has been eating ham, chicken, hard boiled eggs, ice cream, donuts, drinks coffee, 64 oz water, and usually 1 Carnation Breakfast Essential daily. Patient denies nutrition impact symptoms. She reports losing a couple of pounds early in the month when in the hospital.   Medications: reviewed  Labs: reviewed  Anthropometrics: Weight 114 lb 3.2 oz on 4/20 stable   3/30 - 114 lb 9.6 oz 3/9 - 117 lb  2/16 - 120 lb   NUTRITION DIAGNOSIS: Unintentional weight loss stable    INTERVENTION:  Continue drinking Medical illustrator at least 1 daily Discussed strategies for adding calories and protein Encouraged small frequent meals, including a protein     MONITORING, EVALUATION, GOAL: weight trends, intake   NEXT VISIT: Monday May 23 via telephone

## 2021-03-13 LAB — CULTURE, BAL-QUANTITATIVE W GRAM STAIN: Culture: >=100000 — AB

## 2021-03-21 ENCOUNTER — Inpatient Hospital Stay (HOSPITAL_COMMUNITY): Payer: Medicare HMO | Attending: Hematology

## 2021-03-21 ENCOUNTER — Inpatient Hospital Stay (HOSPITAL_COMMUNITY): Payer: Medicare HMO

## 2021-03-21 ENCOUNTER — Other Ambulatory Visit: Payer: Self-pay

## 2021-03-21 ENCOUNTER — Inpatient Hospital Stay (HOSPITAL_COMMUNITY): Payer: Medicare HMO | Admitting: Hematology

## 2021-03-21 VITALS — BP 126/62 | HR 86 | Temp 97.0°F | Resp 18

## 2021-03-21 VITALS — BP 137/71 | HR 95 | Temp 96.8°F | Resp 20 | Wt 113.8 lb

## 2021-03-21 DIAGNOSIS — C569 Malignant neoplasm of unspecified ovary: Secondary | ICD-10-CM

## 2021-03-21 DIAGNOSIS — Z7982 Long term (current) use of aspirin: Secondary | ICD-10-CM | POA: Insufficient documentation

## 2021-03-21 DIAGNOSIS — C786 Secondary malignant neoplasm of retroperitoneum and peritoneum: Secondary | ICD-10-CM

## 2021-03-21 DIAGNOSIS — J449 Chronic obstructive pulmonary disease, unspecified: Secondary | ICD-10-CM | POA: Insufficient documentation

## 2021-03-21 DIAGNOSIS — Z5189 Encounter for other specified aftercare: Secondary | ICD-10-CM | POA: Diagnosis not present

## 2021-03-21 DIAGNOSIS — Z87891 Personal history of nicotine dependence: Secondary | ICD-10-CM | POA: Diagnosis not present

## 2021-03-21 DIAGNOSIS — Z7952 Long term (current) use of systemic steroids: Secondary | ICD-10-CM | POA: Diagnosis not present

## 2021-03-21 DIAGNOSIS — Z95828 Presence of other vascular implants and grafts: Secondary | ICD-10-CM

## 2021-03-21 DIAGNOSIS — Z8042 Family history of malignant neoplasm of prostate: Secondary | ICD-10-CM | POA: Diagnosis not present

## 2021-03-21 DIAGNOSIS — Z8249 Family history of ischemic heart disease and other diseases of the circulatory system: Secondary | ICD-10-CM | POA: Insufficient documentation

## 2021-03-21 DIAGNOSIS — Z79899 Other long term (current) drug therapy: Secondary | ICD-10-CM | POA: Diagnosis not present

## 2021-03-21 DIAGNOSIS — Z5111 Encounter for antineoplastic chemotherapy: Secondary | ICD-10-CM | POA: Diagnosis not present

## 2021-03-21 LAB — CBC WITH DIFFERENTIAL/PLATELET
Abs Immature Granulocytes: 0.01 10*3/uL (ref 0.00–0.07)
Basophils Absolute: 0 10*3/uL (ref 0.0–0.1)
Basophils Relative: 1 %
Eosinophils Absolute: 0.1 10*3/uL (ref 0.0–0.5)
Eosinophils Relative: 3 %
HCT: 30.5 % — ABNORMAL LOW (ref 36.0–46.0)
Hemoglobin: 9.6 g/dL — ABNORMAL LOW (ref 12.0–15.0)
Immature Granulocytes: 0 %
Lymphocytes Relative: 38 %
Lymphs Abs: 1.6 10*3/uL (ref 0.7–4.0)
MCH: 32.9 pg (ref 26.0–34.0)
MCHC: 31.5 g/dL (ref 30.0–36.0)
MCV: 104.5 fL — ABNORMAL HIGH (ref 80.0–100.0)
Monocytes Absolute: 0.4 10*3/uL (ref 0.1–1.0)
Monocytes Relative: 11 %
Neutro Abs: 2 10*3/uL (ref 1.7–7.7)
Neutrophils Relative %: 47 %
Platelets: 196 10*3/uL (ref 150–400)
RBC: 2.92 MIL/uL — ABNORMAL LOW (ref 3.87–5.11)
RDW: 17.4 % — ABNORMAL HIGH (ref 11.5–15.5)
WBC: 4.1 10*3/uL (ref 4.0–10.5)
nRBC: 0 % (ref 0.0–0.2)

## 2021-03-21 LAB — COMPREHENSIVE METABOLIC PANEL
ALT: 10 U/L (ref 0–44)
AST: 20 U/L (ref 15–41)
Albumin: 3.8 g/dL (ref 3.5–5.0)
Alkaline Phosphatase: 62 U/L (ref 38–126)
Anion gap: 7 (ref 5–15)
BUN: 10 mg/dL (ref 8–23)
CO2: 33 mmol/L — ABNORMAL HIGH (ref 22–32)
Calcium: 8.9 mg/dL (ref 8.9–10.3)
Chloride: 100 mmol/L (ref 98–111)
Creatinine, Ser: 0.53 mg/dL (ref 0.44–1.00)
GFR, Estimated: >60 mL/min (ref >60)
Glucose, Bld: 97 mg/dL (ref 70–99)
Potassium: 3.5 mmol/L (ref 3.5–5.1)
Sodium: 140 mmol/L (ref 135–145)
Total Bilirubin: 0.4 mg/dL (ref 0.3–1.2)
Total Protein: 6.7 g/dL (ref 6.5–8.1)

## 2021-03-21 LAB — MAGNESIUM: Magnesium: 2 mg/dL (ref 1.7–2.4)

## 2021-03-21 MED ORDER — DIPHENHYDRAMINE HCL 50 MG/ML IJ SOLN
50.0000 mg | Freq: Once | INTRAMUSCULAR | Status: AC
  Administered 2021-03-21: 50 mg via INTRAVENOUS
  Filled 2021-03-21: qty 1

## 2021-03-21 MED ORDER — SODIUM CHLORIDE 0.9 % IV SOLN
10.0000 mg | Freq: Once | INTRAVENOUS | Status: AC
  Administered 2021-03-21: 10 mg via INTRAVENOUS
  Filled 2021-03-21: qty 10

## 2021-03-21 MED ORDER — HEPARIN SOD (PORK) LOCK FLUSH 100 UNIT/ML IV SOLN
500.0000 [IU] | Freq: Once | INTRAVENOUS | Status: AC | PRN
Start: 2021-03-21 — End: 2021-03-21
  Administered 2021-03-21: 500 [IU]

## 2021-03-21 MED ORDER — SODIUM CHLORIDE 0.9 % IV SOLN
449.0000 mg | Freq: Once | INTRAVENOUS | Status: AC
  Administered 2021-03-21: 450 mg via INTRAVENOUS
  Filled 2021-03-21: qty 45

## 2021-03-21 MED ORDER — FAMOTIDINE 20 MG IN NS 100 ML IVPB
20.0000 mg | Freq: Once | INTRAVENOUS | Status: AC
  Administered 2021-03-21: 20 mg via INTRAVENOUS
  Filled 2021-03-21: qty 20

## 2021-03-21 MED ORDER — PALONOSETRON HCL INJECTION 0.25 MG/5ML
0.2500 mg | Freq: Once | INTRAVENOUS | Status: AC
  Administered 2021-03-21: 0.25 mg via INTRAVENOUS
  Filled 2021-03-21: qty 5

## 2021-03-21 MED ORDER — SODIUM CHLORIDE 0.9% FLUSH
10.0000 mL | INTRAVENOUS | Status: DC | PRN
  Administered 2021-03-21 (×2): 10 mL

## 2021-03-21 MED ORDER — SODIUM CHLORIDE 0.9 % IV SOLN
140.0000 mg/m2 | Freq: Once | INTRAVENOUS | Status: AC
  Administered 2021-03-21: 216 mg via INTRAVENOUS
  Filled 2021-03-21: qty 36

## 2021-03-21 MED ORDER — SODIUM CHLORIDE 0.9 % IV SOLN: Freq: Once | INTRAVENOUS | Status: AC

## 2021-03-21 MED ORDER — SODIUM CHLORIDE 0.9 % IV SOLN
150.0000 mg | Freq: Once | INTRAVENOUS | Status: AC
  Administered 2021-03-21: 150 mg via INTRAVENOUS
  Filled 2021-03-21: qty 150

## 2021-03-21 NOTE — Progress Notes (Signed)
[name], [name]   CLINIC:  Medical Oncology/Hematology  PCP:  [name], [place] / [place] [address] [phone]   REASON FOR VISIT:  Follow-up for high-grade serous ovarian carcinoma  PRIOR THERAPY: None  NGS Results: BRCA 1/2 negative  CURRENT THERAPY: Carboplatin, paclitaxel & Aloxi every 3 weeks  BRIEF ONCOLOGIC HISTORY:  Oncology History  Peritoneal carcinomatosis ([place])  06/06/2020 Initial Diagnosis   Peritoneal carcinomatosis ([place])   07/05/2020 -  Chemotherapy    Patient is on Treatment Plan: OVARIAN CARBOPLATIN (AUC 6) / PACLITAXEL (175) Q21D X 6 CYCLES      07/22/2020 Genetic Testing   Negative genetic testing: no pathogenic variants detected in [place] Expanded CancerNext Panel + RNAInsight. The CancerNext-Expanded gene panel offered by [facility] and includes sequencing and rearrangement analysis for the following 77 genes: AIP, ALK, APC*, ATM*, AXIN2, BAP1, BARD1, BLM, BMPR1A, BRCA1*, BRCA2*, BRIP1*, CDC73, CDH1*, CDK4, CDKN1B, CDKN2A, CHEK2*, CTNNA1, DICER1, FANCC, FH, FLCN, GALNT12, KIF1B, LZTR1, MAX, MEN1, MET, MLH1*, MSH2*, MSH3, MSH6*, MUTYH*, NBN, NF1*, NF2, NTHL1, PALB2*, PHOX2B, PMS2*, POT1, PRKAR1A, PTCH1, PTEN*, RAD51C*, RAD51D*, RB1, RECQL, RET, SDHA, SDHAF2, SDHB, SDHC, SDHD, SMAD4, SMARCA4, SMARCB1, SMARCE1, STK11, SUFU, TMEM127, TP53*, TSC1, TSC2, VHL and XRCC2 (sequencing and deletion/duplication); EGFR, EGLN1, HOXB13, KIT, MITF, PDGFRA, POLD1, and POLE (sequencing only); EPCAM and GREM1 (deletion/duplication only). DNA and RNA analyses performed for * genes. The report date is July 22, 2020.      CANCER STAGING: Cancer Staging No matching staging information was found for the patient.  INTERVAL HISTORY:  [name], a 65 y.o. female, returns for routine follow-up and consideration for next cycle of chemotherapy. [name] was last seen on 03/07/2021.  Due for cycle  #12 of carboplatin, paclitaxel and Aloxi today.   Overall, she tells me she has been feeling okay. She had her video bronchoscopy with Dr. Lanney Gins on 04/22. She was prescribed fluconazole for her throat yeast infection and Levaquin 500 mg for 14 days on 04/05. Her cough has been dry for the past several days. She continues taking gabapentin and her neuropathy is stable. She denies having any F/C or night sweats. Her appetite is still decreased since her bronchoscopy but she denies having abdominal pain, N/V/D/C or leg swelling. She is not drinking any Carnation.  Overall, she feels ready for next cycle of chemo today.    REVIEW OF SYSTEMS:  Review of Systems  Constitutional: Positive for appetite change (50%) and fatigue (50%). Negative for chills, diaphoresis and fever.  Cardiovascular: Negative for leg swelling.  Gastrointestinal: Negative for abdominal pain, constipation, diarrhea, nausea and vomiting.  Neurological: Positive for numbness (stable).  All other systems reviewed and are negative.   PAST MEDICAL/SURGICAL HISTORY:  Past Medical History:  Diagnosis Date  . Anemia   . Aortic atherosclerosis ([place])   . Arthritis   . Asthma   . Bilateral carotid artery stenosis   . Cancer [facility])    Gastric Cancer  . COPD (chronic obstructive pulmonary disease) ([place])   . High cholesterol   . Hyperlipidemia   . Hypertension   . Neuropathy   . Osteoarthritis   . Ovarian cancer ([place])   . Oxygen dependent   . Peripheral vascular disease ([place])   . Peritoneal carcinoma ([place])   . Pneumonia 2019  . Port-A-Cath in place 07/03/2020  . Pulmonary emphysema ([place])    Past Surgical History:  Procedure Laterality Date  .  ESOPHAGOGASTRODUODENOSCOPY (EGD) WITH PROPOFOL N/A 02/17/2021   Procedure: ESOPHAGOGASTRODUODENOSCOPY (EGD) WITH PROPOFOL;  Surgeon: Harvel Quale, MD;  Location[facility];  Service: Gastroenterology;  Laterality: N/A;  . HALLUX VALGUS BASE WEDGE Right 06/09/2015    Procedure: Base wedge osteotomy with modified McBride right foot ;  Surgeon: Sharlotte Alamo, MD;  Location: [facility] ORS;  Service: Podiatry;  Laterality: Right;  . PORTACATH PLACEMENT Left 06/28/2020   Procedure: PORT-A-CATHETER PLACEMENT LEFT CHEST (attached catheter in left subclavian);  Surgeon: Virl Cagey, MD;  Location: [facility];  Service: General;  Laterality: Left;  . TUBAL LIGATION    . VIDEO BRONCHOSCOPY WITH ENDOBRONCHIAL NAVIGATION N/A 03/09/2021   Procedure: VIDEO BRONCHOSCOPY WITH ENDOBRONCHIAL NAVIGATION;  Surgeon: Ottie Glazier, MD;  Location: [facility] ORS;  Service: Thoracic;  Laterality: N/A;  . VIDEO BRONCHOSCOPY WITH ENDOBRONCHIAL ULTRASOUND N/A 03/09/2021   Procedure: VIDEO BRONCHOSCOPY WITH ENDOBRONCHIAL ULTRASOUND;  Surgeon: Ottie Glazier, MD;  Location: [facility] ORS;  Service: Thoracic;  Laterality: N/A;    SOCIAL HISTORY:  Social History   Socioeconomic History  . Marital status: Divorced    Spouse name: Not on file  . Number of children: 3  . Years of education: Not on file  . Highest education level: Not on file  Occupational History  . Occupation: DISABLED  Tobacco Use  . Smoking status: Former Smoker    Packs/day: 2.00    Years: 30.00    Pack years: 60.00    Types: Cigarettes    Quit date: 11/04/2013    Years since quitting: 7.3  . Smokeless tobacco: Never Used  Vaping Use  . Vaping Use: Never used  Substance and Sexual Activity  . Alcohol use: No  . Drug use: No  . Sexual activity: Not Currently  Other Topics Concern  . Not on file  Social History Narrative  . Not on file   Social Determinants of Health   Financial Resource Strain: Low Risk   . Difficulty of Paying Living Expenses: Not hard at all  Food Insecurity: No Food Insecurity  . Worried About Charity fundraiser in the Last Year: Never true  . Ran Out of Food in the Last Year: Never true  Transportation Needs: No Transportation Needs  . Lack of Transportation (Medical): No  . Lack of  Transportation (Non-Medical): No  Physical Activity: Inactive  . Days of Exercise per Week: 0 days  . Minutes of Exercise per Session: 0 min  Stress: No Stress Concern Present  . Feeling of Stress : Not at all  Social Connections: Socially Isolated  . Frequency of Communication with Friends and Family: More than three times a week  . Frequency of Social Gatherings with Friends and Family: Never  . Attends Religious Services: Never  . Active Member of Clubs or Organizations: No  . Attends Archivist Meetings: Never  . Marital Status: Divorced  Human resources officer Violence: Not At Risk  . Fear of Current or Ex-Partner: No  . Emotionally Abused: No  . Physically Abused: No  . Sexually Abused: No    FAMILY HISTORY:  Family History  Problem Relation Age of Onset  . Alzheimer's disease Mother   . COPD Father   . Emphysema Father   . Hypertension Father   . Healthy Sister   . Healthy Brother   . Alzheimer's disease Maternal Grandmother   . Healthy Sister   . Healthy Sister   . Healthy Sister   . Prostate cancer Other  paternal grandmother's brother; dx in early 72s  . Breast cancer Neg Hx     CURRENT MEDICATIONS:  Current Outpatient Medications  Medication Sig Dispense Refill  . acetylcysteine (MUCOMYST) 20 % nebulizer solution Inhale 4 mLs into the lungs every 6 (six) hours as needed (COPD).    Marland Kitchen albuterol (PROVENTIL HFA;VENTOLIN HFA) 108 (90 BASE) MCG/ACT inhaler Inhale 4-6 puffs into the lungs every 6 (six) hours as needed for wheezing or shortness of breath.    Marland Kitchen amLODipine (NORVASC) 5 MG tablet Take 5 mg by mouth at bedtime.     Marland Kitchen aspirin EC 81 MG tablet Take 81 mg by mouth daily.    Marland Kitchen CARBOPLATIN IV Inject into the vein every 21 ( twenty-one) days.    . ferrous sulfate 325 (65 FE) MG tablet Take 325 mg by mouth every other day.    . Fluticasone-Umeclidin-Vilant (TRELEGY ELLIPTA) 100-62.5-25 MCG/INH AEPB Inhale 1 puff into the lungs 2 (two) times daily.    Marland Kitchen  gabapentin (NEURONTIN) 600 MG tablet Take 600 mg by mouth 2 (two) times daily.    Marland Kitchen ipratropium (ATROVENT HFA) 17 MCG/ACT inhaler Inhale 2 puffs into the lungs every 6 (six) hours as needed for wheezing.     Marland Kitchen ipratropium-albuterol (DUONEB) 0.5-2.5 (3) MG/3ML SOLN Take 0.5 mg by nebulization every 6 (six) hours as needed (COPD).    Marland Kitchen levofloxacin (LEVAQUIN) 500 MG tablet Take 500 mg by mouth daily.    . magnesium oxide (MAG-OX) 400 (241.3 Mg) MG tablet Take 1 tablet (400 mg total) by mouth 2 (two) times daily. 60 tablet 3  . nortriptyline (PAMELOR) 25 MG capsule Take 25 mg by mouth at bedtime.    . ondansetron (ZOFRAN ODT) 4 MG disintegrating tablet 6m ODT q4 hours prn nausea/vomit (Patient taking differently: Take 4 mg by mouth every 4 (four) hours as needed for nausea or vomiting.) 12 tablet 0  . OXYGEN Inhale 3 L into the lungs continuous.     .Marland KitchenPACLITAXEL IV Inject into the vein every 21 ( twenty-one) days.    . pantoprazole (PROTONIX) 40 MG tablet Take 1 tablet (40 mg total) by mouth daily. 30 tablet 2  . predniSONE (DELTASONE) 5 MG tablet Take 5 mg by mouth daily with breakfast.    . simvastatin (ZOCOR) 40 MG tablet Take 40 mg by mouth at bedtime.     No current facility-administered medications for this visit.    ALLERGIES:  No Known Allergies  PHYSICAL EXAM:  Performance status (ECOG): 1 - Symptomatic but completely ambulatory  Vitals:   03/21/21 0752  BP: 137/71  Pulse: 95  Resp: 20  Temp: (!) 96.8 F (36 C)  SpO2: 98%   Wt Readings from Last 3 Encounters:  03/21/21 113 lb 12.8 oz (51.6 kg)  03/07/21 114 lb 3.2 oz (51.8 kg)  03/02/21 114 lb (51.7 kg)   Physical Exam Vitals reviewed.  Constitutional:      Appearance: Normal appearance.  Cardiovascular:     Rate and Rhythm: Normal rate and regular rhythm.     Pulses: Normal pulses.     Heart sounds: Normal heart sounds.  Pulmonary:     Effort: Pulmonary effort is normal.     Breath sounds: Examination of the  right-lower field reveals rhonchi. Examination of the left-lower field reveals rhonchi. Rhonchi present.  Chest:     Comments: Port-a-Cath in L chest Abdominal:     Palpations: Abdomen is soft. There is no mass.     Tenderness:  There is no abdominal tenderness.  Musculoskeletal:     Right lower leg: No edema.     Left lower leg: No edema.  Neurological:     General: No focal deficit present.     Mental Status: She is alert and oriented to person, place, and time.  Psychiatric:        Mood and Affect: Mood normal.        Behavior: Behavior normal.     LABORATORY DATA:  I have reviewed the labs as listed.  CBC Latest Ref Rng & Units 03/21/2021 03/07/2021 03/06/2021  WBC 4.0 - 10.5 K/uL 4.1 6.0 5.1  Hemoglobin 12.0 - 15.0 g/dL 9.6(L) 8.7(L) 8.4(L)  Hematocrit 36.0 - 46.0 % 30.5(L) 27.8(L) 26.4(L)  Platelets 150 - 400 K/uL 196 115(L) 115(L)   CMP Latest Ref Rng & Units 03/21/2021 03/07/2021 02/17/2021  Glucose 70 - 99 mg/dL 97 105(H) 99  BUN 8 - 23 mg/dL '10 16 15  ' Creatinine 0.44 - 1.00 mg/dL 0.53 0.51 0.36(L)  Sodium 135 - 145 mmol/L 140 138 140  Potassium 3.5 - 5.1 mmol/L 3.5 3.6 4.0  Chloride 98 - 111 mmol/L 100 98 101  CO2 22 - 32 mmol/L 33(H) 29 30  Calcium 8.9 - 10.3 mg/dL 8.9 8.7(L) 8.7(L)  Total Protein 6.5 - 8.1 g/dL 6.7 6.8 6.1(L)  Total Bilirubin 0.3 - 1.2 mg/dL 0.4 0.6 0.6  Alkaline Phos 38 - 126 U/L 62 72 59  AST 15 - 41 U/L 20 18 12(L)  ALT 0 - 44 U/L '10 12 8    ' DIAGNOSTIC IMAGING:  I have independently reviewed the scans and discussed with the patient. DG C-Arm 1-60 Min-No Report  Result Date: 03/09/2021 Fluoroscopy was utilized by the requesting physician.  No radiographic interpretation.     ASSESSMENT:  1. Peritoneal carcinomatosis: -Presentation to the ER with right lower quadrant abdominal pain on and off for 1 month. -CTAP on 05/29/2020 showed extensive nodularity throughout the omentum and upper peritoneal cavity. Both ovaries are prominent although no  well-defined mass is noted.Small volume ascites. -CA-125 is elevated at 2407. CEA was 2.4. -CT of the chest shows 11 mm right paratracheal lymph node and 11 mm short axis precarinal lymph node. Subcarinal lymphadenopathy measuring 14 mm short axis. This is suspicious for metastatic disease. 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 -Cycle 1 of carboplatin and paclitaxel on 07/05/2020. -Evaluated by Dr. Denman George on 07/07/2020. Reevaluation after 3 cycles. -CT CAP from 09/01/2020 showed mild improvement with reduction in the omental caking. Stable to minimally reduced adenopathy in the chest and abdomen. New 4 mm right upper lobe lung nodule could be inflammatory. -CT CAP on 11/07/2019 and after 6 cycles showed improved peritoneal disease/omental caking. 9 mm short axis portacaval lymph node and adjacent upper abdominal lymph nodes measuring 12 mm in short axis. Small retroperitoneal lymph nodes up to 5 mm improved. Peritoneal disease/omental caking now measures 2.4 cm in thickness. -CA-125 on 10/18/2020 improved 52. -Plan for surgical resection after 6 cycles. Unfortunately she was not cleared for surgery by anesthesia/pulmonary. - CT CAP on 02/12/2021 showed peritoneal disease and abdominal lymph nodes have improved.  However new masslike areas in the left upper lobe, new since January 31 CT scan along with borderline enlargement of the left supraclavicular lymph node. - She underwent bronchoscopy and biopsy on 03/09/2021 which was benign.  2. COPD: -Quit smoking 6 years ago.  Smoked 2 packs/day for 25-30 years. -Has been on3 L/min oxygen via nasal cannulafor the last 5 years.  3. Family history: -Paternal grandmother with colon cancer.   PLAN:  1.High-grade serous ovarian carcinoma: -She had bronchoscopy and biopsy on  03/09/2021.  The lesion in the left upper lobe was found to be benign.  Culture showed Pseudomonas which was sensitive to Cipro.  She already received 14 days of Levaquin. - She is completely asymptomatic of any infection at this time. - We reviewed her labs which showed white count 4.1 with normal differential.  LFTs are normal. - She will proceed with her next cycle of carboplatin and paclitaxel today.  Paclitaxel will be dose reduced. - RTC 3 weeks for follow-up.  We will plan to check CA125.  2. Family history: -Germline mutation testing is negative.  3. Peripheral neuropathy: -Continue gabapentin 300 mg 3 times a day.  Neuropathy stable.  4.Severe COPD: -Continue prednisone, Trelegy and Xopenex.  5. Hypomagnesemia: -Continue magnesium daily.   Orders placed this encounter:  Orders Placed This Encounter  Procedures  . CBC with Differential/Platelet  . Comprehensive metabolic panel  . CA 125  . Magnesium     Derek Jack, MD [place] [phone]   I, Milinda Antis, am acting as a scribe for Dr. Sanda Linger.  I, Derek Jack MD, have reviewed the above documentation for accuracy and completeness, and I agree with the above.

## 2021-03-21 NOTE — Progress Notes (Signed)
Patient tolerated chemotherapy; Carboplatin & Paclitaxel, with no complaints voiced. Side effects with management reviewed understanding verbalized. Port site clean and dry with no bruising or swelling noted at site. Good blood return noted before and after administration of chemotherapy. Band aid applied. Patient left in satisfactory condition with VSS and no s/s of distress noted.

## 2021-03-21 NOTE — Patient Instructions (Signed)
[place] at [facility] Discharge Instructions  You were seen today by [name]. He went over your recent results. You received your treatment today. Start drinking 1-2 cans of Carnation daily to maintain your weight and energy levels. [name] will see you back in 3 weeks for labs and follow up.   Thank you for choosing [place] at [facility] to provide your oncology and hematology care.  To afford each patient quality time with our provider, please arrive at least 15 minutes before your scheduled appointment time.   If you have a lab appointment with the [facility] please come in thru the Main Entrance and check in at the main information desk  You need to re-schedule your appointment should you arrive 10 or more minutes late.  We strive to give you quality time with our providers, and arriving late affects you and other patients whose appointments are after yours.  Also, if you no show three or more times for appointments you may be dismissed from the clinic at the providers discretion.     Again, thank you for choosing [facility].  Our hope is that these requests will decrease the amount of time that you wait before being seen by our physicians.       _____________________________________________________________  Should you have questions after your visit to [facility], please contact our office at [phone] between the hours of 8:00 a.m. and 4:30 p.m.  Voicemails left after 4:00 p.m. will not be returned until the following business day.  For prescription refill requests, have your pharmacy contact our office and allow 72 hours.    Cancer Center Support Programs:   > Cancer Support Group  2nd Tuesday of the month 1pm-2pm, Journey Room

## 2021-03-21 NOTE — Patient Instructions (Signed)
[facility]  Discharge Instructions: Thank you for choosing [facility] to provide your oncology and hematology care.  If you have a lab appointment with the Cancer Center, please come in thru the Main Entrance and check in at the main information desk.  Wear comfortable clothing and clothing appropriate for easy access to any Portacath or PICC line.   We strive to give you quality time with your provider. You may need to reschedule your appointment if you arrive late (15 or more minutes).  Arriving late affects you and other patients whose appointments are after yours.  Also, if you miss three or more appointments without notifying the office, you may be dismissed from the clinic at the provider's discretion.      For prescription refill requests, have your pharmacy contact our office and allow 72 hours for refills to be completed.    Today you received the following chemotherapy and/or immunotherapy agents Carboplatin/Paclitaxel, return as scheduled.   To help prevent nausea and vomiting after your treatment, we encourage you to take your nausea medication as directed.  BELOW ARE SYMPTOMS THAT SHOULD BE REPORTED IMMEDIATELY: *FEVER GREATER THAN 100.4 F (38 C) OR HIGHER *CHILLS OR SWEATING *NAUSEA AND VOMITING THAT IS NOT CONTROLLED WITH YOUR NAUSEA MEDICATION *UNUSUAL SHORTNESS OF BREATH *UNUSUAL BRUISING OR BLEEDING *URINARY PROBLEMS (pain or burning when urinating, or frequent urination) *BOWEL PROBLEMS (unusual diarrhea, constipation, pain near the anus) TENDERNESS IN MOUTH AND THROAT WITH OR WITHOUT PRESENCE OF ULCERS (sore throat, sores in mouth, or a toothache) UNUSUAL RASH, SWELLING OR PAIN  UNUSUAL VAGINAL DISCHARGE OR ITCHING   Items with * indicate a potential emergency and should be followed up as soon as possible or go to the Emergency Department if any problems should occur.  Please show the CHEMOTHERAPY ALERT CARD or IMMUNOTHERAPY ALERT CARD at  check-in to the Emergency Department and triage nurse.  Should you have questions after your visit or need to cancel or reschedule your appointment, please contact [facility] [phone]  and follow the prompts.  Office hours are 8:00 a.m. to 4:30 p.m. Monday - Friday. Please note that voicemails left after 4:00 p.m. may not be returned until the following business day.  We are closed weekends and major holidays. You have access to a nurse at all times for urgent questions. Please call the main number to the clinic [phone] and follow the prompts.  For any non-urgent questions, you may also contact your provider using MyChart. We now offer e-Visits for anyone 18 and older to request care online for non-urgent symptoms. For details visit [email or web address].   Also download the MyChart app! Go to the app store, search "MyChart", open the app, select [facility], and log in with your MyChart username and password.  Due to Covid, a mask is required upon entering the hospital/clinic. If you do not have a mask, one will be given to you upon arrival. For doctor visits, patients may have 1 support person aged 18 or older with them. For treatment visits, patients cannot have anyone with them due to current Covid guidelines and our immunocompromised population.  

## 2021-03-23 ENCOUNTER — Inpatient Hospital Stay (HOSPITAL_COMMUNITY): Payer: Medicare HMO

## 2021-03-23 ENCOUNTER — Encounter (HOSPITAL_COMMUNITY): Payer: Self-pay

## 2021-03-23 ENCOUNTER — Other Ambulatory Visit: Payer: Self-pay

## 2021-03-23 VITALS — BP 101/55 | HR 79 | Temp 97.4°F | Resp 19

## 2021-03-23 DIAGNOSIS — Z95828 Presence of other vascular implants and grafts: Secondary | ICD-10-CM

## 2021-03-23 DIAGNOSIS — C786 Secondary malignant neoplasm of retroperitoneum and peritoneum: Secondary | ICD-10-CM

## 2021-03-23 DIAGNOSIS — Z5111 Encounter for antineoplastic chemotherapy: Secondary | ICD-10-CM | POA: Diagnosis not present

## 2021-03-23 MED ORDER — PEGFILGRASTIM-CBQV 6 MG/0.6ML ~~LOC~~ SOSY
6.0000 mg | PREFILLED_SYRINGE | Freq: Once | SUBCUTANEOUS | Status: AC
Start: 2021-03-23 — End: 2021-03-23
  Administered 2021-03-23: 6 mg via SUBCUTANEOUS

## 2021-03-23 MED ORDER — PEGFILGRASTIM-CBQV 6 MG/0.6ML ~~LOC~~ SOSY
PREFILLED_SYRINGE | SUBCUTANEOUS | Status: AC
  Filled 2021-03-23: qty 0.6

## 2021-03-23 NOTE — Progress Notes (Signed)
Patient tolerated Udenyca injection with no complaints voiced. Site clean and dry with no bruising or swelling noted at site. See MAR for details. Band aid applied.  Patient stable during and after injection. VSS with discharge and left in satisfactory condition with no s/s of distress noted.

## 2021-03-23 NOTE — Patient Instructions (Signed)
[facility]  Discharge Instructions: Thank you for choosing [facility] to provide your oncology and hematology care.  If you have a lab appointment with the [facility], please come in thru the Main Entrance and check in at the main information desk.  Wear comfortable clothing and clothing appropriate for easy access to any Portacath or PICC line.   We strive to give you quality time with your provider. You may need to reschedule your appointment if you arrive late (15 or more minutes).  Arriving late affects you and other patients whose appointments are after yours.  Also, if you miss three or more appointments without notifying the office, you may be dismissed from the clinic at the provider's discretion.      For prescription refill requests, have your pharmacy contact our office and allow 72 hours for refills to be completed.    Today you received the following chemotherapy and/or immunotherapy agents: Udenyca Injection     To help prevent nausea and vomiting after your treatment, we encourage you to take your nausea medication as directed.  BELOW ARE SYMPTOMS THAT SHOULD BE REPORTED IMMEDIATELY: . *FEVER GREATER THAN 100.4 F (38 C) OR HIGHER . *CHILLS OR SWEATING . *NAUSEA AND VOMITING THAT IS NOT CONTROLLED WITH YOUR NAUSEA MEDICATION . *UNUSUAL SHORTNESS OF BREATH . *UNUSUAL BRUISING OR BLEEDING . *URINARY PROBLEMS (pain or burning when urinating, or frequent urination) . *BOWEL PROBLEMS (unusual diarrhea, constipation, pain near the anus) . TENDERNESS IN MOUTH AND THROAT WITH OR WITHOUT PRESENCE OF ULCERS (sore throat, sores in mouth, or a toothache) . UNUSUAL RASH, SWELLING OR PAIN  . UNUSUAL VAGINAL DISCHARGE OR ITCHING   Items with * indicate a potential emergency and should be followed up as soon as possible or go to the Emergency Department if any problems should occur.  Please show the CHEMOTHERAPY ALERT CARD or IMMUNOTHERAPY ALERT CARD at  check-in to the Emergency Department and triage nurse.  Should you have questions after your visit or need to cancel or reschedule your appointment, please contact [facility] [phone]  and follow the prompts.  Office hours are 8:00 a.m. to 4:30 p.m. Monday - Friday. Please note that voicemails left after 4:00 p.m. may not be returned until the following business day.  We are closed weekends and major holidays. You have access to a nurse at all times for urgent questions. Please call the main number to the clinic [phone] and follow the prompts.  For any non-urgent questions, you may also contact your provider using MyChart. We now offer e-Visits for anyone 14 and older to request care online for non-urgent symptoms. For details visit [email or web address].   Also download the MyChart app! Go to the app store, search "MyChart", open the app, select [facility], and log in with your MyChart username and password.  Due to Covid, a mask is required upon entering the hospital/clinic. If you do not have a mask, one will be given to you upon arrival. For doctor visits, patients may have 1 support person aged 61 or older with them. For treatment visits, patients cannot have anyone with them due to current Covid guidelines and our immunocompromised population.

## 2021-03-30 LAB — CULTURE, FUNGUS WITHOUT SMEAR

## 2021-04-09 ENCOUNTER — Inpatient Hospital Stay (HOSPITAL_COMMUNITY): Payer: Medicare HMO | Admitting: Dietician

## 2021-04-09 NOTE — Progress Notes (Signed)
Nutrition  Patient scheduled for nutrition follow-up via telephone. Patient did not answer. Unable to leave message on voicemail.

## 2021-04-10 NOTE — Progress Notes (Signed)
[name], [name] [id number]   CLINIC:  Medical Oncology/Hematology  PCP:  [name], [address] / [place] [address] [phone]   REASON FOR VISIT:  Follow-up for high-grade serous ovarian carcinoma  PRIOR THERAPY: none  NGS Results: BRCA 1/2 negative  CURRENT THERAPY: Carboplatin, paclitaxel & Aloxi every 3 weeks  BRIEF ONCOLOGIC HISTORY:  Oncology History  Peritoneal carcinomatosis ([place])  06/06/2020 Initial Diagnosis   Peritoneal carcinomatosis ([place])   07/05/2020 -  Chemotherapy    Patient is on Treatment Plan: OVARIAN CARBOPLATIN (AUC 6) / PACLITAXEL (175) Q21D X 6 CYCLES      07/22/2020 Genetic Testing   Negative genetic testing: no pathogenic variants detected in [facility] Expanded CancerNext Panel + RNAInsight. The CancerNext-Expanded gene panel offered by [facility] and includes sequencing and rearrangement analysis for the following 77 genes: AIP, ALK, APC*, ATM*, AXIN2, BAP1, BARD1, BLM, BMPR1A, BRCA1*, BRCA2*, BRIP1*, CDC73, CDH1*, CDK4, CDKN1B, CDKN2A, CHEK2*, CTNNA1, DICER1, FANCC, FH, FLCN, GALNT12, KIF1B, LZTR1, MAX, MEN1, MET, MLH1*, MSH2*, MSH3, MSH6*, MUTYH*, NBN, NF1*, NF2, NTHL1, PALB2*, PHOX2B, PMS2*, POT1, PRKAR1A, PTCH1, PTEN*, RAD51C*, RAD51D*, RB1, RECQL, RET, SDHA, SDHAF2, SDHB, SDHC, SDHD, SMAD4, SMARCA4, SMARCB1, SMARCE1, STK11, SUFU, TMEM127, TP53*, TSC1, TSC2, VHL and XRCC2 (sequencing and deletion/duplication); EGFR, EGLN1, HOXB13, KIT, MITF, PDGFRA, POLD1, and POLE (sequencing only); EPCAM and GREM1 (deletion/duplication only). DNA and RNA analyses performed for * genes. The report date is July 22, 2020.      CANCER STAGING: Cancer Staging No matching staging information was found for the patient.  INTERVAL HISTORY:  [name], a 65 y.o. female, returns for routine follow-up and consideration for next cycle of chemotherapy. Brittanyann was last seen on 03/21/2021.  Due for cycle  #13 of Carboplatin/Taxol today.   Overall, she tells me she has been feeling pretty well. She denies any SOB, cough, numbness or tingling. She also denies n/v/d/c. She is taking magnesium BID. She gained 3 lbs. She reports a good appetite.   Overall, she feels ready for next cycle of chemo today.   REVIEW OF SYSTEMS:  Review of Systems  Constitutional: Negative for appetite change and fatigue.  Respiratory: Negative for cough and shortness of breath.   Gastrointestinal: Negative for constipation, diarrhea, nausea and vomiting.  Neurological: Negative for numbness.  Psychiatric/Behavioral: Positive for sleep disturbance.  All other systems reviewed and are negative.   PAST MEDICAL/SURGICAL HISTORY:  Past Medical History:  Diagnosis Date  . Anemia   . Aortic atherosclerosis ([place])   . Arthritis   . Asthma   . Bilateral carotid artery stenosis   . Cancer [facility])    Gastric Cancer  . COPD (chronic obstructive pulmonary disease) ([place])   . High cholesterol   . Hyperlipidemia   . Hypertension   . Neuropathy   . Osteoarthritis   . Ovarian cancer ([place])   . Oxygen dependent   . Peripheral vascular disease ([place])   . Peritoneal carcinoma ([place])   . Pneumonia 2019  . Port-A-Cath in place 07/03/2020  . Pulmonary emphysema ([place])    Past Surgical History:  Procedure Laterality Date  . ESOPHAGOGASTRODUODENOSCOPY (EGD) WITH PROPOFOL N/A 02/17/2021   Procedure: ESOPHAGOGASTRODUODENOSCOPY (EGD) WITH PROPOFOL;  Surgeon: Harvel Quale, MD;  [facility];  Service: Gastroenterology;  Laterality: N/A;  . HALLUX VALGUS BASE WEDGE Right 06/09/2015   Procedure: Base wedge osteotomy with modified McBride right foot ;  Surgeon: Sharlotte Alamo, MD;  Location: [facility] ORS;  Service: Podiatry;  Laterality: Right;  . PORTACATH PLACEMENT Left 06/28/2020   Procedure: PORT-A-CATHETER PLACEMENT LEFT CHEST (attached catheter in left subclavian);  Surgeon: Virl Cagey, MD;  Location: [facility] ORS;   Service: General;  Laterality: Left;  . TUBAL LIGATION    . VIDEO BRONCHOSCOPY WITH ENDOBRONCHIAL NAVIGATION N/A 03/09/2021   Procedure: VIDEO BRONCHOSCOPY WITH ENDOBRONCHIAL NAVIGATION;  Surgeon: Ottie Glazier, MD;  Location: [facility] ORS;  Service: Thoracic;  Laterality: N/A;  . VIDEO BRONCHOSCOPY WITH ENDOBRONCHIAL ULTRASOUND N/A 03/09/2021   Procedure: VIDEO BRONCHOSCOPY WITH ENDOBRONCHIAL ULTRASOUND;  Surgeon: Ottie Glazier, MD;  Location: [facility] ORS;  Service: Thoracic;  Laterality: N/A;    SOCIAL HISTORY:  Social History   Socioeconomic History  . Marital status: Divorced    Spouse name: Not on file  . Number of children: 3  . Years of education: Not on file  . Highest education level: Not on file  Occupational History  . Occupation: DISABLED  Tobacco Use  . Smoking status: Former Smoker    Packs/day: 2.00    Years: 30.00    Pack years: 60.00    Types: Cigarettes    Quit date: 11/04/2013    Years since quitting: 7.4  . Smokeless tobacco: Never Used  Vaping Use  . Vaping Use: Never used  Substance and Sexual Activity  . Alcohol use: No  . Drug use: No  . Sexual activity: Not Currently  Other Topics Concern  . Not on file  Social History Narrative  . Not on file   Social Determinants of Health   Financial Resource Strain: Low Risk   . Difficulty of Paying Living Expenses: Not hard at all  Food Insecurity: No Food Insecurity  . Worried About Charity fundraiser in the Last Year: Never true  . Ran Out of Food in the Last Year: Never true  Transportation Needs: No Transportation Needs  . Lack of Transportation (Medical): No  . Lack of Transportation (Non-Medical): No  Physical Activity: Inactive  . Days of Exercise per Week: 0 days  . Minutes of Exercise per Session: 0 min  Stress: No Stress Concern Present  . Feeling of Stress : Not at all  Social Connections: Socially Isolated  . Frequency of Communication with Friends and Family: More than three times a week   . Frequency of Social Gatherings with Friends and Family: Never  . Attends Religious Services: Never  . Active Member of Clubs or Organizations: No  . Attends Archivist Meetings: Never  . Marital Status: Divorced  Human resources officer Violence: Not At Risk  . Fear of Current or Ex-Partner: No  . Emotionally Abused: No  . Physically Abused: No  . Sexually Abused: No    FAMILY HISTORY:  Family History  Problem Relation Age of Onset  . Alzheimer's disease Mother   . COPD Father   . Emphysema Father   . Hypertension Father   . Healthy Sister   . Healthy Brother   . Alzheimer's disease Maternal Grandmother   . Healthy Sister   . Healthy Sister   . Healthy Sister   . Prostate cancer Other        paternal grandmother's brother; dx in early 14s  . Breast cancer Neg Hx     CURRENT MEDICATIONS:  Current Outpatient Medications  Medication Sig Dispense Refill  . acetylcysteine (MUCOMYST) 20 % nebulizer solution Inhale 4 mLs into the lungs every 6 (six) hours as needed (COPD).    Marland Kitchen  albuterol (PROVENTIL HFA;VENTOLIN HFA) 108 (90 BASE) MCG/ACT inhaler Inhale 4-6 puffs into the lungs every 6 (six) hours as needed for wheezing or shortness of breath.    Marland Kitchen amLODipine (NORVASC) 5 MG tablet Take 5 mg by mouth at bedtime.     Marland Kitchen aspirin EC 81 MG tablet Take 81 mg by mouth daily.    Marland Kitchen CARBOPLATIN IV Inject into the vein every 21 ( twenty-one) days.    . ferrous sulfate 325 (65 FE) MG tablet Take 325 mg by mouth every other day.    . Fluticasone-Umeclidin-Vilant (TRELEGY ELLIPTA) 100-62.5-25 MCG/INH AEPB Inhale 1 puff into the lungs 2 (two) times daily.    Marland Kitchen gabapentin (NEURONTIN) 600 MG tablet Take 600 mg by mouth 2 (two) times daily.    Marland Kitchen ipratropium (ATROVENT HFA) 17 MCG/ACT inhaler Inhale 2 puffs into the lungs every 6 (six) hours as needed for wheezing.     Marland Kitchen ipratropium-albuterol (DUONEB) 0.5-2.5 (3) MG/3ML SOLN Take 0.5 mg by nebulization every 6 (six) hours as needed (COPD).    Marland Kitchen  levofloxacin (LEVAQUIN) 500 MG tablet Take 500 mg by mouth daily.    . magnesium oxide (MAG-OX) 400 (241.3 Mg) MG tablet Take 1 tablet (400 mg total) by mouth 2 (two) times daily. 60 tablet 3  . nortriptyline (PAMELOR) 25 MG capsule Take 25 mg by mouth at bedtime.    . ondansetron (ZOFRAN ODT) 4 MG disintegrating tablet 30m ODT q4 hours prn nausea/vomit (Patient taking differently: Take 4 mg by mouth every 4 (four) hours as needed for nausea or vomiting.) 12 tablet 0  . OXYGEN Inhale 3 L into the lungs continuous.     .Marland KitchenPACLITAXEL IV Inject into the vein every 21 ( twenty-one) days.    . pantoprazole (PROTONIX) 40 MG tablet Take 1 tablet (40 mg total) by mouth daily. 30 tablet 2  . predniSONE (DELTASONE) 5 MG tablet Take 5 mg by mouth daily with breakfast.    . simvastatin (ZOCOR) 40 MG tablet Take 40 mg by mouth at bedtime.     No current facility-administered medications for this visit.    ALLERGIES:  No Known Allergies  PHYSICAL EXAM:  Performance status (ECOG): 1 - Symptomatic but completely ambulatory  There were no vitals filed for this visit. Wt Readings from Last 3 Encounters:  03/21/21 113 lb 12.8 oz (51.6 kg)  03/07/21 114 lb 3.2 oz (51.8 kg)  03/02/21 114 lb (51.7 kg)   Physical Exam Vitals reviewed.  Constitutional:      Appearance: Normal appearance.  Cardiovascular:     Rate and Rhythm: Normal rate and regular rhythm.     Pulses: Normal pulses.     Heart sounds: Normal heart sounds.  Pulmonary:     Effort: Pulmonary effort is normal.     Breath sounds: Normal breath sounds.  Abdominal:     Palpations: Abdomen is soft. There is no hepatomegaly, splenomegaly or mass.     Tenderness: There is no abdominal tenderness.  Musculoskeletal:     Right lower leg: No edema.     Left lower leg: No edema.  Neurological:     General: No focal deficit present.     Mental Status: She is alert and oriented to person, place, and time.  Psychiatric:        Mood and Affect:  Mood normal.        Behavior: Behavior normal.     LABORATORY DATA:  I have reviewed the labs as listed.  CBC Latest Ref Rng & Units 03/21/2021 03/07/2021 03/06/2021  WBC 4.0 - 10.5 K/uL 4.1 6.0 5.1  Hemoglobin 12.0 - 15.0 g/dL 9.6(L) 8.7(L) 8.4(L)  Hematocrit 36.0 - 46.0 % 30.5(L) 27.8(L) 26.4(L)  Platelets 150 - 400 K/uL 196 115(L) 115(L)   CMP Latest Ref Rng & Units 03/21/2021 03/07/2021 02/17/2021  Glucose 70 - 99 mg/dL 97 105(H) 99  BUN 8 - 23 mg/dL '10 16 15  ' Creatinine 0.44 - 1.00 mg/dL 0.53 0.51 0.36(L)  Sodium 135 - 145 mmol/L 140 138 140  Potassium 3.5 - 5.1 mmol/L 3.5 3.6 4.0  Chloride 98 - 111 mmol/L 100 98 101  CO2 22 - 32 mmol/L 33(H) 29 30  Calcium 8.9 - 10.3 mg/dL 8.9 8.7(L) 8.7(L)  Total Protein 6.5 - 8.1 g/dL 6.7 6.8 6.1(L)  Total Bilirubin 0.3 - 1.2 mg/dL 0.4 0.6 0.6  Alkaline Phos 38 - 126 U/L 62 72 59  AST 15 - 41 U/L 20 18 12(L)  ALT 0 - 44 U/L '10 12 8    ' DIAGNOSTIC IMAGING:  I have independently reviewed the scans and discussed with the patient. No results found.   ASSESSMENT:  1. Peritoneal carcinomatosis: -Presentation to the ER with right lower quadrant abdominal pain on and off for 1 month. -CTAP on 05/29/2020 showed extensive nodularity throughout the omentum and upper peritoneal cavity. Both ovaries are prominent although no well-defined mass is noted.Small volume ascites. -CA-125 is elevated at 2407. CEA was 2.4. -CT of the chest shows 11 mm right paratracheal lymph node and 11 mm short axis precarinal lymph node. Subcarinal lymphadenopathy measuring 14 mm short axis. This is suspicious for metastatic disease. 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 -Cycle 1 of carboplatin and paclitaxel on 07/05/2020. -Evaluated by Dr. Denman George on 07/07/2020. Reevaluation after 3 cycles. -CT CAP from 09/01/2020  showed mild improvement with reduction in the omental caking. Stable to minimally reduced adenopathy in the chest and abdomen. New 4 mm right upper lobe lung nodule could be inflammatory. -CT CAP on 11/07/2019 and after 6 cycles showed improved peritoneal disease/omental caking. 9 mm short axis portacaval lymph node and adjacent upper abdominal lymph nodes measuring 12 mm in short axis. Small retroperitoneal lymph nodes up to 5 mm improved. Peritoneal disease/omental caking now measures 2.4 cm in thickness. -CA-125 on 10/18/2020 improved 52. -Plan for surgical resection after 6 cycles. Unfortunately she was not cleared for surgery by anesthesia/pulmonary. - CT CAP on 02/12/2021 showed peritoneal disease and abdominal lymph nodes have improved.  However new masslike areas in the left upper lobe, new since January 31 CT scan along with borderline enlargement of the left supraclavicular lymph node. - She underwent bronchoscopy and biopsy on 03/09/2021 which was benign.  2. COPD: -Quit smoking 6 years ago. Smoked 2 packs/day for 25-30 years. -Has been on3 L/min oxygen via nasal cannulafor the last 5 years.  3. Family history: -Paternal grandmother with colon cancer.   PLAN:  1.High-grade serous ovarian carcinoma: -She has tolerated last cycle reasonably well.  Reviewed labs today which showed white count 3.8 and ANC of 1.9.  Platelet count is slightly low at 125.  Hemoglobin is down to 8.5. - CA125 is 30.6. - We will proceed with dose reduced paclitaxel and carboplatin today.  RTC 3 weeks for follow-up.  We will plan to repeat CT CAP prior to next visit.   2. Family history: -Germline mutation testing  is negative.  3. Peripheral neuropathy: -Continue gabapentin 300 mg 3 times a day.  Neuropathy symptoms are stable.  4.Severe COPD: -Continue prednisone, Trelegy and Xopenex.  5. Hypomagnesemia: -Continue magnesium twice daily at home.  Magnesium today is 1.5.  She will  receive 2 g of IV magnesium.   Orders placed this encounter:  No orders of the defined types were placed in this encounter.    Derek Jack, MD [place] [phone]   I, Thana Ates, am acting as a scribe for Dr. Derek Jack.  I, Derek Jack MD, have reviewed the above documentation for accuracy and completeness, and I agree with the above.

## 2021-04-11 ENCOUNTER — Inpatient Hospital Stay (HOSPITAL_BASED_OUTPATIENT_CLINIC_OR_DEPARTMENT_OTHER): Payer: Medicare HMO | Admitting: Hematology

## 2021-04-11 ENCOUNTER — Other Ambulatory Visit: Payer: Self-pay

## 2021-04-11 ENCOUNTER — Inpatient Hospital Stay (HOSPITAL_COMMUNITY): Payer: Medicare HMO

## 2021-04-11 VITALS — BP 160/66 | HR 80 | Temp 96.8°F | Resp 18

## 2021-04-11 VITALS — BP 144/78 | HR 93 | Temp 97.2°F | Resp 20 | Wt 116.0 lb

## 2021-04-11 DIAGNOSIS — C786 Secondary malignant neoplasm of retroperitoneum and peritoneum: Secondary | ICD-10-CM

## 2021-04-11 DIAGNOSIS — Z5111 Encounter for antineoplastic chemotherapy: Secondary | ICD-10-CM | POA: Diagnosis not present

## 2021-04-11 DIAGNOSIS — C569 Malignant neoplasm of unspecified ovary: Secondary | ICD-10-CM

## 2021-04-11 DIAGNOSIS — Z95828 Presence of other vascular implants and grafts: Secondary | ICD-10-CM

## 2021-04-11 LAB — CBC WITH DIFFERENTIAL/PLATELET
Abs Immature Granulocytes: 0.04 10*3/uL (ref 0.00–0.07)
Basophils Absolute: 0 10*3/uL (ref 0.0–0.1)
Basophils Relative: 1 %
Eosinophils Absolute: 0 10*3/uL (ref 0.0–0.5)
Eosinophils Relative: 1 %
HCT: 26.8 % — ABNORMAL LOW (ref 36.0–46.0)
Hemoglobin: 8.5 g/dL — ABNORMAL LOW (ref 12.0–15.0)
Immature Granulocytes: 1 %
Lymphocytes Relative: 35 %
Lymphs Abs: 1.3 10*3/uL (ref 0.7–4.0)
MCH: 33.7 pg (ref 26.0–34.0)
MCHC: 31.7 g/dL (ref 30.0–36.0)
MCV: 106.3 fL — ABNORMAL HIGH (ref 80.0–100.0)
Monocytes Absolute: 0.5 10*3/uL (ref 0.1–1.0)
Monocytes Relative: 12 %
Neutro Abs: 1.9 10*3/uL (ref 1.7–7.7)
Neutrophils Relative %: 50 %
Platelets: 125 10*3/uL — ABNORMAL LOW (ref 150–400)
RBC: 2.52 MIL/uL — ABNORMAL LOW (ref 3.87–5.11)
RDW: 18.5 % — ABNORMAL HIGH (ref 11.5–15.5)
WBC: 3.8 10*3/uL — ABNORMAL LOW (ref 4.0–10.5)
nRBC: 0 % (ref 0.0–0.2)

## 2021-04-11 LAB — COMPREHENSIVE METABOLIC PANEL
ALT: 12 U/L (ref 0–44)
AST: 19 U/L (ref 15–41)
Albumin: 4 g/dL (ref 3.5–5.0)
Alkaline Phosphatase: 71 U/L (ref 38–126)
Anion gap: 8 (ref 5–15)
BUN: 11 mg/dL (ref 8–23)
CO2: 32 mmol/L (ref 22–32)
Calcium: 9.1 mg/dL (ref 8.9–10.3)
Chloride: 98 mmol/L (ref 98–111)
Creatinine, Ser: 0.47 mg/dL (ref 0.44–1.00)
GFR, Estimated: >60 mL/min (ref >60)
Glucose, Bld: 98 mg/dL (ref 70–99)
Potassium: 3.8 mmol/L (ref 3.5–5.1)
Sodium: 138 mmol/L (ref 135–145)
Total Bilirubin: 0.3 mg/dL (ref 0.3–1.2)
Total Protein: 6.6 g/dL (ref 6.5–8.1)

## 2021-04-11 LAB — MAGNESIUM: Magnesium: 1.5 mg/dL — ABNORMAL LOW (ref 1.7–2.4)

## 2021-04-11 MED ORDER — PACLITAXEL CHEMO INJECTION 300 MG/50ML
140.0000 mg/m2 | Freq: Once | INTRAVENOUS | Status: AC
  Administered 2021-04-11: 216 mg via INTRAVENOUS
  Filled 2021-04-11: qty 36

## 2021-04-11 MED ORDER — SODIUM CHLORIDE 0.9 % IV SOLN
449.0000 mg | Freq: Once | INTRAVENOUS | Status: AC
  Administered 2021-04-11: 450 mg via INTRAVENOUS
  Filled 2021-04-11: qty 45

## 2021-04-11 MED ORDER — DIPHENHYDRAMINE HCL 50 MG/ML IJ SOLN
50.0000 mg | Freq: Once | INTRAMUSCULAR | Status: AC
  Administered 2021-04-11: 50 mg via INTRAVENOUS

## 2021-04-11 MED ORDER — MAGNESIUM SULFATE 2 GM/50ML IV SOLN
2.0000 g | Freq: Once | INTRAVENOUS | Status: AC
  Administered 2021-04-11: 2 g via INTRAVENOUS

## 2021-04-11 MED ORDER — SODIUM CHLORIDE 0.9 % IV SOLN
10.0000 mg | Freq: Once | INTRAVENOUS | Status: AC
  Administered 2021-04-11: 10 mg via INTRAVENOUS
  Filled 2021-04-11: qty 10

## 2021-04-11 MED ORDER — DIPHENHYDRAMINE HCL 50 MG/ML IJ SOLN
INTRAMUSCULAR | Status: AC
  Filled 2021-04-11: qty 1

## 2021-04-11 MED ORDER — SODIUM CHLORIDE 0.9 % IV SOLN
150.0000 mg | Freq: Once | INTRAVENOUS | Status: AC
  Administered 2021-04-11: 150 mg via INTRAVENOUS
  Filled 2021-04-11: qty 150

## 2021-04-11 MED ORDER — PALONOSETRON HCL INJECTION 0.25 MG/5ML
0.2500 mg | Freq: Once | INTRAVENOUS | Status: AC
  Administered 2021-04-11: 0.25 mg via INTRAVENOUS

## 2021-04-11 MED ORDER — PALONOSETRON HCL INJECTION 0.25 MG/5ML
INTRAVENOUS | Status: AC
  Filled 2021-04-11: qty 5

## 2021-04-11 MED ORDER — SODIUM CHLORIDE 0.9% FLUSH
10.0000 mL | INTRAVENOUS | Status: DC | PRN
  Administered 2021-04-11: 10 mL

## 2021-04-11 MED ORDER — HEPARIN SOD (PORK) LOCK FLUSH 100 UNIT/ML IV SOLN
500.0000 [IU] | Freq: Once | INTRAVENOUS | Status: AC | PRN
  Administered 2021-04-11: 500 [IU]

## 2021-04-11 MED ORDER — FAMOTIDINE 20 MG IN NS 100 ML IVPB
20.0000 mg | Freq: Once | INTRAVENOUS | Status: AC
  Administered 2021-04-11: 20 mg via INTRAVENOUS
  Filled 2021-04-11: qty 2

## 2021-04-11 MED ORDER — MAGNESIUM SULFATE 2 GM/50ML IV SOLN
INTRAVENOUS | Status: AC
  Filled 2021-04-11: qty 50

## 2021-04-11 MED ORDER — SODIUM CHLORIDE 0.9 % IV SOLN: Freq: Once | INTRAVENOUS | Status: AC

## 2021-04-11 NOTE — Progress Notes (Signed)
Patient was assessed by [name] and labs have been reviewed.  Pending ANC.  If >1500 may proceed with treatment.  Mg low today and will give 2 grams IV with treatment today.  Primary RN and pharmacy aware.

## 2021-04-11 NOTE — Progress Notes (Signed)
Patient presents today for Taxol and Carboplatin and follow up visit with [name]. Vital signs within parameters for treatment. Magnesium 1.5 today. Message received from [place] / [name] to proceed with treatment if [place] is within parameters. Message received to give 2 Grams Magnesium Sulfate IV with tx.   Treatment given today per MD orders. Tolerated infusion without adverse affects. Vital signs stable. No complaints at this time. Discharged from clinic via wheel chair in stable condition. Alert and oriented x 3. F/U with [facility] as scheduled.

## 2021-04-11 NOTE — Patient Instructions (Signed)
[facility]  Discharge Instructions: Thank you for choosing [facility] to provide your oncology and hematology care.  If you have a lab appointment with the Cancer Center, please come in thru the Main Entrance and check in at the main information desk.  Wear comfortable clothing and clothing appropriate for easy access to any Portacath or PICC line.   We strive to give you quality time with your provider. You may need to reschedule your appointment if you arrive late (15 or more minutes).  Arriving late affects you and other patients whose appointments are after yours.  Also, if you miss three or more appointments without notifying the office, you may be dismissed from the clinic at the provider's discretion.      For prescription refill requests, have your pharmacy contact our office and allow 72 hours for refills to be completed.    Today you received the following chemotherapy and/or immunotherapy agents Taxol and Carboplatin.   To help prevent nausea and vomiting after your treatment, we encourage you to take your nausea medication as directed.  BELOW ARE SYMPTOMS THAT SHOULD BE REPORTED IMMEDIATELY: . *FEVER GREATER THAN 100.4 F (38 C) OR HIGHER . *CHILLS OR SWEATING . *NAUSEA AND VOMITING THAT IS NOT CONTROLLED WITH YOUR NAUSEA MEDICATION . *UNUSUAL SHORTNESS OF BREATH . *UNUSUAL BRUISING OR BLEEDING . *URINARY PROBLEMS (pain or burning when urinating, or frequent urination) . *BOWEL PROBLEMS (unusual diarrhea, constipation, pain near the anus) . TENDERNESS IN MOUTH AND THROAT WITH OR WITHOUT PRESENCE OF ULCERS (sore throat, sores in mouth, or a toothache) . UNUSUAL RASH, SWELLING OR PAIN  . UNUSUAL VAGINAL DISCHARGE OR ITCHING   Items with * indicate a potential emergency and should be followed up as soon as possible or go to the Emergency Department if any problems should occur.  Please show the CHEMOTHERAPY ALERT CARD or IMMUNOTHERAPY ALERT CARD at  check-in to the Emergency Department and triage nurse.  Should you have questions after your visit or need to cancel or reschedule your appointment, please contact [facility] [phone]  and follow the prompts.  Office hours are 8:00 a.m. to 4:30 p.m. Monday - Friday. Please note that voicemails left after 4:00 p.m. may not be returned until the following business day.  We are closed weekends and major holidays. You have access to a nurse at all times for urgent questions. Please call the main number to the clinic [phone] and follow the prompts.  For any non-urgent questions, you may also contact your provider using MyChart. We now offer e-Visits for anyone 18 and older to request care online for non-urgent symptoms. For details visit [email or web address].   Also download the MyChart app! Go to the app store, search "MyChart", open the app, select [facility], and log in with your MyChart username and password.  Due to Covid, a mask is required upon entering the hospital/clinic. If you do not have a mask, one will be given to you upon arrival. For doctor visits, patients may have 1 support person aged 18 or older with them. For treatment visits, patients cannot have anyone with them due to current Covid guidelines and our immunocompromised population.  

## 2021-04-11 NOTE — Patient Instructions (Addendum)
[place] at [facility] Discharge Instructions  You were seen today by [name]. He went over your recent results, and you received treatment. You will be scheduled for a CT scan of your chest and abdomen prior to your next visit. [name] will see you back in 3 weeks for labs and follow up.   Thank you for choosing [place] at [facility] to provide your oncology and hematology care.  To afford each patient quality time with our provider, please arrive at least 15 minutes before your scheduled appointment time.   If you have a lab appointment with the [facility] please come in thru the Main Entrance and check in at the main information desk  You need to re-schedule your appointment should you arrive 10 or more minutes late.  We strive to give you quality time with our providers, and arriving late affects you and other patients whose appointments are after yours.  Also, if you no show three or more times for appointments you may be dismissed from the clinic at the providers discretion.     Again, thank you for choosing [facility].  Our hope is that these requests will decrease the amount of time that you wait before being seen by our physicians.       _____________________________________________________________  Should you have questions after your visit to [facility], please contact our office at [phone] between the hours of 8:00 a.m. and 4:30 p.m.  Voicemails left after 4:00 p.m. will not be returned until the following business day.  For prescription refill requests, have your pharmacy contact our office and allow 72 hours.    Cancer Center Support Programs:   > Cancer Support Group  2nd Tuesday of the month 1pm-2pm, Journey Room

## 2021-04-12 LAB — CA 125: Cancer Antigen (CA) 125: 30.6 U/mL (ref 0.0–38.1)

## 2021-04-13 ENCOUNTER — Inpatient Hospital Stay (HOSPITAL_COMMUNITY): Payer: Medicare HMO

## 2021-04-13 ENCOUNTER — Other Ambulatory Visit: Payer: Self-pay

## 2021-04-13 VITALS — BP 108/63 | HR 98 | Temp 96.7°F | Resp 18

## 2021-04-13 DIAGNOSIS — Z5111 Encounter for antineoplastic chemotherapy: Secondary | ICD-10-CM | POA: Diagnosis not present

## 2021-04-13 DIAGNOSIS — Z95828 Presence of other vascular implants and grafts: Secondary | ICD-10-CM

## 2021-04-13 DIAGNOSIS — C786 Secondary malignant neoplasm of retroperitoneum and peritoneum: Secondary | ICD-10-CM

## 2021-04-13 MED ORDER — PEGFILGRASTIM-CBQV 6 MG/0.6ML ~~LOC~~ SOSY
6.0000 mg | PREFILLED_SYRINGE | Freq: Once | SUBCUTANEOUS | Status: AC
  Administered 2021-04-13: 6 mg via SUBCUTANEOUS

## 2021-04-13 MED ORDER — PEGFILGRASTIM-CBQV 6 MG/0.6ML ~~LOC~~ SOSY
PREFILLED_SYRINGE | SUBCUTANEOUS | Status: AC
  Filled 2021-04-13: qty 0.6

## 2021-04-13 NOTE — Progress Notes (Signed)
[name] presents today for Udenyca injection per the provider's orders.  Stable during administration without incident; injection site WNL; see MAR for injection details.  Patient tolerated procedure well and without incident.  No questions or complaints noted at this time. Discharge from clinic ambulatory in stable condition.  Alert and oriented X 3.  Follow up with [facility] as scheduled.

## 2021-04-13 NOTE — Patient Instructions (Signed)
[name]  Discharge Instructions: Thank you for choosing [facility] to provide your oncology and hematology care.  If you have a lab appointment with the [facility], please come in thru the Main Entrance and check in at the main information desk.  Wear comfortable clothing and clothing appropriate for easy access to any Portacath or PICC line.   We strive to give you quality time with your provider. You may need to reschedule your appointment if you arrive late (15 or more minutes).  Arriving late affects you and other patients whose appointments are after yours.  Also, if you miss three or more appointments without notifying the office, you may be dismissed from the clinic at the provider's discretion.      For prescription refill requests, have your pharmacy contact our office and allow 72 hours for refills to be completed.    Today you received the following chemotherapy and/or immunotherapy agents Udenacya     To help prevent nausea and vomiting after your treatment, we encourage you to take your nausea medication as directed.  BELOW ARE SYMPTOMS THAT SHOULD BE REPORTED IMMEDIATELY: . *FEVER GREATER THAN 100.4 F (38 C) OR HIGHER . *CHILLS OR SWEATING . *NAUSEA AND VOMITING THAT IS NOT CONTROLLED WITH YOUR NAUSEA MEDICATION . *UNUSUAL SHORTNESS OF BREATH . *UNUSUAL BRUISING OR BLEEDING . *URINARY PROBLEMS (pain or burning when urinating, or frequent urination) . *BOWEL PROBLEMS (unusual diarrhea, constipation, pain near the anus) . TENDERNESS IN MOUTH AND THROAT WITH OR WITHOUT PRESENCE OF ULCERS (sore throat, sores in mouth, or a toothache) . UNUSUAL RASH, SWELLING OR PAIN  . UNUSUAL VAGINAL DISCHARGE OR ITCHING   Items with * indicate a potential emergency and should be followed up as soon as possible or go to the Emergency Department if any problems should occur.  Please show the CHEMOTHERAPY ALERT CARD or IMMUNOTHERAPY ALERT CARD at check-in to  the Emergency Department and triage nurse.  Should you have questions after your visit or need to cancel or reschedule your appointment, please contact [facility] [phone]  and follow the prompts.  Office hours are 8:00 a.m. to 4:30 p.m. Monday - Friday. Please note that voicemails left after 4:00 p.m. may not be returned until the following business day.  We are closed weekends and major holidays. You have access to a nurse at all times for urgent questions. Please call the main number to the clinic [phone] and follow the prompts.  For any non-urgent questions, you may also contact your provider using MyChart. We now offer e-Visits for anyone 42 and older to request care online for non-urgent symptoms. For details visit [email or web address].   Also download the MyChart app! Go to the app store, search "MyChart", open the app, select [facility], and log in with your MyChart username and password.  Due to Covid, a mask is required upon entering the hospital/clinic. If you do not have a mask, one will be given to you upon arrival. For doctor visits, patients may have 1 support person aged 78 or older with them. For treatment visits, patients cannot have anyone with them due to current Covid guidelines and our immunocompromised population.

## 2021-04-16 ENCOUNTER — Encounter (HOSPITAL_COMMUNITY): Payer: Self-pay | Admitting: Hematology

## 2021-04-23 ENCOUNTER — Ambulatory Visit (HOSPITAL_BASED_OUTPATIENT_CLINIC_OR_DEPARTMENT_OTHER): Payer: Medicare HMO

## 2021-04-25 ENCOUNTER — Other Ambulatory Visit: Payer: Self-pay | Admitting: Hematology

## 2021-04-25 DIAGNOSIS — C786 Secondary malignant neoplasm of retroperitoneum and peritoneum: Secondary | ICD-10-CM

## 2021-04-25 DIAGNOSIS — C569 Malignant neoplasm of unspecified ovary: Secondary | ICD-10-CM

## 2021-04-27 ENCOUNTER — Ambulatory Visit (HOSPITAL_BASED_OUTPATIENT_CLINIC_OR_DEPARTMENT_OTHER): Payer: Medicare HMO

## 2021-05-01 NOTE — Progress Notes (Signed)
[place], [place] [address]   CLINIC:  Medical Oncology/Hematology  PCP:  [name], [place] / [place] [address] [phone]   REASON FOR VISIT:  Follow-up for high-grade serous ovarian carcinoma  PRIOR THERAPY: none  NGS Results: BRCA 1/2 negative  CURRENT THERAPY: Carboplatin, paclitaxel & Aloxi every 3 weeks  BRIEF ONCOLOGIC HISTORY:  Oncology History  Peritoneal carcinomatosis ([place])  06/06/2020 Initial Diagnosis   Peritoneal carcinomatosis ([place])    07/05/2020 -  Chemotherapy    Patient is on Treatment Plan: OVARIAN CARBOPLATIN (AUC 6) / PACLITAXEL (175) Q21D X 6 CYCLES       07/22/2020 Genetic Testing   Negative genetic testing: no pathogenic variants detected in [facility] Expanded CancerNext Panel + RNAInsight. The CancerNext-Expanded gene panel offered by [facility] and includes sequencing and rearrangement analysis for the following 77 genes: AIP, ALK, APC*, ATM*, AXIN2, BAP1, BARD1, BLM, BMPR1A, BRCA1*, BRCA2*, BRIP1*, CDC73, CDH1*, CDK4, CDKN1B, CDKN2A, CHEK2*, CTNNA1, DICER1, FANCC, FH, FLCN, GALNT12, KIF1B, LZTR1, MAX, MEN1, MET, MLH1*, MSH2*, MSH3, MSH6*, MUTYH*, NBN, NF1*, NF2, NTHL1, PALB2*, PHOX2B, PMS2*, POT1, PRKAR1A, PTCH1, PTEN*, RAD51C*, RAD51D*, RB1, RECQL, RET, SDHA, SDHAF2, SDHB, SDHC, SDHD, SMAD4, SMARCA4, SMARCB1, SMARCE1, STK11, SUFU, TMEM127, TP53*, TSC1, TSC2, VHL and XRCC2 (sequencing and deletion/duplication); EGFR, EGLN1, HOXB13, KIT, MITF, PDGFRA, POLD1, and POLE (sequencing only); EPCAM and GREM1 (deletion/duplication only). DNA and RNA analyses performed for * genes. The report date is July 22, 2020.      CANCER STAGING: Cancer Staging No matching staging information was found for the patient.  INTERVAL HISTORY:  Ms. AVAH BASHOR, a 65 y.o. female, returns for routine follow-up and consideration for next cycle of chemotherapy. Tyler was last seen on 04/11/2021.  Due for  cycle #13 of  Carboplatin/Taxol  today.   Overall, she tells me she has been feeling pretty well. She is currently taking magnesium. She denies n/v/d. She denies tingling and numbness. Scheduled for a CT this Friday  Overall, she feels ready for next cycle of chemo today.   REVIEW OF SYSTEMS:  Review of Systems  Constitutional:  Positive for appetite change (75%) and fatigue (50%).  Gastrointestinal:  Negative for diarrhea, nausea and vomiting.  Neurological:  Negative for numbness.  All other systems reviewed and are negative.  PAST MEDICAL/SURGICAL HISTORY:  Past Medical History:  Diagnosis Date   Anemia    Aortic atherosclerosis (HCC)    Arthritis    Asthma    Bilateral carotid artery stenosis    Cancer (HCC)    Gastric Cancer   COPD (chronic obstructive pulmonary disease) (HCC)    High cholesterol    Hyperlipidemia    Hypertension    Neuropathy    Osteoarthritis    Ovarian cancer ([place])    Oxygen dependent    Peripheral vascular disease ([place])    Peritoneal carcinoma ([place])    Pneumonia 2019   Port-A-Cath in place 07/03/2020   Pulmonary emphysema ([place])    Past Surgical History:  Procedure Laterality Date   ESOPHAGOGASTRODUODENOSCOPY (EGD) WITH PROPOFOL N/A 02/17/2021   Procedure: ESOPHAGOGASTRODUODENOSCOPY (EGD) WITH PROPOFOL;  Surgeon: Harvel Quale, MD;  [facility];  Service: Gastroenterology;  Laterality: N/A;   HALLUX VALGUS BASE WEDGE Right 06/09/2015   Procedure: Base wedge osteotomy with modified McBride right foot ;  Surgeon: Sharlotte Alamo, MD;  Location: [facility] ORS;  Service: Podiatry;  Laterality: Right;   PORTACATH PLACEMENT Left 06/28/2020  Procedure: PORT-A-CATHETER PLACEMENT LEFT CHEST (attached catheter in left subclavian);  Surgeon: Virl Cagey, MD;  Location: [facility] ORS;  Service: General;  Laterality: Left;   TUBAL LIGATION     VIDEO BRONCHOSCOPY WITH ENDOBRONCHIAL NAVIGATION N/A 03/09/2021   Procedure: VIDEO BRONCHOSCOPY WITH  ENDOBRONCHIAL NAVIGATION;  Surgeon: Ottie Glazier, MD;  Location: [facility] ORS;  Service: Thoracic;  Laterality: N/A;   VIDEO BRONCHOSCOPY WITH ENDOBRONCHIAL ULTRASOUND N/A 03/09/2021   Procedure: VIDEO BRONCHOSCOPY WITH ENDOBRONCHIAL ULTRASOUND;  Surgeon: Ottie Glazier, MD;  Location: [facility] ORS;  Service: Thoracic;  Laterality: N/A;    SOCIAL HISTORY:  Social History   Socioeconomic History   Marital status: Divorced    Spouse name: Not on file   Number of children: 3   Years of education: Not on file   Highest education level: Not on file  Occupational History   Occupation: DISABLED  Tobacco Use   Smoking status: Former    Packs/day: 2.00    Years: 30.00    Pack years: 60.00    Types: Cigarettes    Quit date: 11/04/2013    Years since quitting: 7.4   Smokeless tobacco: Never  Vaping Use   Vaping Use: Never used  Substance and Sexual Activity   Alcohol use: No   Drug use: No   Sexual activity: Not Currently  Other Topics Concern   Not on file  Social History Narrative   Not on file   Social Determinants of Health   Financial Resource Strain: Low Risk    Difficulty of Paying Living Expenses: Not hard at all  Food Insecurity: No Food Insecurity   Worried About Charity fundraiser in the Last Year: Never true   [place] in the Last Year: Never true  Transportation Needs: No Transportation Needs   Lack of Transportation (Medical): No   Lack of Transportation (Non-Medical): No  Physical Activity: Inactive   Days of Exercise per Week: 0 days   Minutes of Exercise per Session: 0 min  Stress: No Stress Concern Present   Feeling of Stress : Not at all  Social Connections: Socially Isolated   Frequency of Communication with Friends and Family: More than three times a week   Frequency of Social Gatherings with Friends and Family: Never   Attends Religious Services: Never   Marine scientist or Organizations: No   Attends Music therapist: Never    Marital Status: Divorced  Human resources officer Violence: Not At Risk   Fear of Current or Ex-Partner: No   Emotionally Abused: No   Physically Abused: No   Sexually Abused: No    FAMILY HISTORY:  Family History  Problem Relation Age of Onset   Alzheimer's disease Mother    COPD Father    Emphysema Father    Hypertension Father    Healthy Sister    Healthy Brother    Alzheimer's disease Maternal Grandmother    Healthy Sister    Healthy Sister    Healthy Sister    Prostate cancer Other        paternal grandmother's brother; dx in early 66s   Breast cancer Neg Hx     CURRENT MEDICATIONS:  Current Outpatient Medications  Medication Sig Dispense Refill   acetylcysteine (MUCOMYST) 20 % nebulizer solution Inhale 4 mLs into the lungs every 6 (six) hours as needed (COPD).     albuterol (PROVENTIL HFA;VENTOLIN HFA) 108 (90 BASE) MCG/ACT inhaler Inhale 4-6 puffs into the lungs every 6 (six)  hours as needed for wheezing or shortness of breath.     amLODipine (NORVASC) 5 MG tablet Take 5 mg by mouth at bedtime.      aspirin EC 81 MG tablet Take 81 mg by mouth daily.     CARBOPLATIN IV Inject into the vein every 21 ( twenty-one) days.     ferrous sulfate 325 (65 FE) MG tablet Take 325 mg by mouth every other day.     Fluticasone-Umeclidin-Vilant (TRELEGY ELLIPTA) 100-62.5-25 MCG/INH AEPB Inhale 1 puff into the lungs 2 (two) times daily.     gabapentin (NEURONTIN) 600 MG tablet Take 600 mg by mouth 2 (two) times daily.     ipratropium (ATROVENT HFA) 17 MCG/ACT inhaler Inhale 2 puffs into the lungs every 6 (six) hours as needed for wheezing.      ipratropium-albuterol (DUONEB) 0.5-2.5 (3) MG/3ML SOLN Take 0.5 mg by nebulization every 6 (six) hours as needed (COPD).     levofloxacin (LEVAQUIN) 500 MG tablet Take 500 mg by mouth daily.     magnesium oxide (MAG-OX) 400 (241.3 Mg) MG tablet Take 1 tablet (400 mg total) by mouth 2 (two) times daily. 60 tablet 3   nortriptyline (PAMELOR) 25 MG capsule  Take 25 mg by mouth at bedtime.     ondansetron (ZOFRAN ODT) 4 MG disintegrating tablet 68m ODT q4 hours prn nausea/vomit (Patient taking differently: Take 4 mg by mouth every 4 (four) hours as needed for nausea or vomiting.) 12 tablet 0   OXYGEN Inhale 3 L into the lungs continuous.      PACLITAXEL IV Inject into the vein every 21 ( twenty-one) days.     pantoprazole (PROTONIX) 40 MG tablet Take 1 tablet (40 mg total) by mouth daily. 30 tablet 2   predniSONE (DELTASONE) 5 MG tablet Take 5 mg by mouth daily with breakfast.     simvastatin (ZOCOR) 40 MG tablet Take 40 mg by mouth at bedtime.     No current facility-administered medications for this visit.    ALLERGIES:  No Known Allergies  PHYSICAL EXAM:  Performance status (ECOG): 1 - Symptomatic but completely ambulatory  There were no vitals filed for this visit. Wt Readings from Last 3 Encounters:  04/11/21 116 lb (52.6 kg)  03/21/21 113 lb 12.8 oz (51.6 kg)  03/07/21 114 lb 3.2 oz (51.8 kg)   Physical Exam Vitals reviewed.  Constitutional:      Appearance: Normal appearance.  Cardiovascular:     Rate and Rhythm: Normal rate and regular rhythm.     Pulses: Normal pulses.     Heart sounds: Normal heart sounds.  Pulmonary:     Effort: Pulmonary effort is normal.     Breath sounds: Normal breath sounds.  Abdominal:     Palpations: Abdomen is soft. There is no hepatomegaly, splenomegaly or mass.     Tenderness: There is no abdominal tenderness.  Musculoskeletal:     Right lower leg: No edema.     Left lower leg: No edema.  Neurological:     General: No focal deficit present.     Mental Status: She is alert and oriented to person, place, and time.  Psychiatric:        Mood and Affect: Mood normal.        Behavior: Behavior normal.    LABORATORY DATA:  I have reviewed the labs as listed.  CBC Latest Ref Rng & Units 04/11/2021 03/21/2021 03/07/2021  WBC 4.0 - 10.5 K/uL 3.8(L) 4.1 6.0  Hemoglobin  12.0 - 15.0 g/dL 8.5(L)  9.6(L) 8.7(L)  Hematocrit 36.0 - 46.0 % 26.8(L) 30.5(L) 27.8(L)  Platelets 150 - 400 K/uL 125(L) 196 115(L)   CMP Latest Ref Rng & Units 04/11/2021 03/21/2021 03/07/2021  Glucose 70 - 99 mg/dL 98 97 105(H)  BUN 8 - 23 mg/dL _0 Creatinine 0.44 - 1.00 mg/dL 0.47 0.53 0.51  Sodium 135 - 145 mmol/L 138 140 138  Potassium 3.5 - 5.1 mmol/L 3.8 3.5 3.6  Chloride 98 - 111 mmol/L 98 100 98  CO2 22 - 32 mmol/L 32 33(H) 29  Calcium 8.9 - 10.3 mg/dL 9.1 8.9 8.7(L)  Total Protein 6.5 - 8.1 g/dL 6.6 6.7 6.8  Total Bilirubin 0.3 - 1.2 mg/dL 0.3 0.4 0.6  Alkaline Phos 38 - 126 U/L 71 62 72  AST 15 - 41 U/L _1 ALT 0 - 44 U/L _2 DIAGNOSTIC IMAGING:  I have independently reviewed the scans and discussed with the patient. No results found.   ASSESSMENT:  1.  Peritoneal carcinomatosis: -Presentation to the ER with right lower quadrant abdominal pain on and off for 1 month. -CTAP on 05/29/2020 showed extensive nodularity throughout the omentum and upper peritoneal cavity.  Both ovaries are prominent although no well-defined mass is noted.  Small volume ascites. -CA-125 is elevated at 2407.  CEA was 2.4. -CT of the chest shows 11 mm right paratracheal lymph node and 11 mm short axis precarinal lymph node.  Subcarinal lymphadenopathy measuring 14 mm short axis.  This is suspicious for metastatic disease. 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 -Cycle 1 of carboplatin and paclitaxel on 07/05/2020. -Evaluated by Dr. Denman George on 07/07/2020.  Reevaluation after 3 cycles. -CT CAP from 09/01/2020 showed mild improvement with reduction in the omental caking.  Stable to minimally reduced adenopathy in the chest and abdomen.  New 4 mm right upper lobe lung nodule could be inflammatory. -CT CAP on 11/07/2019 and after 6 cycles showed improved  peritoneal disease/omental caking.  9 mm short axis portacaval lymph node and adjacent upper abdominal lymph nodes measuring 12 mm in short axis.  Small retroperitoneal lymph nodes up to 5 mm improved.  Peritoneal disease/omental caking now measures 2.4 cm in thickness. -CA-125 on 10/18/2020 improved 52. -Plan for surgical resection after 6 cycles.  Unfortunately she was not cleared for surgery by anesthesia/pulmonary. - CT CAP on 02/12/2021 showed peritoneal disease and abdominal lymph nodes have improved.  However new masslike areas in the left upper lobe, new since January 31 CT scan along with borderline enlargement of the left supraclavicular lymph node. - She underwent bronchoscopy and biopsy on 03/09/2021 which was benign.   2.  COPD: -Quit smoking 6 years ago.  Smoked 2 packs/day for 25-30 years. -Has been on 3 L/min oxygen via nasal cannula for the last 5 years.   3.  Family history: -Paternal grandmother with colon cancer.   PLAN:  1.  High-grade serous ovarian carcinoma: - She has tolerated last cycle reasonably well.  She gained couple of pounds and eating better. - CBC shows platelet count 135 with ANC of 2000. - She has macrocytic anemia from myelosuppression.  LFTs are normal. - Proceed with dose reduced carboplatin and paclitaxel today. - She will proceed with her treatment today. - RTC 3 weeks with CT scan, labs and treatment.   2.  Family history: -  Germline mutation testing was negative.   3.  Peripheral neuropathy: - Continue gabapentin 300 mg 3 times daily.  Neuropathy symptoms are stable.   4.  Severe COPD: - Continue prednisone, Trelegy and Xopenex.   5.  Hypomagnesemia: - Continue magnesium twice daily.  Last magnesium was 1.5.   Orders placed this encounter:  No orders of the defined types were placed in this encounter.    Derek Jack, MD [place] [phone]   I, Thana Ates, am acting as a scribe for Dr. Derek Jack.  I, Derek Jack MD, have reviewed the above documentation for accuracy and completeness, and I agree with the above.

## 2021-05-02 ENCOUNTER — Inpatient Hospital Stay (HOSPITAL_BASED_OUTPATIENT_CLINIC_OR_DEPARTMENT_OTHER): Payer: Medicare HMO | Admitting: Hematology

## 2021-05-02 ENCOUNTER — Inpatient Hospital Stay (HOSPITAL_COMMUNITY): Payer: Medicare HMO | Attending: Hematology

## 2021-05-02 ENCOUNTER — Inpatient Hospital Stay (HOSPITAL_COMMUNITY): Payer: Medicare HMO

## 2021-05-02 ENCOUNTER — Other Ambulatory Visit: Payer: Self-pay

## 2021-05-02 VITALS — BP 116/73 | HR 87 | Temp 98.0°F | Resp 18

## 2021-05-02 VITALS — BP 136/72 | HR 90 | Temp 96.8°F | Resp 18 | Wt 118.0 lb

## 2021-05-02 DIAGNOSIS — C786 Secondary malignant neoplasm of retroperitoneum and peritoneum: Secondary | ICD-10-CM

## 2021-05-02 DIAGNOSIS — C569 Malignant neoplasm of unspecified ovary: Secondary | ICD-10-CM | POA: Insufficient documentation

## 2021-05-02 DIAGNOSIS — Z5189 Encounter for other specified aftercare: Secondary | ICD-10-CM | POA: Insufficient documentation

## 2021-05-02 DIAGNOSIS — Z79899 Other long term (current) drug therapy: Secondary | ICD-10-CM | POA: Insufficient documentation

## 2021-05-02 DIAGNOSIS — Z95828 Presence of other vascular implants and grafts: Secondary | ICD-10-CM

## 2021-05-02 DIAGNOSIS — I10 Essential (primary) hypertension: Secondary | ICD-10-CM | POA: Insufficient documentation

## 2021-05-02 DIAGNOSIS — Z87891 Personal history of nicotine dependence: Secondary | ICD-10-CM | POA: Diagnosis not present

## 2021-05-02 DIAGNOSIS — Z800 Family history of malignant neoplasm of digestive organs: Secondary | ICD-10-CM | POA: Insufficient documentation

## 2021-05-02 DIAGNOSIS — Z5111 Encounter for antineoplastic chemotherapy: Secondary | ICD-10-CM | POA: Insufficient documentation

## 2021-05-02 LAB — COMPREHENSIVE METABOLIC PANEL
ALT: 13 U/L (ref 0–44)
AST: 21 U/L (ref 15–41)
Albumin: 3.9 g/dL (ref 3.5–5.0)
Alkaline Phosphatase: 72 U/L (ref 38–126)
Anion gap: 6 (ref 5–15)
BUN: 9 mg/dL (ref 8–23)
CO2: 35 mmol/L — ABNORMAL HIGH (ref 22–32)
Calcium: 8.4 mg/dL — ABNORMAL LOW (ref 8.9–10.3)
Chloride: 99 mmol/L (ref 98–111)
Creatinine, Ser: 0.49 mg/dL (ref 0.44–1.00)
GFR, Estimated: >60 mL/min (ref >60)
Glucose, Bld: 95 mg/dL (ref 70–99)
Potassium: 3.7 mmol/L (ref 3.5–5.1)
Sodium: 140 mmol/L (ref 135–145)
Total Bilirubin: 0.2 mg/dL — ABNORMAL LOW (ref 0.3–1.2)
Total Protein: 6.5 g/dL (ref 6.5–8.1)

## 2021-05-02 LAB — CBC WITH DIFFERENTIAL/PLATELET
Abs Immature Granulocytes: 0.01 10*3/uL (ref 0.00–0.07)
Basophils Absolute: 0 10*3/uL (ref 0.0–0.1)
Basophils Relative: 1 %
Eosinophils Absolute: 0 10*3/uL (ref 0.0–0.5)
Eosinophils Relative: 1 %
HCT: 26.7 % — ABNORMAL LOW (ref 36.0–46.0)
Hemoglobin: 8.3 g/dL — ABNORMAL LOW (ref 12.0–15.0)
Immature Granulocytes: 0 %
Lymphocytes Relative: 36 %
Lymphs Abs: 1.4 10*3/uL (ref 0.7–4.0)
MCH: 33.7 pg (ref 26.0–34.0)
MCHC: 31.1 g/dL (ref 30.0–36.0)
MCV: 108.5 fL — ABNORMAL HIGH (ref 80.0–100.0)
Monocytes Absolute: 0.5 10*3/uL (ref 0.1–1.0)
Monocytes Relative: 13 %
Neutro Abs: 2 10*3/uL (ref 1.7–7.7)
Neutrophils Relative %: 49 %
Platelets: 135 10*3/uL — ABNORMAL LOW (ref 150–400)
RBC: 2.46 MIL/uL — ABNORMAL LOW (ref 3.87–5.11)
RDW: 18.1 % — ABNORMAL HIGH (ref 11.5–15.5)
WBC: 4 10*3/uL (ref 4.0–10.5)
nRBC: 0 % (ref 0.0–0.2)

## 2021-05-02 MED ORDER — PALONOSETRON HCL INJECTION 0.25 MG/5ML
INTRAVENOUS | Status: AC
  Filled 2021-05-02: qty 5

## 2021-05-02 MED ORDER — SODIUM CHLORIDE 0.9 % IV SOLN
449.0000 mg | Freq: Once | INTRAVENOUS | Status: AC
  Administered 2021-05-02: 450 mg via INTRAVENOUS
  Filled 2021-05-02: qty 45

## 2021-05-02 MED ORDER — PACLITAXEL CHEMO INJECTION 300 MG/50ML
140.0000 mg/m2 | Freq: Once | INTRAVENOUS | Status: AC
  Administered 2021-05-02: 216 mg via INTRAVENOUS
  Filled 2021-05-02: qty 36

## 2021-05-02 MED ORDER — SODIUM CHLORIDE 0.9 % IV SOLN
150.0000 mg | Freq: Once | INTRAVENOUS | Status: AC
  Administered 2021-05-02: 150 mg via INTRAVENOUS
  Filled 2021-05-02: qty 150

## 2021-05-02 MED ORDER — SODIUM CHLORIDE 0.9% FLUSH
10.0000 mL | INTRAVENOUS | Status: DC | PRN
  Administered 2021-05-02: 10 mL

## 2021-05-02 MED ORDER — DIPHENHYDRAMINE HCL 50 MG/ML IJ SOLN
INTRAMUSCULAR | Status: AC
  Filled 2021-05-02: qty 1

## 2021-05-02 MED ORDER — PALONOSETRON HCL INJECTION 0.25 MG/5ML
0.2500 mg | Freq: Once | INTRAVENOUS | Status: AC
Start: 2021-05-02 — End: 2021-05-02
  Administered 2021-05-02: 0.25 mg via INTRAVENOUS

## 2021-05-02 MED ORDER — SODIUM CHLORIDE 0.9 % IV SOLN
10.0000 mg | Freq: Once | INTRAVENOUS | Status: AC
  Administered 2021-05-02: 10 mg via INTRAVENOUS
  Filled 2021-05-02: qty 10

## 2021-05-02 MED ORDER — SODIUM CHLORIDE 0.9 % IV SOLN: Freq: Once | INTRAVENOUS | Status: AC

## 2021-05-02 MED ORDER — HEPARIN SOD (PORK) LOCK FLUSH 100 UNIT/ML IV SOLN
500.0000 [IU] | Freq: Once | INTRAVENOUS | Status: AC | PRN
  Administered 2021-05-02: 500 [IU]

## 2021-05-02 MED ORDER — FAMOTIDINE 20 MG IN NS 100 ML IVPB
20.0000 mg | Freq: Once | INTRAVENOUS | Status: AC
Start: 2021-05-02 — End: 2021-05-02
  Administered 2021-05-02: 20 mg via INTRAVENOUS
  Filled 2021-05-02: qty 100

## 2021-05-02 MED ORDER — DIPHENHYDRAMINE HCL 50 MG/ML IJ SOLN
50.0000 mg | Freq: Once | INTRAMUSCULAR | Status: AC
  Administered 2021-05-02: 50 mg via INTRAVENOUS

## 2021-05-02 NOTE — Patient Instructions (Signed)
[place]  Discharge Instructions: Thank you for choosing [facility] to provide your oncology and hematology care.  If you have a lab appointment with the [facility], please come in thru the Main Entrance and check in at the main information desk.  Wear comfortable clothing and clothing appropriate for easy access to any Portacath or PICC line.   We strive to give you quality time with your provider. You may need to reschedule your appointment if you arrive late (15 or more minutes).  Arriving late affects you and other patients whose appointments are after yours.  Also, if you miss three or more appointments without notifying the office, you may be dismissed from the clinic at the provider's discretion.      For prescription refill requests, have your pharmacy contact our office and allow 72 hours for refills to be completed.    Today you received the following chemotherapy and/or immunotherapy agents Taxol/Carboplatin infusions      To help prevent nausea and vomiting after your treatment, we encourage you to take your nausea medication as directed.  BELOW ARE SYMPTOMS THAT SHOULD BE REPORTED IMMEDIATELY: *FEVER GREATER THAN 100.4 F (38 C) OR HIGHER *CHILLS OR SWEATING *NAUSEA AND VOMITING THAT IS NOT CONTROLLED WITH YOUR NAUSEA MEDICATION *UNUSUAL SHORTNESS OF BREATH *UNUSUAL BRUISING OR BLEEDING *URINARY PROBLEMS (pain or burning when urinating, or frequent urination) *BOWEL PROBLEMS (unusual diarrhea, constipation, pain near the anus) TENDERNESS IN MOUTH AND THROAT WITH OR WITHOUT PRESENCE OF ULCERS (sore throat, sores in mouth, or a toothache) UNUSUAL RASH, SWELLING OR PAIN  UNUSUAL VAGINAL DISCHARGE OR ITCHING   Items with * indicate a potential emergency and should be followed up as soon as possible or go to the Emergency Department if any problems should occur.  Please show the CHEMOTHERAPY ALERT CARD or IMMUNOTHERAPY ALERT CARD at check-in to  the Emergency Department and triage nurse.  Should you have questions after your visit or need to cancel or reschedule your appointment, please contact [facility] [phone]  and follow the prompts.  Office hours are 8:00 a.m. to 4:30 p.m. Monday - Friday. Please note that voicemails left after 4:00 p.m. may not be returned until the following business day.  We are closed weekends and major holidays. You have access to a nurse at all times for urgent questions. Please call the main number to the clinic [phone] and follow the prompts.  For any non-urgent questions, you may also contact your provider using MyChart. We now offer e-Visits for anyone 27 and older to request care online for non-urgent symptoms. For details visit [email or web address].   Also download the MyChart app! Go to the app store, search "MyChart", open the app, select [facility], and log in with your MyChart username and password.  Due to Covid, a mask is required upon entering the hospital/clinic. If you do not have a mask, one will be given to you upon arrival. For doctor visits, patients may have 1 support person aged 53 or older with them. For treatment visits, patients cannot have anyone with them due to current Covid guidelines and our immunocompromised population.

## 2021-05-02 NOTE — Progress Notes (Signed)
Patient has been assessed, vital signs and labs have been reviewed by Dr. Katragadda. ANC, Creatinine, LFTs, and Platelets are within treatment parameters per Dr. Katragadda. The patient is good to proceed with treatment at this time. Primary RN and pharmacy aware.  

## 2021-05-02 NOTE — Patient Instructions (Signed)
[facility] at [facility] Discharge Instructions  You were seen today by Dr. Katragadda. He went over your recent results, and you received your treatment. Dr. Katragadda will see you back in 3 weeks for labs and follow up.   Thank you for choosing [facility] at [facility] to provide your oncology and hematology care.  To afford each patient quality time with our provider, please arrive at least 15 minutes before your scheduled appointment time.   If you have a lab appointment with the Cancer Center please come in thru the Main Entrance and check in at the main information desk  You need to re-schedule your appointment should you arrive 10 or more minutes late.  We strive to give you quality time with our providers, and arriving late affects you and other patients whose appointments are after yours.  Also, if you no show three or more times for appointments you may be dismissed from the clinic at the providers discretion.     Again, thank you for choosing [facility].  Our hope is that these requests will decrease the amount of time that you wait before being seen by our physicians.       _____________________________________________________________  Should you have questions after your visit to [facility], please contact our office at [phone] between the hours of 8:00 a.m. and 4:30 p.m.  Voicemails left after 4:00 p.m. will not be returned until the following business day.  For prescription refill requests, have your pharmacy contact our office and allow 72 hours.    Cancer Center Support Programs:   > Cancer Support Group  2nd Tuesday of the month 1pm-2pm, Journey Room   

## 2021-05-02 NOTE — Progress Notes (Signed)
Patient presents today for Taxol/Carboplatin infusion per MD order.  Vital signs within parameters for treatment.  Labs pending.  Patient has no new complaints at this time.  Labs reviewed and within parameters for treatment.    Message received from [place], patient okay for treatment.  Taxol/Carboplatin given today per MD orders.  Stable during infusion without adverse affects.  Vital signs stable.  No complaints at this time.  Discharge from clinic ambulatory in stable condition.  Alert and oriented X 3.  Follow up with [facility] as scheduled.

## 2021-05-03 LAB — CA 125: Cancer Antigen (CA) 125: 30.4 U/mL (ref 0.0–38.1)

## 2021-05-04 ENCOUNTER — Other Ambulatory Visit: Payer: Self-pay

## 2021-05-04 ENCOUNTER — Ambulatory Visit (HOSPITAL_BASED_OUTPATIENT_CLINIC_OR_DEPARTMENT_OTHER)
Admission: RE | Admit: 2021-05-04 | Discharge: 2021-05-04 | Disposition: A | Discharge status: Home / Self Care | Payer: Medicare HMO | Source: Ambulatory Visit | Attending: Hematology | Admitting: Hematology

## 2021-05-04 ENCOUNTER — Inpatient Hospital Stay (HOSPITAL_COMMUNITY): Payer: Medicare HMO

## 2021-05-04 VITALS — BP 118/62 | HR 82 | Temp 98.3°F | Resp 18

## 2021-05-04 DIAGNOSIS — C786 Secondary malignant neoplasm of retroperitoneum and peritoneum: Secondary | ICD-10-CM

## 2021-05-04 DIAGNOSIS — Z95828 Presence of other vascular implants and grafts: Secondary | ICD-10-CM

## 2021-05-04 DIAGNOSIS — C569 Malignant neoplasm of unspecified ovary: Secondary | ICD-10-CM | POA: Diagnosis present

## 2021-05-04 DIAGNOSIS — Z5111 Encounter for antineoplastic chemotherapy: Secondary | ICD-10-CM | POA: Diagnosis not present

## 2021-05-04 MED ORDER — PEGFILGRASTIM-CBQV 6 MG/0.6ML ~~LOC~~ SOSY
PREFILLED_SYRINGE | SUBCUTANEOUS | Status: AC
  Filled 2021-05-04: qty 0.6

## 2021-05-04 MED ORDER — PEGFILGRASTIM-CBQV 6 MG/0.6ML ~~LOC~~ SOSY
6.0000 mg | PREFILLED_SYRINGE | Freq: Once | SUBCUTANEOUS | Status: AC
  Administered 2021-05-04: 6 mg via SUBCUTANEOUS

## 2021-05-04 NOTE — Patient Instructions (Signed)
[facility]  Discharge Instructions: ?Thank you for choosing [facility] to provide your oncology and hematology care.  ?If you have a lab appointment with the Cancer Center, please come in thru the Main Entrance and check in at the main information desk. ? ?Wear comfortable clothing and clothing appropriate for easy access to any Portacath or PICC line.  ? ?We strive to give you quality time with your provider. You may need to reschedule your appointment if you arrive late (15 or more minutes).  Arriving late affects you and other patients whose appointments are after yours.  Also, if you miss three or more appointments without notifying the office, you may be dismissed from the clinic at the provider?s discretion.    ?  ?For prescription refill requests, have your pharmacy contact our office and allow 72 hours for refills to be completed.   ? ?Today you received the following chemotherapy and/or immunotherapy agents Udenyca injection.     ?  ?To help prevent nausea and vomiting after your treatment, we encourage you to take your nausea medication as directed. ? ?BELOW ARE SYMPTOMS THAT SHOULD BE REPORTED IMMEDIATELY: ?*FEVER GREATER THAN 100.4 F (38 ?C) OR HIGHER ?*CHILLS OR SWEATING ?*NAUSEA AND VOMITING THAT IS NOT CONTROLLED WITH YOUR NAUSEA MEDICATION ?*UNUSUAL SHORTNESS OF BREATH ?*UNUSUAL BRUISING OR BLEEDING ?*URINARY PROBLEMS (pain or burning when urinating, or frequent urination) ?*BOWEL PROBLEMS (unusual diarrhea, constipation, pain near the anus) ?TENDERNESS IN MOUTH AND THROAT WITH OR WITHOUT PRESENCE OF ULCERS (sore throat, sores in mouth, or a toothache) ?UNUSUAL RASH, SWELLING OR PAIN  ?UNUSUAL VAGINAL DISCHARGE OR ITCHING  ? ?Items with * indicate a potential emergency and should be followed up as soon as possible or go to the Emergency Department if any problems should occur. ? ?Please show the CHEMOTHERAPY ALERT CARD or IMMUNOTHERAPY ALERT CARD at check-in to the  Emergency Department and triage nurse. ? ?Should you have questions after your visit or need to cancel or reschedule your appointment, please contact [facility] [phone]  and follow the prompts.  Office hours are 8:00 a.m. to 4:30 p.m. Monday - Friday. Please note that voicemails left after 4:00 p.m. may not be returned until the following business day.  We are closed weekends and major holidays. You have access to a nurse at all times for urgent questions. Please call the main number to the clinic [phone] and follow the prompts. ? ?For any non-urgent questions, you may also contact your provider using MyChart. We now offer e-Visits for anyone 18 and older to request care online for non-urgent symptoms. For details visit [email or web address]. ?  ?Also download the MyChart app! Go to the app store, search "MyChart", open the app, select [facility], and log in with your MyChart username and password. ? ?Due to Covid, a mask is required upon entering the hospital/clinic. If you do not have a mask, one will be given to you upon arrival. For doctor visits, patients may have 1 support person aged 18 or older with them. For treatment visits, patients cannot have anyone with them due to current Covid guidelines and our immunocompromised population.  ?

## 2021-05-04 NOTE — Progress Notes (Signed)
[name] presents today for Udenyca injection per the provider's orders.  Stable during administration without incident; injection site WNL; see MAR for injection details.  Patient tolerated procedure well and without incident.  No questions or complaints noted at this time. Vital signs stable.  Discharge from clinic via wheelchair in stable condition.  Alert and oriented X 3.  Follow up with [facility] as scheduled.

## 2021-05-08 LAB — ACID FAST CULTURE WITH REFLEXED SENSITIVITIES (MYCOBACTERIA): Acid Fast Culture: NEGATIVE

## 2021-05-23 ENCOUNTER — Inpatient Hospital Stay (HOSPITAL_BASED_OUTPATIENT_CLINIC_OR_DEPARTMENT_OTHER): Payer: Medicare HMO | Admitting: Hematology and Oncology

## 2021-05-23 ENCOUNTER — Telehealth (HOSPITAL_COMMUNITY): Payer: Self-pay | Admitting: Pharmacy Technician

## 2021-05-23 ENCOUNTER — Other Ambulatory Visit: Payer: Self-pay

## 2021-05-23 ENCOUNTER — Encounter (HOSPITAL_COMMUNITY): Payer: Self-pay | Admitting: Hematology and Oncology

## 2021-05-23 ENCOUNTER — Telehealth (HOSPITAL_COMMUNITY): Payer: Self-pay | Admitting: Pharmacist

## 2021-05-23 ENCOUNTER — Inpatient Hospital Stay (HOSPITAL_COMMUNITY): Payer: Medicare HMO | Attending: Hematology

## 2021-05-23 ENCOUNTER — Other Ambulatory Visit (HOSPITAL_COMMUNITY): Payer: Self-pay

## 2021-05-23 ENCOUNTER — Inpatient Hospital Stay (HOSPITAL_COMMUNITY): Payer: Medicare HMO

## 2021-05-23 ENCOUNTER — Encounter (HOSPITAL_COMMUNITY): Payer: Self-pay | Admitting: Hematology

## 2021-05-23 DIAGNOSIS — G620 Drug-induced polyneuropathy: Secondary | ICD-10-CM

## 2021-05-23 DIAGNOSIS — D649 Anemia, unspecified: Secondary | ICD-10-CM

## 2021-05-23 DIAGNOSIS — J9611 Chronic respiratory failure with hypoxia: Secondary | ICD-10-CM | POA: Diagnosis not present

## 2021-05-23 DIAGNOSIS — Z7952 Long term (current) use of systemic steroids: Secondary | ICD-10-CM | POA: Insufficient documentation

## 2021-05-23 DIAGNOSIS — Z79899 Other long term (current) drug therapy: Secondary | ICD-10-CM | POA: Diagnosis not present

## 2021-05-23 DIAGNOSIS — C786 Secondary malignant neoplasm of retroperitoneum and peritoneum: Secondary | ICD-10-CM | POA: Insufficient documentation

## 2021-05-23 DIAGNOSIS — I10 Essential (primary) hypertension: Secondary | ICD-10-CM | POA: Diagnosis not present

## 2021-05-23 DIAGNOSIS — J9610 Chronic respiratory failure, unspecified whether with hypoxia or hypercapnia: Secondary | ICD-10-CM | POA: Insufficient documentation

## 2021-05-23 DIAGNOSIS — Z8042 Family history of malignant neoplasm of prostate: Secondary | ICD-10-CM | POA: Diagnosis not present

## 2021-05-23 DIAGNOSIS — Z85028 Personal history of other malignant neoplasm of stomach: Secondary | ICD-10-CM | POA: Diagnosis not present

## 2021-05-23 DIAGNOSIS — C569 Malignant neoplasm of unspecified ovary: Secondary | ICD-10-CM | POA: Insufficient documentation

## 2021-05-23 DIAGNOSIS — D61810 Antineoplastic chemotherapy induced pancytopenia: Secondary | ICD-10-CM | POA: Diagnosis not present

## 2021-05-23 DIAGNOSIS — Z9981 Dependence on supplemental oxygen: Secondary | ICD-10-CM | POA: Diagnosis not present

## 2021-05-23 DIAGNOSIS — Z452 Encounter for adjustment and management of vascular access device: Secondary | ICD-10-CM | POA: Insufficient documentation

## 2021-05-23 DIAGNOSIS — Z8249 Family history of ischemic heart disease and other diseases of the circulatory system: Secondary | ICD-10-CM | POA: Diagnosis not present

## 2021-05-23 DIAGNOSIS — T451X5A Adverse effect of antineoplastic and immunosuppressive drugs, initial encounter: Secondary | ICD-10-CM | POA: Diagnosis not present

## 2021-05-23 DIAGNOSIS — Z7982 Long term (current) use of aspirin: Secondary | ICD-10-CM | POA: Diagnosis not present

## 2021-05-23 DIAGNOSIS — Z87891 Personal history of nicotine dependence: Secondary | ICD-10-CM | POA: Diagnosis not present

## 2021-05-23 DIAGNOSIS — D539 Nutritional anemia, unspecified: Secondary | ICD-10-CM

## 2021-05-23 LAB — CBC WITH DIFFERENTIAL/PLATELET
Abs Immature Granulocytes: 0.02 10*3/uL (ref 0.00–0.07)
Basophils Absolute: 0 10*3/uL (ref 0.0–0.1)
Basophils Relative: 0 %
Eosinophils Absolute: 0 10*3/uL (ref 0.0–0.5)
Eosinophils Relative: 1 %
HCT: 25.7 % — ABNORMAL LOW (ref 36.0–46.0)
Hemoglobin: 8.1 g/dL — ABNORMAL LOW (ref 12.0–15.0)
Immature Granulocytes: 0 %
Lymphocytes Relative: 30 %
Lymphs Abs: 1.4 10*3/uL (ref 0.7–4.0)
MCH: 34.6 pg — ABNORMAL HIGH (ref 26.0–34.0)
MCHC: 31.5 g/dL (ref 30.0–36.0)
MCV: 109.8 fL — ABNORMAL HIGH (ref 80.0–100.0)
Monocytes Absolute: 0.5 10*3/uL (ref 0.1–1.0)
Monocytes Relative: 10 %
Neutro Abs: 2.8 10*3/uL (ref 1.7–7.7)
Neutrophils Relative %: 59 %
Platelets: 132 10*3/uL — ABNORMAL LOW (ref 150–400)
RBC: 2.34 MIL/uL — ABNORMAL LOW (ref 3.87–5.11)
RDW: 17.3 % — ABNORMAL HIGH (ref 11.5–15.5)
WBC: 4.7 10*3/uL (ref 4.0–10.5)
nRBC: 0 % (ref 0.0–0.2)

## 2021-05-23 LAB — COMPREHENSIVE METABOLIC PANEL
ALT: 13 U/L (ref 0–44)
AST: 22 U/L (ref 15–41)
Albumin: 3.6 g/dL (ref 3.5–5.0)
Alkaline Phosphatase: 78 U/L (ref 38–126)
Anion gap: 8 (ref 5–15)
BUN: 9 mg/dL (ref 8–23)
CO2: 32 mmol/L (ref 22–32)
Calcium: 8.6 mg/dL — ABNORMAL LOW (ref 8.9–10.3)
Chloride: 99 mmol/L (ref 98–111)
Creatinine, Ser: 0.45 mg/dL (ref 0.44–1.00)
GFR, Estimated: >60 mL/min (ref >60)
Glucose, Bld: 97 mg/dL (ref 70–99)
Potassium: 3.8 mmol/L (ref 3.5–5.1)
Sodium: 139 mmol/L (ref 135–145)
Total Bilirubin: 0.5 mg/dL (ref 0.3–1.2)
Total Protein: 6.3 g/dL — ABNORMAL LOW (ref 6.5–8.1)

## 2021-05-23 LAB — FERRITIN: Ferritin: 721 ng/mL — ABNORMAL HIGH (ref 11–307)

## 2021-05-23 LAB — IRON AND TIBC
Iron: 50 ug/dL (ref 28–170)
Saturation Ratios: 17 % (ref 10.4–31.8)
TIBC: 294 ug/dL (ref 250–450)
UIBC: 244 ug/dL

## 2021-05-23 LAB — VITAMIN B12: Vitamin B-12: 3700 pg/mL — ABNORMAL HIGH (ref 180–914)

## 2021-05-23 MED ORDER — SODIUM CHLORIDE 0.9% FLUSH
10.0000 mL | INTRAVENOUS | Status: DC | PRN
  Administered 2021-05-23: 10 mL via INTRAVENOUS

## 2021-05-23 MED ORDER — NIRAPARIB TOSYLATE 100 MG PO CAPS
200.0000 mg | ORAL_CAPSULE | Freq: Every day | ORAL | 11 refills | Status: DC
  Filled 2021-05-23 – 2021-05-25 (×2): qty 60, 30d supply, fill #0
  Filled 2021-06-20: qty 60, 30d supply, fill #1
  Filled 2021-07-27: qty 60, 30d supply, fill #2
  Filled 2021-08-21: qty 60, 30d supply, fill #3
  Filled 2021-09-20: qty 60, 30d supply, fill #4
  Filled 2021-10-23: qty 60, 30d supply, fill #5
  Filled 2021-11-16: qty 60, 30d supply, fill #6
  Filled 2021-12-20: qty 60, 30d supply, fill #7
  Filled 2022-01-16: qty 60, 30d supply, fill #8
  Filled 2022-02-26: qty 60, 30d supply, fill #9

## 2021-05-23 MED ORDER — HEPARIN SOD (PORK) LOCK FLUSH 100 UNIT/ML IV SOLN
500.0000 [IU] | Freq: Once | INTRAVENOUS | Status: AC
  Administered 2021-05-23: 500 [IU] via INTRAVENOUS

## 2021-05-23 NOTE — Progress Notes (Signed)
Pt presents today for treatment, pt receiving Taxol/Carbo. Labs WNL for treatment today.

## 2021-05-23 NOTE — Telephone Encounter (Signed)
Oral Oncology Patient Advocate Encounter   Received notification from [facility] that prior authorization for Ze[place]ula is required.   PA submitted on CoverMyMeds Key [id number] Status is pending   Oral Oncology Clinic will continue to follow.  [facility] Patient [place] Phone [phone] Fax [phone] 05/24/2021 1:29 PM

## 2021-05-23 NOTE — Assessment & Plan Note (Signed)
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

## 2021-05-23 NOTE — Assessment & Plan Note (Signed)
She is oxygen dependent I am hopeful, if her blood counts recover in the future, her breathing status will improve

## 2021-05-23 NOTE — Progress Notes (Signed)
[facility] progress notes  Patient Care Team: [name], MD as PCP - General (Family Medicine)  CHIEF COMPLAINTS/PURPOSE OF VISIT:  Ovarian cancer, for further management  HISTORY OF PRESENTING ILLNESS:  [name] 65 y.o. 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 Denman George, she was deemed not a surgical candidate due to chronic respiratory failure She was recommended to proceed with chemotherapy and once she has achieved best response to therapy, to be switched to maintenance treatment She complains of chronic fatigue She denies nausea or changes in bowel habits Her neuropathy is stable She was hospitalized several months ago with sepsis but denies recent infection, fever or chills The patient denies any recent signs or symptoms of bleeding such as spontaneous epistaxis, hematuria or hematochezia.   I reviewed the patient's records extensive and collaborated the history with the patient. Summary of her history is as follows: Oncology History  Peritoneal carcinomatosis ([place])  06/06/2020 Initial Diagnosis   Peritoneal carcinomatosis ([place])    07/05/2020 -  Chemotherapy    Patient is on Treatment Plan: OVARIAN CARBOPLATIN (AUC 6) / PACLITAXEL (175) Q21D X 6 CYCLES       07/22/2020 Genetic Testing   Negative genetic testing: no pathogenic variants detected in [place] Expanded CancerNext Panel + RNAInsight. The CancerNext-Expanded gene panel offered by [facility] and includes sequencing and rearrangement analysis for the following 77 genes: AIP, ALK, APC*, ATM*, AXIN2, BAP1, BARD1, BLM, BMPR1A, BRCA1*, BRCA2*, BRIP1*, CDC73, CDH1*, CDK4,  CDKN1B, CDKN2A, CHEK2*, CTNNA1, DICER1, FANCC, FH, FLCN, GALNT12, KIF1B, LZTR1, MAX, MEN1, MET, MLH1*, MSH2*, MSH3, MSH6*, MUTYH*, NBN, NF1*, NF2, NTHL1, PALB2*, PHOX2B, PMS2*, POT1, PRKAR1A, PTCH1, PTEN*, RAD51C*, RAD51D*, RB1, RECQL, RET, SDHA, SDHAF2, SDHB, SDHC, SDHD, SMAD4, SMARCA4, SMARCB1, SMARCE1, STK11, SUFU, TMEM127, TP53*, TSC1, TSC2, VHL and XRCC2 (sequencing and deletion/duplication); EGFR, EGLN1, HOXB13, KIT, MITF, PDGFRA, POLD1, and POLE (sequencing only); EPCAM and GREM1 (deletion/duplication only). DNA and RNA analyses performed for * genes. The report date is July 22, 2020.      MEDICAL HISTORY:  Past Medical History:  Diagnosis Date   Anemia    Aortic atherosclerosis (HCC)    Arthritis    Asthma    Bilateral carotid artery stenosis    Cancer (HCC)    Gastric Cancer   COPD (chronic obstructive pulmonary disease) (HCC)    High cholesterol    Hyperlipidemia    Hypertension    Neuropathy    Osteoarthritis    Ovarian cancer ([place])    Oxygen dependent    Peripheral vascular disease ([place])    Peritoneal carcinoma ([place])    Pneumonia 2019   Port-A-Cath in place 07/03/2020   Pulmonary emphysema ([place])     SURGICAL HISTORY: Past Surgical History:  Procedure Laterality Date   ESOPHAGOGASTRODUODENOSCOPY (EGD) WITH PROPOFOL N/A 02/17/2021   Procedure: ESOPHAGOGASTRODUODENOSCOPY (EGD) WITH PROPOFOL;  Surgeon: Harvel Quale, MD;  [facility];  Service: Gastroenterology;  Laterality: N/A;   HALLUX VALGUS BASE WEDGE Right 06/09/2015   Procedure: Base wedge osteotomy with modified McBride right foot ;  Surgeon: Sharlotte Alamo, MD;  Location: [facility] OR[facility];  Service: Podiatry;  Laterality: Right;   PORTACATH PLACEMENT Left 06/28/2020   Procedure: PORT-A-CATHETER PLACEMENT LEFT  CHEST (attached catheter in left subclavian);  Surgeon: Virl Cagey, MD;  Location: [facility];  Service: General;  Laterality: Left;   TUBAL LIGATION     VIDEO BRONCHOSCOPY WITH  ENDOBRONCHIAL NAVIGATION N/A 03/09/2021   Procedure: VIDEO BRONCHOSCOPY WITH ENDOBRONCHIAL NAVIGATION;  Surgeon: Ottie Glazier, MD;  Location: [facility] OR[facility];  Service: Thoracic;  Laterality: N/A;   VIDEO BRONCHOSCOPY WITH ENDOBRONCHIAL ULTRASOUND N/A 03/09/2021   Procedure: VIDEO BRONCHOSCOPY WITH ENDOBRONCHIAL ULTRASOUND;  Surgeon: Ottie Glazier, MD;  Location: [facility] OR[facility];  Service: Thoracic;  Laterality: N/A;    SOCIAL HISTORY: Social History   Socioeconomic History   Marital status: Divorced    Spouse name: Not on file   Number of children: 3   Years of education: Not on file   Highest education level: Not on file  Occupational History   Occupation: DISABLED  Tobacco Use   Smoking status: Former    Packs/day: 2.00    Years: 30.00    Pack years: 60.00    Types: Cigarettes    Quit date: 11/04/2013    Years since quitting: 7.5   Smokeless tobacco: Never  Vaping Use   Vaping Use: Never used  Substance and Sexual Activity   Alcohol use: No   Drug use: No   Sexual activity: Not Currently  Other Topics Concern   Not on file  Social History Narrative   Not on file   Social Determinants of Health   Financial Resource Strain: Low Risk    Difficulty of Paying Living Expenses: Not hard at all  Food Insecurity: No Food Insecurity   Worried About Charity fundraiser in the Last Year: Never true   [place] in the Last Year: Never true  Transportation Needs: No Transportation Needs   Lack of Transportation (Medical): No   Lack of Transportation (Non-Medical): No  Physical Activity: Inactive   Days of Exercise per Week: 0 days   Minutes of Exercise per Session: 0 min  Stress: No Stress Concern Present   Feeling of Stress : Not at all  Social Connections: Socially Isolated   Frequency of Communication with Friends and Family: More than three times a week   Frequency of Social Gatherings with Friends and Family: Never   Attends Religious Services: Never   Corporate treasurer or Organizations: No   Attends Music therapist: Never   Marital Status: Divorced  Human resources officer Violence: Not At Risk   Fear of Current or Ex-Partner: No   Emotionally Abused: No   Physically Abused: No   Sexually Abused: No    FAMILY HISTORY: Family History  Problem Relation Age of Onset   Alzheimer's disease Mother    COPD Father    Emphysema Father    Hypertension Father    Healthy Sister    Healthy Brother    Alzheimer's disease Maternal Grandmother    Healthy Sister    Healthy Sister    Healthy Sister    Prostate cancer Other        paternal grandmother's brother; dx in early 35s   Breast cancer Neg Hx     ALLERGIES:  has No Known Allergies.  MEDICATIONS:  Current Outpatient Medications  Medication Sig Dispense Refill   amLODipine (NORVASC) 5 MG tablet Take 5 mg by mouth at bedtime.      aspirin EC 81 MG tablet Take 81 mg by mouth daily.     CARBOPLATIN IV Inject into the vein every 21 (  twenty-one) days.     ferrous sulfate 325 (65 FE) MG tablet Take 325 mg by mouth every other day.     Fluticasone-Umeclidin-Vilant (TRELEGY ELLIPTA) 100-62.5-25 MCG/INH AEPB Inhale 1 puff into the lungs 2 (two) times daily.     gabapentin (NEURONTIN) 600 MG tablet Take 600 mg by mouth 2 (two) times daily.     levofloxacin (LEVAQUIN) 500 MG tablet Take 500 mg by mouth daily.     magnesium oxide (MAG-OX) 400 (241.3 Mg) MG tablet Take 1 tablet (400 mg total) by mouth 2 (two) times daily. 60 tablet 3   magnesium oxide (MAG-OX) 400 MG tablet Take 1 tablet by mouth 2 (two) times daily.     nortriptyline (PAMELOR) 25 MG capsule Take 25 mg by mouth at bedtime.     ondansetron (ZOFRAN ODT) 4 MG disintegrating tablet 17m ODT q4 hours prn nausea/vomit (Patient taking differently: Take 4 mg by mouth every 4 (four) hours as needed for nausea or vomiting.) 12 tablet 0   OXYGEN Inhale 3 L into the lungs continuous.      PACLITAXEL IV Inject into the vein every 21 (  twenty-one) days.     pantoprazole (PROTONIX) 40 MG tablet Take 1 tablet (40 mg total) by mouth daily. 30 tablet 2   predniSONE (DELTASONE) 5 MG tablet Take 5 mg by mouth daily with breakfast.     simvastatin (ZOCOR) 40 MG tablet Take 40 mg by mouth at bedtime.     acetylcysteine (MUCOMYST) 20 % nebulizer solution Inhale 4 mLs into the lungs every 6 (six) hours as needed (COPD). (Patient not taking: Reported on 05/23/2021)     albuterol (PROVENTIL HFA;VENTOLIN HFA) 108 (90 BASE) MCG/ACT inhaler Inhale 4-6 puffs into the lungs every 6 (six) hours as needed for wheezing or shortness of breath. (Patient not taking: Reported on 05/23/2021)     ipratropium (ATROVENT HFA) 17 MCG/ACT inhaler Inhale 2 puffs into the lungs every 6 (six) hours as needed for wheezing.  (Patient not taking: Reported on 05/23/2021)     ipratropium-albuterol (DUONEB) 0.5-2.5 (3) MG/3ML SOLN Take 0.5 mg by nebulization every 6 (six) hours as needed (COPD). (Patient not taking: Reported on 05/23/2021)     No current facility-administered medications for this visit.   Facility-Administered Medications Ordered in Other Visits  Medication Dose Route Frequency Provider Last Rate Last Admin   sodium chloride flush (NS) 0.9 % injection 10 mL  10 mL Intravenous PRN GAlvy Bimler Nivea Wojdyla, MD   10 mL at 05/23/21 0941    REVIEW OF SYSTEMS:   Constitutional: Denies fevers, chills or abnormal night sweats Eyes: Denies blurriness of vision, double vision or watery eyes Ears, nose, mouth, throat, and face: Denies mucositis or sore throat Respiratory: Denies cough, dyspnea or wheezes Cardiovascular: Denies palpitation, chest discomfort or lower extremity swelling Gastrointestinal:  Denies nausea, heartburn or change in bowel habits Skin: Denies abnormal skin rashes Lymphatics: Denies new lymphadenopathy or easy bruising Neurological:Denies numbness, tingling or new weaknesses Behavioral/Psych: Mood is stable, no new changes  All other systems were  reviewed with the patient and are negative.  PHYSICAL EXAMINATION: ECOG PERFORMANCE STATUS: 2 - Symptomatic, <50% confined to bed  Vitals:   05/23/21 0831  BP: 111/75  Pulse: 88  Resp: 18  Temp: (!) 96.9 F (36.1 C)  SpO2: 95%   Filed Weights   05/23/21 0831  Weight: 117 lb 12.8 oz (53.4 kg)    GENERAL:alert, no distress and comfortable.  She looks pale NEURO: no focal  motor/sensory deficits  LABORATORY DATA:  I have reviewed the data as listed Lab Results  Component Value Date   WBC 4.7 05/23/2021   HGB 8.1 (L) 05/23/2021   HCT 25.7 (L) 05/23/2021   MCV 109.8 (H) 05/23/2021   PLT 132 (L) 05/23/2021   Recent Labs    07/13/20 0914 07/26/20 0816 08/16/20 0804 09/06/20 0754 04/11/21 0802 05/02/21 0801 05/23/21 0834  NA 130* 140 137   < > 138 140 139  K 3.2* 3.6 3.5   < > 3.8 3.7 3.8  CL 87* 100 99   < > 98 99 99  CO2 33* 29 30   < > 32 35* 32  GLUCOSE 105* 111* 104*   < > 98 95 97  BUN 7* <5* 7*   < > '11 9 9  ' CREATININE 0.50 0.49 0.47   < > 0.47 0.49 0.45  CALCIUM 8.0* 8.5* 8.5*   < > 9.1 8.4* 8.6*  GFRNONAA >60 >60 >60   < > >60 >60 >60  GFRAA >60 >60 >60  --   --   --   --   PROT 5.4* 5.9* 6.3*   < > 6.6 6.5 6.3*  ALBUMIN 2.8* 2.8* 3.3*   < > 4.0 3.9 3.6  AST '26 17 17   ' < > '19 21 22  ' ALT '12 9 8   ' < > '12 13 13  ' ALKPHOS 100 78 71   < > 71 72 78  BILITOT 0.2* 0.4 0.5   < > 0.3 0.2* 0.5   < > = values in this interval not displayed.    RADIOGRAPHIC STUDIES: I have reviewed multiple CT imaging with the patient and family I have personally reviewed the radiological images as listed and agreed with the findings in the report. CT CHEST ABDOMEN PELVIS WO CONTRAST  Result Date: 05/04/2021 CLINICAL DATA:  Ovarian cancer.  Restaging. EXAM: CT CHEST, ABDOMEN AND PELVIS WITHOUT CONTRAST TECHNIQUE: Multidetector CT imaging of the chest, abdomen and pelvis was performed following the standard protocol without IV contrast. COMPARISON:  02/12/2021 FINDINGS: CT CHEST  FINDINGS Cardiovascular: The heart size is normal. No substantial pericardial effusion. Coronary artery calcification is evident. Atherosclerotic calcification is noted in the wall of the thoracic aorta. Left Port-A-Cath tip is positioned in the distal SVC. Mediastinum/Nodes: No mediastinal lymphadenopathy. No evidence for gross hilar lymphadenopathy although assessment is limited by the lack of intravenous contrast on today's study. Mild circumferential wall thickening noted distal esophagus (41/2). There is no axillary lymphadenopathy. Lungs/Pleura: Centrilobular and paraseptal emphysema evident. Biapical pleuroparenchymal scarring evident. The irregular and nodular areas of consolidative opacity seen on the previous study in the left upper lobe/apex have almost completely resolved in the interval. Residual architectural distortion in this region today is compatible with scarring. Several very tiny nodular opacities are seen in the anterior left upper lobe measuring in the 3-4 mm size range (see images 31, 32, and 35 of series 4). There is some trace clustered nodularity in the medial aspect of the anterior left upper lobe (67/4), also markedly improved in the interval since the prior study. Bronchial wall thickening with mild cylindrical bronchiectasis noted in the lower lungs bilaterally with superimposed tree-in-bud nodularity in the posterior left lower lobe. No pleural effusion on today's study. Musculoskeletal: No worrisome lytic or sclerotic osseous abnormality. CT ABDOMEN PELVIS FINDINGS Hepatobiliary: No focal abnormality in the liver on this study without intravenous contrast. There is no evidence for gallstones, gallbladder wall thickening, or pericholecystic  fluid. No intrahepatic or extrahepatic biliary dilation. Pancreas: No focal mass lesion. No dilatation of the main duct. No intraparenchymal cyst. No peripancreatic edema. Spleen: No splenomegaly. No focal mass lesion. Adrenals/Urinary Tract: No  adrenal nodule or mass. Unremarkable noncontrast appearance of the kidneys. No evidence for hydroureter. The urinary bladder appears normal for the degree of distention. Stomach/Bowel: Stomach is unremarkable. No gastric wall thickening. No evidence of outlet obstruction. Duodenum is normally positioned as is the ligament of Treitz. No small bowel wall thickening. No small bowel dilatation. The terminal ileum is normal. The appendix is normal. No gross colonic mass. No colonic wall thickening. Diverticular changes are noted in the left colon without evidence of diverticulitis. Vascular/Lymphatic: There is abdominal aortic atherosclerosis without aneurysm. There is no gastrohepatic or hepatoduodenal ligament lymphadenopathy. No retroperitoneal or mesenteric lymphadenopathy. Previously described tiny celiac nodes are stable. No pelvic sidewall lymphadenopathy. Reproductive: Calcified fibroids noted in the uterus. There is no adnexal mass. Other: No intraperitoneal free fluid. Soft tissue stranding in the omentum persists and while the distribution is similar to prior, the volume of soft tissue attenuation appears qualitatively decreased in the interval with less prominent nodularity on today's study. Musculoskeletal: No worrisome lytic or sclerotic osseous abnormality. IMPRESSION: 1. No findings today to suggest new or progressive disease related to ovarian cancer. 2. Interval near complete resolution of the irregular and nodular areas of consolidative left upper lobe opacity seen on the previous study. Residual architectural distortion in this region is compatible with scarring. Tiny nodules persist in the left upper lobe and attention on follow-up recommended. 3. Interval improvement in tree-in-bud nodularity anteromedial left upper lobe. Changes of bronchial wall thickening and mild cylindrical bronchiectasis in the lower lungs are similar to prior with persistent tree-in-bud nodularity posterior left lower lobe.  Imaging features suggest sequelae of atypical infection. 4. Similar appearance of the omental soft tissue stranding with less prominent nodularity on today's study. 5. No ascites. 6. Mild circumferential wall thickening distal esophagus. Esophagitis could have this appearance. 7. Left colonic diverticulosis without diverticulitis. 8. Aortic Atherosclerosis (ICD10-I70.0) and Emphysema (ICD10-J43.9). Electronically Signed   By: Misty Stanley M.D.   On: 05/04/2021 14:36    ASSESSMENT & PLAN:  Ovarian cancer ([place]) She has achieved best response with many cycles of chemotherapy She is getting worsening pancytopenia and neuropathy I have reviewed documentation from Dr. Denman George several months ago At this point in time, I recommend we move forward switching her to maintenance treatment With carboplatin sensitive disease, I would prefer PARP inhibitor as maintenance treatment We discussed the risk, benefits, side effects of niraparib and she is in agreement to proceed I will get insurance prior authorization to get her started within the next 7 to 10 days Due to her pancytopenia, I plan to start her at mildly reduced dose at 200 mg daily I will see her back in 2 weeks for further follow-up  Deficiency anemia This is likely due to side effects of chemotherapy I will order iron, B12 level etc. to see if we could help improve her anemia  Peripheral neuropathy due to chemotherapy ([place]) I am hopeful, giving her a treatment break will aid in recovery of chemotherapy-induced peripheral neuropathy She will continue gabapentin for now  Chronic respiratory failure ([place]) She is oxygen dependent I am hopeful, if her blood counts recover in the future, her breathing status will improve  Orders Placed This Encounter  Procedures   Ferritin    Standing Status:   Future  Number of Occurrences:   1    Standing Expiration Date:   05/23/2022   Iron and TIBC    Standing Status:   Future    Number of Occurrences:    1    Standing Expiration Date:   05/23/2022   Vitamin B12    Standing Status:   Future    Number of Occurrences:   1    Standing Expiration Date:   05/23/2022   Folate RBC    Standing Status:   Future    Number of Occurrences:   1    Standing Expiration Date:   05/23/2022    All questions were answered. The patient knows to call the clinic with any problems, questions or concerns. The total time spent in the appointment was 40 minutes encounter with patients including review of chart and various tests results, discussions about plan of care and coordination of care plan   Heath Lark, MD 05/23/2021 10:55 AM

## 2021-05-23 NOTE — Progress Notes (Signed)
Iron studies drawn today per Dr. Alvy Bimler. IV chemo treatments stopped. Will start oral chemotherapy. Will return in two weeks for follow- up with Dr. Alvy Bimler.

## 2021-05-23 NOTE — Assessment & Plan Note (Signed)
This is likely due to side effects of chemotherapy I will order iron, B12 level etc. to see if we could help improve her anemia

## 2021-05-23 NOTE — Addendum Note (Signed)
Addended by: Joanne Gavel T on: 05/23/2021 09:49 AM   Modules accepted: Orders

## 2021-05-23 NOTE — Assessment & Plan Note (Signed)
She has achieved best response with many cycles of chemotherapy She is getting worsening pancytopenia and neuropathy I have reviewed documentation from Dr. Denman George several months ago At this point in time, I recommend we move forward switching her to maintenance treatment With carboplatin sensitive disease, I would prefer PARP inhibitor as maintenance treatment We discussed the risk, benefits, side effects of niraparib and she is in agreement to proceed I will get insurance prior authorization to get her started within the next 7 to 10 days Due to her pancytopenia, I plan to start her at mildly reduced dose at 200 mg daily I will see her back in 2 weeks for further follow-up

## 2021-05-24 ENCOUNTER — Other Ambulatory Visit (HOSPITAL_COMMUNITY): Payer: Self-pay

## 2021-05-24 LAB — FOLATE RBC
Folate, Hemolysate: 418 ng/mL
Folate, RBC: 1699 ng/mL (ref >498)
Hematocrit: 24.6 % — ABNORMAL LOW (ref 34.0–46.6)

## 2021-05-24 LAB — CA 125: Cancer Antigen (CA) 125: 28.7 U/mL (ref 0.0–38.1)

## 2021-05-24 NOTE — Telephone Encounter (Signed)
Oral Oncology Patient Advocate Encounter  Prior Authorization for [name] has been approved.    PA# [id number] Effective dates: 05/24/21 through 11/20/21  Patients co-pay is $9.85  Oral Oncology Clinic will continue to follow.   [place] [facility] [name] Phone [phone] Fax [phone] 05/24/2021 1:29 PM

## 2021-05-24 NOTE — Telephone Encounter (Signed)
Oral Oncology Pharmacist Encounter  Received new prescription for Zejula (niraparib) for the maintenance treatment of ovarian cancer, planned duration until disease progression or unacceptable drug toxicity. Planned start 05/30/21, one week prior to her next f/u appt on 05/2021.  CBC/CMP from 05/23/21 assessed, no relevant lab abnormalities. BP Prescription dose and frequency assessed. MD starting patient on a reduced dose due to platelet count <150K/uL and weight <77kg.   Current medication list in Epic reviewed, no DDIs with niraparib identified.   Evaluated chart and no patient barriers to medication adherence identified.   Prescription has been e-scribed to the [facility] for benefits analysis and approval.  Oral Oncology Clinic will continue to follow for insurance authorization, copayment issues, initial counseling and start date.   Darl Pikes, PharmD, BCPS, BCOP, CPP Hematology/Oncology Clinical Pharmacist Practitioner [facility] [place] [phone]  05/24/2021 9:48 AM

## 2021-05-25 ENCOUNTER — Other Ambulatory Visit: Payer: Self-pay | Admitting: Hematology and Oncology

## 2021-05-25 ENCOUNTER — Other Ambulatory Visit (HOSPITAL_COMMUNITY): Payer: Self-pay

## 2021-05-25 ENCOUNTER — Ambulatory Visit (HOSPITAL_COMMUNITY): Payer: Medicare HMO

## 2021-05-25 DIAGNOSIS — C569 Malignant neoplasm of unspecified ovary: Secondary | ICD-10-CM

## 2021-05-25 MED ORDER — ONDANSETRON HCL 8 MG PO TABS: 8.0000 mg | ORAL_TABLET | Freq: Three times a day (TID) | ORAL | 3 refills | Status: DC | PRN

## 2021-05-25 NOTE — Telephone Encounter (Signed)
Oral Chemotherapy Pharmacist Encounter  [facility] will deliver medication to Ms. Guallpa on 7/12. She knows the plan is to get started on 7/13, one week prior to her f/u appt on 7/20.  Patient Education I spoke with patient for overview of new oral chemotherapy medication: Zejula (niraparib) for the maintenance treatment of ovarian cancer, planned duration until disease progression or unacceptable drug toxicity. Planned start 05/30/21, one week prior to her next f/u appt on 05/2021  Pt is doing well. Counseled patient on administration, dosing, side effects, monitoring, drug-food interactions, safe handling, storage, and disposal. Patient will take 2 capsules (200 mg total) by mouth daily. May take at bedtime to reduce nausea and vomiting.  Side effects include but not limited to: N/V, constipation, fatigue, decrease wbc/hgb/plt.    Reviewed the high emetic risk of niraparib, she has a prescription to use as needed. Suggested Ms. Farnam use the ondansetron 30-60 mins prior to her niraparib if needed.   Reviewed with patient importance of keeping a medication schedule and plan for any missed doses.  After discussion with patient no patient barriers to medication adherence identified.   Ms. Bufano voiced understanding and appreciation. All questions answered. Medication handout provided.  Provided patient with [facility] [place] phone number. Patient knows to call the office with questions or concerns. Oral Chemotherapy Navigation Clinic will continue to follow.  Darl Pikes, PharmD, BCPS, BCOP, CPP Hematology/Oncology Clinical Pharmacist Practitioner [facility] [place] [facility] [phone]  05/25/2021 11:45 AM

## 2021-05-28 ENCOUNTER — Other Ambulatory Visit (HOSPITAL_COMMUNITY): Payer: Self-pay

## 2021-06-06 ENCOUNTER — Encounter (HOSPITAL_COMMUNITY): Payer: Self-pay | Admitting: Hematology and Oncology

## 2021-06-06 ENCOUNTER — Other Ambulatory Visit: Payer: Self-pay

## 2021-06-06 ENCOUNTER — Inpatient Hospital Stay (HOSPITAL_COMMUNITY): Payer: Medicare HMO

## 2021-06-06 ENCOUNTER — Inpatient Hospital Stay (HOSPITAL_BASED_OUTPATIENT_CLINIC_OR_DEPARTMENT_OTHER): Payer: Medicare HMO | Admitting: Hematology and Oncology

## 2021-06-06 DIAGNOSIS — D539 Nutritional anemia, unspecified: Secondary | ICD-10-CM | POA: Diagnosis not present

## 2021-06-06 DIAGNOSIS — Z452 Encounter for adjustment and management of vascular access device: Secondary | ICD-10-CM | POA: Diagnosis not present

## 2021-06-06 DIAGNOSIS — J9611 Chronic respiratory failure with hypoxia: Secondary | ICD-10-CM

## 2021-06-06 DIAGNOSIS — C786 Secondary malignant neoplasm of retroperitoneum and peritoneum: Secondary | ICD-10-CM

## 2021-06-06 DIAGNOSIS — C569 Malignant neoplasm of unspecified ovary: Secondary | ICD-10-CM

## 2021-06-06 LAB — COMPREHENSIVE METABOLIC PANEL
ALT: 15 U/L (ref 0–44)
AST: 29 U/L (ref 15–41)
Albumin: 4.2 g/dL (ref 3.5–5.0)
Alkaline Phosphatase: 65 U/L (ref 38–126)
Anion gap: 8 (ref 5–15)
BUN: 8 mg/dL (ref 8–23)
CO2: 32 mmol/L (ref 22–32)
Calcium: 8.9 mg/dL (ref 8.9–10.3)
Chloride: 95 mmol/L — ABNORMAL LOW (ref 98–111)
Creatinine, Ser: 0.71 mg/dL (ref 0.44–1.00)
GFR, Estimated: >60 mL/min (ref >60)
Glucose, Bld: 100 mg/dL — ABNORMAL HIGH (ref 70–99)
Potassium: 3.5 mmol/L (ref 3.5–5.1)
Sodium: 135 mmol/L (ref 135–145)
Total Bilirubin: 0.3 mg/dL (ref 0.3–1.2)
Total Protein: 7 g/dL (ref 6.5–8.1)

## 2021-06-06 LAB — CBC WITH DIFFERENTIAL/PLATELET
Abs Immature Granulocytes: 0.01 10*3/uL (ref 0.00–0.07)
Basophils Absolute: 0 10*3/uL (ref 0.0–0.1)
Basophils Relative: 1 %
Eosinophils Absolute: 0.1 10*3/uL (ref 0.0–0.5)
Eosinophils Relative: 3 %
HCT: 31.4 % — ABNORMAL LOW (ref 36.0–46.0)
Hemoglobin: 10.1 g/dL — ABNORMAL LOW (ref 12.0–15.0)
Immature Granulocytes: 0 %
Lymphocytes Relative: 26 %
Lymphs Abs: 1.4 10*3/uL (ref 0.7–4.0)
MCH: 34.2 pg — ABNORMAL HIGH (ref 26.0–34.0)
MCHC: 32.2 g/dL (ref 30.0–36.0)
MCV: 106.4 fL — ABNORMAL HIGH (ref 80.0–100.0)
Monocytes Absolute: 0.6 10*3/uL (ref 0.1–1.0)
Monocytes Relative: 11 %
Neutro Abs: 3.2 10*3/uL (ref 1.7–7.7)
Neutrophils Relative %: 59 %
Platelets: 194 10*3/uL (ref 150–400)
RBC: 2.95 MIL/uL — ABNORMAL LOW (ref 3.87–5.11)
RDW: 14.6 % (ref 11.5–15.5)
WBC: 5.4 10*3/uL (ref 4.0–10.5)
nRBC: 0 % (ref 0.0–0.2)

## 2021-06-06 NOTE — Progress Notes (Signed)
[name] OFFICE PROGRESS NOTE  Patient Care Team: [name], MD as PCP - General (Family Medicine)  ASSESSMENT & PLAN:  Ovarian cancer [facility]) She tolerated niraparib very well Her anemia is improving and she has excellent energy level with no side effects from treatment whatsoever She will return again next week for lab work only and see her primary oncologist in 2 weeks I recommend we check tumor marker once a month while on treatment and repeat CT imaging in September for further follow-up  Deficiency anemia - Since discontinuation of chemotherapy, she has rapid improvement of her blood count She has excellent energy level I am hopeful her anemia will continue to improve over the next few weeks  Chronic respiratory failure ([place]) Her respiratory failure is stable She has good energy level She will continue oxygen therapy as directed  No orders of the defined types were placed in this encounter.   All questions were answered. The patient knows to call the clinic with any problems, questions or concerns. The total time spent in the appointment was 20 minutes encounter with patients including review of chart and various tests results, discussions about plan of care and coordination of care plan   Heath Lark, MD 06/06/2021 1:40 PM  INTERVAL HISTORY: Please see below for problem oriented charting. She returns for further follow-up She started niraparib last week She has excellent energy level She is able to walk her dog twice without difficulties She denies worsening peripheral neuropathy No recent bleeding No recent nausea or changes in bowel habits  SUMMARY OF ONCOLOGIC HISTORY: Oncology History  Peritoneal carcinomatosis ([place])  06/06/2020 Initial Diagnosis   Peritoneal carcinomatosis ([place])    07/05/2020 -  Chemotherapy    Patient is on Treatment Plan: OVARIAN CARBOPLATIN (AUC 6) / PACLITAXEL (175) Q21D X 6 CYCLES       07/22/2020 Genetic Testing    Negative genetic testing: no pathogenic variants detected in [place] Expanded CancerNext Panel + RNAInsight. The CancerNext-Expanded gene panel offered by [facility] and includes sequencing and rearrangement analysis for the following 77 genes: AIP, ALK, APC*, ATM*, AXIN2, BAP1, BARD1, BLM, BMPR1A, BRCA1*, BRCA2*, BRIP1*, CDC73, CDH1*, CDK4, CDKN1B, CDKN2A, CHEK2*, CTNNA1, DICER1, FANCC, FH, FLCN, GALNT12, KIF1B, LZTR1, MAX, MEN1, MET, MLH1*, MSH2*, MSH3, MSH6*, MUTYH*, NBN, NF1*, NF2, NTHL1, PALB2*, PHOX2B, PMS2*, POT1, PRKAR1A, PTCH1, PTEN*, RAD51C*, RAD51D*, RB1, RECQL, RET, SDHA, SDHAF2, SDHB, SDHC, SDHD, SMAD4, SMARCA4, SMARCB1, SMARCE1, STK11, SUFU, TMEM127, TP53*, TSC1, TSC2, VHL and XRCC2 (sequencing and deletion/duplication); EGFR, EGLN1, HOXB13, KIT, MITF, PDGFRA, POLD1, and POLE (sequencing only); EPCAM and GREM1 (deletion/duplication only). DNA and RNA analyses performed for * genes. The report date is July 22, 2020.    Ovarian cancer ([place])  07/07/2020 Initial Diagnosis   Ovarian cancer ([place])   05/04/2021 Imaging   1. No findings today to suggest new or progressive disease related to ovarian cancer. 2. Interval near complete resolution of the irregular and nodular areas of consolidative left upper lobe opacity seen on the previous study. Residual architectural distortion in this region is compatible with scarring. Tiny nodules persist in the left upper lobe and attention on follow-up recommended. 3. Interval improvement in tree-in-bud nodularity anteromedial left upper lobe. Changes of bronchial wall thickening and mild cylindrical bronchiectasis in the lower lungs are similar to prior with persistent tree-in-bud nodularity posterior left lower lobe. Imaging features suggest sequelae of atypical infection. 4. Similar appearance of the omental soft tissue stranding with less prominent nodularity on today's study. 5.  No ascites. 6. Mild circumferential wall thickening distal  esophagus. Esophagitis could have this appearance. 7. Left colonic diverticulosis without diverticulitis. 8. Aortic Atherosclerosis (ICD10-I70.0) and Emphysema (ICD10-J43.9).   05/30/2021 -  Chemotherapy   She takes niraparib         REVIEW OF SYSTEMS:   Constitutional: Denies fevers, chills or abnormal weight loss Eyes: Denies blurriness of vision Ears, nose, mouth, throat, and face: Denies mucositis or sore throat Respiratory: Denies cough, dyspnea or wheezes Cardiovascular: Denies palpitation, chest discomfort or lower extremity swelling Gastrointestinal:  Denies nausea, heartburn or change in bowel habits Skin: Denies abnormal skin rashes Lymphatics: Denies new lymphadenopathy or easy bruising Neurological:Denies numbness, tingling or new weaknesses Behavioral/Psych: Mood is stable, no new changes  All other systems were reviewed with the patient and are negative.  I have reviewed the past medical history, past surgical history, social history and family history with the patient and they are unchanged from previous note.  ALLERGIES:  has No Known Allergies.  MEDICATIONS:  Current Outpatient Medications  Medication Sig Dispense Refill   acetylcysteine (MUCOMYST) 20 % nebulizer solution Inhale 4 mLs into the lungs every 6 (six) hours as needed (COPD).     albuterol (PROVENTIL HFA;VENTOLIN HFA) 108 (90 BASE) MCG/ACT inhaler Inhale 4-6 puffs into the lungs every 6 (six) hours as needed for wheezing or shortness of breath.     amLODipine (NORVASC) 5 MG tablet Take 5 mg by mouth at bedtime.      aspirin EC 81 MG tablet Take 81 mg by mouth daily.     CARBOPLATIN IV Inject into the vein every 21 ( twenty-one) days.     ferrous sulfate 325 (65 FE) MG tablet Take 325 mg by mouth every other day.     Fluticasone-Umeclidin-Vilant (TRELEGY ELLIPTA) 100-62.5-25 MCG/INH AEPB Inhale 1 puff into the lungs 2 (two) times daily.     gabapentin (NEURONTIN) 600 MG tablet Take 600 mg by mouth 2  (two) times daily.     ipratropium (ATROVENT HFA) 17 MCG/ACT inhaler Inhale 2 puffs into the lungs every 6 (six) hours as needed for wheezing.     ipratropium-albuterol (DUONEB) 0.5-2.5 (3) MG/3ML SOLN Take 0.5 mg by nebulization every 6 (six) hours as needed (COPD).     levofloxacin (LEVAQUIN) 500 MG tablet Take 500 mg by mouth daily.     magnesium oxide (MAG-OX) 400 (241.3 Mg) MG tablet Take 1 tablet (400 mg total) by mouth 2 (two) times daily. 60 tablet 3   magnesium oxide (MAG-OX) 400 MG tablet Take 1 tablet by mouth 2 (two) times daily.     niraparib tosylate (ZEJULA) 100 MG capsule Take 2 capsules (200 mg total) by mouth daily. May take at bedtime to reduce nausea and vomiting. 60 capsule 11   nortriptyline (PAMELOR) 25 MG capsule Take 25 mg by mouth at bedtime.     OXYGEN Inhale 3 L into the lungs continuous.      PACLITAXEL IV Inject into the vein every 21 ( twenty-one) days.     pantoprazole (PROTONIX) 40 MG tablet Take 1 tablet (40 mg total) by mouth daily. 30 tablet 2   predniSONE (DELTASONE) 5 MG tablet Take 5 mg by mouth daily with breakfast.     simvastatin (ZOCOR) 40 MG tablet Take 40 mg by mouth at bedtime.     ondansetron (ZOFRAN ODT) 4 MG disintegrating tablet 21m ODT q4 hours prn nausea/vomit (Patient not taking: Reported on 06/06/2021) 12 tablet 0   ondansetron (ZOFRAN) 8  MG tablet Take 1 tablet (8 mg total) by mouth every 8 (eight) hours as needed for nausea. (Patient not taking: Reported on 06/06/2021) 30 tablet 3   No current facility-administered medications for this visit.    PHYSICAL EXAMINATION: ECOG PERFORMANCE STATUS: 1 - Symptomatic but completely ambulatory  GENERAL:alert, no distress and comfortable.  She has oxygen delivered via nasal cannula Musculoskeletal:no cyanosis of digits and no clubbing  NEURO: alert & oriented x 3 with fluent speech, no focal motor/sensory deficits  LABORATORY DATA:  I have reviewed the data as listed    Component Value Date/Time    NA 135 06/06/2021 1212   NA 139 11/07/2014 0018   K 3.5 06/06/2021 1212   K 3.6 11/07/2014 0018   CL 95 (L) 06/06/2021 1212   CL 102 11/07/2014 0018   CO2 32 06/06/2021 1212   CO2 29 11/07/2014 0018   GLUCOSE 100 (H) 06/06/2021 1212   GLUCOSE 108 (H) 11/07/2014 0018   BUN 8 06/06/2021 1212   BUN 6 (L) 11/07/2014 0018   CREATININE 0.71 06/06/2021 1212   CREATININE 0.65 11/07/2014 0018   CALCIUM 8.9 06/06/2021 1212   CALCIUM 9.4 11/07/2014 0018   PROT 7.0 06/06/2021 1212   PROT 6.9 10/18/2014 1210   ALBUMIN 4.2 06/06/2021 1212   ALBUMIN 3.9 10/18/2014 1210   AST 29 06/06/2021 1212   AST 24 10/18/2014 1210   ALT 15 06/06/2021 1212   ALT 19 10/18/2014 1210   ALKPHOS 65 06/06/2021 1212   ALKPHOS 98 10/18/2014 1210   BILITOT 0.3 06/06/2021 1212   BILITOT 0.3 10/18/2014 1210   GFRNONAA >60 06/06/2021 1212   GFRNONAA >60 11/07/2014 0018   GFRNONAA >60 07/19/2014 1615   GFRAA >60 08/16/2020 0804   GFRAA >60 11/07/2014 0018   GFRAA >60 07/19/2014 1615    No results found for: SPEP, UPEP  Lab Results  Component Value Date   WBC 5.4 06/06/2021   NEUTROABS 3.2 06/06/2021   HGB 10.1 (L) 06/06/2021   HCT 31.4 (L) 06/06/2021   MCV 106.4 (H) 06/06/2021   PLT 194 06/06/2021      Chemistry      Component Value Date/Time   NA 135 06/06/2021 1212   NA 139 11/07/2014 0018   K 3.5 06/06/2021 1212   K 3.6 11/07/2014 0018   CL 95 (L) 06/06/2021 1212   CL 102 11/07/2014 0018   CO2 32 06/06/2021 1212   CO2 29 11/07/2014 0018   BUN 8 06/06/2021 1212   BUN 6 (L) 11/07/2014 0018   CREATININE 0.71 06/06/2021 1212   CREATININE 0.65 11/07/2014 0018      Component Value Date/Time   CALCIUM 8.9 06/06/2021 1212   CALCIUM 9.4 11/07/2014 0018   ALKPHOS 65 06/06/2021 1212   ALKPHOS 98 10/18/2014 1210   AST 29 06/06/2021 1212   AST 24 10/18/2014 1210   ALT 15 06/06/2021 1212   ALT 19 10/18/2014 1210   BILITOT 0.3 06/06/2021 1212   BILITOT 0.3 10/18/2014 1210

## 2021-06-06 NOTE — Assessment & Plan Note (Signed)
-   Since discontinuation of chemotherapy, she has rapid improvement of her blood count She has excellent energy level I am hopeful her anemia will continue to improve over the next few weeks

## 2021-06-06 NOTE — Assessment & Plan Note (Signed)
She tolerated niraparib very well Her anemia is improving and she has excellent energy level with no side effects from treatment whatsoever She will return again next week for lab work only and see her primary oncologist in 2 weeks I recommend we check tumor marker once a month while on treatment and repeat CT imaging in September for further follow-up

## 2021-06-06 NOTE — Patient Instructions (Signed)
[facility]  Discharge Instructions: Thank you for choosing [facility] to provide your oncology and hematology care.  If you have a lab appointment with the Cancer Center, please come in thru the Main Entrance and check in at the main information desk.  Wear comfortable clothing and clothing appropriate for easy access to any Portacath or PICC line.   We strive to give you quality time with your provider. You may need to reschedule your appointment if you arrive late (15 or more minutes).  Arriving late affects you and other patients whose appointments are after yours.  Also, if you miss three or more appointments without notifying the office, you may be dismissed from the clinic at the provider's discretion.      For prescription refill requests, have your pharmacy contact our office and allow 72 hours for refills to be completed.        To help prevent nausea and vomiting after your treatment, we encourage you to take your nausea medication as directed.  BELOW ARE SYMPTOMS THAT SHOULD BE REPORTED IMMEDIATELY: *FEVER GREATER THAN 100.4 F (38 C) OR HIGHER *CHILLS OR SWEATING *NAUSEA AND VOMITING THAT IS NOT CONTROLLED WITH YOUR NAUSEA MEDICATION *UNUSUAL SHORTNESS OF BREATH *UNUSUAL BRUISING OR BLEEDING *URINARY PROBLEMS (pain or burning when urinating, or frequent urination) *BOWEL PROBLEMS (unusual diarrhea, constipation, pain near the anus) TENDERNESS IN MOUTH AND THROAT WITH OR WITHOUT PRESENCE OF ULCERS (sore throat, sores in mouth, or a toothache) UNUSUAL RASH, SWELLING OR PAIN  UNUSUAL VAGINAL DISCHARGE OR ITCHING   Items with * indicate a potential emergency and should be followed up as soon as possible or go to the Emergency Department if any problems should occur.  Please show the CHEMOTHERAPY ALERT CARD or IMMUNOTHERAPY ALERT CARD at check-in to the Emergency Department and triage nurse.  Should you have questions after your visit or need to cancel  or reschedule your appointment, please contact [facility] [phone]  and follow the prompts.  Office hours are 8:00 a.m. to 4:30 p.m. Monday - Friday. Please note that voicemails left after 4:00 p.m. may not be returned until the following business day.  We are closed weekends and major holidays. You have access to a nurse at all times for urgent questions. Please call the main number to the clinic [phone] and follow the prompts.  For any non-urgent questions, you may also contact your provider using MyChart. We now offer e-Visits for anyone 18 and older to request care online for non-urgent symptoms. For details visit [email or web address].   Also download the MyChart app! Go to the app store, search "MyChart", open the app, select [facility], and log in with your MyChart username and password.  Due to Covid, a mask is required upon entering the hospital/clinic. If you do not have a mask, one will be given to you upon arrival. For doctor visits, patients may have 1 support person aged 18 or older with them. For treatment visits, patients cannot have anyone with them due to current Covid guidelines and our immunocompromised population.  

## 2021-06-06 NOTE — Progress Notes (Signed)
Patients port flushed without difficulty.  Good blood return noted with no bruising or swelling noted at site.  Stable during access and blood draw.  Band aid applied.  VSS with discharge and left in satisfactory condition with no s/s of distress noted.  Discharge from clinic ambulatory in stable condition.  Alert and oriented X 3.  Follow up with [facility] as scheduled.  

## 2021-06-06 NOTE — Assessment & Plan Note (Signed)
Her respiratory failure is stable She has good energy level She will continue oxygen therapy as directed

## 2021-06-13 ENCOUNTER — Inpatient Hospital Stay (HOSPITAL_COMMUNITY): Payer: Medicare HMO

## 2021-06-13 ENCOUNTER — Other Ambulatory Visit (HOSPITAL_COMMUNITY): Payer: Medicare HMO

## 2021-06-13 ENCOUNTER — Ambulatory Visit (HOSPITAL_COMMUNITY): Payer: Medicare HMO | Admitting: Hematology

## 2021-06-13 ENCOUNTER — Other Ambulatory Visit: Payer: Self-pay

## 2021-06-13 ENCOUNTER — Ambulatory Visit (HOSPITAL_COMMUNITY): Payer: Medicare HMO

## 2021-06-13 DIAGNOSIS — Z452 Encounter for adjustment and management of vascular access device: Secondary | ICD-10-CM | POA: Diagnosis not present

## 2021-06-13 DIAGNOSIS — C786 Secondary malignant neoplasm of retroperitoneum and peritoneum: Secondary | ICD-10-CM

## 2021-06-13 LAB — COMPREHENSIVE METABOLIC PANEL
ALT: 28 U/L (ref 0–44)
AST: 42 U/L — ABNORMAL HIGH (ref 15–41)
Albumin: 4.1 g/dL (ref 3.5–5.0)
Alkaline Phosphatase: 70 U/L (ref 38–126)
Anion gap: 8 (ref 5–15)
BUN: 12 mg/dL (ref 8–23)
CO2: 31 mmol/L (ref 22–32)
Calcium: 8.5 mg/dL — ABNORMAL LOW (ref 8.9–10.3)
Chloride: 94 mmol/L — ABNORMAL LOW (ref 98–111)
Creatinine, Ser: 0.69 mg/dL (ref 0.44–1.00)
GFR, Estimated: >60 mL/min (ref >60)
Glucose, Bld: 104 mg/dL — ABNORMAL HIGH (ref 70–99)
Potassium: 3.4 mmol/L — ABNORMAL LOW (ref 3.5–5.1)
Sodium: 133 mmol/L — ABNORMAL LOW (ref 135–145)
Total Bilirubin: 0.5 mg/dL (ref 0.3–1.2)
Total Protein: 6.8 g/dL (ref 6.5–8.1)

## 2021-06-13 LAB — CBC WITH DIFFERENTIAL/PLATELET
Abs Immature Granulocytes: 0.01 10*3/uL (ref 0.00–0.07)
Basophils Absolute: 0.1 10*3/uL (ref 0.0–0.1)
Basophils Relative: 1 %
Eosinophils Absolute: 0.1 10*3/uL (ref 0.0–0.5)
Eosinophils Relative: 2 %
HCT: 32 % — ABNORMAL LOW (ref 36.0–46.0)
Hemoglobin: 10.3 g/dL — ABNORMAL LOW (ref 12.0–15.0)
Immature Granulocytes: 0 %
Lymphocytes Relative: 15 %
Lymphs Abs: 1 10*3/uL (ref 0.7–4.0)
MCH: 33.7 pg (ref 26.0–34.0)
MCHC: 32.2 g/dL (ref 30.0–36.0)
MCV: 104.6 fL — ABNORMAL HIGH (ref 80.0–100.0)
Monocytes Absolute: 0.5 10*3/uL (ref 0.1–1.0)
Monocytes Relative: 8 %
Neutro Abs: 5 10*3/uL (ref 1.7–7.7)
Neutrophils Relative %: 74 %
Platelets: 128 10*3/uL — ABNORMAL LOW (ref 150–400)
RBC: 3.06 MIL/uL — ABNORMAL LOW (ref 3.87–5.11)
RDW: 14.2 % (ref 11.5–15.5)
WBC: 6.6 10*3/uL (ref 4.0–10.5)
nRBC: 0 % (ref 0.0–0.2)

## 2021-06-15 ENCOUNTER — Ambulatory Visit (HOSPITAL_COMMUNITY): Payer: Medicare HMO

## 2021-06-19 ENCOUNTER — Other Ambulatory Visit (HOSPITAL_COMMUNITY): Payer: Self-pay

## 2021-06-19 IMAGING — CT CT ABD-PELV W/ CM
2 of 5 series · 16 of 46 positions shown, 18 images · IV contrast (Omnipaque or Isovue)
Comparison: CT abdomen and pelvis 05/30/2019.

CLINICAL DATA: Lower abdominal pain and shortness of breath 2 days.
History of metastatic ovarian carcinoma.

EXAM:
CT ABDOMEN AND PELVIS WITH CONTRAST
TECHNIQUE: Multidetector CT imaging of the abdomen and pelvis was performed
using the standard protocol following bolus administration of
intravenous contrast.
CONTRAST:  75 mL OMNIPAQUE IOHEXOL 300 MG/ML  SOLN

[Series 2: axial st · axial · 0.76mm/px · z∈[+1007,+1372]mm · 13 of 85 slices shown, 15 images]
[im 6/85  soft-tissue]
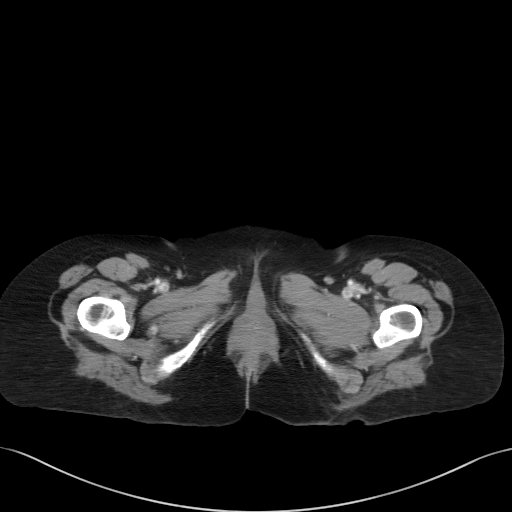
[im 6/85  bone]
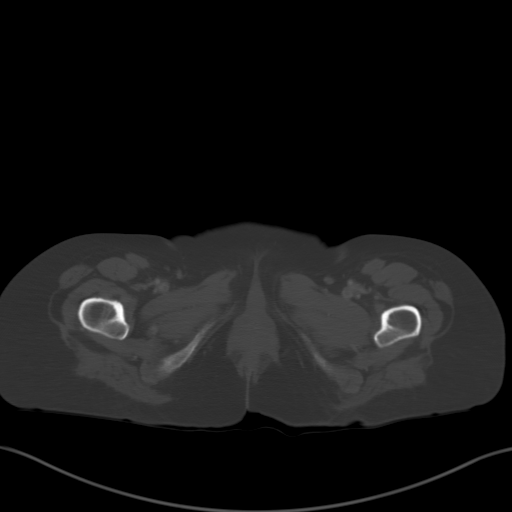
[im 11/85  soft-tissue]
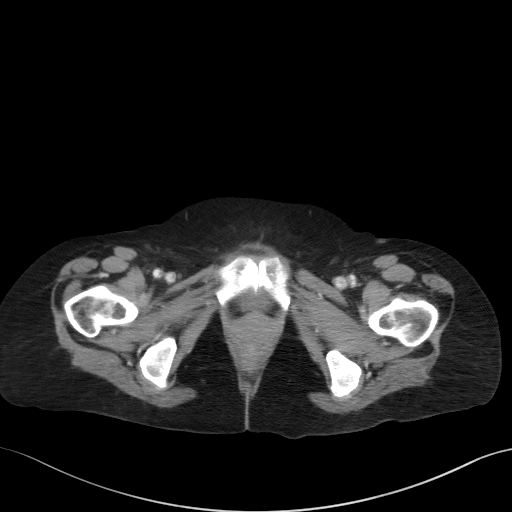
[im 16/85  soft-tissue]
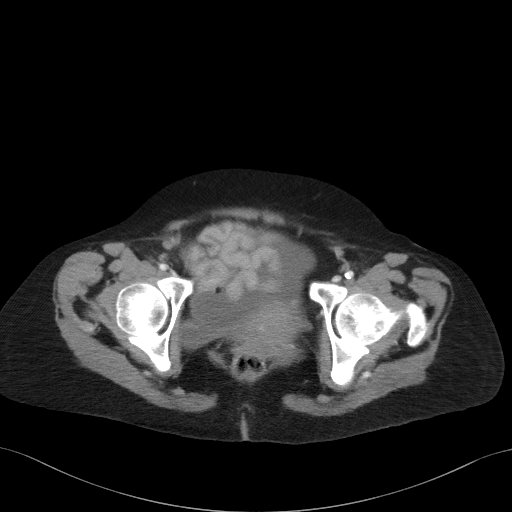
[im 27/85  soft-tissue]
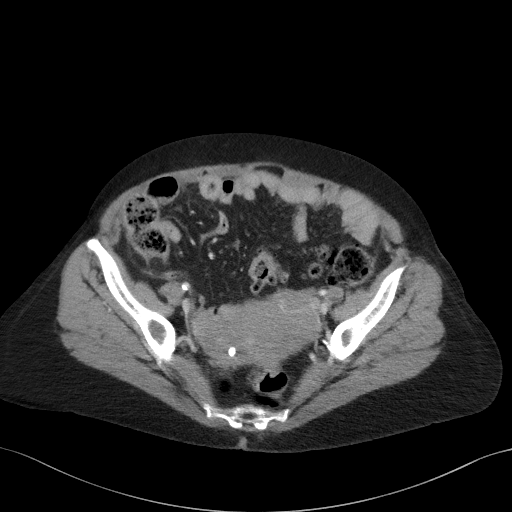
[im 32/85  soft-tissue]
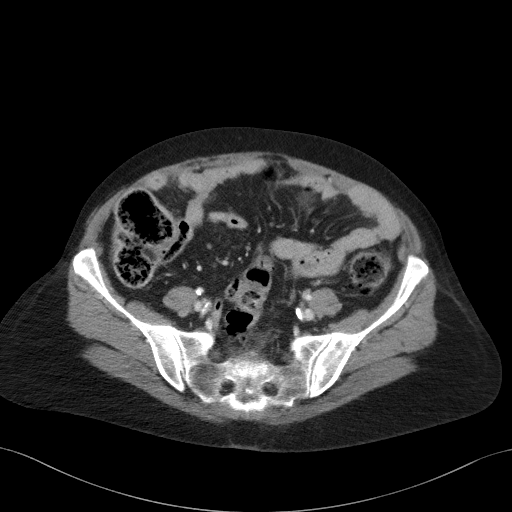
[im 37/85  soft-tissue]
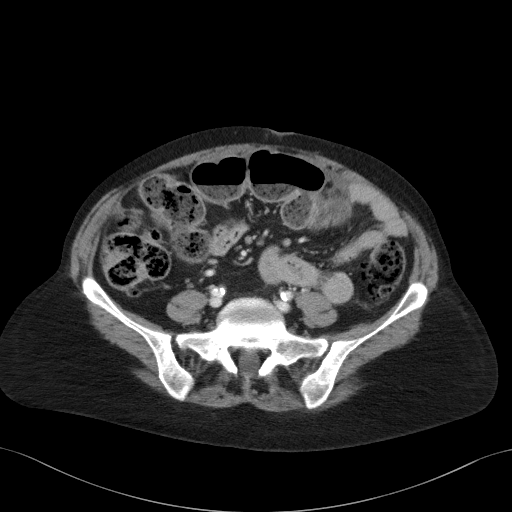
[im 43/85  soft-tissue]
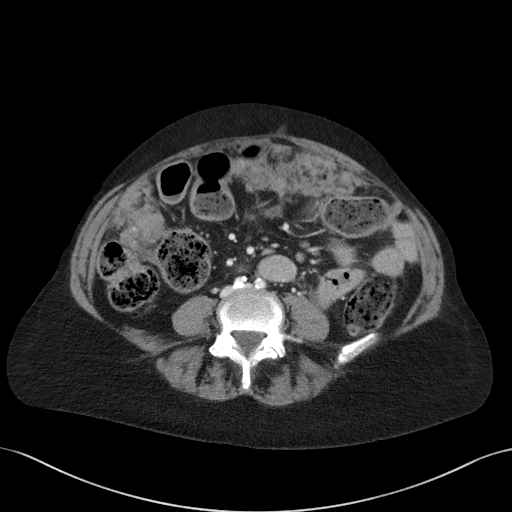
[im 48/85  soft-tissue]
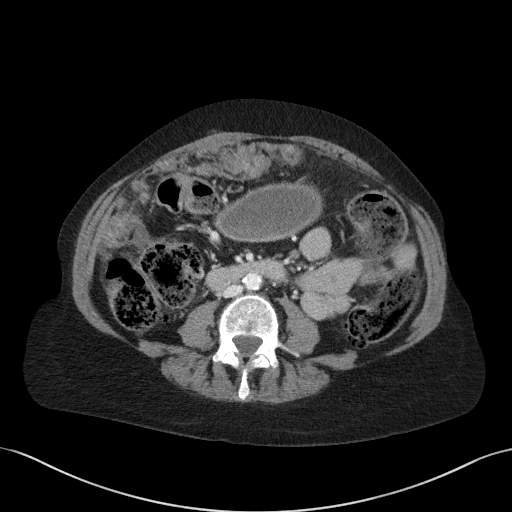
[im 53/85  soft-tissue]
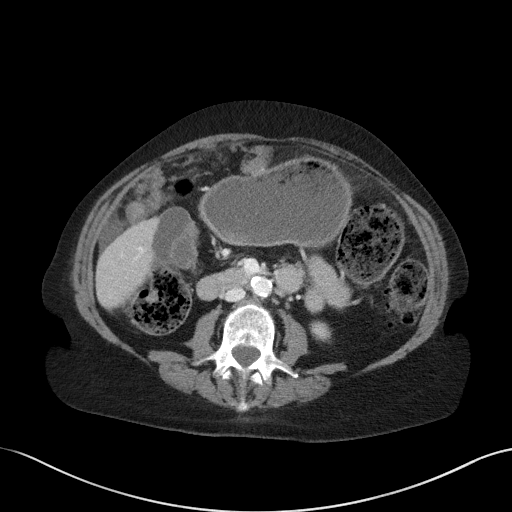
[im 53/85  bone]
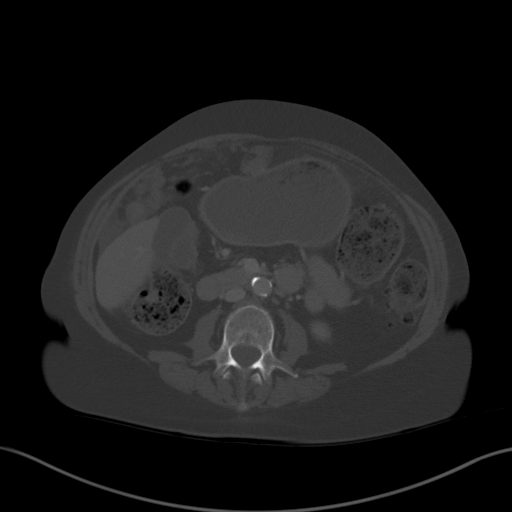
[im 58/85  soft-tissue]
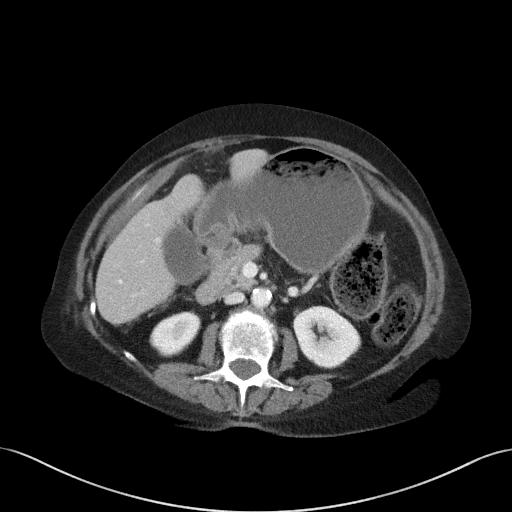
[im 69/85  soft-tissue]
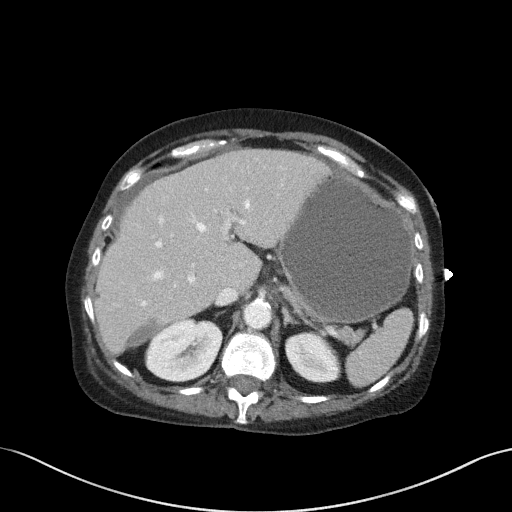
[im 74/85  soft-tissue]
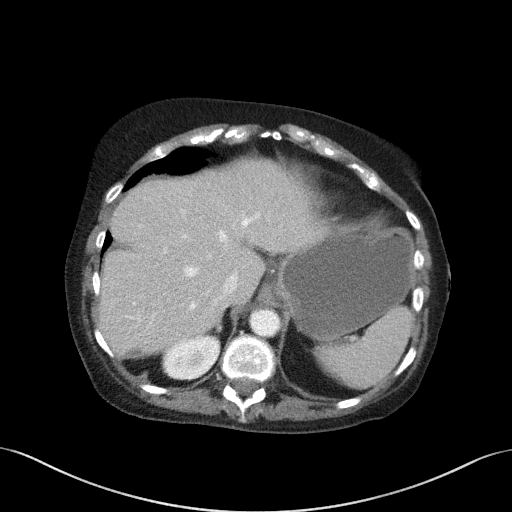
[im 79/85  soft-tissue]
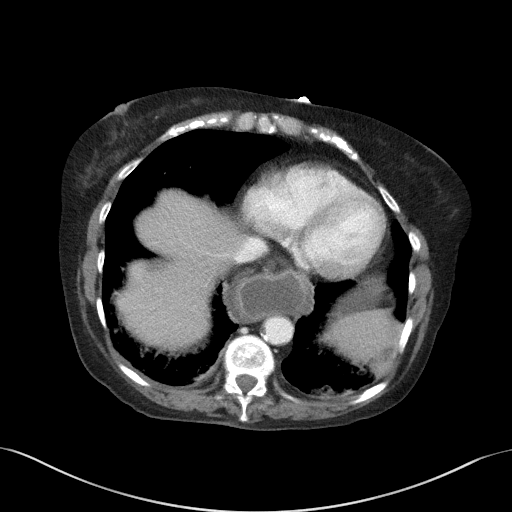

[Series 6: coronal st · coronal · 0.78mm/px · 3 of 97 slices shown]
[im 33/97  soft-tissue]
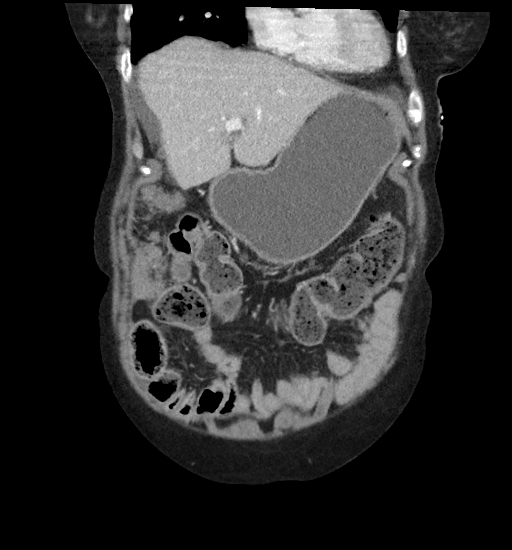
[im 43/97  soft-tissue]
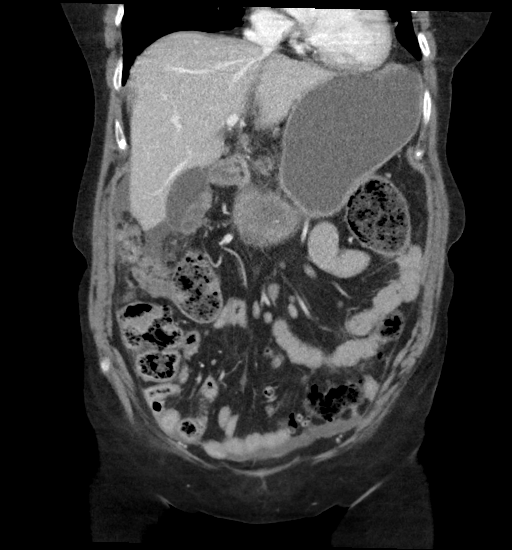
[im 54/97  soft-tissue]
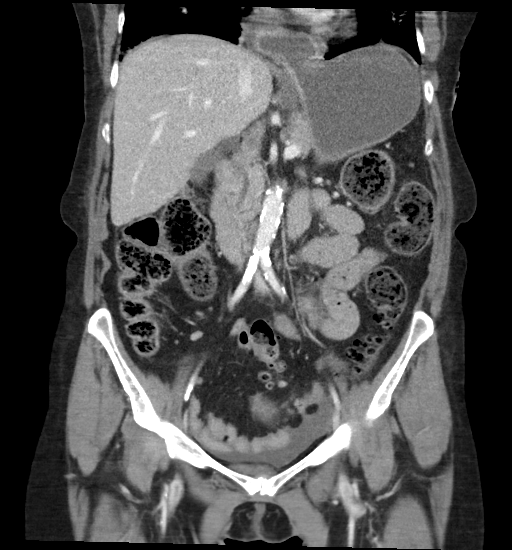

[16 of 46 positions shown; findings below may reference images not displayed]

FINDINGS: Lower chest: Trace pleural effusions. Extensive emphysematous
disease in the lung bases. Mild dependent atelectasis.

Hepatobiliary: Likely subcapsular cyst along the posterior right
hepatic lobe is unchanged. No focal liver lesion or intrahepatic
biliary ductal dilatation. No evidence of cholecystitis or
gallstones.

Pancreas: Unremarkable. No pancreatic ductal dilatation or
surrounding inflammatory changes.

Spleen: Normal in size. A few calcifications are seen in the spleen.

Adrenals/Urinary Tract: Adrenal glands are unremarkable. Kidneys are
normal, without renal calculi, focal lesion, or hydronephrosis.
Bladder is unremarkable.

Stomach/Bowel: Small to moderate hiatal hernia contains fluid
suggestive of esophageal dysmotility or gastroesophageal reflux.
Walls of the distal esophagus are thickened. Appendix appears
normal. No evidence of bowel wall thickening, distention, or
inflammatory changes. Diverticulosis noted.

Vascular/Lymphatic: Mildly prominent mesenteric lymph nodes are
again seen and not notably changed. Extensive atherosclerosis again
seen.

Reproductive: Fibroid uterus again seen. The ovaries are difficult
to discretely visualize.

Other: Small volume of abdominal and pelvic ascites is seen.
Extensive omental tumor and a tumor implant along the medial wall of
the gallbladder do not appear grossly changed.

Musculoskeletal: No acute or focal bony abnormality.
IMPRESSION: No acute abnormality within the abdomen or pelvis.

Small to moderate hiatal hernia contains fluid which could be due to
reflux or poor motility.

Thickening of the walls of the distal esophagus worrisome for
inflammatory change.

Extensive omental tumor and small volume of abdominal and pelvic
ascites do not appear markedly changed. Trace pleural effusions also
noted.

Diverticulosis without diverticulitis. Moderate to moderately large
volume of stool throughout the colon is seen.

Fibroid uterus.

Aortic Atherosclerosis (L3TOV-QHE.E) and Emphysema (L3TOV-21C.I).

## 2021-06-20 ENCOUNTER — Inpatient Hospital Stay (HOSPITAL_BASED_OUTPATIENT_CLINIC_OR_DEPARTMENT_OTHER): Payer: Medicare HMO | Admitting: Nurse Practitioner

## 2021-06-20 ENCOUNTER — Inpatient Hospital Stay (HOSPITAL_COMMUNITY): Payer: Medicare HMO | Attending: Hematology

## 2021-06-20 ENCOUNTER — Other Ambulatory Visit: Payer: Self-pay

## 2021-06-20 ENCOUNTER — Other Ambulatory Visit (HOSPITAL_COMMUNITY): Payer: Medicare HMO

## 2021-06-20 ENCOUNTER — Other Ambulatory Visit (HOSPITAL_COMMUNITY): Payer: Self-pay

## 2021-06-20 VITALS — BP 144/71 | HR 93 | Temp 96.8°F | Resp 18 | Wt 117.7 lb

## 2021-06-20 DIAGNOSIS — Z7952 Long term (current) use of systemic steroids: Secondary | ICD-10-CM | POA: Diagnosis not present

## 2021-06-20 DIAGNOSIS — Z9981 Dependence on supplemental oxygen: Secondary | ICD-10-CM | POA: Insufficient documentation

## 2021-06-20 DIAGNOSIS — C786 Secondary malignant neoplasm of retroperitoneum and peritoneum: Secondary | ICD-10-CM | POA: Diagnosis not present

## 2021-06-20 DIAGNOSIS — J9610 Chronic respiratory failure, unspecified whether with hypoxia or hypercapnia: Secondary | ICD-10-CM | POA: Insufficient documentation

## 2021-06-20 DIAGNOSIS — I10 Essential (primary) hypertension: Secondary | ICD-10-CM | POA: Insufficient documentation

## 2021-06-20 DIAGNOSIS — D649 Anemia, unspecified: Secondary | ICD-10-CM | POA: Insufficient documentation

## 2021-06-20 DIAGNOSIS — Z79899 Other long term (current) drug therapy: Secondary | ICD-10-CM | POA: Insufficient documentation

## 2021-06-20 DIAGNOSIS — C569 Malignant neoplasm of unspecified ovary: Secondary | ICD-10-CM

## 2021-06-20 DIAGNOSIS — D7589 Other specified diseases of blood and blood-forming organs: Secondary | ICD-10-CM | POA: Diagnosis not present

## 2021-06-20 DIAGNOSIS — E7800 Pure hypercholesterolemia, unspecified: Secondary | ICD-10-CM | POA: Insufficient documentation

## 2021-06-20 DIAGNOSIS — Z7982 Long term (current) use of aspirin: Secondary | ICD-10-CM | POA: Insufficient documentation

## 2021-06-20 LAB — CBC WITH DIFFERENTIAL/PLATELET
Abs Immature Granulocytes: 0.01 10*3/uL (ref 0.00–0.07)
Basophils Absolute: 0 10*3/uL (ref 0.0–0.1)
Basophils Relative: 1 %
Eosinophils Absolute: 0.1 10*3/uL (ref 0.0–0.5)
Eosinophils Relative: 3 %
HCT: 30.6 % — ABNORMAL LOW (ref 36.0–46.0)
Hemoglobin: 10.1 g/dL — ABNORMAL LOW (ref 12.0–15.0)
Immature Granulocytes: 0 %
Lymphocytes Relative: 29 %
Lymphs Abs: 1.2 10*3/uL (ref 0.7–4.0)
MCH: 33.9 pg (ref 26.0–34.0)
MCHC: 33 g/dL (ref 30.0–36.0)
MCV: 102.7 fL — ABNORMAL HIGH (ref 80.0–100.0)
Monocytes Absolute: 0.4 10*3/uL (ref 0.1–1.0)
Monocytes Relative: 9 %
Neutro Abs: 2.4 10*3/uL (ref 1.7–7.7)
Neutrophils Relative %: 58 %
Platelets: 54 10*3/uL — ABNORMAL LOW (ref 150–400)
RBC: 2.98 MIL/uL — ABNORMAL LOW (ref 3.87–5.11)
RDW: 13.9 % (ref 11.5–15.5)
WBC: 4.2 10*3/uL (ref 4.0–10.5)
nRBC: 0 % (ref 0.0–0.2)

## 2021-06-20 LAB — COMPREHENSIVE METABOLIC PANEL
ALT: 32 U/L (ref 0–44)
AST: 37 U/L (ref 15–41)
Albumin: 4 g/dL (ref 3.5–5.0)
Alkaline Phosphatase: 69 U/L (ref 38–126)
Anion gap: 11 (ref 5–15)
BUN: 9 mg/dL (ref 8–23)
CO2: 32 mmol/L (ref 22–32)
Calcium: 9 mg/dL (ref 8.9–10.3)
Chloride: 95 mmol/L — ABNORMAL LOW (ref 98–111)
Creatinine, Ser: 0.64 mg/dL (ref 0.44–1.00)
GFR, Estimated: >60 mL/min (ref >60)
Glucose, Bld: 103 mg/dL — ABNORMAL HIGH (ref 70–99)
Potassium: 3.7 mmol/L (ref 3.5–5.1)
Sodium: 138 mmol/L (ref 135–145)
Total Bilirubin: 0.5 mg/dL (ref 0.3–1.2)
Total Protein: 6.9 g/dL (ref 6.5–8.1)

## 2021-06-20 LAB — VITAMIN B12: Vitamin B-12: 610 pg/mL (ref 180–914)

## 2021-06-20 LAB — FOLATE: Folate: 10.9 ng/mL (ref >5.9)

## 2021-06-20 MED ORDER — MAGNESIUM OXIDE 400 MG PO TABS: 400.0000 mg | ORAL_TABLET | Freq: Two times a day (BID) | ORAL | 0 refills | Status: DC

## 2021-06-20 NOTE — Progress Notes (Signed)
[place] OFFICE PROGRESS NOTE  Patient Care Team: [name], MD as PCP - General (Family Medicine)  Virtual Visit Progress Note  I connected with [name] on 06/20/21 at  1:30 PM EDT by video enabled telemedicine visit and verified that I am speaking with the correct person using two identifiers.   I discussed the limitations, risks, security and privacy concerns of performing an evaluation and management service by telemedicine and the availability of in-person appointments. I also discussed with the patient that there may be a patient responsible charge related to this service. The patient expressed understanding and agreed to proceed.   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 The CancerNext-Expanded gene panel offered by [facility] and includes sequencing and rearrangement analysis for the following 77 genes: AIP, ALK, APC*, ATM*, AXIN2, BAP1, BARD1, BLM, BMPR1A, BRCA1*, BRCA2*, BRIP1*, CDC73, CDH1*, CDK4, CDKN1B, CDKN2A, CHEK2*, CTNNA1, DICER1, FANCC, FH, FLCN, GALNT12, KIF1B, LZTR1, MAX, MEN1, MET, MLH1*, MSH2*, MSH3, MSH6*, MUTYH*, NBN, NF1*, NF2, NTHL1, PALB2*, PHOX2B, PMS2*, POT1, PRKAR1A, PTCH1, PTEN*, RAD51C*, RAD51D*, RB1, RECQL, RET, SDHA, SDHAF2, SDHB, SDHC, SDHD, SMAD4, SMARCA4, SMARCB1, SMARCE1, STK11, SUFU, TMEM127, TP53*, TSC1, TSC2, VHL and XRCC2 (sequencing and  deletion/duplication); EGFR, EGLN1, HOXB13, KIT, MITF, PDGFRA, POLD1, and POLE (sequencing only); EPCAM and GREM1 (deletion/duplication only). DNA and RNA analyses performed for * genes. The report date is July 22, 2020.    Ovarian cancer ([place])  07/07/2020 Initial Diagnosis   Ovarian cancer ([place])   05/04/2021 Imaging   1. No findings today to suggest new or progressive disease related to ovarian cancer. 2. Interval near complete resolution of the irregular and nodular areas of consolidative left upper lobe opacity seen on the previous study. Residual architectural distortion in this region is compatible with scarring. Tiny nodules persist in the left upper lobe and attention on follow-up recommended. 3. Interval improvement in tree-in-bud nodularity anteromedial left upper lobe. Changes of bronchial wall thickening and mild cylindrical bronchiectasis in the lower lungs are similar to prior with persistent tree-in-bud nodularity posterior left lower lobe. Imaging features suggest sequelae of atypical infection. 4. Similar appearance of the omental soft tissue stranding with less prominent nodularity on today's study. 5. No ascites. 6. Mild circumferential wall thickening distal esophagus. Esophagitis could have this appearance. 7. Left colonic diverticulosis without diverticulitis. 8. Aortic Atherosclerosis (ICD10-I70.0) and Emphysema (ICD10-J43.9).   05/30/2021 -  Chemotherapy   She takes niraparib        INTERVAL HISTORY: Patient is 65 year old female with above history of peritoneal carcinomatosis, currently on niraparib, who returns to clinic for follow up. She has mild right lung pain rated 4 of 10, intermittent. No vaginal bleeding, abdominal pain, fullness. No changes in bowel movements or leg swelling. She feels well.    General: negative for fevers, changes in weight or night sweats Skin: negative for changes in moles or sores or rash Eyes:  negative for changes in  vision HEENT: negative for change in hearing, tinnitus, voice changes Pulmonary: negative for dyspnea, orthopnea, productive cough, wheezing Cardiac: negative for palpitations, pain Gastrointestinal: negative for nausea, vomiting, constipation, diarrhea, hematemesis, hematochezia Genitourinary/Sexual: negative for dysuria, retention, hematuria, incontinence Ob/Gyn:  negative for abnormal bleeding, or pain Musculoskeletal: negative for pain, joint pain, back pain Hematology: negative for easy bruising, abnormal bleeding Neurologic/Psych: negative for headaches, seizures, paralysis, weakness, numbness   I have reviewed the past medical history, past surgical history, social history and family history with the patient and they are unchanged from previous note.  ALLERGIES:  has No Known Allergies.  MEDICATIONS:  Current Outpatient Medications  Medication Sig Dispense Refill   acetylcysteine (MUCOMYST) 20 % nebulizer solution Inhale 4 mLs into the lungs every 6 (six) hours as needed (COPD).     albuterol (PROVENTIL HFA;VENTOLIN HFA) 108 (90 BASE) MCG/ACT inhaler Inhale 4-6 puffs into the lungs every 6 (six) hours as needed for wheezing or shortness of breath.     amLODipine (NORVASC) 5 MG tablet Take 5 mg by mouth at bedtime.      aspirin EC 81 MG tablet Take 81 mg by mouth daily.     CARBOPLATIN IV Inject into the vein every 21 ( twenty-one) days.     ferrous sulfate 325 (65 FE) MG tablet Take 325 mg by mouth every other day.     Fluticasone-Umeclidin-Vilant (TRELEGY ELLIPTA) 100-62.5-25 MCG/INH AEPB Inhale 1 puff into the lungs 2 (two) times daily.     gabapentin (NEURONTIN) 600 MG tablet Take 600 mg by mouth 2 (two) times daily.     ipratropium (ATROVENT HFA) 17 MCG/ACT inhaler Inhale 2 puffs into the lungs every 6 (six) hours as needed for wheezing.     ipratropium-albuterol (DUONEB) 0.5-2.5 (3) MG/3ML SOLN Take 0.5 mg by nebulization every 6 (six) hours as needed (COPD).     levofloxacin  (LEVAQUIN) 500 MG tablet Take 500 mg by mouth daily.     magnesium oxide (MAG-OX) 400 (241.3 Mg) MG tablet Take 1 tablet (400 mg total) by mouth 2 (two) times daily. 60 tablet 3   magnesium oxide (MAG-OX) 400 MG tablet Take 1 tablet by mouth 2 (two) times daily.     niraparib tosylate (ZEJULA) 100 MG capsule Take 2 capsules (200 mg total) by mouth daily. May take at bedtime to reduce nausea and vomiting. 60 capsule 11   nortriptyline (PAMELOR) 25 MG capsule Take 25 mg by mouth at bedtime.     ondansetron (ZOFRAN ODT) 4 MG disintegrating tablet 44m ODT q4 hours prn nausea/vomit (Patient not taking: Reported on 06/06/2021) 12 tablet 0   ondansetron (ZOFRAN) 8 MG tablet Take 1 tablet (8 mg total) by mouth every 8 (eight) hours as needed for nausea. (Patient not taking: Reported on 06/06/2021) 30 tablet 3   OXYGEN Inhale 3 L into the lungs continuous.      PACLITAXEL IV Inject into the vein every 21 ( twenty-one) days.     pantoprazole (PROTONIX) 40 MG tablet Take 1 tablet (40 mg total) by mouth daily. 30 tablet 2   predniSONE (DELTASONE) 5 MG tablet Take 5 mg by mouth daily with breakfast.     simvastatin (ZOCOR) 40 MG tablet Take 40 mg by mouth at bedtime.     No current facility-administered medications for this visit.   Past Medical History:  Diagnosis Date   Anemia    Aortic atherosclerosis ([place]    Arthritis    Asthma  Bilateral carotid artery stenosis    Cancer (HCC)    Gastric Cancer   COPD (chronic obstructive pulmonary disease) (HCC)    High cholesterol    Hyperlipidemia    Hypertension    Neuropathy    Osteoarthritis    Ovarian cancer ([place])    Oxygen dependent    Peripheral vascular disease ([place])    Peritoneal carcinoma ([place])    Pneumonia 2019   Port-A-Cath in place 07/03/2020   Pulmonary emphysema ([place])    Past Surgical History:  Procedure Laterality Date   ESOPHAGOGASTRODUODENOSCOPY (EGD) WITH PROPOFOL N/A 02/17/2021   Procedure: ESOPHAGOGASTRODUODENOSCOPY (EGD) WITH  PROPOFOL;  Surgeon: Harvel Quale, MD;  [facility];  Service: Gastroenterology;  Laterality: N/A;   HALLUX VALGUS BASE WEDGE Right 06/09/2015   Procedure: Base wedge osteotomy with modified McBride right foot ;  Surgeon: Sharlotte Alamo, MD;  Location: [facility];  Service: Podiatry;  Laterality: Right;   PORTACATH PLACEMENT Left 06/28/2020   Procedure: PORT-A-CATHETER PLACEMENT LEFT CHEST (attached catheter in left subclavian);  Surgeon: Virl Cagey, MD;  Location: [facility];  Service: General;  Laterality: Left;   TUBAL LIGATION     VIDEO BRONCHOSCOPY WITH ENDOBRONCHIAL NAVIGATION N/A 03/09/2021   Procedure: VIDEO BRONCHOSCOPY WITH ENDOBRONCHIAL NAVIGATION;  Surgeon: Ottie Glazier, MD;  Location: [facility];  Service: Thoracic;  Laterality: N/A;   VIDEO BRONCHOSCOPY WITH ENDOBRONCHIAL ULTRASOUND N/A 03/09/2021   Procedure: VIDEO BRONCHOSCOPY WITH ENDOBRONCHIAL ULTRASOUND;  Surgeon: Ottie Glazier, MD;  Location: [facility];  Service: Thoracic;  Laterality: N/A;     PHYSICAL EXAMINATION: ECOG PERFORMANCE STATUS: 1 - Symptomatic but completely ambulatory  Physical Exam Vitals reviewed.  Constitutional:      Appearance: She is not ill-appearing.  Pulmonary:     Effort: No respiratory distress.     Comments: On oxygen via nasal cannula Neurological:     Mental Status: She is alert and oriented to person, place, and time.  Psychiatric:        Mood and Affect: Mood normal.        Behavior: Behavior normal.     LABORATORY DATA:  I have reviewed the data as listed    Component Value Date/Time   NA 138 06/20/2021 1214   NA 139 11/07/2014 0018   K 3.7 06/20/2021 1214   K 3.6 11/07/2014 0018   CL 95 (L) 06/20/2021 1214   CL 102 11/07/2014 0018   CO2 32 06/20/2021 1214   CO2 29 11/07/2014 0018   GLUCOSE 103 (H) 06/20/2021 1214   GLUCOSE 108 (H) 11/07/2014 0018   BUN 9 06/20/2021 1214   BUN 6 (L) 11/07/2014 0018   CREATININE 0.64 06/20/2021 1214   CREATININE 0.65  11/07/2014 0018   CALCIUM 9.0 06/20/2021 1214   CALCIUM 9.4 11/07/2014 0018   PROT 6.9 06/20/2021 1214   PROT 6.9 10/18/2014 1210   ALBUMIN 4.0 06/20/2021 1214   ALBUMIN 3.9 10/18/2014 1210   AST 37 06/20/2021 1214   AST 24 10/18/2014 1210   ALT 32 06/20/2021 1214   ALT 19 10/18/2014 1210   ALKPHOS 69 06/20/2021 1214   ALKPHOS 98 10/18/2014 1210   BILITOT 0.5 06/20/2021 1214   BILITOT 0.3 10/18/2014 1210   GFRNONAA >60 06/20/2021 1214   GFRNONAA >60 11/07/2014 0018   GFRNONAA >60 07/19/2014 1615   GFRAA >60 08/16/2020 0804   GFRAA >60 11/07/2014 0018   GFRAA >60 07/19/2014 1615    No results found for: SPEP, UPEP  Lab Results  Component Value Date   WBC 4.2 06/20/2021   NEUTROABS 2.4 06/20/2021   HGB 10.1 (L) 06/20/2021   HCT 30.6 (L) 06/20/2021   MCV 102.7 (H) 06/20/2021   PLT 54 (L) 06/20/2021      Chemistry      Component Value Date/Time   NA 138 06/20/2021 1214   NA 139 11/07/2014 0018   K 3.7 06/20/2021 1214   K 3.6 11/07/2014 0018   CL 95 (L) 06/20/2021 1214   CL 102 11/07/2014 0018   CO2 32 06/20/2021 1214   CO2 29 11/07/2014 0018   BUN 9 06/20/2021 1214   BUN 6 (L) 11/07/2014 0018   CREATININE 0.64 06/20/2021 1214   CREATININE 0.65 11/07/2014 0018      Component Value Date/Time   CALCIUM 9.0 06/20/2021 1214   CALCIUM 9.4 11/07/2014 0018   ALKPHOS 69 06/20/2021 1214   ALKPHOS 98 10/18/2014 1210   AST 37 06/20/2021 1214   AST 24 10/18/2014 1210   ALT 32 06/20/2021 1214   ALT 19 10/18/2014 1210   BILITOT 0.5 06/20/2021 1214   BILITOT 0.3 10/18/2014 1210     ASSESSMENT & PLAN:  Ovarian cancer ([place]) with peritoneal carcinomatosis- s/p currently on niraparib since July 2022. Treatment given with palliative intent. She has not had debulking surgery due to lung disease. She received carbo-taxol chemotherapy. Negative germline and NGS testing.  June imaging was negative for new or progressive disease. Tolerating well without significant side effects. CA  125 pending today but recently around 28.7, normal. Continue niraparib until progressive disease or unacceptable toxicity. Plan for imaging in September 2022. Check ca 125 monthly.   Anemia- Likely related to treatment and chronic disease. No evidence of iron deficiency on July blood work. She does have some macrocytosis so I will  add on a folate and a b12 level today.  Chronic respiratory failure- related to hx of COPD. On oxygen chronically. Managed by pulmonology. Stable.   Hypomagnesemia- magnesium refilled today.  Disposition 1 month- ca125 2 month- ca 125 3 month- lab (ca125, cbc, cmp, mg) Few days later, Dr. Raliegh Ip for follow up    I discussed the assessment and treatment plan with the patient. The patient was provided an opportunity to ask questions and all were answered. The patient agreed with the plan and demonstrated an understanding of the instructions.   The patient was advised to call back or seek an in-person evaluation if the symptoms worsen or if the condition fails to improve as anticipated.   I spent 20 minutes face-to-face video visit time dedicated to the care of this patient on the date of this encounter to include pre-visit review of <medical oncology notes, labs, imaging, oncology history, face-to-face time with the patient, and post visit ordering of testing/documentation.     Verlon Au, NP

## 2021-06-21 LAB — CA 125: Cancer Antigen (CA) 125: 33.1 U/mL (ref 0.0–38.1)

## 2021-06-25 ENCOUNTER — Other Ambulatory Visit (HOSPITAL_COMMUNITY): Payer: Self-pay

## 2021-06-27 ENCOUNTER — Other Ambulatory Visit (HOSPITAL_COMMUNITY): Payer: Medicare HMO

## 2021-06-27 ENCOUNTER — Ambulatory Visit (HOSPITAL_COMMUNITY): Payer: Medicare HMO | Admitting: Hematology

## 2021-07-20 ENCOUNTER — Other Ambulatory Visit: Payer: Self-pay

## 2021-07-20 ENCOUNTER — Inpatient Hospital Stay (HOSPITAL_COMMUNITY): Payer: Medicare HMO | Attending: Hematology

## 2021-07-20 DIAGNOSIS — C569 Malignant neoplasm of unspecified ovary: Secondary | ICD-10-CM | POA: Diagnosis not present

## 2021-07-21 LAB — CA 125: Cancer Antigen (CA) 125: 32.6 U/mL (ref 0.0–38.1)

## 2021-07-27 ENCOUNTER — Other Ambulatory Visit (HOSPITAL_COMMUNITY): Payer: Self-pay

## 2021-07-30 ENCOUNTER — Other Ambulatory Visit (HOSPITAL_COMMUNITY): Payer: Self-pay

## 2021-08-06 ENCOUNTER — Encounter (HOSPITAL_COMMUNITY): Payer: Medicare HMO | Admitting: Dietician

## 2021-08-06 ENCOUNTER — Telehealth (HOSPITAL_COMMUNITY): Payer: Self-pay | Admitting: Dietician

## 2021-08-06 NOTE — Telephone Encounter (Signed)
Nutrition Follow-up:  Patient has completed chemotherapy for ovarian cancer.  Spoke with patient via telephone. She reports doing well, her appetite is good, says she is eating a bunch of junk. Patient reports gaining weight, says she is up to 120 lb on home scales. Yesterday she ate hamburger, french fries, tuna salad, macaroni salad. She has not been drinking CIB in the past 1-2 months.   Medications: reviewed  Labs: reviewed  Anthropometrics: Last weight 117 lb 11.6 oz on 8/3 stable  7/6 - 117 lb 12.8 oz 6/15 - 118 lb   NUTRITION DIAGNOSIS: Unintended weight loss stable   INTERVENTION:  Continue eating high calorie foods for weight maintenance Encouraged pt to drink CIB as needed with decreased appetite Patient has contact information   MONITORING, EVALUATION, GOAL: weight trends, intake   NEXT VISIT: No follow-up scheduled at this time

## 2021-08-13 IMAGING — CT CT CHEST-ABD-PELV W/ CM
2 of 5 series · 12 of 36 positions shown, 14 images · IV contrast (omnipaque)
Comparison: Multiple exams, including 07/08/2020 and 06/27/2020

CLINICAL DATA: Ovarian cancer restaging. Peritoneal carcinomatosis.

EXAM:
CT CHEST, ABDOMEN, AND PELVIS WITH CONTRAST
TECHNIQUE: Multidetector CT imaging of the chest, abdomen and pelvis was
performed following the standard protocol during bolus
administration of intravenous contrast.
CONTRAST:  80mL OMNIPAQUE IOHEXOL 300 MG/ML  SOLN

[Series 2: cap with · axial · 0.98mm/px · z∈[+894,+1414]mm · 9 of 132 slices shown, 11 images]
[im 14/132  mediastinal]
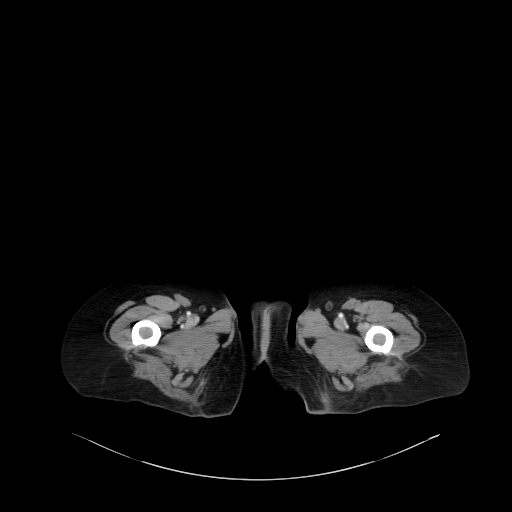
[im 14/132  bone]
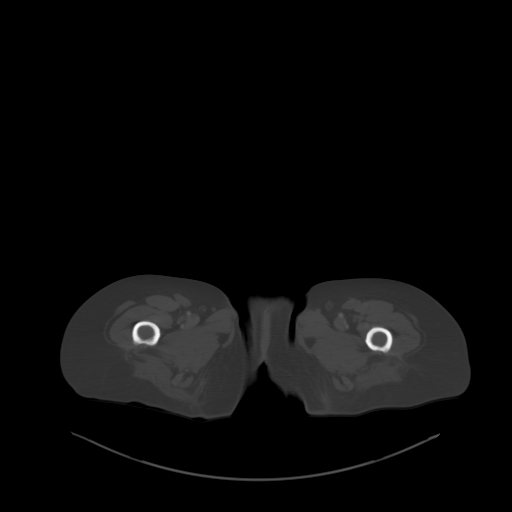
[im 27/132  mediastinal]
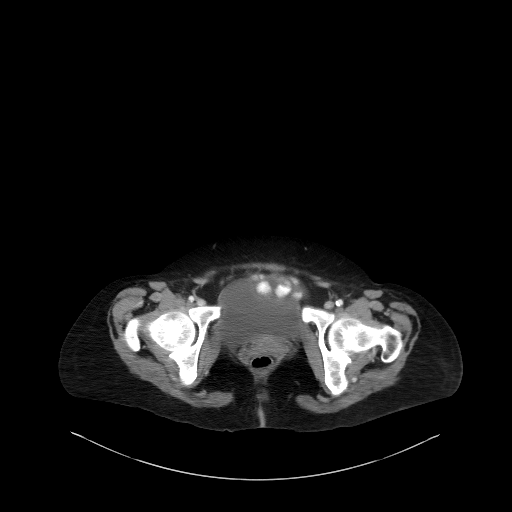
[im 40/132  mediastinal]
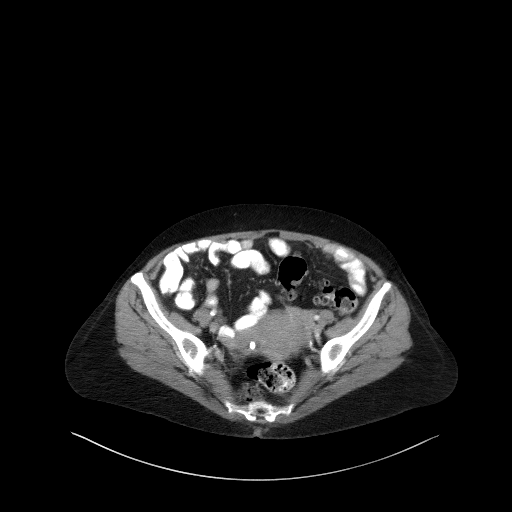
[im 53/132  mediastinal]
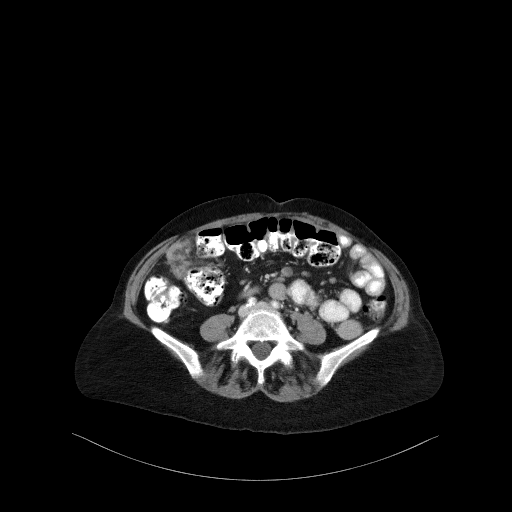
[im 66/132  mediastinal]
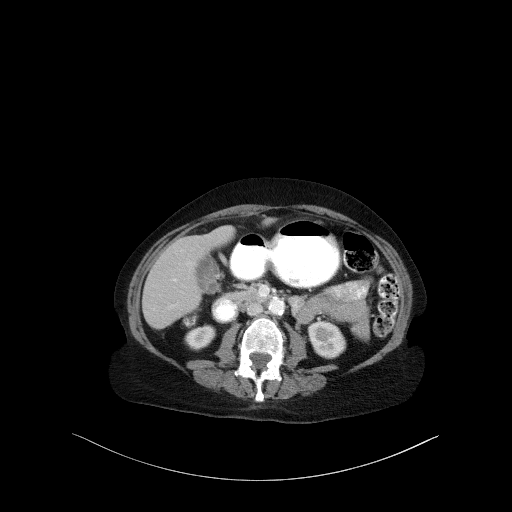
[im 79/132  mediastinal]
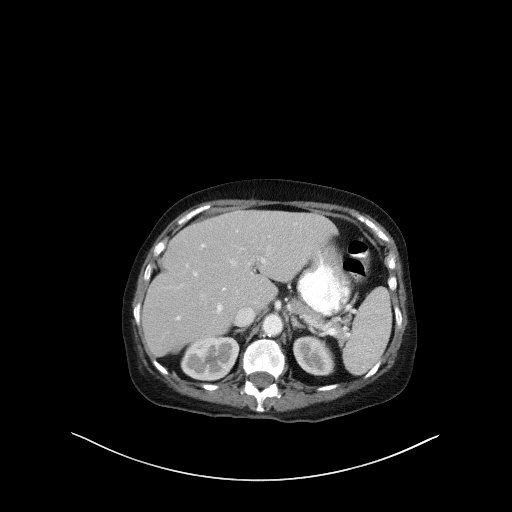
[im 92/132  mediastinal]
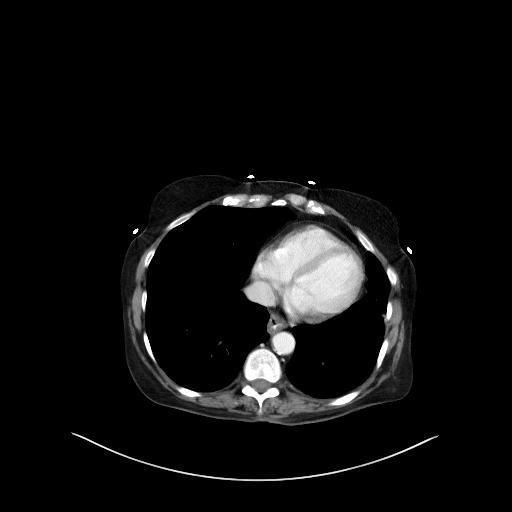
[im 105/132  mediastinal]
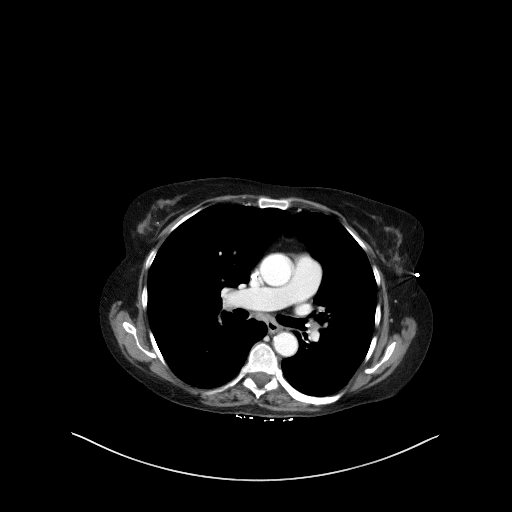
[im 118/132  mediastinal]
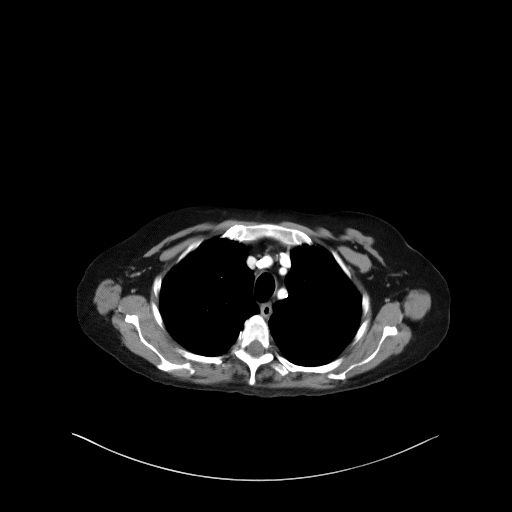
[im 118/132  bone]
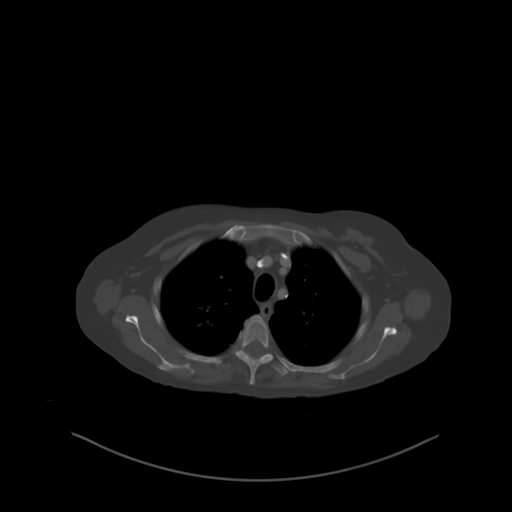

[Series 4: coronals · coronal · 0.74mm/px · 3 of 138 slices shown]
[im 28/138  mediastinal]
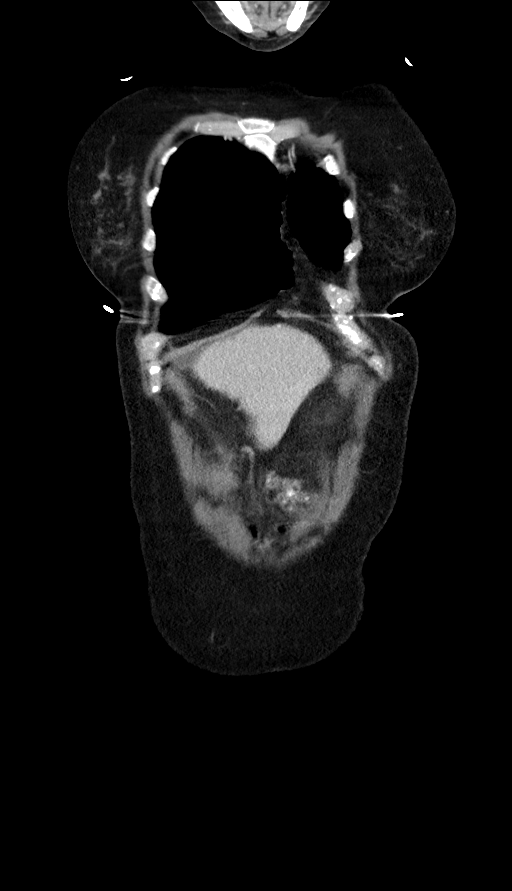
[im 55/138  mediastinal]
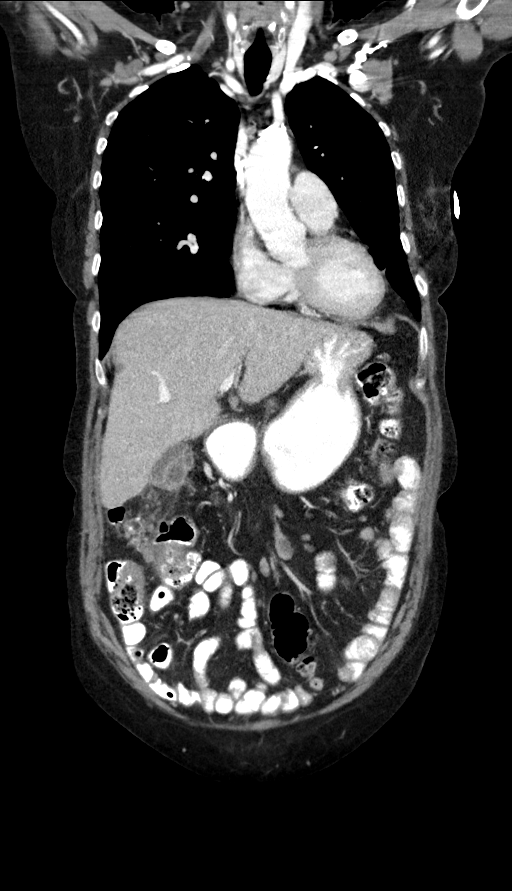
[im 83/138  mediastinal]
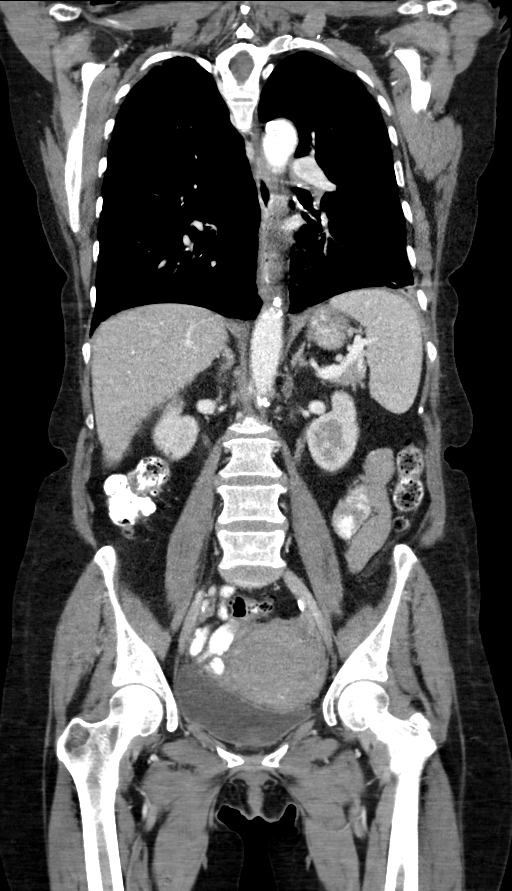

[12 of 36 positions shown; findings below may reference images not displayed]

FINDINGS: CT CHEST FINDINGS

Cardiovascular: Coronary, aortic arch, and branch vessel
atherosclerotic vascular disease. Left Port-A-Cath tip: SVC.

Mediastinum/Nodes: Left supraclavicular node 0.6 cm in short axis on
image 10 of series 2, stable. Right lower paratracheal node 1.2 cm
in short axis on image 24 series 2, stable. Right hilar lymph node
1.0 cm in short axis on image 28 of series 2, stable. Right
eccentric subcarinal lymph node is improved, currently 1.0 cm in
diameter and previously 1.3 cm. Small type 1 hiatal hernia.

Lungs/Pleura: Centrilobular emphysema. New 4 mm right upper lobe
lung nodule on image 55 of series 3. Airway thickening is present,
suggesting bronchitis or reactive airways disease. Mild scarring in
both lung apices. Lingular scarring. Scarring or atelectasis
laterally in the left lower lobe and along the left hemidiaphragm,
with airway plugging in the left lower lobe.

Musculoskeletal: Old left rib deformities from healed fractures.

CT ABDOMEN PELVIS FINDINGS

Hepatobiliary: Complex appearance of the gallbladder likely from
tumor deposition along the gallbladder wall, similar to prior. Fluid
density in thickening along the posterior capsular margin of the
right hepatic lobe measuring up to 0.7 cm in thickness on image 57
of series 2, previously up to 1.2 cm in thickness. No biliary
dilatation. 0.4 cm hypodense lesion in the periphery of the right
hepatic lobe on image 57 of series 2 is stable and potentially a
cyst.

Pancreas: Unremarkable

Spleen: Unremarkable

Adrenals/Urinary Tract: Unremarkable

Stomach/Bowel: Sigmoid colon diverticulosis.

Vascular/Lymphatic: Aortoiliac atherosclerotic vascular disease.
Peripancreatic node 1.0 cm in short axis on image 61 of series 2,
formerly 1.2 cm. Right gastric node 0.8 cm in short axis on image 52
of series 2, formerly the same. Peripancreatic node 1.1 cm in short
axis on image 58 of series 2, previously 1.0 cm. Portacaval node
cm in short axis on image 60 of series 2, previously 1.3 cm.
Scattered enlarged mesenteric lymph nodes are present including a
1.0 cm mesenteric node on image 82 of series 2, formerly 0.9 cm.

Reproductive: Uterine fibroids. Indistinctness of tissue planes
along the ovarian margins of the uterus, somewhat obscuring the
ovaries, although with subjectively reduced prominence of the adnexa
compared to the prior exam.

Other: Omental caking of tumor. This measures up to about 3.1 cm in
thickness on image 76 of series 2, previously 3.7 cm in thickness.
Subjectively the omental caking of tumor is mildly reduced.

Musculoskeletal: Unremarkable
IMPRESSION: 1. Overall mild improvement, with mild reduction in omental caking
of tumor. Stable to minimally reduced mild adenopathy in the chest
and abdomen.
2. New 4 mm right upper lobe lung nodule on image 55 of series 3.
This could be inflammatory or neoplastic, surveillance suggested.
3. Other imaging findings of potential clinical significance:
Coronary atherosclerosis. Small type 1 hiatal hernia. Airway
thickening is present, suggesting bronchitis or reactive airways
disease. Sigmoid colon diverticulosis. Uterine fibroids. Suspected
tumor deposition along the gallbladder margin, similar to prior.
4. Emphysema and aortic atherosclerosis.

Aortic Atherosclerosis (78672-E84.4) and Emphysema (78672-542.4).

## 2021-08-16 ENCOUNTER — Other Ambulatory Visit (HOSPITAL_COMMUNITY): Payer: Self-pay

## 2021-08-16 DIAGNOSIS — C569 Malignant neoplasm of unspecified ovary: Secondary | ICD-10-CM

## 2021-08-20 ENCOUNTER — Inpatient Hospital Stay (HOSPITAL_COMMUNITY): Payer: Medicare HMO | Attending: Hematology

## 2021-08-20 ENCOUNTER — Other Ambulatory Visit: Payer: Self-pay

## 2021-08-20 DIAGNOSIS — C786 Secondary malignant neoplasm of retroperitoneum and peritoneum: Secondary | ICD-10-CM | POA: Insufficient documentation

## 2021-08-20 DIAGNOSIS — C569 Malignant neoplasm of unspecified ovary: Secondary | ICD-10-CM | POA: Insufficient documentation

## 2021-08-21 ENCOUNTER — Other Ambulatory Visit (HOSPITAL_COMMUNITY): Payer: Self-pay

## 2021-08-21 LAB — CA 125: Cancer Antigen (CA) 125: 29.5 U/mL (ref 0.0–38.1)

## 2021-08-24 ENCOUNTER — Other Ambulatory Visit (HOSPITAL_COMMUNITY): Payer: Self-pay | Admitting: Nurse Practitioner

## 2021-08-24 DIAGNOSIS — C569 Malignant neoplasm of unspecified ovary: Secondary | ICD-10-CM

## 2021-08-27 ENCOUNTER — Other Ambulatory Visit (HOSPITAL_COMMUNITY): Payer: Self-pay

## 2021-08-27 ENCOUNTER — Other Ambulatory Visit (HOSPITAL_COMMUNITY): Payer: Self-pay | Admitting: *Deleted

## 2021-08-27 MED ORDER — MAGNESIUM OXIDE 400 MG PO TABS: 400.0000 mg | ORAL_TABLET | Freq: Two times a day (BID) | ORAL | 0 refills | Status: DC

## 2021-08-28 ENCOUNTER — Other Ambulatory Visit (HOSPITAL_COMMUNITY): Payer: Self-pay

## 2021-09-19 ENCOUNTER — Other Ambulatory Visit (HOSPITAL_COMMUNITY): Payer: Self-pay

## 2021-09-19 DIAGNOSIS — C569 Malignant neoplasm of unspecified ovary: Secondary | ICD-10-CM

## 2021-09-20 ENCOUNTER — Inpatient Hospital Stay (HOSPITAL_COMMUNITY): Payer: Medicare HMO | Attending: Hematology

## 2021-09-20 ENCOUNTER — Other Ambulatory Visit (HOSPITAL_COMMUNITY): Payer: Self-pay

## 2021-09-20 ENCOUNTER — Other Ambulatory Visit: Payer: Self-pay

## 2021-09-20 DIAGNOSIS — G629 Polyneuropathy, unspecified: Secondary | ICD-10-CM | POA: Diagnosis not present

## 2021-09-20 DIAGNOSIS — J449 Chronic obstructive pulmonary disease, unspecified: Secondary | ICD-10-CM | POA: Diagnosis not present

## 2021-09-20 DIAGNOSIS — D539 Nutritional anemia, unspecified: Secondary | ICD-10-CM | POA: Diagnosis not present

## 2021-09-20 DIAGNOSIS — C569 Malignant neoplasm of unspecified ovary: Secondary | ICD-10-CM | POA: Insufficient documentation

## 2021-09-20 DIAGNOSIS — C786 Secondary malignant neoplasm of retroperitoneum and peritoneum: Secondary | ICD-10-CM | POA: Diagnosis not present

## 2021-09-20 LAB — COMPREHENSIVE METABOLIC PANEL
ALT: 20 U/L (ref 0–44)
AST: 29 U/L (ref 15–41)
Albumin: 3.8 g/dL (ref 3.5–5.0)
Alkaline Phosphatase: 79 U/L (ref 38–126)
Anion gap: 9 (ref 5–15)
BUN: 13 mg/dL (ref 8–23)
CO2: 34 mmol/L — ABNORMAL HIGH (ref 22–32)
Calcium: 9 mg/dL (ref 8.9–10.3)
Chloride: 94 mmol/L — ABNORMAL LOW (ref 98–111)
Creatinine, Ser: 0.75 mg/dL (ref 0.44–1.00)
GFR, Estimated: >60 mL/min (ref >60)
Glucose, Bld: 115 mg/dL — ABNORMAL HIGH (ref 70–99)
Potassium: 4.3 mmol/L (ref 3.5–5.1)
Sodium: 137 mmol/L (ref 135–145)
Total Bilirubin: 0.4 mg/dL (ref 0.3–1.2)
Total Protein: 6.8 g/dL (ref 6.5–8.1)

## 2021-09-20 LAB — CBC WITH DIFFERENTIAL/PLATELET
Abs Immature Granulocytes: 0.02 10*3/uL (ref 0.00–0.07)
Basophils Absolute: 0.1 10*3/uL (ref 0.0–0.1)
Basophils Relative: 1 %
Eosinophils Absolute: 0.1 10*3/uL (ref 0.0–0.5)
Eosinophils Relative: 1 %
HCT: 28.1 % — ABNORMAL LOW (ref 36.0–46.0)
Hemoglobin: 8.9 g/dL — ABNORMAL LOW (ref 12.0–15.0)
Immature Granulocytes: 0 %
Lymphocytes Relative: 14 %
Lymphs Abs: 0.9 10*3/uL (ref 0.7–4.0)
MCH: 34.5 pg — ABNORMAL HIGH (ref 26.0–34.0)
MCHC: 31.7 g/dL (ref 30.0–36.0)
MCV: 108.9 fL — ABNORMAL HIGH (ref 80.0–100.0)
Monocytes Absolute: 0.7 10*3/uL (ref 0.1–1.0)
Monocytes Relative: 11 %
Neutro Abs: 4.7 10*3/uL (ref 1.7–7.7)
Neutrophils Relative %: 73 %
Platelets: 97 10*3/uL — ABNORMAL LOW (ref 150–400)
RBC: 2.58 MIL/uL — ABNORMAL LOW (ref 3.87–5.11)
RDW: 15.9 % — ABNORMAL HIGH (ref 11.5–15.5)
WBC: 6.5 10*3/uL (ref 4.0–10.5)
nRBC: 0 % (ref 0.0–0.2)

## 2021-09-21 LAB — CA 125: Cancer Antigen (CA) 125: 31 U/mL (ref 0.0–38.1)

## 2021-09-25 ENCOUNTER — Other Ambulatory Visit (HOSPITAL_COMMUNITY): Payer: Self-pay

## 2021-09-26 NOTE — Progress Notes (Signed)
[name], [place] [id number]   CLINIC:  Medical Oncology/Hematology  PCP:  [name], [place] / [place] [address] [phone]   REASON FOR VISIT:  Follow-up for high-grade serous ovarian carcinoma  PRIOR THERAPY: none  NGS Results: BRCA 1/2 negative  CURRENT THERAPY: Carboplatin, paclitaxel & Aloxi every 3 weeks  BRIEF ONCOLOGIC HISTORY:  Oncology History  Peritoneal carcinomatosis ([place])  06/06/2020 Initial Diagnosis   Peritoneal carcinomatosis ([place])   07/05/2020 -  Chemotherapy    Patient is on Treatment Plan: OVARIAN CARBOPLATIN (AUC 6) / PACLITAXEL (175) Q21D X 6 CYCLES       07/22/2020 Genetic Testing   Negative genetic testing: no pathogenic variants detected in [facility] Expanded CancerNext Panel + RNAInsight. The CancerNext-Expanded gene panel offered by [facility] and includes sequencing and rearrangement analysis for the following 77 genes: AIP, ALK, APC*, ATM*, AXIN2, BAP1, BARD1, BLM, BMPR1A, BRCA1*, BRCA2*, BRIP1*, CDC73, CDH1*, CDK4, CDKN1B, CDKN2A, CHEK2*, CTNNA1, DICER1, FANCC, FH, FLCN, GALNT12, KIF1B, LZTR1, MAX, MEN1, MET, MLH1*, MSH2*, MSH3, MSH6*, MUTYH*, NBN, NF1*, NF2, NTHL1, PALB2*, PHOX2B, PMS2*, POT1, PRKAR1A, PTCH1, PTEN*, RAD51C*, RAD51D*, RB1, RECQL, RET, SDHA, SDHAF2, SDHB, SDHC, SDHD, SMAD4, SMARCA4, SMARCB1, SMARCE1, STK11, SUFU, TMEM127, TP53*, TSC1, TSC2, VHL and XRCC2 (sequencing and deletion/duplication); EGFR, EGLN1, HOXB13, KIT, MITF, PDGFRA, POLD1, and POLE (sequencing only); EPCAM and GREM1 (deletion/duplication only). DNA and RNA analyses performed for * genes. The report date is July 22, 2020.    Ovarian cancer ([place])  07/07/2020 Initial Diagnosis   Ovarian cancer ([place])   05/04/2021 Imaging   1. No findings today to suggest new or progressive disease related to ovarian cancer. 2. Interval near complete resolution of the irregular and nodular areas of consolidative left upper  lobe opacity seen on the previous study. Residual architectural distortion in this region is compatible with scarring. Tiny nodules persist in the left upper lobe and attention on follow-up recommended. 3. Interval improvement in tree-in-bud nodularity anteromedial left upper lobe. Changes of bronchial wall thickening and mild cylindrical bronchiectasis in the lower lungs are similar to prior with persistent tree-in-bud nodularity posterior left lower lobe. Imaging features suggest sequelae of atypical infection. 4. Similar appearance of the omental soft tissue stranding with less prominent nodularity on today's study. 5. No ascites. 6. Mild circumferential wall thickening distal esophagus. Esophagitis could have this appearance. 7. Left colonic diverticulosis without diverticulitis. 8. Aortic Atherosclerosis (ICD10-I70.0) and Emphysema (ICD10-J43.9).   05/30/2021 -  Chemotherapy   She takes niraparib         CANCER STAGING: Cancer Staging No matching staging information was found for the patient.  INTERVAL HISTORY:  [name], a 65 y.o. female, returns for routine follow-up of her high-grade serous ovarian carcinoma. Margy was last seen on 05/02/2021.   Today she reports feeling well. She denies current change in taste, fatigue, n/v/d, rash, bleeding and black stools. She reports SOB and easy bruising. She is taking prednisone 10 mg once daily. Her appetite and energy levels are good.  REVIEW OF SYSTEMS:  Review of Systems  Constitutional:  Negative for appetite change and fatigue (25%).  HENT:   Negative for nosebleeds.   Respiratory:  Positive for shortness of breath. Negative for hemoptysis.   Gastrointestinal:  Negative for blood in stool, diarrhea, nausea and vomiting.  Genitourinary:  Negative for hematuria and vaginal bleeding.   Skin:  Negative for rash.  Hematological:  Bruises/bleeds easily.  All other systems  reviewed and are negative.  PAST MEDICAL/SURGICAL  HISTORY:  Past Medical History:  Diagnosis Date   Anemia    Aortic atherosclerosis (HCC)    Arthritis    Asthma    Bilateral carotid artery stenosis    Cancer (HCC)    Gastric Cancer   COPD (chronic obstructive pulmonary disease) (HCC)    High cholesterol    Hyperlipidemia    Hypertension    Neuropathy    Osteoarthritis    Ovarian cancer ([place])    Oxygen dependent    Peripheral vascular disease ([place])    Peritoneal carcinoma ([place])    Pneumonia 2019   Port-A-Cath in place 07/03/2020   Pulmonary emphysema ([place])    Past Surgical History:  Procedure Laterality Date   ESOPHAGOGASTRODUODENOSCOPY (EGD) WITH PROPOFOL N/A 02/17/2021   Procedure: ESOPHAGOGASTRODUODENOSCOPY (EGD) WITH PROPOFOL;  Surgeon: Harvel Quale, MD;  [facility];  Service: Gastroenterology;  Laterality: N/A;   HALLUX VALGUS BASE WEDGE Right 06/09/2015   Procedure: Base wedge osteotomy with modified McBride right foot ;  Surgeon: Sharlotte Alamo, MD;  Location: [facility];  Service: Podiatry;  Laterality: Right;   PORTACATH PLACEMENT Left 06/28/2020   Procedure: PORT-A-CATHETER PLACEMENT LEFT CHEST (attached catheter in left subclavian);  Surgeon: Virl Cagey, MD;  Location: [facility];  Service: General;  Laterality: Left;   TUBAL LIGATION     VIDEO BRONCHOSCOPY WITH ENDOBRONCHIAL NAVIGATION N/A 03/09/2021   Procedure: VIDEO BRONCHOSCOPY WITH ENDOBRONCHIAL NAVIGATION;  Surgeon: Ottie Glazier, MD;  Location: [facility];  Service: Thoracic;  Laterality: N/A;   VIDEO BRONCHOSCOPY WITH ENDOBRONCHIAL ULTRASOUND N/A 03/09/2021   Procedure: VIDEO BRONCHOSCOPY WITH ENDOBRONCHIAL ULTRASOUND;  Surgeon: Ottie Glazier, MD;  Location: [facility];  Service: Thoracic;  Laterality: N/A;    SOCIAL HISTORY:  Social History   Socioeconomic History   Marital status: Divorced    Spouse name: Not on file   Number of children: 3   Years of education: Not on file   Highest education level: Not on file  Occupational  History   Occupation: DISABLED  Tobacco Use   Smoking status: Former    Packs/day: 2.00    Years: 30.00    Pack years: 60.00    Types: Cigarettes    Quit date: 11/04/2013    Years since quitting: 7.9   Smokeless tobacco: Never  Vaping Use   Vaping Use: Never used  Substance and Sexual Activity   Alcohol use: No   Drug use: No   Sexual activity: Not Currently  Other Topics Concern   Not on file  Social History Narrative   Not on file   Social Determinants of Health   Financial Resource Strain: Low Risk    Difficulty of Paying Living Expenses: Not hard at all  Food Insecurity: No Food Insecurity   Worried About Charity fundraiser in the Last Year: Never true   [place] in the Last Year: Never true  Transportation Needs: No Transportation Needs   Lack of Transportation (Medical): No   Lack of Transportation (Non-Medical): No  Physical Activity: Inactive   Days of Exercise per Week: 0 days   Minutes of Exercise per Session: 0 min  Stress: No Stress Concern Present   Feeling of Stress : Not at all  Social Connections: Socially Isolated   Frequency of Communication with Friends and Family: More than three times a week   Frequency of Social Gatherings with Friends and Family: Never   Attends Religious Services:  Never   Active Member of Clubs or Organizations: No   Attends Archivist Meetings: Never   Marital Status: Divorced  Human resources officer Violence: Not At Risk   Fear of Current or Ex-Partner: No   Emotionally Abused: No   Physically Abused: No   Sexually Abused: No    FAMILY HISTORY:  Family History  Problem Relation Age of Onset   Alzheimer's disease Mother    COPD Father    Emphysema Father    Hypertension Father    Healthy Sister    Healthy Brother    Alzheimer's disease Maternal Grandmother    Healthy Sister    Healthy Sister    Healthy Sister    Prostate cancer Other        paternal grandmother's brother; dx in early 19s   Breast  cancer Neg Hx     CURRENT MEDICATIONS:  Current Outpatient Medications  Medication Sig Dispense Refill   amLODipine (NORVASC) 5 MG tablet Take 5 mg by mouth at bedtime.      aspirin EC 81 MG tablet Take 81 mg by mouth daily.     CARBOPLATIN IV Inject into the vein every 21 ( twenty-one) days.     ferrous sulfate 325 (65 FE) MG tablet Take 325 mg by mouth every other day.     Fluticasone-Umeclidin-Vilant (TRELEGY ELLIPTA) 100-62.5-25 MCG/ACT AEPB Inhale into the lungs.     gabapentin (NEURONTIN) 600 MG tablet Take 600 mg by mouth 2 (two) times daily.     ipratropium-albuterol (DUONEB) 0.5-2.5 (3) MG/3ML SOLN Take 0.5 mg by nebulization every 6 (six) hours as needed (COPD).     magnesium oxide (MAG-OX) 400 (241.3 Mg) MG tablet Take 1 tablet (400 mg total) by mouth 2 (two) times daily. 60 tablet 3   magnesium oxide (MAG-OX) 400 MG tablet Take 1 tablet (400 mg total) by mouth 2 (two) times daily. 120 tablet 0   niraparib tosylate (ZEJULA) 100 MG capsule Take 2 capsules (200 mg total) by mouth daily. May take at bedtime to reduce nausea and vomiting. 60 capsule 11   nortriptyline (PAMELOR) 25 MG capsule Take 25 mg by mouth at bedtime.     ondansetron (ZOFRAN ODT) 4 MG disintegrating tablet 49m ODT q4 hours prn nausea/vomit 12 tablet 0   OXYGEN Inhale 3 L into the lungs continuous.      PACLITAXEL IV Inject into the vein every 21 ( twenty-one) days.     pantoprazole (PROTONIX) 40 MG tablet Take 1 tablet (40 mg total) by mouth daily. 30 tablet 2   predniSONE (DELTASONE) 10 MG tablet Take 1 tablet by mouth daily.     simvastatin (ZOCOR) 40 MG tablet Take 40 mg by mouth at bedtime.     albuterol (PROVENTIL HFA;VENTOLIN HFA) 108 (90 BASE) MCG/ACT inhaler Inhale 4-6 puffs into the lungs every 6 (six) hours as needed for wheezing or shortness of breath. (Patient not taking: Reported on 09/27/2021)     ipratropium (ATROVENT HFA) 17 MCG/ACT inhaler Inhale 2 puffs into the lungs every 6 (six) hours as  needed for wheezing. (Patient not taking: Reported on 09/27/2021)     ondansetron (ZOFRAN) 8 MG tablet Take 1 tablet (8 mg total) by mouth every 8 (eight) hours as needed for nausea. (Patient not taking: Reported on 09/27/2021) 30 tablet 3   No current facility-administered medications for this visit.    ALLERGIES:  No Known Allergies  PHYSICAL EXAM:  Performance status (ECOG): 1 - Symptomatic but completely ambulatory  Vitals:   09/27/21 1507  BP: (!) 143/71  Pulse: (!) 110  Resp: 19  Temp: 98.7 F (37.1 C)  SpO2: 96%   Wt Readings from Last 3 Encounters:  09/27/21 118 lb 3.2 oz (53.6 kg)  06/20/21 117 lb 11.6 oz (53.4 kg)  05/23/21 117 lb 12.8 oz (53.4 kg)   Physical Exam Vitals reviewed.  Constitutional:      Appearance: Normal appearance.     Interventions: Nasal cannula in place.     Comments: In wheelchair  Cardiovascular:     Rate and Rhythm: Normal rate and regular rhythm.     Pulses: Normal pulses.     Heart sounds: Normal heart sounds.  Pulmonary:     Effort: Pulmonary effort is normal.     Breath sounds: Normal breath sounds.  Musculoskeletal:     Right lower leg: No edema.     Left lower leg: No edema.  Neurological:     General: No focal deficit present.     Mental Status: She is alert and oriented to person, place, and time.  Psychiatric:        Mood and Affect: Mood normal.        Behavior: Behavior normal.     LABORATORY DATA:  I have reviewed the labs as listed.  CBC Latest Ref Rng & Units 09/20/2021 06/20/2021 06/13/2021  WBC 4.0 - 10.5 K/uL 6.5 4.2 6.6  Hemoglobin 12.0 - 15.0 g/dL 8.9(L) 10.1(L) 10.3(L)  Hematocrit 36.0 - 46.0 % 28.1(L) 30.6(L) 32.0(L)  Platelets 150 - 400 K/uL 97(L) 54(L) 128(L)   CMP Latest Ref Rng & Units 09/20/2021 06/20/2021 06/13/2021  Glucose 70 - 99 mg/dL 115(H) 103(H) 104(H)  BUN 8 - 23 mg/dL '13 9 12  ' Creatinine 0.44 - 1.00 mg/dL 0.75 0.64 0.69  Sodium 135 - 145 mmol/L 137 138 133(L)  Potassium 3.5 - 5.1 mmol/L 4.3  3.7 3.4(L)  Chloride 98 - 111 mmol/L 94(L) 95(L) 94(L)  CO2 22 - 32 mmol/L 34(H) 32 31  Calcium 8.9 - 10.3 mg/dL 9.0 9.0 8.5(L)  Total Protein 6.5 - 8.1 g/dL 6.8 6.9 6.8  Total Bilirubin 0.3 - 1.2 mg/dL 0.4 0.5 0.5  Alkaline Phos 38 - 126 U/L 79 69 70  AST 15 - 41 U/L 29 37 42(H)  ALT 0 - 44 U/L 20 32 28    DIAGNOSTIC IMAGING:  I have independently reviewed the scans and discussed with the patient. No results found.   ASSESSMENT:  1.  Peritoneal carcinomatosis: -Presentation to the ER with right lower quadrant abdominal pain on and off for 1 month. -CTAP on 05/29/2020 showed extensive nodularity throughout the omentum and upper peritoneal cavity.  Both ovaries are prominent although no well-defined mass is noted.  Small volume ascites. -CA-125 is elevated at 2407.  CEA was 2.4. -CT of the chest shows 11 mm right paratracheal lymph node and 11 mm short axis precarinal lymph node.  Subcarinal lymphadenopathy measuring 14 mm short axis.  This is suspicious for metastatic disease. 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 -Cycle 1 of carboplatin and paclitaxel on 07/05/2020. -Evaluated by Dr. Denman George on 07/07/2020.  Reevaluation after 3 cycles. -CT CAP from 09/01/2020 showed mild improvement with reduction in the omental caking.  Stable to minimally reduced adenopathy in the chest and abdomen.  New 4 mm right upper lobe lung nodule could be inflammatory. -CT CAP on 11/07/2019 and  after 6 cycles showed improved peritoneal disease/omental caking.  9 mm short axis portacaval lymph node and adjacent upper abdominal lymph nodes measuring 12 mm in short axis.  Small retroperitoneal lymph nodes up to 5 mm improved.  Peritoneal disease/omental caking now measures 2.4 cm in thickness. -CA-125 on 10/18/2020 improved 52. -Plan for surgical resection after 6  cycles.  Unfortunately she was not cleared for surgery by anesthesia/pulmonary. - CT CAP on 02/12/2021 showed peritoneal disease and abdominal lymph nodes have improved.  However new masslike areas in the left upper lobe, new since January 31 CT scan along with borderline enlargement of the left supraclavicular lymph node. - She underwent bronchoscopy and biopsy on 03/09/2021 which was benign. - Niraparib 200 mg daily started around 05/25/2021.   2.  COPD: -Quit smoking 6 years ago.  Smoked 2 packs/day for 25-30 years. -Has been on 3 L/min oxygen via nasal cannula for the last 5 years.   3.  Family history: -Paternal grandmother with colon cancer.   PLAN:  1.  High-grade serous ovarian carcinoma: - She was started on niraparib 200 mg daily in July 2022. - She reports that she is tolerating it reasonably well.  Denies any GI side effects. - She reports that she has shortness of breath on exertion for the last 2 weeks and was evaluated by pulmonologist and prednisone was started.  She is taking prednisone 10 mg daily. - Reviewed labs from 09/20/2021 which showed hemoglobin 8.9 with MCV 108.  Platelet count is low at 97.  LFTs were normal.  Creatinine was 0.75.  CA125 is 31. - Macrocytic anemia, likely from neuropathy. - However will check other nutritional deficiencies including O77, folic acid, copper, methylmalonic acid and TSH levels.  We will also check ferritin and iron panel. - Recommend CT CAP for surveillance in 4 weeks.   2.  Family history: - Germline mutation testing was negative.   3.  Peripheral neuropathy: - Continue gabapentin 300 mg 3 times daily.   4.  Severe COPD: - Continue prednisone, Trelegy and Xopenex.   5.  Hypomagnesemia: - Continue magnesium twice daily.   Orders placed this encounter:  No orders of the defined types were placed in this encounter.    Derek Jack, MD [place] [phone]   I, Thana Ates, am acting as a scribe  for Dr. Derek Jack.  I, Derek Jack MD, have reviewed the above documentation for accuracy and completeness, and I agree with the above.

## 2021-09-27 ENCOUNTER — Other Ambulatory Visit: Payer: Self-pay

## 2021-09-27 ENCOUNTER — Inpatient Hospital Stay (HOSPITAL_BASED_OUTPATIENT_CLINIC_OR_DEPARTMENT_OTHER): Payer: Medicare HMO | Admitting: Hematology

## 2021-09-27 VITALS — BP 143/71 | HR 110 | Temp 98.7°F | Resp 19 | Wt 118.2 lb

## 2021-09-27 DIAGNOSIS — C569 Malignant neoplasm of unspecified ovary: Secondary | ICD-10-CM | POA: Diagnosis not present

## 2021-09-27 DIAGNOSIS — C786 Secondary malignant neoplasm of retroperitoneum and peritoneum: Secondary | ICD-10-CM

## 2021-09-27 DIAGNOSIS — D539 Nutritional anemia, unspecified: Secondary | ICD-10-CM | POA: Diagnosis not present

## 2021-09-27 DIAGNOSIS — J9611 Chronic respiratory failure with hypoxia: Secondary | ICD-10-CM | POA: Diagnosis not present

## 2021-09-27 NOTE — Progress Notes (Signed)
CT CAP and labs ordered per Dr. Tomie China order.

## 2021-09-27 NOTE — Patient Instructions (Signed)
[name] at [facility] Discharge Instructions  You were seen and examined today by [name]. He reviewed your most recent labs and your hemoglobin was low. This can be caused by your cancer pill. We will repeat scan and labs before next appointment. This can cause you to feel short winded. Please keep follow up as scheduled.    Thank you for choosing [facility] to provide your oncology and hematology care.  To afford each patient quality time with our provider, please arrive at least 15 minutes before your scheduled appointment time.   If you have a lab appointment with the [facility] please come in thru the Main Entrance and check in at the main information desk.  You need to re-schedule your appointment should you arrive 10 or more minutes late.  We strive to give you quality time with our providers, and arriving late affects you and other patients whose appointments are after yours.  Also, if you no show three or more times for appointments you may be dismissed from the clinic at the providers discretion.     Again, thank you for choosing [facility].  Our hope is that these requests will decrease the amount of time that you wait before being seen by our physicians.       _____________________________________________________________  Should you have questions after your visit to [facility], please contact our office at [phone] and follow the prompts.  Our office hours are 8:00 a.m. and 4:30 p.m. Monday - Friday.  Please note that voicemails left after 4:00 p.m. may not be returned until the following business day.  We are closed weekends and major holidays.  You do have access to a nurse 24-7, just call the main number to the clinic [phone] and do not press any options, hold on the line and a nurse will answer the phone.    For prescription refill requests, have your pharmacy contact our  office and allow 72 hours.    Due to Covid, you will need to wear a mask upon entering the hospital. If you do not have a mask, a mask will be given to you at the Main Entrance upon arrival. For doctor visits, patients may have 1 support person age 5 or older with them. For treatment visits, patients can not have anyone with them due to social distancing guidelines and our immunocompromised population.

## 2021-10-16 ENCOUNTER — Other Ambulatory Visit (HOSPITAL_COMMUNITY): Payer: Self-pay

## 2021-10-23 ENCOUNTER — Other Ambulatory Visit (HOSPITAL_COMMUNITY): Payer: Self-pay

## 2021-10-26 ENCOUNTER — Other Ambulatory Visit (HOSPITAL_COMMUNITY): Payer: Self-pay | Admitting: Hematology

## 2021-10-26 DIAGNOSIS — C569 Malignant neoplasm of unspecified ovary: Secondary | ICD-10-CM

## 2021-10-30 ENCOUNTER — Ambulatory Visit (HOSPITAL_COMMUNITY)
Admission: RE | Admit: 2021-10-30 | Discharge: 2021-10-30 | Disposition: A | Discharge status: Home / Self Care | Payer: Medicare HMO | Source: Ambulatory Visit | Attending: Hematology | Admitting: Hematology

## 2021-10-30 ENCOUNTER — Inpatient Hospital Stay (HOSPITAL_COMMUNITY): Payer: Medicare HMO | Attending: Hematology

## 2021-10-30 ENCOUNTER — Encounter (HOSPITAL_COMMUNITY): Payer: Self-pay | Admitting: Hematology

## 2021-10-30 ENCOUNTER — Other Ambulatory Visit: Payer: Self-pay

## 2021-10-30 DIAGNOSIS — D649 Anemia, unspecified: Secondary | ICD-10-CM | POA: Insufficient documentation

## 2021-10-30 DIAGNOSIS — D539 Nutritional anemia, unspecified: Secondary | ICD-10-CM

## 2021-10-30 DIAGNOSIS — C786 Secondary malignant neoplasm of retroperitoneum and peritoneum: Secondary | ICD-10-CM | POA: Insufficient documentation

## 2021-10-30 DIAGNOSIS — C569 Malignant neoplasm of unspecified ovary: Secondary | ICD-10-CM | POA: Insufficient documentation

## 2021-10-30 DIAGNOSIS — J449 Chronic obstructive pulmonary disease, unspecified: Secondary | ICD-10-CM | POA: Insufficient documentation

## 2021-10-30 DIAGNOSIS — J9611 Chronic respiratory failure with hypoxia: Secondary | ICD-10-CM

## 2021-10-30 DIAGNOSIS — Z87891 Personal history of nicotine dependence: Secondary | ICD-10-CM | POA: Insufficient documentation

## 2021-10-30 DIAGNOSIS — G629 Polyneuropathy, unspecified: Secondary | ICD-10-CM | POA: Insufficient documentation

## 2021-10-30 DIAGNOSIS — I10 Essential (primary) hypertension: Secondary | ICD-10-CM | POA: Insufficient documentation

## 2021-10-30 LAB — CBC WITH DIFFERENTIAL/PLATELET
Abs Immature Granulocytes: 0.04 10*3/uL (ref 0.00–0.07)
Basophils Absolute: 0 10*3/uL (ref 0.0–0.1)
Basophils Relative: 1 %
Eosinophils Absolute: 0 10*3/uL (ref 0.0–0.5)
Eosinophils Relative: 1 %
HCT: 29.7 % — ABNORMAL LOW (ref 36.0–46.0)
Hemoglobin: 9.6 g/dL — ABNORMAL LOW (ref 12.0–15.0)
Immature Granulocytes: 1 %
Lymphocytes Relative: 11 %
Lymphs Abs: 0.7 10*3/uL (ref 0.7–4.0)
MCH: 34 pg (ref 26.0–34.0)
MCHC: 32.3 g/dL (ref 30.0–36.0)
MCV: 105.3 fL — ABNORMAL HIGH (ref 80.0–100.0)
Monocytes Absolute: 0.5 10*3/uL (ref 0.1–1.0)
Monocytes Relative: 8 %
Neutro Abs: 5.2 10*3/uL (ref 1.7–7.7)
Neutrophils Relative %: 78 %
Platelets: 116 10*3/uL — ABNORMAL LOW (ref 150–400)
RBC: 2.82 MIL/uL — ABNORMAL LOW (ref 3.87–5.11)
RDW: 14.3 % (ref 11.5–15.5)
WBC: 6.4 10*3/uL (ref 4.0–10.5)
nRBC: 0 % (ref 0.0–0.2)

## 2021-10-30 LAB — VITAMIN B12: Vitamin B-12: 261 pg/mL (ref 180–914)

## 2021-10-30 LAB — FERRITIN: Ferritin: 824 ng/mL — ABNORMAL HIGH (ref 11–307)

## 2021-10-30 LAB — COMPREHENSIVE METABOLIC PANEL
ALT: 20 U/L (ref 0–44)
AST: 25 U/L (ref 15–41)
Albumin: 3.5 g/dL (ref 3.5–5.0)
Alkaline Phosphatase: 83 U/L (ref 38–126)
Anion gap: 12 (ref 5–15)
BUN: 16 mg/dL (ref 8–23)
CO2: 30 mmol/L (ref 22–32)
Calcium: 8.8 mg/dL — ABNORMAL LOW (ref 8.9–10.3)
Chloride: 92 mmol/L — ABNORMAL LOW (ref 98–111)
Creatinine, Ser: 0.6 mg/dL (ref 0.44–1.00)
GFR, Estimated: >60 mL/min (ref >60)
Glucose, Bld: 110 mg/dL — ABNORMAL HIGH (ref 70–99)
Potassium: 3.5 mmol/L (ref 3.5–5.1)
Sodium: 134 mmol/L — ABNORMAL LOW (ref 135–145)
Total Bilirubin: 0.3 mg/dL (ref 0.3–1.2)
Total Protein: 7.3 g/dL (ref 6.5–8.1)

## 2021-10-30 LAB — IRON AND TIBC
Iron: 57 ug/dL (ref 28–170)
Saturation Ratios: 18 % (ref 10.4–31.8)
TIBC: 312 ug/dL (ref 250–450)
UIBC: 255 ug/dL

## 2021-10-30 LAB — TSH: TSH: 2.984 u[IU]/mL (ref 0.350–4.500)

## 2021-10-30 LAB — FOLATE: Folate: 8.8 ng/mL (ref >5.9)

## 2021-11-01 LAB — COPPER, SERUM: Copper: 156 ug/dL (ref 80–158)

## 2021-11-02 ENCOUNTER — Other Ambulatory Visit (HOSPITAL_COMMUNITY): Payer: Self-pay | Admitting: Family Medicine

## 2021-11-02 ENCOUNTER — Other Ambulatory Visit: Payer: Self-pay

## 2021-11-02 ENCOUNTER — Ambulatory Visit (HOSPITAL_COMMUNITY)
Admission: RE | Admit: 2021-11-02 | Discharge: 2021-11-02 | Disposition: A | Discharge status: Home / Self Care | Payer: Medicare HMO | Source: Ambulatory Visit | Attending: Family Medicine | Admitting: Family Medicine

## 2021-11-02 DIAGNOSIS — J441 Chronic obstructive pulmonary disease with (acute) exacerbation: Secondary | ICD-10-CM | POA: Diagnosis present

## 2021-11-04 LAB — METHYLMALONIC ACID, SERUM: Methylmalonic Acid, Quantitative: 183 nmol/L (ref 0–378)

## 2021-11-04 NOTE — Progress Notes (Signed)
° °[facility] °[address] °[place], [place] [address] ° ° °CLINIC:  °Medical Oncology/Hematology ° °PCP:  °Adamo, Elena M, MD °[address] / [place] [address] °[phone] ° ° °REASON FOR VISIT:  °Follow-up for high-grade serous ovarian carcinoma ° °PRIOR THERAPY: none ° °NGS Results: BRCA 1/2 negative ° °CURRENT THERAPY: Carboplatin, paclitaxel & Aloxi every 3 weeks ° °BRIEF ONCOLOGIC HISTORY:  °Oncology History  °Peritoneal carcinomatosis (HCC)  °06/06/2020 Initial Diagnosis  ° Peritoneal carcinomatosis (HCC) °  °07/05/2020 -  Chemotherapy  °  Patient is on Treatment Plan: OVARIAN CARBOPLATIN (AUC 6) / PACLITAXEL (175) Q21D X 6 CYCLES ° °  ° °  °07/22/2020 Genetic Testing  ° Negative genetic testing: no pathogenic variants detected in [facility] Expanded CancerNext Panel + [employer]ight. The CancerNext-Expanded gene panel offered by [facility] [employer] and includes sequencing and rearrangement analysis for the following 77 genes: AIP, ALK, APC*, ATM*, AXIN2, BAP1, BARD1, BLM, BMPR1A, BRCA1*, BRCA2*, BRIP1*, CDC73, CDH1*, CDK4, CDKN1B, CDKN2A, CHEK2*, CTNNA1, DICER1, FANCC, FH, FLCN, GALNT12, KIF1B, LZTR1, MAX, MEN1, MET, MLH1*, MSH2*, MSH3, MSH6*, MUTYH*, NBN, NF1*, NF2, NTHL1, PALB2*, PHOX2B, PMS2*, POT1, PRKAR1A, PTCH1, PTEN*, RAD51C*, RAD51D*, RB1, RECQL, RET, SDHA, SDHAF2, SDHB, SDHC, SDHD, SMAD4, SMARCA4, SMARCB1, SMARCE1, STK11, SUFU, TMEM127, TP53*, TSC1, TSC2, VHL and XRCC2 (sequencing and deletion/duplication); EGFR, EGLN1, HOXB13, KIT, MITF, PDGFRA, POLD1, and POLE (sequencing only); EPCAM and GREM1 (deletion/duplication only). DNA and RNA analyses performed for * genes. The report date is July 22, 2020.  °  °Ovarian cancer (HCC)  °07/07/2020 Initial Diagnosis  ° Ovarian cancer (HCC) °  °05/04/2021 Imaging  ° 1. No findings today to suggest new or progressive disease related to ovarian cancer. °2. Interval near complete resolution of the irregular and nodular areas of consolidative left upper  lobe opacity seen on the previous study. Residual architectural distortion in this region is compatible with scarring. Tiny nodules persist in the left upper lobe and attention on follow-up recommended. °3. Interval improvement in tree-in-bud nodularity anteromedial left upper lobe. Changes of bronchial wall thickening and mild cylindrical bronchiectasis in the lower lungs are similar to prior with persistent tree-in-bud nodularity posterior left lower lobe. Imaging features suggest sequelae of atypical infection. °4. Similar appearance of the omental soft tissue stranding with less prominent nodularity on today's study. °5. No ascites. °6. Mild circumferential wall thickening distal esophagus. °Esophagitis could have this appearance. °7. Left colonic diverticulosis without diverticulitis. °8. Aortic Atherosclerosis (ICD10-I70.0) and Emphysema (ICD10-J43.9). °  °05/30/2021 -  Chemotherapy  ° She takes niraparib ° °  ° °  ° ° °CANCER STAGING: ° Cancer Staging  °No matching staging information was found for the patient. ° °INTERVAL HISTORY:  °[name], a 65 y.o. female, returns for routine follow-up of her high-grade serous ovarian carcinoma. Deshea was last seen on 09/27/2021.  ° °Today she reports feeling well.  ° °REVIEW OF SYSTEMS:  °Review of Systems  °Constitutional:  Positive for fatigue (40%). Negative for appetite change.  °Respiratory:  Positive for shortness of breath.   °Neurological:  Positive for headaches.  °All other systems reviewed and are negative. ° °PAST MEDICAL/SURGICAL HISTORY:  °Past Medical History:  °Diagnosis Date  ° Anemia   ° Aortic atherosclerosis (HCC)   ° Arthritis   ° Asthma   ° Bilateral carotid artery stenosis   ° Cancer (HCC)   ° Gastric Cancer  ° COPD (chronic obstructive pulmonary disease) (HCC)   ° High cholesterol   ° Hyperlipidemia   °   Hypertension   ° Neuropathy   ° Osteoarthritis   ° Ovarian cancer (HCC)   ° Oxygen dependent   ° Peripheral vascular disease (HCC)   °  Peritoneal carcinoma (HCC)   ° Pneumonia 2019  ° Port-A-Cath in place 07/03/2020  ° Pulmonary emphysema (HCC)   ° °Past Surgical History:  °Procedure Laterality Date  ° ESOPHAGOGASTRODUODENOSCOPY (EGD) WITH PROPOFOL N/A 02/17/2021  ° Procedure: ESOPHAGOGASTRODUODENOSCOPY (EGD) WITH PROPOFOL;  Surgeon: Castaneda Mayorga, Daniel, MD;  [facility];  Service: Gastroenterology;  Laterality: N/A;  ° HALLUX VALGUS BASE WEDGE Right 06/09/2015  ° Procedure: Base wedge osteotomy with modified McBride right foot ;  Surgeon: Todd Cline, MD;  Location: [facility];  Service: Podiatry;  Laterality: Right;  ° PORTACATH PLACEMENT Left 06/28/2020  ° Procedure: PORT-A-CATHETER PLACEMENT LEFT CHEST (attached catheter in left subclavian);  Surgeon: Bridges, Lindsay C, MD;  Location: [facility];  Service: General;  Laterality: Left;  ° TUBAL LIGATION    ° VIDEO BRONCHOSCOPY WITH ENDOBRONCHIAL NAVIGATION N/A 03/09/2021  ° Procedure: VIDEO BRONCHOSCOPY WITH ENDOBRONCHIAL NAVIGATION;  Surgeon: Aleskerov, Fuad, MD;  Location: [facility];  Service: Thoracic;  Laterality: N/A;  ° VIDEO BRONCHOSCOPY WITH ENDOBRONCHIAL ULTRASOUND N/A 03/09/2021  ° Procedure: VIDEO BRONCHOSCOPY WITH ENDOBRONCHIAL ULTRASOUND;  Surgeon: Aleskerov, Fuad, MD;  Location: [facility];  Service: Thoracic;  Laterality: N/A;  ° ° °SOCIAL HISTORY:  °Social History  ° °Socioeconomic History  ° Marital status: Divorced  °  Spouse name: Not on file  ° Number of children: 3  ° Years of education: Not on file  ° Highest education level: Not on file  °Occupational History  ° Occupation: DISABLED  °Tobacco Use  ° Smoking status: Former  °  Packs/day: 2.00  °  Years: 30.00  °  Pack years: 60.00  °  Types: Cigarettes  °  Quit date: 11/04/2013  °  Years since quitting: 8.0  ° Smokeless tobacco: Never  °Vaping Use  ° Vaping Use: Never used  °Substance and Sexual Activity  ° Alcohol use: No  ° Drug use: No  ° Sexual activity: Not Currently  °Other Topics Concern  ° Not on file  °Social  History Narrative  ° Not on file  ° °Social Determinants of Health  ° °Financial Resource Strain: Not on file  °Food Insecurity: Not on file  °Transportation Needs: Not on file  °Physical Activity: Not on file  °Stress: Not on file  °Social Connections: Not on file  °Intimate Partner Violence: Not on file  ° ° °FAMILY HISTORY:  °Family History  °Problem Relation Age of Onset  ° Alzheimer's disease Mother   ° COPD Father   ° Emphysema Father   ° Hypertension Father   ° Healthy Sister   ° Healthy Brother   ° Alzheimer's disease Maternal Grandmother   ° Healthy Sister   ° Healthy Sister   ° Healthy Sister   ° Prostate cancer Other   °     paternal grandmother's brother; dx in early 60s  ° Breast cancer Neg Hx   ° ° °CURRENT MEDICATIONS:  °Current Outpatient Medications  °Medication Sig Dispense Refill  ° amLODipine (NORVASC) 5 MG tablet Take 5 mg by mouth at bedtime.     ° aspirin EC 81 MG tablet Take 81 mg by mouth daily.    ° CARBOPLATIN IV Inject into the vein every 21 ( twenty-one) days.    ° doxycycline (VIBRA-TABS) 100 MG tablet Take 100 mg by mouth 2 (two) times   daily.     ferrous sulfate 325 (65 FE) MG tablet Take 325 mg by mouth every other day.     Fluticasone-Umeclidin-Vilant (TRELEGY ELLIPTA) 100-62.5-25 MCG/ACT AEPB Inhale into the lungs.     gabapentin (NEURONTIN) 600 MG tablet Take 600 mg by mouth 2 (two) times daily.     magnesium oxide (MAG-OX) 400 (240 Mg) MG tablet Take 1 tablet by mouth twice daily 120 tablet 0   magnesium oxide (MAG-OX) 400 (241.3 Mg) MG tablet Take 1 tablet (400 mg total) by mouth 2 (two) times daily. 60 tablet 3   niraparib tosylate (ZEJULA) 100 MG capsule Take 2 capsules (200 mg total) by mouth daily. May take at bedtime to reduce nausea and vomiting. 60 capsule 11   nortriptyline (PAMELOR) 25 MG capsule Take 25 mg by mouth at bedtime.     ondansetron (ZOFRAN ODT) 4 MG disintegrating tablet 94m ODT q4 hours prn nausea/vomit 12 tablet 0   OXYGEN Inhale 3 L into the lungs  continuous.      PACLITAXEL IV Inject into the vein every 21 ( twenty-one) days.     pantoprazole (PROTONIX) 40 MG tablet Take 1 tablet (40 mg total) by mouth daily. 30 tablet 2   predniSONE (DELTASONE) 20 MG tablet Take 40 mg by mouth daily.     simvastatin (ZOCOR) 40 MG tablet Take 40 mg by mouth at bedtime.     albuterol (PROVENTIL HFA;VENTOLIN HFA) 108 (90 BASE) MCG/ACT inhaler Inhale 4-6 puffs into the lungs every 6 (six) hours as needed for wheezing or shortness of breath. (Patient not taking: Reported on 11/05/2021)     ipratropium (ATROVENT HFA) 17 MCG/ACT inhaler Inhale 2 puffs into the lungs every 6 (six) hours as needed for wheezing. (Patient not taking: Reported on 11/05/2021)     ipratropium-albuterol (DUONEB) 0.5-2.5 (3) MG/3ML SOLN Take 0.5 mg by nebulization every 6 (six) hours as needed (COPD). (Patient not taking: Reported on 11/05/2021)     ondansetron (ZOFRAN) 8 MG tablet Take 1 tablet (8 mg total) by mouth every 8 (eight) hours as needed for nausea. (Patient not taking: Reported on 11/05/2021) 30 tablet 3   No current facility-administered medications for this visit.    ALLERGIES:  No Known Allergies  PHYSICAL EXAM:  Performance status (ECOG): 1 - Symptomatic but completely ambulatory  Vitals:   11/05/21 1318  BP: (!) 143/79  Pulse: (!) 120  Resp: 19  Temp: 98.8 F (37.1 C)  SpO2: 96%   Wt Readings from Last 3 Encounters:  11/05/21 118 lb 9.6 oz (53.8 kg)  09/27/21 118 lb 3.2 oz (53.6 kg)  06/20/21 117 lb 11.6 oz (53.4 kg)   Physical Exam Vitals reviewed.  Constitutional:      Appearance: Normal appearance.     Interventions: Nasal cannula in place.     Comments: In wheelchair  Cardiovascular:     Rate and Rhythm: Normal rate and regular rhythm.     Pulses: Normal pulses.     Heart sounds: Normal heart sounds.  Pulmonary:     Effort: Pulmonary effort is normal.     Comments: Bilateral hoarse breath sounds in bases Neurological:     General: No focal  deficit present.     Mental Status: She is alert and oriented to person, place, and time.  Psychiatric:        Mood and Affect: Mood normal.        Behavior: Behavior normal.     LABORATORY DATA:  I have  reviewed the labs as listed.  °CBC Latest Ref Rng & Units 10/30/2021 09/20/2021 06/20/2021  °WBC 4.0 - 10.5 K/uL 6.4 6.5 4.2  °Hemoglobin 12.0 - 15.0 g/dL 9.6(L) 8.9(L) 10.1(L)  °Hematocrit 36.0 - 46.0 % 29.7(L) 28.1(L) 30.6(L)  °Platelets 150 - 400 K/uL 116(L) 97(L) 54(L)  ° °CMP Latest Ref Rng & Units 10/30/2021 09/20/2021 06/20/2021  °Glucose 70 - 99 mg/dL 110(H) 115(H) 103(H)  °BUN 8 - 23 mg/dL 16 13 9  °Creatinine 0.44 - 1.00 mg/dL 0.60 0.75 0.64  °Sodium 135 - 145 mmol/L 134(L) 137 138  °Potassium 3.5 - 5.1 mmol/L 3.5 4.3 3.7  °Chloride 98 - 111 mmol/L 92(L) 94(L) 95(L)  °CO2 22 - 32 mmol/L 30 34(H) 32  °Calcium 8.9 - 10.3 mg/dL 8.8(L) 9.0 9.0  °Total Protein 6.5 - 8.1 g/dL 7.3 6.8 6.9  °Total Bilirubin 0.3 - 1.2 mg/dL 0.3 0.4 0.5  °Alkaline Phos 38 - 126 U/L 83 79 69  °AST 15 - 41 U/L 25 29 37  °ALT 0 - 44 U/L 20 20 32  ° ° °DIAGNOSTIC IMAGING:  °I have independently reviewed the scans and discussed with the patient. °DG Chest 2 View ° °Result Date: 11/03/2021 °CLINICAL DATA:  Obstructive chronic bronchitis with exacerbation. Shortness of breath for 2 weeks. EXAM: CHEST - 2 VIEW COMPARISON:  Most recent chest radiograph 02/16/2021. Chest CT 3 days ago 10/30/2021 reviewed FINDINGS: Left chest port remains in place. Emphysema and chronic hyperinflation. Slight chronic volume loss in the left hemithorax, unchanged. The heart is normal in size. Aortic atherosclerosis. Tree-in-bud nodularity in the left upper lobe on CT is not well-defined by radiograph. Right middle lobe nodule on CT is not well seen. No evidence of acute airspace disease, pulmonary edema, pleural effusion or pneumothorax. Bones are under mineralized without acute osseous abnormalities. IMPRESSION: 1. Emphysema and chronic hyperinflation. No  acute radiographic abnormality. 2. Tree-in-bud opacities on recent chest CT are not well seen by radiograph. Electronically Signed   By: Melanie  Sanford M.D.   On: 11/03/2021 11:36  ° °CT CHEST ABDOMEN PELVIS WO CONTRAST ° °Result Date: 10/31/2021 °CLINICAL DATA:  Ovarian cancer.  Restaging. EXAM: CT CHEST, ABDOMEN AND PELVIS WITHOUT CONTRAST TECHNIQUE: Multidetector CT imaging of the chest, abdomen and pelvis was performed following the standard protocol without IV contrast. COMPARISON:  05/04/2021 FINDINGS: CT CHEST FINDINGS Cardiovascular: Normal heart size. No pericardial effusion. Aortic atherosclerosis. Coronary artery calcifications. Mediastinum/Nodes: No enlarged mediastinal, hilar, or axillary lymph nodes. Thyroid gland, trachea, and esophagus demonstrate no significant findings. Lungs/Pleura: No pleural effusion, no pleural effusion. Moderate to severe centrilobular and paraseptal emphysema. Biapical pleuroparenchymal scarring is again noted and appears similar to the previous exam. Diffuse bronchial wall thickening identified. Within the anterior and medial left upper lobe there are progressive areas tree-in-bud nodularity and nodular architectural distortion the appearance of which is concerning for chronic inflammatory/infectious process. Tiny nodule in the left upper lobe referenced on the previous exam measuring 1-2 mm is stable, image 37/3. Adjacent small nodule in the anterior left upper lobe measuring 2-3 mm is also unchanged, image 38/3. New nodular density within the inferior right middle lobe measuring 0.9 cm, image 104/3. Musculoskeletal: No chest wall mass or suspicious bone lesions identified. CT ABDOMEN PELVIS FINDINGS Hepatobiliary: No focal liver abnormality is seen. No gallstones, gallbladder wall thickening, or biliary dilatation. Pancreas: Unremarkable. No pancreatic ductal dilatation or surrounding inflammatory changes. Spleen: Normal in size without focal abnormality.  Adrenals/Urinary Tract: Normal adrenal glands. No kidney mass, nephrolithiasis or   hydronephrosis. Urinary bladder appears unremarkable. Stomach/Bowel: Small hiatal hernia. The appendix is visualized and appears normal. Colonic diverticulosis noted without signs of acute diverticulitis. No bowel wall thickening, inflammation, or distension. Vascular/Lymphatic: Aortic atherosclerosis. No aneurysm. No abdominopelvic adenopathy. Reproductive: Partially calcified fibroid uterus is identified. No adnexal mass. Other: There is no ascites or discrete fluid collections. Motion artifact diminishes exam detail within the ventral abdomen at the level of the omentum. Within this limitation there is mild soft tissue infiltration within the omentum, image 69/2. This is stable to improved in the interval. No signs to suggest progression of disease. Musculoskeletal: No acute or significant osseous findings. IMPRESSION: 1. Similar to improved appearance of mild soft tissue infiltration within the omentum secondary to peritoneal carcinomatosis. No signs to suggest progression of disease. 2. There has been interval progression of tree-in-bud nodularity and nodular architectural distortion within the anterior and medial left upper lobe. Additionally new nodular density within the inferior right middle lobe measures 0.9 cm. Findings are concerning for acute on chronic inflammatory/infectious process. Recommend short-term interval follow-up with repeat CT of the chest following appropriate antibiotic therapy. 3. Aortic Atherosclerosis (ICD10-I70.0) and Emphysema (ICD10-J43.9). Electronically Signed   By: Taylor  Stroud M.D.   On: 10/31/2021 13:08    ° °ASSESSMENT:  °1.  Peritoneal carcinomatosis: °-Presentation to the ER with right lower quadrant abdominal pain on and off for 1 month. °-CTAP on 05/29/2020 showed extensive nodularity throughout the omentum and upper peritoneal cavity.  Both ovaries are prominent although no well-defined  mass is noted.  Small volume ascites. °-CA-125 is elevated at 2407.  CEA was 2.4. °-CT of the chest shows 11 mm right paratracheal lymph node and 11 mm short axis precarinal lymph node.  Subcarinal lymphadenopathy measuring 14 mm short axis.  This is suspicious for metastatic disease. °-Needle biopsy of the omentum on 06/13/2020 shows high-grade adenocarcinoma, morphology and IHC consistent with high-grade gynecological adenocarcinoma including high-grade ovarian serous carcinoma. °-Cycle 1 of carboplatin and paclitaxel on 07/05/2020. °-Evaluated by Dr. Rossi on 07/07/2020.  Reevaluation after 3 cycles. °-CT CAP from 09/01/2020 showed mild improvement with reduction in the omental caking.  Stable to minimally reduced adenopathy in the chest and abdomen.  New 4 mm right upper lobe lung nodule could be inflammatory. °-CT CAP on 11/07/2019 and after 6 cycles showed improved peritoneal disease/omental caking.  9 mm short axis portacaval lymph node and adjacent upper abdominal lymph nodes measuring 12 mm in short axis.  Small retroperitoneal lymph nodes up to 5 mm improved.  Peritoneal disease/omental caking now measures 2.4 cm in thickness. °-CA-125 on 10/18/2020 improved 52. °-Plan for surgical resection after 6 cycles.  Unfortunately she was not cleared for surgery by anesthesia/pulmonary. °- CT CAP on 02/12/2021 showed peritoneal disease and abdominal lymph nodes have improved.  However new masslike areas in the left upper lobe, new since January 31 CT scan along with borderline enlargement of the left supraclavicular lymph node. °- She underwent bronchoscopy and biopsy on 03/09/2021 which was benign. °- Niraparib 200 mg daily started around 05/25/2021. °  °2.  COPD: °-Quit smoking 6 years ago.  Smoked 2 packs/day for 25-30 years. °-Has been on 3 L/min oxygen via nasal cannula for the last 5 years. °  °3.  Family history: °-Paternal grandmother with colon cancer. ° ° °PLAN:  °1.  High-grade serous ovarian carcinoma: °- She  started niraparib 200 mg in July 2022. °- No major GI symptoms or skin rashes noted. °- She is having COPD exacerbation and   is currently on prednisone 20 mg until tomorrow.  Then she will go back on her maintenance prednisone 10 mg daily.  She is seeing Dr. Fleming next week. °- Reviewed CT CAP from 10/31/2021 which showed similar appearance of mild soft tissue infiltration within the omentum secondary to peritoneal carcinomatosis.  No signs to suggest progressive disease.  Interval progression of tree-in-bud nodularity within the anterior and medial left upper lobe.  New nodular density within the inferior right middle lobe.  Findings concerning for acute on chronic inflammatory/infectious process.  She will follow-up with Dr. Fleming about the pulmonary findings. °- We reviewed labs from 10/30/2021 which showed normal LFTs.  Electrolytes and creatinine was normal.  CBC showed macrocytic anemia with hemoglobin 9.6.  Platelet count is 116 and normal white count. °- We reviewed anemia labs which showed normal B12, methylmalonic acid and copper levels.  Ferritin was 824 and folic acid was normal.  Last CA125 was 31. °- Anemia likely from myelosuppression from neuropathy. °- Continue same dose of niraparib 200 mg daily.  RTC 1 month for follow-up with repeat tumor marker, CBC and CMP. °  °2.  Family history: °- Germline mutation testing is negative. °  °3.  Peripheral neuropathy: °- Continue gabapentin 300 mg 3 times daily. °  °4.  Severe COPD: °- Continue prednisone, Trelegy and Xopenex. °  °5.  Hypomagnesemia: °- Continue magnesium twice daily. °  °Orders placed this encounter:  °No orders of the defined types were placed in this encounter. ° ° ° °Sreedhar Katragadda, MD °[facility] °[phone] ° ° °I, Kirstyn Evans, am acting as a scribe for Dr. Sreedhar Katragadda. ° °I, Sreedhar Katragadda MD, have reviewed the above documentation for accuracy and completeness, and I agree with the above. °  ° ° °

## 2021-11-05 ENCOUNTER — Other Ambulatory Visit: Payer: Self-pay

## 2021-11-05 ENCOUNTER — Inpatient Hospital Stay (HOSPITAL_BASED_OUTPATIENT_CLINIC_OR_DEPARTMENT_OTHER): Payer: Medicare HMO | Admitting: Hematology

## 2021-11-05 VITALS — BP 143/79 | HR 120 | Temp 98.8°F | Resp 19 | Ht 60.0 in | Wt 118.6 lb

## 2021-11-05 DIAGNOSIS — D539 Nutritional anemia, unspecified: Secondary | ICD-10-CM

## 2021-11-05 DIAGNOSIS — I10 Essential (primary) hypertension: Secondary | ICD-10-CM | POA: Diagnosis not present

## 2021-11-05 DIAGNOSIS — Z87891 Personal history of nicotine dependence: Secondary | ICD-10-CM | POA: Diagnosis not present

## 2021-11-05 DIAGNOSIS — C569 Malignant neoplasm of unspecified ovary: Secondary | ICD-10-CM

## 2021-11-05 DIAGNOSIS — C786 Secondary malignant neoplasm of retroperitoneum and peritoneum: Secondary | ICD-10-CM | POA: Diagnosis not present

## 2021-11-05 DIAGNOSIS — J449 Chronic obstructive pulmonary disease, unspecified: Secondary | ICD-10-CM | POA: Diagnosis not present

## 2021-11-05 DIAGNOSIS — J9611 Chronic respiratory failure with hypoxia: Secondary | ICD-10-CM | POA: Diagnosis not present

## 2021-11-05 DIAGNOSIS — D649 Anemia, unspecified: Secondary | ICD-10-CM | POA: Diagnosis not present

## 2021-11-05 DIAGNOSIS — G629 Polyneuropathy, unspecified: Secondary | ICD-10-CM | POA: Diagnosis not present

## 2021-11-05 NOTE — Patient Instructions (Signed)
[place] at [facility] Discharge Instructions   You were seen and examined today by [name]. He reviewed your lab work and CT scan which had normal/stable results. Return as scheduled in 4 weeks.   Thank you for choosing [place] at [facility] to provide your oncology and hematology care.  To afford each patient quality time with our provider, please arrive at least 15 minutes before your scheduled appointment time.   If you have a lab appointment with the [facility] please come in thru the Main Entrance and check in at the main information desk.  You need to re-schedule your appointment should you arrive 10 or more minutes late.  We strive to give you quality time with our providers, and arriving late affects you and other patients whose appointments are after yours.  Also, if you no show three or more times for appointments you may be dismissed from the clinic at the providers discretion.     Again, thank you for choosing [facility].  Our hope is that these requests will decrease the amount of time that you wait before being seen by our physicians.       _____________________________________________________________  Should you have questions after your visit to [facility], please contact our office at [phone] and follow the prompts.  Our office hours are 8:00 a.m. and 4:30 p.m. Monday - Friday.  Please note that voicemails left after 4:00 p.m. may not be returned until the following business day.  We are closed weekends and major holidays.  You do have access to a nurse 24-7, just call the main number to the clinic [phone] and do not press any options, hold on the line and a nurse will answer the phone.    For prescription refill requests, have your pharmacy contact our office and allow 72 hours.    Due to Covid, you will need to wear a mask upon entering the hospital. If you do not have a  mask, a mask will be given to you at the Main Entrance upon arrival. For doctor visits, patients may have 1 support person age 62 or older with them. For treatment visits, patients can not have anyone with them due to social distancing guidelines and our immunocompromised population.

## 2021-11-16 ENCOUNTER — Other Ambulatory Visit (HOSPITAL_COMMUNITY): Payer: Self-pay

## 2021-11-19 ENCOUNTER — Other Ambulatory Visit (HOSPITAL_COMMUNITY): Payer: Self-pay

## 2021-11-22 ENCOUNTER — Other Ambulatory Visit (HOSPITAL_COMMUNITY): Payer: Self-pay

## 2021-12-04 ENCOUNTER — Other Ambulatory Visit (HOSPITAL_COMMUNITY): Payer: Self-pay

## 2021-12-05 ENCOUNTER — Inpatient Hospital Stay (HOSPITAL_COMMUNITY): Payer: Medicare HMO | Attending: Hematology

## 2021-12-05 DIAGNOSIS — C569 Malignant neoplasm of unspecified ovary: Secondary | ICD-10-CM | POA: Diagnosis present

## 2021-12-05 DIAGNOSIS — D696 Thrombocytopenia, unspecified: Secondary | ICD-10-CM | POA: Diagnosis not present

## 2021-12-05 DIAGNOSIS — I10 Essential (primary) hypertension: Secondary | ICD-10-CM | POA: Diagnosis not present

## 2021-12-05 DIAGNOSIS — R590 Localized enlarged lymph nodes: Secondary | ICD-10-CM | POA: Diagnosis not present

## 2021-12-05 DIAGNOSIS — C786 Secondary malignant neoplasm of retroperitoneum and peritoneum: Secondary | ICD-10-CM | POA: Diagnosis not present

## 2021-12-05 DIAGNOSIS — R188 Other ascites: Secondary | ICD-10-CM | POA: Diagnosis not present

## 2021-12-05 DIAGNOSIS — G629 Polyneuropathy, unspecified: Secondary | ICD-10-CM | POA: Insufficient documentation

## 2021-12-05 DIAGNOSIS — J449 Chronic obstructive pulmonary disease, unspecified: Secondary | ICD-10-CM | POA: Insufficient documentation

## 2021-12-05 DIAGNOSIS — Z8042 Family history of malignant neoplasm of prostate: Secondary | ICD-10-CM | POA: Diagnosis not present

## 2021-12-05 DIAGNOSIS — Z87891 Personal history of nicotine dependence: Secondary | ICD-10-CM | POA: Diagnosis not present

## 2021-12-05 LAB — COMPREHENSIVE METABOLIC PANEL
ALT: 16 U/L (ref 0–44)
AST: 24 U/L (ref 15–41)
Albumin: 3.9 g/dL (ref 3.5–5.0)
Alkaline Phosphatase: 70 U/L (ref 38–126)
Anion gap: 11 (ref 5–15)
BUN: 9 mg/dL (ref 8–23)
CO2: 32 mmol/L (ref 22–32)
Calcium: 8.9 mg/dL (ref 8.9–10.3)
Chloride: 96 mmol/L — ABNORMAL LOW (ref 98–111)
Creatinine, Ser: 0.74 mg/dL (ref 0.44–1.00)
GFR, Estimated: >60 mL/min (ref >60)
Glucose, Bld: 99 mg/dL (ref 70–99)
Potassium: 3.5 mmol/L (ref 3.5–5.1)
Sodium: 139 mmol/L (ref 135–145)
Total Bilirubin: 0.5 mg/dL (ref 0.3–1.2)
Total Protein: 6.8 g/dL (ref 6.5–8.1)

## 2021-12-05 LAB — CBC WITH DIFFERENTIAL/PLATELET
Abs Immature Granulocytes: 0.02 10*3/uL (ref 0.00–0.07)
Basophils Absolute: 0.1 10*3/uL (ref 0.0–0.1)
Basophils Relative: 1 %
Eosinophils Absolute: 0.1 10*3/uL (ref 0.0–0.5)
Eosinophils Relative: 1 %
HCT: 32.6 % — ABNORMAL LOW (ref 36.0–46.0)
Hemoglobin: 10.3 g/dL — ABNORMAL LOW (ref 12.0–15.0)
Immature Granulocytes: 0 %
Lymphocytes Relative: 26 %
Lymphs Abs: 1.3 10*3/uL (ref 0.7–4.0)
MCH: 33 pg (ref 26.0–34.0)
MCHC: 31.6 g/dL (ref 30.0–36.0)
MCV: 104.5 fL — ABNORMAL HIGH (ref 80.0–100.0)
Monocytes Absolute: 0.8 10*3/uL (ref 0.1–1.0)
Monocytes Relative: 15 %
Neutro Abs: 2.8 10*3/uL (ref 1.7–7.7)
Neutrophils Relative %: 57 %
Platelets: 143 10*3/uL — ABNORMAL LOW (ref 150–400)
RBC: 3.12 MIL/uL — ABNORMAL LOW (ref 3.87–5.11)
RDW: 14.8 % (ref 11.5–15.5)
WBC: 5.1 10*3/uL (ref 4.0–10.5)
nRBC: 0 % (ref 0.0–0.2)

## 2021-12-06 LAB — CA 125: Cancer Antigen (CA) 125: 43.9 U/mL — ABNORMAL HIGH (ref 0.0–38.1)

## 2021-12-10 ENCOUNTER — Inpatient Hospital Stay (HOSPITAL_COMMUNITY): Payer: Medicare HMO | Admitting: Hematology

## 2021-12-10 ENCOUNTER — Other Ambulatory Visit: Payer: Self-pay

## 2021-12-10 DIAGNOSIS — C786 Secondary malignant neoplasm of retroperitoneum and peritoneum: Secondary | ICD-10-CM

## 2021-12-10 DIAGNOSIS — D539 Nutritional anemia, unspecified: Secondary | ICD-10-CM | POA: Diagnosis not present

## 2021-12-10 DIAGNOSIS — C569 Malignant neoplasm of unspecified ovary: Secondary | ICD-10-CM | POA: Diagnosis not present

## 2021-12-10 DIAGNOSIS — J9611 Chronic respiratory failure with hypoxia: Secondary | ICD-10-CM

## 2021-12-10 NOTE — Progress Notes (Signed)
Patient is taking naraparib as prescribed.  She has not missed any doses and reports no side effects at this time.

## 2021-12-10 NOTE — Patient Instructions (Addendum)
[place] at [facility] Discharge Instructions   You were seen and examined today by [name].  He reviewed the results of your lab work.  Your cancer marker has gone up. Continue the chemo pill at this time.  We will see you back in 7 weeks and repeat a CT scan and tumor marker at that time.  Return as scheduled.    Thank you for choosing [place] at [facility] to provide your oncology and hematology care.  To afford each patient quality time with our provider, please arrive at least 15 minutes before your scheduled appointment time.   If you have a lab appointment with the [facility] please come in thru the Main Entrance and check in at the main information desk.  You need to re-schedule your appointment should you arrive 10 or more minutes late.  We strive to give you quality time with our providers, and arriving late affects you and other patients whose appointments are after yours.  Also, if you no show three or more times for appointments you may be dismissed from the clinic at the providers discretion.     Again, thank you for choosing [facility].  Our hope is that these requests will decrease the amount of time that you wait before being seen by our physicians.       _____________________________________________________________  Should you have questions after your visit to [facility], please contact our office at [phone] and follow the prompts.  Our office hours are 8:00 a.m. and 4:30 p.m. Monday - Friday.  Please note that voicemails left after 4:00 p.m. may not be returned until the following business day.  We are closed weekends and major holidays.  You do have access to a nurse 24-7, just call the main number to the clinic [phone] and do not press any options, hold on the line and a nurse will answer the phone.    For prescription refill requests, have your pharmacy contact our office  and allow 72 hours.    Due to Covid, you will need to wear a mask upon entering the hospital. If you do not have a mask, a mask will be given to you at the Main Entrance upon arrival. For doctor visits, patients may have 1 support person age 45 or older with them. For treatment visits, patients can not have anyone with them due to social distancing guidelines and our immunocompromised population.

## 2021-12-10 NOTE — Progress Notes (Signed)
[name], [name] [id number]   CLINIC:  Medical Oncology/Hematology  PCP:  [name], [place] / [place] [address] [phone]   REASON FOR VISIT:  Follow-up for high-grade serous ovarian carcinoma  PRIOR THERAPY: none  NGS Results: BRCA 1/2 negative  CURRENT THERAPY: Carboplatin, paclitaxel & Aloxi every 3 weeks  BRIEF ONCOLOGIC HISTORY:  Oncology History  Peritoneal carcinomatosis ([place])  06/06/2020 Initial Diagnosis   Peritoneal carcinomatosis ([place])   07/05/2020 -  Chemotherapy    Patient is on Treatment Plan: OVARIAN CARBOPLATIN (AUC 6) / PACLITAXEL (175) Q21D X 6 CYCLES       07/22/2020 Genetic Testing   Negative genetic testing: no pathogenic variants detected in [facility] Expanded CancerNext Panel + RNAInsight. The CancerNext-Expanded gene panel offered by [facility] and includes sequencing and rearrangement analysis for the following 77 genes: AIP, ALK, APC*, ATM*, AXIN2, BAP1, BARD1, BLM, BMPR1A, BRCA1*, BRCA2*, BRIP1*, CDC73, CDH1*, CDK4, CDKN1B, CDKN2A, CHEK2*, CTNNA1, DICER1, FANCC, FH, FLCN, GALNT12, KIF1B, LZTR1, MAX, MEN1, MET, MLH1*, MSH2*, MSH3, MSH6*, MUTYH*, NBN, NF1*, NF2, NTHL1, PALB2*, PHOX2B, PMS2*, POT1, PRKAR1A, PTCH1, PTEN*, RAD51C*, RAD51D*, RB1, RECQL, RET, SDHA, SDHAF2, SDHB, SDHC, SDHD, SMAD4, SMARCA4, SMARCB1, SMARCE1, STK11, SUFU, TMEM127, TP53*, TSC1, TSC2, VHL and XRCC2 (sequencing and deletion/duplication); EGFR, EGLN1, HOXB13, KIT, MITF, PDGFRA, POLD1, and POLE (sequencing only); EPCAM and GREM1 (deletion/duplication only). DNA and RNA analyses performed for * genes. The report date is July 22, 2020.    Ovarian cancer ([place])  07/07/2020 Initial Diagnosis   Ovarian cancer ([place])   05/04/2021 Imaging   1. No findings today to suggest new or progressive disease related to ovarian cancer. 2. Interval near complete resolution of the irregular and nodular areas of consolidative left upper  lobe opacity seen on the previous study. Residual architectural distortion in this region is compatible with scarring. Tiny nodules persist in the left upper lobe and attention on follow-up recommended. 3. Interval improvement in tree-in-bud nodularity anteromedial left upper lobe. Changes of bronchial wall thickening and mild cylindrical bronchiectasis in the lower lungs are similar to prior with persistent tree-in-bud nodularity posterior left lower lobe. Imaging features suggest sequelae of atypical infection. 4. Similar appearance of the omental soft tissue stranding with less prominent nodularity on today's study. 5. No ascites. 6. Mild circumferential wall thickening distal esophagus. Esophagitis could have this appearance. 7. Left colonic diverticulosis without diverticulitis. 8. Aortic Atherosclerosis (ICD10-I70.0) and Emphysema (ICD10-J43.9).   05/30/2021 -  Chemotherapy   She takes niraparib         CANCER STAGING:  Cancer Staging  No matching staging information was found for the patient.  INTERVAL HISTORY:  [name], a 66 y.o. female, returns for routine follow-up of her high-grade serous ovarian carcinoma. [name] was last seen on 11/05/2021.   Today she reports feeling good. She denies n/v/d and skin rash. Her SOB is at baseline.   REVIEW OF SYSTEMS:  Review of Systems  Constitutional:  Negative for appetite change and fatigue.  Respiratory:  Positive for shortness of breath (stable).   Gastrointestinal:  Negative for diarrhea, nausea and vomiting.  Skin:  Negative for rash.  All other systems reviewed and are negative.  PAST MEDICAL/SURGICAL HISTORY:  Past Medical History:  Diagnosis Date   Anemia    Aortic atherosclerosis (HCC)    Arthritis    Asthma    Bilateral carotid artery stenosis    Cancer ([place])    Gastric Cancer  COPD (chronic obstructive pulmonary disease) (HCC)    High cholesterol    Hyperlipidemia    Hypertension    Neuropathy     Osteoarthritis    Ovarian cancer ([place])    Oxygen dependent    Peripheral vascular disease ([place])    Peritoneal carcinoma ([place])    Pneumonia 2019   Port-A-Cath in place 07/03/2020   Pulmonary emphysema [facility])    Past Surgical History:  Procedure Laterality Date   ESOPHAGOGASTRODUODENOSCOPY (EGD) WITH PROPOFOL N/A 02/17/2021   Procedure: ESOPHAGOGASTRODUODENOSCOPY (EGD) WITH PROPOFOL;  Surgeon: Harvel Quale, MD;  Location: [facility];  Service: Gastroenterology;  Laterality: N/A;   HALLUX VALGUS BASE WEDGE Right 06/09/2015   Procedure: Base wedge osteotomy with modified McBride right foot ;  Surgeon: Sharlotte Alamo, MD;  Location: [facility] ORS;  Service: Podiatry;  Laterality: Right;   PORTACATH PLACEMENT Left 06/28/2020   Procedure: PORT-A-CATHETER PLACEMENT LEFT CHEST (attached catheter in left subclavian);  Surgeon: Virl Cagey, MD;  Location: [facility] ORS;  Service: General;  Laterality: Left;   TUBAL LIGATION     VIDEO BRONCHOSCOPY WITH ENDOBRONCHIAL NAVIGATION N/A 03/09/2021   Procedure: VIDEO BRONCHOSCOPY WITH ENDOBRONCHIAL NAVIGATION;  Surgeon: Ottie Glazier, MD;  Location: [facility] ORS;  Service: Thoracic;  Laterality: N/A;   VIDEO BRONCHOSCOPY WITH ENDOBRONCHIAL ULTRASOUND N/A 03/09/2021   Procedure: VIDEO BRONCHOSCOPY WITH ENDOBRONCHIAL ULTRASOUND;  Surgeon: Ottie Glazier, MD;  Location: [facility] ORS;  Service: Thoracic;  Laterality: N/A;    SOCIAL HISTORY:  Social History   Socioeconomic History   Marital status: Divorced    Spouse name: Not on file   Number of children: 3   Years of education: Not on file   Highest education level: Not on file  Occupational History   Occupation: DISABLED  Tobacco Use   Smoking status: Former    Packs/day: 2.00    Years: 30.00    Pack years: 60.00    Types: Cigarettes    Quit date: 11/04/2013    Years since quitting: 8.1   Smokeless tobacco: Never  Vaping Use   Vaping Use: Never used  Substance and Sexual Activity   Alcohol use:  No   Drug use: No   Sexual activity: Not Currently  Other Topics Concern   Not on file  Social History Narrative   Not on file   Social Determinants of Health   Financial Resource Strain: Not on file  Food Insecurity: Not on file  Transportation Needs: Not on file  Physical Activity: Not on file  Stress: Not on file  Social Connections: Not on file  Intimate Partner Violence: Not on file    FAMILY HISTORY:  Family History  Problem Relation Age of Onset   Alzheimer's disease Mother    COPD Father    Emphysema Father    Hypertension Father    Healthy Sister    Healthy Brother    Alzheimer's disease Maternal Grandmother    Healthy Sister    Healthy Sister    Healthy Sister    Prostate cancer Other        paternal grandmother's brother; dx in early 74s   Breast cancer Neg Hx     CURRENT MEDICATIONS:  Current Outpatient Medications  Medication Sig Dispense Refill   amLODipine (NORVASC) 5 MG tablet Take 5 mg by mouth at bedtime.      aspirin EC 81 MG tablet Take 81 mg by mouth daily.     CARBOPLATIN IV Inject into the vein every 21 ( twenty-one)  days.     doxycycline (VIBRA-TABS) 100 MG tablet Take 100 mg by mouth 2 (two) times daily.     ferrous sulfate 325 (65 FE) MG tablet Take 325 mg by mouth every other day.     Fluticasone-Umeclidin-Vilant (TRELEGY ELLIPTA) 100-62.5-25 MCG/ACT AEPB Inhale into the lungs.     gabapentin (NEURONTIN) 600 MG tablet Take 600 mg by mouth 2 (two) times daily.     levalbuterol (XOPENEX) 1.25 MG/3ML nebulizer solution Inhale into the lungs.     magnesium oxide (MAG-OX) 400 (240 Mg) MG tablet Take 1 tablet by mouth twice daily 120 tablet 0   magnesium oxide (MAG-OX) 400 (241.3 Mg) MG tablet Take 1 tablet (400 mg total) by mouth 2 (two) times daily. 60 tablet 3   niraparib tosylate (ZEJULA) 100 MG capsule Take 2 capsules (200 mg total) by mouth daily. May take at bedtime to reduce nausea and vomiting. 60 capsule 11   nortriptyline (PAMELOR) 25  MG capsule Take 25 mg by mouth at bedtime.     ondansetron (ZOFRAN ODT) 4 MG disintegrating tablet 90m ODT q4 hours prn nausea/vomit 12 tablet 0   OXYGEN Inhale 3 L into the lungs continuous.      PACLITAXEL IV Inject into the vein every 21 ( twenty-one) days.     pantoprazole (PROTONIX) 40 MG tablet Take 1 tablet (40 mg total) by mouth daily. 30 tablet 2   predniSONE (DELTASONE) 20 MG tablet Take 40 mg by mouth daily.     simvastatin (ZOCOR) 40 MG tablet Take 40 mg by mouth at bedtime.     albuterol (PROVENTIL HFA;VENTOLIN HFA) 108 (90 BASE) MCG/ACT inhaler Inhale 4-6 puffs into the lungs every 6 (six) hours as needed for wheezing or shortness of breath. (Patient not taking: Reported on 12/10/2021)     ipratropium (ATROVENT HFA) 17 MCG/ACT inhaler Inhale 2 puffs into the lungs every 6 (six) hours as needed for wheezing. (Patient not taking: Reported on 12/10/2021)     ipratropium-albuterol (DUONEB) 0.5-2.5 (3) MG/3ML SOLN Take 0.5 mg by nebulization every 6 (six) hours as needed (COPD). (Patient not taking: Reported on 12/10/2021)     ondansetron (ZOFRAN) 8 MG tablet Take 1 tablet (8 mg total) by mouth every 8 (eight) hours as needed for nausea. (Patient not taking: Reported on 12/10/2021) 30 tablet 3   No current facility-administered medications for this visit.    ALLERGIES:  No Known Allergies  PHYSICAL EXAM:  Performance status (ECOG): 1 - Symptomatic but completely ambulatory  There were no vitals filed for this visit. Wt Readings from Last 3 Encounters:  11/05/21 118 lb 9.6 oz (53.8 kg)  09/27/21 118 lb 3.2 oz (53.6 kg)  06/20/21 117 lb 11.6 oz (53.4 kg)   Physical Exam Vitals reviewed.  Constitutional:      Appearance: Normal appearance.     Interventions: Nasal cannula in place.     Comments: In wheelchair  Cardiovascular:     Rate and Rhythm: Normal rate and regular rhythm.     Pulses: Normal pulses.     Heart sounds: Normal heart sounds.  Pulmonary:     Effort: Pulmonary  effort is normal.     Breath sounds: Examination of the right-lower field reveals rales. Examination of the left-lower field reveals rales. Rales present.  Neurological:     General: No focal deficit present.     Mental Status: She is alert and oriented to person, place, and time.  Psychiatric:  Mood and Affect: Mood normal.        Behavior: Behavior normal.     LABORATORY DATA:  I have reviewed the labs as listed.  CBC Latest Ref Rng & Units 12/05/2021 10/30/2021 09/20/2021  WBC 4.0 - 10.5 K/uL 5.1 6.4 6.5  Hemoglobin 12.0 - 15.0 g/dL 10.3(L) 9.6(L) 8.9(L)  Hematocrit 36.0 - 46.0 % 32.6(L) 29.7(L) 28.1(L)  Platelets 150 - 400 K/uL 143(L) 116(L) 97(L)   CMP Latest Ref Rng & Units 12/05/2021 10/30/2021 09/20/2021  Glucose 70 - 99 mg/dL 99 110(H) 115(H)  BUN 8 - 23 mg/dL '9 16 13  ' Creatinine 0.44 - 1.00 mg/dL 0.74 0.60 0.75  Sodium 135 - 145 mmol/L 139 134(L) 137  Potassium 3.5 - 5.1 mmol/L 3.5 3.5 4.3  Chloride 98 - 111 mmol/L 96(L) 92(L) 94(L)  CO2 22 - 32 mmol/L 32 30 34(H)  Calcium 8.9 - 10.3 mg/dL 8.9 8.8(L) 9.0  Total Protein 6.5 - 8.1 g/dL 6.8 7.3 6.8  Total Bilirubin 0.3 - 1.2 mg/dL 0.5 0.3 0.4  Alkaline Phos 38 - 126 U/L 70 83 79  AST 15 - 41 U/L '24 25 29  ' ALT 0 - 44 U/L '16 20 20    ' DIAGNOSTIC IMAGING:  I have independently reviewed the scans and discussed with the patient. No results found.   ASSESSMENT:  1.  Peritoneal carcinomatosis: -Presentation to the ER with right lower quadrant abdominal pain on and off for 1 month. -CTAP on 05/29/2020 showed extensive nodularity throughout the omentum and upper peritoneal cavity.  Both ovaries are prominent although no well-defined mass is noted.  Small volume ascites. -CA-125 is elevated at 2407.  CEA was 2.4. -CT of the chest shows 11 mm right paratracheal lymph node and 11 mm short axis precarinal lymph node.  Subcarinal lymphadenopathy measuring 14 mm short axis.  This is suspicious for metastatic disease. -Needle  biopsy of the omentum on 06/13/2020 shows high-grade adenocarcinoma, morphology and IHC consistent with high-grade gynecological adenocarcinoma including high-grade ovarian serous carcinoma. -Cycle 1 of carboplatin and paclitaxel on 07/05/2020. -Evaluated by Dr. Denman George on 07/07/2020.  Reevaluation after 3 cycles. -CT CAP from 09/01/2020 showed mild improvement with reduction in the omental caking.  Stable to minimally reduced adenopathy in the chest and abdomen.  New 4 mm right upper lobe lung nodule could be inflammatory. -CT CAP on 11/07/2019 and after 6 cycles showed improved peritoneal disease/omental caking.  9 mm short axis portacaval lymph node and adjacent upper abdominal lymph nodes measuring 12 mm in short axis.  Small retroperitoneal lymph nodes up to 5 mm improved.  Peritoneal disease/omental caking now measures 2.4 cm in thickness. -CA-125 on 10/18/2020 improved 52. -Plan for surgical resection after 6 cycles.  Unfortunately she was not cleared for surgery by anesthesia/pulmonary. - CT CAP on 02/12/2021 showed peritoneal disease and abdominal lymph nodes have improved.  However new masslike areas in the left upper lobe, new since January 31 CT scan along with borderline enlargement of the left supraclavicular lymph node. - She underwent bronchoscopy and biopsy on 03/09/2021 which was benign. - Niraparib 200 mg daily started around 05/25/2021.   2.  COPD: -Quit smoking 6 years ago.  Smoked 2 packs/day for 25-30 years. -Has been on 3 L/min oxygen via nasal cannula for the last 5 years.   3.  Family history: -Paternal grandmother with colon cancer.   PLAN:  1.  High-grade serous ovarian carcinoma: - She is tolerating niraparib 200 mg daily very well. - CT CAP from  10/31/2021 showed stable appearance of omentum soft tissue infiltration due to peritoneal carcinomatosis.  No signs of progressive disease. - Reviewed labs from 12/05/2021 which showed normal LFTs.  CBC shows hemoglobin 10.3 with MCV  104.  Mild thrombocytopenia of 143. - CA125 has increased to 43.9, previously 31 on 09/20/2021. - She does not report any abdominal pains. - We will continue niraparib at the same dose.  We will see her back in 7 weeks with repeat CT CAP and tumor marker.    2.  Family history: - Germline mutation testing is negative.   3.  Peripheral neuropathy: - Continue gabapentin 300 mg 3 times daily.  Symptoms well controlled.   4.  Severe COPD: - Continue prednisone, Trelegy and Xopenex.   5.  Hypomagnesemia: - Continue magnesium twice daily.     Orders placed this encounter:  No orders of the defined types were placed in this encounter.    Derek Jack, MD [place] [phone]   I, Thana Ates, am acting as a scribe for Dr. Derek Jack.  I, Derek Jack MD, have reviewed the above documentation for accuracy and completeness, and I agree with the above.

## 2021-12-14 ENCOUNTER — Other Ambulatory Visit: Payer: Self-pay | Admitting: Neurology

## 2021-12-14 DIAGNOSIS — C799 Secondary malignant neoplasm of unspecified site: Secondary | ICD-10-CM

## 2021-12-19 ENCOUNTER — Other Ambulatory Visit (HOSPITAL_COMMUNITY): Payer: Self-pay

## 2021-12-20 ENCOUNTER — Other Ambulatory Visit (HOSPITAL_COMMUNITY): Payer: Self-pay

## 2021-12-25 ENCOUNTER — Other Ambulatory Visit (HOSPITAL_COMMUNITY): Payer: Self-pay | Admitting: *Deleted

## 2021-12-25 ENCOUNTER — Other Ambulatory Visit (HOSPITAL_COMMUNITY): Payer: Self-pay

## 2021-12-25 DIAGNOSIS — C569 Malignant neoplasm of unspecified ovary: Secondary | ICD-10-CM

## 2021-12-25 MED ORDER — MAGNESIUM OXIDE -MG SUPPLEMENT 400 (240 MG) MG PO TABS: 1.0000 | ORAL_TABLET | Freq: Two times a day (BID) | ORAL | 0 refills | Status: DC

## 2021-12-30 ENCOUNTER — Other Ambulatory Visit: Payer: Self-pay

## 2021-12-30 ENCOUNTER — Inpatient Hospital Stay (HOSPITAL_COMMUNITY)
Admission: EM | Admit: 2021-12-30 | Discharge: 2022-01-01 | DRG: 193 | Disposition: A | Discharge status: Home / Self Care | Payer: Medicare HMO | Attending: Internal Medicine | Admitting: Internal Medicine

## 2021-12-30 ENCOUNTER — Encounter (HOSPITAL_COMMUNITY): Payer: Self-pay

## 2021-12-30 ENCOUNTER — Emergency Department (HOSPITAL_COMMUNITY): Payer: Medicare HMO

## 2021-12-30 DIAGNOSIS — Z7982 Long term (current) use of aspirin: Secondary | ICD-10-CM | POA: Diagnosis not present

## 2021-12-30 DIAGNOSIS — Z8543 Personal history of malignant neoplasm of ovary: Secondary | ICD-10-CM | POA: Diagnosis not present

## 2021-12-30 DIAGNOSIS — Z79899 Other long term (current) drug therapy: Secondary | ICD-10-CM | POA: Diagnosis not present

## 2021-12-30 DIAGNOSIS — Z20822 Contact with and (suspected) exposure to covid-19: Secondary | ICD-10-CM | POA: Diagnosis present

## 2021-12-30 DIAGNOSIS — J9622 Acute and chronic respiratory failure with hypercapnia: Secondary | ICD-10-CM

## 2021-12-30 DIAGNOSIS — I739 Peripheral vascular disease, unspecified: Secondary | ICD-10-CM | POA: Diagnosis present

## 2021-12-30 DIAGNOSIS — E7800 Pure hypercholesterolemia, unspecified: Secondary | ICD-10-CM | POA: Diagnosis present

## 2021-12-30 DIAGNOSIS — Z8249 Family history of ischemic heart disease and other diseases of the circulatory system: Secondary | ICD-10-CM

## 2021-12-30 DIAGNOSIS — Z7951 Long term (current) use of inhaled steroids: Secondary | ICD-10-CM

## 2021-12-30 DIAGNOSIS — Z825 Family history of asthma and other chronic lower respiratory diseases: Secondary | ICD-10-CM

## 2021-12-30 DIAGNOSIS — J441 Chronic obstructive pulmonary disease with (acute) exacerbation: Secondary | ICD-10-CM | POA: Diagnosis present

## 2021-12-30 DIAGNOSIS — Z9981 Dependence on supplemental oxygen: Secondary | ICD-10-CM

## 2021-12-30 DIAGNOSIS — J189 Pneumonia, unspecified organism: Principal | ICD-10-CM | POA: Diagnosis present

## 2021-12-30 DIAGNOSIS — I10 Essential (primary) hypertension: Secondary | ICD-10-CM | POA: Diagnosis present

## 2021-12-30 DIAGNOSIS — E782 Mixed hyperlipidemia: Secondary | ICD-10-CM | POA: Diagnosis present

## 2021-12-30 DIAGNOSIS — E785 Hyperlipidemia, unspecified: Secondary | ICD-10-CM | POA: Diagnosis present

## 2021-12-30 DIAGNOSIS — J9621 Acute and chronic respiratory failure with hypoxia: Secondary | ICD-10-CM | POA: Diagnosis present

## 2021-12-30 DIAGNOSIS — Z85028 Personal history of other malignant neoplasm of stomach: Secondary | ICD-10-CM | POA: Diagnosis not present

## 2021-12-30 DIAGNOSIS — Z87891 Personal history of nicotine dependence: Secondary | ICD-10-CM | POA: Diagnosis not present

## 2021-12-30 DIAGNOSIS — J449 Chronic obstructive pulmonary disease, unspecified: Secondary | ICD-10-CM | POA: Diagnosis present

## 2021-12-30 LAB — COMPREHENSIVE METABOLIC PANEL
ALT: 13 U/L (ref 0–44)
AST: 27 U/L (ref 15–41)
Albumin: 3.7 g/dL (ref 3.5–5.0)
Alkaline Phosphatase: 83 U/L (ref 38–126)
Anion gap: 7 (ref 5–15)
BUN: 9 mg/dL (ref 8–23)
CO2: 35 mmol/L — ABNORMAL HIGH (ref 22–32)
Calcium: 8.4 mg/dL — ABNORMAL LOW (ref 8.9–10.3)
Chloride: 96 mmol/L — ABNORMAL LOW (ref 98–111)
Creatinine, Ser: 0.57 mg/dL (ref 0.44–1.00)
GFR, Estimated: >60 mL/min (ref >60)
Glucose, Bld: 124 mg/dL — ABNORMAL HIGH (ref 70–99)
Potassium: 3.6 mmol/L (ref 3.5–5.1)
Sodium: 138 mmol/L (ref 135–145)
Total Bilirubin: 0.4 mg/dL (ref 0.3–1.2)
Total Protein: 6.7 g/dL (ref 6.5–8.1)

## 2021-12-30 LAB — CBC WITH DIFFERENTIAL/PLATELET
Abs Immature Granulocytes: 0.04 10*3/uL (ref 0.00–0.07)
Basophils Absolute: 0.1 10*3/uL (ref 0.0–0.1)
Basophils Relative: 1 %
Eosinophils Absolute: 0.4 10*3/uL (ref 0.0–0.5)
Eosinophils Relative: 5 %
HCT: 31.4 % — ABNORMAL LOW (ref 36.0–46.0)
Hemoglobin: 9.9 g/dL — ABNORMAL LOW (ref 12.0–15.0)
Immature Granulocytes: 1 %
Lymphocytes Relative: 34 %
Lymphs Abs: 2.5 10*3/uL (ref 0.7–4.0)
MCH: 32.7 pg (ref 26.0–34.0)
MCHC: 31.5 g/dL (ref 30.0–36.0)
MCV: 103.6 fL — ABNORMAL HIGH (ref 80.0–100.0)
Monocytes Absolute: 0.7 10*3/uL (ref 0.1–1.0)
Monocytes Relative: 9 %
Neutro Abs: 3.7 10*3/uL (ref 1.7–7.7)
Neutrophils Relative %: 50 %
Platelets: 115 10*3/uL — ABNORMAL LOW (ref 150–400)
RBC: 3.03 MIL/uL — ABNORMAL LOW (ref 3.87–5.11)
RDW: 14.9 % (ref 11.5–15.5)
WBC: 7.4 10*3/uL (ref 4.0–10.5)
nRBC: 0 % (ref 0.0–0.2)

## 2021-12-30 LAB — BLOOD GAS, ARTERIAL
Acid-Base Excess: 5.9 mmol/L — ABNORMAL HIGH (ref 0.0–2.0)
Bicarbonate: 28.8 mmol/L — ABNORMAL HIGH (ref 20.0–28.0)
Drawn by: 27016
FIO2: 50
O2 Saturation: 98.2 %
Patient temperature: 37
pCO2 arterial: 66.5 mmHg (ref 32.0–48.0)
pH, Arterial: 7.303 — ABNORMAL LOW (ref 7.350–7.450)
pO2, Arterial: 127 mmHg — ABNORMAL HIGH (ref 83.0–108.0)

## 2021-12-30 LAB — BLOOD GAS, VENOUS
Acid-Base Excess: 9.7 mmol/L — ABNORMAL HIGH (ref 0.0–2.0)
Bicarbonate: 31.4 mmol/L — ABNORMAL HIGH (ref 20.0–28.0)
Drawn by: 62567
FIO2: 32
O2 Saturation: 78 %
Patient temperature: 37.1
pCO2, Ven: 84 mmHg (ref 44.0–60.0)
pH, Ven: 7.263 (ref 7.250–7.430)
pO2, Ven: 53.9 mmHg — ABNORMAL HIGH (ref 32.0–45.0)

## 2021-12-30 LAB — TROPONIN I (HIGH SENSITIVITY)
Troponin I (High Sensitivity): 6 ng/L (ref <18)
Troponin I (High Sensitivity): 7 ng/L (ref <18)

## 2021-12-30 LAB — RESP PANEL BY RT-PCR (FLU A&B, COVID) ARPGX2
Influenza A by PCR: NEGATIVE
Influenza B by PCR: NEGATIVE
SARS Coronavirus 2 by RT PCR: NEGATIVE

## 2021-12-30 LAB — BRAIN NATRIURETIC PEPTIDE: B Natriuretic Peptide: 23 pg/mL (ref 0.0–100.0)

## 2021-12-30 LAB — LACTIC ACID, PLASMA: Lactic Acid, Venous: 0.7 mmol/L (ref 0.5–1.9)

## 2021-12-30 MED ORDER — IPRATROPIUM BROMIDE 0.02 % IN SOLN: 0.5000 mg | RESPIRATORY_TRACT | Status: AC

## 2021-12-30 MED ORDER — SODIUM CHLORIDE 0.9 % IV SOLN: 500.0000 mg | INTRAVENOUS | Status: DC

## 2021-12-30 MED ORDER — AMLODIPINE BESYLATE 5 MG PO TABS
5.0000 mg | ORAL_TABLET | Freq: Every day | ORAL | Status: DC
  Administered 2021-12-30 – 2021-12-31 (×2): 5 mg via ORAL
  Filled 2021-12-30 (×2): qty 1

## 2021-12-30 MED ORDER — UMECLIDINIUM BROMIDE 62.5 MCG/ACT IN AEPB
1.0000 | INHALATION_SPRAY | Freq: Every day | RESPIRATORY_TRACT | Status: DC
  Administered 2021-12-31 – 2022-01-01 (×2): 1 via RESPIRATORY_TRACT
  Filled 2021-12-30: qty 7

## 2021-12-30 MED ORDER — SODIUM CHLORIDE 0.9 % IV SOLN
1.0000 g | INTRAVENOUS | Status: DC
  Administered 2021-12-30 – 2021-12-31 (×2): 1 g via INTRAVENOUS
  Filled 2021-12-30 (×2): qty 10

## 2021-12-30 MED ORDER — FLUTICASONE FUROATE-VILANTEROL 100-25 MCG/ACT IN AEPB
1.0000 | INHALATION_SPRAY | Freq: Every day | RESPIRATORY_TRACT | Status: DC
  Administered 2021-12-31 – 2022-01-01 (×2): 1 via RESPIRATORY_TRACT
  Filled 2021-12-30: qty 28

## 2021-12-30 MED ORDER — ALBUTEROL SULFATE (2.5 MG/3ML) 0.083% IN NEBU: 10.0000 mg | INHALATION_SOLUTION | RESPIRATORY_TRACT | Status: AC

## 2021-12-30 MED ORDER — IPRATROPIUM BROMIDE 0.02 % IN SOLN
RESPIRATORY_TRACT | Status: AC
  Administered 2021-12-30: 0.5 mg
  Filled 2021-12-30: qty 2.5

## 2021-12-30 MED ORDER — METHYLPREDNISOLONE SODIUM SUCC 125 MG IJ SOLR
60.0000 mg | Freq: Every day | INTRAMUSCULAR | Status: DC
  Administered 2021-12-30 – 2022-01-01 (×3): 60 mg via INTRAVENOUS
  Filled 2021-12-30 (×3): qty 2

## 2021-12-30 MED ORDER — SODIUM CHLORIDE 0.9% FLUSH
3.0000 mL | Freq: Two times a day (BID) | INTRAVENOUS | Status: DC
  Administered 2021-12-30 (×2): 3 mL via INTRAVENOUS

## 2021-12-30 MED ORDER — IPRATROPIUM BROMIDE 0.02 % IN SOLN
RESPIRATORY_TRACT | Status: AC
  Administered 2021-12-30: 0.5 mg via RESPIRATORY_TRACT
  Filled 2021-12-30: qty 2.5

## 2021-12-30 MED ORDER — ALBUTEROL SULFATE (2.5 MG/3ML) 0.083% IN NEBU
INHALATION_SOLUTION | RESPIRATORY_TRACT | Status: AC
  Administered 2021-12-30: 2.5 mg
  Filled 2021-12-30: qty 12

## 2021-12-30 MED ORDER — ATORVASTATIN CALCIUM 40 MG PO TABS
40.0000 mg | ORAL_TABLET | Freq: Every day | ORAL | Status: DC
  Administered 2021-12-30 – 2022-01-01 (×3): 40 mg via ORAL
  Filled 2021-12-30 (×3): qty 1

## 2021-12-30 MED ORDER — ASPIRIN EC 81 MG PO TBEC
81.0000 mg | DELAYED_RELEASE_TABLET | Freq: Every day | ORAL | Status: DC
  Administered 2021-12-30 – 2022-01-01 (×3): 81 mg via ORAL
  Filled 2021-12-30 (×3): qty 1

## 2021-12-30 MED ORDER — SODIUM CHLORIDE 0.9 % IV SOLN
1.0000 g | Freq: Once | INTRAVENOUS | Status: AC
  Administered 2021-12-30: 1 g via INTRAVENOUS
  Filled 2021-12-30: qty 10

## 2021-12-30 MED ORDER — GABAPENTIN 600 MG PO TABS
600.0000 mg | ORAL_TABLET | Freq: Two times a day (BID) | ORAL | Status: DC
  Filled 2021-12-30 (×3): qty 1

## 2021-12-30 MED ORDER — ALBUTEROL SULFATE (2.5 MG/3ML) 0.083% IN NEBU
2.5000 mg | INHALATION_SOLUTION | Freq: Four times a day (QID) | RESPIRATORY_TRACT | Status: DC | PRN
  Administered 2021-12-30: 2.5 mg via RESPIRATORY_TRACT
  Filled 2021-12-30: qty 3

## 2021-12-30 MED ORDER — NORTRIPTYLINE HCL 25 MG PO CAPS
25.0000 mg | ORAL_CAPSULE | Freq: Every day | ORAL | Status: DC
  Administered 2021-12-30 – 2021-12-31 (×2): 25 mg via ORAL
  Filled 2021-12-30 (×2): qty 1

## 2021-12-30 MED ORDER — GABAPENTIN 300 MG PO CAPS: 600.0000 mg | ORAL_CAPSULE | Freq: Two times a day (BID) | ORAL | Status: DC

## 2021-12-30 MED ORDER — SODIUM CHLORIDE 0.9 % IV SOLN
500.0000 mg | Freq: Once | INTRAVENOUS | Status: AC
  Administered 2021-12-30: 500 mg via INTRAVENOUS
  Filled 2021-12-30: qty 5

## 2021-12-30 MED ORDER — SODIUM CHLORIDE 0.9 % IV SOLN
500.0000 mg | INTRAVENOUS | Status: DC
  Administered 2021-12-31 – 2022-01-01 (×2): 500 mg via INTRAVENOUS
  Filled 2021-12-30 (×2): qty 5

## 2021-12-30 MED ORDER — ENOXAPARIN SODIUM 40 MG/0.4ML IJ SOSY
40.0000 mg | PREFILLED_SYRINGE | INTRAMUSCULAR | Status: DC
  Administered 2021-12-30 – 2021-12-31 (×2): 40 mg via SUBCUTANEOUS
  Filled 2021-12-30 (×2): qty 0.4

## 2021-12-30 NOTE — ED Triage Notes (Signed)
Pt arrived from home via [facility] w c/o SOB. Pt states that she has been sick for a few days with cough and SOB

## 2021-12-30 NOTE — ED Provider Notes (Addendum)
[facility] EMERGENCY DEPARTMENT Provider Note   CSN: [id number] Arrival date & time: 12/30/21  3382     History  Chief Complaint  Patient presents with   Shortness of Breath    [name] is a 66 y.o. female.  Patient brought in by EMS in respiratory distress.  Patient arrived on CPAP.  Patient normally uses 3 L of oxygen at home.  EMS did give her Solu-Medrol as well as 9 nebulizer treatment.  Patient has been sick for a few days with cough and shortness of breath.  Past medical history significant for COPD hypertension high cholesterol ovarian cancer in remission peripheral vascular disease and oxygen dependent.  Patient was a former tobacco user quit smoking in 2014.  Smoked 2 packs/day for 30 years.      Home Medications Prior to Admission medications   Medication Sig Start Date End Date Taking? Authorizing Provider  albuterol (PROVENTIL HFA;VENTOLIN HFA) 108 (90 BASE) MCG/ACT inhaler Inhale 4-6 puffs into the lungs every 6 (six) hours as needed for wheezing or shortness of breath. Patient not taking: Reported on 12/10/2021    [provider]  amLODipine (NORVASC) 5 MG tablet Take 5 mg by mouth at bedtime.     [provider]  aspirin EC 81 MG tablet Take 81 mg by mouth daily.    [provider]  CARBOPLATIN IV Inject into the vein every 21 ( twenty-one) days. 07/05/20   [provider]  doxycycline (VIBRA-TABS) 100 MG tablet Take 100 mg by mouth 2 (two) times daily. 11/02/21   [provider]  ferrous sulfate 325 (65 FE) MG tablet Take 325 mg by mouth every other day.    [provider]  Fluticasone-Umeclidin-Vilant (TRELEGY ELLIPTA) 100-62.5-25 MCG/ACT AEPB Inhale into the lungs. 06/18/21   [provider]  gabapentin (NEURONTIN) 600 MG tablet Take 600 mg by mouth 2 (two) times daily. 02/02/21   [provider]  ipratropium (ATROVENT HFA) 17 MCG/ACT inhaler Inhale 2 puffs into the lungs every 6 (six)  hours as needed for wheezing. Patient not taking: Reported on 12/10/2021    [provider]  ipratropium-albuterol (DUONEB) 0.5-2.5 (3) MG/3ML SOLN Take 0.5 mg by nebulization every 6 (six) hours as needed (COPD). Patient not taking: Reported on 12/10/2021 08/03/12   [provider]  levalbuterol Penne Lash) 1.25 MG/3ML nebulizer solution Inhale into the lungs. 09/28/21   [provider]  magnesium oxide (MAG-OX) 400 (240 Mg) MG tablet Take 1 tablet (400 mg total) by mouth 2 (two) times daily. 12/25/21   Derek Jack, MD  niraparib tosylate (ZEJULA) 100 MG capsule Take 2 capsules (200 mg total) by mouth daily. May take at bedtime to reduce nausea and vomiting. 05/23/21   Heath Lark, MD  nortriptyline (PAMELOR) 25 MG capsule Take 25 mg by mouth at bedtime.    [provider]  ondansetron (ZOFRAN ODT) 4 MG disintegrating tablet 4mg  ODT q4 hours prn nausea/vomit 07/08/20   Milton Ferguson, MD  ondansetron (ZOFRAN) 8 MG tablet Take 1 tablet (8 mg total) by mouth every 8 (eight) hours as needed for nausea. Patient not taking: Reported on 12/10/2021 05/25/21   Heath Lark, MD  OXYGEN Inhale 3 L into the lungs continuous.     [provider]  PACLITAXEL IV Inject into the vein every 21 ( twenty-one) days. 07/05/20   [provider]  pantoprazole (PROTONIX) 40 MG tablet Take 1 tablet (40 mg total) by mouth daily. 01/28/18   Gladstone Lighter,  MD  predniSONE (DELTASONE) 20 MG tablet Take 40 mg by mouth daily. 11/02/21   [provider]  simvastatin (ZOCOR) 40 MG tablet Take 40 mg by mouth at bedtime.    [provider]      Allergies    Patient has no known allergies.    Review of Systems   Review of Systems  Constitutional:  Negative for chills and fever.  HENT:  Negative for ear pain and sore throat.   Eyes:  Negative for pain and visual disturbance.  Respiratory:  Positive for cough and shortness of breath.   Cardiovascular:   Negative for chest pain and palpitations.  Gastrointestinal:  Negative for abdominal pain and vomiting.  Genitourinary:  Negative for dysuria and hematuria.  Musculoskeletal:  Negative for arthralgias and back pain.  Skin:  Negative for color change and rash.  Neurological:  Negative for seizures and syncope.  All other systems reviewed and are negative.  Physical Exam Updated Vital Signs BP (!) 157/77    Pulse (!) 112    Temp 98.7 F (37.1 C) (Oral)    Resp (!) 27    Ht 1.524 m (5')    Wt 63 kg    SpO2 93%    BMI 27.15 kg/m  Physical Exam Vitals and nursing note reviewed.  Constitutional:      General: She is in acute distress.     Appearance: Normal appearance. She is well-developed.  HENT:     Head: Normocephalic and atraumatic.  Eyes:     Extraocular Movements: Extraocular movements intact.     Conjunctiva/sclera: Conjunctivae normal.     Pupils: Pupils are equal, round, and reactive to light.  Cardiovascular:     Rate and Rhythm: Normal rate and regular rhythm.     Heart sounds: No murmur heard. Pulmonary:     Effort: Respiratory distress present.     Breath sounds: No wheezing, rhonchi or rales.     Comments: Decreased breath sounds bilateral. Abdominal:     Palpations: Abdomen is soft.     Tenderness: There is no abdominal tenderness.  Musculoskeletal:        General: No swelling.     Cervical back: Normal range of motion and neck supple.  Skin:    General: Skin is warm and dry.     Capillary Refill: Capillary refill takes less than 2 seconds.  Neurological:     General: No focal deficit present.     Mental Status: She is alert and oriented to person, place, and time.  Psychiatric:        Mood and Affect: Mood normal.    ED Results / Procedures / Treatments   Labs (all labs ordered are listed, but only abnormal results are displayed) Labs Reviewed  CBC WITH DIFFERENTIAL/PLATELET - Abnormal; Notable for the following components:      Result Value   RBC 3.03  (*)    Hemoglobin 9.9 (*)    HCT 31.4 (*)    MCV 103.6 (*)    Platelets 115 (*)    All other components within normal limits  BLOOD GAS, VENOUS - Abnormal; Notable for the following components:   pCO2, Ven 84.0 (*)    pO2, Ven 53.9 (*)    Bicarbonate 31.4 (*)    Acid-Base Excess 9.7 (*)    All other components within normal limits  CULTURE, BLOOD (ROUTINE X 2)  CULTURE, BLOOD (ROUTINE X 2)  RESP PANEL BY RT-PCR (FLU A&B, COVID) ARPGX2  LACTIC ACID, PLASMA  COMPREHENSIVE METABOLIC PANEL  BRAIN NATRIURETIC PEPTIDE  TROPONIN I (HIGH SENSITIVITY)    EKG EKG Interpretation  Date/Time:  Sunday December 30 2021 06:56:31 EST Ventricular Rate:  113 PR Interval:  171 QRS Duration: 100 QT Interval:  308 QTC Calculation: 419 R Axis:   102 Text Interpretation: Sinus tachycardia Atrial premature complex Probable right ventricular hypertrophy Interpretation limited secondary to artifact >>> Acute MI <<< Confirmed by Fredia Sorrow [phone]) on 12/30/2021 7:16:14 AM  Radiology DG Chest Port 1 View  Result Date: 12/30/2021 CLINICAL DATA:  Shortness of breath and productive cough for a few days EXAM: PORTABLE CHEST 1 VIEW COMPARISON:  11/02/2021 radiograph and chest CT 10/30/2021 FINDINGS: Chronic generalized interstitial coarsening. Accentuated markings with reticulonodular opacity at the left base where there is likely bronchopneumonia. Large lung volumes with diaphragm flattening. Left porta catheter with tip at the SVC. Normal heart size and mediastinal contours. IMPRESSION: 1. Pneumonia the left lung base. 2. Chronic lung disease/COPD. Electronically Signed   By: Jorje Guild M.D.   On: 12/30/2021 07:17    Procedures Procedures    Medications Ordered in ED Medications  cefTRIAXone (ROCEPHIN) 1 g in sodium chloride 0.9 % 100 mL IVPB (has no administration in time range)  azithromycin (ZITHROMAX) 500 mg in sodium chloride 0.9 % 250 mL IVPB (has no administration in time range)   albuterol (PROVENTIL) (2.5 MG/3ML) 0.083% nebulizer solution 10 mg (2.5 mg Nebulization Given 12/30/21 0703)  ipratropium (ATROVENT) nebulizer solution 0.5 mg (0.5 mg Nebulization Given 12/30/21 6720)    ED Course/ Medical Decision Making/ A&P                           Medical Decision Making Amount and/or Complexity of Data Reviewed Labs: ordered.  Risk Decision regarding hospitalization.   CRITICAL CARE Performed by: Fredia Sorrow Total critical care time: 60 minutes Critical care time was exclusive of separately billable procedures and treating other patients. Critical care was necessary to treat or prevent imminent or life-threatening deterioration. Critical care was time spent personally by me on the following activities: development of treatment plan with patient and/or surrogate as well as nursing, discussions with consultants, evaluation of patient's response to treatment, examination of patient, obtaining history from patient or surrogate, ordering and performing treatments and interventions, ordering and review of laboratory studies, ordering and review of radiographic studies, pulse oximetry and re-evaluation of patient's condition.   Patient arrived on CPAP.  He was off briefly just on regular oxygen was working hard to breathe.  In discussion with respiratory therapy we put her on our BiPAP.  Venous blood gas shows a significant retention of CO2.  CBC without any leukocytosis hemoglobin down a little bit at 9.9.  Complete metabolic panel without significant abnormalities renal function normal.  Initial troponin 6.  BNP good at 23.  Lactic acid good at 0.7.  EKG raise some concerns about inferior MI.  But there was marked baseline wandering.  Not consistent with an acute STEMI.  Chest x-ray shows pneumonia left lung base.  Probably the cause of the worsening of her COPD and breathing.  Patient will require admission.  We will just monitor for period of time to make sure  that the carbon oxide comes down.  Patient is mentating fine.  Not showing any mental abnormalities with the elevated CO2.  Patient probably a CO2 retainer and somewhat adapted.  Delta troponins without any significant change.  Second  troponin 7.  COVID influenza negative.  Respiratory has sent off arterial blood gas to get a baseline for her PCO2.  Patient remains very alert.  Even in the face of the initial venous blood gas.  Patient should be stable for admission on BiPAP.  Observation during his period time there is been no worsening patient feels better on the BiPAP.  Patient's arterial blood gas with a pH 7.3 sweats reassuring CO2 of 66.5.  Patient still very alert.  Will contact hospitalist for admission.   Final Clinical Impression(s) / ED Diagnoses Final diagnoses:  COPD exacerbation ([place])  Acute on chronic respiratory failure with hypoxia ([place])  Community acquired pneumonia of left lower lobe of lung    Rx / DC Orders ED Discharge Orders     None         Fredia Sorrow, MD 12/30/21 1975    Fredia Sorrow, MD 12/30/21 0930    Fredia Sorrow, MD 12/30/21 1001

## 2021-12-30 NOTE — Assessment & Plan Note (Addendum)
Prednisone taper, breathing treatments

## 2021-12-30 NOTE — H&P (Signed)
History and Physical    Patient: [name] STM:[id number] DOB: 1956-05-31 DOA: 12/30/2021 DOS: the patient was seen and examined on 12/30/2021 PCP: [name], MD  Patient coming from: Home  Chief Complaint:  Chief Complaint  Patient presents with   Shortness of Breath    HPI: [name] is a 66 y.o. female with medical history significant of COPD, chronic hypoxic respiratory failure on 3L O2 at baseline, HLD, HTN, who presents with chief complaint of shortness of breath.  She admits to shortness of breath as well as productive cough of white phlegm.  Denies any fevers or chills, denies any chest pain, nausea, vomiting or abdominal pain.  She is a former smoker and quit in about 2013.  Review of Systems: As mentioned in the history of present illness. All other systems reviewed and are negative. Past Medical History:  Diagnosis Date   Anemia    Aortic atherosclerosis (HCC)    Arthritis    Asthma    Bilateral carotid artery stenosis    Cancer (HCC)    Gastric Cancer   COPD (chronic obstructive pulmonary disease) (HCC)    High cholesterol    Hyperlipidemia    Hypertension    Neuropathy    Osteoarthritis    Ovarian cancer ([place])    Oxygen dependent    Peripheral vascular disease ([place])    Peritoneal carcinoma ([place])    Pneumonia 2019   Port-A-Cath in place 07/03/2020   Pulmonary emphysema ([place])    Past Surgical History:  Procedure Laterality Date   ESOPHAGOGASTRODUODENOSCOPY (EGD) WITH PROPOFOL N/A 02/17/2021   Procedure: ESOPHAGOGASTRODUODENOSCOPY (EGD) WITH PROPOFOL;  Surgeon: Harvel Quale, MD;  Location: [facility];  Service: Gastroenterology;  Laterality: N/A;   HALLUX VALGUS BASE WEDGE Right 06/09/2015   Procedure: Base wedge osteotomy with modified McBride right foot ;  Surgeon: Sharlotte Alamo, MD;  Location: [facility] ORS;  Service: Podiatry;  Laterality: Right;   PORTACATH PLACEMENT Left 06/28/2020   Procedure: PORT-A-CATHETER PLACEMENT LEFT CHEST  (attached catheter in left subclavian);  Surgeon: Virl Cagey, MD;  Location: [facility];  Service: General;  Laterality: Left;   TUBAL LIGATION     VIDEO BRONCHOSCOPY WITH ENDOBRONCHIAL NAVIGATION N/A 03/09/2021   Procedure: VIDEO BRONCHOSCOPY WITH ENDOBRONCHIAL NAVIGATION;  Surgeon: Ottie Glazier, MD;  Location: [facility] ORS;  Service: Thoracic;  Laterality: N/A;   VIDEO BRONCHOSCOPY WITH ENDOBRONCHIAL ULTRASOUND N/A 03/09/2021   Procedure: VIDEO BRONCHOSCOPY WITH ENDOBRONCHIAL ULTRASOUND;  Surgeon: Ottie Glazier, MD;  Location: [facility] ORS;  Service: Thoracic;  Laterality: N/A;   Social History:  reports that she quit smoking about 8 years ago. Her smoking use included cigarettes. She has a 60.00 pack-year smoking history. She has never used smokeless tobacco. She reports that she does not drink alcohol and does not use drugs.  No Known Allergies  Family History  Problem Relation Age of Onset   Alzheimer's disease Mother    COPD Father    Emphysema Father    Hypertension Father    Healthy Sister    Healthy Brother    Alzheimer's disease Maternal Grandmother    Healthy Sister    Healthy Sister    Healthy Sister    Prostate cancer Other        paternal grandmother's brother; dx in early 34s   Breast cancer Neg Hx     Prior to Admission medications   Medication Sig Start Date End Date Taking? Authorizing Provider  albuterol (PROVENTIL HFA;VENTOLIN HFA) 108 (90  BASE) MCG/ACT inhaler Inhale 4-6 puffs into the lungs every 6 (six) hours as needed for wheezing or shortness of breath. Patient not taking: Reported on 12/10/2021    [provider]  amLODipine (NORVASC) 5 MG tablet Take 5 mg by mouth at bedtime.     [provider]  aspirin EC 81 MG tablet Take 81 mg by mouth daily.    [provider]  CARBOPLATIN IV Inject into the vein every 21 ( twenty-one) days. 07/05/20   [provider]  doxycycline (VIBRA-TABS) 100 MG tablet Take 100 mg by mouth 2  (two) times daily. 11/02/21   [provider]  ferrous sulfate 325 (65 FE) MG tablet Take 325 mg by mouth every other day.    [provider]  Fluticasone-Umeclidin-Vilant (TRELEGY ELLIPTA) 100-62.5-25 MCG/ACT AEPB Inhale into the lungs. 06/18/21   [provider]  gabapentin (NEURONTIN) 600 MG tablet Take 600 mg by mouth 2 (two) times daily. 02/02/21   [provider]  ipratropium (ATROVENT HFA) 17 MCG/ACT inhaler Inhale 2 puffs into the lungs every 6 (six) hours as needed for wheezing. Patient not taking: Reported on 12/10/2021    [provider]  ipratropium-albuterol (DUONEB) 0.5-2.5 (3) MG/3ML SOLN Take 0.5 mg by nebulization every 6 (six) hours as needed (COPD). Patient not taking: Reported on 12/10/2021 08/03/12   [provider]  levalbuterol Penne Lash) 1.25 MG/3ML nebulizer solution Inhale into the lungs. 09/28/21   [provider]  magnesium oxide (MAG-OX) 400 (240 Mg) MG tablet Take 1 tablet (400 mg total) by mouth 2 (two) times daily. 12/25/21   Derek Jack, MD  niraparib tosylate (ZEJULA) 100 MG capsule Take 2 capsules (200 mg total) by mouth daily. May take at bedtime to reduce nausea and vomiting. 05/23/21   Heath Lark, MD  nortriptyline (PAMELOR) 25 MG capsule Take 25 mg by mouth at bedtime.    [provider]  ondansetron (ZOFRAN ODT) 4 MG disintegrating tablet 4mg  ODT q4 hours prn nausea/vomit 07/08/20   Milton Ferguson, MD  ondansetron (ZOFRAN) 8 MG tablet Take 1 tablet (8 mg total) by mouth every 8 (eight) hours as needed for nausea. Patient not taking: Reported on 12/10/2021 05/25/21   Heath Lark, MD  OXYGEN Inhale 3 L into the lungs continuous.     [provider]  PACLITAXEL IV Inject into the vein every 21 ( twenty-one) days. 07/05/20   [provider]  pantoprazole (PROTONIX) 40 MG tablet Take 1 tablet (40 mg total) by mouth daily. 01/28/18   Gladstone Lighter, MD  predniSONE (DELTASONE) 20  MG tablet Take 40 mg by mouth daily. 11/02/21   [provider]  simvastatin (ZOCOR) 40 MG tablet Take 40 mg by mouth at bedtime.    [provider]    Physical Exam: Vitals:   12/30/21 0825 12/30/21 0830 12/30/21 0845 12/30/21 0930  BP:  (!) 164/145  (!) 103/59  Pulse: (!) 115 (!) 116 (!) 115 (!) 105  Resp: (!) 24 (!) 25 (!) 25 (!) 23  Temp:      TempSrc:      SpO2: 100% 100% 100% 100%  Weight:      Height:       Examination: General exam: Appears calm and comfortable, without acute distress Respiratory system: Remains on BiPAP.  Diminished breath sounds in upper lobes with minimal wheezing, crackles left lower base Cardiovascular system: S1 & S2 heard, tachycardic, regular rhythm. No pedal edema. Gastrointestinal system: Abdomen is nondistended, soft and  nontender. Normal bowel sounds heard. Central nervous system: Alert and oriented. Non focal exam. Speech clear  Extremities: Symmetric in appearance bilaterally  Skin: No rashes, lesions or ulcers on exposed skin  Psychiatry: Judgement and insight appear stable. Mood & affect appropriate.    Data Reviewed:  CO2 35, pH 7.30, PCO2 66.5, BNP 23, troponin 6, 7, lactic acid 0.7, influenza and COVID-negative, chest x-ray independently, showing left lower lobar infiltrate  Assessment and Plan: * Acute on chronic respiratory failure with hypoxia and hypercapnia (HCC)- (present on admission) Secondary to pneumonia and COPD exacerbation Requires 3 L nasal cannula O2 at baseline On BiPAP in the emergency department  CAP (community acquired pneumonia)- (present on admission) Rocephin, azithromycin  COPD with acute exacerbation ([place])- (present on admission) Solu-Medrol, breathing treatments  Hyperlipidemia- (present on admission) Switch zocor to lipitor due to drug-interaction with norvasc   Hypertension- (present on admission) Continue norvasc       Advance Care Planning: Full code, confirmed with patient  at time of admission  Consults: None  Family Communication: No family at bedside  Severity of Illness: The appropriate patient status for this patient is INPATIENT. Inpatient status is judged to be reasonable and necessary in order to provide the required intensity of service to ensure the patient's safety. The patient's presenting symptoms, physical exam findings, and initial radiographic and laboratory data in the context of their chronic comorbidities is felt to place them at high risk for further clinical deterioration. Furthermore, it is not anticipated that the patient will be medically stable for discharge from the hospital within 2 midnights of admission.   * I certify that at the point of admission it is my clinical judgment that the patient will require inpatient hospital care spanning beyond 2 midnights from the point of admission due to high intensity of service, high risk for further deterioration and high frequency of surveillance required.*  Author: Dessa Phi, DO 12/30/2021 12:04 PM  For on call review [email or web address].

## 2021-12-30 NOTE — Assessment & Plan Note (Signed)
Switch zocor to lipitor due to drug-interaction with norvasc

## 2021-12-30 NOTE — ED Notes (Signed)
Removed from bipap at this time and placed on 3lpm [place]. Current SPO2 is 96%

## 2021-12-30 NOTE — Assessment & Plan Note (Signed)
Continue norvasc 

## 2021-12-30 NOTE — Assessment & Plan Note (Addendum)
Rocephin, azithromycin --> Keflex, azithromycin for discharge

## 2021-12-30 NOTE — Assessment & Plan Note (Addendum)
Secondary to pneumonia and COPD exacerbation Requires 3 L nasal cannula O2 at baseline Required BiPAP on admission, now down to 3 L

## 2021-12-31 LAB — CBC
HCT: 28.5 % — ABNORMAL LOW (ref 36.0–46.0)
Hemoglobin: 8.7 g/dL — ABNORMAL LOW (ref 12.0–15.0)
MCH: 30.6 pg (ref 26.0–34.0)
MCHC: 30.5 g/dL (ref 30.0–36.0)
MCV: 100.4 fL — ABNORMAL HIGH (ref 80.0–100.0)
Platelets: 106 10*3/uL — ABNORMAL LOW (ref 150–400)
RBC: 2.84 MIL/uL — ABNORMAL LOW (ref 3.87–5.11)
RDW: 14.9 % (ref 11.5–15.5)
WBC: 6.7 10*3/uL (ref 4.0–10.5)
nRBC: 0 % (ref 0.0–0.2)

## 2021-12-31 LAB — BASIC METABOLIC PANEL
Anion gap: 11 (ref 5–15)
BUN: 11 mg/dL (ref 8–23)
CO2: 32 mmol/L (ref 22–32)
Calcium: 8.8 mg/dL — ABNORMAL LOW (ref 8.9–10.3)
Chloride: 95 mmol/L — ABNORMAL LOW (ref 98–111)
Creatinine, Ser: 0.54 mg/dL (ref 0.44–1.00)
GFR, Estimated: >60 mL/min (ref >60)
Glucose, Bld: 116 mg/dL — ABNORMAL HIGH (ref 70–99)
Potassium: 3.7 mmol/L (ref 3.5–5.1)
Sodium: 138 mmol/L (ref 135–145)

## 2021-12-31 LAB — STREP PNEUMONIAE URINARY ANTIGEN: Strep Pneumo Urinary Antigen: NEGATIVE

## 2021-12-31 MED ORDER — CALCIUM CARBONATE ANTACID 500 MG PO CHEW
1.0000 | CHEWABLE_TABLET | Freq: Two times a day (BID) | ORAL | Status: DC
  Administered 2021-12-31 – 2022-01-01 (×3): 200 mg via ORAL
  Filled 2021-12-31 (×3): qty 1

## 2021-12-31 NOTE — Progress Notes (Signed)
Patient is alert and awake , pulse still remains tachy at 113. Patient denies pain or discomfort. MEWS continued to monitor pulse rate.     12/31/21 1631  Assess: MEWS Score  Temp 97.8 F (36.6 C)  BP (!) 156/76  Pulse Rate (!) 113  SpO2 98 %  Assess: MEWS Score  MEWS Temp 0  MEWS Systolic 0  MEWS Pulse 2  MEWS RR 0  MEWS LOC 0  MEWS Score 2  MEWS Score Color Yellow

## 2021-12-31 NOTE — Progress Notes (Signed)
Breo and Incruse mdi's placed in room .

## 2021-12-31 NOTE — Progress Notes (Signed)
°  Progress Note   Patient: [name]:[id number] DOB: 11/17/1956 DOA: 12/30/2021     1 DOS: the patient was seen and examined on 12/31/2021   Brief hospital course: [name] is a 66 y.o. female with medical history significant of COPD, chronic hypoxic respiratory failure on 3L O2 at baseline, HLD, HTN, who presents with chief complaint of shortness of breath.  She admits to shortness of breath as well as productive cough of white phlegm.  Denies any fevers or chills, denies any chest pain, nausea, vomiting or abdominal pain.  She is a former smoker and quit in about 2013.  She was admitted for respiratory failure secondary to pneumonia and COPD exacerbation.  She required BiPAP on admission.  2/13: Patient was weaned off BiPAP yesterday afternoon.  Today she is on her baseline of 3 L oxygen and feeling well.  Encouraged patient to get up and walk around today.  Assessment and Plan: * Acute on chronic respiratory failure with hypoxia and hypercapnia (HCC)- (present on admission) Secondary to pneumonia and COPD exacerbation Requires 3 L nasal cannula O2 at baseline On BiPAP in the emergency department  CAP (community acquired pneumonia)- (present on admission) Rocephin, azithromycin  COPD with acute exacerbation ([place])- (present on admission) Solu-Medrol, breathing treatments  Hyperlipidemia- (present on admission) Switch zocor to lipitor due to drug-interaction with norvasc   Hypertension- (present on admission) Continue norvasc         Physical Exam: Vitals:   12/31/21 0300 12/31/21 0656 12/31/21 0814 12/31/21 1148  BP: (!) 156/68 105/68  131/66  Pulse: (!) 101 96  99  Resp: 20 16    Temp: 98 F (36.7 C) 97.9 F (36.6 C)  98.7 F (37.1 C)  TempSrc: Oral Oral    SpO2: 98% 97% 94% 92%  Weight:      Height:       Examination: General exam: Appears calm and comfortable  Respiratory system: Diminished breath sounds bilaterally, respiratory effort is normal  without conversational dyspnea on 3 L oxygen Cardiovascular system: S1 & S2 heard, RRR. No pedal edema. Gastrointestinal system: Abdomen is nondistended, soft and nontender. Normal bowel sounds heard. Central nervous system: Alert and oriented. Non focal exam. Speech clear  Extremities: Symmetric in appearance bilaterally  Skin: No rashes, lesions or ulcers on exposed skin  Psychiatry: Judgement and insight appear stable. Mood & affect appropriate.    Data Reviewed:  Patient with stable hemoglobin at 8.7, stable platelets 106, BMP unremarkable  Family Communication: No family at bedside  Disposition: Status is: Inpatient Remains inpatient appropriate because: Requiring IV antibiotics, hopeful discharge home 2/14          Planned Discharge Destination: Home     Author: Dessa Phi, DO 12/31/2021 12:14 PM  For on call review [email or web address].

## 2021-12-31 NOTE — Progress Notes (Signed)
Will recheck pule   12/31/21 1421  Assess: MEWS Score  Temp 97.9 F (36.6 C)  BP (!) 143/75  Pulse Rate (!) 116  SpO2 97 %  Assess: MEWS Score  MEWS Temp 0  MEWS Systolic 0  MEWS Pulse 2  MEWS RR 0  MEWS LOC 0  MEWS Score 2  MEWS Score Color Yellow  Assess: if the MEWS score is Yellow or Red  Were vital signs taken at a resting state? Yes  Focused Assessment No change from prior assessment  Early Detection of Sepsis Score *See Row Information* Low  MEWS guidelines implemented *See Row Information* No, vital signs rechecked

## 2021-12-31 NOTE — Progress Notes (Deleted)
Rechecked patients Pulse   12/31/21 1438  Assess: MEWS Score  Pulse Rate 78  Assess: MEWS Score  MEWS Temp 0  MEWS Systolic 0  MEWS Pulse 0  MEWS RR 0  MEWS LOC 0  MEWS Score 0  MEWS Score Color [name]

## 2021-12-31 NOTE — Hospital Course (Addendum)
[name] is a 66 y.o. female with medical history significant of COPD, chronic hypoxic respiratory failure on 3L O2 at baseline, HLD, HTN, who presents with chief complaint of shortness of breath.  She admits to shortness of breath as well as productive cough of white phlegm.  Denies any fevers or chills, denies any chest pain, nausea, vomiting or abdominal pain.  She is a former smoker and quit in about 2013.  She was admitted for respiratory failure secondary to pneumonia and COPD exacerbation.  She required BiPAP on admission.  Patient was quickly weaned off BiPAP in the emergency department and remained stable on her home 3 L of oxygen.  She continued to improve.  On day of discharge, patient was feeling well on her 3 L nasal cannula O2 without worsening shortness of breath.  She felt ready to discharge home.

## 2021-12-31 NOTE — Progress Notes (Signed)
°   12/31/21 1421  Charting Type  Charting Type Reassessment  Neurological  Neuro (WDL) WDL  Cardiac  Cardiac (WDL) X  Pulse Other (Comment) (Tachy at 116 asymptomatic)  ECG Monitoring  Cardiac Rhythm NSR  Vascular  Vascular (WDL) WDL

## 2021-12-31 NOTE — Progress Notes (Signed)
°   12/31/21 1810  Vitals  Temp 98.1 F (36.7 C)  Temp Source Oral  BP 138/85  MAP (mmHg) 100  BP Location Right Arm  BP Method Automatic  Patient Position (if appropriate) Lying  Pulse Rate (!) 117  Pulse Rate Source Monitor  MEWS COLOR  MEWS Score Color Yellow  Oxygen Therapy  SpO2 100 %  MEWS Score  MEWS Temp 0  MEWS Systolic 0  MEWS Pulse 2  MEWS RR 0  MEWS LOC 0  MEWS Score 2

## 2021-12-31 NOTE — TOC Progression Note (Signed)
°  Transition of Care [facility]) Screening Note   Patient Details  Name: [name] Date of Birth: 09/29/1956   Transition of Care [facility]) CM/SW Contact:    Shade Flood, LCSW Phone Number: 12/31/2021, 9:17 AM    Transition of Care Department [facility]) has reviewed patient and no TOC needs have been identified at this time. We will continue to monitor patient advancement through interdisciplinary progression rounds. If new patient transition needs arise, please place a TOC consult.

## 2021-12-31 NOTE — Progress Notes (Signed)
Notified Dr. Maylene Roes , awaiting new orders    12/31/21 1421  Assess: MEWS Score  Temp 97.9 F (36.6 C)  BP (!) 143/75  Pulse Rate (!) 116  SpO2 97 %  Assess: MEWS Score  MEWS Temp 0  MEWS Systolic 0  MEWS Pulse 2  MEWS RR 0  MEWS LOC 0  MEWS Score 2  MEWS Score Color Yellow  Assess: if the MEWS score is Yellow or Red  Were vital signs taken at a resting state? Yes  Focused Assessment No change from prior assessment  Early Detection of Sepsis Score *See Row Information* Low  MEWS guidelines implemented *See Row Information* No, vital signs rechecked  Treat  MEWS Interventions Other (Comment) (NOtified Dr. Maylene Roes)  Pain Scale 0-10  Pain Score 0  Take Vital Signs  Increase Vital Sign Frequency  Yellow: Q 2hr X 2 then Q 4hr X 2, if remains yellow, continue Q 4hrs  Escalate  MEWS: Escalate Yellow: discuss with charge nurse/RN and consider discussing with provider and RRT  Notify: Charge Nurse/RN  Name of Charge Nurse/RN Notified Audrea Muscat  Date Charge Nurse/RN Notified 12/31/21  Time Charge Nurse/RN Notified 1448  Notify: Provider  Provider Name/Title Dr. Maylene Roes  Date Provider Notified 12/31/21  Time Provider Notified 1448  Notification Type Page  Notification Reason Other (Comment)  Document  Patient Outcome Other (Comment) (awaiting new orders)  Progress note created (see row info) Yes

## 2022-01-01 LAB — LEGIONELLA PNEUMOPHILA SEROGP 1 UR AG: L. pneumophila Serogp 1 Ur Ag: NEGATIVE

## 2022-01-01 MED ORDER — CEPHALEXIN 500 MG PO CAPS: 500.0000 mg | ORAL_CAPSULE | Freq: Four times a day (QID) | ORAL | 0 refills | Status: AC

## 2022-01-01 MED ORDER — PREDNISONE 10 MG PO TABS: ORAL_TABLET | ORAL | 0 refills | Status: DC

## 2022-01-01 MED ORDER — ATORVASTATIN CALCIUM 40 MG PO TABS: 40.0000 mg | ORAL_TABLET | Freq: Every day | ORAL | 1 refills | Status: DC

## 2022-01-01 MED ORDER — AZITHROMYCIN 500 MG PO TABS: 500.0000 mg | ORAL_TABLET | Freq: Every day | ORAL | 0 refills | Status: AC

## 2022-01-01 NOTE — Evaluation (Signed)
Physical Therapy Evaluation Patient Details Name: [name] MRN: [id number] DOB: Jan 01, 1956 Today's Date: 01/01/2022  History of Present Illness  [name] is a 66 y.o. female with medical history significant of COPD, chronic hypoxic respiratory failure on 3L O2 at baseline, HLD, HTN, who presents with chief complaint of shortness of breath.  She admits to shortness of breath as well as productive cough of white phlegm.  Denies any fevers or chills, denies any chest pain, nausea, vomiting or abdominal pain.  She is a former smoker and quit in about 2013.    Clinical Impression  Patient presents near baseline on PT eval.  She is on 3L of O2 and this is her baseline.  Patient is mod indepent with bed mobility, STS, bed to chair transfer.  Patient ambulates in the room mod indep with O2 and able to use the bathroom mod independent today.  Plan:  Patient discharged from physical therapy to care of nursing for ambulation daily as tolerated for length of stay.        Recommendations for follow up therapy are one component of a multi-disciplinary discharge planning process, led by the attending physician.  Recommendations may be updated based on patient status, additional functional criteria and insurance authorization.  Follow Up Recommendations No PT follow up    Assistance Recommended at Discharge PRN  Patient can return home with the following  Assistance with cooking/housework    Equipment Recommendations    Recommendations for Other Services       Functional Status Assessment Patient has not had a recent decline in their functional status     Precautions / Restrictions Precautions Precautions: Fall Restrictions Weight Bearing Restrictions: No      Mobility  Bed Mobility Overal bed mobility: Modified Independent                  Transfers Overall transfer level: Modified independent Equipment used: None                     Ambulation/Gait Ambulation/Gait assistance: Modified independent (Device/Increase time) Gait Distance (Feet): 75 Feet Assistive device: None         General Gait Details: decreased gait speed overall  Stairs            Wheelchair Mobility    Modified Rankin (Stroke Patients Only)       Balance Overall balance assessment: Modified Independent                                           Pertinent Vitals/Pain Pain Assessment Pain Assessment: No/denies pain    Home Living Family/patient expects to be discharged to:: Private residence Living Arrangements: Children Available Help at Discharge: Family;Available 24 hours/day Type of Home: Apartment Home Access: Level entry       Home Layout: One level Home Equipment: Rollator (4 wheels);Shower seat      Prior Function Prior Level of Function : Needs assist             Mobility Comments: daughter assists some with shopping ADLs Comments: patient mod indep in apartment     Hand Dominance        Extremity/Trunk Assessment   Upper Extremity Assessment Upper Extremity Assessment: Generalized weakness    Lower Extremity Assessment Lower Extremity Assessment: Generalized weakness    Cervical / Trunk Assessment Cervical / Trunk Assessment:  Normal  Communication   Communication: No difficulties  Cognition Arousal/Alertness: Awake/alert Behavior During Therapy: WFL for tasks assessed/performed Overall Cognitive Status: Within Functional Limits for tasks assessed                                 General Comments: patient answers all questions appropriately        General Comments      Exercises     Assessment/Plan    PT Assessment Patient does not need any further PT services  PT Problem List         PT Treatment Interventions      PT Goals (Current goals can be found in the Care Plan section)  Acute Rehab PT Goals Patient Stated Goal: return home PT  Goal Formulation: With patient Time For Goal Achievement: 01/04/22 Potential to Achieve Goals: Good    Frequency       Co-evaluation               AM-PAC PT "6 Clicks" Mobility  Outcome Measure Help needed turning from your back to your side while in a flat bed without using bedrails?: None Help needed moving from lying on your back to sitting on the side of a flat bed without using bedrails?: None Help needed moving to and from a bed to a chair (including a wheelchair)?: None Help needed standing up from a chair using your arms (e.g., wheelchair or bedside chair)?: None Help needed to walk in hospital room?: None Help needed climbing 3-5 steps with a railing? : A Little 6 Click Score: 23    End of Session Equipment Utilized During Treatment: Oxygen Activity Tolerance: Patient tolerated treatment well Patient left: in chair;with call bell/phone within reach Nurse Communication: Mobility status PT Visit Diagnosis: Muscle weakness (generalized) (M62.81);Unsteadiness on feet (R26.81);Other abnormalities of gait and mobility (R26.89)    Time: 7948-0165 PT Time Calculation (min) (ACUTE ONLY): 28 min   Charges:   PT Evaluation $PT Eval Moderate Complexity: 1 Mod PT Treatments $Therapeutic Activity: 23-37 mins        12:14 PM, 01/01/22 Atul Delucia [facility] [place] [phone] MO:[phone]

## 2022-01-01 NOTE — Care Management Important Message (Signed)
Important Message  Patient Details  Name: [name] MRN: [id number] Date of Birth: 1956-06-14   Medicare Important Message Given:  N/A - LOS <3 / Initial given by admissions     Tommy Medal 01/01/2022, 11:15 AM

## 2022-01-01 NOTE — Discharge Summary (Signed)
Physician Discharge Summary   Patient: [name] MRN: [id number] DOB: 1956-11-09  Admit date:     12/30/2021  Discharge date: 01/01/22  Discharge Physician: Dessa Phi   PCP: [name], MD   Recommendations at discharge:    Follow up with PCP   Discharge Diagnoses: Principal Problem:   Acute on chronic respiratory failure with hypoxia and hypercapnia (HCC) Active Problems:   COPD with acute exacerbation (HCC)   CAP (community acquired pneumonia)   Hypertension   Hyperlipidemia  Resolved Problems:   * No resolved hospital problems. *   Hospital Course: [name] is a 66 y.o. female with medical history significant of COPD, chronic hypoxic respiratory failure on 3L O2 at baseline, HLD, HTN, who presents with chief complaint of shortness of breath.  She admits to shortness of breath as well as productive cough of white phlegm.  Denies any fevers or chills, denies any chest pain, nausea, vomiting or abdominal pain.  She is a former smoker and quit in about 2013.  She was admitted for respiratory failure secondary to pneumonia and COPD exacerbation.  She required BiPAP on admission.  Patient was quickly weaned off BiPAP in the emergency department and remained stable on her home 3 L of oxygen.  She continued to improve.  On day of discharge, patient was feeling well on her 3 L nasal cannula O2 without worsening shortness of breath.  She felt ready to discharge home.  Assessment and Plan: * Acute on chronic respiratory failure with hypoxia and hypercapnia (HCC)- (present on admission) Secondary to pneumonia and COPD exacerbation Requires 3 L nasal cannula O2 at baseline Required BiPAP on admission, now down to 3 L  CAP (community acquired pneumonia)- (present on admission) Rocephin, azithromycin --> Keflex, azithromycin for discharge  COPD with acute exacerbation ([place])- (present on admission) Prednisone taper, breathing treatments  Hyperlipidemia- (present on  admission) Switch zocor to lipitor due to drug-interaction with norvasc   Hypertension- (present on admission) Continue norvasc           Consultants: None Procedures performed: None   Disposition: Home Diet recommendation:  Cardiac diet  DISCHARGE MEDICATION: Allergies as of 01/01/2022   No Known Allergies      Medication List     STOP taking these medications    ondansetron 4 MG disintegrating tablet Commonly known as: Zofran ODT       TAKE these medications    albuterol 108 (90 Base) MCG/ACT inhaler Commonly known as: VENTOLIN HFA Inhale 2 puffs into the lungs every 6 (six) hours as needed for wheezing or shortness of breath.   amLODipine 5 MG tablet Commonly known as: NORVASC Take 5 mg by mouth at bedtime.   aspirin EC 81 MG tablet Take 81 mg by mouth daily.   atorvastatin 40 MG tablet Commonly known as: LIPITOR Take 1 tablet (40 mg total) by mouth daily. Start taking on: January 02, 2022   azithromycin 500 MG tablet Commonly known as: Zithromax Take 1 tablet (500 mg total) by mouth daily for 2 days.   cephALEXin 500 MG capsule Commonly known as: KEFLEX Take 1 capsule (500 mg total) by mouth 4 (four) times daily for 2 days.   ferrous sulfate 325 (65 FE) MG tablet Take 325 mg by mouth every other day.   gabapentin 600 MG tablet Commonly known as: NEURONTIN Take 600 mg by mouth 2 (two) times daily.   ipratropium 17 MCG/ACT inhaler Commonly known as: ATROVENT HFA Inhale 2 puffs  into the lungs every 6 (six) hours.   levalbuterol 1.25 MG/3ML nebulizer solution Commonly known as: XOPENEX Inhale into the lungs.   magnesium oxide 400 (240 Mg) MG tablet Commonly known as: MAG-OX Take 1 tablet (400 mg total) by mouth 2 (two) times daily.   MUCOMYST-10 IN Inhale into the lungs.   nortriptyline 25 MG capsule Commonly known as: PAMELOR Take 25 mg by mouth at bedtime.   OXYGEN Inhale 3 L into the lungs continuous.   pantoprazole 40 MG  tablet Commonly known as: PROTONIX Take 1 tablet (40 mg total) by mouth daily.   predniSONE 5 MG tablet Commonly known as: DELTASONE Take 5 mg by mouth daily with breakfast. What changed: Another medication with the same name was added. Make sure you understand how and when to take each.   predniSONE 10 MG tablet Commonly known as: DELTASONE Take 4 tabs for 3 days, then 3 tabs for 3 days, then 2 tabs for 3 days, then 1 tab for 3 days, then 1/2 tab daily. What changed: You were already taking a medication with the same name, and this prescription was added. Make sure you understand how and when to take each.   Trelegy Ellipta 100-62.5-25 MCG/ACT Aepb Generic drug: Fluticasone-Umeclidin-Vilant Inhale into the lungs.   Zejula 100 MG capsule Generic drug: niraparib tosylate Take 2 capsules (200 mg total) by mouth daily. May take at bedtime to reduce nausea and vomiting.        Follow-up Information     Adamo, Hattie Perch, MD Follow up.   Specialty: Family Medicine Contact information: [place] [address] [phone]                 Discharge Exam: Danley Danker Weights   12/30/21 0656  Weight: 63 kg   Examination: General exam: Appears calm and comfortable  Respiratory system: Diminished breath sounds bilaterally without wheeze or rhonchi, no respiratory distress, no conversational dyspnea  Cardiovascular system: S1 & S2 heard, RRR. No pedal edema. Gastrointestinal system: Abdomen is nondistended, soft and nontender. Normal bowel sounds heard. Central nervous system: Alert and oriented. Non focal exam. Speech clear  Extremities: Symmetric in appearance bilaterally  Skin: No rashes, lesions or ulcers on exposed skin  Psychiatry: Judgement and insight appear stable. Mood & affect appropriate.    Condition at discharge: stable  The results of significant diagnostics from this hospitalization (including imaging, microbiology, ancillary and laboratory)  are listed below for reference.   Imaging Studies: DG Chest Port 1 View  Result Date: 12/30/2021 CLINICAL DATA:  Shortness of breath and productive cough for a few days EXAM: PORTABLE CHEST 1 VIEW COMPARISON:  11/02/2021 radiograph and chest CT 10/30/2021 FINDINGS: Chronic generalized interstitial coarsening. Accentuated markings with reticulonodular opacity at the left base where there is likely bronchopneumonia. Large lung volumes with diaphragm flattening. Left porta catheter with tip at the SVC. Normal heart size and mediastinal contours. IMPRESSION: 1. Pneumonia the left lung base. 2. Chronic lung disease/COPD. Electronically Signed   By: Jorje Guild M.D.   On: 12/30/2021 07:17    Microbiology: Results for orders placed or performed during the hospital encounter of 12/30/21  Culture, blood (Routine X 2) w Reflex to ID Panel     Status: None (Preliminary result)   Collection Time: 12/30/21  7:00 AM   Specimen: BLOOD  Result Value Ref Range Status   Specimen Description BLOOD  Final   Special Requests NONE  Final   Culture   Final  NO GROWTH 1 DAY Performed at [facility], [address] [place], [place] [address]    Report Status PENDING  Incomplete  Culture, blood (Routine X 2) w Reflex to ID Panel     Status: None (Preliminary result)   Collection Time: 12/30/21  7:00 AM   Specimen: BLOOD  Result Value Ref Range Status   Specimen Description BLOOD  Final   Special Requests NONE  Final   Culture   Final    NO GROWTH 1 DAY Performed at [facility], [address] [place], [place] [address]    Report Status PENDING  Incomplete  Resp Panel by RT-PCR (Flu A&B, Covid) Nasopharyngeal Swab     Status: None   Collection Time: 12/30/21  7:00 AM   Specimen: Nasopharyngeal Swab; Nasopharyngeal(NP) swabs in vial transport medium  Result Value Ref Range Status   SARS Coronavirus 2 by RT PCR NEGATIVE NEGATIVE Final    Comment: (NOTE) SARS-CoV-2 target nucleic acids are NOT  DETECTED.  The SARS-CoV-2 RNA is generally detectable in upper respiratory specimens during the acute phase of infection. The lowest concentration of SARS-CoV-2 viral copies this assay can detect is 138 copies/mL. A negative result does not preclude SARS-Cov-2 infection and should not be used as the sole basis for treatment or other patient management decisions. A negative result may occur with  improper specimen collection/handling, submission of specimen other than nasopharyngeal swab, presence of viral mutation(s) within the areas targeted by this assay, and inadequate number of viral copies(<138 copies/mL). A negative result must be combined with clinical observations, patient history, and epidemiological information. The expected result is Negative.  Fact Sheet for Patients:  EntrepreneurPulse.com.au  Fact Sheet for Healthcare Providers:  IncredibleEmployment.be  This test is no t yet approved or cleared by the [place] FDA and  has been authorized for detection and/or diagnosis of SARS-CoV-2 by FDA under an Emergency Use Authorization (EUA). This EUA will remain  in effect (meaning this test can be used) for the duration of the COVID-19 declaration under Section 564(b)(1) of the Act, 21 U.S.C.section 360bbb-3(b)(1), unless the authorization is terminated  or revoked sooner.       Influenza A by PCR NEGATIVE NEGATIVE Final   Influenza B by PCR NEGATIVE NEGATIVE Final    Comment: (NOTE) The Xpert Xpress SARS-CoV-2/FLU/RSV plus assay is intended as an aid in the diagnosis of influenza from Nasopharyngeal swab specimens and should not be used as a sole basis for treatment. Nasal washings and aspirates are unacceptable for Xpert Xpress SARS-CoV-2/FLU/RSV testing.  Fact Sheet for Patients: EntrepreneurPulse.com.au  Fact Sheet for Healthcare Providers: IncredibleEmployment.be  This test is not yet  approved or cleared by the [place] FDA and has been authorized for detection and/or diagnosis of SARS-CoV-2 by FDA under an Emergency Use Authorization (EUA). This EUA will remain in effect (meaning this test can be used) for the duration of the COVID-19 declaration under Section 564(b)(1) of the Act, 21 U.S.C. section 360bbb-3(b)(1), unless the authorization is terminated or revoked.  Performed at [facility], [address], [place], [place] [address]     Labs: CBC: Recent Labs  Lab 12/30/21 0700 12/31/21 0319  WBC 7.4 6.7  NEUTROABS 3.7  --   HGB 9.9* 8.7*  HCT 31.4* 28.5*  MCV 103.6* 100.4*  PLT 115* 326*   Basic Metabolic Panel: Recent Labs  Lab 12/30/21 0700 12/31/21 0319  NA 138 138  K 3.6 3.7  CL 96* 95*  CO2 35* 32  GLUCOSE 124* 116*  BUN 9 11  CREATININE 0.57 0.54  CALCIUM 8.4* 8.8*   Liver Function Tests: Recent Labs  Lab 12/30/21 0700  AST 27  ALT 13  ALKPHOS 83  BILITOT 0.4  PROT 6.7  ALBUMIN 3.7   CBG: No results for input(s): GLUCAP in the last 168 hours.  Discharge time spent: less than 30 minutes.  Signed: Dessa Phi, DO [facility] 01/01/2022

## 2022-01-04 LAB — CULTURE, BLOOD (ROUTINE X 2)
Culture: NO GROWTH
Culture: NO GROWTH

## 2022-01-16 ENCOUNTER — Other Ambulatory Visit (HOSPITAL_COMMUNITY): Payer: Self-pay

## 2022-01-21 ENCOUNTER — Ambulatory Visit (HOSPITAL_COMMUNITY): Payer: Medicare HMO

## 2022-01-21 ENCOUNTER — Other Ambulatory Visit: Payer: Self-pay

## 2022-01-21 ENCOUNTER — Other Ambulatory Visit (HOSPITAL_COMMUNITY): Payer: Medicare HMO

## 2022-01-21 ENCOUNTER — Inpatient Hospital Stay (HOSPITAL_COMMUNITY): Payer: Medicare HMO | Attending: Hematology

## 2022-01-21 DIAGNOSIS — I10 Essential (primary) hypertension: Secondary | ICD-10-CM | POA: Insufficient documentation

## 2022-01-21 DIAGNOSIS — G629 Polyneuropathy, unspecified: Secondary | ICD-10-CM | POA: Diagnosis not present

## 2022-01-21 DIAGNOSIS — C569 Malignant neoplasm of unspecified ovary: Secondary | ICD-10-CM | POA: Diagnosis not present

## 2022-01-21 DIAGNOSIS — J432 Centrilobular emphysema: Secondary | ICD-10-CM | POA: Diagnosis not present

## 2022-01-21 DIAGNOSIS — Z87891 Personal history of nicotine dependence: Secondary | ICD-10-CM | POA: Diagnosis not present

## 2022-01-21 DIAGNOSIS — C786 Secondary malignant neoplasm of retroperitoneum and peritoneum: Secondary | ICD-10-CM | POA: Insufficient documentation

## 2022-01-21 LAB — CBC WITH DIFFERENTIAL/PLATELET
Abs Immature Granulocytes: 0.01 10*3/uL (ref 0.00–0.07)
Basophils Absolute: 0 10*3/uL (ref 0.0–0.1)
Basophils Relative: 1 %
Eosinophils Absolute: 0.1 10*3/uL (ref 0.0–0.5)
Eosinophils Relative: 2 %
HCT: 30.1 % — ABNORMAL LOW (ref 36.0–46.0)
Hemoglobin: 9.4 g/dL — ABNORMAL LOW (ref 12.0–15.0)
Immature Granulocytes: 0 %
Lymphocytes Relative: 39 %
Lymphs Abs: 1.2 10*3/uL (ref 0.7–4.0)
MCH: 32.5 pg (ref 26.0–34.0)
MCHC: 31.2 g/dL (ref 30.0–36.0)
MCV: 104.2 fL — ABNORMAL HIGH (ref 80.0–100.0)
Monocytes Absolute: 0.3 10*3/uL (ref 0.1–1.0)
Monocytes Relative: 10 %
Neutro Abs: 1.4 10*3/uL — ABNORMAL LOW (ref 1.7–7.7)
Neutrophils Relative %: 48 %
Platelets: 103 10*3/uL — ABNORMAL LOW (ref 150–400)
RBC: 2.89 MIL/uL — ABNORMAL LOW (ref 3.87–5.11)
RDW: 15.6 % — ABNORMAL HIGH (ref 11.5–15.5)
WBC Morphology: REACTIVE
WBC: 3 10*3/uL — ABNORMAL LOW (ref 4.0–10.5)
nRBC: 0 % (ref 0.0–0.2)

## 2022-01-21 LAB — COMPREHENSIVE METABOLIC PANEL
ALT: 14 U/L (ref 0–44)
AST: 24 U/L (ref 15–41)
Albumin: 3.6 g/dL (ref 3.5–5.0)
Alkaline Phosphatase: 70 U/L (ref 38–126)
Anion gap: 7 (ref 5–15)
BUN: 8 mg/dL (ref 8–23)
CO2: 35 mmol/L — ABNORMAL HIGH (ref 22–32)
Calcium: 8.8 mg/dL — ABNORMAL LOW (ref 8.9–10.3)
Chloride: 95 mmol/L — ABNORMAL LOW (ref 98–111)
Creatinine, Ser: 0.7 mg/dL (ref 0.44–1.00)
GFR, Estimated: >60 mL/min (ref >60)
Glucose, Bld: 100 mg/dL — ABNORMAL HIGH (ref 70–99)
Potassium: 4.4 mmol/L (ref 3.5–5.1)
Sodium: 137 mmol/L (ref 135–145)
Total Bilirubin: 0.4 mg/dL (ref 0.3–1.2)
Total Protein: 6.8 g/dL (ref 6.5–8.1)

## 2022-01-22 LAB — CA 125: Cancer Antigen (CA) 125: 44.7 U/mL — ABNORMAL HIGH (ref 0.0–38.1)

## 2022-01-24 ENCOUNTER — Other Ambulatory Visit (HOSPITAL_COMMUNITY): Payer: Self-pay

## 2022-01-28 ENCOUNTER — Other Ambulatory Visit: Payer: Self-pay

## 2022-01-28 ENCOUNTER — Ambulatory Visit (HOSPITAL_COMMUNITY)
Admission: RE | Admit: 2022-01-28 | Discharge: 2022-01-28 | Disposition: A | Discharge status: Home / Self Care | Payer: Medicare HMO | Source: Ambulatory Visit | Attending: Neurology | Admitting: Neurology

## 2022-01-28 DIAGNOSIS — C799 Secondary malignant neoplasm of unspecified site: Secondary | ICD-10-CM | POA: Insufficient documentation

## 2022-01-28 DIAGNOSIS — C569 Malignant neoplasm of unspecified ovary: Secondary | ICD-10-CM | POA: Diagnosis present

## 2022-01-28 DIAGNOSIS — K5730 Diverticulosis of large intestine without perforation or abscess without bleeding: Secondary | ICD-10-CM | POA: Diagnosis not present

## 2022-01-28 DIAGNOSIS — J439 Emphysema, unspecified: Secondary | ICD-10-CM | POA: Insufficient documentation

## 2022-01-28 DIAGNOSIS — C786 Secondary malignant neoplasm of retroperitoneum and peritoneum: Secondary | ICD-10-CM | POA: Insufficient documentation

## 2022-01-28 DIAGNOSIS — I700 Atherosclerosis of aorta: Secondary | ICD-10-CM | POA: Diagnosis not present

## 2022-01-28 DIAGNOSIS — I6782 Cerebral ischemia: Secondary | ICD-10-CM | POA: Insufficient documentation

## 2022-01-28 MED ORDER — IOHEXOL 300 MG/ML  SOLN
100.0000 mL | Freq: Once | INTRAMUSCULAR | Status: AC | PRN
  Administered 2022-01-28: 80 mL via INTRAVENOUS

## 2022-01-29 ENCOUNTER — Inpatient Hospital Stay (HOSPITAL_COMMUNITY): Payer: Medicare HMO | Admitting: Hematology

## 2022-01-29 ENCOUNTER — Inpatient Hospital Stay (HOSPITAL_COMMUNITY): Payer: Medicare HMO

## 2022-01-29 VITALS — BP 156/75 | HR 103 | Temp 97.8°F | Resp 19 | Ht 60.0 in | Wt 127.1 lb

## 2022-01-29 DIAGNOSIS — C569 Malignant neoplasm of unspecified ovary: Secondary | ICD-10-CM | POA: Diagnosis not present

## 2022-01-29 MED ORDER — HEPARIN SOD (PORK) LOCK FLUSH 100 UNIT/ML IV SOLN
500.0000 [IU] | Freq: Once | INTRAVENOUS | Status: AC
  Administered 2022-01-29: 500 [IU] via INTRAVENOUS

## 2022-01-29 MED ORDER — SODIUM CHLORIDE 0.9% FLUSH
10.0000 mL | INTRAVENOUS | Status: DC | PRN
Start: 2022-01-29 — End: 2022-01-29
  Administered 2022-01-29: 10 mL via INTRAVENOUS

## 2022-01-29 NOTE — Progress Notes (Signed)
? ?[facility] ?[address] ?[place], [place] [address] ? ? ?CLINIC:  ?Medical Oncology/Hematology ? ?PCP:  ?[name], MD ?[address] / [place] [address] ?[phone] ? ? ?REASON FOR VISIT:  ?Follow-up for high-grade serous ovarian carcinoma ? ?PRIOR THERAPY: none ? ?NGS Results: BRCA 1/2 negative ? ?CURRENT THERAPY: Carboplatin, paclitaxel & Aloxi every 3 weeks ? ?BRIEF ONCOLOGIC HISTORY:  ?Oncology History  ?Peritoneal carcinomatosis ([place])  ?06/06/2020 Initial Diagnosis  ? Peritoneal carcinomatosis [facility]) ?  ?07/05/2020 -  Chemotherapy  ?  Patient is on Treatment Plan: OVARIAN CARBOPLATIN (AUC 6) / PACLITAXEL (175) Q21D X 6 CYCLES ? ?  ? ?  ?07/22/2020 Genetic Testing  ? Negative genetic testing: no pathogenic variants detected in [place] Expanded CancerNext Panel + RNAInsight. The CancerNext-Expanded gene panel offered by [facility] and includes sequencing and rearrangement analysis for the following 77 genes: AIP, ALK, APC*, ATM*, AXIN2, BAP1, BARD1, BLM, BMPR1A, BRCA1*, BRCA2*, BRIP1*, CDC73, CDH1*, CDK4, CDKN1B, CDKN2A, CHEK2*, CTNNA1, DICER1, FANCC, FH, FLCN, GALNT12, KIF1B, LZTR1, MAX, MEN1, MET, MLH1*, MSH2*, MSH3, MSH6*, MUTYH*, NBN, NF1*, NF2, NTHL1, PALB2*, PHOX2B, PMS2*, POT1, PRKAR1A, PTCH1, PTEN*, RAD51C*, RAD51D*, RB1, RECQL, RET, SDHA, SDHAF2, SDHB, SDHC, SDHD, SMAD4, SMARCA4, SMARCB1, SMARCE1, STK11, SUFU, TMEM127, TP53*, TSC1, TSC2, VHL and XRCC2 (sequencing and deletion/duplication); EGFR, EGLN1, HOXB13, KIT, MITF, PDGFRA, POLD1, and POLE (sequencing only); EPCAM and GREM1 (deletion/duplication only). DNA and RNA analyses performed for * genes. The report date is July 22, 2020.  ?  ?Ovarian cancer ([place])  ?07/07/2020 Initial Diagnosis  ? Ovarian cancer [facility]) ?  ?05/04/2021 Imaging  ? 1. No findings today to suggest new or progressive disease related to ovarian cancer. ?2. Interval near complete resolution of the irregular and nodular areas of consolidative left upper  lobe opacity seen on the previous study. Residual architectural distortion in this region is compatible with scarring. Tiny nodules persist in the left upper lobe and attention on follow-up recommended. ?3. Interval improvement in tree-in-bud nodularity anteromedial left upper lobe. Changes of bronchial wall thickening and mild cylindrical bronchiectasis in the lower lungs are similar to prior with persistent tree-in-bud nodularity posterior left lower lobe. Imaging features suggest sequelae of atypical infection. ?4. Similar appearance of the omental soft tissue stranding with less prominent nodularity on today's study. ?5. No ascites. ?6. Mild circumferential wall thickening distal esophagus. ?Esophagitis could have this appearance. ?7. Left colonic diverticulosis without diverticulitis. ?8. Aortic Atherosclerosis (ICD10-I70.0) and Emphysema (ICD10-J43.9). ?  ?05/30/2021 -  Chemotherapy  ? She takes niraparib ? ?  ? ?  ? ? ?CANCER STAGING: ? Cancer Staging  ?No matching staging information was found for the patient. ? ?INTERVAL HISTORY:  ?[name], a 66 y.o. female, returns for routine follow-up of her high-grade serous ovarian carcinoma. [name] was last seen on 12/10/2021.  ? ?Today she reports feeling good. She reports she has been eating well, and her appetite is good. She has gained 9 lbs since 11/05/21.  ? ?REVIEW OF SYSTEMS:  ?Review of Systems  ?Constitutional:  Negative for appetite change and fatigue.  ?Respiratory:  Positive for cough and shortness of breath.   ?All other systems reviewed and are negative. ? ?PAST MEDICAL/SURGICAL HISTORY:  ?Past Medical History:  ?Diagnosis Date  ? Anemia   ? Aortic atherosclerosis ([place])   ? Arthritis   ? Asthma   ? Bilateral carotid artery stenosis   ? Cancer [facility])   ? Gastric Cancer  ? COPD (chronic obstructive pulmonary  disease) ([place])   ? High cholesterol   ? Hyperlipidemia   ? Hypertension   ? Neuropathy   ? Osteoarthritis   ? Ovarian cancer ([place])   ? Oxygen  dependent   ? Peripheral vascular disease ([place])   ? Peritoneal carcinoma ([place])   ? Pneumonia 2019  ? Port-A-Cath in place 07/03/2020  ? Pulmonary emphysema ([place])   ? ?Past Surgical History:  ?Procedure Laterality Date  ? ESOPHAGOGASTRODUODENOSCOPY (EGD) WITH PROPOFOL N/A 02/17/2021  ? Procedure: ESOPHAGOGASTRODUODENOSCOPY (EGD) WITH PROPOFOL;  Surgeon: Harvel Quale, MD;  [facility];  Service: Gastroenterology;  Laterality: N/A;  ? HALLUX VALGUS BASE WEDGE Right 06/09/2015  ? Procedure: Base wedge osteotomy with modified McBride right foot ;  Surgeon: Sharlotte Alamo, MD;  Location: [facility] ORS;  Service: Podiatry;  Laterality: Right;  ? PORTACATH PLACEMENT Left 06/28/2020  ? Procedure: PORT-A-CATHETER PLACEMENT LEFT CHEST (attached catheter in left subclavian);  Surgeon: Virl Cagey, MD;  Location: [facility] ORS;  Service: General;  Laterality: Left;  ? TUBAL LIGATION    ? VIDEO BRONCHOSCOPY WITH ENDOBRONCHIAL NAVIGATION N/A 03/09/2021  ? Procedure: VIDEO BRONCHOSCOPY WITH ENDOBRONCHIAL NAVIGATION;  Surgeon: Ottie Glazier, MD;  Location: [facility] ORS;  Service: Thoracic;  Laterality: N/A;  ? VIDEO BRONCHOSCOPY WITH ENDOBRONCHIAL ULTRASOUND N/A 03/09/2021  ? Procedure: VIDEO BRONCHOSCOPY WITH ENDOBRONCHIAL ULTRASOUND;  Surgeon: Ottie Glazier, MD;  Location: [facility] ORS;  Service: Thoracic;  Laterality: N/A;  ? ? ?SOCIAL HISTORY:  ?Social History  ? ?Socioeconomic History  ? Marital status: Divorced  ?  Spouse name: Not on file  ? Number of children: 3  ? Years of education: Not on file  ? Highest education level: Not on file  ?Occupational History  ? Occupation: DISABLED  ?Tobacco Use  ? Smoking status: Former  ?  Packs/day: 2.00  ?  Years: 30.00  ?  Pack years: 60.00  ?  Types: Cigarettes  ?  Quit date: 11/04/2013  ?  Years since quitting: 8.2  ? Smokeless tobacco: Never  ?Vaping Use  ? Vaping Use: Never used  ?Substance and Sexual Activity  ? Alcohol use: No  ? Drug use: No  ? Sexual activity: Not Currently   ?Other Topics Concern  ? Not on file  ?Social History Narrative  ? Not on file  ? ?Social Determinants of Health  ? ?Financial Resource Strain: Not on file  ?Food Insecurity: Not on file  ?Transportation Needs: Not on file  ?Physical Activity: Not on file  ?Stress: Not on file  ?Social Connections: Not on file  ?Intimate Partner Violence: Not on file  ? ? ?FAMILY HISTORY:  ?Family History  ?Problem Relation Age of Onset  ? Alzheimer's disease Mother   ? COPD Father   ? Emphysema Father   ? Hypertension Father   ? Healthy Sister   ? Healthy Brother   ? Alzheimer's disease Maternal Grandmother   ? Healthy Sister   ? Healthy Sister   ? Healthy Sister   ? Prostate cancer Other   ?     paternal grandmother's brother; dx in early 44s  ? Breast cancer Neg Hx   ? ? ?CURRENT MEDICATIONS:  ?Current Outpatient Medications  ?Medication Sig Dispense Refill  ? Acetylcysteine (MUCOMYST-10 IN) Inhale into the lungs.    ? albuterol (VENTOLIN HFA) 108 (90 Base) MCG/ACT inhaler Inhale 2 puffs into the lungs every 6 (six) hours as needed for wheezing or shortness of breath.    ? amLODipine (NORVASC) 5 MG tablet  Take 5 mg by mouth at bedtime.     ? aspirin EC 81 MG tablet Take 81 mg by mouth daily.    ? atorvastatin (LIPITOR) 40 MG tablet Take 1 tablet (40 mg total) by mouth daily. 30 tablet 1  ? ferrous sulfate 325 (65 FE) MG tablet Take 325 mg by mouth every other day.    ? Fluticasone-Umeclidin-Vilant (TRELEGY ELLIPTA) 100-62.5-25 MCG/ACT AEPB Inhale into the lungs.    ? gabapentin (NEURONTIN) 600 MG tablet Take 600 mg by mouth 2 (two) times daily.    ? ipratropium (ATROVENT HFA) 17 MCG/ACT inhaler Inhale 2 puffs into the lungs every 6 (six) hours.    ? levalbuterol (XOPENEX) 1.25 MG/3ML nebulizer solution Inhale into the lungs.    ? magnesium oxide (MAG-OX) 400 (240 Mg) MG tablet Take 1 tablet (400 mg total) by mouth 2 (two) times daily. 120 tablet 0  ? niraparib tosylate (ZEJULA) 100 MG capsule Take 2 capsules (200 mg total) by  mouth daily. May take at bedtime to reduce nausea and vomiting. 60 capsule 11  ? nortriptyline (PAMELOR) 25 MG capsule Take 25 mg by mouth at bedtime.    ? Omeprazole-Sodium Bicarbonate (ZEGERID) 20-110

## 2022-01-29 NOTE — Progress Notes (Signed)
[name] presented for Portacath access and flush.  Portacath located left chest wall accessed with  H 20 needle.  Good blood return present. Portacath flushed with 57m NS and 500U/527mHeparin and needle removed intact. No bruising or swelling noted at the site. Procedure tolerated well and without incident.    ? ?Discharged from clinic ambulatory in stable condition. Alert and oriented x 3. [facility]   ?

## 2022-01-29 NOTE — Patient Instructions (Addendum)
[place] at [facility] ?Discharge Instructions ? ?You were seen and examined today by [name]. He reviewed your most recent labs and everything looks good. Please keep follow up appointments as scheduled in 2 months. ? ? ?Thank you for choosing [place] at [facility] to provide your oncology and hematology care.  To afford each patient quality time with our provider, please arrive at least 15 minutes before your scheduled appointment time.  ? ?If you have a lab appointment with the [facility] please come in thru the Main Entrance and check in at the main information desk. ? ?You need to re-schedule your appointment should you arrive 10 or more minutes late.  We strive to give you quality time with our providers, and arriving late affects you and other patients whose appointments are after yours.  Also, if you no show three or more times for appointments you may be dismissed from the clinic at the providers discretion.     ?Again, thank you for choosing [facility].  Our hope is that these requests will decrease the amount of time that you wait before being seen by our physicians.       ?_____________________________________________________________ ? ?Should you have questions after your visit to [facility], please contact our office at [phone] and follow the prompts.  Our office hours are 8:00 a.m. and 4:30 p.m. Monday - Friday.  Please note that voicemails left after 4:00 p.m. may not be returned until the following business day.  We are closed weekends and major holidays.  You do have access to a nurse 24-7, just call the main number to the clinic [phone] and do not press any options, hold on the line and a nurse will answer the phone.   ? ?For prescription refill requests, have your pharmacy contact our office and allow 72 hours.   ? ?Due to Covid, you will need to wear a mask upon entering the hospital. If you do  not have a mask, a mask will be given to you at the Main Entrance upon arrival. For doctor visits, patients may have 1 support person age 97 or older with them. For treatment visits, patients can not have anyone with them due to social distancing guidelines and our immunocompromised population.  ? ?  ?

## 2022-01-29 NOTE — Patient Instructions (Signed)
[facility]  Discharge Instructions: ?Thank you for choosing [facility] to provide your oncology and hematology care.  ?If you have a lab appointment with the Cancer Center, please come in thru the Main Entrance and check in at the main information desk. ? ?Wear comfortable clothing and clothing appropriate for easy access to any Portacath or PICC line.  ? ?We strive to give you quality time with your provider. You may need to reschedule your appointment if you arrive late (15 or more minutes).  Arriving late affects you and other patients whose appointments are after yours.  Also, if you miss three or more appointments without notifying the office, you may be dismissed from the clinic at the provider?s discretion.    ?  ?For prescription refill requests, have your pharmacy contact our office and allow 72 hours for refills to be completed.   ? ?Today you received port flush ?  ? ?BELOW ARE SYMPTOMS THAT SHOULD BE REPORTED IMMEDIATELY: ?*FEVER GREATER THAN 100.4 F (38 ?C) OR HIGHER ?*CHILLS OR SWEATING ?*NAUSEA AND VOMITING THAT IS NOT CONTROLLED WITH YOUR NAUSEA MEDICATION ?*UNUSUAL SHORTNESS OF BREATH ?*UNUSUAL BRUISING OR BLEEDING ?*URINARY PROBLEMS (pain or burning when urinating, or frequent urination) ?*BOWEL PROBLEMS (unusual diarrhea, constipation, pain near the anus) ?TENDERNESS IN MOUTH AND THROAT WITH OR WITHOUT PRESENCE OF ULCERS (sore throat, sores in mouth, or a toothache) ?UNUSUAL RASH, SWELLING OR PAIN  ?UNUSUAL VAGINAL DISCHARGE OR ITCHING  ? ?Items with * indicate a potential emergency and should be followed up as soon as possible or go to the Emergency Department if any problems should occur. ? ?Please show the CHEMOTHERAPY ALERT CARD or IMMUNOTHERAPY ALERT CARD at check-in to the Emergency Department and triage nurse. ? ?Should you have questions after your visit or need to cancel or reschedule your appointment, please contact [facility] [phone]  and  follow the prompts.  Office hours are 8:00 a.m. to 4:30 p.m. Monday - Friday. Please note that voicemails left after 4:00 p.m. may not be returned until the following business day.  We are closed weekends and major holidays. You have access to a nurse at all times for urgent questions. Please call the main number to the clinic [phone] and follow the prompts. ? ?For any non-urgent questions, you may also contact your provider using MyChart. We now offer e-Visits for anyone 18 and older to request care online for non-urgent symptoms. For details visit [email or web address]. ?  ?Also download the MyChart app! Go to the app store, search "MyChart", open the app, select [facility], and log in with your MyChart username and password. ? ?Due to Covid, a mask is required upon entering the hospital/clinic. If you do not have a mask, one will be given to you upon arrival. For doctor visits, patients may have 1 support person aged 18 or older with them. For treatment visits, patients cannot have anyone with them due to current Covid guidelines and our immunocompromised population.  ?

## 2022-02-12 ENCOUNTER — Other Ambulatory Visit (HOSPITAL_COMMUNITY): Payer: Self-pay

## 2022-02-13 ENCOUNTER — Telehealth (HOSPITAL_COMMUNITY): Payer: Self-pay | Admitting: *Deleted

## 2022-02-13 NOTE — Telephone Encounter (Signed)
Patient called with new onset abdominal pain that she states is very similar to her pain when she was diagnosed with cancer.  States she is having regular bowel movements and no other associated symptoms.  Per [name] was added to the symptom management clinic schedule for next week. ?

## 2022-02-14 ENCOUNTER — Other Ambulatory Visit (HOSPITAL_COMMUNITY): Payer: Self-pay

## 2022-02-18 ENCOUNTER — Emergency Department (HOSPITAL_COMMUNITY)
Admission: EM | Admit: 2022-02-18 | Discharge: 2022-02-19 | Disposition: A | Discharge status: Home / Self Care | Payer: Medicare HMO | Attending: Emergency Medicine | Admitting: Emergency Medicine

## 2022-02-18 ENCOUNTER — Emergency Department (HOSPITAL_COMMUNITY): Payer: Medicare HMO

## 2022-02-18 ENCOUNTER — Other Ambulatory Visit: Payer: Self-pay

## 2022-02-18 DIAGNOSIS — R0602 Shortness of breath: Secondary | ICD-10-CM | POA: Insufficient documentation

## 2022-02-18 DIAGNOSIS — Z7982 Long term (current) use of aspirin: Secondary | ICD-10-CM | POA: Diagnosis not present

## 2022-02-18 DIAGNOSIS — R000 Tachycardia, unspecified: Secondary | ICD-10-CM

## 2022-02-18 DIAGNOSIS — Z79899 Other long term (current) drug therapy: Secondary | ICD-10-CM | POA: Diagnosis not present

## 2022-02-18 DIAGNOSIS — J449 Chronic obstructive pulmonary disease, unspecified: Secondary | ICD-10-CM | POA: Diagnosis not present

## 2022-02-18 LAB — CBC WITH DIFFERENTIAL/PLATELET
Abs Immature Granulocytes: 0.03 10*3/uL (ref 0.00–0.07)
Basophils Absolute: 0 10*3/uL (ref 0.0–0.1)
Basophils Relative: 1 %
Eosinophils Absolute: 0 10*3/uL (ref 0.0–0.5)
Eosinophils Relative: 0 %
HCT: 29.1 % — ABNORMAL LOW (ref 36.0–46.0)
Hemoglobin: 9.5 g/dL — ABNORMAL LOW (ref 12.0–15.0)
Immature Granulocytes: 1 %
Lymphocytes Relative: 9 %
Lymphs Abs: 0.4 10*3/uL — ABNORMAL LOW (ref 0.7–4.0)
MCH: 32.2 pg (ref 26.0–34.0)
MCHC: 32.6 g/dL (ref 30.0–36.0)
MCV: 98.6 fL (ref 80.0–100.0)
Monocytes Absolute: 0.1 10*3/uL (ref 0.1–1.0)
Monocytes Relative: 3 %
Neutro Abs: 3.5 10*3/uL (ref 1.7–7.7)
Neutrophils Relative %: 86 %
Platelets: 95 10*3/uL — ABNORMAL LOW (ref 150–400)
RBC: 2.95 MIL/uL — ABNORMAL LOW (ref 3.87–5.11)
RDW: 15.6 % — ABNORMAL HIGH (ref 11.5–15.5)
WBC: 4.1 10*3/uL (ref 4.0–10.5)
nRBC: 0 % (ref 0.0–0.2)

## 2022-02-18 LAB — COMPREHENSIVE METABOLIC PANEL
ALT: 13 U/L (ref 0–44)
AST: 31 U/L (ref 15–41)
Albumin: 4 g/dL (ref 3.5–5.0)
Alkaline Phosphatase: 75 U/L (ref 38–126)
Anion gap: 12 (ref 5–15)
BUN: 7 mg/dL — ABNORMAL LOW (ref 8–23)
CO2: 28 mmol/L (ref 22–32)
Calcium: 8.9 mg/dL (ref 8.9–10.3)
Chloride: 97 mmol/L — ABNORMAL LOW (ref 98–111)
Creatinine, Ser: 0.67 mg/dL (ref 0.44–1.00)
GFR, Estimated: >60 mL/min (ref >60)
Glucose, Bld: 164 mg/dL — ABNORMAL HIGH (ref 70–99)
Potassium: 3.4 mmol/L — ABNORMAL LOW (ref 3.5–5.1)
Sodium: 137 mmol/L (ref 135–145)
Total Bilirubin: 0.4 mg/dL (ref 0.3–1.2)
Total Protein: 7.1 g/dL (ref 6.5–8.1)

## 2022-02-18 MED ORDER — LEVALBUTEROL HCL 0.63 MG/3ML IN NEBU
0.6300 mg | INHALATION_SOLUTION | Freq: Once | RESPIRATORY_TRACT | Status: AC
Start: 2022-02-18 — End: 2022-02-19
  Administered 2022-02-19: 0.63 mg via RESPIRATORY_TRACT
  Filled 2022-02-18: qty 3

## 2022-02-18 MED ORDER — IOHEXOL 350 MG/ML SOLN
100.0000 mL | Freq: Once | INTRAVENOUS | Status: AC | PRN
  Administered 2022-02-19: 100 mL via INTRAVENOUS

## 2022-02-18 NOTE — ED Provider Notes (Signed)
?[facility] ?Provider Note ? ? ?CSN: [id number] ?Arrival date & time: 02/18/22  2107 ? ?  ? ?History ? ?Chief Complaint  ?Patient presents with  ? Tachycardia  ? Shortness of Breath  ? ? ?[name] is a 66 y.o. female. ? ?65 yo F w/ h/o COPD, ovarian ca (currently on immunotherapy) and past pneumonia presents to ED as she had tachycardia at home as measured by home health nurse. No other complaints but states she had some sob earlier today and an episode of sharp shooting left sided 'lung' pain all of which have resolved. No fever. No cough. Using her home meds per instructions without any significant changes.  ? ?On review of the records, in the last couple months it appears the patient's heart rate is almost always in the 95-105 range.  ? ? ?Shortness of Breath ? ?  ? ?Home Medications ?Prior to Admission medications   ?Medication Sig Start Date End Date Taking? Authorizing Provider  ?Acetylcysteine (MUCOMYST-10 IN) Inhale into the lungs.    [provider]  ?albuterol (VENTOLIN HFA) 108 (90 Base) MCG/ACT inhaler Inhale 2 puffs into the lungs every 6 (six) hours as needed for wheezing or shortness of breath.    [provider]  ?amLODipine (NORVASC) 5 MG tablet Take 5 mg by mouth at bedtime.     [provider]  ?aspirin EC 81 MG tablet Take 81 mg by mouth daily.    [provider]  ?atorvastatin (LIPITOR) 40 MG tablet Take 1 tablet (40 mg total) by mouth daily. 01/02/22   Dessa Phi, DO  ?ferrous sulfate 325 (65 FE) MG tablet Take 325 mg by mouth every other day.    [provider]  ?Fluticasone-Umeclidin-Vilant (TRELEGY ELLIPTA) 100-62.5-25 MCG/ACT AEPB Inhale into the lungs. 06/18/21   [provider]  ?gabapentin (NEURONTIN) 600 MG tablet Take 600 mg by mouth 2 (two) times daily. 02/02/21   [provider]  ?ipratropium (ATROVENT HFA) 17 MCG/ACT inhaler Inhale 2 puffs into the lungs every 6 (six) hours.    [provider]  ?levalbuterol Penne Lash) 1.25 MG/3ML nebulizer solution Inhale into the lungs. 09/28/21   [provider]  ?magnesium oxide (MAG-OX) 400 (240 Mg) MG tablet Take 1 tablet (400 mg total) by mouth 2 (two) times daily. 12/25/21   Derek Jack, MD  ?niraparib tosylate (ZEJULA) 100 MG capsule Take 2 capsules (200 mg total) by mouth daily. May take at bedtime to reduce nausea and vomiting. 05/23/21   Heath Lark, MD  ?nortriptyline (PAMELOR) 25 MG capsule Take 25 mg by mouth at bedtime.    [provider]  ?Omeprazole-Sodium Bicarbonate (ZEGERID) 20-1100 MG CAPS capsule Take by mouth.    [provider]  ?OXYGEN Inhale 3 L into the lungs continuous.     [provider]  ?predniSONE (DELTASONE) 10 MG tablet Take 4 tabs for 3 days, then 3 tabs for 3 days, then 2 tabs for 3 days, then 1 tab for 3 days, then 1/2 tab daily. 01/01/22   Dessa Phi, DO  ?predniSONE (DELTASONE) 5 MG tablet Take 5 mg by mouth daily with breakfast.    [provider]  ?   ? ?Allergies    ?Patient has no known allergies.   ? ?Review of Systems   ?Review of Systems  ?Respiratory:  Positive for shortness of breath.   ? ?Physical Exam ?Updated Vital Signs ?BP 118/70   Pulse 99   Temp 98.1 ?F (36.7 ?  C) (Oral)   Resp 20   Ht 5' (1.524 m)   Wt 56.2 kg   SpO2 97%   BMI 24.22 kg/m?  ?Physical Exam ?Vitals and nursing note reviewed.  ?Constitutional:   ?   Appearance: She is well-developed.  ?HENT:  ?   Head: Normocephalic and atraumatic.  ?   Mouth/Throat:  ?   Mouth: Mucous membranes are moist.  ?   Pharynx: Oropharynx is clear.  ?Eyes:  ?   Pupils: Pupils are equal, round, and reactive to light.  ?Cardiovascular:  ?   Rate and Rhythm: Regular rhythm. Tachycardia present.  ?Pulmonary:  ?   Effort: Tachypnea present. No respiratory distress.  ?   Breath sounds: No stridor. No decreased breath sounds or wheezing.  ?Abdominal:  ?   General: Abdomen is flat. There is no distension.   ?Musculoskeletal:     ?   General: Normal range of motion.  ?   Cervical back: Normal range of motion.  ?   Right lower leg: No edema.  ?   Left lower leg: No edema.  ?Skin: ?   General: Skin is warm and dry.  ?Neurological:  ?   General: No focal deficit present.  ?   Mental Status: She is alert.  ? ? ?ED Results / Procedures / Treatments   ?Labs ?(all labs ordered are listed, but only abnormal results are displayed) ?Labs Reviewed  ?CBC WITH DIFFERENTIAL/PLATELET - Abnormal; Notable for the following components:  ?    Result Value  ? RBC 2.95 (*)   ? Hemoglobin 9.5 (*)   ? HCT 29.1 (*)   ? RDW 15.6 (*)   ? Platelets 95 (*)   ? Lymphs Abs 0.4 (*)   ? All other components within normal limits  ?COMPREHENSIVE METABOLIC PANEL - Abnormal; Notable for the following components:  ? Potassium 3.4 (*)   ? Chloride 97 (*)   ? Glucose, Bld 164 (*)   ? BUN 7 (*)   ? All other components within normal limits  ? ? ?EKG ?EKG Interpretation ? ?Date/Time:  Monday February 18 2022 21:30:33 EDT ?Ventricular Rate:  116 ?PR Interval:  141 ?QRS Duration: 99 ?QT Interval:  315 ?QTC Calculation: 438 ?R Axis:   97 ?Text Interpretation: Sinus or ectopic atrial tachycardia Right axis deviation Borderline repolarization abnormality since last tracing no significant change Confirmed by Daleen Bo [phone]) on 02/18/2022 9:36:12 PM ? ?Radiology ?DG Chest 2 View ? ?Result Date: 02/18/2022 ?CLINICAL DATA:  Shortness of breath EXAM: CHEST - 2 VIEW COMPARISON:  12/30/2021 FINDINGS: Left Port-A-Cath remains in place, unchanged. Heart is normal size. No confluent airspace opacities or effusions. No acute bony abnormality. IMPRESSION: No active cardiopulmonary disease. Electronically Signed   By: Rolm Baptise M.D.   On: 02/18/2022 23:35  ? ?CT Angio Chest PE W and/or Wo Contrast ? ?Result Date: 02/19/2022 ?CLINICAL DATA:  Tachycardia, shortness of breath. Pulmonary embolism (PE) suspected, high prob EXAM: CT ANGIOGRAPHY CHEST WITH CONTRAST TECHNIQUE:  Multidetector CT imaging of the chest was performed using the standard protocol during bolus administration of intravenous contrast. Multiplanar CT image reconstructions and MIPs were obtained to evaluate the vascular anatomy. RADIATION DOSE REDUCTION: This exam was performed according to the departmental dose-optimization program which includes automated exposure control, adjustment of the mA and/or kV according to patient size and/or use of iterative reconstruction technique. CONTRAST:  176m OMNIPAQUE IOHEXOL 350 MG/ML SOLN COMPARISON:  01/28/2022 FINDINGS: Cardiovascular: No filling defects in the pulmonary  arteries to suggest pulmonary emboli. Heart is normal size. Aorta is normal caliber. Coronary artery and aortic atherosclerosis. Mediastinum/Nodes: No mediastinal, hilar, or axillary adenopathy. Trachea and esophagus are unremarkable. Thyroid unremarkable. Lungs/Pleura: Centrilobular emphysema. Biapical scarring. Left base atelectasis. No confluent opacity on the right. Small scattered pulmonary nodules in the left upper lobe, 2-3 mm in size. No effusions. Upper Abdomen: Imaging into the upper abdomen demonstrates no acute findings. Musculoskeletal: Chest wall soft tissues are unremarkable. No acute bony abnormality. Review of the MIP images confirms the above findings. IMPRESSION: No evidence of pulmonary embolus. Coronary artery disease. Few scattered tiny left upper lobe nodules are stable since prior study. Aortic Atherosclerosis (ICD10-I70.0) and Emphysema (ICD10-J43.9). Electronically Signed   By: Rolm Baptise M.D.   On: 02/19/2022 00:44   ? ?Procedures ?Procedures  ? ? ?Medications Ordered in ED ?Medications  ?levalbuterol (XOPENEX) nebulizer solution 0.63 mg (0.63 mg Nebulization Given 02/19/22 0034)  ?iohexol (OMNIPAQUE) 350 MG/ML injection 100 mL (100 mLs Intravenous Contrast Given 02/19/22 0027)  ? ? ?ED Course/ Medical Decision Making/ A&P ?  ?                        ?Medical Decision Making ?Amount  and/or Complexity of Data Reviewed ?Labs: ordered. ?Radiology: ordered. ? ?Risk ?Prescription drug management. ? ? ?66 year old female with tachycardia at home is asymptomatic.  Here she is anywhere from 95-1 10.  She is asymptoma

## 2022-02-18 NOTE — ED Triage Notes (Signed)
Patient had high heart rate and SHOB with home health nurse today and recently discharged with pneumonia 2 months ago. Patient on 3L/Orient all the time.  ?

## 2022-02-19 ENCOUNTER — Inpatient Hospital Stay (HOSPITAL_COMMUNITY): Payer: Medicare HMO

## 2022-02-19 ENCOUNTER — Inpatient Hospital Stay (HOSPITAL_COMMUNITY): Payer: Medicare HMO | Attending: Hematology | Admitting: Physician Assistant

## 2022-02-19 VITALS — BP 141/74 | HR 104 | Temp 98.7°F | Resp 20 | Ht 58.27 in | Wt 123.0 lb

## 2022-02-19 DIAGNOSIS — C569 Malignant neoplasm of unspecified ovary: Secondary | ICD-10-CM

## 2022-02-19 DIAGNOSIS — C786 Secondary malignant neoplasm of retroperitoneum and peritoneum: Secondary | ICD-10-CM | POA: Insufficient documentation

## 2022-02-19 DIAGNOSIS — R1030 Lower abdominal pain, unspecified: Secondary | ICD-10-CM

## 2022-02-19 LAB — CBC WITH DIFFERENTIAL/PLATELET
Abs Immature Granulocytes: 0.02 10*3/uL (ref 0.00–0.07)
Basophils Absolute: 0.1 10*3/uL (ref 0.0–0.1)
Basophils Relative: 2 %
Eosinophils Absolute: 0 10*3/uL (ref 0.0–0.5)
Eosinophils Relative: 1 %
HCT: 28.3 % — ABNORMAL LOW (ref 36.0–46.0)
Hemoglobin: 9.3 g/dL — ABNORMAL LOW (ref 12.0–15.0)
Immature Granulocytes: 1 %
Lymphocytes Relative: 27 %
Lymphs Abs: 1.1 10*3/uL (ref 0.7–4.0)
MCH: 32.3 pg (ref 26.0–34.0)
MCHC: 32.9 g/dL (ref 30.0–36.0)
MCV: 98.3 fL (ref 80.0–100.0)
Monocytes Absolute: 0.5 10*3/uL (ref 0.1–1.0)
Monocytes Relative: 12 %
Neutro Abs: 2.4 10*3/uL (ref 1.7–7.7)
Neutrophils Relative %: 57 %
Platelets: 91 10*3/uL — ABNORMAL LOW (ref 150–400)
RBC: 2.88 MIL/uL — ABNORMAL LOW (ref 3.87–5.11)
RDW: 15.6 % — ABNORMAL HIGH (ref 11.5–15.5)
Smear Review: DECREASED
WBC: 4.1 10*3/uL (ref 4.0–10.5)
nRBC: 0 % (ref 0.0–0.2)

## 2022-02-19 NOTE — Progress Notes (Signed)
[facility] ?[address] ?[place],  [address] ?Phone: [phone] ?Fax: [phone] ? ?[facility] PROGRESS NOTE  ? ?[name] ?[id number] ?[birth year] ?66 y.o. ? ?[name] is managed by 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 ? ?Actively treated with chemotherapy/immunotherapy/hormonal therapy: YES ? ?Current therapy: Niraparib 200 mg daily ? ?Next scheduled appointment with provider: 04/08/2022 ? ?Subjective:  ?Chief Complaint: Abdominal pain ? ?[name] is managed by 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  She is on immunotherapy with niraparib 200 mg daily.  She was last seen by [name] on 01/29/2022.  Last CT CAP on 01/28/2022 showed stable omental carcinomatosis without new or progressive disease. ? ?Patient called nurse line on 02/13/2022 with new onset abdominal pain that she reported to be very similar to pain when she was diagnosed with cancer.  She had reported regular bowel movements and denied other associated symptoms. ? ?Patient reports episode of sudden onset bilateral lower quadrant abdominal pain last week.  Pain described as burning pain, 7/10 in intensity, that lasted less than 5 minutes and resolved on its own.  She has had a few recurrent episodes of pain since that time, but not as strong.  She denies any association with eating or bowel movements.  She has not had any change in her bowel movements.  She denies any diarrhea or constipation.  No associated nausea, vomiting, fever, or chills.  She denies any dysuria. ? ?She reports that when she was first diagnosed with ovarian cancer, she had a similar type of lower quadrant abdominal pain, but otherwise she has not had much cancer pain throughout her diagnosis and treatment. ? ?Of note, she was also seen in the emergency department last night due to tachycardia, left-sided "lung" pain,  and shortness of breath without fever or cough.  CTA chest was negative for PE, and there was no other evidence for emergent causes of symptoms.  At the time of discharge, patient was at baseline.  She has no new respiratory complaints at the time of her visit today. ? ? ?Review of Systems:  ?Review of Systems  ?Constitutional:  Negative for activity change, appetite change, chills, diaphoresis, fatigue, fever and unexpected weight change.  ?HENT:  Negative for mouth sores, nosebleeds, sore throat and trouble swallowing.   ?Respiratory:  Positive for cough and shortness of breath (COPD, on 3 L of O2).   ?Cardiovascular:  Negative for chest pain, palpitations and leg swelling.  ?Gastrointestinal:  Positive for abdominal pain (None today). Negative for blood in stool, constipation, diarrhea, nausea and vomiting.  ?Genitourinary:  Negative for dysuria and hematuria.  ?Neurological:  Negative for dizziness, light-headedness, numbness and headaches.  ?Psychiatric/Behavioral:  Positive for sleep disturbance. Negative for dysphoric mood. The patient is not nervous/anxious.   ? ? ?Past Medical History, Surgical history, Social history, and Family history were reviewed as documented elsewhere in chart, and were updated as appropriate.  ? ?Assessment & Plan:    ?1.  Abdominal pain ?- Abdominal pain has resolved. ?- Pain pattern is nonconcerning for any red flag/alarm symptoms.  No acute abdomen on exam. ?- Suspect abdominal pain related to her peritoneal carcinomatosis. ?- CT CAP performed within the last month showed stable cancer without any new or progressive disease ?- PLAN: No strong indication for repeat imaging or analgesia at this time. ?- Discussed with patient that we will watch and wait for now.  She is  agreeable. ?- Patient educated on alarm symptoms that would prompt immediate medical evaluation. ? ?2.  High-grade serous ovarian carcinoma with peritoneal carcinomatosis ?- Initial presentation in 2021 with  intermittent RLQ abdominal pain ?- She completed 6 cycles carboplatin and paclitaxel ?- She was not a candidate for surgical resection ?- Currently on niraparib 200 mg daily ?- CT CAP on 01/28/2022 showed stable omental carcinomatosis without new or progressive disease ?- Primary oncologist is [name], further details per his note dated 01/29/2022 ?- PLAN: Follow-up as scheduled with [name] in May 2023 ? ? ?Objective:  ? ?Physical Exam:  ?There were no vitals taken for this visit. ?ECOG: 2 ? ?Physical Exam ?Constitutional:   ?   Appearance: Normal appearance. She is obese.  ?HENT:  ?   Head: Normocephalic and atraumatic.  ?   Mouth/Throat:  ?   Mouth: Mucous membranes are moist.  ?Eyes:  ?   Extraocular Movements: Extraocular movements intact.  ?   Pupils: Pupils are equal, round, and reactive to light.  ?Cardiovascular:  ?   Rate and Rhythm: Normal rate and regular rhythm.  ?   Pulses: Normal pulses.  ?   Heart sounds: Normal heart sounds.  ?Pulmonary:  ?   Effort: Pulmonary effort is normal.  ?   Breath sounds: Normal breath sounds.  ?Abdominal:  ?   General: Bowel sounds are normal.  ?   Palpations: Abdomen is soft.  ?   Tenderness: There is abdominal tenderness (Mild tenderness to palpation of bilateral lower quadrants).  ?Musculoskeletal:     ?   General: No swelling.  ?   Right lower leg: No edema.  ?   Left lower leg: No edema.  ?Lymphadenopathy:  ?   Cervical: No cervical adenopathy.  ?Skin: ?   General: Skin is warm and dry.  ?Neurological:  ?   General: No focal deficit present.  ?   Mental Status: She is alert and oriented to person, place, and time.  ?Psychiatric:     ?   Mood and Affect: Mood normal.     ?   Behavior: Behavior normal.  ? ? ?Lab Review:  ?   ?Component Value Date/Time  ? NA 137 02/18/2022 2305  ? NA 139 11/07/2014 0018  ? K 3.4 (L) 02/18/2022 2305  ? K 3.6 11/07/2014 0018  ? CL 97 (L) 02/18/2022 2305  ? CL 102 11/07/2014 0018  ? CO2 28 02/18/2022 2305  ? CO2 29 11/07/2014  0018  ? GLUCOSE 164 (H) 02/18/2022 2305  ? GLUCOSE 108 (H) 11/07/2014 0018  ? BUN 7 (L) 02/18/2022 2305  ? BUN 6 (L) 11/07/2014 0018  ? CREATININE 0.67 02/18/2022 2305  ? CREATININE 0.65 11/07/2014 0018  ? CALCIUM 8.9 02/18/2022 2305  ? CALCIUM 9.4 11/07/2014 0018  ? PROT 7.1 02/18/2022 2305  ? PROT 6.9 10/18/2014 1210  ? ALBUMIN 4.0 02/18/2022 2305  ? ALBUMIN 3.9 10/18/2014 1210  ? AST 31 02/18/2022 2305  ? AST 24 10/18/2014 1210  ? ALT 13 02/18/2022 2305  ? ALT 19 10/18/2014 1210  ? ALKPHOS 75 02/18/2022 2305  ? ALKPHOS 98 10/18/2014 1210  ? BILITOT 0.4 02/18/2022 2305  ? BILITOT 0.3 10/18/2014 1210  ? GFRNONAA >60 02/18/2022 2305  ? GFRNONAA >60 11/07/2014 0018  ? GFRNONAA >60 07/19/2014 1615  ? GFRAA >60 08/16/2020 0804  ? GFRAA >60 11/07/2014 0018  ? GFRAA >60 07/19/2014 1615  ? ? ?   ?Component Value Date/Time  ? WBC  4.1 02/18/2022 2305  ? RBC 2.95 (L) 02/18/2022 2305  ? HGB 9.5 (L) 02/18/2022 2305  ? HGB 10.6 (L) 11/07/2014 0018  ? HCT 29.1 (L) 02/18/2022 2305  ? HCT 24.6 (L) 05/23/2021 0945  ? PLT 95 (L) 02/18/2022 2305  ? PLT 323 11/07/2014 0018  ? MCV 98.6 02/18/2022 2305  ? MCV 82 11/07/2014 0018  ? MCH 32.2 02/18/2022 2305  ? MCHC 32.6 02/18/2022 2305  ? RDW 15.6 (H) 02/18/2022 2305  ? RDW 13.5 11/07/2014 0018  ? LYMPHSABS 0.4 (L) 02/18/2022 2305  ? LYMPHSABS 0.6 (L) 10/29/2013 0517  ? MONOABS 0.1 02/18/2022 2305  ? MONOABS 0.6 10/29/2013 0517  ? EOSABS 0.0 02/18/2022 2305  ? EOSABS 0.0 10/29/2013 0517  ? BASOSABS 0.0 02/18/2022 2305  ? BASOSABS 0.0 10/29/2013 0517  ? ?------------------------------- ? ?Imaging from last 24 hours (if applicable): ? ?Radiology interpretation: ?DG Chest 2 View ? ?Result Date: 02/18/2022 ?CLINICAL DATA:  Shortness of breath EXAM: CHEST - 2 VIEW COMPARISON:  12/30/2021 FINDINGS: Left Port-A-Cath remains in place, unchanged. Heart is normal size. No confluent airspace opacities or effusions. No acute bony abnormality. IMPRESSION: No active cardiopulmonary disease.  Electronically Signed   By: Rolm Baptise M.D.   On: 02/18/2022 23:35  ? ?CT HEAD WO CONTRAST (5MM) ? ?Result Date: 01/29/2022 ?CLINICAL DATA:  Dizziness and shakiness. EXAM: CT HEAD WITHOUT CONTRAST TECHNIQUE: Contiguous a

## 2022-02-19 NOTE — ED Notes (Signed)
RT at bedside.

## 2022-02-19 NOTE — ED Notes (Signed)
Pt ambulated to BR, one assist with this RN, steady gait, pt assisted back to bed, NAD noted, comfort measure given to pt, POC continues, safety in place. Pt on cardiac monitor. A&O x4 ?

## 2022-02-19 NOTE — ED Notes (Signed)
Pt return to treatment room 11 ?

## 2022-02-19 NOTE — Patient Instructions (Signed)
[facility] ?Discharge Instructions ? ?You were seen today by Tarri Abernethy PA-C for your abdominal pain.  As we discussed, your pain is most likely related to your underlying cancer, but there were not any major "alarm symptoms" at this time.  Please let us know if you have any new or worsening pain,  or are having any nausea, vomiting, diarrhea, constipation, fever, or chills along with your abdominal pain.  ? ?FOLLOW-UP APPOINTMENT: Follow up as scheduled with [name] in May 2023 ? ? ?Thank you for choosing [facility] at [facility] to provide your oncology and hematology care.  To afford each patient quality time with our provider, please arrive at least 15 minutes before your scheduled appointment time.  ? ?If you have a lab appointment with the [facility] please come in thru the Main Entrance and check in at the main information desk. ? ?You need to re-schedule your appointment should you arrive 10 or more minutes late.  We strive to give you quality time with our providers, and arriving late affects you and other patients whose appointments are after yours.  Also, if you no show three or more times for appointments you may be dismissed from the clinic at the providers discretion.     ?Again, thank you for choosing [facility].  Our hope is that these requests will decrease the amount of time that you wait before being seen by our physicians.       ?_____________________________________________________________ ? ?Should you have questions after your visit to [facility], please contact our office at [phone] and follow the prompts.  Our office hours are 8:00 a.m. and 4:30 p.m. Monday - Friday.  Please note that voicemails left after 4:00 p.m. may not be returned until the following business day.  We are closed weekends and major holidays.  You do have access to a nurse 24-7, just call the main number to  the clinic [phone] and do not press any options, hold on the line and a nurse will answer the phone.   ? ?For prescription refill requests, have your pharmacy contact our office and allow 72 hours.   ? ?Due to Covid, you will need to wear a mask upon entering the hospital. If you do not have a mask, a mask will be given to you at the Main Entrance upon arrival. For doctor visits, patients may have 1 support person age 31 or older with them. For treatment visits, patients can not have anyone with them due to social distancing guidelines and our immunocompromised population.  ? ? ? ?

## 2022-02-19 NOTE — ED Notes (Signed)
Patient transported to CT 

## 2022-02-20 LAB — CA 125: Cancer Antigen (CA) 125: 39.6 U/mL — ABNORMAL HIGH (ref 0.0–38.1)

## 2022-02-26 ENCOUNTER — Other Ambulatory Visit (HOSPITAL_COMMUNITY): Payer: Self-pay

## 2022-03-03 ENCOUNTER — Other Ambulatory Visit (HOSPITAL_COMMUNITY): Payer: Self-pay | Admitting: Hematology

## 2022-03-03 DIAGNOSIS — C569 Malignant neoplasm of unspecified ovary: Secondary | ICD-10-CM

## 2022-03-04 ENCOUNTER — Encounter (HOSPITAL_COMMUNITY): Payer: Self-pay | Admitting: Hematology

## 2022-03-04 ENCOUNTER — Other Ambulatory Visit (HOSPITAL_COMMUNITY): Payer: Self-pay

## 2022-03-10 ENCOUNTER — Encounter (HOSPITAL_COMMUNITY): Payer: Self-pay

## 2022-03-10 ENCOUNTER — Inpatient Hospital Stay (HOSPITAL_COMMUNITY): Payer: Medicare HMO

## 2022-03-10 ENCOUNTER — Emergency Department (HOSPITAL_COMMUNITY): Payer: Medicare HMO

## 2022-03-10 ENCOUNTER — Inpatient Hospital Stay (HOSPITAL_COMMUNITY)
Admission: EM | Admit: 2022-03-10 | Discharge: 2022-03-13 | DRG: 872 | Disposition: A | Discharge status: Home / Self Care | Payer: Medicare HMO | Attending: Family Medicine | Admitting: Family Medicine

## 2022-03-10 ENCOUNTER — Other Ambulatory Visit: Payer: Self-pay

## 2022-03-10 DIAGNOSIS — Z7951 Long term (current) use of inhaled steroids: Secondary | ICD-10-CM | POA: Diagnosis not present

## 2022-03-10 DIAGNOSIS — Z7982 Long term (current) use of aspirin: Secondary | ICD-10-CM | POA: Diagnosis not present

## 2022-03-10 DIAGNOSIS — E876 Hypokalemia: Secondary | ICD-10-CM | POA: Diagnosis present

## 2022-03-10 DIAGNOSIS — D696 Thrombocytopenia, unspecified: Secondary | ICD-10-CM | POA: Diagnosis present

## 2022-03-10 DIAGNOSIS — A419 Sepsis, unspecified organism: Secondary | ICD-10-CM | POA: Diagnosis present

## 2022-03-10 DIAGNOSIS — E869 Volume depletion, unspecified: Secondary | ICD-10-CM | POA: Diagnosis present

## 2022-03-10 DIAGNOSIS — E782 Mixed hyperlipidemia: Secondary | ICD-10-CM | POA: Diagnosis present

## 2022-03-10 DIAGNOSIS — A4151 Sepsis due to Escherichia coli [E. coli]: Secondary | ICD-10-CM | POA: Diagnosis present

## 2022-03-10 DIAGNOSIS — Z87891 Personal history of nicotine dependence: Secondary | ICD-10-CM | POA: Diagnosis not present

## 2022-03-10 DIAGNOSIS — J9611 Chronic respiratory failure with hypoxia: Secondary | ICD-10-CM | POA: Diagnosis present

## 2022-03-10 DIAGNOSIS — Z20822 Contact with and (suspected) exposure to covid-19: Secondary | ICD-10-CM | POA: Diagnosis present

## 2022-03-10 DIAGNOSIS — C786 Secondary malignant neoplasm of retroperitoneum and peritoneum: Secondary | ICD-10-CM | POA: Diagnosis present

## 2022-03-10 DIAGNOSIS — Z85028 Personal history of other malignant neoplasm of stomach: Secondary | ICD-10-CM

## 2022-03-10 DIAGNOSIS — N390 Urinary tract infection, site not specified: Secondary | ICD-10-CM | POA: Diagnosis present

## 2022-03-10 DIAGNOSIS — Z825 Family history of asthma and other chronic lower respiratory diseases: Secondary | ICD-10-CM

## 2022-03-10 DIAGNOSIS — R7881 Bacteremia: Secondary | ICD-10-CM | POA: Diagnosis not present

## 2022-03-10 DIAGNOSIS — Z820 Family history of epilepsy and other diseases of the nervous system: Secondary | ICD-10-CM

## 2022-03-10 DIAGNOSIS — E871 Hypo-osmolality and hyponatremia: Secondary | ICD-10-CM

## 2022-03-10 DIAGNOSIS — I10 Essential (primary) hypertension: Secondary | ICD-10-CM | POA: Diagnosis present

## 2022-03-10 DIAGNOSIS — C569 Malignant neoplasm of unspecified ovary: Secondary | ICD-10-CM | POA: Diagnosis present

## 2022-03-10 DIAGNOSIS — J449 Chronic obstructive pulmonary disease, unspecified: Secondary | ICD-10-CM | POA: Diagnosis present

## 2022-03-10 DIAGNOSIS — Z7952 Long term (current) use of systemic steroids: Secondary | ICD-10-CM

## 2022-03-10 DIAGNOSIS — B9620 Unspecified Escherichia coli [E. coli] as the cause of diseases classified elsewhere: Secondary | ICD-10-CM | POA: Diagnosis not present

## 2022-03-10 DIAGNOSIS — J9691 Respiratory failure, unspecified with hypoxia: Secondary | ICD-10-CM | POA: Diagnosis not present

## 2022-03-10 DIAGNOSIS — E872 Acidosis, unspecified: Secondary | ICD-10-CM | POA: Diagnosis not present

## 2022-03-10 DIAGNOSIS — Z79899 Other long term (current) drug therapy: Secondary | ICD-10-CM

## 2022-03-10 DIAGNOSIS — I739 Peripheral vascular disease, unspecified: Secondary | ICD-10-CM | POA: Diagnosis present

## 2022-03-10 DIAGNOSIS — Z8543 Personal history of malignant neoplasm of ovary: Secondary | ICD-10-CM

## 2022-03-10 DIAGNOSIS — Z9981 Dependence on supplemental oxygen: Secondary | ICD-10-CM | POA: Diagnosis not present

## 2022-03-10 DIAGNOSIS — Z8249 Family history of ischemic heart disease and other diseases of the circulatory system: Secondary | ICD-10-CM

## 2022-03-10 DIAGNOSIS — Z9221 Personal history of antineoplastic chemotherapy: Secondary | ICD-10-CM | POA: Diagnosis not present

## 2022-03-10 LAB — BLOOD GAS, ARTERIAL
Acid-Base Excess: 7.8 mmol/L — ABNORMAL HIGH (ref 0.0–2.0)
Bicarbonate: 33.3 mmol/L — ABNORMAL HIGH (ref 20.0–28.0)
Drawn by: 22223
FIO2: 36 %
O2 Saturation: 93.4 %
Patient temperature: 37
pCO2 arterial: 49 mmHg — ABNORMAL HIGH (ref 32–48)
pH, Arterial: 7.44 (ref 7.35–7.45)
pO2, Arterial: 58 mmHg — ABNORMAL LOW (ref 83–108)

## 2022-03-10 LAB — URINALYSIS, ROUTINE W REFLEX MICROSCOPIC
Bacteria, UA: NONE SEEN
Bilirubin Urine: NEGATIVE
Glucose, UA: NEGATIVE mg/dL
Ketones, ur: NEGATIVE mg/dL
Nitrite: NEGATIVE
Protein, ur: NEGATIVE mg/dL
Specific Gravity, Urine: 1.031 — ABNORMAL HIGH (ref 1.005–1.030)
WBC, UA: >50 WBC/hpf — ABNORMAL HIGH (ref 0–5)
pH: 6 (ref 5.0–8.0)

## 2022-03-10 LAB — COMPREHENSIVE METABOLIC PANEL
ALT: 11 U/L (ref 0–44)
AST: 19 U/L (ref 15–41)
Albumin: 3.2 g/dL — ABNORMAL LOW (ref 3.5–5.0)
Alkaline Phosphatase: 67 U/L (ref 38–126)
Anion gap: 7 (ref 5–15)
BUN: 12 mg/dL (ref 8–23)
CO2: 33 mmol/L — ABNORMAL HIGH (ref 22–32)
Calcium: 8 mg/dL — ABNORMAL LOW (ref 8.9–10.3)
Chloride: 93 mmol/L — ABNORMAL LOW (ref 98–111)
Creatinine, Ser: 0.7 mg/dL (ref 0.44–1.00)
GFR, Estimated: >60 mL/min (ref >60)
Glucose, Bld: 103 mg/dL — ABNORMAL HIGH (ref 70–99)
Potassium: 3.6 mmol/L (ref 3.5–5.1)
Sodium: 133 mmol/L — ABNORMAL LOW (ref 135–145)
Total Bilirubin: 0.5 mg/dL (ref 0.3–1.2)
Total Protein: 6 g/dL — ABNORMAL LOW (ref 6.5–8.1)

## 2022-03-10 LAB — CBC WITH DIFFERENTIAL/PLATELET
Abs Immature Granulocytes: 0.05 10*3/uL (ref 0.00–0.07)
Basophils Absolute: 0.1 10*3/uL (ref 0.0–0.1)
Basophils Relative: 1 %
Eosinophils Absolute: 0.1 10*3/uL (ref 0.0–0.5)
Eosinophils Relative: 1 %
HCT: 24.4 % — ABNORMAL LOW (ref 36.0–46.0)
Hemoglobin: 8 g/dL — ABNORMAL LOW (ref 12.0–15.0)
Immature Granulocytes: 1 %
Lymphocytes Relative: 10 %
Lymphs Abs: 0.7 10*3/uL (ref 0.7–4.0)
MCH: 32.9 pg (ref 26.0–34.0)
MCHC: 32.8 g/dL (ref 30.0–36.0)
MCV: 100.4 fL — ABNORMAL HIGH (ref 80.0–100.0)
Monocytes Absolute: 0.6 10*3/uL (ref 0.1–1.0)
Monocytes Relative: 9 %
Neutro Abs: 4.9 10*3/uL (ref 1.7–7.7)
Neutrophils Relative %: 78 %
Platelets: 67 10*3/uL — ABNORMAL LOW (ref 150–400)
RBC: 2.43 MIL/uL — ABNORMAL LOW (ref 3.87–5.11)
RDW: 16.9 % — ABNORMAL HIGH (ref 11.5–15.5)
WBC: 6.4 10*3/uL (ref 4.0–10.5)
nRBC: 0 % (ref 0.0–0.2)

## 2022-03-10 LAB — PROTIME-INR
INR: 1 (ref 0.8–1.2)
Prothrombin Time: 13.2 seconds (ref 11.4–15.2)

## 2022-03-10 LAB — LACTIC ACID, PLASMA: Lactic Acid, Venous: 0.8 mmol/L (ref 0.5–1.9)

## 2022-03-10 LAB — RESP PANEL BY RT-PCR (FLU A&B, COVID) ARPGX2
Influenza A by PCR: NEGATIVE
Influenza B by PCR: NEGATIVE
SARS Coronavirus 2 by RT PCR: NEGATIVE

## 2022-03-10 LAB — APTT: aPTT: 46 seconds — ABNORMAL HIGH (ref 24–36)

## 2022-03-10 MED ORDER — ONDANSETRON HCL 4 MG/2ML IJ SOLN: 4.0000 mg | Freq: Four times a day (QID) | INTRAMUSCULAR | Status: DC | PRN

## 2022-03-10 MED ORDER — SODIUM CHLORIDE 0.9 % IV BOLUS
1000.0000 mL | Freq: Once | INTRAVENOUS | Status: AC
  Administered 2022-03-10: 1000 mL via INTRAVENOUS

## 2022-03-10 MED ORDER — ONDANSETRON HCL 4 MG PO TABS: 4.0000 mg | ORAL_TABLET | Freq: Four times a day (QID) | ORAL | Status: DC | PRN

## 2022-03-10 MED ORDER — IPRATROPIUM-ALBUTEROL 0.5-2.5 (3) MG/3ML IN SOLN
3.0000 mL | Freq: Four times a day (QID) | RESPIRATORY_TRACT | Status: DC
  Administered 2022-03-10 – 2022-03-11 (×5): 3 mL via RESPIRATORY_TRACT
  Filled 2022-03-10 (×6): qty 3

## 2022-03-10 MED ORDER — CEFTRIAXONE SODIUM 2 G IJ SOLR
2.0000 g | INTRAMUSCULAR | Status: DC
  Administered 2022-03-10 – 2022-03-12 (×3): 2 g via INTRAVENOUS
  Filled 2022-03-10 (×3): qty 20

## 2022-03-10 MED ORDER — SODIUM CHLORIDE 0.9 % IV SOLN
2.0000 g | Freq: Once | INTRAVENOUS | Status: AC
  Administered 2022-03-10: 2 g via INTRAVENOUS
  Filled 2022-03-10: qty 12.5

## 2022-03-10 MED ORDER — ENOXAPARIN SODIUM 40 MG/0.4ML IJ SOSY
40.0000 mg | PREFILLED_SYRINGE | INTRAMUSCULAR | Status: DC
  Filled 2022-03-10: qty 0.4

## 2022-03-10 MED ORDER — BUDESONIDE 0.5 MG/2ML IN SUSP
0.5000 mg | Freq: Two times a day (BID) | RESPIRATORY_TRACT | Status: DC
Start: 2022-03-10 — End: 2022-03-13
  Administered 2022-03-10 – 2022-03-13 (×6): 0.5 mg via RESPIRATORY_TRACT
  Filled 2022-03-10 (×6): qty 2

## 2022-03-10 MED ORDER — LACTATED RINGERS IV SOLN: INTRAVENOUS | Status: DC

## 2022-03-10 MED ORDER — ACETAMINOPHEN 650 MG RE SUPP: 650.0000 mg | Freq: Four times a day (QID) | RECTAL | Status: DC | PRN

## 2022-03-10 MED ORDER — IBUPROFEN 800 MG PO TABS
800.0000 mg | ORAL_TABLET | Freq: Once | ORAL | Status: AC
  Administered 2022-03-10: 800 mg via ORAL
  Filled 2022-03-10: qty 1

## 2022-03-10 MED ORDER — ACETAMINOPHEN 325 MG PO TABS
650.0000 mg | ORAL_TABLET | Freq: Four times a day (QID) | ORAL | Status: DC | PRN
  Administered 2022-03-10 – 2022-03-11 (×3): 650 mg via ORAL
  Filled 2022-03-10 (×3): qty 2

## 2022-03-10 MED ORDER — IOHEXOL 300 MG/ML  SOLN
100.0000 mL | Freq: Once | INTRAMUSCULAR | Status: AC | PRN
  Administered 2022-03-10: 100 mL via INTRAVENOUS

## 2022-03-10 NOTE — Assessment & Plan Note (Addendum)
Restart niraparib 200 mg daily ?-CT abdomen shows stable omental carcinomatosis ?Follow-up with [name] ?

## 2022-03-10 NOTE — Assessment & Plan Note (Addendum)
Initially Holding amlodipine secondary to soft blood pressures ?

## 2022-03-10 NOTE — Assessment & Plan Note (Signed)
Stable presently without any wheezing ?Continue bronchodilators ?

## 2022-03-10 NOTE — Assessment & Plan Note (Addendum)
Source is UTI and bacteremia ?-- Initially given cefepime in the ED ?continue ceftriaxone 2 grams daily pending urine culture data ?blood cultures--E coli ?Chest x-ray without any infiltrates ?Lactic acid 0.8 ?Continue IV fluids ?

## 2022-03-10 NOTE — Assessment & Plan Note (Signed)
Stable on 3 L nasal cannula ?No increased work of breathing ?

## 2022-03-10 NOTE — Assessment & Plan Note (Addendum)
Due to volume depletion ?Continue IVF>>improving ?

## 2022-03-10 NOTE — Progress Notes (Signed)
?   03/10/22 2003  ?Assess: MEWS Score  ?Temp 99.2 ?F (37.3 ?C)  ?BP (!) 160/81  ?Pulse Rate (!) 132  ?Resp (!) 35  ?SpO2 100 %  ?Assess: MEWS Score  ?MEWS Temp 0  ?MEWS Systolic 0  ?MEWS Pulse 3  ?MEWS RR 2  ?MEWS LOC 0  ?MEWS Score 5  ?MEWS Score Color Red  ?Assess: if the MEWS score is Yellow or Red  ?Were vital signs taken at a resting state? Yes  ?Focused Assessment Change from prior assessment (see assessment flowsheet)  ?MEWS guidelines implemented *See Row Information* Yes  ?Treat  ?MEWS Interventions Consulted Respiratory Therapy ?(notified chawrge nurse and MD)  ?Pain Scale 0-10  ?Pain Score 0  ?Take Vital Signs  ?Increase Vital Sign Frequency  Red: Q 1hr X 4 then Q 4hr X 4, if remains red, continue Q 4hrs  ?Escalate  ?MEWS: Escalate Red: discuss with charge nurse/RN and provider, consider discussing with RRT  ?Notify: Charge Nurse/RN  ?Name of Charge Nurse/RN Notified Deeann Dowse, RN  ?Date Charge Nurse/RN Notified 03/10/22  ?Time Charge Nurse/RN Notified 2004  ? ? ?

## 2022-03-10 NOTE — ED Notes (Signed)
Patient transported to CT 

## 2022-03-10 NOTE — Assessment & Plan Note (Addendum)
Chronic ?Acute drop due to sepsis ?Monitor for signs of bleed ?SCDs ?

## 2022-03-10 NOTE — ED Provider Notes (Signed)
?[name] ?Provider Note ? ? ?CSN: [id number] ?Arrival date & time: 03/10/22  7425 ? ?  ? ?History ? ?Chief Complaint  ?Patient presents with  ? CA Pt  ? Fever  ? Shortness of Breath  ? ? ?[name] is a 66 y.o. female. ? ? ?Shortness of Breath ? ?This patient is a 66 year old female who has a known history of COPD on 3 L of oxygen as well as breathing treatments daily, she also has a history of ovarian cancer with peritoneal metastatic disease.  She is currently taking oral chemotherapy in the form of Niraparib 200 mg daily, last seen by the oncologist on February 19, 2022.  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  Review of the medical record shows that the last CT scan that she had was about a month and a half ago that showed stable omental carcinomatosis without new or progressive disease. ? ?The patient was also seen in the [facility] system, had an office visit with pulmonology on April 17 approximately 1 week ago during which time she was complaining of chronic dyspnea, being on oxygen, severe COPD and a chronic cough.  She noted that she had had some improving in her breathing over time at that visit.  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 ?She presents to the hospital today with increasing fever which started last night, 103.3 at its highest, she has had some increasing slight shortness of breath but overall has just felt febrile.  She has noted some left-sided abdominal discomfort since yesterday as well but denies any diarrhea constipation blood in the stools or any urinary symptoms.  She is not nauseated or vomiting, denies any increasing or coughing or increasing in oxygen need however she did measure an oxygen of around 88% this morning. ? ?Home Medications ?Prior to Admission medications    ?Medication Sig Start Date End Date Taking? Authorizing Provider  ?Acetylcysteine (MUCOMYST-10 IN) Inhale into the lungs.    [provider]  ?albuterol (VENTOLIN HFA) 108 (90 Base) MCG/ACT inhaler Inhale 2 puffs into the lungs every 6 (six) hours as needed for wheezing or shortness of breath.    [provider]  ?amLODipine (NORVASC) 5 MG tablet Take 5 mg by mouth at bedtime.     [provider]  ?aspirin EC 81 MG tablet Take 81 mg by mouth daily.    [provider]  ?atorvastatin (LIPITOR) 40 MG tablet Take 1 tablet (40 mg total) by mouth daily. 01/02/22   Dessa Phi, DO  ?ferrous sulfate 325 (65 FE) MG tablet Take 325 mg by mouth every other day.    [provider]  ?Fluticasone-Umeclidin-Vilant (TRELEGY ELLIPTA) 100-62.5-25 MCG/ACT AEPB Inhale into the lungs. 06/18/21   [provider]  ?gabapentin (NEURONTIN) 600 MG tablet Take 600 mg by mouth 2 (two) times daily. 02/02/21   [provider]  ?ipratropium (ATROVENT HFA) 17 MCG/ACT inhaler Inhale 2 puffs into the lungs every 6 (six) hours.    [provider]  ?levalbuterol Penne Lash) 1.25 MG/3ML nebulizer solution Inhale into the lungs. 09/28/21   [provider]  ?magnesium oxide (MAG-OX) 400 (240 Mg) MG tablet Take 1 tablet by mouth twice daily 03/04/22   Derek Jack, MD  ?niraparib tosylate (ZEJULA) 100 MG capsule Take 2 capsules (200 mg total) by mouth  daily. May take at bedtime to reduce nausea and vomiting. 05/23/21   Heath Lark, MD  ?nortriptyline (PAMELOR) 25 MG capsule Take 25 mg by mouth at bedtime.    [provider]  ?Omeprazole-Sodium Bicarbonate (ZEGERID) 20-1100 MG CAPS capsule Take by mouth.    [provider]  ?OXYGEN Inhale 3 L into the lungs continuous.     [provider]  ?predniSONE (DELTASONE) 10 MG tablet Take 4 tabs for 3 days, then 3 tabs for 3 days, then 2 tabs for 3 days, then 1 tab for 3 days, then 1/2 tab daily. 01/01/22    Dessa Phi, DO  ?predniSONE (DELTASONE) 5 MG tablet Take 5 mg by mouth daily with breakfast.    [provider]  ?primidone (MYSOLINE) 50 MG tablet Take 25 mg at night for a week then increase to 50 mg at night and continue that dose 01/30/22   [provider]  ?   ? ?Allergies    ?Patient has no known allergies.   ? ?Review of Systems   ?Review of Systems  ?Respiratory:  Positive for shortness of breath.   ?All other systems reviewed and are negative. ? ?Physical Exam ?Updated Vital Signs ?BP 98/62   Pulse (!) 104   Temp 99 ?F (37.2 ?C) (Oral)   Resp (!) 24   Ht 1.473 m ('4\' 10"'$ )   Wt 56.7 kg   SpO2 99%   BMI 26.13 kg/m?  ?Physical Exam ?Vitals and nursing note reviewed.  ?Constitutional:   ?   General: She is not in acute distress. ?   Appearance: She is well-developed.  ?HENT:  ?   Head: Normocephalic and atraumatic.  ?   Mouth/Throat:  ?   Pharynx: No oropharyngeal exudate.  ?Eyes:  ?   General: No scleral icterus.    ?   Right eye: No discharge.     ?   Left eye: No discharge.  ?   Conjunctiva/sclera: Conjunctivae normal.  ?   Pupils: Pupils are equal, round, and reactive to light.  ?Neck:  ?   Thyroid: No thyromegaly.  ?   Vascular: No JVD.  ?Cardiovascular:  ?   Rate and Rhythm: Regular rhythm. Tachycardia present.  ?   Heart sounds: Normal heart sounds. No murmur heard. ?  No friction rub. No gallop.  ?   Comments: Heart rate of 116 bpm, good pulses, no edema ?Pulmonary:  ?   Effort: Pulmonary effort is normal. No respiratory distress.  ?   Breath sounds: Normal breath sounds. No wheezing or rales.  ?   Comments: Oxygen of 95 to 96% on 3 L by nasal cannula, there is some rales in the bases bilaterally, seem to clear with deep breathing ?Abdominal:  ?   General: Bowel sounds are normal. There is no distension.  ?   Palpations: Abdomen is soft. There is no mass.  ?   Tenderness: There is abdominal tenderness.  ?   Comments: Tenderness present in the left mid to lower abdomen, no  peritoneal signs, no right-sided tenderness  ?Musculoskeletal:     ?   General: No tenderness. Normal range of motion.  ?   Cervical back: Normal range of motion and neck supple.  ?   Right lower leg: No edema.  ?   Left lower leg: No edema.  ?Lymphadenopathy:  ?   Cervical: No cervical adenopathy.  ?Skin: ?   General: Skin is warm and dry.  ?   Findings: No  erythema or rash.  ?Neurological:  ?   General: No focal deficit present.  ?   Mental Status: She is alert.  ?   Coordination: Coordination normal.  ?Psychiatric:     ?   Behavior: Behavior normal.  ? ? ?ED Results / Procedures / Treatments   ?Labs ?(all labs ordered are listed, but only abnormal results are displayed) ?Labs Reviewed  ?COMPREHENSIVE METABOLIC PANEL - Abnormal; Notable for the following components:  ?    Result Value  ? Sodium 133 (*)   ? Chloride 93 (*)   ? CO2 33 (*)   ? Glucose, Bld 103 (*)   ? Calcium 8.0 (*)   ? Total Protein 6.0 (*)   ? Albumin 3.2 (*)   ? All other components within normal limits  ?CBC WITH DIFFERENTIAL/PLATELET - Abnormal; Notable for the following components:  ? RBC 2.43 (*)   ? Hemoglobin 8.0 (*)   ? HCT 24.4 (*)   ? MCV 100.4 (*)   ? RDW 16.9 (*)   ? Platelets 67 (*)   ? All other components within normal limits  ?APTT - Abnormal; Notable for the following components:  ? aPTT 46 (*)   ? All other components within normal limits  ?URINALYSIS, ROUTINE W REFLEX MICROSCOPIC - Abnormal; Notable for the following components:  ? Color, Urine STRAW (*)   ? APPearance HAZY (*)   ? Specific Gravity, Urine 1.031 (*)   ? Hgb urine dipstick SMALL (*)   ? Leukocytes,Ua LARGE (*)   ? WBC, UA >50 (*)   ? All other components within normal limits  ?RESP PANEL BY RT-PCR (FLU A&B, COVID) [id number]  ?CULTURE, BLOOD (ROUTINE X 2)  ?CULTURE, BLOOD (ROUTINE X 2)  ?URINE CULTURE  ?LACTIC ACID, PLASMA  ?PROTIME-INR  ? ? ?EKG ?EKG Interpretation ? ?Date/Time:  Sunday March 10 2022 10:06:30 EDT ?Ventricular Rate:  116 ?PR Interval:  164 ?QRS  Duration: 84 ?QT Interval:  304 ?QTC Calculation: 423 ?R Axis:   95 ?Text Interpretation: Sinus tachycardia Consider RVH w/ secondary repol abnormality since last tracing no significant change Confirmed by Noemi Chapel

## 2022-03-10 NOTE — H&P (Addendum)
History and Physical    Patient: [name] ZOX:[id number] DOB: 1955/12/16 DOA: 03/10/2022 DOS: the patient was seen and examined on 03/10/2022 PCP: Abram Sander, MD  Patient coming from: Home  Chief Complaint:  Chief Complaint  Patient presents with   CA Pt   Fever   Shortness of Breath   HPI: [name] is 66 year old female with a history of ovarian carcinoma with omental carcinomatosis currently on niraparib, COPD, chronic respiratory failure on 3 L, hypertension, hyperlipidemia, and tremor presenting with fevers, chills, left-sided abdominal pain and generalized weakness that started on 03/08/2022.  The patient states that her fever went up to 103.3 F on the morning of admission.  She did have some dysuria and urinary frequency that started on 03/08/2022.  She states that she does have a little more dyspnea on exertion than usual.  She has chronic shortness of breath.  She has a cough with white sputum.  Her cough has been chronic.  She denies any hemoptysis.  She has not had any nausea, vomiting, diarrhea.  Her abdominal pain also began on 03/08/2022 that was left-sided that has been off and on.  There is no hematochezia or melena.  She denies any NSAIDs or any new medications.  Because of her fever, shortness of breath, and progressive generalized weakness, the patient presented for further evaluation. In the ED, the patient had low-grade temperature of 99.6 F.  She was tachycardic into the 120s.  She had soft blood pressures with systolic BP in the 90s.  Oxygen saturation 100% on 3 L.  WBC 6.4, hemoglobin 8.0, platelets 67,000 sodium 133, potassium 3.6, bicarbonate 33, creatinine 0.70.  CT abdomen and pelvis showed a stable 5 mm right lobe liver lesion and Persistent omental fat stranding and subtle nodularity in the right and central anterior abdomen, adjacent to the transverse colon, thought to represent omental carcinomatosis.  UA showed >50 WBC.  Lactic acid 0.8.  The patient was  given the appropriate IV fluid and started on cefepime.   Review of Systems: As mentioned in the history of present illness. All other systems reviewed and are negative. Past Medical History:  Diagnosis Date   Anemia    Aortic atherosclerosis (HCC)    Arthritis    Asthma    Bilateral carotid artery stenosis    Cancer (HCC)    Gastric Cancer   COPD (chronic obstructive pulmonary disease) (HCC)    High cholesterol    Hyperlipidemia    Hypertension    Neuropathy    Osteoarthritis    Ovarian cancer (HCC)    Oxygen dependent    Peripheral vascular disease (HCC)    Peritoneal carcinoma (HCC)    Pneumonia 2019   Port-A-Cath in place 07/03/2020   Pulmonary emphysema (HCC)    Past Surgical History:  Procedure Laterality Date   ESOPHAGOGASTRODUODENOSCOPY (EGD) WITH PROPOFOL N/A 02/17/2021   Procedure: ESOPHAGOGASTRODUODENOSCOPY (EGD) WITH PROPOFOL;  Surgeon: Dolores Frame, MD;  [facility];  Service: Gastroenterology;  Laterality: N/A;   HALLUX VALGUS BASE WEDGE Right 06/09/2015   Procedure: Base wedge osteotomy with modified McBride right foot ;  Surgeon: Linus Galas, MD;  Location: [facility];  Service: Podiatry;  Laterality: Right;   PORTACATH PLACEMENT Left 06/28/2020   Procedure: PORT-A-CATHETER PLACEMENT LEFT CHEST (attached catheter in left subclavian);  Surgeon: Lucretia Roers, MD;  Location: [facility];  Service: General;  Laterality: Left;   TUBAL LIGATION     VIDEO BRONCHOSCOPY WITH ENDOBRONCHIAL NAVIGATION  N/A 03/09/2021   Procedure: VIDEO BRONCHOSCOPY WITH ENDOBRONCHIAL NAVIGATION;  Surgeon: Vida Rigger, MD;  Location: [facility];  Service: Thoracic;  Laterality: N/A;   VIDEO BRONCHOSCOPY WITH ENDOBRONCHIAL ULTRASOUND N/A 03/09/2021   Procedure: VIDEO BRONCHOSCOPY WITH ENDOBRONCHIAL ULTRASOUND;  Surgeon: Vida Rigger, MD;  Location: [facility];  Service: Thoracic;  Laterality: N/A;   Social History:  reports that she quit smoking about 8 years ago. Her  smoking use included cigarettes. She has a 60.00 pack-year smoking history. She has never used smokeless tobacco. She reports that she does not drink alcohol and does not use drugs.  No Known Allergies  Family History  Problem Relation Age of Onset   Alzheimer's disease Mother    COPD Father    Emphysema Father    Hypertension Father    Healthy Sister    Healthy Brother    Alzheimer's disease Maternal Grandmother    Healthy Sister    Healthy Sister    Healthy Sister    Prostate cancer Other        paternal grandmother's brother; dx in early 33s   Breast cancer Neg Hx     Prior to Admission medications   Medication Sig Start Date End Date Taking? Authorizing Provider  Acetylcysteine (MUCOMYST-10 IN) Inhale into the lungs.    [provider]  albuterol (VENTOLIN HFA) 108 (90 Base) MCG/ACT inhaler Inhale 2 puffs into the lungs every 6 (six) hours as needed for wheezing or shortness of breath.    [provider]  amLODipine (NORVASC) 5 MG tablet Take 5 mg by mouth at bedtime.     [provider]  aspirin EC 81 MG tablet Take 81 mg by mouth daily.    [provider]  atorvastatin (LIPITOR) 40 MG tablet Take 1 tablet (40 mg total) by mouth daily. 01/02/22   Noralee Stain, DO  ferrous sulfate 325 (65 FE) MG tablet Take 325 mg by mouth every other day.    [provider]  Fluticasone-Umeclidin-Vilant (TRELEGY ELLIPTA) 100-62.5-25 MCG/ACT AEPB Inhale into the lungs. 06/18/21   [provider]  gabapentin (NEURONTIN) 600 MG tablet Take 600 mg by mouth 2 (two) times daily. 02/02/21   [provider]  ipratropium (ATROVENT HFA) 17 MCG/ACT inhaler Inhale 2 puffs into the lungs every 6 (six) hours.    [provider]  levalbuterol Pauline Aus) 1.25 MG/3ML nebulizer solution Inhale into the lungs. 09/28/21   [provider]  magnesium oxide (MAG-OX) 400 (240 Mg) MG tablet Take 1 tablet by mouth twice daily 03/04/22    Doreatha Massed, MD  niraparib tosylate (ZEJULA) 100 MG capsule Take 2 capsules (200 mg total) by mouth daily. May take at bedtime to reduce nausea and vomiting. 05/23/21   Artis Delay, MD  nortriptyline (PAMELOR) 25 MG capsule Take 25 mg by mouth at bedtime.    [provider]  Omeprazole-Sodium Bicarbonate (ZEGERID) 20-1100 MG CAPS capsule Take by mouth.    [provider]  OXYGEN Inhale 3 L into the lungs continuous.     [provider]  predniSONE (DELTASONE) 10 MG tablet Take 4 tabs for 3 days, then 3 tabs for 3 days, then 2 tabs for 3 days, then 1 tab for 3 days, then 1/2 tab daily. 01/01/22   Noralee Stain, DO  predniSONE (DELTASONE) 5 MG tablet Take 5 mg by mouth daily with breakfast.    [provider]  primidone (MYSOLINE) 50 MG tablet Take 25 mg at night for a week then  increase to 50 mg at night and continue that dose 01/30/22   [provider]    Physical Exam: Vitals:   03/10/22 1205 03/10/22 1243 03/10/22 1330 03/10/22 1400  BP: 102/61 98/62 (!) 105/59 98/64  Pulse: (!) 103 (!) 104 96 93  Resp: (!) 21 (!) 24 18   Temp:      TempSrc:      SpO2: 98% 99% 99% 100%  Weight:      Height:       GENERAL:  A&O x 3, NAD, well developed, cooperative, follows commands HEENT: [place]/AT, No thrush, No icterus, No oral ulcers Neck:  No neck mass, No meningismus, soft, supple CV: RRR, no S3, no S4, no rub, no JVD Lungs:  diminished BS.  No wheezing.  Bibasilar rales Abd: soft/NT +BS, nondistended Ext: No edema, no lymphangitis, no cyanosis, no rashes Neuro:  CN II-XII intact, strength 4/5 in RUE, RLE, strength 4/5 LUE, LLE; sensation intact bilateral; no dysmetria; babinski equivocal  Data Reviewed: {Data reviewed above in history  Assessment and Plan: * Sepsis due to undetermined organism [facility]) Source is likely urine -- Initially given cefepime in the ED Start ceftriaxone pending urine culture data Follow blood cultures Chest x-ray  without any infiltrates Lactic acid 0.8 Continue IV fluids  Thrombocytopenia (HCC) Chronic Acute drop due to sepsis Monitor for signs of bleed  Hyponatremia Due to volume depletion Continue IVF  Ovarian cancer (HCC) Continue niraparib 200 mg daily -CT abdomen shows stable omental carcinomatosis Follow-up with Dr. Ellin Saba  Mixed hyperlipidemia Continue statin  Chronic respiratory failure with hypoxia (HCC) Stable on 3 L nasal cannula No increased work of breathing  Hypertension Holding amlodipine secondary to soft blood pressures  COPD (chronic obstructive pulmonary disease) (HCC) Stable presently without any wheezing Continue bronchodilators      Advance Care Planning: FULL  Consults: none  Family Communication: daughter updated 4/23  Severity of Illness: The appropriate patient status for this patient is INPATIENT. Inpatient status is judged to be reasonable and necessary in order to provide the required intensity of service to ensure the patient's safety. The patient's presenting symptoms, physical exam findings, and initial radiographic and laboratory data in the context of their chronic comorbidities is felt to place them at high risk for further clinical deterioration. Furthermore, it is not anticipated that the patient will be medically stable for discharge from the hospital within 2 midnights of admission.   * I certify that at the point of admission it is my clinical judgment that the patient will require inpatient hospital care spanning beyond 2 midnights from the point of admission due to high intensity of service, high risk for further deterioration and high frequency of surveillance required.*  Author: Catarina Hartshorn, MD 03/10/2022 2:01 PM  For on call review [email or web address].

## 2022-03-10 NOTE — ED Notes (Signed)
Pt continues to rest comfortably.  Cardiac leads and pulse ox repositioned.  Pt denies needs.  ?

## 2022-03-10 NOTE — Sepsis Progress Note (Signed)
Sepsis protocol is being followed by [facility]. 

## 2022-03-10 NOTE — Assessment & Plan Note (Signed)
Continue statin. 

## 2022-03-10 NOTE — ED Notes (Signed)
Lab notified of add on labs.

## 2022-03-10 NOTE — ED Triage Notes (Signed)
Pt c/o fever, intermittent LLQ pain, and increasing SOB x 1 days.  Currently denies pain.  Denies N/V/D.  Pt reports taking Tylenol around 0800.  Highest home temp 103.3.  Currently afebrile.    ? ?Pt reports having to increase O2 from 3L [place] to 4L [place].  ? ?Pt is currently taking oral chemo.     ?

## 2022-03-10 NOTE — ED Notes (Signed)
2nd Lactic discontinued per Ivin Booty w/ [facility].  ?

## 2022-03-10 NOTE — ED Notes (Addendum)
Delay in fluid and antibiotics d/t Pt needing to urgently use the restroom and requesting RN to wait to start fluids/antibiotics.   ? ?Pt's oxygen noted to drop into mid-80s after ambulating to the restroom.  Pt wore 3L [place] during ambulation.  ?

## 2022-03-10 NOTE — Hospital Course (Signed)
66 year old female with a history of ovarian carcinoma with omental carcinomatosis currently on niraparib, COPD, chronic respiratory failure on 3 L, hypertension, hyperlipidemia, and tremor presenting with fevers, chills, left-sided abdominal pain and generalized weakness that started on 03/08/2022.  The patient states that her fever went up to 103.3 ?F on the morning of admission.  She did have some dysuria and urinary frequency that started on 03/08/2022.  She states that she does have a little more dyspnea on exertion than usual.  She has chronic shortness of breath.  She has a cough with white sputum.  Her cough has been chronic.  She denies any hemoptysis.  She has not had any nausea, vomiting, diarrhea.  Her abdominal pain also began on 03/08/2022 that was left-sided that has been off and on.  There is no hematochezia or melena.  She denies any NSAIDs or any new medications.  Because of her fever, shortness of breath, and progressive generalized weakness, the patient presented for further evaluation. ?In the ED, the patient had low-grade temperature of 99.6 ?F.  She was tachycardic into the 120s.  She had soft blood pressures with systolic BP in the 47M.  Oxygen saturation 100% on 3 L.  WBC 6.4, hemoglobin 8.0, platelets 67,000 sodium 133, potassium 3.6, bicarbonate 33, creatinine 0.70.  CT abdomen and pelvis showed a stable 5 mm right lobe liver lesion and Persistent omental fat stranding and subtle nodularity in the right and central anterior abdomen, adjacent to the transverse colon, thought to represent omental carcinomatosis.  UA showed >50 WBC.  Lactic acid 0.8.  The patient was given the appropriate IV fluid and started on cefepime. ?

## 2022-03-11 DIAGNOSIS — R7881 Bacteremia: Secondary | ICD-10-CM | POA: Diagnosis not present

## 2022-03-11 DIAGNOSIS — J9611 Chronic respiratory failure with hypoxia: Secondary | ICD-10-CM | POA: Diagnosis not present

## 2022-03-11 DIAGNOSIS — C569 Malignant neoplasm of unspecified ovary: Secondary | ICD-10-CM

## 2022-03-11 DIAGNOSIS — I10 Essential (primary) hypertension: Secondary | ICD-10-CM

## 2022-03-11 DIAGNOSIS — N390 Urinary tract infection, site not specified: Secondary | ICD-10-CM

## 2022-03-11 DIAGNOSIS — E876 Hypokalemia: Secondary | ICD-10-CM | POA: Diagnosis not present

## 2022-03-11 DIAGNOSIS — J449 Chronic obstructive pulmonary disease, unspecified: Secondary | ICD-10-CM

## 2022-03-11 DIAGNOSIS — E872 Acidosis, unspecified: Secondary | ICD-10-CM

## 2022-03-11 DIAGNOSIS — J9691 Respiratory failure, unspecified with hypoxia: Secondary | ICD-10-CM

## 2022-03-11 DIAGNOSIS — B9620 Unspecified Escherichia coli [E. coli] as the cause of diseases classified elsewhere: Secondary | ICD-10-CM

## 2022-03-11 DIAGNOSIS — A419 Sepsis, unspecified organism: Secondary | ICD-10-CM | POA: Diagnosis not present

## 2022-03-11 LAB — BLOOD CULTURE ID PANEL (REFLEXED) - BCID2

## 2022-03-11 LAB — BASIC METABOLIC PANEL
Anion gap: 7 (ref 5–15)
BUN: 10 mg/dL (ref 8–23)
CO2: 31 mmol/L (ref 22–32)
Calcium: 7.9 mg/dL — ABNORMAL LOW (ref 8.9–10.3)
Chloride: 96 mmol/L — ABNORMAL LOW (ref 98–111)
Creatinine, Ser: 0.56 mg/dL (ref 0.44–1.00)
GFR, Estimated: >60 mL/min (ref >60)
Glucose, Bld: 108 mg/dL — ABNORMAL HIGH (ref 70–99)
Potassium: 3.3 mmol/L — ABNORMAL LOW (ref 3.5–5.1)
Sodium: 134 mmol/L — ABNORMAL LOW (ref 135–145)

## 2022-03-11 LAB — CBC
HCT: 22.8 % — ABNORMAL LOW (ref 36.0–46.0)
Hemoglobin: 7.2 g/dL — ABNORMAL LOW (ref 12.0–15.0)
MCH: 31.6 pg (ref 26.0–34.0)
MCHC: 31.6 g/dL (ref 30.0–36.0)
MCV: 100 fL (ref 80.0–100.0)
Platelets: 49 10*3/uL — ABNORMAL LOW (ref 150–400)
RBC: 2.28 MIL/uL — ABNORMAL LOW (ref 3.87–5.11)
RDW: 17.2 % — ABNORMAL HIGH (ref 11.5–15.5)
WBC: 5.4 10*3/uL (ref 4.0–10.5)
nRBC: 0 % (ref 0.0–0.2)

## 2022-03-11 LAB — MAGNESIUM: Magnesium: 1.7 mg/dL (ref 1.7–2.4)

## 2022-03-11 LAB — PREPARE RBC (CROSSMATCH)

## 2022-03-11 MED ORDER — POTASSIUM CHLORIDE IN NACL 20-0.9 MEQ/L-% IV SOLN: INTRAVENOUS | Status: AC

## 2022-03-11 MED ORDER — ALUM & MAG HYDROXIDE-SIMETH 200-200-20 MG/5ML PO SUSP
30.0000 mL | ORAL | Status: DC | PRN
  Administered 2022-03-11: 30 mL via ORAL
  Filled 2022-03-11: qty 30

## 2022-03-11 MED ORDER — POTASSIUM CHLORIDE CRYS ER 20 MEQ PO TBCR
20.0000 meq | EXTENDED_RELEASE_TABLET | Freq: Once | ORAL | Status: AC
  Administered 2022-03-11: 20 meq via ORAL
  Filled 2022-03-11: qty 1

## 2022-03-11 MED ORDER — CHLORHEXIDINE GLUCONATE CLOTH 2 % EX PADS
6.0000 | MEDICATED_PAD | Freq: Every day | CUTANEOUS | Status: DC
  Administered 2022-03-11: 6 via TOPICAL

## 2022-03-11 MED ORDER — SODIUM CHLORIDE 0.9% IV SOLUTION: Freq: Once | INTRAVENOUS | Status: AC

## 2022-03-11 NOTE — Progress Notes (Signed)
Date and time results received: 03/11/22 0204 ?(use smartphrase ".now" to insert current time) ? ?Test: Blood cultures ?Critical Value: Gram negative rods in both bottles ? ?Name of Provider Notified: Dr. Josephine Cables ? ?Orders Received? Or Actions Taken?:  NNO at this time ?

## 2022-03-11 NOTE — TOC Initial Note (Signed)
Transition of Care (TOC) - Initial/Assessment Note  ? ? ?Patient Details  ?Name: [name] ?MRN: [id number] ?Date of Birth: 14-Dec-1955 ? ?Transition of Care (TOC) CM/SW Contact:    ?Salome Arnt, LCSW ?Phone Number: ?03/11/2022, 7:55 AM ? ?Clinical Narrative:  Pt admitted due to sepsis. Assessment complete as pt is high risk of readmission. Pt reports she lives alone, but her daughter has been staying with her some. She is independent with ADLs at baseline and ambulates with a walker. Pt is on 3L home O2 ([employer]). Her daughter provides transportation to appointments. Pt is followed by [name] for ovarian cancer. She plans to return home when medically stable. TOC will continue to follow and assist with d/c planning needs.                 ? ? ?Expected Discharge Plan: Home/Self Care ?Barriers to Discharge: Continued Medical Work up ? ? ?Patient Goals and CMS Choice ?Patient states their goals for this hospitalization and ongoing recovery are:: return home ?  ?Choice offered to / list presented to : Patient ? ?Expected Discharge Plan and Services ?Expected Discharge Plan: Home/Self Care ?In-house Referral: Clinical Social Work ?  ?  ?Living arrangements for the past 2 months: Apartment ?                ?  ?  ?  ?  ?  ?  ?  ?  ?  ?  ? ?Prior Living Arrangements/Services ?Living arrangements for the past 2 months: Apartment ?Lives with:: Self ?Patient language and need for interpreter reviewed:: Yes ?Do you feel safe going back to the place where you live?: Yes      ?Need for Family Participation in Patient Care: No (Comment) ?Care giver support system in place?: Yes (comment) ?Current home services: DME (home O2, walker) ?Criminal Activity/Legal Involvement Pertinent to Current Situation/Hospitalization: No - Comment as needed ? ?Activities of Daily Living ?Home Assistive Devices/Equipment: Gilford Rile (specify type) ?ADL Screening (condition at time of admission) ?Patient's cognitive ability adequate to  safely complete daily activities?: Yes ?Is the patient deaf or have difficulty hearing?: No ?Does the patient have difficulty seeing, even when wearing glasses/contacts?: No ?Does the patient have difficulty concentrating, remembering, or making decisions?: No ?Patient able to express need for assistance with ADLs?: Yes ?Does the patient have difficulty dressing or bathing?: No ?Independently performs ADLs?: Yes (appropriate for developmental age) ?Does the patient have difficulty walking or climbing stairs?: Yes ?Weakness of Legs: Both ?Weakness of Arms/Hands: None ? ?Permission Sought/Granted ?  ?  ?   ?   ?   ?   ? ?Emotional Assessment ?  ?  ?Affect (typically observed): Appropriate ?Orientation: : Oriented to Self, Oriented to Place, Oriented to  Time, Oriented to Situation ?Alcohol / Substance Use: Not Applicable ?Psych Involvement: No (comment) ? ?Admission diagnosis:  Sepsis due to undetermined organism [facility]) [A41.9] ?Sepsis, due to unspecified organism, unspecified whether acute organ dysfunction present ([place]) [A41.9] ?Patient Active Problem List  ? Diagnosis Date Noted  ? Hyponatremia 03/10/2022  ? Thrombocytopenia ([place]) 03/10/2022  ? Acute on chronic respiratory failure with hypoxia and hypercapnia ([place]) 12/30/2021  ? Deficiency anemia 05/23/2021  ? 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 Upper GI bleed 02/16/2021  ? Sepsis due to undetermined organism ([place]) 12/18/2020  ? Lobar pneumonia ([place]) 12/18/2020  ? Syncope 12/17/2020  ? Genetic testing 08/01/2020  ? Ovarian cancer ([place]) 07/07/2020  ? Port-A-Cath in place 07/03/2020  ?  Family history of prostate cancer 07/03/2020  ? Goals of care, counseling/discussion 07/01/2020  ? Peritoneal carcinomatosis ([place]) 06/06/2020  ? Pneumonia 09/13/2018  ? Acute on chronic respiratory failure ([place]) 01/22/2018  ? CAP (community acquired pneumonia) 01/18/2016  ? Anemia, iron deficiency 11/07/2015  ? Chronic respiratory failure with hypoxia ([place])  11/07/2015  ? Acute on chronic respiratory failure with hypoxia ([place]) 11/07/2015  ? Mixed hyperlipidemia 11/07/2015  ? COPD (chronic obstructive pulmonary disease) ([place]) 11/06/2015  ? Hypertension 11/06/2015  ? ?PCP:  [name], MD ?Pharmacy:   ?[place], [place] [address] ?[address] ?[place] [address] ?Phone: [phone] Fax: [phone] ? ? ? ? ?Social Determinants of Health (SDOH) Interventions ?  ? ?Readmission Risk Interventions ? ?  03/11/2022  ?  7:53 AM 12/20/2020  ?  1:27 PM  ?Readmission Risk Prevention Plan  ?Transportation Screening Complete Complete  ?[place] or Home Care Consult Complete Complete  ?Social Work Consult for [place] Planning/Counseling Complete Complete  ?Palliative Care Screening Not Applicable Not Applicable  ?Medication Review [employer]) Complete Complete  ? ? ? ?

## 2022-03-11 NOTE — Assessment & Plan Note (Addendum)
Continue ceftriaxone 2 grams daily ?Await final susceptiblity ?

## 2022-03-11 NOTE — Progress Notes (Signed)
Date and time results received: 03/11/22 0446 ?(use smartphrase ".now" to insert current time) ? ?Test: Blood cultures ?Critical Value: Both positive for E Coli ? ?Name of Provider Notified: Dr. Josephine Cables ? ?Orders Received? Or Actions Taken?:  NNO at this time ?

## 2022-03-11 NOTE — Progress Notes (Signed)
?  ?       ?PROGRESS NOTE ? ?[name] PJA:[id number] DOB: November 24, 1955 DOA: 03/10/2022 ?PCP: [name], MD ? ?Brief History:  ?66 year old female with a history of ovarian carcinoma with omental carcinomatosis currently on niraparib, COPD, chronic respiratory failure on 3 L, hypertension, hyperlipidemia, and tremor presenting with fevers, chills, left-sided abdominal pain and generalized weakness that started on 03/08/2022.  The patient states that her fever went up to 103.3 ?F on the morning of admission.  She did have some dysuria and urinary frequency that started on 03/08/2022.  She states that she does have a little more dyspnea on exertion than usual.  She has chronic shortness of breath.  She has a cough with white sputum.  Her cough has been chronic.  She denies any hemoptysis.  She has not had any nausea, vomiting, diarrhea.  Her abdominal pain also began on 03/08/2022 that was left-sided that has been off and on.  There is no hematochezia or melena.  She denies any NSAIDs or any new medications.  Because of her fever, shortness of breath, and progressive generalized weakness, the patient presented for further evaluation. ?In the ED, the patient had low-grade temperature of 99.6 ?F.  She was tachycardic into the 120s.  She had soft blood pressures with systolic BP in the 34L.  Oxygen saturation 100% on 3 L.  WBC 6.4, hemoglobin 8.0, platelets 67,000 sodium 133, potassium 3.6, bicarbonate 33, creatinine 0.70.  CT abdomen and pelvis showed a stable 5 mm right lobe liver lesion and Persistent omental fat stranding and subtle nodularity in the right and central anterior abdomen, adjacent to the transverse colon, thought to represent omental carcinomatosis.  UA showed >50 WBC.  Lactic acid 0.8.  The patient was given the appropriate IV fluid and started on cefepime.  ? ? ?Assessment and Plan: ?* Sepsis due to undetermined organism [facility]) ?Source is UTI and bacteremia ?-- Initially given cefepime in the  ED ?continue ceftriaxone pending urine culture data ?blood cultures--E coli ?Chest x-ray without any infiltrates ?Lactic acid 0.8 ?Continue IV fluids ? ?Hypokalemia ?Replete ?Add KCl to IVF ? ?E coli bacteremia ?Continue ceftriaxone ?Await final susceptiblity ? ?Thrombocytopenia ([place]) ?Chronic ?Acute drop due to sepsis ?Monitor for signs of bleed ?SCDs ? ?Hyponatremia ?Due to volume depletion ?Continue IVF>>improving ? ?Ovarian cancer ([place]) ?Continue niraparib 200 mg daily ?-CT abdomen shows stable omental carcinomatosis ?Follow-up with [name] ? ?Mixed hyperlipidemia ?Continue statin ? ?Chronic respiratory failure with hypoxia (HCC) ?Stable on 3 L nasal cannula ?No increased work of breathing ? ?Hypertension ?Initially Holding amlodipine secondary to soft blood pressures ? ?COPD (chronic obstructive pulmonary disease) ([place]) ?Stable presently without any wheezing ?Continue bronchodilators ? ? ? ? ?Family Communication:  no  Family at bedside ? ?Consultants:  none ? ?Code Status:  FULL  ? ?DVT Prophylaxis:  SCDs ? ? ?Procedures: ?As Listed in Progress Note Above ? ?Antibiotics: ?Ceftriaxone 4/23>> ? ? ? ? ? ? ?Subjective: ?Patient denies fevers, chills, headache, chest pain, dyspnea, nausea, vomiting, diarrhea, abdominal pain, dysuria, hematuria, hematochezia, and melena. ? ? ?Objective: ?Vitals:  ? 03/11/22 0705 03/11/22 0800 03/11/22 1350 03/11/22 1400  ?BP:  (!) 127/94 125/78   ?Pulse:  (!) 108 81   ?Resp:  18 19   ?Temp:  99 ?F (37.2 ?C) 98.8 ?F (37.1 ?C)   ?TempSrc:  Oral Oral   ?SpO2: 99% 98% 98% 98%  ?Weight:      ?Height:      ? ? ?  Intake/Output Summary (Last 24 hours) at 03/11/2022 1726 ?Last data filed at 03/11/2022 1300 ?Gross per 24 hour  ?Intake 2365.15 ml  ?Output 1500 ml  ?Net 865.15 ml  ? ?Weight change:  ?Exam: ? ?General:  Pt is alert, follows commands appropriately, not in acute distress ?HEENT: No icterus, No thrush, No neck mass, [place]/AT ?Cardiovascular: RRR, S1/S2, no rubs, no  gallops ?Respiratory: diminished BS.  No wheeze. ?Abdomen: Soft/+BS, non tender, non distended, no guarding ?Extremities: No edema, No lymphangitis, No petechiae, No rashes, no synovitis ? ? ?Data Reviewed: ?I have personally reviewed following labs and imaging studies ?Basic Metabolic Panel: ?Recent Labs  ?Lab 03/10/22 ?1040 03/11/22 ?0500  ?NA 133* 134*  ?K 3.6 3.3*  ?CL 93* 96*  ?CO2 33* 31  ?GLUCOSE 103* 108*  ?BUN 12 10  ?CREATININE 0.70 0.56  ?CALCIUM 8.0* 7.9*  ?MG  --  1.7  ? ?Liver Function Tests: ?Recent Labs  ?Lab 03/10/22 ?1040  ?AST 19  ?ALT 11  ?ALKPHOS 67  ?BILITOT 0.5  ?PROT 6.0*  ?ALBUMIN 3.2*  ? ?No results for input(s): LIPASE, AMYLASE in the last 168 hours. ?No results for input(s): AMMONIA in the last 168 hours. ?Coagulation Profile: ?Recent Labs  ?Lab 03/10/22 ?1040  ?INR 1.0  ? ?CBC: ?Recent Labs  ?Lab 03/10/22 ?1040 03/11/22 ?0500  ?WBC 6.4 5.4  ?NEUTROABS 4.9  --   ?HGB 8.0* 7.2*  ?HCT 24.4* 22.8*  ?MCV 100.4* 100.0  ?PLT 67* 49*  ? ?Cardiac Enzymes: ?No results for input(s): CKTOTAL, CKMB, CKMBINDEX, TROPONINI in the last 168 hours. ?BNP: ?Invalid input(s): POCBNP ?CBG: ?No results for input(s): GLUCAP in the last 168 hours. ?HbA1C: ?No results for input(s): HGBA1C in the last 72 hours. ?Urine analysis: ?   ?Component Value Date/Time  ? COLORURINE STRAW (A) 03/10/2022 1232  ? APPEARANCEUR HAZY (A) 03/10/2022 1232  ? APPEARANCEUR Clear 10/08/2013 2007  ? LABSPEC 1.031 (H) 03/10/2022 1232  ? LABSPEC 1.003 10/08/2013 2007  ? PHURINE 6.0 03/10/2022 1232  ? GLUCOSEU NEGATIVE 03/10/2022 1232  ? GLUCOSEU Negative 10/08/2013 2007  ? HGBUR SMALL (A) 03/10/2022 1232  ? [place] NEGATIVE 03/10/2022 1232  ? BILIRUBINUR Negative 10/08/2013 2007  ? Benjamin Stain NEGATIVE 03/10/2022 1232  ? PROTEINUR NEGATIVE 03/10/2022 1232  ? NITRITE NEGATIVE 03/10/2022 1232  ? LEUKOCYTESUR LARGE (A) 03/10/2022 1232  ? LEUKOCYTESUR Negative 10/08/2013 2007  ? ?Sepsis  Labs: ?'@LABRCNTIP'$ (procalcitonin:4,lacticidven:4) ?) ?Recent Results (from the past 240 hour(s))  ?Resp Panel by RT-PCR (Flu A&B, Covid) Nasopharyngeal Swab     Status: None  ? Collection Time: 03/10/22 10:15 AM  ? Specimen: Nasopharyngeal Swab; Nasopharyngeal(NP) swabs in vial transport medium  ?Result Value Ref Range Status  ? SARS Coronavirus 2 by RT PCR NEGATIVE NEGATIVE Final  ?  Comment: (NOTE) ?SARS-CoV-2 target nucleic acids are NOT DETECTED. ? ?The SARS-CoV-2 RNA is generally detectable in upper respiratory ?specimens during the acute phase of infection. The lowest ?concentration of SARS-CoV-2 viral copies this assay can detect is ?138 copies/mL. A negative result does not preclude SARS-Cov-2 ?infection and should not be used as the sole basis for treatment or ?other patient management decisions. A negative result may occur with  ?improper specimen collection/handling, submission of specimen other ?than nasopharyngeal swab, presence of viral mutation(s) within the ?areas targeted by this assay, and inadequate number of viral ?copies(<138 copies/mL). A negative result must be combined with ?clinical observations, patient history, and epidemiological ?information. The expected result is Negative. ? ?Fact Sheet for Patients:  ?EntrepreneurPulse.com.au ? ?Fact  Sheet for Healthcare Providers:  ?IncredibleEmployment.be ? ?This test is no t yet approved or cleared by the [place] FDA and  ?has been authorized for detection and/or diagnosis of SARS-CoV-2 by ?FDA under an Emergency Use Authorization (EUA). This EUA will remain  ?in effect (meaning this test can be used) for the duration of the ?COVID-19 declaration under Section 564(b)(1) of the Act, 21 ?U.S.C.section 360bbb-3(b)(1), unless the authorization is terminated  ?or revoked sooner.  ? ? ?  ? Influenza A by PCR NEGATIVE NEGATIVE Final  ? Influenza B by PCR NEGATIVE NEGATIVE Final  ?  Comment: (NOTE) ?The Xpert Xpress  SARS-CoV-2/FLU/RSV plus assay is intended as an aid ?in the diagnosis of influenza from Nasopharyngeal swab specimens and ?should not be used as a sole basis for treatment. Nasal washings and ?aspirates are unacceptable for Xpert Xpress SARS-CoV-2/FLU/RSV ?testing. ? ?Fact Sh

## 2022-03-11 NOTE — Assessment & Plan Note (Addendum)
Repleted ?Add KCl to IVF ?Mag 1.9 ?

## 2022-03-12 DIAGNOSIS — R7881 Bacteremia: Secondary | ICD-10-CM | POA: Diagnosis not present

## 2022-03-12 DIAGNOSIS — E871 Hypo-osmolality and hyponatremia: Secondary | ICD-10-CM | POA: Diagnosis not present

## 2022-03-12 DIAGNOSIS — C569 Malignant neoplasm of unspecified ovary: Secondary | ICD-10-CM

## 2022-03-12 DIAGNOSIS — E876 Hypokalemia: Secondary | ICD-10-CM

## 2022-03-12 DIAGNOSIS — A419 Sepsis, unspecified organism: Secondary | ICD-10-CM | POA: Diagnosis not present

## 2022-03-12 DIAGNOSIS — B9620 Unspecified Escherichia coli [E. coli] as the cause of diseases classified elsewhere: Secondary | ICD-10-CM

## 2022-03-12 DIAGNOSIS — A4151 Sepsis due to Escherichia coli [E. coli]: Principal | ICD-10-CM

## 2022-03-12 LAB — BPAM RBC
Blood Product Expiration Date: 202305222359
ISSUE DATE / TIME: 202304242010
Unit Type and Rh: 9500

## 2022-03-12 LAB — TYPE AND SCREEN
ABO/RH(D): O NEG
Antibody Screen: NEGATIVE
Unit division: 0

## 2022-03-12 LAB — CBC
HCT: 26.5 % — ABNORMAL LOW (ref 36.0–46.0)
Hemoglobin: 8.7 g/dL — ABNORMAL LOW (ref 12.0–15.0)
MCH: 31 pg (ref 26.0–34.0)
MCHC: 32.8 g/dL (ref 30.0–36.0)
MCV: 94.3 fL (ref 80.0–100.0)
Platelets: 41 10*3/uL — ABNORMAL LOW (ref 150–400)
RBC: 2.81 MIL/uL — ABNORMAL LOW (ref 3.87–5.11)
RDW: 19.1 % — ABNORMAL HIGH (ref 11.5–15.5)
WBC: 3.3 10*3/uL — ABNORMAL LOW (ref 4.0–10.5)
nRBC: 0 % (ref 0.0–0.2)

## 2022-03-12 LAB — BASIC METABOLIC PANEL
Anion gap: 7 (ref 5–15)
BUN: 7 mg/dL — ABNORMAL LOW (ref 8–23)
CO2: 31 mmol/L (ref 22–32)
Calcium: 8.1 mg/dL — ABNORMAL LOW (ref 8.9–10.3)
Chloride: 99 mmol/L (ref 98–111)
Creatinine, Ser: 0.44 mg/dL (ref 0.44–1.00)
GFR, Estimated: >60 mL/min (ref >60)
Glucose, Bld: 94 mg/dL (ref 70–99)
Potassium: 3.6 mmol/L (ref 3.5–5.1)
Sodium: 137 mmol/L (ref 135–145)

## 2022-03-12 LAB — MAGNESIUM: Magnesium: 1.9 mg/dL (ref 1.7–2.4)

## 2022-03-12 LAB — HIV ANTIBODY (ROUTINE TESTING W REFLEX): HIV Screen 4th Generation wRfx: NONREACTIVE

## 2022-03-12 MED ORDER — NIRAPARIB TOSYLATE 100 MG PO CAPS
200.0000 mg | ORAL_CAPSULE | Freq: Every day | ORAL | Status: DC
  Administered 2022-03-12: 200 mg via ORAL

## 2022-03-12 MED ORDER — MAGNESIUM OXIDE -MG SUPPLEMENT 400 (240 MG) MG PO TABS
400.0000 mg | ORAL_TABLET | Freq: Two times a day (BID) | ORAL | Status: DC
  Administered 2022-03-12 – 2022-03-13 (×3): 400 mg via ORAL
  Filled 2022-03-12 (×3): qty 1

## 2022-03-12 MED ORDER — ASPIRIN EC 81 MG PO TBEC
81.0000 mg | DELAYED_RELEASE_TABLET | Freq: Every day | ORAL | Status: DC
  Administered 2022-03-12 – 2022-03-13 (×2): 81 mg via ORAL
  Filled 2022-03-12 (×2): qty 1

## 2022-03-12 MED ORDER — IPRATROPIUM-ALBUTEROL 0.5-2.5 (3) MG/3ML IN SOLN
3.0000 mL | Freq: Three times a day (TID) | RESPIRATORY_TRACT | Status: DC
  Administered 2022-03-12 – 2022-03-13 (×4): 3 mL via RESPIRATORY_TRACT
  Filled 2022-03-12 (×4): qty 3

## 2022-03-12 MED ORDER — NORTRIPTYLINE HCL 25 MG PO CAPS
25.0000 mg | ORAL_CAPSULE | Freq: Every day | ORAL | Status: DC
  Administered 2022-03-12: 25 mg via ORAL
  Filled 2022-03-12: qty 1

## 2022-03-12 MED ORDER — PANTOPRAZOLE SODIUM 40 MG PO TBEC
40.0000 mg | DELAYED_RELEASE_TABLET | Freq: Every day | ORAL | Status: DC
  Administered 2022-03-12 – 2022-03-13 (×2): 40 mg via ORAL
  Filled 2022-03-12 (×2): qty 1

## 2022-03-12 MED ORDER — IPRATROPIUM-ALBUTEROL 0.5-2.5 (3) MG/3ML IN SOLN: 3.0000 mL | RESPIRATORY_TRACT | Status: DC | PRN

## 2022-03-12 MED ORDER — FERROUS SULFATE 325 (65 FE) MG PO TABS
325.0000 mg | ORAL_TABLET | ORAL | Status: DC
Start: 2022-03-12 — End: 2022-03-13
  Administered 2022-03-12: 325 mg via ORAL
  Filled 2022-03-12: qty 1

## 2022-03-12 MED ORDER — ATORVASTATIN CALCIUM 40 MG PO TABS
40.0000 mg | ORAL_TABLET | Freq: Every day | ORAL | Status: DC
  Administered 2022-03-12 – 2022-03-13 (×2): 40 mg via ORAL
  Filled 2022-03-12 (×2): qty 1

## 2022-03-12 MED ORDER — PRIMIDONE 50 MG PO TABS
50.0000 mg | ORAL_TABLET | Freq: Every day | ORAL | Status: DC
  Administered 2022-03-12: 50 mg via ORAL
  Filled 2022-03-12: qty 1

## 2022-03-12 NOTE — Progress Notes (Signed)
Pt vitals stabilized after she received 1 unit blood.  ? 03/11/22 1926  ?Assess: MEWS Score  ?Temp 100.1 ?F (37.8 ?C)  ?BP 139/66  ?Pulse Rate (!) 111  ?Resp 20  ?SpO2 100 %  ?Assess: MEWS Score  ?MEWS Temp 0  ?MEWS Systolic 0  ?MEWS Pulse 2  ?MEWS RR 0  ?MEWS LOC 0  ?MEWS Score 2  ?MEWS Score Color Yellow  ?Assess: if the MEWS score is Yellow or Red  ?Were vital signs taken at a resting state? Yes  ?Focused Assessment No change from prior assessment  ?MEWS guidelines implemented *See Row Information* Yes  ?Take Vital Signs  ?Increase Vital Sign Frequency  Yellow: Q 2hr X 2 then Q 4hr X 2, if remains yellow, continue Q 4hrs  ?Escalate  ?MEWS: Escalate Yellow: discuss with charge nurse/RN and consider discussing with provider and RRT  ?Notify: Charge Nurse/RN  ?Name of Charge Nurse/RN Notified Tiffany RN  ?Date Charge Nurse/RN Notified 03/11/22  ?Time Charge Nurse/RN Notified 1945  ?Notify: Provider  ?Provider Name/Title T. Opry  ?Date Provider Notified 03/11/22  ?Time Provider Notified 1942  ?Notification Type  ?(amion)  ?Notification Reason Other (Comment) ?(mews and temp 100.1 due to get blood)  ?Provider response Other (Comment) ?(can give blood)  ?Date of Provider Response 03/11/22  ?Time of Provider Response 1950  ?Document  ?Patient Outcome Stabilized after interventions  ?Progress note created (see row info) Yes  ? ? ?

## 2022-03-12 NOTE — Progress Notes (Signed)
?  ?       ?PROGRESS NOTE ? ?[name] DTO:[id number] DOB: 22-Apr-1956 DOA: 03/10/2022 ?PCP: [name], MD ? ?Brief History:  ?66 year old female with a history of ovarian carcinoma with omental carcinomatosis currently on niraparib, COPD, chronic respiratory failure on 3 L, hypertension, hyperlipidemia, and tremor presenting with fevers, chills, left-sided abdominal pain and generalized weakness that started on 03/08/2022.  The patient states that her fever went up to 103.3 ?F on the morning of admission.  She did have some dysuria and urinary frequency that started on 03/08/2022.  She states that she does have a little more dyspnea on exertion than usual.  She has chronic shortness of breath.  She has a cough with white sputum.  Her cough has been chronic.  She denies any hemoptysis.  She has not had any nausea, vomiting, diarrhea.  Her abdominal pain also began on 03/08/2022 that was left-sided that has been off and on.  There is no hematochezia or melena.  She denies any NSAIDs or any new medications.  Because of her fever, shortness of breath, and progressive generalized weakness, the patient presented for further evaluation. ?In the ED, the patient had low-grade temperature of 99.6 ?F.  She was tachycardic into the 120s.  She had soft blood pressures with systolic BP in the 98P.  Oxygen saturation 100% on 3 L.  WBC 6.4, hemoglobin 8.0, platelets 67,000 sodium 133, potassium 3.6, bicarbonate 33, creatinine 0.70.  CT abdomen and pelvis showed a stable 5 mm right lobe liver lesion and Persistent omental fat stranding and subtle nodularity in the right and central anterior abdomen, adjacent to the transverse colon, thought to represent omental carcinomatosis.  UA showed >50 WBC.  Lactic acid 0.8.  The patient was given the appropriate IV fluid and started on cefepime.  ? ?Assessment and Plan: ?* Sepsis due to undetermined organism [facility]) ?Source is UTI and bacteremia ?-- Initially given cefepime in the  ED ?continue ceftriaxone 2 grams daily pending urine culture data ?blood cultures--E coli ?Chest x-ray without any infiltrates ?Lactic acid 0.8 ?Continue IV fluids ? ?Hypokalemia ?Repleted ?Add KCl to IVF ?Mag 1.9 ? ?E coli bacteremia ?Continue ceftriaxone 2 grams daily ?Await final susceptiblity ? ?Thrombocytopenia ([place]) ?Chronic ?Acute drop due to sepsis ?Monitor for signs of bleed ?SCDs ? ?Hyponatremia ?Due to volume depletion ?Continue IVF>>improving ? ?Ovarian cancer ([place]) ?Restart niraparib 200 mg daily ?-CT abdomen shows stable omental carcinomatosis ?Follow-up with [name] ? ?Mixed hyperlipidemia ?Continue statin ? ?Chronic respiratory failure with hypoxia (HCC) ?Stable on 3 L nasal cannula ?No increased work of breathing ? ?Hypertension ?Initially Holding amlodipine secondary to soft blood pressures ? ?COPD (chronic obstructive pulmonary disease) ([place]) ?Stable presently without any wheezing ?Continue bronchodilators ? ? ? ? ? ? ? ? ?Family Communication:   daughter updated at bedside 4/25 ? ?Consultants:  none ? ?Code Status:  FULL  ? ?DVT Prophylaxis:  SCDs ? ?Procedures: ?As Listed in Progress Note Above ? ?Antibiotics: ?Ceftriaxone 4/23>> ?  ?  ? ? ? ? ?Subjective: ?Patient is feeling better.  States sob is improving.  Denies cp, sob, n/v/d, abd pain ? ?Objective: ?Vitals:  ? 03/12/22 0320 03/12/22 0714 03/12/22 1347 03/12/22 1411  ?BP: 118/70   115/76  ?Pulse: 91   80  ?Resp: 20   19  ?Temp: 98.5 ?F (36.9 ?C)   98.2 ?F (36.8 ?C)  ?TempSrc: Oral   Oral  ?SpO2: 99% 99% 99% 98%  ?Weight:      ?  Height:      ? ? ?Intake/Output Summary (Last 24 hours) at 03/12/2022 1623 ?Last data filed at 03/12/2022 1300 ?Gross per 24 hour  ?Intake 1943.16 ml  ?Output 600 ml  ?Net 1343.16 ml  ? ?Weight change:  ?Exam: ? ?General:  Pt is alert, follows commands appropriately, not in acute distress ?HEENT: No icterus, No thrush, No neck mass, [place]/AT ?Cardiovascular: RRR, S1/S2, no rubs, no gallops ?Respiratory: bibasilar  rales.  Diminished BS.  No wheeze ?Abdomen: Soft/+BS, non tender, non distended, no guarding ?Extremities: No edema, No lymphangitis, No petechiae, No rashes, no synovitis ? ? ?Data Reviewed: ?I have personally reviewed following labs and imaging studies ?Basic Metabolic Panel: ?Recent Labs  ?Lab 03/10/22 ?1040 03/11/22 ?0500 03/12/22 ?0452  ?NA 133* 134* 137  ?K 3.6 3.3* 3.6  ?CL 93* 96* 99  ?CO2 33* 31 31  ?GLUCOSE 103* 108* 94  ?BUN 12 10 7*  ?CREATININE 0.70 0.56 0.44  ?CALCIUM 8.0* 7.9* 8.1*  ?MG  --  1.7 1.9  ? ?Liver Function Tests: ?Recent Labs  ?Lab 03/10/22 ?1040  ?AST 19  ?ALT 11  ?ALKPHOS 67  ?BILITOT 0.5  ?PROT 6.0*  ?ALBUMIN 3.2*  ? ?No results for input(s): LIPASE, AMYLASE in the last 168 hours. ?No results for input(s): AMMONIA in the last 168 hours. ?Coagulation Profile: ?Recent Labs  ?Lab 03/10/22 ?1040  ?INR 1.0  ? ?CBC: ?Recent Labs  ?Lab 03/10/22 ?1040 03/11/22 ?0500 03/12/22 ?0452  ?WBC 6.4 5.4 3.3*  ?NEUTROABS 4.9  --   --   ?HGB 8.0* 7.2* 8.7*  ?HCT 24.4* 22.8* 26.5*  ?MCV 100.4* 100.0 94.3  ?PLT 67* 49* 41*  ? ?Cardiac Enzymes: ?No results for input(s): CKTOTAL, CKMB, CKMBINDEX, TROPONINI in the last 168 hours. ?BNP: ?Invalid input(s): POCBNP ?CBG: ?No results for input(s): GLUCAP in the last 168 hours. ?HbA1C: ?No results for input(s): HGBA1C in the last 72 hours. ?Urine analysis: ?   ?Component Value Date/Time  ? COLORURINE STRAW (A) 03/10/2022 1232  ? APPEARANCEUR HAZY (A) 03/10/2022 1232  ? APPEARANCEUR Clear 10/08/2013 2007  ? LABSPEC 1.031 (H) 03/10/2022 1232  ? LABSPEC 1.003 10/08/2013 2007  ? PHURINE 6.0 03/10/2022 1232  ? GLUCOSEU NEGATIVE 03/10/2022 1232  ? GLUCOSEU Negative 10/08/2013 2007  ? HGBUR SMALL (A) 03/10/2022 1232  ? [place] NEGATIVE 03/10/2022 1232  ? BILIRUBINUR Negative 10/08/2013 2007  ? Benjamin Stain NEGATIVE 03/10/2022 1232  ? PROTEINUR NEGATIVE 03/10/2022 1232  ? NITRITE NEGATIVE 03/10/2022 1232  ? LEUKOCYTESUR LARGE (A) 03/10/2022 1232  ? LEUKOCYTESUR Negative  10/08/2013 2007  ? ?Sepsis Labs: ?'@LABRCNTIP'$ (procalcitonin:4,lacticidven:4) ?) ?Recent Results (from the past 240 hour(s))  ?Resp Panel by RT-PCR (Flu A&B, Covid) Nasopharyngeal Swab     Status: None  ? Collection Time: 03/10/22 10:15 AM  ? Specimen: Nasopharyngeal Swab; Nasopharyngeal(NP) swabs in vial transport medium  ?Result Value Ref Range Status  ? SARS Coronavirus 2 by RT PCR NEGATIVE NEGATIVE Final  ?  Comment: (NOTE) ?SARS-CoV-2 target nucleic acids are NOT DETECTED. ? ?The SARS-CoV-2 RNA is generally detectable in upper respiratory ?specimens during the acute phase of infection. The lowest ?concentration of SARS-CoV-2 viral copies this assay can detect is ?138 copies/mL. A negative result does not preclude SARS-Cov-2 ?infection and should not be used as the sole basis for treatment or ?other patient management decisions. A negative result may occur with  ?improper specimen collection/handling, submission of specimen other ?than nasopharyngeal swab, presence of viral mutation(s) within the ?areas targeted by this assay, and inadequate number  of viral ?copies(<138 copies/mL). A negative result must be combined with ?clinical observations, patient history, and epidemiological ?information. The expected result is Negative. ? ?Fact Sheet for Patients:  ?EntrepreneurPulse.com.au ? ?Fact Sheet for Healthcare Providers:  ?IncredibleEmployment.be ? ?This test is no t yet approved or cleared by the [place] FDA and  ?has been authorized for detection and/or diagnosis of SARS-CoV-2 by ?FDA under an Emergency Use Authorization (EUA). This EUA will remain  ?in effect (meaning this test can be used) for the duration of the ?COVID-19 declaration under Section 564(b)(1) of the Act, 21 ?U.S.C.section 360bbb-3(b)(1), unless the authorization is terminated  ?or revoked sooner.  ? ? ?  ? Influenza A by PCR NEGATIVE NEGATIVE Final  ? Influenza B by PCR NEGATIVE NEGATIVE Final  ?   Comment: (NOTE) ?The Xpert Xpress SARS-CoV-2/FLU/RSV plus assay is intended as an aid ?in the diagnosis of influenza from Nasopharyngeal swab specimens and ?should not be used as a sole basis for treatment. Nasal washin

## 2022-03-12 NOTE — Plan of Care (Addendum)
Pt is alert and oriented x 4. Pt is up with 1 assist. Prior to receiving blood pt was short of breath with exertion. Mew was 2-3. After pt received 1 unit of blood mews 0-1. Pt request purewick be applied during overnight due to having increased shortness of breath weakness with mobility. Tylenol given for side/abdomen pain.  Msg left for provider regarding patient and family concerns regarding restarting home meds and chemo meds. Pt husband plans on bringing in meds today just in case they are not stocked at this facility.  ?Problem: Education: ?Goal: Knowledge of General Education information will improve ?Description: Including pain rating scale, medication(s)/side effects and non-pharmacologic comfort measures ?Outcome: Progressing ?  ?Problem: Health Behavior/Discharge Planning: ?Goal: Ability to manage health-related needs will improve ?Outcome: Progressing ?  ?Problem: Clinical Measurements: ?Goal: Ability to maintain clinical measurements within normal limits will improve ?Outcome: Progressing ?Goal: Will remain free from infection ?Outcome: Progressing ?Goal: Diagnostic test results will improve ?Outcome: Progressing ?Goal: Respiratory complications will improve ?Outcome: Progressing ?Goal: Cardiovascular complication will be avoided ?Outcome: Progressing ?  ?Problem: Activity: ?Goal: Risk for activity intolerance will decrease ?Outcome: Progressing ?  ?Problem: Nutrition: ?Goal: Adequate nutrition will be maintained ?Outcome: Progressing ?  ?Problem: Coping: ?Goal: Level of anxiety will decrease ?Outcome: Progressing ?  ?Problem: Elimination: ?Goal: Will not experience complications related to bowel motility ?Outcome: Progressing ?Goal: Will not experience complications related to urinary retention ?Outcome: Progressing ?  ?Problem: Pain Managment: ?Goal: General experience of comfort will improve ?Outcome: Progressing ?  ?Problem: Safety: ?Goal: Ability to remain free from injury will improve ?Outcome:  Progressing ?  ?Problem: Skin Integrity: ?Goal: Risk for impaired skin integrity will decrease ?Outcome: Progressing ?  ?

## 2022-03-12 NOTE — Progress Notes (Signed)
Provider aware pt spiking temp prior to blood being picked up. Provider would like for pt to go ahead and received blood.  ? 03/11/22 1926  ?Notify: Provider  ?Provider Name/Title T. Opry  ?Date Provider Notified 03/11/22  ?Time Provider Notified 1942  ?Notification Type  ?(amion)  ?Notification Reason Other (Comment) ?(mews and temp 100.1 due to get blood)  ?Provider response Other (Comment) ?(can give blood)  ?Date of Provider Response 03/11/22  ?Time of Provider Response 1950  ? ? ?

## 2022-03-13 ENCOUNTER — Telehealth (HOSPITAL_COMMUNITY): Payer: Self-pay | Admitting: *Deleted

## 2022-03-13 DIAGNOSIS — R7881 Bacteremia: Secondary | ICD-10-CM

## 2022-03-13 DIAGNOSIS — C569 Malignant neoplasm of unspecified ovary: Secondary | ICD-10-CM

## 2022-03-13 DIAGNOSIS — I10 Essential (primary) hypertension: Secondary | ICD-10-CM | POA: Diagnosis not present

## 2022-03-13 DIAGNOSIS — B9620 Unspecified Escherichia coli [E. coli] as the cause of diseases classified elsewhere: Secondary | ICD-10-CM

## 2022-03-13 DIAGNOSIS — J9611 Chronic respiratory failure with hypoxia: Secondary | ICD-10-CM | POA: Diagnosis not present

## 2022-03-13 DIAGNOSIS — A419 Sepsis, unspecified organism: Secondary | ICD-10-CM | POA: Diagnosis not present

## 2022-03-13 LAB — CBC
HCT: 26.9 % — ABNORMAL LOW (ref 36.0–46.0)
Hemoglobin: 8.8 g/dL — ABNORMAL LOW (ref 12.0–15.0)
MCH: 31.2 pg (ref 26.0–34.0)
MCHC: 32.7 g/dL (ref 30.0–36.0)
MCV: 95.4 fL (ref 80.0–100.0)
Platelets: 42 10*3/uL — ABNORMAL LOW (ref 150–400)
RBC: 2.82 MIL/uL — ABNORMAL LOW (ref 3.87–5.11)
RDW: 18.1 % — ABNORMAL HIGH (ref 11.5–15.5)
WBC: 2.4 10*3/uL — ABNORMAL LOW (ref 4.0–10.5)
nRBC: 0 % (ref 0.0–0.2)

## 2022-03-13 LAB — CULTURE, BLOOD (ROUTINE X 2)

## 2022-03-13 LAB — URINE CULTURE: Culture: 10000 — AB

## 2022-03-13 MED ORDER — IPRATROPIUM BROMIDE HFA 17 MCG/ACT IN AERS: 2.0000 | INHALATION_SPRAY | Freq: Four times a day (QID) | RESPIRATORY_TRACT | 12 refills | Status: DC | PRN

## 2022-03-13 MED ORDER — CEFDINIR 300 MG PO CAPS: 300.0000 mg | ORAL_CAPSULE | Freq: Two times a day (BID) | ORAL | 0 refills | Status: AC

## 2022-03-13 MED ORDER — PREDNISONE 10 MG PO TABS: 10.0000 mg | ORAL_TABLET | Freq: Every day | ORAL | Status: DC

## 2022-03-13 NOTE — Discharge Summary (Addendum)
Physician Discharge Summary  ?[name] XFG:[id number] DOB: Oct 14, 1956 DOA: 03/10/2022 ? ?PCP: [name], MD ?Oncologist: [name]  ? ?Admit date: 03/10/2022 ?Discharge date: 03/13/2022 ? ?Admitted From:  Home  ?Disposition: Home  ? ?Recommendations for Outpatient Follow-up:  ?Follow up with PCP in 1 weeks ?Please check CBC in 1-2 weeks to follow up low platelets/pancytopenia ?Please hold niraparib tosylate (ZEJULA) until you see your oncologist due to low platelets.  ? ?Discharge Condition: STABLE   ?CODE STATUS: FULL ?DIET: Resume prior home diet   ? ?Brief Hospitalization Summary: ?Please see all hospital notes, images, labs for full details of the hospitalization. ?66 year old female with a history of ovarian carcinoma with omental carcinomatosis currently on niraparib, COPD, chronic respiratory failure on 3 L, hypertension, hyperlipidemia, and tremor presenting with fevers, chills, left-sided abdominal pain and generalized weakness that started on 03/08/2022.  The patient states that her fever went up to 103.3 ?F on the morning of admission.  She did have some dysuria and urinary frequency that started on 03/08/2022.  She states that she does have a little more dyspnea on exertion than usual.  She has chronic shortness of breath.  She has a cough with white sputum.  Her cough has been chronic.  She denies any hemoptysis.  She has not had any nausea, vomiting, diarrhea.  Her abdominal pain also began on 03/08/2022 that was left-sided that has been off and on.  There is no hematochezia or melena.  She denies any NSAIDs or any new medications.  Because of her fever, shortness of breath, and progressive generalized weakness, the patient presented for further evaluation.  In the ED, the patient had low-grade temperature of 99.6 ?F.  She was tachycardic into the 120s.  She had soft blood pressures with systolic BP in the 96V.  Oxygen saturation 100% on 3 L.  WBC 6.4, hemoglobin 8.0, platelets 67,000 sodium  133, potassium 3.6, bicarbonate 33, creatinine 0.70.  CT abdomen and pelvis showed a stable 5 mm right lobe liver lesion and Persistent omental fat stranding and subtle nodularity in the right and central anterior abdomen, adjacent to the transverse colon, thought to represent omental carcinomatosis.  UA showed >50 WBC.  Lactic acid 0.8.  The patient was given the appropriate IV fluid and started on cefepime.  ?  ?Assessment and Plan: ?Sepsis due to undetermined organism ?Source is UTI and bacteremia ?-- Initially given cefepime in the ED ?continue ceftriaxone 2 grams daily pending urine culture data ?blood cultures--E coli (mostly pansensitive) ?Chest x-ray without any infiltrates ?Lactic acid 0.8 ?Pt was treated IV fluids and sepsis physiology resolved ?  ?Hypokalemia ?Repleted ?Add KCl to IVF ?Mag 1.9 ?  ?E coli bacteremia ?Treated with ceftriaxone 2 grams daily ?DC home on oral cefdinir to complete final 5 days course ?  ?Thrombocytopenia  ?Chronic ?Acute drop thought due to sepsis ?Monitored for signs of bleed and none seen ?SCDs ?Follow up outpatient  ?  ?Hyponatremia - RESOLVED ?Due to volume depletion ?Treated with IVF>>resolved ?  ?Ovarian cancer ?Restart niraparib 200 mg daily ?-CT abdomen shows stable omental carcinomatosis ?Follow-up with [name] ?  ?Mixed hyperlipidemia ?Continue statin ?  ?Chronic respiratory failure with hypoxia ?Stable on 3 L nasal cannula ?No increased work of breathing ?  ?Hypertension ?Initially Holding amlodipine secondary to soft blood pressures ?  ?COPD (chronic obstructive pulmonary disease) ?Stable presently without any wheezing ?Continue bronchodilators ? ?Discharge Diagnoses:  ?Principal Problem: ?  Sepsis due to undetermined organism [facility]) ?Active  Problems: ?  COPD (chronic obstructive pulmonary disease) ([place]) ?  Hypertension ?  Chronic respiratory failure with hypoxia ([facility]) ?  Mixed hyperlipidemia ?  Ovarian cancer ([place]) ?  Hyponatremia ?  Thrombocytopenia ([place]) ?   E coli bacteremia ?  Hypokalemia ? ? ?Discharge Instructions: ? ?Allergies as of 03/13/2022   ?No Known Allergies ?  ? ?  ?Medication List  ?  ? ?STOP taking these medications   ? ?Zejula 100 MG capsule ?Generic drug: niraparib tosylate ?  ? ?  ? ?TAKE these medications   ? ?albuterol 108 (90 Base) MCG/ACT inhaler ?Commonly known as: VENTOLIN HFA ?Inhale 2 puffs into the lungs every 6 (six) hours as needed for wheezing or shortness of breath. ?  ?amLODipine 5 MG tablet ?Commonly known as: NORVASC ?Take 5 mg by mouth at bedtime. ?  ?aspirin EC 81 MG tablet ?Take 81 mg by mouth daily. ?  ?atorvastatin 40 MG tablet ?Commonly known as: LIPITOR ?Take 1 tablet (40 mg total) by mouth daily. ?  ?cefdinir 300 MG capsule ?Commonly known as: OMNICEF ?Take 1 capsule (300 mg total) by mouth 2 (two) times daily for 5 days. ?  ?ferrous sulfate 325 (65 FE) MG tablet ?Take 325 mg by mouth every other day. ?  ?ipratropium 17 MCG/ACT inhaler ?Commonly known as: ATROVENT HFA ?Inhale 2 puffs into the lungs every 6 (six) hours as needed for wheezing. ?What changed:  ?when to take this ?reasons to take this ?  ?levalbuterol 1.25 MG/3ML nebulizer solution ?Commonly known as: XOPENEX ?Inhale into the lungs. ?  ?magnesium oxide 400 (240 Mg) MG tablet ?Commonly known as: MAG-OX ?Take 1 tablet by mouth twice daily ?  ?MUCOMYST-10 IN ?Inhale 1 vial into the lungs every three (3) days as needed (copd). ?  ?nortriptyline 25 MG capsule ?Commonly known as: PAMELOR ?Take 25 mg by mouth at bedtime. ?  ?Omeprazole-Sodium Bicarbonate 20-1100 MG Caps capsule ?Commonly known as: ZEGERID ?Take by mouth. ?  ?OXYGEN ?Inhale 3-4 L into the lungs continuous. ?  ?predniSONE 10 MG tablet ?Commonly known as: DELTASONE ?Take 1 tablet (10 mg total) by mouth daily with breakfast. ?  ?primidone 50 MG tablet ?Commonly known as: MYSOLINE ?Take 50 mg by mouth at bedtime. ?  ?Trelegy Ellipta 100-62.5-25 MCG/ACT Aepb ?Generic drug: Fluticasone-Umeclidin-Vilant ?Inhale  into the lungs. ?  ? ?  ? ? Follow-up Information   ? ? [name], MD. Schedule an appointment as soon as possible for a visit in 1 week(s).   ?Specialty: Family Medicine ?Why: Hospital Follow Up ?[place] information: ?[place] [address] ?[phone] ? ? ?  ?  ? ?  ?  ? ?  ? ?No Known Allergies ?Allergies as of 03/13/2022   ?No Known Allergies ?  ? ?  ?Medication List  ?  ? ?STOP taking these medications   ? ?Zejula 100 MG capsule ?Generic drug: niraparib tosylate ?  ? ?  ? ?TAKE these medications   ? ?albuterol 108 (90 Base) MCG/ACT inhaler ?Commonly known as: VENTOLIN HFA ?Inhale 2 puffs into the lungs every 6 (six) hours as needed for wheezing or shortness of breath. ?  ?amLODipine 5 MG tablet ?Commonly known as: NORVASC ?Take 5 mg by mouth at bedtime. ?  ?aspirin EC 81 MG tablet ?Take 81 mg by mouth daily. ?  ?atorvastatin 40 MG tablet ?Commonly known as: LIPITOR ?Take 1 tablet (40 mg total) by mouth daily. ?  ?cefdinir 300 MG capsule ?Commonly known  as: OMNICEF ?Take 1 capsule (300 mg total) by mouth 2 (two) times daily for 5 days. ?  ?ferrous sulfate 325 (65 FE) MG tablet ?Take 325 mg by mouth every other day. ?  ?ipratropium 17 MCG/ACT inhaler ?Commonly known as: ATROVENT HFA ?Inhale 2 puffs into the lungs every 6 (six) hours as needed for wheezing. ?What changed:  ?when to take this ?reasons to take this ?  ?levalbuterol 1.25 MG/3ML nebulizer solution ?Commonly known as: XOPENEX ?Inhale into the lungs. ?  ?magnesium oxide 400 (240 Mg) MG tablet ?Commonly known as: MAG-OX ?Take 1 tablet by mouth twice daily ?  ?MUCOMYST-10 IN ?Inhale 1 vial into the lungs every three (3) days as needed (copd). ?  ?nortriptyline 25 MG capsule ?Commonly known as: PAMELOR ?Take 25 mg by mouth at bedtime. ?  ?Omeprazole-Sodium Bicarbonate 20-1100 MG Caps capsule ?Commonly known as: ZEGERID ?Take by mouth. ?  ?OXYGEN ?Inhale 3-4 L into the lungs continuous. ?  ?predniSONE 10 MG tablet ?Commonly known as:  DELTASONE ?Take 1 tablet (10 mg total) by mouth daily with breakfast. ?  ?primidone 50 MG tablet ?Commonly known as: MYSOLINE ?Take 50 mg by mouth at bedtime. ?  ?Trelegy Ellipta 100-62.5-25 MCG/ACT Aepb

## 2022-03-13 NOTE — Care Management Important Message (Signed)
Important Message ? ?Patient Details  ?Name: [name] ?MRN: [id number] ?Date of Birth: Aug 28, 1956 ? ? ?Medicare Important Message Given:  N/A - LOS <3 / Initial given by admissions ? ? ? ? ?Tommy Medal ?03/13/2022, 11:10 AM ?

## 2022-03-13 NOTE — Discharge Instructions (Signed)
IMPORTANT INFORMATION: PAY CLOSE ATTENTION   PHYSICIAN DISCHARGE INSTRUCTIONS  Follow with Primary care provider  Adamo, Elena M, MD  and other consultants as instructed by your Hospitalist Physician  SEEK MEDICAL CARE OR RETURN TO EMERGENCY ROOM IF SYMPTOMS COME BACK, WORSEN OR NEW PROBLEM DEVELOPS   Please note: You were cared for by a hospitalist during your hospital stay. Every effort will be made to forward records to your primary care provider.  You can request that your primary care provider send for your hospital records if they have not received them.  Once you are discharged, your primary care physician will handle any further medical issues. Please note that NO REFILLS for any discharge medications will be authorized once you are discharged, as it is imperative that you return to your primary care physician (or establish a relationship with a primary care physician if you do not have one) for your post hospital discharge needs so that they can reassess your need for medications and monitor your lab values.  Please get a complete blood count and chemistry panel checked by your Primary MD at your next visit, and again as instructed by your Primary MD.  Get Medicines reviewed and adjusted: Please take all your medications with you for your next visit with your Primary MD  Laboratory/radiological data: Please request your Primary MD to go over all hospital tests and procedure/radiological results at the follow up, please ask your primary care provider to get all Hospital records sent to his/her office.  In some cases, they will be blood work, cultures and biopsy results pending at the time of your discharge. Please request that your primary care provider follow up on these results.  If you are diabetic, please bring your blood sugar readings with you to your follow up appointment with primary care.    Please call and make your follow up appointments as soon as possible.    Also Note  the following: If you experience worsening of your admission symptoms, develop shortness of breath, life threatening emergency, suicidal or homicidal thoughts you must seek medical attention immediately by calling 911 or calling your MD immediately  if symptoms less severe.  You must read complete instructions/literature along with all the possible adverse reactions/side effects for all the Medicines you take and that have been prescribed to you. Take any new Medicines after you have completely understood and accpet all the possible adverse reactions/side effects.   Do not drive when taking Pain medications or sleeping medications (Benzodiazepines)  Do not take more than prescribed Pain, Sleep and Anxiety Medications. It is not advisable to combine anxiety,sleep and pain medications without talking with your primary care practitioner  Special Instructions: If you have smoked or chewed Tobacco  in the last 2 yrs please stop smoking, stop any regular Alcohol  and or any Recreational drug use.  Wear Seat belts while driving.  Do not drive if taking any narcotic, mind altering or controlled substances or recreational drugs or alcohol.       

## 2022-03-13 NOTE — Telephone Encounter (Signed)
Called patient and advised she stop Nirparib Noel Journey) until follow up in May. Verbalized understanding. ?

## 2022-03-13 NOTE — Telephone Encounter (Signed)
-----   Message from Derek Jack, MD sent at 03/13/2022 12:43 PM EDT ----- ?Mansel Strother, ?Please call her and let her know to stop Niraparib until she sees [place] back in the clinic.  Thank you. ?----- Message ----- ?From: Murlean Iba, MD ?Sent: 03/13/2022  12:32 PM EDT ?To: Derek Jack, MD ? ?[name],  ?The pharm D asked to hold her niraparib tosylate due to platelets being down to 42.  I got the message after she had discharged home today.  She likely will continue taking it daily.  Let me know if you would like me to call her and ask her to stop it until she follows up in the clinic.   Thank  you ? ?C. Wynetta Emery MD   ? ? ?

## 2022-03-22 ENCOUNTER — Other Ambulatory Visit (HOSPITAL_COMMUNITY): Payer: Self-pay

## 2022-04-01 ENCOUNTER — Inpatient Hospital Stay (HOSPITAL_COMMUNITY): Payer: Medicare HMO | Attending: Hematology

## 2022-04-01 ENCOUNTER — Encounter (HOSPITAL_COMMUNITY): Payer: Self-pay

## 2022-04-01 DIAGNOSIS — D649 Anemia, unspecified: Secondary | ICD-10-CM | POA: Diagnosis not present

## 2022-04-01 DIAGNOSIS — C786 Secondary malignant neoplasm of retroperitoneum and peritoneum: Secondary | ICD-10-CM | POA: Diagnosis not present

## 2022-04-01 DIAGNOSIS — D696 Thrombocytopenia, unspecified: Secondary | ICD-10-CM | POA: Insufficient documentation

## 2022-04-01 DIAGNOSIS — J449 Chronic obstructive pulmonary disease, unspecified: Secondary | ICD-10-CM | POA: Diagnosis not present

## 2022-04-01 DIAGNOSIS — G629 Polyneuropathy, unspecified: Secondary | ICD-10-CM | POA: Insufficient documentation

## 2022-04-01 DIAGNOSIS — C569 Malignant neoplasm of unspecified ovary: Secondary | ICD-10-CM | POA: Diagnosis present

## 2022-04-01 DIAGNOSIS — Z800 Family history of malignant neoplasm of digestive organs: Secondary | ICD-10-CM | POA: Diagnosis not present

## 2022-04-01 DIAGNOSIS — Z87891 Personal history of nicotine dependence: Secondary | ICD-10-CM | POA: Diagnosis not present

## 2022-04-01 DIAGNOSIS — I10 Essential (primary) hypertension: Secondary | ICD-10-CM | POA: Insufficient documentation

## 2022-04-01 LAB — CBC WITH DIFFERENTIAL/PLATELET
Abs Immature Granulocytes: 0.1 10*3/uL — ABNORMAL HIGH (ref 0.00–0.07)
Basophils Absolute: 0.1 10*3/uL (ref 0.0–0.1)
Basophils Relative: 1 %
Eosinophils Absolute: 0.2 10*3/uL (ref 0.0–0.5)
Eosinophils Relative: 2 %
HCT: 32 % — ABNORMAL LOW (ref 36.0–46.0)
Hemoglobin: 9.9 g/dL — ABNORMAL LOW (ref 12.0–15.0)
Immature Granulocytes: 1 %
Lymphocytes Relative: 18 %
Lymphs Abs: 1.5 10*3/uL (ref 0.7–4.0)
MCH: 30 pg (ref 26.0–34.0)
MCHC: 30.9 g/dL (ref 30.0–36.0)
MCV: 97 fL (ref 80.0–100.0)
Monocytes Absolute: 1 10*3/uL (ref 0.1–1.0)
Monocytes Relative: 12 %
Neutro Abs: 5.2 10*3/uL (ref 1.7–7.7)
Neutrophils Relative %: 66 %
Platelets: 217 10*3/uL (ref 150–400)
RBC: 3.3 MIL/uL — ABNORMAL LOW (ref 3.87–5.11)
RDW: 16.1 % — ABNORMAL HIGH (ref 11.5–15.5)
WBC: 7.9 10*3/uL (ref 4.0–10.5)
nRBC: 0 % (ref 0.0–0.2)

## 2022-04-01 LAB — VITAMIN B12: Vitamin B-12: 210 pg/mL (ref 180–914)

## 2022-04-01 LAB — FERRITIN: Ferritin: 667 ng/mL — ABNORMAL HIGH (ref 11–307)

## 2022-04-01 LAB — IRON AND TIBC
Iron: 79 ug/dL (ref 28–170)
Saturation Ratios: 28 % (ref 10.4–31.8)
TIBC: 281 ug/dL (ref 250–450)
UIBC: 202 ug/dL

## 2022-04-01 LAB — COMPREHENSIVE METABOLIC PANEL
ALT: 9 U/L (ref 0–44)
AST: 20 U/L (ref 15–41)
Albumin: 3.5 g/dL (ref 3.5–5.0)
Alkaline Phosphatase: 79 U/L (ref 38–126)
Anion gap: 8 (ref 5–15)
BUN: 9 mg/dL (ref 8–23)
CO2: 34 mmol/L — ABNORMAL HIGH (ref 22–32)
Calcium: 8.7 mg/dL — ABNORMAL LOW (ref 8.9–10.3)
Chloride: 97 mmol/L — ABNORMAL LOW (ref 98–111)
Creatinine, Ser: 0.65 mg/dL (ref 0.44–1.00)
GFR, Estimated: >60 mL/min (ref >60)
Glucose, Bld: 96 mg/dL (ref 70–99)
Potassium: 3.3 mmol/L — ABNORMAL LOW (ref 3.5–5.1)
Sodium: 139 mmol/L (ref 135–145)
Total Bilirubin: 0.4 mg/dL (ref 0.3–1.2)
Total Protein: 6.1 g/dL — ABNORMAL LOW (ref 6.5–8.1)

## 2022-04-01 LAB — MAGNESIUM: Magnesium: 2 mg/dL (ref 1.7–2.4)

## 2022-04-01 LAB — FOLATE: Folate: 9.2 ng/mL (ref >5.9)

## 2022-04-01 MED ORDER — SODIUM CHLORIDE 0.9% FLUSH
10.0000 mL | Freq: Once | INTRAVENOUS | Status: AC
  Administered 2022-04-01: 10 mL via INTRAVENOUS

## 2022-04-01 MED ORDER — HEPARIN SOD (PORK) LOCK FLUSH 100 UNIT/ML IV SOLN
500.0000 [IU] | Freq: Once | INTRAVENOUS | Status: AC
  Administered 2022-04-01: 500 [IU] via INTRAVENOUS

## 2022-04-01 NOTE — Patient Instructions (Signed)
[facility]  Discharge Instructions: Thank you for choosing [facility] to provide your oncology and hematology care.  If you have a lab appointment with the Cancer Center, please come in thru the Main Entrance and check in at the main information desk.  Wear comfortable clothing and clothing appropriate for easy access to any Portacath or PICC line.   We strive to give you quality time with your provider. You may need to reschedule your appointment if you arrive late (15 or more minutes).  Arriving late affects you and other patients whose appointments are after yours.  Also, if you miss three or more appointments without notifying the office, you may be dismissed from the clinic at the provider's discretion.      For prescription refill requests, have your pharmacy contact our office and allow 72 hours for refills to be completed.        To help prevent nausea and vomiting after your treatment, we encourage you to take your nausea medication as directed.  BELOW ARE SYMPTOMS THAT SHOULD BE REPORTED IMMEDIATELY: *FEVER GREATER THAN 100.4 F (38 C) OR HIGHER *CHILLS OR SWEATING *NAUSEA AND VOMITING THAT IS NOT CONTROLLED WITH YOUR NAUSEA MEDICATION *UNUSUAL SHORTNESS OF BREATH *UNUSUAL BRUISING OR BLEEDING *URINARY PROBLEMS (pain or burning when urinating, or frequent urination) *BOWEL PROBLEMS (unusual diarrhea, constipation, pain near the anus) TENDERNESS IN MOUTH AND THROAT WITH OR WITHOUT PRESENCE OF ULCERS (sore throat, sores in mouth, or a toothache) UNUSUAL RASH, SWELLING OR PAIN  UNUSUAL VAGINAL DISCHARGE OR ITCHING   Items with * indicate a potential emergency and should be followed up as soon as possible or go to the Emergency Department if any problems should occur.  Please show the CHEMOTHERAPY ALERT CARD or IMMUNOTHERAPY ALERT CARD at check-in to the Emergency Department and triage nurse.  Should you have questions after your visit or need to cancel  or reschedule your appointment, please contact [facility] [phone]  and follow the prompts.  Office hours are 8:00 a.m. to 4:30 p.m. Monday - Friday. Please note that voicemails left after 4:00 p.m. may not be returned until the following business day.  We are closed weekends and major holidays. You have access to a nurse at all times for urgent questions. Please call the main number to the clinic [phone] and follow the prompts.  For any non-urgent questions, you may also contact your provider using MyChart. We now offer e-Visits for anyone 18 and older to request care online for non-urgent symptoms. For details visit [email or web address].   Also download the MyChart app! Go to the app store, search "MyChart", open the app, select [facility], and log in with your MyChart username and password.  Due to Covid, a mask is required upon entering the hospital/clinic. If you do not have a mask, one will be given to you upon arrival. For doctor visits, patients may have 1 support person aged 18 or older with them. For treatment visits, patients cannot have anyone with them due to current Covid guidelines and our immunocompromised population.  

## 2022-04-01 NOTE — Progress Notes (Signed)
Labs from port.  Patients port flushed without difficulty.  Good blood return noted with no bruising or swelling noted at site.  Band aid applied.  VSS with discharge and left in satisfactory condition with no s/s of distress noted.   ?

## 2022-04-02 LAB — CA 125: Cancer Antigen (CA) 125: 36.2 U/mL (ref 0.0–38.1)

## 2022-04-08 ENCOUNTER — Inpatient Hospital Stay (HOSPITAL_COMMUNITY): Payer: Medicare HMO | Admitting: Hematology

## 2022-04-08 VITALS — BP 140/77 | HR 100 | Temp 97.9°F | Resp 18 | Ht <= 58 in | Wt 123.5 lb

## 2022-04-08 DIAGNOSIS — C569 Malignant neoplasm of unspecified ovary: Secondary | ICD-10-CM

## 2022-04-08 NOTE — Progress Notes (Signed)
[place], [place] [address]   CLINIC:  Medical Oncology/Hematology  PCP:  [name], [place] / [place] [address] [phone]   REASON FOR VISIT:  Follow-up for high-grade serous ovarian carcinoma  PRIOR THERAPY: none  NGS Results: BRCA 1/2 negative  CURRENT THERAPY: Carboplatin, paclitaxel & Aloxi every 3 weeks  BRIEF ONCOLOGIC HISTORY:  Oncology History  Peritoneal carcinomatosis ([place])  06/06/2020 Initial Diagnosis   Peritoneal carcinomatosis ([place])    07/05/2020 -  Chemotherapy    Patient is on Treatment Plan: OVARIAN CARBOPLATIN (AUC 6) / PACLITAXEL (175) Q21D X 6 CYCLES       07/22/2020 Genetic Testing   Negative genetic testing: no pathogenic variants detected in [facility] Expanded CancerNext Panel + RNAInsight. The CancerNext-Expanded gene panel offered by [facility] and includes sequencing and rearrangement analysis for the following 77 genes: AIP, ALK, APC*, ATM*, AXIN2, BAP1, BARD1, BLM, BMPR1A, BRCA1*, BRCA2*, BRIP1*, CDC73, CDH1*, CDK4, CDKN1B, CDKN2A, CHEK2*, CTNNA1, DICER1, FANCC, FH, FLCN, GALNT12, KIF1B, LZTR1, MAX, MEN1, MET, MLH1*, MSH2*, MSH3, MSH6*, MUTYH*, NBN, NF1*, NF2, NTHL1, PALB2*, PHOX2B, PMS2*, POT1, PRKAR1A, PTCH1, PTEN*, RAD51C*, RAD51D*, RB1, RECQL, RET, SDHA, SDHAF2, SDHB, SDHC, SDHD, SMAD4, SMARCA4, SMARCB1, SMARCE1, STK11, SUFU, TMEM127, TP53*, TSC1, TSC2, VHL and XRCC2 (sequencing and deletion/duplication); EGFR, EGLN1, HOXB13, KIT, MITF, PDGFRA, POLD1, and POLE (sequencing only); EPCAM and GREM1 (deletion/duplication only). DNA and RNA analyses performed for * genes. The report date is July 22, 2020.    Ovarian cancer ([place])  07/07/2020 Initial Diagnosis   Ovarian cancer ([place])   05/04/2021 Imaging   1. No findings today to suggest new or progressive disease related to ovarian cancer. 2. Interval near complete resolution of the irregular and nodular areas of consolidative left upper  lobe opacity seen on the previous study. Residual architectural distortion in this region is compatible with scarring. Tiny nodules persist in the left upper lobe and attention on follow-up recommended. 3. Interval improvement in tree-in-bud nodularity anteromedial left upper lobe. Changes of bronchial wall thickening and mild cylindrical bronchiectasis in the lower lungs are similar to prior with persistent tree-in-bud nodularity posterior left lower lobe. Imaging features suggest sequelae of atypical infection. 4. Similar appearance of the omental soft tissue stranding with less prominent nodularity on today's study. 5. No ascites. 6. Mild circumferential wall thickening distal esophagus. Esophagitis could have this appearance. 7. Left colonic diverticulosis without diverticulitis. 8. Aortic Atherosclerosis (ICD10-I70.0) and Emphysema (ICD10-J43.9).   05/30/2021 -  Chemotherapy   [name] takes niraparib         CANCER STAGING: Cancer Staging  No matching staging information was found for the patient.  INTERVAL HISTORY:  [name], a 66 y.o. female, returns for routine follow-up of her high-grade serous ovarian carcinoma. [name] was last seen on 01/29/2022.   Today [name] reports feeling good. [name] was stopped when [name] was admitted to the hospital on 4/23 due to a UTI, and [name] has not resumed taking them. [name] denies abdominal pain and cramping. [name] stopped Gabapentin and started primidone.   REVIEW OF SYSTEMS:  Review of Systems  Constitutional:  Negative for appetite change and fatigue.  Respiratory:  Positive for shortness of breath.   Gastrointestinal:  Negative for abdominal pain.  All other systems reviewed and are negative.  PAST MEDICAL/SURGICAL HISTORY:  Past Medical History:  Diagnosis Date   Anemia    Aortic atherosclerosis ([place])    Arthritis    Asthma  Bilateral carotid artery stenosis    Cancer (HCC)    Gastric Cancer   COPD (chronic obstructive pulmonary  disease) (HCC)    High cholesterol    Hyperlipidemia    Hypertension    Neuropathy    Osteoarthritis    Ovarian cancer ([place])    Oxygen dependent    Peripheral vascular disease ([place])    Peritoneal carcinoma ([place])    Pneumonia 2019   Port-A-Cath in place 07/03/2020   Pulmonary emphysema ([place])    Past Surgical History:  Procedure Laterality Date   ESOPHAGOGASTRODUODENOSCOPY (EGD) WITH PROPOFOL N/A 02/17/2021   Procedure: ESOPHAGOGASTRODUODENOSCOPY (EGD) WITH PROPOFOL;  Surgeon: Harvel Quale, MD;  [facility];  Service: Gastroenterology;  Laterality: N/A;   HALLUX VALGUS BASE WEDGE Right 06/09/2015   Procedure: Base wedge osteotomy with modified McBride right foot ;  Surgeon: Sharlotte Alamo, MD;  Location: [facility];  Service: Podiatry;  Laterality: Right;   PORTACATH PLACEMENT Left 06/28/2020   Procedure: PORT-A-CATHETER PLACEMENT LEFT CHEST (attached catheter in left subclavian);  Surgeon: Virl Cagey, MD;  Location: [facility];  Service: General;  Laterality: Left;   TUBAL LIGATION     VIDEO BRONCHOSCOPY WITH ENDOBRONCHIAL NAVIGATION N/A 03/09/2021   Procedure: VIDEO BRONCHOSCOPY WITH ENDOBRONCHIAL NAVIGATION;  Surgeon: Ottie Glazier, MD;  Location: [facility];  Service: Thoracic;  Laterality: N/A;   VIDEO BRONCHOSCOPY WITH ENDOBRONCHIAL ULTRASOUND N/A 03/09/2021   Procedure: VIDEO BRONCHOSCOPY WITH ENDOBRONCHIAL ULTRASOUND;  Surgeon: Ottie Glazier, MD;  Location: [facility];  Service: Thoracic;  Laterality: N/A;    SOCIAL HISTORY:  Social History   Socioeconomic History   Marital status: Divorced    Spouse name: Not on file   Number of children: 3   Years of education: Not on file   Highest education level: Not on file  Occupational History   Occupation: DISABLED  Tobacco Use   Smoking status: Former    Packs/day: 2.00    Years: 30.00    Pack years: 60.00    Types: Cigarettes    Quit date: 11/04/2013    Years since quitting: 8.4   Smokeless tobacco:  Never  Vaping Use   Vaping Use: Never used  Substance and Sexual Activity   Alcohol use: No   Drug use: No   Sexual activity: Not Currently  Other Topics Concern   Not on file  Social History Narrative   Not on file   Social Determinants of Health   Financial Resource Strain: Not on file  Food Insecurity: Not on file  Transportation Needs: Not on file  Physical Activity: Not on file  Stress: Not on file  Social Connections: Not on file  Intimate Partner Violence: Not on file    FAMILY HISTORY:  Family History  Problem Relation Age of Onset   Alzheimer's disease Mother    COPD Father    Emphysema Father    Hypertension Father    Healthy Sister    Healthy Brother    Alzheimer's disease Maternal Grandmother    Healthy Sister    Healthy Sister    Healthy Sister    Prostate cancer Other        paternal grandmother's brother; dx in early 43s   Breast cancer Neg Hx     CURRENT MEDICATIONS:  Current Outpatient Medications  Medication Sig Dispense Refill   Acetylcysteine (MUCOMYST-10 IN) Inhale 1 vial into the lungs every three (3) days as needed (copd).     albuterol (VENTOLIN HFA) 108 (90 Base)  MCG/ACT inhaler Inhale 2 puffs into the lungs every 6 (six) hours as needed for wheezing or shortness of breath.     amLODipine (NORVASC) 5 MG tablet Take 5 mg by mouth at bedtime.      aspirin EC 81 MG tablet Take 81 mg by mouth daily.     atorvastatin (LIPITOR) 40 MG tablet Take 1 tablet (40 mg total) by mouth daily. 30 tablet 1   ferrous sulfate 325 (65 FE) MG tablet Take 325 mg by mouth every other day.     Fluticasone-Umeclidin-Vilant (TRELEGY ELLIPTA) 100-62.5-25 MCG/ACT AEPB Inhale into the lungs.     ipratropium (ATROVENT HFA) 17 MCG/ACT inhaler Inhale 2 puffs into the lungs every 6 (six) hours as needed for wheezing. 1 each 12   levalbuterol (XOPENEX) 1.25 MG/3ML nebulizer solution Inhale into the lungs.     magnesium oxide (MAG-OX) 400 (240 Mg) MG tablet Take 1 tablet by  mouth twice daily (Patient taking differently: Take 400 mg by mouth 2 (two) times daily.) 120 tablet 3   nortriptyline (PAMELOR) 25 MG capsule Take 25 mg by mouth at bedtime.     Omeprazole-Sodium Bicarbonate (ZEGERID) 20-1100 MG CAPS capsule Take by mouth.     OXYGEN Inhale 3-4 L into the lungs continuous.     predniSONE (DELTASONE) 10 MG tablet Take 1 tablet (10 mg total) by mouth daily with breakfast.     primidone (MYSOLINE) 50 MG tablet Take 50 mg by mouth at bedtime.     No current facility-administered medications for this visit.    ALLERGIES:  No Known Allergies  PHYSICAL EXAM:  Performance status (ECOG): 1 - Symptomatic but completely ambulatory  There were no vitals filed for this visit. Wt Readings from Last 3 Encounters:  03/10/22 125 lb (56.7 kg)  02/19/22 123 lb 0.3 oz (55.8 kg)  02/18/22 124 lb (56.2 kg)   Physical Exam Vitals reviewed.  Constitutional:      Appearance: Normal appearance.     Interventions: Nasal cannula in place.     Comments: In wheelchair  Cardiovascular:     Rate and Rhythm: Normal rate and regular rhythm.     Pulses: Normal pulses.     Heart sounds: Normal heart sounds.  Pulmonary:     Effort: Pulmonary effort is normal.     Comments: Hoarse breath sounds at bases bilaterally Abdominal:     Palpations: Abdomen is soft. There is no mass.     Tenderness: There is no abdominal tenderness.  Neurological:     General: No focal deficit present.     Mental Status: [name] is alert and oriented to person, place, and time.  Psychiatric:        Mood and Affect: Mood normal.        Behavior: Behavior normal.     LABORATORY DATA:  I have reviewed the labs as listed.     Latest Ref Rng & Units 04/01/2022    2:07 PM 03/13/2022    4:53 AM 03/12/2022    4:52 AM  CBC  WBC 4.0 - 10.5 K/uL 7.9   2.4   3.3    Hemoglobin 12.0 - 15.0 g/dL 9.9   8.8   8.7    Hematocrit 36.0 - 46.0 % 32.0   26.9   26.5    Platelets 150 - 400 K/uL 217   42   41         Latest Ref Rng & Units 04/01/2022    2:07 PM 03/12/2022  4:52 AM 03/11/2022    5:00 AM  CMP  Glucose 70 - 99 mg/dL 96   94   108    BUN 8 - 23 mg/dL '9   7   10    ' Creatinine 0.44 - 1.00 mg/dL 0.65   0.44   0.56    Sodium 135 - 145 mmol/L 139   137   134    Potassium 3.5 - 5.1 mmol/L 3.3   3.6   3.3    Chloride 98 - 111 mmol/L 97   99   96    CO2 22 - 32 mmol/L 34   31   31    Calcium 8.9 - 10.3 mg/dL 8.7   8.1   7.9    Total Protein 6.5 - 8.1 g/dL 6.1      Total Bilirubin 0.3 - 1.2 mg/dL 0.4      Alkaline Phos 38 - 126 U/L 79      AST 15 - 41 U/L 20      ALT 0 - 44 U/L 9        DIAGNOSTIC IMAGING:  I have independently reviewed the scans and discussed with the patient. DG Chest 1 View  Result Date: 03/10/2022 CLINICAL DATA:  Sepsis, shortness of breath EXAM: CHEST  1 VIEW COMPARISON:  03/10/2022 FINDINGS: Left Port-A-Cath remains in place, unchanged. Heart is normal size. Scarring at the left lung base and in the apices. No acute confluent airspace opacities or effusions. No acute bony abnormality. IMPRESSION: No active disease. Electronically Signed   By: Rolm Baptise M.D.   On: 03/10/2022 21:04   CT ABDOMEN PELVIS W CONTRAST  Result Date: 03/10/2022 CLINICAL DATA:  Left-sided abdominal pain and fever. EXAM: CT ABDOMEN AND PELVIS WITH CONTRAST TECHNIQUE: Multidetector CT imaging of the abdomen and pelvis was performed using the standard protocol following bolus administration of intravenous contrast. RADIATION DOSE REDUCTION: This exam was performed according to the departmental dose-optimization program which includes automated exposure control, adjustment of the mA and/or kV according to patient size and/or use of iterative reconstruction technique. CONTRAST:  161m OMNIPAQUE IOHEXOL 300 MG/ML  SOLN COMPARISON:  January 28, 2022 FINDINGS: Lower chest: Left lower lobe bronchiectasis. Hepatobiliary: Stable less than 5 mm hypoattenuated lesion in the periphery of the right lobe of the liver.  No gallstones, gallbladder wall thickening, or biliary dilatation. Pancreas: Unremarkable. No pancreatic ductal dilatation or surrounding inflammatory changes. Spleen: Normal in size without focal abnormality. Adrenals/Urinary Tract: Adrenal glands are unremarkable. Kidneys are normal, without renal calculi, focal lesion, or hydronephrosis. Bladder is decompressed. Stomach/Bowel: Small hiatal hernia. Stomach is within normal limits. Appendix appears normal. No evidence of bowel wall thickening, distention, or inflammatory changes. There is scattered left colonic diverticulosis without evidence of acute diverticulitis. Vascular/Lymphatic: Aortic atherosclerosis. No enlarged abdominal or pelvic lymph nodes. Reproductive: Large partially calcified myometrial mass within the fundus of the uterus measures 5.4 cm. No adnexal masses. Other: Omental fat stranding and subtle nodularity in the right and central anterior abdomen, adjacent to the transverse colon persist. Musculoskeletal: Mild spondylosis of the lumbosacral spine. IMPRESSION: 1. Persistent omental fat stranding and subtle nodularity in the right and central anterior abdomen, adjacent to the transverse colon, thought to represent omental carcinomatosis. 2. Stable less than 5 mm hypoattenuated lesion in the periphery of the right lobe of the liver. 3. Small hiatal hernia. 4. Left colonic diverticulosis without evidence of acute diverticulitis. 5. Large partially calcified myometrial mass within the fundus of the uterus  measures 5.4 cm, stable. 6. Aortic atherosclerosis. Aortic Atherosclerosis (ICD10-I70.0). Electronically Signed   By: Fidela Salisbury M.D.   On: 03/10/2022 12:18   DG Chest Port 1 View  Result Date: 03/10/2022 CLINICAL DATA:  Shortness of breath and fever. EXAM: PORTABLE CHEST 1 VIEW COMPARISON:  April 3rd 2023 FINDINGS: The heart size and mediastinal contours are stable. Left central venous line is unchanged. Mild scar is identified in the  left lung base. Both lungs are otherwise clear. The visualized skeletal structures are unremarkable. IMPRESSION: No active cardiopulmonary disease. Electronically Signed   By: Abelardo Diesel M.D.   On: 03/10/2022 11:12     ASSESSMENT:  1.  Peritoneal carcinomatosis: -Presentation to the ER with right lower quadrant abdominal pain on and off for 1 month. -CTAP on 05/29/2020 showed extensive nodularity throughout the omentum and upper peritoneal cavity.  Both ovaries are prominent although no well-defined mass is noted.  Small volume ascites. -CA-125 is elevated at 2407.  CEA was 2.4. -CT of the chest shows 11 mm right paratracheal lymph node and 11 mm short axis precarinal lymph node.  Subcarinal lymphadenopathy measuring 14 mm short axis.  This is suspicious for metastatic disease. 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 -Cycle 1 of carboplatin and paclitaxel on 07/05/2020. -Evaluated by Dr. Denman George on 07/07/2020.  Reevaluation after 3 cycles. -CT CAP from 09/01/2020 showed mild improvement with reduction in the omental caking.  Stable to minimally reduced adenopathy in the chest and abdomen.  New 4 mm right upper lobe lung nodule could be inflammatory. -CT CAP on 11/07/2019 and after 6 cycles showed improved peritoneal disease/omental caking.  9 mm short axis portacaval lymph node and adjacent upper abdominal lymph nodes measuring 12 mm in short axis.  Small retroperitoneal lymph nodes up to 5 mm improved.  Peritoneal disease/omental caking now measures 2.4 cm in thickness. -CA-125 on 10/18/2020 improved 52. -Plan for surgical resection after 6 cycles.  Unfortunately [name] was not cleared for surgery by anesthesia/pulmonary. - CT CAP on 02/12/2021 showed peritoneal disease and abdominal lymph nodes have improved.  However new masslike areas in the left  upper lobe, new since January 31 CT scan along with borderline enlargement of the left supraclavicular lymph node. - [name] underwent bronchoscopy and biopsy on 03/09/2021 which was benign. - Niraparib 200 mg daily started around 05/25/2021.   2.  COPD: -Quit smoking 6 years ago.  Smoked 2 packs/day for 25-30 years. -Has been on 3 L/min oxygen via nasal cannula for the last 5 years.   3.  Family history: -Paternal grandmother with colon cancer.   PLAN:  1.  High-grade serous ovarian carcinoma: - [name] was recently hospitalized due to UTI sepsis. - I have reviewed hospitalization records. - Reviewed CTAP (03/10/2022): Persistent omental fat stranding and subtle nodularity in the right and central anterior abdomen, adjacent to the transverse colon consistent with omental carcinomatosis.  Stable less than 5 mm hypoattenuating lesion in the periphery of the right lower liver.  Left colonic diverticulosis. - Niraparib was held at the time of discharge due to thrombocytopenia of 41. - Reviewed labs from 04/01/2022 which showed CA125 of was 36 and normal.  CBC shows normocytic anemia with ferritin 667 and percent saturation 28 and Z76 and folic acid normal.  LFTs are normal. - I have recommended restarting niraparib 200 mg daily. - RTC 2 months for follow-up.  Plan to repeat  CTAP with contrast and CA125 and other labs.    2.  Family history: - Germline mutation testing was negative.   3.  Peripheral neuropathy: - Gabapentin was discontinued.  [name] was reportedly started on primidone.   4.  Severe COPD: - Continue prednisone, Trelegy and Xopenex.   5.  Hypomagnesemia: - Continue magnesium twice daily.  Magnesium is normal.   Orders placed this encounter:  No orders of the defined types were placed in this encounter.    Derek Jack, MD [place] [phone]   I, Thana Ates, am acting as a scribe for Dr. Derek Jack.  I, Derek Jack MD, have  reviewed the above documentation for accuracy and completeness, and I agree with the above.

## 2022-04-08 NOTE — Patient Instructions (Addendum)
[place] at [facility] Discharge Instructions  You were seen and examined today by [name].  [name] discussed your most recent lab work and everything looks stable. Start taking the Niraparib tosylate Noel Journey) again.  [name] will get you scheduled for repeat CT abdomen and pelvis.  Follow-up as scheduled in 2 months.    Thank you for choosing [place] at [facility] to provide your oncology and hematology care.  To afford each patient quality time with our provider, please arrive at least 15 minutes before your scheduled appointment time.   If you have a lab appointment with the [facility] please come in thru the Main Entrance and check in at the main information desk.  You need to re-schedule your appointment should you arrive 10 or more minutes late.  We strive to give you quality time with our providers, and arriving late affects you and other patients whose appointments are after yours.  Also, if you no show three or more times for appointments you may be dismissed from the clinic at the providers discretion.     Again, thank you for choosing [facility].  Our hope is that these requests will decrease the amount of time that you wait before being seen by our physicians.       _____________________________________________________________  Should you have questions after your visit to [facility], please contact our office at [phone] and follow the prompts.  Our office hours are 8:00 a.m. and 4:30 p.m. Monday - Friday.  Please note that voicemails left after 4:00 p.m. may not be returned until the following business day.  We are closed weekends and major holidays.  You do have access to a nurse 24-7, just call the main number to the clinic [phone] and do not press any options, hold on the line and a nurse will answer the phone.    For prescription refill requests, have your  pharmacy contact our office and allow 72 hours.    Due to Covid, you will need to wear a mask upon entering the hospital. If you do not have a mask, a mask will be given to you at the Main Entrance upon arrival. For doctor visits, patients may have 1 support person age 107 or older with them. For treatment visits, patients can not have anyone with them due to social distancing guidelines and our immunocompromised population.

## 2022-04-09 ENCOUNTER — Other Ambulatory Visit (HOSPITAL_COMMUNITY): Payer: Self-pay

## 2022-04-10 ENCOUNTER — Other Ambulatory Visit (HOSPITAL_COMMUNITY): Payer: Self-pay | Admitting: Hematology and Oncology

## 2022-04-10 ENCOUNTER — Other Ambulatory Visit (HOSPITAL_COMMUNITY): Payer: Self-pay

## 2022-04-11 ENCOUNTER — Other Ambulatory Visit (HOSPITAL_COMMUNITY): Payer: Self-pay | Admitting: Hematology

## 2022-04-11 ENCOUNTER — Other Ambulatory Visit (HOSPITAL_COMMUNITY): Payer: Self-pay

## 2022-04-11 ENCOUNTER — Encounter (HOSPITAL_COMMUNITY): Payer: Self-pay | Admitting: Hematology

## 2022-04-12 ENCOUNTER — Other Ambulatory Visit (HOSPITAL_COMMUNITY): Payer: Self-pay

## 2022-04-12 ENCOUNTER — Encounter (HOSPITAL_COMMUNITY): Payer: Self-pay | Admitting: Hematology

## 2022-04-12 MED ORDER — ZEJULA 100 MG PO CAPS
200.0000 mg | ORAL_CAPSULE | Freq: Every day | ORAL | 11 refills | Status: DC
  Filled 2022-04-12: qty 60, 30d supply, fill #0
  Filled 2022-06-03: qty 60, 30d supply, fill #1

## 2022-04-18 ENCOUNTER — Other Ambulatory Visit (HOSPITAL_COMMUNITY): Payer: Self-pay

## 2022-05-03 ENCOUNTER — Inpatient Hospital Stay (HOSPITAL_COMMUNITY)
Admission: EM | Admit: 2022-05-03 | Discharge: 2022-05-05 | DRG: 369 | Disposition: A | Discharge status: Home / Self Care | Payer: Medicare HMO | Attending: Internal Medicine | Admitting: Internal Medicine

## 2022-05-03 ENCOUNTER — Other Ambulatory Visit: Payer: Self-pay

## 2022-05-03 ENCOUNTER — Encounter (HOSPITAL_COMMUNITY): Payer: Self-pay

## 2022-05-03 ENCOUNTER — Emergency Department (HOSPITAL_COMMUNITY): Payer: Medicare HMO

## 2022-05-03 DIAGNOSIS — I739 Peripheral vascular disease, unspecified: Secondary | ICD-10-CM | POA: Diagnosis present

## 2022-05-03 DIAGNOSIS — D509 Iron deficiency anemia, unspecified: Secondary | ICD-10-CM | POA: Diagnosis present

## 2022-05-03 DIAGNOSIS — Z8543 Personal history of malignant neoplasm of ovary: Secondary | ICD-10-CM | POA: Diagnosis not present

## 2022-05-03 DIAGNOSIS — I10 Essential (primary) hypertension: Secondary | ICD-10-CM | POA: Diagnosis present

## 2022-05-03 DIAGNOSIS — Z7982 Long term (current) use of aspirin: Secondary | ICD-10-CM

## 2022-05-03 DIAGNOSIS — G629 Polyneuropathy, unspecified: Secondary | ICD-10-CM | POA: Diagnosis present

## 2022-05-03 DIAGNOSIS — E876 Hypokalemia: Secondary | ICD-10-CM | POA: Diagnosis not present

## 2022-05-03 DIAGNOSIS — D500 Iron deficiency anemia secondary to blood loss (chronic): Secondary | ICD-10-CM | POA: Diagnosis present

## 2022-05-03 DIAGNOSIS — Z8701 Personal history of pneumonia (recurrent): Secondary | ICD-10-CM

## 2022-05-03 DIAGNOSIS — K921 Melena: Secondary | ICD-10-CM | POA: Diagnosis present

## 2022-05-03 DIAGNOSIS — Z79899 Other long term (current) drug therapy: Secondary | ICD-10-CM | POA: Diagnosis not present

## 2022-05-03 DIAGNOSIS — Z85028 Personal history of other malignant neoplasm of stomach: Secondary | ICD-10-CM | POA: Diagnosis not present

## 2022-05-03 DIAGNOSIS — Z9981 Dependence on supplemental oxygen: Secondary | ICD-10-CM | POA: Diagnosis not present

## 2022-05-03 DIAGNOSIS — Z8249 Family history of ischemic heart disease and other diseases of the circulatory system: Secondary | ICD-10-CM

## 2022-05-03 DIAGNOSIS — J9611 Chronic respiratory failure with hypoxia: Secondary | ICD-10-CM | POA: Diagnosis present

## 2022-05-03 DIAGNOSIS — D539 Nutritional anemia, unspecified: Secondary | ICD-10-CM | POA: Diagnosis present

## 2022-05-03 DIAGNOSIS — K920 Hematemesis: Secondary | ICD-10-CM

## 2022-05-03 DIAGNOSIS — C569 Malignant neoplasm of unspecified ovary: Secondary | ICD-10-CM | POA: Diagnosis present

## 2022-05-03 DIAGNOSIS — R195 Other fecal abnormalities: Secondary | ICD-10-CM | POA: Diagnosis not present

## 2022-05-03 DIAGNOSIS — K449 Diaphragmatic hernia without obstruction or gangrene: Secondary | ICD-10-CM | POA: Diagnosis present

## 2022-05-03 DIAGNOSIS — R042 Hemoptysis: Principal | ICD-10-CM

## 2022-05-03 DIAGNOSIS — Z7951 Long term (current) use of inhaled steroids: Secondary | ICD-10-CM | POA: Diagnosis not present

## 2022-05-03 DIAGNOSIS — J439 Emphysema, unspecified: Secondary | ICD-10-CM | POA: Diagnosis present

## 2022-05-03 DIAGNOSIS — Z825 Family history of asthma and other chronic lower respiratory diseases: Secondary | ICD-10-CM

## 2022-05-03 DIAGNOSIS — K2101 Gastro-esophageal reflux disease with esophagitis, with bleeding: Principal | ICD-10-CM | POA: Diagnosis present

## 2022-05-03 DIAGNOSIS — I6523 Occlusion and stenosis of bilateral carotid arteries: Secondary | ICD-10-CM | POA: Diagnosis present

## 2022-05-03 DIAGNOSIS — I700 Atherosclerosis of aorta: Secondary | ICD-10-CM | POA: Diagnosis present

## 2022-05-03 DIAGNOSIS — M1990 Unspecified osteoarthritis, unspecified site: Secondary | ICD-10-CM | POA: Diagnosis present

## 2022-05-03 DIAGNOSIS — C786 Secondary malignant neoplasm of retroperitoneum and peritoneum: Secondary | ICD-10-CM | POA: Diagnosis present

## 2022-05-03 DIAGNOSIS — Z87891 Personal history of nicotine dependence: Secondary | ICD-10-CM

## 2022-05-03 DIAGNOSIS — Z9851 Tubal ligation status: Secondary | ICD-10-CM

## 2022-05-03 DIAGNOSIS — E7800 Pure hypercholesterolemia, unspecified: Secondary | ICD-10-CM | POA: Diagnosis present

## 2022-05-03 DIAGNOSIS — Z7952 Long term (current) use of systemic steroids: Secondary | ICD-10-CM | POA: Diagnosis not present

## 2022-05-03 DIAGNOSIS — K922 Gastrointestinal hemorrhage, unspecified: Secondary | ICD-10-CM | POA: Diagnosis present

## 2022-05-03 DIAGNOSIS — J449 Chronic obstructive pulmonary disease, unspecified: Secondary | ICD-10-CM | POA: Diagnosis present

## 2022-05-03 LAB — CBC WITH DIFFERENTIAL/PLATELET
Abs Immature Granulocytes: 0.04 10*3/uL (ref 0.00–0.07)
Basophils Absolute: 0.1 10*3/uL (ref 0.0–0.1)
Basophils Relative: 1 %
Eosinophils Absolute: 0.1 10*3/uL (ref 0.0–0.5)
Eosinophils Relative: 2 %
HCT: 29 % — ABNORMAL LOW (ref 36.0–46.0)
Hemoglobin: 9.2 g/dL — ABNORMAL LOW (ref 12.0–15.0)
Immature Granulocytes: 1 %
Lymphocytes Relative: 10 %
Lymphs Abs: 0.8 10*3/uL (ref 0.7–4.0)
MCH: 31.5 pg (ref 26.0–34.0)
MCHC: 31.7 g/dL (ref 30.0–36.0)
MCV: 99.3 fL (ref 80.0–100.0)
Monocytes Absolute: 1 10*3/uL (ref 0.1–1.0)
Monocytes Relative: 13 %
Neutro Abs: 5.6 10*3/uL (ref 1.7–7.7)
Neutrophils Relative %: 73 %
Platelets: 201 10*3/uL (ref 150–400)
RBC: 2.92 MIL/uL — ABNORMAL LOW (ref 3.87–5.11)
RDW: 15.9 % — ABNORMAL HIGH (ref 11.5–15.5)
WBC: 7.6 10*3/uL (ref 4.0–10.5)
nRBC: 0 % (ref 0.0–0.2)

## 2022-05-03 LAB — CBC
HCT: 28 % — ABNORMAL LOW (ref 36.0–46.0)
Hemoglobin: 8.9 g/dL — ABNORMAL LOW (ref 12.0–15.0)
MCH: 30.9 pg (ref 26.0–34.0)
MCHC: 31.8 g/dL (ref 30.0–36.0)
MCV: 97.2 fL (ref 80.0–100.0)
Platelets: 187 10*3/uL (ref 150–400)
RBC: 2.88 MIL/uL — ABNORMAL LOW (ref 3.87–5.11)
RDW: 15.9 % — ABNORMAL HIGH (ref 11.5–15.5)
WBC: 8.1 10*3/uL (ref 4.0–10.5)
nRBC: 0 % (ref 0.0–0.2)

## 2022-05-03 LAB — COMPREHENSIVE METABOLIC PANEL
ALT: 11 U/L (ref 0–44)
AST: 22 U/L (ref 15–41)
Albumin: 3.4 g/dL — ABNORMAL LOW (ref 3.5–5.0)
Alkaline Phosphatase: 77 U/L (ref 38–126)
Anion gap: 9 (ref 5–15)
BUN: 17 mg/dL (ref 8–23)
CO2: 35 mmol/L — ABNORMAL HIGH (ref 22–32)
Calcium: 8.9 mg/dL (ref 8.9–10.3)
Chloride: 94 mmol/L — ABNORMAL LOW (ref 98–111)
Creatinine, Ser: 0.71 mg/dL (ref 0.44–1.00)
GFR, Estimated: >60 mL/min (ref >60)
Glucose, Bld: 115 mg/dL — ABNORMAL HIGH (ref 70–99)
Potassium: 3.7 mmol/L (ref 3.5–5.1)
Sodium: 138 mmol/L (ref 135–145)
Total Bilirubin: 0.2 mg/dL — ABNORMAL LOW (ref 0.3–1.2)
Total Protein: 6.1 g/dL — ABNORMAL LOW (ref 6.5–8.1)

## 2022-05-03 LAB — TYPE AND SCREEN
ABO/RH(D): O NEG
Antibody Screen: NEGATIVE

## 2022-05-03 LAB — POC OCCULT BLOOD, ED: Fecal Occult Bld: POSITIVE — AB

## 2022-05-03 LAB — LIPASE, BLOOD: Lipase: 23 U/L (ref 11–51)

## 2022-05-03 MED ORDER — ALBUTEROL SULFATE HFA 108 (90 BASE) MCG/ACT IN AERS: 2.0000 | INHALATION_SPRAY | Freq: Four times a day (QID) | RESPIRATORY_TRACT | Status: DC | PRN

## 2022-05-03 MED ORDER — IOHEXOL 350 MG/ML SOLN
75.0000 mL | Freq: Once | INTRAVENOUS | Status: AC | PRN
  Administered 2022-05-03: 75 mL via INTRAVENOUS

## 2022-05-03 MED ORDER — ALBUTEROL SULFATE (2.5 MG/3ML) 0.083% IN NEBU
2.5000 mg | INHALATION_SOLUTION | Freq: Four times a day (QID) | RESPIRATORY_TRACT | Status: DC | PRN
Start: 2022-05-03 — End: 2022-05-05

## 2022-05-03 MED ORDER — PANTOPRAZOLE SODIUM 40 MG IV SOLR
40.0000 mg | Freq: Two times a day (BID) | INTRAVENOUS | Status: DC
  Administered 2022-05-04 – 2022-05-05 (×4): 40 mg via INTRAVENOUS
  Filled 2022-05-03 (×4): qty 10

## 2022-05-03 MED ORDER — NIRAPARIB TOSYLATE 100 MG PO CAPS: 200.0000 mg | ORAL_CAPSULE | Freq: Every day | ORAL | Status: DC

## 2022-05-03 MED ORDER — FLUTICASONE FUROATE-VILANTEROL 100-25 MCG/ACT IN AEPB
1.0000 | INHALATION_SPRAY | Freq: Every day | RESPIRATORY_TRACT | Status: DC
  Administered 2022-05-04 – 2022-05-05 (×2): 1 via RESPIRATORY_TRACT
  Filled 2022-05-03: qty 28

## 2022-05-03 MED ORDER — NORTRIPTYLINE HCL 25 MG PO CAPS
25.0000 mg | ORAL_CAPSULE | Freq: Every day | ORAL | Status: DC
  Administered 2022-05-04 (×2): 25 mg via ORAL
  Filled 2022-05-03 (×4): qty 1

## 2022-05-03 MED ORDER — PRIMIDONE 50 MG PO TABS
50.0000 mg | ORAL_TABLET | Freq: Every day | ORAL | Status: DC
  Administered 2022-05-04 (×2): 50 mg via ORAL
  Filled 2022-05-03 (×4): qty 1

## 2022-05-03 MED ORDER — SODIUM CHLORIDE 0.9 % IV BOLUS
1000.0000 mL | Freq: Once | INTRAVENOUS | Status: AC
  Administered 2022-05-03: 1000 mL via INTRAVENOUS

## 2022-05-03 MED ORDER — ATORVASTATIN CALCIUM 40 MG PO TABS
40.0000 mg | ORAL_TABLET | Freq: Every day | ORAL | Status: DC
  Administered 2022-05-04 – 2022-05-05 (×2): 40 mg via ORAL
  Filled 2022-05-03 (×2): qty 1

## 2022-05-03 MED ORDER — PANTOPRAZOLE SODIUM 40 MG IV SOLR
40.0000 mg | Freq: Once | INTRAVENOUS | Status: AC
  Administered 2022-05-03: 40 mg via INTRAVENOUS
  Filled 2022-05-03: qty 10

## 2022-05-03 MED ORDER — IPRATROPIUM BROMIDE HFA 17 MCG/ACT IN AERS: 2.0000 | INHALATION_SPRAY | Freq: Four times a day (QID) | RESPIRATORY_TRACT | Status: DC | PRN

## 2022-05-03 MED ORDER — IPRATROPIUM-ALBUTEROL 0.5-2.5 (3) MG/3ML IN SOLN
3.0000 mL | Freq: Once | RESPIRATORY_TRACT | Status: AC
  Administered 2022-05-03: 3 mL via RESPIRATORY_TRACT
  Filled 2022-05-03: qty 3

## 2022-05-03 MED ORDER — METHYLPREDNISOLONE SODIUM SUCC 125 MG IJ SOLR
125.0000 mg | Freq: Once | INTRAMUSCULAR | Status: AC
Start: 2022-05-03 — End: 2022-05-03
  Administered 2022-05-03: 125 mg via INTRAVENOUS
  Filled 2022-05-03: qty 2

## 2022-05-03 MED ORDER — AMLODIPINE BESYLATE 5 MG PO TABS
5.0000 mg | ORAL_TABLET | Freq: Every day | ORAL | Status: DC
  Administered 2022-05-04 (×2): 5 mg via ORAL
  Filled 2022-05-03 (×2): qty 1

## 2022-05-03 MED ORDER — IPRATROPIUM BROMIDE 0.02 % IN SOLN
0.5000 mg | Freq: Four times a day (QID) | RESPIRATORY_TRACT | Status: DC | PRN
Start: 2022-05-03 — End: 2022-05-05

## 2022-05-03 MED ORDER — PREDNISONE 10 MG PO TABS
5.0000 mg | ORAL_TABLET | Freq: Every day | ORAL | Status: DC
  Administered 2022-05-04 – 2022-05-05 (×2): 5 mg via ORAL
  Filled 2022-05-03 (×2): qty 1

## 2022-05-03 MED ORDER — UMECLIDINIUM BROMIDE 62.5 MCG/ACT IN AEPB
1.0000 | INHALATION_SPRAY | Freq: Every day | RESPIRATORY_TRACT | Status: DC
  Administered 2022-05-04 – 2022-05-05 (×2): 1 via RESPIRATORY_TRACT
  Filled 2022-05-03: qty 7

## 2022-05-03 NOTE — ED Notes (Signed)
Patient transported to CT 

## 2022-05-03 NOTE — H&P (Signed)
History and Physical    Patient: [name] HAL:[id number] DOB: June 22, 1956 DOA: 05/03/2022 DOS: the patient was seen and examined on 05/03/2022 PCP: [name], MD  Patient coming from: Home  Chief Complaint:  Chief Complaint  Patient presents with   Shortness of Breath   HPI: [name] is a 66 y.o. 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  Patient presents with episode of vomiting earlier this morning.  Emesis described as black coffee-ground emesis.  Following her episode, she became tachypneic and short of breath.  She did not aspirate.  She was brought to the hospital for evaluation.  Here, the patient had a slight increase of her basal oxygen which improved quickly.  She was given Solu-Medrol DuoNeb.  She did have a CT angio which was negative.  Chest x-ray negative.  Patient feels that her breathing is at baseline.  Denies fevers, chills, increased cough, increased wheezing.  Review of Systems: As mentioned in the history of present illness. All other systems reviewed and are negative. Past Medical History:  Diagnosis Date   Anemia    Aortic atherosclerosis (HCC)    Arthritis    Asthma    Bilateral carotid artery stenosis    Cancer (HCC)    Gastric Cancer   COPD (chronic obstructive pulmonary disease) (HCC)    High cholesterol    Hyperlipidemia    Hypertension    Neuropathy    Osteoarthritis    Ovarian cancer ([place])    Oxygen dependent    Peripheral vascular disease ([place])    Peritoneal carcinoma ([place])    Pneumonia 2019   Port-A-Cath in place 07/03/2020   Pulmonary emphysema ([place])    Past Surgical History:  Procedure Laterality Date   ESOPHAGOGASTRODUODENOSCOPY (EGD) WITH PROPOFOL N/A 02/17/2021   Procedure: ESOPHAGOGASTRODUODENOSCOPY (EGD) WITH PROPOFOL;  Surgeon: Harvel Quale, MD;   [facility];  Service: Gastroenterology;  Laterality: N/A;   HALLUX VALGUS BASE WEDGE Right 06/09/2015   Procedure: Base wedge osteotomy with modified McBride right foot ;  Surgeon: Sharlotte Alamo, MD;  Location: [facility];  Service: Podiatry;  Laterality: Right;   PORTACATH PLACEMENT Left 06/28/2020   Procedure: PORT-A-CATHETER PLACEMENT LEFT CHEST (attached catheter in left subclavian);  Surgeon: Virl Cagey, MD;  Location: [facility];  Service: General;  Laterality: Left;   TUBAL LIGATION     VIDEO BRONCHOSCOPY WITH ENDOBRONCHIAL NAVIGATION N/A 03/09/2021   Procedure: VIDEO BRONCHOSCOPY WITH ENDOBRONCHIAL NAVIGATION;  Surgeon: Ottie Glazier, MD;  Location: [facility];  Service: Thoracic;  Laterality: N/A;   VIDEO BRONCHOSCOPY WITH ENDOBRONCHIAL ULTRASOUND N/A 03/09/2021   Procedure: VIDEO BRONCHOSCOPY WITH ENDOBRONCHIAL ULTRASOUND;  Surgeon: Ottie Glazier, MD;  Location: [facility];  Service: Thoracic;  Laterality: N/A;   Social History:  reports that she quit smoking about 8 years ago. Her smoking use included cigarettes. She has a 60.00 pack-year smoking history. She has never used smokeless tobacco. She reports that she does not drink alcohol and does not use drugs.  No Known Allergies  Family History  Problem Relation Age of Onset   Alzheimer's disease Mother    COPD Father    Emphysema Father    Hypertension Father    Healthy Sister    Healthy Brother    Alzheimer's disease Maternal Grandmother    Healthy Sister    Healthy Sister    Healthy Sister  Prostate cancer Other        paternal grandmother's brother; dx in early 63s   Breast cancer Neg Hx     Prior to Admission medications   Medication Sig Start Date End Date Taking? Authorizing Provider  Acetylcysteine (MUCOMYST-10 IN) Inhale 1 vial into the lungs every three (3) days as needed (copd).   Yes [provider]  albuterol (VENTOLIN HFA) 108 (90 Base) MCG/ACT inhaler Inhale 2 puffs into the lungs every  6 (six) hours as needed for wheezing or shortness of breath.   Yes [provider]  amLODipine (NORVASC) 5 MG tablet Take 5 mg by mouth at bedtime.    Yes [provider]  aspirin EC 81 MG tablet Take 81 mg by mouth daily.   Yes [provider]  atorvastatin (LIPITOR) 40 MG tablet Take 1 tablet (40 mg total) by mouth daily. 01/02/22  Yes Dessa Phi, DO  ferrous sulfate 325 (65 FE) MG tablet Take 325 mg by mouth every other day.   Yes [provider]  Fluticasone-Umeclidin-Vilant (TRELEGY ELLIPTA) 100-62.5-25 MCG/ACT AEPB Inhale into the lungs. 06/18/21  Yes [provider]  ipratropium (ATROVENT HFA) 17 MCG/ACT inhaler Inhale 2 puffs into the lungs every 6 (six) hours as needed for wheezing. 03/13/22  Yes Johnson, Clanford L, MD  levalbuterol (XOPENEX) 1.25 MG/3ML nebulizer solution Inhale into the lungs. 09/28/21  Yes [provider]  magnesium oxide (MAG-OX) 400 (240 Mg) MG tablet Take 1 tablet by mouth twice daily Patient taking differently: Take 400 mg by mouth 2 (two) times daily. 03/04/22  Yes Derek Jack, MD  naproxen sodium (ALEVE) 220 MG tablet Take 220 mg by mouth daily as needed (pain, fever).   Yes [provider]  niraparib tosylate (ZEJULA) 100 MG capsule Take 2 capsules (200 mg total) by mouth daily. May take at bedtime to reduce nausea and vomiting. 04/12/22  Yes Derek Jack, MD  nortriptyline (PAMELOR) 25 MG capsule Take 25 mg by mouth at bedtime.   Yes [provider]  Omeprazole-Sodium Bicarbonate (ZEGERID) 20-1100 MG CAPS capsule Take 1 capsule by mouth daily before breakfast.   Yes [provider]  pantoprazole (PROTONIX) 40 MG tablet Take 40 mg by mouth daily. 04/04/22  Yes [provider]  predniSONE (DELTASONE) 5 MG tablet Take 5 mg by mouth daily with breakfast.   Yes [provider]  primidone (MYSOLINE) 50 MG tablet Take 50 mg by mouth at bedtime. 01/30/22  Yes  [provider]  OXYGEN Inhale 3-4 L into the lungs continuous.    [provider]  predniSONE (DELTASONE) 10 MG tablet Take 1 tablet (10 mg total) by mouth daily with breakfast. Patient not taking: Reported on 05/03/2022 03/13/22   Murlean Iba, MD    Physical Exam: Vitals:   05/03/22 1730 05/03/22 1800 05/03/22 1830 05/03/22 1900  BP: (!) 109/52 90/75 126/65 114/66  Pulse: (!) 105 (!) 105 (!) 105 (!) 104  Resp: 20 (!) 21 (!) 26 (!) 25  Temp:      TempSrc:      SpO2: 98% 97% 97% 95%  Weight:      Height:       General: Elderly female. Awake and alert and oriented x3. No acute cardiopulmonary distress.  HEENT: Normocephalic atraumatic.  Right and left ears normal in appearance.  Pupils equal, round, reactive to light. Extraocular muscles are intact. Sclerae anicteric and noninjected.  Moist mucosal membranes. No mucosal lesions.  Neck: Neck supple without  lymphadenopathy. No carotid bruits. No masses palpated.  Cardiovascular: Regular rate with normal S1-S2 sounds. No murmurs, rubs, gallops auscultated. No JVD.  Respiratory: Mild wheeze bilaterally.  No rales in bases.  No accessory muscle use. Abdomen: Soft, mild diffuse tenderness without rebound or guarding, nondistended. Active bowel sounds. No masses or hepatosplenomegaly  Skin: No rashes, lesions, or ulcerations.  Dry, warm to touch. 2+ dorsalis pedis and radial pulses. Musculoskeletal: No calf or leg pain. All major joints not erythematous nontender.  No upper or lower joint deformation.  Good ROM.  No contractures  Psychiatric: Intact judgment and insight. Pleasant and cooperative. Neurologic: No focal neurological deficits. Strength is 5/5 and symmetric in upper and lower extremities.  Cranial nerves II through XII are grossly intact.  Data Reviewed: Results for orders placed or performed during the hospital encounter of 05/03/22 (from the past 24 hour(s))  CBC with Differential     Status: Abnormal    Collection Time: 05/03/22 12:51 PM  Result Value Ref Range   WBC 7.6 4.0 - 10.5 K/uL   RBC 2.92 (L) 3.87 - 5.11 MIL/uL   Hemoglobin 9.2 (L) 12.0 - 15.0 g/dL   HCT 29.0 (L) 36.0 - 46.0 %   MCV 99.3 80.0 - 100.0 fL   MCH 31.5 26.0 - 34.0 pg   MCHC 31.7 30.0 - 36.0 g/dL   RDW 15.9 (H) 11.5 - 15.5 %   Platelets 201 150 - 400 K/uL   nRBC 0.0 0.0 - 0.2 %   Neutrophils Relative % 73 %   Neutro Abs 5.6 1.7 - 7.7 K/uL   Lymphocytes Relative 10 %   Lymphs Abs 0.8 0.7 - 4.0 K/uL   Monocytes Relative 13 %   Monocytes Absolute 1.0 0.1 - 1.0 K/uL   Eosinophils Relative 2 %   Eosinophils Absolute 0.1 0.0 - 0.5 K/uL   Basophils Relative 1 %   Basophils Absolute 0.1 0.0 - 0.1 K/uL   Immature Granulocytes 1 %   Abs Immature Granulocytes 0.04 0.00 - 0.07 K/uL  Comprehensive metabolic panel     Status: Abnormal   Collection Time: 05/03/22 12:51 PM  Result Value Ref Range   Sodium 138 135 - 145 mmol/L   Potassium 3.7 3.5 - 5.1 mmol/L   Chloride 94 (L) 98 - 111 mmol/L   CO2 35 (H) 22 - 32 mmol/L   Glucose, Bld 115 (H) 70 - 99 mg/dL   BUN 17 8 - 23 mg/dL   Creatinine, Ser 0.71 0.44 - 1.00 mg/dL   Calcium 8.9 8.9 - 10.3 mg/dL   Total Protein 6.1 (L) 6.5 - 8.1 g/dL   Albumin 3.4 (L) 3.5 - 5.0 g/dL   AST 22 15 - 41 U/L   ALT 11 0 - 44 U/L   Alkaline Phosphatase 77 38 - 126 U/L   Total Bilirubin 0.2 (L) 0.3 - 1.2 mg/dL   GFR, Estimated >60 >60 mL/min   Anion gap 9 5 - 15  Lipase, blood     Status: None   Collection Time: 05/03/22 12:51 PM  Result Value Ref Range   Lipase 23 11 - 51 U/L  POC occult blood, ED Provider will collect     Status: Abnormal   Collection Time: 05/03/22  6:09 PM  Result Value Ref Range   Fecal Occult Bld POSITIVE (A) NEGATIVE   CT Angio Chest PE W and/or Wo Contrast  Result Date: 05/03/2022 CLINICAL DATA:  Chronic shortness of breath, worsened this morning with midsternal chest  pain EXAM: CT ANGIOGRAPHY CHEST WITH CONTRAST TECHNIQUE: Multidetector CT imaging of the  chest was performed using the standard protocol during bolus administration of intravenous contrast. Multiplanar CT image reconstructions and MIPs were obtained to evaluate the vascular anatomy. RADIATION DOSE REDUCTION: This exam was performed according to the departmental dose-optimization program which includes automated exposure control, adjustment of the mA and/or kV according to patient size and/or use of iterative reconstruction technique. CONTRAST:  31m OMNIPAQUE IOHEXOL 350 MG/ML SOLN COMPARISON:  Chest CT dated February 19, 2022 FINDINGS: Cardiovascular: No evidence of pulmonary embolus, evaluation of the segmental pulmonary arteries is somewhat limited due to contrast timing. Normal heart size. No pericardial effusion. Mild three-vessel coronary artery calcifications. Atherosclerotic disease of the thoracic aorta. Mediastinum/Nodes: Moderate to severe circumferential wall thickening of the lower esophagus, new when compared to prior exam. Thyroid is unremarkable. Lungs/Pleura: Central airways are patent. Bronchiectasis of the left lower lobe and lingula. Bronchiectasis of the lingula and left lower lobe with clustered centrilobular nodules which are increased in the area of the lingula, largest solid nodule measures 5 mm on series 7, image 63. Unchanged linear opacities of the bilateral upper lobes, likely due to scarring. No consolidation, pleural effusion or pneumothorax. Upper Abdomen: No acute abnormality. Musculoskeletal: No chest wall abnormality. No acute or significant osseous findings. Review of the MIP images confirms the above findings. IMPRESSION: 1. No evidence of pulmonary embolus. 2. Moderate to severe circumferential wall thickening of the distal esophagus which is new when compared with prior February 19, 2022 exam, likely due to esophagitis. Recommend endoscopy for further evaluation. 3. Bronchiectasis of the lingula and left lower lobe with clustered centrilobular nodules which are increased  in the area of the lingula, likely due to worsening chronic atypical infection, recurrent aspiration is an additional consideration. Recommend follow-up chest CT in 6 months. 4. Aortic Atherosclerosis (ICD10-I70.0) and Emphysema (ICD10-J43.9). Electronically Signed   By: LYetta GlassmanM.D.   On: 05/03/2022 15:18   DG Chest Port 1 View  Result Date: 05/03/2022 CLINICAL DATA:  Shortness of breath and vomiting. History of COPD and ovarian cancer. EXAM: PORTABLE CHEST 1 VIEW COMPARISON:  Chest radiograph 03/10/2022 FINDINGS: A left subclavian Port-A-Cath remains in place, terminating over the lower SVC. The cardiomediastinal silhouette is unchanged with normal heart size. Aortic atherosclerosis is noted. Scarring in the lung apices and left lung base is unchanged, as is chronic interstitial coarsening diffusely. No acute airspace consolidation, overt pulmonary edema, sizable pleural effusion, or pneumothorax is identified. No acute osseous abnormality is seen. IMPRESSION: No active disease. Electronically Signed   By: ALogan BoresM.D.   On: 05/03/2022 12:57     Assessment and Plan: No notes have been filed under this hospital service. Service: Hospitalist  Principal Problem:   Hematochezia Active Problems:   COPD (chronic obstructive pulmonary disease) (HCC)   Hypertension   Chronic respiratory failure with hypoxia (HCC)   Ovarian cancer (HCC)   Upper GI bleed   Deficiency anemia  Hematochezia with esophageal thickening Patient discussed with Dr. RGala RomneyAdmit CBC tonight and tomorrow morning Type and screen Protonix twice daily IV Clear liquids now and n.p.o. after midnight Plan for scope tomorrow Upper GI bleeding Anemia secondary to GI bleed and iron deficiency We will currently hold on transfusion.  May need if patient's blood counts continue to drop Ovarian cancer Continue Zejula and primidone Chronic respiratory failure with hypoxia secondary to COPD Continue  inhalers Hypertension Continue antihypertensives   Advance Care Planning:  Code Status: Prior full code  Consults: GI  Family Communication: Daughter present during interview and exam  Severity of Illness: The appropriate patient status for this patient is INPATIENT. Inpatient status is judged to be reasonable and necessary in order to provide the required intensity of service to ensure the patient's safety. The patient's presenting symptoms, physical exam findings, and initial radiographic and laboratory data in the context of their chronic comorbidities is felt to place them at high risk for further clinical deterioration. Furthermore, it is not anticipated that the patient will be medically stable for discharge from the hospital within 2 midnights of admission.   * I certify that at the point of admission it is my clinical judgment that the patient will require inpatient hospital care spanning beyond 2 midnights from the point of admission due to high intensity of service, high risk for further deterioration and high frequency of surveillance required.*  Author: Truett Mainland, DO 05/03/2022 7:54 PM  For on call review [email or web address].

## 2022-05-03 NOTE — ED Provider Notes (Addendum)
Patient with heme positive black stools and vomiting black material.  Patient is mildly anemic.  CT scan of the chest does not show PE but does show probable esophagitis.  I spoke with Dr. Gala Romney gastroenterology and he states they will see the patient tomorrow in the hospital.  I will get the hospitalist to admit the patient for upper GI bleed and possible atypical lung infection   Milton Ferguson, MD 05/03/22 Earley Favor    Milton Ferguson, MD 05/03/22 1845

## 2022-05-03 NOTE — ED Provider Notes (Signed)
[facility] EMERGENCY DEPARTMENT Provider Note   CSN: [id number] Arrival date & time: 05/03/22  1149     History  Chief Complaint  Patient presents with   Shortness of Breath    [name] is a 66 y.o. female.  Patient with history of ovarian cancer, COPD followed by oncology presents with episode of dark vomitus earlier today.  Associate with shortness of breath sensation.  Otherwise denies any pain at this time denies fevers or cough denies diarrhea.  Continues on her baseline 3 L nasal cannula oxygen support.       Home Medications Prior to Admission medications   Medication Sig Start Date End Date Taking? Authorizing Provider  Acetylcysteine (MUCOMYST-10 IN) Inhale 1 vial into the lungs every three (3) days as needed (copd).   Yes [provider]  albuterol (VENTOLIN HFA) 108 (90 Base) MCG/ACT inhaler Inhale 2 puffs into the lungs every 6 (six) hours as needed for wheezing or shortness of breath.   Yes [provider]  amLODipine (NORVASC) 5 MG tablet Take 5 mg by mouth at bedtime.    Yes [provider]  aspirin EC 81 MG tablet Take 81 mg by mouth daily.   Yes [provider]  atorvastatin (LIPITOR) 40 MG tablet Take 1 tablet (40 mg total) by mouth daily. 01/02/22  Yes Dessa Phi, DO  ferrous sulfate 325 (65 FE) MG tablet Take 325 mg by mouth every other day.   Yes [provider]  Fluticasone-Umeclidin-Vilant (TRELEGY ELLIPTA) 100-62.5-25 MCG/ACT AEPB Inhale into the lungs. 06/18/21  Yes [provider]  ipratropium (ATROVENT HFA) 17 MCG/ACT inhaler Inhale 2 puffs into the lungs every 6 (six) hours as needed for wheezing. 03/13/22  Yes Johnson, Clanford L, MD  levalbuterol (XOPENEX) 1.25 MG/3ML nebulizer solution Inhale into the lungs. 09/28/21  Yes [provider]  magnesium oxide (MAG-OX) 400 (240 Mg) MG tablet Take 1 tablet by mouth twice daily Patient taking differently: Take 400 mg by mouth 2 (two) times  daily. 03/04/22  Yes Derek Jack, MD  naproxen sodium (ALEVE) 220 MG tablet Take 220 mg by mouth daily as needed (pain, fever).   Yes [provider]  niraparib tosylate (ZEJULA) 100 MG capsule Take 2 capsules (200 mg total) by mouth daily. May take at bedtime to reduce nausea and vomiting. 04/12/22  Yes Derek Jack, MD  nortriptyline (PAMELOR) 25 MG capsule Take 25 mg by mouth at bedtime.   Yes [provider]  Omeprazole-Sodium Bicarbonate (ZEGERID) 20-1100 MG CAPS capsule Take 1 capsule by mouth daily before breakfast.   Yes [provider]  pantoprazole (PROTONIX) 40 MG tablet Take 40 mg by mouth daily. 04/04/22  Yes [provider]  predniSONE (DELTASONE) 5 MG tablet Take 5 mg by mouth daily with breakfast.   Yes [provider]  primidone (MYSOLINE) 50 MG tablet Take 50 mg by mouth at bedtime. 01/30/22  Yes [provider]  OXYGEN Inhale 3-4 L into the lungs continuous.    [provider]  predniSONE (DELTASONE) 10 MG tablet Take 1 tablet (10 mg total) by mouth daily with breakfast. Patient not taking: Reported on 05/03/2022 03/13/22   Murlean Iba, MD      Allergies    Patient has no known allergies.    Review of Systems   Review of Systems  Constitutional:  Negative for fever.  HENT:  Negative for ear pain.   Eyes:  Negative for pain.  Respiratory:  Positive for  shortness of breath. Negative for cough.   Cardiovascular:  Negative for chest pain.  Gastrointestinal:  Negative for abdominal pain.  Genitourinary:  Negative for flank pain.  Musculoskeletal:  Negative for back pain.  Skin:  Negative for rash.  Neurological:  Negative for headaches.    Physical Exam Updated Vital Signs BP 115/73   Pulse (!) 113   Temp 97.7 F (36.5 C) (Oral)   Resp (!) 22   Ht 5' (1.524 m)   Wt 56.7 kg   SpO2 98%   BMI 24.41 kg/m  Physical Exam Constitutional:      General: She is not in acute distress.     Appearance: Normal appearance.  HENT:     Head: Normocephalic.     Nose: Nose normal.  Eyes:     Extraocular Movements: Extraocular movements intact.  Cardiovascular:     Rate and Rhythm: Tachycardia present.  Pulmonary:     Effort: Pulmonary effort is normal.  Musculoskeletal:        General: Normal range of motion.     Cervical back: Normal range of motion.  Neurological:     General: No focal deficit present.     Mental Status: She is alert. Mental status is at baseline.     ED Results / Procedures / Treatments   Labs (all labs ordered are listed, but only abnormal results are displayed) Labs Reviewed  CBC WITH DIFFERENTIAL/PLATELET - Abnormal; Notable for the following components:      Result Value   RBC 2.92 (*)    Hemoglobin 9.2 (*)    HCT 29.0 (*)    RDW 15.9 (*)    All other components within normal limits  COMPREHENSIVE METABOLIC PANEL - Abnormal; Notable for the following components:   Chloride 94 (*)    CO2 35 (*)    Glucose, Bld 115 (*)    Total Protein 6.1 (*)    Albumin 3.4 (*)    Total Bilirubin 0.2 (*)    All other components within normal limits  LIPASE, BLOOD    EKG None  Radiology DG Chest Port 1 View  Result Date: 05/03/2022 CLINICAL DATA:  Shortness of breath and vomiting. History of COPD and ovarian cancer. EXAM: PORTABLE CHEST 1 VIEW COMPARISON:  Chest radiograph 03/10/2022 FINDINGS: A left subclavian Port-A-Cath remains in place, terminating over the lower SVC. The cardiomediastinal silhouette is unchanged with normal heart size. Aortic atherosclerosis is noted. Scarring in the lung apices and left lung base is unchanged, as is chronic interstitial coarsening diffusely. No acute airspace consolidation, overt pulmonary edema, sizable pleural effusion, or pneumothorax is identified. No acute osseous abnormality is seen. IMPRESSION: No active disease. Electronically Signed   By: Logan Bores M.D.   On: 05/03/2022 12:57    Procedures Procedures     Medications Ordered in ED Medications  sodium chloride 0.9 % bolus 1,000 mL (has no administration in time range)  pantoprazole (PROTONIX) injection 40 mg (40 mg Intravenous Given 05/03/22 1308)  ipratropium-albuterol (DUONEB) 0.5-2.5 (3) MG/3ML nebulizer solution 3 mL (3 mLs Nebulization Given 05/03/22 1337)  methylPREDNISolone sodium succinate (SOLU-MEDROL) 125 mg/2 mL injection 125 mg (125 mg Intravenous Given 05/03/22 1308)  iohexol (OMNIPAQUE) 350 MG/ML injection 75 mL (75 mLs Intravenous Contrast Given 05/03/22 1416)    ED Course/ Medical Decision Making/ A&P                           Medical Decision Making Amount  and/or Complexity of Data Reviewed Labs: ordered. Radiology: ordered.  Risk Prescription drug management.   Chart review shows admission in April for sepsis.  History obtained from family bedside.  Cardiac monitor showing sinus rhythm tachycardic rate.  Work-up sent white count is normal 7 hemoglobin 9 chemistry unremarkable.  Hemoglobin appears to fluctuate from 8.8-10.  CT angio of the chest ordered given complaint of shortness of breath and tachycardia.  Pending final read.          Final Clinical Impression(s) / ED Diagnoses Final diagnoses:  Hemoptysis  Hematemesis, unspecified whether nausea present    Rx / DC Orders ED Discharge Orders     None         Luna Fuse, MD 05/03/22 1625

## 2022-05-03 NOTE — ED Triage Notes (Signed)
Patient arrives with [facility] from home c/o [facility] and vomiting blood. Pt has hx of COPD and stage 4 ovarian cancer; pt normally wears 3L O2 at home. Pt's O2 sat was 87% on RA on EMS arrival, was placed on NRB at 15L then decreased to 8L; O2 sat 94%. Pt reports being tx'd with antibiotics for E. Coli infection.  EMS vitals: BP 96/70 HR 130's Temp 98.7 RR 40

## 2022-05-04 ENCOUNTER — Encounter (HOSPITAL_COMMUNITY): Payer: Self-pay | Admitting: Family Medicine

## 2022-05-04 ENCOUNTER — Encounter (HOSPITAL_COMMUNITY)
Admission: EM | Disposition: A | Discharge status: Home / Self Care | Payer: Self-pay | Source: Home / Self Care | Attending: Internal Medicine

## 2022-05-04 ENCOUNTER — Inpatient Hospital Stay (HOSPITAL_COMMUNITY): Payer: Medicare HMO | Admitting: Anesthesiology

## 2022-05-04 DIAGNOSIS — K920 Hematemesis: Secondary | ICD-10-CM

## 2022-05-04 DIAGNOSIS — R195 Other fecal abnormalities: Secondary | ICD-10-CM

## 2022-05-04 DIAGNOSIS — K921 Melena: Secondary | ICD-10-CM | POA: Diagnosis not present

## 2022-05-04 HISTORY — PX: ESOPHAGOGASTRODUODENOSCOPY (EGD) WITH PROPOFOL: SHX5813

## 2022-05-04 LAB — CBC
HCT: 28.7 % — ABNORMAL LOW (ref 36.0–46.0)
Hemoglobin: 8.9 g/dL — ABNORMAL LOW (ref 12.0–15.0)
MCH: 30.4 pg (ref 26.0–34.0)
MCHC: 31 g/dL (ref 30.0–36.0)
MCV: 98 fL (ref 80.0–100.0)
Platelets: 194 10*3/uL (ref 150–400)
RBC: 2.93 MIL/uL — ABNORMAL LOW (ref 3.87–5.11)
RDW: 15.9 % — ABNORMAL HIGH (ref 11.5–15.5)
WBC: 6.6 10*3/uL (ref 4.0–10.5)
nRBC: 0 % (ref 0.0–0.2)

## 2022-05-04 LAB — MRSA NEXT GEN BY PCR, NASAL: MRSA by PCR Next Gen: NOT DETECTED

## 2022-05-04 SURGERY — ESOPHAGOGASTRODUODENOSCOPY (EGD) WITH PROPOFOL
Anesthesia: General

## 2022-05-04 MED ORDER — PROPOFOL 10 MG/ML IV BOLUS
INTRAVENOUS | Status: AC
  Filled 2022-05-04: qty 20

## 2022-05-04 MED ORDER — CHLORHEXIDINE GLUCONATE CLOTH 2 % EX PADS
6.0000 | MEDICATED_PAD | Freq: Every day | CUTANEOUS | Status: DC
  Administered 2022-05-04 – 2022-05-05 (×2): 6 via TOPICAL

## 2022-05-04 MED ORDER — SODIUM CHLORIDE 0.9 % IV SOLN: INTRAVENOUS | Status: DC

## 2022-05-04 MED ORDER — SUCRALFATE 1 GM/10ML PO SUSP
1.0000 g | Freq: Three times a day (TID) | ORAL | Status: DC
  Administered 2022-05-04 – 2022-05-05 (×3): 1 g via ORAL
  Filled 2022-05-04 (×3): qty 10

## 2022-05-04 MED ORDER — PROPOFOL 10 MG/ML IV BOLUS
INTRAVENOUS | Status: DC | PRN
  Administered 2022-05-04: 200 mg via INTRAVENOUS

## 2022-05-04 MED ORDER — LACTATED RINGERS IV SOLN: INTRAVENOUS | Status: DC | PRN

## 2022-05-04 NOTE — Transfer of Care (Signed)
Immediate Anesthesia Transfer of Care Note  Patient: ROBBYN HODKINSON  Procedure(s) Performed: ESOPHAGOGASTRODUODENOSCOPY (EGD) WITH PROPOFOL  Patient Location: PACU  Anesthesia Type:General  Level of Consciousness: awake and alert   Airway & Oxygen Therapy: Patient Spontanous Breathing and Patient connected to nasal cannula oxygen  Post-op Assessment: Report given to RN and Post -op Vital signs reviewed and stable  Post vital signs: Reviewed and stable  Last Vitals:  Vitals Value Taken Time  BP    Temp    Pulse 95 05/04/22 1222  Resp 18 05/04/22 1222  SpO2 93 % 05/04/22 1222  Vitals shown include unvalidated device data.  Last Pain:  Vitals:   05/04/22 1159  TempSrc:   PainSc: 0-No pain         Complications: No notable events documented.

## 2022-05-04 NOTE — Anesthesia Postprocedure Evaluation (Signed)
Anesthesia Post Note  Patient: [name]  Procedure(s) Performed: ESOPHAGOGASTRODUODENOSCOPY (EGD) WITH PROPOFOL  Patient location during evaluation: PACU Anesthesia Type: General Level of consciousness: awake and alert Pain management: pain level controlled Vital Signs Assessment: post-procedure vital signs reviewed and stable Respiratory status: spontaneous breathing, nonlabored ventilation, respiratory function stable and patient connected to nasal cannula oxygen Cardiovascular status: blood pressure returned to baseline and stable Postop Assessment: no apparent nausea or vomiting Anesthetic complications: no   No notable events documented.   Last Vitals:  Vitals:   05/04/22 1108 05/04/22 1129  BP:  136/77  Pulse: 100 (!) 104  Resp: (!) 23 19  Temp: 36.8 C 37 C  SpO2: 99% 97%    Last Pain:  Vitals:   05/04/22 1159  TempSrc:   PainSc: 0-No pain                 Louann Sjogren

## 2022-05-04 NOTE — Progress Notes (Signed)
PROGRESS NOTE    [name]  IRW:[id number] DOB: [birth year] DOA: 05/03/2022 PCP: [name], MD   Brief Narrative:    [name] is a 66 y.o. 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  She was admitted with hematemesis and has undergone EGD on 6/17 demonstrating findings of erosive reflux esophagitis.   Assessment & Plan:   Principal Problem:   Hematochezia Active Problems:   COPD (chronic obstructive pulmonary disease) (HCC)   Hypertension   Chronic respiratory failure with hypoxia (HCC)   Ovarian cancer (HCC)   Upper GI bleed   Deficiency anemia  Assessment and Plan:   Acute upper GI bleeding with hematochezia and esophageal thickening Secondary to erosive reflux esophagitis on EGD 6/17 Continue PPI as well as Carafate as ordered Monitor and advance diet Recheck CBC in a.m.  Anemia secondary to GI bleed and iron deficiency We will currently hold on transfusion.  May need if patient's blood counts continue to drop Follow a.m. CBC Ovarian cancer Continue Zejula and primidone Chronic respiratory failure with hypoxia secondary to COPD Continue inhalers as needed Wears 3 L nasal cannula at home Hypertension Continue antihypertensives   DVT prophylaxis:SCDs Code Status: Full Family Communication:None at bedside  Disposition Plan:  Status is: Inpatient Remains inpatient appropriate because: IV medications.   Consultants:  GI  Procedures:  EGD 6/17  Antimicrobials:  None   Subjective: Patient seen and evaluated today with no new acute complaints or concerns. No acute concerns or events noted overnight.  Objective: Vitals:   05/04/22 1108 05/04/22 1129 05/04/22 1230 05/04/22 1245  BP:  136/77 (!) 91/57 (!) 113/55  Pulse: 100 (!) 104 98 96  Resp: (!) 23 19 (!) 23 19  Temp: 98.2 F  (36.8 C) 98.6 F (37 C) 98.4 F (36.9 C)   TempSrc: Oral Oral    SpO2: 99% 97% 99% 100%  Weight:      Height:        Intake/Output Summary (Last 24 hours) at 05/04/2022 1351 Last data filed at 05/04/2022 1218 Gross per 24 hour  Intake 1250 ml  Output --  Net 1250 ml   Filed Weights   05/03/22 1209 05/03/22 2115  Weight: 56.7 kg 56.8 kg    Examination:  General exam: Appears calm and comfortable  Respiratory system: Clear to auscultation. Respiratory effort normal.  3 L nasal cannula Cardiovascular system: S1 & S2 heard, RRR.  Gastrointestinal system: Abdomen is soft Central nervous system: Alert and awake Extremities: No edema Skin: No significant lesions noted Psychiatry: Flat affect.    Data Reviewed: I have personally reviewed following labs and imaging studies  CBC: Recent Labs  Lab 05/03/22 1251 05/03/22 2033 05/04/22 0325  WBC 7.6 8.1 6.6  NEUTROABS 5.6  --   --   HGB 9.2* 8.9* 8.9*  HCT 29.0* 28.0* 28.7*  MCV 99.3 97.2 98.0  PLT 201 187 761   Basic Metabolic Panel: Recent Labs  Lab 05/03/22 1251  NA 138  K 3.7  CL 94*  CO2 35*  GLUCOSE 115*  BUN 17  CREATININE 0.71  CALCIUM 8.9   GFR: Estimated Creatinine Clearance: 56.8 mL/min (by C-G formula based on SCr of 0.71 mg/dL). Liver Function Tests: Recent Labs  Lab 05/03/22 1251  AST 22  ALT 11  ALKPHOS 77  BILITOT 0.2*  PROT 6.1*  ALBUMIN 3.4*   Recent Labs  Lab 05/03/22 1251  LIPASE 23   No results for input(s): "AMMONIA" in the last 168 hours. Coagulation Profile: No results for input(s): "INR", "PROTIME" in the last 168 hours. Cardiac Enzymes: No results for input(s): "CKTOTAL", "CKMB", "CKMBINDEX", "TROPONINI" in the last 168 hours. BNP (last 3 results) No results for input(s): "PROBNP" in the last 8760 hours. HbA1C: No results for input(s): "HGBA1C" in the last 72 hours. CBG: No results for input(s): "GLUCAP" in the last 168 hours. Lipid Profile: No results for  input(s): "CHOL", "HDL", "LDLCALC", "TRIG", "CHOLHDL", "LDLDIRECT" in the last 72 hours. Thyroid Function Tests: No results for input(s): "TSH", "T4TOTAL", "FREET4", "T3FREE", "THYROIDAB" in the last 72 hours. Anemia Panel: No results for input(s): "VITAMINB12", "FOLATE", "FERRITIN", "TIBC", "IRON", "RETICCTPCT" in the last 72 hours. Sepsis Labs: No results for input(s): "PROCALCITON", "LATICACIDVEN" in the last 168 hours.  Recent Results (from the past 240 hour(s))  MRSA Next Gen by PCR, Nasal     Status: None   Collection Time: 05/03/22  8:57 PM   Specimen: Nasal Mucosa; Nasal Swab  Result Value Ref Range Status   MRSA by PCR Next Gen NOT DETECTED NOT DETECTED Final    Comment: (NOTE) The GeneXpert MRSA Assay (FDA approved for NASAL specimens only), is one component of a comprehensive MRSA colonization surveillance program. It is not intended to diagnose MRSA infection nor to guide or monitor treatment for MRSA infections. Test performance is not FDA approved in patients less than 32 years old. Performed at [facility], [address] [place], [place] [address]          Radiology Studies: CT Angio Chest PE W and/or Wo Contrast  Result Date: 05/03/2022 CLINICAL DATA:  Chronic shortness of breath, worsened this morning with midsternal chest pain EXAM: CT ANGIOGRAPHY CHEST WITH CONTRAST TECHNIQUE: Multidetector CT imaging of the chest was performed using the standard protocol during bolus administration of intravenous contrast. Multiplanar CT image reconstructions and MIPs were obtained to evaluate the vascular anatomy. RADIATION DOSE REDUCTION: This exam was performed according to the departmental dose-optimization program which includes automated exposure control, adjustment of the mA and/or kV according to patient size and/or use of iterative reconstruction technique. CONTRAST:  71m OMNIPAQUE IOHEXOL 350 MG/ML SOLN COMPARISON:  Chest CT dated February 19, 2022 FINDINGS: Cardiovascular:  No evidence of pulmonary embolus, evaluation of the segmental pulmonary arteries is somewhat limited due to contrast timing. Normal heart size. No pericardial effusion. Mild three-vessel coronary artery calcifications. Atherosclerotic disease of the thoracic aorta. Mediastinum/Nodes: Moderate to severe circumferential wall thickening of the lower esophagus, new when compared to prior exam. Thyroid is unremarkable. Lungs/Pleura: Central airways are patent. Bronchiectasis of the left lower lobe and lingula. Bronchiectasis of the lingula and left lower lobe with clustered centrilobular nodules which are increased in the area of the lingula, largest solid nodule measures 5 mm on series 7, image 63. Unchanged linear opacities of the bilateral upper lobes, likely due to scarring. No consolidation, pleural effusion or pneumothorax. Upper Abdomen: No acute abnormality. Musculoskeletal: No chest wall abnormality. No acute or significant osseous findings. Review of the MIP images confirms the above findings. IMPRESSION: 1. No evidence of pulmonary embolus. 2. Moderate to severe circumferential wall thickening of the distal esophagus which is new when compared with prior February 19, 2022 exam, likely due to esophagitis. Recommend endoscopy for further evaluation. 3. Bronchiectasis of the lingula and left lower lobe with clustered centrilobular nodules which are increased in the area of the lingula, likely due  to worsening chronic atypical infection, recurrent aspiration is an additional consideration. Recommend follow-up chest CT in 6 months. 4. Aortic Atherosclerosis (ICD10-I70.0) and Emphysema (ICD10-J43.9). Electronically Signed   By: Yetta Glassman M.D.   On: 05/03/2022 15:18   DG Chest Port 1 View  Result Date: 05/03/2022 CLINICAL DATA:  Shortness of breath and vomiting. History of COPD and ovarian cancer. EXAM: PORTABLE CHEST 1 VIEW COMPARISON:  Chest radiograph 03/10/2022 FINDINGS: A left subclavian Port-A-Cath  remains in place, terminating over the lower SVC. The cardiomediastinal silhouette is unchanged with normal heart size. Aortic atherosclerosis is noted. Scarring in the lung apices and left lung base is unchanged, as is chronic interstitial coarsening diffusely. No acute airspace consolidation, overt pulmonary edema, sizable pleural effusion, or pneumothorax is identified. No acute osseous abnormality is seen. IMPRESSION: No active disease. Electronically Signed   By: Logan Bores M.D.   On: 05/03/2022 12:57        Scheduled Meds:  amLODipine  5 mg Oral QHS   atorvastatin  40 mg Oral Daily   Chlorhexidine Gluconate Cloth  6 each Topical Daily   fluticasone furoate-vilanterol  1 puff Inhalation Daily   And   umeclidinium bromide  1 puff Inhalation Daily   niraparib tosylate  200 mg Oral Daily   nortriptyline  25 mg Oral QHS   pantoprazole (PROTONIX) IV  40 mg Intravenous Q12H   predniSONE  5 mg Oral Q breakfast   primidone  50 mg Oral QHS   sucralfate  1 g Oral TID WC & HS     LOS: 1 day    Time spent: 35 minutes    Vicki Wisby Darleen Crocker, DO [facility]  If 7PM-7AM, please contact night-coverage [email or web address] 05/04/2022, 1:51 PM

## 2022-05-04 NOTE — Consult Note (Signed)
Referring Provider: No ref. provider found Primary Care Physician:  [name], MD Primary Gastroenterologist:  Dr. 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  States her stools have been darker than usual in the past 24 hours as well (takes iron chronically); in the ED, she was found to have tachycardia and a soft blood pressure.  Dark Hemoccult positive stool on digital rectal examination in the ED; initial hemoglobin 9.2; this morning 8.9.  Hematocrit 28.7 platelet count 194,000. She is remained stable in the ICU without blood transfusion. Patient tells me she has not had any associated abdominal pain.  Specifically denies odynophagia, dysphagia or early satiety.  No hematochezia.  Takes Zegerid 20 mg daily. Other medications include Naprosyn and prednisone. Past GI history significant for undergoing an EGD for thickening of her esophagus seen on CT 1 year ago by Dr. Jenetta Downer.  Gastric erythema found.  Also, plaques in the esophagus.  Biopsies returned positive for Candida esophagitis.  She was treated with a 3-week course of Diflucan.  CTA earlier this year demonstrated no esophageal abnormalities; her current CT shows marked thickening of her distal esophagus relative to her CT done earlier this year.  Incidentally, patient states she has never had a colonoscopy.  Past Medical History:  Diagnosis Date   Anemia    Aortic atherosclerosis (HCC)    Arthritis    Asthma    Bilateral carotid artery stenosis    Cancer (HCC)    Gastric Cancer   COPD (chronic obstructive pulmonary disease)  (HCC)    High cholesterol    Hyperlipidemia    Hypertension    Neuropathy    Osteoarthritis    Ovarian cancer ([place])    Oxygen dependent    Peripheral vascular disease ([place])    Peritoneal carcinoma ([place])    Pneumonia 2019   Port-A-Cath in place 07/03/2020   Pulmonary emphysema ([place])     Past Surgical History:  Procedure Laterality Date   ESOPHAGOGASTRODUODENOSCOPY (EGD) WITH PROPOFOL N/A 02/17/2021   Procedure: ESOPHAGOGASTRODUODENOSCOPY (EGD) WITH PROPOFOL;  Surgeon: Harvel Quale, MD;  Location: [facility];  Service: Gastroenterology;  Laterality: N/A;   HALLUX VALGUS BASE WEDGE Right 06/09/2015   Procedure: Base wedge osteotomy with modified McBride right foot ;  Surgeon: Sharlotte Alamo, MD;  Location: [facility];  Service: Podiatry;  Laterality: Right;   PORTACATH PLACEMENT Left 06/28/2020   Procedure: PORT-A-CATHETER PLACEMENT LEFT CHEST (attached catheter in left subclavian);  Surgeon: Virl Cagey, MD;  Location: [facility];  Service: General;  Laterality: Left;   TUBAL LIGATION     VIDEO BRONCHOSCOPY WITH ENDOBRONCHIAL NAVIGATION N/A 03/09/2021   Procedure: VIDEO BRONCHOSCOPY WITH ENDOBRONCHIAL NAVIGATION;  Surgeon: Ottie Glazier, MD;  Location: [facility];  Service: Thoracic;  Laterality: N/A;   VIDEO BRONCHOSCOPY WITH ENDOBRONCHIAL ULTRASOUND N/A 03/09/2021   Procedure: VIDEO BRONCHOSCOPY WITH ENDOBRONCHIAL ULTRASOUND;  Surgeon: Ottie Glazier, MD;  Location: [facility];  Service: Thoracic;  Laterality: N/A;    Prior to Admission medications   Medication Sig Start Date End Date Taking? Authorizing Provider  Acetylcysteine (MUCOMYST-10 IN) Inhale 1 vial into the lungs every three (3) days as needed (copd).   Yes [provider]  albuterol (VENTOLIN HFA) 108 (90 Base) MCG/ACT inhaler Inhale 2 puffs into the lungs every 6 (six) hours as needed for wheezing or shortness of breath.   Yes [provider]  amLODipine (NORVASC) 5 MG tablet Take 5 mg by mouth at  bedtime.    Yes [provider]  aspirin EC 81 MG tablet Take 81 mg by mouth daily.   Yes [provider]  atorvastatin (LIPITOR) 40 MG tablet Take 1 tablet (40 mg total) by mouth daily. 01/02/22  Yes Dessa Phi, DO  ferrous sulfate 325 (65 FE) MG tablet Take 325 mg by mouth every other day.   Yes [provider]  Fluticasone-Umeclidin-Vilant (TRELEGY ELLIPTA) 100-62.5-25 MCG/ACT AEPB Inhale into the lungs. 06/18/21  Yes [provider]  ipratropium (ATROVENT HFA) 17 MCG/ACT inhaler Inhale 2 puffs into the lungs every 6 (six) hours as needed for wheezing. 03/13/22  Yes Johnson, Clanford L, MD  levalbuterol (XOPENEX) 1.25 MG/3ML nebulizer solution Inhale into the lungs. 09/28/21  Yes [provider]  magnesium oxide (MAG-OX) 400 (240 Mg) MG tablet Take 1 tablet by mouth twice daily Patient taking differently: Take 400 mg by mouth 2 (two) times daily. 03/04/22  Yes Derek Jack, MD  naproxen sodium (ALEVE) 220 MG tablet Take 220 mg by mouth daily as needed (pain, fever).   Yes [provider]  niraparib tosylate (ZEJULA) 100 MG capsule Take 2 capsules (200 mg total) by mouth daily. May take at bedtime to reduce nausea and vomiting. 04/12/22  Yes Derek Jack, MD  nortriptyline (PAMELOR) 25 MG capsule Take 25 mg by mouth at bedtime.   Yes [provider]  Omeprazole-Sodium Bicarbonate (ZEGERID) 20-1100 MG CAPS capsule Take 1 capsule by mouth daily before breakfast.   Yes [provider]  pantoprazole (PROTONIX) 40 MG tablet Take 40 mg by mouth daily. 04/04/22  Yes [provider]  predniSONE (DELTASONE) 5 MG tablet Take 5 mg by mouth daily with breakfast.   Yes [provider]  primidone (MYSOLINE) 50 MG tablet Take 50 mg by mouth at bedtime. 01/30/22  Yes [provider]  OXYGEN Inhale 3-4 L into the lungs continuous.    [provider]  predniSONE (DELTASONE) 10 MG tablet Take 1  tablet (10 mg total) by mouth daily with breakfast. Patient not taking: Reported on 05/03/2022 03/13/22   Murlean Iba, MD    Current Facility-Administered Medications  Medication Dose Route Frequency Provider Last Rate Last Admin   albuterol (PROVENTIL) (2.5 MG/3ML) 0.083% nebulizer solution 2.5 mg  2.5 mg Nebulization Q6H PRN Truett Mainland, DO       amLODipine (NORVASC) tablet 5 mg  5 mg Oral QHS Stinson, Jacob J, DO   5 mg at 05/04/22 0002   atorvastatin (LIPITOR) tablet 40 mg  40 mg Oral Daily Truett Mainland, DO   40 mg at 05/04/22 9983   Chlorhexidine Gluconate Cloth 2 % PADS 6 each  6 each Topical Daily Truett Mainland, DO   6 each at 05/04/22 0807   fluticasone furoate-vilanterol (BREO ELLIPTA) 100-25 MCG/ACT 1 puff  1 puff Inhalation Daily Truett Mainland, DO   1 puff at 05/04/22 0803   And   umeclidinium bromide (INCRUSE ELLIPTA) 62.5 MCG/ACT 1 puff  1 puff Inhalation Daily Truett Mainland, DO   1 puff at 05/04/22 0803   ipratropium (ATROVENT) nebulizer solution 0.5 mg  0.5 mg Nebulization Q6H PRN Truett Mainland, DO  niraparib tosylate (ZEJULA) capsule 200 mg  200 mg Oral Daily Truett Mainland, DO       nortriptyline (PAMELOR) capsule 25 mg  25 mg Oral QHS Truett Mainland, DO   25 mg at 05/04/22 0003   pantoprazole (PROTONIX) injection 40 mg  40 mg Intravenous Q12H Truett Mainland, DO   40 mg at 05/04/22 8527   predniSONE (DELTASONE) tablet 5 mg  5 mg Oral Q breakfast Truett Mainland, DO   5 mg at 05/04/22 7824   primidone (MYSOLINE) tablet 50 mg  50 mg Oral QHS Truett Mainland, DO   50 mg at 05/04/22 0003    Allergies as of 05/03/2022   (No Known Allergies)    Family History  Problem Relation Age of Onset   Alzheimer's disease Mother    COPD Father    Emphysema Father    Hypertension Father    Healthy Sister    Healthy Brother    Alzheimer's disease Maternal Grandmother    Healthy Sister    Healthy Sister    Healthy Sister    Prostate cancer  Other        paternal grandmother's brother; dx in early 65s   Breast cancer Neg Hx     Social History   Socioeconomic History   Marital status: Divorced    Spouse name: Not on file   Number of children: 3   Years of education: Not on file   Highest education level: Not on file  Occupational History   Occupation: DISABLED  Tobacco Use   Smoking status: Former    Packs/day: 2.00    Years: 30.00    Total pack years: 60.00    Types: Cigarettes    Quit date: 11/04/2013    Years since quitting: 8.5   Smokeless tobacco: Never  Vaping Use   Vaping Use: Never used  Substance and Sexual Activity   Alcohol use: No   Drug use: No   Sexual activity: Not Currently  Other Topics Concern   Not on file  Social History Narrative   Not on file   Social Determinants of Health   Financial Resource Strain: Low Risk  (09/27/2020)   Overall Financial Resource Strain (CARDIA)    Difficulty of Paying Living Expenses: Not hard at all  Food Insecurity: No Food Insecurity (09/27/2020)   Hunger Vital Sign    Worried About Running Out of Food in the Last Year: Never true    Ran Out of Food in the Last Year: Never true  Transportation Needs: No Transportation Needs (09/27/2020)   PRAPARE - Hydrologist (Medical): No    Lack of Transportation (Non-Medical): No  Physical Activity: Inactive (09/27/2020)   Exercise Vital Sign    Days of Exercise per Week: 0 days    Minutes of Exercise per Session: 0 min  Stress: No Stress Concern Present (09/27/2020)   [place]    Feeling of Stress : Not at all  Social Connections: Socially Isolated (09/27/2020)   Social Connection and Isolation Panel [NHANES]    Frequency of Communication with Friends and Family: More than three times a week    Frequency of Social Gatherings with Friends and Family: Never    Attends Religious Services: Never    Corporate treasurer or Organizations: No    Attends Archivist Meetings: Never    Marital Status: Divorced  Human resources officer  Violence: Not At Risk (09/27/2020)   Humiliation, Afraid, Rape, and Kick questionnaire    Fear of Current or Ex-Partner: No    Emotionally Abused: No    Physically Abused: No    Sexually Abused: No    Review of Systems:  As in history of present illness  Physical Exam: Vital signs in last 24 hours: Temp:  [97.7 F (36.5 C)-98 F (36.7 C)] 97.8 F (36.6 C) (06/17 0729) Pulse Rate:  [87-133] 110 (06/17 0729) Resp:  [16-30] 23 (06/17 0729) BP: (90-151)/(52-99) 151/71 (06/17 0729) SpO2:  [90 %-100 %] 98 % (06/17 0729) Weight:  [56.7 kg-56.8 kg] 56.8 kg (06/16 2115) Last BM Date : 05/02/22 General:   Frail appearing, alert and conversant lady-appears in no acute distress. Head:  Normocephalic and atraumatic. Eyes:  Sclera clear, no icterus.   Conjunctiva pink. Lungs:  Clear throughout to auscultation.   No wheezes, crackles, or rhonchi. No acute distress. Heart:  Regular rate and rhythm; no murmurs, clicks, rubs,  or gallops. Abdomen: Nondistended.  Positive bowel sounds soft and nontender  Intake/Output from previous day: 06/16 0701 - 06/17 0700 In: 1000 [IV Piggyback:1000] Out: -  Intake/Output this shift: Total I/O In: 50 [P.O.:50] Out: -   Lab Results: Recent Labs    05/03/22 1251 05/03/22 2033 05/04/22 0325  WBC 7.6 8.1 6.6  HGB 9.2* 8.9* 8.9*  HCT 29.0* 28.0* 28.7*  PLT 201 187 194   BMET Recent Labs    05/03/22 1251  NA 138  K 3.7  CL 94*  CO2 35*  GLUCOSE 115*  BUN 17  CREATININE 0.71  CALCIUM 8.9   LFT Recent Labs    05/03/22 1251  PROT 6.1*  ALBUMIN 3.4*  AST 22  ALT 11  ALKPHOS 77  BILITOT 0.2*   PT/INR No results for input(s): "LABPROT", "INR" in the last 72 hours. Hepatitis Panel No results for input(s): "HEPBSAG", "HCVAB", "HEPAIGM", "HEPBIGM" in the last 72 hours. C-Diff No results for input(s): "CDIFFTOX"  in the last 72 hours.  Studies/Results: CT Angio Chest PE W and/or Wo Contrast  Result Date: 05/03/2022 CLINICAL DATA:  Chronic shortness of breath, worsened this morning with midsternal chest pain EXAM: CT ANGIOGRAPHY CHEST WITH CONTRAST TECHNIQUE: Multidetector CT imaging of the chest was performed using the standard protocol during bolus administration of intravenous contrast. Multiplanar CT image reconstructions and MIPs were obtained to evaluate the vascular anatomy. RADIATION DOSE REDUCTION: This exam was performed according to the departmental dose-optimization program which includes automated exposure control, adjustment of the mA and/or kV according to patient size and/or use of iterative reconstruction technique. CONTRAST:  87m OMNIPAQUE IOHEXOL 350 MG/ML SOLN COMPARISON:  Chest CT dated February 19, 2022 FINDINGS: Cardiovascular: No evidence of pulmonary embolus, evaluation of the segmental pulmonary arteries is somewhat limited due to contrast timing. Normal heart size. No pericardial effusion. Mild three-vessel coronary artery calcifications. Atherosclerotic disease of the thoracic aorta. Mediastinum/Nodes: Moderate to severe circumferential wall thickening of the lower esophagus, new when compared to prior exam. Thyroid is unremarkable. Lungs/Pleura: Central airways are patent. Bronchiectasis of the left lower lobe and lingula. Bronchiectasis of the lingula and left lower lobe with clustered centrilobular nodules which are increased in the area of the lingula, largest solid nodule measures 5 mm on series 7, image 63. Unchanged linear opacities of the bilateral upper lobes, likely due to scarring. No consolidation, pleural effusion or pneumothorax. Upper Abdomen: No acute abnormality. Musculoskeletal: No chest wall abnormality. No acute or significant osseous  findings. Review of the MIP images confirms the above findings. IMPRESSION: 1. No evidence of pulmonary embolus. 2. Moderate to severe  circumferential wall thickening of the distal esophagus which is new when compared with prior February 19, 2022 exam, likely due to esophagitis. Recommend endoscopy for further evaluation. 3. Bronchiectasis of the lingula and left lower lobe with clustered centrilobular nodules which are increased in the area of the lingula, likely due to worsening chronic atypical infection, recurrent aspiration is an additional consideration. Recommend follow-up chest CT in 6 months. 4. Aortic Atherosclerosis (ICD10-I70.0) and Emphysema (ICD10-J43.9). Electronically Signed   By: Yetta Glassman M.D.   On: 05/03/2022 15:18   DG Chest Port 1 View  Result Date: 05/03/2022 CLINICAL DATA:  Shortness of breath and vomiting. History of COPD and ovarian cancer. EXAM: PORTABLE CHEST 1 VIEW COMPARISON:  Chest radiograph 03/10/2022 FINDINGS: A left subclavian Port-A-Cath remains in place, terminating over the lower SVC. The cardiomediastinal silhouette is unchanged with normal heart size. Aortic atherosclerosis is noted. Scarring in the lung apices and left lung base is unchanged, as is chronic interstitial coarsening diffusely. No acute airspace consolidation, overt pulmonary edema, sizable pleural effusion, or pneumothorax is identified. No acute osseous abnormality is seen. IMPRESSION: No active disease. Electronically Signed   By: Logan Bores M.D.   On: 05/03/2022 12:57     Impression: Unfortunate 67 year old lady with stage IV ovarian cancer, known peritoneal carcinomatosis, O2 dependent COPD admitted to the hospital with what appears to be an upper GI bleed.  She is currently hemodynamically stable and her hemoglobin is holding steady without transfusion being required.    She has new esophageal wall thickening on cross-sectional imaging.  Aside from her presenting symptom of nausea and vomiting, she really has no other GI symptoms.  Candida esophagitis found last year.  She denies any symptoms consistent with dysphagia or  odynophagia.  She is on low-dose PPI therapy.  Naprosyn and corticosteroids puts her at risk for acid peptic disease.  Less likely a Mallory-Weiss tear or uncontrolled reflux.  However,  these entities do remain in the differential as does recurrent Candida infection..  Recommendations:  Agree with ICU support at this time and IV PPI therapy  I have offered the patient a diagnostic EGD later today.  The risks, benefits, limitations, alternatives and imponderables have been reviewed with the patient. Potential for esophageal dilation, biopsy, etc. have also been reviewed.  Questions have been answered.  She is agreeable.  ASA 4.  Further recommendations to follow after endoscopic evaluation has been carried out.       Notice:  This dictation was prepared with Dragon dictation along with smaller phrase technology. Any transcriptional errors that result from this process are unintentional and may not be corrected upon review.

## 2022-05-04 NOTE — Op Note (Signed)
[facility] Patient Name: [name] Procedure Date: 05/04/2022 11:56 AM MRN: [id number] Date of Birth: [birth year] Attending MD: Norvel Richards , MD CSN: [id number] Age: 66 Admit Type: Inpatient Procedure:                Upper GI endoscopy Indications:              Coffee-ground emesis, Heme positive stool Providers:                Norvel Richards, MD, Charlsie Quest. Theda Sers RN, RN,                            Selby Page Referring MD:              Medicines:                Propofol per Anesthesia Complications:            No immediate complications. Estimated Blood Loss:     Estimated blood loss: none. Procedure:                Pre-Anesthesia Assessment:                           - Prior to the procedure, a History and Physical                            was performed, and patient medications and                            allergies were reviewed. The patient's tolerance of                            previous anesthesia was also reviewed. The risks                            and benefits of the procedure and the sedation                            options and risks were discussed with the patient.                            All questions were answered, and informed consent                            was obtained. Prior Anticoagulants: The patient has                            taken no previous anticoagulant or antiplatelet                            agents. ASA Grade Assessment: IV - A patient with                            severe systemic disease that is a constant threat  to life. After reviewing the risks and benefits,                            the patient was deemed in satisfactory condition to                            undergo the procedure.                           After obtaining informed consent, the endoscope was                            passed under direct vision. Throughout the                            procedure, the  patient's blood pressure, pulse, and                            oxygen saturations were monitored continuously. The                            [id number]-[id number] ([id number]) scope was introduced through the                            mouth, and advanced to the second part of duodenum.                            The upper GI endoscopy was accomplished without                            difficulty. The patient tolerated the procedure                            well. Scope In: 12:14:50 PM Scope Out: 12:18:55 PM Total Procedure Duration: 0 hours 4 minutes 5 seconds  Findings:      Circumferential erosions straddling the GE junction with four-quadrant       linear erosions extending up 6 cm in the tubular esophagus. No exudate       or plaques. Tubular esophagus patent throughout its course. Moderate       size hiatal hernia; normal-appearing gastric mucosa. Patent pylorus.      The duodenal bulb and second portion of the duodenum were normal. Impression:               - Moderately severe, fairly extensive erosive                            reflux esophagitis - likely source of coffee-ground                            emesis.                           - Moderate size hiatal hernia; otherwise normal  stomach                           -Normal duodenal bulb and second portion of the                            duodenum.                           - No specimens collected. Moderate Sedation:      Moderate (conscious) sedation was personally administered by an       anesthesia professional. The following parameters were monitored: oxygen       saturation, heart rate, blood pressure, respiratory rate, EKG, adequacy       of pulmonary ventilation, and response to care. Recommendation:           - Return patient to ICU for ongoing care. Protonix                            40 mg orally twice daily x3 months; Carafate 1 g                            suspension 4 times daily x2 weeks.  Follow-up with                            Dr. Jenetta Downer in 10 to 12 weeks.                           - Could come out of the ICU anytime. May be                            dischargeable in the next 24 hours.                           - Advance diet as tolerated.                           - Continue present medications. Procedure Code(s):        --- Professional ---                           [phone], Esophagogastroduodenoscopy, flexible,                            transoral; diagnostic, including collection of                            specimen(s) by brushing or washing, when performed                            (separate procedure) Diagnosis Code(s):        --- Professional ---                           K92.0, Hematemesis  R19.5, Other fecal abnormalities CPT copyright 2019 American Medical Association. All rights reserved. The codes documented in this report are preliminary and upon coder review may  be revised to meet current compliance requirements. [name]. [name], MD Norvel Richards, MD 05/04/2022 12:33:32 PM This report has been signed electronically. Number of Addenda: 0

## 2022-05-04 NOTE — Progress Notes (Signed)
Patient is a transfer from ICU to room 316, vital signs stable. Family at bedside. No c/o pain or discomfort noted.[name]

## 2022-05-04 NOTE — Anesthesia Preprocedure Evaluation (Signed)
Anesthesia Evaluation  Patient identified by MRN, date of birth, ID band Patient awake    Reviewed: Allergy & Precautions, H&P , NPO status , Patient's Chart, lab work & pertinent test results, reviewed documented beta blocker date and time   Airway Mallampati: II  TM Distance: >3 FB Neck ROM: full    Dental no notable dental hx.    Pulmonary asthma , COPD, former smoker,    Pulmonary exam normal breath sounds clear to auscultation       Cardiovascular Exercise Tolerance: Good hypertension, + Peripheral Vascular Disease   Rhythm:regular Rate:Normal     Neuro/Psych  Neuromuscular disease negative psych ROS   GI/Hepatic negative GI ROS, Neg liver ROS,   Endo/Other  negative endocrine ROS  Renal/GU negative Renal ROS  negative genitourinary   Musculoskeletal   Abdominal   Peds  Hematology  (+) Blood dyscrasia, anemia ,   Anesthesia Other Findings   Reproductive/Obstetrics negative OB ROS                             Anesthesia Physical Anesthesia Plan  ASA: 4 and emergent  Anesthesia Plan: General   Post-op Pain Management:    Induction:   PONV Risk Score and Plan: Propofol infusion  Airway Management Planned:   Additional Equipment:   Intra-op Plan:   Post-operative Plan:   Informed Consent: I have reviewed the patients History and Physical, chart, labs and discussed the procedure including the risks, benefits and alternatives for the proposed anesthesia with the patient or authorized representative who has indicated his/her understanding and acceptance.     Dental Advisory Given  Plan Discussed with: CRNA  Anesthesia Plan Comments:         Anesthesia Quick Evaluation

## 2022-05-05 DIAGNOSIS — K921 Melena: Secondary | ICD-10-CM | POA: Diagnosis not present

## 2022-05-05 LAB — BASIC METABOLIC PANEL
Anion gap: 6 (ref 5–15)
BUN: 15 mg/dL (ref 8–23)
CO2: 34 mmol/L — ABNORMAL HIGH (ref 22–32)
Calcium: 8.5 mg/dL — ABNORMAL LOW (ref 8.9–10.3)
Chloride: 100 mmol/L (ref 98–111)
Creatinine, Ser: 0.73 mg/dL (ref 0.44–1.00)
GFR, Estimated: >60 mL/min (ref >60)
Glucose, Bld: 99 mg/dL (ref 70–99)
Potassium: 3.1 mmol/L — ABNORMAL LOW (ref 3.5–5.1)
Sodium: 140 mmol/L (ref 135–145)

## 2022-05-05 LAB — CBC
HCT: 25.7 % — ABNORMAL LOW (ref 36.0–46.0)
Hemoglobin: 8.2 g/dL — ABNORMAL LOW (ref 12.0–15.0)
MCH: 31.4 pg (ref 26.0–34.0)
MCHC: 31.9 g/dL (ref 30.0–36.0)
MCV: 98.5 fL (ref 80.0–100.0)
Platelets: 198 10*3/uL (ref 150–400)
RBC: 2.61 MIL/uL — ABNORMAL LOW (ref 3.87–5.11)
RDW: 15.9 % — ABNORMAL HIGH (ref 11.5–15.5)
WBC: 4.9 10*3/uL (ref 4.0–10.5)
nRBC: 0 % (ref 0.0–0.2)

## 2022-05-05 LAB — MAGNESIUM: Magnesium: 1.7 mg/dL (ref 1.7–2.4)

## 2022-05-05 MED ORDER — PANTOPRAZOLE SODIUM 40 MG PO TBEC: 40.0000 mg | DELAYED_RELEASE_TABLET | Freq: Two times a day (BID) | ORAL | 0 refills | Status: DC

## 2022-05-05 MED ORDER — SUCRALFATE 1 GM/10ML PO SUSP
1.0000 g | Freq: Three times a day (TID) | ORAL | 0 refills | Status: DC
Start: 2022-05-05 — End: 2022-07-01

## 2022-05-05 NOTE — Discharge Summary (Signed)
Physician Discharge Summary  [name] UVO:[id number] DOB: 09-20-1956 DOA: 05/03/2022  PCP: [name], MD  Admit date: 05/03/2022  Discharge date: 05/05/2022  Admitted From:Home  Disposition:  Home  Recommendations for Outpatient Follow-up:  Follow up with PCP in 1-2 weeks Follow-up with Dr. Jenetta Downer as recommended in 10-12 weeks Remain on PPI twice daily for the next 3 months as prescribed Remain on Carafate for the next 2 weeks as prescribed Continue on home medications as noted below and avoid NSAIDs  Home Health: None  Equipment/Devices: Has home nasal cannula oxygen 3 L  Discharge Condition:Stable  CODE STATUS: Full  Diet recommendation: Heart Healthy  Brief/Interim Summary: [name] is a 66 y.o. 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  She was admitted with hematemesis and has undergone EGD on 6/17 demonstrating findings of erosive reflux esophagitis.  She has done well overnight and is tolerating diet with no further hematemesis noted and hemoglobin levels have remained fairly stable.  She has not required PRBC transfusion.  Recommendations are as noted above and she is stable for discharge today.  Discharge Diagnoses:  Principal Problem:   Hematochezia Active Problems:   COPD (chronic obstructive pulmonary disease) (HCC)   Hypertension   Chronic respiratory failure with hypoxia (HCC)   Ovarian cancer (HCC)   Upper GI bleed   Deficiency anemia  Principal discharge diagnosis: Acute upper GI bleed secondary to moderate-severe reflux esophagitis.  Chronic anemia secondary to GI bleed/iron deficiency anemia.  Discharge Instructions  Discharge Instructions     Ambulatory referral to Gastroenterology   Complete by: As directed    Follow up esophagitis.   What is the reason for  referral?: Other   Diet - low sodium heart healthy   Complete by: As directed    Increase activity slowly   Complete by: As directed       Allergies as of 05/05/2022   No Known Allergies      Medication List     STOP taking these medications    naproxen sodium 220 MG tablet Commonly known as: ALEVE   Omeprazole-Sodium Bicarbonate 20-1100 MG Caps capsule Commonly known as: ZEGERID       TAKE these medications    albuterol 108 (90 Base) MCG/ACT inhaler Commonly known as: VENTOLIN HFA Inhale 2 puffs into the lungs every 6 (six) hours as needed for wheezing or shortness of breath.   amLODipine 5 MG tablet Commonly known as: NORVASC Take 5 mg by mouth at bedtime.   aspirin EC 81 MG tablet Take 81 mg by mouth daily.   atorvastatin 40 MG tablet Commonly known as: LIPITOR Take 1 tablet (40 mg total) by mouth daily.   ferrous sulfate 325 (65 FE) MG tablet Take 325 mg by mouth every other day.   ipratropium 17 MCG/ACT inhaler Commonly known as: ATROVENT HFA Inhale 2 puffs into the lungs every 6 (six) hours as needed for wheezing.   levalbuterol 1.25 MG/3ML nebulizer solution Commonly known as: XOPENEX Inhale into the lungs.   magnesium oxide 400 (240 Mg) MG tablet Commonly known as: MAG-OX Take 1 tablet by mouth twice daily   MUCOMYST-10 IN Inhale 1 vial into the lungs every three (3) days as needed (copd).   nortriptyline 25 MG capsule Commonly known as: PAMELOR Take 25 mg by mouth at bedtime.   OXYGEN Inhale 3-4 L into the lungs continuous.  pantoprazole 40 MG tablet Commonly known as: Protonix Take 1 tablet (40 mg total) by mouth 2 (two) times daily. What changed: when to take this   predniSONE 5 MG tablet Commonly known as: DELTASONE Take 5 mg by mouth daily with breakfast. What changed: Another medication with the same name was removed. Continue taking this medication, and follow the directions you see here.   primidone 50 MG tablet Commonly  known as: MYSOLINE Take 50 mg by mouth at bedtime.   sucralfate 1 GM/10ML suspension Commonly known as: CARAFATE Take 10 mLs (1 g total) by mouth 4 (four) times daily -  with meals and at bedtime for 14 days.   Trelegy Ellipta 100-62.5-25 MCG/ACT Aepb Generic drug: Fluticasone-Umeclidin-Vilant Inhale into the lungs.   Zejula 100 MG capsule Generic drug: niraparib tosylate Take 2 capsules (200 mg total) by mouth daily. May take at bedtime to reduce nausea and vomiting.        Follow-up Information     [name], MD. Schedule an appointment as soon as possible for a visit in 1 week(s).   Specialty: Family Medicine Contact information: [place] [address] [phone]         Montez Morita, Quillian Quince, MD. Schedule an appointment as soon as possible for a visit in 12 week(s).   Specialty: Gastroenterology Contact information: [address] [place] [address] [phone]                No Known Allergies  Consultations: GI   Procedures/Studies: CT Angio Chest PE W and/or Wo Contrast  Result Date: 05/03/2022 CLINICAL DATA:  Chronic shortness of breath, worsened this morning with midsternal chest pain EXAM: CT ANGIOGRAPHY CHEST WITH CONTRAST TECHNIQUE: Multidetector CT imaging of the chest was performed using the standard protocol during bolus administration of intravenous contrast. Multiplanar CT image reconstructions and MIPs were obtained to evaluate the vascular anatomy. RADIATION DOSE REDUCTION: This exam was performed according to the departmental dose-optimization program which includes automated exposure control, adjustment of the mA and/or kV according to patient size and/or use of iterative reconstruction technique. CONTRAST:  62m OMNIPAQUE IOHEXOL 350 MG/ML SOLN COMPARISON:  Chest CT dated February 19, 2022 FINDINGS: Cardiovascular: No evidence of pulmonary embolus, evaluation of the segmental pulmonary arteries is  somewhat limited due to contrast timing. Normal heart size. No pericardial effusion. Mild three-vessel coronary artery calcifications. Atherosclerotic disease of the thoracic aorta. Mediastinum/Nodes: Moderate to severe circumferential wall thickening of the lower esophagus, new when compared to prior exam. Thyroid is unremarkable. Lungs/Pleura: Central airways are patent. Bronchiectasis of the left lower lobe and lingula. Bronchiectasis of the lingula and left lower lobe with clustered centrilobular nodules which are increased in the area of the lingula, largest solid nodule measures 5 mm on series 7, image 63. Unchanged linear opacities of the bilateral upper lobes, likely due to scarring. No consolidation, pleural effusion or pneumothorax. Upper Abdomen: No acute abnormality. Musculoskeletal: No chest wall abnormality. No acute or significant osseous findings. Review of the MIP images confirms the above findings. IMPRESSION: 1. No evidence of pulmonary embolus. 2. Moderate to severe circumferential wall thickening of the distal esophagus which is new when compared with prior February 19, 2022 exam, likely due to esophagitis. Recommend endoscopy for further evaluation. 3. Bronchiectasis of the lingula and left lower lobe with clustered centrilobular nodules which are increased in the area of the lingula, likely due to worsening chronic atypical infection, recurrent aspiration is an additional consideration.  Recommend follow-up chest CT in 6 months. 4. Aortic Atherosclerosis (ICD10-I70.0) and Emphysema (ICD10-J43.9). Electronically Signed   By: Yetta Glassman M.D.   On: 05/03/2022 15:18   DG Chest Port 1 View  Result Date: 05/03/2022 CLINICAL DATA:  Shortness of breath and vomiting. History of COPD and ovarian cancer. EXAM: PORTABLE CHEST 1 VIEW COMPARISON:  Chest radiograph 03/10/2022 FINDINGS: A left subclavian Port-A-Cath remains in place, terminating over the lower SVC. The cardiomediastinal silhouette is  unchanged with normal heart size. Aortic atherosclerosis is noted. Scarring in the lung apices and left lung base is unchanged, as is chronic interstitial coarsening diffusely. No acute airspace consolidation, overt pulmonary edema, sizable pleural effusion, or pneumothorax is identified. No acute osseous abnormality is seen. IMPRESSION: No active disease. Electronically Signed   By: Logan Bores M.D.   On: 05/03/2022 12:57     Discharge Exam: Vitals:   05/05/22 0616 05/05/22 0737  BP: 103/66   Pulse: 87   Resp:    Temp: (!) 97.4 F (36.3 C)   SpO2: 100% 98%   Vitals:   05/04/22 2154 05/05/22 0210 05/05/22 0616 05/05/22 0737  BP: (!) 130/57 (!) 106/52 103/66   Pulse: (!) 105 93 87   Resp: 20     Temp: 98.2 F (36.8 C) 98 F (36.7 C) (!) 97.4 F (36.3 C)   TempSrc: Oral Oral Oral   SpO2: 97% 99% 100% 98%  Weight:      Height:        General: Pt is alert, awake, not in acute distress Cardiovascular: RRR, S1/S2 +, no rubs, no gallops Respiratory: CTA bilaterally, no wheezing, no rhonchi, on 3 L nasal cannula Abdominal: Soft, NT, ND, bowel sounds + Extremities: no edema, no cyanosis    The results of significant diagnostics from this hospitalization (including imaging, microbiology, ancillary and laboratory) are listed below for reference.     Microbiology: Recent Results (from the past 240 hour(s))  MRSA Next Gen by PCR, Nasal     Status: None   Collection Time: 05/03/22  8:57 PM   Specimen: Nasal Mucosa; Nasal Swab  Result Value Ref Range Status   MRSA by PCR Next Gen NOT DETECTED NOT DETECTED Final    Comment: (NOTE) The GeneXpert MRSA Assay (FDA approved for NASAL specimens only), is one component of a comprehensive MRSA colonization surveillance program. It is not intended to diagnose MRSA infection nor to guide or monitor treatment for MRSA infections. Test performance is not FDA approved in patients less than 47 years old. Performed at [facility], [address] [place], [place] [address]      Labs: BNP (last 3 results) Recent Labs    12/30/21 0700  BNP 19.1   Basic Metabolic Panel: Recent Labs  Lab 05/03/22 1251 05/05/22 0536  NA 138 140  K 3.7 3.1*  CL 94* 100  CO2 35* 34*  GLUCOSE 115* 99  BUN 17 15  CREATININE 0.71 0.73  CALCIUM 8.9 8.5*  MG  --  1.7   Liver Function Tests: Recent Labs  Lab 05/03/22 1251  AST 22  ALT 11  ALKPHOS 77  BILITOT 0.2*  PROT 6.1*  ALBUMIN 3.4*   Recent Labs  Lab 05/03/22 1251  LIPASE 23   No results for input(s): "AMMONIA" in the last 168 hours. CBC: Recent Labs  Lab 05/03/22 1251 05/03/22 2033 05/04/22 0325 05/05/22 0536  WBC 7.6 8.1 6.6 4.9  NEUTROABS 5.6  --   --   --  HGB 9.2* 8.9* 8.9* 8.2*  HCT 29.0* 28.0* 28.7* 25.7*  MCV 99.3 97.2 98.0 98.5  PLT 201 187 194 198   Cardiac Enzymes: No results for input(s): "CKTOTAL", "CKMB", "CKMBINDEX", "TROPONINI" in the last 168 hours. BNP: Invalid input(s): "POCBNP" CBG: No results for input(s): "GLUCAP" in the last 168 hours. D-Dimer No results for input(s): "DDIMER" in the last 72 hours. Hgb A1c No results for input(s): "HGBA1C" in the last 72 hours. Lipid Profile No results for input(s): "CHOL", "HDL", "LDLCALC", "TRIG", "CHOLHDL", "LDLDIRECT" in the last 72 hours. Thyroid function studies No results for input(s): "TSH", "T4TOTAL", "T3FREE", "THYROIDAB" in the last 72 hours.  Invalid input(s): "FREET3" Anemia work up No results for input(s): "VITAMINB12", "FOLATE", "FERRITIN", "TIBC", "IRON", "RETICCTPCT" in the last 72 hours. Urinalysis    Component Value Date/Time   COLORURINE STRAW (A) 03/10/2022 1232   APPEARANCEUR HAZY (A) 03/10/2022 1232   APPEARANCEUR Clear 10/08/2013 2007   LABSPEC 1.031 (H) 03/10/2022 1232   LABSPEC 1.003 10/08/2013 2007   PHURINE 6.0 03/10/2022 1232   GLUCOSEU NEGATIVE 03/10/2022 1232   GLUCOSEU Negative 10/08/2013 2007   HGBUR SMALL (A) 03/10/2022 1232   BILIRUBINUR NEGATIVE  03/10/2022 1232   BILIRUBINUR Negative 10/08/2013 2007   KETONESUR NEGATIVE 03/10/2022 1232   PROTEINUR NEGATIVE 03/10/2022 1232   NITRITE NEGATIVE 03/10/2022 1232   LEUKOCYTESUR LARGE (A) 03/10/2022 1232   LEUKOCYTESUR Negative 10/08/2013 2007   Sepsis Labs Recent Labs  Lab 05/03/22 1251 05/03/22 2033 05/04/22 0325 05/05/22 0536  WBC 7.6 8.1 6.6 4.9   Microbiology Recent Results (from the past 240 hour(s))  MRSA Next Gen by PCR, Nasal     Status: None   Collection Time: 05/03/22  8:57 PM   Specimen: Nasal Mucosa; Nasal Swab  Result Value Ref Range Status   MRSA by PCR Next Gen NOT DETECTED NOT DETECTED Final    Comment: (NOTE) The GeneXpert MRSA Assay (FDA approved for NASAL specimens only), is one component of a comprehensive MRSA colonization surveillance program. It is not intended to diagnose MRSA infection nor to guide or monitor treatment for MRSA infections. Test performance is not FDA approved in patients less than 48 years old. Performed at [facility], [address] [place], [place] [address]      Time coordinating discharge: 35 minutes  SIGNED:   Rodena Goldmann, DO [facility] 05/05/2022, 9:22 AM  If 7PM-7AM, please contact night-coverage [email or web address]

## 2022-05-05 NOTE — Progress Notes (Signed)
Patient doing well today.  Tolerated last to regular meals without any difficulty.  No pain.  No nausea or vomiting.  1 small dark stool yesterday afternoon Looking forward to going home  EGD findings reviewed with her once again.  Vital signs in last 24 hours: Temp:  [97.3 F (36.3 C)-98.6 F (37 C)] 97.4 F (36.3 C) (06/18 0616) Pulse Rate:  [87-110] 87 (06/18 0616) Resp:  [19-23] 20 (06/17 2154) BP: (91-136)/(52-77) 103/66 (06/18 0616) SpO2:  [97 %-100 %] 98 % (06/18 0737) Last BM Date : 05/02/22 General:   Alert,   pleasant and cooperative in NAD; accompanied by friend. Abdomen: Nondistended.  Positive bowel sounds soft and nontender  extremities:  Without clubbing or edema.    Intake/Output from previous day: 06/17 0701 - 06/18 0700 In: 250 [P.O.:50; I.V.:200] Out: -  Intake/Output this shift: No intake/output data recorded.  Lab Results: Recent Labs    05/03/22 2033 05/04/22 0325 05/05/22 0536  WBC 8.1 6.6 4.9  HGB 8.9* 8.9* 8.2*  HCT 28.0* 28.7* 25.7*  PLT 187 194 198   BMET Recent Labs    05/03/22 1251 05/05/22 0536  NA 138 140  K 3.7 3.1*  CL 94* 100  CO2 35* 34*  GLUCOSE 115* 99  BUN 17 15  CREATININE 0.71 0.73  CALCIUM 8.9 8.5*   LFT Recent Labs    05/03/22 1251  PROT 6.1*  ALBUMIN 3.4*  AST 22  ALT 11  ALKPHOS 77  BILITOT 0.2*    Impression: Very pleasant 66 year old lady with stage IV ovarian cancer presenting with coffee-ground emesis Hemoccult positive stool and decline in hemoglobin.  EGD yesterday demonstrated fairly severe erosive reflux esophagitis  - likely cause of bleeding.  She has done done well overnight.  Clinically, no further bleeding.  This morning's morning's hemoglobin noted 8.1; potassium 3.1  Current medical regimen includes twice daily Protonix and Carafate.  Recommendations:  Continue her on Protonix 40 mg twice daily (ideally taken before breakfast and supper)  Carafate 1 g suspension 4 times daily x2  weeks  Follow-up with Dr. Jenetta Downer in about 10 to 12 weeks  I feel the patient can probably go home in the next 24 hours from a GI standpoint.  Would consider assessing stability of hemoglobin with one assay later today if she is discharged today. Management of hypokalemia per attending.

## 2022-05-05 NOTE — Progress Notes (Signed)
done

## 2022-05-06 ENCOUNTER — Other Ambulatory Visit (HOSPITAL_COMMUNITY): Payer: Self-pay

## 2022-05-07 ENCOUNTER — Encounter (HOSPITAL_COMMUNITY): Payer: Self-pay | Admitting: Internal Medicine

## 2022-05-10 ENCOUNTER — Other Ambulatory Visit (HOSPITAL_COMMUNITY): Payer: Self-pay

## 2022-05-15 ENCOUNTER — Other Ambulatory Visit (HOSPITAL_COMMUNITY): Payer: Self-pay

## 2022-05-16 ENCOUNTER — Other Ambulatory Visit (HOSPITAL_COMMUNITY): Payer: Self-pay

## 2022-06-03 ENCOUNTER — Other Ambulatory Visit (HOSPITAL_COMMUNITY): Payer: Self-pay

## 2022-06-04 ENCOUNTER — Ambulatory Visit (HOSPITAL_COMMUNITY)
Admission: RE | Admit: 2022-06-04 | Discharge: 2022-06-04 | Disposition: A | Discharge status: Home / Self Care | Payer: Medicare HMO | Source: Ambulatory Visit | Attending: Hematology | Admitting: Hematology

## 2022-06-04 ENCOUNTER — Inpatient Hospital Stay (HOSPITAL_COMMUNITY): Payer: Medicare HMO | Attending: Hematology

## 2022-06-04 ENCOUNTER — Other Ambulatory Visit (HOSPITAL_COMMUNITY): Payer: Self-pay

## 2022-06-04 ENCOUNTER — Other Ambulatory Visit (HOSPITAL_COMMUNITY): Payer: Medicare HMO

## 2022-06-04 DIAGNOSIS — G629 Polyneuropathy, unspecified: Secondary | ICD-10-CM | POA: Insufficient documentation

## 2022-06-04 DIAGNOSIS — I10 Essential (primary) hypertension: Secondary | ICD-10-CM | POA: Diagnosis not present

## 2022-06-04 DIAGNOSIS — Z8042 Family history of malignant neoplasm of prostate: Secondary | ICD-10-CM | POA: Insufficient documentation

## 2022-06-04 DIAGNOSIS — Z87891 Personal history of nicotine dependence: Secondary | ICD-10-CM | POA: Insufficient documentation

## 2022-06-04 DIAGNOSIS — I700 Atherosclerosis of aorta: Secondary | ICD-10-CM | POA: Diagnosis not present

## 2022-06-04 DIAGNOSIS — R058 Other specified cough: Secondary | ICD-10-CM | POA: Diagnosis not present

## 2022-06-04 DIAGNOSIS — D259 Leiomyoma of uterus, unspecified: Secondary | ICD-10-CM | POA: Insufficient documentation

## 2022-06-04 DIAGNOSIS — R5383 Other fatigue: Secondary | ICD-10-CM | POA: Diagnosis not present

## 2022-06-04 DIAGNOSIS — C786 Secondary malignant neoplasm of retroperitoneum and peritoneum: Secondary | ICD-10-CM | POA: Diagnosis not present

## 2022-06-04 DIAGNOSIS — J449 Chronic obstructive pulmonary disease, unspecified: Secondary | ICD-10-CM | POA: Diagnosis not present

## 2022-06-04 DIAGNOSIS — C569 Malignant neoplasm of unspecified ovary: Secondary | ICD-10-CM | POA: Insufficient documentation

## 2022-06-04 LAB — MAGNESIUM: Magnesium: 2 mg/dL (ref 1.7–2.4)

## 2022-06-04 LAB — CBC WITH DIFFERENTIAL/PLATELET
Abs Immature Granulocytes: 0.01 10*3/uL (ref 0.00–0.07)
Basophils Absolute: 0.1 10*3/uL (ref 0.0–0.1)
Basophils Relative: 1 %
Eosinophils Absolute: 0.2 10*3/uL (ref 0.0–0.5)
Eosinophils Relative: 3 %
HCT: 31.6 % — ABNORMAL LOW (ref 36.0–46.0)
Hemoglobin: 10.1 g/dL — ABNORMAL LOW (ref 12.0–15.0)
Immature Granulocytes: 0 %
Lymphocytes Relative: 25 %
Lymphs Abs: 1.2 10*3/uL (ref 0.7–4.0)
MCH: 31.5 pg (ref 26.0–34.0)
MCHC: 32 g/dL (ref 30.0–36.0)
MCV: 98.4 fL (ref 80.0–100.0)
Monocytes Absolute: 0.7 10*3/uL (ref 0.1–1.0)
Monocytes Relative: 15 %
Neutro Abs: 2.7 10*3/uL (ref 1.7–7.7)
Neutrophils Relative %: 56 %
Platelets: 145 10*3/uL — ABNORMAL LOW (ref 150–400)
RBC: 3.21 MIL/uL — ABNORMAL LOW (ref 3.87–5.11)
RDW: 15.8 % — ABNORMAL HIGH (ref 11.5–15.5)
WBC: 4.8 10*3/uL (ref 4.0–10.5)
nRBC: 0 % (ref 0.0–0.2)

## 2022-06-04 LAB — COMPREHENSIVE METABOLIC PANEL
ALT: 16 U/L (ref 0–44)
AST: 28 U/L (ref 15–41)
Albumin: 3.8 g/dL (ref 3.5–5.0)
Alkaline Phosphatase: 88 U/L (ref 38–126)
Anion gap: 9 (ref 5–15)
BUN: 9 mg/dL (ref 8–23)
CO2: 33 mmol/L — ABNORMAL HIGH (ref 22–32)
Calcium: 9 mg/dL (ref 8.9–10.3)
Chloride: 95 mmol/L — ABNORMAL LOW (ref 98–111)
Creatinine, Ser: 0.73 mg/dL (ref 0.44–1.00)
GFR, Estimated: >60 mL/min (ref >60)
Glucose, Bld: 102 mg/dL — ABNORMAL HIGH (ref 70–99)
Potassium: 4.4 mmol/L (ref 3.5–5.1)
Sodium: 137 mmol/L (ref 135–145)
Total Bilirubin: 0.6 mg/dL (ref 0.3–1.2)
Total Protein: 6.9 g/dL (ref 6.5–8.1)

## 2022-06-04 MED ORDER — IOHEXOL 300 MG/ML  SOLN
100.0000 mL | Freq: Once | INTRAMUSCULAR | Status: AC | PRN
  Administered 2022-06-04: 100 mL via INTRAVENOUS

## 2022-06-05 LAB — CA 125: Cancer Antigen (CA) 125: 35.5 U/mL (ref 0.0–38.1)

## 2022-06-10 ENCOUNTER — Other Ambulatory Visit: Payer: Self-pay

## 2022-06-11 ENCOUNTER — Inpatient Hospital Stay (HOSPITAL_COMMUNITY): Payer: Medicare HMO | Admitting: Hematology

## 2022-06-11 VITALS — BP 119/70 | HR 105 | Temp 98.3°F | Resp 19 | Ht 60.0 in | Wt 122.6 lb

## 2022-06-11 DIAGNOSIS — C569 Malignant neoplasm of unspecified ovary: Secondary | ICD-10-CM

## 2022-06-11 NOTE — Patient Instructions (Addendum)
[place] at [facility] Discharge Instructions  You were seen and examined today by [name].  [name] discussed your most recent lab work and CT scan which revealed that everything looks good. The cancer nodules in the omentum looks stable.  Continue taking Niraparab as prescribed.  Follow-up as scheduled in 2 months.    Thank you for choosing [place] at [facility] to provide your oncology and hematology care.  To afford each patient quality time with our provider, please arrive at least 15 minutes before your scheduled appointment time.   If you have a lab appointment with the [place] please come in thru the Main Entrance and check in at the main information desk.  You need to re-schedule your appointment should you arrive 10 or more minutes late.  We strive to give you quality time with our providers, and arriving late affects you and other patients whose appointments are after yours.  Also, if you no show three or more times for appointments you may be dismissed from the clinic at the providers discretion.     Again, thank you for choosing [facility].  Our hope is that these requests will decrease the amount of time that you wait before being seen by our physicians.       _____________________________________________________________  Should you have questions after your visit to [facility], please contact our office at [phone] and follow the prompts.  Our office hours are 8:00 a.m. and 4:30 p.m. Monday - Friday.  Please note that voicemails left after 4:00 p.m. may not be returned until the following business day.  We are closed weekends and major holidays.  You do have access to a nurse 24-7, just call the main number to the clinic [phone] and do not press any options, hold on the line and a nurse will answer the phone.    For prescription refill requests, have your pharmacy  contact our office and allow 72 hours.    Due to Covid, you will need to wear a mask upon entering the hospital. If you do not have a mask, a mask will be given to you at the Main Entrance upon arrival. For doctor visits, patients may have 1 support person age 72 or older with them. For treatment visits, patients can not have anyone with them due to social distancing guidelines and our immunocompromised population.

## 2022-06-11 NOTE — Progress Notes (Signed)
[address], [place] [address]   CLINIC:  Medical Oncology/Hematology  PCP:  [name], [address] / [place] [address] [phone]   REASON FOR VISIT:  Follow-up for high-grade serous ovarian carcinoma  PRIOR THERAPY: none  NGS Results: BRCA 1/2 negative  CURRENT THERAPY: Carboplatin, paclitaxel & Aloxi every 3 weeks  BRIEF ONCOLOGIC HISTORY:  Oncology History  Peritoneal carcinomatosis ([place])  06/06/2020 Initial Diagnosis   Peritoneal carcinomatosis ([place])   07/05/2020 -  Chemotherapy    Patient is on Treatment Plan: OVARIAN CARBOPLATIN (AUC 6) / PACLITAXEL (175) Q21D X 6 CYCLES      07/22/2020 Genetic Testing   Negative genetic testing: no pathogenic variants detected in [facility] Expanded CancerNext Panel + RNAInsight. The CancerNext-Expanded gene panel offered by [facility] and includes sequencing and rearrangement analysis for the following 77 genes: AIP, ALK, APC*, ATM*, AXIN2, BAP1, BARD1, BLM, BMPR1A, BRCA1*, BRCA2*, BRIP1*, CDC73, CDH1*, CDK4, CDKN1B, CDKN2A, CHEK2*, CTNNA1, DICER1, FANCC, FH, FLCN, GALNT12, KIF1B, LZTR1, MAX, MEN1, MET, MLH1*, MSH2*, MSH3, MSH6*, MUTYH*, NBN, NF1*, NF2, NTHL1, PALB2*, PHOX2B, PMS2*, POT1, PRKAR1A, PTCH1, PTEN*, RAD51C*, RAD51D*, RB1, RECQL, RET, SDHA, SDHAF2, SDHB, SDHC, SDHD, SMAD4, SMARCA4, SMARCB1, SMARCE1, STK11, SUFU, TMEM127, TP53*, TSC1, TSC2, VHL and XRCC2 (sequencing and deletion/duplication); EGFR, EGLN1, HOXB13, KIT, MITF, PDGFRA, POLD1, and POLE (sequencing only); EPCAM and GREM1 (deletion/duplication only). DNA and RNA analyses performed for * genes. The report date is July 22, 2020.    Ovarian cancer ([place])  07/07/2020 Initial Diagnosis   Ovarian cancer ([place])   05/04/2021 Imaging   1. No findings today to suggest new or progressive disease related to ovarian cancer. 2. Interval near complete resolution of the irregular and nodular areas of consolidative left upper  lobe opacity seen on the previous study. Residual architectural distortion in this region is compatible with scarring. Tiny nodules persist in the left upper lobe and attention on follow-up recommended. 3. Interval improvement in tree-in-bud nodularity anteromedial left upper lobe. Changes of bronchial wall thickening and mild cylindrical bronchiectasis in the lower lungs are similar to prior with persistent tree-in-bud nodularity posterior left lower lobe. Imaging features suggest sequelae of atypical infection. 4. Similar appearance of the omental soft tissue stranding with less prominent nodularity on today's study. 5. No ascites. 6. Mild circumferential wall thickening distal esophagus. Esophagitis could have this appearance. 7. Left colonic diverticulosis without diverticulitis. 8. Aortic Atherosclerosis (ICD10-I70.0) and Emphysema (ICD10-J43.9).   05/30/2021 -  Chemotherapy   She takes niraparib         CANCER STAGING: Cancer Staging  No matching staging information was found for the patient.  INTERVAL HISTORY:  Ms. TANAKA GILLEN, a 66 y.o. female, returns for routine follow-up of her high-grade serous ovarian carcinoma. Breonna was last seen on 04/08/2022.   Today she reports feeling well. She denies mouth sores, diarrhea, and skin rash. She reports easy bruising on her arms. Her fatigue is at baseline. She reports productive cough and SOB.   REVIEW OF SYSTEMS:  Review of Systems  Constitutional:  Positive for fatigue (stable). Negative for appetite change.  HENT:   Negative for mouth sores.   Respiratory:  Positive for cough and shortness of breath (3L).   Gastrointestinal:  Negative for diarrhea.  Skin:  Negative for rash.  Hematological:  Bruises/bleeds easily.  All other systems reviewed and are negative.   PAST MEDICAL/SURGICAL HISTORY:  Past Medical History:  Diagnosis Date   Anemia  Aortic atherosclerosis (HCC)    Arthritis    Asthma    Bilateral carotid  artery stenosis    Cancer (HCC)    Gastric Cancer   COPD (chronic obstructive pulmonary disease) (HCC)    High cholesterol    Hyperlipidemia    Hypertension    Neuropathy    Osteoarthritis    Ovarian cancer ([place])    Oxygen dependent    Peripheral vascular disease ([place])    Peritoneal carcinoma ([place])    Pneumonia 2019   Port-A-Cath in place 07/03/2020   Pulmonary emphysema ([place])    Past Surgical History:  Procedure Laterality Date   ESOPHAGOGASTRODUODENOSCOPY (EGD) WITH PROPOFOL N/A 02/17/2021   Procedure: ESOPHAGOGASTRODUODENOSCOPY (EGD) WITH PROPOFOL;  Surgeon: Harvel Quale, MD;  [facility];  Service: Gastroenterology;  Laterality: N/A;   ESOPHAGOGASTRODUODENOSCOPY (EGD) WITH PROPOFOL N/A 05/04/2022   Procedure: ESOPHAGOGASTRODUODENOSCOPY (EGD) WITH PROPOFOL;  Surgeon: Daneil Dolin, MD;  [facility];  Service: Endoscopy;  Laterality: N/A;   HALLUX VALGUS BASE WEDGE Right 06/09/2015   Procedure: Base wedge osteotomy with modified McBride right foot ;  Surgeon: Sharlotte Alamo, MD;  Location: [facility];  Service: Podiatry;  Laterality: Right;   PORTACATH PLACEMENT Left 06/28/2020   Procedure: PORT-A-CATHETER PLACEMENT LEFT CHEST (attached catheter in left subclavian);  Surgeon: Virl Cagey, MD;  Location: [facility];  Service: General;  Laterality: Left;   TUBAL LIGATION     VIDEO BRONCHOSCOPY WITH ENDOBRONCHIAL NAVIGATION N/A 03/09/2021   Procedure: VIDEO BRONCHOSCOPY WITH ENDOBRONCHIAL NAVIGATION;  Surgeon: Ottie Glazier, MD;  Location: [facility];  Service: Thoracic;  Laterality: N/A;   VIDEO BRONCHOSCOPY WITH ENDOBRONCHIAL ULTRASOUND N/A 03/09/2021   Procedure: VIDEO BRONCHOSCOPY WITH ENDOBRONCHIAL ULTRASOUND;  Surgeon: Ottie Glazier, MD;  Location: [facility];  Service: Thoracic;  Laterality: N/A;    SOCIAL HISTORY:  Social History   Socioeconomic History   Marital status: Divorced    Spouse name: Not on file   Number of children: 3   Years of  education: Not on file   Highest education level: Not on file  Occupational History   Occupation: DISABLED  Tobacco Use   Smoking status: Former    Packs/day: 2.00    Years: 30.00    Total pack years: 60.00    Types: Cigarettes    Quit date: 11/04/2013    Years since quitting: 8.6   Smokeless tobacco: Never  Vaping Use   Vaping Use: Never used  Substance and Sexual Activity   Alcohol use: No   Drug use: No   Sexual activity: Not Currently  Other Topics Concern   Not on file  Social History Narrative   Not on file   Social Determinants of Health   Financial Resource Strain: Low Risk  (09/27/2020)   Overall Financial Resource Strain (CARDIA)    Difficulty of Paying Living Expenses: Not hard at all  Food Insecurity: No Food Insecurity (09/27/2020)   Hunger Vital Sign    Worried About Running Out of Food in the Last Year: Never true    [place] in the Last Year: Never true  Transportation Needs: No Transportation Needs (09/27/2020)   PRAPARE - Hydrologist (Medical): No    Lack of Transportation (Non-Medical): No  Physical Activity: Inactive (09/27/2020)   Exercise Vital Sign    Days of Exercise per Week: 0 days    Minutes of Exercise per Session: 0 min  Stress: No Stress Concern Present (  09/27/2020)   [place]    Feeling of Stress : Not at all  Social Connections: Socially Isolated (09/27/2020)   Social Connection and Isolation Panel [NHANES]    Frequency of Communication with Friends and Family: More than three times a week    Frequency of Social Gatherings with Friends and Family: Never    Attends Religious Services: Never    Marine scientist or Organizations: No    Attends Archivist Meetings: Never    Marital Status: Divorced  Human resources officer Violence: Not At Risk (09/27/2020)   Humiliation, Afraid, Rape, and Kick questionnaire    Fear of  Current or Ex-Partner: No    Emotionally Abused: No    Physically Abused: No    Sexually Abused: No    FAMILY HISTORY:  Family History  Problem Relation Age of Onset   Alzheimer's disease Mother    COPD Father    Emphysema Father    Hypertension Father    Healthy Sister    Healthy Brother    Alzheimer's disease Maternal Grandmother    Healthy Sister    Healthy Sister    Healthy Sister    Prostate cancer Other        paternal grandmother's brother; dx in early 71s   Breast cancer Neg Hx     CURRENT MEDICATIONS:  Current Outpatient Medications  Medication Sig Dispense Refill   Acetylcysteine (MUCOMYST-10 IN) Inhale 1 vial into the lungs every three (3) days as needed (copd).     albuterol (VENTOLIN HFA) 108 (90 Base) MCG/ACT inhaler Inhale 2 puffs into the lungs every 6 (six) hours as needed for wheezing or shortness of breath.     amLODipine (NORVASC) 5 MG tablet Take 5 mg by mouth at bedtime.      aspirin EC 81 MG tablet Take 81 mg by mouth daily.     atorvastatin (LIPITOR) 40 MG tablet Take 1 tablet (40 mg total) by mouth daily. 30 tablet 1   ferrous sulfate 325 (65 FE) MG tablet Take 325 mg by mouth every other day.     Fluticasone-Umeclidin-Vilant (TRELEGY ELLIPTA) 100-62.5-25 MCG/ACT AEPB Inhale into the lungs.     ipratropium (ATROVENT HFA) 17 MCG/ACT inhaler Inhale 2 puffs into the lungs every 6 (six) hours as needed for wheezing. 1 each 12   levalbuterol (XOPENEX) 1.25 MG/3ML nebulizer solution Inhale into the lungs.     magnesium oxide (MAG-OX) 400 (240 Mg) MG tablet Take 1 tablet by mouth twice daily (Patient taking differently: Take 400 mg by mouth 2 (two) times daily.) 120 tablet 3   niraparib tosylate (ZEJULA) 100 MG capsule Take 2 capsules (200 mg total) by mouth daily. May take at bedtime to reduce nausea and vomiting. 60 capsule 11   nortriptyline (PAMELOR) 25 MG capsule Take 25 mg by mouth at bedtime.     OXYGEN Inhale 3-4 L into the lungs continuous.      pantoprazole (PROTONIX) 40 MG tablet Take 1 tablet (40 mg total) by mouth 2 (two) times daily. 180 tablet 0   predniSONE (DELTASONE) 5 MG tablet Take 5 mg by mouth daily with breakfast.     primidone (MYSOLINE) 50 MG tablet Take 50 mg by mouth at bedtime.     sucralfate (CARAFATE) 1 GM/10ML suspension Take 10 mLs (1 g total) by mouth 4 (four) times daily -  with meals and at bedtime for 14 days. 560 mL 0   No  current facility-administered medications for this visit.    ALLERGIES:  No Known Allergies  PHYSICAL EXAM:  Performance status (ECOG): 1 - Symptomatic but completely ambulatory  There were no vitals filed for this visit. Wt Readings from Last 3 Encounters:  05/03/22 125 lb 3.5 oz (56.8 kg)  04/08/22 123 lb 7.3 oz (56 kg)  03/10/22 125 lb (56.7 kg)   Physical Exam Vitals reviewed.  Constitutional:      Appearance: Normal appearance.     Interventions: Nasal cannula in place.     Comments: In wheelchair  Cardiovascular:     Rate and Rhythm: Normal rate and regular rhythm.     Pulses: Normal pulses.     Heart sounds: Normal heart sounds.  Pulmonary:     Effort: Pulmonary effort is normal.     Breath sounds: Normal breath sounds.     Comments: Hoarse breath sounds bilateral bases Neurological:     General: No focal deficit present.     Mental Status: She is alert and oriented to person, place, and time.  Psychiatric:        Mood and Affect: Mood normal.        Behavior: Behavior normal.      LABORATORY DATA:  I have reviewed the labs as listed.     Latest Ref Rng & Units 06/04/2022    2:17 PM 05/05/2022    5:36 AM 05/04/2022    3:25 AM  CBC  WBC 4.0 - 10.5 K/uL 4.8  4.9  6.6   Hemoglobin 12.0 - 15.0 g/dL 10.1  8.2  8.9   Hematocrit 36.0 - 46.0 % 31.6  25.7  28.7   Platelets 150 - 400 K/uL 145  198  194       Latest Ref Rng & Units 06/04/2022    2:17 PM 05/05/2022    5:36 AM 05/03/2022   12:51 PM  CMP  Glucose 70 - 99 mg/dL 102  99  115   BUN 8 - 23 mg/dL '9   15  17   ' Creatinine 0.44 - 1.00 mg/dL 0.73  0.73  0.71   Sodium 135 - 145 mmol/L 137  140  138   Potassium 3.5 - 5.1 mmol/L 4.4  3.1  3.7   Chloride 98 - 111 mmol/L 95  100  94   CO2 22 - 32 mmol/L 33  34  35   Calcium 8.9 - 10.3 mg/dL 9.0  8.5  8.9   Total Protein 6.5 - 8.1 g/dL 6.9   6.1   Total Bilirubin 0.3 - 1.2 mg/dL 0.6   0.2   Alkaline Phos 38 - 126 U/L 88   77   AST 15 - 41 U/L 28   22   ALT 0 - 44 U/L 16   11     DIAGNOSTIC IMAGING:  I have independently reviewed the scans and discussed with the patient. CT Abdomen Pelvis W Contrast  Result Date: 06/05/2022 CLINICAL DATA:  Restaging ovarian cancer. Initial diagnosis in 2021. EXAM: CT ABDOMEN AND PELVIS WITH CONTRAST TECHNIQUE: Multidetector CT imaging of the abdomen and pelvis was performed using the standard protocol following bolus administration of intravenous contrast. RADIATION DOSE REDUCTION: This exam was performed according to the departmental dose-optimization program which includes automated exposure control, adjustment of the mA and/or kV according to patient size and/or use of iterative reconstruction technique. CONTRAST:  176m OMNIPAQUE IOHEXOL 300 MG/ML  SOLN COMPARISON:  CT scan 03/10/2022 FINDINGS: Lower chest: Heart is normal  in size. No pericardial effusion. Stable small hiatal hernia. Stable left lower lobe bronchiectasis and scarring changes. No worrisome pulmonary lesions or pleural effusions. Hepatobiliary: Stable tiny peripheral low-attenuation lesion and segment 6. No worrisome hepatic lesions or intrahepatic biliary dilatation. The gallbladder is unremarkable. No common bile duct dilatation. Pancreas: No mass, inflammation or ductal dilatation. Spleen: With normal size.  Small calcified granulomas are stable. Adrenals/Urinary Tract: The adrenal glands are normal. No renal lesions or hydronephrosis.  The bladder is unremarkable. Stomach/Bowel: With the stomach, duodenum, small bowel and colon are unremarkable. No  acute inflammatory process, mass lesions or obstructive findings. The terminal ileum and appendix are normal. Stable colonic diverticulosis but no findings for acute diverticulitis. Vascular/Lymphatic: Stable significant age advanced atherosclerotic calcifications involving the aorta and iliac arteries and branch vessels but no aneurysm or dissection. The major venous structures are patent. No mesenteric or retroperitoneal mass or adenopathy. Stable interstitial nodularity type changes in the greater omentum but no new or progressive findings and no measurable nodules. No new peritoneal surface lesions or pelvic implants. Reproductive: Stable fibroid uterus with large dominant partially calcified fibroid. The ovaries are unremarkable. Other: No pelvic or inguinal lymphadenopathy. Musculoskeletal: No worrisome bone lesions. Stable degenerative changes at the pubic symphysis. IMPRESSION: 1. Stable interstitial nodularity type changes in the greater omentum but no new or progressive findings and no measurable nodules. 2. No new peritoneal implants or adenopathy. 3. Stable fibroid uterus. 4. Stable significant age advanced atherosclerotic calcifications involving the aorta and branch vessels. * Tracking Code: BO * Aortic Atherosclerosis (ICD10-I70.0). Electronically Signed   By: Marijo Sanes M.D.   On: 06/05/2022 19:00     ASSESSMENT:  1.  Peritoneal carcinomatosis: -Presentation to the ER with right lower quadrant abdominal pain on and off for 1 month. -CTAP on 05/29/2020 showed extensive nodularity throughout the omentum and upper peritoneal cavity.  Both ovaries are prominent although no well-defined mass is noted.  Small volume ascites. -CA-125 is elevated at 2407.  CEA was 2.4. -CT of the chest shows 11 mm right paratracheal lymph node and 11 mm short axis precarinal lymph node.  Subcarinal lymphadenopathy measuring 14 mm short axis.  This is suspicious for metastatic disease. -Needle biopsy of the omentum  on 06/13/2020 shows high-grade adenocarcinoma, morphology and IHC consistent with high-grade gynecological adenocarcinoma including high-grade ovarian serous carcinoma. -Cycle 1 of carboplatin and paclitaxel on 07/05/2020. -Evaluated by Dr. Denman George on 07/07/2020.  Reevaluation after 3 cycles. -CT CAP from 09/01/2020 showed mild improvement with reduction in the omental caking.  Stable to minimally reduced adenopathy in the chest and abdomen.  New 4 mm right upper lobe lung nodule could be inflammatory. -CT CAP on 11/07/2019 and after 6 cycles showed improved peritoneal disease/omental caking.  9 mm short axis portacaval lymph node and adjacent upper abdominal lymph nodes measuring 12 mm in short axis.  Small retroperitoneal lymph nodes up to 5 mm improved.  Peritoneal disease/omental caking now measures 2.4 cm in thickness. -CA-125 on 10/18/2020 improved 52. -Plan for surgical resection after 6 cycles.  Unfortunately she was not cleared for surgery by anesthesia/pulmonary. - CT CAP on 02/12/2021 showed peritoneal disease and abdominal lymph nodes have improved.  However new masslike areas in the left upper lobe, new since January 31 CT scan along with borderline enlargement of the left supraclavicular lymph node. - She underwent bronchoscopy and biopsy on 03/09/2021 which was benign. - Niraparib 200 mg daily started around 05/25/2021.   2.  COPD: -Quit smoking 6 years  ago.  Smoked 2 packs/day for 25-30 years. -Has been on 3 L/min oxygen via nasal cannula for the last 5 years.   3.  Family history: -Paternal grandmother with colon cancer.   PLAN:  1.  High-grade serous ovarian carcinoma: - She is tolerating niraparib 200 mg daily very well. - She is having some respiratory issues and is being followed by her pulmonologist  Dr. Raul Del in [place]. - I have reviewed her labs which showed creatinine 0.73 and normal LFTs.  CBC was grossly normal.  Hemoglobin improved to 10.1.  CA125 was 35. - CTAP  (06/04/2022): Stable nodularity in the greater omentum with no progressive findings.  No new peritoneal implants or adenopathy. - Recommend continuing niraparib at the same dose 200 mg daily. - RTC 2 months with repeat tumor marker and labs.  We will also repeat ferritin and iron panel.    2.  Family history: - Germline mutation testing was negative.   3.  Peripheral neuropathy: - Continue primidone.  Gabapentin discontinued.   4.  Severe COPD: - Continue prednisone, Trelegy and Xopenex.   5.  Hypomagnesemia: - Continue magnesium twice daily.  Magnesium is normal.   Orders placed this encounter:  No orders of the defined types were placed in this encounter.    Derek Jack, MD [place] [phone]   I, Thana Ates, am acting as a scribe for Dr. Derek Jack.  I, Derek Jack MD, have reviewed the above documentation for accuracy and completeness, and I agree with the above.

## 2022-06-12 ENCOUNTER — Other Ambulatory Visit: Payer: Self-pay

## 2022-06-14 ENCOUNTER — Other Ambulatory Visit (HOSPITAL_COMMUNITY): Payer: Self-pay

## 2022-06-18 ENCOUNTER — Other Ambulatory Visit: Payer: Self-pay

## 2022-06-20 ENCOUNTER — Other Ambulatory Visit (HOSPITAL_COMMUNITY): Payer: Self-pay

## 2022-06-24 ENCOUNTER — Other Ambulatory Visit (HOSPITAL_COMMUNITY): Payer: Self-pay

## 2022-06-25 ENCOUNTER — Other Ambulatory Visit (HOSPITAL_COMMUNITY): Payer: Self-pay | Admitting: Pharmacist

## 2022-06-25 ENCOUNTER — Other Ambulatory Visit (HOSPITAL_COMMUNITY): Payer: Self-pay

## 2022-06-25 DIAGNOSIS — C569 Malignant neoplasm of unspecified ovary: Secondary | ICD-10-CM

## 2022-06-25 MED ORDER — ZEJULA 200 MG PO TABS
1.0000 | ORAL_TABLET | Freq: Every day | ORAL | 9 refills | Status: DC
  Filled 2022-06-25: qty 30, fill #0
  Filled 2022-07-03: qty 30, 30d supply, fill #0
  Filled 2022-08-06: qty 30, 30d supply, fill #1
  Filled 2022-09-12: qty 30, 30d supply, fill #2

## 2022-06-27 ENCOUNTER — Other Ambulatory Visit
Admission: RE | Admit: 2022-06-27 | Discharge: 2022-06-27 | Disposition: A | Discharge status: Home / Self Care | Payer: Medicare HMO | Source: Ambulatory Visit | Attending: Specialist | Admitting: Specialist

## 2022-06-27 DIAGNOSIS — R0602 Shortness of breath: Secondary | ICD-10-CM | POA: Insufficient documentation

## 2022-06-27 LAB — D-DIMER, QUANTITATIVE: D-Dimer, Quant: 1.81 ug/mL-FEU — ABNORMAL HIGH (ref 0.00–0.50)

## 2022-06-28 ENCOUNTER — Encounter (HOSPITAL_COMMUNITY): Payer: Self-pay | Admitting: Emergency Medicine

## 2022-06-28 ENCOUNTER — Other Ambulatory Visit: Payer: Self-pay | Admitting: Specialist

## 2022-06-28 ENCOUNTER — Other Ambulatory Visit (HOSPITAL_COMMUNITY): Payer: Self-pay

## 2022-06-28 ENCOUNTER — Other Ambulatory Visit (HOSPITAL_COMMUNITY): Payer: Self-pay | Admitting: Specialist

## 2022-06-28 ENCOUNTER — Other Ambulatory Visit: Payer: Self-pay

## 2022-06-28 ENCOUNTER — Emergency Department (HOSPITAL_COMMUNITY): Payer: Medicare HMO

## 2022-06-28 ENCOUNTER — Observation Stay (HOSPITAL_COMMUNITY)
Admission: EM | Admit: 2022-06-28 | Discharge: 2022-07-01 | Disposition: A | Discharge status: Home / Self Care | Payer: Medicare HMO | Attending: Family Medicine | Admitting: Family Medicine

## 2022-06-28 DIAGNOSIS — J9611 Chronic respiratory failure with hypoxia: Secondary | ICD-10-CM | POA: Diagnosis not present

## 2022-06-28 DIAGNOSIS — J45909 Unspecified asthma, uncomplicated: Secondary | ICD-10-CM | POA: Diagnosis not present

## 2022-06-28 DIAGNOSIS — K2091 Esophagitis, unspecified with bleeding: Principal | ICD-10-CM | POA: Insufficient documentation

## 2022-06-28 DIAGNOSIS — Z87891 Personal history of nicotine dependence: Secondary | ICD-10-CM | POA: Diagnosis not present

## 2022-06-28 DIAGNOSIS — Z8543 Personal history of malignant neoplasm of ovary: Secondary | ICD-10-CM | POA: Diagnosis not present

## 2022-06-28 DIAGNOSIS — K449 Diaphragmatic hernia without obstruction or gangrene: Secondary | ICD-10-CM | POA: Insufficient documentation

## 2022-06-28 DIAGNOSIS — J441 Chronic obstructive pulmonary disease with (acute) exacerbation: Secondary | ICD-10-CM | POA: Diagnosis not present

## 2022-06-28 DIAGNOSIS — K209 Esophagitis, unspecified without bleeding: Secondary | ICD-10-CM | POA: Diagnosis present

## 2022-06-28 DIAGNOSIS — Z20822 Contact with and (suspected) exposure to covid-19: Secondary | ICD-10-CM | POA: Diagnosis not present

## 2022-06-28 DIAGNOSIS — R0602 Shortness of breath: Secondary | ICD-10-CM

## 2022-06-28 DIAGNOSIS — R933 Abnormal findings on diagnostic imaging of other parts of digestive tract: Secondary | ICD-10-CM | POA: Diagnosis not present

## 2022-06-28 DIAGNOSIS — K920 Hematemesis: Secondary | ICD-10-CM | POA: Diagnosis not present

## 2022-06-28 DIAGNOSIS — D62 Acute posthemorrhagic anemia: Secondary | ICD-10-CM | POA: Diagnosis not present

## 2022-06-28 DIAGNOSIS — I509 Heart failure, unspecified: Secondary | ICD-10-CM | POA: Diagnosis not present

## 2022-06-28 DIAGNOSIS — I10 Essential (primary) hypertension: Secondary | ICD-10-CM | POA: Diagnosis not present

## 2022-06-28 DIAGNOSIS — J449 Chronic obstructive pulmonary disease, unspecified: Secondary | ICD-10-CM | POA: Diagnosis present

## 2022-06-28 DIAGNOSIS — Z7982 Long term (current) use of aspirin: Secondary | ICD-10-CM | POA: Insufficient documentation

## 2022-06-28 DIAGNOSIS — C786 Secondary malignant neoplasm of retroperitoneum and peritoneum: Secondary | ICD-10-CM

## 2022-06-28 DIAGNOSIS — R7989 Other specified abnormal findings of blood chemistry: Secondary | ICD-10-CM

## 2022-06-28 DIAGNOSIS — K2971 Gastritis, unspecified, with bleeding: Secondary | ICD-10-CM | POA: Insufficient documentation

## 2022-06-28 DIAGNOSIS — Z85028 Personal history of other malignant neoplasm of stomach: Secondary | ICD-10-CM | POA: Insufficient documentation

## 2022-06-28 DIAGNOSIS — I110 Hypertensive heart disease with heart failure: Secondary | ICD-10-CM | POA: Insufficient documentation

## 2022-06-28 DIAGNOSIS — Z9981 Dependence on supplemental oxygen: Secondary | ICD-10-CM | POA: Insufficient documentation

## 2022-06-28 DIAGNOSIS — Z79899 Other long term (current) drug therapy: Secondary | ICD-10-CM | POA: Insufficient documentation

## 2022-06-28 DIAGNOSIS — C569 Malignant neoplasm of unspecified ovary: Secondary | ICD-10-CM | POA: Diagnosis present

## 2022-06-28 DIAGNOSIS — K222 Esophageal obstruction: Secondary | ICD-10-CM | POA: Diagnosis not present

## 2022-06-28 DIAGNOSIS — K922 Gastrointestinal hemorrhage, unspecified: Secondary | ICD-10-CM | POA: Diagnosis present

## 2022-06-28 LAB — BASIC METABOLIC PANEL
Anion gap: 9 (ref 5–15)
BUN: 29 mg/dL — ABNORMAL HIGH (ref 8–23)
CO2: 36 mmol/L — ABNORMAL HIGH (ref 22–32)
Calcium: 8.6 mg/dL — ABNORMAL LOW (ref 8.9–10.3)
Chloride: 91 mmol/L — ABNORMAL LOW (ref 98–111)
Creatinine, Ser: 0.69 mg/dL (ref 0.44–1.00)
GFR, Estimated: >60 mL/min (ref >60)
Glucose, Bld: 117 mg/dL — ABNORMAL HIGH (ref 70–99)
Potassium: 3.4 mmol/L — ABNORMAL LOW (ref 3.5–5.1)
Sodium: 136 mmol/L (ref 135–145)

## 2022-06-28 LAB — CBC WITH DIFFERENTIAL/PLATELET
Abs Immature Granulocytes: 0.04 10*3/uL (ref 0.00–0.07)
Basophils Absolute: 0.1 10*3/uL (ref 0.0–0.1)
Basophils Relative: 1 %
Eosinophils Absolute: 0.1 10*3/uL (ref 0.0–0.5)
Eosinophils Relative: 1 %
HCT: 28 % — ABNORMAL LOW (ref 36.0–46.0)
Hemoglobin: 9 g/dL — ABNORMAL LOW (ref 12.0–15.0)
Immature Granulocytes: 1 %
Lymphocytes Relative: 13 %
Lymphs Abs: 1.2 10*3/uL (ref 0.7–4.0)
MCH: 31.6 pg (ref 26.0–34.0)
MCHC: 32.1 g/dL (ref 30.0–36.0)
MCV: 98.2 fL (ref 80.0–100.0)
Monocytes Absolute: 1.1 10*3/uL — ABNORMAL HIGH (ref 0.1–1.0)
Monocytes Relative: 12 %
Neutro Abs: 6.4 10*3/uL (ref 1.7–7.7)
Neutrophils Relative %: 72 %
Platelets: 123 10*3/uL — ABNORMAL LOW (ref 150–400)
RBC: 2.85 MIL/uL — ABNORMAL LOW (ref 3.87–5.11)
RDW: 16.4 % — ABNORMAL HIGH (ref 11.5–15.5)
WBC: 8.8 10*3/uL (ref 4.0–10.5)
nRBC: 0 % (ref 0.0–0.2)

## 2022-06-28 LAB — PROTIME-INR
INR: 0.9 (ref 0.8–1.2)
Prothrombin Time: 12.1 seconds (ref 11.4–15.2)

## 2022-06-28 LAB — TROPONIN I (HIGH SENSITIVITY)
Troponin I (High Sensitivity): 7 ng/L (ref <18)
Troponin I (High Sensitivity): 6 ng/L (ref <18)

## 2022-06-28 LAB — BRAIN NATRIURETIC PEPTIDE: B Natriuretic Peptide: 27 pg/mL (ref 0.0–100.0)

## 2022-06-28 LAB — SARS CORONAVIRUS 2 BY RT PCR: SARS Coronavirus 2 by RT PCR: NEGATIVE

## 2022-06-28 MED ORDER — PANTOPRAZOLE SODIUM 40 MG IV SOLR
40.0000 mg | Freq: Once | INTRAVENOUS | Status: AC
Start: 2022-06-28 — End: 2022-06-28
  Administered 2022-06-28: 40 mg via INTRAVENOUS
  Filled 2022-06-28: qty 10

## 2022-06-28 MED ORDER — IOHEXOL 350 MG/ML SOLN
100.0000 mL | Freq: Once | INTRAVENOUS | Status: AC | PRN
  Administered 2022-06-28: 75 mL via INTRAVENOUS

## 2022-06-28 MED ORDER — PREDNISONE 10 MG PO TABS
5.0000 mg | ORAL_TABLET | Freq: Every day | ORAL | Status: DC
  Administered 2022-06-29 – 2022-07-01 (×3): 5 mg via ORAL
  Filled 2022-06-28 (×3): qty 1

## 2022-06-28 MED ORDER — UMECLIDINIUM BROMIDE 62.5 MCG/ACT IN AEPB
1.0000 | INHALATION_SPRAY | Freq: Every day | RESPIRATORY_TRACT | Status: DC
  Administered 2022-06-29 – 2022-07-01 (×3): 1 via RESPIRATORY_TRACT
  Filled 2022-06-28: qty 7

## 2022-06-28 MED ORDER — ALBUTEROL SULFATE HFA 108 (90 BASE) MCG/ACT IN AERS: 2.0000 | INHALATION_SPRAY | Freq: Four times a day (QID) | RESPIRATORY_TRACT | Status: DC | PRN

## 2022-06-28 MED ORDER — HEPARIN SOD (PORK) LOCK FLUSH 100 UNIT/ML IV SOLN
INTRAVENOUS | Status: AC
  Filled 2022-06-28: qty 5

## 2022-06-28 MED ORDER — PANTOPRAZOLE SODIUM 40 MG IV SOLR
40.0000 mg | Freq: Two times a day (BID) | INTRAVENOUS | Status: DC
  Administered 2022-06-28 – 2022-07-01 (×6): 40 mg via INTRAVENOUS
  Filled 2022-06-28 (×6): qty 10

## 2022-06-28 MED ORDER — MAGNESIUM OXIDE -MG SUPPLEMENT 400 (240 MG) MG PO TABS
400.0000 mg | ORAL_TABLET | Freq: Two times a day (BID) | ORAL | Status: DC
  Administered 2022-06-28 – 2022-07-01 (×6): 400 mg via ORAL
  Filled 2022-06-28 (×6): qty 1

## 2022-06-28 MED ORDER — HYDROCODONE-ACETAMINOPHEN 5-325 MG PO TABS
1.0000 | ORAL_TABLET | Freq: Four times a day (QID) | ORAL | Status: DC | PRN
  Administered 2022-06-29: 1 via ORAL
  Filled 2022-06-28: qty 1

## 2022-06-28 MED ORDER — AMLODIPINE BESYLATE 5 MG PO TABS
5.0000 mg | ORAL_TABLET | Freq: Every day | ORAL | Status: DC
Start: 2022-06-28 — End: 2022-07-01
  Administered 2022-06-28 – 2022-06-30 (×3): 5 mg via ORAL
  Filled 2022-06-28 (×3): qty 1

## 2022-06-28 MED ORDER — ASPIRIN 81 MG PO TBEC
81.0000 mg | DELAYED_RELEASE_TABLET | Freq: Every day | ORAL | Status: DC
  Administered 2022-06-29 – 2022-07-01 (×3): 81 mg via ORAL
  Filled 2022-06-28 (×3): qty 1

## 2022-06-28 MED ORDER — SODIUM CHLORIDE 0.9 % IV SOLN
12.5000 mg | Freq: Once | INTRAVENOUS | Status: AC
  Administered 2022-06-28: 12.5 mg via INTRAVENOUS
  Filled 2022-06-28: qty 0.5

## 2022-06-28 MED ORDER — MORPHINE SULFATE (PF) 4 MG/ML IV SOLN
4.0000 mg | Freq: Once | INTRAVENOUS | Status: AC
  Administered 2022-06-28: 4 mg via INTRAVENOUS
  Filled 2022-06-28: qty 1

## 2022-06-28 MED ORDER — SODIUM CHLORIDE 0.9 % IV SOLN
1.5000 g | Freq: Four times a day (QID) | INTRAVENOUS | Status: DC
Start: 2022-06-28 — End: 2022-06-28
  Administered 2022-06-28: 1.5 g via INTRAVENOUS
  Filled 2022-06-28: qty 4

## 2022-06-28 MED ORDER — ONDANSETRON HCL 4 MG/2ML IJ SOLN
4.0000 mg | Freq: Four times a day (QID) | INTRAMUSCULAR | Status: DC | PRN
  Administered 2022-06-28: 4 mg via INTRAVENOUS
  Filled 2022-06-28: qty 2

## 2022-06-28 MED ORDER — ATORVASTATIN CALCIUM 40 MG PO TABS
40.0000 mg | ORAL_TABLET | Freq: Every day | ORAL | Status: DC
  Administered 2022-06-28 – 2022-06-30 (×3): 40 mg via ORAL
  Filled 2022-06-28 (×3): qty 1

## 2022-06-28 MED ORDER — MAGNESIUM SULFATE 2 GM/50ML IV SOLN
2.0000 g | Freq: Once | INTRAVENOUS | Status: AC
  Administered 2022-06-28: 2 g via INTRAVENOUS
  Filled 2022-06-28: qty 50

## 2022-06-28 MED ORDER — NORTRIPTYLINE HCL 25 MG PO CAPS
25.0000 mg | ORAL_CAPSULE | Freq: Every day | ORAL | Status: DC
  Administered 2022-06-29 – 2022-06-30 (×2): 25 mg via ORAL
  Filled 2022-06-28 (×4): qty 1

## 2022-06-28 MED ORDER — LACTATED RINGERS IV SOLN: INTRAVENOUS | Status: DC

## 2022-06-28 MED ORDER — IPRATROPIUM BROMIDE HFA 17 MCG/ACT IN AERS: 2.0000 | INHALATION_SPRAY | Freq: Four times a day (QID) | RESPIRATORY_TRACT | Status: DC | PRN

## 2022-06-28 MED ORDER — PRIMIDONE 50 MG PO TABS
50.0000 mg | ORAL_TABLET | Freq: Every day | ORAL | Status: DC
  Administered 2022-06-28 – 2022-06-30 (×3): 50 mg via ORAL
  Filled 2022-06-28 (×3): qty 1

## 2022-06-28 MED ORDER — LEVOFLOXACIN 500 MG PO TABS
500.0000 mg | ORAL_TABLET | ORAL | Status: DC
  Administered 2022-06-28 – 2022-06-29 (×2): 500 mg via ORAL
  Filled 2022-06-28 (×3): qty 1

## 2022-06-28 MED ORDER — ALBUTEROL SULFATE (2.5 MG/3ML) 0.083% IN NEBU
2.5000 mg | INHALATION_SOLUTION | Freq: Four times a day (QID) | RESPIRATORY_TRACT | Status: DC | PRN
Start: 2022-06-28 — End: 2022-07-01

## 2022-06-28 MED ORDER — ONDANSETRON HCL 4 MG PO TABS: 4.0000 mg | ORAL_TABLET | Freq: Four times a day (QID) | ORAL | Status: DC | PRN

## 2022-06-28 MED ORDER — IPRATROPIUM-ALBUTEROL 0.5-2.5 (3) MG/3ML IN SOLN
3.0000 mL | Freq: Four times a day (QID) | RESPIRATORY_TRACT | Status: DC | PRN
Start: 2022-06-28 — End: 2022-07-01

## 2022-06-28 MED ORDER — ONDANSETRON HCL 4 MG/2ML IJ SOLN
4.0000 mg | Freq: Once | INTRAMUSCULAR | Status: AC
  Administered 2022-06-28: 4 mg via INTRAVENOUS
  Filled 2022-06-28: qty 2

## 2022-06-28 MED ORDER — SUCRALFATE 1 GM/10ML PO SUSP
1.0000 g | Freq: Three times a day (TID) | ORAL | Status: DC
  Administered 2022-06-28 – 2022-07-01 (×10): 1 g via ORAL
  Filled 2022-06-28 (×11): qty 10

## 2022-06-28 MED ORDER — FLUTICASONE FUROATE-VILANTEROL 100-25 MCG/ACT IN AEPB
1.0000 | INHALATION_SPRAY | Freq: Every day | RESPIRATORY_TRACT | Status: DC
  Administered 2022-06-29 – 2022-07-01 (×3): 1 via RESPIRATORY_TRACT
  Filled 2022-06-28: qty 28

## 2022-06-28 MED ORDER — NIRAPARIB TOSYLATE 200 MG PO TABS: 1.0000 | ORAL_TABLET | Freq: Every day | ORAL | Status: DC

## 2022-06-28 NOTE — ED Provider Notes (Signed)
[facility] EMERGENCY DEPARTMENT Provider Note   CSN: [id number] Arrival date & time: 06/28/22  1049     History  Chief Complaint  Patient presents with   Shortness of Breath    [name] is a 66 y.o. female.  She has a history of ovarian cancer, COPD on home oxygen, CHF.  She has had increased shortness of breath and a cough productive of some clear sputum for a week.  Also having pain in her right scapula.  Just finished a course of antibiotics and steroids.  Saw pulmonology yesterday who started her on Levaquin and Percocet.  Had a tough night last night with her pain.  Was called today that her D-dimer was elevated and needed to scan.  Denies any hemoptysis.  No fevers or chills.  Former smoker  The history is provided by the patient and the spouse.  Shortness of Breath Severity:  Severe Onset quality:  Gradual Duration:  1 week Timing:  Constant Progression:  Unchanged Chronicity:  Recurrent Relieved by:  Nothing Worsened by:  Activity Ineffective treatments:  Oxygen Associated symptoms: cough and sputum production   Associated symptoms: no abdominal pain, no chest pain, no fever, no headaches, no hemoptysis, no rash and no vomiting   Risk factors: no tobacco use        Home Medications Prior to Admission medications   Medication Sig Start Date End Date Taking? Authorizing Provider  Acetylcysteine (MUCOMYST-10 IN) Inhale 1 vial into the lungs every three (3) days as needed (copd).    [provider]  albuterol (VENTOLIN HFA) 108 (90 Base) MCG/ACT inhaler Inhale 2 puffs into the lungs every 6 (six) hours as needed for wheezing or shortness of breath.    [provider]  amLODipine (NORVASC) 5 MG tablet Take 5 mg by mouth at bedtime.     [provider]  aspirin EC 81 MG tablet Take 81 mg by mouth daily.    [provider]  atorvastatin (LIPITOR) 40 MG tablet Take 1 tablet (40 mg total) by mouth daily. 01/02/22   Dessa Phi, DO   ferrous sulfate 325 (65 FE) MG tablet Take 325 mg by mouth every other day.    [provider]  Fluticasone-Umeclidin-Vilant (TRELEGY ELLIPTA) 100-62.5-25 MCG/ACT AEPB Inhale into the lungs. 06/18/21   [provider]  ipratropium (ATROVENT HFA) 17 MCG/ACT inhaler Inhale 2 puffs into the lungs every 6 (six) hours as needed for wheezing. 03/13/22   Johnson, Clanford L, MD  levalbuterol (XOPENEX) 1.25 MG/3ML nebulizer solution Inhale into the lungs. 09/28/21   [provider]  magnesium oxide (MAG-OX) 400 (240 Mg) MG tablet Take 1 tablet by mouth twice daily Patient taking differently: Take 400 mg by mouth 2 (two) times daily. 03/04/22   Derek Jack, MD  Niraparib Tosylate (ZEJULA) 200 MG TABS Take 1 tablet by mouth daily. 06/25/22   Derek Jack, MD  nortriptyline (PAMELOR) 25 MG capsule Take 25 mg by mouth at bedtime.    [provider]  OXYGEN Inhale 3-4 L into the lungs continuous.    [provider]  pantoprazole (PROTONIX) 40 MG tablet Take 1 tablet (40 mg total) by mouth 2 (two) times daily. 05/05/22 08/03/22  Manuella Ghazi, Pratik D, DO  predniSONE (DELTASONE) 5 MG tablet Take 5 mg by mouth daily with breakfast.    [provider]  primidone (MYSOLINE) 50 MG tablet Take 50 mg by mouth at bedtime. 01/30/22   [provider]  sucralfate (CARAFATE) 1  GM/10ML suspension Take 10 mLs (1 g total) by mouth 4 (four) times daily -  with meals and at bedtime for 14 days. 05/05/22 05/19/22  Heath Lark D, DO      Allergies    Patient has no known allergies.    Review of Systems   Review of Systems  Constitutional:  Negative for fever.  Respiratory:  Positive for cough, sputum production and shortness of breath. Negative for hemoptysis.   Cardiovascular:  Negative for chest pain and leg swelling.  Gastrointestinal:  Positive for nausea. Negative for abdominal pain and vomiting.  Genitourinary:  Negative for dysuria.  Musculoskeletal:   Positive for back pain.  Skin:  Negative for rash.  Neurological:  Negative for headaches.    Physical Exam Updated Vital Signs BP 129/81   Pulse 98   Temp 97.8 F (36.6 C) (Oral)   Resp 19   Ht 5' (1.524 m)   Wt 56.2 kg   SpO2 99%   BMI 24.22 kg/m  Physical Exam Vitals and nursing note reviewed.  Constitutional:      General: She is not in acute distress.    Appearance: She is well-developed.  HENT:     Head: Normocephalic and atraumatic.  Eyes:     Conjunctiva/sclera: Conjunctivae normal.  Cardiovascular:     Rate and Rhythm: Normal rate and regular rhythm.     Heart sounds: No murmur heard. Pulmonary:     Effort: Pulmonary effort is normal. No respiratory distress.     Breath sounds: Examination of the left-upper field reveals rhonchi. Rhonchi present.  Abdominal:     Palpations: Abdomen is soft.     Tenderness: There is no abdominal tenderness.  Musculoskeletal:        General: No swelling. Normal range of motion.     Cervical back: Neck supple.     Right lower leg: No tenderness. No edema.     Left lower leg: No tenderness. No edema.  Skin:    General: Skin is warm and dry.     Capillary Refill: Capillary refill takes less than 2 seconds.  Neurological:     General: No focal deficit present.     Mental Status: She is alert. Mental status is at baseline.     ED Results / Procedures / Treatments   Labs (all labs ordered are listed, but only abnormal results are displayed) Labs Reviewed  BASIC METABOLIC PANEL - Abnormal; Notable for the following components:      Result Value   Potassium 3.4 (*)    Chloride 91 (*)    CO2 36 (*)    Glucose, Bld 117 (*)    BUN 29 (*)    Calcium 8.6 (*)    All other components within normal limits  CBC WITH DIFFERENTIAL/PLATELET - Abnormal; Notable for the following components:   RBC 2.85 (*)    Hemoglobin 9.0 (*)    HCT 28.0 (*)    RDW 16.4 (*)    Platelets 123 (*)    Monocytes Absolute 1.1 (*)    All other  components within normal limits  SARS CORONAVIRUS 2 BY RT PCR  BRAIN NATRIURETIC PEPTIDE  PROTIME-INR  BASIC METABOLIC PANEL  CBC  TYPE AND SCREEN  TROPONIN I (HIGH SENSITIVITY)  TROPONIN I (HIGH SENSITIVITY)    EKG None  Radiology CT Angio Chest PE W/Cm &/Or Wo Cm  Result Date: 06/28/2022 CLINICAL DATA:  Shortness of breath EXAM: CT ANGIOGRAPHY CHEST WITH CONTRAST TECHNIQUE: Multidetector CT imaging  of the chest was performed using the standard protocol during bolus administration of intravenous contrast. Multiplanar CT image reconstructions and MIPs were obtained to evaluate the vascular anatomy. RADIATION DOSE REDUCTION: This exam was performed according to the departmental dose-optimization program which includes automated exposure control, adjustment of the mA and/or kV according to patient size and/or use of iterative reconstruction technique. CONTRAST:  53m OMNIPAQUE IOHEXOL 350 MG/ML SOLN COMPARISON:  05/03/2022 FINDINGS: Cardiovascular: Satisfactory opacification of the pulmonary arteries to the segmental level. No evidence of pulmonary embolism. Normal heart size. No pericardial effusion. Moderate coronary artery calcifications. Aortic atherosclerosis. Mediastinum/Nodes: Redemonstrated moderate to severe circumferential wall thickening in the lower esophagus, similar to the prior exam. New moderate wall thickening in the upper and mid esophagus. Fluid in the lower esophagus, which increases the risk of aspiration. No enlarged mediastinal, hilar, or axillary lymph nodes. The trachea is patent. Lungs/Pleura: Redemonstrated bronchiectasis of the left lower lobe and lingula with clustered centrilobular nodules, slightly improved in the lingula, compared to the prior exam. Unchanged linear opacities in the bilateral upper lobes, likely scarring. No pleural effusion or pneumothorax. Apical predominant centrilobular and paraseptal emphysema. Upper Abdomen: No acute abnormality. Musculoskeletal:  S shaped curvature of the thoracolumbar spine. No acute osseous abnormality. No chest wall abnormality. Review of the MIP images confirms the above findings. IMPRESSION: 1. Negative for pulmonary embolism. 2. Moderate circumferential wall thickening in the proximal esophagus, which is new compared to 05/03/2022, with redemonstrated moderate to severe thickening of the distal esophagus, likely representing esophagitis. Endoscopy is again recommended for further evaluation. In addition, there is fluid in the lower esophagus, which increases the risk of aspiration. 3. Redemonstrated bronchiectasis in the lingula and left lower lobe with clustered centrilobular nodules, which have improved slightly in the lingula but remain concerning for chronic atypical infection or recurrent aspiration. 4. Aortic Atherosclerosis (ICD10-I70.0) and Emphysema (ICD10-J43.9). Electronically Signed   By: AMerilyn BabaM.D.   On: 06/28/2022 14:15   DG Chest Portable 1 View  Result Date: 06/28/2022 CLINICAL DATA:  Chest pain for 3 weeks. History of COPD. Patient on home oxygen. EXAM: PORTABLE CHEST 1 VIEW COMPARISON:  Chest radiograph, 05/03/2022 and prior studies. Chest CT, 05/03/2022. FINDINGS: Cardiac silhouette normal in size. No mediastinal or hilar masses. Stable left anterior chest wall Port-A-Cath. Stable areas of lung scarring and interstitial thickening. No lung consolidation. No evidence of edema. Relative volume loss on the left. No pleural effusion or pneumothorax. Skeletal structures are grossly intact. IMPRESSION: 1. No acute cardiopulmonary disease. 2. Chronic lung changes stable from the prior study. Electronically Signed   By: DLajean ManesM.D.   On: 06/28/2022 11:44    Procedures Procedures    Medications Ordered in ED Medications  morphine (PF) 4 MG/ML injection 4 mg (has no administration in time range)  magnesium sulfate IVPB 2 g 50 mL (has no administration in time range)    ED Course/ Medical Decision  Making/ A&P Clinical Course as of 06/28/22 1858  Fri Jun 28, 2022  1216 EKG not crossing in epic, sinus tachycardia, nonspecific ST-T changes.  No significant change from 4/23 [MB]  1221 Informed by nurse that patient vomited once question coffee-ground.  Protonix and Zofran ordered. [MB]  1430 Patient had endoscopy in June showing severe esophagitis was put on Protonix and Carafate. [MB]  19702Patient remains nauseous unwilling to try to take p.o. [MB]  1600 Discussed with Dr. SNehemiah [facility]le[facility] hospitalist who will evaluate patient for admission. [MB]  Clinical Course User Index [MB] Hayden Rasmussen, MD                           Medical Decision Making Amount and/or Complexity of Data Reviewed Labs: ordered. Radiology: ordered.  Risk Prescription drug management. Decision regarding hospitalization.  MATISON NUCCIO was evaluated in Emergency Department on 06/28/2022 for the symptoms described in the history of present illness. She was evaluated in the context of the global COVID-19 pandemic, which necessitated consideration that the patient might be at risk for infection with the SARS-CoV-2 virus that causes COVID-19. Institutional protocols and algorithms that pertain to the evaluation of patients at risk for COVID-19 are in a state of rapid change based on information released by regulatory bodies including the CDC and federal and state organizations. These policies and algorithms were followed during the patient's care in the ED.  This patient complains of shortness of breath, right upper back pain, cough, vomiting; this involves an extensive number of treatment Options and is a complaint that carries with it a high risk of complications and morbidity. The differential includes musculoskeletal pain, PE, pneumothorax, pneumonia, COPD, CHF, ACS  I ordered, reviewed and interpreted labs, which included CBC normal, hemoglobin low stable from priors, chemistries fairly unremarkable from  baseline.  Troponins flat BNP negative COVID-negative. I ordered medication IV Zofran and IV Phenergan and IV magnesium, IV Protonix.  And reviewed PMP when indicated. I ordered imaging studies which included chest x-ray and CT angio chest and I independently    visualized and interpreted imaging which showed no acute PE.  Does have esophageal thickening and possible signs of inflammatory changes in the lung  Additional history obtained from patient's spouse Previous records obtained and reviewed in epic including prior discharge summaries I consulted Dr. Nehemiah Settle [facility] hospitalist and discussed lab and imaging findings and discussed disposition.  Cardiac monitoring reviewed, normal sinus rhythm Social determinants considered, patient with poor social isolation and physical activity Critical Interventions: None  After the interventions stated above, I reevaluated the patient and found patient to be fairly comfortable appearing satting well on her stable 3 L Admission and further testing considered, no clear indications for admission.  Patient did not feel she would do well at home with her breathing and her nausea and inability to take p.o.  Discussed with [facility] hospitalist Dr. Sheran Lawless who will evaluate the patient for possible admission.          Final Clinical Impression(s) / ED Diagnoses Final diagnoses:  COPD exacerbation ([place])  Esophagitis    Rx / DC Orders ED Discharge Orders     None         Hayden Rasmussen, MD 06/28/22 1902

## 2022-06-28 NOTE — H&P (Signed)
History and Physical    Patient: [name]:[id number] DOB: 1956/10/12 DOA: 06/28/2022 DOS: the patient was seen and examined on 06/28/2022 PCP: [name], MD  Patient coming from: Home  Chief Complaint:  Chief Complaint  Patient presents with   Shortness of Breath   HPI: [name] is a 66 y.o. 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  Patient seen for back pain, vomiting and difficulty swallowing.  Was seen by pulmonology yesterday and had a chest x-ray which did not look like she had a pneumonia.  She did have a sputum culture which was growing out Pseudomonas.  She was prescribed Levaquin for this.  She also had blood work done and had an elevated D-dimer.  The patient was instructed to come to the hospital for evaluation due to the elevated D-dimer to ensure that she did not have a pulmonary embolism.  Here, she notes pain in her back which is moderate.  There is no radiation.  No palliating or provoking factors.  She was also noted to have a lot of nausea and vomiting.  She describes her emesis as black.  This has been going on over the past couple of weeks.  She was hospitalized a couple of months ago due to distal esophagitis.  She was discharged on Protonix and Carafate.  She completed the course of Carafate and has continued the PPI.  She does find it difficult to swallow occasionally.  Denies odynophagia.  No cough, shortness of breath, wheezing.  Review of Systems: As mentioned in the history of present illness. All other systems reviewed and are negative. Past Medical History:  Diagnosis Date   Anemia    Aortic atherosclerosis (HCC)    Arthritis    Asthma    Bilateral carotid artery stenosis    Cancer (HCC)    Gastric Cancer   COPD (chronic obstructive pulmonary disease) (HCC)    High  cholesterol    Hyperlipidemia    Hypertension    Neuropathy    Osteoarthritis    Ovarian cancer ([place])    Oxygen dependent    Peripheral vascular disease ([place])    Peritoneal carcinoma ([place])    Pneumonia 2019   Port-A-Cath in place 07/03/2020   Pulmonary emphysema ([place])    Past Surgical History:  Procedure Laterality Date   ESOPHAGOGASTRODUODENOSCOPY (EGD) WITH PROPOFOL N/A 02/17/2021   Procedure: ESOPHAGOGASTRODUODENOSCOPY (EGD) WITH PROPOFOL;  Surgeon: Harvel Quale, MD;  [facility];  Service: Gastroenterology;  Laterality: N/A;   ESOPHAGOGASTRODUODENOSCOPY (EGD) WITH PROPOFOL N/A 05/04/2022   Procedure: ESOPHAGOGASTRODUODENOSCOPY (EGD) WITH PROPOFOL;  Surgeon: Daneil Dolin, MD;  Location: [facility];  Service: Endoscopy;  Laterality: N/A;   HALLUX VALGUS BASE WEDGE Right 06/09/2015   Procedure: Base wedge osteotomy with modified McBride right foot ;  Surgeon: Sharlotte Alamo, MD;  Location: [facility];  Service: Podiatry;  Laterality: Right;   PORTACATH PLACEMENT Left 06/28/2020   Procedure: PORT-A-CATHETER PLACEMENT LEFT CHEST (attached catheter in left subclavian);  Surgeon: Virl Cagey, MD;  Location: [facility];  Service: General;  Laterality: Left;   TUBAL LIGATION     VIDEO BRONCHOSCOPY WITH ENDOBRONCHIAL NAVIGATION N/A 03/09/2021   Procedure: VIDEO BRONCHOSCOPY WITH ENDOBRONCHIAL NAVIGATION;  Surgeon: Ottie Glazier, MD;  Location: [facility];  Service: Thoracic;  Laterality: N/A;   VIDEO BRONCHOSCOPY WITH ENDOBRONCHIAL ULTRASOUND N/A 03/09/2021  Procedure: VIDEO BRONCHOSCOPY WITH ENDOBRONCHIAL ULTRASOUND;  Surgeon: Ottie Glazier, MD;  Location: [facility];  Service: Thoracic;  Laterality: N/A;   Social History:  reports that she quit smoking about 8 years ago. Her smoking use included cigarettes. She has a 60.00 pack-year smoking history. She has never used smokeless tobacco. She reports that she does not drink alcohol and does not use drugs.  No Known  Allergies  Family History  Problem Relation Age of Onset   Alzheimer's disease Mother    COPD Father    Emphysema Father    Hypertension Father    Healthy Sister    Healthy Brother    Alzheimer's disease Maternal Grandmother    Healthy Sister    Healthy Sister    Healthy Sister    Prostate cancer Other        paternal grandmother's brother; dx in early 42s   Breast cancer Neg Hx     Prior to Admission medications   Medication Sig Start Date End Date Taking? Authorizing Provider  Acetylcysteine (MUCOMYST-10 IN) Inhale 1 vial into the lungs every three (3) days as needed (copd).   Yes [provider]  albuterol (VENTOLIN HFA) 108 (90 Base) MCG/ACT inhaler Inhale 2 puffs into the lungs every 6 (six) hours as needed for wheezing or shortness of breath.   Yes [provider]  amLODipine (NORVASC) 5 MG tablet Take 5 mg by mouth at bedtime.    Yes [provider]  aspirin EC 81 MG tablet Take 81 mg by mouth daily.   Yes [provider]  atorvastatin (LIPITOR) 40 MG tablet Take 1 tablet (40 mg total) by mouth daily. 01/02/22  Yes Dessa Phi, DO  ferrous sulfate 325 (65 FE) MG tablet Take 325 mg by mouth every other day.   Yes [provider]  Fluticasone-Umeclidin-Vilant (TRELEGY ELLIPTA) 100-62.5-25 MCG/ACT AEPB Inhale into the lungs. 06/18/21  Yes [provider]  HYDROcodone-acetaminophen (NORCO/VICODIN) 5-325 MG tablet Take 1 tablet by mouth 4 (four) times daily as needed for pain. 06/27/22  Yes [provider]  ipratropium (ATROVENT HFA) 17 MCG/ACT inhaler Inhale 2 puffs into the lungs every 6 (six) hours as needed for wheezing. 03/13/22  Yes Johnson, Clanford L, MD  levalbuterol (XOPENEX) 1.25 MG/3ML nebulizer solution Inhale into the lungs. 09/28/21  Yes [provider]  magnesium oxide (MAG-OX) 400 (240 Mg) MG tablet Take 1 tablet by mouth twice daily Patient taking differently: Take 400 mg by mouth 2 (two) times  daily. 03/04/22  Yes Derek Jack, MD  Niraparib Tosylate (ZEJULA) 200 MG TABS Take 1 tablet by mouth daily. 06/25/22  Yes Derek Jack, MD  nortriptyline (PAMELOR) 25 MG capsule Take 25 mg by mouth at bedtime.   Yes [provider]  OXYGEN Inhale 3-4 L into the lungs continuous.   Yes [provider]  pantoprazole (PROTONIX) 40 MG tablet Take 1 tablet (40 mg total) by mouth 2 (two) times daily. 05/05/22 08/03/22 Yes Shah, Pratik D, DO  predniSONE (DELTASONE) 5 MG tablet Take 5 mg by mouth daily with breakfast.   Yes [provider]  primidone (MYSOLINE) 50 MG tablet Take 50 mg by mouth at bedtime. 01/30/22  Yes [provider]  levofloxacin (LEVAQUIN) 500 MG tablet Take 500 mg by mouth daily. 06/27/22   [provider]  sucralfate (CARAFATE) 1 GM/10ML suspension Take 10 mLs (1 g total) by mouth 4 (four) times daily -  with meals and at bedtime for 14 days. Patient  not taking: Reported on 06/28/2022 05/05/22 05/19/22  Heath Lark D, DO    Physical Exam: Vitals:   06/28/22 1121 06/28/22 1130 06/28/22 1330 06/28/22 1510  BP: 99/72 129/81 121/68 120/83  Pulse: 99 98 79 80  Resp: '20 19 18 14  '$ Temp: 97.8 F (36.6 C)   (!) 97.5 F (36.4 C)  TempSrc: Oral     SpO2: 98% 99% 99% 99%  Weight:      Height:       General: Elderly female. Awake and alert and oriented x3. No acute cardiopulmonary distress.  HEENT: Normocephalic atraumatic.  Right and left ears normal in appearance.  Pupils equal, round, reactive to light. Extraocular muscles are intact. Sclerae anicteric and noninjected.  Moist mucosal membranes. No mucosal lesions.  Neck: Neck supple without lymphadenopathy. No carotid bruits. No masses palpated.  Cardiovascular: Regular rate with normal S1-S2 sounds. No murmurs, rubs, gallops auscultated. No JVD.  Respiratory: Nonlabored breathing.  Rales bilaterally to the mid lung field.  No wheezing.  No accessory muscle use. Abdomen: Soft,  nontender, nondistended. Active bowel sounds. No masses or hepatosplenomegaly  Skin: No rashes, lesions, or ulcerations.  Dry, warm to touch. 2+ dorsalis pedis and radial pulses. Musculoskeletal: No calf or leg pain. All major joints not erythematous nontender.  No upper or lower joint deformation.  Good ROM.  No contractures  Psychiatric: Intact judgment and insight. Pleasant and cooperative. Neurologic: No focal neurological deficits. Strength is 5/5 and symmetric in upper and lower extremities.  Cranial nerves II through XII are grossly intact.  Data Reviewed: Results for orders placed or performed during the hospital encounter of 06/28/22 (from the past 24 hour(s))  Basic metabolic panel     Status: Abnormal   Collection Time: 06/28/22 12:02 PM  Result Value Ref Range   Sodium 136 135 - 145 mmol/L   Potassium 3.4 (L) 3.5 - 5.1 mmol/L   Chloride 91 (L) 98 - 111 mmol/L   CO2 36 (H) 22 - 32 mmol/L   Glucose, Bld 117 (H) 70 - 99 mg/dL   BUN 29 (H) 8 - 23 mg/dL   Creatinine, Ser 0.69 0.44 - 1.00 mg/dL   Calcium 8.6 (L) 8.9 - 10.3 mg/dL   GFR, Estimated >60 >60 mL/min   Anion gap 9 5 - 15  Troponin I (High Sensitivity)     Status: None   Collection Time: 06/28/22 12:02 PM  Result Value Ref Range   Troponin I (High Sensitivity) 7 <18 ng/L  CBC with Differential     Status: Abnormal   Collection Time: 06/28/22 12:02 PM  Result Value Ref Range   WBC 8.8 4.0 - 10.5 K/uL   RBC 2.85 (L) 3.87 - 5.11 MIL/uL   Hemoglobin 9.0 (L) 12.0 - 15.0 g/dL   HCT 28.0 (L) 36.0 - 46.0 %   MCV 98.2 80.0 - 100.0 fL   MCH 31.6 26.0 - 34.0 pg   MCHC 32.1 30.0 - 36.0 g/dL   RDW 16.4 (H) 11.5 - 15.5 %   Platelets 123 (L) 150 - 400 K/uL   nRBC 0.0 0.0 - 0.2 %   Neutrophils Relative % 72 %   Neutro Abs 6.4 1.7 - 7.7 K/uL   Lymphocytes Relative 13 %   Lymphs Abs 1.2 0.7 - 4.0 K/uL   Monocytes Relative 12 %   Monocytes Absolute 1.1 (H) 0.1 - 1.0 K/uL   Eosinophils Relative 1 %   Eosinophils Absolute 0.1  0.0 - 0.5 K/uL  Basophils Relative 1 %   Basophils Absolute 0.1 0.0 - 0.1 K/uL   Immature Granulocytes 1 %   Abs Immature Granulocytes 0.04 0.00 - 0.07 K/uL  Protime-INR     Status: None   Collection Time: 06/28/22 12:02 PM  Result Value Ref Range   Prothrombin Time 12.1 11.4 - 15.2 seconds   INR 0.9 0.8 - 1.2  Brain natriuretic peptide     Status: None   Collection Time: 06/28/22 12:03 PM  Result Value Ref Range   B Natriuretic Peptide 27.0 0.0 - 100.0 pg/mL  SARS Coronavirus 2 by RT PCR (hospital order, performed in [place] hospital lab) *cepheid single result test* Anterior Nasal Swab     Status: None   Collection Time: 06/28/22 12:11 PM   Specimen: Anterior Nasal Swab  Result Value Ref Range   SARS Coronavirus 2 by RT PCR NEGATIVE NEGATIVE  Troponin I (High Sensitivity)     Status: None   Collection Time: 06/28/22  2:12 PM  Result Value Ref Range   Troponin I (High Sensitivity) 6 <18 ng/L   CT Angio Chest PE W/Cm &/Or Wo Cm  Result Date: 06/28/2022 CLINICAL DATA:  Shortness of breath EXAM: CT ANGIOGRAPHY CHEST WITH CONTRAST TECHNIQUE: Multidetector CT imaging of the chest was performed using the standard protocol during bolus administration of intravenous contrast. Multiplanar CT image reconstructions and MIPs were obtained to evaluate the vascular anatomy. RADIATION DOSE REDUCTION: This exam was performed according to the departmental dose-optimization program which includes automated exposure control, adjustment of the mA and/or kV according to patient size and/or use of iterative reconstruction technique. CONTRAST:  79m OMNIPAQUE IOHEXOL 350 MG/ML SOLN COMPARISON:  05/03/2022 FINDINGS: Cardiovascular: Satisfactory opacification of the pulmonary arteries to the segmental level. No evidence of pulmonary embolism. Normal heart size. No pericardial effusion. Moderate coronary artery calcifications. Aortic atherosclerosis. Mediastinum/Nodes: Redemonstrated moderate to severe  circumferential wall thickening in the lower esophagus, similar to the prior exam. New moderate wall thickening in the upper and mid esophagus. Fluid in the lower esophagus, which increases the risk of aspiration. No enlarged mediastinal, hilar, or axillary lymph nodes. The trachea is patent. Lungs/Pleura: Redemonstrated bronchiectasis of the left lower lobe and lingula with clustered centrilobular nodules, slightly improved in the lingula, compared to the prior exam. Unchanged linear opacities in the bilateral upper lobes, likely scarring. No pleural effusion or pneumothorax. Apical predominant centrilobular and paraseptal emphysema. Upper Abdomen: No acute abnormality. Musculoskeletal: S shaped curvature of the thoracolumbar spine. No acute osseous abnormality. No chest wall abnormality. Review of the MIP images confirms the above findings. IMPRESSION: 1. Negative for pulmonary embolism. 2. Moderate circumferential wall thickening in the proximal esophagus, which is new compared to 05/03/2022, with redemonstrated moderate to severe thickening of the distal esophagus, likely representing esophagitis. Endoscopy is again recommended for further evaluation. In addition, there is fluid in the lower esophagus, which increases the risk of aspiration. 3. Redemonstrated bronchiectasis in the lingula and left lower lobe with clustered centrilobular nodules, which have improved slightly in the lingula but remain concerning for chronic atypical infection or recurrent aspiration. 4. Aortic Atherosclerosis (ICD10-I70.0) and Emphysema (ICD10-J43.9). Electronically Signed   By: AMerilyn BabaM.D.   On: 06/28/2022 14:15   DG Chest Portable 1 View  Result Date: 06/28/2022 CLINICAL DATA:  Chest pain for 3 weeks. History of COPD. Patient on home oxygen. EXAM: PORTABLE CHEST 1 VIEW COMPARISON:  Chest radiograph, 05/03/2022 and prior studies. Chest CT, 05/03/2022. FINDINGS: Cardiac silhouette normal in size.  No mediastinal or hilar  masses. Stable left anterior chest wall Port-A-Cath. Stable areas of lung scarring and interstitial thickening. No lung consolidation. No evidence of edema. Relative volume loss on the left. No pleural effusion or pneumothorax. Skeletal structures are grossly intact. IMPRESSION: 1. No acute cardiopulmonary disease. 2. Chronic lung changes stable from the prior study. Electronically Signed   By: Lajean Manes M.D.   On: 06/28/2022 11:44     Assessment and Plan: No notes have been filed under this hospital service. Service: Hospitalist  Principal Problem:   Esophagitis Active Problems:   COPD (chronic obstructive pulmonary disease) (HCC)   Hypertension   Chronic respiratory failure with hypoxia (HCC)   Peritoneal carcinomatosis (HCC)   Ovarian cancer (HCC)   Upper GI bleed  Esophagitis Observation Consult GI PPI Carafate   Upper GI bleed PPI and Carafate Clear liquids and n.p.o. after midnight Consult GI  COPD with chronic respiratory failure and Pseudomonas on sputum culture Continue Levaquin Ovarian cancer with peritoneal carcinomatosis On chemo No bony metastasis evident on x-ray and CT Hypertension Continue antihypertensive   Advance Care Planning:   Code Status: Prior full code confirmed by patient  Consults: GI  Family Communication: Daughter present during interview and exam  Severity of Illness: The appropriate patient status for this patient is OBSERVATION. Observation status is judged to be reasonable and necessary in order to provide the required intensity of service to ensure the patient's safety. The patient's presenting symptoms, physical exam findings, and initial radiographic and laboratory data in the context of their medical condition is felt to place them at decreased risk for further clinical deterioration. Furthermore, it is anticipated that the patient will be medically stable for discharge from the hospital within 2 midnights of admission.    Author: Truett Mainland, DO 06/28/2022 4:31 PM  For on call review [email or web address].

## 2022-06-28 NOTE — ED Triage Notes (Signed)
Pt presents with SOB x 1 week, since at PCP on yesterday for same, per pt was told to go home and take a pain pill, also having back pain, PMH includes COPD, chronically on 3L.

## 2022-06-28 NOTE — Plan of Care (Signed)
  Problem: Health Behavior/Discharge Planning: Goal: Ability to manage health-related needs will improve Outcome: Progressing   Problem: Clinical Measurements: Goal: Cardiovascular complication will be avoided Outcome: Progressing   Problem: Activity: Goal: Risk for activity intolerance will decrease Outcome: Progressing   Problem: Nutrition: Goal: Adequate nutrition will be maintained Outcome: Progressing   Problem: Coping: Goal: Level of anxiety will decrease Outcome: Progressing   Problem: Elimination: Goal: Will not experience complications related to bowel motility Outcome: Progressing Goal: Will not experience complications related to urinary retention Outcome: Progressing   Problem: Pain Managment: Goal: General experience of comfort will improve Outcome: Progressing   Problem: Safety: Goal: Ability to remain free from injury will improve Outcome: Progressing   Problem: Skin Integrity: Goal: Risk for impaired skin integrity will decrease Outcome: Progressing

## 2022-06-29 DIAGNOSIS — D62 Acute posthemorrhagic anemia: Secondary | ICD-10-CM

## 2022-06-29 DIAGNOSIS — K922 Gastrointestinal hemorrhage, unspecified: Secondary | ICD-10-CM | POA: Diagnosis not present

## 2022-06-29 DIAGNOSIS — K209 Esophagitis, unspecified without bleeding: Secondary | ICD-10-CM | POA: Diagnosis not present

## 2022-06-29 LAB — PREPARE RBC (CROSSMATCH)

## 2022-06-29 LAB — CBC
HCT: 21.2 % — ABNORMAL LOW (ref 36.0–46.0)
Hemoglobin: 6.7 g/dL — CL (ref 12.0–15.0)
MCH: 31.6 pg (ref 26.0–34.0)
MCHC: 31.6 g/dL (ref 30.0–36.0)
MCV: 100 fL (ref 80.0–100.0)
Platelets: 92 10*3/uL — ABNORMAL LOW (ref 150–400)
RBC: 2.12 MIL/uL — ABNORMAL LOW (ref 3.87–5.11)
RDW: 16.7 % — ABNORMAL HIGH (ref 11.5–15.5)
WBC: 4.9 10*3/uL (ref 4.0–10.5)
nRBC: 0 % (ref 0.0–0.2)

## 2022-06-29 LAB — BASIC METABOLIC PANEL
Anion gap: 6 (ref 5–15)
BUN: 11 mg/dL (ref 8–23)
CO2: 31 mmol/L (ref 22–32)
Calcium: 7.6 mg/dL — ABNORMAL LOW (ref 8.9–10.3)
Chloride: 98 mmol/L (ref 98–111)
Creatinine, Ser: 0.54 mg/dL (ref 0.44–1.00)
GFR, Estimated: >60 mL/min (ref >60)
Glucose, Bld: 87 mg/dL (ref 70–99)
Potassium: 3.3 mmol/L — ABNORMAL LOW (ref 3.5–5.1)
Sodium: 135 mmol/L (ref 135–145)

## 2022-06-29 MED ORDER — POTASSIUM CHLORIDE CRYS ER 20 MEQ PO TBCR
40.0000 meq | EXTENDED_RELEASE_TABLET | ORAL | Status: AC
  Administered 2022-06-29 (×2): 40 meq via ORAL
  Filled 2022-06-29 (×2): qty 2

## 2022-06-29 MED ORDER — CHLORHEXIDINE GLUCONATE CLOTH 2 % EX PADS
6.0000 | MEDICATED_PAD | Freq: Every day | CUTANEOUS | Status: DC
  Administered 2022-06-29 – 2022-07-01 (×3): 6 via TOPICAL

## 2022-06-29 MED ORDER — SODIUM CHLORIDE 0.9% IV SOLUTION: Freq: Once | INTRAVENOUS | Status: AC

## 2022-06-29 MED ORDER — FUROSEMIDE 10 MG/ML IJ SOLN
20.0000 mg | Freq: Once | INTRAMUSCULAR | Status: AC
  Administered 2022-06-29: 20 mg via INTRAVENOUS
  Filled 2022-06-29: qty 2

## 2022-06-29 NOTE — Consult Note (Signed)
Consulting  Provider: Dr. Nehemiah Settle Primary Care Physician:  [name], MD Primary Gastroenterologist:  Dr. Jenetta Downer  Reason for Consultation: Coffee-ground emesis, upper GI bleeding  HPI:  [name] is a 66 y.o. female with a past medical history of stage IV ovarian cancer, known peritoneal carcinomatosis, O2 dependent chronic respiratory failure due to COPD, who presented to [facility], ER yesterday due to elevated D-dimer, pseudomonal pneumonia.    CT angiogram which I personally reviewed, negative for PE though did show extensive esophagitis including proximal esophagus which is new, fluid in the distal portion of her esophagus.  Patient notes multiple episodes of vomiting.  Describes it as coffee-ground.  Similar presentation June 2023 with EGD showing significant esophagitis distally.  States she has been taking her pantoprazole as prescribed twice daily.  States she uses Aleve 1-2 times per month.  Initial hemoglobin 9 which is dropped to 6.7 this morning.  PRBCs to be given today.  Has been started on IV Protonix.  Eating breakfast during my interview and exam, clear liquids.  No vomiting since yesterday.  Past Medical History:  Diagnosis Date   Anemia    Aortic atherosclerosis (HCC)    Arthritis    Asthma    Bilateral carotid artery stenosis    Cancer (HCC)    Gastric Cancer   COPD (chronic obstructive pulmonary disease) (HCC)    High cholesterol    Hyperlipidemia    Hypertension    Neuropathy    Osteoarthritis    Ovarian cancer ([place])    Oxygen dependent    Peripheral vascular disease ([place])    Peritoneal carcinoma ([place])    Pneumonia 2019   Port-A-Cath in place 07/03/2020   Pulmonary emphysema ([place])     Past Surgical History:  Procedure Laterality Date   ESOPHAGOGASTRODUODENOSCOPY (EGD) WITH PROPOFOL N/A 02/17/2021   Procedure: ESOPHAGOGASTRODUODENOSCOPY (EGD) WITH PROPOFOL;  Surgeon: Harvel Quale, MD;  [facility];  Service:  Gastroenterology;  Laterality: N/A;   ESOPHAGOGASTRODUODENOSCOPY (EGD) WITH PROPOFOL N/A 05/04/2022   Procedure: ESOPHAGOGASTRODUODENOSCOPY (EGD) WITH PROPOFOL;  Surgeon: Daneil Dolin, MD;  [facility];  Service: Endoscopy;  Laterality: N/A;   HALLUX VALGUS BASE WEDGE Right 06/09/2015   Procedure: Base wedge osteotomy with modified McBride right foot ;  Surgeon: Sharlotte Alamo, MD;  Location: [facility] ORS;  Service: Podiatry;  Laterality: Right;   PORTACATH PLACEMENT Left 06/28/2020   Procedure: PORT-A-CATHETER PLACEMENT LEFT CHEST (attached catheter in left subclavian);  Surgeon: Virl Cagey, MD;  Location: [facility];  Service: General;  Laterality: Left;   TUBAL LIGATION     VIDEO BRONCHOSCOPY WITH ENDOBRONCHIAL NAVIGATION N/A 03/09/2021   Procedure: VIDEO BRONCHOSCOPY WITH ENDOBRONCHIAL NAVIGATION;  Surgeon: Ottie Glazier, MD;  Location: [facility] ORS;  Service: Thoracic;  Laterality: N/A;   VIDEO BRONCHOSCOPY WITH ENDOBRONCHIAL ULTRASOUND N/A 03/09/2021   Procedure: VIDEO BRONCHOSCOPY WITH ENDOBRONCHIAL ULTRASOUND;  Surgeon: Ottie Glazier, MD;  Location: [facility] ORS;  Service: Thoracic;  Laterality: N/A;    Prior to Admission medications   Medication Sig Start Date End Date Taking? Authorizing Provider  Acetylcysteine (MUCOMYST-10 IN) Inhale 1 vial into the lungs every three (3) days as needed (copd).   Yes [provider]  albuterol (VENTOLIN HFA) 108 (90 Base) MCG/ACT inhaler Inhale 2 puffs into the lungs every 6 (six) hours as needed for wheezing or shortness of breath.   Yes [provider]  amLODipine (NORVASC) 5 MG tablet Take 5 mg by mouth at bedtime.  Yes [provider]  aspirin EC 81 MG tablet Take 81 mg by mouth daily.   Yes [provider]  atorvastatin (LIPITOR) 40 MG tablet Take 1 tablet (40 mg total) by mouth daily. 01/02/22  Yes Dessa Phi, DO  ferrous sulfate 325 (65 FE) MG tablet Take 325 mg by mouth every other day.   Yes  [provider]  Fluticasone-Umeclidin-Vilant (TRELEGY ELLIPTA) 100-62.5-25 MCG/ACT AEPB Inhale into the lungs. 06/18/21  Yes [provider]  HYDROcodone-acetaminophen (NORCO/VICODIN) 5-325 MG tablet Take 1 tablet by mouth 4 (four) times daily as needed for pain. 06/27/22  Yes [provider]  ipratropium (ATROVENT HFA) 17 MCG/ACT inhaler Inhale 2 puffs into the lungs every 6 (six) hours as needed for wheezing. 03/13/22  Yes Johnson, Clanford L, MD  levalbuterol (XOPENEX) 1.25 MG/3ML nebulizer solution Inhale into the lungs. 09/28/21  Yes [provider]  magnesium oxide (MAG-OX) 400 (240 Mg) MG tablet Take 1 tablet by mouth twice daily Patient taking differently: Take 400 mg by mouth 2 (two) times daily. 03/04/22  Yes Derek Jack, MD  Niraparib Tosylate (ZEJULA) 200 MG TABS Take 1 tablet by mouth daily. 06/25/22  Yes Derek Jack, MD  nortriptyline (PAMELOR) 25 MG capsule Take 25 mg by mouth at bedtime.   Yes [provider]  OXYGEN Inhale 3-4 L into the lungs continuous.   Yes [provider]  pantoprazole (PROTONIX) 40 MG tablet Take 1 tablet (40 mg total) by mouth 2 (two) times daily. 05/05/22 08/03/22 Yes Shah, Pratik D, DO  predniSONE (DELTASONE) 5 MG tablet Take 5 mg by mouth daily with breakfast.   Yes [provider]  primidone (MYSOLINE) 50 MG tablet Take 50 mg by mouth at bedtime. 01/30/22  Yes [provider]  levofloxacin (LEVAQUIN) 500 MG tablet Take 500 mg by mouth daily. 06/27/22   [provider]  sucralfate (CARAFATE) 1 GM/10ML suspension Take 10 mLs (1 g total) by mouth 4 (four) times daily -  with meals and at bedtime for 14 days. Patient not taking: Reported on 06/28/2022 05/05/22 05/19/22  Heath Lark D, DO    Current Facility-Administered Medications  Medication Dose Route Frequency Provider Last Rate Last Admin   albuterol (PROVENTIL) (2.5 MG/3ML) 0.083% nebulizer solution 2.5 mg  2.5 mg  Nebulization Q6H PRN Truett Mainland, DO       amLODipine (NORVASC) tablet 5 mg  5 mg Oral QHS Truett Mainland, DO   5 mg at 06/28/22 2118   aspirin EC tablet 81 mg  81 mg Oral Daily Truett Mainland, DO   81 mg at 06/29/22 5784   atorvastatin (LIPITOR) tablet 40 mg  40 mg Oral QHS Truett Mainland, DO   40 mg at 06/28/22 2118   Chlorhexidine Gluconate Cloth 2 % PADS 6 each  6 each Topical Daily Truett Mainland, DO   6 each at 06/29/22 0844   fluticasone furoate-vilanterol (BREO ELLIPTA) 100-25 MCG/ACT 1 puff  1 puff Inhalation Daily Truett Mainland, DO   1 puff at 06/29/22 0855   And   umeclidinium bromide (INCRUSE ELLIPTA) 62.5 MCG/ACT 1 puff  1 puff Inhalation Daily Truett Mainland, DO   1 puff at 06/29/22 0855   furosemide (LASIX) injection 20 mg  20 mg Intravenous Once Roxan Hockey, MD       HYDROcodone-acetaminophen (NORCO/VICODIN) 5-325 MG per tablet 1 tablet  1 tablet Oral QID PRN Truett Mainland, DO   1 tablet at 06/29/22  7846   ipratropium-albuterol (DUONEB) 0.5-2.5 (3) MG/3ML nebulizer solution 3 mL  3 mL Nebulization Q6H PRN Truett Mainland, DO       lactated ringers infusion   Intravenous Continuous Truett Mainland, DO 125 mL/hr at 06/29/22 0301 New Bag at 06/29/22 0301   levofloxacin (LEVAQUIN) tablet 500 mg  500 mg Oral Q24H Truett Mainland, DO   500 mg at 06/28/22 1935   magnesium oxide (MAG-OX) tablet 400 mg  400 mg Oral BID Truett Mainland, DO   400 mg at 06/29/22 9629   Niraparib Tosylate TABS 1 tablet  1 tablet Oral Daily Truett Mainland, DO       nortriptyline (PAMELOR) capsule 25 mg  25 mg Oral QHS Stinson, Jacob J, DO       ondansetron [facility]) tablet 4 mg  4 mg Oral Q6H PRN Truett Mainland, DO       Or   ondansetron [facility]) injection 4 mg  4 mg Intravenous Q6H PRN Truett Mainland, DO   4 mg at 06/28/22 1939   pantoprazole (PROTONIX) injection 40 mg  40 mg Intravenous Q12H Truett Mainland, DO   40 mg at 06/29/22 5284   potassium chloride SA (KLOR-CON M)  CR tablet 40 mEq  40 mEq Oral Q3H Emokpae, Courage, MD   40 mEq at 06/29/22 0842   predniSONE (DELTASONE) tablet 5 mg  5 mg Oral Q breakfast Truett Mainland, DO   5 mg at 06/29/22 1324   primidone (MYSOLINE) tablet 50 mg  50 mg Oral QHS Truett Mainland, DO   50 mg at 06/28/22 2118   sucralfate (CARAFATE) 1 GM/10ML suspension 1 g  1 g Oral TID WC & HS Truett Mainland, DO   1 g at 06/29/22 0820    Allergies as of 06/28/2022   (No Known Allergies)    Family History  Problem Relation Age of Onset   Alzheimer's disease Mother    COPD Father    Emphysema Father    Hypertension Father    Healthy Sister    Healthy Brother    Alzheimer's disease Maternal Grandmother    Healthy Sister    Healthy Sister    Healthy Sister    Prostate cancer Other        paternal grandmother's brother; dx in early 99s   Breast cancer Neg Hx     Social History   Socioeconomic History   Marital status: Divorced    Spouse name: Not on file   Number of children: 3   Years of education: Not on file   Highest education level: Not on file  Occupational History   Occupation: DISABLED  Tobacco Use   Smoking status: Former    Packs/day: 2.00    Years: 30.00    Total pack years: 60.00    Types: Cigarettes    Quit date: 11/04/2013    Years since quitting: 8.6   Smokeless tobacco: Never  Vaping Use   Vaping Use: Never used  Substance and Sexual Activity   Alcohol use: No   Drug use: No   Sexual activity: Not Currently  Other Topics Concern   Not on file  Social History Narrative   Not on file   Social Determinants of Health   Financial Resource Strain: Low Risk  (09/27/2020)   Overall Financial Resource Strain (CARDIA)    Difficulty of Paying Living Expenses: Not hard at all  Food Insecurity: No Food Insecurity (09/27/2020)  Hunger Vital Sign    Worried About Running Out of Food in the Last Year: Never true    Ran Out of Food in the Last Year: Never true  Transportation Needs: No  Transportation Needs (09/27/2020)   PRAPARE - Hydrologist (Medical): No    Lack of Transportation (Non-Medical): No  Physical Activity: Inactive (09/27/2020)   Exercise Vital Sign    Days of Exercise per Week: 0 days    Minutes of Exercise per Session: 0 min  Stress: No Stress Concern Present (09/27/2020)   [place]    Feeling of Stress : Not at all  Social Connections: Socially Isolated (09/27/2020)   Social Connection and Isolation Panel [NHANES]    Frequency of Communication with Friends and Family: More than three times a week    Frequency of Social Gatherings with Friends and Family: Never    Attends Religious Services: Never    Marine scientist or Organizations: No    Attends Archivist Meetings: Never    Marital Status: Divorced  Human resources officer Violence: Not At Risk (09/27/2020)   Humiliation, Afraid, Rape, and Kick questionnaire    Fear of Current or Ex-Partner: No    Emotionally Abused: No    Physically Abused: No    Sexually Abused: No    Review of Systems: General: Negative for anorexia, weight loss, fever, chills, fatigue, weakness. Eyes: Negative for vision changes.  ENT: Negative for hoarseness, difficulty swallowing , nasal congestion. CV: Negative for chest pain, angina, palpitations, dyspnea on exertion, peripheral edema.  Respiratory: Negative for dyspnea at rest, dyspnea on exertion, cough, sputum, wheezing.  GI: See history of present illness. GU:  Negative for dysuria, hematuria, urinary incontinence, urinary frequency, nocturnal urination.  MS: Negative for joint pain, low back pain.  Derm: Negative for rash or itching.  Neuro: Negative for weakness, abnormal sensation, seizure, frequent headaches, memory loss, confusion.  Psych: Negative for anxiety, depression Endo: Negative for unusual weight change.  Heme: Negative for bruising or  bleeding. Allergy: Negative for rash or hives.  Physical Exam: Vital signs in last 24 hours: Temp:  [97.5 F (36.4 C)-98.5 F (36.9 C)] 98.5 F (36.9 C) (08/12 0432) Pulse Rate:  [78-99] 87 (08/12 0432) Resp:  [14-20] 14 (08/12 0432) BP: (99-157)/(68-133) 157/133 (08/12 0432) SpO2:  [98 %-100 %] 98 % (08/12 0857) Weight:  [55.1 kg-56.2 kg] 55.1 kg (08/11 1800) Last BM Date : 06/27/22 General:   Alert,  Well-developed, well-nourished, pleasant and cooperative in NAD Head:  Normocephalic and atraumatic. Eyes:  Sclera clear, no icterus.   Conjunctiva pink. Ears:  Normal auditory acuity. Nose:  No deformity, discharge,  or lesions. Mouth:  No deformity or lesions, dentition normal. Neck:  Supple; no masses or thyromegaly. Lungs:  Clear throughout to auscultation.   No wheezes, crackles, or rhonchi. No acute distress. Heart:  Regular rate and rhythm; no murmurs, clicks, rubs,  or gallops. Abdomen:  Soft, nontender and nondistended. No masses, hepatosplenomegaly or hernias noted. Normal bowel sounds, without guarding, and without rebound.   Msk:  Symmetrical without gross deformities. Normal posture. Pulses:  Normal pulses noted. Extremities:  Without clubbing or edema. Neurologic:  Alert and  oriented x4;  grossly normal neurologically. Skin:  Intact without significant lesions or rashes. Cervical Nodes:  No significant cervical adenopathy. Psych:  Alert and cooperative. Normal mood and affect.  Intake/Output from previous day: 08/11 0701 - 08/12 0700  In: 390 [P.O.:240; IV Piggyback:150] Out: -  Intake/Output this shift: No intake/output data recorded.  Lab Results: Recent Labs    06/28/22 1202 06/29/22 0500  WBC 8.8 4.9  HGB 9.0* 6.7*  HCT 28.0* 21.2*  PLT 123* 92*   BMET Recent Labs    06/28/22 1202 06/29/22 0500  NA 136 135  K 3.4* 3.3*  CL 91* 98  CO2 36* 31  GLUCOSE 117* 87  BUN 29* 11  CREATININE 0.69 0.54  CALCIUM 8.6* 7.6*   LFT No results for  input(s): "PROT", "ALBUMIN", "AST", "ALT", "ALKPHOS", "BILITOT", "BILIDIR", "IBILI" in the last 72 hours. PT/INR Recent Labs    06/28/22 1202  LABPROT 12.1  INR 0.9   Hepatitis Panel No results for input(s): "HEPBSAG", "HCVAB", "HEPAIGM", "HEPBIGM" in the last 72 hours. C-Diff No results for input(s): "CDIFFTOX" in the last 72 hours.  Studies/Results: CT Angio Chest PE W/Cm &/Or Wo Cm  Result Date: 06/28/2022 CLINICAL DATA:  Shortness of breath EXAM: CT ANGIOGRAPHY CHEST WITH CONTRAST TECHNIQUE: Multidetector CT imaging of the chest was performed using the standard protocol during bolus administration of intravenous contrast. Multiplanar CT image reconstructions and MIPs were obtained to evaluate the vascular anatomy. RADIATION DOSE REDUCTION: This exam was performed according to the departmental dose-optimization program which includes automated exposure control, adjustment of the mA and/or kV according to patient size and/or use of iterative reconstruction technique. CONTRAST:  15m OMNIPAQUE IOHEXOL 350 MG/ML SOLN COMPARISON:  05/03/2022 FINDINGS: Cardiovascular: Satisfactory opacification of the pulmonary arteries to the segmental level. No evidence of pulmonary embolism. Normal heart size. No pericardial effusion. Moderate coronary artery calcifications. Aortic atherosclerosis. Mediastinum/Nodes: Redemonstrated moderate to severe circumferential wall thickening in the lower esophagus, similar to the prior exam. New moderate wall thickening in the upper and mid esophagus. Fluid in the lower esophagus, which increases the risk of aspiration. No enlarged mediastinal, hilar, or axillary lymph nodes. The trachea is patent. Lungs/Pleura: Redemonstrated bronchiectasis of the left lower lobe and lingula with clustered centrilobular nodules, slightly improved in the lingula, compared to the prior exam. Unchanged linear opacities in the bilateral upper lobes, likely scarring. No pleural effusion or  pneumothorax. Apical predominant centrilobular and paraseptal emphysema. Upper Abdomen: No acute abnormality. Musculoskeletal: S shaped curvature of the thoracolumbar spine. No acute osseous abnormality. No chest wall abnormality. Review of the MIP images confirms the above findings. IMPRESSION: 1. Negative for pulmonary embolism. 2. Moderate circumferential wall thickening in the proximal esophagus, which is new compared to 05/03/2022, with redemonstrated moderate to severe thickening of the distal esophagus, likely representing esophagitis. Endoscopy is again recommended for further evaluation. In addition, there is fluid in the lower esophagus, which increases the risk of aspiration. 3. Redemonstrated bronchiectasis in the lingula and left lower lobe with clustered centrilobular nodules, which have improved slightly in the lingula but remain concerning for chronic atypical infection or recurrent aspiration. 4. Aortic Atherosclerosis (ICD10-I70.0) and Emphysema (ICD10-J43.9). Electronically Signed   By: AMerilyn BabaM.D.   On: 06/28/2022 14:15   DG Chest Portable 1 View  Result Date: 06/28/2022 CLINICAL DATA:  Chest pain for 3 weeks. History of COPD. Patient on home oxygen. EXAM: PORTABLE CHEST 1 VIEW COMPARISON:  Chest radiograph, 05/03/2022 and prior studies. Chest CT, 05/03/2022. FINDINGS: Cardiac silhouette normal in size. No mediastinal or hilar masses. Stable left anterior chest wall Port-A-Cath. Stable areas of lung scarring and interstitial thickening. No lung consolidation. No evidence of edema. Relative volume loss on the left. No pleural effusion or  pneumothorax. Skeletal structures are grossly intact. IMPRESSION: 1. No acute cardiopulmonary disease. 2. Chronic lung changes stable from the prior study. Electronically Signed   By: Lajean Manes M.D.   On: 06/28/2022 11:44    Impression: *Upper GI bleeding *Acute blood loss anemia due to above *Esophagitis  Plan: Etiology of patient's  coffee-ground emesis/upper GI bleeding likely due to severe esophagitis which appears to be worsening based on CT imaging.  Needs resuscitation with PRBCs today.  Continue to monitor H&H and transfuse for less than 7.  Agree with IV Protonix 40 mg twice daily.  Avoid NSAIDs.  We will tentatively plan on EGD tomorrow morning to further evaluate. The risks including infection, bleed, or perforation as well as benefits, limitations, alternatives and imponderables have been reviewed with the patient. Potential for esophageal dilation, biopsy, etc. have also been reviewed.  Questions have been answered. All parties agreeable.  Clear liquids today.  N.p.o. after midnight.  Thank you for the consultation  Elon Alas. Abbey Chatters, D.O. Gastroenterology and Hepatology [facility]    LOS: 0 days     06/29/2022, 10:51 AM

## 2022-06-29 NOTE — Progress Notes (Signed)
Date and time results received: 06/29/22   (use smartphrase ".now" to insert current time)now   Test: hbg Critical Value: 6.7  Name of Provider Notified: Courage Emokpae  Orders Received? Or Actions Taken?:  no orders yet

## 2022-06-29 NOTE — Plan of Care (Signed)

## 2022-06-29 NOTE — Progress Notes (Signed)
PROGRESS NOTE     [name], is a 66 y.o. female, DOB - 06-15-1956, OZD:[id number]  Admit date - 06/28/2022   Admitting Physician Truett Mainland, DO  Outpatient Primary MD for the patient is Adamo, Hattie Perch, MD  LOS - 0  Chief Complaint  Patient presents with   Shortness of Breath        Brief Narrative:  66 y.o. 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 after midnight for EGD -GI consult noted -Hgb is down to 6.7 transfused 2 units of PRBC with Lasix  2) recent Pseudomonas pulmonary infection superimposed on COPD -Continue Levaquin -Continue bronchodilators  3) esophagitis and dysphagia--- Protonix and Carafate as ordered -EGD as above #1  4) chronic hypoxic respiratory failure--- secondary to COPD -Continue nasal cannula 2 L/min which is baseline for patient  5) ovarian malignancy with peritoneal carcinomatosis -Undergoing chemo -Outpatient follow-up with oncologist  6)HTN--continue amlodipine   Disposition/Need for in-Hospital Stay- patient unable to be discharged at this time due to --GI bleed requiring EGD and transfusion of PRBCs*  Disposition: The patient is from: Home              Anticipated d/c is to: Home              Anticipated d/c date is: 1 day              Patient currently is not medically stable to d/c. Barriers: Not Clinically Stable-   Code Status :  -  Code Status: Full Code   Family Communication:    NA (patient is alert, awake and coherent)   DVT  Prophylaxis  :   - SCDs   SCDs Start: 06/28/22 1817   Lab Results  Component Value Date   PLT 92 (L) 06/29/2022    Inpatient Medications  Scheduled Meds:  amLODipine  5 mg Oral QHS   aspirin EC  81 mg Oral Daily   atorvastatin  40 mg Oral QHS   Chlorhexidine Gluconate Cloth  6 each Topical Daily   fluticasone furoate-vilanterol  1 puff Inhalation Daily   And   umeclidinium bromide  1 puff Inhalation Daily   levofloxacin  500 mg Oral Q24H   magnesium oxide  400 mg Oral BID   Niraparib Tosylate  1 tablet Oral Daily   nortriptyline  25 mg Oral QHS   pantoprazole (PROTONIX) IV  40 mg Intravenous Q12H   predniSONE  5 mg Oral Q breakfast   primidone  50 mg Oral QHS   sucralfate  1 g Oral TID WC & HS   Continuous Infusions:  lactated ringers Stopped (06/29/22 1219)   PRN Meds:.albuterol, HYDROcodone-acetaminophen, ipratropium-albuterol, ondansetron **OR** ondansetron (ZOFRAN) IV   Anti-infectives (From admission, onward)    Start     Dose/Rate Route Frequency Ordered Stop   06/28/22 1830  levofloxacin (LEVAQUIN) tablet 500 mg        500 mg Oral Every 24 hours 06/28/22 1816     06/28/22 1545  ampicillin-sulbactam (UNASYN) 1.5 g in sodium chloride 0.9 % 100 mL IVPB  Status:  Discontinued  1.5 g 200 mL/hr over 30 Minutes Intravenous Every 6 hours 06/28/22 1535 06/28/22 1816         Subjective: [name] today has no fevers, no emesis,  No chest pain,   - -Tolerating liquids okay Objective: Vitals:   06/29/22 1406 06/29/22 1425 06/29/22 1440 06/29/22 1658  BP: (!) 119/55 (!) 119/55 120/60 126/66  Pulse: 88 88 86 80  Resp: '18 18 18 18  '$ Temp: 99 F (37.2 C) 99 F (37.2 C) 99.2 F (37.3 C) 99.2 F (37.3 C)  TempSrc: Oral Oral Oral Oral  SpO2: 100% 100% 100%   Weight:      Height:        Intake/Output Summary (Last 24 hours) at 06/29/2022 1843 Last data filed at 06/29/2022 1700 Gross per 24 hour  Intake 1564 ml  Output 2350 ml  Net -786 ml   Filed  Weights   06/28/22 1119 06/28/22 1800  Weight: 56.2 kg 55.1 kg    Physical Exam  Gen:- Awake Alert,  in no apparent distress  HEENT:- [place].AT, No sclera icterus Nose- [place] 2L/min Neck-Supple Neck,No JVD,.  Lungs-breath sounds, no wheezing  CV- S1, S2 normal, regular  Abd-  +ve B.Sounds, Abd Soft, No tenderness,    Extremity/Skin:- No  edema, pedal pulses present  Psych-affect is appropriate, oriented x3 Neuro-no new focal deficits, no tremors  Data Reviewed: I have personally reviewed following labs and imaging studies  CBC: Recent Labs  Lab 06/28/22 1202 06/29/22 0500  WBC 8.8 4.9  NEUTROABS 6.4  --   HGB 9.0* 6.7*  HCT 28.0* 21.2*  MCV 98.2 100.0  PLT 123* 92*   Basic Metabolic Panel: Recent Labs  Lab 06/28/22 1202 06/29/22 0500  NA 136 135  K 3.4* 3.3*  CL 91* 98  CO2 36* 31  GLUCOSE 117* 87  BUN 29* 11  CREATININE 0.69 0.54  CALCIUM 8.6* 7.6*   GFR: Estimated Creatinine Clearance: 56 mL/min (by C-G formula based on SCr of 0.54 mg/dL). Liver Function Tests: No results for input(s): "AST", "ALT", "ALKPHOS", "BILITOT", "PROT", "ALBUMIN" in the last 168 hours. Cardiac Enzymes: No results for input(s): "CKTOTAL", "CKMB", "CKMBINDEX", "TROPONINI" in the last 168 hours. BNP (last 3 results) No results for input(s): "PROBNP" in the last 8760 hours. HbA1C: No results for input(s): "HGBA1C" in the last 72 hours. Sepsis Labs: '@LABRCNTIP'$ (procalcitonin:4,lacticidven:4) ) Recent Results (from the past 240 hour(s))  SARS Coronavirus 2 by RT PCR (hospital order, performed in [facility] hospital lab) *cepheid single result test* Anterior Nasal Swab     Status: None   Collection Time: 06/28/22 12:11 PM   Specimen: Anterior Nasal Swab  Result Value Ref Range Status   SARS Coronavirus 2 by RT PCR NEGATIVE NEGATIVE Final    Comment: (NOTE) SARS-CoV-2 target nucleic acids are NOT DETECTED.  The SARS-CoV-2 RNA is generally detectable in upper and lower respiratory  specimens during the acute phase of infection. The lowest concentration of SARS-CoV-2 viral copies this assay can detect is 250 copies / mL. A negative result does not preclude SARS-CoV-2 infection and should not be used as the sole basis for treatment or other patient management decisions.  A negative result may occur with improper specimen collection / handling, submission of specimen other than nasopharyngeal swab, presence of viral mutation(s) within the areas targeted by this assay, and inadequate number of viral copies (<250 copies / mL). A negative result must be combined with clinical observations, patient history, and epidemiological information.  Fact Sheet  for Patients:   https://www.patel.info/  Fact Sheet for Healthcare Providers: https://hall.com/  This test is not yet approved or  cleared by the [place] FDA and has been authorized for detection and/or diagnosis of SARS-CoV-2 by FDA under an Emergency Use Authorization (EUA).  This EUA will remain in effect (meaning this test can be used) for the duration of the COVID-19 declaration under Section 564(b)(1) of the Act, 21 U.S.C. section 360bbb-3(b)(1), unless the authorization is terminated or revoked sooner.  Performed at [facility], [address], [place], [place] [address]       Radiology Studies: CT Angio Chest PE W/Cm &/Or Wo Cm  Result Date: 06/28/2022 CLINICAL DATA:  Shortness of breath EXAM: CT ANGIOGRAPHY CHEST WITH CONTRAST TECHNIQUE: Multidetector CT imaging of the chest was performed using the standard protocol during bolus administration of intravenous contrast. Multiplanar CT image reconstructions and MIPs were obtained to evaluate the vascular anatomy. RADIATION DOSE REDUCTION: This exam was performed according to the departmental dose-optimization program which includes automated exposure control, adjustment of the mA and/or kV according to patient size  and/or use of iterative reconstruction technique. CONTRAST:  47m OMNIPAQUE IOHEXOL 350 MG/ML SOLN COMPARISON:  05/03/2022 FINDINGS: Cardiovascular: Satisfactory opacification of the pulmonary arteries to the segmental level. No evidence of pulmonary embolism. Normal heart size. No pericardial effusion. Moderate coronary artery calcifications. Aortic atherosclerosis. Mediastinum/Nodes: Redemonstrated moderate to severe circumferential wall thickening in the lower esophagus, similar to the prior exam. New moderate wall thickening in the upper and mid esophagus. Fluid in the lower esophagus, which increases the risk of aspiration. No enlarged mediastinal, hilar, or axillary lymph nodes. The trachea is patent. Lungs/Pleura: Redemonstrated bronchiectasis of the left lower lobe and lingula with clustered centrilobular nodules, slightly improved in the lingula, compared to the prior exam. Unchanged linear opacities in the bilateral upper lobes, likely scarring. No pleural effusion or pneumothorax. Apical predominant centrilobular and paraseptal emphysema. Upper Abdomen: No acute abnormality. Musculoskeletal: S shaped curvature of the thoracolumbar spine. No acute osseous abnormality. No chest wall abnormality. Review of the MIP images confirms the above findings. IMPRESSION: 1. Negative for pulmonary embolism. 2. Moderate circumferential wall thickening in the proximal esophagus, which is new compared to 05/03/2022, with redemonstrated moderate to severe thickening of the distal esophagus, likely representing esophagitis. Endoscopy is again recommended for further evaluation. In addition, there is fluid in the lower esophagus, which increases the risk of aspiration. 3. Redemonstrated bronchiectasis in the lingula and left lower lobe with clustered centrilobular nodules, which have improved slightly in the lingula but remain concerning for chronic atypical infection or recurrent aspiration. 4. Aortic Atherosclerosis  (ICD10-I70.0) and Emphysema (ICD10-J43.9). Electronically Signed   By: AMerilyn BabaM.D.   On: 06/28/2022 14:15   DG Chest Portable 1 View  Result Date: 06/28/2022 CLINICAL DATA:  Chest pain for 3 weeks. History of COPD. Patient on home oxygen. EXAM: PORTABLE CHEST 1 VIEW COMPARISON:  Chest radiograph, 05/03/2022 and prior studies. Chest CT, 05/03/2022. FINDINGS: Cardiac silhouette normal in size. No mediastinal or hilar masses. Stable left anterior chest wall Port-A-Cath. Stable areas of lung scarring and interstitial thickening. No lung consolidation. No evidence of edema. Relative volume loss on the left. No pleural effusion or pneumothorax. Skeletal structures are grossly intact. IMPRESSION: 1. No acute cardiopulmonary disease. 2. Chronic lung changes stable from the prior study. Electronically Signed   By: DLajean ManesM.D.   On: 06/28/2022 11:44     Scheduled Meds:  amLODipine  5 mg Oral QHS  aspirin EC  81 mg Oral Daily   atorvastatin  40 mg Oral QHS   Chlorhexidine Gluconate Cloth  6 each Topical Daily   fluticasone furoate-vilanterol  1 puff Inhalation Daily   And   umeclidinium bromide  1 puff Inhalation Daily   levofloxacin  500 mg Oral Q24H   magnesium oxide  400 mg Oral BID   Niraparib Tosylate  1 tablet Oral Daily   nortriptyline  25 mg Oral QHS   pantoprazole (PROTONIX) IV  40 mg Intravenous Q12H   predniSONE  5 mg Oral Q breakfast   primidone  50 mg Oral QHS   sucralfate  1 g Oral TID WC & HS   Continuous Infusions:  lactated ringers Stopped (06/29/22 1219)     LOS: 0 days    Roxan Hockey M.D on 06/29/2022 at 6:43 PM  Go to [email or web address] - for contact info  [facility] - Office  [phone]  If 7PM-7AM, please contact night-coverage [email or web address] 06/29/2022, 6:43 PM

## 2022-06-29 NOTE — H&P (View-Only) (Signed)
Consulting  Provider: Dr. Nehemiah Settle Primary Care Physician:  [name], MD Primary Gastroenterologist:  Dr. Jenetta Downer  Reason for Consultation: Coffee-ground emesis, upper GI bleeding  HPI:  [name] is a 66 y.o. female with a past medical history of stage IV ovarian cancer, known peritoneal carcinomatosis, O2 dependent chronic respiratory failure due to COPD, who presented to [facility], ER yesterday due to elevated D-dimer, pseudomonal pneumonia.    CT angiogram which I personally reviewed, negative for PE though did show extensive esophagitis including proximal esophagus which is new, fluid in the distal portion of her esophagus.  Patient notes multiple episodes of vomiting.  Describes it as coffee-ground.  Similar presentation June 2023 with EGD showing significant esophagitis distally.  States she has been taking her pantoprazole as prescribed twice daily.  States she uses Aleve 1-2 times per month.  Initial hemoglobin 9 which is dropped to 6.7 this morning.  PRBCs to be given today.  Has been started on IV Protonix.  Eating breakfast during my interview and exam, clear liquids.  No vomiting since yesterday.  Past Medical History:  Diagnosis Date   Anemia    Aortic atherosclerosis (HCC)    Arthritis    Asthma    Bilateral carotid artery stenosis    Cancer (HCC)    Gastric Cancer   COPD (chronic obstructive pulmonary disease) (HCC)    High cholesterol    Hyperlipidemia    Hypertension    Neuropathy    Osteoarthritis    Ovarian cancer ([place])    Oxygen dependent    Peripheral vascular disease ([place])    Peritoneal carcinoma ([place])    Pneumonia 2019   Port-A-Cath in place 07/03/2020   Pulmonary emphysema ([place])     Past Surgical History:  Procedure Laterality Date   ESOPHAGOGASTRODUODENOSCOPY (EGD) WITH PROPOFOL N/A 02/17/2021   Procedure: ESOPHAGOGASTRODUODENOSCOPY (EGD) WITH PROPOFOL;  Surgeon: Harvel Quale, MD;  [facility];  Service:  Gastroenterology;  Laterality: N/A;   ESOPHAGOGASTRODUODENOSCOPY (EGD) WITH PROPOFOL N/A 05/04/2022   Procedure: ESOPHAGOGASTRODUODENOSCOPY (EGD) WITH PROPOFOL;  Surgeon: Daneil Dolin, MD;  Location: [facility];  Service: Endoscopy;  Laterality: N/A;   HALLUX VALGUS BASE WEDGE Right 06/09/2015   Procedure: Base wedge osteotomy with modified McBride right foot ;  Surgeon: Sharlotte Alamo, MD;  Location: [facility] ORS;  Service: Podiatry;  Laterality: Right;   PORTACATH PLACEMENT Left 06/28/2020   Procedure: PORT-A-CATHETER PLACEMENT LEFT CHEST (attached catheter in left subclavian);  Surgeon: Virl Cagey, MD;  Location: [facility];  Service: General;  Laterality: Left;   TUBAL LIGATION     VIDEO BRONCHOSCOPY WITH ENDOBRONCHIAL NAVIGATION N/A 03/09/2021   Procedure: VIDEO BRONCHOSCOPY WITH ENDOBRONCHIAL NAVIGATION;  Surgeon: Ottie Glazier, MD;  Location: [facility] ORS;  Service: Thoracic;  Laterality: N/A;   VIDEO BRONCHOSCOPY WITH ENDOBRONCHIAL ULTRASOUND N/A 03/09/2021   Procedure: VIDEO BRONCHOSCOPY WITH ENDOBRONCHIAL ULTRASOUND;  Surgeon: Ottie Glazier, MD;  Location: [facility] ORS;  Service: Thoracic;  Laterality: N/A;    Prior to Admission medications   Medication Sig Start Date End Date Taking? Authorizing Provider  Acetylcysteine (MUCOMYST-10 IN) Inhale 1 vial into the lungs every three (3) days as needed (copd).   Yes [provider]  albuterol (VENTOLIN HFA) 108 (90 Base) MCG/ACT inhaler Inhale 2 puffs into the lungs every 6 (six) hours as needed for wheezing or shortness of breath.   Yes [provider]  amLODipine (NORVASC) 5 MG tablet Take 5 mg by mouth at bedtime.  Yes [provider]  aspirin EC 81 MG tablet Take 81 mg by mouth daily.   Yes [provider]  atorvastatin (LIPITOR) 40 MG tablet Take 1 tablet (40 mg total) by mouth daily. 01/02/22  Yes Dessa Phi, DO  ferrous sulfate 325 (65 FE) MG tablet Take 325 mg by mouth every other day.   Yes  [provider]  Fluticasone-Umeclidin-Vilant (TRELEGY ELLIPTA) 100-62.5-25 MCG/ACT AEPB Inhale into the lungs. 06/18/21  Yes [provider]  HYDROcodone-acetaminophen (NORCO/VICODIN) 5-325 MG tablet Take 1 tablet by mouth 4 (four) times daily as needed for pain. 06/27/22  Yes [provider]  ipratropium (ATROVENT HFA) 17 MCG/ACT inhaler Inhale 2 puffs into the lungs every 6 (six) hours as needed for wheezing. 03/13/22  Yes Johnson, Clanford L, MD  levalbuterol (XOPENEX) 1.25 MG/3ML nebulizer solution Inhale into the lungs. 09/28/21  Yes [provider]  magnesium oxide (MAG-OX) 400 (240 Mg) MG tablet Take 1 tablet by mouth twice daily Patient taking differently: Take 400 mg by mouth 2 (two) times daily. 03/04/22  Yes Derek Jack, MD  Niraparib Tosylate (ZEJULA) 200 MG TABS Take 1 tablet by mouth daily. 06/25/22  Yes Derek Jack, MD  nortriptyline (PAMELOR) 25 MG capsule Take 25 mg by mouth at bedtime.   Yes [provider]  OXYGEN Inhale 3-4 L into the lungs continuous.   Yes [provider]  pantoprazole (PROTONIX) 40 MG tablet Take 1 tablet (40 mg total) by mouth 2 (two) times daily. 05/05/22 08/03/22 Yes Shah, Pratik D, DO  predniSONE (DELTASONE) 5 MG tablet Take 5 mg by mouth daily with breakfast.   Yes [provider]  primidone (MYSOLINE) 50 MG tablet Take 50 mg by mouth at bedtime. 01/30/22  Yes [provider]  levofloxacin (LEVAQUIN) 500 MG tablet Take 500 mg by mouth daily. 06/27/22   [provider]  sucralfate (CARAFATE) 1 GM/10ML suspension Take 10 mLs (1 g total) by mouth 4 (four) times daily -  with meals and at bedtime for 14 days. Patient not taking: Reported on 06/28/2022 05/05/22 05/19/22  Heath Lark D, DO    Current Facility-Administered Medications  Medication Dose Route Frequency Provider Last Rate Last Admin   albuterol (PROVENTIL) (2.5 MG/3ML) 0.083% nebulizer solution 2.5 mg  2.5 mg  Nebulization Q6H PRN Truett Mainland, DO       amLODipine (NORVASC) tablet 5 mg  5 mg Oral QHS Truett Mainland, DO   5 mg at 06/28/22 2118   aspirin EC tablet 81 mg  81 mg Oral Daily Truett Mainland, DO   81 mg at 06/29/22 8563   atorvastatin (LIPITOR) tablet 40 mg  40 mg Oral QHS Truett Mainland, DO   40 mg at 06/28/22 2118   Chlorhexidine Gluconate Cloth 2 % PADS 6 each  6 each Topical Daily Truett Mainland, DO   6 each at 06/29/22 0844   fluticasone furoate-vilanterol (BREO ELLIPTA) 100-25 MCG/ACT 1 puff  1 puff Inhalation Daily Truett Mainland, DO   1 puff at 06/29/22 0855   And   umeclidinium bromide (INCRUSE ELLIPTA) 62.5 MCG/ACT 1 puff  1 puff Inhalation Daily Truett Mainland, DO   1 puff at 06/29/22 0855   furosemide (LASIX) injection 20 mg  20 mg Intravenous Once Roxan Hockey, MD       HYDROcodone-acetaminophen (NORCO/VICODIN) 5-325 MG per tablet 1 tablet  1 tablet Oral QID PRN Truett Mainland, DO   1 tablet at 06/29/22  2505   ipratropium-albuterol (DUONEB) 0.5-2.5 (3) MG/3ML nebulizer solution 3 mL  3 mL Nebulization Q6H PRN Truett Mainland, DO       lactated ringers infusion   Intravenous Continuous Truett Mainland, DO 125 mL/hr at 06/29/22 0301 New Bag at 06/29/22 0301   levofloxacin (LEVAQUIN) tablet 500 mg  500 mg Oral Q24H Truett Mainland, DO   500 mg at 06/28/22 1935   magnesium oxide (MAG-OX) tablet 400 mg  400 mg Oral BID Truett Mainland, DO   400 mg at 06/29/22 3976   Niraparib Tosylate TABS 1 tablet  1 tablet Oral Daily Truett Mainland, DO       nortriptyline (PAMELOR) capsule 25 mg  25 mg Oral QHS Stinson, Jacob J, DO       ondansetron [facility]) tablet 4 mg  4 mg Oral Q6H PRN Truett Mainland, DO       Or   ondansetron [facility]) injection 4 mg  4 mg Intravenous Q6H PRN Truett Mainland, DO   4 mg at 06/28/22 1939   pantoprazole (PROTONIX) injection 40 mg  40 mg Intravenous Q12H Truett Mainland, DO   40 mg at 06/29/22 7341   potassium chloride SA (KLOR-CON M)  CR tablet 40 mEq  40 mEq Oral Q3H Emokpae, Courage, MD   40 mEq at 06/29/22 0842   predniSONE (DELTASONE) tablet 5 mg  5 mg Oral Q breakfast Truett Mainland, DO   5 mg at 06/29/22 9379   primidone (MYSOLINE) tablet 50 mg  50 mg Oral QHS Truett Mainland, DO   50 mg at 06/28/22 2118   sucralfate (CARAFATE) 1 GM/10ML suspension 1 g  1 g Oral TID WC & HS Truett Mainland, DO   1 g at 06/29/22 0820    Allergies as of 06/28/2022   (No Known Allergies)    Family History  Problem Relation Age of Onset   Alzheimer's disease Mother    COPD Father    Emphysema Father    Hypertension Father    Healthy Sister    Healthy Brother    Alzheimer's disease Maternal Grandmother    Healthy Sister    Healthy Sister    Healthy Sister    Prostate cancer Other        paternal grandmother's brother; dx in early 39s   Breast cancer Neg Hx     Social History   Socioeconomic History   Marital status: Divorced    Spouse name: Not on file   Number of children: 3   Years of education: Not on file   Highest education level: Not on file  Occupational History   Occupation: DISABLED  Tobacco Use   Smoking status: Former    Packs/day: 2.00    Years: 30.00    Total pack years: 60.00    Types: Cigarettes    Quit date: 11/04/2013    Years since quitting: 8.6   Smokeless tobacco: Never  Vaping Use   Vaping Use: Never used  Substance and Sexual Activity   Alcohol use: No   Drug use: No   Sexual activity: Not Currently  Other Topics Concern   Not on file  Social History Narrative   Not on file   Social Determinants of Health   Financial Resource Strain: Low Risk  (09/27/2020)   Overall Financial Resource Strain (CARDIA)    Difficulty of Paying Living Expenses: Not hard at all  Food Insecurity: No Food Insecurity (09/27/2020)  Hunger Vital Sign    Worried About Running Out of Food in the Last Year: Never true    Ran Out of Food in the Last Year: Never true  Transportation Needs: No  Transportation Needs (09/27/2020)   PRAPARE - Hydrologist (Medical): No    Lack of Transportation (Non-Medical): No  Physical Activity: Inactive (09/27/2020)   Exercise Vital Sign    Days of Exercise per Week: 0 days    Minutes of Exercise per Session: 0 min  Stress: No Stress Concern Present (09/27/2020)   [place]    Feeling of Stress : Not at all  Social Connections: Socially Isolated (09/27/2020)   Social Connection and Isolation Panel [NHANES]    Frequency of Communication with Friends and Family: More than three times a week    Frequency of Social Gatherings with Friends and Family: Never    Attends Religious Services: Never    Marine scientist or Organizations: No    Attends Archivist Meetings: Never    Marital Status: Divorced  Human resources officer Violence: Not At Risk (09/27/2020)   Humiliation, Afraid, Rape, and Kick questionnaire    Fear of Current or Ex-Partner: No    Emotionally Abused: No    Physically Abused: No    Sexually Abused: No    Review of Systems: General: Negative for anorexia, weight loss, fever, chills, fatigue, weakness. Eyes: Negative for vision changes.  ENT: Negative for hoarseness, difficulty swallowing , nasal congestion. CV: Negative for chest pain, angina, palpitations, dyspnea on exertion, peripheral edema.  Respiratory: Negative for dyspnea at rest, dyspnea on exertion, cough, sputum, wheezing.  GI: See history of present illness. GU:  Negative for dysuria, hematuria, urinary incontinence, urinary frequency, nocturnal urination.  MS: Negative for joint pain, low back pain.  Derm: Negative for rash or itching.  Neuro: Negative for weakness, abnormal sensation, seizure, frequent headaches, memory loss, confusion.  Psych: Negative for anxiety, depression Endo: Negative for unusual weight change.  Heme: Negative for bruising or  bleeding. Allergy: Negative for rash or hives.  Physical Exam: Vital signs in last 24 hours: Temp:  [97.5 F (36.4 C)-98.5 F (36.9 C)] 98.5 F (36.9 C) (08/12 0432) Pulse Rate:  [78-99] 87 (08/12 0432) Resp:  [14-20] 14 (08/12 0432) BP: (99-157)/(68-133) 157/133 (08/12 0432) SpO2:  [98 %-100 %] 98 % (08/12 0857) Weight:  [55.1 kg-56.2 kg] 55.1 kg (08/11 1800) Last BM Date : 06/27/22 General:   Alert,  Well-developed, well-nourished, pleasant and cooperative in NAD Head:  Normocephalic and atraumatic. Eyes:  Sclera clear, no icterus.   Conjunctiva pink. Ears:  Normal auditory acuity. Nose:  No deformity, discharge,  or lesions. Mouth:  No deformity or lesions, dentition normal. Neck:  Supple; no masses or thyromegaly. Lungs:  Clear throughout to auscultation.   No wheezes, crackles, or rhonchi. No acute distress. Heart:  Regular rate and rhythm; no murmurs, clicks, rubs,  or gallops. Abdomen:  Soft, nontender and nondistended. No masses, hepatosplenomegaly or hernias noted. Normal bowel sounds, without guarding, and without rebound.   Msk:  Symmetrical without gross deformities. Normal posture. Pulses:  Normal pulses noted. Extremities:  Without clubbing or edema. Neurologic:  Alert and  oriented x4;  grossly normal neurologically. Skin:  Intact without significant lesions or rashes. Cervical Nodes:  No significant cervical adenopathy. Psych:  Alert and cooperative. Normal mood and affect.  Intake/Output from previous day: 08/11 0701 - 08/12 0700  In: 390 [P.O.:240; IV Piggyback:150] Out: -  Intake/Output this shift: No intake/output data recorded.  Lab Results: Recent Labs    06/28/22 1202 06/29/22 0500  WBC 8.8 4.9  HGB 9.0* 6.7*  HCT 28.0* 21.2*  PLT 123* 92*   BMET Recent Labs    06/28/22 1202 06/29/22 0500  NA 136 135  K 3.4* 3.3*  CL 91* 98  CO2 36* 31  GLUCOSE 117* 87  BUN 29* 11  CREATININE 0.69 0.54  CALCIUM 8.6* 7.6*   LFT No results for  input(s): "PROT", "ALBUMIN", "AST", "ALT", "ALKPHOS", "BILITOT", "BILIDIR", "IBILI" in the last 72 hours. PT/INR Recent Labs    06/28/22 1202  LABPROT 12.1  INR 0.9   Hepatitis Panel No results for input(s): "HEPBSAG", "HCVAB", "HEPAIGM", "HEPBIGM" in the last 72 hours. C-Diff No results for input(s): "CDIFFTOX" in the last 72 hours.  Studies/Results: CT Angio Chest PE W/Cm &/Or Wo Cm  Result Date: 06/28/2022 CLINICAL DATA:  Shortness of breath EXAM: CT ANGIOGRAPHY CHEST WITH CONTRAST TECHNIQUE: Multidetector CT imaging of the chest was performed using the standard protocol during bolus administration of intravenous contrast. Multiplanar CT image reconstructions and MIPs were obtained to evaluate the vascular anatomy. RADIATION DOSE REDUCTION: This exam was performed according to the departmental dose-optimization program which includes automated exposure control, adjustment of the mA and/or kV according to patient size and/or use of iterative reconstruction technique. CONTRAST:  39m OMNIPAQUE IOHEXOL 350 MG/ML SOLN COMPARISON:  05/03/2022 FINDINGS: Cardiovascular: Satisfactory opacification of the pulmonary arteries to the segmental level. No evidence of pulmonary embolism. Normal heart size. No pericardial effusion. Moderate coronary artery calcifications. Aortic atherosclerosis. Mediastinum/Nodes: Redemonstrated moderate to severe circumferential wall thickening in the lower esophagus, similar to the prior exam. New moderate wall thickening in the upper and mid esophagus. Fluid in the lower esophagus, which increases the risk of aspiration. No enlarged mediastinal, hilar, or axillary lymph nodes. The trachea is patent. Lungs/Pleura: Redemonstrated bronchiectasis of the left lower lobe and lingula with clustered centrilobular nodules, slightly improved in the lingula, compared to the prior exam. Unchanged linear opacities in the bilateral upper lobes, likely scarring. No pleural effusion or  pneumothorax. Apical predominant centrilobular and paraseptal emphysema. Upper Abdomen: No acute abnormality. Musculoskeletal: S shaped curvature of the thoracolumbar spine. No acute osseous abnormality. No chest wall abnormality. Review of the MIP images confirms the above findings. IMPRESSION: 1. Negative for pulmonary embolism. 2. Moderate circumferential wall thickening in the proximal esophagus, which is new compared to 05/03/2022, with redemonstrated moderate to severe thickening of the distal esophagus, likely representing esophagitis. Endoscopy is again recommended for further evaluation. In addition, there is fluid in the lower esophagus, which increases the risk of aspiration. 3. Redemonstrated bronchiectasis in the lingula and left lower lobe with clustered centrilobular nodules, which have improved slightly in the lingula but remain concerning for chronic atypical infection or recurrent aspiration. 4. Aortic Atherosclerosis (ICD10-I70.0) and Emphysema (ICD10-J43.9). Electronically Signed   By: AMerilyn BabaM.D.   On: 06/28/2022 14:15   DG Chest Portable 1 View  Result Date: 06/28/2022 CLINICAL DATA:  Chest pain for 3 weeks. History of COPD. Patient on home oxygen. EXAM: PORTABLE CHEST 1 VIEW COMPARISON:  Chest radiograph, 05/03/2022 and prior studies. Chest CT, 05/03/2022. FINDINGS: Cardiac silhouette normal in size. No mediastinal or hilar masses. Stable left anterior chest wall Port-A-Cath. Stable areas of lung scarring and interstitial thickening. No lung consolidation. No evidence of edema. Relative volume loss on the left. No pleural effusion or  pneumothorax. Skeletal structures are grossly intact. IMPRESSION: 1. No acute cardiopulmonary disease. 2. Chronic lung changes stable from the prior study. Electronically Signed   By: Lajean Manes M.D.   On: 06/28/2022 11:44    Impression: *Upper GI bleeding *Acute blood loss anemia due to above *Esophagitis  Plan: Etiology of patient's  coffee-ground emesis/upper GI bleeding likely due to severe esophagitis which appears to be worsening based on CT imaging.  Needs resuscitation with PRBCs today.  Continue to monitor H&H and transfuse for less than 7.  Agree with IV Protonix 40 mg twice daily.  Avoid NSAIDs.  We will tentatively plan on EGD tomorrow morning to further evaluate. The risks including infection, bleed, or perforation as well as benefits, limitations, alternatives and imponderables have been reviewed with the patient. Potential for esophageal dilation, biopsy, etc. have also been reviewed.  Questions have been answered. All parties agreeable.  Clear liquids today.  N.p.o. after midnight.  Thank you for the consultation  Elon Alas. Abbey Chatters, D.O. Gastroenterology and Hepatology [facility]    LOS: 0 days     06/29/2022, 10:51 AM

## 2022-06-30 ENCOUNTER — Encounter (HOSPITAL_COMMUNITY)
Admission: EM | Disposition: A | Discharge status: Home / Self Care | Payer: Self-pay | Source: Home / Self Care | Attending: Emergency Medicine

## 2022-06-30 ENCOUNTER — Telehealth: Payer: Self-pay | Admitting: Internal Medicine

## 2022-06-30 ENCOUNTER — Observation Stay (HOSPITAL_COMMUNITY): Payer: Medicare HMO | Admitting: Anesthesiology

## 2022-06-30 DIAGNOSIS — K222 Esophageal obstruction: Secondary | ICD-10-CM

## 2022-06-30 DIAGNOSIS — K209 Esophagitis, unspecified without bleeding: Secondary | ICD-10-CM | POA: Diagnosis not present

## 2022-06-30 DIAGNOSIS — K2970 Gastritis, unspecified, without bleeding: Secondary | ICD-10-CM | POA: Diagnosis not present

## 2022-06-30 HISTORY — PX: ESOPHAGOGASTRODUODENOSCOPY (EGD) WITH PROPOFOL: SHX5813

## 2022-06-30 HISTORY — PX: BIOPSY: SHX5522

## 2022-06-30 LAB — CBC
HCT: 31.9 % — ABNORMAL LOW (ref 36.0–46.0)
Hemoglobin: 10.8 g/dL — ABNORMAL LOW (ref 12.0–15.0)
MCH: 31.5 pg (ref 26.0–34.0)
MCHC: 33.9 g/dL (ref 30.0–36.0)
MCV: 93 fL (ref 80.0–100.0)
Platelets: 79 10*3/uL — ABNORMAL LOW (ref 150–400)
RBC: 3.43 MIL/uL — ABNORMAL LOW (ref 3.87–5.11)
RDW: 17.4 % — ABNORMAL HIGH (ref 11.5–15.5)
WBC: 5.6 10*3/uL (ref 4.0–10.5)
nRBC: 0 % (ref 0.0–0.2)

## 2022-06-30 LAB — BASIC METABOLIC PANEL
Anion gap: 7 (ref 5–15)
BUN: <5 mg/dL — ABNORMAL LOW (ref 8–23)
CO2: 32 mmol/L (ref 22–32)
Calcium: 8.2 mg/dL — ABNORMAL LOW (ref 8.9–10.3)
Chloride: 98 mmol/L (ref 98–111)
Creatinine, Ser: 0.44 mg/dL (ref 0.44–1.00)
GFR, Estimated: >60 mL/min (ref >60)
Glucose, Bld: 90 mg/dL (ref 70–99)
Potassium: 3.4 mmol/L — ABNORMAL LOW (ref 3.5–5.1)
Sodium: 137 mmol/L (ref 135–145)

## 2022-06-30 LAB — KOH PREP: KOH Prep: NONE SEEN

## 2022-06-30 SURGERY — ESOPHAGOGASTRODUODENOSCOPY (EGD) WITH PROPOFOL
Anesthesia: General

## 2022-06-30 MED ORDER — PROPOFOL 10 MG/ML IV BOLUS
INTRAVENOUS | Status: DC | PRN
  Administered 2022-06-30: 50 mg via INTRAVENOUS

## 2022-06-30 MED ORDER — LIDOCAINE HCL (PF) 2 % IJ SOLN
INTRAMUSCULAR | Status: AC
  Filled 2022-06-30: qty 5

## 2022-06-30 MED ORDER — PHENYLEPHRINE 80 MCG/ML (10ML) SYRINGE FOR IV PUSH (FOR BLOOD PRESSURE SUPPORT)
PREFILLED_SYRINGE | INTRAVENOUS | Status: DC | PRN
  Administered 2022-06-30 (×4): 80 ug via INTRAVENOUS

## 2022-06-30 MED ORDER — PROPOFOL 10 MG/ML IV BOLUS
INTRAVENOUS | Status: AC
  Filled 2022-06-30: qty 40

## 2022-06-30 MED ORDER — SODIUM CHLORIDE FLUSH 0.9 % IV SOLN
INTRAVENOUS | Status: AC
  Filled 2022-06-30: qty 10

## 2022-06-30 MED ORDER — LACTATED RINGERS IV SOLN: INTRAVENOUS | Status: DC | PRN

## 2022-06-30 MED ORDER — POTASSIUM CHLORIDE CRYS ER 20 MEQ PO TBCR
40.0000 meq | EXTENDED_RELEASE_TABLET | ORAL | Status: AC
  Administered 2022-06-30: 40 meq via ORAL
  Filled 2022-06-30 (×2): qty 2

## 2022-06-30 MED ORDER — PROPOFOL 500 MG/50ML IV EMUL: INTRAVENOUS | Status: DC | PRN

## 2022-06-30 MED ORDER — PHENYLEPHRINE 80 MCG/ML (10ML) SYRINGE FOR IV PUSH (FOR BLOOD PRESSURE SUPPORT)
PREFILLED_SYRINGE | INTRAVENOUS | Status: AC
  Filled 2022-06-30: qty 10

## 2022-06-30 MED ORDER — LIDOCAINE VISCOUS HCL 2 % MT SOLN
OROMUCOSAL | Status: AC
  Filled 2022-06-30: qty 15

## 2022-06-30 MED ORDER — PROPOFOL 500 MG/50ML IV EMUL
INTRAVENOUS | Status: DC | PRN
  Administered 2022-06-30: 150 ug/kg/min via INTRAVENOUS

## 2022-06-30 NOTE — Op Note (Addendum)
[facility] Patient Name: [name] Procedure Date: 06/30/2022 8:18 AM MRN: [id number] Date of Birth: 10-23-1956 Attending MD: Elon Alas. Abbey Chatters DO CSN: [id number] Age: 66 Admit Type: Inpatient Procedure:                Upper GI endoscopy Indications:              Acute post hemorrhagic anemia, Coffee-ground                            emesis, Abnormal CT of the GI tract Providers:                Elon Alas. Abbey Chatters, DO, Hughie Closs RN, RN, Hinton Rao RN, RN Referring MD:              Medicines:                See the Anesthesia note for documentation of the                            administered medications Complications:            No immediate complications. Estimated Blood Loss:     Estimated blood loss was minimal. Procedure:                Pre-Anesthesia Assessment:                           - The anesthesia plan was to use monitored                            anesthesia care (MAC).                           After obtaining informed consent, the endoscope was                            passed under direct vision. Throughout the                            procedure, the patient's blood pressure, pulse, and                            oxygen saturations were monitored continuously. The                            GIF-H190 ([id number]) scope was introduced through the                            mouth, and advanced to the second part of duodenum.                            The upper GI endoscopy was accomplished without  difficulty. The patient tolerated the procedure                            well. Scope In: 8:55:17 AM Scope Out: 8:59:40 AM Total Procedure Duration: 0 hours 4 minutes 23 seconds  Findings:      LA Grade D (one or more mucosal breaks involving at least 75% of       esophageal circumference) esophagitis with no bleeding was found 25 to       31 cm from the incisors. Cells for cytology were obtained by  brushing.      A small hiatal hernia was present.      A moderate Schatzki ring was found in the distal esophagus.      Localized moderate inflammation characterized by erosions, erythema and       shallow ulcerations was found in the gastric body. Biopsies were taken       with a cold forceps for Helicobacter pylori testing.      The duodenal bulb, first portion of the duodenum and second portion of       the duodenum were normal. Impression:               - LA Grade D esophagitis with no bleeding. Cells                            for cytology obtained.                           - Small hiatal hernia.                           - Moderate Schatzki ring.                           - Gastritis. Biopsied.                           - Normal duodenal bulb, first portion of the                            duodenum and second portion of the duodenum. Moderate Sedation:      Per Anesthesia Care Recommendation:           - Return patient to hospital ward for ongoing care.                           - Soft diet.                           - Continue on IV PPI BID while inpatient.                            Transition to PO 12m BID upon discharge. Avoid all                            NSAIDs. Repeat EGD in 8-10 weeks with dilation once  esophagitis has healed as well as to biopsy to rule                            out Barretts. Await pathology results. Treat for H                            pylori if positive. Repeat CBC in 1 week. Follow up                            with GI in 3-4 weeks. Procedure Code(s):        --- Professional ---                           [phone], Esophagogastroduodenoscopy, flexible,                            transoral; with biopsy, single or multiple Diagnosis Code(s):        --- Professional ---                           K20.90, Esophagitis, unspecified without bleeding                           K44.9, Diaphragmatic hernia without obstruction or                             gangrene                           K22.2, Esophageal obstruction                           K29.70, Gastritis, unspecified, without bleeding                           D62, Acute posthemorrhagic anemia                           K92.0, Hematemesis                           R93.3, Abnormal findings on diagnostic imaging of                            other parts of digestive tract CPT copyright 2019 American Medical Association. All rights reserved. The codes documented in this report are preliminary and upon coder review may  be revised to meet current compliance requirements. Elon Alas. Abbey Chatters, DO [place] Abbey Chatters, DO 06/30/2022 9:12:11 AM This report has been signed electronically. Number of Addenda: 0

## 2022-06-30 NOTE — Anesthesia Postprocedure Evaluation (Signed)
Anesthesia Post Note  Patient: [name]  Procedure(s) Performed: ESOPHAGOGASTRODUODENOSCOPY (EGD) WITH PROPOFOL  Patient location during evaluation: PACU Anesthesia Type: General Level of consciousness: awake and alert and oriented Pain management: pain level controlled Vital Signs Assessment: post-procedure vital signs reviewed and stable Respiratory status: spontaneous breathing, nonlabored ventilation, respiratory function stable and patient connected to nasal cannula oxygen Cardiovascular status: blood pressure returned to baseline and stable Postop Assessment: no apparent nausea or vomiting Anesthetic complications: no   No notable events documented.   Last Vitals:  Vitals:   06/30/22 0915 06/30/22 0930  BP: (!) 89/62 109/63  Pulse:  75  Resp: 20 17  Temp: 36.7 C   SpO2: 99% 99%    Last Pain:  Vitals:   06/30/22 0915  TempSrc:   PainSc: 0-No pain                 Sung Parodi C Fredie Majano

## 2022-06-30 NOTE — Transfer of Care (Signed)
Immediate Anesthesia Transfer of Care Note  Patient: [name]  Procedure(s) Performed: ESOPHAGOGASTRODUODENOSCOPY (EGD) WITH PROPOFOL  Patient Location: PACU  Anesthesia Type:General  Level of Consciousness: awake, alert  and oriented  Airway & Oxygen Therapy: Patient Spontanous Breathing and Patient connected to nasal cannula oxygen  Post-op Assessment: Report given to RN and Post -op Vital signs reviewed and stable  Post vital signs: Reviewed and stable  Last Vitals:  Vitals Value Taken Time  BP 89/62 06/30/22 0916  Temp 98 06/30/22 0917  Pulse 76 06/30/22 0918  Resp 20 06/30/22 0918  SpO2 100 % 06/30/22 0918  Vitals shown include unvalidated device data.  Last Pain:  Vitals:   06/30/22 0452  TempSrc:   PainSc: Asleep         Complications: No notable events documented.

## 2022-06-30 NOTE — Telephone Encounter (Signed)
Please arrange hospital follow-up visit for this patient in 3 to 4 weeks.  Thank you!

## 2022-06-30 NOTE — Interval H&P Note (Signed)
History and Physical Interval Note:  06/30/2022 8:50 AM  [name]  has presented today for surgery, with the diagnosis of Upper GI bleed, acute blood loss anemia.  The various methods of treatment have been discussed with the patient and family. After consideration of risks, benefits and other options for treatment, the patient has consented to  Procedure(s): ESOPHAGOGASTRODUODENOSCOPY (EGD) WITH PROPOFOL (N/A) as a surgical intervention.  The patient's history has been reviewed, patient examined, no change in status, stable for surgery.  I have reviewed the patient's chart and labs.  Questions were answered to the patient's satisfaction.     Eloise Harman

## 2022-06-30 NOTE — Progress Notes (Signed)
PROGRESS NOTE     [name], is a 66 y.o. female, DOB - [birth year], KVQ:[id number]  Admit date - 06/28/2022   Admitting Physician Truett Mainland, DO  Outpatient Primary MD for the patient is Adamo, Hattie Perch, MD  LOS - 0  Chief Complaint  Patient presents with   Shortness of Breath        Brief Narrative:  66 y.o. 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  Schatzki's ring and small hiatal hernia.  Does have gastritis with 3-4 superficial ulcers , biopsies obtained -GI physician recommends twice daily IV Protonix for at least another 24 hours -Hgb is up to 10.8 from 6.7 after transfusion of 2 units of PRBC on 06/29/2022 Repeat CBC in a.m.  2) recent Pseudomonas pulmonary infection superimposed on COPD -Continue Levaquin -Continue bronchodilators  3) esophagitis and dysphagia--- iv Protonix and Carafate as ordered -EGD as above #1  4) chronic hypoxic respiratory failure--- secondary to COPD -Continue nasal cannula 3 L/min which is baseline for patient  5) ovarian malignancy with peritoneal carcinomatosis -Undergoing chemo -Outpatient follow-up with oncologist  6)HTN--continue amlodipine   Disposition/Need for in-Hospital Stay- patient unable to be discharged at this time due to --GI bleed requiring EGD and transfusion of PRBCs -Possible discharge home on 07/01/2022 if tolerating oral intake well and hemoglobin  stable  Disposition: The patient is from: Home              Anticipated d/c is to: Home              Anticipated d/c date is: 1 day              Patient currently is not medically stable to d/c. Barriers: Not Clinically Stable-   Code Status :  -  Code Status: Full Code   Family Communication:    NA (patient is alert, awake and coherent)   DVT Prophylaxis  :   - SCDs   SCDs Start: 06/28/22 1817   Lab Results  Component Value Date   PLT 79 (L) 06/30/2022    Inpatient Medications  Scheduled Meds:  amLODipine  5 mg Oral QHS   aspirin EC  81 mg Oral Daily   atorvastatin  40 mg Oral QHS   Chlorhexidine Gluconate Cloth  6 each Topical Daily   fluticasone furoate-vilanterol  1 puff Inhalation Daily   And   umeclidinium bromide  1 puff Inhalation Daily   levofloxacin  500 mg Oral Q24H   magnesium oxide  400 mg Oral BID   Niraparib Tosylate  1 tablet Oral Daily   nortriptyline  25 mg Oral QHS   pantoprazole (PROTONIX) IV  40 mg Intravenous Q12H   potassium chloride  40 mEq Oral Q3H   predniSONE  5 mg Oral Q breakfast   primidone  50 mg Oral QHS   sucralfate  1 g Oral TID WC & HS   Continuous Infusions:  lactated ringers 40 mL/hr at 06/29/22 1851   PRN Meds:.albuterol, HYDROcodone-acetaminophen, ipratropium-albuterol, ondansetron **OR** ondansetron (  ZOFRAN) IV   Anti-infectives (From admission, onward)    Start     Dose/Rate Route Frequency Ordered Stop   06/28/22 1830  levofloxacin (LEVAQUIN) tablet 500 mg        500 mg Oral Every 24 hours 06/28/22 1816     06/28/22 1545  ampicillin-sulbactam (UNASYN) 1.5 g in sodium chloride 0.9 % 100 mL IVPB  Status:  Discontinued        1.5 g 200 mL/hr over 30 Minutes Intravenous Every 6 hours 06/28/22 1535 06/28/22 1816         Subjective: [name] today has no fevers, no emesis,  No chest pain,   - -Tolerating liquids okay -Tolerated EGD well -By GI physician okay to advance diet to soft diet -GI physician request we  will continue IV Protonix  Objective: Vitals:   06/30/22 0915 06/30/22 0930 06/30/22 1049 06/30/22 1426  BP: (!) 89/62 109/63  129/69  Pulse:  75  90  Resp: '20 17  17  '$ Temp: 98 F (36.7 C)   98.5 F (36.9 C)  TempSrc:    Oral  SpO2: 99% 99% 95% 98%  Weight:      Height:        Intake/Output Summary (Last 24 hours) at 06/30/2022 1453 Last data filed at 06/30/2022 1300 Gross per 24 hour  Intake 2988.97 ml  Output 2700 ml  Net 288.97 ml   Filed Weights   06/28/22 1119 06/28/22 1800  Weight: 56.2 kg 55.1 kg   Physical Exam Gen:- Awake Alert,  in no apparent distress  HEENT:- [place].AT, No sclera icterus Nose- [place] 3L/min Neck-Supple Neck,No JVD,.  Lungs-fair air movement, no wheezing  CV- S1, S2 normal, regular , left subclavian Port-A-Cath Abd-  +ve B.Sounds, Abd Soft, No tenderness,    Extremity/Skin:- No  edema, pedal pulses present  Psych-affect is appropriate, oriented x3 Neuro-no new focal deficits, no tremors  Data Reviewed: I have personally reviewed following labs and imaging studies  CBC: Recent Labs  Lab 06/28/22 1202 06/29/22 0500 06/30/22 0521  WBC 8.8 4.9 5.6  NEUTROABS 6.4  --   --   HGB 9.0* 6.7* 10.8*  HCT 28.0* 21.2* 31.9*  MCV 98.2 100.0 93.0  PLT 123* 92* 79*   Basic Metabolic Panel: Recent Labs  Lab 06/28/22 1202 06/29/22 0500 06/30/22 0521  NA 136 135 137  K 3.4* 3.3* 3.4*  CL 91* 98 98  CO2 36* 31 32  GLUCOSE 117* 87 90  BUN 29* 11 <5*  CREATININE 0.69 0.54 0.44  CALCIUM 8.6* 7.6* 8.2*   GFR: Estimated Creatinine Clearance: 56 mL/min (by C-G formula based on SCr of 0.44 mg/dL). Liver Function Tests: No results for input(s): "AST", "ALT", "ALKPHOS", "BILITOT", "PROT", "ALBUMIN" in the last 168 hours. Cardiac Enzymes: No results for input(s): "CKTOTAL", "CKMB", "CKMBINDEX", "TROPONINI" in the last 168 hours. BNP (last 3 results) No results for input(s): "PROBNP" in the last 8760 hours. HbA1C: No results for input(s): "HGBA1C" in  the last 72 hours. Sepsis Labs: '@LABRCNTIP'$ (procalcitonin:4,lacticidven:4) ) Recent Results (from the past 240 hour(s))  SARS Coronavirus 2 by RT PCR (hospital order, performed in [facility] hospital lab) *cepheid single result test* Anterior Nasal Swab     Status: None   Collection Time: 06/28/22 12:11 PM   Specimen: Anterior Nasal Swab  Result Value Ref Range Status   SARS Coronavirus 2 by RT PCR NEGATIVE NEGATIVE Final    Comment: (NOTE) SARS-CoV-2 target nucleic acids are NOT DETECTED.  The SARS-CoV-2  RNA is generally detectable in upper and lower respiratory specimens during the acute phase of infection. The lowest concentration of SARS-CoV-2 viral copies this assay can detect is 250 copies / mL. A negative result does not preclude SARS-CoV-2 infection and should not be used as the sole basis for treatment or other patient management decisions.  A negative result may occur with improper specimen collection / handling, submission of specimen other than nasopharyngeal swab, presence of viral mutation(s) within the areas targeted by this assay, and inadequate number of viral copies (<250 copies / mL). A negative result must be combined with clinical observations, patient history, and epidemiological information.  Fact Sheet for Patients:   https://www.patel.info/  Fact Sheet for Healthcare Providers: https://hall.com/  This test is not yet approved or  cleared by the [place] FDA and has been authorized for detection and/or diagnosis of SARS-CoV-2 by FDA under an Emergency Use Authorization (EUA).  This EUA will remain in effect (meaning this test can be used) for the duration of the COVID-19 declaration under Section 564(b)(1) of the Act, 21 U.S.C. section 360bbb-3(b)(1), unless the authorization is terminated or revoked sooner.  Performed at [facility], [address] [place], [place] [address]   KOH prep     Status: None    Collection Time: 06/30/22  9:08 AM   Specimen: PATH GI Other; Tissue  Result Value Ref Range Status   Specimen Description ESOPHAGUS  Final   Special Requests NONE  Final   KOH Prep   Final    NO YEAST OR FUNGAL ELEMENTS SEEN Performed at [facility], [address], [place], [place] [address]    Report Status 06/30/2022 FINAL  Final     Radiology Studies: No results found.   Scheduled Meds:  amLODipine  5 mg Oral QHS   aspirin EC  81 mg Oral Daily   atorvastatin  40 mg Oral QHS   Chlorhexidine Gluconate Cloth  6 each Topical Daily   fluticasone furoate-vilanterol  1 puff Inhalation Daily   And   umeclidinium bromide  1 puff Inhalation Daily   levofloxacin  500 mg Oral Q24H   magnesium oxide  400 mg Oral BID   Niraparib Tosylate  1 tablet Oral Daily   nortriptyline  25 mg Oral QHS   pantoprazole (PROTONIX) IV  40 mg Intravenous Q12H   potassium chloride  40 mEq Oral Q3H   predniSONE  5 mg Oral Q breakfast   primidone  50 mg Oral QHS   sucralfate  1 g Oral TID WC & HS   Continuous Infusions:  lactated ringers 40 mL/hr at 06/29/22 1851    LOS: 0 days   Roxan Hockey M.D on 06/30/2022 at 2:53 PM  Go to [email or web address] - for contact info  [facility] - Office  [phone]  If 7PM-7AM, please contact night-coverage [email or web address] 06/30/2022, 2:53 PM

## 2022-06-30 NOTE — Anesthesia Preprocedure Evaluation (Addendum)
Anesthesia Evaluation  Patient identified by MRN, date of birth, ID band Patient awake    Reviewed: Allergy & Precautions, NPO status , Patient's Chart, lab work & pertinent test results  History of Anesthesia Complications Negative for: history of anesthetic complications  Airway Mallampati: II  TM Distance: >3 FB Neck ROM: Full    Dental  (+) Edentulous Upper, Edentulous Lower   Pulmonary shortness of breath, with exertion and Long-Term Oxygen Therapy, asthma , pneumonia, resolved, COPD,  COPD inhaler and oxygen dependent, former smoker,   On 4l of oxygen  Pulmonary exam normal breath sounds clear to auscultation       Cardiovascular Exercise Tolerance: Poor hypertension, Pt. on medications + Peripheral Vascular Disease  Normal cardiovascular exam Rhythm:Regular Rate:Normal  1. Left ventricular ejection fraction, by estimation, is 60 to 65%. The left ventricle has normal function. The left ventricle has no regional wall motion abnormalities. There is mild left ventricular hypertrophy.  Left ventricular diastolic parameters  are indeterminate.  2. Right ventricular systolic function is normal. The right ventricular size is normal. Tricuspid regurgitation signal is inadequate for assessing  PA pressure.  3. The mitral valve is grossly normal. Trivial mitral valve  regurgitation.  4. The aortic valve is tricuspid. There is mild calcification of the aortic valve. Aortic valve regurgitation is not visualized. Mild to moderate aortic valve sclerosis/calcification is present, without any  evidence of aortic stenosis.  5. The inferior vena cava is normal in size with greater than 50% respiratory variability, suggesting right atrial pressure of 3 mmHg   Neuro/Psych  Neuromuscular disease negative psych ROS   GI/Hepatic Neg liver ROS, GERD (gastric cancer)  Medicated,Upper GI bleed   Endo/Other  negative endocrine ROS   Renal/GU negative Renal ROS  Female GU complaint (ovarian cancer)     Musculoskeletal  (+) Arthritis , Osteoarthritis,    Abdominal   Peds  Hematology  (+) Blood dyscrasia (ovarian cancer), anemia ,   Anesthesia Other Findings IMPRESSION: 1. Negative for pulmonary embolism. 2. Moderate circumferential wall thickening in the proximal esophagus, which is new compared to 05/03/2022, with redemonstrated moderate to severe thickening of the distal esophagus, likely representing esophagitis. Endoscopy is again recommended for further evaluation. In addition, there is fluid in the lower esophagus, which increases the risk of aspiration. 3. Redemonstrated bronchiectasis in the lingula and left lower lobe with clustered centrilobular nodules, which have improved slightly in the lingula but remain concerning for chronic atypical infection or recurrent aspiration. 4. Aortic Atherosclerosis (ICD10-I70.0) and Emphysema (ICD10-J43.9).   Electronically Signed   By: Merilyn Baba M.D.   On: 06/28/2022 14:15  Reproductive/Obstetrics                           Anesthesia Physical  Anesthesia Plan  ASA: 4  Anesthesia Plan: General   Post-op Pain Management: Minimal or no pain anticipated   Induction: Intravenous  PONV Risk Score and Plan: Propofol infusion  Airway Management Planned: Nasal Cannula and Natural Airway  Additional Equipment:   Intra-op Plan:   Post-operative Plan: Possible Post-op intubation/ventilation  Informed Consent: I have reviewed the patients History and Physical, chart, labs and discussed the procedure including the risks, benefits and alternatives for the proposed anesthesia with the patient or authorized representative who has indicated his/her understanding and acceptance.       Plan Discussed with: Surgeon  Anesthesia Plan Comments:         Anesthesia Quick Evaluation

## 2022-07-01 ENCOUNTER — Other Ambulatory Visit (HOSPITAL_COMMUNITY): Payer: Self-pay

## 2022-07-01 DIAGNOSIS — K209 Esophagitis, unspecified without bleeding: Secondary | ICD-10-CM | POA: Diagnosis not present

## 2022-07-01 LAB — CBC
HCT: 33.6 % — ABNORMAL LOW (ref 36.0–46.0)
Hemoglobin: 11.2 g/dL — ABNORMAL LOW (ref 12.0–15.0)
MCH: 31.3 pg (ref 26.0–34.0)
MCHC: 33.3 g/dL (ref 30.0–36.0)
MCV: 93.9 fL (ref 80.0–100.0)
Platelets: 75 10*3/uL — ABNORMAL LOW (ref 150–400)
RBC: 3.58 MIL/uL — ABNORMAL LOW (ref 3.87–5.11)
RDW: 16 % — ABNORMAL HIGH (ref 11.5–15.5)
WBC: 6.6 10*3/uL (ref 4.0–10.5)
nRBC: 0 % (ref 0.0–0.2)

## 2022-07-01 LAB — BASIC METABOLIC PANEL
Anion gap: 8 (ref 5–15)
BUN: 5 mg/dL — ABNORMAL LOW (ref 8–23)
CO2: 28 mmol/L (ref 22–32)
Calcium: 8 mg/dL — ABNORMAL LOW (ref 8.9–10.3)
Chloride: 100 mmol/L (ref 98–111)
Creatinine, Ser: 0.48 mg/dL (ref 0.44–1.00)
GFR, Estimated: >60 mL/min (ref >60)
Glucose, Bld: 105 mg/dL — ABNORMAL HIGH (ref 70–99)
Potassium: 3.5 mmol/L (ref 3.5–5.1)
Sodium: 136 mmol/L (ref 135–145)

## 2022-07-01 LAB — TYPE AND SCREEN
ABO/RH(D): O NEG
Antibody Screen: NEGATIVE
Unit division: 0
Unit division: 0

## 2022-07-01 LAB — BPAM RBC
Blood Product Expiration Date: 202309012359
Blood Product Expiration Date: 202309012359
ISSUE DATE / TIME: 202308121152
ISSUE DATE / TIME: 202308121420
Unit Type and Rh: 9500
Unit Type and Rh: 9500

## 2022-07-01 MED ORDER — SUCRALFATE 1 GM/10ML PO SUSP: 1.0000 g | Freq: Three times a day (TID) | ORAL | 0 refills | Status: DC

## 2022-07-01 MED ORDER — TRELEGY ELLIPTA 100-62.5-25 MCG/ACT IN AEPB: 1.0000 | INHALATION_SPRAY | Freq: Every day | RESPIRATORY_TRACT | 3 refills | Status: DC

## 2022-07-01 MED ORDER — ALBUTEROL SULFATE HFA 108 (90 BASE) MCG/ACT IN AERS: 2.0000 | INHALATION_SPRAY | Freq: Four times a day (QID) | RESPIRATORY_TRACT | 3 refills | Status: DC | PRN

## 2022-07-01 MED ORDER — STIOLTO RESPIMAT 2.5-2.5 MCG/ACT IN AERS
2.0000 | INHALATION_SPRAY | Freq: Every day | RESPIRATORY_TRACT | 3 refills | Status: DC
Start: 2022-07-01 — End: 2022-08-26

## 2022-07-01 MED ORDER — HEPARIN SOD (PORK) LOCK FLUSH 100 UNIT/ML IV SOLN
500.0000 [IU] | Freq: Once | INTRAVENOUS | Status: AC
Start: 2022-07-01 — End: 2022-07-01
  Administered 2022-07-01: 500 [IU] via INTRAVENOUS
  Filled 2022-07-01: qty 5

## 2022-07-01 MED ORDER — PANTOPRAZOLE SODIUM 40 MG PO TBEC: 40.0000 mg | DELAYED_RELEASE_TABLET | Freq: Two times a day (BID) | ORAL | 3 refills | Status: AC

## 2022-07-01 MED ORDER — ASPIRIN EC 81 MG PO TBEC: 81.0000 mg | DELAYED_RELEASE_TABLET | Freq: Every day | ORAL | 11 refills | Status: DC

## 2022-07-01 NOTE — Discharge Instructions (Signed)
1)Avoid ibuprofen/Advil/Aleve/Motrin/Goody Powders/Naproxen/BC powders/Meloxicam/Diclofenac/Indomethacin and other Nonsteroidal anti-inflammatory medications as these will make you more likely to bleed and can cause stomach ulcers, can also cause Kidney problems.   2) please keep your appointment with your gastroenterologist as previously scheduled for 07/15/2022  3) please repeat CBC and BMP in about 1 week with your primary care physician

## 2022-07-01 NOTE — Progress Notes (Signed)
  Transition of Care [facility]) Screening Note   Patient Details  Name: [name] Date of Birth: 27-Jun-1956   Transition of Care [facility]) CM/SW Contact:    Iona Beard, [place] Phone Number: 07/01/2022, 11:27 AM    Transition of Care Department [facility]) has reviewed patient and no TOC needs have been identified at this time. We will continue to monitor patient advancement through interdisciplinary progression rounds. If new patient transition needs arise, please place a TOC consult.

## 2022-07-01 NOTE — Discharge Summary (Signed)
[name], is a 66 y.o. female  DOB 1956/02/11  MRN [id number].  Admission date:  06/28/2022  Admitting Physician  Truett Mainland, DO  Discharge Date:  07/01/2022   Primary MD  [name], MD  Recommendations for primary care physician for things to follow:   1)Avoid ibuprofen/Advil/Aleve/Motrin/Goody Powders/Naproxen/BC powders/Meloxicam/Diclofenac/Indomethacin and other Nonsteroidal anti-inflammatory medications as these will make you more likely to bleed and can cause stomach ulcers, can also cause Kidney problems.   2) please keep your appointment with your gastroenterologist as previously scheduled for 07/15/2022  3) please repeat CBC and BMP in about 1 week with your primary care physician  Admission Diagnosis  COPD exacerbation ([place]) [J44.1] Esophagitis [K20.90]   Discharge Diagnosis  COPD exacerbation ([place]) [J44.1] Esophagitis [K20.90]    Principal Problem:   Esophagitis Active Problems:   COPD (chronic obstructive pulmonary disease) ([place])   Hypertension   Chronic respiratory failure with hypoxia ([place])   Peritoneal carcinomatosis ([place])   Ovarian cancer ([place])   Upper GI bleed   ABLA (acute blood loss anemia)      Past Medical History:  Diagnosis Date   Anemia    Aortic atherosclerosis (HCC)    Arthritis    Asthma    Bilateral carotid artery stenosis    Cancer (HCC)    Gastric Cancer   COPD (chronic obstructive pulmonary disease) (HCC)    High cholesterol    Hyperlipidemia    Hypertension    Neuropathy    Osteoarthritis    Ovarian cancer ([place])    Oxygen dependent    Peripheral vascular disease ([place])    Peritoneal carcinoma ([place])    Pneumonia 2019   Port-A-Cath in place 07/03/2020   Pulmonary emphysema ([place])     Past Surgical History:  Procedure Laterality Date   ESOPHAGOGASTRODUODENOSCOPY (EGD) WITH PROPOFOL N/A 02/17/2021   Procedure: ESOPHAGOGASTRODUODENOSCOPY  (EGD) WITH PROPOFOL;  Surgeon: Harvel Quale, MD;  [facility];  Service: Gastroenterology;  Laterality: N/A;   ESOPHAGOGASTRODUODENOSCOPY (EGD) WITH PROPOFOL N/A 05/04/2022   Procedure: ESOPHAGOGASTRODUODENOSCOPY (EGD) WITH PROPOFOL;  Surgeon: Daneil Dolin, MD;  [facility];  Service: Endoscopy;  Laterality: N/A;   HALLUX VALGUS BASE WEDGE Right 06/09/2015   Procedure: Base wedge osteotomy with modified McBride right foot ;  Surgeon: Sharlotte Alamo, MD;  Location: [facility];  Service: Podiatry;  Laterality: Right;   PORTACATH PLACEMENT Left 06/28/2020   Procedure: PORT-A-CATHETER PLACEMENT LEFT CHEST (attached catheter in left subclavian);  Surgeon: Virl Cagey, MD;  Location: [facility];  Service: General;  Laterality: Left;   TUBAL LIGATION     VIDEO BRONCHOSCOPY WITH ENDOBRONCHIAL NAVIGATION N/A 03/09/2021   Procedure: VIDEO BRONCHOSCOPY WITH ENDOBRONCHIAL NAVIGATION;  Surgeon: Ottie Glazier, MD;  Location: [facility];  Service: Thoracic;  Laterality: N/A;   VIDEO BRONCHOSCOPY WITH ENDOBRONCHIAL ULTRASOUND N/A 03/09/2021   Procedure: VIDEO BRONCHOSCOPY WITH ENDOBRONCHIAL ULTRASOUND;  Surgeon: Ottie Glazier, MD;  Location: [facility];  Service: Thoracic;  Laterality: N/A;  HPI  from the history and physical done on the day of admission:    HPI: [name] is a 66 y.o. 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  Patient seen for back pain, vomiting and difficulty swallowing.  Was seen by pulmonology yesterday and had a chest x-ray which did not look like she had a pneumonia.  She did have a sputum culture which was growing out Pseudomonas.  She was prescribed Levaquin for this.  She also had blood work done and had an elevated D-dimer.  The patient was instructed to come to the  hospital for evaluation due to the elevated D-dimer to ensure that she did not have a pulmonary embolism.  Here, she notes pain in her back which is moderate.  There is no radiation.  No palliating or provoking factors.  She was also noted to have a lot of nausea and vomiting.  She describes her emesis as black.  This has been going on over the past couple of weeks.  She was hospitalized a couple of months ago due to distal esophagitis.  She was discharged on Protonix and Carafate.  She completed the course of Carafate and has continued the PPI.  She does find it difficult to swallow occasionally.  Denies odynophagia.  No cough, shortness of breath, wheezing.   Review of Systems: As mentioned in the history of present illness. All other systems reviewed and are negative.    Hospital Course:  Brief Narrative:  66 y.o. female with medical history significant of ovarian cancer with peritoneal carcinoma on chemotherapy, COPD with chronic respiratory failure and steroid dependence, hypertension, aortic arthrosclerosis, chronic anemia admitted on 06/28/2022 with acute on chronic anemia and dysphagia with intractable emesis   Assessment and Plan:  1)Acute on chronic anemia--upper GI bleed suspected -Patient with underlying chronic anemia in the setting of ovarian malignancy with peritoneal carcinomatosis and ongoing chemotherapy -EGD on 07/02/2022 with  Esophagitis  in the lower third though about 5 to 6 cm.  Schatzki's ring and small hiatal hernia.  Does have gastritis with 3-4 superficial ulcers , biopsies obtained - --Hgb is up to 11.2 from 6.7 after transfusion of 2 units of PRBC on 06/29/2022 -Treated with IV Protonix SWITCHED TO P.O. [place]  patient has follow-up with GI appointment on 07/15/2022---pathology results will be discussed with patient by GI team at that visit -KOH prep negative -Avoid NSAIDs   2)Recent Pseudomonas pulmonary infection superimposed on COPD -Okay to  complete Levaquin as previously prescribed -Continue bronchodilators   3)Esophagitis and dysphagia--- Protonix and Carafate as ordered -EGD as above #1   4) chronic hypoxic respiratory failure--- secondary to COPD -Continue nasal cannula 3 L/min which is baseline for patient -Oxygen requirement is currently at baseline continue same   5) ovarian malignancy with peritoneal carcinomatosis -Undergoing chemo -Outpatient follow-up with oncologist   6)HTN--continue amlodipine   Disposition: The patient is from: Home              Anticipated d/c is to: Home  Discharge Condition: -Stable  Follow UP----GI team on 07/15/2022   Consults obtained -GI  Diet and Activity recommendation:  As advised  Discharge Instructions    Discharge Instructions     Call MD for:  difficulty breathing, headache or visual disturbances   Complete by: As directed    Call MD for:  persistant dizziness or  light-headedness   Complete by: As directed    Call MD for:  persistant nausea and vomiting   Complete by: As directed    Call MD for:  temperature >100.4   Complete by: As directed    Diet general   Complete by: As directed    Discharge instructions   Complete by: As directed    1)Avoid ibuprofen/Advil/Aleve/Motrin/Goody Powders/Naproxen/BC powders/Meloxicam/Diclofenac/Indomethacin and other Nonsteroidal anti-inflammatory medications as these will make you more likely to bleed and can cause stomach ulcers, can also cause Kidney problems.   2) please keep your appointment with your gastroenterologist as previously scheduled for 07/15/2022  3) please repeat CBC and BMP in about 1 week with your primary care physician   Increase activity slowly   Complete by: As directed         Discharge Medications     Allergies as of 07/01/2022   No Known Allergies      Medication List     STOP taking these medications    ipratropium 17 MCG/ACT inhaler Commonly known as: ATROVENT HFA       TAKE  these medications    albuterol 108 (90 Base) MCG/ACT inhaler Commonly known as: VENTOLIN HFA Inhale 2 puffs into the lungs every 6 (six) hours as needed for wheezing or shortness of breath.   amLODipine 5 MG tablet Commonly known as: NORVASC Take 5 mg by mouth at bedtime.   aspirin EC 81 MG tablet Take 1 tablet (81 mg total) by mouth daily with breakfast. What changed: when to take this   atorvastatin 40 MG tablet Commonly known as: LIPITOR Take 1 tablet (40 mg total) by mouth daily.   ferrous sulfate 325 (65 FE) MG tablet Take 325 mg by mouth every other day.   HYDROcodone-acetaminophen 5-325 MG tablet Commonly known as: NORCO/VICODIN Take 1 tablet by mouth 4 (four) times daily as needed for pain.   levalbuterol 1.25 MG/3ML nebulizer solution Commonly known as: XOPENEX Inhale into the lungs.   levofloxacin 500 MG tablet Commonly known as: LEVAQUIN Take 500 mg by mouth daily.   magnesium oxide 400 (240 Mg) MG tablet Commonly known as: MAG-OX Take 1 tablet by mouth twice daily   MUCOMYST-10 IN Inhale 1 vial into the lungs every three (3) days as needed (copd).   nortriptyline 25 MG capsule Commonly known as: PAMELOR Take 25 mg by mouth at bedtime.   OXYGEN Inhale 3-4 L into the lungs continuous.   pantoprazole 40 MG tablet Commonly known as: Protonix Take 1 tablet (40 mg total) by mouth 2 (two) times daily.   predniSONE 5 MG tablet Commonly known as: DELTASONE Take 5 mg by mouth daily with breakfast.   primidone 50 MG tablet Commonly known as: MYSOLINE Take 50 mg by mouth at bedtime.   Stiolto Respimat 2.5-2.5 MCG/ACT Aers Generic drug: Tiotropium Bromide-Olodaterol Inhale 2 puffs into the lungs daily at 2 PM.   sucralfate 1 GM/10ML suspension Commonly known as: CARAFATE Take 10 mLs (1 g total) by mouth 4 (four) times daily -  with meals and at bedtime for 14 days.   Trelegy Ellipta 100-62.5-25 MCG/ACT Aepb Generic drug:  Fluticasone-Umeclidin-Vilant Inhale 1 puff into the lungs daily at 2 PM. What changed:  how much to take when to take this   Zejula 200 MG Tabs Generic drug: Niraparib Tosylate Take 1 tablet by mouth daily.       Major procedures and Radiology Reports - PLEASE review detailed and final reports for all details,  in brief -   CT Angio Chest PE W/Cm &/Or Wo Cm  Result Date: 06/28/2022 CLINICAL DATA:  Shortness of breath EXAM: CT ANGIOGRAPHY CHEST WITH CONTRAST TECHNIQUE: Multidetector CT imaging of the chest was performed using the standard protocol during bolus administration of intravenous contrast. Multiplanar CT image reconstructions and MIPs were obtained to evaluate the vascular anatomy. RADIATION DOSE REDUCTION: This exam was performed according to the departmental dose-optimization program which includes automated exposure control, adjustment of the mA and/or kV according to patient size and/or use of iterative reconstruction technique. CONTRAST:  38m OMNIPAQUE IOHEXOL 350 MG/ML SOLN COMPARISON:  05/03/2022 FINDINGS: Cardiovascular: Satisfactory opacification of the pulmonary arteries to the segmental level. No evidence of pulmonary embolism. Normal heart size. No pericardial effusion. Moderate coronary artery calcifications. Aortic atherosclerosis. Mediastinum/Nodes: Redemonstrated moderate to severe circumferential wall thickening in the lower esophagus, similar to the prior exam. New moderate wall thickening in the upper and mid esophagus. Fluid in the lower esophagus, which increases the risk of aspiration. No enlarged mediastinal, hilar, or axillary lymph nodes. The trachea is patent. Lungs/Pleura: Redemonstrated bronchiectasis of the left lower lobe and lingula with clustered centrilobular nodules, slightly improved in the lingula, compared to the prior exam. Unchanged linear opacities in the bilateral upper lobes, likely scarring. No pleural effusion or pneumothorax. Apical predominant  centrilobular and paraseptal emphysema. Upper Abdomen: No acute abnormality. Musculoskeletal: S shaped curvature of the thoracolumbar spine. No acute osseous abnormality. No chest wall abnormality. Review of the MIP images confirms the above findings. IMPRESSION: 1. Negative for pulmonary embolism. 2. Moderate circumferential wall thickening in the proximal esophagus, which is new compared to 05/03/2022, with redemonstrated moderate to severe thickening of the distal esophagus, likely representing esophagitis. Endoscopy is again recommended for further evaluation. In addition, there is fluid in the lower esophagus, which increases the risk of aspiration. 3. Redemonstrated bronchiectasis in the lingula and left lower lobe with clustered centrilobular nodules, which have improved slightly in the lingula but remain concerning for chronic atypical infection or recurrent aspiration. 4. Aortic Atherosclerosis (ICD10-I70.0) and Emphysema (ICD10-J43.9). Electronically Signed   By: AMerilyn BabaM.D.   On: 06/28/2022 14:15   DG Chest Portable 1 View  Result Date: 06/28/2022 CLINICAL DATA:  Chest pain for 3 weeks. History of COPD. Patient on home oxygen. EXAM: PORTABLE CHEST 1 VIEW COMPARISON:  Chest radiograph, 05/03/2022 and prior studies. Chest CT, 05/03/2022. FINDINGS: Cardiac silhouette normal in size. No mediastinal or hilar masses. Stable left anterior chest wall Port-A-Cath. Stable areas of lung scarring and interstitial thickening. No lung consolidation. No evidence of edema. Relative volume loss on the left. No pleural effusion or pneumothorax. Skeletal structures are grossly intact. IMPRESSION: 1. No acute cardiopulmonary disease. 2. Chronic lung changes stable from the prior study. Electronically Signed   By: DLajean ManesM.D.   On: 06/28/2022 11:44   CT Abdomen Pelvis W Contrast  Result Date: 06/05/2022 CLINICAL DATA:  Restaging ovarian cancer. Initial diagnosis in 2021. EXAM: CT ABDOMEN AND PELVIS WITH  CONTRAST TECHNIQUE: Multidetector CT imaging of the abdomen and pelvis was performed using the standard protocol following bolus administration of intravenous contrast. RADIATION DOSE REDUCTION: This exam was performed according to the departmental dose-optimization program which includes automated exposure control, adjustment of the mA and/or kV according to patient size and/or use of iterative reconstruction technique. CONTRAST:  1077mOMNIPAQUE IOHEXOL 300 MG/ML  SOLN COMPARISON:  CT scan 03/10/2022 FINDINGS: Lower chest: Heart is normal in size. No pericardial effusion. Stable small hiatal  hernia. Stable left lower lobe bronchiectasis and scarring changes. No worrisome pulmonary lesions or pleural effusions. Hepatobiliary: Stable tiny peripheral low-attenuation lesion and segment 6. No worrisome hepatic lesions or intrahepatic biliary dilatation. The gallbladder is unremarkable. No common bile duct dilatation. Pancreas: No mass, inflammation or ductal dilatation. Spleen: With normal size.  Small calcified granulomas are stable. Adrenals/Urinary Tract: The adrenal glands are normal. No renal lesions or hydronephrosis.  The bladder is unremarkable. Stomach/Bowel: With the stomach, duodenum, small bowel and colon are unremarkable. No acute inflammatory process, mass lesions or obstructive findings. The terminal ileum and appendix are normal. Stable colonic diverticulosis but no findings for acute diverticulitis. Vascular/Lymphatic: Stable significant age advanced atherosclerotic calcifications involving the aorta and iliac arteries and branch vessels but no aneurysm or dissection. The major venous structures are patent. No mesenteric or retroperitoneal mass or adenopathy. Stable interstitial nodularity type changes in the greater omentum but no new or progressive findings and no measurable nodules. No new peritoneal surface lesions or pelvic implants. Reproductive: Stable fibroid uterus with large dominant  partially calcified fibroid. The ovaries are unremarkable. Other: No pelvic or inguinal lymphadenopathy. Musculoskeletal: No worrisome bone lesions. Stable degenerative changes at the pubic symphysis. IMPRESSION: 1. Stable interstitial nodularity type changes in the greater omentum but no new or progressive findings and no measurable nodules. 2. No new peritoneal implants or adenopathy. 3. Stable fibroid uterus. 4. Stable significant age advanced atherosclerotic calcifications involving the aorta and branch vessels. * Tracking Code: BO * Aortic Atherosclerosis (ICD10-I70.0). Electronically Signed   By: Marijo Sanes M.D.   On: 06/05/2022 19:00    Micro Results   Recent Results (from the past 240 hour(s))  SARS Coronavirus 2 by RT PCR (hospital order, performed in [facility] hospital lab) *cepheid single result test* Anterior Nasal Swab     Status: None   Collection Time: 06/28/22 12:11 PM   Specimen: Anterior Nasal Swab  Result Value Ref Range Status   SARS Coronavirus 2 by RT PCR NEGATIVE NEGATIVE Final    Comment: (NOTE) SARS-CoV-2 target nucleic acids are NOT DETECTED.  The SARS-CoV-2 RNA is generally detectable in upper and lower respiratory specimens during the acute phase of infection. The lowest concentration of SARS-CoV-2 viral copies this assay can detect is 250 copies / mL. A negative result does not preclude SARS-CoV-2 infection and should not be used as the sole basis for treatment or other patient management decisions.  A negative result may occur with improper specimen collection / handling, submission of specimen other than nasopharyngeal swab, presence of viral mutation(s) within the areas targeted by this assay, and inadequate number of viral copies (<250 copies / mL). A negative result must be combined with clinical observations, patient history, and epidemiological information.  Fact Sheet for Patients:   https://www.patel.info/  Fact Sheet for  Healthcare Providers: https://hall.com/  This test is not yet approved or  cleared by the [place] FDA and has been authorized for detection and/or diagnosis of SARS-CoV-2 by FDA under an Emergency Use Authorization (EUA).  This EUA will remain in effect (meaning this test can be used) for the duration of the COVID-19 declaration under Section 564(b)(1) of the Act, 21 U.S.C. section 360bbb-3(b)(1), unless the authorization is terminated or revoked sooner.  Performed at [facility], [address], [place], [place] [address]   KOH prep     Status: None   Collection Time: 06/30/22  9:08 AM   Specimen: PATH GI Other; Tissue  Result Value Ref Range Status  Specimen Description ESOPHAGUS  Final   Special Requests NONE  Final   KOH Prep   Final    NO YEAST OR FUNGAL ELEMENTS SEEN Performed at [facility], [address] [place], [place] [address]    Report Status 06/30/2022 FINAL  Final   Today   Subjective    [name] today has no new complaints -Eating and drinking well -No BM yet today -No abdominal pain          Patient has been seen and examined prior to discharge   Objective   Blood pressure 118/75, pulse 95, temperature 98.5 F (36.9 C), temperature source Oral, resp. rate 14, height 5' (1.524 m), weight 55.1 kg, SpO2 98 %.   Intake/Output Summary (Last 24 hours) at 07/01/2022 1355 Last data filed at 07/01/2022 0900 Gross per 24 hour  Intake 480 ml  Output --  Net 480 ml   Exam Gen:- Awake Alert,  in no apparent distress  HEENT:- [place].AT, No sclera icterus Nose- [place] 3L/min Neck-Supple Neck,No JVD,.  Lungs-fair air movement, no wheezing  CV- S1, S2 normal, regular , left subclavian Port-A-Cath Abd-  +ve B.Sounds, Abd Soft, No tenderness,    Extremity/Skin:- No  edema, pedal pulses present  Psych-affect is appropriate, oriented x3 Neuro-no new focal deficits, no tremors   Data Review   CBC w Diff:  Lab Results  Component  Value Date   WBC 6.6 07/01/2022   HGB 11.2 (L) 07/01/2022   HGB 10.6 (L) 11/07/2014   HCT 33.6 (L) 07/01/2022   HCT 24.6 (L) 05/23/2021   PLT 75 (L) 07/01/2022   PLT 323 11/07/2014   LYMPHOPCT 13 06/28/2022   LYMPHOPCT 4.1 10/29/2013   BANDSPCT 7 02/18/2021   MONOPCT 12 06/28/2022   MONOPCT 4.4 10/29/2013   EOSPCT 1 06/28/2022   EOSPCT 0.0 10/29/2013   BASOPCT 1 06/28/2022   BASOPCT 0.2 10/29/2013   CMP:  Lab Results  Component Value Date   NA 136 07/01/2022   NA 139 11/07/2014   K 3.5 07/01/2022   K 3.6 11/07/2014   CL 100 07/01/2022   CL 102 11/07/2014   CO2 28 07/01/2022   CO2 29 11/07/2014   BUN 5 (L) 07/01/2022   BUN 6 (L) 11/07/2014   CREATININE 0.48 07/01/2022   CREATININE 0.65 11/07/2014   PROT 6.9 06/04/2022   PROT 6.9 10/18/2014   ALBUMIN 3.8 06/04/2022   ALBUMIN 3.9 10/18/2014   BILITOT 0.6 06/04/2022   BILITOT 0.3 10/18/2014   ALKPHOS 88 06/04/2022   ALKPHOS 98 10/18/2014   AST 28 06/04/2022   AST 24 10/18/2014   ALT 16 06/04/2022   ALT 19 10/18/2014   Total Discharge time is about 33 minutes  Roxan Hockey M.D on 07/01/2022 at 1:55 PM  Go to [email or web address] -  for contact info  [facility] - Office  [phone]

## 2022-07-01 NOTE — Telephone Encounter (Signed)
Patient has OV 07/15/22

## 2022-07-01 NOTE — Progress Notes (Signed)
Port deaccessed .  Discharge instructions reviewed and patient calling ride to go home.  Will bring portable O2 from home.

## 2022-07-01 NOTE — Plan of Care (Signed)

## 2022-07-02 LAB — SURGICAL PATHOLOGY

## 2022-07-03 ENCOUNTER — Other Ambulatory Visit (HOSPITAL_COMMUNITY): Payer: Self-pay

## 2022-07-04 ENCOUNTER — Other Ambulatory Visit (HOSPITAL_COMMUNITY): Payer: Self-pay

## 2022-07-04 ENCOUNTER — Encounter (HOSPITAL_COMMUNITY): Payer: Self-pay | Admitting: Internal Medicine

## 2022-07-15 ENCOUNTER — Encounter (INDEPENDENT_AMBULATORY_CARE_PROVIDER_SITE_OTHER): Payer: Self-pay | Admitting: Gastroenterology

## 2022-07-15 ENCOUNTER — Encounter (INDEPENDENT_AMBULATORY_CARE_PROVIDER_SITE_OTHER): Payer: Self-pay

## 2022-07-15 ENCOUNTER — Other Ambulatory Visit (INDEPENDENT_AMBULATORY_CARE_PROVIDER_SITE_OTHER): Payer: Self-pay

## 2022-07-15 ENCOUNTER — Telehealth (INDEPENDENT_AMBULATORY_CARE_PROVIDER_SITE_OTHER): Payer: Self-pay

## 2022-07-15 ENCOUNTER — Ambulatory Visit (INDEPENDENT_AMBULATORY_CARE_PROVIDER_SITE_OTHER): Payer: Medicare HMO | Admitting: Gastroenterology

## 2022-07-15 VITALS — BP 146/80 | HR 112 | Temp 97.5°F | Ht 60.0 in | Wt 118.6 lb

## 2022-07-15 DIAGNOSIS — Z1211 Encounter for screening for malignant neoplasm of colon: Secondary | ICD-10-CM

## 2022-07-15 DIAGNOSIS — K209 Esophagitis, unspecified without bleeding: Secondary | ICD-10-CM | POA: Diagnosis not present

## 2022-07-15 DIAGNOSIS — K2081 Other esophagitis with bleeding: Secondary | ICD-10-CM | POA: Diagnosis not present

## 2022-07-15 DIAGNOSIS — K2101 Gastro-esophageal reflux disease with esophagitis, with bleeding: Secondary | ICD-10-CM | POA: Diagnosis not present

## 2022-07-15 DIAGNOSIS — K219 Gastro-esophageal reflux disease without esophagitis: Secondary | ICD-10-CM | POA: Insufficient documentation

## 2022-07-15 LAB — CBC WITH DIFFERENTIAL/PLATELET
Absolute Monocytes: 887 cells/uL (ref 200–950)
Basophils Absolute: 58 cells/uL (ref 0–200)
Basophils Relative: 1 %
Eosinophils Absolute: 302 cells/uL (ref 15–500)
Eosinophils Relative: 5.2 %
HCT: 31.9 % — ABNORMAL LOW (ref 35.0–45.0)
Hemoglobin: 10.8 g/dL — ABNORMAL LOW (ref 11.7–15.5)
Lymphs Abs: 1351 cells/uL (ref 850–3900)
MCH: 31.4 pg (ref 27.0–33.0)
MCHC: 33.9 g/dL (ref 32.0–36.0)
MCV: 92.7 fL (ref 80.0–100.0)
MPV: 8.9 fL (ref 7.5–12.5)
Monocytes Relative: 15.3 %
Neutro Abs: 3202 cells/uL (ref 1500–7800)
Neutrophils Relative %: 55.2 %
Platelets: 236 10*3/uL (ref 140–400)
RBC: 3.44 10*6/uL — ABNORMAL LOW (ref 3.80–5.10)
RDW: 15 % (ref 11.0–15.0)
Total Lymphocyte: 23.3 %
WBC: 5.8 10*3/uL (ref 3.8–10.8)

## 2022-07-15 MED ORDER — PEG 3350-KCL-NA BICARB-NACL 420 G PO SOLR: 4000.0000 mL | ORAL | 0 refills | Status: DC

## 2022-07-15 NOTE — Telephone Encounter (Signed)
Mellony Danziger Ann Breandan People, CMA  ?

## 2022-07-15 NOTE — Telephone Encounter (Signed)
Betrice Wanat Ann Labria Wos, CMA  ?

## 2022-07-15 NOTE — Patient Instructions (Addendum)
Continue pantoprazole 40 mg twice a day Explained presumed etiology of reflux symptoms. Instruction provided in the use of antireflux medication - patient should take medication in the morning 30-45 minutes before eating breakfast and supper. Discussed avoidance of eating within 2 hours of lying down to sleep and benefit of blocks to elevate head of bed. Also, will benefit from avoiding carbonated drinks/sodas or food that has tomatoes, spicy or greasy food. Schedule EGD and colonoscopy in mid October Perform blood workup

## 2022-07-15 NOTE — Progress Notes (Unsigned)
[name], M.D. Gastroenterology & Hepatology [facility] [address] [place], [place] [address]  Primary Care Physician: [name], [place] [address]  I will communicate my assessment and recommendations to the referring MD via EMR.  Problems: Grade B esophagitis Upper less intestinal bleeding secondary to esophagitis  History of Present Illness: [name] is a 66 y.o. female with a past medical history of stage IV ovarian cancer, known peritoneal carcinomatosis, O2 dependent chronic respiratory failure due to COPD on 3 L, who presents for follow up after hospitalization for anemia and esophagitis.  The patient was last seen at [facility] by the gastroenterology team on 06/29/2022.  The patient was hospitalized at that time after presenting Pseudomonas pneumonia.  She underwent a CT of the chest as she had elevated D-dimer in the setting of worsening shortness of breath.  The CT angiogram showed extensive esophagitis including the proximal esophagus.  She had multiple episodes of vomiting and was taking pantoprazole twice a day for episode of esophagitis.  She was using Aleve up to 2 times per month.  Notably, she had a drop in her hemoglobin down to 6.7.  Underwent EGD while hospitalized which showed grade B esophagitis between 25 to 31 cm.  Cytology brushings were neg for Candida.  There was a small hiatal hernia and a moderate Schatzki's ring.  There was also presence of moderate inflammation with shallow ulcerations in the gastric body - mild reactive changes, neg for H. Pylori.  Biopsies.  Duodenum was normal.  Patient was transitioned to PPI twice daily and was advised to have a repeat EGD in 8 to 10 weeks.  Patient has been taking Protonix twice a day compliantly. No dysphagia or odynophagia. States that she believes the twice a day dosing is working better that the once a day. She takes  pantoprazole after breakfast or supper.  She was advised to have a repeat CBC but she did not perform this.  Most recent hemoglobin on 07/01/2022 was 11.2.  The patient denies having any other complaints such as nausea, vomiting, fever, chills, hematochezia, melena, hematemesis, abdominal distention, abdominal pain, diarrhea, jaundice, pruritus or weight loss.  She is able to keep an adequate diet.  Last EGD: As above Last Colonoscopy: Never. Neg Cologuard in 2017.  Past Medical History: Past Medical History:  Diagnosis Date   Anemia    Aortic atherosclerosis (HCC)    Arthritis    Asthma    Bilateral carotid artery stenosis    Cancer (HCC)    Gastric Cancer   COPD (chronic obstructive pulmonary disease) (HCC)    High cholesterol    Hyperlipidemia    Hypertension    Neuropathy    Osteoarthritis    Ovarian cancer ([place])    Oxygen dependent    Peripheral vascular disease ([place])    Peritoneal carcinoma ([place])    Pneumonia 2019   Port-A-Cath in place 07/03/2020   Pulmonary emphysema [facility]     Past Surgical History: Past Surgical History:  Procedure Laterality Date   BIOPSY  06/30/2022   Procedure: BIOPSY;  Surgeon: Eloise Harman, DO;  [facility];  Service: Endoscopy;;  gastric   ESOPHAGOGASTRODUODENOSCOPY (EGD) WITH PROPOFOL N/A 02/17/2021   Procedure: ESOPHAGOGASTRODUODENOSCOPY (EGD) WITH PROPOFOL;  Surgeon: Harvel Quale, MD;  [facility];  Service: Gastroenterology;  Laterality: N/A;   ESOPHAGOGASTRODUODENOSCOPY (EGD) WITH PROPOFOL N/A 05/04/2022   Procedure: ESOPHAGOGASTRODUODENOSCOPY (  EGD) WITH PROPOFOL;  Surgeon: Daneil Dolin, MD;  [facility];  Service: Endoscopy;  Laterality: N/A;   ESOPHAGOGASTRODUODENOSCOPY (EGD) WITH PROPOFOL N/A 06/30/2022   Procedure: ESOPHAGOGASTRODUODENOSCOPY (EGD) WITH PROPOFOL;  Surgeon: Eloise Harman, DO;  [facility];  Service: Endoscopy;  Laterality: N/A;   HALLUX VALGUS BASE  WEDGE Right 06/09/2015   Procedure: Base wedge osteotomy with modified McBride right foot ;  Surgeon: Sharlotte Alamo, MD;  Location: [facility];  Service: Podiatry;  Laterality: Right;   PORTACATH PLACEMENT Left 06/28/2020   Procedure: PORT-A-CATHETER PLACEMENT LEFT CHEST (attached catheter in left subclavian);  Surgeon: Virl Cagey, MD;  Location: [facility];  Service: General;  Laterality: Left;   TUBAL LIGATION     VIDEO BRONCHOSCOPY WITH ENDOBRONCHIAL NAVIGATION N/A 03/09/2021   Procedure: VIDEO BRONCHOSCOPY WITH ENDOBRONCHIAL NAVIGATION;  Surgeon: Ottie Glazier, MD;  Location: [facility];  Service: Thoracic;  Laterality: N/A;   VIDEO BRONCHOSCOPY WITH ENDOBRONCHIAL ULTRASOUND N/A 03/09/2021   Procedure: VIDEO BRONCHOSCOPY WITH ENDOBRONCHIAL ULTRASOUND;  Surgeon: Ottie Glazier, MD;  Location: [facility];  Service: Thoracic;  Laterality: N/A;    Family History: Family History  Problem Relation Age of Onset   Alzheimer's disease Mother    COPD Father    Emphysema Father    Hypertension Father    Healthy Sister    Healthy Brother    Alzheimer's disease Maternal Grandmother    Healthy Sister    Healthy Sister    Healthy Sister    Prostate cancer Other        paternal grandmother's brother; dx in early 25s   Breast cancer Neg Hx     Social History: Social History   Tobacco Use  Smoking Status Former   Packs/day: 2.00   Years: 30.00   Total pack years: 60.00   Types: Cigarettes   Quit date: 11/04/2013   Years since quitting: 8.7  Smokeless Tobacco Never   Social History   Substance and Sexual Activity  Alcohol Use No   Social History   Substance and Sexual Activity  Drug Use No    Allergies: No Known Allergies  Medications: Current Outpatient Medications  Medication Sig Dispense Refill   Acetylcysteine (MUCOMYST-10 IN) Inhale 1 vial into the lungs every three (3) days as needed (copd).     albuterol (VENTOLIN HFA) 108 (90 Base) MCG/ACT inhaler Inhale 2 puffs into  the lungs every 6 (six) hours as needed for wheezing or shortness of breath. 18 g 3   amLODipine (NORVASC) 5 MG tablet Take 5 mg by mouth at bedtime.      aspirin EC 81 MG tablet Take 1 tablet (81 mg total) by mouth daily with breakfast. 30 tablet 11   atorvastatin (LIPITOR) 40 MG tablet Take 1 tablet (40 mg total) by mouth daily. 30 tablet 1   ferrous sulfate 325 (65 FE) MG tablet Take 325 mg by mouth every other day.     Fluticasone-Umeclidin-Vilant (TRELEGY ELLIPTA) 100-62.5-25 MCG/ACT AEPB Inhale 1 puff into the lungs daily at 2 PM. 28 each 3   ipratropium (ATROVENT HFA) 17 MCG/ACT inhaler Inhale 2 puffs into the lungs every 6 (six) hours.     levalbuterol (XOPENEX) 1.25 MG/3ML nebulizer solution Inhale into the lungs.     magnesium oxide (MAG-OX) 400 (240 Mg) MG tablet Take 1 tablet by mouth twice daily (Patient taking differently: Take 400 mg by mouth 2 (two) times daily.) 120 tablet 3   Niraparib Tosylate (ZEJULA) 200 MG TABS  Take 1 tablet by mouth daily. 30 tablet 9   nortriptyline (PAMELOR) 25 MG capsule Take 25 mg by mouth at bedtime.     OXYGEN Inhale 3-4 L into the lungs continuous.     pantoprazole (PROTONIX) 40 MG tablet Take 1 tablet (40 mg total) by mouth 2 (two) times daily. (Patient taking differently: Take 40 mg by mouth daily.) 180 tablet 3   predniSONE (DELTASONE) 5 MG tablet Take 5 mg by mouth daily with breakfast.     primidone (MYSOLINE) 50 MG tablet Take 50 mg by mouth at bedtime.     sucralfate (CARAFATE) 1 GM/10ML suspension Take 10 mLs (1 g total) by mouth 4 (four) times daily -  with meals and at bedtime for 14 days. 560 mL 0   HYDROcodone-acetaminophen (NORCO/VICODIN) 5-325 MG tablet Take 1 tablet by mouth 4 (four) times daily as needed for pain. (Patient not taking: Reported on 07/15/2022)     polyethylene glycol-electrolytes (TRILYTE) 420 g solution Take 4,000 mLs by mouth as directed. 4000 mL 0   Tiotropium Bromide-Olodaterol (STIOLTO RESPIMAT) 2.5-2.5 MCG/ACT AERS  Inhale 2 puffs into the lungs daily at 2 PM. 4 g 3   No current facility-administered medications for this visit.    Review of Systems: GENERAL: negative for malaise, night sweats HEENT: No changes in hearing or vision, no nose bleeds or other nasal problems. NECK: Negative for lumps, goiter, pain and significant neck swelling RESPIRATORY: Negative for cough, wheezing CARDIOVASCULAR: Negative for chest pain, leg swelling, palpitations, orthopnea GI: SEE HPI MUSCULOSKELETAL: Negative for joint pain or swelling, back pain, and muscle pain. SKIN: Negative for lesions, rash PSYCH: Negative for sleep disturbance, mood disorder and recent psychosocial stressors. HEMATOLOGY Negative for prolonged bleeding, bruising easily, and swollen nodes. ENDOCRINE: Negative for cold or heat intolerance, polyuria, polydipsia and goiter. NEURO: negative for tremor, gait imbalance, syncope and seizures. The remainder of the review of systems is noncontributory.   Physical Exam: BP (!) 146/80 (BP Location: Left Arm, Patient Position: Sitting, Cuff Size: Small)   Pulse (!) 112   Temp (!) 97.5 F (36.4 C) (Oral)   Ht 5' (1.524 m)   Wt 118 lb 9.6 oz (53.8 kg)   BMI 23.16 kg/m  GENERAL: The patient is AO x3, in no acute distress. Uses portable oxygen. HEENT: Head is normocephalic and atraumatic. EOMI are intact. Mouth is well hydrated and without lesions. NECK: Supple. No masses LUNGS: Clear to auscultation. No presence of rhonchi/wheezing/rales. Adequate chest expansion HEART: RRR, normal s1 and s2. ABDOMEN: Soft, nontender, no guarding, no peritoneal signs, and nondistended. BS +. No masses. EXTREMITIES: Without any cyanosis, clubbing, rash, lesions or edema. NEUROLOGIC: AOx3, no focal motor deficit. SKIN: no jaundice, no rashes  Imaging/Labs: as above  I personally reviewed and interpreted the available labs, imaging and endoscopic files.  Impression and Plan: [name] is a 66 y.o. female  with a past medical history of stage IV ovarian cancer, known peritoneal carcinomatosis, O2 dependent chronic respiratory failure due to COPD on 3 L, who presents for follow up after hospitalization for anemia and esophagitis.  The patient has presented improvement of her symptoms after being discharged from the hospital and is currently tolerating PPI twice a day for management of her esophagitis.  She has been asymptomatic since she admitted to the hospital and her last hemoglobin was stable.  We will continue her on pantoprazole 40 mg twice a day for now but I advised her to take the medication at  least 30 minutes before her meals to improve even more medication efficacy.  We will need to make sure her hemoglobin has remained stable and we will repeat a CBC today.  Finally, the patient will need to have a repeat EGD to assess her esophagus better, which will be scheduled in October.  We will also schedule a colonoscopy at the same time for colorectal cancer screening purposes.  - Continue pantoprazole 40 mg twice a day - Explained presumed etiology of reflux symptoms. Instruction provided in the use of antireflux medication - patient should take medication in the morning 30-45 minutes before eating breakfast and supper. Discussed avoidance of eating within 2 hours of lying down to sleep and benefit of blocks to elevate head of bed. Also, will benefit from avoiding carbonated drinks/sodas or food that has tomatoes, spicy or greasy food. - Schedule EGD and colonoscopy in mid October - Check CBC  All questions were answered.      Harvel Quale, MD Gastroenterology and Hepatology [facility]

## 2022-07-16 ENCOUNTER — Other Ambulatory Visit: Payer: Self-pay

## 2022-07-16 ENCOUNTER — Encounter (INDEPENDENT_AMBULATORY_CARE_PROVIDER_SITE_OTHER): Payer: Self-pay

## 2022-07-17 ENCOUNTER — Other Ambulatory Visit (HOSPITAL_COMMUNITY): Payer: Self-pay | Admitting: Family Medicine

## 2022-07-17 ENCOUNTER — Ambulatory Visit (HOSPITAL_COMMUNITY)
Admission: RE | Admit: 2022-07-17 | Discharge: 2022-07-17 | Disposition: A | Discharge status: Home / Self Care | Payer: Medicare HMO | Source: Ambulatory Visit | Attending: Family Medicine | Admitting: Family Medicine

## 2022-07-17 DIAGNOSIS — J449 Chronic obstructive pulmonary disease, unspecified: Secondary | ICD-10-CM | POA: Insufficient documentation

## 2022-07-26 ENCOUNTER — Other Ambulatory Visit (HOSPITAL_COMMUNITY): Payer: Self-pay

## 2022-07-30 ENCOUNTER — Other Ambulatory Visit (HOSPITAL_COMMUNITY): Payer: Self-pay

## 2022-07-31 ENCOUNTER — Other Ambulatory Visit: Payer: Self-pay

## 2022-07-31 ENCOUNTER — Emergency Department (HOSPITAL_COMMUNITY): Payer: Medicare HMO

## 2022-07-31 ENCOUNTER — Encounter (HOSPITAL_COMMUNITY): Payer: Self-pay | Admitting: Emergency Medicine

## 2022-07-31 ENCOUNTER — Inpatient Hospital Stay (HOSPITAL_COMMUNITY)
Admission: EM | Admit: 2022-07-31 | Discharge: 2022-08-02 | DRG: 190 | Disposition: A | Discharge status: Home / Self Care | Payer: Medicare HMO | Attending: Family Medicine | Admitting: Family Medicine

## 2022-07-31 DIAGNOSIS — E782 Mixed hyperlipidemia: Secondary | ICD-10-CM | POA: Diagnosis present

## 2022-07-31 DIAGNOSIS — Z9851 Tubal ligation status: Secondary | ICD-10-CM | POA: Diagnosis not present

## 2022-07-31 DIAGNOSIS — J439 Emphysema, unspecified: Secondary | ICD-10-CM | POA: Diagnosis present

## 2022-07-31 DIAGNOSIS — Z7952 Long term (current) use of systemic steroids: Secondary | ICD-10-CM

## 2022-07-31 DIAGNOSIS — I10 Essential (primary) hypertension: Secondary | ICD-10-CM | POA: Diagnosis present

## 2022-07-31 DIAGNOSIS — Z8249 Family history of ischemic heart disease and other diseases of the circulatory system: Secondary | ICD-10-CM | POA: Diagnosis not present

## 2022-07-31 DIAGNOSIS — Z9981 Dependence on supplemental oxygen: Secondary | ICD-10-CM | POA: Diagnosis not present

## 2022-07-31 DIAGNOSIS — J441 Chronic obstructive pulmonary disease with (acute) exacerbation: Secondary | ICD-10-CM | POA: Diagnosis not present

## 2022-07-31 DIAGNOSIS — Z7982 Long term (current) use of aspirin: Secondary | ICD-10-CM

## 2022-07-31 DIAGNOSIS — Z825 Family history of asthma and other chronic lower respiratory diseases: Secondary | ICD-10-CM

## 2022-07-31 DIAGNOSIS — Z87891 Personal history of nicotine dependence: Secondary | ICD-10-CM | POA: Diagnosis not present

## 2022-07-31 DIAGNOSIS — K219 Gastro-esophageal reflux disease without esophagitis: Secondary | ICD-10-CM | POA: Diagnosis present

## 2022-07-31 DIAGNOSIS — J9621 Acute and chronic respiratory failure with hypoxia: Secondary | ICD-10-CM | POA: Diagnosis present

## 2022-07-31 DIAGNOSIS — Z79899 Other long term (current) drug therapy: Secondary | ICD-10-CM

## 2022-07-31 DIAGNOSIS — Z20822 Contact with and (suspected) exposure to covid-19: Secondary | ICD-10-CM | POA: Diagnosis present

## 2022-07-31 DIAGNOSIS — Z8543 Personal history of malignant neoplasm of ovary: Secondary | ICD-10-CM

## 2022-07-31 DIAGNOSIS — Z85028 Personal history of other malignant neoplasm of stomach: Secondary | ICD-10-CM | POA: Diagnosis not present

## 2022-07-31 DIAGNOSIS — J9622 Acute and chronic respiratory failure with hypercapnia: Secondary | ICD-10-CM | POA: Diagnosis present

## 2022-07-31 DIAGNOSIS — K2101 Gastro-esophageal reflux disease with esophagitis, with bleeding: Secondary | ICD-10-CM | POA: Diagnosis not present

## 2022-07-31 DIAGNOSIS — I739 Peripheral vascular disease, unspecified: Secondary | ICD-10-CM | POA: Diagnosis present

## 2022-07-31 LAB — BLOOD GAS, ARTERIAL
Acid-Base Excess: 9 mmol/L — ABNORMAL HIGH (ref 0.0–2.0)
Bicarbonate: 34.1 mmol/L — ABNORMAL HIGH (ref 20.0–28.0)
Drawn by: 35043
FIO2: 30 %
O2 Saturation: 98.2 %
Patient temperature: 37.2
pCO2 arterial: 49 mmHg — ABNORMAL HIGH (ref 32–48)
pH, Arterial: 7.45 (ref 7.35–7.45)
pO2, Arterial: 82 mmHg — ABNORMAL LOW (ref 83–108)

## 2022-07-31 LAB — TROPONIN I (HIGH SENSITIVITY)
Troponin I (High Sensitivity): 10 ng/L (ref <18)
Troponin I (High Sensitivity): 16 ng/L (ref <18)

## 2022-07-31 LAB — CBC
HCT: 31.7 % — ABNORMAL LOW (ref 36.0–46.0)
Hemoglobin: 10.4 g/dL — ABNORMAL LOW (ref 12.0–15.0)
MCH: 31.6 pg (ref 26.0–34.0)
MCHC: 32.8 g/dL (ref 30.0–36.0)
MCV: 96.4 fL (ref 80.0–100.0)
Platelets: 184 10*3/uL (ref 150–400)
RBC: 3.29 MIL/uL — ABNORMAL LOW (ref 3.87–5.11)
RDW: 17.2 % — ABNORMAL HIGH (ref 11.5–15.5)
WBC: 4.2 10*3/uL (ref 4.0–10.5)
nRBC: 0 % (ref 0.0–0.2)

## 2022-07-31 LAB — BASIC METABOLIC PANEL
Anion gap: 11 (ref 5–15)
BUN: 8 mg/dL (ref 8–23)
CO2: 33 mmol/L — ABNORMAL HIGH (ref 22–32)
Calcium: 9 mg/dL (ref 8.9–10.3)
Chloride: 93 mmol/L — ABNORMAL LOW (ref 98–111)
Creatinine, Ser: 0.74 mg/dL (ref 0.44–1.00)
GFR, Estimated: >60 mL/min (ref >60)
Glucose, Bld: 115 mg/dL — ABNORMAL HIGH (ref 70–99)
Potassium: 3.5 mmol/L (ref 3.5–5.1)
Sodium: 137 mmol/L (ref 135–145)

## 2022-07-31 LAB — MAGNESIUM: Magnesium: 1.8 mg/dL (ref 1.7–2.4)

## 2022-07-31 LAB — BLOOD GAS, VENOUS
Acid-Base Excess: 13.2 mmol/L — ABNORMAL HIGH (ref 0.0–2.0)
Bicarbonate: 39.4 mmol/L — ABNORMAL HIGH (ref 20.0–28.0)
Drawn by: 62434
FIO2: 56 %
O2 Saturation: 31.4 %
Patient temperature: 37.8
pCO2, Ven: 60 mmHg (ref 44–60)
pH, Ven: 7.43 (ref 7.25–7.43)
pO2, Ven: <31 mmHg — CL (ref 32–45)

## 2022-07-31 LAB — RESP PANEL BY RT-PCR (FLU A&B, COVID) ARPGX2
Influenza A by PCR: NEGATIVE
Influenza B by PCR: NEGATIVE
SARS Coronavirus 2 by RT PCR: NEGATIVE

## 2022-07-31 LAB — BRAIN NATRIURETIC PEPTIDE: B Natriuretic Peptide: 29 pg/mL (ref 0.0–100.0)

## 2022-07-31 LAB — D-DIMER, QUANTITATIVE: D-Dimer, Quant: 1.46 ug/mL-FEU — ABNORMAL HIGH (ref 0.00–0.50)

## 2022-07-31 MED ORDER — IPRATROPIUM-ALBUTEROL 0.5-2.5 (3) MG/3ML IN SOLN
3.0000 mL | Freq: Once | RESPIRATORY_TRACT | Status: AC
  Administered 2022-07-31: 3 mL via RESPIRATORY_TRACT
  Filled 2022-07-31: qty 3

## 2022-07-31 MED ORDER — LACTATED RINGERS IV BOLUS
500.0000 mL | Freq: Once | INTRAVENOUS | Status: AC
  Administered 2022-07-31: 500 mL via INTRAVENOUS

## 2022-07-31 MED ORDER — FLUTICASONE FUROATE-VILANTEROL 100-25 MCG/ACT IN AEPB
1.0000 | INHALATION_SPRAY | Freq: Every day | RESPIRATORY_TRACT | Status: DC
  Administered 2022-08-01 – 2022-08-02 (×2): 1 via RESPIRATORY_TRACT
  Filled 2022-07-31 (×2): qty 28

## 2022-07-31 MED ORDER — ACETAMINOPHEN 650 MG RE SUPP: 650.0000 mg | Freq: Four times a day (QID) | RECTAL | Status: DC | PRN

## 2022-07-31 MED ORDER — UMECLIDINIUM BROMIDE 62.5 MCG/ACT IN AEPB
1.0000 | INHALATION_SPRAY | Freq: Every day | RESPIRATORY_TRACT | Status: DC
  Administered 2022-08-01 – 2022-08-02 (×2): 1 via RESPIRATORY_TRACT
  Filled 2022-07-31 (×2): qty 7

## 2022-07-31 MED ORDER — METHYLPREDNISOLONE SODIUM SUCC 125 MG IJ SOLR
125.0000 mg | Freq: Once | INTRAMUSCULAR | Status: AC
  Administered 2022-07-31: 125 mg via INTRAVENOUS
  Filled 2022-07-31: qty 2

## 2022-07-31 MED ORDER — METHYLPREDNISOLONE SODIUM SUCC 125 MG IJ SOLR
125.0000 mg | Freq: Two times a day (BID) | INTRAMUSCULAR | Status: AC
  Administered 2022-08-01 (×2): 125 mg via INTRAVENOUS
  Filled 2022-07-31 (×2): qty 2

## 2022-07-31 MED ORDER — ACETAMINOPHEN 325 MG PO TABS: 650.0000 mg | ORAL_TABLET | Freq: Four times a day (QID) | ORAL | Status: DC | PRN

## 2022-07-31 MED ORDER — ONDANSETRON HCL 4 MG/2ML IJ SOLN: 4.0000 mg | Freq: Four times a day (QID) | INTRAMUSCULAR | Status: DC | PRN

## 2022-07-31 MED ORDER — IPRATROPIUM-ALBUTEROL 0.5-2.5 (3) MG/3ML IN SOLN
3.0000 mL | Freq: Four times a day (QID) | RESPIRATORY_TRACT | Status: DC
  Administered 2022-08-01 – 2022-08-02 (×6): 3 mL via RESPIRATORY_TRACT
  Filled 2022-07-31 (×6): qty 3

## 2022-07-31 MED ORDER — AMLODIPINE BESYLATE 5 MG PO TABS
5.0000 mg | ORAL_TABLET | Freq: Every day | ORAL | Status: DC
  Filled 2022-07-31: qty 1

## 2022-07-31 MED ORDER — PRIMIDONE 50 MG PO TABS
50.0000 mg | ORAL_TABLET | Freq: Every day | ORAL | Status: DC
  Administered 2022-08-01: 50 mg via ORAL
  Filled 2022-07-31 (×5): qty 1

## 2022-07-31 MED ORDER — ASPIRIN 81 MG PO TBEC
81.0000 mg | DELAYED_RELEASE_TABLET | Freq: Every day | ORAL | Status: DC
  Administered 2022-08-01 – 2022-08-02 (×2): 81 mg via ORAL
  Filled 2022-07-31 (×2): qty 1

## 2022-07-31 MED ORDER — MORPHINE SULFATE (PF) 2 MG/ML IV SOLN
2.0000 mg | INTRAVENOUS | Status: DC | PRN
  Administered 2022-08-01: 2 mg via INTRAVENOUS
  Filled 2022-07-31: qty 1

## 2022-07-31 MED ORDER — ONDANSETRON HCL 4 MG PO TABS: 4.0000 mg | ORAL_TABLET | Freq: Four times a day (QID) | ORAL | Status: DC | PRN

## 2022-07-31 MED ORDER — POTASSIUM CHLORIDE CRYS ER 20 MEQ PO TBCR
40.0000 meq | EXTENDED_RELEASE_TABLET | Freq: Once | ORAL | Status: AC
  Administered 2022-07-31: 40 meq via ORAL
  Filled 2022-07-31: qty 2

## 2022-07-31 MED ORDER — NORTRIPTYLINE HCL 25 MG PO CAPS
25.0000 mg | ORAL_CAPSULE | Freq: Every day | ORAL | Status: DC
  Administered 2022-08-01: 25 mg via ORAL
  Filled 2022-07-31 (×5): qty 1

## 2022-07-31 MED ORDER — ALBUTEROL SULFATE (2.5 MG/3ML) 0.083% IN NEBU
10.0000 mg/h | INHALATION_SOLUTION | Freq: Once | RESPIRATORY_TRACT | Status: AC
  Administered 2022-07-31: 10 mg/h via RESPIRATORY_TRACT
  Filled 2022-07-31: qty 3

## 2022-07-31 MED ORDER — PANTOPRAZOLE SODIUM 40 MG PO TBEC
40.0000 mg | DELAYED_RELEASE_TABLET | Freq: Every day | ORAL | Status: DC
  Administered 2022-08-01 – 2022-08-02 (×2): 40 mg via ORAL
  Filled 2022-07-31 (×2): qty 1

## 2022-07-31 MED ORDER — IOHEXOL 350 MG/ML SOLN
100.0000 mL | Freq: Once | INTRAVENOUS | Status: AC | PRN
  Administered 2022-07-31: 75 mL via INTRAVENOUS

## 2022-07-31 MED ORDER — LEVALBUTEROL HCL 0.63 MG/3ML IN NEBU: 0.6300 mg | INHALATION_SOLUTION | Freq: Four times a day (QID) | RESPIRATORY_TRACT | Status: DC | PRN

## 2022-07-31 MED ORDER — OXYCODONE HCL 5 MG PO TABS: 5.0000 mg | ORAL_TABLET | ORAL | Status: DC | PRN

## 2022-07-31 MED ORDER — HEPARIN SODIUM (PORCINE) 5000 UNIT/ML IJ SOLN
5000.0000 [IU] | Freq: Three times a day (TID) | INTRAMUSCULAR | Status: DC
  Administered 2022-08-01 – 2022-08-02 (×4): 5000 [IU] via SUBCUTANEOUS
  Filled 2022-07-31 (×4): qty 1

## 2022-07-31 MED ORDER — ATORVASTATIN CALCIUM 40 MG PO TABS
40.0000 mg | ORAL_TABLET | Freq: Every day | ORAL | Status: DC
  Administered 2022-08-01 – 2022-08-02 (×2): 40 mg via ORAL
  Filled 2022-07-31 (×2): qty 1

## 2022-07-31 MED ORDER — PREDNISONE 20 MG PO TABS
40.0000 mg | ORAL_TABLET | Freq: Every day | ORAL | Status: DC
  Administered 2022-08-02: 40 mg via ORAL
  Filled 2022-07-31: qty 2

## 2022-07-31 NOTE — Progress Notes (Addendum)
Did ABG on patient, pH 7.45, PaCO2 49, PO2 82, HCO3 34.1, took patient off Bipap and placed back on 3L [place].  Patient says she can breathe better now, rate at 22, O2 sat 96%, HR 114, BS clear and diminished.  Bipap still at bedside if needed.

## 2022-07-31 NOTE — ED Triage Notes (Signed)
Pt c/o chronic increase in SOB x 2-3 months-worse today with tightness in chest. Chronic O2 [place] 3l.

## 2022-07-31 NOTE — ED Provider Notes (Signed)
[facility] EMERGENCY DEPARTMENT Provider Note   CSN: [id number] Arrival date & time: 07/31/22  1351     History  Chief Complaint  Patient presents with   Shortness of Breath    [name] is a 66 y.o. female.   Shortness of Breath Associated symptoms: cough   Patient presents for shortness of breath.  Medical history includes COPD, HTN, anemia, HLD, GERD, arthritis, PVD.  She is on 3 L of supplemental oxygen at baseline.  She has had shortness of breath for the past several weeks.  She endorses a cough that is productive of clear sputum.  Today, shortness of breath worsened.  She did wake up feeling like that.  Currently, patient does have daily inhalers that she uses but does not utilize nebulized breathing treatments at home.  Patient endorses a chest tightness but denies any current pain.     Home Medications Prior to Admission medications   Medication Sig Start Date End Date Taking? Authorizing Provider  Acetylcysteine (MUCOMYST-10 IN) Inhale 1 vial into the lungs every three (3) days as needed (copd).   Yes [provider]  albuterol (VENTOLIN HFA) 108 (90 Base) MCG/ACT inhaler Inhale 2 puffs into the lungs every 6 (six) hours as needed for wheezing or shortness of breath. 07/01/22  Yes Emokpae, Courage, MD  amLODipine (NORVASC) 5 MG tablet Take 5 mg by mouth at bedtime.    Yes [provider]  aspirin EC 81 MG tablet Take 1 tablet (81 mg total) by mouth daily with breakfast. 07/01/22  Yes Emokpae, Courage, MD  atorvastatin (LIPITOR) 40 MG tablet Take 1 tablet (40 mg total) by mouth daily. 01/02/22  Yes Dessa Phi, DO  ferrous sulfate 325 (65 FE) MG tablet Take 325 mg by mouth every other day.   Yes [provider]  Fluticasone-Umeclidin-Vilant (TRELEGY ELLIPTA) 100-62.5-25 MCG/ACT AEPB Inhale 1 puff into the lungs daily at 2 PM. 07/01/22  Yes Emokpae, Courage, MD  ipratropium (ATROVENT HFA) 17 MCG/ACT inhaler Inhale 2 puffs into the lungs every 6  (six) hours.   Yes [provider]  levalbuterol Penne Lash) 1.25 MG/3ML nebulizer solution Inhale into the lungs. 09/28/21  Yes [provider]  magnesium oxide (MAG-OX) 400 (240 Mg) MG tablet Take 1 tablet by mouth twice daily Patient taking differently: Take 400 mg by mouth 2 (two) times daily. 03/04/22  Yes Derek Jack, MD  Niraparib Tosylate (ZEJULA) 200 MG TABS Take 1 tablet by mouth daily. 06/25/22  Yes Derek Jack, MD  nortriptyline (PAMELOR) 25 MG capsule Take 25 mg by mouth at bedtime.   Yes [provider]  OXYGEN Inhale 3-4 L into the lungs continuous.   Yes [provider]  pantoprazole (PROTONIX) 40 MG tablet Take 1 tablet (40 mg total) by mouth 2 (two) times daily. Patient taking differently: Take 40 mg by mouth daily. 07/01/22 09/29/22 Yes Emokpae, Courage, MD  predniSONE (DELTASONE) 5 MG tablet Take 5 mg by mouth daily with breakfast.   Yes [provider]  primidone (MYSOLINE) 50 MG tablet Take 50 mg by mouth at bedtime. 01/30/22  Yes [provider]  Tiotropium Bromide-Olodaterol (STIOLTO RESPIMAT) 2.5-2.5 MCG/ACT AERS Inhale 2 puffs into the lungs daily at 2 PM. 07/01/22  Yes Emokpae, Courage, MD  azithromycin (ZITHROMAX) 250 MG tablet Take 250 mg by mouth as directed. Patient not taking: Reported on 07/31/2022 07/18/22   [provider]  HYDROcodone-acetaminophen (NORCO/VICODIN) 5-325 MG tablet Take 1 tablet by mouth 4 (four) times daily  as needed for pain. Patient not taking: Reported on 07/15/2022 06/27/22   [provider]  polyethylene glycol-electrolytes (TRILYTE) 420 g solution Take 4,000 mLs by mouth as directed. Patient not taking: Reported on 07/31/2022 07/15/22   Harvel Quale, MD  sucralfate (CARAFATE) 1 GM/10ML suspension Take 10 mLs (1 g total) by mouth 4 (four) times daily -  with meals and at bedtime for 14 days. Patient not taking: Reported on 07/31/2022 07/01/22 07/15/22   Roxan Hockey, MD      Allergies    Patient has no known allergies.    Review of Systems   Review of Systems  Respiratory:  Positive for cough, chest tightness and shortness of breath.   All other systems reviewed and are negative.   Physical Exam Updated Vital Signs BP (!) 93/55 (BP Location: Left Arm)   Pulse 93   Temp 98.2 F (36.8 C) (Oral)   Resp (!) 21   Ht 5' (1.524 m)   Wt 53.2 kg   SpO2 98%   BMI 22.91 kg/m  Physical Exam Vitals and nursing note reviewed.  Constitutional:      General: She is not in acute distress.    Appearance: She is well-developed. She is not toxic-appearing or diaphoretic.  HENT:     Head: Normocephalic and atraumatic.     Mouth/Throat:     Mouth: Mucous membranes are moist.     Pharynx: Oropharynx is clear.  Eyes:     Conjunctiva/sclera: Conjunctivae normal.  Cardiovascular:     Rate and Rhythm: Normal rate and regular rhythm.     Heart sounds: No murmur heard. Pulmonary:     Effort: Tachypnea and accessory muscle usage present. No respiratory distress.     Breath sounds: Decreased breath sounds and wheezing present. No rhonchi or rales.  Chest:     Chest wall: No tenderness.  Abdominal:     Palpations: Abdomen is soft.     Tenderness: There is no abdominal tenderness.  Musculoskeletal:        General: No swelling.     Cervical back: Normal range of motion and neck supple.     Right lower leg: No edema.     Left lower leg: No edema.  Skin:    General: Skin is warm and dry.  Neurological:     General: No focal deficit present.     Mental Status: She is alert and oriented to person, place, and time.  Psychiatric:        Mood and Affect: Mood normal.        Behavior: Behavior normal.     ED Results / Procedures / Treatments   Labs (all labs ordered are listed, but only abnormal results are displayed) Labs Reviewed  BASIC METABOLIC PANEL - Abnormal; Notable for the following components:      Result Value   Chloride 93  (*)    CO2 33 (*)    Glucose, Bld 115 (*)    All other components within normal limits  CBC - Abnormal; Notable for the following components:   RBC 3.29 (*)    Hemoglobin 10.4 (*)    HCT 31.7 (*)    RDW 17.2 (*)    All other components within normal limits  BLOOD GAS, VENOUS - Abnormal; Notable for the following components:   pO2, Ven <31 (*)    Bicarbonate 39.4 (*)    Acid-Base Excess 13.2 (*)    All other components within normal limits  D-DIMER,  QUANTITATIVE - Abnormal; Notable for the following components:   D-Dimer, Quant 1.46 (*)    All other components within normal limits  BLOOD GAS, ARTERIAL - Abnormal; Notable for the following components:   pCO2 arterial 49 (*)    pO2, Arterial 82 (*)    Bicarbonate 34.1 (*)    Acid-Base Excess 9.0 (*)    All other components within normal limits  RESP PANEL BY RT-PCR (FLU A&B, COVID) ARPGX2  MRSA NEXT GEN BY PCR, NASAL  BRAIN NATRIURETIC PEPTIDE  MAGNESIUM  COMPREHENSIVE METABOLIC PANEL  MAGNESIUM  CBC WITH DIFFERENTIAL/PLATELET  BLOOD GAS, VENOUS  TROPONIN I (HIGH SENSITIVITY)  TROPONIN I (HIGH SENSITIVITY)    EKG EKG Interpretation  Date/Time:  Wednesday July 31 2022 14:31:23 EDT Ventricular Rate:  125 PR Interval:  162 QRS Duration: 72 QT Interval:  294 QTC Calculation: 424 R Axis:   89 Text Interpretation: Sinus tachycardia Atrial premature complexes Confirmed by Godfrey Pick ([id number]) on 07/31/2022 5:47:57 PM  Radiology CT Angio Chest PE W and/or Wo Contrast  Result Date: 07/31/2022 CLINICAL DATA:  Chest tightness short of breath EXAM: CT ANGIOGRAPHY CHEST WITH CONTRAST TECHNIQUE: Multidetector CT imaging of the chest was performed using the standard protocol during bolus administration of intravenous contrast. Multiplanar CT image reconstructions and MIPs were obtained to evaluate the vascular anatomy. RADIATION DOSE REDUCTION: This exam was performed according to the departmental dose-optimization program which  includes automated exposure control, adjustment of the mA and/or kV according to patient size and/or use of iterative reconstruction technique. CONTRAST:  60m OMNIPAQUE IOHEXOL 350 MG/ML SOLN COMPARISON:  Chest x-ray 07/31/2022, CT chest 06/28/2022, 05/03/2022, 10/30/2021 FINDINGS: Cardiovascular: Satisfactory opacification of the pulmonary arteries to the segmental level. No evidence of pulmonary embolism. Moderate aortic atherosclerosis. No aneurysm or dissection. Mild coronary vascular calcification. Left-sided central venous catheter with tip at the cavoatrial region. Mediastinum/Nodes: Midline trachea. No thyroid mass. No suspicious lymph nodes. Mild circumferential thickening of the distal esophagus with small hiatal hernia. Lungs/Pleura: Emphysema with probable scarring at the apices. Bronchiectasis within the lingula and left lower lobe. Bronchial wall thickening most evident at the left lower lobe. Patchy mucous plugging. Tree-in-bud density within the anterior left upper lobe, subpleural right lower lobe and left lower lobe. No interval consolidation, pleural effusion, or pneumothorax. Upper Abdomen: No acute abnormality. Musculoskeletal: No chest wall abnormality. No acute or significant osseous findings. Review of the MIP images confirms the above findings. IMPRESSION: 1. Negative for acute pulmonary embolus or aortic dissection 2. Emphysema. Bronchiectasis at the lingula and left lower lobe with bronchial wall thickening and tree-in-bud nodularity as seen on multiple priors and likely due to chronic atypical infection. No interval consolidative pneumonia. 3. Circumferential thickening of the distal esophagus with small hiatal hernia, this may be correlated with endoscopy if not already performed. Aortic Atherosclerosis (ICD10-I70.0) and Emphysema (ICD10-J43.9). Electronically Signed   By: KDonavan FoilM.D.   On: 07/31/2022 21:18   DG Chest 2 View  Result Date: 07/31/2022 CLINICAL DATA:  Increasing  shortness of breath over the last 2-3 months with a new nonproductive cough EXAM: CHEST - 2 VIEW COMPARISON:  Chest radiograph 07/17/2022 FINDINGS: No pleural effusion. No pneumothorax. Unchanged cardiac and mediastinal contours. No new focal airspace opacity. Chronic emphysematous change and bibasilar bronchiectasis again noted. Left chest wall port with the tip in the lower SVC. No acute osseous abnormality. IMPRESSION: No active cardiopulmonary disease. Electronically Signed   By: HMarin RobertsM.D.   On: 07/31/2022 15:10  Procedures Procedures    Medications Ordered in ED Medications  methylPREDNISolone sodium succinate (SOLU-MEDROL) 125 mg/2 mL injection 125 mg (125 mg Intravenous Given 08/01/22 0010)    Followed by  predniSONE (DELTASONE) tablet 40 mg (has no administration in time range)  ipratropium-albuterol (DUONEB) 0.5-2.5 (3) MG/3ML nebulizer solution 3 mL (has no administration in time range)  heparin injection 5,000 Units (has no administration in time range)  acetaminophen (TYLENOL) tablet 650 mg (has no administration in time range)    Or  acetaminophen (TYLENOL) suppository 650 mg (has no administration in time range)  oxyCODONE (Oxy IR/ROXICODONE) immediate release tablet 5 mg (has no administration in time range)  morphine (PF) 2 MG/ML injection 2 mg (has no administration in time range)  ondansetron (ZOFRAN) tablet 4 mg (has no administration in time range)    Or  ondansetron (ZOFRAN) injection 4 mg (has no administration in time range)  aspirin EC tablet 81 mg (has no administration in time range)  amLODipine (NORVASC) tablet 5 mg (5 mg Oral Not Given 08/01/22 0001)  atorvastatin (LIPITOR) tablet 40 mg (has no administration in time range)  nortriptyline (PAMELOR) capsule 25 mg (25 mg Oral Not Given 08/01/22 0013)  primidone (MYSOLINE) tablet 50 mg (50 mg Oral Not Given 08/01/22 0013)  fluticasone furoate-vilanterol (BREO ELLIPTA) 100-25 MCG/ACT 1 puff (has no  administration in time range)    And  umeclidinium bromide (INCRUSE ELLIPTA) 62.5 MCG/ACT 1 puff (has no administration in time range)  pantoprazole (PROTONIX) EC tablet 40 mg (has no administration in time range)  levalbuterol (XOPENEX) nebulizer solution 0.63 mg (has no administration in time range)  Oral care mouth rinse (has no administration in time range)  Oral care mouth rinse (has no administration in time range)  methylPREDNISolone sodium succinate (SOLU-MEDROL) 125 mg/2 mL injection 125 mg (125 mg Intravenous Given 07/31/22 1741)  albuterol (PROVENTIL) (2.5 MG/3ML) 0.083% nebulizer solution (10 mg/hr Nebulization Given 07/31/22 1737)  ipratropium-albuterol (DUONEB) 0.5-2.5 (3) MG/3ML nebulizer solution 3 mL (3 mLs Nebulization Given 07/31/22 1737)  potassium chloride SA (KLOR-CON M) CR tablet 40 mEq (40 mEq Oral Given 07/31/22 1851)  lactated ringers bolus 500 mL (0 mLs Intravenous Stopped 07/31/22 2101)  iohexol (OMNIPAQUE) 350 MG/ML injection 100 mL (75 mLs Intravenous Contrast Given 07/31/22 2103)    ED Course/ Medical Decision Making/ A&P                           Medical Decision Making Amount and/or Complexity of Data Reviewed Labs: ordered. Radiology: ordered.  Risk Prescription drug management. Decision regarding hospitalization.   This patient presents to the ED for concern of shortness of breath, this involves an extensive number of treatment options, and is a complaint that carries with it a high risk of complications and morbidity.  The differential diagnosis includes COPD exacerbation, pneumonia, CHF, pneumothorax, pericardial effusion, valvular dysfunction, ACS   Co morbidities that complicate the patient evaluation  COPD, HTN, anemia, HLD, GERD, arthritis, PVD   Additional history obtained:  Additional history obtained from N/A External records from outside source obtained and reviewed including EMR   Lab Tests:  I Ordered, and personally interpreted  labs.  The pertinent results include: Compensated hypercarbia on blood gas, normal troponin, normal BNP, baseline anemia, no leukocytosis, normal electrolytes   Imaging Studies ordered:  I ordered imaging studies including chest x-ray I independently visualized and interpreted imaging which showed no acute findings I agree with the radiologist interpretation  Cardiac Monitoring: / EKG:  The patient was maintained on a cardiac monitor.  I personally viewed and interpreted the cardiac monitored which showed an underlying rhythm of: Sinus rhythm   Problem List / ED Course / Critical interventions / Medication management  Patient presents for shortness of breath.  Although she has been experiencing mild shortness of breath for several weeks, symptoms acutely worsened today.  She does have a history of COPD.  She does not use nebulized breathing treatments at home.  She is on 3 L of oxygen chronically.  On arrival in the ED, patient is alert and oriented.  She has moderately increased work of breathing.  On lung auscultation, she has diminished breath sounds.  She is tachypneic.  She is able to speak in sentences.  She is able to maintain normal SPO2 on her home 3 L.  Diagnostic work-up was initiated and patient was treated for COPD exacerbation with Solu-Medrol, DuoNeb, and continuous albuterol.  Lab work is unremarkable.  There is no evidence of pneumonia on chest x-ray.  A CTA of chest was ordered, however, patient was unable to tolerate laying flat.  Plan will be for trying again following ED treatments.  Despite continuous nebulized breathing treatments, patient remained short of breath with increased work of breathing.  This even appeared to worsen.  She was initiated on BiPAP.  She was able to tolerate this well.  Due to her persistent symptoms, patient was admitted to hospitalist for further management. I ordered medication including DuoNeb, albuterol, Solu-Medrol for COPD exacerbation; IV  fluids for tachycardia; potassium chloride for electrolyte optimization Reevaluation of the patient after these medicines showed that the patient improved I have reviewed the patients home medicines and have made adjustments as needed   Social Determinants of Health:  Has access to outpatient care  CRITICAL CARE Performed by: Godfrey Pick   Total critical care time: 36 minutes  Critical care time was exclusive of separately billable procedures and treating other patients.  Critical care was necessary to treat or prevent imminent or life-threatening deterioration.  Critical care was time spent personally by me on the following activities: development of treatment plan with patient and/or surrogate as well as nursing, discussions with consultants, evaluation of patient's response to treatment, examination of patient, obtaining history from patient or surrogate, ordering and performing treatments and interventions, ordering and review of laboratory studies, ordering and review of radiographic studies, pulse oximetry and re-evaluation of patient's condition.         Final Clinical Impression(s) / ED Diagnoses Final diagnoses:  COPD exacerbation [facility])    Rx / DC Orders ED Discharge Orders     None         Godfrey Pick, MD 08/01/22 0105

## 2022-07-31 NOTE — Progress Notes (Signed)
[name] placed on Bipap 10/5, 30%, R16.  [name]'s venous gas had pH of 7.43, PaCO2 of 60.  [name] stated that [name] felt better after CAT was done.  MD wanted to go ahead and try Bipap to see if this helped [name]'s WOB.  Will draw an ABG in 1 hour to see how [name] is responding to treatment.

## 2022-07-31 NOTE — Progress Notes (Signed)
Moved Bipap from ED1 to patient's new room ICU5.

## 2022-07-31 NOTE — Progress Notes (Addendum)
Transported patient on Bipap to and from CT without incident.

## 2022-08-01 DIAGNOSIS — K2101 Gastro-esophageal reflux disease with esophagitis, with bleeding: Secondary | ICD-10-CM

## 2022-08-01 DIAGNOSIS — E782 Mixed hyperlipidemia: Secondary | ICD-10-CM

## 2022-08-01 DIAGNOSIS — J441 Chronic obstructive pulmonary disease with (acute) exacerbation: Secondary | ICD-10-CM

## 2022-08-01 DIAGNOSIS — J9622 Acute and chronic respiratory failure with hypercapnia: Secondary | ICD-10-CM | POA: Diagnosis not present

## 2022-08-01 DIAGNOSIS — J9621 Acute and chronic respiratory failure with hypoxia: Secondary | ICD-10-CM | POA: Diagnosis not present

## 2022-08-01 DIAGNOSIS — I10 Essential (primary) hypertension: Secondary | ICD-10-CM

## 2022-08-01 LAB — BLOOD GAS, VENOUS
Acid-Base Excess: 7 mmol/L — ABNORMAL HIGH (ref 0.0–2.0)
Bicarbonate: 33.4 mmol/L — ABNORMAL HIGH (ref 20.0–28.0)
Drawn by: 442
O2 Saturation: 79.5 %
Patient temperature: 36.8
pCO2, Ven: 54 mmHg (ref 44–60)
pH, Ven: 7.4 (ref 7.25–7.43)
pO2, Ven: 44 mmHg (ref 32–45)

## 2022-08-01 LAB — CBC WITH DIFFERENTIAL/PLATELET
Abs Immature Granulocytes: 0.02 10*3/uL (ref 0.00–0.07)
Basophils Absolute: 0 10*3/uL (ref 0.0–0.1)
Basophils Relative: 0 %
Eosinophils Absolute: 0 10*3/uL (ref 0.0–0.5)
Eosinophils Relative: 0 %
HCT: 29.1 % — ABNORMAL LOW (ref 36.0–46.0)
Hemoglobin: 9.4 g/dL — ABNORMAL LOW (ref 12.0–15.0)
Immature Granulocytes: 1 %
Lymphocytes Relative: 3 %
Lymphs Abs: 0.1 10*3/uL — ABNORMAL LOW (ref 0.7–4.0)
MCH: 31.1 pg (ref 26.0–34.0)
MCHC: 32.3 g/dL (ref 30.0–36.0)
MCV: 96.4 fL (ref 80.0–100.0)
Monocytes Absolute: 0.1 10*3/uL (ref 0.1–1.0)
Monocytes Relative: 4 %
Neutro Abs: 3.1 10*3/uL (ref 1.7–7.7)
Neutrophils Relative %: 92 %
Platelets: 168 10*3/uL (ref 150–400)
RBC: 3.02 MIL/uL — ABNORMAL LOW (ref 3.87–5.11)
RDW: 17.3 % — ABNORMAL HIGH (ref 11.5–15.5)
WBC: 3.3 10*3/uL — ABNORMAL LOW (ref 4.0–10.5)
nRBC: 0 % (ref 0.0–0.2)

## 2022-08-01 LAB — COMPREHENSIVE METABOLIC PANEL
ALT: 10 U/L (ref 0–44)
AST: 24 U/L (ref 15–41)
Albumin: 3.4 g/dL — ABNORMAL LOW (ref 3.5–5.0)
Alkaline Phosphatase: 88 U/L (ref 38–126)
Anion gap: 8 (ref 5–15)
BUN: 10 mg/dL (ref 8–23)
CO2: 30 mmol/L (ref 22–32)
Calcium: 8.7 mg/dL — ABNORMAL LOW (ref 8.9–10.3)
Chloride: 97 mmol/L — ABNORMAL LOW (ref 98–111)
Creatinine, Ser: 0.64 mg/dL (ref 0.44–1.00)
GFR, Estimated: >60 mL/min (ref >60)
Glucose, Bld: 171 mg/dL — ABNORMAL HIGH (ref 70–99)
Potassium: 3.7 mmol/L (ref 3.5–5.1)
Sodium: 135 mmol/L (ref 135–145)
Total Bilirubin: 0.5 mg/dL (ref 0.3–1.2)
Total Protein: 6.6 g/dL (ref 6.5–8.1)

## 2022-08-01 LAB — MRSA NEXT GEN BY PCR, NASAL: MRSA by PCR Next Gen: NOT DETECTED

## 2022-08-01 LAB — MAGNESIUM: Magnesium: 2.1 mg/dL (ref 1.7–2.4)

## 2022-08-01 MED ORDER — ORAL CARE MOUTH RINSE: 15.0000 mL | OROMUCOSAL | Status: DC | PRN

## 2022-08-01 MED ORDER — ORAL CARE MOUTH RINSE
15.0000 mL | OROMUCOSAL | Status: DC
  Administered 2022-08-01 – 2022-08-02 (×5): 15 mL via OROMUCOSAL

## 2022-08-01 MED ORDER — CHLORHEXIDINE GLUCONATE CLOTH 2 % EX PADS
6.0000 | MEDICATED_PAD | Freq: Every day | CUTANEOUS | Status: DC
  Administered 2022-08-01 – 2022-08-02 (×2): 6 via TOPICAL

## 2022-08-01 MED ORDER — AZITHROMYCIN 250 MG PO TABS
500.0000 mg | ORAL_TABLET | Freq: Every day | ORAL | Status: DC
  Administered 2022-08-01 – 2022-08-02 (×2): 500 mg via ORAL
  Filled 2022-08-01 (×2): qty 2

## 2022-08-01 MED ORDER — FUROSEMIDE 40 MG PO TABS
40.0000 mg | ORAL_TABLET | Freq: Every day | ORAL | Status: AC
  Administered 2022-08-01: 40 mg via ORAL
  Filled 2022-08-01: qty 1

## 2022-08-01 MED ORDER — ALUM & MAG HYDROXIDE-SIMETH 200-200-20 MG/5ML PO SUSP
30.0000 mL | ORAL | Status: DC | PRN
  Administered 2022-08-01: 30 mL via ORAL
  Filled 2022-08-01: qty 30

## 2022-08-01 MED ORDER — GUAIFENESIN-DM 100-10 MG/5ML PO SYRP
10.0000 mL | ORAL_SOLUTION | Freq: Three times a day (TID) | ORAL | Status: DC
  Administered 2022-08-01 – 2022-08-02 (×4): 10 mL via ORAL
  Filled 2022-08-01 (×4): qty 10

## 2022-08-01 MED ORDER — BUDESONIDE 0.25 MG/2ML IN SUSP
0.2500 mg | Freq: Two times a day (BID) | RESPIRATORY_TRACT | Status: DC
  Administered 2022-08-01 – 2022-08-02 (×3): 0.25 mg via RESPIRATORY_TRACT
  Filled 2022-08-01 (×3): qty 2

## 2022-08-01 NOTE — Assessment & Plan Note (Signed)
Continue statin. 

## 2022-08-01 NOTE — Assessment & Plan Note (Addendum)
-  Shortness of breath he was improved at rest, still having shortness of breath with exertion  -Currently addressed on 3 L of oxygen, satting 100%  -On arrival:  initial VBG showing less than 31 PO2, PCO2 of 60, repeat blood gas shows PCO2 of 49, PO2 82 -Patient was on BiPAP during the interval between the 2 blood gases -Patient was observed in ICU overnight, currently has improved  -Secondary to COPD exacerbation -Patient does wear 3 L nasal cannula chronically  -Continuing underlying cause - COPD exacerbation -Pulmonary Dr. [name] has been consulted

## 2022-08-01 NOTE — H&P (Signed)
History and Physical    Patient: [name] IRW:[id number] DOB: [birth year] DOA: 07/31/2022 DOS: the patient was seen and examined on 08/01/2022 PCP: [name], MD  Patient coming from: Home  Chief Complaint:  Chief Complaint  Patient presents with   Shortness of Breath   HPI: [name] is a 66 y.o. female with medical history significant of COPD, hyperlipidemia, hypertension, chronic respiratory failure on 3 L nasal cannula, and more presents the ED with a chief complaint of dyspnea.  Patient reports she had gradual onset of dyspnea starting around the morning of the 13th when she woke up.  Patient reports she has had an associated dry cough.  Her dyspnea is worse with exertion.  She does have orthopnea but that has been ongoing for years.  Patient reports a tightness in her chest in the center of her chest.  Did not feel like a pain, or pressure, just felt like she could not take a deep breath in.  All of her symptoms feel better at this time.  Patient reports that the BiPAP and breathing treatments helped her symptoms.  She has had no fever at home, no sick contacts, no new environmental exposures.  She reports that her COPD seems to be flaring up every month.  She takes steroids and antibiotics and feels better and when they wear off she has the same symptoms.  She reports last time she saw her doctor for the symptoms was in August.  Patient reports she is using her rescue inhaler 3 times a day.  On review of systems she does report a nosebleed a couple days ago that has not been recurrent.  Patient does not smoke, does not drink, does not use illicit drugs.  She is vaccinated for COVID.  Patient is full code.  Review of Systems: As mentioned in the history of present illness. All other systems reviewed and are negative. 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[place])    Pneumonia 2019   Port-A-Cath in place 07/03/2020   Pulmonary emphysema [facility])    Past Surgical History:  Procedure Laterality Date   BIOPSY  06/30/2022   Procedure: BIOPSY;  Surgeon: Eloise Harman, DO;  [facility];  Service: Endoscopy;;  gastric   ESOPHAGOGASTRODUODENOSCOPY (EGD) WITH PROPOFOL N/A 02/17/2021   Procedure: ESOPHAGOGASTRODUODENOSCOPY (EGD) WITH PROPOFOL;  Surgeon: Harvel Quale, MD;  Location: [facility];  Service: Gastroenterology;  Laterality: N/A;   ESOPHAGOGASTRODUODENOSCOPY (EGD) WITH PROPOFOL N/A 05/04/2022   Procedure: ESOPHAGOGASTRODUODENOSCOPY (EGD) WITH PROPOFOL;  Surgeon: Daneil Dolin, MD;  [facility];  Service: Endoscopy;  Laterality: N/A;   ESOPHAGOGASTRODUODENOSCOPY (EGD) WITH PROPOFOL N/A 06/30/2022   Procedure: ESOPHAGOGASTRODUODENOSCOPY (EGD) WITH PROPOFOL;  Surgeon: Eloise Harman, DO;  [facility];  Service: Endoscopy;  Laterality: N/A;   HALLUX VALGUS BASE WEDGE Right 06/09/2015   Procedure: Base wedge osteotomy with modified McBride right foot ;  Surgeon: Sharlotte Alamo, MD;  Location: [facility] ORS;  Service: Podiatry;  Laterality: Right;   PORTACATH PLACEMENT Left 06/28/2020   Procedure: PORT-A-CATHETER PLACEMENT LEFT CHEST (attached catheter in left subclavian);  Surgeon: Virl Cagey, MD;  Location: [facility];  Service: General;  Laterality: Left;   TUBAL LIGATION     VIDEO BRONCHOSCOPY WITH ENDOBRONCHIAL NAVIGATION N/A 03/09/2021   Procedure: VIDEO BRONCHOSCOPY WITH ENDOBRONCHIAL NAVIGATION;  Surgeon: Ottie Glazier, MD;  Location: [facility] ORS;  Service: Thoracic;  Laterality: N/A;   VIDEO BRONCHOSCOPY WITH ENDOBRONCHIAL ULTRASOUND N/A  03/09/2021   Procedure: VIDEO BRONCHOSCOPY WITH ENDOBRONCHIAL ULTRASOUND;  Surgeon: Ottie Glazier, MD;  Location: [facility] ORS;  Service: Thoracic;  Laterality: N/A;   Social History:  reports that she quit smoking about 8 years ago. Her smoking use included cigarettes. She has a 60.00 pack-year smoking history. She has never used smokeless tobacco. She reports that she does not drink alcohol and does not use drugs.  No Known Allergies  Family History  Problem Relation Age of Onset   Alzheimer's disease Mother    COPD Father    Emphysema Father    Hypertension Father    Healthy Sister    Healthy Brother    Alzheimer's disease Maternal Grandmother    Healthy Sister    Healthy Sister    Healthy Sister    Prostate cancer Other        paternal grandmother's brother; dx in early 56s   Breast cancer Neg Hx     Prior to Admission medications   Medication Sig Start Date End Date Taking? Authorizing Provider  Acetylcysteine (MUCOMYST-10 IN) Inhale 1 vial into the lungs every three (3) days as needed (copd).   Yes [provider]  albuterol (VENTOLIN HFA) 108 (90 Base) MCG/ACT inhaler Inhale 2 puffs into the lungs every 6 (six) hours as needed for wheezing or shortness of breath. 07/01/22  Yes Emokpae, Courage, MD  amLODipine (NORVASC) 5 MG tablet Take 5 mg by mouth at bedtime.    Yes [provider]  aspirin EC 81 MG tablet Take 1 tablet (81 mg total) by mouth daily with breakfast. 07/01/22  Yes Emokpae, Courage, MD  atorvastatin (LIPITOR) 40 MG tablet Take 1 tablet (40 mg total) by mouth daily. 01/02/22  Yes Dessa Phi, DO  ferrous sulfate 325 (65 FE) MG tablet Take 325 mg by mouth every other day.   Yes [provider]  Fluticasone-Umeclidin-Vilant (TRELEGY ELLIPTA) 100-62.5-25 MCG/ACT AEPB Inhale 1 puff into the lungs daily at 2 PM. 07/01/22  Yes Emokpae, Courage, MD  ipratropium (ATROVENT HFA) 17 MCG/ACT inhaler Inhale 2 puffs into the lungs every 6 (six) hours.    Yes [provider]  levalbuterol Penne Lash) 1.25 MG/3ML nebulizer solution Inhale into the lungs. 09/28/21  Yes [provider]  magnesium oxide (MAG-OX) 400 (240 Mg) MG tablet Take 1 tablet by mouth twice daily Patient taking differently: Take 400 mg by mouth 2 (two) times daily. 03/04/22  Yes Derek Jack, MD  Niraparib Tosylate (ZEJULA) 200 MG TABS Take 1 tablet by mouth daily. 06/25/22  Yes Derek Jack, MD  nortriptyline (PAMELOR) 25 MG capsule Take 25 mg by mouth at bedtime.   Yes [provider]  OXYGEN Inhale 3-4 L into the lungs continuous.   Yes [provider]  pantoprazole (PROTONIX) 40 MG tablet Take 1 tablet (40 mg total) by mouth 2 (two) times daily. Patient taking differently: Take 40 mg by mouth daily. 07/01/22 09/29/22 Yes Emokpae, Courage, MD  predniSONE (DELTASONE) 5 MG tablet Take 5 mg  by mouth daily with breakfast.   Yes [provider]  primidone (MYSOLINE) 50 MG tablet Take 50 mg by mouth at bedtime. 01/30/22  Yes [provider]  Tiotropium Bromide-Olodaterol (STIOLTO RESPIMAT) 2.5-2.5 MCG/ACT AERS Inhale 2 puffs into the lungs daily at 2 PM. 07/01/22  Yes Emokpae, Courage, MD  azithromycin (ZITHROMAX) 250 MG tablet Take 250 mg by mouth as directed. Patient not taking: Reported on 07/31/2022 07/18/22   [provider]  HYDROcodone-acetaminophen (NORCO/VICODIN) 5-325 MG tablet Take 1 tablet by mouth 4 (four) times daily as needed for pain. Patient not taking: Reported on 07/15/2022 06/27/22   [provider]  polyethylene glycol-electrolytes (TRILYTE) 420 g solution Take 4,000 mLs by mouth as directed. Patient not taking: Reported on 07/31/2022 07/15/22   Harvel Quale, MD  sucralfate (CARAFATE) 1 GM/10ML suspension Take 10 mLs (1 g total) by mouth 4 (four) times daily -  with meals and at bedtime for 14 days. Patient not taking: Reported on 07/31/2022 07/01/22 07/15/22  Roxan Hockey,  MD    Physical Exam: Vitals:   08/01/22 0100 08/01/22 0200 08/01/22 0216 08/01/22 0300  BP: (!) 117/57 119/83  (!) 111/53  Pulse: 92 88 89 84  Resp: '17 17 20 18  '$ Temp:      TempSrc:      SpO2: 98% 98% 98% 98%  Weight:      Height:       1.  General: Patient lying supine in bed,  no acute distress   2. Psychiatric: Alert and oriented x 3, mood and behavior normal for situation, pleasant and cooperative with exam   3. Neurologic: Speech and language are normal, face is symmetric, moves all 4 extremities voluntarily, at baseline without acute deficits on limited exam   4. HEENMT:  Head is atraumatic, normocephalic, pupils reactive to light, neck is supple, trachea is midline, mucous membranes are moist   5. Respiratory : Mild wheezing, without rhonchi, rales, no cyanosis, no increase in work of breathing or accessory muscle use, O2 cannula in place   6. Cardiovascular : Heart rate normal, rhythm is regular, no murmurs, rubs or gallops, no peripheral edema, peripheral pulses palpated   7. Gastrointestinal:  Abdomen is soft, nondistended, nontender to palpation bowel sounds active, no masses or organomegaly palpated   8. Skin:  Skin is warm, dry and intact without rashes, acute lesions, or ulcers on limited exam   9.Musculoskeletal:  No acute deformities or trauma, no asymmetry in tone, no peripheral edema, peripheral pulses palpated, no tenderness to palpation in the extremities  Data Reviewed: In the ED Temp 98.9, heart rate 41-126, respiratory rate 20-30, blood pressure 120/63-140/99, satting 94-100% Blood gas improved after being on BiPAP No leukocytosis with a white blood cell count of 4.2, hemoglobin 10.4, platelets 184 Chemistries unremarkable D-dimer elevated 1.46, CTA does not show PE Chest x-ray shows no active cardiopulmonary disease Heart rate initially 127, sinus tach, QTc 438, heart rate has normalized after treating for COPD exacerbation  Assessment and  Plan: * COPD exacerbation ([place]) - With tachypnea, hypoxia, hypercapnia at presentation -CTA is negative for acute PE.  Emphysema.  Bronchiectasis at the lingula of the left lower lobe with bronchial wall thickening and tree-in-bud nodularity as seen on multiple prior exams.  No consolidative pneumonia.  Circumferential thickening of the esophagus as well with history of esophagitis -Chest x-ray showed no active disease -Continue Breo, Incruse and - Continue scheduled DuoNeb, as needed albuterol -Continue systemic steroids -Patient reports that  she already feels significantly better after breathing treatments and BiPAP thus far  GERD (gastroesophageal reflux disease) - Continue Protonix  Acute on chronic respiratory failure with hypoxia and hypercapnia (HCC) - Initial VBG showing less than 31 PO2, PCO2 of 60, repeat blood gas shows PCO2 of 49, PO2 82 -Patient was on BiPAP during the interval between the 2 blood gases -Currently off BiPAP and will likely be able to transfer out of the ICU today -Secondary to COPD exacerbation -Patient does wear 3 L nasal cannula chronically -See treatment for COPD exacerbation  Mixed hyperlipidemia - Continue statin  Hypertension - Continue Norvasc      Advance Care Planning:   Code Status: Full Code   Consults: None  Family Communication: No family at bedside  Severity of Illness: The appropriate patient status for this patient is INPATIENT. Inpatient status is judged to be reasonable and necessary in order to provide the required intensity of service to ensure the patient's safety. The patient's presenting symptoms, physical exam findings, and initial radiographic and laboratory data in the context of their chronic comorbidities is felt to place them at high risk for further clinical deterioration. Furthermore, it is not anticipated that the patient will be medically stable for discharge from the hospital within 2 midnights of admission.   * I  certify that at the point of admission it is my clinical judgment that the patient will require inpatient hospital care spanning beyond 2 midnights from the point of admission due to high intensity of service, high risk for further deterioration and high frequency of surveillance required.*  Author: Rolla Plate, DO 08/01/2022 3:46 AM  For on call review [email or web address].

## 2022-08-01 NOTE — TOC Initial Note (Signed)
Transition of Care [facility]) - Initial/Assessment Note    Patient Details  Name: [name] MRN: [id number] Date of Birth: 05-Jun-1956  [facility] CM/SW Contact:    Iona Beard, [place] Phone Number: 08/01/2022, 3:00 PM  Clinical Narrative:                 Pt is high risk for readmission. CSW spoke with pt in room to complete assessment. Pt states that her daughter lives with her. Pt states he daughter assists her with her ADLs and provides her transportation when needed. Pt has had HH years ago and is interested in this again. Pt states she does not have a [place] agency preference. CSW reached out to [facility] as they are in network with pts insurance. CSW awaiting updated on if referral is accepted. Pt uses a walker and a wheelchair when needed. TOC to follow.   Expected Discharge Plan: [place] Barriers to Discharge: Continued Medical Work up   Patient Goals and CMS Choice Patient states their goals for this hospitalization and ongoing recovery are:: return home CMS Medicare.gov Compare Post Acute Care list provided to:: Patient Choice offered to / list presented to : Patient  Expected Discharge Plan and Services Expected Discharge Plan: [place] In-house Referral: Clinical Social Work Discharge Planning Services: CM Consult Post Acute Care Choice: [place] arrangements for the past 2 months: Single Family Home                                      Prior Living Arrangements/Services Living arrangements for the past 2 months: Single Family Home Lives with:: Adult Children Patient language and need for interpreter reviewed:: Yes Do you feel safe going back to the place where you live?: Yes      Need for Family Participation in Patient Care: Yes (Comment) Care giver support system in place?: Yes (comment) Current home services: DME Criminal Activity/Legal Involvement Pertinent to Current  Situation/Hospitalization: No - Comment as needed  Activities of Daily Living Home Assistive Devices/Equipment: None ADL Screening (condition at time of admission) Patient's cognitive ability adequate to safely complete daily activities?: Yes Is the patient deaf or have difficulty hearing?: No Does the patient have difficulty seeing, even when wearing glasses/contacts?: No Does the patient have difficulty concentrating, remembering, or making decisions?: No Patient able to express need for assistance with ADLs?: No Does the patient have difficulty dressing or bathing?: No Independently performs ADLs?: Yes (appropriate for developmental age) Does the patient have difficulty walking or climbing stairs?: No Weakness of Legs: Both Weakness of Arms/Hands: None  Permission Sought/Granted                  Emotional Assessment Appearance:: Appears stated age Attitude/Demeanor/Rapport: Engaged Affect (typically observed): Accepting Orientation: : Oriented to Self, Oriented to Place, Oriented to  Time, Oriented to Situation Alcohol / Substance Use: Not Applicable Psych Involvement: No (comment)  Admission diagnosis:  COPD exacerbation ([place]) [J44.1] Patient Active Problem List   Diagnosis Date Noted   COPD exacerbation ([place]) 07/31/2022   GERD (gastroesophageal reflux disease) 07/15/2022   ABLA (acute blood loss anemia)    Esophagitis 06/28/2022   Hematochezia 05/03/2022   E coli bacteremia 03/11/2022   Hypokalemia 03/11/2022   Hyponatremia 03/10/2022   Thrombocytopenia ([place]) 03/10/2022   Acute on chronic respiratory failure with hypoxia and hypercapnia (HCC)  12/30/2021   Deficiency anemia 05/23/2021   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   Sepsis due to undetermined organism ([place]) 12/18/2020   Lobar pneumonia ([place]) 12/18/2020   Syncope 12/17/2020   Genetic testing 08/01/2020   Ovarian cancer ([place]) 07/07/2020   Port-A-Cath in place 07/03/2020   Family  history of prostate cancer 07/03/2020   Goals of care, counseling/discussion 07/01/2020   Peritoneal carcinomatosis ([place]) 06/06/2020   Pneumonia 09/13/2018   Acute on chronic respiratory failure ([place]) 01/22/2018   CAP (community acquired pneumonia) 01/18/2016   Anemia, iron deficiency 11/07/2015   Chronic respiratory failure with hypoxia ([place]) 11/07/2015   Acute on chronic respiratory failure with hypoxia ([place]) 11/07/2015   Mixed hyperlipidemia 11/07/2015   COPD (chronic obstructive pulmonary disease) ([place]) 11/06/2015   Hypertension 11/06/2015   PCP:  [name], MD Pharmacy:   [place], [place] [facility] [id number] [place] [address] Phone: [phone] Fax: [phone]     Social Determinants of Health (SDOH) Interventions Housing Interventions: Intervention Not Indicated  Readmission Risk Interventions    08/01/2022    2:58 PM 03/11/2022    7:53 AM 12/20/2020    1:27 PM  Readmission Risk Prevention Plan  Transportation Screening Complete Complete Complete  HRI or [place]  Complete Complete  Social Work Consult for [place] Planning/Counseling  Complete Complete  Palliative Care Screening  Not Applicable Not Applicable  Medication Review Press photographer) Complete Complete Complete  HRI or [place] Complete    SW Recovery Care/Counseling Consult Complete    [place] Not Applicable

## 2022-08-01 NOTE — Progress Notes (Signed)
Patient doing well off BIPAP. Will give scheduled breathing treatment at 0200. Patient in no distress and states she feels better.

## 2022-08-01 NOTE — Hospital Course (Signed)
[name] is a 66 y.o. female with medical history significant of COPD, hyperlipidemia, hypertension, chronic respiratory failure on 3 L nasal cannula, and more presents the ED with a chief complaint of dyspnea.  Patient reports she had gradual onset of dyspnea starting around the morning of the 13th when she woke up.  Patient reports she has had an associated dry cough.  Her dyspnea is worse with exertion.  She does have orthopnea but that has been ongoing for years.  Patient reports a tightness in her chest in the center of her chest.  Did not feel like a pain, or pressure, just felt like she could not take a deep breath in.  All of her symptoms feel better at this time.  Patient reports that the BiPAP and breathing treatments helped her symptoms.  She has had no fever at home, no sick contacts, no new environmental exposures.  She reports that her COPD seems to be flaring up every month.  She takes steroids and antibiotics and feels better and when they wear off she has the same symptoms.  She reports last time she saw her doctor for the symptoms was in August.  Patient reports she is using her rescue inhaler 3 times a day.  On review of systems she does report a nosebleed a couple days ago that has not been recurrent.   Patient does not smoke, does not drink, does not use illicit drugs.  She is vaccinated for COVID.  Patient is full code.

## 2022-08-01 NOTE — Assessment & Plan Note (Signed)
Continue Norvasc

## 2022-08-01 NOTE — Assessment & Plan Note (Signed)
Continue Protonix °

## 2022-08-01 NOTE — Progress Notes (Signed)
PROGRESS NOTE    Patient: [name]                            PCP: [name], MD                    DOB: 1956-01-05            DOA: 07/31/2022 GBT:[id number]             DOS: 08/01/2022, 11:49 AM   LOS: 1 day   Date of Service: The patient was seen and examined on 08/01/2022  Subjective:   The patient was seen and examined this morning. Hemodynamically stable still requiring up to 3 L of oxygen, satting 100% Tachypneic with respiratory rate of 26, tachycardic with heart rate 109   Brief Narrative:   [name] is a 66 y.o. female with medical history significant of COPD, hyperlipidemia, hypertension, chronic respiratory failure on 3 L nasal cannula, and more presents the ED with a chief complaint of dyspnea.  Patient reports she had gradual onset of dyspnea starting around the morning of the 13th when she woke up.  Patient reports she has had an associated dry cough.  Her dyspnea is worse with exertion.  She does have orthopnea but that has been ongoing for years.  Patient reports a tightness in her chest in the center of her chest.  Did not feel like a pain, or pressure, just felt like she could not take a deep breath in.  All of her symptoms feel better at this time.  Patient reports that the BiPAP and breathing treatments helped her symptoms.  She has had no fever at home, no sick contacts, no new environmental exposures.  She reports that her COPD seems to be flaring up every month.  She takes steroids and antibiotics and feels better and when they wear off she has the same symptoms.  She reports last time she saw her doctor for the symptoms was in August.  Patient reports she is using her rescue inhaler 3 times a day.  On review of systems she does report a nosebleed a couple days ago that has not been recurrent.   Patient does not smoke, does not drink, does not use illicit drugs.  She is vaccinated for COVID.  Patient is full code.    Assessment & Plan:   Principal  Problem:   COPD exacerbation ([place]) Active Problems:   Hypertension   Mixed hyperlipidemia   Acute on chronic respiratory failure with hypoxia and hypercapnia (HCC)   GERD (gastroesophageal reflux disease)     Assessment and Plan: * COPD exacerbation ([place]) -Satting 100% now on 3 L of oxygen, baseline -With minimal exertion patient developed shortness of breath and hypoxia  -Presented with tachypnea, hypoxia, hypercapnia -CTA is negative for acute PE.  Emphysema.  Bronchiectasis at the lingula of the left lower lobe with bronchial wall thickening and tree-in-bud nodularity as seen on multiple prior exams.  No consolidative pneumonia.  Circumferential thickening of the esophagus as well with history of esophagitis -Chest x-ray showed no active disease -Continue Breo, Incruse and - Continue scheduled DuoNeb, as needed albuterol -Continue systemic steroids   -Patient has been weaned off BiPAP -Appreciate pulmonology Dr. Gustavus Bryant input   -Patient reports that she already feels significantly better after breathing treatments and BiPAP thus far  GERD (gastroesophageal reflux disease) - Continue Protonix  Acute on chronic respiratory failure  with hypoxia and hypercapnia (HCC) -Shortness of breath he was improved at rest, still having shortness of breath with exertion  -Currently addressed on 3 L of oxygen, satting 100%  -On arrival:  initial VBG showing less than 31 PO2, PCO2 of 60, repeat blood gas shows PCO2 of 49, PO2 82 -Patient was on BiPAP during the interval between the 2 blood gases -Patient was observed in ICU overnight, currently has improved  -Secondary to COPD exacerbation -Patient does wear 3 L nasal cannula chronically  -Continuing underlying cause - COPD exacerbation -Pulmonary Dr. [name] has been consulted  Mixed hyperlipidemia - Continue statin  Hypertension - Continue  Norvasc     ----------------------------------------------------------------------------------------------------------------------------------------------- Nutritional status:  The patient's BMI is: Body mass index is 22.91 kg/m. I agree with the assessment and plan as outlined below: Nutrition Status:        ---------------------------------------------------------------------------------------------------------------------------------------------  DVT prophylaxis:  heparin injection 5,000 Units Start: 08/01/22 0600 SCDs Start: 07/31/22 2309   Code Status:   Code Status: Full Code  Family Communication: No family member present at bedside- attempt will be made to update daily The above findings and plan of care has been discussed with patient (and family)  in detail,  they expressed understanding and agreement of above. -Advance care planning has been discussed.   Admission status:   Status is: Inpatient Remains inpatient appropriate because: In acute respiratory failure, needing supplemental oxygen, DuoNeb bronchodilator treatments input from specialist pulmonology      Procedures:   No admission procedures for hospital encounter.   Antimicrobials:  Anti-infectives (From admission, onward)    Start     Dose/Rate Route Frequency Ordered Stop   08/01/22 1000  azithromycin (ZITHROMAX) tablet 500 mg        500 mg Oral Daily 08/01/22 0711          Medication:   aspirin EC  81 mg Oral Q breakfast   atorvastatin  40 mg Oral Daily   azithromycin  500 mg Oral Daily   budesonide (PULMICORT) nebulizer solution  0.25 mg Nebulization BID   Chlorhexidine Gluconate Cloth  6 each Topical Daily   fluticasone furoate-vilanterol  1 puff Inhalation Daily   And   umeclidinium bromide  1 puff Inhalation Daily   guaiFENesin-dextromethorphan  10 mL Oral Q8H   heparin  5,000 Units Subcutaneous Q8H   ipratropium-albuterol  3 mL Nebulization Q6H   nortriptyline  25 mg Oral QHS    mouth rinse  15 mL Mouth Rinse 4 times per day   pantoprazole  40 mg Oral Daily   [START ON 08/02/2022] predniSONE  40 mg Oral Q breakfast   primidone  50 mg Oral QHS    acetaminophen **OR** acetaminophen, levalbuterol, morphine injection, ondansetron **OR** ondansetron (ZOFRAN) IV, mouth rinse, oxyCODONE   Objective:   Vitals:   08/01/22 0800 08/01/22 0806 08/01/22 0900 08/01/22 1127  BP: (!) 95/56 (!) 95/56 106/75   Pulse: 95 93 (!) 102 (!) 109  Resp: 19 16 (!) 24 (!) 26  Temp:    98 F (36.7 C)  TempSrc:    Oral  SpO2: 97% 99% 99% 100%  Weight:      Height:       No intake or output data in the 24 hours ending 08/01/22 1149 Filed Weights   07/31/22 1420 07/31/22 2328 08/01/22 0500  Weight: 53.5 kg 53.2 kg 53.2 kg     Examination:   Physical Exam  Constitution:  Alert, cooperative, no distress,  Appears calm  and comfortable  Psychiatric:   Normal and stable mood and affect, cognition intact,   HEENT:        Normocephalic, PERRL, otherwise with in Normal limits  Chest:         Chest symmetric Cardio vascular:  S1/S2, RRR, No murmure, No Rubs or Gallops  pulmonary: Shortness of breath with exertion, improved diffuse wheezing, negative crackles, mild rhonchi on right lower lobes Abdomen: Soft, non-tender, non-distended, bowel sounds,no masses, no organomegaly Muscular skeletal: Limited exam - in bed, able to move all 4 extremities,   Neuro: CNII-XII intact. , normal motor and sensation, reflexes intact  Extremities: No pitting edema lower extremities, +2 pulses  Skin: Dry, warm to touch, negative for any Rashes, No open wounds Wounds: per nursing documentation   ------------------------------------------------------------------------------------------------------------------------------------------    LABs:     Latest Ref Rng & Units 08/01/2022    3:09 AM 07/31/2022    4:22 PM 07/15/2022   10:44 AM  CBC  WBC 4.0 - 10.5 K/uL 3.3  4.2  5.8   Hemoglobin 12.0 -  15.0 g/dL 9.4  10.4  10.8   Hematocrit 36.0 - 46.0 % 29.1  31.7  31.9   Platelets 150 - 400 K/uL 168  184  236       Latest Ref Rng & Units 08/01/2022    3:09 AM 07/31/2022    4:22 PM 07/01/2022    5:30 AM  CMP  Glucose 70 - 99 mg/dL 171  115  105   BUN 8 - 23 mg/dL '10  8  5   '$ Creatinine 0.44 - 1.00 mg/dL 0.64  0.74  0.48   Sodium 135 - 145 mmol/L 135  137  136   Potassium 3.5 - 5.1 mmol/L 3.7  3.5  3.5   Chloride 98 - 111 mmol/L 97  93  100   CO2 22 - 32 mmol/L 30  33  28   Calcium 8.9 - 10.3 mg/dL 8.7  9.0  8.0   Total Protein 6.5 - 8.1 g/dL 6.6     Total Bilirubin 0.3 - 1.2 mg/dL 0.5     Alkaline Phos 38 - 126 U/L 88     AST 15 - 41 U/L 24     ALT 0 - 44 U/L 10          Micro Results Recent Results (from the past 240 hour(s))  Resp Panel by RT-PCR (Flu A&B, Covid) Anterior Nasal Swab     Status: None   Collection Time: 07/31/22  5:35 PM   Specimen: Anterior Nasal Swab  Result Value Ref Range Status   SARS Coronavirus 2 by RT PCR NEGATIVE NEGATIVE Final    Comment: (NOTE) SARS-CoV-2 target nucleic acids are NOT DETECTED.  The SARS-CoV-2 RNA is generally detectable in upper respiratory specimens during the acute phase of infection. The lowest concentration of SARS-CoV-2 viral copies this assay can detect is 138 copies/mL. A negative result does not preclude SARS-Cov-2 infection and should not be used as the sole basis for treatment or other patient management decisions. A negative result may occur with  improper specimen collection/handling, submission of specimen other than nasopharyngeal swab, presence of viral mutation(s) within the areas targeted by this assay, and inadequate number of viral copies(<138 copies/mL). A negative result must be combined with clinical observations, patient history, and epidemiological information. The expected result is Negative.  Fact Sheet for Patients:  EntrepreneurPulse.com.au  Fact Sheet for Healthcare  Providers:  IncredibleEmployment.be  This test is no  t yet approved or cleared by the [place] and  has been authorized for detection and/or diagnosis of SARS-CoV-2 by FDA under an Emergency Use Authorization (EUA). This EUA will remain  in effect (meaning this test can be used) for the duration of the COVID-19 declaration under Section 564(b)(1) of the Act, 21 U.S.C.section 360bbb-3(b)(1), unless the authorization is terminated  or revoked sooner.       Influenza A by PCR NEGATIVE NEGATIVE Final   Influenza B by PCR NEGATIVE NEGATIVE Final    Comment: (NOTE) The Xpert Xpress SARS-CoV-2/FLU/RSV plus assay is intended as an aid in the diagnosis of influenza from Nasopharyngeal swab specimens and should not be used as a sole basis for treatment. Nasal washings and aspirates are unacceptable for Xpert Xpress SARS-CoV-2/FLU/RSV testing.  Fact Sheet for Patients: EntrepreneurPulse.com.au  Fact Sheet for Healthcare Providers: IncredibleEmployment.be  This test is not yet approved or cleared by the [place] FDA and has been authorized for detection and/or diagnosis of SARS-CoV-2 by FDA under an Emergency Use Authorization (EUA). This EUA will remain in effect (meaning this test can be used) for the duration of the COVID-19 declaration under Section 564(b)(1) of the Act, 21 U.S.C. section 360bbb-3(b)(1), unless the authorization is terminated or revoked.  Performed at [facility], [address] [place], [name] [address]   MRSA Next Gen by PCR, Nasal     Status: None   Collection Time: 07/31/22 11:09 PM   Specimen: Nasal Mucosa; Nasal Swab  Result Value Ref Range Status   MRSA by PCR Next Gen NOT DETECTED NOT DETECTED Final    Comment: (NOTE) The GeneXpert MRSA Assay (FDA approved for NASAL specimens only), is one component of a comprehensive MRSA colonization surveillance program. It is not intended to  diagnose MRSA infection nor to guide or monitor treatment for MRSA infections. Test performance is not FDA approved in patients less than 35 years old. Performed at [facility], [address], [place],  [address]     Radiology Reports CT Angio Chest PE W and/or Wo Contrast  Result Date: 07/31/2022 CLINICAL DATA:  Chest tightness short of breath EXAM: CT ANGIOGRAPHY CHEST WITH CONTRAST TECHNIQUE: Multidetector CT imaging of the chest was performed using the standard protocol during bolus administration of intravenous contrast. Multiplanar CT image reconstructions and MIPs were obtained to evaluate the vascular anatomy. RADIATION DOSE REDUCTION: This exam was performed according to the departmental dose-optimization program which includes automated exposure control, adjustment of the mA and/or kV according to patient size and/or use of iterative reconstruction technique. CONTRAST:  73m OMNIPAQUE IOHEXOL 350 MG/ML SOLN COMPARISON:  Chest x-ray 07/31/2022, CT chest 06/28/2022, 05/03/2022, 10/30/2021 FINDINGS: Cardiovascular: Satisfactory opacification of the pulmonary arteries to the segmental level. No evidence of pulmonary embolism. Moderate aortic atherosclerosis. No aneurysm or dissection. Mild coronary vascular calcification. Left-sided central venous catheter with tip at the cavoatrial region. Mediastinum/Nodes: Midline trachea. No thyroid mass. No suspicious lymph nodes. Mild circumferential thickening of the distal esophagus with small hiatal hernia. Lungs/Pleura: Emphysema with probable scarring at the apices. Bronchiectasis within the lingula and left lower lobe. Bronchial wall thickening most evident at the left lower lobe. Patchy mucous plugging. Tree-in-bud density within the anterior left upper lobe, subpleural right lower lobe and left lower lobe. No interval consolidation, pleural effusion, or pneumothorax. Upper Abdomen: No acute abnormality. Musculoskeletal: No chest wall  abnormality. No acute or significant osseous findings. Review of the MIP images confirms the above findings. IMPRESSION: 1. Negative for acute  pulmonary embolus or aortic dissection 2. Emphysema. Bronchiectasis at the lingula and left lower lobe with bronchial wall thickening and tree-in-bud nodularity as seen on multiple priors and likely due to chronic atypical infection. No interval consolidative pneumonia. 3. Circumferential thickening of the distal esophagus with small hiatal hernia, this may be correlated with endoscopy if not already performed. Aortic Atherosclerosis (ICD10-I70.0) and Emphysema (ICD10-J43.9). Electronically Signed   By: Donavan Foil M.D.   On: 07/31/2022 21:18   DG Chest 2 View  Result Date: 07/31/2022 CLINICAL DATA:  Increasing shortness of breath over the last 2-3 months with a new nonproductive cough EXAM: CHEST - 2 VIEW COMPARISON:  Chest radiograph 07/17/2022 FINDINGS: No pleural effusion. No pneumothorax. Unchanged cardiac and mediastinal contours. No new focal airspace opacity. Chronic emphysematous change and bibasilar bronchiectasis again noted. Left chest wall port with the tip in the lower SVC. No acute osseous abnormality. IMPRESSION: No active cardiopulmonary disease. Electronically Signed   By: Marin Roberts M.D.   On: 07/31/2022 15:10    SIGNED: Deatra James, MD, FHM. [facility],  Pager (please use [email or web address] to page/text) Please use Epic Secure Chat for non-urgent communication (7AM-7PM)  If 7PM-7AM, please contact night-coverage [email or web address], 08/01/2022, 11:49 AM

## 2022-08-01 NOTE — Consult Note (Signed)
NAME:  [name], MRN:  [id number], DOB:  03/02/1956, LOS: 1 ADMISSION DATE:  07/31/2022, CONSULTATION DATE:  08/01/22 REFERRING MD: [facility], CHIEF COMPLAINT:  aecopd    History of Present Illness:   72 yowf quit smoking around 2013/ pt of [name] with copd GOLD 4  criteria and housbound at [place]/ 02 dep / pred dep at 5 mg with freq exac and "just calls for appt" when flares. Typical flare in seting of increasing cough and congestion x "a few days" and says called for appt with [name] but couldn't see her for a few days and then much worse am 9/13 and admit with chest tightness, sob at rest and needed bipap overnight and PCCM consulted am 9/14   Patient does not smoke, does not drink, does not use illicit drugs.  She is vaccinated for COVID.  Patient is full code.     Significant Hospital Events: Including procedures, antibiotic start and stop dates in addition to other pertinent events      Scheduled Meds:  aspirin EC  81 mg Oral Q breakfast   atorvastatin  40 mg Oral Daily   azithromycin  500 mg Oral Daily   budesonide (PULMICORT) nebulizer solution  0.25 mg Nebulization BID   Chlorhexidine Gluconate Cloth  6 each Topical Daily   fluticasone furoate-vilanterol  1 puff Inhalation Daily   And   umeclidinium bromide  1 puff Inhalation Daily   guaiFENesin-dextromethorphan  10 mL Oral Q8H   heparin  5,000 Units Subcutaneous Q8H   ipratropium-albuterol  3 mL Nebulization Q6H   methylPREDNISolone (SOLU-MEDROL) injection  125 mg Intravenous Q12H   Followed by   Derrill Memo ON 08/02/2022] predniSONE  40 mg Oral Q breakfast   nortriptyline  25 mg Oral QHS   mouth rinse  15 mL Mouth Rinse 4 times per day   pantoprazole  40 mg Oral Daily   primidone  50 mg Oral QHS   Continuous Infusions: PRN Meds:.acetaminophen **OR** acetaminophen, levalbuterol, morphine injection, ondansetron **OR** ondansetron (ZOFRAN) IV, mouth rinse, oxyCODONE    Interim History / Subjective:  Better this  am/ mildly congested sounding cough   Objective   Blood pressure 106/75, pulse (!) 102, temperature 97.8 F (36.6 C), temperature source Oral, resp. rate (!) 24, height 5' (1.524 m), weight 53.2 kg, SpO2 99 %.       No intake or output data in the 24 hours ending 08/01/22 1000 Filed Weights   07/31/22 1420 07/31/22 2328 08/01/22 0500  Weight: 53.5 kg 53.2 kg 53.2 kg    Examination: Tmax:  98.9 General appearance:    elderly wf comfortable at 45 degrees HOB  At Rest 02 sats  99% on 3lpm   No jvd Oropharynx clear,  mucosa nl Neck supple Lungs with very disttant cattered exp > insp rhonchi bilaterally RRR no s3 or or sign murmur Abd soft/ pos hoover's  early insp  Extr warm with no edema or clubbing noted Neuro  Sensorium intact,  no apparent motor deficits     I personally reviewed images and agree with radiology impression as follows:   Chest CTa 9/13 1. Negative for acute pulmonary embolus or aortic dissection 2. Emphysema. Bronchiectasis at the lingula and left lower lobe with bronchial wall thickening and tree-in-bud nodularity as seen on multiple priors and likely due to chronic atypical infection. No interval consolidative pneumonia. 3. Circumferential thickening of the distal esophagus with small hiatal hernia, this may be correlated with endoscopy if not  already performed.   Aortic Atherosclerosis (ICD10-I70.0) and Emphysema    Assessment & Plan:  1)  Acute on chronic hypoxemic and hypercarbic resp failure secondary to AECOPD in pt with GOLD IV / group E symptoms/ risk > already on max rx and nothing to offer for this admit.  However, talking to her she doesn't have a good action plan for exacerbations given she is already steroid dependent at baseline and I have offered to try to help with this once she is discharged   Nothing else to offer at this point/ would try off the bipap for now so she does not become dependent on it but if she can't be maintained off  bipap, then NIV could be considered for home use.   2) anemia normocytic ? Etiology / certainly adding to limited resp reserve status >>> rx per [facility]   Lab Results  Component Value Date   HGB 9.4 (L) 08/01/2022   HGB 10.4 (L) 07/31/2022   HGB 10.8 (L) 07/15/2022   HGB 10.6 (L) 11/07/2014   HGB 10.5 (L) 10/18/2014   HGB 11.1 (L) 07/19/2014     Best Practice (right click and "Reselect all SmartList Selections" daily)   Per [facility]   Labs   CBC: Recent Labs  Lab 07/31/22 1622 08/01/22 0309  WBC 4.2 3.3*  NEUTROABS  --  3.1  HGB 10.4* 9.4*  HCT 31.7* 29.1*  MCV 96.4 96.4  PLT 184 782    Basic Metabolic Panel: Recent Labs  Lab 07/31/22 1622 08/01/22 0309  NA 137 135  K 3.5 3.7  CL 93* 97*  CO2 33* 30  GLUCOSE 115* 171*  BUN 8 10  CREATININE 0.74 0.64  CALCIUM 9.0 8.7*  MG 1.8 2.1   GFR: Estimated Creatinine Clearance: 51.7 mL/min (by C-G formula based on SCr of 0.64 mg/dL). Recent Labs  Lab 07/31/22 1622 08/01/22 0309  WBC 4.2 3.3*    Liver Function Tests: Recent Labs  Lab 08/01/22 0309  AST 24  ALT 10  ALKPHOS 88  BILITOT 0.5  PROT 6.6  ALBUMIN 3.4*   No results for input(s): "LIPASE", "AMYLASE" in the last 168 hours. No results for input(s): "AMMONIA" in the last 168 hours.  ABG    Component Value Date/Time   PHART 7.45 07/31/2022 2109   PCO2ART 49 (H) 07/31/2022 2109   PO2ART 82 (L) 07/31/2022 2109   HCO3 33.4 (H) 08/01/2022 0500   ACIDBASEDEF 2.1 (H) 01/18/2016 1250   O2SAT 79.5 08/01/2022 0500     Coagulation Profile: No results for input(s): "INR", "PROTIME" in the last 168 hours.  Cardiac Enzymes: No results for input(s): "CKTOTAL", "CKMB", "CKMBINDEX", "TROPONINI" in the last 168 hours.  HbA1C: Hemoglobin A1C  Date/Time Value Ref Range Status  11/01/2013 05:58 AM 5.6 4.2 - 6.3 % Final    Comment:    The American Diabetes Association recommends that a primary goal of therapy should be <7% and that physicians should  reevaluate the treatment regimen in patients with HbA1c values consistently >8%.     CBG: No results for input(s): "GLUCAP" in the last 168 hours.     Past Medical History:  She,  has a past medical history of Anemia, Aortic atherosclerosis ([place]), Arthritis, Asthma, Bilateral carotid artery stenosis, Cancer ([place]), COPD (chronic obstructive pulmonary disease) ([place]), High cholesterol, Hyperlipidemia, Hypertension, Neuropathy, Osteoarthritis, Ovarian cancer ([place]), Oxygen dependent, Peripheral vascular disease ([place]), Peritoneal carcinoma ([place]), Pneumonia (2019), Port-A-Cath in place (07/03/2020), and Pulmonary emphysema ([place]).   Surgical History:  Past Surgical History:  Procedure Laterality Date   BIOPSY  06/30/2022   Procedure: BIOPSY;  Surgeon: Eloise Harman, DO;  [facility];  Service: Endoscopy;;  gastric   ESOPHAGOGASTRODUODENOSCOPY (EGD) WITH PROPOFOL N/A 02/17/2021   Procedure: ESOPHAGOGASTRODUODENOSCOPY (EGD) WITH PROPOFOL;  Surgeon: Harvel Quale, MD;  [facility];  Service: Gastroenterology;  Laterality: N/A;   ESOPHAGOGASTRODUODENOSCOPY (EGD) WITH PROPOFOL N/A 05/04/2022   Procedure: ESOPHAGOGASTRODUODENOSCOPY (EGD) WITH PROPOFOL;  Surgeon: Daneil Dolin, MD;  [facility];  Service: Endoscopy;  Laterality: N/A;   ESOPHAGOGASTRODUODENOSCOPY (EGD) WITH PROPOFOL N/A 06/30/2022   Procedure: ESOPHAGOGASTRODUODENOSCOPY (EGD) WITH PROPOFOL;  Surgeon: Eloise Harman, DO;  [facility];  Service: Endoscopy;  Laterality: N/A;   HALLUX VALGUS BASE WEDGE Right 06/09/2015   Procedure: Base wedge osteotomy with modified McBride right foot ;  Surgeon: Sharlotte Alamo, MD;  Location: [facility];  Service: Podiatry;  Laterality: Right;   PORTACATH PLACEMENT Left 06/28/2020   Procedure: PORT-A-CATHETER PLACEMENT LEFT CHEST (attached catheter in left subclavian);  Surgeon: Virl Cagey, MD;  Location: [facility];  Service: General;  Laterality:  Left;   TUBAL LIGATION     VIDEO BRONCHOSCOPY WITH ENDOBRONCHIAL NAVIGATION N/A 03/09/2021   Procedure: VIDEO BRONCHOSCOPY WITH ENDOBRONCHIAL NAVIGATION;  Surgeon: Ottie Glazier, MD;  Location: [facility];  Service: Thoracic;  Laterality: N/A;   VIDEO BRONCHOSCOPY WITH ENDOBRONCHIAL ULTRASOUND N/A 03/09/2021   Procedure: VIDEO BRONCHOSCOPY WITH ENDOBRONCHIAL ULTRASOUND;  Surgeon: Ottie Glazier, MD;  Location: [facility];  Service: Thoracic;  Laterality: N/A;     Social History:   reports that she quit smoking about 8 years ago. Her smoking use included cigarettes. She has a 60.00 pack-year smoking history. She has never used smokeless tobacco. She reports that she does not drink alcohol and does not use drugs.   Family History:  Her family history includes Alzheimer's disease in her maternal grandmother and mother; COPD in her father; Emphysema in her father; Healthy in her brother, sister, sister, sister, and sister; Hypertension in her father; Prostate cancer in an other family member. There is no history of Breast cancer.   Allergies No Known Allergies   Home Medications  Prior to Admission medications   Medication Sig Start Date End Date Taking? Authorizing Provider  Acetylcysteine (MUCOMYST-10 IN) Inhale 1 vial into the lungs every three (3) days as needed (copd).   Yes [provider]  albuterol (VENTOLIN HFA) 108 (90 Base) MCG/ACT inhaler Inhale 2 puffs into the lungs every 6 (six) hours as needed for wheezing or shortness of breath. 07/01/22  Yes Emokpae, Courage, MD  amLODipine (NORVASC) 5 MG tablet Take 5 mg by mouth at bedtime.    Yes [provider]  aspirin EC 81 MG tablet Take 1 tablet (81 mg total) by mouth daily with breakfast. 07/01/22  Yes Emokpae, Courage, MD  atorvastatin (LIPITOR) 40 MG tablet Take 1 tablet (40 mg total) by mouth daily. 01/02/22  Yes Dessa Phi, DO  ferrous sulfate 325 (65 FE) MG tablet Take 325 mg by mouth every other day.   Yes  [provider]  Fluticasone-Umeclidin-Vilant (TRELEGY ELLIPTA) 100-62.5-25 MCG/ACT AEPB Inhale 1 puff into the lungs daily at 2 PM. 07/01/22  Yes Emokpae, Courage, MD  ipratropium (ATROVENT HFA) 17 MCG/ACT inhaler Inhale 2 puffs into the lungs every 6 (six) hours.   Yes [provider]  levalbuterol Penne Lash) 1.25 MG/3ML nebulizer solution Inhale into the lungs. 09/28/21  Yes [provider]  magnesium oxide (MAG-OX) 400 (240 Mg) MG tablet Take 1 tablet by mouth twice daily Patient taking differently: Take 400 mg by mouth 2 (two) times daily. 03/04/22  Yes Derek Jack, MD  Niraparib Tosylate (ZEJULA) 200 MG TABS Take 1 tablet by mouth daily. 06/25/22  Yes Derek Jack, MD  nortriptyline (PAMELOR) 25 MG capsule Take 25 mg by mouth at bedtime.   Yes [provider]  OXYGEN Inhale 3-4 L into the lungs continuous.   Yes [provider]  pantoprazole (PROTONIX) 40 MG tablet Take 1 tablet (40 mg total) by mouth 2 (two) times daily. Patient taking differently: Take 40 mg by mouth daily. 07/01/22 09/29/22 Yes Emokpae, Courage, MD  predniSONE (DELTASONE) 5 MG tablet Take 5 mg by mouth daily with breakfast.   Yes [provider]  primidone (MYSOLINE) 50 MG tablet Take 50 mg by mouth at bedtime. 01/30/22  Yes [provider]  Tiotropium Bromide-Olodaterol (STIOLTO RESPIMAT) 2.5-2.5 MCG/ACT AERS Inhale 2 puffs into the lungs daily at 2 PM. 07/01/22  Yes Emokpae, Courage, MD  azithromycin (ZITHROMAX) 250 MG tablet Take 250 mg by mouth as directed. Patient not taking: Reported on 07/31/2022 07/18/22   [provider]  HYDROcodone-acetaminophen (NORCO/VICODIN) 5-325 MG tablet Take 1 tablet by mouth 4 (four) times daily as needed for pain. Patient not taking: Reported on 07/15/2022 06/27/22   [provider]  polyethylene glycol-electrolytes (TRILYTE) 420 g solution Take 4,000 mLs by mouth as directed. Patient not taking:  Reported on 07/31/2022 07/15/22   Harvel Quale, MD  sucralfate (CARAFATE) 1 GM/10ML suspension Take 10 mLs (1 g total) by mouth 4 (four) times daily -  with meals and at bedtime for 14 days. Patient not taking: Reported on 07/31/2022 07/01/22 07/15/22  Roxan Hockey, MD      Christinia Gully, MD Pulmonary and [place] [phone]   After 7:00 pm call Elink  [phone]

## 2022-08-01 NOTE — Assessment & Plan Note (Addendum)
-  Satting 95% now on 3 L of oxygen, baseline -With minimal exertion patient developed shortness of breath and hypoxia  -Presented with tachypnea, hypoxia, hypercapnia -CTA is negative for acute PE.  Emphysema.  Bronchiectasis at the lingula of the left lower lobe with bronchial wall thickening and tree-in-bud nodularity as seen on multiple prior exams.  No consolidative pneumonia.  Circumferential thickening of the esophagus as well with history of esophagitis -Chest x-ray showed no active disease -Continue Breo, Incruse and - Continue scheduled DuoNeb, as needed albuterol -Continue systemic steroids   -Patient has been weaned off BiPAP -Appreciate pulmonology Dr. [name]    -Respiratory distress much improved, weaned off BiPAP, currently on room air 3 L for past 24 hours

## 2022-08-02 ENCOUNTER — Other Ambulatory Visit (HOSPITAL_COMMUNITY): Payer: Self-pay

## 2022-08-02 DIAGNOSIS — J441 Chronic obstructive pulmonary disease with (acute) exacerbation: Secondary | ICD-10-CM | POA: Diagnosis not present

## 2022-08-02 LAB — CBC
HCT: 27.4 % — ABNORMAL LOW (ref 36.0–46.0)
Hemoglobin: 9 g/dL — ABNORMAL LOW (ref 12.0–15.0)
MCH: 31.5 pg (ref 26.0–34.0)
MCHC: 32.8 g/dL (ref 30.0–36.0)
MCV: 95.8 fL (ref 80.0–100.0)
Platelets: 185 10*3/uL (ref 150–400)
RBC: 2.86 MIL/uL — ABNORMAL LOW (ref 3.87–5.11)
RDW: 17.4 % — ABNORMAL HIGH (ref 11.5–15.5)
WBC: 5.4 10*3/uL (ref 4.0–10.5)
nRBC: 0 % (ref 0.0–0.2)

## 2022-08-02 LAB — BASIC METABOLIC PANEL
Anion gap: 9 (ref 5–15)
BUN: 12 mg/dL (ref 8–23)
CO2: 32 mmol/L (ref 22–32)
Calcium: 8.7 mg/dL — ABNORMAL LOW (ref 8.9–10.3)
Chloride: 96 mmol/L — ABNORMAL LOW (ref 98–111)
Creatinine, Ser: 0.62 mg/dL (ref 0.44–1.00)
GFR, Estimated: >60 mL/min (ref >60)
Glucose, Bld: 124 mg/dL — ABNORMAL HIGH (ref 70–99)
Potassium: 3.4 mmol/L — ABNORMAL LOW (ref 3.5–5.1)
Sodium: 137 mmol/L (ref 135–145)

## 2022-08-02 MED ORDER — METHYLPREDNISOLONE 4 MG PO TBPK: ORAL_TABLET | ORAL | 0 refills | Status: DC

## 2022-08-02 MED ORDER — AZITHROMYCIN 250 MG PO TABS: ORAL_TABLET | ORAL | 0 refills | Status: AC

## 2022-08-02 MED ORDER — MAGNESIUM SULFATE 2 GM/50ML IV SOLN
2.0000 g | Freq: Once | INTRAVENOUS | Status: AC
  Administered 2022-08-02: 2 g via INTRAVENOUS
  Filled 2022-08-02: qty 50

## 2022-08-02 MED ORDER — POTASSIUM CHLORIDE CRYS ER 20 MEQ PO TBCR
40.0000 meq | EXTENDED_RELEASE_TABLET | Freq: Once | ORAL | Status: AC
  Administered 2022-08-02: 40 meq via ORAL
  Filled 2022-08-02: qty 2

## 2022-08-02 MED ORDER — FUROSEMIDE 40 MG PO TABS
40.0000 mg | ORAL_TABLET | Freq: Every day | ORAL | Status: AC
  Administered 2022-08-02: 40 mg via ORAL
  Filled 2022-08-02: qty 1

## 2022-08-02 NOTE — TOC Transition Note (Signed)
Transition of Care [facility]) - CM/SW Discharge Note   Patient Details  Name: [name] MRN: [id number] Date of Birth: 05-Feb-1956  [facility] CM/SW Contact:  Iona Beard, [employer] Phone Number: 08/02/2022, 10:40 AM   Clinical Narrative:    CSW updated that pt will D/C home today. CSW updated Marjory Lies with [facility] of plan for D/C. MD placed [place] orders. Information added to pts AVS. TOC signing off.   Final next level of care: [place] Barriers to Discharge: Barriers Resolved   Patient Goals and CMS Choice Patient states their goals for this hospitalization and ongoing recovery are:: return home CMS Medicare.gov Compare Post Acute Care list provided to:: Patient Choice offered to / list presented to : Patient  Discharge Placement                       Discharge Plan and Services In-house Referral: Clinical Social Work Discharge Planning Services: CM Consult Post Acute Care Choice: Home Health                    HH Arranged: RN, PT [facility] Agency: [place] Date [place]: 08/02/22   Representative spoke with at [place]: [place] (SDOH) Interventions Housing Interventions: Intervention Not Indicated   Readmission Risk Interventions    08/01/2022    2:58 PM 03/11/2022    7:53 AM 12/20/2020    1:27 PM  Readmission Risk Prevention Plan  Transportation Screening Complete Complete Complete  HRI or Home Care Consult  Complete Complete  Social Work Consult for [place] Planning/Counseling  Complete Complete  Palliative Care Screening  Not Applicable Not Applicable  Medication Review Press photographer) Complete Complete Complete  HRI or Home Care Consult Complete    SW Recovery Care/Counseling Consult Complete    [place] Not Applicable

## 2022-08-02 NOTE — Progress Notes (Signed)
BIPAP pulled out of room because pt hasn't needed or used in over 24 hours. [employer] left in room in case needed later on.

## 2022-08-02 NOTE — Discharge Summary (Signed)
Physician Discharge Summary   Patient: [name] MRN: [id number] DOB: [birth year]  Admit date:     07/31/2022  Discharge date: 08/02/22  Discharge Physician: Deatra James   PCP: [name], MD   Recommendations at discharge:   Follow-up with a pulmonologist in 2-4 weeks Follow with a PCP in 2-6 weeks Continue inhalers as instructed, taper down steroids and empiric antibiotics Patient received flu shot, booster COVID vaccine soon as possible  Discharge Diagnoses: Principal Problem:   COPD exacerbation ([place]) Active Problems:   Hypertension   Mixed hyperlipidemia   Acute on chronic respiratory failure with hypoxia and hypercapnia (HCC)   GERD (gastroesophageal reflux disease)  Resolved Problems:   * No resolved hospital problems. *  Hospital Course: [name] is a 66 y.o. female with medical history significant of COPD, hyperlipidemia, hypertension, chronic respiratory failure on 3 L nasal cannula, and more presents the ED with a chief complaint of dyspnea.  Patient reports she had gradual onset of dyspnea starting around the morning of the 13th when she woke up.  Patient reports she has had an associated dry cough.  Her dyspnea is worse with exertion.  She does have orthopnea but that has been ongoing for years.  Patient reports a tightness in her chest in the center of her chest.  Did not feel like a pain, or pressure, just felt like she could not take a deep breath in.  All of her symptoms feel better at this time.  Patient reports that the BiPAP and breathing treatments helped her symptoms.  She has had no fever at home, no sick contacts, no new environmental exposures.  She reports that her COPD seems to be flaring up every month.  She takes steroids and antibiotics and feels better and when they wear off she has the same symptoms.  She reports last time she saw her doctor for the symptoms was in August.  Patient reports she is using her rescue inhaler 3 times a day.  On  review of systems she does report a nosebleed a couple days ago that has not been recurrent.   Patient does not smoke, does not drink, does not use illicit drugs.  She is vaccinated for COVID.  Patient is full code.  Assessment and Plan: * COPD exacerbation ([place]) -Satting 95% now on 3 L of oxygen, baseline -With minimal exertion patient developed shortness of breath and hypoxia  -Presented with tachypnea, hypoxia, hypercapnia -CTA is negative for acute PE.  Emphysema.  Bronchiectasis at the lingula of the left lower lobe with bronchial wall thickening and tree-in-bud nodularity as seen on multiple prior exams.  No consolidative pneumonia.  Circumferential thickening of the esophagus as well with history of esophagitis -Chest x-ray showed no active disease -Continue Breo, Incruse and - Continue scheduled DuoNeb, as needed albuterol -Continue systemic steroids   -Patient has been weaned off BiPAP -Appreciate pulmonology Dr. [name]    -Respiratory distress much improved, weaned off BiPAP, currently on room air 3 L for past 24 hours  GERD (gastroesophageal reflux disease) - Continue Protonix  Acute on chronic respiratory failure with hypoxia and hypercapnia (HCC) -Shortness of breath he was improved  -Mild shortness of breath with exertion  -Back to baseline at 3 L of oxygen, satting 100%  -On arrival:  initial VBG showing less than 31 PO2, PCO2 of 60, repeat blood gas shows PCO2 of 49, PO2 82 -Patient was on BiPAP during the interval between the 2 blood gases -  Patient was observed in ICU overnight, currently has improved  -Secondary to COPD exacerbation -Patient does wear 3 L nasal cannula chronically  -Continuing underlying cause - COPD exacerbation -Pulmonary Dr. [name] has been consulted... Close follow-up as an outpatient  Mixed hyperlipidemia - Continue statin  Hypertension - Continue Norvasc     Consultants: Pulmonologist  Disposition: Home Diet recommendation:   Discharge Diet Orders (From admission, onward)     Start     Ordered   08/02/22 0000  Diet - low sodium heart healthy        08/02/22 0800           Cardiac diet DISCHARGE MEDICATION: Allergies as of 08/02/2022   No Known Allergies      Medication List     STOP taking these medications    HYDROcodone-acetaminophen 5-325 MG tablet Commonly known as: NORCO/VICODIN   predniSONE 5 MG tablet Commonly known as: DELTASONE   sucralfate 1 GM/10ML suspension Commonly known as: CARAFATE       TAKE these medications    albuterol 108 (90 Base) MCG/ACT inhaler Commonly known as: VENTOLIN HFA Inhale 2 puffs into the lungs every 6 (six) hours as needed for wheezing or shortness of breath.   amLODipine 5 MG tablet Commonly known as: NORVASC Take 5 mg by mouth at bedtime.   aspirin EC 81 MG tablet Take 1 tablet (81 mg total) by mouth daily with breakfast.   atorvastatin 40 MG tablet Commonly known as: LIPITOR Take 1 tablet (40 mg total) by mouth daily.   azithromycin 250 MG tablet Commonly known as: Zithromax Z-Pak Take 2 tablets (500 mg) on  Day 1,  followed by 1 tablet (250 mg) once daily on Days 2 through 5. What changed:  how much to take how to take this when to take this additional instructions   ferrous sulfate 325 (65 FE) MG tablet Take 325 mg by mouth every other day.   ipratropium 17 MCG/ACT inhaler Commonly known as: ATROVENT HFA Inhale 2 puffs into the lungs every 6 (six) hours.   levalbuterol 1.25 MG/3ML nebulizer solution Commonly known as: XOPENEX Inhale into the lungs.   magnesium oxide 400 (240 Mg) MG tablet Commonly known as: MAG-OX Take 1 tablet by mouth twice daily   methylPREDNISolone 4 MG Tbpk tablet Commonly known as: MEDROL DOSEPAK Medrol Dosepak take as instructed   MUCOMYST-10 IN Inhale 1 vial into the lungs every three (3) days as needed (copd).   nortriptyline 25 MG capsule Commonly known as: PAMELOR Take 25 mg by mouth  at bedtime.   OXYGEN Inhale 3-4 L into the lungs continuous.   pantoprazole 40 MG tablet Commonly known as: Protonix Take 1 tablet (40 mg total) by mouth 2 (two) times daily. What changed: when to take this   polyethylene glycol-electrolytes 420 g solution Commonly known as: TriLyte Take 4,000 mLs by mouth as directed.   primidone 50 MG tablet Commonly known as: MYSOLINE Take 50 mg by mouth at bedtime.   Stiolto Respimat 2.5-2.5 MCG/ACT Aers Generic drug: Tiotropium Bromide-Olodaterol Inhale 2 puffs into the lungs daily at 2 PM.   Trelegy Ellipta 100-62.5-25 MCG/ACT Aepb Generic drug: Fluticasone-Umeclidin-Vilant Inhale 1 puff into the lungs daily at 2 PM.   Zejula 200 MG Tabs Generic drug: Niraparib Tosylate Take 1 tablet by mouth daily.        Discharge Exam: Filed Weights   07/31/22 1420 07/31/22 2328 08/01/22 0500  Weight: 53.5 kg 53.2 kg 53.2 kg  Physical Exam:   General:  AAO x 3,  cooperative, no distress;   HEENT:  Normocephalic, PERRL, otherwise with in Normal limits   Neuro:  CNII-XII intact. , normal motor and sensation, reflexes intact   Lungs:   Improved diffuse wheezing and rhonchi ... Fairly clear to auscultation BL, Respirations unlabored,  No crackles  Cardio:    S1/S2, RRR, No murmure, No Rubs or Gallops   Abdomen:  Soft, non-tender, bowel sounds active all four quadrants, no guarding or peritoneal signs.  Muscular  skeletal:  Limited exam -global generalized weaknesses - in bed, able to move all 4 extremities,   2+ pulses,  symmetric, No pitting edema  Skin:  Dry, warm to touch, negative for any Rashes,  Wounds: Please see nursing documentation          Condition at discharge: good  The results of significant diagnostics from this hospitalization (including imaging, microbiology, ancillary and laboratory) are listed below for reference.   Imaging Studies: CT Angio Chest PE W and/or Wo Contrast  Result Date:  07/31/2022 CLINICAL DATA:  Chest tightness short of breath EXAM: CT ANGIOGRAPHY CHEST WITH CONTRAST TECHNIQUE: Multidetector CT imaging of the chest was performed using the standard protocol during bolus administration of intravenous contrast. Multiplanar CT image reconstructions and MIPs were obtained to evaluate the vascular anatomy. RADIATION DOSE REDUCTION: This exam was performed according to the departmental dose-optimization program which includes automated exposure control, adjustment of the mA and/or kV according to patient size and/or use of iterative reconstruction technique. CONTRAST:  48m OMNIPAQUE IOHEXOL 350 MG/ML SOLN COMPARISON:  Chest x-ray 07/31/2022, CT chest 06/28/2022, 05/03/2022, 10/30/2021 FINDINGS: Cardiovascular: Satisfactory opacification of the pulmonary arteries to the segmental level. No evidence of pulmonary embolism. Moderate aortic atherosclerosis. No aneurysm or dissection. Mild coronary vascular calcification. Left-sided central venous catheter with tip at the cavoatrial region. Mediastinum/Nodes: Midline trachea. No thyroid mass. No suspicious lymph nodes. Mild circumferential thickening of the distal esophagus with small hiatal hernia. Lungs/Pleura: Emphysema with probable scarring at the apices. Bronchiectasis within the lingula and left lower lobe. Bronchial wall thickening most evident at the left lower lobe. Patchy mucous plugging. Tree-in-bud density within the anterior left upper lobe, subpleural right lower lobe and left lower lobe. No interval consolidation, pleural effusion, or pneumothorax. Upper Abdomen: No acute abnormality. Musculoskeletal: No chest wall abnormality. No acute or significant osseous findings. Review of the MIP images confirms the above findings. IMPRESSION: 1. Negative for acute pulmonary embolus or aortic dissection 2. Emphysema. Bronchiectasis at the lingula and left lower lobe with bronchial wall thickening and tree-in-bud nodularity as seen on  multiple priors and likely due to chronic atypical infection. No interval consolidative pneumonia. 3. Circumferential thickening of the distal esophagus with small hiatal hernia, this may be correlated with endoscopy if not already performed. Aortic Atherosclerosis (ICD10-I70.0) and Emphysema (ICD10-J43.9). Electronically Signed   By: KDonavan FoilM.D.   On: 07/31/2022 21:18   DG Chest 2 View  Result Date: 07/31/2022 CLINICAL DATA:  Increasing shortness of breath over the last 2-3 months with a new nonproductive cough EXAM: CHEST - 2 VIEW COMPARISON:  Chest radiograph 07/17/2022 FINDINGS: No pleural effusion. No pneumothorax. Unchanged cardiac and mediastinal contours. No new focal airspace opacity. Chronic emphysematous change and bibasilar bronchiectasis again noted. Left chest wall port with the tip in the lower SVC. No acute osseous abnormality. IMPRESSION: No active cardiopulmonary disease. Electronically Signed   By: HMarin RobertsM.D.   On: 07/31/2022 15:10   DG  Chest 2 View  Result Date: 07/17/2022 CLINICAL DATA:  COPD, productive cough for 3 weeks EXAM: CHEST - 2 VIEW COMPARISON:  06/28/2022 FINDINGS: Frontal and lateral views of the chest demonstrate a stable left chest wall port. The cardiac silhouette is unremarkable. Chronic areas of background emphysema, scarring, basilar bronchiectasis again noted. No acute airspace disease, effusion, or pneumothorax. No acute bony abnormalities. IMPRESSION: 1. Stable chronic changes as above.  No acute intrathoracic process. Electronically Signed   By: Randa Ngo M.D.   On: 07/17/2022 16:23    Microbiology: Results for orders placed or performed during the hospital encounter of 07/31/22  Resp Panel by RT-PCR (Flu A&B, Covid) Anterior Nasal Swab     Status: None   Collection Time: 07/31/22  5:35 PM   Specimen: Anterior Nasal Swab  Result Value Ref Range Status   SARS Coronavirus 2 by RT PCR NEGATIVE NEGATIVE Final    Comment: (NOTE) SARS-CoV-2  target nucleic acids are NOT DETECTED.  The SARS-CoV-2 RNA is generally detectable in upper respiratory specimens during the acute phase of infection. The lowest concentration of SARS-CoV-2 viral copies this assay can detect is 138 copies/mL. A negative result does not preclude SARS-Cov-2 infection and should not be used as the sole basis for treatment or other patient management decisions. A negative result may occur with  improper specimen collection/handling, submission of specimen other than nasopharyngeal swab, presence of viral mutation(s) within the areas targeted by this assay, and inadequate number of viral copies(<138 copies/mL). A negative result must be combined with clinical observations, patient history, and epidemiological information. The expected result is Negative.  Fact Sheet for Patients:  EntrepreneurPulse.com.au  Fact Sheet for Healthcare Providers:  IncredibleEmployment.be  This test is no t yet approved or cleared by the [place] FDA and  has been authorized for detection and/or diagnosis of SARS-CoV-2 by FDA under an Emergency Use Authorization (EUA). This EUA will remain  in effect (meaning this test can be used) for the duration of the COVID-19 declaration under Section 564(b)(1) of the Act, 21 U.S.C.section 360bbb-3(b)(1), unless the authorization is terminated  or revoked sooner.       Influenza A by PCR NEGATIVE NEGATIVE Final   Influenza B by PCR NEGATIVE NEGATIVE Final    Comment: (NOTE) The Xpert Xpress SARS-CoV-2/FLU/RSV plus assay is intended as an aid in the diagnosis of influenza from Nasopharyngeal swab specimens and should not be used as a sole basis for treatment. Nasal washings and aspirates are unacceptable for Xpert Xpress SARS-CoV-2/FLU/RSV testing.  Fact Sheet for Patients: EntrepreneurPulse.com.au  Fact Sheet for Healthcare  Providers: IncredibleEmployment.be  This test is not yet approved or cleared by the [place] FDA and has been authorized for detection and/or diagnosis of SARS-CoV-2 by FDA under an Emergency Use Authorization (EUA). This EUA will remain in effect (meaning this test can be used) for the duration of the COVID-19 declaration under Section 564(b)(1) of the Act, 21 U.S.C. section 360bbb-3(b)(1), unless the authorization is terminated or revoked.  Performed at [facility], [address] [place], [place] [address]   MRSA Next Gen by PCR, Nasal     Status: None   Collection Time: 07/31/22 11:09 PM   Specimen: Nasal Mucosa; Nasal Swab  Result Value Ref Range Status   MRSA by PCR Next Gen NOT DETECTED NOT DETECTED Final    Comment: (NOTE) The GeneXpert MRSA Assay (FDA approved for NASAL specimens only), is one component of a comprehensive MRSA colonization surveillance program. It is  not intended to diagnose MRSA infection nor to guide or monitor treatment for MRSA infections. Test performance is not FDA approved in patients less than 16 years old. Performed at [facility], [address], [place], [place] [address]     Labs: CBC: Recent Labs  Lab 07/31/22 1622 08/01/22 0309 08/02/22 0519  WBC 4.2 3.3* 5.4  NEUTROABS  --  3.1  --   HGB 10.4* 9.4* 9.0*  HCT 31.7* 29.1* 27.4*  MCV 96.4 96.4 95.8  PLT 184 168 267   Basic Metabolic Panel: Recent Labs  Lab 07/31/22 1622 08/01/22 0309 08/02/22 0519  NA 137 135 137  K 3.5 3.7 3.4*  CL 93* 97* 96*  CO2 33* 30 32  GLUCOSE 115* 171* 124*  BUN '8 10 12  '$ CREATININE 0.74 0.64 0.62  CALCIUM 9.0 8.7* 8.7*  MG 1.8 2.1  --    Liver Function Tests: Recent Labs  Lab 08/01/22 0309  AST 24  ALT 10  ALKPHOS 88  BILITOT 0.5  PROT 6.6  ALBUMIN 3.4*   CBG: No results for input(s): "GLUCAP" in the last 168 hours.  Discharge time spent: greater than 30 minutes.  Signed: Deatra James, MD [facility] 08/02/2022

## 2022-08-02 NOTE — Care Management Important Message (Signed)
Important Message  Patient Details  Name: [name] MRN: [id number] Date of Birth: 1956/08/29   Medicare Important Message Given:  N/A - LOS <3 / Initial given by admissions     Tommy Medal 08/02/2022, 10:14 AM

## 2022-08-05 ENCOUNTER — Other Ambulatory Visit (HOSPITAL_COMMUNITY): Payer: Self-pay

## 2022-08-06 ENCOUNTER — Other Ambulatory Visit (HOSPITAL_COMMUNITY): Payer: Self-pay

## 2022-08-08 ENCOUNTER — Other Ambulatory Visit (HOSPITAL_COMMUNITY): Payer: Self-pay

## 2022-08-14 ENCOUNTER — Other Ambulatory Visit (HOSPITAL_COMMUNITY): Payer: Medicare HMO

## 2022-08-15 ENCOUNTER — Inpatient Hospital Stay: Payer: Medicare HMO | Attending: Hematology

## 2022-08-15 ENCOUNTER — Other Ambulatory Visit: Payer: Medicare HMO

## 2022-08-15 DIAGNOSIS — C569 Malignant neoplasm of unspecified ovary: Secondary | ICD-10-CM | POA: Diagnosis not present

## 2022-08-15 DIAGNOSIS — C786 Secondary malignant neoplasm of retroperitoneum and peritoneum: Secondary | ICD-10-CM | POA: Diagnosis present

## 2022-08-15 LAB — CBC WITH DIFFERENTIAL/PLATELET
Abs Immature Granulocytes: 0.01 10*3/uL (ref 0.00–0.07)
Basophils Absolute: 0.1 10*3/uL (ref 0.0–0.1)
Basophils Relative: 1 %
Eosinophils Absolute: 0.1 10*3/uL (ref 0.0–0.5)
Eosinophils Relative: 3 %
HCT: 26.1 % — ABNORMAL LOW (ref 36.0–46.0)
Hemoglobin: 8.6 g/dL — ABNORMAL LOW (ref 12.0–15.0)
Immature Granulocytes: 0 %
Lymphocytes Relative: 20 %
Lymphs Abs: 0.7 10*3/uL (ref 0.7–4.0)
MCH: 32 pg (ref 26.0–34.0)
MCHC: 33 g/dL (ref 30.0–36.0)
MCV: 97 fL (ref 80.0–100.0)
Monocytes Absolute: 0.3 10*3/uL (ref 0.1–1.0)
Monocytes Relative: 7 %
Neutro Abs: 2.4 10*3/uL (ref 1.7–7.7)
Neutrophils Relative %: 69 %
Platelets: 46 10*3/uL — ABNORMAL LOW (ref 150–400)
RBC: 2.69 MIL/uL — ABNORMAL LOW (ref 3.87–5.11)
RDW: 17.1 % — ABNORMAL HIGH (ref 11.5–15.5)
WBC: 3.6 10*3/uL — ABNORMAL LOW (ref 4.0–10.5)
nRBC: 0 % (ref 0.0–0.2)

## 2022-08-15 LAB — COMPREHENSIVE METABOLIC PANEL
ALT: 13 U/L (ref 0–44)
AST: 24 U/L (ref 15–41)
Albumin: 3.5 g/dL (ref 3.5–5.0)
Alkaline Phosphatase: 89 U/L (ref 38–126)
Anion gap: 8 (ref 5–15)
BUN: 7 mg/dL — ABNORMAL LOW (ref 8–23)
CO2: 30 mmol/L (ref 22–32)
Calcium: 8.3 mg/dL — ABNORMAL LOW (ref 8.9–10.3)
Chloride: 97 mmol/L — ABNORMAL LOW (ref 98–111)
Creatinine, Ser: 0.66 mg/dL (ref 0.44–1.00)
GFR, Estimated: >60 mL/min (ref >60)
Glucose, Bld: 110 mg/dL — ABNORMAL HIGH (ref 70–99)
Potassium: 3.8 mmol/L (ref 3.5–5.1)
Sodium: 135 mmol/L (ref 135–145)
Total Bilirubin: 0.9 mg/dL (ref 0.3–1.2)
Total Protein: 6.4 g/dL — ABNORMAL LOW (ref 6.5–8.1)

## 2022-08-15 LAB — IRON AND TIBC
Iron: 73 ug/dL (ref 28–170)
Saturation Ratios: 23 % (ref 10.4–31.8)
TIBC: 315 ug/dL (ref 250–450)
UIBC: 242 ug/dL

## 2022-08-15 LAB — FERRITIN: Ferritin: 761 ng/mL — ABNORMAL HIGH (ref 11–307)

## 2022-08-17 LAB — CA 125: Cancer Antigen (CA) 125: 36.9 U/mL (ref 0.0–38.1)

## 2022-08-21 ENCOUNTER — Ambulatory Visit (HOSPITAL_COMMUNITY): Payer: Medicare HMO | Admitting: Hematology

## 2022-08-22 ENCOUNTER — Telehealth (INDEPENDENT_AMBULATORY_CARE_PROVIDER_SITE_OTHER): Payer: Self-pay | Admitting: Gastroenterology

## 2022-08-22 ENCOUNTER — Other Ambulatory Visit (HOSPITAL_COMMUNITY): Payer: Self-pay

## 2022-08-22 NOTE — Telephone Encounter (Signed)
Patient called the office stated she needs to see Dr Carollee Sires has a procedure scheduled for 10/17 - we would have to put her on a cancellation list - ph# [phone] - please advise

## 2022-08-22 NOTE — Telephone Encounter (Signed)
Patient called today stating she was still having issues with her throat and with swallowing. She says her throat is sore. She is not having any issues with her Jerrye Bushy and is taking pantoprazole 40 mg bid. She is due for a TCS on 09/03/2022, and wanted another appointment here before then. She had a recent EGD on 05/04/2022 and was last seen her at the end of August. Please advise.

## 2022-08-22 NOTE — Telephone Encounter (Signed)
May see her earlier if there is any cancellation However, she is due for repeat EGD.  If she is agreeable, we will can proceed with a repeat EGD on the same day of her colonoscopy. Darius Bump, please reach her and discuss if she is interested.  If so schedule her for EGD.  Diagnosis: See esophagitis.

## 2022-08-23 NOTE — Telephone Encounter (Signed)
Thanks

## 2022-08-23 NOTE — Telephone Encounter (Addendum)
I spoke with the patient and she is aware to come in on 08/26/2022 at 2:45 p.  Mitzie, please add to the cancellation list. Thanks.  I spoke with the patient she is aware that she is scheduled for both EGD and TCS on 09/03/2022. She is aware we will place her on a cancellation list.

## 2022-08-26 ENCOUNTER — Encounter (INDEPENDENT_AMBULATORY_CARE_PROVIDER_SITE_OTHER): Payer: Self-pay | Admitting: Gastroenterology

## 2022-08-26 ENCOUNTER — Ambulatory Visit (INDEPENDENT_AMBULATORY_CARE_PROVIDER_SITE_OTHER): Payer: Medicare HMO | Admitting: Gastroenterology

## 2022-08-26 VITALS — BP 119/67 | HR 110 | Temp 97.6°F | Ht 60.0 in | Wt 116.3 lb

## 2022-08-26 DIAGNOSIS — R1319 Other dysphagia: Secondary | ICD-10-CM

## 2022-08-26 DIAGNOSIS — K209 Esophagitis, unspecified without bleeding: Secondary | ICD-10-CM | POA: Diagnosis not present

## 2022-08-26 DIAGNOSIS — K2101 Gastro-esophageal reflux disease with esophagitis, with bleeding: Secondary | ICD-10-CM | POA: Diagnosis not present

## 2022-08-26 DIAGNOSIS — D509 Iron deficiency anemia, unspecified: Secondary | ICD-10-CM

## 2022-08-26 DIAGNOSIS — D649 Anemia, unspecified: Secondary | ICD-10-CM | POA: Diagnosis not present

## 2022-08-26 DIAGNOSIS — R1310 Dysphagia, unspecified: Secondary | ICD-10-CM

## 2022-08-26 MED ORDER — MAGIC MOUTHWASH: 10.0000 mL | Freq: Three times a day (TID) | ORAL | 1 refills | Status: DC | PRN

## 2022-08-26 NOTE — Progress Notes (Unsigned)
[name], M.D. Gastroenterology & Hepatology [facility] [address] [place], [place] [address]  Primary Care Physician: [name], [place] [address]  I will communicate my assessment and recommendations to the referring MD via EMR.  Problems: Grade D esophagitis Upper less intestinal bleeding secondary to esophagitis  History of Present Illness: [name] is a  66 y.o. 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  The patient was last seen on 07/15/2022. At that time, the patient was advised to continue pantoprazole 40 mg twice a day and she was scheduled for EGD/colonoscopy in mid October (already has an appointment on 09/03/2022).  Her hemoglobin was rechecked at that time which showed a value of 10.8, however most recent hemoglobin was 8.6 with a BUN of 7.  Patient reports that she has presented new onset of odynophagia for the last week. Feels this is present all the time. She denies any dysphagia. Has been taking pantoprazole 40 mg twice a day compliantly, has been waiting at least 30 minutes to have a meal, as instructed in the past.  The patient denies having any nausea, vomiting, fever, chills, hematochezia, melena, hematemesis, abdominal distention, abdominal pain, diarrhea, jaundice, pruritus or weight loss.  Last EGD: 06/30/2022, grade B esophagitis between 25 to 31 cm.  Cytology brushings were neg for Candida.  There was a small hiatal hernia and a moderate Schatzki's ring.  There was also presence of moderate inflammation with shallow ulcerations in the gastric body - mild reactive changes, neg for H. Pylori.  Duodenum was normal. Last Colonoscopy: Never  Past Medical History: Past Medical History:  Diagnosis Date   Anemia     Aortic atherosclerosis (HCC)    Arthritis    Asthma    Bilateral carotid artery stenosis    Cancer ([place])    Gastric Cancer   COPD (chronic obstructive pulmonary disease) (HCC)    High cholesterol    Hyperlipidemia    Hypertension    Neuropathy    Osteoarthritis    Ovarian cancer ([place])    Oxygen dependent    Peripheral vascular disease ([place])    Peritoneal carcinoma ([place])    Pneumonia 2019   Port-A-Cath in place 07/03/2020   Pulmonary emphysema [facility])     Past Surgical History: Past Surgical History:  Procedure Laterality Date   BIOPSY  06/30/2022   Procedure: BIOPSY;  Surgeon: Eloise Harman, DO;  [facility];  Service: Endoscopy;;  gastric   ESOPHAGOGASTRODUODENOSCOPY (EGD) WITH PROPOFOL N/A 02/17/2021   Procedure: ESOPHAGOGASTRODUODENOSCOPY (EGD) WITH PROPOFOL;  Surgeon: Harvel Quale, MD;  [facility];  Service: Gastroenterology;  Laterality: N/A;   ESOPHAGOGASTRODUODENOSCOPY (EGD) WITH PROPOFOL N/A 05/04/2022   Procedure: ESOPHAGOGASTRODUODENOSCOPY (EGD) WITH PROPOFOL;  Surgeon: Daneil Dolin, MD;  [facility];  Service: Endoscopy;  Laterality: N/A;   ESOPHAGOGASTRODUODENOSCOPY (EGD) WITH PROPOFOL N/A 06/30/2022   Procedure: ESOPHAGOGASTRODUODENOSCOPY (EGD) WITH PROPOFOL;  Surgeon: Eloise Harman, DO;  [facility];  Service: Endoscopy;  Laterality: N/A;   HALLUX VALGUS BASE WEDGE Right 06/09/2015   Procedure: Base wedge osteotomy with modified McBride right foot ;  Surgeon: Sharlotte Alamo, MD;  Location: [facility] ORS;  Service: Podiatry;  Laterality: Right;   PORTACATH PLACEMENT Left 06/28/2020   Procedure: PORT-A-CATHETER  PLACEMENT LEFT CHEST (attached catheter in left subclavian);  Surgeon: Virl Cagey, MD;  [facility];  Service: General;  Laterality: Left;   TUBAL LIGATION     VIDEO BRONCHOSCOPY WITH ENDOBRONCHIAL NAVIGATION N/A 03/09/2021   Procedure: VIDEO BRONCHOSCOPY WITH ENDOBRONCHIAL NAVIGATION;   Surgeon: Ottie Glazier, MD;  Location: [facility] OR[facility];  Service: Thoracic;  Laterality: N/A;   VIDEO BRONCHOSCOPY WITH ENDOBRONCHIAL ULTRASOUND N/A 03/09/2021   Procedure: VIDEO BRONCHOSCOPY WITH ENDOBRONCHIAL ULTRASOUND;  Surgeon: Ottie Glazier, MD;  Location: [facility];  Service: Thoracic;  Laterality: N/A;    Family History: Family History  Problem Relation Age of Onset   Alzheimer's disease Mother    COPD Father    Emphysema Father    Hypertension Father    Healthy Sister    Healthy Brother    Alzheimer's disease Maternal Grandmother    Healthy Sister    Healthy Sister    Healthy Sister    Prostate cancer Other        paternal grandmother's brother; dx in early 28s   Breast cancer Neg Hx     Social History: Social History   Tobacco Use  Smoking Status Former   Packs/day: 2.00   Years: 30.00   Total pack years: 60.00   Types: Cigarettes   Quit date: 11/04/2013   Years since quitting: 8.8  Smokeless Tobacco Never   Social History   Substance and Sexual Activity  Alcohol Use No   Social History   Substance and Sexual Activity  Drug Use No    Allergies: No Known Allergies  Medications: Current Outpatient Medications  Medication Sig Dispense Refill   Acetylcysteine (MUCOMYST-10 IN) Inhale 1 vial into the lungs every three (3) days as needed (copd).     albuterol (VENTOLIN HFA) 108 (90 Base) MCG/ACT inhaler Inhale 2 puffs into the lungs every 6 (six) hours as needed for wheezing or shortness of breath. 18 g 3   amLODipine (NORVASC) 5 MG tablet Take 5 mg by mouth at bedtime.      aspirin EC 81 MG tablet Take 1 tablet (81 mg total) by mouth daily with breakfast. 30 tablet 11   atorvastatin (LIPITOR) 40 MG tablet Take 1 tablet (40 mg total) by mouth daily. 30 tablet 1   ferrous sulfate 325 (65 FE) MG tablet Take 325 mg by mouth every other day.     Fluticasone-Umeclidin-Vilant (TRELEGY ELLIPTA) 100-62.5-25 MCG/ACT AEPB Inhale 1 puff into the lungs daily at 2 PM. 28  each 3   ipratropium (ATROVENT HFA) 17 MCG/ACT inhaler Inhale 2 puffs into the lungs every 6 (six) hours.     levalbuterol (XOPENEX) 1.25 MG/3ML nebulizer solution Inhale into the lungs.     magnesium oxide (MAG-OX) 400 (240 Mg) MG tablet Take 1 tablet by mouth twice daily (Patient taking differently: Take 400 mg by mouth 2 (two) times daily.) 120 tablet 3   niraparib tosylate (ZEJULA) 200 MG TABS Take 1 tablet by mouth daily. 30 tablet 9   nortriptyline (PAMELOR) 25 MG capsule Take 25 mg by mouth at bedtime.     OXYGEN Inhale 3-4 L into the lungs continuous.     pantoprazole (PROTONIX) 40 MG tablet Take 1 tablet (40 mg total) by mouth 2 (two) times daily. (Patient taking differently: Take 40 mg by mouth daily.) 180 tablet 3   primidone (MYSOLINE) 50 MG tablet Take 50 mg by mouth at bedtime.     polyethylene glycol-electrolytes (TRILYTE) 420 g solution Take 4,000 mLs by mouth  as directed. (Patient not taking: Reported on 07/31/2022) 4000 mL 0   No current facility-administered medications for this visit.    Review of Systems: GENERAL: negative for malaise, night sweats HEENT: No changes in hearing or vision, no nose bleeds or other nasal problems. NECK: Negative for lumps, goiter, pain and significant neck swelling RESPIRATORY: Negative for cough, wheezing CARDIOVASCULAR: Negative for chest pain, leg swelling, palpitations, orthopnea GI: SEE HPI MUSCULOSKELETAL: Negative for joint pain or swelling, back pain, and muscle pain. SKIN: Negative for lesions, rash PSYCH: Negative for sleep disturbance, mood disorder and recent psychosocial stressors. HEMATOLOGY Negative for prolonged bleeding, bruising easily, and swollen nodes. ENDOCRINE: Negative for cold or heat intolerance, polyuria, polydipsia and goiter. NEURO: negative for tremor, gait imbalance, syncope and seizures. The remainder of the review of systems is noncontributory.   Physical Exam: BP 119/67 (BP Location: Left Arm, Patient  Position: Sitting, Cuff Size: Small)   Pulse (!) 110   Temp 97.6 F (36.4 C) (Oral)   Ht 5' (1.524 m)   Wt 116 lb 4.8 oz (52.8 kg)   BMI 22.71 kg/m  GENERAL: The patient is AO x3, in no acute distress.  Uses portable oxygen HEENT: Head is normocephalic and atraumatic. EOMI are intact. Mouth is well hydrated and without lesions. NECK: Supple. No masses LUNGS: Clear to auscultation. No presence of rhonchi/wheezing/rales. Adequate chest expansion HEART: RRR, normal s1 and s2. ABDOMEN: Soft, nontender, no guarding, no peritoneal signs, and nondistended. BS +. No masses. EXTREMITIES: Without any cyanosis, clubbing, rash, lesions or edema. NEUROLOGIC: AOx3, no focal motor deficit. SKIN: no jaundice, no rashes  Imaging/Labs: as above  I personally reviewed and interpreted the available labs, imaging and endoscopic files.  Impression and Plan: PORTER NAKAMA is a  66 y.o. 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  The patient has presented recurrent improvement of her dysphagia and heartburn after starting her PPI twice a day but has presented new onset of odynophagia despite taking the medication compliantly and on appropriate fashion.  I explained to the patient that we will need to explore this further with this nodule esophagogastroduodenospy to look for other etiologies such as persistent esophagitis versus candidiasis, but for now she will need to continue the PPI twice daily and she will try Magic mouthwash.  She will also proceed with colonoscopy to evaluate further her persistent anemia as she has never had a colonoscopy in the past.  - Proceed with scheduled EGD and colonoscopy - Continue pantoprazole 40 mg twice a day - Can take magic mouthwash as needed for throat pain for now  All questions were answered.       Vicki Peppers, MD Gastroenterology and Hepatology [facility]

## 2022-08-26 NOTE — Patient Instructions (Signed)
Proceed with scheduled EGD and colonoscopy Continue pantoprazole 40 mg twice a day Can take magic mouthwash as needed for throat pain for now

## 2022-08-26 NOTE — H&P (View-Only) (Signed)
[name], M.D. Gastroenterology & Hepatology [facility] [address] [place], [place] [address]  Primary Care Physician: [name], [place] [address]  I will communicate my assessment and recommendations to the referring MD via EMR.  Problems: Grade D esophagitis Upper less intestinal bleeding secondary to esophagitis  History of Present Illness: [name] is a  66 y.o. 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  The patient was last seen on 07/15/2022. At that time, the patient was advised to continue pantoprazole 40 mg twice a day and she was scheduled for EGD/colonoscopy in mid October (already has an appointment on 09/03/2022).  Her hemoglobin was rechecked at that time which showed a value of 10.8, however most recent hemoglobin was 8.6 with a BUN of 7.  Patient reports that she has presented new onset of odynophagia for the last week. Feels this is present all the time. She denies any dysphagia. Has been taking pantoprazole 40 mg twice a day compliantly, has been waiting at least 30 minutes to have a meal, as instructed in the past.  The patient denies having any nausea, vomiting, fever, chills, hematochezia, melena, hematemesis, abdominal distention, abdominal pain, diarrhea, jaundice, pruritus or weight loss.  Last EGD: 06/30/2022, grade B esophagitis between 25 to 31 cm.  Cytology brushings were neg for Candida.  There was a small hiatal hernia and a moderate Schatzki's ring.  There was also presence of moderate inflammation with shallow ulcerations in the gastric body - mild reactive changes, neg for H. Pylori.  Duodenum was normal. Last Colonoscopy: Never  Past Medical History: Past Medical History:  Diagnosis Date   Anemia     Aortic atherosclerosis (HCC)    Arthritis    Asthma    Bilateral carotid artery stenosis    Cancer ([place])    Gastric Cancer   COPD (chronic obstructive pulmonary disease) (HCC)    High cholesterol    Hyperlipidemia    Hypertension    Neuropathy    Osteoarthritis    Ovarian cancer ([place])    Oxygen dependent    Peripheral vascular disease ([place])    Peritoneal carcinoma ([place])    Pneumonia 2019   Port-A-Cath in place 07/03/2020   Pulmonary emphysema [facility])     Past Surgical History: Past Surgical History:  Procedure Laterality Date   BIOPSY  06/30/2022   Procedure: BIOPSY;  Surgeon: Eloise Harman, DO;  [facility];  Service: Endoscopy;;  gastric   ESOPHAGOGASTRODUODENOSCOPY (EGD) WITH PROPOFOL N/A 02/17/2021   Procedure: ESOPHAGOGASTRODUODENOSCOPY (EGD) WITH PROPOFOL;  Surgeon: Harvel Quale, MD;  [facility];  Service: Gastroenterology;  Laterality: N/A;   ESOPHAGOGASTRODUODENOSCOPY (EGD) WITH PROPOFOL N/A 05/04/2022   Procedure: ESOPHAGOGASTRODUODENOSCOPY (EGD) WITH PROPOFOL;  Surgeon: Daneil Dolin, MD;  [facility];  Service: Endoscopy;  Laterality: N/A;   ESOPHAGOGASTRODUODENOSCOPY (EGD) WITH PROPOFOL N/A 06/30/2022   Procedure: ESOPHAGOGASTRODUODENOSCOPY (EGD) WITH PROPOFOL;  Surgeon: Eloise Harman, DO;  [facility];  Service: Endoscopy;  Laterality: N/A;   HALLUX VALGUS BASE WEDGE Right 06/09/2015   Procedure: Base wedge osteotomy with modified McBride right foot ;  Surgeon: Sharlotte Alamo, MD;  Location: [facility];  Service: Podiatry;  Laterality: Right;   PORTACATH PLACEMENT Left 06/28/2020   Procedure: PORT-A-CATHETER  PLACEMENT LEFT CHEST (attached catheter in left subclavian);  Surgeon: Virl Cagey, MD;  [facility];  Service: General;  Laterality: Left;   TUBAL LIGATION     VIDEO BRONCHOSCOPY WITH ENDOBRONCHIAL NAVIGATION N/A 03/09/2021   Procedure: VIDEO BRONCHOSCOPY WITH ENDOBRONCHIAL NAVIGATION;   Surgeon: Ottie Glazier, MD;  Location: [facility];  Service: Thoracic;  Laterality: N/A;   VIDEO BRONCHOSCOPY WITH ENDOBRONCHIAL ULTRASOUND N/A 03/09/2021   Procedure: VIDEO BRONCHOSCOPY WITH ENDOBRONCHIAL ULTRASOUND;  Surgeon: Ottie Glazier, MD;  Location: [facility];  Service: Thoracic;  Laterality: N/A;    Family History: Family History  Problem Relation Age of Onset   Alzheimer's disease Mother    COPD Father    Emphysema Father    Hypertension Father    Healthy Sister    Healthy Brother    Alzheimer's disease Maternal Grandmother    Healthy Sister    Healthy Sister    Healthy Sister    Prostate cancer Other        paternal grandmother's brother; dx in early 72s   Breast cancer Neg Hx     Social History: Social History   Tobacco Use  Smoking Status Former   Packs/day: 2.00   Years: 30.00   Total pack years: 60.00   Types: Cigarettes   Quit date: 11/04/2013   Years since quitting: 8.8  Smokeless Tobacco Never   Social History   Substance and Sexual Activity  Alcohol Use No   Social History   Substance and Sexual Activity  Drug Use No    Allergies: No Known Allergies  Medications: Current Outpatient Medications  Medication Sig Dispense Refill   Acetylcysteine (MUCOMYST-10 IN) Inhale 1 vial into the lungs every three (3) days as needed (copd).     albuterol (VENTOLIN HFA) 108 (90 Base) MCG/ACT inhaler Inhale 2 puffs into the lungs every 6 (six) hours as needed for wheezing or shortness of breath. 18 g 3   amLODipine (NORVASC) 5 MG tablet Take 5 mg by mouth at bedtime.      aspirin EC 81 MG tablet Take 1 tablet (81 mg total) by mouth daily with breakfast. 30 tablet 11   atorvastatin (LIPITOR) 40 MG tablet Take 1 tablet (40 mg total) by mouth daily. 30 tablet 1   ferrous sulfate 325 (65 FE) MG tablet Take 325 mg by mouth every other day.     Fluticasone-Umeclidin-Vilant (TRELEGY ELLIPTA) 100-62.5-25 MCG/ACT AEPB Inhale 1 puff into the lungs daily at 2 PM. 28  each 3   ipratropium (ATROVENT HFA) 17 MCG/ACT inhaler Inhale 2 puffs into the lungs every 6 (six) hours.     levalbuterol (XOPENEX) 1.25 MG/3ML nebulizer solution Inhale into the lungs.     magnesium oxide (MAG-OX) 400 (240 Mg) MG tablet Take 1 tablet by mouth twice daily (Patient taking differently: Take 400 mg by mouth 2 (two) times daily.) 120 tablet 3   niraparib tosylate (ZEJULA) 200 MG TABS Take 1 tablet by mouth daily. 30 tablet 9   nortriptyline (PAMELOR) 25 MG capsule Take 25 mg by mouth at bedtime.     OXYGEN Inhale 3-4 L into the lungs continuous.     pantoprazole (PROTONIX) 40 MG tablet Take 1 tablet (40 mg total) by mouth 2 (two) times daily. (Patient taking differently: Take 40 mg by mouth daily.) 180 tablet 3   primidone (MYSOLINE) 50 MG tablet Take 50 mg by mouth at bedtime.     polyethylene glycol-electrolytes (TRILYTE) 420 g solution Take 4,000 mLs by mouth  as directed. (Patient not taking: Reported on 07/31/2022) 4000 mL 0   No current facility-administered medications for this visit.    Review of Systems: GENERAL: negative for malaise, night sweats HEENT: No changes in hearing or vision, no nose bleeds or other nasal problems. NECK: Negative for lumps, goiter, pain and significant neck swelling RESPIRATORY: Negative for cough, wheezing CARDIOVASCULAR: Negative for chest pain, leg swelling, palpitations, orthopnea GI: SEE HPI MUSCULOSKELETAL: Negative for joint pain or swelling, back pain, and muscle pain. SKIN: Negative for lesions, rash PSYCH: Negative for sleep disturbance, mood disorder and recent psychosocial stressors. HEMATOLOGY Negative for prolonged bleeding, bruising easily, and swollen nodes. ENDOCRINE: Negative for cold or heat intolerance, polyuria, polydipsia and goiter. NEURO: negative for tremor, gait imbalance, syncope and seizures. The remainder of the review of systems is noncontributory.   Physical Exam: BP 119/67 (BP Location: Left Arm, Patient  Position: Sitting, Cuff Size: Small)   Pulse (!) 110   Temp 97.6 F (36.4 C) (Oral)   Ht 5' (1.524 m)   Wt 116 lb 4.8 oz (52.8 kg)   BMI 22.71 kg/m  GENERAL: The patient is AO x3, in no acute distress.  Uses portable oxygen HEENT: Head is normocephalic and atraumatic. EOMI are intact. Mouth is well hydrated and without lesions. NECK: Supple. No masses LUNGS: Clear to auscultation. No presence of rhonchi/wheezing/rales. Adequate chest expansion HEART: RRR, normal s1 and s2. ABDOMEN: Soft, nontender, no guarding, no peritoneal signs, and nondistended. BS +. No masses. EXTREMITIES: Without any cyanosis, clubbing, rash, lesions or edema. NEUROLOGIC: AOx3, no focal motor deficit. SKIN: no jaundice, no rashes  Imaging/Labs: as above  I personally reviewed and interpreted the available labs, imaging and endoscopic files.  Impression and Plan: [name] is a  66 y.o. 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  The patient has presented recurrent improvement of her dysphagia and heartburn after starting her PPI twice a day but has presented new onset of odynophagia despite taking the medication compliantly and on appropriate fashion.  I explained to the patient that we will need to explore this further with this nodule esophagogastroduodenospy to look for other etiologies such as persistent esophagitis versus candidiasis, but for now she will need to continue the PPI twice daily and she will try Magic mouthwash.  She will also proceed with colonoscopy to evaluate further her persistent anemia as she has never had a colonoscopy in the past.  - Proceed with scheduled EGD and colonoscopy - Continue pantoprazole 40 mg twice a day - Can take magic mouthwash as needed for throat pain for now  All questions were answered.       Vicki Peppers, MD Gastroenterology and Hepatology [facility]

## 2022-08-27 ENCOUNTER — Other Ambulatory Visit (HOSPITAL_COMMUNITY): Payer: Self-pay

## 2022-08-27 ENCOUNTER — Other Ambulatory Visit: Payer: Self-pay

## 2022-08-27 ENCOUNTER — Inpatient Hospital Stay: Payer: Medicare HMO | Attending: Hematology | Admitting: Hematology

## 2022-08-27 ENCOUNTER — Inpatient Hospital Stay: Payer: Medicare HMO

## 2022-08-27 VITALS — BP 126/82 | HR 97 | Temp 97.8°F | Resp 18 | Wt 116.3 lb

## 2022-08-27 DIAGNOSIS — D539 Nutritional anemia, unspecified: Secondary | ICD-10-CM

## 2022-08-27 DIAGNOSIS — Z800 Family history of malignant neoplasm of digestive organs: Secondary | ICD-10-CM | POA: Insufficient documentation

## 2022-08-27 DIAGNOSIS — Z8042 Family history of malignant neoplasm of prostate: Secondary | ICD-10-CM | POA: Diagnosis not present

## 2022-08-27 DIAGNOSIS — I10 Essential (primary) hypertension: Secondary | ICD-10-CM | POA: Diagnosis not present

## 2022-08-27 DIAGNOSIS — D649 Anemia, unspecified: Secondary | ICD-10-CM | POA: Insufficient documentation

## 2022-08-27 DIAGNOSIS — C786 Secondary malignant neoplasm of retroperitoneum and peritoneum: Secondary | ICD-10-CM | POA: Insufficient documentation

## 2022-08-27 DIAGNOSIS — G629 Polyneuropathy, unspecified: Secondary | ICD-10-CM | POA: Diagnosis not present

## 2022-08-27 DIAGNOSIS — C569 Malignant neoplasm of unspecified ovary: Secondary | ICD-10-CM | POA: Diagnosis present

## 2022-08-27 DIAGNOSIS — Z87891 Personal history of nicotine dependence: Secondary | ICD-10-CM | POA: Insufficient documentation

## 2022-08-27 DIAGNOSIS — J449 Chronic obstructive pulmonary disease, unspecified: Secondary | ICD-10-CM | POA: Insufficient documentation

## 2022-08-27 LAB — CBC
HCT: 25.2 % — ABNORMAL LOW (ref 36.0–46.0)
Hemoglobin: 8.3 g/dL — ABNORMAL LOW (ref 12.0–15.0)
MCH: 32.5 pg (ref 26.0–34.0)
MCHC: 32.9 g/dL (ref 30.0–36.0)
MCV: 98.8 fL (ref 80.0–100.0)
Platelets: 133 10*3/uL — ABNORMAL LOW (ref 150–400)
RBC: 2.55 MIL/uL — ABNORMAL LOW (ref 3.87–5.11)
RDW: 17.7 % — ABNORMAL HIGH (ref 11.5–15.5)
WBC: 5.9 10*3/uL (ref 4.0–10.5)
nRBC: 0 % (ref 0.0–0.2)

## 2022-08-27 LAB — VITAMIN B12: Vitamin B-12: 183 pg/mL (ref 180–914)

## 2022-08-27 LAB — LACTATE DEHYDROGENASE: LDH: 193 U/L — ABNORMAL HIGH (ref 98–192)

## 2022-08-27 LAB — FOLATE: Folate: 8.4 ng/mL (ref >5.9)

## 2022-08-27 NOTE — Patient Instructions (Signed)
[facility]  Discharge Instructions  You were seen and examined today by [name].  [name] discussed your most recent lab work which revealed that your platelets are low and you are slightly anemic.   Please stop by the lab on your way out.  Follow-up as scheduled in 2 months with labs and scan.    Thank you for choosing [facility] to provide your oncology and hematology care.   To afford each patient quality time with our provider, please arrive at least 15 minutes before your scheduled appointment time. You may need to reschedule your appointment if you arrive late (10 or more minutes). Arriving late affects you and other patients whose appointments are after yours.  Also, if you miss three or more appointments without notifying the office, you may be dismissed from the clinic at the provider's discretion.    Again, thank you for choosing [facility].  Our hope is that these requests will decrease the amount of time that you wait before being seen by our physicians.   If you have a lab appointment with the [facility] please come in thru the Main Entrance and check in at the main information desk.           _____________________________________________________________  Should you have questions after your visit to [facility], please contact our office at [phone] and follow the prompts.  Our office hours are 8:00 a.m. to 4:30 p.m. Monday - Thursday and 8:00 a.m. to 2:30 p.m. Friday.  Please note that voicemails left after 4:00 p.m. may not be returned until the following business day.  We are closed weekends and all major holidays.  You do have access to a nurse 24-7, just call the main number to the clinic [phone] and do not press any options, hold on the line and a nurse will answer the phone.    For prescription refill requests, have your pharmacy contact our office and allow  72 hours.    Masks are optional in the cancer centers. If you would like for your care team to wear a mask while they are taking care of you, please let them know. You may have one support person who is at least 64 years old accompany you for your appointments.

## 2022-08-27 NOTE — Progress Notes (Signed)
 [name] [address] [place], [place] [address]   CLINIC:  Medical Oncology/Hematology  PCP:  Adamo, Elena M, MD [address] / [place] [address] [phone]   REASON FOR VISIT:  Follow-up for high-grade serous ovarian carcinoma  PRIOR THERAPY: none  NGS Results: BRCA 1/2 negative  CURRENT THERAPY: Carboplatin, paclitaxel & Aloxi every 3 weeks  BRIEF ONCOLOGIC HISTORY:  Oncology History  Peritoneal carcinomatosis (HCC)  06/06/2020 Initial Diagnosis   Peritoneal carcinomatosis (HCC)   07/05/2020 -  Chemotherapy    Patient is on Treatment Plan: OVARIAN CARBOPLATIN (AUC 6) / PACLITAXEL (175) Q21D X 6 CYCLES      07/22/2020 Genetic Testing   Negative genetic testing: no pathogenic variants detected in [employer] Expanded CancerNext Panel + RNAInsight. The CancerNext-Expanded gene panel offered by [employer] and includes sequencing and rearrangement analysis for the following 77 genes: AIP, ALK, APC*, ATM*, AXIN2, BAP1, BARD1, BLM, BMPR1A, BRCA1*, BRCA2*, BRIP1*, CDC73, CDH1*, CDK4, CDKN1B, CDKN2A, CHEK2*, CTNNA1, DICER1, FANCC, FH, FLCN, GALNT12, KIF1B, LZTR1, MAX, MEN1, MET, MLH1*, MSH2*, MSH3, MSH6*, MUTYH*, NBN, NF1*, NF2, NTHL1, PALB2*, PHOX2B, PMS2*, POT1, PRKAR1A, PTCH1, PTEN*, RAD51C*, RAD51D*, RB1, RECQL, RET, SDHA, SDHAF2, SDHB, SDHC, SDHD, SMAD4, SMARCA4, SMARCB1, SMARCE1, STK11, SUFU, TMEM127, TP53*, TSC1, TSC2, VHL and XRCC2 (sequencing and deletion/duplication); EGFR, EGLN1, HOXB13, KIT, MITF, PDGFRA, POLD1, and POLE (sequencing only); EPCAM and GREM1 (deletion/duplication only). DNA and RNA analyses performed for * genes. The report date is July 22, 2020.    Ovarian cancer (HCC)  07/07/2020 Initial Diagnosis   Ovarian cancer (HCC)   05/04/2021 Imaging   1. No findings today to suggest new or progressive disease related to ovarian cancer. 2. Interval near complete resolution of the irregular and nodular areas of consolidative left upper  lobe opacity seen on the previous study. Residual architectural distortion in this region is compatible with scarring. Tiny nodules persist in the left upper lobe and attention on follow-up recommended. 3. Interval improvement in tree-in-bud nodularity anteromedial left upper lobe. Changes of bronchial wall thickening and mild cylindrical bronchiectasis in the lower lungs are similar to prior with persistent tree-in-bud nodularity posterior left lower lobe. Imaging features suggest sequelae of atypical infection. 4. Similar appearance of the omental soft tissue stranding with less prominent nodularity on today's study. 5. No ascites. 6. Mild circumferential wall thickening distal esophagus. Esophagitis could have this appearance. 7. Left colonic diverticulosis without diverticulitis. 8. Aortic Atherosclerosis (ICD10-I70.0) and Emphysema (ICD10-J43.9).   05/30/2021 -  Chemotherapy   She takes niraparib         CANCER STAGING:  Cancer Staging  No matching staging information was found for the patient.  INTERVAL HISTORY:  [name], a 66 y.o. female, returns to clinic for follow-up of high-grade serous ovarian carcinoma.  She is taking Zejula at 200 mg daily.  She was admitted to the hospital from 07/31/2022 through 08/02/2022 with COPD exacerbation.  Her cough and shortness of breath on exertion have been stable.  She is on 3 L nasal cannula.  Reports easy bruising but denies any nosebleeds, bleeding per rectum or melena.  REVIEW OF SYSTEMS:  Review of Systems  Respiratory:  Positive for cough and shortness of breath (3L).   Gastrointestinal:  Negative for diarrhea.  Skin:  Negative for rash.  Hematological:  Bruises/bleeds easily.  All other systems reviewed and are negative.   PAST MEDICAL/SURGICAL HISTORY:  Past Medical History:  Diagnosis Date   Anemia    Aortic atherosclerosis (  HCC)    Arthritis    Asthma    Bilateral carotid artery stenosis    Cancer (HCC)     Gastric Cancer   COPD (chronic obstructive pulmonary disease) (HCC)    High cholesterol    Hyperlipidemia    Hypertension    Neuropathy    Osteoarthritis    Ovarian cancer (HCC)    Oxygen dependent    Peripheral vascular disease (HCC)    Peritoneal carcinoma (HCC)    Pneumonia 2019   Port-A-Cath in place 07/03/2020   Pulmonary emphysema (HCC)    Past Surgical History:  Procedure Laterality Date   BIOPSY  06/30/2022   Procedure: BIOPSY;  Surgeon: Carver, Charles K, DO;  Location: [facility];  Service: Endoscopy;;  gastric   ESOPHAGOGASTRODUODENOSCOPY (EGD) WITH PROPOFOL N/A 02/17/2021   Procedure: ESOPHAGOGASTRODUODENOSCOPY (EGD) WITH PROPOFOL;  Surgeon: Castaneda Mayorga, Daniel, MD;  Location: [facility];  Service: Gastroenterology;  Laterality: N/A;   ESOPHAGOGASTRODUODENOSCOPY (EGD) WITH PROPOFOL N/A 05/04/2022   Procedure: ESOPHAGOGASTRODUODENOSCOPY (EGD) WITH PROPOFOL;  Surgeon: Rourk, Robert M, MD;  Location: [facility];  Service: Endoscopy;  Laterality: N/A;   ESOPHAGOGASTRODUODENOSCOPY (EGD) WITH PROPOFOL N/A 06/30/2022   Procedure: ESOPHAGOGASTRODUODENOSCOPY (EGD) WITH PROPOFOL;  Surgeon: Carver, Charles K, DO;  Location: [facility];  Service: Endoscopy;  Laterality: N/A;   HALLUX VALGUS BASE WEDGE Right 06/09/2015   Procedure: Base wedge osteotomy with modified McBride right foot ;  Surgeon: Todd Cline, MD;  Location: [facility];  Service: Podiatry;  Laterality: Right;   PORTACATH PLACEMENT Left 06/28/2020   Procedure: PORT-A-CATHETER PLACEMENT LEFT CHEST (attached catheter in left subclavian);  Surgeon: Bridges, Lindsay C, MD;  Location: [facility];  Service: General;  Laterality: Left;   TUBAL LIGATION     VIDEO BRONCHOSCOPY WITH ENDOBRONCHIAL NAVIGATION N/A 03/09/2021   Procedure: VIDEO BRONCHOSCOPY WITH ENDOBRONCHIAL NAVIGATION;  Surgeon: Aleskerov, Fuad, MD;  Location: [facility];  Service: Thoracic;  Laterality: N/A;   VIDEO BRONCHOSCOPY WITH ENDOBRONCHIAL ULTRASOUND N/A  03/09/2021   Procedure: VIDEO BRONCHOSCOPY WITH ENDOBRONCHIAL ULTRASOUND;  Surgeon: Aleskerov, Fuad, MD;  Location: [facility];  Service: Thoracic;  Laterality: N/A;    SOCIAL HISTORY:  Social History   Socioeconomic History   Marital status: Divorced    Spouse name: Not on file   Number of children: 3   Years of education: Not on file   Highest education level: Not on file  Occupational History   Occupation: DISABLED  Tobacco Use   Smoking status: Former    Packs/day: 2.00    Years: 30.00    Total pack years: 60.00    Types: Cigarettes    Quit date: 11/04/2013    Years since quitting: 8.8   Smokeless tobacco: Never  Vaping Use   Vaping Use: Never used  Substance and Sexual Activity   Alcohol use: No   Drug use: No   Sexual activity: Not Currently  Other Topics Concern   Not on file  Social History Narrative   Not on file   Social Determinants of Health   Financial Resource Strain: Low Risk  (09/27/2020)   Overall Financial Resource Strain (CARDIA)    Difficulty of Paying Living Expenses: Not hard at all  Food Insecurity: No Food Insecurity (08/01/2022)   Hunger Vital Sign    Worried About Running Out of Food in the Last Year: Never true    Ran Out of Food in the Last Year: Never true  Transportation Needs: No Transportation Needs (08/01/2022)     PRAPARE - Transportation    Lack of Transportation (Medical): No    Lack of Transportation (Non-Medical): No  Physical Activity: Inactive (09/27/2020)   Exercise Vital Sign    Days of Exercise per Week: 0 days    Minutes of Exercise per Session: 0 min  Stress: No Stress Concern Present (09/27/2020)   [employer] - Occupational Stress Questionnaire    Feeling of Stress : Not at all  Social Connections: Socially Isolated (09/27/2020)   Social Connection and Isolation Panel [NHANES]    Frequency of Communication with Friends and Family: More than three times a week    Frequency of Social  Gatherings with Friends and Family: Never    Attends Religious Services: Never    Active Member of Clubs or Organizations: No    Attends Club or Organization Meetings: Never    Marital Status: Divorced  Intimate Partner Violence: Not At Risk (08/01/2022)   Humiliation, Afraid, Rape, and Kick questionnaire    Fear of Current or Ex-Partner: No    Emotionally Abused: No    Physically Abused: No    Sexually Abused: No    FAMILY HISTORY:  Family History  Problem Relation Age of Onset   Alzheimer's disease Mother    COPD Father    Emphysema Father    Hypertension Father    Healthy Sister    Healthy Brother    Alzheimer's disease Maternal Grandmother    Healthy Sister    Healthy Sister    Healthy Sister    Prostate cancer Other        paternal grandmother's brother; dx in early 60s   Breast cancer Neg Hx     CURRENT MEDICATIONS:  Current Outpatient Medications  Medication Sig Dispense Refill   Acetylcysteine (MUCOMYST-10 IN) Inhale 1 vial into the lungs every three (3) days as needed (copd).     albuterol (VENTOLIN HFA) 108 (90 Base) MCG/ACT inhaler Inhale 2 puffs into the lungs every 6 (six) hours as needed for wheezing or shortness of breath. 18 g 3   amLODipine (NORVASC) 5 MG tablet Take 5 mg by mouth at bedtime.      aspirin EC 81 MG tablet Take 1 tablet (81 mg total) by mouth daily with breakfast. 30 tablet 11   atorvastatin (LIPITOR) 40 MG tablet Take 1 tablet (40 mg total) by mouth daily. 30 tablet 1   ferrous sulfate 325 (65 FE) MG tablet Take 325 mg by mouth every other day.     Fluticasone-Umeclidin-Vilant (TRELEGY ELLIPTA) 100-62.5-25 MCG/ACT AEPB Inhale 1 puff into the lungs daily at 2 PM. 28 each 3   ipratropium (ATROVENT HFA) 17 MCG/ACT inhaler Inhale 2 puffs into the lungs every 6 (six) hours.     levalbuterol (XOPENEX) 1.25 MG/3ML nebulizer solution Inhale into the lungs.     magic mouthwash SOLN Take 10 mLs by mouth 3 (three) times daily as needed (sore throat).  250 mL 1   magnesium oxide (MAG-OX) 400 (240 Mg) MG tablet Take 1 tablet by mouth twice daily (Patient taking differently: Take 400 mg by mouth 2 (two) times daily.) 120 tablet 3   niraparib tosylate (ZEJULA) 200 MG TABS Take 1 tablet by mouth daily. 30 tablet 9   nortriptyline (PAMELOR) 25 MG capsule Take 25 mg by mouth at bedtime.     OXYGEN Inhale 3-4 L into the lungs continuous.     pantoprazole (PROTONIX) 40 MG tablet Take 1 tablet (40 mg total) by mouth 2 (two)   times daily. (Patient taking differently: Take 40 mg by mouth daily.) 180 tablet 3   polyethylene glycol-electrolytes (TRILYTE) 420 g solution Take 4,000 mLs by mouth as directed. 4000 mL 0   primidone (MYSOLINE) 50 MG tablet Take 50 mg by mouth at bedtime.     No current facility-administered medications for this visit.    ALLERGIES:  No Known Allergies  PHYSICAL EXAM:  Performance status (ECOG): 1 - Symptomatic but completely ambulatory  Vitals:   08/27/22 1515  BP: 126/82  Pulse: 97  Resp: 18  Temp: 97.8 F (36.6 C)  SpO2: 94%   Wt Readings from Last 3 Encounters:  08/27/22 116 lb 4.8 oz (52.8 kg)  08/26/22 116 lb 4.8 oz (52.8 kg)  08/01/22 117 lb 4.6 oz (53.2 kg)   Physical Exam Vitals reviewed.  Constitutional:      Appearance: Normal appearance.     Interventions: Nasal cannula in place.     Comments: In wheelchair  Cardiovascular:     Rate and Rhythm: Normal rate and regular rhythm.     Pulses: Normal pulses.     Heart sounds: Normal heart sounds.  Pulmonary:     Effort: Pulmonary effort is normal.     Breath sounds: Normal breath sounds.     Comments: Hoarse breath sounds bilateral bases Neurological:     General: No focal deficit present.     Mental Status: She is alert and oriented to person, place, and time.  Psychiatric:        Mood and Affect: Mood normal.        Behavior: Behavior normal.      LABORATORY DATA:  I have reviewed the labs as listed.     Latest Ref Rng & Units  08/27/2022    3:33 PM 08/15/2022   10:49 AM 08/02/2022    5:19 AM  CBC  WBC 4.0 - 10.5 K/uL 5.9  3.6  5.4   Hemoglobin 12.0 - 15.0 g/dL 8.3  8.6  9.0   Hematocrit 36.0 - 46.0 % 25.2  26.1  27.4   Platelets 150 - 400 K/uL 133  46  185       Latest Ref Rng & Units 08/15/2022   10:49 AM 08/02/2022    5:19 AM 08/01/2022    3:09 AM  CMP  Glucose 70 - 99 mg/dL 110  124  171   BUN 8 - 23 mg/dL 7  12  10   Creatinine 0.44 - 1.00 mg/dL 0.66  0.62  0.64   Sodium 135 - 145 mmol/L 135  137  135   Potassium 3.5 - 5.1 mmol/L 3.8  3.4  3.7   Chloride 98 - 111 mmol/L 97  96  97   CO2 22 - 32 mmol/L 30  32  30   Calcium 8.9 - 10.3 mg/dL 8.3  8.7  8.7   Total Protein 6.5 - 8.1 g/dL 6.4   6.6   Total Bilirubin 0.3 - 1.2 mg/dL 0.9   0.5   Alkaline Phos 38 - 126 U/L 89   88   AST 15 - 41 U/L 24   24   ALT 0 - 44 U/L 13   10     DIAGNOSTIC IMAGING:  I have independently reviewed the scans and discussed with the patient. CT Angio Chest PE W and/or Wo Contrast  Result Date: 07/31/2022 CLINICAL DATA:  Chest tightness short of breath EXAM: CT ANGIOGRAPHY CHEST WITH CONTRAST TECHNIQUE: Multidetector CT imaging of the   chest was performed using the standard protocol during bolus administration of intravenous contrast. Multiplanar CT image reconstructions and MIPs were obtained to evaluate the vascular anatomy. RADIATION DOSE REDUCTION: This exam was performed according to the departmental dose-optimization program which includes automated exposure control, adjustment of the mA and/or kV according to patient size and/or use of iterative reconstruction technique. CONTRAST:  77m OMNIPAQUE IOHEXOL 350 MG/ML SOLN COMPARISON:  Chest x-ray 07/31/2022, CT chest 06/28/2022, 05/03/2022, 10/30/2021 FINDINGS: Cardiovascular: Satisfactory opacification of the pulmonary arteries to the segmental level. No evidence of pulmonary embolism. Moderate aortic atherosclerosis. No aneurysm or dissection. Mild coronary vascular  calcification. Left-sided central venous catheter with tip at the cavoatrial region. Mediastinum/Nodes: Midline trachea. No thyroid mass. No suspicious lymph nodes. Mild circumferential thickening of the distal esophagus with small hiatal hernia. Lungs/Pleura: Emphysema with probable scarring at the apices. Bronchiectasis within the lingula and left lower lobe. Bronchial wall thickening most evident at the left lower lobe. Patchy mucous plugging. Tree-in-bud density within the anterior left upper lobe, subpleural right lower lobe and left lower lobe. No interval consolidation, pleural effusion, or pneumothorax. Upper Abdomen: No acute abnormality. Musculoskeletal: No chest wall abnormality. No acute or significant osseous findings. Review of the MIP images confirms the above findings. IMPRESSION: 1. Negative for acute pulmonary embolus or aortic dissection 2. Emphysema. Bronchiectasis at the lingula and left lower lobe with bronchial wall thickening and tree-in-bud nodularity as seen on multiple priors and likely due to chronic atypical infection. No interval consolidative pneumonia. 3. Circumferential thickening of the distal esophagus with small hiatal hernia, this may be correlated with endoscopy if not already performed. Aortic Atherosclerosis (ICD10-I70.0) and Emphysema (ICD10-J43.9). Electronically Signed   By: KDonavan FoilM.D.   On: 07/31/2022 21:18   DG Chest 2 View  Result Date: 07/31/2022 CLINICAL DATA:  Increasing shortness of breath over the last 2-3 months with a new nonproductive cough EXAM: CHEST - 2 VIEW COMPARISON:  Chest radiograph 07/17/2022 FINDINGS: No pleural effusion. No pneumothorax. Unchanged cardiac and mediastinal contours. No new focal airspace opacity. Chronic emphysematous change and bibasilar bronchiectasis again noted. Left chest wall port with the tip in the lower SVC. No acute osseous abnormality. IMPRESSION: No active cardiopulmonary disease. Electronically Signed   By:  HMarin RobertsM.D.   On: 07/31/2022 15:10     ASSESSMENT:  1.  Peritoneal carcinomatosis: -Presentation to the ER with right lower quadrant abdominal pain on and off for 1 month. -CTAP on 05/29/2020 showed extensive nodularity throughout the omentum and upper peritoneal cavity.  Both ovaries are prominent although no well-defined mass is noted.  Small volume ascites. -CA-125 is elevated at 2407.  CEA was 2.4. -CT of the chest shows 11 mm right paratracheal lymph node and 11 mm short axis precarinal lymph node.  Subcarinal lymphadenopathy measuring 14 mm short axis.  This is suspicious for metastatic disease. 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 -Cycle 1 of carboplatin and paclitaxel on 07/05/2020. -Evaluated by Dr. RDenman Georgeon 07/07/2020.  Reevaluation after 3 cycles. -CT CAP from 09/01/2020 showed mild improvement with reduction in the omental caking.  Stable to minimally reduced adenopathy in the chest and abdomen.  New 4 mm right upper lobe lung nodule could be inflammatory. -CT CAP on 11/07/2019 and after 6 cycles showed improved peritoneal disease/omental caking.  9 mm short axis portacaval lymph node and adjacent upper abdominal lymph nodes measuring 12 mm  in short axis.  Small retroperitoneal lymph nodes up to 5 mm improved.  Peritoneal disease/omental caking now measures 2.4 cm in thickness. -CA-125 on 10/18/2020 improved 52. -Plan for surgical resection after 6 cycles.  Unfortunately she was not cleared for surgery by anesthesia/pulmonary. - CT CAP on 02/12/2021 showed peritoneal disease and abdominal lymph nodes have improved.  However new masslike areas in the left upper lobe, new since January 31 CT scan along with borderline enlargement of the left supraclavicular lymph node. - She underwent bronchoscopy and biopsy on 03/09/2021 which was  benign. - Niraparib 200 mg daily started around 05/25/2021. - Germline mutation testing was negative.   2.  COPD: -Quit smoking 6 years ago.  Smoked 2 packs/day for 25-30 years. -Has been on 3 L/min oxygen via nasal cannula for the last 5 years.   3.  Family history: -Paternal grandmother with colon cancer.   PLAN:  1.  High-grade serous ovarian carcinoma: - She is tolerating niraparib 200 mg daily very well. - CTAP on 06/04/2022 showed stable nodularity in the greater omentum with no new peritoneal implants. - Reviewed her recent hospitalization records.  CT angio chest scan was negative for PE. - Reviewed labs from 08/15/2022 which showed normal LFTs and creatinine.  CBC shows platelet count 46, white count 3.6 with normal ANC.  Hemoglobin was 8.6.  CA125 was normal at 36. - Ferritin was 761 and percent saturation is 23. - She has anemia from myelosuppression from Zejula. - Due to acute drop in the platelet count, I have repeated CBC today which showed platelet count improved to 133.  LDH is elevated at 193.  We have sent B12, folic acid and MMA which are pending. - Recommend continuing niraparib 200 mg daily.  RTC 2 months for follow-up.  We will plan to repeat CTAP with contrast and tumor marker prior to next visit.    2.  Normocytic anemia: - Ferritin and iron panel were normal.  Hemoglobin is 8.6, likely from myelosuppression from niraparib.  We will check B12, folic acid, MMA.   3.  Peripheral neuropathy: - Continue primidone.  Gabapentin was discontinued.   4.  Severe COPD: - Continue prednisone, Trelegy and Xopenex.   5.  Hypomagnesemia: - Continue magnesium twice daily.   Orders placed this encounter:  Orders Placed This Encounter  Procedures   CT Abdomen Pelvis W Contrast   Vitamin B12   CBC   Folate   Lactate dehydrogenase   Methylmalonic acid, serum   CBC with Differential/Platelet   Comprehensive metabolic panel   CA 125      Sreedhar Katragadda,  MD [facility] [phone]       

## 2022-08-29 ENCOUNTER — Other Ambulatory Visit (HOSPITAL_COMMUNITY): Payer: Self-pay

## 2022-08-29 LAB — METHYLMALONIC ACID, SERUM: Methylmalonic Acid, Quantitative: 335 nmol/L (ref 0–378)

## 2022-08-30 ENCOUNTER — Encounter (HOSPITAL_COMMUNITY)
Admission: RE | Admit: 2022-08-30 | Discharge: 2022-08-30 | Disposition: A | Discharge status: Home / Self Care | Payer: Medicare HMO | Source: Ambulatory Visit | Attending: Gastroenterology | Admitting: Gastroenterology

## 2022-08-30 ENCOUNTER — Encounter (HOSPITAL_COMMUNITY): Payer: Self-pay

## 2022-08-30 NOTE — Patient Instructions (Signed)
[name]  08/30/2022     '@PREFPERIOPPHARMACY'$ @   Your procedure is scheduled on 09/03/2022.  Report to [facility] at 7:00 A.M.  Call this number if you have problems the morning of surgery:  [phone]  If you experience any cold or flu symptoms such as cough, fever, chills, shortness of breath, etc. between now and your scheduled surgery, please notify us at the above number.   Remember:  Please follow the diet and prep instructions given to you by Dr Villa Herb office.     Take these medicines the morning of surgery with A SIP OF WATER : Protonix    Do not wear jewelry, make-up or nail polish.  Do not wear lotions, powders, or perfumes, or deodorant.  Do not shave 48 hours prior to surgery.  Men may shave face and neck.  Do not bring valuables to the hospital.  [facility] is not responsible for any belongings or valuables.  Contacts, dentures or bridgework may not be worn into surgery.  Leave your suitcase in the car.  After surgery it may be brought to your room.  For patients admitted to the hospital, discharge time will be determined by your treatment team.  Patients discharged the day of surgery will not be allowed to drive home.   Name and phone number of your driver:   family Special instructions:  N/A  Please read over the following fact sheets that you were given. Care and Recovery After Surgery Upper Endoscopy, Adult Upper endoscopy is a procedure to look inside the upper GI (gastrointestinal) tract. The upper GI tract is made up of: The esophagus. This is the part of the body that moves food from your mouth to your stomach. The stomach. The duodenum. This is the first part of your small intestine. This procedure is also called esophagogastroduodenoscopy (EGD) or gastroscopy. In this procedure, your health care provider passes a thin, flexible tube (endoscope) through your mouth and down your esophagus into your stomach and into your duodenum. A small  camera is attached to the end of the tube. Images from the camera appear on a monitor in the exam room. During this procedure, your health care provider may also remove a small piece of tissue to be sent to a lab and examined under a microscope (biopsy). Your health care provider may do an upper endoscopy to diagnose cancers of the upper GI tract. You may also have this procedure to find the cause of other conditions, such as: Stomach pain. Heartburn. Pain or problems when swallowing. Nausea and vomiting. Stomach bleeding. Stomach ulcers. Tell a health care provider about: Any allergies you have. All medicines you are taking, including vitamins, herbs, eye drops, creams, and over-the-counter medicines. Any problems you or family members have had with anesthetic medicines. Any bleeding problems you have. Any surgeries you have had. Any medical conditions you have. Whether you are pregnant or may be pregnant. What are the risks? Your healthcare provider will talk with you about risks. These may include: Infection. Bleeding. Allergic reactions to medicines. A tear or hole (perforation) in the esophagus, stomach, or duodenum. What happens before the procedure? When to stop eating and drinking Follow instructions from your health care provider about what you may eat and drink. These may include: 8 hours before your procedure Stop eating most foods. Do not eat meat, fried foods, or fatty foods. Eat only light foods, such as toast or crackers. All liquids are okay except energy drinks and alcohol.  6 hours before your procedure Stop eating. Drink only clear liquids, such as water, clear fruit juice, black coffee, plain tea, and sports drinks. Do not drink energy drinks or alcohol. 2 hours before your procedure Stop drinking all liquids. You may be allowed to take medicines with small sips of water. If you do not follow your health care provider's instructions, your procedure may be  delayed or canceled. Medicines Ask your health care provider about: Changing or stopping your regular medicines. This is especially important if you are taking diabetes medicines or blood thinners. Taking medicines such as aspirin and ibuprofen. These medicines can thin your blood. Do not take these medicines unless your health care provider tells you to take them. Taking over-the-counter medicines, vitamins, herbs, and supplements. General instructions If you will be going home right after the procedure, plan to have a responsible adult: Take you home from the hospital or clinic. You will not be allowed to drive. Care for you for the time you are told. What happens during the procedure?  An IV will be inserted into one of your veins. You may be given one or more of the following: A medicine to help you relax (sedative). A medicine to numb the throat (local anesthetic). You will lie on your left side on an exam table. Your health care provider will pass the endoscope through your mouth and down your esophagus. Your health care provider will use the scope to check the inside of your esophagus, stomach, and duodenum. Biopsies may be taken. The endoscope will be removed. The procedure may vary among health care providers and hospitals. What happens after the procedure? Your blood pressure, heart rate, breathing rate, and blood oxygen level will be monitored until you leave the hospital or clinic. When your throat is no longer numb, you may be given some fluids to drink. If you were given a sedative during the procedure, it can affect you for several hours. Do not drive or operate machinery until your health care provider says that it is safe. It is up to you to get the results of your procedure. Ask your health care provider, or the department that is doing the procedure, when your results will be ready. Contact a health care provider if you: Have a sore throat that lasts longer than 1  day. Have a fever. Get help right away if you: Vomit blood or your vomit looks like coffee grounds. Have bloody, black, or tarry stools. Have a very bad sore throat or you cannot swallow. Have difficulty breathing or very bad pain in your chest or abdomen. These symptoms may be an emergency. Get help right away. Call 911. Do not wait to see if the symptoms will go away. Do not drive yourself to the hospital. Summary Upper endoscopy is a procedure to look inside the upper GI tract. During the procedure, an IV will be inserted into one of your veins. You may be given a medicine to help you relax. The endoscope will be passed through your mouth and down your esophagus. Follow instructions from your health care provider about what you can eat and drink. This information is not intended to replace advice given to you by your health care provider. Make sure you discuss any questions you have with your health care provider. Document Revised: 02/13/2022 Document Reviewed: 02/13/2022 Elsevier Patient Education  [place]. Colonoscopy, Adult A colonoscopy is a procedure to look at the entire large intestine. This procedure is done using a long,  thin, flexible tube that has a camera on the end. You may have a colonoscopy: As a part of normal colorectal screening. If you have certain symptoms, such as: A low number of red blood cells in your blood (anemia). Diarrhea that does not go away. Pain in your abdomen. Blood in your stool. A colonoscopy can help screen for and diagnose medical problems, including: An abnormal growth of cells or tissue (tumor). Abnormal growths within the lining of your intestine (polyps). Inflammation. Areas of bleeding. Tell your health care provider about: Any allergies you have. All medicines you are taking, including vitamins, herbs, eye drops, creams, and over-the-counter medicines. Any problems you or family members have had with anesthetic  medicines. Any bleeding problems you have. Any surgeries you have had. Any medical conditions you have. Any problems you have had with having bowel movements. Whether you are pregnant or may be pregnant. What are the risks? Generally, this is a safe procedure. However, problems may occur, including: Bleeding. Damage to your intestine. Allergic reactions to medicines given during the procedure. Infection. This is rare. What happens before the procedure? Eating and drinking restrictions Follow instructions from your health care provider about eating or drinking restrictions, which may include: A few days before the procedure: Follow a low-fiber diet. Avoid nuts, seeds, dried fruit, raw fruits, and vegetables. 1-3 days before the procedure: Eat only gelatin dessert or ice pops. Drink only clear liquids, such as water, clear juice, clear broth or bouillon, black coffee or tea, or clear soft drinks or sports drinks. Avoid liquids that contain red or purple dye. The day of the procedure: Do not eat solid foods. You may continue to drink clear liquids until up to 2 hours before the procedure. Do not eat or drink anything starting 2 hours before the procedure, or within the time period that your health care provider recommends. Bowel prep If you were prescribed a bowel prep to take by mouth (orally) to clean out your colon: Take it as told by your health care provider. Starting the day before your procedure, you will need to drink a large amount of liquid medicine. The liquid will cause you to have many bowel movements of loose stool until your stool becomes almost clear or light green. If your skin or the opening between the buttocks (anus) gets irritated from diarrhea, you may relieve the irritation using: Wipes with medicine in them, such as adult wet wipes with aloe and vitamin E. A product to soothe skin, such as petroleum jelly. If you vomit while drinking the bowel prep: Take a break  for up to 60 minutes. Begin the bowel prep again. Call your health care provider if you keep vomiting or you cannot take the bowel prep without vomiting. To clean out your colon, you may also be given: Laxative medicines. These help you have a bowel movement. Instructions for enema use. An enema is liquid medicine injected into your rectum. Medicines Ask your health care provider about: Changing or stopping your regular medicines or supplements. This is especially important if you are taking iron supplements, diabetes medicines, or blood thinners. Taking medicines such as aspirin and ibuprofen. These medicines can thin your blood. Do not take these medicines unless your health care provider tells you to take them. Taking over-the-counter medicines, vitamins, herbs, and supplements. General instructions Ask your health care provider what steps will be taken to help prevent infection. These may include washing skin with a germ-killing soap. If you will be going home  right after the procedure, plan to have a responsible adult: Take you home from the hospital or clinic. You will not be allowed to drive. Care for you for the time you are told. What happens during the procedure?  An IV will be inserted into one of your veins. You will be given a medicine to make you fall asleep (general anesthetic). You will lie on your side with your knees bent. A lubricant will be put on the tube. Then the tube will be: Inserted into your anus. Gently eased through all parts of your large intestine. Air will be sent into your colon to keep it open. This may cause some pressure or cramping. Images will be taken with the camera and will appear on a screen. A small tissue sample may be removed to be looked at under a microscope (biopsy). The tissue may be sent to a lab for testing if any signs of problems are found. If small polyps are found, they may be removed and checked for cancer cells. When the procedure  is finished, the tube will be removed. The procedure may vary among health care providers and hospitals. What happens after the procedure? Your blood pressure, heart rate, breathing rate, and blood oxygen level will be monitored until you leave the hospital or clinic. You may have a small amount of blood in your stool. You may pass gas and have mild cramping or bloating in your abdomen. This is caused by the air that was used to open your colon during the exam. If you were given a sedative during the procedure, it can affect you for several hours. Do not drive or operate machinery until your health care provider says that it is safe. It is up to you to get the results of your procedure. Ask your health care provider, or the department that is doing the procedure, when your results will be ready. Summary A colonoscopy is a procedure to look at the entire large intestine. Follow instructions from your health care provider about eating and drinking before the procedure. If you were prescribed an oral bowel prep to clean out your colon, take it as told by your health care provider. During the colonoscopy, a flexible tube with a camera on its end is inserted into the anus and then passed into all parts of the large intestine. This information is not intended to replace advice given to you by your health care provider. Make sure you discuss any questions you have with your health care provider. Document Revised: 10/29/2021 Document Reviewed: 06/27/2021 Elsevier Patient Education  [place].

## 2022-09-02 ENCOUNTER — Other Ambulatory Visit (HOSPITAL_COMMUNITY): Payer: Self-pay

## 2022-09-03 ENCOUNTER — Encounter (HOSPITAL_COMMUNITY): Payer: Self-pay | Admitting: Gastroenterology

## 2022-09-03 ENCOUNTER — Encounter (HOSPITAL_COMMUNITY)
Admission: RE | Disposition: A | Discharge status: Home / Self Care | Payer: Self-pay | Source: Home / Self Care | Attending: Gastroenterology

## 2022-09-03 ENCOUNTER — Ambulatory Visit (HOSPITAL_BASED_OUTPATIENT_CLINIC_OR_DEPARTMENT_OTHER): Payer: Medicare HMO | Admitting: Anesthesiology

## 2022-09-03 ENCOUNTER — Ambulatory Visit (HOSPITAL_COMMUNITY): Payer: Medicare HMO | Admitting: Anesthesiology

## 2022-09-03 ENCOUNTER — Other Ambulatory Visit: Payer: Self-pay

## 2022-09-03 ENCOUNTER — Ambulatory Visit (HOSPITAL_COMMUNITY)
Admission: RE | Admit: 2022-09-03 | Discharge: 2022-09-03 | Disposition: A | Discharge status: Home / Self Care | Payer: Medicare HMO | Attending: Gastroenterology | Admitting: Gastroenterology

## 2022-09-03 DIAGNOSIS — K449 Diaphragmatic hernia without obstruction or gangrene: Secondary | ICD-10-CM

## 2022-09-03 DIAGNOSIS — R1310 Dysphagia, unspecified: Secondary | ICD-10-CM | POA: Insufficient documentation

## 2022-09-03 DIAGNOSIS — K219 Gastro-esophageal reflux disease without esophagitis: Secondary | ICD-10-CM | POA: Insufficient documentation

## 2022-09-03 DIAGNOSIS — Z7951 Long term (current) use of inhaled steroids: Secondary | ICD-10-CM | POA: Diagnosis not present

## 2022-09-03 DIAGNOSIS — K5730 Diverticulosis of large intestine without perforation or abscess without bleeding: Secondary | ICD-10-CM | POA: Diagnosis not present

## 2022-09-03 DIAGNOSIS — I10 Essential (primary) hypertension: Secondary | ICD-10-CM | POA: Diagnosis not present

## 2022-09-03 DIAGNOSIS — D649 Anemia, unspecified: Secondary | ICD-10-CM | POA: Insufficient documentation

## 2022-09-03 DIAGNOSIS — Z79899 Other long term (current) drug therapy: Secondary | ICD-10-CM | POA: Insufficient documentation

## 2022-09-03 DIAGNOSIS — I739 Peripheral vascular disease, unspecified: Secondary | ICD-10-CM | POA: Diagnosis not present

## 2022-09-03 DIAGNOSIS — K5790 Diverticulosis of intestine, part unspecified, without perforation or abscess without bleeding: Secondary | ICD-10-CM

## 2022-09-03 DIAGNOSIS — Q438 Other specified congenital malformations of intestine: Secondary | ICD-10-CM | POA: Insufficient documentation

## 2022-09-03 DIAGNOSIS — K2289 Other specified disease of esophagus: Secondary | ICD-10-CM | POA: Insufficient documentation

## 2022-09-03 DIAGNOSIS — Z87891 Personal history of nicotine dependence: Secondary | ICD-10-CM | POA: Insufficient documentation

## 2022-09-03 DIAGNOSIS — Z9981 Dependence on supplemental oxygen: Secondary | ICD-10-CM | POA: Diagnosis not present

## 2022-09-03 DIAGNOSIS — K209 Esophagitis, unspecified without bleeding: Secondary | ICD-10-CM

## 2022-09-03 DIAGNOSIS — J449 Chronic obstructive pulmonary disease, unspecified: Secondary | ICD-10-CM | POA: Diagnosis not present

## 2022-09-03 HISTORY — PX: ESOPHAGOGASTRODUODENOSCOPY (EGD) WITH PROPOFOL: SHX5813

## 2022-09-03 HISTORY — PX: ESOPHAGEAL BRUSHING: SHX6842

## 2022-09-03 HISTORY — PX: COLONOSCOPY WITH PROPOFOL: SHX5780

## 2022-09-03 HISTORY — PX: BIOPSY: SHX5522

## 2022-09-03 LAB — KOH PREP: KOH Prep: NONE SEEN

## 2022-09-03 LAB — HM COLONOSCOPY

## 2022-09-03 SURGERY — COLONOSCOPY WITH PROPOFOL
Anesthesia: General

## 2022-09-03 MED ORDER — LACTATED RINGERS IV SOLN: INTRAVENOUS | Status: DC

## 2022-09-03 MED ORDER — PHENYLEPHRINE HCL (PRESSORS) 10 MG/ML IV SOLN
INTRAVENOUS | Status: DC | PRN
  Administered 2022-09-03: 80 ug via INTRAVENOUS
  Administered 2022-09-03 (×2): 160 ug via INTRAVENOUS

## 2022-09-03 MED ORDER — LIDOCAINE HCL (CARDIAC) PF 50 MG/5ML IV SOSY
PREFILLED_SYRINGE | INTRAVENOUS | Status: DC | PRN
  Administered 2022-09-03: 100 mg via INTRAVENOUS

## 2022-09-03 MED ORDER — PROPOFOL 10 MG/ML IV BOLUS
INTRAVENOUS | Status: DC | PRN
  Administered 2022-09-03: 100 mg via INTRAVENOUS
  Administered 2022-09-03 (×2): 20 mg via INTRAVENOUS
  Administered 2022-09-03: 30 mg via INTRAVENOUS
  Administered 2022-09-03: 40 mg via INTRAVENOUS

## 2022-09-03 MED ORDER — PROPOFOL 500 MG/50ML IV EMUL
INTRAVENOUS | Status: DC | PRN
  Administered 2022-09-03: 75 ug/kg/min via INTRAVENOUS

## 2022-09-03 NOTE — Transfer of Care (Signed)
Immediate Anesthesia Transfer of Care Note  Patient: [name]  Procedure(s) Performed: COLONOSCOPY WITH PROPOFOL ESOPHAGOGASTRODUODENOSCOPY (EGD) WITH PROPOFOL BIOPSY ESOPHAGEAL BRUSHING  Patient Location: Short Stay  Anesthesia Type:General  Level of Consciousness: awake and patient cooperative  Airway & Oxygen Therapy: Patient Spontanous Breathing and Patient connected to nasal cannula oxygen  Post-op Assessment: Report given to RN and Post -op Vital signs reviewed and stable  Post vital signs: Reviewed and stable  Last Vitals:  Vitals Value Taken Time  BP 96/59 0949  Temp 98.4 0949  Pulse 88 0949  Resp 16 0949  SpO2 99 0949    Last Pain:  Vitals:   09/03/22 0858  PainSc: 0-No pain         Complications: No notable events documented.

## 2022-09-03 NOTE — Interval H&P Note (Signed)
History and Physical Interval Note:  09/03/2022 7:37 AM  [name]  has presented today for surgery, with the diagnosis of Screening Colonoscopy Esohagitis.  The various methods of treatment have been discussed with the patient and family. After consideration of risks, benefits and other options for treatment, the patient has consented to  Procedure(s) with comments: COLONOSCOPY WITH PROPOFOL (N/A) - 900 ASA 3 ESOPHAGOGASTRODUODENOSCOPY (EGD) WITH PROPOFOL (N/A) as a surgical intervention.  The patient's history has been reviewed, patient examined, no change in status, stable for surgery.  I have reviewed the patient's chart and labs.  Questions were answered to the patient's satisfaction.     [name]

## 2022-09-03 NOTE — Anesthesia Procedure Notes (Signed)
Date/Time: 09/03/2022 8:56 AM  Performed by: Vista Deck, CRNAPre-anesthesia Checklist: Patient identified, Emergency Drugs available, Suction available, Timeout performed and Patient being monitored Patient Re-evaluated:Patient Re-evaluated prior to induction Oxygen Delivery Method: Nasal Cannula

## 2022-09-03 NOTE — Op Note (Signed)
[facility] Patient Name: [name] Procedure Date: 09/03/2022 8:52 AM MRN: [id number] Date of Birth: [birth year] Attending MD: [name] ,  CSN: [id number] Age: 66 Admit Type: Outpatient Procedure:                Upper GI endoscopy Indications:              Odynophagia, Follow-up of gastro-esophageal reflux                            disease Providers:                [name], Charlsie Quest. Theda Sers RN, RN, Ladoris Gene Technician, Technician, Aram Candela Referring MD:              Medicines:                Monitored Anesthesia Care Complications:            No immediate complications. Estimated Blood Loss:     Estimated blood loss: none. Procedure:                Pre-Anesthesia Assessment:                           - Prior to the procedure, a History and Physical                            was performed, and patient medications, allergies                            and sensitivities were reviewed. The patient's                            tolerance of previous anesthesia was reviewed.                           - The risks and benefits of the procedure and the                            sedation options and risks were discussed with the                            patient. All questions were answered and informed                            consent was obtained.                           - ASA Grade Assessment: III - A patient with severe                            systemic disease.                           After obtaining informed consent, the endoscope was  passed under direct vision. Throughout the                            procedure, the patient's blood pressure, pulse, and                            oxygen saturations were monitored continuously. The                            GIF-H190 ([id number]) scope was introduced through the                            mouth, and advanced to the second part of duodenum.                             The upper GI endoscopy was accomplished without                            difficulty. The patient tolerated the procedure                            well. Scope In: 9:05:28 AM Scope Out: 9:13:07 AM Total Procedure Duration: 0 hours 7 minutes 39 seconds  Findings:      White nummular lesions were noted in the middle third of the esophagus.       A brush was used to obtain samples.      The esophagus and gastroesophageal junction were examined with white       light and narrow band imaging (NBI). There were esophageal mucosal       changes suspicious for short-segment Barrett's esophagus. These changes       involved the mucosa at the upper extent of the gastric folds (30 cm from       the incisors) extending to the Z-line (29 cm from the incisors). One       tongue of salmon-colored mucosa was present from 29 to 30 cm. The       maximum longitudinal extent of these esophageal mucosal changes was 1 cm       in length. Mucosa was biopsied with a cold forceps for histology. One       specimen bottle was sent to pathology.      A 4 cm hiatal hernia was present.      The gastroesophageal flap valve was visualized endoscopically and       classified as Hill Grade IV (no fold, wide open lumen, hiatal hernia       present).      The entire examined stomach was normal.      The examined duodenum was normal. Impression:               - White nummular lesions in esophageal mucosa.                            Brushings performed.                           - Esophageal mucosal changes suspicious for  short-segment Barrett's esophagus. Biopsied.                           - 4 cm hiatal hernia.                           - Gastroesophageal flap valve classified as Hill                            Grade IV (no fold, wide open lumen, hiatal hernia                            present).                           - Normal stomach.                           -  Normal examined duodenum. Moderate Sedation:      Per Anesthesia Care Recommendation:           - Discharge patient to home (ambulatory).                           - Resume previous diet.                           - Await pathology results.                           - Continue present medications. Procedure Code(s):        --- Professional ---                           [phone], Esophagogastroduodenoscopy, flexible,                            transoral; with biopsy, single or multiple Diagnosis Code(s):        --- Professional ---                           K22.8, Other specified diseases of esophagus                           K44.9, Diaphragmatic hernia without obstruction or                            gangrene                           R13.10, Dysphagia, unspecified                           K21.9, Gastro-esophageal reflux disease without                            esophagitis CPT copyright 2019 American Medical Association. All rights reserved. The codes documented in this report are preliminary and upon coder review may  be revised  to meet current compliance requirements. [name], MD [name],  09/03/2022 9:49:05 AM This report has been signed electronically. Number of Addenda: 0

## 2022-09-03 NOTE — Anesthesia Preprocedure Evaluation (Signed)
Anesthesia Evaluation  Patient identified by MRN, date of birth, ID band Patient awake    Reviewed: Allergy & Precautions, H&P , NPO status , Patient's Chart, lab work & pertinent test results, reviewed documented beta blocker date and time   Airway Mallampati: II  TM Distance: >3 FB Neck ROM: full    Dental no notable dental hx.    Pulmonary asthma , COPD,  oxygen dependent, former smoker,    Pulmonary exam normal breath sounds clear to auscultation       Cardiovascular Exercise Tolerance: Good hypertension, + Peripheral Vascular Disease   Rhythm:regular Rate:Normal     Neuro/Psych  Neuromuscular disease negative psych ROS   GI/Hepatic negative GI ROS, Neg liver ROS,   Endo/Other  negative endocrine ROS  Renal/GU negative Renal ROS  negative genitourinary   Musculoskeletal   Abdominal   Peds  Hematology  (+) Blood dyscrasia, anemia ,   Anesthesia Other Findings Home O2 4L  Reproductive/Obstetrics negative OB ROS                             Anesthesia Physical  Anesthesia Plan  ASA: 4  Anesthesia Plan: General   Post-op Pain Management:    Induction:   PONV Risk Score and Plan: Propofol infusion  Airway Management Planned:   Additional Equipment:   Intra-op Plan:   Post-operative Plan:   Informed Consent: I have reviewed the patients History and Physical, chart, labs and discussed the procedure including the risks, benefits and alternatives for the proposed anesthesia with the patient or authorized representative who has indicated his/her understanding and acceptance.     Dental Advisory Given  Plan Discussed with: CRNA  Anesthesia Plan Comments:         Anesthesia Quick Evaluation

## 2022-09-03 NOTE — Op Note (Signed)
[facility] Patient Name: [name] Procedure Date: 09/03/2022 8:51 AM MRN: [id number] Date of Birth: 1956-01-13 Attending MD: [name] ,  CSN: [id number] Age: 66 Admit Type: Outpatient Procedure:                Colonoscopy Indications:              Anemia Providers:                [name], Viburnum Theda Sers RN, RN, Ladoris Gene Technician, Technician, Aram Candela Referring MD:              Medicines:                Monitored Anesthesia Care Complications:            No immediate complications. Estimated Blood Loss:     Estimated blood loss: none. Procedure:                Pre-Anesthesia Assessment:                           - Prior to the procedure, a History and Physical                            was performed, and patient medications, allergies                            and sensitivities were reviewed. The patient's                            tolerance of previous anesthesia was reviewed.                           - The risks and benefits of the procedure and the                            sedation options and risks were discussed with the                            patient. All questions were answered and informed                            consent was obtained.                           - ASA Grade Assessment: III - A patient with severe                            systemic disease.                           After obtaining informed consent, the colonoscope                            was passed under direct vision. Throughout the  procedure, the patient's blood pressure, pulse, and                            oxygen saturations were monitored continuously. The                            PCF-HQ190L ([id number]) scope was introduced through                            the anus and advanced to the the cecum, identified                            by appendiceal orifice and ileocecal valve. The                             colonoscopy was performed without difficulty. The                            patient tolerated the procedure well. The quality                            of the bowel preparation was adequate. Scope In: 9:20:36 AM Scope Out: 9:43:16 AM Scope Withdrawal Time: 0 hours 9 minutes 34 seconds  Total Procedure Duration: 0 hours 22 minutes 40 seconds  Findings:      The perianal and digital rectal examinations were normal.      Scattered small and large-mouthed diverticula were found in the sigmoid       colon, descending colon and ascending colon.      The colon (entire examined portion) was moderately tortuous.      The retroflexed view of the distal rectum and anal verge was normal and       showed no anal or rectal abnormalities. Impression:               - Diverticulosis in the sigmoid colon, in the                            descending colon and in the ascending colon.                           - Tortuous colon.                           - The distal rectum and anal verge are normal on                            retroflexion view.                           - No specimens collected. Moderate Sedation:      Per Anesthesia Care Recommendation:           - Discharge patient to home (ambulatory).                           - Resume previous diet.                           -  Repeat colonoscopy in 10 years for screening                            purposes.                           - Follow up with hematology regarding anemia. Procedure Code(s):        --- Professional ---                           [id number], Colonoscopy, flexible; diagnostic, including                            collection of specimen(s) by brushing or washing,                            when performed (separate procedure) Diagnosis Code(s):        --- Professional ---                           D64.9, Anemia, unspecified                           K57.30, Diverticulosis of large intestine without                             perforation or abscess without bleeding                           Q43.8, Other specified congenital malformations of                            intestine CPT copyright 2019 American Medical Association. All rights reserved. The codes documented in this report are preliminary and upon coder review may  be revised to meet current compliance requirements. [name], MD [name],  09/03/2022 9:54:21 AM This report has been signed electronically. Number of Addenda: 0

## 2022-09-03 NOTE — Discharge Instructions (Addendum)
You are being discharged to home.  Resume your previous diet.  We are waiting for your pathology results.  Continue your present medications.  Your physician has recommended a repeat colonoscopy in 10 years for screening purposes. Follow up with hematology regarding anemia.

## 2022-09-04 LAB — SURGICAL PATHOLOGY

## 2022-09-04 NOTE — Anesthesia Postprocedure Evaluation (Signed)
Anesthesia Post Note  Patient: NEZZIE MANERA  Procedure(s) Performed: COLONOSCOPY WITH PROPOFOL ESOPHAGOGASTRODUODENOSCOPY (EGD) WITH PROPOFOL BIOPSY ESOPHAGEAL BRUSHING  Patient location during evaluation: Phase II Anesthesia Type: General Level of consciousness: awake Pain management: pain level controlled Vital Signs Assessment: post-procedure vital signs reviewed and stable Respiratory status: spontaneous breathing and respiratory function stable Cardiovascular status: blood pressure returned to baseline and stable Postop Assessment: no headache and no apparent nausea or vomiting Anesthetic complications: no Comments: Late entry   No notable events documented.   Last Vitals:  Vitals:   09/03/22 0739 09/03/22 0948  BP: 121/63 (!) 93/46  Pulse: 93 83  Resp: (!) 22 17  Temp: 37.2 C 37 C  SpO2: 99% 100%    Last Pain:  Vitals:   09/03/22 0948  TempSrc: Oral  PainSc: 0-No pain                 Louann Sjogren

## 2022-09-06 ENCOUNTER — Other Ambulatory Visit: Payer: Self-pay

## 2022-09-06 ENCOUNTER — Encounter (HOSPITAL_COMMUNITY): Payer: Self-pay | Admitting: Gastroenterology

## 2022-09-06 ENCOUNTER — Encounter (INDEPENDENT_AMBULATORY_CARE_PROVIDER_SITE_OTHER): Payer: Self-pay | Admitting: *Deleted

## 2022-09-06 DIAGNOSIS — C569 Malignant neoplasm of unspecified ovary: Secondary | ICD-10-CM

## 2022-09-06 MED ORDER — MAGNESIUM OXIDE -MG SUPPLEMENT 400 (240 MG) MG PO TABS: 1.0000 | ORAL_TABLET | Freq: Two times a day (BID) | ORAL | 3 refills | Status: DC

## 2022-09-06 NOTE — Telephone Encounter (Signed)
Per last office note by Dr. Miki Kins prescription sent for refill.

## 2022-09-10 ENCOUNTER — Encounter: Payer: Self-pay | Admitting: Internal Medicine

## 2022-09-10 ENCOUNTER — Ambulatory Visit (INDEPENDENT_AMBULATORY_CARE_PROVIDER_SITE_OTHER): Payer: Medicare HMO | Admitting: Internal Medicine

## 2022-09-10 DIAGNOSIS — J449 Chronic obstructive pulmonary disease, unspecified: Secondary | ICD-10-CM | POA: Diagnosis not present

## 2022-09-10 DIAGNOSIS — J9622 Acute and chronic respiratory failure with hypercapnia: Secondary | ICD-10-CM

## 2022-09-10 DIAGNOSIS — J9621 Acute and chronic respiratory failure with hypoxia: Secondary | ICD-10-CM | POA: Diagnosis not present

## 2022-09-10 DIAGNOSIS — B370 Candidal stomatitis: Secondary | ICD-10-CM

## 2022-09-10 MED ORDER — PREDNISONE 5 MG (21) PO TBPK: ORAL_TABLET | ORAL | 3 refills | Status: DC

## 2022-09-10 MED ORDER — LEVOFLOXACIN 500 MG PO TABS: 500.0000 mg | ORAL_TABLET | Freq: Every day | ORAL | 0 refills | Status: DC

## 2022-09-10 MED ORDER — BREZTRI AEROSPHERE 160-9-4.8 MCG/ACT IN AERO: 2.0000 | INHALATION_SPRAY | Freq: Two times a day (BID) | RESPIRATORY_TRACT | 11 refills | Status: DC

## 2022-09-10 MED ORDER — CLOTRIMAZOLE 10 MG MT TROC: 10.0000 mg | Freq: Every day | OROMUCOSAL | 2 refills | Status: DC

## 2022-09-10 NOTE — Patient Instructions (Addendum)
Plan A = Automatic = Always=   Breztri in place of trelegy 100  >>> Take 2 puffs first thing in am and then another 2 puffs about 12 hours later and Prednisone 5 mg    Work on inhaler technique:  relax and gently blow all the way out then take a nice smooth full deep breath back in, triggering the inhaler at same time you start breathing in.  Hold breath in for at least  5 seconds if you can. Blow out breztri out  thru nose. Rinse and gargle with water when done.  If mouth or throat bother you at all,  try brushing teeth/gums/tongue with arm and hammer toothpaste/ make a slurry and gargle and spit out.    Plan B = Backup (to supplement plan A, not to replace it) Only use your albuterol inhaler as a rescue medication to be used if you can't catch your breath by resting or doing a relaxed purse lip breathing pattern.  - The less you use it, the better it will work when you need it. - Ok to use the inhaler up to 2 puffs  every 4 hours if you must but call for appointment if use goes up over your usual need - Don't leave home without it !!  (think of it like the spare tire for your car)   Plan C = Crisis (instead of Plan B but only if Plan B stops working) - only use your albuterol nebulizer if you first try Plan B and it fails to help > ok to use the nebulizer up to every 4 hours but if start needing it regularly call for immediate appointment   Plan D = Deltasone  If ABC not working take 20 mg prednisone until better then wean down to  5 mg daily   Make sure you check your oxygen saturation  AT  your highest level of activity (not after you stop)   to be sure it stays over 90% and adjust  02 flow upward to maintain this level if needed but remember to turn it back to previous settings when you stop (to conserve your supply).   Levaquin 500 mg daily x 7 days  For cough/ congestion > mucinex 1200 mg every 12 hours   For thrush> clotrimazole troche 10 mg up to  4 x daily     Please schedule a  follow up visit in 3 months but call sooner if needed

## 2022-09-10 NOTE — Progress Notes (Unsigned)
[name], female    DOB: 10-07-1956    MRN: [id number]   Brief patient profile:  53  yowf  quit smoking 2015 referred to pulmonary clinic in [place]  09/10/2022 by [facility]  for post hosp f/u    Pt of Dr Raul Del with copd GOLD 4  criteria and housbound at baseline/ 02 dep / pred dep at 5 mg with freq exac and "just calls for appt" when flares. Typical flare in seting of increasing cough and congestion x "a few days" and says called for appt with Dr Raul Del but couldn't see her for a few days and then much worse am 9/13 and admit with chest tightness, sob at rest and needed bipap overnight and PCCM consulted am 9/14    History of Present Illness  09/10/2022  Pulmonary/ 1st office eval/ Jahdiel Krol / [place] [facility] trelegy 100 and pred 5 mg  Chief Complaint  Patient presents with   Consult    HFU from [place] patient sees Pulmonary @ [facility]    Dyspnea:  room to room ok / walks to car 25 ft/ walker /02  3lpm  Cough: rattling / slt green never turned clear most the time it stays that way  Sleep: flat bed with a bunch of pillows  SABA use: avg use now is none but no feel for max / neb even less   No obvious day to day or daytime pattern/variability or assoc excess/ purulent sputum or mucus plugs or hemoptysis or cp or chest tightness, subjective wheeze or overt sinus or hb symptoms.   sleeping without nocturnal  or early am exacerbation  of respiratory  c/o's or need for noct saba. Also denies any obvious fluctuation of symptoms with weather or environmental changes or other aggravating or alleviating factors except as outlined above   No unusual exposure hx or h/o childhood pna/ asthma or knowledge of premature birth.  Current Allergies, Complete Past Medical History, Past Surgical History, Family History, and Social History were reviewed in [employer] record.  ROS  The following are not active complaints unless bolded Hoarseness, sore throat, dysphagia, dental  problems, itching, sneezing,  nasal congestion or discharge of excess mucus or purulent secretions, ear ache,   fever, chills, sweats, unintended wt loss or wt gain, classically pleuritic or exertional cp,  orthopnea pnd or arm/hand swelling  or leg swelling, presyncope, palpitations, abdominal pain, anorexia, nausea, vomiting, diarrhea  or change in bowel habits or change in bladder habits, change in stools or change in urine, dysuria, hematuria,  rash, arthralgias, visual complaints, headache, numbness, weakness or ataxia or problems with walking or coordination,  change in mood or  memory.           Past Medical History:  Diagnosis Date   Anemia    Aortic atherosclerosis (HCC)    Arthritis    Asthma    Bilateral carotid artery stenosis    Cancer (HCC)    Gastric Cancer   COPD (chronic obstructive pulmonary disease) (HCC)    High cholesterol    Hyperlipidemia    Hypertension    Neuropathy    Osteoarthritis    Ovarian cancer ([place])    Oxygen dependent    Peripheral vascular disease ([place])    Peritoneal carcinoma ([place])    Pneumonia 2019   Port-A-Cath in place 07/03/2020   Pulmonary emphysema ([place])     Outpatient Medications Prior to Visit  Medication Sig Dispense Refill   albuterol (VENTOLIN HFA) 108 (  90 Base) MCG/ACT inhaler Inhale 2 puffs into the lungs every 6 (six) hours as needed for wheezing or shortness of breath. 18 g 3   amLODipine (NORVASC) 5 MG tablet Take 5 mg by mouth at bedtime.      aspirin EC 81 MG tablet Take 1 tablet (81 mg total) by mouth daily with breakfast. 30 tablet 11   atorvastatin (LIPITOR) 40 MG tablet Take 1 tablet (40 mg total) by mouth daily. 30 tablet 1   ferrous sulfate 325 (65 FE) MG tablet Take 325 mg by mouth every other day.     Fluticasone-Umeclidin-Vilant (TRELEGY ELLIPTA) 100-62.5-25 MCG/ACT AEPB Inhale 1 puff into the lungs daily at 2 PM. 28 each 3   ipratropium (ATROVENT HFA) 17 MCG/ACT inhaler Inhale 2 puffs into the lungs every 6 (six) hours  as needed for wheezing.     levalbuterol (XOPENEX) 1.25 MG/3ML nebulizer solution Take 1.25 mg by nebulization every 6 (six) hours as needed for shortness of breath or wheezing.     magic mouthwash SOLN Take 10 mLs by mouth 3 (three) times daily as needed (sore throat). 250 mL 1   magnesium oxide (MAG-OX) 400 (240 Mg) MG tablet Take 1 tablet (400 mg total) by mouth 2 (two) times daily. 120 tablet 3   niraparib tosylate (ZEJULA) 200 MG TABS Take 1 tablet by mouth daily. 30 tablet 9   nortriptyline (PAMELOR) 25 MG capsule Take 25 mg by mouth at bedtime.     OXYGEN Inhale 3 L into the lungs continuous.     pantoprazole (PROTONIX) 40 MG tablet Take 1 tablet (40 mg total) by mouth 2 (two) times daily. 180 tablet 3   predniSONE (DELTASONE) 5 MG tablet Take 5 mg by mouth daily with breakfast.     primidone (MYSOLINE) 50 MG tablet Take 50 mg by mouth at bedtime.     simvastatin (ZOCOR) 40 MG tablet Take 40 mg by mouth daily.     No facility-administered medications prior to visit.     Objective:     BP 106/62 (BP Location: Left Arm, Patient Position: Sitting)   Pulse (!) 118   Temp 98 F (36.7 C)   Ht 5' (1.524 m)   Wt 116 lb 6.4 oz (52.8 kg)   SpO2 90% Comment: 3LO2 cont  BMI 22.73 kg/m   SpO2: 90 % (3LO2 cont)  W/c bound /rattling cough  very distant bs / severe thrush  HEENT :  Oropharynx  thrush  Nasal turbinates nl    NECK :  without JVD/Nodes/TM/ nl carotid upstrokes bilaterally   LUNGS: no acc muscle use,  Mod barrel  contour chest wall with bilateral  Distant bs s audible wheeze and  without cough on insp or exp maneuvers and mod  Hyperresonant  to  percussion bilaterally     CV:  RRR  no s3 or murmur or increase in P2, and no edema   ABD:  soft and nontender with pos mid insp Hoover's  in the supine position. No bruits or organomegaly appreciated, bowel sounds nl  MS:   Ext warm without deformities or   obvious joint restrictions , calf tenderness, cyanosis or  clubbing  SKIN: warm and dry without lesions    NEURO:  alert, approp, nl sensorium with  no motor or cerebellar deficits apparent.     , I personally reviewed images and agree with radiology impression as follows:   Chest CTa 07/31/22 1. Negative for acute pulmonary embolus or aortic dissection  2. Emphysema. Bronchiectasis at the lingula and left lower lobe with bronchial wall thickening and tree-in-bud nodularity as seen on multiple priors and likely due to chronic atypical infection. No interval consolidative pneumonia. 3. 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       Assessment   COPD GOLD 4/ 02 dep Quit smoking 2015 - pt of Dr Raul Del with copd GOLD 4  criteria and housbound at [place]/ 02 dep / pred dep at 5 mg with freq exac    Group D (now reclassified as E) in terms of symptom/risk and laba/lama/ICS  therefore appropriate rx at this point >>>  trelegy 100 plus floor prednisone -= 5 mg and ceiling 20 mg until sees Dr Raul Del for further instructions   Still having green mucus in am sp  exac /hosp > rec levaquin 500 x 7 days, warned re d/c if joint/ankle pain     Acute on chronic respiratory failure with hypoxia and hypercapnia ([place]) HC03  08/15/22  = 30   Advised  Make sure you check your oxygen saturation  AT  your highest level of activity (not after you stop)   to be sure it stays over 90% (no need for higher)  and adjust  02 flow upward to maintain this level if needed but remember to turn it back to previous settings when you stop (to conserve your supply).   Thrush rx clotrimazole troche prn 09/10/2022 >>>          Each maintenance medication was reviewed in detail including emphasizing most importantly the difference between maintenance and prns and under what circumstances the prns are to be triggered using an action plan format where appropriate.  Total time for H and P,  chart review, counseling, reviewing hfa/neb/dpi/02  device(s) and generating customized AVS unique to this office visit / same day charting > 30 min post hosp f/u           Christinia Gully, MD 09/10/2022

## 2022-09-11 ENCOUNTER — Encounter: Payer: Self-pay | Admitting: Internal Medicine

## 2022-09-11 DIAGNOSIS — B370 Candidal stomatitis: Secondary | ICD-10-CM | POA: Insufficient documentation

## 2022-09-11 NOTE — Assessment & Plan Note (Addendum)
Quit smoking 2015 - pt of Dr Raul Del with copd GOLD 4  criteria and housbound at [place]/ 02 dep / pred dep at 5 mg with freq exac    Group D (now reclassified as E) in terms of symptom/risk and laba/lama/ICS  therefore appropriate rx at this point >>>  trelegy 100 plus floor prednisone -= 5 mg and ceiling 20 mg until sees Dr Raul Del for further instructions   Still having green mucus in am sp  exac /hosp > rec levaquin 500 x 7 days, warned re d/c if joint/ankle pain

## 2022-09-11 NOTE — Assessment & Plan Note (Signed)
HC03  08/15/22  = 30   Advised  Make sure you check your oxygen saturation  AT  your highest level of activity (not after you stop)   to be sure it stays over 90% (no need for higher)  and adjust  02 flow upward to maintain this level if needed but remember to turn it back to previous settings when you stop (to conserve your supply).

## 2022-09-11 NOTE — Assessment & Plan Note (Addendum)
rx clotrimazole troche prn 09/10/2022 >>>          Each maintenance medication was reviewed in detail including emphasizing most importantly the difference between maintenance and prns and under what circumstances the prns are to be triggered using an action plan format where appropriate.  Total time for H and P, chart review, counseling, reviewing hfa/neb/dpi/02  device(s) and generating customized AVS unique to this office visit / same day charting > 30 min post hosp f/u

## 2022-09-12 ENCOUNTER — Other Ambulatory Visit (HOSPITAL_COMMUNITY): Payer: Self-pay

## 2022-09-13 ENCOUNTER — Other Ambulatory Visit (HOSPITAL_COMMUNITY): Payer: Self-pay

## 2022-10-03 ENCOUNTER — Emergency Department (HOSPITAL_COMMUNITY): Payer: Medicare HMO

## 2022-10-03 ENCOUNTER — Inpatient Hospital Stay (HOSPITAL_COMMUNITY)
Admission: EM | Admit: 2022-10-03 | Discharge: 2022-10-10 | DRG: 190 | Disposition: A | Discharge status: Home Health | Payer: Medicare HMO | Attending: Internal Medicine | Admitting: Internal Medicine

## 2022-10-03 ENCOUNTER — Encounter (HOSPITAL_COMMUNITY): Payer: Self-pay | Admitting: Emergency Medicine

## 2022-10-03 ENCOUNTER — Other Ambulatory Visit (HOSPITAL_COMMUNITY): Payer: Self-pay

## 2022-10-03 ENCOUNTER — Other Ambulatory Visit: Payer: Self-pay

## 2022-10-03 DIAGNOSIS — D61818 Other pancytopenia: Secondary | ICD-10-CM | POA: Diagnosis not present

## 2022-10-03 DIAGNOSIS — Z8249 Family history of ischemic heart disease and other diseases of the circulatory system: Secondary | ICD-10-CM

## 2022-10-03 DIAGNOSIS — J9621 Acute and chronic respiratory failure with hypoxia: Secondary | ICD-10-CM | POA: Diagnosis present

## 2022-10-03 DIAGNOSIS — Z7952 Long term (current) use of systemic steroids: Secondary | ICD-10-CM

## 2022-10-03 DIAGNOSIS — F32A Depression, unspecified: Secondary | ICD-10-CM | POA: Diagnosis present

## 2022-10-03 DIAGNOSIS — J440 Chronic obstructive pulmonary disease with acute lower respiratory infection: Secondary | ICD-10-CM | POA: Diagnosis present

## 2022-10-03 DIAGNOSIS — C569 Malignant neoplasm of unspecified ovary: Secondary | ICD-10-CM | POA: Diagnosis present

## 2022-10-03 DIAGNOSIS — E782 Mixed hyperlipidemia: Secondary | ICD-10-CM | POA: Diagnosis present

## 2022-10-03 DIAGNOSIS — J470 Bronchiectasis with acute lower respiratory infection: Secondary | ICD-10-CM | POA: Diagnosis present

## 2022-10-03 DIAGNOSIS — D649 Anemia, unspecified: Secondary | ICD-10-CM

## 2022-10-03 DIAGNOSIS — Z20822 Contact with and (suspected) exposure to covid-19: Secondary | ICD-10-CM | POA: Diagnosis present

## 2022-10-03 DIAGNOSIS — D61810 Antineoplastic chemotherapy induced pancytopenia: Secondary | ICD-10-CM | POA: Diagnosis present

## 2022-10-03 DIAGNOSIS — Z825 Family history of asthma and other chronic lower respiratory diseases: Secondary | ICD-10-CM

## 2022-10-03 DIAGNOSIS — D539 Nutritional anemia, unspecified: Secondary | ICD-10-CM | POA: Diagnosis present

## 2022-10-03 DIAGNOSIS — K219 Gastro-esophageal reflux disease without esophagitis: Secondary | ICD-10-CM | POA: Diagnosis present

## 2022-10-03 DIAGNOSIS — Z79899 Other long term (current) drug therapy: Secondary | ICD-10-CM

## 2022-10-03 DIAGNOSIS — Z85028 Personal history of other malignant neoplasm of stomach: Secondary | ICD-10-CM

## 2022-10-03 DIAGNOSIS — Z7401 Bed confinement status: Secondary | ICD-10-CM

## 2022-10-03 DIAGNOSIS — J439 Emphysema, unspecified: Secondary | ICD-10-CM | POA: Diagnosis present

## 2022-10-03 DIAGNOSIS — T451X5A Adverse effect of antineoplastic and immunosuppressive drugs, initial encounter: Secondary | ICD-10-CM | POA: Diagnosis present

## 2022-10-03 DIAGNOSIS — Z7951 Long term (current) use of inhaled steroids: Secondary | ICD-10-CM

## 2022-10-03 DIAGNOSIS — I700 Atherosclerosis of aorta: Secondary | ICD-10-CM | POA: Diagnosis present

## 2022-10-03 DIAGNOSIS — D509 Iron deficiency anemia, unspecified: Secondary | ICD-10-CM | POA: Diagnosis present

## 2022-10-03 DIAGNOSIS — I739 Peripheral vascular disease, unspecified: Secondary | ICD-10-CM | POA: Diagnosis present

## 2022-10-03 DIAGNOSIS — Z87891 Personal history of nicotine dependence: Secondary | ICD-10-CM

## 2022-10-03 DIAGNOSIS — J441 Chronic obstructive pulmonary disease with (acute) exacerbation: Secondary | ICD-10-CM | POA: Diagnosis not present

## 2022-10-03 DIAGNOSIS — Z8543 Personal history of malignant neoplasm of ovary: Secondary | ICD-10-CM

## 2022-10-03 DIAGNOSIS — E876 Hypokalemia: Secondary | ICD-10-CM | POA: Diagnosis present

## 2022-10-03 DIAGNOSIS — D638 Anemia in other chronic diseases classified elsewhere: Secondary | ICD-10-CM | POA: Diagnosis present

## 2022-10-03 DIAGNOSIS — J189 Pneumonia, unspecified organism: Secondary | ICD-10-CM | POA: Diagnosis present

## 2022-10-03 DIAGNOSIS — I10 Essential (primary) hypertension: Secondary | ICD-10-CM | POA: Diagnosis present

## 2022-10-03 DIAGNOSIS — Z9981 Dependence on supplemental oxygen: Secondary | ICD-10-CM

## 2022-10-03 DIAGNOSIS — Z7982 Long term (current) use of aspirin: Secondary | ICD-10-CM

## 2022-10-03 LAB — CBC WITH DIFFERENTIAL/PLATELET
Abs Immature Granulocytes: 0 10*3/uL (ref 0.00–0.07)
Basophils Absolute: 0.1 10*3/uL (ref 0.0–0.1)
Basophils Relative: 4 %
Eosinophils Absolute: 0.2 10*3/uL (ref 0.0–0.5)
Eosinophils Relative: 12 %
HCT: 20.3 % — ABNORMAL LOW (ref 36.0–46.0)
Hemoglobin: 6.7 g/dL — CL (ref 12.0–15.0)
Immature Granulocytes: 0 %
Lymphocytes Relative: 38 %
Lymphs Abs: 0.6 10*3/uL — ABNORMAL LOW (ref 0.7–4.0)
MCH: 33.3 pg (ref 26.0–34.0)
MCHC: 33 g/dL (ref 30.0–36.0)
MCV: 101 fL — ABNORMAL HIGH (ref 80.0–100.0)
Monocytes Absolute: 0.1 10*3/uL (ref 0.1–1.0)
Monocytes Relative: 6 %
Neutro Abs: 0.6 10*3/uL — ABNORMAL LOW (ref 1.7–7.7)
Neutrophils Relative %: 40 %
Platelets: 34 10*3/uL — ABNORMAL LOW (ref 150–400)
RBC: 2.01 MIL/uL — ABNORMAL LOW (ref 3.87–5.11)
RDW: 16.1 % — ABNORMAL HIGH (ref 11.5–15.5)
WBC: 1.6 10*3/uL — ABNORMAL LOW (ref 4.0–10.5)
nRBC: 0 % (ref 0.0–0.2)

## 2022-10-03 LAB — COMPREHENSIVE METABOLIC PANEL
ALT: 13 U/L (ref 0–44)
AST: 23 U/L (ref 15–41)
Albumin: 3.6 g/dL (ref 3.5–5.0)
Alkaline Phosphatase: 89 U/L (ref 38–126)
Anion gap: 10 (ref 5–15)
BUN: 11 mg/dL (ref 8–23)
CO2: 31 mmol/L (ref 22–32)
Calcium: 8.8 mg/dL — ABNORMAL LOW (ref 8.9–10.3)
Chloride: 93 mmol/L — ABNORMAL LOW (ref 98–111)
Creatinine, Ser: 0.66 mg/dL (ref 0.44–1.00)
GFR, Estimated: >60 mL/min (ref >60)
Glucose, Bld: 117 mg/dL — ABNORMAL HIGH (ref 70–99)
Potassium: 3.5 mmol/L (ref 3.5–5.1)
Sodium: 134 mmol/L — ABNORMAL LOW (ref 135–145)
Total Bilirubin: 0.4 mg/dL (ref 0.3–1.2)
Total Protein: 6.5 g/dL (ref 6.5–8.1)

## 2022-10-03 LAB — POC OCCULT BLOOD, ED: Fecal Occult Bld: NEGATIVE

## 2022-10-03 LAB — RESP PANEL BY RT-PCR (FLU A&B, COVID) ARPGX2
Influenza A by PCR: NEGATIVE
Influenza B by PCR: NEGATIVE
SARS Coronavirus 2 by RT PCR: NEGATIVE

## 2022-10-03 LAB — BRAIN NATRIURETIC PEPTIDE: B Natriuretic Peptide: 18 pg/mL (ref 0.0–100.0)

## 2022-10-03 LAB — PREPARE RBC (CROSSMATCH)

## 2022-10-03 MED ORDER — NIRAPARIB TOSYLATE 200 MG PO TABS: 200.0000 mg | ORAL_TABLET | Freq: Every day | ORAL | Status: DC

## 2022-10-03 MED ORDER — PANTOPRAZOLE SODIUM 40 MG PO TBEC
40.0000 mg | DELAYED_RELEASE_TABLET | Freq: Every day | ORAL | Status: DC
  Administered 2022-10-03 – 2022-10-10 (×8): 40 mg via ORAL
  Filled 2022-10-03 (×8): qty 1

## 2022-10-03 MED ORDER — METHYLPREDNISOLONE SODIUM SUCC 40 MG IJ SOLR
40.0000 mg | Freq: Two times a day (BID) | INTRAMUSCULAR | Status: DC
  Administered 2022-10-04 – 2022-10-06 (×5): 40 mg via INTRAVENOUS
  Filled 2022-10-03 (×5): qty 1

## 2022-10-03 MED ORDER — METHYLPREDNISOLONE SODIUM SUCC 125 MG IJ SOLR
125.0000 mg | Freq: Once | INTRAMUSCULAR | Status: AC
  Administered 2022-10-03: 125 mg via INTRAVENOUS
  Filled 2022-10-03: qty 2

## 2022-10-03 MED ORDER — ONDANSETRON HCL 4 MG PO TABS: 4.0000 mg | ORAL_TABLET | Freq: Four times a day (QID) | ORAL | Status: DC | PRN

## 2022-10-03 MED ORDER — IOHEXOL 350 MG/ML SOLN
75.0000 mL | Freq: Once | INTRAVENOUS | Status: AC | PRN
  Administered 2022-10-03: 75 mL via INTRAVENOUS

## 2022-10-03 MED ORDER — AZITHROMYCIN 250 MG PO TABS
250.0000 mg | ORAL_TABLET | Freq: Every day | ORAL | Status: AC
  Administered 2022-10-04 – 2022-10-07 (×4): 250 mg via ORAL
  Filled 2022-10-03 (×4): qty 1

## 2022-10-03 MED ORDER — FERROUS SULFATE 325 (65 FE) MG PO TABS
325.0000 mg | ORAL_TABLET | ORAL | Status: DC
  Administered 2022-10-04 – 2022-10-10 (×4): 325 mg via ORAL
  Filled 2022-10-03 (×4): qty 1

## 2022-10-03 MED ORDER — ACETAMINOPHEN 325 MG PO TABS
650.0000 mg | ORAL_TABLET | Freq: Four times a day (QID) | ORAL | Status: DC | PRN
  Administered 2022-10-06: 650 mg via ORAL
  Filled 2022-10-03 (×2): qty 2

## 2022-10-03 MED ORDER — SODIUM CHLORIDE 0.9 % IV SOLN
10.0000 mL/h | Freq: Once | INTRAVENOUS | Status: AC
  Administered 2022-10-03: 10 mL/h via INTRAVENOUS

## 2022-10-03 MED ORDER — GUAIFENESIN-DM 100-10 MG/5ML PO SYRP: 5.0000 mL | ORAL_SOLUTION | ORAL | Status: DC | PRN

## 2022-10-03 MED ORDER — ONDANSETRON HCL 4 MG/2ML IJ SOLN: 4.0000 mg | Freq: Four times a day (QID) | INTRAMUSCULAR | Status: DC | PRN

## 2022-10-03 MED ORDER — IPRATROPIUM-ALBUTEROL 0.5-2.5 (3) MG/3ML IN SOLN: 3.0000 mL | RESPIRATORY_TRACT | Status: DC | PRN

## 2022-10-03 MED ORDER — IPRATROPIUM-ALBUTEROL 0.5-2.5 (3) MG/3ML IN SOLN
3.0000 mL | Freq: Four times a day (QID) | RESPIRATORY_TRACT | Status: DC
  Administered 2022-10-04: 3 mL via RESPIRATORY_TRACT
  Filled 2022-10-03: qty 3

## 2022-10-03 MED ORDER — ALBUTEROL SULFATE HFA 108 (90 BASE) MCG/ACT IN AERS: 2.0000 | INHALATION_SPRAY | RESPIRATORY_TRACT | Status: DC | PRN

## 2022-10-03 MED ORDER — NORTRIPTYLINE HCL 25 MG PO CAPS
25.0000 mg | ORAL_CAPSULE | Freq: Every day | ORAL | Status: DC
  Administered 2022-10-03 – 2022-10-09 (×7): 25 mg via ORAL
  Filled 2022-10-03 (×7): qty 1

## 2022-10-03 MED ORDER — SODIUM CHLORIDE 0.9 % IV BOLUS
1000.0000 mL | Freq: Once | INTRAVENOUS | Status: AC
  Administered 2022-10-03: 1000 mL via INTRAVENOUS

## 2022-10-03 MED ORDER — AMLODIPINE BESYLATE 5 MG PO TABS
5.0000 mg | ORAL_TABLET | Freq: Every day | ORAL | Status: DC
  Administered 2022-10-03 – 2022-10-09 (×7): 5 mg via ORAL
  Filled 2022-10-03 (×8): qty 1

## 2022-10-03 MED ORDER — DM-GUAIFENESIN ER 30-600 MG PO TB12
1.0000 | ORAL_TABLET | Freq: Two times a day (BID) | ORAL | Status: DC
  Administered 2022-10-03 – 2022-10-06 (×6): 1 via ORAL
  Filled 2022-10-03 (×6): qty 1

## 2022-10-03 MED ORDER — ATORVASTATIN CALCIUM 40 MG PO TABS
40.0000 mg | ORAL_TABLET | Freq: Every day | ORAL | Status: DC
  Administered 2022-10-04 – 2022-10-10 (×7): 40 mg via ORAL
  Filled 2022-10-03 (×7): qty 1

## 2022-10-03 MED ORDER — ACETAMINOPHEN 650 MG RE SUPP: 650.0000 mg | Freq: Four times a day (QID) | RECTAL | Status: DC | PRN

## 2022-10-03 MED ORDER — IPRATROPIUM-ALBUTEROL 0.5-2.5 (3) MG/3ML IN SOLN
3.0000 mL | Freq: Once | RESPIRATORY_TRACT | Status: AC
  Administered 2022-10-03: 3 mL via RESPIRATORY_TRACT
  Filled 2022-10-03: qty 3

## 2022-10-03 MED ORDER — AZITHROMYCIN 250 MG PO TABS
500.0000 mg | ORAL_TABLET | Freq: Every day | ORAL | Status: AC
  Administered 2022-10-03: 500 mg via ORAL
  Filled 2022-10-03: qty 2

## 2022-10-03 NOTE — ED Notes (Signed)
Date and time results received: 10/03/22 0819   Test: Hemoglobin Critical Value: 6.4  Name of Provider Notified: [place]  Orders Received? Or Actions Taken?: NA

## 2022-10-03 NOTE — ED Notes (Signed)
Patient transported to CT 

## 2022-10-03 NOTE — ED Notes (Signed)
Pt ambulated the bathroom and back with no complaints and unassisted.

## 2022-10-03 NOTE — ED Provider Notes (Signed)
[facility] EMERGENCY DEPARTMENT Provider Note   CSN: [id number] Arrival date & time: 10/03/22  1556     History  Chief Complaint  Patient presents with   Dizziness   Shortness of Breath    [name] is a 66 y.o. female.  Pt is a 66 yo female with a pmhx significant for COPD with chronic oxygen via [place] at 3L, htn, anemia, hld, gastric cancer, hld, oa, ovarian cancer.  Pt does take oral chemo daily ([place]).  Pt said she has had a cough and sob for the past 2 days.  She has been having to increase her oxygen when she ambulates.  She can hardly walk across the room.  She has also been feeling dizzy.  This has been going on for a month.  Pt denies f/c.         Home Medications Prior to Admission medications   Medication Sig Start Date End Date Taking? Authorizing Provider  albuterol (VENTOLIN HFA) 108 (90 Base) MCG/ACT inhaler Inhale 2 puffs into the lungs every 6 (six) hours as needed for wheezing or shortness of breath. 07/01/22  Yes Emokpae, Courage, MD  amLODipine (NORVASC) 5 MG tablet Take 5 mg by mouth at bedtime.    Yes [provider]  aspirin EC 81 MG tablet Take 1 tablet (81 mg total) by mouth daily with breakfast. 07/01/22  Yes Emokpae, Courage, MD  atorvastatin (LIPITOR) 40 MG tablet Take 1 tablet (40 mg total) by mouth daily. 01/02/22  Yes Dessa Phi, DO  Budeson-Glycopyrrol-Formoterol (BREZTRI AEROSPHERE) 160-9-4.8 MCG/ACT AERO Inhale 2 puffs into the lungs 2 (two) times daily. 09/10/22  Yes Tanda Rockers, MD  ferrous sulfate 325 (65 FE) MG tablet Take 325 mg by mouth every other day.   Yes [provider]  ipratropium (ATROVENT HFA) 17 MCG/ACT inhaler Inhale 2 puffs into the lungs every 6 (six) hours as needed for wheezing.   Yes [provider]  levalbuterol (XOPENEX) 1.25 MG/3ML nebulizer solution Take 1.25 mg by nebulization every 6 (six) hours as needed for shortness of breath or wheezing. 09/28/21  Yes [provider]   magnesium oxide (MAG-OX) 400 (240 Mg) MG tablet Take 1 tablet (400 mg total) by mouth 2 (two) times daily. 09/06/22  Yes Derek Jack, MD  niraparib tosylate (ZEJULA) 200 MG tablet Take 1 tablet by mouth daily. 06/25/22  Yes Derek Jack, MD  nortriptyline (PAMELOR) 25 MG capsule Take 25 mg by mouth at bedtime.   Yes [provider]  OXYGEN Inhale 3 L into the lungs continuous.   Yes [provider]  pantoprazole (PROTONIX) 40 MG tablet Take 1 tablet (40 mg total) by mouth 2 (two) times daily. 07/01/22 10/03/22 Yes Emokpae, Courage, MD  predniSONE (DELTASONE) 5 MG tablet Take 5 mg by mouth daily with breakfast.   Yes [provider]  primidone (MYSOLINE) 50 MG tablet Take 50 mg by mouth at bedtime. 01/30/22  Yes [provider]  TRELEGY ELLIPTA 100-62.5-25 MCG/ACT AEPB Inhale 1 puff into the lungs daily.   Yes [provider]  clotrimazole (MYCELEX) 10 MG troche Take 1 tablet (10 mg total) by mouth 5 (five) times daily. Patient not taking: Reported on 10/03/2022 09/10/22   Tanda Rockers, MD  gabapentin (NEURONTIN) 600 MG tablet Take 1 tablet by mouth 3 (three) times daily. Patient not taking: Reported on 10/03/2022 09/23/22   [provider]  levofloxacin (LEVAQUIN) 500 MG tablet Take 1 tablet (500 mg total) by mouth  daily. Patient not taking: Reported on 10/03/2022 09/10/22   Tanda Rockers, MD  magic mouthwash SOLN Take 10 mLs by mouth 3 (three) times daily as needed (sore throat). Patient not taking: Reported on 10/03/2022 08/26/22   Harvel Quale, MD  nystatin (MYCOSTATIN) 100000 UNIT/ML suspension Take by mouth. Patient not taking: Reported on 10/03/2022 09/24/22   [provider]  simvastatin (ZOCOR) 40 MG tablet Take 40 mg by mouth daily. Patient not taking: Reported on 10/03/2022    [provider]      Allergies    Patient has no known allergies.    Review of Systems   Review of  Systems  Respiratory:  Positive for chest tightness and shortness of breath.   All other systems reviewed and are negative.   Physical Exam Updated Vital Signs BP 121/76   Pulse 98   Temp 98.5 F (36.9 C) (Oral)   Resp (!) 22   SpO2 98%  Physical Exam Vitals and nursing note reviewed.  Constitutional:      Appearance: She is well-developed.  HENT:     Head: Normocephalic and atraumatic.     Mouth/Throat:     Mouth: Mucous membranes are moist.     Pharynx: Oropharynx is clear.  Eyes:     Extraocular Movements: Extraocular movements intact.     Pupils: Pupils are equal, round, and reactive to light.  Cardiovascular:     Rate and Rhythm: Normal rate and regular rhythm.  Pulmonary:     Effort: Tachypnea present.     Breath sounds: Wheezing present.  Abdominal:     General: Bowel sounds are normal.     Palpations: Abdomen is soft.     Tenderness: There is no guarding.  Musculoskeletal:        General: Normal range of motion.     Cervical back: Normal range of motion and neck supple.  Skin:    General: Skin is warm.     Capillary Refill: Capillary refill takes less than 2 seconds.  Neurological:     General: No focal deficit present.     Mental Status: She is alert and oriented to person, place, and time.  Psychiatric:        Mood and Affect: Mood normal.        Behavior: Behavior normal.     ED Results / Procedures / Treatments   Labs (all labs ordered are listed, but only abnormal results are displayed) Labs Reviewed  CBC WITH DIFFERENTIAL/PLATELET - Abnormal; Notable for the following components:      Result Value   WBC 1.6 (*)    RBC 2.01 (*)    Hemoglobin 6.7 (*)    HCT 20.3 (*)    MCV 101.0 (*)    RDW 16.1 (*)    Platelets 34 (*)    Neutro Abs 0.6 (*)    Lymphs Abs 0.6 (*)    All other components within normal limits  COMPREHENSIVE METABOLIC PANEL - Abnormal; Notable for the following components:   Sodium 134 (*)    Chloride 93 (*)    Glucose, Bld  117 (*)    Calcium 8.8 (*)    All other components within normal limits  RESP PANEL BY RT-PCR (FLU A&B, COVID) ARPGX2  BRAIN NATRIURETIC PEPTIDE  POC OCCULT BLOOD, ED  TYPE AND SCREEN  PREPARE RBC (CROSSMATCH)    EKG EKG Interpretation  Date/Time:  Thursday October 03 2022 16:41:04 EST Ventricular Rate:  109 PR Interval:  160 QRS Duration: 66 QT Interval:  312 QTC Calculation: 420 R Axis:   70 Text Interpretation: Sinus tachycardia with Premature atrial complexes Low voltage QRS Borderline ECG When compared with ECG of 31-Jul-2022 14:31, Premature atrial complexes are now Present Criteria for Septal infarct are no longer Present No significant change since last tracing Confirmed by Isla Pence [phone]) on 10/03/2022 7:48:11 PM  Radiology CT Angio Chest PE W and/or Wo Contrast  Result Date: 10/03/2022 CLINICAL DATA:  Shortness of breath and dizzy EXAM: CT ANGIOGRAPHY CHEST WITH CONTRAST TECHNIQUE: Multidetector CT imaging of the chest was performed using the standard protocol during bolus administration of intravenous contrast. Multiplanar CT image reconstructions and MIPs were obtained to evaluate the vascular anatomy. RADIATION DOSE REDUCTION: This exam was performed according to the departmental dose-optimization program which includes automated exposure control, adjustment of the mA and/or kV according to patient size and/or use of iterative reconstruction technique. CONTRAST:  46m OMNIPAQUE IOHEXOL 350 MG/ML SOLN COMPARISON:  Chest x-ray 10/03/2022, CT chest 07/31/2022 FINDINGS: Cardiovascular: Satisfactory opacification of the pulmonary arteries to the segmental level. No evidence of pulmonary embolism. Non aneurysmal aorta. Moderate aortic atherosclerosis. No dissection is seen. Normal cardiac size. No pericardial effusion. Mediastinum/Nodes: Midline trachea. No thyroid mass. Stable subcentimeter mediastinal lymph nodes. Small hilar nodes. Esophagus within normal limits. Small  hiatal hernia. Lungs/Pleura: Emphysema. No acute airspace disease. No enlarging pulmonary nodules. Stable scattered clustered central lobular nodules. Mild lower and upper lobe bronchiectasis and bronchial wall thickening. Upper Abdomen: No acute finding Musculoskeletal: No acute osseous abnormality. Mild compression deformity of T8, stable since September. Review of the MIP images confirms the above findings. IMPRESSION: 1. Negative for acute pulmonary embolus or aortic dissection. 2. Emphysema. No acute airspace disease. Mild bronchiectasis and bronchial wall thickening with scattered clustered central lobular nodules, stable to recent priors. No enlarging pulmonary nodules 3. Aortic atherosclerosis. Aortic Atherosclerosis (ICD10-I70.0) and Emphysema (ICD10-J43.9). Electronically Signed   By: KDonavan FoilM.D.   On: 10/03/2022 20:40   CT Head Wo Contrast  Result Date: 10/03/2022 CLINICAL DATA:  Shortness of breath, dizziness EXAM: CT HEAD WITHOUT CONTRAST TECHNIQUE: Contiguous axial images were obtained from the base of the skull through the vertex without intravenous contrast. RADIATION DOSE REDUCTION: This exam was performed according to the departmental dose-optimization program which includes automated exposure control, adjustment of the mA and/or kV according to patient size and/or use of iterative reconstruction technique. COMPARISON:  01/28/2022 FINDINGS: Brain: No acute intracranial abnormality. Specifically, no hemorrhage, hydrocephalus, mass lesion, acute infarction, or significant intracranial injury. Vascular: No hyperdense vessel or unexpected calcification. Skull: No acute calvarial abnormality. Sinuses/Orbits: No acute findings Other: None IMPRESSION: No acute intracranial abnormality. Electronically Signed   By: KRolm BaptiseM.D.   On: 10/03/2022 20:22   DG Chest 2 View  Result Date: 10/03/2022 CLINICAL DATA:  Shortness of breath and dizziness for 2 weeks. Cough with productive green  mucus for 2 days. History of COPD. EXAM: CHEST - 2 VIEW COMPARISON:  Chest two views 07/31/2022 and 07/17/2022 FINDINGS: Left chest wall porta catheter tip overlies the superior vena cava/right atrial junction. Cardiac silhouette is normal in size. Moderate calcifications within the aortic arch. Findings the diaphragms and moderate hyperinflation. Increased lucencies within the upper lungs consistent with the emphysematous changes better seen on prior CT. Mild left costophrenic angle scarring is unchanged from multiple prior radiographs. No new focal airspace opacity. No pleural effusion or pneumothorax. Mild multilevel degenerative disc changes of the thoracic spine. IMPRESSION: 1.  No active cardiopulmonary disease. 2. COPD. 3. Aortic atherosclerosis. 4. Mild left costophrenic angle scarring, unchanged. Electronically Signed   By: Yvonne Kendall M.D.   On: 10/03/2022 18:01    Procedures Procedures    Medications Ordered in ED Medications  0.9 %  sodium chloride infusion (has no administration in time range)  sodium chloride 0.9 % bolus 1,000 mL (0 mLs Intravenous Stopped 10/03/22 1938)  ipratropium-albuterol (DUONEB) 0.5-2.5 (3) MG/3ML nebulizer solution 3 mL (3 mLs Nebulization Given 10/03/22 1842)  methylPREDNISolone sodium succinate (SOLU-MEDROL) 125 mg/2 mL injection 125 mg (125 mg Intravenous Given 10/03/22 1824)  iohexol (OMNIPAQUE) 350 MG/ML injection 75 mL (75 mLs Intravenous Contrast Given 10/03/22 2005)    ED Course/ Medical Decision Making/ A&P                           Medical Decision Making Amount and/or Complexity of Data Reviewed Labs: ordered. Radiology: ordered.  Risk Prescription drug management. Decision regarding hospitalization.   This patient presents to the ED for concern of sob, this involves an extensive number of treatment options, and is a complaint that carries with it a high risk of complications and morbidity.  The differential diagnosis includes copd exac,  pna, covid/flu, bronchitis, anemia, electrolyte abn   Co morbidities that complicate the patient evaluation  COPD with chronic oxygen via [place] at 3L, htn, anemia, hld, gastric cancer, hld, oa, ovarian cancer   Additional history obtained:  Additional history obtained from epic chart review External records from outside source obtained and reviewed including family   Lab Tests:  I Ordered, and personally interpreted labs.  The pertinent results include:  cmp nl, bnp nl, covid/flu neg; cbc with pancytopenia.  Wbc 1.6, hgb 6.7, hct 20.3 and plt low at 34   Imaging Studies ordered:  I ordered imaging studies including cxr and ct chest  I independently visualized and interpreted imaging which showed  CXR: No active cardiopulmonary disease.  2. COPD.  3. Aortic atherosclerosis.  4. Mild left costophrenic angle scarring, unchanged.  CT head: No acute intracranial abnormality.  CT chest: . Negative for acute pulmonary embolus or aortic dissection.  2. Emphysema. No acute airspace disease. Mild bronchiectasis and  bronchial wall thickening with scattered clustered central lobular  nodules, stable to recent priors. No enlarging pulmonary nodules  3. Aortic atherosclerosis.    Aortic Atherosclerosis (ICD10-I70.0) and Emphysema (ICD10-J43.9).     I agree with the radiologist interpretation   Cardiac Monitoring:  The patient was maintained on a cardiac monitor.  I personally viewed and interpreted the cardiac monitored which showed an underlying rhythm of: nsr   Medicines ordered and prescription drug management:  I ordered medication including solumedrol/duoneb  for copd  Reevaluation of the patient after these medicines showed that the patient improved I have reviewed the patients home medicines and have made adjustments as needed   Test Considered:  ct   Critical Interventions:  transfusion   Consultations Obtained:  I requested consultation with the  hospitalist(Dr. Adefeso),  and discussed lab and imaging findings as well as pertinent plan - he will admit  Problem List / ED Course:  SOB:  likely a combination of copd exac and anemia.  Covid/flu neg Symptomatic anemia:  pt transfused 1 unit COPD exac:  pt given solumedrol and nebs.  Oxygen stable on 3L with sitting in bed.   Reevaluation:  After the interventions noted above, I reevaluated the patient and found that  they have :improved   Social Determinants of Health:  Lives at home   Dispostion:  After consideration of the diagnostic results and the patients response to treatment, I feel that the patent would benefit from admission.    CRITICAL CARE Performed by: Isla Pence   Total critical care time: 30 minutes  Critical care time was exclusive of separately billable procedures and treating other patients.  Critical care was necessary to treat or prevent imminent or life-threatening deterioration.  Critical care was time spent personally by me on the following activities: development of treatment plan with patient and/or surrogate as well as nursing, discussions with consultants, evaluation of patient's response to treatment, examination of patient, obtaining history from patient or surrogate, ordering and performing treatments and interventions, ordering and review of laboratory studies, ordering and review of radiographic studies, pulse oximetry and re-evaluation of patient's condition.         Final Clinical Impression(s) / ED Diagnoses Final diagnoses:  Pancytopenia ([place])  Symptomatic anemia  History of ovarian cancer  COPD exacerbation ([place])    Rx / DC Orders ED Discharge Orders     None         Isla Pence, MD 10/03/22 2108

## 2022-10-03 NOTE — ED Triage Notes (Signed)
Sob and dizziness x 2 weeks. States has been constant and more with movement. Pt on chronic 02 3L [place]. Color slightly pale in triage. Cough with prod green mucus x 2 days. Mild labored breathing noted in triage. Non diaphoretic.

## 2022-10-03 NOTE — H&P (Signed)
History and Physical    Patient: [name] SRP:[id number] DOB: May 26, 1956 DOA: 10/03/2022 DOS: the patient was seen and examined on 10/03/2022 PCP: [name], MD  Patient coming from: Home  Chief Complaint:  Chief Complaint  Patient presents with   Dizziness   Shortness of Breath   HPI: SYRA SIRMONS is a 66 y.o. female with medical history significant of ovarian cancer on oral chemotherapy Noel Journey), COPD with supplemental oxygen via [place] at 3 LPM at baseline, hypertension, hyperlipidemia who presents to the emergency department due to 2-day onset of cough and increasing shortness of breath requiring her to increase her supplemental oxygen on ambulation.  Patient states that she could barely walk across the room without being short of breath.  Patient also complained of about a week of lightheadedness and dizziness.  She denies chest pain, fever, chills, nausea, vomiting, abdominal pain.  ED Course:  In the emergency department, she was tachypneic and initial pulse was 103 bpm and other vital signs were within normal range.  Work-up in the ED showed pancytopenia with H/H 6.7/20.3 (this was 8.3/25.2 on 08/27/2022).  WBC 1.6, MCV 101.0, platelets 34.  BMP showed sodium of 134, potassium 3.4, chloride 93, bicarb 31, glucose 117, BUN 11, creatinine 0.66, BNP 18.0, FOBT negative.  Influenza A, B, SARS coronavirus 2 was negative. CT angiography chest with contrast was negative for acute pulmonary embolus or aortic dissection.  Emphysema.  No acute airspace disease.  Mild bronchiectasis and bronchial wall thickening with scattered clustered centrilobular nodules, stable to recent priors.  No enlarging pulmonary nodules. CT head without contrast showed no acute intracranial abnormality Chest x-ray showed no active cardiopulmonary disease.  COPD Breathing treatment was provided, Solu-Medrol was given, IV hydration was provided.  1 unit of blood was ordered to be transfused in the ED.  Hospitalist  was asked to admit patient for further evaluation and management.  Review of Systems: Review of systems as noted in the HPI. All other systems reviewed and are negative.   Past Medical History:  Diagnosis Date   Anemia    Aortic atherosclerosis (HCC)    Arthritis    Asthma    Bilateral carotid artery stenosis    Cancer (HCC)    Gastric Cancer   COPD (chronic obstructive pulmonary disease) (HCC)    High cholesterol    Hyperlipidemia    Hypertension    Neuropathy    Osteoarthritis    Ovarian cancer ([place])    Oxygen dependent    Peripheral vascular disease ([place])    Peritoneal carcinoma ([place])    Pneumonia 2019   Port-A-Cath in place 07/03/2020   Pulmonary emphysema [facility])    Past Surgical History:  Procedure Laterality Date   BIOPSY  06/30/2022   Procedure: BIOPSY;  Surgeon: Eloise Harman, DO;  [facility];  Service: Endoscopy;;  gastric   BIOPSY  09/03/2022   Procedure: BIOPSY;  Surgeon: Harvel Quale, MD;  [facility];  Service: Gastroenterology;;   COLONOSCOPY WITH PROPOFOL N/A 09/03/2022   Procedure: COLONOSCOPY WITH PROPOFOL;  Surgeon: Harvel Quale, MD;  [facility];  Service: Gastroenterology;  Laterality: N/A;  900 ASA 3   ESOPHAGEAL BRUSHING  09/03/2022   Procedure: ESOPHAGEAL BRUSHING;  Surgeon: Montez Morita, Quillian Quince, MD;  [facility];  Service: Gastroenterology;;   ESOPHAGOGASTRODUODENOSCOPY (EGD) WITH PROPOFOL N/A 02/17/2021   Procedure: ESOPHAGOGASTRODUODENOSCOPY (EGD) WITH PROPOFOL;  Surgeon: Harvel Quale, MD;  [facility];  Service: Gastroenterology;  Laterality: N/A;   ESOPHAGOGASTRODUODENOSCOPY (EGD) WITH PROPOFOL N/A 05/04/2022   Procedure: ESOPHAGOGASTRODUODENOSCOPY (EGD) WITH PROPOFOL;  Surgeon: Daneil Dolin, MD;  [facility];  Service: Endoscopy;  Laterality: N/A;   ESOPHAGOGASTRODUODENOSCOPY (EGD) WITH PROPOFOL N/A 06/30/2022   Procedure:  ESOPHAGOGASTRODUODENOSCOPY (EGD) WITH PROPOFOL;  Surgeon: Eloise Harman, DO;  [facility];  Service: Endoscopy;  Laterality: N/A;   ESOPHAGOGASTRODUODENOSCOPY (EGD) WITH PROPOFOL N/A 09/03/2022   Procedure: ESOPHAGOGASTRODUODENOSCOPY (EGD) WITH PROPOFOL;  Surgeon: Harvel Quale, MD;  [facility];  Service: Gastroenterology;  Laterality: N/A;   HALLUX VALGUS BASE WEDGE Right 06/09/2015   Procedure: Base wedge osteotomy with modified McBride right foot ;  Surgeon: Sharlotte Alamo, MD;  Location: [facility];  Service: Podiatry;  Laterality: Right;   PORTACATH PLACEMENT Left 06/28/2020   Procedure: PORT-A-CATHETER PLACEMENT LEFT CHEST (attached catheter in left subclavian);  Surgeon: Virl Cagey, MD;  Location: [facility];  Service: General;  Laterality: Left;   TUBAL LIGATION     VIDEO BRONCHOSCOPY WITH ENDOBRONCHIAL NAVIGATION N/A 03/09/2021   Procedure: VIDEO BRONCHOSCOPY WITH ENDOBRONCHIAL NAVIGATION;  Surgeon: Ottie Glazier, MD;  Location: [facility];  Service: Thoracic;  Laterality: N/A;   VIDEO BRONCHOSCOPY WITH ENDOBRONCHIAL ULTRASOUND N/A 03/09/2021   Procedure: VIDEO BRONCHOSCOPY WITH ENDOBRONCHIAL ULTRASOUND;  Surgeon: Ottie Glazier, MD;  Location: [facility];  Service: Thoracic;  Laterality: N/A;    Social History:  reports that she quit smoking about 8 years ago. Her smoking use included cigarettes. She has a 60.00 pack-year smoking history. She has never used smokeless tobacco. She reports that she does not drink alcohol and does not use drugs.   No Known Allergies  Family History  Problem Relation Age of Onset   Alzheimer's disease Mother    COPD Father    Emphysema Father    Hypertension Father    Healthy Sister    Healthy Brother    Alzheimer's disease Maternal Grandmother    Healthy Sister    Healthy Sister    Healthy Sister    Prostate cancer Other        paternal grandmother's brother; dx in early 77s   Breast cancer Neg Hx       Prior to Admission medications   Medication Sig Start Date End Date Taking? Authorizing Provider  albuterol (VENTOLIN HFA) 108 (90 Base) MCG/ACT inhaler Inhale 2 puffs into the lungs every 6 (six) hours as needed for wheezing or shortness of breath. 07/01/22  Yes Emokpae, Courage, MD  amLODipine (NORVASC) 5 MG tablet Take 5 mg by mouth at bedtime.    Yes [provider]  aspirin EC 81 MG tablet Take 1 tablet (81 mg total) by mouth daily with breakfast. 07/01/22  Yes Emokpae, Courage, MD  atorvastatin (LIPITOR) 40 MG tablet Take 1 tablet (40 mg total) by mouth daily. 01/02/22  Yes Dessa Phi, DO  Budeson-Glycopyrrol-Formoterol (BREZTRI AEROSPHERE) 160-9-4.8 MCG/ACT AERO Inhale 2 puffs into the lungs 2 (two) times daily. 09/10/22  Yes Tanda Rockers, MD  ferrous sulfate 325 (65 FE) MG tablet Take 325 mg by mouth every other day.   Yes [provider]  ipratropium (ATROVENT HFA) 17 MCG/ACT inhaler Inhale 2 puffs into the lungs every 6 (six) hours as needed for wheezing.   Yes [provider]  levalbuterol (XOPENEX) 1.25 MG/3ML nebulizer solution Take 1.25 mg by nebulization every 6 (six) hours as needed for shortness of breath or wheezing. 09/28/21  Yes [provider]  magnesium oxide (MAG-OX) 400 (240 Mg) MG tablet Take 1 tablet (400 mg total) by mouth 2 (two) times daily. 09/06/22  Yes Derek Jack, MD  niraparib tosylate (ZEJULA) 200 MG tablet Take 1 tablet by mouth daily. 06/25/22  Yes Derek Jack, MD  nortriptyline (PAMELOR) 25 MG capsule Take 25 mg by mouth at bedtime.   Yes [provider]  OXYGEN Inhale 3 L into the lungs continuous.   Yes [provider]  pantoprazole (PROTONIX) 40 MG tablet Take 1 tablet (40 mg total) by mouth 2 (two) times daily. 07/01/22 10/03/22 Yes Emokpae, Courage, MD  predniSONE (DELTASONE) 5 MG tablet Take 5 mg by mouth daily with breakfast.   Yes [provider]  primidone (MYSOLINE) 50  MG tablet Take 50 mg by mouth at bedtime. 01/30/22  Yes [provider]  TRELEGY ELLIPTA 100-62.5-25 MCG/ACT AEPB Inhale 1 puff into the lungs daily.   Yes [provider]  clotrimazole (MYCELEX) 10 MG troche Take 1 tablet (10 mg total) by mouth 5 (five) times daily. Patient not taking: Reported on 10/03/2022 09/10/22   Tanda Rockers, MD  gabapentin (NEURONTIN) 600 MG tablet Take 1 tablet by mouth 3 (three) times daily. Patient not taking: Reported on 10/03/2022 09/23/22   [provider]  levofloxacin (LEVAQUIN) 500 MG tablet Take 1 tablet (500 mg total) by mouth daily. Patient not taking: Reported on 10/03/2022 09/10/22   Tanda Rockers, MD  magic mouthwash SOLN Take 10 mLs by mouth 3 (three) times daily as needed (sore throat). Patient not taking: Reported on 10/03/2022 08/26/22   Harvel Quale, MD  nystatin (MYCOSTATIN) 100000 UNIT/ML suspension Take by mouth. Patient not taking: Reported on 10/03/2022 09/24/22   [provider]  simvastatin (ZOCOR) 40 MG tablet Take 40 mg by mouth daily. Patient not taking: Reported on 10/03/2022    [provider]    Physical Exam: BP 121/76   Pulse 98   Temp 98.5 F (36.9 C) (Oral)   Resp (!) 22   SpO2 98%   General: 66 y.o. year-old female well developed well nourished in no acute distress.  Alert and oriented x3. HEENT: NCAT, EOMI Neck: Supple, trachea medial Cardiovascular: Regular rate and rhythm with no rubs or gallops.  No thyromegaly or JVD noted.  No lower extremity edema. 2/4 pulses in all 4 extremities. Respiratory: Tachypnea, diffuse wheezing on auscultation.   Abdomen: Soft, nontender nondistended with normal bowel sounds x4 quadrants. Muskuloskeletal: No cyanosis, clubbing or edema noted bilaterally Neuro: CN II-XII intact, strength 5/5 x 4, sensation, reflexes intact Skin: No ulcerative lesions noted or rashes Psychiatry: Judgement and insight appear normal. Mood is  appropriate for condition and setting          Labs on Admission:  Basic Metabolic Panel: Recent Labs  Lab 10/03/22 1817  NA 134*  K 3.5  CL 93*  CO2 31  GLUCOSE 117*  BUN 11  CREATININE 0.66  CALCIUM 8.8*   Liver Function Tests: Recent Labs  Lab 10/03/22 1817  AST 23  ALT 13  ALKPHOS 89  BILITOT 0.4  PROT 6.5  ALBUMIN 3.6   No results for input(s): "LIPASE", "AMYLASE" in the last 168 hours. No results for input(s): "AMMONIA" in the last 168 hours. CBC: Recent Labs  Lab 10/03/22 1817  WBC 1.6*  NEUTROABS 0.6*  HGB 6.7*  HCT 20.3*  MCV 101.0*  PLT 34*   Cardiac Enzymes: No results for input(s): "CKTOTAL", "CKMB", "CKMBINDEX", "TROPONINI" in the  last 168 hours.  BNP (last 3 results) Recent Labs    06/28/22 1203 07/31/22 1745 10/03/22 1817  BNP 27.0 29.0 18.0    ProBNP (last 3 results) No results for input(s): "PROBNP" in the last 8760 hours.  CBG: No results for input(s): "GLUCAP" in the last 168 hours.  Radiological Exams on Admission: CT Angio Chest PE W and/or Wo Contrast  Result Date: 10/03/2022 CLINICAL DATA:  Shortness of breath and dizzy EXAM: CT ANGIOGRAPHY CHEST WITH CONTRAST TECHNIQUE: Multidetector CT imaging of the chest was performed using the standard protocol during bolus administration of intravenous contrast. Multiplanar CT image reconstructions and MIPs were obtained to evaluate the vascular anatomy. RADIATION DOSE REDUCTION: This exam was performed according to the departmental dose-optimization program which includes automated exposure control, adjustment of the mA and/or kV according to patient size and/or use of iterative reconstruction technique. CONTRAST:  91m OMNIPAQUE IOHEXOL 350 MG/ML SOLN COMPARISON:  Chest x-ray 10/03/2022, CT chest 07/31/2022 FINDINGS: Cardiovascular: Satisfactory opacification of the pulmonary arteries to the segmental level. No evidence of pulmonary embolism. Non aneurysmal aorta. Moderate aortic  atherosclerosis. No dissection is seen. Normal cardiac size. No pericardial effusion. Mediastinum/Nodes: Midline trachea. No thyroid mass. Stable subcentimeter mediastinal lymph nodes. Small hilar nodes. Esophagus within normal limits. Small hiatal hernia. Lungs/Pleura: Emphysema. No acute airspace disease. No enlarging pulmonary nodules. Stable scattered clustered central lobular nodules. Mild lower and upper lobe bronchiectasis and bronchial wall thickening. Upper Abdomen: No acute finding Musculoskeletal: No acute osseous abnormality. Mild compression deformity of T8, stable since September. Review of the MIP images confirms the above findings. IMPRESSION: 1. Negative for acute pulmonary embolus or aortic dissection. 2. Emphysema. No acute airspace disease. Mild bronchiectasis and bronchial wall thickening with scattered clustered central lobular nodules, stable to recent priors. No enlarging pulmonary nodules 3. Aortic atherosclerosis. Aortic Atherosclerosis (ICD10-I70.0) and Emphysema (ICD10-J43.9). Electronically Signed   By: KDonavan FoilM.D.   On: 10/03/2022 20:40   CT Head Wo Contrast  Result Date: 10/03/2022 CLINICAL DATA:  Shortness of breath, dizziness EXAM: CT HEAD WITHOUT CONTRAST TECHNIQUE: Contiguous axial images were obtained from the base of the skull through the vertex without intravenous contrast. RADIATION DOSE REDUCTION: This exam was performed according to the departmental dose-optimization program which includes automated exposure control, adjustment of the mA and/or kV according to patient size and/or use of iterative reconstruction technique. COMPARISON:  01/28/2022 FINDINGS: Brain: No acute intracranial abnormality. Specifically, no hemorrhage, hydrocephalus, mass lesion, acute infarction, or significant intracranial injury. Vascular: No hyperdense vessel or unexpected calcification. Skull: No acute calvarial abnormality. Sinuses/Orbits: No acute findings Other: None IMPRESSION: No  acute intracranial abnormality. Electronically Signed   By: KRolm BaptiseM.D.   On: 10/03/2022 20:22   DG Chest 2 View  Result Date: 10/03/2022 CLINICAL DATA:  Shortness of breath and dizziness for 2 weeks. Cough with productive green mucus for 2 days. History of COPD. EXAM: CHEST - 2 VIEW COMPARISON:  Chest two views 07/31/2022 and 07/17/2022 FINDINGS: Left chest wall porta catheter tip overlies the superior vena cava/right atrial junction. Cardiac silhouette is normal in size. Moderate calcifications within the aortic arch. Findings the diaphragms and moderate hyperinflation. Increased lucencies within the upper lungs consistent with the emphysematous changes better seen on prior CT. Mild left costophrenic angle scarring is unchanged from multiple prior radiographs. No new focal airspace opacity. No pleural effusion or pneumothorax. Mild multilevel degenerative disc changes of the thoracic spine. IMPRESSION: 1. No active cardiopulmonary disease. 2. COPD. 3. Aortic atherosclerosis.  4. Mild left costophrenic angle scarring, unchanged. Electronically Signed   By: Yvonne Kendall M.D.   On: 10/03/2022 18:01    EKG: I independently viewed the EKG done and my findings are as followed: Sinus tachycardia with PACs at a rate of 109 bpm  Assessment/Plan Present on Admission:  Symptomatic anemia  COPD exacerbation ([place])  Acute on chronic respiratory failure with hypoxia ([place])  GERD (gastroesophageal reflux disease)  Essential hypertension  Mixed hyperlipidemia  Ovarian cancer ([place])  Iron deficiency anemia  Principal Problem:   Symptomatic anemia Active Problems:   Essential hypertension   Iron deficiency anemia   Acute on chronic respiratory failure with hypoxia (HCC)   Mixed hyperlipidemia   Ovarian cancer (HCC)   GERD (gastroesophageal reflux disease)   COPD exacerbation (HCC)   Macrocytic anemia   Pancytopenia (HCC)  Symptomatic anemia H/H=H/H 6.7/20.3 (this was 8.3/25.2 on  08/27/2022). Hemoccult was negative Type and crossmatch was done 1 unit of PRBC was ordered to be transfused in the ED  COPD exacerbation Acute on chronic respiratory failure with hypoxia Continue duo nebs, Robitussin, Mucinex, Solu-Medrol, azithromycin. Continue Protonix to prevent steroid-induced ulcer Continue incentive spirometry and flutter valve Continue supplemental oxygen to maintain O2 sat > 92% with plan to wean patient off oxygen as tolerated  Iron deficiency anemia Continue ferrous sulfate  Macrocytic anemia MCV 101.0; folate and vitamin B12 will be checked  Pancytopenia possibly secondary to chemotherapy WBC 1.6, hemoglobin 6.7, hematocrit 20.3, platelets 34 Consider hematology consult  Ovarian cancer Continue Zejula  GERD Continue Protonix  Essential hypertension Continue amlodipine  Mixed hyperlipidemia Continue Lipitor  Depression Continue Pamelor   DVT prophylaxis: SCDs  Code Status: Full code  Consults: None  Family Communication: None at bedside  Severity of Illness: The appropriate patient status for this patient is OBSERVATION. Observation status is judged to be reasonable and necessary in order to provide the required intensity of service to ensure the patient's safety. The patient's presenting symptoms, physical exam findings, and initial radiographic and laboratory data in the context of their medical condition is felt to place them at decreased risk for further clinical deterioration. Furthermore, it is anticipated that the patient will be medically stable for discharge from the hospital within 2 midnights of admission.   Author: Bernadette Hoit, DO 10/03/2022 10:01 PM  For on call review [email or web address].

## 2022-10-03 NOTE — H&P (Incomplete Revision)
History and Physical    Patient: [name] JSH:[id number] DOB: November 17, 1956 DOA: 10/03/2022 DOS: the patient was seen and examined on 10/03/2022 PCP: [name], MD  Patient coming from: Home  Chief Complaint:  Chief Complaint  Patient presents with   Dizziness   Shortness of Breath   HPI: [name] is a 66 y.o. female with medical history significant of ovarian cancer on oral chemotherapy Noel Journey), COPD with supplemental oxygen via [place] at 3 LPM at baseline, hypertension, hyperlipidemia who presents to the emergency department due to 2-day onset of cough and increasing shortness of breath requiring her to increase her supplemental oxygen on ambulation.  Patient states that she could barely walk across the room without being short of breath.  Patient also complained of about a week of lightheadedness and dizziness.  She denies chest pain, fever, chills, nausea, vomiting, abdominal pain.  ED Course:  In the emergency department, she was tachypneic and initial pulse was 103 bpm and other vital signs were within normal range.  Work-up in the ED showed pancytopenia with H/H 6.7/20.3 (this was 8.3/25.2 on 08/27/2022).  WBC 1.6, MCV 101.0, platelets 34.  BMP showed sodium of 134, potassium 3.4, chloride 93, bicarb 31, glucose 117, BUN 11, creatinine 0.66, BNP 18.0, FOBT negative.  Influenza A, B, SARS coronavirus 2 was negative. CT angiography chest with contrast was negative for acute pulmonary embolus or aortic dissection.  Emphysema.  No acute airspace disease.  Mild bronchiectasis and bronchial wall thickening with scattered clustered centrilobular nodules, stable to recent priors.  No enlarging pulmonary nodules. CT head without contrast showed no acute intracranial abnormality Chest x-ray showed no active cardiopulmonary disease.  COPD Breathing treatment was provided, Solu-Medrol was given, IV hydration was provided.  1 unit of blood was ordered to be transfused in the ED.  Hospitalist  was asked to admit patient for further evaluation and management.  Review of Systems: Review of systems as noted in the HPI. All other systems reviewed and are negative.   Past Medical History:  Diagnosis Date   Anemia    Aortic atherosclerosis (HCC)    Arthritis    Asthma    Bilateral carotid artery stenosis    Cancer (HCC)    Gastric Cancer   COPD (chronic obstructive pulmonary disease) (HCC)    High cholesterol    Hyperlipidemia    Hypertension    Neuropathy    Osteoarthritis    Ovarian cancer ([place])    Oxygen dependent    Peripheral vascular disease ([place])    Peritoneal carcinoma ([place])    Pneumonia 2019   Port-A-Cath in place 07/03/2020   Pulmonary emphysema [facility])    Past Surgical History:  Procedure Laterality Date   BIOPSY  06/30/2022   Procedure: BIOPSY;  Surgeon: Eloise Harman, DO;  [facility];  Service: Endoscopy;;  gastric   BIOPSY  09/03/2022   Procedure: BIOPSY;  Surgeon: Harvel Quale, MD;  [facility];  Service: Gastroenterology;;   COLONOSCOPY WITH PROPOFOL N/A 09/03/2022   Procedure: COLONOSCOPY WITH PROPOFOL;  Surgeon: Harvel Quale, MD;  [facility];  Service: Gastroenterology;  Laterality: N/A;  900 ASA 3   ESOPHAGEAL BRUSHING  09/03/2022   Procedure: ESOPHAGEAL BRUSHING;  Surgeon: Montez Morita, Quillian Quince, MD;  [facility];  Service: Gastroenterology;;   ESOPHAGOGASTRODUODENOSCOPY (EGD) WITH PROPOFOL N/A 02/17/2021   Procedure: ESOPHAGOGASTRODUODENOSCOPY (EGD) WITH PROPOFOL;  Surgeon: Harvel Quale, MD;  [facility];  Service: Gastroenterology;  Laterality: N/A;   ESOPHAGOGASTRODUODENOSCOPY (EGD) WITH PROPOFOL N/A 05/04/2022   Procedure: ESOPHAGOGASTRODUODENOSCOPY (EGD) WITH PROPOFOL;  Surgeon: Daneil Dolin, MD;  [facility];  Service: Endoscopy;  Laterality: N/A;   ESOPHAGOGASTRODUODENOSCOPY (EGD) WITH PROPOFOL N/A 06/30/2022   Procedure:  ESOPHAGOGASTRODUODENOSCOPY (EGD) WITH PROPOFOL;  Surgeon: Eloise Harman, DO;  [facility];  Service: Endoscopy;  Laterality: N/A;   ESOPHAGOGASTRODUODENOSCOPY (EGD) WITH PROPOFOL N/A 09/03/2022   Procedure: ESOPHAGOGASTRODUODENOSCOPY (EGD) WITH PROPOFOL;  Surgeon: Harvel Quale, MD;  [facility];  Service: Gastroenterology;  Laterality: N/A;   HALLUX VALGUS BASE WEDGE Right 06/09/2015   Procedure: Base wedge osteotomy with modified McBride right foot ;  Surgeon: Sharlotte Alamo, MD;  Location: [facility];  Service: Podiatry;  Laterality: Right;   PORTACATH PLACEMENT Left 06/28/2020   Procedure: PORT-A-CATHETER PLACEMENT LEFT CHEST (attached catheter in left subclavian);  Surgeon: Virl Cagey, MD;  Location: [facility];  Service: General;  Laterality: Left;   TUBAL LIGATION     VIDEO BRONCHOSCOPY WITH ENDOBRONCHIAL NAVIGATION N/A 03/09/2021   Procedure: VIDEO BRONCHOSCOPY WITH ENDOBRONCHIAL NAVIGATION;  Surgeon: Ottie Glazier, MD;  Location: [facility];  Service: Thoracic;  Laterality: N/A;   VIDEO BRONCHOSCOPY WITH ENDOBRONCHIAL ULTRASOUND N/A 03/09/2021   Procedure: VIDEO BRONCHOSCOPY WITH ENDOBRONCHIAL ULTRASOUND;  Surgeon: Ottie Glazier, MD;  Location: [facility];  Service: Thoracic;  Laterality: N/A;    Social History:  reports that she quit smoking about 8 years ago. Her smoking use included cigarettes. She has a 60.00 pack-year smoking history. She has never used smokeless tobacco. She reports that she does not drink alcohol and does not use drugs.   No Known Allergies  Family History  Problem Relation Age of Onset   Alzheimer's disease Mother    COPD Father    Emphysema Father    Hypertension Father    Healthy Sister    Healthy Brother    Alzheimer's disease Maternal Grandmother    Healthy Sister    Healthy Sister    Healthy Sister    Prostate cancer Other        paternal grandmother's brother; dx in early 49s   Breast cancer Neg Hx       Prior to Admission medications   Medication Sig Start Date End Date Taking? Authorizing Provider  albuterol (VENTOLIN HFA) 108 (90 Base) MCG/ACT inhaler Inhale 2 puffs into the lungs every 6 (six) hours as needed for wheezing or shortness of breath. 07/01/22  Yes Emokpae, Courage, MD  amLODipine (NORVASC) 5 MG tablet Take 5 mg by mouth at bedtime.    Yes [provider]  aspirin EC 81 MG tablet Take 1 tablet (81 mg total) by mouth daily with breakfast. 07/01/22  Yes Emokpae, Courage, MD  atorvastatin (LIPITOR) 40 MG tablet Take 1 tablet (40 mg total) by mouth daily. 01/02/22  Yes Dessa Phi, DO  Budeson-Glycopyrrol-Formoterol (BREZTRI AEROSPHERE) 160-9-4.8 MCG/ACT AERO Inhale 2 puffs into the lungs 2 (two) times daily. 09/10/22  Yes Tanda Rockers, MD  ferrous sulfate 325 (65 FE) MG tablet Take 325 mg by mouth every other day.   Yes [provider]  ipratropium (ATROVENT HFA) 17 MCG/ACT inhaler Inhale 2 puffs into the lungs every 6 (six) hours as needed for wheezing.   Yes [provider]  levalbuterol (XOPENEX) 1.25 MG/3ML nebulizer solution Take 1.25 mg by nebulization every 6 (six) hours as needed for shortness of breath or wheezing. 09/28/21  Yes [provider]  magnesium oxide (MAG-OX) 400 (240 Mg) MG tablet Take 1 tablet (400 mg total) by mouth 2 (two) times daily. 09/06/22  Yes Derek Jack, MD  niraparib tosylate (ZEJULA) 200 MG tablet Take 1 tablet by mouth daily. 06/25/22  Yes Derek Jack, MD  nortriptyline (PAMELOR) 25 MG capsule Take 25 mg by mouth at bedtime.   Yes [provider]  OXYGEN Inhale 3 L into the lungs continuous.   Yes [provider]  pantoprazole (PROTONIX) 40 MG tablet Take 1 tablet (40 mg total) by mouth 2 (two) times daily. 07/01/22 10/03/22 Yes Emokpae, Courage, MD  predniSONE (DELTASONE) 5 MG tablet Take 5 mg by mouth daily with breakfast.   Yes [provider]  primidone (MYSOLINE) 50  MG tablet Take 50 mg by mouth at bedtime. 01/30/22  Yes [provider]  TRELEGY ELLIPTA 100-62.5-25 MCG/ACT AEPB Inhale 1 puff into the lungs daily.   Yes [provider]  clotrimazole (MYCELEX) 10 MG troche Take 1 tablet (10 mg total) by mouth 5 (five) times daily. Patient not taking: Reported on 10/03/2022 09/10/22   Tanda Rockers, MD  gabapentin (NEURONTIN) 600 MG tablet Take 1 tablet by mouth 3 (three) times daily. Patient not taking: Reported on 10/03/2022 09/23/22   [provider]  levofloxacin (LEVAQUIN) 500 MG tablet Take 1 tablet (500 mg total) by mouth daily. Patient not taking: Reported on 10/03/2022 09/10/22   Tanda Rockers, MD  magic mouthwash SOLN Take 10 mLs by mouth 3 (three) times daily as needed (sore throat). Patient not taking: Reported on 10/03/2022 08/26/22   Harvel Quale, MD  nystatin (MYCOSTATIN) 100000 UNIT/ML suspension Take by mouth. Patient not taking: Reported on 10/03/2022 09/24/22   [provider]  simvastatin (ZOCOR) 40 MG tablet Take 40 mg by mouth daily. Patient not taking: Reported on 10/03/2022    [provider]    Physical Exam: BP 121/76   Pulse 98   Temp 98.5 F (36.9 C) (Oral)   Resp (!) 22   SpO2 98%   General: 66 y.o. year-old female ill appearing, but in no acute distress.  Alert and oriented x3. HEENT: NCAT, EOMI, pale conjunctiva Neck: Supple, trachea medial Cardiovascular: Regular rate and rhythm with no rubs or gallops.  No thyromegaly or JVD noted.  No lower extremity edema. 2/4 pulses in all 4 extremities. Respiratory: Tachypnea, scattered wheezes on auscultation.   Abdomen: Soft, nontender nondistended with normal bowel sounds x4 quadrants. Muskuloskeletal: No cyanosis, clubbing or edema noted bilaterally Neuro: CN II-XII intact, sensation, reflexes intact Skin: No ulcerative lesions noted or rashes Psychiatry: Judgement and insight appear normal. Mood is appropriate for  condition and setting          Labs on Admission:  Basic Metabolic Panel: Recent Labs  Lab 10/03/22 1817  NA 134*  K 3.5  CL 93*  CO2 31  GLUCOSE 117*  BUN 11  CREATININE 0.66  CALCIUM 8.8*   Liver Function Tests: Recent Labs  Lab 10/03/22 1817  AST 23  ALT 13  ALKPHOS 89  BILITOT 0.4  PROT 6.5  ALBUMIN 3.6   No results for input(s): "LIPASE", "AMYLASE" in the last 168 hours. No results for input(s): "AMMONIA" in the last 168 hours. CBC: Recent Labs  Lab 10/03/22 1817  WBC 1.6*  NEUTROABS 0.6*  HGB 6.7*  HCT 20.3*  MCV 101.0*  PLT 34*   Cardiac Enzymes: No results for input(s): "CKTOTAL", "CKMB", "CKMBINDEX", "TROPONINI" in the last 168 hours.  BNP (last 3 results) Recent Labs    06/28/22 1203 07/31/22 1745 10/03/22 1817  BNP 27.0 29.0 18.0    ProBNP (last 3 results) No results for input(s): "PROBNP" in the last 8760 hours.  CBG: No results for input(s): "GLUCAP" in the last 168 hours.  Radiological Exams on Admission: CT Angio Chest PE W and/or Wo Contrast  Result Date: 10/03/2022 CLINICAL DATA:  Shortness of breath and dizzy EXAM: CT ANGIOGRAPHY CHEST WITH CONTRAST TECHNIQUE: Multidetector CT imaging of the chest was performed using the standard protocol during bolus administration of intravenous contrast. Multiplanar CT image reconstructions and MIPs were obtained to evaluate the vascular anatomy. RADIATION DOSE REDUCTION: This exam was performed according to the departmental dose-optimization program which includes automated exposure control, adjustment of the mA and/or kV according to patient size and/or use of iterative reconstruction technique. CONTRAST:  15m OMNIPAQUE IOHEXOL 350 MG/ML SOLN COMPARISON:  Chest x-ray 10/03/2022, CT chest 07/31/2022 FINDINGS: Cardiovascular: Satisfactory opacification of the pulmonary arteries to the segmental level. No evidence of pulmonary embolism. Non aneurysmal aorta. Moderate aortic atherosclerosis. No  dissection is seen. Normal cardiac size. No pericardial effusion. Mediastinum/Nodes: Midline trachea. No thyroid mass. Stable subcentimeter mediastinal lymph nodes. Small hilar nodes. Esophagus within normal limits. Small hiatal hernia. Lungs/Pleura: Emphysema. No acute airspace disease. No enlarging pulmonary nodules. Stable scattered clustered central lobular nodules. Mild lower and upper lobe bronchiectasis and bronchial wall thickening. Upper Abdomen: No acute finding Musculoskeletal: No acute osseous abnormality. Mild compression deformity of T8, stable since September. Review of the MIP images confirms the above findings. IMPRESSION: 1. Negative for acute pulmonary embolus or aortic dissection. 2. Emphysema. No acute airspace disease. Mild bronchiectasis and bronchial wall thickening with scattered clustered central lobular nodules, stable to recent priors. No enlarging pulmonary nodules 3. Aortic atherosclerosis. Aortic Atherosclerosis (ICD10-I70.0) and Emphysema (ICD10-J43.9). Electronically Signed   By: KDonavan FoilM.D.   On: 10/03/2022 20:40   CT Head Wo Contrast  Result Date: 10/03/2022 CLINICAL DATA:  Shortness of breath, dizziness EXAM: CT HEAD WITHOUT CONTRAST TECHNIQUE: Contiguous axial images were obtained from the base of the skull through the vertex without intravenous contrast. RADIATION DOSE REDUCTION: This exam was performed according to the departmental dose-optimization program which includes automated exposure control, adjustment of the mA and/or kV according to patient size and/or use of iterative reconstruction technique. COMPARISON:  01/28/2022 FINDINGS: Brain: No acute intracranial abnormality. Specifically, no hemorrhage, hydrocephalus, mass lesion, acute infarction, or significant intracranial injury. Vascular: No hyperdense vessel or unexpected calcification. Skull: No acute calvarial abnormality. Sinuses/Orbits: No acute findings Other: None IMPRESSION: No acute intracranial  abnormality. Electronically Signed   By: KRolm BaptiseM.D.   On: 10/03/2022 20:22   DG Chest 2 View  Result Date: 10/03/2022 CLINICAL DATA:  Shortness of breath and dizziness for 2 weeks. Cough with productive green mucus for 2 days. History of COPD. EXAM: CHEST - 2 VIEW COMPARISON:  Chest two views 07/31/2022 and 07/17/2022 FINDINGS: Left chest wall porta catheter tip overlies the superior vena cava/right atrial junction. Cardiac silhouette is normal in size. Moderate calcifications within the aortic arch. Findings the diaphragms and moderate hyperinflation. Increased lucencies within the upper lungs consistent with the emphysematous changes better seen on prior CT. Mild left costophrenic angle scarring is unchanged from multiple prior radiographs. No new focal airspace opacity. No pleural effusion or pneumothorax. Mild multilevel degenerative disc changes of the thoracic spine. IMPRESSION: 1. No active cardiopulmonary disease. 2. COPD. 3. Aortic atherosclerosis. 4. Mild left costophrenic  angle scarring, unchanged. Electronically Signed   By: Yvonne Kendall M.D.   On: 10/03/2022 18:01    EKG: I independently viewed the EKG done and my findings are as followed: Sinus tachycardia with PACs at a rate of 109 bpm  Assessment/Plan Present on Admission:  Symptomatic anemia  COPD exacerbation ([place])  Acute on chronic respiratory failure with hypoxia ([place])  GERD (gastroesophageal reflux disease)  Essential hypertension  Mixed hyperlipidemia  Ovarian cancer ([place])  Iron deficiency anemia  Principal Problem:   Symptomatic anemia Active Problems:   Essential hypertension   Iron deficiency anemia   Acute on chronic respiratory failure with hypoxia (HCC)   Mixed hyperlipidemia   Ovarian cancer (HCC)   GERD (gastroesophageal reflux disease)   COPD exacerbation (HCC)   Macrocytic anemia   Pancytopenia (HCC)  Symptomatic anemia H/H=H/H 6.7/20.3 (this was 8.3/25.2 on 08/27/2022). Hemoccult was  negative Type and crossmatch was done 1 unit of PRBC was ordered to be transfused in the ED  COPD exacerbation Acute on chronic respiratory failure with hypoxia Continue duo nebs, Robitussin, Mucinex, Solu-Medrol, azithromycin. Continue Protonix to prevent steroid-induced ulcer Continue incentive spirometry and flutter valve Continue supplemental oxygen to maintain O2 sat > 92% with plan to wean patient off oxygen as tolerated  Iron deficiency anemia Continue ferrous sulfate  Macrocytic anemia MCV 101.0; folate and vitamin B12 will be checked  Pancytopenia possibly secondary to chemotherapy WBC 1.6, hemoglobin 6.7, hematocrit 20.3, platelets 34 Consider hematology consult  Ovarian cancer Continue Zejula  GERD Continue Protonix  Essential hypertension Continue amlodipine  Mixed hyperlipidemia Continue Lipitor  Depression Continue Pamelor   DVT prophylaxis: SCDs  Code Status: Full code  Consults: None  Family Communication: None at bedside  Severity of Illness: The appropriate patient status for this patient is OBSERVATION. Observation status is judged to be reasonable and necessary in order to provide the required intensity of service to ensure the patient's safety. The patient's presenting symptoms, physical exam findings, and initial radiographic and laboratory data in the context of their medical condition is felt to place them at decreased risk for further clinical deterioration. Furthermore, it is anticipated that the patient will be medically stable for discharge from the hospital within 2 midnights of admission.   Author: Bernadette Hoit, DO 10/03/2022 10:01 PM  For on call review [email or web address].

## 2022-10-04 DIAGNOSIS — D61818 Other pancytopenia: Secondary | ICD-10-CM | POA: Diagnosis not present

## 2022-10-04 DIAGNOSIS — J441 Chronic obstructive pulmonary disease with (acute) exacerbation: Secondary | ICD-10-CM | POA: Diagnosis not present

## 2022-10-04 DIAGNOSIS — D649 Anemia, unspecified: Secondary | ICD-10-CM | POA: Diagnosis not present

## 2022-10-04 LAB — COMPREHENSIVE METABOLIC PANEL
ALT: 11 U/L (ref 0–44)
AST: 19 U/L (ref 15–41)
Albumin: 3.3 g/dL — ABNORMAL LOW (ref 3.5–5.0)
Alkaline Phosphatase: 83 U/L (ref 38–126)
Anion gap: 9 (ref 5–15)
BUN: 9 mg/dL (ref 8–23)
CO2: 29 mmol/L (ref 22–32)
Calcium: 8.4 mg/dL — ABNORMAL LOW (ref 8.9–10.3)
Chloride: 97 mmol/L — ABNORMAL LOW (ref 98–111)
Creatinine, Ser: 0.52 mg/dL (ref 0.44–1.00)
GFR, Estimated: >60 mL/min (ref >60)
Glucose, Bld: 135 mg/dL — ABNORMAL HIGH (ref 70–99)
Potassium: 3.9 mmol/L (ref 3.5–5.1)
Sodium: 135 mmol/L (ref 135–145)
Total Bilirubin: 0.9 mg/dL (ref 0.3–1.2)
Total Protein: 6.3 g/dL — ABNORMAL LOW (ref 6.5–8.1)

## 2022-10-04 LAB — FOLATE: Folate: 6.2 ng/mL (ref >5.9)

## 2022-10-04 LAB — CBC
HCT: 26 % — ABNORMAL LOW (ref 36.0–46.0)
Hemoglobin: 9 g/dL — ABNORMAL LOW (ref 12.0–15.0)
MCH: 32 pg (ref 26.0–34.0)
MCHC: 34.6 g/dL (ref 30.0–36.0)
MCV: 92.5 fL (ref 80.0–100.0)
Platelets: 34 10*3/uL — ABNORMAL LOW (ref 150–400)
RBC: 2.81 MIL/uL — ABNORMAL LOW (ref 3.87–5.11)
RDW: 18.1 % — ABNORMAL HIGH (ref 11.5–15.5)
WBC: 1 10*3/uL — CL (ref 4.0–10.5)
nRBC: 0 % (ref 0.0–0.2)

## 2022-10-04 LAB — PHOSPHORUS: Phosphorus: 4 mg/dL (ref 2.5–4.6)

## 2022-10-04 LAB — TYPE AND SCREEN
ABO/RH(D): O NEG
Antibody Screen: NEGATIVE
Unit division: 0

## 2022-10-04 LAB — BPAM RBC
Blood Product Expiration Date: 202312152359
ISSUE DATE / TIME: 202311162345
Unit Type and Rh: 9500

## 2022-10-04 LAB — VITAMIN B12: Vitamin B-12: 290 pg/mL (ref 180–914)

## 2022-10-04 LAB — MAGNESIUM: Magnesium: 2.1 mg/dL (ref 1.7–2.4)

## 2022-10-04 MED ORDER — IPRATROPIUM-ALBUTEROL 0.5-2.5 (3) MG/3ML IN SOLN
3.0000 mL | Freq: Two times a day (BID) | RESPIRATORY_TRACT | Status: DC
  Administered 2022-10-04 – 2022-10-10 (×13): 3 mL via RESPIRATORY_TRACT
  Filled 2022-10-04 (×13): qty 3

## 2022-10-04 MED ORDER — TBO-FILGRASTIM 300 MCG/0.5ML ~~LOC~~ SOSY
300.0000 ug | PREFILLED_SYRINGE | SUBCUTANEOUS | Status: AC
  Administered 2022-10-04 – 2022-10-05 (×2): 300 ug via SUBCUTANEOUS
  Filled 2022-10-04 (×2): qty 0.5

## 2022-10-04 NOTE — Progress Notes (Signed)
PROGRESS NOTE   [name]  SAY:[id number]    DOB: Jan 05, 1956    DOA: 10/03/2022  PCP: [name], MD   I have briefly reviewed patients previous medical records in [facility].  Chief Complaint  Patient presents with   Dizziness   Shortness of Breath    Brief Narrative:  66 year old female, PMH of ovarian cancer on oral chemotherapy (Zejula), COPD, chronic respiratory failure with hypoxia on home oxygen 3 L/min, HTN, HLD, presented to the ED due to 2 days history of cough, increasing dyspnea requiring increased off supplemental oxygen with ambulation.  Dizziness and lightheadedness.  Admitted for COPD exacerbation, pancytopenia, symptomatic anemia.  S/p 1 unit PRBC transfusion.  Hematology consulted.   Assessment & Plan:  Principal Problem:   Symptomatic anemia Active Problems:   Essential hypertension   Iron deficiency anemia   Acute on chronic respiratory failure with hypoxia (HCC)   Mixed hyperlipidemia   Ovarian cancer (HCC)   GERD (gastroesophageal reflux disease)   COPD exacerbation (HCC)   Macrocytic anemia   Pancytopenia (HCC)   COPD exacerbation: CTA chest without acute PE or aortic dissection.  Showed emphysema.  No acute airspace disease.  Did show some bronchiectasis.  Improving on current regimen.  Continue IV Solu-Medrol, azithromycin, DuoNebs, incentive spirometry, flutter valve, oxygen support and supportive treatment with cough medicines.  Is back on her home level of oxygen 3 L/min at rest but will need to ambulate and check.  Chronic respiratory failure with hypoxia: Remains on home level of oxygen 3 L/min via [place] since admission.  Pancytopenia/symptomatic anemia/iron deficiency anemia/microcytic anemia: Pancytopenia likely related to chemotherapy, consulted hematology for assistance.  Presented with WBC of 1.6, ANC of 0.6, hemoglobin 6.7 and platelets of 34.  No fever or overt bleeding.  Post 1 unit PRBC, hemoglobin up to 9.  Folate 6.2, B12 290.   Continue iron supplements.  FOBT negative.  Neutropenic precautions.  CT head obtained for dizziness was negative for acute findings.  Ovarian cancer: Continue Zejula.  Hematology consulted.  GERD: Continue PPI  Hypertension: Continue amlodipine  Mixed hyperlipidemia: Continue Lipitor  Depression: Continue Pamelor  Body mass index is 22.65 kg/m.    DVT prophylaxis: SCDs Start: 10/03/22 2145     Code Status: Full Code:  Family Communication: None at bedside Disposition:  Status is: Observation The patient remains OBS appropriate and will d/c before 2 midnights.  Possible DC home in a.m. pending improvement in her COPD and stabilization of her CBC.     Consultants:   Hematology  Procedures:     Antimicrobials:   Azithromycin   Subjective:  Feels better.  Dyspnea improved but breathing not yet back to baseline.  Cough productive of some green sputum.  Objective:   Vitals:   10/04/22 0306 10/04/22 0558 10/04/22 0801 10/04/22 0835  BP: 117/70 118/73  119/66  Pulse: 83 88  95  Resp: 18 16  (!) 22  Temp: 97.9 F (36.6 C) 97.6 F (36.4 C)  98.1 F (36.7 C)  TempSrc: Oral Oral    SpO2: 100% 99% 96% 98%  Weight:  52.6 kg    Height:  5' (1.524 m)      General exam: Middle-age female, looks older than stated age, small built, frail and chronically ill looking sitting up comfortably in bed undergoing nebulizations. Respiratory system: Slightly harsh breath sounds truly with few bilateral expiratory rhonchi.  No crackles.  No increased work of breathing. Cardiovascular system: S1 & S2 heard,  RRR. No JVD, murmurs, rubs, gallops or clicks. No pedal edema.  Telemetry personally reviewed: Sinus rhythm. Gastrointestinal system: Abdomen is nondistended, soft and nontender. No organomegaly or masses felt. Normal bowel sounds heard. Central nervous system: Alert and oriented. No focal neurological deficits. Extremities: Symmetric 5 x 5 power. Skin: No rashes, lesions or  ulcers Psychiatry: Judgement and insight appear normal. Mood & affect appropriate.     Data Reviewed:   I have personally reviewed following labs and imaging studies   CBC: Recent Labs  Lab 10/03/22 1817 10/04/22 0326  WBC 1.6* 1.0*  NEUTROABS 0.6*  --   HGB 6.7* 9.0*  HCT 20.3* 26.0*  MCV 101.0* 92.5  PLT 34* 34*    Basic Metabolic Panel: Recent Labs  Lab 10/03/22 1817 10/04/22 0326  NA 134* 135  K 3.5 3.9  CL 93* 97*  CO2 31 29  GLUCOSE 117* 135*  BUN 11 9  CREATININE 0.66 0.52  CALCIUM 8.8* 8.4*  MG  --  2.1  PHOS  --  4.0    Liver Function Tests: Recent Labs  Lab 10/03/22 1817 10/04/22 0326  AST 23 19  ALT 13 11  ALKPHOS 89 83  BILITOT 0.4 0.9  PROT 6.5 6.3*  ALBUMIN 3.6 3.3*    CBG: No results for input(s): "GLUCAP" in the last 168 hours.  Microbiology Studies:   Recent Results (from the past 240 hour(s))  Resp Panel by RT-PCR (Flu A&B, Covid) Anterior Nasal Swab     Status: None   Collection Time: 10/03/22  6:23 PM   Specimen: Anterior Nasal Swab  Result Value Ref Range Status   SARS Coronavirus 2 by RT PCR NEGATIVE NEGATIVE Final    Comment: (NOTE) SARS-CoV-2 target nucleic acids are NOT DETECTED.  The SARS-CoV-2 RNA is generally detectable in upper respiratory specimens during the acute phase of infection. The lowest concentration of SARS-CoV-2 viral copies this assay can detect is 138 copies/mL. A negative result does not preclude SARS-Cov-2 infection and should not be used as the sole basis for treatment or other patient management decisions. A negative result may occur with  improper specimen collection/handling, submission of specimen other than nasopharyngeal swab, presence of viral mutation(s) within the areas targeted by this assay, and inadequate number of viral copies(<138 copies/mL). A negative result must be combined with clinical observations, patient history, and epidemiological information. The expected result is  Negative.  Fact Sheet for Patients:  EntrepreneurPulse.com.au  Fact Sheet for Healthcare Providers:  IncredibleEmployment.be  This test is no t yet approved or cleared by the [place] FDA and  has been authorized for detection and/or diagnosis of SARS-CoV-2 by FDA under an Emergency Use Authorization (EUA). This EUA will remain  in effect (meaning this test can be used) for the duration of the COVID-19 declaration under Section 564(b)(1) of the Act, 21 U.S.C.section 360bbb-3(b)(1), unless the authorization is terminated  or revoked sooner.       Influenza A by PCR NEGATIVE NEGATIVE Final   Influenza B by PCR NEGATIVE NEGATIVE Final    Comment: (NOTE) The Xpert Xpress SARS-CoV-2/FLU/RSV plus assay is intended as an aid in the diagnosis of influenza from Nasopharyngeal swab specimens and should not be used as a sole basis for treatment. Nasal washings and aspirates are unacceptable for Xpert Xpress SARS-CoV-2/FLU/RSV testing.  Fact Sheet for Patients: EntrepreneurPulse.com.au  Fact Sheet for Healthcare Providers: IncredibleEmployment.be  This test is not yet approved or cleared by the [place] and has been authorized  for detection and/or diagnosis of SARS-CoV-2 by FDA under an Emergency Use Authorization (EUA). This EUA will remain in effect (meaning this test can be used) for the duration of the COVID-19 declaration under Section 564(b)(1) of the Act, 21 U.S.C. section 360bbb-3(b)(1), unless the authorization is terminated or revoked.  Performed at [facility], [address] [place], [place] [address]     Radiology Studies:  CT Angio Chest PE W and/or Wo Contrast  Result Date: 10/03/2022 CLINICAL DATA:  Shortness of breath and dizzy EXAM: CT ANGIOGRAPHY CHEST WITH CONTRAST TECHNIQUE: Multidetector CT imaging of the chest was performed using the standard protocol during bolus  administration of intravenous contrast. Multiplanar CT image reconstructions and MIPs were obtained to evaluate the vascular anatomy. RADIATION DOSE REDUCTION: This exam was performed according to the departmental dose-optimization program which includes automated exposure control, adjustment of the mA and/or kV according to patient size and/or use of iterative reconstruction technique. CONTRAST:  75m OMNIPAQUE IOHEXOL 350 MG/ML SOLN COMPARISON:  Chest x-ray 10/03/2022, CT chest 07/31/2022 FINDINGS: Cardiovascular: Satisfactory opacification of the pulmonary arteries to the segmental level. No evidence of pulmonary embolism. Non aneurysmal aorta. Moderate aortic atherosclerosis. No dissection is seen. Normal cardiac size. No pericardial effusion. Mediastinum/Nodes: Midline trachea. No thyroid mass. Stable subcentimeter mediastinal lymph nodes. Small hilar nodes. Esophagus within normal limits. Small hiatal hernia. Lungs/Pleura: Emphysema. No acute airspace disease. No enlarging pulmonary nodules. Stable scattered clustered central lobular nodules. Mild lower and upper lobe bronchiectasis and bronchial wall thickening. Upper Abdomen: No acute finding Musculoskeletal: No acute osseous abnormality. Mild compression deformity of T8, stable since September. Review of the MIP images confirms the above findings. IMPRESSION: 1. Negative for acute pulmonary embolus or aortic dissection. 2. Emphysema. No acute airspace disease. Mild bronchiectasis and bronchial wall thickening with scattered clustered central lobular nodules, stable to recent priors. No enlarging pulmonary nodules 3. Aortic atherosclerosis. Aortic Atherosclerosis (ICD10-I70.0) and Emphysema (ICD10-J43.9). Electronically Signed   By: KDonavan FoilM.D.   On: 10/03/2022 20:40   CT Head Wo Contrast  Result Date: 10/03/2022 CLINICAL DATA:  Shortness of breath, dizziness EXAM: CT HEAD WITHOUT CONTRAST TECHNIQUE: Contiguous axial images were obtained from the  base of the skull through the vertex without intravenous contrast. RADIATION DOSE REDUCTION: This exam was performed according to the departmental dose-optimization program which includes automated exposure control, adjustment of the mA and/or kV according to patient size and/or use of iterative reconstruction technique. COMPARISON:  01/28/2022 FINDINGS: Brain: No acute intracranial abnormality. Specifically, no hemorrhage, hydrocephalus, mass lesion, acute infarction, or significant intracranial injury. Vascular: No hyperdense vessel or unexpected calcification. Skull: No acute calvarial abnormality. Sinuses/Orbits: No acute findings Other: None IMPRESSION: No acute intracranial abnormality. Electronically Signed   By: KRolm BaptiseM.D.   On: 10/03/2022 20:22   DG Chest 2 View  Result Date: 10/03/2022 CLINICAL DATA:  Shortness of breath and dizziness for 2 weeks. Cough with productive green mucus for 2 days. History of COPD. EXAM: CHEST - 2 VIEW COMPARISON:  Chest two views 07/31/2022 and 07/17/2022 FINDINGS: Left chest wall porta catheter tip overlies the superior vena cava/right atrial junction. Cardiac silhouette is normal in size. Moderate calcifications within the aortic arch. Findings the diaphragms and moderate hyperinflation. Increased lucencies within the upper lungs consistent with the emphysematous changes better seen on prior CT. Mild left costophrenic angle scarring is unchanged from multiple prior radiographs. No new focal airspace opacity. No pleural effusion or pneumothorax. Mild multilevel degenerative disc changes of the thoracic spine.  IMPRESSION: 1. No active cardiopulmonary disease. 2. COPD. 3. Aortic atherosclerosis. 4. Mild left costophrenic angle scarring, unchanged. Electronically Signed   By: Yvonne Kendall M.D.   On: 10/03/2022 18:01    Scheduled Meds:    amLODipine  5 mg Oral QHS   atorvastatin  40 mg Oral Daily   azithromycin  250 mg Oral Daily   dextromethorphan-guaiFENesin   1 tablet Oral BID   ferrous sulfate  325 mg Oral QODAY   ipratropium-albuterol  3 mL Nebulization BID   methylPREDNISolone (SOLU-MEDROL) injection  40 mg Intravenous Q12H   niraparib tosylate  200 mg Oral Daily   nortriptyline  25 mg Oral QHS   pantoprazole  40 mg Oral Daily    Continuous Infusions:     LOS: 0 days     Vernell Leep, MD,  FACP, FHM, SFHM, [facility], [place]     To contact the attending provider between 7A-7P or the covering provider during after hours 7P-7A, please log into the web site [email or web address] and access using universal [place] password for that web site. If you do not have the password, please call the hospital operator.  10/04/2022, 10:25 AM

## 2022-10-04 NOTE — Progress Notes (Signed)
   10/04/22 8648  Provider Notification  Provider Name/Title Dr. Josephine Cables  Date Provider Notified 10/04/22  Time Provider Notified [phone]  Method of Notification  (Secure Chat)  Notification Reason Critical Result  Test performed and critical result WBC 1.0  Date Critical Result Received 10/04/22  Time Critical Result Received 0630  Provider response No new orders  Date of Provider Response 10/03/22  Time of Provider Response [phone]   Critical Result: WBC 1.0 Dr. Josephine Cables notified. Provider seem message, No new orders in the chart at this time. Continuing to monitor.

## 2022-10-04 NOTE — TOC Progression Note (Signed)
  Transition of Care [facility]) Screening Note   Patient Details  Name: [name] Date of Birth: November 13, 1956   [facility]) CM/SW Contact:    Boneta Lucks, RN Phone Number: 10/04/2022, 3:19 PM    Transition of Care Department [facility]) has reviewed patient and no TOC needs have been identified at this time. We will continue to monitor patient advancement through interdisciplinary progression rounds. If new patient transition needs arise, please place a TOC consult.       Barriers to Discharge: Continued Medical Work up

## 2022-10-04 NOTE — Progress Notes (Signed)
Patient slept on and off this shift, no complaints of pain. Patient received one unit of PRBC this shift. No complications. Continued to monitor patient.

## 2022-10-04 NOTE — Progress Notes (Signed)
Prior-To-Admission Oral Chemotherapy for Treatment of Oncologic Disease   Order noted from Dr. Josephine Cables to continue prior-to-admission oral chemotherapy regimen of Zejula.  Procedure Per Pharmacy & Therapeutics Committee Policy: Orders for continuation of home oral chemotherapy for treatment of an oncologic disease will be held unless approved by an oncologist during current admission.    For patients receiving oncology care at [facility], inpatient pharmacist contacts patient's oncologist during regular office hours to review. If earlier review is medically necessary, attending physician consults [facility] on-call oncologist   For patients receiving oncology care outside of [facility], attending physician consults patient's oncologist to review. If this oncologist or their coverage cannot be reached, attending physician consults [facility] on-call oncologist   Oral chemotherapy continuation order is on hold pending oncologist review, [facility] oncologist [name] will be notified by inpatient pharmacy during office hours.  Lu Duffel, PharmD, MBA Operations Pharmacist

## 2022-10-05 DIAGNOSIS — Z7952 Long term (current) use of systemic steroids: Secondary | ICD-10-CM | POA: Diagnosis not present

## 2022-10-05 DIAGNOSIS — T451X5A Adverse effect of antineoplastic and immunosuppressive drugs, initial encounter: Secondary | ICD-10-CM | POA: Diagnosis present

## 2022-10-05 DIAGNOSIS — J189 Pneumonia, unspecified organism: Secondary | ICD-10-CM | POA: Diagnosis present

## 2022-10-05 DIAGNOSIS — Z8249 Family history of ischemic heart disease and other diseases of the circulatory system: Secondary | ICD-10-CM | POA: Diagnosis not present

## 2022-10-05 DIAGNOSIS — Z9981 Dependence on supplemental oxygen: Secondary | ICD-10-CM | POA: Diagnosis not present

## 2022-10-05 DIAGNOSIS — D61818 Other pancytopenia: Secondary | ICD-10-CM | POA: Diagnosis not present

## 2022-10-05 DIAGNOSIS — Z87891 Personal history of nicotine dependence: Secondary | ICD-10-CM | POA: Diagnosis not present

## 2022-10-05 DIAGNOSIS — E876 Hypokalemia: Secondary | ICD-10-CM | POA: Diagnosis present

## 2022-10-05 DIAGNOSIS — J9621 Acute and chronic respiratory failure with hypoxia: Secondary | ICD-10-CM | POA: Diagnosis present

## 2022-10-05 DIAGNOSIS — I10 Essential (primary) hypertension: Secondary | ICD-10-CM | POA: Diagnosis present

## 2022-10-05 DIAGNOSIS — J440 Chronic obstructive pulmonary disease with acute lower respiratory infection: Secondary | ICD-10-CM | POA: Diagnosis present

## 2022-10-05 DIAGNOSIS — D61810 Antineoplastic chemotherapy induced pancytopenia: Secondary | ICD-10-CM | POA: Diagnosis present

## 2022-10-05 DIAGNOSIS — D649 Anemia, unspecified: Secondary | ICD-10-CM | POA: Diagnosis not present

## 2022-10-05 DIAGNOSIS — E782 Mixed hyperlipidemia: Secondary | ICD-10-CM | POA: Diagnosis present

## 2022-10-05 DIAGNOSIS — J470 Bronchiectasis with acute lower respiratory infection: Secondary | ICD-10-CM | POA: Diagnosis present

## 2022-10-05 DIAGNOSIS — J439 Emphysema, unspecified: Secondary | ICD-10-CM | POA: Diagnosis present

## 2022-10-05 DIAGNOSIS — D509 Iron deficiency anemia, unspecified: Secondary | ICD-10-CM | POA: Diagnosis present

## 2022-10-05 DIAGNOSIS — J441 Chronic obstructive pulmonary disease with (acute) exacerbation: Secondary | ICD-10-CM | POA: Diagnosis present

## 2022-10-05 DIAGNOSIS — K219 Gastro-esophageal reflux disease without esophagitis: Secondary | ICD-10-CM | POA: Diagnosis present

## 2022-10-05 DIAGNOSIS — Z20822 Contact with and (suspected) exposure to covid-19: Secondary | ICD-10-CM | POA: Diagnosis present

## 2022-10-05 DIAGNOSIS — F32A Depression, unspecified: Secondary | ICD-10-CM | POA: Diagnosis present

## 2022-10-05 DIAGNOSIS — C569 Malignant neoplasm of unspecified ovary: Secondary | ICD-10-CM | POA: Diagnosis present

## 2022-10-05 DIAGNOSIS — D638 Anemia in other chronic diseases classified elsewhere: Secondary | ICD-10-CM | POA: Diagnosis present

## 2022-10-05 DIAGNOSIS — I700 Atherosclerosis of aorta: Secondary | ICD-10-CM | POA: Diagnosis present

## 2022-10-05 DIAGNOSIS — I739 Peripheral vascular disease, unspecified: Secondary | ICD-10-CM | POA: Diagnosis present

## 2022-10-05 DIAGNOSIS — D539 Nutritional anemia, unspecified: Secondary | ICD-10-CM | POA: Diagnosis present

## 2022-10-05 LAB — CBC WITH DIFFERENTIAL/PLATELET
Abs Immature Granulocytes: 0.01 10*3/uL (ref 0.00–0.07)
Basophils Absolute: 0 10*3/uL (ref 0.0–0.1)
Basophils Relative: 1 %
Eosinophils Absolute: 0 10*3/uL (ref 0.0–0.5)
Eosinophils Relative: 0 %
HCT: 27.2 % — ABNORMAL LOW (ref 36.0–46.0)
Hemoglobin: 9.1 g/dL — ABNORMAL LOW (ref 12.0–15.0)
Immature Granulocytes: 1 %
Lymphocytes Relative: 23 %
Lymphs Abs: 0.3 10*3/uL — ABNORMAL LOW (ref 0.7–4.0)
MCH: 31.5 pg (ref 26.0–34.0)
MCHC: 33.5 g/dL (ref 30.0–36.0)
MCV: 94.1 fL (ref 80.0–100.0)
Monocytes Absolute: 0.1 10*3/uL (ref 0.1–1.0)
Monocytes Relative: 11 %
Neutro Abs: 0.7 10*3/uL — ABNORMAL LOW (ref 1.7–7.7)
Neutrophils Relative %: 64 %
Platelets: 55 10*3/uL — ABNORMAL LOW (ref 150–400)
RBC: 2.89 MIL/uL — ABNORMAL LOW (ref 3.87–5.11)
RDW: 18.5 % — ABNORMAL HIGH (ref 11.5–15.5)
WBC: 1.2 10*3/uL — CL (ref 4.0–10.5)
nRBC: 0 % (ref 0.0–0.2)

## 2022-10-05 MED ORDER — CHLORHEXIDINE GLUCONATE CLOTH 2 % EX PADS
6.0000 | MEDICATED_PAD | Freq: Every day | CUTANEOUS | Status: DC
  Administered 2022-10-06 – 2022-10-10 (×5): 6 via TOPICAL

## 2022-10-05 NOTE — Progress Notes (Addendum)
Pt's Peripheral IV in Left AC is not flushing due to apparent malposition. PT has a LEFT chest port that is not currently accessed so this RN reached out to [facility] for an order to access port. [name] [name]

## 2022-10-05 NOTE — Progress Notes (Signed)
PROGRESS NOTE   [name]  TDH:[id number]    DOB: 1956-08-10    DOA: 10/03/2022  PCP: [name], MD   I have briefly reviewed patients previous medical records in [facility].  Chief Complaint  Patient presents with   Dizziness   Shortness of Breath    Brief Narrative:  66 year old female, PMH of ovarian cancer on oral chemotherapy (Zejula), COPD, chronic respiratory failure with hypoxia on home oxygen 3 L/min, HTN, HLD, presented to the ED due to 2 days history of cough, increasing dyspnea requiring increased off supplemental oxygen with ambulation.  Dizziness and lightheadedness.  Admitted for COPD exacerbation, pancytopenia, symptomatic anemia.  S/p 1 unit PRBC transfusion.  Discussed with hematology/[name].   Assessment & Plan:  Principal Problem:   Symptomatic anemia Active Problems:   Essential hypertension   Iron deficiency anemia   Acute on chronic respiratory failure with hypoxia (HCC)   Mixed hyperlipidemia   Ovarian cancer (HCC)   GERD (gastroesophageal reflux disease)   COPD exacerbation (HCC)   Macrocytic anemia   Pancytopenia (HCC)   COPD exacerbation: CTA chest without acute PE or aortic dissection.  Showed emphysema.  No acute airspace disease.  Did show some bronchiectasis.  Improving on current regimen.  Continue IV Solu-Medrol, azithromycin, DuoNebs, incentive spirometry, flutter valve, oxygen support and supportive treatment with cough medicines.  Is back on her home level of oxygen 3 L/min at rest but will need to ambulate and check.  Still with some DOE.  Slowly improving.  Continue above management.  Follows with Dr. Raul Del, Pulmonology in [place] and Dr. [name], Pulmonology in [place] [employer].  Some of her worsening breathing may also be due to bronchiectasis flare.  Chronic respiratory failure with hypoxia: Remains on home level of oxygen 3 L/min via [place] since admission.  Pancytopenia/symptomatic anemia/iron deficiency anemia/microcytic  anemia: Pancytopenia likely related to chemotherapy, consulted hematology for assistance.  Presented with WBC of 1.6, ANC of 0.6, hemoglobin 6.7 and platelets of 34.  No fever or overt bleeding.  Post 1 unit PRBC, hemoglobin up to 9.  Folate 6.2, B12 290.  Continue iron supplements.  FOBT negative.  Neutropenic precautions.  CT head obtained for dizziness was negative for acute findings.  Discussed with [name], hematology on 11/17, reviewed lab results with him, indicated that Zejula can cause pancytopenia, recommended discontinuing Zejula, starting Granix 300 mg subcutaneously daily x2 doses, okay to DC home when [place] >1 K and no platelet transfusions unless platelet count less than 20 K or bleeding.  He will arrange follow-up early next week in office with labs.  Hemoglobin stable at 9.1.  WBC mildly improved to 1.2 with ANC of 0.7.  Platelets up to 55.  Ovarian cancer: As discussed with [name] on 11/17, hold Zejula until outpatient follow-up with him early next week.   GERD: Continue PPI.  Per family, Protonix reportedly causes chest pain and they prefer to continue home dose of omeprazole 20 mg daily.  Hypertension: Continue amlodipine.  Controlled.  Mixed hyperlipidemia: Continue Lipitor  Depression: Continue Pamelor  Body mass index is 22.65 kg/m.    DVT prophylaxis: SCDs Start: 10/03/22 2145     Code Status: Full Code:  Family Communication: None at bedside Disposition:  Status is: Care is already crossed 2 midnights.  Had medical intensity for inpatient based on persistent pancytopenia requiring Granix support, COPD exacerbation on IV Solu-Medrol.     Consultants:   Hematology  Procedures:     Antimicrobials:  Azithromycin   Subjective:  Cough improving, minimal greenish sputum but getting fainter.  No chest pain.  Dyspnea at rest improved.  Still with dyspnea on exertion while ambulating to bathroom and back.  Objective:   Vitals:   10/04/22 2000  10/04/22 2032 10/05/22 0402 10/05/22 0721  BP: 129/69  135/72   Pulse: 100  95   Resp: 20  20   Temp: 98.6 F (37 C)  97.8 F (36.6 C)   TempSrc: Oral     SpO2: 100% 98% 99% 100%  Weight:      Height:        General exam: Middle-age female, looks older than stated age, small built, frail and chronically ill looking sitting up comfortably in bed. Respiratory system: Improved breath sounds.  Clear to auscultation without any obvious audible wheezes or rhonchi.  Few basal crackles, chronic in Velcro like, likely related to bronchiectasis noted on CTA chest.  No increased work of breathing. Cardiovascular system: S1 & S2 heard, RRR. No JVD, murmurs, rubs, gallops or clicks. No pedal edema.  Telemetry personally reviewed: Sinus rhythm in the 90s.  Discontinued telemetry. Gastrointestinal system: Abdomen is nondistended, soft and nontender. No organomegaly or masses felt. Normal bowel sounds heard. Central nervous system: Alert and oriented. No focal neurological deficits. Extremities: Symmetric 5 x 5 power. Skin: No rashes, lesions or ulcers Psychiatry: Judgement and insight appear normal. Mood & affect appropriate.     Data Reviewed:   I have personally reviewed following labs and imaging studies   CBC: Recent Labs  Lab 10/03/22 1817 10/04/22 0326 10/05/22 0449  WBC 1.6* 1.0* 1.2*  NEUTROABS 0.6*  --  0.7*  HGB 6.7* 9.0* 9.1*  HCT 20.3* 26.0* 27.2*  MCV 101.0* 92.5 94.1  PLT 34* 34* 55*    Basic Metabolic Panel: Recent Labs  Lab 10/03/22 1817 10/04/22 0326  NA 134* 135  K 3.5 3.9  CL 93* 97*  CO2 31 29  GLUCOSE 117* 135*  BUN 11 9  CREATININE 0.66 0.52  CALCIUM 8.8* 8.4*  MG  --  2.1  PHOS  --  4.0    Liver Function Tests: Recent Labs  Lab 10/03/22 1817 10/04/22 0326  AST 23 19  ALT 13 11  ALKPHOS 89 83  BILITOT 0.4 0.9  PROT 6.5 6.3*  ALBUMIN 3.6 3.3*    CBG: No results for input(s): "GLUCAP" in the last 168 hours.  Microbiology Studies:    Recent Results (from the past 240 hour(s))  Resp Panel by RT-PCR (Flu A&B, Covid) Anterior Nasal Swab     Status: None   Collection Time: 10/03/22  6:23 PM   Specimen: Anterior Nasal Swab  Result Value Ref Range Status   SARS Coronavirus 2 by RT PCR NEGATIVE NEGATIVE Final    Comment: (NOTE) SARS-CoV-2 target nucleic acids are NOT DETECTED.  The SARS-CoV-2 RNA is generally detectable in upper respiratory specimens during the acute phase of infection. The lowest concentration of SARS-CoV-2 viral copies this assay can detect is 138 copies/mL. A negative result does not preclude SARS-Cov-2 infection and should not be used as the sole basis for treatment or other patient management decisions. A negative result may occur with  improper specimen collection/handling, submission of specimen other than nasopharyngeal swab, presence of viral mutation(s) within the areas targeted by this assay, and inadequate number of viral copies(<138 copies/mL). A negative result must be combined with clinical observations, patient history, and epidemiological information. The expected result is Negative.  Fact  Sheet for Patients:  EntrepreneurPulse.com.au  Fact Sheet for Healthcare Providers:  IncredibleEmployment.be  This test is no t yet approved or cleared by the [place] FDA and  has been authorized for detection and/or diagnosis of SARS-CoV-2 by FDA under an Emergency Use Authorization (EUA). This EUA will remain  in effect (meaning this test can be used) for the duration of the COVID-19 declaration under Section 564(b)(1) of the Act, 21 U.S.C.section 360bbb-3(b)(1), unless the authorization is terminated  or revoked sooner.       Influenza A by PCR NEGATIVE NEGATIVE Final   Influenza B by PCR NEGATIVE NEGATIVE Final    Comment: (NOTE) The Xpert Xpress SARS-CoV-2/FLU/RSV plus assay is intended as an aid in the diagnosis of influenza from  Nasopharyngeal swab specimens and should not be used as a sole basis for treatment. Nasal washings and aspirates are unacceptable for Xpert Xpress SARS-CoV-2/FLU/RSV testing.  Fact Sheet for Patients: EntrepreneurPulse.com.au  Fact Sheet for Healthcare Providers: IncredibleEmployment.be  This test is not yet approved or cleared by the [place] FDA and has been authorized for detection and/or diagnosis of SARS-CoV-2 by FDA under an Emergency Use Authorization (EUA). This EUA will remain in effect (meaning this test can be used) for the duration of the COVID-19 declaration under Section 564(b)(1) of the Act, 21 U.S.C. section 360bbb-3(b)(1), unless the authorization is terminated or revoked.  Performed at [facility], [address] [place], [place] [address]     Radiology Studies:  CT Angio Chest PE W and/or Wo Contrast  Result Date: 10/03/2022 CLINICAL DATA:  Shortness of breath and dizzy EXAM: CT ANGIOGRAPHY CHEST WITH CONTRAST TECHNIQUE: Multidetector CT imaging of the chest was performed using the standard protocol during bolus administration of intravenous contrast. Multiplanar CT image reconstructions and MIPs were obtained to evaluate the vascular anatomy. RADIATION DOSE REDUCTION: This exam was performed according to the departmental dose-optimization program which includes automated exposure control, adjustment of the mA and/or kV according to patient size and/or use of iterative reconstruction technique. CONTRAST:  44m OMNIPAQUE IOHEXOL 350 MG/ML SOLN COMPARISON:  Chest x-ray 10/03/2022, CT chest 07/31/2022 FINDINGS: Cardiovascular: Satisfactory opacification of the pulmonary arteries to the segmental level. No evidence of pulmonary embolism. Non aneurysmal aorta. Moderate aortic atherosclerosis. No dissection is seen. Normal cardiac size. No pericardial effusion. Mediastinum/Nodes: Midline trachea. No thyroid mass. Stable subcentimeter  mediastinal lymph nodes. Small hilar nodes. Esophagus within normal limits. Small hiatal hernia. Lungs/Pleura: Emphysema. No acute airspace disease. No enlarging pulmonary nodules. Stable scattered clustered central lobular nodules. Mild lower and upper lobe bronchiectasis and bronchial wall thickening. Upper Abdomen: No acute finding Musculoskeletal: No acute osseous abnormality. Mild compression deformity of T8, stable since September. Review of the MIP images confirms the above findings. IMPRESSION: 1. Negative for acute pulmonary embolus or aortic dissection. 2. Emphysema. No acute airspace disease. Mild bronchiectasis and bronchial wall thickening with scattered clustered central lobular nodules, stable to recent priors. No enlarging pulmonary nodules 3. Aortic atherosclerosis. Aortic Atherosclerosis (ICD10-I70.0) and Emphysema (ICD10-J43.9). Electronically Signed   By: KDonavan FoilM.D.   On: 10/03/2022 20:40   CT Head Wo Contrast  Result Date: 10/03/2022 CLINICAL DATA:  Shortness of breath, dizziness EXAM: CT HEAD WITHOUT CONTRAST TECHNIQUE: Contiguous axial images were obtained from the base of the skull through the vertex without intravenous contrast. RADIATION DOSE REDUCTION: This exam was performed according to the departmental dose-optimization program which includes automated exposure control, adjustment of the mA and/or kV according to patient size and/or use  of iterative reconstruction technique. COMPARISON:  01/28/2022 FINDINGS: Brain: No acute intracranial abnormality. Specifically, no hemorrhage, hydrocephalus, mass lesion, acute infarction, or significant intracranial injury. Vascular: No hyperdense vessel or unexpected calcification. Skull: No acute calvarial abnormality. Sinuses/Orbits: No acute findings Other: None IMPRESSION: No acute intracranial abnormality. Electronically Signed   By: Rolm Baptise M.D.   On: 10/03/2022 20:22   DG Chest 2 View  Result Date: 10/03/2022 CLINICAL  DATA:  Shortness of breath and dizziness for 2 weeks. Cough with productive green mucus for 2 days. History of COPD. EXAM: CHEST - 2 VIEW COMPARISON:  Chest two views 07/31/2022 and 07/17/2022 FINDINGS: Left chest wall porta catheter tip overlies the superior vena cava/right atrial junction. Cardiac silhouette is normal in size. Moderate calcifications within the aortic arch. Findings the diaphragms and moderate hyperinflation. Increased lucencies within the upper lungs consistent with the emphysematous changes better seen on prior CT. Mild left costophrenic angle scarring is unchanged from multiple prior radiographs. No new focal airspace opacity. No pleural effusion or pneumothorax. Mild multilevel degenerative disc changes of the thoracic spine. IMPRESSION: 1. No active cardiopulmonary disease. 2. COPD. 3. Aortic atherosclerosis. 4. Mild left costophrenic angle scarring, unchanged. Electronically Signed   By: Yvonne Kendall M.D.   On: 10/03/2022 18:01    Scheduled Meds:    amLODipine  5 mg Oral QHS   atorvastatin  40 mg Oral Daily   azithromycin  250 mg Oral Daily   dextromethorphan-guaiFENesin  1 tablet Oral BID   ferrous sulfate  325 mg Oral QODAY   ipratropium-albuterol  3 mL Nebulization BID   methylPREDNISolone (SOLU-MEDROL) injection  40 mg Intravenous Q12H   nortriptyline  25 mg Oral QHS   pantoprazole  40 mg Oral Daily   Tbo-Filgrastim  300 mcg Subcutaneous Q24H    Continuous Infusions:     LOS: 0 days     Vernell Leep, MD,  FACP, FHM, SFHM, [facility], [place]     To contact the attending provider between 7A-7P or the covering provider during after hours 7P-7A, please log into the web site [email or web address] and access using universal [place] password for that web site. If you do not have the password, please call the hospital operator.  10/05/2022, 9:26 AM

## 2022-10-06 DIAGNOSIS — J441 Chronic obstructive pulmonary disease with (acute) exacerbation: Secondary | ICD-10-CM | POA: Diagnosis not present

## 2022-10-06 DIAGNOSIS — D61818 Other pancytopenia: Secondary | ICD-10-CM | POA: Diagnosis not present

## 2022-10-06 DIAGNOSIS — D649 Anemia, unspecified: Secondary | ICD-10-CM | POA: Diagnosis not present

## 2022-10-06 LAB — CBC WITH DIFFERENTIAL/PLATELET
Abs Immature Granulocytes: 0.06 10*3/uL (ref 0.00–0.07)
Basophils Absolute: 0 10*3/uL (ref 0.0–0.1)
Basophils Relative: 1 %
Eosinophils Absolute: 0 10*3/uL (ref 0.0–0.5)
Eosinophils Relative: 1 %
HCT: 26.6 % — ABNORMAL LOW (ref 36.0–46.0)
Hemoglobin: 8.7 g/dL — ABNORMAL LOW (ref 12.0–15.0)
Immature Granulocytes: 6 %
Lymphocytes Relative: 28 %
Lymphs Abs: 0.3 10*3/uL — ABNORMAL LOW (ref 0.7–4.0)
MCH: 31.4 pg (ref 26.0–34.0)
MCHC: 32.7 g/dL (ref 30.0–36.0)
MCV: 96 fL (ref 80.0–100.0)
Monocytes Absolute: 0.4 10*3/uL (ref 0.1–1.0)
Monocytes Relative: 33 %
Neutro Abs: 0.3 10*3/uL — CL (ref 1.7–7.7)
Neutrophils Relative %: 31 %
Platelets: 69 10*3/uL — ABNORMAL LOW (ref 150–400)
RBC: 2.77 MIL/uL — ABNORMAL LOW (ref 3.87–5.11)
RDW: 17.9 % — ABNORMAL HIGH (ref 11.5–15.5)
WBC: 1.1 10*3/uL — CL (ref 4.0–10.5)
nRBC: 2.8 % — ABNORMAL HIGH (ref 0.0–0.2)

## 2022-10-06 MED ORDER — DM-GUAIFENESIN ER 30-600 MG PO TB12
2.0000 | ORAL_TABLET | Freq: Two times a day (BID) | ORAL | Status: DC
  Administered 2022-10-06 – 2022-10-10 (×8): 2 via ORAL
  Filled 2022-10-06 (×7): qty 2
  Filled 2022-10-06: qty 1

## 2022-10-06 MED ORDER — MOMETASONE FURO-FORMOTEROL FUM 200-5 MCG/ACT IN AERO
2.0000 | INHALATION_SPRAY | Freq: Two times a day (BID) | RESPIRATORY_TRACT | Status: DC
  Administered 2022-10-06 – 2022-10-10 (×9): 2 via RESPIRATORY_TRACT
  Filled 2022-10-06: qty 8.8

## 2022-10-06 MED ORDER — METHYLPREDNISOLONE SODIUM SUCC 125 MG IJ SOLR
60.0000 mg | Freq: Three times a day (TID) | INTRAMUSCULAR | Status: DC
  Administered 2022-10-06 – 2022-10-09 (×10): 60 mg via INTRAVENOUS
  Filled 2022-10-06 (×10): qty 2

## 2022-10-06 MED ORDER — TBO-FILGRASTIM 300 MCG/0.5ML ~~LOC~~ SOSY
300.0000 ug | PREFILLED_SYRINGE | SUBCUTANEOUS | Status: AC
  Administered 2022-10-06 – 2022-10-07 (×2): 300 ug via SUBCUTANEOUS
  Filled 2022-10-06 (×3): qty 0.5

## 2022-10-06 NOTE — Progress Notes (Signed)
Patient was a little winded when I arrived in her room. She had just returned to the bed from the restroom. Her SPO2 was 92% and it steadily increased back to her normal high 90s. By the time her nebulizer had finished she was completely recovered. No shortness of breath at this time.

## 2022-10-06 NOTE — Progress Notes (Signed)
PROGRESS NOTE   [name]  KWI:[id number]    DOB: [birth year]    DOA: 10/03/2022  PCP: [name], MD   I have briefly reviewed patients previous medical records in [facility].  Chief Complaint  Patient presents with   Dizziness   Shortness of Breath    Brief Narrative:  66 year old female, PMH of ovarian cancer on oral chemotherapy (Zejula), COPD, chronic respiratory failure with hypoxia on home oxygen 3 L/min, HTN, HLD, presented to the ED due to 2 days history of cough, increasing dyspnea requiring increased off supplemental oxygen with ambulation.  Dizziness and lightheadedness.  Admitted for COPD exacerbation, pancytopenia, symptomatic anemia.  S/p 1 unit PRBC transfusion.  Discussed with hematology/[name].   Assessment & Plan:  Principal Problem:   Symptomatic anemia Active Problems:   Essential hypertension   Iron deficiency anemia   Acute on chronic respiratory failure with hypoxia (HCC)   Mixed hyperlipidemia   Ovarian cancer (HCC)   GERD (gastroesophageal reflux disease)   COPD exacerbation (HCC)   Macrocytic anemia   Pancytopenia (HCC)   COPD exacerbation: CTA chest without acute PE or aortic dissection.  Showed emphysema.  No acute airspace disease.  Did show some bronchiectasis.  Continue azithromycin, DuoNebs, incentive spirometry, flutter valve (had not received this until this morning, requested RN to provide), oxygen support and supportive treatment with cough medicines.  Remains on home oxygen 3 L/min via [place] while at rest.  Follows with Dr. Raul Del, Pulmonology in [place] and Dr. [name], Pulmonology in [place].  Some of her worsening breathing may also be due to bronchiectasis flare.  Improvement has been mild and has plateaued since admission.  Still with significant DOE while returning from bathroom which is very short distance.  Increased Solu-Medrol to 60 mg every 8 hours, added Dulera and ensure that she gets a flutter valve.  Ambulate  patient.  Added Mucinex.  Chronic respiratory failure with hypoxia: Remains on home level of oxygen 3 L/min via [place] since admission.  Will need to do ambulatory oxygen evaluation as well.  Pancytopenia/symptomatic anemia/iron deficiency anemia/microcytic anemia: Pancytopenia likely related to chemotherapy, consulted hematology for assistance.  Presented with WBC of 1.6, ANC of 0.6, hemoglobin 6.7 and platelets of 34.  No fever or overt bleeding.  Post 1 unit PRBC, hemoglobin up to 9.  Folate 6.2, B12 290.  Continue iron supplements.  FOBT negative.  Neutropenic precautions.  CT head obtained for dizziness was negative for acute findings.  Discussed with [name], hematology on 11/17, reviewed lab results with him, indicated that Zejula can cause pancytopenia, recommended discontinuing Zejula, starting Granix 300 mg subcutaneously daily x2 doses, okay to DC home when [place] >1 K and no platelet transfusions unless platelet count less than 20 K or bleeding.  He will arrange follow-up early next week in office with labs.  Hemoglobin stable, thrombocytopenia improving but still significantly neutropenic with WBC 1.1, ANC worse than on admission at 0.3.  Continue Granix.  Plan to discuss with Dr. Raliegh Ip tomorrow.  Ovarian cancer: As discussed with [name] on 11/17, hold Zejula until outpatient follow-up with him early next week.   GERD: Continue PPI.  Per family, Protonix reportedly causes chest pain and they prefer to continue home dose of omeprazole 20 mg daily.  Hypertension: Continue amlodipine.  Controlled.  Mixed hyperlipidemia: Continue Lipitor  Depression: Continue Pamelor  Body mass index is 22.65 kg/m.    DVT prophylaxis: SCDs Start: 10/03/22 2145  Code Status: Full Code:  Family Communication: None at bedside Disposition:  Status is: Inpatient.  DC home pending improvement of COPD exacerbation and neutropenia.     Consultants:   Hematology  Procedures:      Antimicrobials:   Azithromycin   Subjective:  Cough is mild and clear.  Feels that her breathing has only improved by 20% since admission.  Still with significant DOE even on returning from the bathroom in her room.  At baseline she reports that she had some DOE but was able to do her home related activities.  Objective:   Vitals:   10/05/22 1941 10/05/22 1954 10/06/22 0539 10/06/22 0726  BP: (!) 143/78  127/75   Pulse: (!) 102  87   Resp: 20  20   Temp: 98.5 F (36.9 C)  98.1 F (36.7 C)   TempSrc: Oral  Oral   SpO2: 99% 99% 100% 98%  Weight:      Height:        General exam: Middle-age female, looks older than stated age, small built, frail and chronically ill looking sitting up comfortably in bed. Respiratory system: Sounds worse than the last 2 days.  Decreased breath sounds bilaterally with scattered bilateral medium pitched expiratory rhonchi.  No crackles.  No increased work of breathing at rest.  Able to speak in full sentences. Cardiovascular system: S1 and S2 heard, RRR.  No JVD, murmurs or pedal edema.  Telemetry discontinued. Gastrointestinal system: Abdomen is nondistended, soft and nontender. No organomegaly or masses felt. Normal bowel sounds heard. Central nervous system: Alert and oriented. No focal neurological deficits. Extremities: Symmetric 5 x 5 power. Skin: No rashes, lesions or ulcers Psychiatry: Judgement and insight appear normal. Mood & affect appropriate.     Data Reviewed:   I have personally reviewed following labs and imaging studies   CBC: Recent Labs  Lab 10/03/22 1817 10/04/22 0326 10/05/22 0449 10/06/22 0509  WBC 1.6* 1.0* 1.2* 1.1*  NEUTROABS 0.6*  --  0.7* 0.3*  HGB 6.7* 9.0* 9.1* 8.7*  HCT 20.3* 26.0* 27.2* 26.6*  MCV 101.0* 92.5 94.1 96.0  PLT 34* 34* 55* 69*    Basic Metabolic Panel: Recent Labs  Lab 10/03/22 1817 10/04/22 0326  NA 134* 135  K 3.5 3.9  CL 93* 97*  CO2 31 29  GLUCOSE 117* 135*  BUN 11 9   CREATININE 0.66 0.52  CALCIUM 8.8* 8.4*  MG  --  2.1  PHOS  --  4.0    Liver Function Tests: Recent Labs  Lab 10/03/22 1817 10/04/22 0326  AST 23 19  ALT 13 11  ALKPHOS 89 83  BILITOT 0.4 0.9  PROT 6.5 6.3*  ALBUMIN 3.6 3.3*    CBG: No results for input(s): "GLUCAP" in the last 168 hours.  Microbiology Studies:   Recent Results (from the past 240 hour(s))  Resp Panel by RT-PCR (Flu A&B, Covid) Anterior Nasal Swab     Status: None   Collection Time: 10/03/22  6:23 PM   Specimen: Anterior Nasal Swab  Result Value Ref Range Status   SARS Coronavirus 2 by RT PCR NEGATIVE NEGATIVE Final    Comment: (NOTE) SARS-CoV-2 target nucleic acids are NOT DETECTED.  The SARS-CoV-2 RNA is generally detectable in upper respiratory specimens during the acute phase of infection. The lowest concentration of SARS-CoV-2 viral copies this assay can detect is 138 copies/mL. A negative result does not preclude SARS-Cov-2 infection and should not be used as the sole basis for  treatment or other patient management decisions. A negative result may occur with  improper specimen collection/handling, submission of specimen other than nasopharyngeal swab, presence of viral mutation(s) within the areas targeted by this assay, and inadequate number of viral copies(<138 copies/mL). A negative result must be combined with clinical observations, patient history, and epidemiological information. The expected result is Negative.  Fact Sheet for Patients:  EntrepreneurPulse.com.au  Fact Sheet for Healthcare Providers:  IncredibleEmployment.be  This test is no t yet approved or cleared by the [place] FDA and  has been authorized for detection and/or diagnosis of SARS-CoV-2 by FDA under an Emergency Use Authorization (EUA). This EUA will remain  in effect (meaning this test can be used) for the duration of the COVID-19 declaration under Section 564(b)(1) of the  Act, 21 U.S.C.section 360bbb-3(b)(1), unless the authorization is terminated  or revoked sooner.       Influenza A by PCR NEGATIVE NEGATIVE Final   Influenza B by PCR NEGATIVE NEGATIVE Final    Comment: (NOTE) The Xpert Xpress SARS-CoV-2/FLU/RSV plus assay is intended as an aid in the diagnosis of influenza from Nasopharyngeal swab specimens and should not be used as a sole basis for treatment. Nasal washings and aspirates are unacceptable for Xpert Xpress SARS-CoV-2/FLU/RSV testing.  Fact Sheet for Patients: EntrepreneurPulse.com.au  Fact Sheet for Healthcare Providers: IncredibleEmployment.be  This test is not yet approved or cleared by the [place] FDA and has been authorized for detection and/or diagnosis of SARS-CoV-2 by FDA under an Emergency Use Authorization (EUA). This EUA will remain in effect (meaning this test can be used) for the duration of the COVID-19 declaration under Section 564(b)(1) of the Act, 21 U.S.C. section 360bbb-3(b)(1), unless the authorization is terminated or revoked.  Performed at [facility], [address] [place], [place] [address]     Radiology Studies:  No results found.  Scheduled Meds:    amLODipine  5 mg Oral QHS   atorvastatin  40 mg Oral Daily   azithromycin  250 mg Oral Daily   Chlorhexidine Gluconate Cloth  6 each Topical Daily   dextromethorphan-guaiFENesin  2 tablet Oral BID   ferrous sulfate  325 mg Oral QODAY   ipratropium-albuterol  3 mL Nebulization BID   methylPREDNISolone (SOLU-MEDROL) injection  60 mg Intravenous Q8H   mometasone-formoterol  2 puff Inhalation BID   nortriptyline  25 mg Oral QHS   pantoprazole  40 mg Oral Daily   Tbo-Filgrastim  300 mcg Subcutaneous Q24H    Continuous Infusions:     LOS: 1 day     Vernell Leep, MD,  FACP, FHM, SFHM, [facility], [facility]   [facility] [place]     To contact the attending provider  between 7A-7P or the covering provider during after hours 7P-7A, please log into the web site [email or web address] and access using universal [place] password for that web site. If you do not have the password, please call the hospital operator.  10/06/2022, 8:45 AM

## 2022-10-06 NOTE — Progress Notes (Addendum)
Ambulated 50 feet in hallway on 3 liters and said she had to go back to room due to being SOB.  Using flutter valve

## 2022-10-06 NOTE — Progress Notes (Signed)
Ambulated about 60 feet in hallway on 3 liters and stayed at 96% though stated felt very winded.  Using flutter valve.  Daughter at bedside.

## 2022-10-06 NOTE — Progress Notes (Addendum)
Pt tolerated Left chest port access well. Good blood return noted. She reports she has rested well overnight. No acute events occurred. Bryson Corona Edd Fabian

## 2022-10-07 ENCOUNTER — Other Ambulatory Visit (HOSPITAL_COMMUNITY): Payer: Self-pay

## 2022-10-07 ENCOUNTER — Inpatient Hospital Stay (HOSPITAL_COMMUNITY): Payer: Medicare HMO

## 2022-10-07 DIAGNOSIS — C569 Malignant neoplasm of unspecified ovary: Secondary | ICD-10-CM | POA: Diagnosis not present

## 2022-10-07 DIAGNOSIS — J9621 Acute and chronic respiratory failure with hypoxia: Secondary | ICD-10-CM | POA: Diagnosis not present

## 2022-10-07 DIAGNOSIS — D649 Anemia, unspecified: Secondary | ICD-10-CM | POA: Diagnosis not present

## 2022-10-07 DIAGNOSIS — E876 Hypokalemia: Secondary | ICD-10-CM

## 2022-10-07 DIAGNOSIS — J441 Chronic obstructive pulmonary disease with (acute) exacerbation: Secondary | ICD-10-CM | POA: Diagnosis not present

## 2022-10-07 DIAGNOSIS — D61818 Other pancytopenia: Secondary | ICD-10-CM | POA: Diagnosis not present

## 2022-10-07 LAB — BASIC METABOLIC PANEL
Anion gap: 10 (ref 5–15)
BUN: 13 mg/dL (ref 8–23)
CO2: 32 mmol/L (ref 22–32)
Calcium: 8.5 mg/dL — ABNORMAL LOW (ref 8.9–10.3)
Chloride: 95 mmol/L — ABNORMAL LOW (ref 98–111)
Creatinine, Ser: 0.57 mg/dL (ref 0.44–1.00)
GFR, Estimated: >60 mL/min (ref >60)
Glucose, Bld: 105 mg/dL — ABNORMAL HIGH (ref 70–99)
Potassium: 3.3 mmol/L — ABNORMAL LOW (ref 3.5–5.1)
Sodium: 137 mmol/L (ref 135–145)

## 2022-10-07 MED ORDER — FUROSEMIDE 10 MG/ML IJ SOLN
40.0000 mg | Freq: Once | INTRAMUSCULAR | Status: AC
  Administered 2022-10-07: 40 mg via INTRAVENOUS
  Filled 2022-10-07: qty 4

## 2022-10-07 MED ORDER — FUROSEMIDE 10 MG/ML IJ SOLN: 40.0000 mg | Freq: Once | INTRAMUSCULAR | Status: DC

## 2022-10-07 MED ORDER — POTASSIUM CHLORIDE CRYS ER 20 MEQ PO TBCR
40.0000 meq | EXTENDED_RELEASE_TABLET | Freq: Once | ORAL | Status: AC
  Administered 2022-10-07: 40 meq via ORAL
  Filled 2022-10-07: qty 2

## 2022-10-07 MED ORDER — PIPERACILLIN-TAZOBACTAM 3.375 G IVPB
3.3750 g | Freq: Three times a day (TID) | INTRAVENOUS | Status: DC
  Administered 2022-10-07 – 2022-10-10 (×8): 3.375 g via INTRAVENOUS
  Filled 2022-10-07 (×8): qty 50

## 2022-10-07 NOTE — Progress Notes (Signed)
pCXR suggests new AS dz LLL post segment in pt with very low PMNs in setting of underlying bronchiectasis so rec  1) MRSA Screen 2) Add zoysn per pharmacy consult 3) track Procalcitonin/ ESR  4) monitor sats continuously as has very little reserve  Christinia Gully, MD Pulmonary and [place] [phone]   After 7:00 pm call Elink  [phone]

## 2022-10-07 NOTE — TOC Initial Note (Signed)
Transition of Care [facility]) - Initial/Assessment Note    Patient Details  Name: [name] MRN: [id number] Date of Birth: [birth year]  Transition of [facility] CM/SW Contact:    Iona Beard, [place] Phone Number: 10/07/2022, 11:17 AM  Clinical Narrative:                 Pt is high risk for readmission. CSW spoke with pts daughter to complete assessment. Pt lives with her daughter. Pts daughter assists with ADLs and provides pt with transportation as needed. Pt has a walker to use when needed. Pt wear 3L O2 chronically. Pt had HH with [facility] recently but is no longer active. TOC to follow.   Expected Discharge Plan: [place] Barriers to Discharge: Continued Medical Work up   Patient Goals and CMS Choice Patient states their goals for this hospitalization and ongoing recovery are:: return home CMS Medicare.gov Compare Post Acute Care list provided to:: Patient Choice offered to / list presented to : Patient  Expected Discharge Plan and Services Expected Discharge Plan: [place] In-house Referral: Clinical Social Work Discharge Planning Services: CM Consult Post Acute Care Choice: [place] arrangements for the past 2 months: [place]                                      Prior Living Arrangements/Services Living arrangements for the past 2 months: Single Family Home Lives with:: Adult Children Patient language and need for interpreter reviewed:: Yes Do you feel safe going back to the place where you live?: Yes      Need for Family Participation in Patient Care: Yes (Comment) Care giver support system in place?: Yes (comment) Current home services: DME Criminal Activity/Legal Involvement Pertinent to Current Situation/Hospitalization: No - Comment as needed  Activities of Daily Living Home Assistive Devices/Equipment: Oxygen ADL Screening (condition at time of admission) Patient's cognitive ability  adequate to safely complete daily activities?: No Is the patient deaf or have difficulty hearing?: No Does the patient have difficulty seeing, even when wearing glasses/contacts?: No Does the patient have difficulty concentrating, remembering, or making decisions?: No Patient able to express need for assistance with ADLs?: Yes Does the patient have difficulty dressing or bathing?: No Independently performs ADLs?: Yes (appropriate for developmental age) Does the patient have difficulty walking or climbing stairs?: Yes Weakness of Legs: None Weakness of Arms/Hands: None  Permission Sought/Granted                  Emotional Assessment Appearance:: Appears stated age       Alcohol / Substance Use: Not Applicable Psych Involvement: No (comment)  Admission diagnosis:  COPD exacerbation ([place]) [J44.1] History of ovarian cancer [Z85.43] Pancytopenia ([place]) [D61.818] Symptomatic anemia [D64.9] Patient Active Problem List   Diagnosis Date Noted   Symptomatic anemia 10/03/2022   Macrocytic anemia 10/03/2022   Pancytopenia ([place]) 10/03/2022   Thrush 09/11/2022   Odynophagia 08/26/2022   COPD exacerbation ([place]) 07/31/2022   GERD (gastroesophageal reflux disease) 07/15/2022   ABLA (acute blood loss anemia)    Esophagitis 06/28/2022   Hematochezia 05/03/2022   E coli bacteremia 03/11/2022   Hypokalemia 03/11/2022   Hyponatremia 03/10/2022   Thrombocytopenia ([place]) 03/10/2022   Acute on chronic respiratory failure with hypoxia and hypercapnia ([place]) 12/30/2021   Deficiency anemia 05/23/2021   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  Sepsis due to undetermined organism ([place]) 12/18/2020   Lobar pneumonia ([place]) 12/18/2020   Syncope 12/17/2020   Genetic testing 08/01/2020   Ovarian cancer ([place]) 07/07/2020   Port-A-Cath in place 07/03/2020   Family history of prostate cancer 07/03/2020   Goals of care, counseling/discussion 07/01/2020   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   Pneumonia 09/13/2018   Acute on chronic respiratory failure ([place]) 01/22/2018   CAP (community acquired pneumonia) 01/18/2016   Iron deficiency anemia 11/07/2015   Chronic respiratory failure with hypoxia ([place]) 11/07/2015   Acute on chronic respiratory failure with hypoxia ([place]) 11/07/2015   Mixed hyperlipidemia 11/07/2015   COPD GOLD 4/ 02 dep 11/06/2015   Essential hypertension 11/06/2015   PCP:  [name], MD Pharmacy:   [place] [place] [facility] [place] [place] [address] Phone: [phone] Fax: [phone]     Social Determinants of Health (SDOH) Interventions    Readmission Risk Interventions    10/07/2022   11:16 AM 08/01/2022    2:58 PM 03/11/2022    7:53 AM  Readmission Risk Prevention Plan  Transportation Screening Complete Complete Complete  HRI or Home Care Consult   Complete  Social Work Consult for [place] Planning/Counseling   Complete  Palliative Care Screening   Not Applicable  Medication Review Press photographer) Complete Complete Complete  HRI or Home Care Consult Complete Complete   SW Recovery Care/Counseling Consult Complete Complete   Palliative Care Screening Not Applicable [place] Not Applicable Not Applicable

## 2022-10-07 NOTE — Consult Note (Signed)
[facility] Consultation Oncology  Name: [name]      MRN: [id number]    Location: A311/A311-01  Date: 10/07/2022 Time:4:46 PM   REFERRING PHYSICIAN: Dr. Algis Liming   REASON FOR CONSULT: Severe pancytopenia   DIAGNOSIS: Myelosuppression from niraparib  HISTORY OF PRESENT ILLNESS: [name] is a 66 year old very pleasant white female who is known to me from office visits.  She has a high-grade serous ovarian carcinoma for which she has completed 6 cycles of carboplatin and paclitaxel.  She was not a surgical candidate due to her severe COPD, we have started her on maintenance niraparib 200 mg daily on 05/25/2021.  She presented to the ER on 10/03/2022 with complaints of shortness of breath on exertion and lightheadedness.  She was found to have a hemoglobin of 6.7, white count of 1.6, PLT of 34.  She has received a blood transfusion and her hemoglobin today is 9.4.  We have discontinued niraparib and her platelet count has improved to 86 from 34.  WBC count was 1.0 on presentation which improved to 1.6.  However neutrophil count was remaining low at 300.  She has been receiving G-CSF 300 mcg daily for the last 3 days.  She reports that she has been feeling overall well but slightly more short of breath today.  She had a very minimal nosebleed this morning.  No other bleeding was reported.  PAST MEDICAL HISTORY:   Past Medical History:  Diagnosis Date   Anemia    Aortic atherosclerosis (HCC)    Arthritis    Asthma    Bilateral carotid artery stenosis    Cancer (HCC)    Gastric Cancer   COPD (chronic obstructive pulmonary disease) (HCC)    High cholesterol    Hyperlipidemia    Hypertension    Neuropathy    Osteoarthritis    Ovarian cancer ([place])    Oxygen dependent    Peripheral vascular disease ([place])    Peritoneal carcinoma ([place])    Pneumonia 2019   Port-A-Cath in place 07/03/2020   Pulmonary emphysema ([place])     ALLERGIES: No Known Allergies    MEDICATIONS: I have  reviewed the patient's current medications.     PAST SURGICAL HISTORY Past Surgical History:  Procedure Laterality Date   BIOPSY  06/30/2022   Procedure: BIOPSY;  Surgeon: Eloise Harman, DO;  [facility];  Service: Endoscopy;;  gastric   BIOPSY  09/03/2022   Procedure: BIOPSY;  Surgeon: Harvel Quale, MD;  [facility];  Service: Gastroenterology;;   COLONOSCOPY WITH PROPOFOL N/A 09/03/2022   Procedure: COLONOSCOPY WITH PROPOFOL;  Surgeon: Harvel Quale, MD;  [facility];  Service: Gastroenterology;  Laterality: N/A;  900 ASA 3   ESOPHAGEAL BRUSHING  09/03/2022   Procedure: ESOPHAGEAL BRUSHING;  Surgeon: Montez Morita, Quillian Quince, MD;  [facility];  Service: Gastroenterology;;   ESOPHAGOGASTRODUODENOSCOPY (EGD) WITH PROPOFOL N/A 02/17/2021   Procedure: ESOPHAGOGASTRODUODENOSCOPY (EGD) WITH PROPOFOL;  Surgeon: Harvel Quale, MD;  [facility];  Service: Gastroenterology;  Laterality: N/A;   ESOPHAGOGASTRODUODENOSCOPY (EGD) WITH PROPOFOL N/A 05/04/2022   Procedure: ESOPHAGOGASTRODUODENOSCOPY (EGD) WITH PROPOFOL;  Surgeon: Daneil Dolin, MD;  [facility];  Service: Endoscopy;  Laterality: N/A;   ESOPHAGOGASTRODUODENOSCOPY (EGD) WITH PROPOFOL N/A 06/30/2022   Procedure: ESOPHAGOGASTRODUODENOSCOPY (EGD) WITH PROPOFOL;  Surgeon: Eloise Harman, DO;  [facility];  Service: Endoscopy;  Laterality: N/A;   ESOPHAGOGASTRODUODENOSCOPY (EGD) WITH PROPOFOL N/A 09/03/2022  Procedure: ESOPHAGOGASTRODUODENOSCOPY (EGD) WITH PROPOFOL;  Surgeon: Harvel Quale, MD;  [facility];  Service: Gastroenterology;  Laterality: N/A;   HALLUX VALGUS BASE WEDGE Right 06/09/2015   Procedure: Base wedge osteotomy with modified McBride right foot ;  Surgeon: Sharlotte Alamo, MD;  Location: [facility];  Service: Podiatry;  Laterality: Right;   PORTACATH PLACEMENT Left 06/28/2020   Procedure:  PORT-A-CATHETER PLACEMENT LEFT CHEST (attached catheter in left subclavian);  Surgeon: Virl Cagey, MD;  Location: [facility];  Service: General;  Laterality: Left;   TUBAL LIGATION     VIDEO BRONCHOSCOPY WITH ENDOBRONCHIAL NAVIGATION N/A 03/09/2021   Procedure: VIDEO BRONCHOSCOPY WITH ENDOBRONCHIAL NAVIGATION;  Surgeon: Ottie Glazier, MD;  Location: [facility];  Service: Thoracic;  Laterality: N/A;   VIDEO BRONCHOSCOPY WITH ENDOBRONCHIAL ULTRASOUND N/A 03/09/2021   Procedure: VIDEO BRONCHOSCOPY WITH ENDOBRONCHIAL ULTRASOUND;  Surgeon: Ottie Glazier, MD;  Location: [facility];  Service: Thoracic;  Laterality: N/A;    FAMILY HISTORY: Family History  Problem Relation Age of Onset   Alzheimer's disease Mother    COPD Father    Emphysema Father    Hypertension Father    Healthy Sister    Healthy Brother    Alzheimer's disease Maternal Grandmother    Healthy Sister    Healthy Sister    Healthy Sister    Prostate cancer Other        paternal grandmother's brother; dx in early 81s   Breast cancer Neg Hx     SOCIAL HISTORY:  reports that she quit smoking about 8 years ago. Her smoking use included cigarettes. She has a 60.00 pack-year smoking history. She has never used smokeless tobacco. She reports that she does not drink alcohol and does not use drugs.  PERFORMANCE STATUS: The patient's performance status is 2 - Symptomatic, <50% confined to bed  PHYSICAL EXAM: Most Recent Vital Signs: Blood pressure (!) 144/86, pulse (!) 110, temperature 97.9 F (36.6 C), resp. rate 18, height 5' (1.524 m), weight 115 lb 15.4 oz (52.6 kg), SpO2 93 %. BP (!) 144/86   Pulse (!) 110   Temp 97.9 F (36.6 C)   Resp 18   Ht 5' (1.524 m)   Wt 115 lb 15.4 oz (52.6 kg)   SpO2 93%   BMI 22.65 kg/m  General appearance: alert, cooperative, and appears stated age Lungs:  Bilateral air entry with crepitations at bases. Heart: regular rate and rhythm Abdomen:  Soft, nontender with no palpable  masses. Extremities:  No edema or cyanosis. Neurologic: Grossly normal  LABORATORY DATA:  Results for orders placed or performed during the hospital encounter of 10/03/22 (from the past 48 hour(s))  CBC with Differential/Platelet     Status: Abnormal   Collection Time: 10/06/22  5:09 AM  Result Value Ref Range   WBC 1.1 (LL) 4.0 - 10.5 K/uL    Comment: REPEATED TO VERIFY WHITE COUNT CONFIRMED ON SMEAR THIS CRITICAL RESULT HAS VERIFIED AND BEEN CALLED TO [facility] BY LATISHA HENDERSON ON 11 19 2023 AT [place], AND HAS BEEN READ BACK.     RBC 2.77 (L) 3.87 - 5.11 MIL/uL   Hemoglobin 8.7 (L) 12.0 - 15.0 g/dL   HCT 26.6 (L) 36.0 - 46.0 %   MCV 96.0 80.0 - 100.0 fL   MCH 31.4 26.0 - 34.0 pg   MCHC 32.7 30.0 - 36.0 g/dL   RDW 17.9 (H) 11.5 - 15.5 %   Platelets 69 (L) 150 - 400 K/uL    Comment: SPECIMEN  CHECKED FOR CLOTS REPEATED TO VERIFY PLATELET COUNT CONFIRMED BY SMEAR    nRBC 2.8 (H) 0.0 - 0.2 %   Neutrophils Relative % 31 %   Neutro Abs 0.3 (LL) 1.7 - 7.7 K/uL    Comment: This critical result has verified and been called to [facility] by Levan Hurst on 11 19 2023 at 0709, and has been read back.    Lymphocytes Relative 28 %   Lymphs Abs 0.3 (L) 0.7 - 4.0 K/uL   Monocytes Relative 33 %   Monocytes Absolute 0.4 0.1 - 1.0 K/uL   Eosinophils Relative 1 %   Eosinophils Absolute 0.0 0.0 - 0.5 K/uL   Basophils Relative 1 %   Basophils Absolute 0.0 0.0 - 0.1 K/uL   WBC Morphology See Note     Comment: TOXIC GRANULATION WHITE CELL CONFIRMED BY SMEAR    Smear Review PLATELET COUNT CONFIRMED BY SMEAR    Immature Granulocytes 6 %   Abs Immature Granulocytes 0.06 0.00 - 0.07 K/uL   Polychromasia PRESENT     Comment: Performed at [facility], [address], [place], [place] [address]  CBC with Differential/Platelet     Status: Abnormal   Collection Time: 10/07/22  6:11 AM  Result Value Ref Range   WBC 1.6 (L) 4.0 - 10.5 K/uL   RBC 2.97 (L) 3.87 - 5.11 MIL/uL   Hemoglobin 9.4 (L) 12.0 -  15.0 g/dL   HCT 28.4 (L) 36.0 - 46.0 %   MCV 95.6 80.0 - 100.0 fL   MCH 31.6 26.0 - 34.0 pg   MCHC 33.1 30.0 - 36.0 g/dL   RDW 17.9 (H) 11.5 - 15.5 %   Platelets 86 (L) 150 - 400 K/uL    Comment: SPECIMEN CHECKED FOR CLOTS Immature Platelet Fraction may be clinically indicated, consider ordering this additional test [id number]    nRBC 2.6 (H) 0.0 - 0.2 %   Neutrophils Relative % 16 %   Neutro Abs 0.3 (LL) 1.7 - 7.7 K/uL   Lymphocytes Relative 21 %   Lymphs Abs 0.3 (L) 0.7 - 4.0 K/uL   Monocytes Relative 49 %   Monocytes Absolute 0.8 0.1 - 1.0 K/uL   Eosinophils Relative 1 %   Eosinophils Absolute 0.0 0.0 - 0.5 K/uL   Basophils Relative 2 %   Basophils Absolute 0.0 0.0 - 0.1 K/uL   WBC Morphology MILD LEFT SHIFT (1-5% METAS, OCC MYELO, OCC BANDS)     Comment: TOXIC GRANULATION   RBC Morphology ANISOCYTOSIS    Smear Review PLATELET COUNT CONFIRMED BY SMEAR    Immature Granulocytes 11 %   Abs Immature Granulocytes 0.17 (H) 0.00 - 0.07 K/uL   Polychromasia PRESENT     Comment: Performed at [facility], [address] [place],  [address]  Basic metabolic panel     Status: Abnormal   Collection Time: 10/07/22  6:11 AM  Result Value Ref Range   Sodium 137 135 - 145 mmol/L   Potassium 3.3 (L) 3.5 - 5.1 mmol/L   Chloride 95 (L) 98 - 111 mmol/L   CO2 32 22 - 32 mmol/L   Glucose, Bld 105 (H) 70 - 99 mg/dL    Comment: Glucose reference range applies only to samples taken after fasting for at least 8 hours.   BUN 13 8 - 23 mg/dL   Creatinine, Ser 0.57 0.44 - 1.00 mg/dL   Calcium 8.5 (L) 8.9 - 10.3 mg/dL   GFR, Estimated >60 >60 mL/min    Comment: (  NOTE) Calculated using the CKD-EPI Creatinine Equation (2021)    Anion gap 10 5 - 15    Comment: Performed at [facility], [address], [place], [place] [address]      RADIOGRAPHY: No results found.     ASSESSMENT and PLAN:  1.  Pancytopenia: - This is from myelosuppression from niraparib.  She was taking weight  adjusted dose of 200 mg daily. - Platelet count has improved from 34 at presentation to 86,000 today and has been gradually going up. - Hemoglobin is also stable at 9.4.  She received 1 unit of PRBC during this hospitalization.  Nutritional deficiency work-up was negative. - Total white count has improved to 1.6 but ANC was remaining low at 300.  However monocyte count has increased to 49%, which indirectly predicts neutrophil response. - We will continue G-CSF daily. - If she is feeling her baseline, and afebrile, she may be discharged home after receiving G-CSF tomorrow. - We will follow her in our clinic on 10/14/2022 and repeat her blood counts.  2.  High-grade serous ovarian carcinoma: - She has a CT [facility] scheduled on 12/29/2021 with labs. - She has a follow-up appointment with me on 10/31/2022.  We will continue to hold niraparib until that time. - Based on the findings on the CT scan, will consider further decreasing niraparib dose to 100 mg daily with close monitoring.  3.  Severe COPD: - She continues to be on home oxygen level at 3 L/min via nasal cannula. - Dr. Shyrl Numbers will see her today for further recommendations.  All questions were answered. The patient knows to call the clinic with any problems, questions or concerns. We can certainly see the patient much sooner if necessary.    Derek Jack

## 2022-10-07 NOTE — Progress Notes (Addendum)
PROGRESS NOTE   [name]  LTJ:[id number]    DOB: 09-20-1956    DOA: 10/03/2022  PCP: [name], MD   I have briefly reviewed patients previous medical records in [facility].  Chief Complaint  Patient presents with   Dizziness   Shortness of Breath    Brief Narrative:  66 year old female, PMH of ovarian cancer on oral chemotherapy (Zejula), COPD, chronic respiratory failure with hypoxia on home oxygen 3 L/min, HTN, HLD, presented to the ED due to 2 days history of cough, increasing dyspnea requiring increased off supplemental oxygen with ambulation.  Dizziness and lightheadedness.  Admitted for COPD exacerbation, pancytopenia, symptomatic anemia.  S/p 1 unit PRBC transfusion.  Discussed with hematology/[name].  Course complicated by persistent neutropenia despite Granix and significant DOE.  Consulted pulmonology for dyspnea.   Assessment & Plan:  Principal Problem:   Symptomatic anemia Active Problems:   Essential hypertension   Iron deficiency anemia   Acute on chronic respiratory failure with hypoxia (HCC)   Mixed hyperlipidemia   Ovarian cancer (HCC)   GERD (gastroesophageal reflux disease)   COPD exacerbation (HCC)   Macrocytic anemia   Pancytopenia (HCC)   COPD exacerbation: CTA chest without acute PE or aortic dissection.  Showed emphysema.  No acute airspace disease.  Did show some bronchiectasis.  Continue azithromycin, DuoNebs, incentive spirometry, flutter valve (had not received this until this morning, requested RN to provide), oxygen support and supportive treatment with cough medicines.  Remains on home oxygen 3 L/min via [place] while at rest.  Follows with Dr. Raul Del, Pulmonology in [place] and Dr. [name], Pulmonology in [place].  Some of her worsening breathing may also be due to bronchiectasis flare.  Improvement has been mild and has plateaued since admission.  Still with significant DOE while returning from bathroom which is very short  distance.  Increased Solu-Medrol to 60 mg every 8 hours, added Dulera and using flutter valve.  Ambulate patient.  Added Mucinex.  Ambulated 50 and 60 feet respectively yesterday with significant DOE.  Patient reports not much improvement since yesterday.  Consulted pulmonology.  Chronic respiratory failure with hypoxia: Remains on home level of oxygen 3 L/min via  since admission.  Will need to do formal home O2 eval prior to discharge to determine O2 needs at rest and with activity.  Pancytopenia/symptomatic anemia/iron deficiency anemia/microcytic anemia: Pancytopenia likely related to chemotherapy, consulted hematology for assistance.  Presented with WBC of 1.6, ANC of 0.6, hemoglobin 6.7 and platelets of 34.  No fever or overt bleeding.  Post 1 unit PRBC, hemoglobin up to 9.  Folate 6.2, B12 290.  Continue iron supplements.  FOBT negative.  Neutropenic precautions.  CT head obtained for dizziness was negative for acute findings.  Discussed with [name], hematology on 11/17, reviewed lab results with him, indicated that Zejula can cause pancytopenia, recommended discontinuing Zejula, starting Granix 300 mg subcutaneously daily x2 doses, okay to DC home when [place] >1 K and no platelet transfusions unless platelet count less than 20 K or bleeding.  He will arrange follow-up early next week in office with labs.  Hemoglobin stable, thrombocytopenia improving but still persistently neutropenic with WBC 1.6, ANC worse than on admission at 0.3 >0.3 despite Granix x3 days.  Will communicate with [name] today.  Continue Granix.  Ovarian cancer: As discussed with [name] on 11/17, hold Zejula until outpatient follow-up.  GERD: Continue PPI.  Per family, Protonix reportedly causes chest pain and they prefer to  continue home dose of omeprazole 20 mg daily.  Hypertension: Continue amlodipine.  Controlled.  Mixed hyperlipidemia: Continue Lipitor  Depression: Continue Pamelor  Hypokalemia:  Replace and follow.  Body mass index is 22.65 kg/m.    DVT prophylaxis: SCDs Start: 10/03/22 2145     Code Status: Full Code:  Family Communication: Daughter via phone. Disposition:  Status is: Inpatient.  DC home pending improvement of COPD exacerbation, dyspnea and neutropenia.     Consultants:   Hematology Pulmonology  Procedures:     Antimicrobials:   Azithromycin   Subjective:  Reports not much improvement in DOE.  Same as before, even ambulating to the bathroom in her room and back makes her severely short of breath.  This is worse than her prior home baseline.  Objective:   Vitals:   10/06/22 2109 10/06/22 2144 10/07/22 0535 10/07/22 0712  BP:  136/77 139/83   Pulse:  (!) 102 (!) 103   Resp:  19 19   Temp:  97.8 F (36.6 C) 98.5 F (36.9 C)   TempSrc:  Oral Oral   SpO2: 97% 100% 99% 99%  Weight:      Height:        General exam: Middle-age female, looks older than stated age, small built, frail and chronically ill looking sitting up comfortably in bed. Respiratory system: Improved.  Rhonchi decreased.  Few rhonchi mostly posteriorly.  No crackles.  [place] air entry.  No increased work of breathing. Cardiovascular system: S1 and S2 heard, RRR.  No JVD, murmurs or pedal edema.  Telemetry discontinued. Gastrointestinal system: Abdomen is nondistended, soft and nontender. No organomegaly or masses felt. Normal bowel sounds heard. Central nervous system: Alert and oriented. No focal neurological deficits. Extremities: Symmetric 5 x 5 power. Skin: No rashes, lesions or ulcers Psychiatry: Judgement and insight appear normal. Mood & affect appropriate.     Data Reviewed:   I have personally reviewed following labs and imaging studies   CBC: Recent Labs  Lab 10/05/22 0449 10/06/22 0509 10/07/22 0611  WBC 1.2* 1.1* 1.6*  NEUTROABS 0.7* 0.3* 0.3*  HGB 9.1* 8.7* 9.4*  HCT 27.2* 26.6* 28.4*  MCV 94.1 96.0 95.6  PLT 55* 69* 86*    Basic Metabolic  Panel: Recent Labs  Lab 10/03/22 1817 10/04/22 0326 10/07/22 0611  NA 134* 135 137  K 3.5 3.9 3.3*  CL 93* 97* 95*  CO2 31 29 32  GLUCOSE 117* 135* 105*  BUN '11 9 13  '$ CREATININE 0.66 0.52 0.57  CALCIUM 8.8* 8.4* 8.5*  MG  --  2.1  --   PHOS  --  4.0  --     Liver Function Tests: Recent Labs  Lab 10/03/22 1817 10/04/22 0326  AST 23 19  ALT 13 11  ALKPHOS 89 83  BILITOT 0.4 0.9  PROT 6.5 6.3*  ALBUMIN 3.6 3.3*    CBG: No results for input(s): "GLUCAP" in the last 168 hours.  Microbiology Studies:   Recent Results (from the past 240 hour(s))  Resp Panel by RT-PCR (Flu A&B, Covid) Anterior Nasal Swab     Status: None   Collection Time: 10/03/22  6:23 PM   Specimen: Anterior Nasal Swab  Result Value Ref Range Status   SARS Coronavirus 2 by RT PCR NEGATIVE NEGATIVE Final    Comment: (NOTE) SARS-CoV-2 target nucleic acids are NOT DETECTED.  The SARS-CoV-2 RNA is generally detectable in upper respiratory specimens during the acute phase of infection. The lowest concentration of SARS-CoV-2 viral  copies this assay can detect is 138 copies/mL. A negative result does not preclude SARS-Cov-2 infection and should not be used as the sole basis for treatment or other patient management decisions. A negative result may occur with  improper specimen collection/handling, submission of specimen other than nasopharyngeal swab, presence of viral mutation(s) within the areas targeted by this assay, and inadequate number of viral copies(<138 copies/mL). A negative result must be combined with clinical observations, patient history, and epidemiological information. The expected result is Negative.  Fact Sheet for Patients:  EntrepreneurPulse.com.au  Fact Sheet for Healthcare Providers:  IncredibleEmployment.be  This test is no t yet approved or cleared by the [place] FDA and  has been authorized for detection and/or diagnosis of  SARS-CoV-2 by FDA under an Emergency Use Authorization (EUA). This EUA will remain  in effect (meaning this test can be used) for the duration of the COVID-19 declaration under Section 564(b)(1) of the Act, 21 U.S.C.section 360bbb-3(b)(1), unless the authorization is terminated  or revoked sooner.       Influenza A by PCR NEGATIVE NEGATIVE Final   Influenza B by PCR NEGATIVE NEGATIVE Final    Comment: (NOTE) The Xpert Xpress SARS-CoV-2/FLU/RSV plus assay is intended as an aid in the diagnosis of influenza from Nasopharyngeal swab specimens and should not be used as a sole basis for treatment. Nasal washings and aspirates are unacceptable for Xpert Xpress SARS-CoV-2/FLU/RSV testing.  Fact Sheet for Patients: EntrepreneurPulse.com.au  Fact Sheet for Healthcare Providers: IncredibleEmployment.be  This test is not yet approved or cleared by the [place] FDA and has been authorized for detection and/or diagnosis of SARS-CoV-2 by FDA under an Emergency Use Authorization (EUA). This EUA will remain in effect (meaning this test can be used) for the duration of the COVID-19 declaration under Section 564(b)(1) of the Act, 21 U.S.C. section 360bbb-3(b)(1), unless the authorization is terminated or revoked.  Performed at [facility], [address], [place], [place] [address]     Radiology Studies:  No results found.  Scheduled Meds:    amLODipine  5 mg Oral QHS   atorvastatin  40 mg Oral Daily   azithromycin  250 mg Oral Daily   Chlorhexidine Gluconate Cloth  6 each Topical Daily   dextromethorphan-guaiFENesin  2 tablet Oral BID   ferrous sulfate  325 mg Oral QODAY   ipratropium-albuterol  3 mL Nebulization BID   methylPREDNISolone (SOLU-MEDROL) injection  60 mg Intravenous Q8H   mometasone-formoterol  2 puff Inhalation BID   nortriptyline  25 mg Oral QHS   pantoprazole  40 mg Oral Daily   potassium chloride  40 mEq Oral Once    Tbo-Filgrastim  300 mcg Subcutaneous Q24H    Continuous Infusions:     LOS: 2 days     Vernell Leep, MD,  FACP, FHM, SFHM, [facility], [facility]   [facility] [place]     To contact the attending provider between 7A-7P or the covering provider during after hours 7P-7A, please log into the web site [email or web address] and access using universal [place] password for that web site. If you do not have the password, please call the hospital operator.  10/07/2022, 7:50 AM

## 2022-10-07 NOTE — Consult Note (Signed)
NAME:  [name], MRN:  [id number], DOB:  June 27, 1956, LOS: 2 ADMISSION DATE:  10/03/2022, CONSULTATION DATE:  10/03/22 REFERRING MD:  Lavella Lemons, CHIEF COMPLAINT:  sob     Brief patient profile:  29  yowf  quit smoking 2015 referred to pulmonary clinic in [place]  09/10/2022 by [facility]  for post hosp f/u    Pt of Dr Raul Del with copd GOLD 4  criteria and housebound at baseline/ 02 dep / pred dep at 5 mg with freq exac and "just calls for appt" when flares. Typical flare in seting of increasing cough and congestion x "a few days" and says called for appt with Dr Raul Del but couldn't see her for a few days and then much worse am 9/13 and admit with chest tightness, sob at rest and needed bipap overnight and PCCM consulted am 9/14      History of Present Illness  09/10/2022  Pulmonary/ post admit office eval/ [name] / [place] Office trelegy 100 and pred 5 mg      Chief Complaint  Patient presents with   Consult      HFU from [place] patient sees Pulmonary @ [facility]     Dyspnea:  room to room ok / walks to car 25 ft/ walker /02  3lpm  Cough: rattling / slt green never turned clear most the time it stays that way  Sleep: flat bed with a bunch of pillows  SABA use: avg use now is none but no feel for max / neb even less   rec Breztri 2bid / saba prn with pred 20 mg daily as back up  Levaquin 500 mg daily x 7 days For cough / congestion mucinex 1200 mg q12 h prn  Titrate 02 to keep sats > 90%    10/07/22 Asked to see as inpt admitted 10/03/22    2-day onset of cough and increasing shortness of breath requiring her to increase her supplemental oxygen on ambulation.  Patient states that she could barely walk across the room without being short of breath.  Patient also complained of about a week of lightheadedness and dizziness.  She denies chest pain, fever, chills, nausea, vomiting, abdominal pain.   ED Course:  In the emergency department, she was tachypneic and initial pulse  was 103 bpm and other vital signs were within normal range.  Work-up in the ED showed pancytopenia with H/H 6.7/20.3 (this was 8.3/25.2 on 08/27/2022).  WBC 1.6, MCV 101.0, platelets 34.  BMP showed sodium of 134, potassium 3.4, chloride 93, bicarb 31, glucose 117, BUN 11, creatinine 0.66, BNP 18.0, FOBT negative.  Influenza A, B, SARS coronavirus 2 was negative. CT angiography chest with contrast was negative for acute pulmonary embolus or aortic dissection.  Emphysema.  No acute airspace disease.  Mild bronchiectasis and bronchial wall thickening with scattered clustered centrilobular nodules, stable to recent priors.  No enlarging pulmonary nodules. CT head without contrast showed no acute intracranial abnormality Chest x-ray showed no active cardiopulmonary disease.  COPD Breathing treatment was provided, Solu-Medrol was given, IV hydration was provided.  1 unit of blood was ordered to be transfused in the ED.  Hospitalist was asked to admit patient for further evaluation and management.            Past Medical History:  Diagnosis Date   Anemia     Aortic atherosclerosis (HCC)     Arthritis     Asthma     Bilateral carotid artery stenosis  Cancer ([place])      Gastric Cancer   COPD (chronic obstructive pulmonary disease) (HCC)     High cholesterol     Hyperlipidemia     Hypertension     Neuropathy     Osteoarthritis     Ovarian cancer ([place])     Oxygen dependent     Peripheral vascular disease (HCC)     Peritoneal carcinoma (HCC)     Pneumonia 2019   Port-A-Cath in place 07/03/2020   Pulmonary emphysema (HCC)        Scheduled Meds:  amLODipine  5 mg Oral QHS   atorvastatin  40 mg Oral Daily   Chlorhexidine Gluconate Cloth  6 each Topical Daily   dextromethorphan-guaiFENesin  2 tablet Oral BID   ferrous sulfate  325 mg Oral QODAY   ipratropium-albuterol  3 mL Nebulization BID   methylPREDNISolone (SOLU-MEDROL) injection  60 mg Intravenous Q8H   mometasone-formoterol  2 puff  Inhalation BID   nortriptyline  25 mg Oral QHS   pantoprazole  40 mg Oral Daily   Continuous Infusions: PRN Meds:.acetaminophen **OR** acetaminophen, guaiFENesin-dextromethorphan, ipratropium-albuterol, ondansetron **OR** ondansetron (ZOFRAN) IV        Significant Hospital Events: Including procedures, antibiotic start and stop dates in addition to other pertinent events     Interim History / Subjective:  More short of breath since transfusion/ minimally congested sounding cough   Objective   Blood pressure (!) 144/86, pulse (!) 110, temperature 97.9 F (36.6 C), resp. rate 18, height 5' (1.524 m), weight 52.6 kg, SpO2 93 %.        Intake/Output Summary (Last 24 hours) at 10/07/2022 1603 Last data filed at 10/07/2022 1230 Gross per 24 hour  Intake 600 ml  Output --  Net 600 ml   Filed Weights   10/04/22 0558  Weight: 52.6 kg    Examination: Tmax:  98.5 General appearance:    chronically ill elderly wf mild increased wob at rest   At Rest 02 sats  93% on 3lpm   No jvd Oropharynx clear,  mucosa nl Neck supple Lungs with a few insp crackles R > L base bilaterally RRR no s3 or or sign murmur Abd soft with nl  excursion  Extr warm with no edema or clubbing noted Neuro  Sensorium intact ,  no apparent motor deficits      Assessment & Plan:  1)  acute on chronic resp failure s/p transfusion of PRBC's with baseline BNP nl ? Transfusion reaction ? Vol overload but no evidence of aecopd/ bronchiectasis flare or HCAP at present   Rec continue IV solumedrol and add one dose IV Lasix now/ monitor sats continuously tonight/ check cxr now   2) GOLD IV copd s/p remote smoking cessation with limited ventilatory reserve >>> no change maint rx/ prn saba      Labs   CBC: Recent Labs  Lab 10/03/22 1817 10/04/22 0326 10/05/22 0449 10/06/22 0509 10/07/22 0611  WBC 1.6* 1.0* 1.2* 1.1* 1.6*  NEUTROABS 0.6*  --  0.7* 0.3* 0.3*  HGB 6.7* 9.0* 9.1* 8.7* 9.4*  HCT 20.3*  26.0* 27.2* 26.6* 28.4*  MCV 101.0* 92.5 94.1 96.0 95.6  PLT 34* 34* 55* 69* 86*    Basic Metabolic Panel: Recent Labs  Lab 10/03/22 1817 10/04/22 0326 10/07/22 0611  NA 134* 135 137  K 3.5 3.9 3.3*  CL 93* 97* 95*  CO2 31 29 32  GLUCOSE 117* 135* 105*  BUN '11 9 13  '$ CREATININE  0.66 0.52 0.57  CALCIUM 8.8* 8.4* 8.5*  MG  --  2.1  --   PHOS  --  4.0  --    GFR: Estimated Creatinine Clearance: 51 mL/min (by C-G formula based on SCr of 0.57 mg/dL). Recent Labs  Lab 10/04/22 0326 10/05/22 0449 10/06/22 0509 10/07/22 0611  WBC 1.0* 1.2* 1.1* 1.6*    Liver Function Tests: Recent Labs  Lab 10/03/22 1817 10/04/22 0326  AST 23 19  ALT 13 11  ALKPHOS 89 83  BILITOT 0.4 0.9  PROT 6.5 6.3*  ALBUMIN 3.6 3.3*   No results for input(s): "LIPASE", "AMYLASE" in the last 168 hours. No results for input(s): "AMMONIA" in the last 168 hours.  ABG    Component Value Date/Time   PHART 7.45 07/31/2022 2109   PCO2ART 49 (H) 07/31/2022 2109   PO2ART 82 (L) 07/31/2022 2109   HCO3 33.4 (H) 08/01/2022 0500   ACIDBASEDEF 2.1 (H) 01/18/2016 1250   O2SAT 79.5 08/01/2022 0500     Coagulation Profile: No results for input(s): "INR", "PROTIME" in the last 168 hours.  Cardiac Enzymes: No results for input(s): "CKTOTAL", "CKMB", "CKMBINDEX", "TROPONINI" in the last 168 hours.  HbA1C: Hemoglobin A1C  Date/Time Value Ref Range Status  11/01/2013 05:58 AM 5.6 4.2 - 6.3 % Final    Comment:    The American Diabetes Association recommends that a primary goal of therapy should be <7% and that physicians should reevaluate the treatment regimen in patients with HbA1c values consistently >8%.     CBG: No results for input(s): "GLUCAP" in the last 168 hours.     Past Medical History:  She,  has a past medical history of Anemia, Aortic atherosclerosis ([place]), Arthritis, Asthma, Bilateral carotid artery stenosis, Cancer ([place]), COPD (chronic obstructive pulmonary disease) ([place]), High  cholesterol, Hyperlipidemia, Hypertension, Neuropathy, Osteoarthritis, Ovarian cancer ([place]), Oxygen dependent, Peripheral vascular disease ([place]), Peritoneal carcinoma ([place]), Pneumonia (2019), Port-A-Cath in place (07/03/2020), and Pulmonary emphysema ([place]).   Surgical History:   Past Surgical History:  Procedure Laterality Date   BIOPSY  06/30/2022   Procedure: BIOPSY;  Surgeon: Eloise Harman, DO;  [facility];  Service: Endoscopy;;  gastric   BIOPSY  09/03/2022   Procedure: BIOPSY;  Surgeon: Harvel Quale, MD;  [facility];  Service: Gastroenterology;;   COLONOSCOPY WITH PROPOFOL N/A 09/03/2022   Procedure: COLONOSCOPY WITH PROPOFOL;  Surgeon: Harvel Quale, MD;  [facility];  Service: Gastroenterology;  Laterality: N/A;  900 ASA 3   ESOPHAGEAL BRUSHING  09/03/2022   Procedure: ESOPHAGEAL BRUSHING;  Surgeon: Montez Morita, Quillian Quince, MD;  [facility];  Service: Gastroenterology;;   ESOPHAGOGASTRODUODENOSCOPY (EGD) WITH PROPOFOL N/A 02/17/2021   Procedure: ESOPHAGOGASTRODUODENOSCOPY (EGD) WITH PROPOFOL;  Surgeon: Harvel Quale, MD;  [facility];  Service: Gastroenterology;  Laterality: N/A;   ESOPHAGOGASTRODUODENOSCOPY (EGD) WITH PROPOFOL N/A 05/04/2022   Procedure: ESOPHAGOGASTRODUODENOSCOPY (EGD) WITH PROPOFOL;  Surgeon: Daneil Dolin, MD;  [facility];  Service: Endoscopy;  Laterality: N/A;   ESOPHAGOGASTRODUODENOSCOPY (EGD) WITH PROPOFOL N/A 06/30/2022   Procedure: ESOPHAGOGASTRODUODENOSCOPY (EGD) WITH PROPOFOL;  Surgeon: Eloise Harman, DO;  [facility];  Service: Endoscopy;  Laterality: N/A;   ESOPHAGOGASTRODUODENOSCOPY (EGD) WITH PROPOFOL N/A 09/03/2022   Procedure: ESOPHAGOGASTRODUODENOSCOPY (EGD) WITH PROPOFOL;  Surgeon: Harvel Quale, MD;  [facility];  Service: Gastroenterology;  Laterality: N/A;   HALLUX VALGUS BASE WEDGE Right 06/09/2015    Procedure: Base wedge osteotomy  with modified McBride right foot ;  Surgeon: Sharlotte Alamo, MD;  Location: [facility];  Service: Podiatry;  Laterality: Right;   PORTACATH PLACEMENT Left 06/28/2020   Procedure: PORT-A-CATHETER PLACEMENT LEFT CHEST (attached catheter in left subclavian);  Surgeon: Virl Cagey, MD;  Location: [facility];  Service: General;  Laterality: Left;   TUBAL LIGATION     VIDEO BRONCHOSCOPY WITH ENDOBRONCHIAL NAVIGATION N/A 03/09/2021   Procedure: VIDEO BRONCHOSCOPY WITH ENDOBRONCHIAL NAVIGATION;  Surgeon: Ottie Glazier, MD;  Location: [facility];  Service: Thoracic;  Laterality: N/A;   VIDEO BRONCHOSCOPY WITH ENDOBRONCHIAL ULTRASOUND N/A 03/09/2021   Procedure: VIDEO BRONCHOSCOPY WITH ENDOBRONCHIAL ULTRASOUND;  Surgeon: Ottie Glazier, MD;  Location: [facility];  Service: Thoracic;  Laterality: N/A;     Social History:   reports that she quit smoking about 8 years ago. Her smoking use included cigarettes. She has a 60.00 pack-year smoking history. She has never used smokeless tobacco. She reports that she does not drink alcohol and does not use drugs.   Family History:  Her family history includes Alzheimer's disease in her maternal grandmother and mother; COPD in her father; Emphysema in her father; Healthy in her brother, sister, sister, sister, and sister; Hypertension in her father; Prostate cancer in an other family member. There is no history of Breast cancer.   Allergies No Known Allergies   Home Medications  Prior to Admission medications   Medication Sig Start Date End Date Taking? Authorizing Provider  albuterol (VENTOLIN HFA) 108 (90 Base) MCG/ACT inhaler Inhale 2 puffs into the lungs every 6 (six) hours as needed for wheezing or shortness of breath. 07/01/22  Yes Emokpae, Courage, MD  amLODipine (NORVASC) 5 MG tablet Take 5 mg by mouth at bedtime.    Yes [provider]  aspirin EC 81 MG tablet Take 1 tablet (81 mg total) by mouth daily with breakfast.  07/01/22  Yes Emokpae, Courage, MD  atorvastatin (LIPITOR) 40 MG tablet Take 1 tablet (40 mg total) by mouth daily. 01/02/22  Yes Dessa Phi, DO  Budeson-Glycopyrrol-Formoterol (BREZTRI AEROSPHERE) 160-9-4.8 MCG/ACT AERO Inhale 2 puffs into the lungs 2 (two) times daily. 09/10/22  Yes Tanda Rockers, MD  ferrous sulfate 325 (65 FE) MG tablet Take 325 mg by mouth every other day.   Yes [provider]  ipratropium (ATROVENT HFA) 17 MCG/ACT inhaler Inhale 2 puffs into the lungs every 6 (six) hours as needed for wheezing.   Yes [provider]  levalbuterol (XOPENEX) 1.25 MG/3ML nebulizer solution Take 1.25 mg by nebulization every 6 (six) hours as needed for shortness of breath or wheezing. 09/28/21  Yes [provider]  magnesium oxide (MAG-OX) 400 (240 Mg) MG tablet Take 1 tablet (400 mg total) by mouth 2 (two) times daily. 09/06/22  Yes Derek Jack, MD  niraparib tosylate (ZEJULA) 200 MG tablet Take 1 tablet by mouth daily. 06/25/22  Yes Derek Jack, MD  nortriptyline (PAMELOR) 25 MG capsule Take 25 mg by mouth at bedtime.   Yes [provider]  OXYGEN Inhale 3 L into the lungs continuous.   Yes [provider]  pantoprazole (PROTONIX) 40 MG tablet Take 1 tablet (40 mg total) by mouth 2 (two) times daily. 07/01/22 10/03/22 Yes Emokpae, Courage, MD  predniSONE (DELTASONE) 5 MG tablet Take 5 mg by mouth daily with breakfast.   Yes [provider]  primidone (MYSOLINE) 50 MG tablet Take 50 mg by mouth at bedtime. 01/30/22  Yes [provider]  Donnal Debar 100-62.5-25 MCG/ACT  AEPB Inhale 1 puff into the lungs daily.   Yes [provider]  gabapentin (NEURONTIN) 600 MG tablet Take 1 tablet by mouth 3 (three) times daily. Patient not taking: Reported on 10/03/2022 09/23/22   [provider]  magic mouthwash SOLN Take 10 mLs by mouth 3 (three) times daily as needed (sore throat). Patient not taking:  Reported on 10/03/2022 08/26/22   Harvel Quale, MD  nystatin (MYCOSTATIN) 100000 UNIT/ML suspension Take by mouth. Patient not taking: Reported on 10/03/2022 09/24/22   [provider]      Christinia Gully, MD Pulmonary and [place] [phone]   After 7:00 pm call Elink  [phone]

## 2022-10-07 NOTE — Progress Notes (Signed)
Pharmacy Antibiotic Note  [name] is a 66 y.o. female admitted on 10/03/2022 with pneumonia.  Pharmacy has been consulted for Zosyn dosing.  Plan: Zosyn 3.375g IV q8h (4 hour infusion). Monitor renal function, clinical status and C&S  Height: 5' (152.4 cm) Weight: 52.6 kg (115 lb 15.4 oz) IBW/kg (Calculated) : 45.5  Temp (24hrs), Avg:98.1 F (36.7 C), Min:97.8 F (36.6 C), Max:98.5 F (36.9 C)  Recent Labs  Lab 10/03/22 1817 10/04/22 0326 10/05/22 0449 10/06/22 0509 10/07/22 0611  WBC 1.6* 1.0* 1.2* 1.1* 1.6*  CREATININE 0.66 0.52  --   --  0.57    Estimated Creatinine Clearance: 51 mL/min (by C-G formula based on SCr of 0.57 mg/dL).    No Known Allergies  Antimicrobials this admission: Zosyn 11/20 >>   Thank you for allowing pharmacy to be a part of this patient's care.  Alanda Slim, PharmD, [facility] Clinical Pharmacist Please see AMION for all Pharmacists' Contact Phone Numbers 10/07/2022, 7:24 PM

## 2022-10-08 DIAGNOSIS — E876 Hypokalemia: Secondary | ICD-10-CM | POA: Diagnosis not present

## 2022-10-08 DIAGNOSIS — J189 Pneumonia, unspecified organism: Secondary | ICD-10-CM

## 2022-10-08 DIAGNOSIS — D61818 Other pancytopenia: Secondary | ICD-10-CM | POA: Diagnosis not present

## 2022-10-08 DIAGNOSIS — J441 Chronic obstructive pulmonary disease with (acute) exacerbation: Secondary | ICD-10-CM | POA: Diagnosis not present

## 2022-10-08 LAB — CBC WITH DIFFERENTIAL/PLATELET
Abs Immature Granulocytes: 0.2 10*3/uL — ABNORMAL HIGH (ref 0.00–0.07)
Band Neutrophils: 1 %
Basophils Absolute: 0 10*3/uL (ref 0.0–0.1)
Basophils Relative: 0 %
Eosinophils Absolute: 0 10*3/uL (ref 0.0–0.5)
Eosinophils Relative: 0 %
HCT: 29 % — ABNORMAL LOW (ref 36.0–46.0)
Hemoglobin: 9.6 g/dL — ABNORMAL LOW (ref 12.0–15.0)
Lymphocytes Relative: 31 %
Lymphs Abs: 2 10*3/uL (ref 0.7–4.0)
MCH: 32.2 pg (ref 26.0–34.0)
MCHC: 33.1 g/dL (ref 30.0–36.0)
MCV: 97.3 fL (ref 80.0–100.0)
Metamyelocytes Relative: 2 %
Monocytes Absolute: 2.4 10*3/uL — ABNORMAL HIGH (ref 0.1–1.0)
Monocytes Relative: 37 %
Myelocytes: 1 %
Neutro Abs: 1.9 10*3/uL (ref 1.7–7.7)
Neutrophils Relative %: 28 %
Platelets: 102 10*3/uL — ABNORMAL LOW (ref 150–400)
RBC: 2.98 MIL/uL — ABNORMAL LOW (ref 3.87–5.11)
RDW: 18 % — ABNORMAL HIGH (ref 11.5–15.5)
WBC: 6.4 10*3/uL (ref 4.0–10.5)
nRBC: 1.7 % — ABNORMAL HIGH (ref 0.0–0.2)

## 2022-10-08 LAB — BASIC METABOLIC PANEL
Anion gap: 11 (ref 5–15)
BUN: 16 mg/dL (ref 8–23)
CO2: 31 mmol/L (ref 22–32)
Calcium: 8.6 mg/dL — ABNORMAL LOW (ref 8.9–10.3)
Chloride: 91 mmol/L — ABNORMAL LOW (ref 98–111)
Creatinine, Ser: 0.65 mg/dL (ref 0.44–1.00)
GFR, Estimated: >60 mL/min (ref >60)
Glucose, Bld: 126 mg/dL — ABNORMAL HIGH (ref 70–99)
Potassium: 3.8 mmol/L (ref 3.5–5.1)
Sodium: 133 mmol/L — ABNORMAL LOW (ref 135–145)

## 2022-10-08 LAB — MAGNESIUM: Magnesium: 1.9 mg/dL (ref 1.7–2.4)

## 2022-10-08 LAB — PROCALCITONIN: Procalcitonin: 0.16 ng/mL

## 2022-10-08 LAB — SEDIMENTATION RATE: Sed Rate: 20 mm/hr (ref 0–22)

## 2022-10-08 NOTE — Progress Notes (Signed)
PROGRESS NOTE   [name]  SPQ:[id number]    DOB: 1956-08-30    DOA: 10/03/2022  PCP: [name], MD   I have briefly reviewed patients previous medical records in [facility].  Chief Complaint  Patient presents with   Dizziness   Shortness of Breath    Brief Narrative:  66 year old female, PMH of ovarian cancer on oral chemotherapy (Zejula), COPD, chronic respiratory failure with hypoxia on home oxygen 3 L/min, HTN, HLD, presented to the ED due to 2 days history of cough, increasing dyspnea requiring increased off supplemental oxygen with ambulation.  Dizziness and lightheadedness.  Admitted for COPD exacerbation, pancytopenia, symptomatic anemia.  S/p 1 unit PRBC transfusion.  Discussed with hematology/[name].  Course complicated by persistent neutropenia despite Granix and significant DOE.  Consulted pulmonology for dyspnea.  Neutropenia resolved.  Now treating for pneumonia.   Assessment & Plan:  Principal Problem:   Symptomatic anemia Active Problems:   Essential hypertension   Iron deficiency anemia   Acute on chronic respiratory failure with hypoxia (HCC)   Mixed hyperlipidemia   Ovarian cancer (HCC)   GERD (gastroesophageal reflux disease)   COPD exacerbation (HCC)   Macrocytic anemia   Pancytopenia (HCC)   COPD exacerbation/Gold 4 COPD: CTA chest without acute PE or aortic dissection.  Showed emphysema.  No acute airspace disease.  Did show some bronchiectasis.  Continue azithromycin, DuoNebs, incentive spirometry, flutter valve (had not received this until this morning, requested RN to provide), oxygen support and supportive treatment with cough medicines.  Remains on home oxygen 3 L/min via [place] while at rest.  Follows with Dr. Raul Del, Pulmonology in [place] and Dr. [name], Pulmonology in [place].  Some of her worsening breathing may also be due to bronchiectasis flare.  Improvement had been mild and plateaued since admission with ongoing  significant DOE with minimal activity.  This continued despite increasing dose of Solu-Medrol, adding Dulera and flutter valve.  Pulmonology consulted and suspect pneumonia on repeat chest x-ray and have started IV Zosyn 11/20.  Still without much improvement in symptoms.  If continues without improvement, may need to consider palliative care input to address goals of care.  Pneumonia: As per pulmonology 11/20, they noted chest x-ray with new ASD LLL posterior segment inpatient with neutropenia and underlying bronchiectasis and so started IV Zosyn per pharmacy.  Procalcitonin 0.16.  ESR 20.  Chronic respiratory failure with hypoxia: Remains on home level of oxygen 3 L/min via [place] since admission.  Will need to do formal home O2 eval prior to discharge to determine O2 needs at rest and with activity.  Treating COPD exacerbation and pneumonia as above.  S/p a dose of IV Lasix by pulmonology on 11/20.  Pancytopenia/symptomatic anemia/iron deficiency anemia/microcytic anemia: Pancytopenia likely related to chemotherapy, consulted hematology for assistance.  Presented with WBC of 1.6, ANC of 0.6, hemoglobin 6.7 and platelets of 34.  No fever or overt bleeding.  Post 1 unit PRBC, hemoglobin up to 9.  Folate 6.2, B12 290.  Continue iron supplements.  FOBT negative.  Neutropenic precautions.  CT head obtained for dizziness was negative for acute findings.  Hematology was consulted.  Zejula which can cause pancytopenia was held.  Treated with Granix 300 mg subcutaneously daily x 4 doses.  Leukopenia and neutropenia have resolved.  Hemoglobin stable in the 9 g range.  Thrombocytopenia improving. As discussed with [name] on 11/21, okay to discontinue Granix, can be discharged from his standpoint, continue to hold Zejula at  discharge and she can follow-up with him in December after her CT scans and labs.  Ovarian cancer: Hematology consulted.  Management as noted above. Hold Zejula at DC until outpatient  follow-up with hematology.  GERD: Continue PPI.  Per family, Protonix reportedly causes chest pain and they prefer to continue home dose of omeprazole 20 mg daily.  Hypertension: Continue amlodipine.  Controlled.  Mixed hyperlipidemia: Continue Lipitor  Depression: Continue Pamelor  Hypokalemia: Replaced.  Magnesium 1.9.  Body mass index is 22.65 kg/m.    DVT prophylaxis: SCDs Start: 10/03/22 2145     Code Status: Full Code:  Family Communication: Daughter via phone on 11/20. Disposition:  Status is: Inpatient.  DC home pending improvement of COPD exacerbation, dyspnea     Consultants:   Hematology Pulmonology  Procedures:     Antimicrobials:   Azithromycin   Subjective:  This morning stated that her DOE was even worse yesterday than the days prior.  She had not ambulated out of bed this morning.  Minimal dry cough.  No chest pain.  No dyspnea at rest.  No other complaints reported.  Objective:   Vitals:   10/07/22 2112 10/08/22 0438 10/08/22 0716 10/08/22 1441  BP: (!) 148/84 130/70  (!) 134/93  Pulse: 89 99  72  Resp: _0 Temp: 98.3 F (36.8 C) 98.3 F (36.8 C)  98.2 F (36.8 C)  TempSrc:    Oral  SpO2: 97% 96% 97% 96%  Weight:      Height:        General exam: Middle-age female, looks older than stated age, small built, frail and chronically ill looking sitting up comfortably in bed.  Appears to be in good spirits. Respiratory system: No significant change in lung findings compared to yesterday.  Occasional basal crackles but otherwise distant breath sounds but clear to auscultation without wheezing or rhonchi.  No increased work of breathing.  Able to speak in full sentences. Cardiovascular system: S1 and S2 heard, RRR.  No JVD, murmurs or pedal edema.  Telemetry discontinued. Gastrointestinal system: Abdomen is nondistended, soft and nontender. No organomegaly or masses felt. Normal bowel sounds heard. Central nervous system: Alert and oriented.  No focal neurological deficits. Extremities: Symmetric 5 x 5 power. Skin: No rashes, lesions or ulcers Psychiatry: Judgement and insight appear normal. Mood & affect appropriate.     Data Reviewed:   I have personally reviewed following labs and imaging studies   CBC: Recent Labs  Lab 10/06/22 0509 10/07/22 0611 10/08/22 0325  WBC 1.1* 1.6* 6.4  NEUTROABS 0.3* 0.3* 1.9  HGB 8.7* 9.4* 9.6*  HCT 26.6* 28.4* 29.0*  MCV 96.0 95.6 97.3  PLT 69* 86* 102*    Basic Metabolic Panel: Recent Labs  Lab 10/03/22 1817 10/04/22 0326 10/07/22 0611 10/08/22 0325  NA 134* 135 137 133*  K 3.5 3.9 3.3* 3.8  CL 93* 97* 95* 91*  CO2 31 29 32 31  GLUCOSE 117* 135* 105* 126*  BUN _1 CREATININE 0.66 0.52 0.57 0.65  CALCIUM 8.8* 8.4* 8.5* 8.6*  MG  --  2.1  --  1.9  PHOS  --  4.0  --   --     Liver Function Tests: Recent Labs  Lab 10/03/22 1817 10/04/22 0326  AST 23 19  ALT 13 11  ALKPHOS 89 83  BILITOT 0.4 0.9  PROT 6.5 6.3*  ALBUMIN 3.6 3.3*    CBG: No results for input(s): "GLUCAP" in  the last 168 hours.  Microbiology Studies:   Recent Results (from the past 240 hour(s))  Resp Panel by RT-PCR (Flu A&B, Covid) Anterior Nasal Swab     Status: None   Collection Time: 10/03/22  6:23 PM   Specimen: Anterior Nasal Swab  Result Value Ref Range Status   SARS Coronavirus 2 by RT PCR NEGATIVE NEGATIVE Final    Comment: (NOTE) SARS-CoV-2 target nucleic acids are NOT DETECTED.  The SARS-CoV-2 RNA is generally detectable in upper respiratory specimens during the acute phase of infection. The lowest concentration of SARS-CoV-2 viral copies this assay can detect is 138 copies/mL. A negative result does not preclude SARS-Cov-2 infection and should not be used as the sole basis for treatment or other patient management decisions. A negative result may occur with  improper specimen collection/handling, submission of specimen other than nasopharyngeal swab, presence of  viral mutation(s) within the areas targeted by this assay, and inadequate number of viral copies(<138 copies/mL). A negative result must be combined with clinical observations, patient history, and epidemiological information. The expected result is Negative.  Fact Sheet for Patients:  EntrepreneurPulse.com.au  Fact Sheet for Healthcare Providers:  IncredibleEmployment.be  This test is no t yet approved or cleared by the [place] FDA and  has been authorized for detection and/or diagnosis of SARS-CoV-2 by FDA under an Emergency Use Authorization (EUA). This EUA will remain  in effect (meaning this test can be used) for the duration of the COVID-19 declaration under Section 564(b)(1) of the Act, 21 U.S.C.section 360bbb-3(b)(1), unless the authorization is terminated  or revoked sooner.       Influenza A by PCR NEGATIVE NEGATIVE Final   Influenza B by PCR NEGATIVE NEGATIVE Final    Comment: (NOTE) The Xpert Xpress SARS-CoV-2/FLU/RSV plus assay is intended as an aid in the diagnosis of influenza from Nasopharyngeal swab specimens and should not be used as a sole basis for treatment. Nasal washings and aspirates are unacceptable for Xpert Xpress SARS-CoV-2/FLU/RSV testing.  Fact Sheet for Patients: EntrepreneurPulse.com.au  Fact Sheet for Healthcare Providers: IncredibleEmployment.be  This test is not yet approved or cleared by the [place] FDA and has been authorized for detection and/or diagnosis of SARS-CoV-2 by FDA under an Emergency Use Authorization (EUA). This EUA will remain in effect (meaning this test can be used) for the duration of the COVID-19 declaration under Section 564(b)(1) of the Act, 21 U.S.C. section 360bbb-3(b)(1), unless the authorization is terminated or revoked.  Performed at [facility], [address] [place], [place] [address]     Radiology Studies:  [facility] Chest [facility]  1 View  Result Date: 10/07/2022 CLINICAL DATA:  Acute respiratory failure with hypoxemia. Shortness of breath. EXAM: PORTABLE CHEST 1 VIEW COMPARISON:  Radiograph and CT 10/03/2022 FINDINGS: Left chest port remains in place. Emphysema with upper lobe scarring. No acute airspace disease. Stable heart size and mediastinal contours. No pleural fluid or pneumothorax. Presumed telemetry lead on the right. IMPRESSION: 1. No acute abnormality. 2. Emphysema with upper lobe scarring, unchanged from recent chest CT. Electronically Signed   By: Keith Rake M.D.   On: 10/07/2022 19:47    Scheduled Meds:    amLODipine  5 mg Oral QHS   atorvastatin  40 mg Oral Daily   Chlorhexidine Gluconate Cloth  6 each Topical Daily   dextromethorphan-guaiFENesin  2 tablet Oral BID   ferrous sulfate  325 mg Oral QODAY   ipratropium-albuterol  3 mL Nebulization BID   methylPREDNISolone (SOLU-MEDROL) injection  60 mg Intravenous Q8H   mometasone-formoterol  2 puff Inhalation BID   nortriptyline  25 mg Oral QHS   pantoprazole  40 mg Oral Daily    Continuous Infusions:    piperacillin-tazobactam (ZOSYN)  IV 3.375 g (10/08/22 1402)     LOS: 3 days     Vernell Leep, MD,  FACP, FHM, SFHM, [facility], [place]     To contact the attending provider between 7A-7P or the covering provider during after hours 7P-7A, please log into the web site [email or web address] and access using universal [place] password for that web site. If you do not have the password, please call the hospital operator.  10/08/2022, 4:57 PM

## 2022-10-08 NOTE — Progress Notes (Signed)
Patient OOB in hallway walked about 50 feet and O2 dropped to 88% on 3L O2. Patient back in bed put on 4L back up to 94%. Placed patient back on her regular 3L O2 97% resting in bed. Patient easily SOB with activity.

## 2022-10-09 ENCOUNTER — Other Ambulatory Visit: Payer: Self-pay

## 2022-10-09 ENCOUNTER — Other Ambulatory Visit (HOSPITAL_COMMUNITY): Payer: Self-pay

## 2022-10-09 DIAGNOSIS — D649 Anemia, unspecified: Secondary | ICD-10-CM | POA: Diagnosis not present

## 2022-10-09 DIAGNOSIS — C569 Malignant neoplasm of unspecified ovary: Secondary | ICD-10-CM

## 2022-10-09 LAB — CBC WITH DIFFERENTIAL/PLATELET
Abs Immature Granulocytes: 0.17 10*3/uL — ABNORMAL HIGH (ref 0.00–0.07)
Abs Immature Granulocytes: 0.8 10*3/uL — ABNORMAL HIGH (ref 0.00–0.07)
Band Neutrophils: 1 %
Basophils Absolute: 0 10*3/uL (ref 0.0–0.1)
Basophils Absolute: 0 10*3/uL (ref 0.0–0.1)
Basophils Relative: 2 %
Basophils Relative: 0 %
Eosinophils Absolute: 0 10*3/uL (ref 0.0–0.5)
Eosinophils Absolute: 0 10*3/uL (ref 0.0–0.5)
Eosinophils Relative: 1 %
Eosinophils Relative: 0 %
HCT: 28.4 % — ABNORMAL LOW (ref 36.0–46.0)
HCT: 28.7 % — ABNORMAL LOW (ref 36.0–46.0)
Hemoglobin: 9.4 g/dL — ABNORMAL LOW (ref 12.0–15.0)
Hemoglobin: 9.6 g/dL — ABNORMAL LOW (ref 12.0–15.0)
Immature Granulocytes: 11 %
Lymphocytes Relative: 21 %
Lymphocytes Relative: 10 %
Lymphs Abs: 0.3 10*3/uL — ABNORMAL LOW (ref 0.7–4.0)
Lymphs Abs: 1.1 10*3/uL (ref 0.7–4.0)
MCH: 31.6 pg (ref 26.0–34.0)
MCH: 32.8 pg (ref 26.0–34.0)
MCHC: 33.1 g/dL (ref 30.0–36.0)
MCHC: 33.4 g/dL (ref 30.0–36.0)
MCV: 95.6 fL (ref 80.0–100.0)
MCV: 98 fL (ref 80.0–100.0)
Metamyelocytes Relative: 3 %
Monocytes Absolute: 0.8 10*3/uL (ref 0.1–1.0)
Monocytes Absolute: 1.4 10*3/uL — ABNORMAL HIGH (ref 0.1–1.0)
Monocytes Relative: 49 %
Monocytes Relative: 12 %
Myelocytes: 3 %
Neutro Abs: 0.3 10*3/uL — CL (ref 1.7–7.7)
Neutro Abs: 8 10*3/uL — ABNORMAL HIGH (ref 1.7–7.7)
Neutrophils Relative %: 16 %
Neutrophils Relative %: 70 %
Platelets: 86 10*3/uL — ABNORMAL LOW (ref 150–400)
Platelets: 108 10*3/uL — ABNORMAL LOW (ref 150–400)
Promyelocytes Relative: 1 %
RBC: 2.97 MIL/uL — ABNORMAL LOW (ref 3.87–5.11)
RBC: 2.93 MIL/uL — ABNORMAL LOW (ref 3.87–5.11)
RDW: 17.9 % — ABNORMAL HIGH (ref 11.5–15.5)
RDW: 18.1 % — ABNORMAL HIGH (ref 11.5–15.5)
WBC: 1.6 10*3/uL — ABNORMAL LOW (ref 4.0–10.5)
WBC: 11.3 10*3/uL — ABNORMAL HIGH (ref 4.0–10.5)
nRBC: 2.6 % — ABNORMAL HIGH (ref 0.0–0.2)
nRBC: 0.6 % — ABNORMAL HIGH (ref 0.0–0.2)

## 2022-10-09 LAB — BASIC METABOLIC PANEL
Anion gap: 11 (ref 5–15)
BUN: 18 mg/dL (ref 8–23)
CO2: 32 mmol/L (ref 22–32)
Calcium: 8.2 mg/dL — ABNORMAL LOW (ref 8.9–10.3)
Chloride: 90 mmol/L — ABNORMAL LOW (ref 98–111)
Creatinine, Ser: 0.71 mg/dL (ref 0.44–1.00)
GFR, Estimated: >60 mL/min (ref >60)
Glucose, Bld: 125 mg/dL — ABNORMAL HIGH (ref 70–99)
Potassium: 3.1 mmol/L — ABNORMAL LOW (ref 3.5–5.1)
Sodium: 133 mmol/L — ABNORMAL LOW (ref 135–145)

## 2022-10-09 LAB — PROCALCITONIN: Procalcitonin: 0.19 ng/mL

## 2022-10-09 MED ORDER — METHYLPREDNISOLONE SODIUM SUCC 40 MG IJ SOLR
40.0000 mg | Freq: Two times a day (BID) | INTRAMUSCULAR | Status: DC
  Administered 2022-10-10: 40 mg via INTRAVENOUS
  Filled 2022-10-09: qty 1

## 2022-10-09 MED ORDER — PREDNISONE 20 MG PO TABS: 40.0000 mg | ORAL_TABLET | Freq: Every day | ORAL | 0 refills | Status: AC

## 2022-10-09 MED ORDER — POTASSIUM CHLORIDE CRYS ER 20 MEQ PO TBCR
40.0000 meq | EXTENDED_RELEASE_TABLET | Freq: Two times a day (BID) | ORAL | Status: DC
  Administered 2022-10-09 – 2022-10-10 (×3): 40 meq via ORAL
  Filled 2022-10-09 (×3): qty 2

## 2022-10-09 MED ORDER — AMOXICILLIN-POT CLAVULANATE 875-125 MG PO TABS: 1.0000 | ORAL_TABLET | Freq: Two times a day (BID) | ORAL | 0 refills | Status: AC

## 2022-10-09 NOTE — Progress Notes (Signed)
PROGRESS NOTE    [name]  ZOX:[id number] DOB: October 25, 1956 DOA: 10/03/2022 PCP: [name], MD    Brief Narrative:  66 year old with history of ovarian cancer on oral chemotherapy with Zeluja , COPD and chronic hypoxemic failure on 3 L oxygen at home, hypertension hyperlipidemia presented to the emergency room with 2 days history of cough, increasing dyspnea requiring more oxygen at home, dizziness and lightheadedness.  She was admitted for COPD exacerbation, pancytopenia, symptomatic anemia.  Seen and followed by pulmonary, oncology.   Assessment & Plan:   COPD with acute exacerbation: Acute on chronic hypoxemic respiratory failure Left lower lobe pneumonia CT angiogram of the chest without pulmonary embolism or aortic dissection.  It showed emphysema and bronchiectasis.   Continue aggressive bronchodilator therapy, IV steroids, inhalational steroids, scheduled and as needed bronchodilators, deep breathing exercises, incentive spirometry, chest physiotherapy. Patient was started on Zosyn due to severity of illness and given severe pancytopenia. Supplemental oxygen to keep saturations more than 92%. Mobilize in the hallway. On very high dose of Solu-Medrol, will gradually taper off.  Pancytopenia, symptomatic anemia, anemia of chronic disease: Presentation hemoglobin 6.7-monitor PRBC with appropriate response.  W11 and folic acid adequate.  FOBT negative. Presentation Wbc of 1.6, ANC 0.6 and platelets 34,000-remains on Granix with adequate improvement.  Discontinued.  Continue to hold chemotherapy on discharge.  Ovarian cancer: Oncology office to schedule follow-up.  GERD: PPI.  Essential hypertension blood pressure stable on amlodipine.  Continue.  Hypokalemia: Replace further.   DVT prophylaxis: SCDs Start: 10/03/22 2145   Code Status: Full code Family Communication: None at the bedside Disposition Plan: Status is: Inpatient Remains inpatient appropriate because:  Significantly short of breath     Consultants:  Oncology Pulmonary  Procedures:  None  Antimicrobials:  Azithromycin 11/16--11/19 Zosyn 11/20---   Subjective: Patient seen and examined.  No complaints at rest.  Gets excessively short of breath on mobility.  Dry cough.  Minimal wheezing.  Anxious with thoughts about going home.  Afebrile.  On 3 L oxygen at rest.  Objective: Vitals:   10/08/22 2039 10/09/22 0414 10/09/22 0723 10/09/22 1405  BP: 138/81 (!) 141/83  (!) 143/64  Pulse: (!) 104 95  98  Resp: '14 14  20  '$ Temp: 98.9 F (37.2 C) 97.7 F (36.5 C)  98.5 F (36.9 C)  TempSrc:  Oral  Oral  SpO2: 98% 99% 99% 93%  Weight:      Height:        Intake/Output Summary (Last 24 hours) at 10/09/2022 1414 Last data filed at 10/09/2022 0920 Gross per 24 hour  Intake 750 ml  Output --  Net 750 ml   Filed Weights   10/04/22 0558  Weight: 52.6 kg    Examination:  General exam: Appears calm and comfortable  Frail and debilitated.  Not in any distress. Respiratory system: Mostly clear.  She does have expiratory wheezes on the lower back. Cardiovascular system: S1 & S2 heard, RRR.  No pedal edema. Gastrointestinal system: Abdomen is nondistended, soft and nontender. No organomegaly or masses felt. Normal bowel sounds heard. Central nervous system: Alert and oriented. No focal neurological deficits.    Data Reviewed: I have personally reviewed following labs and imaging studies  CBC: Recent Labs  Lab 10/05/22 0449 10/06/22 0509 10/07/22 0611 10/08/22 0325 10/09/22 0257  WBC 1.2* 1.1* 1.6* 6.4 11.3*  NEUTROABS 0.7* 0.3* 0.3* 1.9 8.0*  HGB 9.1* 8.7* 9.4* 9.6* 9.6*  HCT 27.2* 26.6* 28.4* 29.0* 28.7*  MCV  94.1 96.0 95.6 97.3 98.0  PLT 55* 69* 86* 102* 297*   Basic Metabolic Panel: Recent Labs  Lab 10/03/22 1817 10/04/22 0326 10/07/22 0611 10/08/22 0325 10/09/22 0257  NA 134* 135 137 133* 133*  K 3.5 3.9 3.3* 3.8 3.1*  CL 93* 97* 95* 91* 90*  CO2 31 29  32 31 32  GLUCOSE 117* 135* 105* 126* 125*  BUN '11 9 13 16 18  '$ CREATININE 0.66 0.52 0.57 0.65 0.71  CALCIUM 8.8* 8.4* 8.5* 8.6* 8.2*  MG  --  2.1  --  1.9  --   PHOS  --  4.0  --   --   --    GFR: Estimated Creatinine Clearance: 51 mL/min (by C-G formula based on SCr of 0.71 mg/dL). Liver Function Tests: Recent Labs  Lab 10/03/22 1817 10/04/22 0326  AST 23 19  ALT 13 11  ALKPHOS 89 83  BILITOT 0.4 0.9  PROT 6.5 6.3*  ALBUMIN 3.6 3.3*   No results for input(s): "LIPASE", "AMYLASE" in the last 168 hours. No results for input(s): "AMMONIA" in the last 168 hours. Coagulation Profile: No results for input(s): "INR", "PROTIME" in the last 168 hours. Cardiac Enzymes: No results for input(s): "CKTOTAL", "CKMB", "CKMBINDEX", "TROPONINI" in the last 168 hours. BNP (last 3 results) No results for input(s): "PROBNP" in the last 8760 hours. HbA1C: No results for input(s): "HGBA1C" in the last 72 hours. CBG: No results for input(s): "GLUCAP" in the last 168 hours. Lipid Profile: No results for input(s): "CHOL", "HDL", "LDLCALC", "TRIG", "CHOLHDL", "LDLDIRECT" in the last 72 hours. Thyroid Function Tests: No results for input(s): "TSH", "T4TOTAL", "FREET4", "T3FREE", "THYROIDAB" in the last 72 hours. Anemia Panel: No results for input(s): "VITAMINB12", "FOLATE", "FERRITIN", "TIBC", "IRON", "RETICCTPCT" in the last 72 hours. Sepsis Labs: Recent Labs  Lab 10/08/22 0325 10/09/22 0257  PROCALCITON 0.16 0.19    Recent Results (from the past 240 hour(s))  Resp Panel by RT-PCR (Flu A&B, Covid) Anterior Nasal Swab     Status: None   Collection Time: 10/03/22  6:23 PM   Specimen: Anterior Nasal Swab  Result Value Ref Range Status   SARS Coronavirus 2 by RT PCR NEGATIVE NEGATIVE Final    Comment: (NOTE) SARS-CoV-2 target nucleic acids are NOT DETECTED.  The SARS-CoV-2 RNA is generally detectable in upper respiratory specimens during the acute phase of infection. The  lowest concentration of SARS-CoV-2 viral copies this assay can detect is 138 copies/mL. A negative result does not preclude SARS-Cov-2 infection and should not be used as the sole basis for treatment or other patient management decisions. A negative result may occur with  improper specimen collection/handling, submission of specimen other than nasopharyngeal swab, presence of viral mutation(s) within the areas targeted by this assay, and inadequate number of viral copies(<138 copies/mL). A negative result must be combined with clinical observations, patient history, and epidemiological information. The expected result is Negative.  Fact Sheet for Patients:  EntrepreneurPulse.com.au  Fact Sheet for Healthcare Providers:  IncredibleEmployment.be  This test is no t yet approved or cleared by the [place] FDA and  has been authorized for detection and/or diagnosis of SARS-CoV-2 by FDA under an Emergency Use Authorization (EUA). This EUA will remain  in effect (meaning this test can be used) for the duration of the COVID-19 declaration under Section 564(b)(1) of the Act, 21 U.S.C.section 360bbb-3(b)(1), unless the authorization is terminated  or revoked sooner.       Influenza A by PCR NEGATIVE NEGATIVE Final  Influenza B by PCR NEGATIVE NEGATIVE Final    Comment: (NOTE) The Xpert Xpress SARS-CoV-2/FLU/RSV plus assay is intended as an aid in the diagnosis of influenza from Nasopharyngeal swab specimens and should not be used as a sole basis for treatment. Nasal washings and aspirates are unacceptable for Xpert Xpress SARS-CoV-2/FLU/RSV testing.  Fact Sheet for Patients: EntrepreneurPulse.com.au  Fact Sheet for Healthcare Providers: IncredibleEmployment.be  This test is not yet approved or cleared by the [place] FDA and has been authorized for detection and/or diagnosis of SARS-CoV-2 by FDA under  an Emergency Use Authorization (EUA). This EUA will remain in effect (meaning this test can be used) for the duration of the COVID-19 declaration under Section 564(b)(1) of the Act, 21 U.S.C. section 360bbb-3(b)(1), unless the authorization is terminated or revoked.  Performed at [facility], [address], [place], [place] [address]          Radiology Studies: [facility] Chest [facility] 1 View  Result Date: 10/07/2022 CLINICAL DATA:  Acute respiratory failure with hypoxemia. Shortness of breath. EXAM: PORTABLE CHEST 1 VIEW COMPARISON:  Radiograph and CT 10/03/2022 FINDINGS: Left chest port remains in place. Emphysema with upper lobe scarring. No acute airspace disease. Stable heart size and mediastinal contours. No pleural fluid or pneumothorax. Presumed telemetry lead on the right. IMPRESSION: 1. No acute abnormality. 2. Emphysema with upper lobe scarring, unchanged from recent chest CT. Electronically Signed   By: Keith Rake M.D.   On: 10/07/2022 19:47        Scheduled Meds:  amLODipine  5 mg Oral QHS   atorvastatin  40 mg Oral Daily   Chlorhexidine Gluconate Cloth  6 each Topical Daily   dextromethorphan-guaiFENesin  2 tablet Oral BID   ferrous sulfate  325 mg Oral QODAY   ipratropium-albuterol  3 mL Nebulization BID   methylPREDNISolone (SOLU-MEDROL) injection  60 mg Intravenous Q8H   mometasone-formoterol  2 puff Inhalation BID   nortriptyline  25 mg Oral QHS   pantoprazole  40 mg Oral Daily   potassium chloride  40 mEq Oral BID   Continuous Infusions:  piperacillin-tazobactam (ZOSYN)  IV 3.375 g (10/09/22 1359)     LOS: 4 days    Time spent: 35 minutes    Barb Merino, MD [facility] Pager [phone]

## 2022-10-09 NOTE — Care Management Important Message (Signed)
Important Message  Patient Details  Name: [name] MRN: [id number] Date of Birth: [birth year]   Medicare Important Message Given:  Other (see comment) (copy mailed to address on file due to precautions,unable to reach by phone at [phone], [phone])     Tommy Medal 10/09/2022, 12:43 PM

## 2022-10-10 DIAGNOSIS — D649 Anemia, unspecified: Secondary | ICD-10-CM | POA: Diagnosis not present

## 2022-10-10 MED ORDER — HEPARIN SOD (PORK) LOCK FLUSH 100 UNIT/ML IV SOLN
INTRAVENOUS | Status: AC
  Administered 2022-10-10: 500 [IU] via INTRAVENOUS
  Filled 2022-10-10: qty 5

## 2022-10-10 MED ORDER — HEPARIN SOD (PORK) LOCK FLUSH 100 UNIT/ML IV SOLN: 500.0000 [IU] | Freq: Once | INTRAVENOUS | Status: AC

## 2022-10-10 MED ORDER — DM-GUAIFENESIN ER 30-600 MG PO TB12: 2.0000 | ORAL_TABLET | Freq: Two times a day (BID) | ORAL | 0 refills | Status: AC

## 2022-10-10 NOTE — Discharge Summary (Signed)
Physician Discharge Summary  [name] UVO:[id number] DOB: [birth year] DOA: 10/03/2022  PCP: [name], MD  Admit date: 10/03/2022 Discharge date: 10/10/2022  Admitted From: Home Disposition: Home  Recommendations for Outpatient Follow-up:  Follow up with PCP in 1-2 weeks Follow-up with oncology and pulmonary as scheduled.  Home Health: N/A Equipment/Devices: N/A  Discharge Condition: Stable CODE STATUS: Full code Diet recommendation: Low-salt diet, nutritional supplements  Discharge summary: 66 year old with history of ovarian cancer on oral chemotherapy with Zeluja , COPD and chronic hypoxemic failure on 3 L oxygen at home, hypertension hyperlipidemia presented to the emergency room with 2 days history of cough, increasing dyspnea requiring more oxygen at home, dizziness and lightheadedness.  She was admitted for COPD exacerbation, pancytopenia, symptomatic anemia.  Seen and followed by pulmonary, oncology in the hospital.   Assessment & Plan of care:    COPD with acute exacerbation: Acute on chronic hypoxemic respiratory failure Left lower lobe pneumonia  CT angiogram of the chest without pulmonary embolism or aortic dissection.  It showed emphysema and bronchiectasis.   Patient was treated with aggressive bronchodilator therapy and IV steroids along with inhalational steroids. Initially treated with azithromycin then subsequently with Zosyn per day 3 today. Cultures are negative.  Clinically improving. Patient will be going home today on Prednisone 40 mg daily for another 4 days, she is on chronic prednisone therapy 5 mg daily that she will continue indefinitely for now. She will do chest physiotherapy at home. Patient will use her home oxygen, with her progressive disease she may need more oxygen and this was advised to the patient and family. She does have outpatient pulmonary follow-up. Patient is already optimized on nebulizer therapy, Breztri inhaler.    Pancytopenia, symptomatic anemia, anemia of chronic disease: Presentation hemoglobin 6.7-monitor PRBC with appropriate response.  V42 and folic acid adequate.  FOBT negative. Presentation Wbc of 1.6, ANC 0.6 and platelets 34,000.  Patient was given 2 doses of Granix with adequate improvement. Continue to hold chemotherapy on discharge.   Ovarian cancer: Oncology office to schedule follow-up.   GERD: PPI.   Essential hypertension blood pressure stable on amlodipine.  Continue.   Hypokalemia: Replaced.   Patient is adequately stabilized to go home today.  In addition to her home medications, she will be prescribed short course of prednisone 40 mg daily for 4 days and Augmentin to complete total 7 days of antibiotic therapy.   Discharge Diagnoses:  Principal Problem:   Symptomatic anemia Active Problems:   Essential hypertension   Iron deficiency anemia   Acute on chronic respiratory failure with hypoxia (HCC)   Mixed hyperlipidemia   Ovarian cancer (HCC)   GERD (gastroesophageal reflux disease)   COPD exacerbation (HCC)   Macrocytic anemia   Pancytopenia (HCC)    Discharge Instructions  Discharge Instructions     Call MD for:  difficulty breathing, headache or visual disturbances   Complete by: As directed    Diet - low sodium heart healthy   Complete by: As directed    Increase activity slowly   Complete by: As directed       Allergies as of 10/10/2022   No Known Allergies      Medication List     STOP taking these medications    gabapentin 600 MG tablet Commonly known as: NEURONTIN   magic mouthwash Soln   Trelegy Ellipta 100-62.5-25 MCG/ACT Aepb Generic drug: Fluticasone-Umeclidin-Vilant   Zejula 200 MG tablet Generic drug: niraparib tosylate  TAKE these medications    albuterol 108 (90 Base) MCG/ACT inhaler Commonly known as: VENTOLIN HFA Inhale 2 puffs into the lungs every 6 (six) hours as needed for wheezing or shortness of  breath.   amLODipine 5 MG tablet Commonly known as: NORVASC Take 5 mg by mouth at bedtime.   amoxicillin-clavulanate 875-125 MG tablet Commonly known as: AUGMENTIN Take 1 tablet by mouth 2 (two) times daily for 5 days.   aspirin EC 81 MG tablet Take 1 tablet (81 mg total) by mouth daily with breakfast.   atorvastatin 40 MG tablet Commonly known as: LIPITOR Take 1 tablet (40 mg total) by mouth daily.   Breztri Aerosphere 160-9-4.8 MCG/ACT Aero Generic drug: Budeson-Glycopyrrol-Formoterol Inhale 2 puffs into the lungs 2 (two) times daily.   dextromethorphan-guaiFENesin 30-600 MG 12hr tablet Commonly known as: MUCINEX DM Take 2 tablets by mouth 2 (two) times daily for 14 days.   ferrous sulfate 325 (65 FE) MG tablet Take 325 mg by mouth every other day.   ipratropium 17 MCG/ACT inhaler Commonly known as: ATROVENT HFA Inhale 2 puffs into the lungs every 6 (six) hours as needed for wheezing.   levalbuterol 1.25 MG/3ML nebulizer solution Commonly known as: XOPENEX Take 1.25 mg by nebulization every 6 (six) hours as needed for shortness of breath or wheezing.   magnesium oxide 400 (240 Mg) MG tablet Commonly known as: MAG-OX Take 1 tablet (400 mg total) by mouth 2 (two) times daily.   nortriptyline 25 MG capsule Commonly known as: PAMELOR Take 25 mg by mouth at bedtime.   nystatin 100000 UNIT/ML suspension Commonly known as: MYCOSTATIN Take by mouth.   OXYGEN Inhale 3 L into the lungs continuous.   pantoprazole 40 MG tablet Commonly known as: Protonix Take 1 tablet (40 mg total) by mouth 2 (two) times daily.   predniSONE 5 MG tablet Commonly known as: DELTASONE Take 5 mg by mouth daily with breakfast. What changed: Another medication with the same name was added. Make sure you understand how and when to take each.   predniSONE 20 MG tablet Commonly known as: DELTASONE Take 2 tablets (40 mg total) by mouth daily for 4 days. What changed: You were already taking  a medication with the same name, and this prescription was added. Make sure you understand how and when to take each.   primidone 50 MG tablet Commonly known as: MYSOLINE Take 50 mg by mouth at bedtime.        No Known Allergies  Consultations: Pulmonary Oncology   Procedures/Studies: DG Chest Port 1 View  Result Date: 10/07/2022 CLINICAL DATA:  Acute respiratory failure with hypoxemia. Shortness of breath. EXAM: PORTABLE CHEST 1 VIEW COMPARISON:  Radiograph and CT 10/03/2022 FINDINGS: Left chest port remains in place. Emphysema with upper lobe scarring. No acute airspace disease. Stable heart size and mediastinal contours. No pleural fluid or pneumothorax. Presumed telemetry lead on the right. IMPRESSION: 1. No acute abnormality. 2. Emphysema with upper lobe scarring, unchanged from recent chest CT. Electronically Signed   By: Keith Rake M.D.   On: 10/07/2022 19:47   CT Angio Chest PE W and/or Wo Contrast  Result Date: 10/03/2022 CLINICAL DATA:  Shortness of breath and dizzy EXAM: CT ANGIOGRAPHY CHEST WITH CONTRAST TECHNIQUE: Multidetector CT imaging of the chest was performed using the standard protocol during bolus administration of intravenous contrast. Multiplanar CT image reconstructions and MIPs were obtained to evaluate the vascular anatomy. RADIATION DOSE REDUCTION: This exam was performed according to the departmental  dose-optimization program which includes automated exposure control, adjustment of the mA and/or kV according to patient size and/or use of iterative reconstruction technique. CONTRAST:  22m OMNIPAQUE IOHEXOL 350 MG/ML SOLN COMPARISON:  Chest x-ray 10/03/2022, CT chest 07/31/2022 FINDINGS: Cardiovascular: Satisfactory opacification of the pulmonary arteries to the segmental level. No evidence of pulmonary embolism. Non aneurysmal aorta. Moderate aortic atherosclerosis. No dissection is seen. Normal cardiac size. No pericardial effusion. Mediastinum/Nodes:  Midline trachea. No thyroid mass. Stable subcentimeter mediastinal lymph nodes. Small hilar nodes. Esophagus within normal limits. Small hiatal hernia. Lungs/Pleura: Emphysema. No acute airspace disease. No enlarging pulmonary nodules. Stable scattered clustered central lobular nodules. Mild lower and upper lobe bronchiectasis and bronchial wall thickening. Upper Abdomen: No acute finding Musculoskeletal: No acute osseous abnormality. Mild compression deformity of T8, stable since September. Review of the MIP images confirms the above findings. IMPRESSION: 1. Negative for acute pulmonary embolus or aortic dissection. 2. Emphysema. No acute airspace disease. Mild bronchiectasis and bronchial wall thickening with scattered clustered central lobular nodules, stable to recent priors. No enlarging pulmonary nodules 3. Aortic atherosclerosis. Aortic Atherosclerosis (ICD10-I70.0) and Emphysema (ICD10-J43.9). Electronically Signed   By: KDonavan FoilM.D.   On: 10/03/2022 20:40   CT Head Wo Contrast  Result Date: 10/03/2022 CLINICAL DATA:  Shortness of breath, dizziness EXAM: CT HEAD WITHOUT CONTRAST TECHNIQUE: Contiguous axial images were obtained from the base of the skull through the vertex without intravenous contrast. RADIATION DOSE REDUCTION: This exam was performed according to the departmental dose-optimization program which includes automated exposure control, adjustment of the mA and/or kV according to patient size and/or use of iterative reconstruction technique. COMPARISON:  01/28/2022 FINDINGS: Brain: No acute intracranial abnormality. Specifically, no hemorrhage, hydrocephalus, mass lesion, acute infarction, or significant intracranial injury. Vascular: No hyperdense vessel or unexpected calcification. Skull: No acute calvarial abnormality. Sinuses/Orbits: No acute findings Other: None IMPRESSION: No acute intracranial abnormality. Electronically Signed   By: KRolm BaptiseM.D.   On: 10/03/2022 20:22    DG Chest 2 View  Result Date: 10/03/2022 CLINICAL DATA:  Shortness of breath and dizziness for 2 weeks. Cough with productive green mucus for 2 days. History of COPD. EXAM: CHEST - 2 VIEW COMPARISON:  Chest two views 07/31/2022 and 07/17/2022 FINDINGS: Left chest wall porta catheter tip overlies the superior vena cava/right atrial junction. Cardiac silhouette is normal in size. Moderate calcifications within the aortic arch. Findings the diaphragms and moderate hyperinflation. Increased lucencies within the upper lungs consistent with the emphysematous changes better seen on prior CT. Mild left costophrenic angle scarring is unchanged from multiple prior radiographs. No new focal airspace opacity. No pleural effusion or pneumothorax. Mild multilevel degenerative disc changes of the thoracic spine. IMPRESSION: 1. No active cardiopulmonary disease. 2. COPD. 3. Aortic atherosclerosis. 4. Mild left costophrenic angle scarring, unchanged. Electronically Signed   By: RYvonne KendallM.D.   On: 10/03/2022 18:01   (Echo, Carotid, EGD, Colonoscopy, ERCP)    Subjective: Patient seen and examined.  At rest no complaints.  She tells me that she may have to go slow and take her time to move around so that her oxygen does not drop.  No other overnight events.  Remains on 3 L oxygen.  Denies any nausea vomiting cough or wheezing.   Discharge Exam: Vitals:   10/09/22 2048 10/10/22 0421  BP: 129/74 139/74  Pulse: (!) 105 100  Resp: 16 16  Temp: (!) 96.9 F (36.1 C) (!) 96.9 F (36.1 C)  SpO2: 98% 99%  Vitals:   10/09/22 1609 10/09/22 2009 10/09/22 2048 10/10/22 0421  BP: 136/78  129/74 139/74  Pulse: (!) 103  (!) 105 100  Resp: '18  16 16  '$ Temp: 98.3 F (36.8 C)  (!) 96.9 F (36.1 C) (!) 96.9 F (36.1 C)  TempSrc: Oral     SpO2: 97% 95% 98% 99%  Weight:      Height:        General: Pt is alert, awake, not in acute distress Frail.  Not in any distress.  Able to talk in complete  sentences. Cardiovascular: RRR, S1/S2 +, no rubs, no gallops Respiratory: CTA bilaterally, poor air entry at the posterior base with occasional expiratory wheezes. Abdominal: Soft, NT, ND, bowel sounds + Extremities: no edema, no cyanosis    The results of significant diagnostics from this hospitalization (including imaging, microbiology, ancillary and laboratory) are listed below for reference.     Microbiology: Recent Results (from the past 240 hour(s))  Resp Panel by RT-PCR (Flu A&B, Covid) Anterior Nasal Swab     Status: None   Collection Time: 10/03/22  6:23 PM   Specimen: Anterior Nasal Swab  Result Value Ref Range Status   SARS Coronavirus 2 by RT PCR NEGATIVE NEGATIVE Final    Comment: (NOTE) SARS-CoV-2 target nucleic acids are NOT DETECTED.  The SARS-CoV-2 RNA is generally detectable in upper respiratory specimens during the acute phase of infection. The lowest concentration of SARS-CoV-2 viral copies this assay can detect is 138 copies/mL. A negative result does not preclude SARS-Cov-2 infection and should not be used as the sole basis for treatment or other patient management decisions. A negative result may occur with  improper specimen collection/handling, submission of specimen other than nasopharyngeal swab, presence of viral mutation(s) within the areas targeted by this assay, and inadequate number of viral copies(<138 copies/mL). A negative result must be combined with clinical observations, patient history, and epidemiological information. The expected result is Negative.  Fact Sheet for Patients:  EntrepreneurPulse.com.au  Fact Sheet for Healthcare Providers:  IncredibleEmployment.be  This test is no t yet approved or cleared by the [place] FDA and  has been authorized for detection and/or diagnosis of SARS-CoV-2 by FDA under an Emergency Use Authorization (EUA). This EUA will remain  in effect (meaning this test  can be used) for the duration of the COVID-19 declaration under Section 564(b)(1) of the Act, 21 U.S.C.section 360bbb-3(b)(1), unless the authorization is terminated  or revoked sooner.       Influenza A by PCR NEGATIVE NEGATIVE Final   Influenza B by PCR NEGATIVE NEGATIVE Final    Comment: (NOTE) The Xpert Xpress SARS-CoV-2/FLU/RSV plus assay is intended as an aid in the diagnosis of influenza from Nasopharyngeal swab specimens and should not be used as a sole basis for treatment. Nasal washings and aspirates are unacceptable for Xpert Xpress SARS-CoV-2/FLU/RSV testing.  Fact Sheet for Patients: EntrepreneurPulse.com.au  Fact Sheet for Healthcare Providers: IncredibleEmployment.be  This test is not yet approved or cleared by the [place] FDA and has been authorized for detection and/or diagnosis of SARS-CoV-2 by FDA under an Emergency Use Authorization (EUA). This EUA will remain in effect (meaning this test can be used) for the duration of the COVID-19 declaration under Section 564(b)(1) of the Act, 21 U.S.C. section 360bbb-3(b)(1), unless the authorization is terminated or revoked.  Performed at [facility], [address], [place],  [address]      Labs: BNP (last 3 results) Recent Labs  06/28/22 1203 07/31/22 1745 10/03/22 1817  BNP 27.0 29.0 31.4   Basic Metabolic Panel: Recent Labs  Lab 10/03/22 1817 10/04/22 0326 10/07/22 0611 10/08/22 0325 10/09/22 0257  NA 134* 135 137 133* 133*  K 3.5 3.9 3.3* 3.8 3.1*  CL 93* 97* 95* 91* 90*  CO2 31 29 32 31 32  GLUCOSE 117* 135* 105* 126* 125*  BUN '11 9 13 16 18  '$ CREATININE 0.66 0.52 0.57 0.65 0.71  CALCIUM 8.8* 8.4* 8.5* 8.6* 8.2*  MG  --  2.1  --  1.9  --   PHOS  --  4.0  --   --   --    Liver Function Tests: Recent Labs  Lab 10/03/22 1817 10/04/22 0326  AST 23 19  ALT 13 11  ALKPHOS 89 83  BILITOT 0.4 0.9  PROT 6.5 6.3*  ALBUMIN 3.6 3.3*   No  results for input(s): "LIPASE", "AMYLASE" in the last 168 hours. No results for input(s): "AMMONIA" in the last 168 hours. CBC: Recent Labs  Lab 10/05/22 0449 10/06/22 0509 10/07/22 0611 10/08/22 0325 10/09/22 0257  WBC 1.2* 1.1* 1.6* 6.4 11.3*  NEUTROABS 0.7* 0.3* 0.3* 1.9 8.0*  HGB 9.1* 8.7* 9.4* 9.6* 9.6*  HCT 27.2* 26.6* 28.4* 29.0* 28.7*  MCV 94.1 96.0 95.6 97.3 98.0  PLT 55* 69* 86* 102* 108*   Cardiac Enzymes: No results for input(s): "CKTOTAL", "CKMB", "CKMBINDEX", "TROPONINI" in the last 168 hours. BNP: Invalid input(s): "POCBNP" CBG: No results for input(s): "GLUCAP" in the last 168 hours. D-Dimer No results for input(s): "DDIMER" in the last 72 hours. Hgb A1c No results for input(s): "HGBA1C" in the last 72 hours. Lipid Profile No results for input(s): "CHOL", "HDL", "LDLCALC", "TRIG", "CHOLHDL", "LDLDIRECT" in the last 72 hours. Thyroid function studies No results for input(s): "TSH", "T4TOTAL", "T3FREE", "THYROIDAB" in the last 72 hours.  Invalid input(s): "FREET3" Anemia work up No results for input(s): "VITAMINB12", "FOLATE", "FERRITIN", "TIBC", "IRON", "RETICCTPCT" in the last 72 hours. Urinalysis    Component Value Date/Time   COLORURINE STRAW (A) 03/10/2022 1232   APPEARANCEUR HAZY (A) 03/10/2022 1232   APPEARANCEUR Clear 10/08/2013 2007   LABSPEC 1.031 (H) 03/10/2022 1232   LABSPEC 1.003 10/08/2013 2007   PHURINE 6.0 03/10/2022 1232   GLUCOSEU NEGATIVE 03/10/2022 1232   GLUCOSEU Negative 10/08/2013 2007   HGBUR SMALL (A) 03/10/2022 1232   BILIRUBINUR NEGATIVE 03/10/2022 1232   BILIRUBINUR Negative 10/08/2013 2007   KETONESUR NEGATIVE 03/10/2022 1232   PROTEINUR NEGATIVE 03/10/2022 1232   NITRITE NEGATIVE 03/10/2022 1232   LEUKOCYTESUR LARGE (A) 03/10/2022 1232   LEUKOCYTESUR Negative 10/08/2013 2007   Sepsis Labs Recent Labs  Lab 10/06/22 0509 10/07/22 0611 10/08/22 0325 10/09/22 0257  WBC 1.1* 1.6* 6.4 11.3*   Microbiology Recent  Results (from the past 240 hour(s))  Resp Panel by RT-PCR (Flu A&B, Covid) Anterior Nasal Swab     Status: None   Collection Time: 10/03/22  6:23 PM   Specimen: Anterior Nasal Swab  Result Value Ref Range Status   SARS Coronavirus 2 by RT PCR NEGATIVE NEGATIVE Final    Comment: (NOTE) SARS-CoV-2 target nucleic acids are NOT DETECTED.  The SARS-CoV-2 RNA is generally detectable in upper respiratory specimens during the acute phase of infection. The lowest concentration of SARS-CoV-2 viral copies this assay can detect is 138 copies/mL. A negative result does not preclude SARS-Cov-2 infection and should not be used as the sole basis for treatment or other patient management decisions. A negative  result may occur with  improper specimen collection/handling, submission of specimen other than nasopharyngeal swab, presence of viral mutation(s) within the areas targeted by this assay, and inadequate number of viral copies(<138 copies/mL). A negative result must be combined with clinical observations, patient history, and epidemiological information. The expected result is Negative.  Fact Sheet for Patients:  EntrepreneurPulse.com.au  Fact Sheet for Healthcare Providers:  IncredibleEmployment.be  This test is no t yet approved or cleared by the [place] FDA and  has been authorized for detection and/or diagnosis of SARS-CoV-2 by FDA under an Emergency Use Authorization (EUA). This EUA will remain  in effect (meaning this test can be used) for the duration of the COVID-19 declaration under Section 564(b)(1) of the Act, 21 U.S.C.section 360bbb-3(b)(1), unless the authorization is terminated  or revoked sooner.       Influenza A by PCR NEGATIVE NEGATIVE Final   Influenza B by PCR NEGATIVE NEGATIVE Final    Comment: (NOTE) The Xpert Xpress SARS-CoV-2/FLU/RSV plus assay is intended as an aid in the diagnosis of influenza from Nasopharyngeal swab  specimens and should not be used as a sole basis for treatment. Nasal washings and aspirates are unacceptable for Xpert Xpress SARS-CoV-2/FLU/RSV testing.  Fact Sheet for Patients: EntrepreneurPulse.com.au  Fact Sheet for Healthcare Providers: IncredibleEmployment.be  This test is not yet approved or cleared by the [place] FDA and has been authorized for detection and/or diagnosis of SARS-CoV-2 by FDA under an Emergency Use Authorization (EUA). This EUA will remain in effect (meaning this test can be used) for the duration of the COVID-19 declaration under Section 564(b)(1) of the Act, 21 U.S.C. section 360bbb-3(b)(1), unless the authorization is terminated or revoked.  Performed at [facility], [address], [place], [place] [address]      Time coordinating discharge:  35 minutes  SIGNED:   Barb Merino, MD  [facility] 10/10/2022, 7:51 AM

## 2022-10-10 NOTE — Progress Notes (Signed)
Patient  stable and ready for discharge home. Patient's left chest port  de ccessed by another Therapist, sports. Writer went over paperwork with patient an her boyfriend and both verbalized understanding.boyfriend helped dress patient and brought patient's home O2. Writer transported [employer] to car.

## 2022-10-14 ENCOUNTER — Encounter: Payer: Self-pay | Admitting: *Deleted

## 2022-10-14 ENCOUNTER — Inpatient Hospital Stay: Payer: Medicare HMO

## 2022-10-14 ENCOUNTER — Inpatient Hospital Stay: Payer: Medicare HMO | Attending: Hematology | Admitting: Hematology

## 2022-10-14 ENCOUNTER — Other Ambulatory Visit: Payer: Medicare HMO

## 2022-10-14 DIAGNOSIS — C786 Secondary malignant neoplasm of retroperitoneum and peritoneum: Secondary | ICD-10-CM | POA: Diagnosis present

## 2022-10-14 DIAGNOSIS — C569 Malignant neoplasm of unspecified ovary: Secondary | ICD-10-CM

## 2022-10-14 DIAGNOSIS — D649 Anemia, unspecified: Secondary | ICD-10-CM | POA: Diagnosis not present

## 2022-10-14 DIAGNOSIS — Z800 Family history of malignant neoplasm of digestive organs: Secondary | ICD-10-CM | POA: Insufficient documentation

## 2022-10-14 DIAGNOSIS — G629 Polyneuropathy, unspecified: Secondary | ICD-10-CM | POA: Insufficient documentation

## 2022-10-14 DIAGNOSIS — I10 Essential (primary) hypertension: Secondary | ICD-10-CM | POA: Diagnosis not present

## 2022-10-14 DIAGNOSIS — Z87891 Personal history of nicotine dependence: Secondary | ICD-10-CM | POA: Diagnosis not present

## 2022-10-14 DIAGNOSIS — Z8042 Family history of malignant neoplasm of prostate: Secondary | ICD-10-CM | POA: Diagnosis not present

## 2022-10-14 DIAGNOSIS — D539 Nutritional anemia, unspecified: Secondary | ICD-10-CM | POA: Diagnosis not present

## 2022-10-14 DIAGNOSIS — J449 Chronic obstructive pulmonary disease, unspecified: Secondary | ICD-10-CM | POA: Insufficient documentation

## 2022-10-14 LAB — COMPREHENSIVE METABOLIC PANEL
ALT: 15 U/L (ref 0–44)
AST: 20 U/L (ref 15–41)
Albumin: 3.3 g/dL — ABNORMAL LOW (ref 3.5–5.0)
Alkaline Phosphatase: 79 U/L (ref 38–126)
Anion gap: 9 (ref 5–15)
BUN: 11 mg/dL (ref 8–23)
CO2: 33 mmol/L — ABNORMAL HIGH (ref 22–32)
Calcium: 8.5 mg/dL — ABNORMAL LOW (ref 8.9–10.3)
Chloride: 90 mmol/L — ABNORMAL LOW (ref 98–111)
Creatinine, Ser: 0.58 mg/dL (ref 0.44–1.00)
GFR, Estimated: >60 mL/min (ref >60)
Glucose, Bld: 99 mg/dL (ref 70–99)
Potassium: 4.2 mmol/L (ref 3.5–5.1)
Sodium: 132 mmol/L — ABNORMAL LOW (ref 135–145)
Total Bilirubin: 0.4 mg/dL (ref 0.3–1.2)
Total Protein: 5.8 g/dL — ABNORMAL LOW (ref 6.5–8.1)

## 2022-10-14 LAB — CBC WITH DIFFERENTIAL/PLATELET
Abs Immature Granulocytes: 0.18 10*3/uL — ABNORMAL HIGH (ref 0.00–0.07)
Basophils Absolute: 0 10*3/uL (ref 0.0–0.1)
Basophils Relative: 0 %
Eosinophils Absolute: 0.3 10*3/uL (ref 0.0–0.5)
Eosinophils Relative: 4 %
HCT: 29.6 % — ABNORMAL LOW (ref 36.0–46.0)
Hemoglobin: 9.9 g/dL — ABNORMAL LOW (ref 12.0–15.0)
Immature Granulocytes: 2 %
Lymphocytes Relative: 16 %
Lymphs Abs: 1.2 10*3/uL (ref 0.7–4.0)
MCH: 31.9 pg (ref 26.0–34.0)
MCHC: 33.4 g/dL (ref 30.0–36.0)
MCV: 95.5 fL (ref 80.0–100.0)
Monocytes Absolute: 0.9 10*3/uL (ref 0.1–1.0)
Monocytes Relative: 13 %
Neutro Abs: 4.8 10*3/uL (ref 1.7–7.7)
Neutrophils Relative %: 65 %
Platelets: 251 10*3/uL (ref 150–400)
RBC: 3.1 MIL/uL — ABNORMAL LOW (ref 3.87–5.11)
RDW: 17.6 % — ABNORMAL HIGH (ref 11.5–15.5)
WBC: 7.4 10*3/uL (ref 4.0–10.5)
nRBC: 0.3 % — ABNORMAL HIGH (ref 0.0–0.2)

## 2022-10-14 NOTE — Patient Instructions (Addendum)
[facility]  Discharge Instructions  You were seen and examined today by [name].  Your labs are improving.  Continue with follow-up appointments as previously scheduled. Do NOT restart Zejula.  Thank you for choosing [facility] to provide your oncology and hematology care.   To afford each patient quality time with our provider, please arrive at least 15 minutes before your scheduled appointment time. You may need to reschedule your appointment if you arrive late (10 or more minutes). Arriving late affects you and other patients whose appointments are after yours.  Also, if you miss three or more appointments without notifying the office, you may be dismissed from the clinic at the provider's discretion.    Again, thank you for choosing [facility].  Our hope is that these requests will decrease the amount of time that you wait before being seen by our physicians.   If you have a lab appointment with the [facility] please come in thru the Main Entrance and check in at the main information desk.           _____________________________________________________________  Should you have questions after your visit to [facility], please contact our office at [phone] and follow the prompts.  Our office hours are 8:00 a.m. to 4:30 p.m. Monday - Thursday and 8:00 a.m. to 2:30 p.m. Friday.  Please note that voicemails left after 4:00 p.m. may not be returned until the following business day.  We are closed weekends and all major holidays.  You do have access to a nurse 24-7, just call the main number to the clinic [phone] and do not press any options, hold on the line and a nurse will answer the phone.    For prescription refill requests, have your pharmacy contact our office and allow 72 hours.    Masks are optional in the cancer centers. If you would like for your care team to wear a mask while  they are taking care of you, please let them know. You may have one support person who is at least 64 years old accompany you for your appointments.

## 2022-10-14 NOTE — Progress Notes (Signed)
[place], [place] [address]   CLINIC:  Medical Oncology/Hematology  PCP:  [name], [address] / [place] [address] [phone]   REASON FOR VISIT:  Follow-up for high-grade serous ovarian carcinoma  PRIOR THERAPY: none  NGS Results: BRCA 1/2 negative  CURRENT THERAPY: Carboplatin, paclitaxel & Aloxi every 3 weeks  BRIEF ONCOLOGIC HISTORY:  Oncology History  Peritoneal carcinomatosis ([place])  06/06/2020 Initial Diagnosis   Peritoneal carcinomatosis ([place])   07/05/2020 -  Chemotherapy    Patient is on Treatment Plan: OVARIAN CARBOPLATIN (AUC 6) / PACLITAXEL (175) Q21D X 6 CYCLES      07/22/2020 Genetic Testing   Negative genetic testing: no pathogenic variants detected in [facility] Expanded CancerNext Panel + RNAInsight. The CancerNext-Expanded gene panel offered by [facility] and includes sequencing and rearrangement analysis for the following 77 genes: AIP, ALK, APC*, ATM*, AXIN2, BAP1, BARD1, BLM, BMPR1A, BRCA1*, BRCA2*, BRIP1*, CDC73, CDH1*, CDK4, CDKN1B, CDKN2A, CHEK2*, CTNNA1, DICER1, FANCC, FH, FLCN, GALNT12, KIF1B, LZTR1, MAX, MEN1, MET, MLH1*, MSH2*, MSH3, MSH6*, MUTYH*, NBN, NF1*, NF2, NTHL1, PALB2*, PHOX2B, PMS2*, POT1, PRKAR1A, PTCH1, PTEN*, RAD51C*, RAD51D*, RB1, RECQL, RET, SDHA, SDHAF2, SDHB, SDHC, SDHD, SMAD4, SMARCA4, SMARCB1, SMARCE1, STK11, SUFU, TMEM127, TP53*, TSC1, TSC2, VHL and XRCC2 (sequencing and deletion/duplication); EGFR, EGLN1, HOXB13, KIT, MITF, PDGFRA, POLD1, and POLE (sequencing only); EPCAM and GREM1 (deletion/duplication only). DNA and RNA analyses performed for * genes. The report date is July 22, 2020.    Ovarian cancer ([place])  07/07/2020 Initial Diagnosis   Ovarian cancer ([place])   05/04/2021 Imaging   1. No findings today to suggest new or progressive disease related to ovarian cancer. 2. Interval near complete resolution of the irregular and nodular areas of consolidative left upper  lobe opacity seen on the previous study. Residual architectural distortion in this region is compatible with scarring. Tiny nodules persist in the left upper lobe and attention on follow-up recommended. 3. Interval improvement in tree-in-bud nodularity anteromedial left upper lobe. Changes of bronchial wall thickening and mild cylindrical bronchiectasis in the lower lungs are similar to prior with persistent tree-in-bud nodularity posterior left lower lobe. Imaging features suggest sequelae of atypical infection. 4. Similar appearance of the omental soft tissue stranding with less prominent nodularity on today's study. 5. No ascites. 6. Mild circumferential wall thickening distal esophagus. Esophagitis could have this appearance. 7. Left colonic diverticulosis without diverticulitis. 8. Aortic Atherosclerosis (ICD10-I70.0) and Emphysema (ICD10-J43.9).   05/30/2021 -  Chemotherapy   She takes niraparib         CANCER STAGING:  Cancer Staging  No matching staging information was found for the patient.  INTERVAL HISTORY:  [name], a 66 y.o. female, seen for follow-up after recent hospitalization.  She was taking Zejula 200 mg daily.  She was hospitalized from 11/16 through 10/10/2022 for pancytopenia and required blood transfusion.  Noel Journey has been stopped since then.  She reports decrease in energy levels.  Denies any bleeding per rectum or melena.  Denies any other bleeding issues.  REVIEW OF SYSTEMS:  Review of Systems  Respiratory:  Positive for cough and shortness of breath (3L).   Gastrointestinal:  Negative for diarrhea.  Skin:  Negative for rash.  Neurological:  Positive for dizziness.  All other systems reviewed and are negative.   PAST MEDICAL/SURGICAL HISTORY:  Past Medical History:  Diagnosis Date   Anemia    Aortic atherosclerosis ([place])    Arthritis    Asthma  Bilateral carotid artery stenosis    Cancer (HCC)    Gastric Cancer   COPD (chronic  obstructive pulmonary disease) (HCC)    High cholesterol    Hyperlipidemia    Hypertension    Neuropathy    Osteoarthritis    Ovarian cancer ([place])    Oxygen dependent    Peripheral vascular disease ([place])    Peritoneal carcinoma ([place])    Pneumonia 2019   Port-A-Cath in place 07/03/2020   Pulmonary emphysema [facility])    Past Surgical History:  Procedure Laterality Date   BIOPSY  06/30/2022   Procedure: BIOPSY;  Surgeon: Eloise Harman, DO;  [facility];  Service: Endoscopy;;  gastric   BIOPSY  09/03/2022   Procedure: BIOPSY;  Surgeon: Harvel Quale, MD;  [facility];  Service: Gastroenterology;;   COLONOSCOPY WITH PROPOFOL N/A 09/03/2022   Procedure: COLONOSCOPY WITH PROPOFOL;  Surgeon: Harvel Quale, MD;  [facility];  Service: Gastroenterology;  Laterality: N/A;  900 ASA 3   ESOPHAGEAL BRUSHING  09/03/2022   Procedure: ESOPHAGEAL BRUSHING;  Surgeon: Montez Morita, Quillian Quince, MD;  [facility];  Service: Gastroenterology;;   ESOPHAGOGASTRODUODENOSCOPY (EGD) WITH PROPOFOL N/A 02/17/2021   Procedure: ESOPHAGOGASTRODUODENOSCOPY (EGD) WITH PROPOFOL;  Surgeon: Harvel Quale, MD;  [facility];  Service: Gastroenterology;  Laterality: N/A;   ESOPHAGOGASTRODUODENOSCOPY (EGD) WITH PROPOFOL N/A 05/04/2022   Procedure: ESOPHAGOGASTRODUODENOSCOPY (EGD) WITH PROPOFOL;  Surgeon: Daneil Dolin, MD;  [facility];  Service: Endoscopy;  Laterality: N/A;   ESOPHAGOGASTRODUODENOSCOPY (EGD) WITH PROPOFOL N/A 06/30/2022   Procedure: ESOPHAGOGASTRODUODENOSCOPY (EGD) WITH PROPOFOL;  Surgeon: Eloise Harman, DO;  [facility];  Service: Endoscopy;  Laterality: N/A;   ESOPHAGOGASTRODUODENOSCOPY (EGD) WITH PROPOFOL N/A 09/03/2022   Procedure: ESOPHAGOGASTRODUODENOSCOPY (EGD) WITH PROPOFOL;  Surgeon: Harvel Quale, MD;  [facility];  Service: Gastroenterology;  Laterality:  N/A;   HALLUX VALGUS BASE WEDGE Right 06/09/2015   Procedure: Base wedge osteotomy with modified McBride right foot ;  Surgeon: Sharlotte Alamo, MD;  Location: [facility];  Service: Podiatry;  Laterality: Right;   PORTACATH PLACEMENT Left 06/28/2020   Procedure: PORT-A-CATHETER PLACEMENT LEFT CHEST (attached catheter in left subclavian);  Surgeon: Virl Cagey, MD;  Location: [facility];  Service: General;  Laterality: Left;   TUBAL LIGATION     VIDEO BRONCHOSCOPY WITH ENDOBRONCHIAL NAVIGATION N/A 03/09/2021   Procedure: VIDEO BRONCHOSCOPY WITH ENDOBRONCHIAL NAVIGATION;  Surgeon: Ottie Glazier, MD;  Location: [facility];  Service: Thoracic;  Laterality: N/A;   VIDEO BRONCHOSCOPY WITH ENDOBRONCHIAL ULTRASOUND N/A 03/09/2021   Procedure: VIDEO BRONCHOSCOPY WITH ENDOBRONCHIAL ULTRASOUND;  Surgeon: Ottie Glazier, MD;  Location: [facility];  Service: Thoracic;  Laterality: N/A;    SOCIAL HISTORY:  Social History   Socioeconomic History   Marital status: Divorced    Spouse name: Not on file   Number of children: 3   Years of education: Not on file   Highest education level: Not on file  Occupational History   Occupation: DISABLED  Tobacco Use   Smoking status: Former    Packs/day: 2.00    Years: 30.00    Total pack years: 60.00    Types: Cigarettes    Quit date: 11/04/2013    Years since quitting: 8.9   Smokeless tobacco: Never  Vaping Use   Vaping Use: Never used  Substance and Sexual Activity   Alcohol use: No   Drug use: No   Sexual activity: Not  Currently  Other Topics Concern   Not on file  Social History Narrative   Not on file   Social Determinants of Health   Financial Resource Strain: Low Risk  (09/27/2020)   Overall Financial Resource Strain (CARDIA)    Difficulty of Paying Living Expenses: Not hard at all  Food Insecurity: No Food Insecurity (08/01/2022)   Hunger Vital Sign    Worried About Running Out of Food in the Last Year: Never true    Ran Out of Food in the Last  Year: Never true  Transportation Needs: No Transportation Needs (08/01/2022)   PRAPARE - Hydrologist (Medical): No    Lack of Transportation (Non-Medical): No  Physical Activity: Inactive (09/27/2020)   Exercise Vital Sign    Days of Exercise per Week: 0 days    Minutes of Exercise per Session: 0 min  Stress: No Stress Concern Present (09/27/2020)   [place]    Feeling of Stress : Not at all  Social Connections: Socially Isolated (09/27/2020)   Social Connection and Isolation Panel [NHANES]    Frequency of Communication with Friends and Family: More than three times a week    Frequency of Social Gatherings with Friends and Family: Never    Attends Religious Services: Never    Marine scientist or Organizations: No    Attends Archivist Meetings: Never    Marital Status: Divorced  Human resources officer Violence: Not At Risk (08/01/2022)   Humiliation, Afraid, Rape, and Kick questionnaire    Fear of Current or Ex-Partner: No    Emotionally Abused: No    Physically Abused: No    Sexually Abused: No    FAMILY HISTORY:  Family History  Problem Relation Age of Onset   Alzheimer's disease Mother    COPD Father    Emphysema Father    Hypertension Father    Healthy Sister    Healthy Brother    Alzheimer's disease Maternal Grandmother    Healthy Sister    Healthy Sister    Healthy Sister    Prostate cancer Other        paternal grandmother's brother; dx in early 54s   Breast cancer Neg Hx     CURRENT MEDICATIONS:  Current Outpatient Medications  Medication Sig Dispense Refill   albuterol (VENTOLIN HFA) 108 (90 Base) MCG/ACT inhaler Inhale 2 puffs into the lungs every 6 (six) hours as needed for wheezing or shortness of breath. 18 g 3   amLODipine (NORVASC) 5 MG tablet Take 5 mg by mouth at bedtime.      amoxicillin-clavulanate (AUGMENTIN) 875-125 MG tablet Take 1 tablet by  mouth 2 (two) times daily for 5 days. 10 tablet 0   aspirin EC 81 MG tablet Take 1 tablet (81 mg total) by mouth daily with breakfast. 30 tablet 11   atorvastatin (LIPITOR) 40 MG tablet Take 1 tablet (40 mg total) by mouth daily. 30 tablet 1   Budeson-Glycopyrrol-Formoterol (BREZTRI AEROSPHERE) 160-9-4.8 MCG/ACT AERO Inhale 2 puffs into the lungs 2 (two) times daily. 10.7 g 11   dextromethorphan-guaiFENesin (MUCINEX DM) 30-600 MG 12hr tablet Take 2 tablets by mouth 2 (two) times daily for 14 days. 56 tablet 0   ferrous sulfate 325 (65 FE) MG tablet Take 325 mg by mouth every other day.     ipratropium (ATROVENT HFA) 17 MCG/ACT inhaler Inhale 2 puffs into the lungs every 6 (six) hours as needed for  wheezing.     levalbuterol (XOPENEX) 1.25 MG/3ML nebulizer solution Take 1.25 mg by nebulization every 6 (six) hours as needed for shortness of breath or wheezing.     magnesium oxide (MAG-OX) 400 (240 Mg) MG tablet Take 1 tablet (400 mg total) by mouth 2 (two) times daily. 120 tablet 3   nortriptyline (PAMELOR) 25 MG capsule Take 25 mg by mouth at bedtime.     nystatin (MYCOSTATIN) 100000 UNIT/ML suspension Take by mouth.     OXYGEN Inhale 3 L into the lungs continuous.     predniSONE (DELTASONE) 5 MG tablet Take 5 mg by mouth daily with breakfast.     primidone (MYSOLINE) 50 MG tablet Take 50 mg by mouth at bedtime.     pantoprazole (PROTONIX) 40 MG tablet Take 1 tablet (40 mg total) by mouth 2 (two) times daily. 180 tablet 3   No current facility-administered medications for this visit.    ALLERGIES:  No Known Allergies  PHYSICAL EXAM:  Performance status (ECOG): 1 - Symptomatic but completely ambulatory  There were no vitals filed for this visit.  Wt Readings from Last 3 Encounters:  10/04/22 115 lb 15.4 oz (52.6 kg)  09/10/22 116 lb 6.4 oz (52.8 kg)  09/03/22 114 lb 10.2 oz (52 kg)   Physical Exam Vitals reviewed.  Constitutional:      Appearance: Normal appearance.      Interventions: Nasal cannula in place.     Comments: In wheelchair  Cardiovascular:     Rate and Rhythm: Normal rate and regular rhythm.     Pulses: Normal pulses.     Heart sounds: Normal heart sounds.  Pulmonary:     Effort: Pulmonary effort is normal.     Breath sounds: Normal breath sounds.     Comments: Hoarse breath sounds bilateral bases Neurological:     General: No focal deficit present.     Mental Status: She is alert and oriented to person, place, and time.  Psychiatric:        Mood and Affect: Mood normal.        Behavior: Behavior normal.      LABORATORY DATA:  I have reviewed the labs as listed.     Latest Ref Rng & Units 10/14/2022   11:31 AM 10/09/2022    2:57 AM 10/08/2022    3:25 AM  CBC  WBC 4.0 - 10.5 K/uL 7.4  11.3  6.4   Hemoglobin 12.0 - 15.0 g/dL 9.9  9.6  9.6   Hematocrit 36.0 - 46.0 % 29.6  28.7  29.0   Platelets 150 - 400 K/uL 251  108  102       Latest Ref Rng & Units 10/14/2022   11:31 AM 10/09/2022    2:57 AM 10/08/2022    3:25 AM  CMP  Glucose 70 - 99 mg/dL 99  125  126   BUN 8 - 23 mg/dL _0 Creatinine 0.44 - 1.00 mg/dL 0.58  0.71  0.65   Sodium 135 - 145 mmol/L 132  133  133   Potassium 3.5 - 5.1 mmol/L 4.2  3.1  3.8   Chloride 98 - 111 mmol/L 90  90  91   CO2 22 - 32 mmol/L 33  32  31   Calcium 8.9 - 10.3 mg/dL 8.5  8.2  8.6   Total Protein 6.5 - 8.1 g/dL 5.8     Total Bilirubin 0.3 - 1.2 mg/dL 0.4  Alkaline Phos 38 - 126 U/L 79     AST 15 - 41 U/L 20     ALT 0 - 44 U/L 15       DIAGNOSTIC IMAGING:  I have independently reviewed the scans and discussed with the patient. DG Chest Port 1 View  Result Date: 10/07/2022 CLINICAL DATA:  Acute respiratory failure with hypoxemia. Shortness of breath. EXAM: PORTABLE CHEST 1 VIEW COMPARISON:  Radiograph and CT 10/03/2022 FINDINGS: Left chest port remains in place. Emphysema with upper lobe scarring. No acute airspace disease. Stable heart size and mediastinal contours. No  pleural fluid or pneumothorax. Presumed telemetry lead on the right. IMPRESSION: 1. No acute abnormality. 2. Emphysema with upper lobe scarring, unchanged from recent chest CT. Electronically Signed   By: Keith Rake M.D.   On: 10/07/2022 19:47   CT Angio Chest PE W and/or Wo Contrast  Result Date: 10/03/2022 CLINICAL DATA:  Shortness of breath and dizzy EXAM: CT ANGIOGRAPHY CHEST WITH CONTRAST TECHNIQUE: Multidetector CT imaging of the chest was performed using the standard protocol during bolus administration of intravenous contrast. Multiplanar CT image reconstructions and MIPs were obtained to evaluate the vascular anatomy. RADIATION DOSE REDUCTION: This exam was performed according to the departmental dose-optimization program which includes automated exposure control, adjustment of the mA and/or kV according to patient size and/or use of iterative reconstruction technique. CONTRAST:  29m OMNIPAQUE IOHEXOL 350 MG/ML SOLN COMPARISON:  Chest x-ray 10/03/2022, CT chest 07/31/2022 FINDINGS: Cardiovascular: Satisfactory opacification of the pulmonary arteries to the segmental level. No evidence of pulmonary embolism. Non aneurysmal aorta. Moderate aortic atherosclerosis. No dissection is seen. Normal cardiac size. No pericardial effusion. Mediastinum/Nodes: Midline trachea. No thyroid mass. Stable subcentimeter mediastinal lymph nodes. Small hilar nodes. Esophagus within normal limits. Small hiatal hernia. Lungs/Pleura: Emphysema. No acute airspace disease. No enlarging pulmonary nodules. Stable scattered clustered central lobular nodules. Mild lower and upper lobe bronchiectasis and bronchial wall thickening. Upper Abdomen: No acute finding Musculoskeletal: No acute osseous abnormality. Mild compression deformity of T8, stable since September. Review of the MIP images confirms the above findings. IMPRESSION: 1. Negative for acute pulmonary embolus or aortic dissection. 2. Emphysema. No acute airspace  disease. Mild bronchiectasis and bronchial wall thickening with scattered clustered central lobular nodules, stable to recent priors. No enlarging pulmonary nodules 3. Aortic atherosclerosis. Aortic Atherosclerosis (ICD10-I70.0) and Emphysema (ICD10-J43.9). Electronically Signed   By: KDonavan FoilM.D.   On: 10/03/2022 20:40   CT Head Wo Contrast  Result Date: 10/03/2022 CLINICAL DATA:  Shortness of breath, dizziness EXAM: CT HEAD WITHOUT CONTRAST TECHNIQUE: Contiguous axial images were obtained from the base of the skull through the vertex without intravenous contrast. RADIATION DOSE REDUCTION: This exam was performed according to the departmental dose-optimization program which includes automated exposure control, adjustment of the mA and/or kV according to patient size and/or use of iterative reconstruction technique. COMPARISON:  01/28/2022 FINDINGS: Brain: No acute intracranial abnormality. Specifically, no hemorrhage, hydrocephalus, mass lesion, acute infarction, or significant intracranial injury. Vascular: No hyperdense vessel or unexpected calcification. Skull: No acute calvarial abnormality. Sinuses/Orbits: No acute findings Other: None IMPRESSION: No acute intracranial abnormality. Electronically Signed   By: KRolm BaptiseM.D.   On: 10/03/2022 20:22   DG Chest 2 View  Result Date: 10/03/2022 CLINICAL DATA:  Shortness of breath and dizziness for 2 weeks. Cough with productive green mucus for 2 days. History of COPD. EXAM: CHEST - 2 VIEW COMPARISON:  Chest two views 07/31/2022 and 07/17/2022 FINDINGS: Left chest wall  porta catheter tip overlies the superior vena cava/right atrial junction. Cardiac silhouette is normal in size. Moderate calcifications within the aortic arch. Findings the diaphragms and moderate hyperinflation. Increased lucencies within the upper lungs consistent with the emphysematous changes better seen on prior CT. Mild left costophrenic angle scarring is unchanged from  multiple prior radiographs. No new focal airspace opacity. No pleural effusion or pneumothorax. Mild multilevel degenerative disc changes of the thoracic spine. IMPRESSION: 1. No active cardiopulmonary disease. 2. COPD. 3. Aortic atherosclerosis. 4. Mild left costophrenic angle scarring, unchanged. Electronically Signed   By: Yvonne Kendall M.D.   On: 10/03/2022 18:01     ASSESSMENT:  1.  Peritoneal carcinomatosis: -Presentation to the ER with right lower quadrant abdominal pain on and off for 1 month. -CTAP on 05/29/2020 showed extensive nodularity throughout the omentum and upper peritoneal cavity.  Both ovaries are prominent although no well-defined mass is noted.  Small volume ascites. -CA-125 is elevated at 2407.  CEA was 2.4. -CT of the chest shows 11 mm right paratracheal lymph node and 11 mm short axis precarinal lymph node.  Subcarinal lymphadenopathy measuring 14 mm short axis.  This is suspicious for metastatic disease. 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 -Cycle 1 of carboplatin and paclitaxel on 07/05/2020. -Evaluated by Dr. Denman George on 07/07/2020.  Reevaluation after 3 cycles. -CT CAP from 09/01/2020 showed mild improvement with reduction in the omental caking.  Stable to minimally reduced adenopathy in the chest and abdomen.  New 4 mm right upper lobe lung nodule could be inflammatory. -CT CAP on 11/07/2019 and after 6 cycles showed improved peritoneal disease/omental caking.  9 mm short axis portacaval lymph node and adjacent upper abdominal lymph nodes measuring 12 mm in short axis.  Small retroperitoneal lymph nodes up to 5 mm improved.  Peritoneal disease/omental caking now measures 2.4 cm in thickness. -CA-125 on 10/18/2020 improved 52. -Plan for surgical resection after 6 cycles.  Unfortunately she was not cleared for surgery by  anesthesia/pulmonary. - CT CAP on 02/12/2021 showed peritoneal disease and abdominal lymph nodes have improved.  However new masslike areas in the left upper lobe, new since January 31 CT scan along with borderline enlargement of the left supraclavicular lymph node. - She underwent bronchoscopy and biopsy on 03/09/2021 which was benign. - Niraparib 200 mg daily started around 05/25/2021. - Germline mutation testing was negative. - Hospitalization from 10/03/2022 through 10/10/2022 with severe thrombocytopenia, anemia and leukopenia.  She received 1 unit of PRBC and G-CSF daily.  Niraparib held around 10/07/2022.   2.  COPD: -Quit smoking 6 years ago.  Smoked 2 packs/day for 25-30 years. -Has been on 3 L/min oxygen via nasal cannula for the last 5 years.   3.  Family history: -Paternal grandmother with colon cancer.   PLAN:  1.  High-grade serous ovarian carcinoma: - She was recently hospitalized and niraparib was held on 10/15/2022 due to severe cytopenias. - Reviewed labs today which showed normal LFTs.  CBC shows platelet count improved to 251.  Hemoglobin is 9.9 and white count is 7.4. - We will continue to hold niraparib at this time. - She has surveillance scans and Ca1 25 scheduled in 2 weeks.  She will follow-up with me after that.    2.  Normocytic anemia: - Hemoglobin is 9.9 while she is off of niraparib.   3.  Peripheral neuropathy: - Continue primidone.  4.  Severe COPD: - Continue steroid and bronchodilators.  She is now following up with Dr. [name].   5.  Hypomagnesemia: - Continue magnesium supplements twice daily.   Orders placed this encounter:  Orders Placed This Encounter  Procedures   CBC with Differential      Derek Jack, MD [place] [phone]

## 2022-10-14 NOTE — Progress Notes (Signed)
Nahal Wanless was contacted by telephone to verify understanding of discharge instructions status post their most recent discharge from the hospital on the date:  10/10/22.  Inpatient discharge AVS was re-reviewed with patient, along with cancer center appointments.  Verification of understanding for oncology specific follow-up was validated using the Teach Back method.    Transportation to appointments were confirmed for the patient as being self/caregiver.  Sheccid Hammac's questions were addressed to their satisfaction upon completion of this post discharge follow-up call for outpatient oncology.

## 2022-10-17 ENCOUNTER — Other Ambulatory Visit (HOSPITAL_COMMUNITY): Payer: Self-pay

## 2022-10-28 ENCOUNTER — Inpatient Hospital Stay: Payer: Medicare HMO | Attending: Hematology

## 2022-10-28 ENCOUNTER — Inpatient Hospital Stay (HOSPITAL_COMMUNITY)
Admission: EM | Admit: 2022-10-28 | Discharge: 2022-11-08 | DRG: 871 | Disposition: A | Discharge status: Home / Self Care | Payer: Medicare HMO | Attending: Family Medicine | Admitting: Family Medicine

## 2022-10-28 ENCOUNTER — Emergency Department (HOSPITAL_COMMUNITY): Payer: Medicare HMO

## 2022-10-28 ENCOUNTER — Other Ambulatory Visit: Payer: Self-pay

## 2022-10-28 ENCOUNTER — Ambulatory Visit (HOSPITAL_COMMUNITY)
Admission: RE | Admit: 2022-10-28 | Discharge: 2022-10-28 | Disposition: A | Discharge status: Home / Self Care | Payer: Medicare HMO | Source: Ambulatory Visit | Attending: Hematology | Admitting: Hematology

## 2022-10-28 ENCOUNTER — Encounter (HOSPITAL_COMMUNITY): Payer: Self-pay

## 2022-10-28 DIAGNOSIS — Z8543 Personal history of malignant neoplasm of ovary: Secondary | ICD-10-CM

## 2022-10-28 DIAGNOSIS — Z87891 Personal history of nicotine dependence: Secondary | ICD-10-CM | POA: Diagnosis not present

## 2022-10-28 DIAGNOSIS — Z8249 Family history of ischemic heart disease and other diseases of the circulatory system: Secondary | ICD-10-CM | POA: Diagnosis not present

## 2022-10-28 DIAGNOSIS — J441 Chronic obstructive pulmonary disease with (acute) exacerbation: Secondary | ICD-10-CM | POA: Diagnosis present

## 2022-10-28 DIAGNOSIS — Z7951 Long term (current) use of inhaled steroids: Secondary | ICD-10-CM | POA: Diagnosis not present

## 2022-10-28 DIAGNOSIS — E876 Hypokalemia: Secondary | ICD-10-CM | POA: Diagnosis present

## 2022-10-28 DIAGNOSIS — Z9981 Dependence on supplemental oxygen: Secondary | ICD-10-CM

## 2022-10-28 DIAGNOSIS — Z7982 Long term (current) use of aspirin: Secondary | ICD-10-CM | POA: Diagnosis not present

## 2022-10-28 DIAGNOSIS — J189 Pneumonia, unspecified organism: Secondary | ICD-10-CM | POA: Diagnosis not present

## 2022-10-28 DIAGNOSIS — Z85028 Personal history of other malignant neoplasm of stomach: Secondary | ICD-10-CM

## 2022-10-28 DIAGNOSIS — K219 Gastro-esophageal reflux disease without esophagitis: Secondary | ICD-10-CM | POA: Insufficient documentation

## 2022-10-28 DIAGNOSIS — G250 Essential tremor: Secondary | ICD-10-CM | POA: Diagnosis not present

## 2022-10-28 DIAGNOSIS — Z820 Family history of epilepsy and other diseases of the nervous system: Secondary | ICD-10-CM

## 2022-10-28 DIAGNOSIS — A419 Sepsis, unspecified organism: Secondary | ICD-10-CM | POA: Diagnosis not present

## 2022-10-28 DIAGNOSIS — E871 Hypo-osmolality and hyponatremia: Secondary | ICD-10-CM | POA: Diagnosis present

## 2022-10-28 DIAGNOSIS — I739 Peripheral vascular disease, unspecified: Secondary | ICD-10-CM | POA: Diagnosis present

## 2022-10-28 DIAGNOSIS — R6520 Severe sepsis without septic shock: Secondary | ICD-10-CM | POA: Diagnosis present

## 2022-10-28 DIAGNOSIS — I700 Atherosclerosis of aorta: Secondary | ICD-10-CM | POA: Diagnosis present

## 2022-10-28 DIAGNOSIS — E7800 Pure hypercholesterolemia, unspecified: Secondary | ICD-10-CM | POA: Diagnosis present

## 2022-10-28 DIAGNOSIS — J9621 Acute and chronic respiratory failure with hypoxia: Secondary | ICD-10-CM | POA: Diagnosis present

## 2022-10-28 DIAGNOSIS — Z825 Family history of asthma and other chronic lower respiratory diseases: Secondary | ICD-10-CM

## 2022-10-28 DIAGNOSIS — Z1152 Encounter for screening for COVID-19: Secondary | ICD-10-CM

## 2022-10-28 DIAGNOSIS — J439 Emphysema, unspecified: Secondary | ICD-10-CM | POA: Diagnosis present

## 2022-10-28 DIAGNOSIS — J9622 Acute and chronic respiratory failure with hypercapnia: Secondary | ICD-10-CM | POA: Diagnosis present

## 2022-10-28 DIAGNOSIS — C786 Secondary malignant neoplasm of retroperitoneum and peritoneum: Secondary | ICD-10-CM

## 2022-10-28 DIAGNOSIS — R0602 Shortness of breath: Secondary | ICD-10-CM | POA: Diagnosis not present

## 2022-10-28 DIAGNOSIS — G629 Polyneuropathy, unspecified: Secondary | ICD-10-CM

## 2022-10-28 DIAGNOSIS — I6523 Occlusion and stenosis of bilateral carotid arteries: Secondary | ICD-10-CM | POA: Diagnosis present

## 2022-10-28 DIAGNOSIS — Z79899 Other long term (current) drug therapy: Secondary | ICD-10-CM | POA: Diagnosis not present

## 2022-10-28 DIAGNOSIS — J440 Chronic obstructive pulmonary disease with acute lower respiratory infection: Secondary | ICD-10-CM | POA: Diagnosis present

## 2022-10-28 DIAGNOSIS — E785 Hyperlipidemia, unspecified: Secondary | ICD-10-CM | POA: Diagnosis not present

## 2022-10-28 DIAGNOSIS — J121 Respiratory syncytial virus pneumonia: Secondary | ICD-10-CM

## 2022-10-28 DIAGNOSIS — M1990 Unspecified osteoarthritis, unspecified site: Secondary | ICD-10-CM | POA: Diagnosis present

## 2022-10-28 DIAGNOSIS — I10 Essential (primary) hypertension: Secondary | ICD-10-CM | POA: Diagnosis present

## 2022-10-28 DIAGNOSIS — A4189 Other specified sepsis: Secondary | ICD-10-CM | POA: Diagnosis not present

## 2022-10-28 LAB — CBC WITH DIFFERENTIAL/PLATELET
Abs Immature Granulocytes: 0.08 10*3/uL — ABNORMAL HIGH (ref 0.00–0.07)
Basophils Absolute: 0.1 10*3/uL (ref 0.0–0.1)
Basophils Relative: 1 %
Eosinophils Absolute: 0.2 10*3/uL (ref 0.0–0.5)
Eosinophils Relative: 2 %
HCT: 30.7 % — ABNORMAL LOW (ref 36.0–46.0)
Hemoglobin: 10.1 g/dL — ABNORMAL LOW (ref 12.0–15.0)
Immature Granulocytes: 1 %
Lymphocytes Relative: 7 %
Lymphs Abs: 0.7 10*3/uL (ref 0.7–4.0)
MCH: 32.6 pg (ref 26.0–34.0)
MCHC: 32.9 g/dL (ref 30.0–36.0)
MCV: 99 fL (ref 80.0–100.0)
Monocytes Absolute: 0.8 10*3/uL (ref 0.1–1.0)
Monocytes Relative: 8 %
Neutro Abs: 8.1 10*3/uL — ABNORMAL HIGH (ref 1.7–7.7)
Neutrophils Relative %: 81 %
Platelets: 171 10*3/uL (ref 150–400)
RBC: 3.1 MIL/uL — ABNORMAL LOW (ref 3.87–5.11)
RDW: 16.9 % — ABNORMAL HIGH (ref 11.5–15.5)
WBC: 9.9 10*3/uL (ref 4.0–10.5)
nRBC: 0 % (ref 0.0–0.2)

## 2022-10-28 LAB — RESP PANEL BY RT-PCR (RSV, FLU A&B, COVID)  RVPGX2
Influenza A by PCR: NEGATIVE
Influenza B by PCR: NEGATIVE
Resp Syncytial Virus by PCR: POSITIVE — AB
SARS Coronavirus 2 by RT PCR: NEGATIVE

## 2022-10-28 LAB — COMPREHENSIVE METABOLIC PANEL
ALT: 14 U/L (ref 0–44)
AST: 24 U/L (ref 15–41)
Albumin: 3.4 g/dL — ABNORMAL LOW (ref 3.5–5.0)
Alkaline Phosphatase: 93 U/L (ref 38–126)
Anion gap: 11 (ref 5–15)
BUN: 7 mg/dL — ABNORMAL LOW (ref 8–23)
CO2: 31 mmol/L (ref 22–32)
Calcium: 8.3 mg/dL — ABNORMAL LOW (ref 8.9–10.3)
Chloride: 92 mmol/L — ABNORMAL LOW (ref 98–111)
Creatinine, Ser: 0.51 mg/dL (ref 0.44–1.00)
GFR, Estimated: >60 mL/min (ref >60)
Glucose, Bld: 114 mg/dL — ABNORMAL HIGH (ref 70–99)
Potassium: 4.1 mmol/L (ref 3.5–5.1)
Sodium: 134 mmol/L — ABNORMAL LOW (ref 135–145)
Total Bilirubin: 0.4 mg/dL (ref 0.3–1.2)
Total Protein: 6.4 g/dL — ABNORMAL LOW (ref 6.5–8.1)

## 2022-10-28 LAB — BLOOD GAS, VENOUS
Acid-Base Excess: 11.4 mmol/L — ABNORMAL HIGH (ref 0.0–2.0)
Bicarbonate: 35.9 mmol/L — ABNORMAL HIGH (ref 20.0–28.0)
Drawn by: 1528
O2 Saturation: 82.6 %
Patient temperature: 37.1
pCO2, Ven: 45 mmHg (ref 44–60)
pH, Ven: 7.51 — ABNORMAL HIGH (ref 7.25–7.43)
pO2, Ven: 47 mmHg — ABNORMAL HIGH (ref 32–45)

## 2022-10-28 LAB — LACTIC ACID, PLASMA
Lactic Acid, Venous: 1.8 mmol/L (ref 0.5–1.9)
Lactic Acid, Venous: 1.2 mmol/L (ref 0.5–1.9)

## 2022-10-28 MED ORDER — SODIUM CHLORIDE 0.9 % IV SOLN
1.0000 g | Freq: Once | INTRAVENOUS | Status: AC
  Administered 2022-10-28: 1 g via INTRAVENOUS
  Filled 2022-10-28: qty 10

## 2022-10-28 MED ORDER — IBUPROFEN 400 MG PO TABS
600.0000 mg | ORAL_TABLET | Freq: Once | ORAL | Status: AC
  Administered 2022-10-28: 600 mg via ORAL
  Filled 2022-10-28: qty 2

## 2022-10-28 MED ORDER — SODIUM CHLORIDE 0.9 % IV SOLN
500.0000 mg | INTRAVENOUS | Status: DC
  Administered 2022-10-29 (×2): 500 mg via INTRAVENOUS
  Filled 2022-10-28 (×2): qty 5

## 2022-10-28 MED ORDER — SODIUM CHLORIDE 0.9 % IV BOLUS
1000.0000 mL | Freq: Once | INTRAVENOUS | Status: AC
  Administered 2022-10-28: 1000 mL via INTRAVENOUS

## 2022-10-28 MED ORDER — DEXTROSE 5 % IV SOLN: 250.0000 mg | INTRAVENOUS | Status: DC

## 2022-10-28 MED ORDER — IPRATROPIUM-ALBUTEROL 0.5-2.5 (3) MG/3ML IN SOLN
3.0000 mL | Freq: Once | RESPIRATORY_TRACT | Status: AC
  Administered 2022-10-28: 3 mL via RESPIRATORY_TRACT
  Filled 2022-10-28: qty 3

## 2022-10-28 NOTE — ED Provider Notes (Signed)
[facility] EMERGENCY DEPARTMENT Provider Note   CSN: [id number] Arrival date & time: 10/28/22  1551     History  Chief Complaint  Patient presents with   Shortness of Breath        Cough    [name] is a 66 y.o. female.  Patient presents to the hospital complaining of worsening shortness of breath and cough for the past few days.  She reports a temperature of 13F.  Patient is on 3 L/min oxygen at baseline.  She states she was diagnosed with pneumonia 2 weeks ago and was placed on antibiotics.  Those were completed and her pulmonologist reportedly called in more that she was post to start today but she has yet to start.  Patient denies chest pain, abdominal pain, nausea, vomiting.  Past medical history significant for COPD, hypertension, history of respiratory failure with hypoxia, community-acquired pneumonia, ovarian carcinoma, pancytopenia  HPI     Home Medications Prior to Admission medications   Medication Sig Start Date End Date Taking? Authorizing Provider  albuterol (VENTOLIN HFA) 108 (90 Base) MCG/ACT inhaler Inhale 2 puffs into the lungs every 6 (six) hours as needed for wheezing or shortness of breath. 07/01/22  Yes Emokpae, Courage, MD  amLODipine (NORVASC) 5 MG tablet Take 5 mg by mouth at bedtime.    Yes [provider]  aspirin EC 81 MG tablet Take 1 tablet (81 mg total) by mouth daily with breakfast. 07/01/22  Yes Emokpae, Courage, MD  atorvastatin (LIPITOR) 40 MG tablet Take 1 tablet (40 mg total) by mouth daily. 01/02/22  Yes Noralee Stain, DO  Budeson-Glycopyrrol-Formoterol (BREZTRI AEROSPHERE) 160-9-4.8 MCG/ACT AERO Inhale 2 puffs into the lungs 2 (two) times daily. 09/10/22  Yes Nyoka Cowden, MD  ferrous sulfate 325 (65 FE) MG tablet Take 325 mg by mouth every other day.   Yes [provider]  gabapentin (NEURONTIN) 600 MG tablet Take 1 tablet by mouth 3 (three) times daily. 10/17/22  Yes [provider]  ipratropium (ATROVENT  HFA) 17 MCG/ACT inhaler Inhale 2 puffs into the lungs every 6 (six) hours as needed for wheezing.   Yes [provider]  levalbuterol (XOPENEX) 1.25 MG/3ML nebulizer solution Take 1.25 mg by nebulization every 6 (six) hours as needed for shortness of breath or wheezing. 09/28/21  Yes [provider]  magnesium oxide (MAG-OX) 400 (240 Mg) MG tablet Take 1 tablet (400 mg total) by mouth 2 (two) times daily. 09/06/22  Yes Doreatha Massed, MD  moxifloxacin (AVELOX) 400 MG tablet Take 400 mg by mouth daily. 10/28/22  Yes [provider]  nortriptyline (PAMELOR) 25 MG capsule Take 25 mg by mouth at bedtime.   Yes [provider]  OXYGEN Inhale 3 L into the lungs continuous.   Yes [provider]  pantoprazole (PROTONIX) 40 MG tablet Take 1 tablet (40 mg total) by mouth 2 (two) times daily. 07/01/22 10/28/22 Yes Emokpae, Courage, MD  predniSONE (DELTASONE) 5 MG tablet Take 5 mg by mouth daily with breakfast.   Yes [provider]  primidone (MYSOLINE) 50 MG tablet Take 50 mg by mouth at bedtime. 01/30/22  Yes [provider]  nystatin (MYCOSTATIN) 100000 UNIT/ML suspension Take by mouth. Patient not taking: Reported on 10/28/2022 09/24/22   [provider]      Allergies    Patient has no known allergies.    Review of Systems   Review of Systems  Respiratory:  Positive for cough, shortness of breath and wheezing.  Cardiovascular:  Negative for chest pain and leg swelling.  Gastrointestinal:  Negative for abdominal pain, nausea and vomiting.  Genitourinary:  Negative for dysuria.  Neurological:  Negative for syncope and headaches.    Physical Exam Updated Vital Signs BP 131/73   Pulse (!) 111   Temp 98.8 F (37.1 C) (Oral)   Resp (!) 21   Ht 5' (1.524 m)   Wt 51.7 kg   SpO2 96%   BMI 22.26 kg/m  Physical Exam Vitals and nursing note reviewed.  Constitutional:      General: She is not in acute distress.     Appearance: She is well-developed.  HENT:     Head: Normocephalic and atraumatic.  Eyes:     Conjunctiva/sclera: Conjunctivae normal.  Cardiovascular:     Rate and Rhythm: Regular rhythm. Tachycardia present.     Heart sounds: No murmur heard. Pulmonary:     Effort: Pulmonary effort is normal. Tachypnea present. No respiratory distress.     Breath sounds: Examination of the left-lower field reveals decreased breath sounds. Decreased breath sounds and wheezing present.  Abdominal:     Palpations: Abdomen is soft.     Tenderness: There is no abdominal tenderness.  Musculoskeletal:        General: No swelling.     Cervical back: Neck supple.  Skin:    General: Skin is warm and dry.     Capillary Refill: Capillary refill takes less than 2 seconds.  Neurological:     Mental Status: She is alert.  Psychiatric:        Mood and Affect: Mood normal.     ED Results / Procedures / Treatments   Labs (all labs ordered are listed, but only abnormal results are displayed) Labs Reviewed  RESP PANEL BY RT-PCR (RSV, FLU A&B, COVID)  RVPGX2 - Abnormal; Notable for the following components:      Result Value   Resp Syncytial Virus by PCR POSITIVE (*)    All other components within normal limits  COMPREHENSIVE METABOLIC PANEL - Abnormal; Notable for the following components:   Sodium 134 (*)    Chloride 92 (*)    Glucose, Bld 114 (*)    BUN 7 (*)    Calcium 8.3 (*)    Total Protein 6.4 (*)    Albumin 3.4 (*)    All other components within normal limits  CBC WITH DIFFERENTIAL/PLATELET - Abnormal; Notable for the following components:   RBC 3.10 (*)    Hemoglobin 10.1 (*)    HCT 30.7 (*)    RDW 16.9 (*)    Neutro Abs 8.1 (*)    Abs Immature Granulocytes 0.08 (*)    All other components within normal limits  CULTURE, BLOOD (ROUTINE X 2)  CULTURE, BLOOD (ROUTINE X 2) W REFLEX TO ID PANEL  LACTIC ACID, PLASMA  LACTIC ACID, PLASMA  URINALYSIS, ROUTINE W REFLEX MICROSCOPIC  BLOOD GAS,  VENOUS    EKG None  Radiology DG Chest 2 View  Result Date: 10/28/2022 CLINICAL DATA:  Shortness of breath EXAM: CHEST - 2 VIEW COMPARISON:  Previous studies including the examination of 10/07/2022 FINDINGS: Cardiac size is within normal limits. Increase in AP diameter of chest suggests COPD. Increased interstitial markings are seen in left lower lung field. There is ectasia of bronchi in the upper lung fields. Emphysematous changes are noted in the upper lung fields. There is no significant pleural effusion or pneumothorax. There is left subclavian central venous catheter with its tip  in superior vena cava. IMPRESSION: COPD. Bronchiectasis. Increased interstitial markings in the left lower lung fields may suggest scarring or interstitial pneumonia superimposed over fibrosis. Electronically Signed   By: Ernie Avena M.D.   On: 10/28/2022 16:56    Procedures Procedures    Medications Ordered in ED Medications  ipratropium-albuterol (DUONEB) 0.5-2.5 (3) MG/3ML nebulizer solution 3 mL (has no administration in time range)  sodium chloride 0.9 % bolus 1,000 mL (has no administration in time range)  cefTRIAXone (ROCEPHIN) 1 g in sodium chloride 0.9 % 100 mL IVPB (has no administration in time range)  azithromycin (ZITHROMAX) 250 mg in dextrose 5 % 125 mL IVPB (has no administration in time range)  ibuprofen (ADVIL) tablet 600 mg (600 mg Oral Given 10/28/22 2215)    ED Course/ Medical Decision Making/ A&P                           Medical Decision Making Amount and/or Complexity of Data Reviewed Labs: ordered. Radiology: ordered.  Risk Prescription drug management.   This patient presents to the ED for concern of shortness of breath, this involves an extensive number of treatment options, and is a complaint that carries with it a high risk of complications and morbidity.  The differential diagnosis includes pneumonia, COPD exacerbation, heart failure, and others   Co  morbidities that complicate the patient evaluation  Current diagnosis of community-acquired pneumonia, COPD   Additional history obtained:  Additional history obtained from friend at bedside   Lab Tests:  I Ordered, and personally interpreted labs.  The pertinent results include: Positive RSV, hemoglobin 10.1   Imaging Studies ordered:  I ordered imaging studies including chest x-ray I independently visualized and interpreted imaging which showed COPD. Bronchiectasis. Increased interstitial markings in the left lower lung fields may suggest scarring or interstitial pneumonia superimposed over fibrosis.  I agree with the radiologist interpretation   Consultations Obtained:  I requested consultation with the hospitalist, Dr.Mansy and discussed lab and imaging findings as well as pertinent plan - they recommend: admission   Problem List / ED Course / Critical interventions / Medication management   I ordered medication including azithromycin and Rocephin for community-acquired pneumonia, DuoNeb for her wheezing, ibuprofen for pain/fever I have reviewed the patients home medicines and have made adjustments as needed   Test / Admission - Considered:  Patient appears to have a continuation of her pneumonia combined with COPD exacerbation.  Patient also tested positive today for RSV.  Patient would benefit from admission for further management of her respiratory failure.        Final Clinical Impression(s) / ED Diagnoses Final diagnoses:  COPD exacerbation (HCC)  Community acquired pneumonia of left lower lobe of lung  RSV (respiratory syncytial virus pneumonia)    Rx / DC Orders ED Discharge Orders     None         Pamala Duffel 10/28/22 2319    Gloris Manchester, MD 10/29/22 [phone]

## 2022-10-28 NOTE — ED Triage Notes (Signed)
Pt presents with increasing ShOB and cough for a few days. Pt reports elevated temp of 99.0. Pt on 3L O2 at baseline. Pt states she was dx with pneumonia 2 weeks ago and placed on abx. Pt completed that course and states her pulmonologist called her in more that she is supposed to start today.

## 2022-10-28 NOTE — H&P (Signed)
[name]   PATIENT NAME: [name]    MR#:  [id number]  DATE OF BIRTH:  1956/03/17  DATE OF ADMISSION:  10/28/2022  PRIMARY CARE PHYSICIAN: Vicki Richards, MD   Patient is coming from: Home  REQUESTING/REFERRING PHYSICIAN: Ronny Boyd   CHIEF COMPLAINT:   Chief Complaint  Patient presents with   Shortness of Breath        Cough    HISTORY OF PRESENT ILLNESS:  [name] is a 66 y.o. Caucasian female with medical history significant for anemia, asthma, carotid stenosis, COPD, hypertension, dyslipidemia, peripheral neuropathy, peripheral vascular disease and emphysema, who presented to the emergency room with acute onset of worsening dyspnea over the last couple of days that got remarkably worse today with associated cough productive of greenish sputum as well as wheezing.  She admits to fever that was up to 100.4 with associated chills.  She has been having mild headache without dizziness or blurred vision.  No nausea or vomiting or diarrhea.  No sore throat or earache.  No dysuria, oliguria or hematuria or flank pain.  She quit smoking cigarettes 10 years ago.  ED Course: When he came to the ER, heart rate was 115 with respiratory rate of 22 and otherwise normal vital signs.  Labs revealed a VBG with pH 7.51 and HCO3 of 35.9.  Lactic acid was 1.8 and later 1.2.  RSV came back positive and influenza a and B antigens as well as COVID-19 PCR came back negative.  UA was negative.  CMP revealed mild hyponatremia 134 and hypokalemia of 92.  Albumin was 3.4 with total protein of 6.4.  CBC showed anemia better than previous levels. EKG as reviewed by me : EKG showed sinus tachycardia with a rate of 123 with premature atrial complexes and right axis deviation Imaging: Two-view chest x-ray showed the following: COPD. Bronchiectasis. Increased interstitial markings in the left lower lung fields may suggest scarring or interstitial pneumonia superimposed over  fibrosis.  The patient was given 60 mg p.o. Advil, DuoNeb, 1 L bolus of IV normal saline, IV Rocephin and Zithromax.  She will be admitted to a medical telemetry bed for further evaluation and management.  PAST MEDICAL HISTORY:   Past Medical History:  Diagnosis Date   Anemia    Aortic atherosclerosis (HCC)    Arthritis    Asthma    Bilateral carotid artery stenosis    Cancer (HCC)    Gastric Cancer   COPD (chronic obstructive pulmonary disease) (HCC)    High cholesterol    Hyperlipidemia    Hypertension    Neuropathy    Osteoarthritis    Ovarian cancer ([place])    Oxygen dependent    Peripheral vascular disease ([place])    Peritoneal carcinoma ([place])    Pneumonia 2019   Port-A-Cath in place 07/03/2020   Pulmonary emphysema ([place])     PAST SURGICAL HISTORY:   Past Surgical History:  Procedure Laterality Date   BIOPSY  06/30/2022   Procedure: BIOPSY;  Surgeon: Eloise Harman, DO;  [facility];  Service: Endoscopy;;  gastric   BIOPSY  09/03/2022   Procedure: BIOPSY;  Surgeon: Harvel Quale, MD;  [facility];  Service: Gastroenterology;;   COLONOSCOPY WITH PROPOFOL N/A 09/03/2022   Procedure: COLONOSCOPY WITH PROPOFOL;  Surgeon: Harvel Quale, MD;  [facility];  Service: Gastroenterology;  Laterality: N/A;  900 ASA 3   ESOPHAGEAL BRUSHING  09/03/2022   Procedure: ESOPHAGEAL BRUSHING;  Surgeon: Montez Morita, Quillian Quince, MD;  [facility];  Service: Gastroenterology;;   ESOPHAGOGASTRODUODENOSCOPY (EGD) WITH PROPOFOL N/A 02/17/2021   Procedure: ESOPHAGOGASTRODUODENOSCOPY (EGD) WITH PROPOFOL;  Surgeon: Harvel Quale, MD;  [facility];  Service: Gastroenterology;  Laterality: N/A;   ESOPHAGOGASTRODUODENOSCOPY (EGD) WITH PROPOFOL N/A 05/04/2022   Procedure: ESOPHAGOGASTRODUODENOSCOPY (EGD) WITH PROPOFOL;  Surgeon: Daneil Dolin, MD;  [facility];  Service: Endoscopy;  Laterality:  N/A;   ESOPHAGOGASTRODUODENOSCOPY (EGD) WITH PROPOFOL N/A 06/30/2022   Procedure: ESOPHAGOGASTRODUODENOSCOPY (EGD) WITH PROPOFOL;  Surgeon: Eloise Harman, DO;  [facility];  Service: Endoscopy;  Laterality: N/A;   ESOPHAGOGASTRODUODENOSCOPY (EGD) WITH PROPOFOL N/A 09/03/2022   Procedure: ESOPHAGOGASTRODUODENOSCOPY (EGD) WITH PROPOFOL;  Surgeon: Harvel Quale, MD;  [facility];  Service: Gastroenterology;  Laterality: N/A;   HALLUX VALGUS BASE WEDGE Right 06/09/2015   Procedure: Base wedge osteotomy with modified McBride right foot ;  Surgeon: Sharlotte Alamo, MD;  Location: [facility];  Service: Podiatry;  Laterality: Right;   PORTACATH PLACEMENT Left 06/28/2020   Procedure: PORT-A-CATHETER PLACEMENT LEFT CHEST (attached catheter in left subclavian);  Surgeon: Virl Cagey, MD;  Location: [facility];  Service: General;  Laterality: Left;   TUBAL LIGATION     VIDEO BRONCHOSCOPY WITH ENDOBRONCHIAL NAVIGATION N/A 03/09/2021   Procedure: VIDEO BRONCHOSCOPY WITH ENDOBRONCHIAL NAVIGATION;  Surgeon: Ottie Glazier, MD;  Location: [facility];  Service: Thoracic;  Laterality: N/A;   VIDEO BRONCHOSCOPY WITH ENDOBRONCHIAL ULTRASOUND N/A 03/09/2021   Procedure: VIDEO BRONCHOSCOPY WITH ENDOBRONCHIAL ULTRASOUND;  Surgeon: Ottie Glazier, MD;  Location: [facility];  Service: Thoracic;  Laterality: N/A;    SOCIAL HISTORY:   Social History   Tobacco Use   Smoking status: Former    Packs/day: 2.00    Years: 30.00    Total pack years: 60.00    Types: Cigarettes    Quit date: 11/04/2013    Years since quitting: 8.9   Smokeless tobacco: Never  Substance Use Topics   Alcohol use: No    FAMILY HISTORY:   Family History  Problem Relation Age of Onset   Alzheimer's disease Mother    COPD Father    Emphysema Father    Hypertension Father    Healthy Sister    Healthy Brother    Alzheimer's disease Maternal Grandmother    Healthy Sister    Healthy Sister    Healthy  Sister    Prostate cancer Other        paternal grandmother's brother; dx in early 74s   Breast cancer Neg Hx     DRUG ALLERGIES:  No Known Allergies  REVIEW OF SYSTEMS:   ROS As per history of present illness. All pertinent systems were reviewed above. Constitutional, HEENT, cardiovascular, respiratory, GI, GU, musculoskeletal, neuro, psychiatric, endocrine, integumentary and hematologic systems were reviewed and are otherwise negative/unremarkable except for positive findings mentioned above in the HPI.   MEDICATIONS AT HOME:   Prior to Admission medications   Medication Sig Start Date End Date Taking? Authorizing Provider  albuterol (VENTOLIN HFA) 108 (90 Base) MCG/ACT inhaler Inhale 2 puffs into the lungs every 6 (six) hours as needed for wheezing or shortness of breath. 07/01/22  Yes Emokpae, Courage, MD  amLODipine (NORVASC) 5 MG tablet Take 5 mg by mouth at bedtime.    Yes [provider]  aspirin EC 81 MG tablet Take 1 tablet (81 mg total) by mouth daily with breakfast. 07/01/22  Yes Emokpae, Courage, MD  atorvastatin (LIPITOR) 40 MG tablet Take 1 tablet (40 mg total) by mouth daily. 01/02/22  Yes Dessa Phi, DO  Budeson-Glycopyrrol-Formoterol (BREZTRI AEROSPHERE) 160-9-4.8 MCG/ACT AERO Inhale 2 puffs into the lungs 2 (two) times daily. 09/10/22  Yes Tanda Rockers, MD  ferrous sulfate 325 (65 FE) MG tablet Take 325 mg by mouth every other day.   Yes [provider]  gabapentin (NEURONTIN) 600 MG tablet Take 1 tablet by mouth 3 (three) times daily. 10/17/22  Yes [provider]  ipratropium (ATROVENT HFA) 17 MCG/ACT inhaler Inhale 2 puffs into the lungs every 6 (six) hours as needed for wheezing.   Yes [provider]  levalbuterol (XOPENEX) 1.25 MG/3ML nebulizer solution Take 1.25 mg by nebulization every 6 (six) hours as needed for shortness of breath or wheezing. 09/28/21  Yes [provider]  magnesium oxide (MAG-OX) 400 (240 Mg)  MG tablet Take 1 tablet (400 mg total) by mouth 2 (two) times daily. 09/06/22  Yes Derek Jack, MD  moxifloxacin (AVELOX) 400 MG tablet Take 400 mg by mouth daily. 10/28/22  Yes [provider]  nortriptyline (PAMELOR) 25 MG capsule Take 25 mg by mouth at bedtime.   Yes [provider]  OXYGEN Inhale 3 L into the lungs continuous.   Yes [provider]  pantoprazole (PROTONIX) 40 MG tablet Take 1 tablet (40 mg total) by mouth 2 (two) times daily. 07/01/22 10/28/22 Yes Emokpae, Courage, MD  predniSONE (DELTASONE) 5 MG tablet Take 5 mg by mouth daily with breakfast.   Yes [provider]  primidone (MYSOLINE) 50 MG tablet Take 50 mg by mouth at bedtime. 01/30/22  Yes [provider]  nystatin (MYCOSTATIN) 100000 UNIT/ML suspension Take by mouth. Patient not taking: Reported on 10/28/2022 09/24/22   [provider]      VITAL SIGNS:  Blood pressure 125/76, pulse (!) 103, temperature 98.8 F (37.1 C), temperature source Oral, resp. rate (!) 22, height 5' (1.524 m), weight 51.7 kg, SpO2 97 %.  PHYSICAL EXAMINATION:  Physical Exam  GENERAL:  66 y.o.-year-old Caucasian female patient lying in the bed with mild respiratory distress with conversational dyspnea EYES: Pupils equal, round, reactive to light and accommodation. No scleral icterus. Extraocular muscles intact.  HEENT: Head atraumatic, normocephalic. Oropharynx and nasopharynx clear.  NECK:  Supple, no jugular venous distention. No thyroid enlargement, no tenderness.  LUNGS: Diminished bibasal breath sounds with left basal crackles, residual expiratory wheezes and rhonchi with diminished expiratory airflow and harsh vesicular breathing. CARDIOVASCULAR: Regular rate and rhythm, S1, S2 normal. No murmurs, rubs, or gallops.  ABDOMEN: Soft, nondistended, nontender. Bowel sounds present. No organomegaly or mass.  EXTREMITIES: No pedal edema, cyanosis, or clubbing.  NEUROLOGIC: Cranial  nerves II through XII are intact. Muscle strength 5/5 in all extremities. Sensation intact. Gait not checked.  PSYCHIATRIC: The patient is alert and oriented x 3.  Normal affect and good eye contact. SKIN: No obvious rash, lesion, or ulcer.   LABORATORY PANEL:   CBC Recent Labs  Lab 10/28/22 1700  WBC 9.9  HGB 10.1*  HCT 30.7*  PLT 171   ------------------------------------------------------------------------------------------------------------------  Chemistries  Recent Labs  Lab 10/28/22 1700  NA 134*  K 4.1  CL 92*  CO2 31  GLUCOSE 114*  BUN 7*  CREATININE 0.51  CALCIUM 8.3*  AST 24  ALT 14  ALKPHOS 93  BILITOT 0.4   ------------------------------------------------------------------------------------------------------------------  Cardiac Enzymes No results for input(s): "TROPONINI" in the last 168  hours. ------------------------------------------------------------------------------------------------------------------  RADIOLOGY:  DG Chest 2 View  Result Date: 10/28/2022 CLINICAL DATA:  Shortness of breath EXAM: CHEST - 2 VIEW COMPARISON:  Previous studies including the examination of 10/07/2022 FINDINGS: Cardiac size is within normal limits. Increase in AP diameter of chest suggests COPD. Increased interstitial markings are seen in left lower lung field. There is ectasia of bronchi in the upper lung fields. Emphysematous changes are noted in the upper lung fields. There is no significant pleural effusion or pneumothorax. There is left subclavian central venous catheter with its tip in superior vena cava. IMPRESSION: COPD. Bronchiectasis. Increased interstitial markings in the left lower lung fields may suggest scarring or interstitial pneumonia superimposed over fibrosis. Electronically Signed   By: Elmer Picker M.D.   On: 10/28/2022 16:56      IMPRESSION AND PLAN:  Assessment and Plan: * COPD exacerbation ([place]) - This is clearly secondary to  community-acquired pneumonia and RSV infection. - The patient will be admitted to a medical telemetry bed. - We will continue bronchodilator therapy with DuoNebs 4 times daily and every 4 hours as needed. - Will continue steroid therapy with IV Solu-Medrol. - IV antibiotics will be given as below. - Mucolytic therapy will be provided.  Sepsis due to pneumonia [facility]) - The patient will be placed on IV Rocephin and Zithromax. - Mucolytic therapy and bronchodilator therapy will be provided. - Will follow blood cultures.  Peripheral neuropathy - We will continue Neurontin.  GERD without esophagitis - We will continue PPI therapy.  Essential tremor - We will continue primidone.  Dyslipidemia - We will continue statin therapy.  Essential hypertension - We will continue her antihypertensives.       DVT prophylaxis: Lovenox.  Advanced Care Planning:  Code Status: full code.  Family Communication:  The plan of care was discussed in details with the patient (and family). I answered all questions. The patient agreed to proceed with the above mentioned plan. Further management will depend upon hospital course. Disposition Plan: Back to previous home environment Consults called: none.  All the records are reviewed and case discussed with ED provider.  Status is: Inpatient   At the time of the admission, it appears that the appropriate admission status for this patient is inpatient.  This is judged to be reasonable and necessary in order to provide the required intensity of service to ensure the patient's safety given the presenting symptoms, physical exam findings and initial radiographic and laboratory data in the context of comorbid conditions.  The patient requires inpatient status due to high intensity of service, high risk of further deterioration and high frequency of surveillance required.  I certify that at the time of admission, it is my clinical judgment that the patient will  require inpatient hospital care extending more than 2 midnights.                            Dispo: The patient is from: Home              Anticipated d/c is to: Home              Patient currently is not medically stable to d/c.              Difficult to place patient: No  Christel Mormon M.D on 10/29/2022 at 1:31 AM  [facility]   From 7 PM-7 AM, contact night-coverage [email or web address]  CC: Primary care physician; Beverlyn Roux  [name], MD

## 2022-10-28 NOTE — Progress Notes (Signed)
PHARMACY NOTE:  ANTIMICROBIAL RENAL DOSAGE ADJUSTMENT  Current antimicrobial regimen includes a mismatch between antimicrobial dosage and estimated renal function.  As per policy approved by the Pharmacy & Therapeutics and Medical Executive Committees, the antimicrobial dosage will be adjusted accordingly.  Current antimicrobial dosage:  Azithromycin '250mg'$  IV q24h  Indication: CAP  Renal Function:  Estimated Creatinine Clearance: 51 mL/min (by C-G formula based on SCr of 0.51 mg/dL).      Antimicrobial dosage has been changed to:  Azithromycin '500mg'$  IV q24h  Thank you for allowing pharmacy to be a part of this patient's care.  Everette Rank, [facility] 10/28/2022 11:24 PM

## 2022-10-28 NOTE — ED Provider Triage Note (Signed)
Emergency Medicine Provider Triage Evaluation Note  [name] , a 66 y.o. female with history of COPD on 3 L O2 continuously, anemia, pneumonia, ovarian cancer hypertension was evaluated in triage.  Pt complains of increasing shortness of breath and cough.  Symptoms present for 2 weeks, gradually worsening for several days.  Endorses chills at home, unsure of fever.  Diagnosed with pneumonia 2 weeks ago and started on antibiotic.  She saw her pulmonologist earlier today and had additional course of antibiotics called in but she has not started the medication yet.  Cough is better productive at times, no improvement with using her oxygen or albuterol.  Intermittent pains of her upper chest as well.  Denies any abdominal pain, leg swelling, nausea or vomiting.  Review of Systems  Positive: Chest pain, cough, shortness of breath Negative: Nausea, vomiting, abdominal pain, leg swelling  Physical Exam  BP 119/69 (BP Location: Left Arm)   Pulse (!) 120   Temp 98.8 F (37.1 C) (Oral)   Resp (!) 26   Ht 5' (1.524 m)   Wt 51.7 kg   SpO2 97%   BMI 22.26 kg/m  Gen:   Awake, uncomfortable appearing Resp:  On 3 L O2, increased work of breathing MSK:   Moves extremities without difficulty  Other:  No peripheral edema  Medical Decision Making  Medically screening exam initiated at 4:37 PM.  Appropriate orders placed.  [name] was informed that the remainder of the evaluation will be completed by another provider, this initial triage assessment does not replace that evaluation, and the importance of remaining in the ED until their evaluation is complete.     Kem Parkinson, PA-C 10/28/22 1648

## 2022-10-29 DIAGNOSIS — J441 Chronic obstructive pulmonary disease with (acute) exacerbation: Secondary | ICD-10-CM | POA: Diagnosis not present

## 2022-10-29 DIAGNOSIS — K219 Gastro-esophageal reflux disease without esophagitis: Secondary | ICD-10-CM | POA: Insufficient documentation

## 2022-10-29 DIAGNOSIS — J121 Respiratory syncytial virus pneumonia: Secondary | ICD-10-CM

## 2022-10-29 DIAGNOSIS — G629 Polyneuropathy, unspecified: Secondary | ICD-10-CM

## 2022-10-29 DIAGNOSIS — E785 Hyperlipidemia, unspecified: Secondary | ICD-10-CM | POA: Insufficient documentation

## 2022-10-29 DIAGNOSIS — A419 Sepsis, unspecified organism: Secondary | ICD-10-CM

## 2022-10-29 DIAGNOSIS — G250 Essential tremor: Secondary | ICD-10-CM | POA: Insufficient documentation

## 2022-10-29 LAB — CBC
HCT: 28.5 % — ABNORMAL LOW (ref 36.0–46.0)
Hemoglobin: 9.1 g/dL — ABNORMAL LOW (ref 12.0–15.0)
MCH: 31.8 pg (ref 26.0–34.0)
MCHC: 31.9 g/dL (ref 30.0–36.0)
MCV: 99.7 fL (ref 80.0–100.0)
Platelets: 144 10*3/uL — ABNORMAL LOW (ref 150–400)
RBC: 2.86 MIL/uL — ABNORMAL LOW (ref 3.87–5.11)
RDW: 17.1 % — ABNORMAL HIGH (ref 11.5–15.5)
WBC: 8.4 10*3/uL (ref 4.0–10.5)
nRBC: 0 % (ref 0.0–0.2)

## 2022-10-29 LAB — URINALYSIS, ROUTINE W REFLEX MICROSCOPIC
Bilirubin Urine: NEGATIVE
Glucose, UA: NEGATIVE mg/dL
Hgb urine dipstick: NEGATIVE
Ketones, ur: NEGATIVE mg/dL
Leukocytes,Ua: NEGATIVE
Nitrite: NEGATIVE
Protein, ur: NEGATIVE mg/dL
Specific Gravity, Urine: 1.012 (ref 1.005–1.030)
pH: 7 (ref 5.0–8.0)

## 2022-10-29 LAB — BASIC METABOLIC PANEL
Anion gap: 10 (ref 5–15)
BUN: 6 mg/dL — ABNORMAL LOW (ref 8–23)
CO2: 28 mmol/L (ref 22–32)
Calcium: 8.1 mg/dL — ABNORMAL LOW (ref 8.9–10.3)
Chloride: 101 mmol/L (ref 98–111)
Creatinine, Ser: 0.51 mg/dL (ref 0.44–1.00)
GFR, Estimated: >60 mL/min (ref >60)
Glucose, Bld: 125 mg/dL — ABNORMAL HIGH (ref 70–99)
Potassium: 3.1 mmol/L — ABNORMAL LOW (ref 3.5–5.1)
Sodium: 139 mmol/L (ref 135–145)

## 2022-10-29 LAB — PROCALCITONIN: Procalcitonin: <0.1 ng/mL

## 2022-10-29 LAB — PROTIME-INR
INR: 0.9 (ref 0.8–1.2)
Prothrombin Time: 12.4 seconds (ref 11.4–15.2)

## 2022-10-29 LAB — MAGNESIUM: Magnesium: 1.7 mg/dL (ref 1.7–2.4)

## 2022-10-29 LAB — CORTISOL-AM, BLOOD: Cortisol - AM: 8.4 ug/dL (ref 6.7–22.6)

## 2022-10-29 MED ORDER — MAGNESIUM HYDROXIDE 400 MG/5ML PO SUSP: 30.0000 mL | Freq: Every day | ORAL | Status: DC | PRN

## 2022-10-29 MED ORDER — IPRATROPIUM-ALBUTEROL 0.5-2.5 (3) MG/3ML IN SOLN
RESPIRATORY_TRACT | Status: AC
  Filled 2022-10-29: qty 3

## 2022-10-29 MED ORDER — IPRATROPIUM-ALBUTEROL 0.5-2.5 (3) MG/3ML IN SOLN
3.0000 mL | Freq: Three times a day (TID) | RESPIRATORY_TRACT | Status: DC
  Administered 2022-10-30 – 2022-11-02 (×10): 3 mL via RESPIRATORY_TRACT
  Filled 2022-10-29 (×10): qty 3

## 2022-10-29 MED ORDER — TRAZODONE HCL 50 MG PO TABS
25.0000 mg | ORAL_TABLET | Freq: Every evening | ORAL | Status: DC | PRN
  Administered 2022-11-01 – 2022-11-07 (×6): 25 mg via ORAL
  Filled 2022-10-29 (×8): qty 1

## 2022-10-29 MED ORDER — ASPIRIN 81 MG PO TBEC
81.0000 mg | DELAYED_RELEASE_TABLET | Freq: Every day | ORAL | Status: DC
  Administered 2022-10-29 – 2022-11-08 (×11): 81 mg via ORAL
  Filled 2022-10-29 (×11): qty 1

## 2022-10-29 MED ORDER — SODIUM CHLORIDE 0.9 % IV SOLN: INTRAVENOUS | Status: DC

## 2022-10-29 MED ORDER — SODIUM CHLORIDE 0.9 % IV SOLN
2.0000 g | INTRAVENOUS | Status: DC
  Administered 2022-10-29: 2 g via INTRAVENOUS
  Filled 2022-10-29: qty 20

## 2022-10-29 MED ORDER — ENOXAPARIN SODIUM 40 MG/0.4ML IJ SOSY
40.0000 mg | PREFILLED_SYRINGE | INTRAMUSCULAR | Status: DC
  Administered 2022-10-29 – 2022-11-08 (×11): 40 mg via SUBCUTANEOUS
  Filled 2022-10-29 (×11): qty 0.4

## 2022-10-29 MED ORDER — ONDANSETRON HCL 4 MG/2ML IJ SOLN
4.0000 mg | Freq: Four times a day (QID) | INTRAMUSCULAR | Status: DC | PRN
  Administered 2022-10-29: 4 mg via INTRAVENOUS
  Filled 2022-10-29: qty 2

## 2022-10-29 MED ORDER — METHYLPREDNISOLONE SODIUM SUCC 40 MG IJ SOLR
40.0000 mg | Freq: Two times a day (BID) | INTRAMUSCULAR | Status: DC
  Administered 2022-10-29 – 2022-10-30 (×2): 40 mg via INTRAVENOUS
  Filled 2022-10-29 (×2): qty 1

## 2022-10-29 MED ORDER — PANTOPRAZOLE SODIUM 40 MG PO TBEC
40.0000 mg | DELAYED_RELEASE_TABLET | Freq: Two times a day (BID) | ORAL | Status: DC
  Administered 2022-10-29 – 2022-11-04 (×14): 40 mg via ORAL
  Filled 2022-10-29 (×14): qty 1

## 2022-10-29 MED ORDER — ONDANSETRON HCL 4 MG PO TABS: 4.0000 mg | ORAL_TABLET | Freq: Four times a day (QID) | ORAL | Status: DC | PRN

## 2022-10-29 MED ORDER — IPRATROPIUM-ALBUTEROL 0.5-2.5 (3) MG/3ML IN SOLN
3.0000 mL | Freq: Four times a day (QID) | RESPIRATORY_TRACT | Status: DC
  Administered 2022-10-29: 3 mL via RESPIRATORY_TRACT

## 2022-10-29 MED ORDER — MAGNESIUM OXIDE -MG SUPPLEMENT 400 (240 MG) MG PO TABS
400.0000 mg | ORAL_TABLET | Freq: Two times a day (BID) | ORAL | Status: DC
  Administered 2022-10-29 – 2022-11-08 (×22): 400 mg via ORAL
  Filled 2022-10-29 (×22): qty 1

## 2022-10-29 MED ORDER — IPRATROPIUM-ALBUTEROL 0.5-2.5 (3) MG/3ML IN SOLN
3.0000 mL | Freq: Four times a day (QID) | RESPIRATORY_TRACT | Status: DC
  Administered 2022-10-29 (×2): 3 mL via RESPIRATORY_TRACT
  Filled 2022-10-29 (×2): qty 3

## 2022-10-29 MED ORDER — POTASSIUM CHLORIDE CRYS ER 20 MEQ PO TBCR
40.0000 meq | EXTENDED_RELEASE_TABLET | Freq: Once | ORAL | Status: AC
  Administered 2022-10-29: 40 meq via ORAL
  Filled 2022-10-29: qty 2

## 2022-10-29 MED ORDER — ACETAMINOPHEN 325 MG PO TABS
650.0000 mg | ORAL_TABLET | Freq: Four times a day (QID) | ORAL | Status: DC | PRN
  Administered 2022-10-30 – 2022-11-05 (×6): 650 mg via ORAL
  Filled 2022-10-29 (×8): qty 2

## 2022-10-29 MED ORDER — METHYLPREDNISOLONE SODIUM SUCC 40 MG IJ SOLR
40.0000 mg | Freq: Three times a day (TID) | INTRAMUSCULAR | Status: DC
  Administered 2022-10-29 (×2): 40 mg via INTRAVENOUS
  Filled 2022-10-29 (×2): qty 1

## 2022-10-29 MED ORDER — ACETAMINOPHEN 650 MG RE SUPP: 650.0000 mg | Freq: Four times a day (QID) | RECTAL | Status: DC | PRN

## 2022-10-29 MED ORDER — PRIMIDONE 50 MG PO TABS
50.0000 mg | ORAL_TABLET | Freq: Every day | ORAL | Status: DC
  Administered 2022-10-29 – 2022-11-07 (×11): 50 mg via ORAL
  Filled 2022-10-29 (×11): qty 1

## 2022-10-29 MED ORDER — SODIUM CHLORIDE 0.9 % IV SOLN: 500.0000 mg | INTRAVENOUS | Status: DC

## 2022-10-29 MED ORDER — AMLODIPINE BESYLATE 5 MG PO TABS
5.0000 mg | ORAL_TABLET | Freq: Every day | ORAL | Status: DC
  Administered 2022-10-29 – 2022-11-07 (×11): 5 mg via ORAL
  Filled 2022-10-29 (×11): qty 1

## 2022-10-29 MED ORDER — FERROUS SULFATE 325 (65 FE) MG PO TABS
325.0000 mg | ORAL_TABLET | ORAL | Status: DC
  Administered 2022-10-29 – 2022-11-08 (×6): 325 mg via ORAL
  Filled 2022-10-29 (×8): qty 1

## 2022-10-29 MED ORDER — GABAPENTIN 300 MG PO CAPS
600.0000 mg | ORAL_CAPSULE | Freq: Three times a day (TID) | ORAL | Status: DC
  Administered 2022-10-29 – 2022-11-08 (×31): 600 mg via ORAL
  Filled 2022-10-29 (×31): qty 2

## 2022-10-29 MED ORDER — ATORVASTATIN CALCIUM 40 MG PO TABS
40.0000 mg | ORAL_TABLET | Freq: Every day | ORAL | Status: DC
  Administered 2022-10-29 – 2022-11-08 (×11): 40 mg via ORAL
  Filled 2022-10-29 (×10): qty 1

## 2022-10-29 MED ORDER — ALBUTEROL SULFATE (2.5 MG/3ML) 0.083% IN NEBU
2.5000 mg | INHALATION_SOLUTION | RESPIRATORY_TRACT | Status: DC | PRN
  Administered 2022-11-01 – 2022-11-03 (×3): 2.5 mg via RESPIRATORY_TRACT
  Filled 2022-10-29 (×3): qty 3

## 2022-10-29 NOTE — TOC Progression Note (Signed)
  Transition of Care [facility]) Screening Note   Patient Details  Name: [name] Date of Birth: 10/03/1956   [facility]) CM/SW Contact:    Boneta Lucks, RN Phone Number: 10/29/2022, 2:02 PM  PT/OT pending  Transition of Care Department [facility]) has reviewed patient and no TOC needs have been identified at this time. We will continue to monitor patient advancement through interdisciplinary progression rounds. If new patient transition needs arise, please place a TOC consult.   Barriers to Discharge: Continued Medical Work up  Expected Discharge Plan and Services      Living arrangements for the past 2 months: Apartment

## 2022-10-29 NOTE — Assessment & Plan Note (Signed)
-   We will continue statin therapy. 

## 2022-10-29 NOTE — Assessment & Plan Note (Signed)
-   The patient will be placed on IV Rocephin and Zithromax. - Mucolytic therapy and bronchodilator therapy will be provided. - Will follow blood cultures.

## 2022-10-29 NOTE — Assessment & Plan Note (Signed)
-   We will continue Neurontin. ?

## 2022-10-29 NOTE — Progress Notes (Signed)
PROGRESS NOTE  [name]  HGD:[id number] DOB: 03/28/1956 DOA: 10/28/2022 PCP: [name], MD   Brief Narrative:  Patient is a 66 year old female with history of anemia, asthma, carotid stenosis, COPD on 3 L 02 at home, hypertension, hyperlipidemia, peripheral neuropathy, peripheral vascular disease who presented to the emergency department with acute onset of dyspnea, greenish sputum, wheezing, fever, chills.  On presentation she was tachycardic.  Found to be RSV positive.  Chest x-ray showed features of COPD, bronchiectasis, increased interstitial markings in the left lower lung field suggesting scarring/interstitial pneumonia.  Patient was admitted for the management of acute COPD exacerbation secondary to RSV and possible pneumonia  Assessment & Plan:  Principal Problem:   COPD exacerbation ([place]) Active Problems:   Sepsis due to pneumonia [facility])   Essential hypertension   Dyslipidemia   Essential tremor   GERD without esophagitis   Peripheral neuropathy   RSV (respiratory syncytial virus pneumonia)   Acute COPD exacerbation: Presented with wheezing, shortness of breath.  On 3 L of oxygen at home.  Currently on 3 L.  She is a past smoker Continue bronchodilators, steroid therapy, mucolytic's.  Sepsis secondary to pneumonia: RSV positive.  Chest x-ray showed possible superimposed pneumonia.  Also started on ceftriaxone, azithromycin .  Follow-up blood cultures.  Acute on chronic hypoxic respiratory failure:On 3L of  of oxygen per minute at home.    Hypokalemia: Supplemented with potassium.  Borderline magnesium level.  Continue supplementation  Peripheral neuropathy: Continue Neurontin  GERD: Continue PPI therapy  History of essential tremor: On primidone  Hyperlipidemia: Continue statin  Pancytopenia: Current hemoglobin stable in the range of 9-10.  Patient also has mild thrombocytopenia.  The numbers are better than her baseline.  She has history of pancytopenia  thought to be secondary to myelosuppression from niraparib.  Follows with hematology/oncology  History of high-grade serous ovarian carcinoma: Follows with [name].was on niraparib  Hypertension: Continue current medications.  Monitor blood pressure  Weakness: Will request for PT evaluation.Ambulates with the help of walker       DVT prophylaxis:enoxaparin (LOVENOX) injection 40 mg Start: 10/29/22 1000     Code Status: Full Code  Family Communication: None at bedside  Patient status:Inpatient  Patient is from :Home  Anticipated discharge DQ:QIWL  Estimated DC date:1-2 days   Consultants: None  Procedures:None  Antimicrobials:  Anti-infectives (From admission, onward)    Start     Dose/Rate Route Frequency Ordered Stop   10/29/22 2300  cefTRIAXone (ROCEPHIN) 2 g in sodium chloride 0.9 % 100 mL IVPB        2 g 200 mL/hr over 30 Minutes Intravenous Every 24 hours 10/29/22 0048 11/03/22 2259   10/29/22 0100  azithromycin (ZITHROMAX) 500 mg in sodium chloride 0.9 % 250 mL IVPB  Status:  Discontinued        500 mg 250 mL/hr over 60 Minutes Intravenous Every 24 hours 10/29/22 0048 10/29/22 0102   10/28/22 2330  azithromycin (ZITHROMAX) 500 mg in sodium chloride 0.9 % 250 mL IVPB        500 mg 250 mL/hr over 60 Minutes Intravenous Every 24 hours 10/28/22 2323     10/28/22 2315  cefTRIAXone (ROCEPHIN) 1 g in sodium chloride 0.9 % 100 mL IVPB        1 g 200 mL/hr over 30 Minutes Intravenous  Once 10/28/22 2302 10/29/22 0041   10/28/22 2315  azithromycin (ZITHROMAX) 250 mg in dextrose 5 % 125 mL IVPB  Status:  Discontinued  250 mg 127.5 mL/hr over 60 Minutes Intravenous Every 24 hours 10/28/22 2302 10/28/22 2323       Subjective: Patient seen and examined the bedside today.  Hemodynamically stable.  On 3 days of oxygen per minute.  Feels much better today.  Any worsening shortness of breath or cough.  Auscultation revealed bilateral  wheezing with some  crackles on bases.  Eating her breakfast.  Objective: Vitals:   10/29/22 0630 10/29/22 0700 10/29/22 0730 10/29/22 0755  BP: 137/66 (!) 95/57 100/60   Pulse: 76 74 77   Resp: '13 15 14   '$ Temp:    97.9 F (36.6 C)  TempSrc:    Oral  SpO2: 100% 99% 97%   Weight:      Height:        Intake/Output Summary (Last 24 hours) at 10/29/2022 5631 Last data filed at 10/29/2022 0433 Gross per 24 hour  Intake --  Output 900 ml  Net -900 ml   Filed Weights   10/28/22 1616  Weight: 51.7 kg    Examination:  General exam: Overall comfortable, not in distress HEENT: PERRL Respiratory system: Diminished sounds bilaterally, bilateral wheezing, few crackles on bilateral bases Cardiovascular system: S1 & S2 heard, RRR.  Gastrointestinal system: Abdomen is nondistended, soft and nontender. Central nervous system: Alert and oriented Extremities: No edema, no clubbing ,no cyanosis Skin: No rashes, no ulcers,no icterus     Data Reviewed: I have personally reviewed following labs and imaging studies  CBC: Recent Labs  Lab 10/28/22 1700 10/29/22 0432  WBC 9.9 8.4  NEUTROABS 8.1*  --   HGB 10.1* 9.1*  HCT 30.7* 28.5*  MCV 99.0 99.7  PLT 171 497*   Basic Metabolic Panel: Recent Labs  Lab 10/28/22 1700 10/29/22 0432  NA 134* 139  K 4.1 3.1*  CL 92* 101  CO2 31 28  GLUCOSE 114* 125*  BUN 7* 6*  CREATININE 0.51 0.51  CALCIUM 8.3* 8.1*     Recent Results (from the past 240 hour(s))  Culture, blood (Routine X 2) w Reflex to ID Panel     Status: None (Preliminary result)   Collection Time: 10/28/22  5:09 PM   Specimen: Left Antecubital  Result Value Ref Range Status   Specimen Description LEFT ANTECUBITAL  Final   Special Requests   Final    BOTTLES DRAWN AEROBIC AND ANAEROBIC Blood Culture adequate volume Performed at [facility], [address], [place], [place] [address]    Culture PENDING  Incomplete   Report Status PENDING  Incomplete  Culture, blood (routine x 2)      Status: None (Preliminary result)   Collection Time: 10/28/22  5:11 PM   Specimen: Blood  Result Value Ref Range Status   Specimen Description RIGHT ANTECUBITAL  Final   Special Requests   Final    BOTTLES DRAWN AEROBIC AND ANAEROBIC Blood Culture results may not be optimal due to an excessive volume of blood received in culture bottles Performed at [facility], [address], [place], [place] [address]    Culture PENDING  Incomplete   Report Status PENDING  Incomplete  Resp panel by RT-PCR (RSV, Flu A&B, Covid) Anterior Nasal Swab     Status: Abnormal   Collection Time: 10/28/22 10:02 PM   Specimen: Anterior Nasal Swab  Result Value Ref Range Status   SARS Coronavirus 2 by RT PCR NEGATIVE NEGATIVE Final    Comment: (NOTE) SARS-CoV-2 target nucleic acids are NOT DETECTED.  The SARS-CoV-2 RNA is  generally detectable in upper respiratory specimens during the acute phase of infection. The lowest concentration of SARS-CoV-2 viral copies this assay can detect is 138 copies/mL. A negative result does not preclude SARS-Cov-2 infection and should not be used as the sole basis for treatment or other patient management decisions. A negative result may occur with  improper specimen collection/handling, submission of specimen other than nasopharyngeal swab, presence of viral mutation(s) within the areas targeted by this assay, and inadequate number of viral copies(<138 copies/mL). A negative result must be combined with clinical observations, patient history, and epidemiological information. The expected result is Negative.  Fact Sheet for Patients:  EntrepreneurPulse.com.au  Fact Sheet for Healthcare Providers:  IncredibleEmployment.be  This test is no t yet approved or cleared by the [place] FDA and  has been authorized for detection and/or diagnosis of SARS-CoV-2 by FDA under an Emergency Use Authorization (EUA). This EUA will remain  in  effect (meaning this test can be used) for the duration of the COVID-19 declaration under Section 564(b)(1) of the Act, 21 U.S.C.section 360bbb-3(b)(1), unless the authorization is terminated  or revoked sooner.       Influenza A by PCR NEGATIVE NEGATIVE Final   Influenza B by PCR NEGATIVE NEGATIVE Final    Comment: (NOTE) The Xpert Xpress SARS-CoV-2/FLU/RSV plus assay is intended as an aid in the diagnosis of influenza from Nasopharyngeal swab specimens and should not be used as a sole basis for treatment. Nasal washings and aspirates are unacceptable for Xpert Xpress SARS-CoV-2/FLU/RSV testing.  Fact Sheet for Patients: EntrepreneurPulse.com.au  Fact Sheet for Healthcare Providers: IncredibleEmployment.be  This test is not yet approved or cleared by the [place] FDA and has been authorized for detection and/or diagnosis of SARS-CoV-2 by FDA under an Emergency Use Authorization (EUA). This EUA will remain in effect (meaning this test can be used) for the duration of the COVID-19 declaration under Section 564(b)(1) of the Act, 21 U.S.C. section 360bbb-3(b)(1), unless the authorization is terminated or revoked.     Resp Syncytial Virus by PCR POSITIVE (A) NEGATIVE Final    Comment: (NOTE) Fact Sheet for Patients: EntrepreneurPulse.com.au  Fact Sheet for Healthcare Providers: IncredibleEmployment.be  This test is not yet approved or cleared by the [place] FDA and has been authorized for detection and/or diagnosis of SARS-CoV-2 by FDA under an Emergency Use Authorization (EUA). This EUA will remain in effect (meaning this test can be used) for the duration of the COVID-19 declaration under Section 564(b)(1) of the Act, 21 U.S.C. section 360bbb-3(b)(1), unless the authorization is terminated or revoked.  Performed at [facility], [address] [place], [place] [address]      Radiology  Studies: DG Chest 2 View  Result Date: 10/28/2022 CLINICAL DATA:  Shortness of breath EXAM: CHEST - 2 VIEW COMPARISON:  Previous studies including the examination of 10/07/2022 FINDINGS: Cardiac size is within normal limits. Increase in AP diameter of chest suggests COPD. Increased interstitial markings are seen in left lower lung field. There is ectasia of bronchi in the upper lung fields. Emphysematous changes are noted in the upper lung fields. There is no significant pleural effusion or pneumothorax. There is left subclavian central venous catheter with its tip in superior vena cava. IMPRESSION: COPD. Bronchiectasis. Increased interstitial markings in the left lower lung fields may suggest scarring or interstitial pneumonia superimposed over fibrosis. Electronically Signed   By: Elmer Picker M.D.   On: 10/28/2022 16:56    Scheduled Meds:  amLODipine  5 mg Oral  QHS   aspirin EC  81 mg Oral Q breakfast   atorvastatin  40 mg Oral Daily   enoxaparin (LOVENOX) injection  40 mg Subcutaneous Q24H   ferrous sulfate  325 mg Oral QODAY   gabapentin  600 mg Oral TID   ipratropium-albuterol  3 mL Nebulization QID   magnesium oxide  400 mg Oral BID   methylPREDNISolone (SOLU-MEDROL) injection  40 mg Intravenous Q8H   pantoprazole  40 mg Oral BID   primidone  50 mg Oral QHS   Continuous Infusions:  sodium chloride 100 mL/hr at 10/29/22 0200   azithromycin 500 mg (10/29/22 0040)   cefTRIAXone (ROCEPHIN)  IV       LOS: 1 day   Shelly Coss, MD [facility] P12/10/2022, 8:12 AM

## 2022-10-29 NOTE — Assessment & Plan Note (Signed)
-   This is clearly secondary to community-acquired pneumonia and RSV infection. - The patient will be admitted to a medical telemetry bed. - We will continue bronchodilator therapy with DuoNebs 4 times daily and every 4 hours as needed. - Will continue steroid therapy with IV Solu-Medrol. - IV antibiotics will be given as below. - Mucolytic therapy will be provided.

## 2022-10-29 NOTE — Assessment & Plan Note (Signed)
-   We will continue PPI therapy 

## 2022-10-29 NOTE — Assessment & Plan Note (Signed)
-   We will continue her antihypertensives. 

## 2022-10-29 NOTE — Assessment & Plan Note (Signed)
-   We will continue primidone.

## 2022-10-30 ENCOUNTER — Inpatient Hospital Stay (HOSPITAL_COMMUNITY): Payer: Medicare HMO

## 2022-10-30 DIAGNOSIS — J441 Chronic obstructive pulmonary disease with (acute) exacerbation: Secondary | ICD-10-CM | POA: Diagnosis not present

## 2022-10-30 LAB — HEMOGLOBIN AND HEMATOCRIT, BLOOD
HCT: 26.6 % — ABNORMAL LOW (ref 36.0–46.0)
Hemoglobin: 8.4 g/dL — ABNORMAL LOW (ref 12.0–15.0)

## 2022-10-30 LAB — CBC
HCT: 24.5 % — ABNORMAL LOW (ref 36.0–46.0)
Hemoglobin: 7.9 g/dL — ABNORMAL LOW (ref 12.0–15.0)
MCH: 32.2 pg (ref 26.0–34.0)
MCHC: 32.2 g/dL (ref 30.0–36.0)
MCV: 100 fL (ref 80.0–100.0)
Platelets: 140 10*3/uL — ABNORMAL LOW (ref 150–400)
RBC: 2.45 MIL/uL — ABNORMAL LOW (ref 3.87–5.11)
RDW: 16.8 % — ABNORMAL HIGH (ref 11.5–15.5)
WBC: 7.1 10*3/uL (ref 4.0–10.5)
nRBC: 0 % (ref 0.0–0.2)

## 2022-10-30 LAB — BASIC METABOLIC PANEL
Anion gap: 7 (ref 5–15)
BUN: 6 mg/dL — ABNORMAL LOW (ref 8–23)
CO2: 30 mmol/L (ref 22–32)
Calcium: 8 mg/dL — ABNORMAL LOW (ref 8.9–10.3)
Chloride: 102 mmol/L (ref 98–111)
Creatinine, Ser: 0.4 mg/dL — ABNORMAL LOW (ref 0.44–1.00)
GFR, Estimated: >60 mL/min (ref >60)
Glucose, Bld: 135 mg/dL — ABNORMAL HIGH (ref 70–99)
Potassium: 3.9 mmol/L (ref 3.5–5.1)
Sodium: 139 mmol/L (ref 135–145)

## 2022-10-30 LAB — SEDIMENTATION RATE: Sed Rate: 53 mm/hr — ABNORMAL HIGH (ref 0–22)

## 2022-10-30 MED ORDER — BENZONATATE 100 MG PO CAPS
100.0000 mg | ORAL_CAPSULE | Freq: Three times a day (TID) | ORAL | Status: DC
  Administered 2022-10-30 – 2022-11-08 (×28): 100 mg via ORAL
  Filled 2022-10-30 (×28): qty 1

## 2022-10-30 MED ORDER — BUDESONIDE 0.25 MG/2ML IN SUSP
0.2500 mg | Freq: Two times a day (BID) | RESPIRATORY_TRACT | Status: DC
  Administered 2022-10-30 – 2022-11-08 (×18): 0.25 mg via RESPIRATORY_TRACT
  Filled 2022-10-30 (×19): qty 2

## 2022-10-30 MED ORDER — METHYLPREDNISOLONE SODIUM SUCC 125 MG IJ SOLR
80.0000 mg | Freq: Two times a day (BID) | INTRAMUSCULAR | Status: DC
  Administered 2022-10-30 – 2022-11-08 (×18): 80 mg via INTRAVENOUS
  Filled 2022-10-30 (×19): qty 2

## 2022-10-30 MED ORDER — CHLORHEXIDINE GLUCONATE CLOTH 2 % EX PADS
6.0000 | MEDICATED_PAD | Freq: Every day | CUTANEOUS | Status: DC
  Administered 2022-10-31 – 2022-11-08 (×7): 6 via TOPICAL

## 2022-10-30 MED ORDER — LIDOCAINE 5 % EX PTCH
1.0000 | MEDICATED_PATCH | CUTANEOUS | Status: DC
  Administered 2022-10-30 – 2022-11-08 (×10): 1 via TRANSDERMAL
  Filled 2022-10-30 (×10): qty 1

## 2022-10-30 NOTE — Progress Notes (Signed)
PROGRESS NOTE    FOSTER SONNIER  QIO:[id number] DOB: [birth year] DOA: 10/28/2022 PCP: [name], MD   Brief Narrative:    Patient is a 66 year old female with history of anemia, asthma, carotid stenosis, COPD on 3 L 02 at home, hypertension, hyperlipidemia, peripheral neuropathy, peripheral vascular disease who presented to the emergency department with acute onset of dyspnea, greenish sputum, wheezing, fever, chills.  On presentation she was tachycardic.  Found to be RSV positive.  Chest x-ray showed features of COPD, bronchiectasis, increased interstitial markings in the left lower lung field suggesting scarring/interstitial pneumonia.  Patient was admitted for the management of acute COPD exacerbation secondary to RSV and possible pneumonia   Assessment & Plan:   Principal Problem:   COPD exacerbation ([place]) Active Problems:   Sepsis due to pneumonia [facility])   Essential hypertension   Dyslipidemia   Essential tremor   GERD without esophagitis   Peripheral neuropathy   RSV (respiratory syncytial virus pneumonia)  Assessment and Plan:   Acute COPD exacerbation: Presented with wheezing, shortness of breath.  On 3 L of oxygen at home.  Currently on 3 L.  She is a past smoker Continue bronchodilators, steroid therapy, mucolytic's.  Having right chest wall pain will add Lidoderm patch.   RSV infection: No need for antibiotics as procalcitonin is low and therefore will discontinue Rocephin and azithromycin today   Acute on chronic hypoxic respiratory failure:On 3L of  of oxygen per minute at home.     Hypokalemia: Supplemented with potassium.  Borderline magnesium level.  Continue supplementation   Peripheral neuropathy: Continue Neurontin   GERD: Continue PPI therapy   History of essential tremor: On primidone   Hyperlipidemia: Continue statin   Pancytopenia: Current hemoglobin stable in the range of 9-10.  Patient also has mild thrombocytopenia.  The numbers are better than  her baseline.  She has history of pancytopenia thought to be secondary to myelosuppression from niraparib.  Follows with hematology/oncology   History of high-grade serous ovarian carcinoma: Follows with [name].was on niraparib   Hypertension: Continue current medications.  Monitor blood pressure   Weakness: PT evaluation with no home needs noted.     DVT prophylaxis:enoxaparin (LOVENOX) injection 40 mg Start: 10/29/22 1000       Code Status: Full Code   Family Communication: None at bedside   Patient status:Inpatient   Patient is from :Home   Anticipated discharge LK:GMWN   Estimated DC date:1-2 days     Consultants: None   Procedures:None   Antimicrobials:  Anti-infectives (From admission, onward)    Start     Dose/Rate Route Frequency Ordered Stop   10/29/22 2300  cefTRIAXone (ROCEPHIN) 2 g in sodium chloride 0.9 % 100 mL IVPB  Status:  Discontinued        2 g 200 mL/hr over 30 Minutes Intravenous Every 24 hours 10/29/22 0048 10/30/22 0723   10/29/22 0100  azithromycin (ZITHROMAX) 500 mg in sodium chloride 0.9 % 250 mL IVPB  Status:  Discontinued        500 mg 250 mL/hr over 60 Minutes Intravenous Every 24 hours 10/29/22 0048 10/29/22 0102   10/28/22 2330  azithromycin (ZITHROMAX) 500 mg in sodium chloride 0.9 % 250 mL IVPB  Status:  Discontinued        500 mg 250 mL/hr over 60 Minutes Intravenous Every 24 hours 10/28/22 2323 10/30/22 0723   10/28/22 2315  cefTRIAXone (ROCEPHIN) 1 g in sodium chloride 0.9 % 100 mL IVPB  1 g 200 mL/hr over 30 Minutes Intravenous  Once 10/28/22 2302 10/29/22 0041   10/28/22 2315  azithromycin (ZITHROMAX) 250 mg in dextrose 5 % 125 mL IVPB  Status:  Discontinued        250 mg 127.5 mL/hr over 60 Minutes Intravenous Every 24 hours 10/28/22 2302 10/28/22 2323      Subjective: Patient seen and evaluated today with some worsening dyspnea and right-sided chest wall pain noted.  She does not feel ready to go  home.  Objective: Vitals:   10/29/22 1947 10/29/22 2020 10/30/22 0555 10/30/22 0707  BP: 130/71  129/76   Pulse: (!) 109  91   Resp: 20  20   Temp: 98.1 F (36.7 C)  98 F (36.7 C)   TempSrc: Oral  Oral   SpO2: 96% 95% 96% 97%  Weight:      Height:        Intake/Output Summary (Last 24 hours) at 10/30/2022 1342 Last data filed at 10/30/2022 0300 Gross per 24 hour  Intake 2125.72 ml  Output --  Net 2125.72 ml   Filed Weights   10/28/22 1616  Weight: 51.7 kg    Examination:  General exam: Appears calm and comfortable  Respiratory system: Clear to auscultation. Respiratory effort normal.  3 L nasal cannula Cardiovascular system: S1 & S2 heard, RRR.  Gastrointestinal system: Abdomen is soft Central nervous system: Alert and awake Extremities: No edema Skin: No significant lesions noted Psychiatry: Flat affect.    Data Reviewed: I have personally reviewed following labs and imaging studies  CBC: Recent Labs  Lab 10/28/22 1700 10/29/22 0432 10/30/22 0418 10/30/22 0936  WBC 9.9 8.4 7.1  --   NEUTROABS 8.1*  --   --   --   HGB 10.1* 9.1* 7.9* 8.4*  HCT 30.7* 28.5* 24.5* 26.6*  MCV 99.0 99.7 100.0  --   PLT 171 144* 140*  --    Basic Metabolic Panel: Recent Labs  Lab 10/28/22 1700 10/29/22 0432 10/30/22 0418  NA 134* 139 139  K 4.1 3.1* 3.9  CL 92* 101 102  CO2 '31 28 30  '$ GLUCOSE 114* 125* 135*  BUN 7* 6* 6*  CREATININE 0.51 0.51 0.40*  CALCIUM 8.3* 8.1* 8.0*  MG  --  1.7  --    GFR: Estimated Creatinine Clearance: 51 mL/min (A) (by C-G formula based on SCr of 0.4 mg/dL (L)). Liver Function Tests: Recent Labs  Lab 10/28/22 1700  AST 24  ALT 14  ALKPHOS 93  BILITOT 0.4  PROT 6.4*  ALBUMIN 3.4*   No results for input(s): "LIPASE", "AMYLASE" in the last 168 hours. No results for input(s): "AMMONIA" in the last 168 hours. Coagulation Profile: Recent Labs  Lab 10/29/22 0432  INR 0.9   Cardiac Enzymes: No results for input(s): "CKTOTAL",  "CKMB", "CKMBINDEX", "TROPONINI" in the last 168 hours. BNP (last 3 results) No results for input(s): "PROBNP" in the last 8760 hours. HbA1C: No results for input(s): "HGBA1C" in the last 72 hours. CBG: No results for input(s): "GLUCAP" in the last 168 hours. Lipid Profile: No results for input(s): "CHOL", "HDL", "LDLCALC", "TRIG", "CHOLHDL", "LDLDIRECT" in the last 72 hours. Thyroid Function Tests: No results for input(s): "TSH", "T4TOTAL", "FREET4", "T3FREE", "THYROIDAB" in the last 72 hours. Anemia Panel: No results for input(s): "VITAMINB12", "FOLATE", "FERRITIN", "TIBC", "IRON", "RETICCTPCT" in the last 72 hours. Sepsis Labs: Recent Labs  Lab 10/28/22 1700 10/28/22 1913 10/29/22 0432  PROCALCITON  --   --  <0.10  LATICACIDVEN 1.8 1.2  --     Recent Results (from the past 240 hour(s))  Culture, blood (Routine X 2) w Reflex to ID Panel     Status: None (Preliminary result)   Collection Time: 10/28/22  5:09 PM   Specimen: Left Antecubital  Result Value Ref Range Status   Specimen Description LEFT ANTECUBITAL  Final   Special Requests   Final    BOTTLES DRAWN AEROBIC AND ANAEROBIC Blood Culture adequate volume   Culture   Final    NO GROWTH 2 DAYS Performed at [facility], [address] [place], [place] [address]    Report Status PENDING  Incomplete  Culture, blood (routine x 2)     Status: None (Preliminary result)   Collection Time: 10/28/22  5:11 PM   Specimen: Right Antecubital; Blood  Result Value Ref Range Status   Specimen Description RIGHT ANTECUBITAL  Final   Special Requests   Final    BOTTLES DRAWN AEROBIC AND ANAEROBIC Blood Culture results may not be optimal due to an excessive volume of blood received in culture bottles   Culture   Final    NO GROWTH 2 DAYS Performed at [facility], [address] [place], [place] [address]    Report Status PENDING  Incomplete  Resp panel by RT-PCR (RSV, Flu A&B, Covid) Anterior Nasal Swab     Status: Abnormal    Collection Time: 10/28/22 10:02 PM   Specimen: Anterior Nasal Swab  Result Value Ref Range Status   SARS Coronavirus 2 by RT PCR NEGATIVE NEGATIVE Final    Comment: (NOTE) SARS-CoV-2 target nucleic acids are NOT DETECTED.  The SARS-CoV-2 RNA is generally detectable in upper respiratory specimens during the acute phase of infection. The lowest concentration of SARS-CoV-2 viral copies this assay can detect is 138 copies/mL. A negative result does not preclude SARS-Cov-2 infection and should not be used as the sole basis for treatment or other patient management decisions. A negative result may occur with  improper specimen collection/handling, submission of specimen other than nasopharyngeal swab, presence of viral mutation(s) within the areas targeted by this assay, and inadequate number of viral copies(<138 copies/mL). A negative result must be combined with clinical observations, patient history, and epidemiological information. The expected result is Negative.  Fact Sheet for Patients:  EntrepreneurPulse.com.au  Fact Sheet for Healthcare Providers:  IncredibleEmployment.be  This test is no t yet approved or cleared by the [place] FDA and  has been authorized for detection and/or diagnosis of SARS-CoV-2 by FDA under an Emergency Use Authorization (EUA). This EUA will remain  in effect (meaning this test can be used) for the duration of the COVID-19 declaration under Section 564(b)(1) of the Act, 21 U.S.C.section 360bbb-3(b)(1), unless the authorization is terminated  or revoked sooner.       Influenza A by PCR NEGATIVE NEGATIVE Final   Influenza B by PCR NEGATIVE NEGATIVE Final    Comment: (NOTE) The Xpert Xpress SARS-CoV-2/FLU/RSV plus assay is intended as an aid in the diagnosis of influenza from Nasopharyngeal swab specimens and should not be used as a sole basis for treatment. Nasal washings and aspirates are unacceptable for  Xpert Xpress SARS-CoV-2/FLU/RSV testing.  Fact Sheet for Patients: EntrepreneurPulse.com.au  Fact Sheet for Healthcare Providers: IncredibleEmployment.be  This test is not yet approved or cleared by the [place] FDA and has been authorized for detection and/or diagnosis of SARS-CoV-2 by FDA under an Emergency Use Authorization (EUA). This EUA will remain in effect (meaning this  test can be used) for the duration of the COVID-19 declaration under Section 564(b)(1) of the Act, 21 U.S.C. section 360bbb-3(b)(1), unless the authorization is terminated or revoked.     Resp Syncytial Virus by PCR POSITIVE (A) NEGATIVE Final    Comment: (NOTE) Fact Sheet for Patients: EntrepreneurPulse.com.au  Fact Sheet for Healthcare Providers: IncredibleEmployment.be  This test is not yet approved or cleared by the [place] FDA and has been authorized for detection and/or diagnosis of SARS-CoV-2 by FDA under an Emergency Use Authorization (EUA). This EUA will remain in effect (meaning this test can be used) for the duration of the COVID-19 declaration under Section 564(b)(1) of the Act, 21 U.S.C. section 360bbb-3(b)(1), unless the authorization is terminated or revoked.  Performed at [facility], [address] [place], [place] [address]          Radiology Studies: DG Chest 2 View  Result Date: 10/28/2022 CLINICAL DATA:  Shortness of breath EXAM: CHEST - 2 VIEW COMPARISON:  Previous studies including the examination of 10/07/2022 FINDINGS: Cardiac size is within normal limits. Increase in AP diameter of chest suggests COPD. Increased interstitial markings are seen in left lower lung field. There is ectasia of bronchi in the upper lung fields. Emphysematous changes are noted in the upper lung fields. There is no significant pleural effusion or pneumothorax. There is left subclavian central venous catheter with its  tip in superior vena cava. IMPRESSION: COPD. Bronchiectasis. Increased interstitial markings in the left lower lung fields may suggest scarring or interstitial pneumonia superimposed over fibrosis. Electronically Signed   By: Elmer Picker M.D.   On: 10/28/2022 16:56        Scheduled Meds:  amLODipine  5 mg Oral QHS   aspirin EC  81 mg Oral Q breakfast   atorvastatin  40 mg Oral Daily   benzonatate  100 mg Oral TID   enoxaparin (LOVENOX) injection  40 mg Subcutaneous Q24H   ferrous sulfate  325 mg Oral QODAY   gabapentin  600 mg Oral TID   ipratropium-albuterol  3 mL Nebulization TID   lidocaine  1 patch Transdermal Q24H   magnesium oxide  400 mg Oral BID   methylPREDNISolone (SOLU-MEDROL) injection  40 mg Intravenous Q12H   pantoprazole  40 mg Oral BID   primidone  50 mg Oral QHS     LOS: 2 days    Time spent: 35 minutes    Carolos Fecher Darleen Crocker, DO [facility]  If 7PM-7AM, please contact night-coverage [email or web address] 10/30/2022, 1:42 PM

## 2022-10-30 NOTE — TOC Initial Note (Signed)
Transition of Care [facility]) - Initial/Assessment Note    Patient Details  Name: [name] MRN: [id number] Date of Birth: 02/22/1956  [facility]) CM/SW Contact:    Salome Arnt, [place] Phone Number: 10/30/2022, 10:57 AM  Clinical Narrative: Pt admitted with COPD exacerbation. Assessment completed due to high risk readmission score. Pt's daughter, [name] reports she lives with pt. Pt is fairly independent with ADLs, but family takes care of household chores. Pt is on 3L home O2 ([employer]). Daughter reports no current home health services. PT evaluated pt and no follow up needed. Daughter provides transport to appointments. [name] asked about assistance at home with household tasks and LCSW informed her that this would be private pay and home health does not assist with these things. Anticipate d/c tomorrow. Daughter aware. TOC will continue to follow.                   Expected Discharge Plan: Home/Self Care Barriers to Discharge: Continued Medical Work up   Patient Goals and CMS Choice Patient states their goals for this hospitalization and ongoing recovery are:: return home   Choice offered to / list presented to : Patient  Expected Discharge Plan and Services Expected Discharge Plan: Home/Self Care In-house Referral: Clinical Social Work     Living arrangements for the past 2 months: Apartment                                      Prior Living Arrangements/Services Living arrangements for the past 2 months: Apartment Lives with:: Adult Children Patient language and need for interpreter reviewed:: Yes Do you feel safe going back to the place where you live?: Yes      Need for Family Participation in Patient Care: Yes (Comment) Care giver support system in place?: Yes (comment) Current home services: DME (walker, wheelchair, home O2) Criminal Activity/Legal Involvement Pertinent to Current Situation/Hospitalization: No - Comment as  needed  Activities of Daily Living Home Assistive Devices/Equipment: Oxygen ADL Screening (condition at time of admission) Patient's cognitive ability adequate to safely complete daily activities?: Yes Is the patient deaf or have difficulty hearing?: No Does the patient have difficulty seeing, even when wearing glasses/contacts?: No Does the patient have difficulty concentrating, remembering, or making decisions?: No Patient able to express need for assistance with ADLs?: Yes Does the patient have difficulty dressing or bathing?: No Independently performs ADLs?: Yes (appropriate for developmental age) Does the patient have difficulty walking or climbing stairs?: Yes Weakness of Legs: None Weakness of Arms/Hands: None  Permission Sought/Granted                  Emotional Assessment         Alcohol / Substance Use: Not Applicable Psych Involvement: No (comment)  Admission diagnosis:  RSV (respiratory syncytial virus pneumonia) [J12.1] COPD exacerbation ([place]) [J44.1] Community acquired pneumonia of left lower lobe of lung [J18.9] Patient Active Problem List   Diagnosis Date Noted   Sepsis due to pneumonia ([place]) 10/29/2022   Dyslipidemia 10/29/2022   Essential tremor 10/29/2022   GERD without esophagitis 10/29/2022   Peripheral neuropathy 10/29/2022   RSV (respiratory syncytial virus pneumonia) 10/29/2022   Symptomatic anemia 10/03/2022   Macrocytic anemia 10/03/2022   Pancytopenia ([address] [address]) 10/03/2022   Thrush 09/11/2022   Odynophagia 08/26/2022   COPD exacerbation ([place]) 07/31/2022   GERD (gastroesophageal reflux disease) 07/15/2022   ABLA (acute  blood loss anemia)    Esophagitis 06/28/2022   Hematochezia 05/03/2022   E coli bacteremia 03/11/2022   Hypokalemia 03/11/2022   Hyponatremia 03/10/2022   Thrombocytopenia ([place]) 03/10/2022   Acute on chronic respiratory failure with hypoxia and hypercapnia ([place]) 12/30/2021   Deficiency anemia 05/23/2021   Peripheral  neuropathy due to chemotherapy ([place]) 05/23/2021   Sepsis due to undetermined organism ([place]) 12/18/2020   Lobar pneumonia ([place]) 12/18/2020   Syncope 12/17/2020   Genetic testing 08/01/2020   Ovarian cancer ([place]) 07/07/2020   Port-A-Cath in place 07/03/2020   Family history of prostate cancer 07/03/2020   Goals of care, counseling/discussion 07/01/2020   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   Pneumonia 09/13/2018   Acute on chronic respiratory failure ([place]) 01/22/2018   CAP (community acquired pneumonia) 01/18/2016   Iron deficiency anemia 11/07/2015   Chronic respiratory failure with hypoxia ([place]) 11/07/2015   Acute on chronic respiratory failure with hypoxia ([place]) 11/07/2015   Mixed hyperlipidemia 11/07/2015   COPD GOLD 4/ 02 dep 11/06/2015   Essential hypertension 11/06/2015   PCP:  [name], MD Pharmacy:   [place], [place] [facility] [place] [address] [place] [address] Phone: [phone] Fax: [phone]     Social Determinants of Health (SDOH) Interventions Housing Interventions: Intervention Not Indicated  Readmission Risk Interventions    10/30/2022   10:52 AM 10/07/2022   11:16 AM 08/01/2022    2:58 PM  Readmission Risk Prevention Plan  Transportation Screening Complete Complete Complete  Medication Review Press photographer) Complete Complete Complete  HRI or Home Care Consult Complete Complete Complete  SW Recovery Care/Counseling Consult Complete Complete Complete  Palliative Care Screening Not Applicable Not Applicable Not Applicable  Skilled Nursing Facility Not Applicable Not Applicable Not Applicable

## 2022-10-30 NOTE — Progress Notes (Addendum)
Pt states when ambulating to bathroom she felt out of breath. Obtained vitals that showed BP 115/78 P 124 RR 19 O2 93 T 99.3  Pt A&O x 4. States she feels more congested and weaker than normal. Notified MD. Chest Xray ordered.

## 2022-10-30 NOTE — Evaluation (Signed)
Physical Therapy Evaluation Patient Details Name: [name] MRN: [id number] DOB: October 21, 1956 Today's Date: 10/30/2022  History of Present Illness  [name] is a 66 y.o. Caucasian female with medical history significant for anemia, asthma, carotid stenosis, COPD, hypertension, dyslipidemia, peripheral neuropathy, peripheral vascular disease and emphysema, who presented to the emergency room with acute onset of worsening dyspnea over the last couple of days that got remarkably worse today with associated cough productive of greenish sputum as well as wheezing.  She admits to fever that was up to 100.4 with associated chills.  She has been having mild headache without dizziness or blurred vision.  No nausea or vomiting or diarrhea.  No sore throat or earache.  No dysuria, oliguria or hematuria or flank pain.  She quit smoking cigarettes 10 years ago.   Clinical Impression  Patient functioning near baseline for functional mobility and gait demonstrating good return for ambulation in room without use of AD and no loss of balance.  Patient limited mostly due to SOB with SpO2 dropping to 80% while on 3 LPM, able to recover above 91% after sitting in chair - RN notified.  Plan:  Patient discharged from physical therapy to care of nursing for ambulation daily as tolerated for length of stay.         Recommendations for follow up therapy are one component of a multi-disciplinary discharge planning process, led by the attending physician.  Recommendations may be updated based on patient status, additional functional criteria and insurance authorization.  Follow Up Recommendations No PT follow up      Assistance Recommended at Discharge Set up Supervision/Assistance  Patient can return home with the following  A little help with walking and/or transfers;A little help with bathing/dressing/bathroom;Help with stairs or ramp for entrance;Assistance with cooking/housework    Equipment Recommendations  None recommended by PT  Recommendations for Other Services       Functional Status Assessment Patient has had a recent decline in their functional status and/or demonstrates limited ability to make significant improvements in function in a reasonable and predictable amount of time     Precautions / Restrictions Precautions Precautions: None Restrictions Weight Bearing Restrictions: No      Mobility  Bed Mobility Overal bed mobility: Modified Independent                  Transfers Overall transfer level: Modified independent                      Ambulation/Gait Ambulation/Gait assistance: Modified independent (Device/Increase time), Supervision Gait Distance (Feet): 15 Feet Assistive device: None Gait Pattern/deviations: Decreased step length - right, Decreased step length - left, Decreased stride length Gait velocity: decreased     General Gait Details: demonstrates good return for ambuating in room without loss of balance, limited mostly due to SOB with SpO2 dropping to 80% on 3 LPM  Stairs            Wheelchair Mobility    Modified Rankin (Stroke Patients Only)       Balance Overall balance assessment: No apparent balance deficits (not formally assessed)                                           Pertinent Vitals/Pain Pain Assessment Pain Assessment: No/denies pain    Home Living Family/patient expects to be discharged to:: Private  residence Living Arrangements: Spouse/significant other Available Help at Discharge: Family;Available 24 hours/day Type of Home: Apartment Home Access: Level entry       Home Layout: One level Home Equipment: Rollator (4 wheels);Shower seat      Prior Function Prior Level of Function : Needs assist       Physical Assist : Mobility (physical);ADLs (physical) Mobility (physical): Bed mobility;Transfers;Gait;Stairs   Mobility Comments: household ambulator without AD ADLs Comments:  Assisted by family     Hand Dominance        Extremity/Trunk Assessment   Upper Extremity Assessment Upper Extremity Assessment: Overall WFL for tasks assessed    Lower Extremity Assessment Lower Extremity Assessment: Overall WFL for tasks assessed    Cervical / Trunk Assessment Cervical / Trunk Assessment: Normal  Communication   Communication: No difficulties  Cognition Arousal/Alertness: Awake/alert Behavior During Therapy: WFL for tasks assessed/performed Overall Cognitive Status: Within Functional Limits for tasks assessed                                          General Comments      Exercises     Assessment/Plan    PT Assessment Patient does not need any further PT services  PT Problem List         PT Treatment Interventions      PT Goals (Current goals can be found in the Care Plan section)  Acute Rehab PT Goals Patient Stated Goal: return home with family to assist PT Goal Formulation: With patient Time For Goal Achievement: 10/30/22 Potential to Achieve Goals: Good    Frequency       Co-evaluation               AM-PAC PT "6 Clicks" Mobility  Outcome Measure Help needed turning from your back to your side while in a flat bed without using bedrails?: None Help needed moving from lying on your back to sitting on the side of a flat bed without using bedrails?: None Help needed moving to and from a bed to a chair (including a wheelchair)?: None Help needed standing up from a chair using your arms (e.g., wheelchair or bedside chair)?: None Help needed to walk in hospital room?: A Little Help needed climbing 3-5 steps with a railing? : A Little 6 Click Score: 22    End of Session Equipment Utilized During Treatment: Oxygen Activity Tolerance: Patient tolerated treatment well;Patient limited by fatigue Patient left: in chair;with call bell/phone within reach Nurse Communication: Mobility status PT Visit Diagnosis:  Unsteadiness on feet (R26.81);Other abnormalities of gait and mobility (R26.89);Muscle weakness (generalized) (M62.81)    Time: 9211-9417 PT Time Calculation (min) (ACUTE ONLY): 25 min   Charges:   PT Evaluation $PT Eval Moderate Complexity: 1 Mod PT Treatments $Therapeutic Activity: 23-37 mins        1:49 PM, 10/30/22 Lonell Grandchild, MPT Physical Therapist with [facility] [phone] office [phone] mobile phone

## 2022-10-31 ENCOUNTER — Ambulatory Visit: Payer: Medicare HMO | Admitting: Hematology

## 2022-10-31 DIAGNOSIS — J441 Chronic obstructive pulmonary disease with (acute) exacerbation: Secondary | ICD-10-CM | POA: Diagnosis not present

## 2022-10-31 LAB — BASIC METABOLIC PANEL
Anion gap: 8 (ref 5–15)
BUN: 10 mg/dL (ref 8–23)
CO2: 35 mmol/L — ABNORMAL HIGH (ref 22–32)
Calcium: 8.5 mg/dL — ABNORMAL LOW (ref 8.9–10.3)
Chloride: 93 mmol/L — ABNORMAL LOW (ref 98–111)
Creatinine, Ser: 0.48 mg/dL (ref 0.44–1.00)
GFR, Estimated: >60 mL/min (ref >60)
Glucose, Bld: 156 mg/dL — ABNORMAL HIGH (ref 70–99)
Potassium: 4.1 mmol/L (ref 3.5–5.1)
Sodium: 136 mmol/L (ref 135–145)

## 2022-10-31 LAB — CBC
HCT: 24.5 % — ABNORMAL LOW (ref 36.0–46.0)
Hemoglobin: 7.6 g/dL — ABNORMAL LOW (ref 12.0–15.0)
MCH: 31.8 pg (ref 26.0–34.0)
MCHC: 31 g/dL (ref 30.0–36.0)
MCV: 102.5 fL — ABNORMAL HIGH (ref 80.0–100.0)
Platelets: 161 10*3/uL (ref 150–400)
RBC: 2.39 MIL/uL — ABNORMAL LOW (ref 3.87–5.11)
RDW: 17 % — ABNORMAL HIGH (ref 11.5–15.5)
WBC: 9.1 10*3/uL (ref 4.0–10.5)
nRBC: 0 % (ref 0.0–0.2)

## 2022-10-31 LAB — MAGNESIUM: Magnesium: 1.9 mg/dL (ref 1.7–2.4)

## 2022-10-31 LAB — C-REACTIVE PROTEIN
CRP: 2.2 mg/dL — ABNORMAL HIGH (ref <1.0)
CRP: 1.5 mg/dL — ABNORMAL HIGH (ref <1.0)

## 2022-10-31 LAB — SEDIMENTATION RATE: Sed Rate: 40 mm/hr — ABNORMAL HIGH (ref 0–22)

## 2022-10-31 MED ORDER — PHENOL 1.4 % MT LIQD
1.0000 | OROMUCOSAL | Status: DC | PRN
  Filled 2022-10-31: qty 177

## 2022-10-31 MED ORDER — GUAIFENESIN-DM 100-10 MG/5ML PO SYRP
5.0000 mL | ORAL_SOLUTION | ORAL | Status: DC | PRN
  Administered 2022-10-31 – 2022-11-03 (×7): 5 mL via ORAL
  Filled 2022-10-31 (×8): qty 5

## 2022-10-31 NOTE — Progress Notes (Signed)
   10/31/22 1500  Assess: MEWS Score  Temp 98.6 F (37 C)  BP (!) 142/83  MAP (mmHg) 99  Pulse Rate (!) 118  Resp (!) 22  Level of Consciousness Alert  Assess: MEWS Score  MEWS Temp 0  MEWS Systolic 0  MEWS Pulse 2  MEWS RR 1  MEWS LOC 0  MEWS Score 3  MEWS Score Color Yellow  Assess: if the MEWS score is Yellow or Red  Were vital signs taken at a resting state? Yes  Focused Assessment No change from prior assessment  Does the patient meet 2 or more of the SIRS criteria? Yes  Does the patient have a confirmed or suspected source of infection? Yes  Provider and Rapid Response Notified? No  MEWS guidelines implemented *See Row Information* Yes  Treat  MEWS Interventions Administered scheduled meds/treatments  Take Vital Signs  Increase Vital Sign Frequency  Yellow: Q 2hr X 2 then Q 4hr X 2, if remains yellow, continue Q 4hrs  Escalate  MEWS: Escalate Yellow: discuss with charge nurse/RN and consider discussing with provider and RRT  Notify: Charge Nurse/RN  Name of Charge Nurse/RN Notified Mable Fill, RN  Date Charge Nurse/RN Notified 10/31/22  Time Charge Nurse/RN Notified 1500  Provider Notification  Provider Name/Title Dr. Manuella Ghazi  Date Provider Notified 10/31/22  Document  Patient Outcome Stabilized after interventions  Assess: SIRS CRITERIA  SIRS Temperature  0  SIRS Pulse 1  SIRS Respirations  1  SIRS WBC 0  SIRS Score Sum  2

## 2022-10-31 NOTE — Progress Notes (Signed)
PROGRESS NOTE    [name]  RUE:[id number] DOB: Dec 18, 1955 DOA: 10/28/2022 PCP: [name], MD   Brief Narrative:    Patient is a 66 year old female with history of anemia, asthma, carotid stenosis, COPD on 3 L 02 at home, hypertension, hyperlipidemia, peripheral neuropathy, peripheral vascular disease who presented to the emergency department with acute onset of dyspnea, greenish sputum, wheezing, fever, chills.  On presentation she was tachycardic.  Found to be RSV positive.  Chest x-ray showed features of COPD, bronchiectasis, increased interstitial markings in the left lower lung field suggesting scarring/interstitial pneumonia.  Patient was admitted for the management of acute COPD exacerbation secondary to RSV and possible pneumonia.  Assessment & Plan:   Principal Problem:   COPD exacerbation ([place]) Active Problems:   Sepsis due to pneumonia [facility])   Essential hypertension   Dyslipidemia   Essential tremor   GERD without esophagitis   Peripheral neuropathy   RSV (respiratory syncytial virus pneumonia)  Assessment and Plan:   Acute COPD exacerbation: Presented with wheezing, shortness of breath.  On 3 L of oxygen at home.  Currently on 3 L.  She is a past smoker Continue bronchodilators, steroid therapy, mucolytic's.  Having right chest wall pain will added Lidoderm patch. -Continues to have persistent dyspnea requiring higher dose of IV steroids and close monitoring.  Chest x-ray 12/13 with signs of worsening inflammation.   RSV infection: No need for antibiotics as procalcitonin is low and therefore will discontinue Rocephin and azithromycin today   Acute on chronic hypoxic respiratory failure:On 3L of  of oxygen per minute at home.    Peripheral neuropathy: Continue Neurontin   GERD: Continue PPI therapy   History of essential tremor: On primidone   Hyperlipidemia: Continue statin   Pancytopenia: Current hemoglobin stable in the range of 9-10.  Patient also  has mild thrombocytopenia.  The numbers are better than her baseline.  She has history of pancytopenia thought to be secondary to myelosuppression from niraparib.  Follows with hematology/oncology   History of high-grade serous ovarian carcinoma: Follows with [name].was on niraparib   Hypertension: Continue current medications.  Monitor blood pressure   Weakness: PT evaluation with no home needs noted.     DVT prophylaxis:enoxaparin (LOVENOX) injection 40 mg Start: 10/29/22 1000       Code Status: Full Code   Family Communication: None at bedside   Patient status:Inpatient   Patient is from :Home   Anticipated discharge JY:NWGN   Estimated DC date:1-2 days     Consultants: None   Procedures:None   Antimicrobials:  Anti-infectives (From admission, onward)    Start     Dose/Rate Route Frequency Ordered Stop   10/29/22 2300  cefTRIAXone (ROCEPHIN) 2 g in sodium chloride 0.9 % 100 mL IVPB  Status:  Discontinued        2 g 200 mL/hr over 30 Minutes Intravenous Every 24 hours 10/29/22 0048 10/30/22 0723   10/29/22 0100  azithromycin (ZITHROMAX) 500 mg in sodium chloride 0.9 % 250 mL IVPB  Status:  Discontinued        500 mg 250 mL/hr over 60 Minutes Intravenous Every 24 hours 10/29/22 0048 10/29/22 0102   10/28/22 2330  azithromycin (ZITHROMAX) 500 mg in sodium chloride 0.9 % 250 mL IVPB  Status:  Discontinued        500 mg 250 mL/hr over 60 Minutes Intravenous Every 24 hours 10/28/22 2323 10/30/22 0723   10/28/22 2315  cefTRIAXone (ROCEPHIN) 1 g in sodium  chloride 0.9 % 100 mL IVPB        1 g 200 mL/hr over 30 Minutes Intravenous  Once 10/28/22 2302 10/29/22 0041   10/28/22 2315  azithromycin (ZITHROMAX) 250 mg in dextrose 5 % 125 mL IVPB  Status:  Discontinued        250 mg 127.5 mL/hr over 60 Minutes Intravenous Every 24 hours 10/28/22 2302 10/28/22 2323      Subjective: Patient seen and evaluated today with ongoing significant dyspnea and difficulty  completing sentences.  She gets very tired and short of breath with any exertion as well.  Objective: Vitals:   10/30/22 2132 10/31/22 0138 10/31/22 0535 10/31/22 0722  BP: 130/77 (!) 143/81 126/83   Pulse: (!) 122 (!) 110 92   Resp: '19 20 18   '$ Temp: 98.6 F (37 C) 98.3 F (36.8 C) 98 F (36.7 C)   TempSrc: Oral Oral Oral   SpO2: 91% 92% 99% 97%  Weight:      Height:        Intake/Output Summary (Last 24 hours) at 10/31/2022 1145 Last data filed at 10/31/2022 0900 Gross per 24 hour  Intake 600 ml  Output --  Net 600 ml   Filed Weights   10/28/22 1616  Weight: 51.7 kg    Examination:  General exam: Appears calm and comfortable  Respiratory system: Clear to auscultation. Respiratory effort normal.  3 L nasal cannula Cardiovascular system: S1 & S2 heard, RRR.  Gastrointestinal system: Abdomen is soft Central nervous system: Alert and awake Extremities: No edema Skin: No significant lesions noted Psychiatry: Flat affect.    Data Reviewed: I have personally reviewed following labs and imaging studies  CBC: Recent Labs  Lab 10/28/22 1700 10/29/22 0432 10/30/22 0418 10/30/22 0936 10/31/22 0340  WBC 9.9 8.4 7.1  --  9.1  NEUTROABS 8.1*  --   --   --   --   HGB 10.1* 9.1* 7.9* 8.4* 7.6*  HCT 30.7* 28.5* 24.5* 26.6* 24.5*  MCV 99.0 99.7 100.0  --  102.5*  PLT 171 144* 140*  --  244   Basic Metabolic Panel: Recent Labs  Lab 10/28/22 1700 10/29/22 0432 10/30/22 0418 10/31/22 0340  NA 134* 139 139 136  K 4.1 3.1* 3.9 4.1  CL 92* 101 102 93*  CO2 '31 28 30 '$ 35*  GLUCOSE 114* 125* 135* 156*  BUN 7* 6* 6* 10  CREATININE 0.51 0.51 0.40* 0.48  CALCIUM 8.3* 8.1* 8.0* 8.5*  MG  --  1.7  --  1.9   GFR: Estimated Creatinine Clearance: 51 mL/min (by C-G formula based on SCr of 0.48 mg/dL). Liver Function Tests: Recent Labs  Lab 10/28/22 1700  AST 24  ALT 14  ALKPHOS 93  BILITOT 0.4  PROT 6.4*  ALBUMIN 3.4*   No results for input(s): "LIPASE", "AMYLASE"  in the last 168 hours. No results for input(s): "AMMONIA" in the last 168 hours. Coagulation Profile: Recent Labs  Lab 10/29/22 0432  INR 0.9   Cardiac Enzymes: No results for input(s): "CKTOTAL", "CKMB", "CKMBINDEX", "TROPONINI" in the last 168 hours. BNP (last 3 results) No results for input(s): "PROBNP" in the last 8760 hours. HbA1C: No results for input(s): "HGBA1C" in the last 72 hours. CBG: No results for input(s): "GLUCAP" in the last 168 hours. Lipid Profile: No results for input(s): "CHOL", "HDL", "LDLCALC", "TRIG", "CHOLHDL", "LDLDIRECT" in the last 72 hours. Thyroid Function Tests: No results for input(s): "TSH", "T4TOTAL", "FREET4", "T3FREE", "THYROIDAB" in the last  72 hours. Anemia Panel: No results for input(s): "VITAMINB12", "FOLATE", "FERRITIN", "TIBC", "IRON", "RETICCTPCT" in the last 72 hours. Sepsis Labs: Recent Labs  Lab 10/28/22 1700 10/28/22 1913 10/29/22 0432  PROCALCITON  --   --  <0.10  LATICACIDVEN 1.8 1.2  --     Recent Results (from the past 240 hour(s))  Culture, blood (Routine X 2) w Reflex to ID Panel     Status: None (Preliminary result)   Collection Time: 10/28/22  5:09 PM   Specimen: Left Antecubital  Result Value Ref Range Status   Specimen Description LEFT ANTECUBITAL  Final   Special Requests   Final    BOTTLES DRAWN AEROBIC AND ANAEROBIC Blood Culture adequate volume   Culture   Final    NO GROWTH 3 DAYS Performed at [facility], [address], [place], [place] [address]    Report Status PENDING  Incomplete  Culture, blood (routine x 2)     Status: None (Preliminary result)   Collection Time: 10/28/22  5:11 PM   Specimen: Right Antecubital; Blood  Result Value Ref Range Status   Specimen Description RIGHT ANTECUBITAL  Final   Special Requests   Final    BOTTLES DRAWN AEROBIC AND ANAEROBIC Blood Culture results may not be optimal due to an excessive volume of blood received in culture bottles   Culture   Final    NO GROWTH 3  DAYS Performed at [facility], [address], [place], [place] [address]    Report Status PENDING  Incomplete  Resp panel by RT-PCR (RSV, Flu A&B, Covid) Anterior Nasal Swab     Status: Abnormal   Collection Time: 10/28/22 10:02 PM   Specimen: Anterior Nasal Swab  Result Value Ref Range Status   SARS Coronavirus 2 by RT PCR NEGATIVE NEGATIVE Final    Comment: (NOTE) SARS-CoV-2 target nucleic acids are NOT DETECTED.  The SARS-CoV-2 RNA is generally detectable in upper respiratory specimens during the acute phase of infection. The lowest concentration of SARS-CoV-2 viral copies this assay can detect is 138 copies/mL. A negative result does not preclude SARS-Cov-2 infection and should not be used as the sole basis for treatment or other patient management decisions. A negative result may occur with  improper specimen collection/handling, submission of specimen other than nasopharyngeal swab, presence of viral mutation(s) within the areas targeted by this assay, and inadequate number of viral copies(<138 copies/mL). A negative result must be combined with clinical observations, patient history, and epidemiological information. The expected result is Negative.  Fact Sheet for Patients:  EntrepreneurPulse.com.au  Fact Sheet for Healthcare Providers:  IncredibleEmployment.be  This test is no t yet approved or cleared by the [place] FDA and  has been authorized for detection and/or diagnosis of SARS-CoV-2 by FDA under an Emergency Use Authorization (EUA). This EUA will remain  in effect (meaning this test can be used) for the duration of the COVID-19 declaration under Section 564(b)(1) of the Act, 21 U.S.C.section 360bbb-3(b)(1), unless the authorization is terminated  or revoked sooner.       Influenza A by PCR NEGATIVE NEGATIVE Final   Influenza B by PCR NEGATIVE NEGATIVE Final    Comment: (NOTE) The Xpert Xpress SARS-CoV-2/FLU/RSV plus  assay is intended as an aid in the diagnosis of influenza from Nasopharyngeal swab specimens and should not be used as a sole basis for treatment. Nasal washings and aspirates are unacceptable for Xpert Xpress SARS-CoV-2/FLU/RSV testing.  Fact Sheet for Patients: EntrepreneurPulse.com.au  Fact Sheet for Healthcare Providers:  IncredibleEmployment.be  This test is not yet approved or cleared by the [place] and has been authorized for detection and/or diagnosis of SARS-CoV-2 by FDA under an Emergency Use Authorization (EUA). This EUA will remain in effect (meaning this test can be used) for the duration of the COVID-19 declaration under Section 564(b)(1) of the Act, 21 U.S.C. section 360bbb-3(b)(1), unless the authorization is terminated or revoked.     Resp Syncytial Virus by PCR POSITIVE (A) NEGATIVE Final    Comment: (NOTE) Fact Sheet for Patients: EntrepreneurPulse.com.au  Fact Sheet for Healthcare Providers: IncredibleEmployment.be  This test is not yet approved or cleared by the [place] FDA and has been authorized for detection and/or diagnosis of SARS-CoV-2 by FDA under an Emergency Use Authorization (EUA). This EUA will remain in effect (meaning this test can be used) for the duration of the COVID-19 declaration under Section 564(b)(1) of the Act, 21 U.S.C. section 360bbb-3(b)(1), unless the authorization is terminated or revoked.  Performed at [facility], [address], [place], [place] [address]          Radiology Studies: DG CHEST PORT 1 VIEW  Result Date: 10/30/2022 CLINICAL DATA:  Dyspnea EXAM: PORTABLE CHEST 1 VIEW COMPARISON:  Previous studies including the examination of 10/28/2022 FINDINGS: Cardiac size is within normal limits. Flattening of diaphragms suggests COPD. There are no signs of pulmonary edema or focal pulmonary consolidation. There is slight prominence of  interstitial markings in both lungs. Focal increased markings are seen in the medial left lower lung field with no significant change. There is slight improvement in aeration in the lateral aspect of left lower lung field. There is no pleural effusion or pneumothorax. Tip of left subclavian chest port is seen in superior vena cava. IMPRESSION: COPD. There is slight prominence of interstitial markings in both lungs suggesting possible chronic interstitial lung disease with scarring. There is focal increased markings in the medial left lower lung fields suggesting interstitial pneumonia or more pronounced scarring. There is no new focal pulmonary consolidation. There are no signs of alveolar pulmonary edema. Electronically Signed   By: Elmer Picker M.D.   On: 10/30/2022 17:03        Scheduled Meds:  amLODipine  5 mg Oral QHS   aspirin EC  81 mg Oral Q breakfast   atorvastatin  40 mg Oral Daily   benzonatate  100 mg Oral TID   budesonide (PULMICORT) nebulizer solution  0.25 mg Nebulization BID   Chlorhexidine Gluconate Cloth  6 each Topical Q0600   enoxaparin (LOVENOX) injection  40 mg Subcutaneous Q24H   ferrous sulfate  325 mg Oral QODAY   gabapentin  600 mg Oral TID   ipratropium-albuterol  3 mL Nebulization TID   lidocaine  1 patch Transdermal Q24H   magnesium oxide  400 mg Oral BID   methylPREDNISolone (SOLU-MEDROL) injection  80 mg Intravenous Q12H   pantoprazole  40 mg Oral BID   primidone  50 mg Oral QHS     LOS: 3 days    Time spent: 35 minutes    Rawley Harju Darleen Crocker, DO [facility]  If 7PM-7AM, please contact night-coverage [email or web address] 10/31/2022, 11:45 AM

## 2022-11-01 ENCOUNTER — Other Ambulatory Visit: Payer: Self-pay

## 2022-11-01 ENCOUNTER — Inpatient Hospital Stay (HOSPITAL_COMMUNITY): Payer: Medicare HMO

## 2022-11-01 DIAGNOSIS — J441 Chronic obstructive pulmonary disease with (acute) exacerbation: Secondary | ICD-10-CM | POA: Diagnosis not present

## 2022-11-01 LAB — CBC
HCT: 24.1 % — ABNORMAL LOW (ref 36.0–46.0)
Hemoglobin: 7.6 g/dL — ABNORMAL LOW (ref 12.0–15.0)
MCH: 31.8 pg (ref 26.0–34.0)
MCHC: 31.5 g/dL (ref 30.0–36.0)
MCV: 100.8 fL — ABNORMAL HIGH (ref 80.0–100.0)
Platelets: 176 10*3/uL (ref 150–400)
RBC: 2.39 MIL/uL — ABNORMAL LOW (ref 3.87–5.11)
RDW: 16.8 % — ABNORMAL HIGH (ref 11.5–15.5)
WBC: 8.9 10*3/uL (ref 4.0–10.5)
nRBC: 0.2 % (ref 0.0–0.2)

## 2022-11-01 LAB — SEDIMENTATION RATE: Sed Rate: 40 mm/hr — ABNORMAL HIGH (ref 0–22)

## 2022-11-01 LAB — PROCALCITONIN: Procalcitonin: 0.1 ng/mL

## 2022-11-01 LAB — C-REACTIVE PROTEIN: CRP: 0.5 mg/dL (ref <1.0)

## 2022-11-01 NOTE — Progress Notes (Signed)
Port site accessed by this nurse with assistance from Harrie Foreman, RN using sterile technique and the [employer]. Blood return on first stick, dressing placed along with biopatch. Pt tolerated well, resting in bed at this time.

## 2022-11-01 NOTE — Care Management Important Message (Signed)
Important Message  Patient Details  Name: [name] MRN: [id number] Date of Birth: 1956-01-14   Medicare Important Message Given:  Other (see comment) (unable to reach by phone, unable to reach daughter Marcell Barlow at [phone], copy mailed to address on file)     Tommy Medal 11/01/2022, 12:19 PM

## 2022-11-01 NOTE — Progress Notes (Signed)
PROGRESS NOTE    [name]  JTT:[id number] DOB: Jan 06, 1956 DOA: 10/28/2022 PCP: [name], MD   Brief Narrative:    Patient is a 66 year old female with history of anemia, asthma, carotid stenosis, COPD on 3 L 02 at home, hypertension, hyperlipidemia, peripheral neuropathy, peripheral vascular disease who presented to the emergency department with acute onset of dyspnea, greenish sputum, wheezing, fever, chills.  On presentation she was tachycardic.  Found to be RSV positive.  Chest x-ray showed features of COPD, bronchiectasis, increased interstitial markings in the left lower lung field suggesting scarring/interstitial pneumonia.  Patient was admitted for the management of acute COPD exacerbation secondary to RSV and possible pneumonia.   Assessment & Plan:   Principal Problem:   COPD exacerbation ([place]) Active Problems:   Sepsis due to pneumonia [facility])   Essential hypertension   Dyslipidemia   Essential tremor   GERD without esophagitis   Peripheral neuropathy   RSV (respiratory syncytial virus pneumonia)  Assessment and Plan:  Acute COPD exacerbation: Presented with wheezing, shortness of breath.  On 3 L of oxygen at home.  Currently on 3 L.  She is a past smoker Continue bronchodilators, steroid therapy, mucolytic's.  Having right chest wall pain will added Lidoderm patch. -Continues to have persistent dyspnea requiring higher dose of IV steroids and close monitoring.  Chest x-ray 12/15 with no significant changes -Discussed case with pulmonology 12/15 with no need to add any other significant measures at this point, continue high-dose IV steroid   RSV infection: No need for antibiotics as procalcitonin is low and therefore will discontinue Rocephin and azithromycin-procalcitonin is still low   Acute on chronic hypoxic respiratory failure:On 3L of  of oxygen per minute at home.    Peripheral neuropathy: Continue Neurontin   GERD: Continue PPI therapy   History of  essential tremor: On primidone   Hyperlipidemia: Continue statin   Pancytopenia: Current hemoglobin stable in the range of 9-10.  Patient also has mild thrombocytopenia.  The numbers are better than her baseline.  She has history of pancytopenia thought to be secondary to myelosuppression from niraparib.  Follows with hematology/oncology   History of high-grade serous ovarian carcinoma: Follows with [name].was on niraparib   Hypertension: Continue current medications.  Monitor blood pressure   Weakness: PT evaluation with no home needs noted.     DVT prophylaxis:enoxaparin (LOVENOX) injection 40 mg Start: 10/29/22 1000       Code Status: Full Code   Family Communication: None at bedside   Patient status:Inpatient   Patient is from :Home   Anticipated discharge ES:PQZR   Estimated DC date:1-2 days     Consultants: None   Procedures:None   Antimicrobials:  Anti-infectives (From admission, onward)    Start     Dose/Rate Route Frequency Ordered Stop   10/29/22 2300  cefTRIAXone (ROCEPHIN) 2 g in sodium chloride 0.9 % 100 mL IVPB  Status:  Discontinued        2 g 200 mL/hr over 30 Minutes Intravenous Every 24 hours 10/29/22 0048 10/30/22 0723   10/29/22 0100  azithromycin (ZITHROMAX) 500 mg in sodium chloride 0.9 % 250 mL IVPB  Status:  Discontinued        500 mg 250 mL/hr over 60 Minutes Intravenous Every 24 hours 10/29/22 0048 10/29/22 0102   10/28/22 2330  azithromycin (ZITHROMAX) 500 mg in sodium chloride 0.9 % 250 mL IVPB  Status:  Discontinued        500 mg 250 mL/hr  over 60 Minutes Intravenous Every 24 hours 10/28/22 2323 10/30/22 0723   10/28/22 2315  cefTRIAXone (ROCEPHIN) 1 g in sodium chloride 0.9 % 100 mL IVPB        1 g 200 mL/hr over 30 Minutes Intravenous  Once 10/28/22 2302 10/29/22 0041   10/28/22 2315  azithromycin (ZITHROMAX) 250 mg in dextrose 5 % 125 mL IVPB  Status:  Discontinued        250 mg 127.5 mL/hr over 60 Minutes Intravenous Every 24  hours 10/28/22 2302 10/28/22 2323      Subjective: Patient seen and evaluated today with ongoing dyspnea and cough with tachycardia and tachypnea.  Objective: Vitals:   11/01/22 0811 11/01/22 1034 11/01/22 1200 11/01/22 1501  BP:  127/83 135/89   Pulse:  (!) 107 (!) 106   Resp:  20 20   Temp:  97.9 F (36.6 C) 98.4 F (36.9 C)   TempSrc:  Oral Oral   SpO2: 92% 93%  92%  Weight:      Height:        Intake/Output Summary (Last 24 hours) at 11/01/2022 1603 Last data filed at 11/01/2022 1300 Gross per 24 hour  Intake 960 ml  Output --  Net 960 ml   Filed Weights   10/28/22 1616  Weight: 51.7 kg    Examination:  General exam: Appears calm and comfortable  Respiratory system: Diminished bilaterally. Respiratory effort normal.  4 L nasal cannula Cardiovascular system: S1 & S2 heard, RRR.  Gastrointestinal system: Abdomen is soft Central nervous system: Alert and awake Extremities: No edema Skin: No significant lesions noted Psychiatry: Flat affect.    Data Reviewed: I have personally reviewed following labs and imaging studies  CBC: Recent Labs  Lab 10/28/22 1700 10/29/22 0432 10/30/22 0418 10/30/22 0936 10/31/22 0340 11/01/22 0344  WBC 9.9 8.4 7.1  --  9.1 8.9  NEUTROABS 8.1*  --   --   --   --   --   HGB 10.1* 9.1* 7.9* 8.4* 7.6* 7.6*  HCT 30.7* 28.5* 24.5* 26.6* 24.5* 24.1*  MCV 99.0 99.7 100.0  --  102.5* 100.8*  PLT 171 144* 140*  --  161 308   Basic Metabolic Panel: Recent Labs  Lab 10/28/22 1700 10/29/22 0432 10/30/22 0418 10/31/22 0340  NA 134* 139 139 136  K 4.1 3.1* 3.9 4.1  CL 92* 101 102 93*  CO2 '31 28 30 '$ 35*  GLUCOSE 114* 125* 135* 156*  BUN 7* 6* 6* 10  CREATININE 0.51 0.51 0.40* 0.48  CALCIUM 8.3* 8.1* 8.0* 8.5*  MG  --  1.7  --  1.9   GFR: Estimated Creatinine Clearance: 51 mL/min (by C-G formula based on SCr of 0.48 mg/dL). Liver Function Tests: Recent Labs  Lab 10/28/22 1700  AST 24  ALT 14  ALKPHOS 93  BILITOT 0.4   PROT 6.4*  ALBUMIN 3.4*   No results for input(s): "LIPASE", "AMYLASE" in the last 168 hours. No results for input(s): "AMMONIA" in the last 168 hours. Coagulation Profile: Recent Labs  Lab 10/29/22 0432  INR 0.9   Cardiac Enzymes: No results for input(s): "CKTOTAL", "CKMB", "CKMBINDEX", "TROPONINI" in the last 168 hours. BNP (last 3 results) No results for input(s): "PROBNP" in the last 8760 hours. HbA1C: No results for input(s): "HGBA1C" in the last 72 hours. CBG: No results for input(s): "GLUCAP" in the last 168 hours. Lipid Profile: No results for input(s): "CHOL", "HDL", "LDLCALC", "TRIG", "CHOLHDL", "LDLDIRECT" in the last 72 hours. Thyroid  Function Tests: No results for input(s): "TSH", "T4TOTAL", "FREET4", "T3FREE", "THYROIDAB" in the last 72 hours. Anemia Panel: No results for input(s): "VITAMINB12", "FOLATE", "FERRITIN", "TIBC", "IRON", "RETICCTPCT" in the last 72 hours. Sepsis Labs: Recent Labs  Lab 10/28/22 1700 10/28/22 1913 10/29/22 0432 11/01/22 1048  PROCALCITON  --   --  <0.10 0.10  LATICACIDVEN 1.8 1.2  --   --     Recent Results (from the past 240 hour(s))  Culture, blood (Routine X 2) w Reflex to ID Panel     Status: None (Preliminary result)   Collection Time: 10/28/22  5:09 PM   Specimen: Left Antecubital  Result Value Ref Range Status   Specimen Description LEFT ANTECUBITAL  Final   Special Requests   Final    BOTTLES DRAWN AEROBIC AND ANAEROBIC Blood Culture adequate volume   Culture   Final    NO GROWTH 4 DAYS Performed at [facility], [address] [place], [place] [address]    Report Status PENDING  Incomplete  Culture, blood (routine x 2)     Status: None (Preliminary result)   Collection Time: 10/28/22  5:11 PM   Specimen: Right Antecubital; Blood  Result Value Ref Range Status   Specimen Description RIGHT ANTECUBITAL  Final   Special Requests   Final    BOTTLES DRAWN AEROBIC AND ANAEROBIC Blood Culture results may not be optimal  due to an excessive volume of blood received in culture bottles   Culture   Final    NO GROWTH 4 DAYS Performed at [facility], [address] [place], [place] [address]    Report Status PENDING  Incomplete  Resp panel by RT-PCR (RSV, Flu A&B, Covid) Anterior Nasal Swab     Status: Abnormal   Collection Time: 10/28/22 10:02 PM   Specimen: Anterior Nasal Swab  Result Value Ref Range Status   SARS Coronavirus 2 by RT PCR NEGATIVE NEGATIVE Final    Comment: (NOTE) SARS-CoV-2 target nucleic acids are NOT DETECTED.  The SARS-CoV-2 RNA is generally detectable in upper respiratory specimens during the acute phase of infection. The lowest concentration of SARS-CoV-2 viral copies this assay can detect is 138 copies/mL. A negative result does not preclude SARS-Cov-2 infection and should not be used as the sole basis for treatment or other patient management decisions. A negative result may occur with  improper specimen collection/handling, submission of specimen other than nasopharyngeal swab, presence of viral mutation(s) within the areas targeted by this assay, and inadequate number of viral copies(<138 copies/mL). A negative result must be combined with clinical observations, patient history, and epidemiological information. The expected result is Negative.  Fact Sheet for Patients:  EntrepreneurPulse.com.au  Fact Sheet for Healthcare Providers:  IncredibleEmployment.be  This test is no t yet approved or cleared by the [place] FDA and  has been authorized for detection and/or diagnosis of SARS-CoV-2 by FDA under an Emergency Use Authorization (EUA). This EUA will remain  in effect (meaning this test can be used) for the duration of the COVID-19 declaration under Section 564(b)(1) of the Act, 21 U.S.C.section 360bbb-3(b)(1), unless the authorization is terminated  or revoked sooner.       Influenza A by PCR NEGATIVE NEGATIVE Final    Influenza B by PCR NEGATIVE NEGATIVE Final    Comment: (NOTE) The Xpert Xpress SARS-CoV-2/FLU/RSV plus assay is intended as an aid in the diagnosis of influenza from Nasopharyngeal swab specimens and should not be used as a sole basis for treatment. Nasal washings and  aspirates are unacceptable for Xpert Xpress SARS-CoV-2/FLU/RSV testing.  Fact Sheet for Patients: EntrepreneurPulse.com.au  Fact Sheet for Healthcare Providers: IncredibleEmployment.be  This test is not yet approved or cleared by the [place] FDA and has been authorized for detection and/or diagnosis of SARS-CoV-2 by FDA under an Emergency Use Authorization (EUA). This EUA will remain in effect (meaning this test can be used) for the duration of the COVID-19 declaration under Section 564(b)(1) of the Act, 21 U.S.C. section 360bbb-3(b)(1), unless the authorization is terminated or revoked.     Resp Syncytial Virus by PCR POSITIVE (A) NEGATIVE Final    Comment: (NOTE) Fact Sheet for Patients: EntrepreneurPulse.com.au  Fact Sheet for Healthcare Providers: IncredibleEmployment.be  This test is not yet approved or cleared by the [place] FDA and has been authorized for detection and/or diagnosis of SARS-CoV-2 by FDA under an Emergency Use Authorization (EUA). This EUA will remain in effect (meaning this test can be used) for the duration of the COVID-19 declaration under Section 564(b)(1) of the Act, 21 U.S.C. section 360bbb-3(b)(1), unless the authorization is terminated or revoked.  Performed at [facility], [address], [place], [place] [address]          Radiology Studies: DG CHEST PORT 1 VIEW  Result Date: 11/01/2022 CLINICAL DATA:  Dyspnea and respiratory abnormalities EXAM: PORTABLE CHEST 1 VIEW COMPARISON:  Radiograph 10/30/2022 FINDINGS: Chest port catheter tip overlies the superior cavoatrial junction. Unchanged  cardiomediastinal silhouette. Pulmonary hyperinflation. Emphysema and bronchial wall thickening. There is no focal airspace consolidation. No large pleural effusion or evidence of pneumothorax. Bones are unchanged. IMPRESSION: Findings of COPD.  No new airspace disease. Electronically Signed   By: Maurine Simmering M.D.   On: 11/01/2022 12:33   DG CHEST PORT 1 VIEW  Result Date: 10/30/2022 CLINICAL DATA:  Dyspnea EXAM: PORTABLE CHEST 1 VIEW COMPARISON:  Previous studies including the examination of 10/28/2022 FINDINGS: Cardiac size is within normal limits. Flattening of diaphragms suggests COPD. There are no signs of pulmonary edema or focal pulmonary consolidation. There is slight prominence of interstitial markings in both lungs. Focal increased markings are seen in the medial left lower lung field with no significant change. There is slight improvement in aeration in the lateral aspect of left lower lung field. There is no pleural effusion or pneumothorax. Tip of left subclavian chest port is seen in superior vena cava. IMPRESSION: COPD. There is slight prominence of interstitial markings in both lungs suggesting possible chronic interstitial lung disease with scarring. There is focal increased markings in the medial left lower lung fields suggesting interstitial pneumonia or more pronounced scarring. There is no new focal pulmonary consolidation. There are no signs of alveolar pulmonary edema. Electronically Signed   By: Elmer Picker M.D.   On: 10/30/2022 17:03        Scheduled Meds:  amLODipine  5 mg Oral QHS   aspirin EC  81 mg Oral Q breakfast   atorvastatin  40 mg Oral Daily   benzonatate  100 mg Oral TID   budesonide (PULMICORT) nebulizer solution  0.25 mg Nebulization BID   Chlorhexidine Gluconate Cloth  6 each Topical Q0600   enoxaparin (LOVENOX) injection  40 mg Subcutaneous Q24H   ferrous sulfate  325 mg Oral QODAY   gabapentin  600 mg Oral TID   ipratropium-albuterol  3 mL  Nebulization TID   lidocaine  1 patch Transdermal Q24H   magnesium oxide  400 mg Oral BID   methylPREDNISolone (SOLU-MEDROL) injection  80 mg  Intravenous Q12H   pantoprazole  40 mg Oral BID   primidone  50 mg Oral QHS     LOS: 4 days    Time spent: 35 minutes    Meir Elwood Darleen Crocker, DO [facility]  If 7PM-7AM, please contact night-coverage [email or web address] 11/01/2022, 4:03 PM

## 2022-11-02 DIAGNOSIS — J441 Chronic obstructive pulmonary disease with (acute) exacerbation: Secondary | ICD-10-CM | POA: Diagnosis not present

## 2022-11-02 LAB — BASIC METABOLIC PANEL
Anion gap: 11 (ref 5–15)
BUN: 14 mg/dL (ref 8–23)
CO2: 36 mmol/L — ABNORMAL HIGH (ref 22–32)
Calcium: 8.4 mg/dL — ABNORMAL LOW (ref 8.9–10.3)
Chloride: 92 mmol/L — ABNORMAL LOW (ref 98–111)
Creatinine, Ser: 0.42 mg/dL — ABNORMAL LOW (ref 0.44–1.00)
GFR, Estimated: >60 mL/min (ref >60)
Glucose, Bld: 126 mg/dL — ABNORMAL HIGH (ref 70–99)
Potassium: 3.9 mmol/L (ref 3.5–5.1)
Sodium: 139 mmol/L (ref 135–145)

## 2022-11-02 LAB — CBC
HCT: 24.7 % — ABNORMAL LOW (ref 36.0–46.0)
Hemoglobin: 8 g/dL — ABNORMAL LOW (ref 12.0–15.0)
MCH: 32.9 pg (ref 26.0–34.0)
MCHC: 32.4 g/dL (ref 30.0–36.0)
MCV: 101.6 fL — ABNORMAL HIGH (ref 80.0–100.0)
Platelets: 190 10*3/uL (ref 150–400)
RBC: 2.43 MIL/uL — ABNORMAL LOW (ref 3.87–5.11)
RDW: 16.8 % — ABNORMAL HIGH (ref 11.5–15.5)
WBC: 8.2 10*3/uL (ref 4.0–10.5)
nRBC: 0.9 % — ABNORMAL HIGH (ref 0.0–0.2)

## 2022-11-02 LAB — MAGNESIUM: Magnesium: 2.1 mg/dL (ref 1.7–2.4)

## 2022-11-02 LAB — CULTURE, BLOOD (ROUTINE X 2)
Culture: NO GROWTH
Culture: NO GROWTH
Special Requests: ADEQUATE

## 2022-11-02 LAB — SEDIMENTATION RATE: Sed Rate: 39 mm/hr — ABNORMAL HIGH (ref 0–22)

## 2022-11-02 LAB — C-REACTIVE PROTEIN: CRP: <0.5 mg/dL (ref <1.0)

## 2022-11-02 MED ORDER — LEVALBUTEROL HCL 0.63 MG/3ML IN NEBU
0.6300 mg | INHALATION_SOLUTION | Freq: Three times a day (TID) | RESPIRATORY_TRACT | Status: DC
  Administered 2022-11-02 (×3): 0.63 mg via RESPIRATORY_TRACT
  Filled 2022-11-02 (×2): qty 3

## 2022-11-02 MED ORDER — ARFORMOTEROL TARTRATE 15 MCG/2ML IN NEBU
15.0000 ug | INHALATION_SOLUTION | Freq: Two times a day (BID) | RESPIRATORY_TRACT | Status: DC
  Administered 2022-11-02 – 2022-11-08 (×13): 15 ug via RESPIRATORY_TRACT
  Filled 2022-11-02 (×13): qty 2

## 2022-11-02 MED ORDER — IPRATROPIUM-ALBUTEROL 0.5-2.5 (3) MG/3ML IN SOLN: 3.0000 mL | Freq: Four times a day (QID) | RESPIRATORY_TRACT | Status: DC

## 2022-11-02 NOTE — Progress Notes (Signed)
Central tele called and states patient hr ran 180 around 14:12.  Patient Hr is currently 120. MD made aware.

## 2022-11-02 NOTE — Progress Notes (Signed)
PROGRESS NOTE    [name]  HTD:[id number] DOB: 1955/12/12 DOA: 10/28/2022 PCP: [name], MD   Brief Narrative:    Patient is a 66 year old female with history of anemia, asthma, carotid stenosis, COPD on 3 L 02 at home, hypertension, hyperlipidemia, peripheral neuropathy, peripheral vascular disease who presented to the emergency department with acute onset of dyspnea, greenish sputum, wheezing, fever, chills.  On presentation she was tachycardic.  Found to be RSV positive.  Chest x-ray showed features of COPD, bronchiectasis, increased interstitial markings in the left lower lung field suggesting scarring/interstitial pneumonia.  Patient was admitted for the management of acute COPD exacerbation secondary to RSV and possible pneumonia.    Assessment & Plan:   Principal Problem:   COPD exacerbation ([place]) Active Problems:   Sepsis due to pneumonia [facility])   Essential hypertension   Dyslipidemia   Essential tremor   GERD without esophagitis   Peripheral neuropathy   RSV (respiratory syncytial virus pneumonia)  Assessment and Plan:  Acute COPD exacerbation: Presented with wheezing, shortness of breath.  On 3 L of oxygen at home.  Currently on 3 L.  She is a past smoker Continue bronchodilators, steroid therapy, mucolytic's.  Having right chest wall pain will added Lidoderm patch. -Continues to have persistent dyspnea requiring higher dose of IV steroids and close monitoring.  Chest x-ray 12/15 with no significant changes -Discussed case with pulmonology 12/15 with no need to add any other significant measures at this point, continue high-dose IV steroid -Add Brovana 12/16   RSV infection: No need for antibiotics as procalcitonin is low and therefore will discontinue Rocephin and azithromycin-procalcitonin is still low   Acute on chronic hypoxic respiratory failure:On 3L of  of oxygen per minute at home.    Peripheral neuropathy: Continue Neurontin   GERD: Continue PPI  therapy   History of essential tremor: On primidone   Hyperlipidemia: Continue statin   Pancytopenia: Current hemoglobin stable in the range of 9-10.  Patient also has mild thrombocytopenia.  The numbers are better than her baseline.  She has history of pancytopenia thought to be secondary to myelosuppression from niraparib.  Follows with hematology/oncology   History of high-grade serous ovarian carcinoma: Follows with [name].was on niraparib   Hypertension: Continue current medications.  Monitor blood pressure   Weakness: PT evaluation with no home needs noted.     DVT prophylaxis:enoxaparin (LOVENOX) injection 40 mg Start: 10/29/22 1000       Code Status: Full Code   Family Communication: None at bedside   Patient status:Inpatient   Patient is from :Home   Anticipated discharge BW:IOMB   Estimated DC date:1-2 days     Consultants: None   Procedures:None  Antimicrobials:  Anti-infectives (From admission, onward)    Start     Dose/Rate Route Frequency Ordered Stop   10/29/22 2300  cefTRIAXone (ROCEPHIN) 2 g in sodium chloride 0.9 % 100 mL IVPB  Status:  Discontinued        2 g 200 mL/hr over 30 Minutes Intravenous Every 24 hours 10/29/22 0048 10/30/22 0723   10/29/22 0100  azithromycin (ZITHROMAX) 500 mg in sodium chloride 0.9 % 250 mL IVPB  Status:  Discontinued        500 mg 250 mL/hr over 60 Minutes Intravenous Every 24 hours 10/29/22 0048 10/29/22 0102   10/28/22 2330  azithromycin (ZITHROMAX) 500 mg in sodium chloride 0.9 % 250 mL IVPB  Status:  Discontinued        500  mg 250 mL/hr over 60 Minutes Intravenous Every 24 hours 10/28/22 2323 10/30/22 0723   10/28/22 2315  cefTRIAXone (ROCEPHIN) 1 g in sodium chloride 0.9 % 100 mL IVPB        1 g 200 mL/hr over 30 Minutes Intravenous  Once 10/28/22 2302 10/29/22 0041   10/28/22 2315  azithromycin (ZITHROMAX) 250 mg in dextrose 5 % 125 mL IVPB  Status:  Discontinued        250 mg 127.5 mL/hr over 60  Minutes Intravenous Every 24 hours 10/28/22 2302 10/28/22 2323       Subjective: Patient seen and evaluated today with ongoing shortness of breath requiring 5 L nasal cannula.  Objective: Vitals:   11/01/22 2210 11/01/22 2359 11/02/22 0433 11/02/22 0822  BP:  (!) 102/59 (!) 155/95   Pulse:  99 97   Resp:  16 16   Temp:  98.7 F (37.1 C) 97.8 F (36.6 C)   TempSrc:   Oral   SpO2: 96% 100% 99% 99%  Weight:      Height:        Intake/Output Summary (Last 24 hours) at 11/02/2022 0924 Last data filed at 11/01/2022 1700 Gross per 24 hour  Intake 720 ml  Output --  Net 720 ml   Filed Weights   10/28/22 1616  Weight: 51.7 kg    Examination:  General exam: Appears calm and comfortable  Respiratory system: Clear to auscultation. Respiratory effort normal.  Currently on 5 L nasal cannula. Cardiovascular system: S1 & S2 heard, RRR.  Gastrointestinal system: Abdomen is soft Central nervous system: Alert and awake Extremities: No edema Skin: No significant lesions noted Psychiatry: Flat affect.    Data Reviewed: I have personally reviewed following labs and imaging studies  CBC: Recent Labs  Lab 10/28/22 1700 10/29/22 0432 10/30/22 0418 10/30/22 0936 10/31/22 0340 11/01/22 0344 11/02/22 0726  WBC 9.9 8.4 7.1  --  9.1 8.9 8.2  NEUTROABS 8.1*  --   --   --   --   --   --   HGB 10.1* 9.1* 7.9* 8.4* 7.6* 7.6* 8.0*  HCT 30.7* 28.5* 24.5* 26.6* 24.5* 24.1* 24.7*  MCV 99.0 99.7 100.0  --  102.5* 100.8* 101.6*  PLT 171 144* 140*  --  161 176 240   Basic Metabolic Panel: Recent Labs  Lab 10/28/22 1700 10/29/22 0432 10/30/22 0418 10/31/22 0340 11/02/22 0726  NA 134* 139 139 136 139  K 4.1 3.1* 3.9 4.1 3.9  CL 92* 101 102 93* 92*  CO2 '31 28 30 '$ 35* 36*  GLUCOSE 114* 125* 135* 156* 126*  BUN 7* 6* 6* 10 14  CREATININE 0.51 0.51 0.40* 0.48 0.42*  CALCIUM 8.3* 8.1* 8.0* 8.5* 8.4*  MG  --  1.7  --  1.9 2.1   GFR: Estimated Creatinine Clearance: 51 mL/min (A)  (by C-G formula based on SCr of 0.42 mg/dL (L)). Liver Function Tests: Recent Labs  Lab 10/28/22 1700  AST 24  ALT 14  ALKPHOS 93  BILITOT 0.4  PROT 6.4*  ALBUMIN 3.4*   No results for input(s): "LIPASE", "AMYLASE" in the last 168 hours. No results for input(s): "AMMONIA" in the last 168 hours. Coagulation Profile: Recent Labs  Lab 10/29/22 0432  INR 0.9   Cardiac Enzymes: No results for input(s): "CKTOTAL", "CKMB", "CKMBINDEX", "TROPONINI" in the last 168 hours. BNP (last 3 results) No results for input(s): "PROBNP" in the last 8760 hours. HbA1C: No results for input(s): "HGBA1C" in the last  72 hours. CBG: No results for input(s): "GLUCAP" in the last 168 hours. Lipid Profile: No results for input(s): "CHOL", "HDL", "LDLCALC", "TRIG", "CHOLHDL", "LDLDIRECT" in the last 72 hours. Thyroid Function Tests: No results for input(s): "TSH", "T4TOTAL", "FREET4", "T3FREE", "THYROIDAB" in the last 72 hours. Anemia Panel: No results for input(s): "VITAMINB12", "FOLATE", "FERRITIN", "TIBC", "IRON", "RETICCTPCT" in the last 72 hours. Sepsis Labs: Recent Labs  Lab 10/28/22 1700 10/28/22 1913 10/29/22 0432 11/01/22 1048  PROCALCITON  --   --  <0.10 0.10  LATICACIDVEN 1.8 1.2  --   --     Recent Results (from the past 240 hour(s))  Culture, blood (Routine X 2) w Reflex to ID Panel     Status: None   Collection Time: 10/28/22  5:09 PM   Specimen: Left Antecubital  Result Value Ref Range Status   Specimen Description LEFT ANTECUBITAL  Final   Special Requests   Final    BOTTLES DRAWN AEROBIC AND ANAEROBIC Blood Culture adequate volume   Culture   Final    NO GROWTH 5 DAYS Performed at [facility], [address], [place], [place] [address]    Report Status 11/02/2022 FINAL  Final  Culture, blood (routine x 2)     Status: None   Collection Time: 10/28/22  5:11 PM   Specimen: Right Antecubital; Blood  Result Value Ref Range Status   Specimen Description RIGHT ANTECUBITAL   Final   Special Requests   Final    BOTTLES DRAWN AEROBIC AND ANAEROBIC Blood Culture results may not be optimal due to an excessive volume of blood received in culture bottles   Culture   Final    NO GROWTH 5 DAYS Performed at [facility], [address] [place], [place] [address]    Report Status 11/02/2022 FINAL  Final  Resp panel by RT-PCR (RSV, Flu A&B, Covid) Anterior Nasal Swab     Status: Abnormal   Collection Time: 10/28/22 10:02 PM   Specimen: Anterior Nasal Swab  Result Value Ref Range Status   SARS Coronavirus 2 by RT PCR NEGATIVE NEGATIVE Final    Comment: (NOTE) SARS-CoV-2 target nucleic acids are NOT DETECTED.  The SARS-CoV-2 RNA is generally detectable in upper respiratory specimens during the acute phase of infection. The lowest concentration of SARS-CoV-2 viral copies this assay can detect is 138 copies/mL. A negative result does not preclude SARS-Cov-2 infection and should not be used as the sole basis for treatment or other patient management decisions. A negative result may occur with  improper specimen collection/handling, submission of specimen other than nasopharyngeal swab, presence of viral mutation(s) within the areas targeted by this assay, and inadequate number of viral copies(<138 copies/mL). A negative result must be combined with clinical observations, patient history, and epidemiological information. The expected result is Negative.  Fact Sheet for Patients:  EntrepreneurPulse.com.au  Fact Sheet for Healthcare Providers:  IncredibleEmployment.be  This test is no t yet approved or cleared by the [place] FDA and  has been authorized for detection and/or diagnosis of SARS-CoV-2 by FDA under an Emergency Use Authorization (EUA). This EUA will remain  in effect (meaning this test can be used) for the duration of the COVID-19 declaration under Section 564(b)(1) of the Act, 21 U.S.C.section 360bbb-3(b)(1),  unless the authorization is terminated  or revoked sooner.       Influenza A by PCR NEGATIVE NEGATIVE Final   Influenza B by PCR NEGATIVE NEGATIVE Final    Comment: (NOTE) The Xpert Xpress SARS-CoV-2/FLU/RSV plus  assay is intended as an aid in the diagnosis of influenza from Nasopharyngeal swab specimens and should not be used as a sole basis for treatment. Nasal washings and aspirates are unacceptable for Xpert Xpress SARS-CoV-2/FLU/RSV testing.  Fact Sheet for Patients: EntrepreneurPulse.com.au  Fact Sheet for Healthcare Providers: IncredibleEmployment.be  This test is not yet approved or cleared by the [place] FDA and has been authorized for detection and/or diagnosis of SARS-CoV-2 by FDA under an Emergency Use Authorization (EUA). This EUA will remain in effect (meaning this test can be used) for the duration of the COVID-19 declaration under Section 564(b)(1) of the Act, 21 U.S.C. section 360bbb-3(b)(1), unless the authorization is terminated or revoked.     Resp Syncytial Virus by PCR POSITIVE (A) NEGATIVE Final    Comment: (NOTE) Fact Sheet for Patients: EntrepreneurPulse.com.au  Fact Sheet for Healthcare Providers: IncredibleEmployment.be  This test is not yet approved or cleared by the [place] FDA and has been authorized for detection and/or diagnosis of SARS-CoV-2 by FDA under an Emergency Use Authorization (EUA). This EUA will remain in effect (meaning this test can be used) for the duration of the COVID-19 declaration under Section 564(b)(1) of the Act, 21 U.S.C. section 360bbb-3(b)(1), unless the authorization is terminated or revoked.  Performed at [facility], [address] [place], [place] [address]          Radiology Studies: DG CHEST PORT 1 VIEW  Result Date: 11/01/2022 CLINICAL DATA:  Dyspnea and respiratory abnormalities EXAM: PORTABLE CHEST 1 VIEW  COMPARISON:  Radiograph 10/30/2022 FINDINGS: Chest port catheter tip overlies the superior cavoatrial junction. Unchanged cardiomediastinal silhouette. Pulmonary hyperinflation. Emphysema and bronchial wall thickening. There is no focal airspace consolidation. No large pleural effusion or evidence of pneumothorax. Bones are unchanged. IMPRESSION: Findings of COPD.  No new airspace disease. Electronically Signed   By: Maurine Simmering M.D.   On: 11/01/2022 12:33        Scheduled Meds:  amLODipine  5 mg Oral QHS   arformoterol  15 mcg Nebulization BID   aspirin EC  81 mg Oral Q breakfast   atorvastatin  40 mg Oral Daily   benzonatate  100 mg Oral TID   budesonide (PULMICORT) nebulizer solution  0.25 mg Nebulization BID   Chlorhexidine Gluconate Cloth  6 each Topical Q0600   enoxaparin (LOVENOX) injection  40 mg Subcutaneous Q24H   ferrous sulfate  325 mg Oral QODAY   gabapentin  600 mg Oral TID   ipratropium-albuterol  3 mL Nebulization Q6H   lidocaine  1 patch Transdermal Q24H   magnesium oxide  400 mg Oral BID   methylPREDNISolone (SOLU-MEDROL) injection  80 mg Intravenous Q12H   pantoprazole  40 mg Oral BID   primidone  50 mg Oral QHS     LOS: 5 days    Time spent: 35 minutes    [name] Darleen Crocker, DO [facility]  If 7PM-7AM, please contact night-coverage [email or web address] 11/02/2022, 9:24 AM

## 2022-11-03 DIAGNOSIS — J441 Chronic obstructive pulmonary disease with (acute) exacerbation: Secondary | ICD-10-CM | POA: Diagnosis not present

## 2022-11-03 MED ORDER — LEVALBUTEROL HCL 0.63 MG/3ML IN NEBU
0.6300 mg | INHALATION_SOLUTION | Freq: Three times a day (TID) | RESPIRATORY_TRACT | Status: DC
  Administered 2022-11-03 – 2022-11-07 (×15): 0.63 mg via RESPIRATORY_TRACT
  Filled 2022-11-03 (×16): qty 3

## 2022-11-03 NOTE — Progress Notes (Signed)
PROGRESS NOTE    [name]  TUU:[id number] DOB: [birth year] DOA: 10/28/2022 PCP: [name], MD   Brief Narrative:    Patient is a 66 year old female with history of anemia, asthma, carotid stenosis, COPD on 3 L 02 at home, hypertension, hyperlipidemia, peripheral neuropathy, peripheral vascular disease who presented to the emergency department with acute onset of dyspnea, greenish sputum, wheezing, fever, chills.  On presentation she was tachycardic.  Found to be RSV positive.  Chest x-ray showed features of COPD, bronchiectasis, increased interstitial markings in the left lower lung field suggesting scarring/interstitial pneumonia.  Patient was admitted for the management of acute COPD exacerbation secondary to RSV and possible pneumonia.     Assessment & Plan:   Principal Problem:   COPD exacerbation ([place]) Active Problems:   Sepsis due to pneumonia [facility])   Essential hypertension   Dyslipidemia   Essential tremor   GERD without esophagitis   Peripheral neuropathy   RSV (respiratory syncytial virus pneumonia)  Assessment and Plan:   Acute COPD exacerbation: Presented with wheezing, shortness of breath.  On 3 L of oxygen at home.  Currently on 3 L.  She is a past smoker Continue bronchodilators, steroid therapy, mucolytic's.  Having right chest wall pain will added Lidoderm patch. -Continues to have persistent dyspnea requiring higher dose of IV steroids and close monitoring.  Chest x-ray 12/15 with no significant changes -Discussed case with pulmonology 12/15 with no need to add any other significant measures at this point, continue high-dose IV steroid -Added Brovana 12/16 -Appears to be slowly improving   RSV infection: No need for antibiotics as procalcitonin is low and therefore will discontinue Rocephin and azithromycin-procalcitonin is still low   Acute on chronic hypoxic respiratory failure:On 3L of  of oxygen per minute at home.    Peripheral neuropathy: Continue  Neurontin   GERD: Continue PPI therapy   History of essential tremor: On primidone   Hyperlipidemia: Continue statin   Pancytopenia: Current hemoglobin stable in the range of 9-10.  Patient also has mild thrombocytopenia.  The numbers are better than her baseline.  She has history of pancytopenia thought to be secondary to myelosuppression from niraparib.  Follows with hematology/oncology   History of high-grade serous ovarian carcinoma: Follows with [name].was on niraparib   Hypertension: Continue current medications.  Monitor blood pressure   Weakness: PT evaluation with no home needs noted.     DVT prophylaxis:enoxaparin (LOVENOX) injection 40 mg Start: 10/29/22 1000       Code Status: Full Code   Family Communication: None at bedside   Patient status:Inpatient   Patient is from :Home   Anticipated discharge PH:XTAV   Estimated DC date:1-2 days     Consultants: None   Procedures:None   Antimicrobials:  Anti-infectives (From admission, onward)    Start     Dose/Rate Route Frequency Ordered Stop   10/29/22 2300  cefTRIAXone (ROCEPHIN) 2 g in sodium chloride 0.9 % 100 mL IVPB  Status:  Discontinued        2 g 200 mL/hr over 30 Minutes Intravenous Every 24 hours 10/29/22 0048 10/30/22 0723   10/29/22 0100  azithromycin (ZITHROMAX) 500 mg in sodium chloride 0.9 % 250 mL IVPB  Status:  Discontinued        500 mg 250 mL/hr over 60 Minutes Intravenous Every 24 hours 10/29/22 0048 10/29/22 0102   10/28/22 2330  azithromycin (ZITHROMAX) 500 mg in sodium chloride 0.9 % 250 mL IVPB  Status:  Discontinued  500 mg 250 mL/hr over 60 Minutes Intravenous Every 24 hours 10/28/22 2323 10/30/22 0723   10/28/22 2315  cefTRIAXone (ROCEPHIN) 1 g in sodium chloride 0.9 % 100 mL IVPB        1 g 200 mL/hr over 30 Minutes Intravenous  Once 10/28/22 2302 10/29/22 0041   10/28/22 2315  azithromycin (ZITHROMAX) 250 mg in dextrose 5 % 125 mL IVPB  Status:  Discontinued         250 mg 127.5 mL/hr over 60 Minutes Intravenous Every 24 hours 10/28/22 2302 10/28/22 2323       Subjective: Patient seen and evaluated today with some improvement in her dyspnea noted.  She still remains on 5 L nasal cannula.  Objective: Vitals:   11/03/22 0424 11/03/22 0723 11/03/22 0727 11/03/22 0751  BP: 116/71     Pulse: 80     Resp: 20     Temp: 98.1 F (36.7 C)     TempSrc: Oral     SpO2: 100% 100% 100% 100%  Weight:      Height:        Intake/Output Summary (Last 24 hours) at 11/03/2022 0955 Last data filed at 11/02/2022 1700 Gross per 24 hour  Intake 240 ml  Output --  Net 240 ml   Filed Weights   10/28/22 1616  Weight: 51.7 kg    Examination:  General exam: Appears calm and comfortable  Respiratory system: Wheezing noted at bilateral lung bases. Respiratory effort normal.  5 L nasal cannula Cardiovascular system: S1 & S2 heard, RRR.  Gastrointestinal system: Abdomen is soft Central nervous system: Alert and awake Extremities: No edema Skin: No significant lesions noted Psychiatry: Flat affect.    Data Reviewed: I have personally reviewed following labs and imaging studies  CBC: Recent Labs  Lab 10/28/22 1700 10/29/22 0432 10/30/22 0418 10/30/22 0936 10/31/22 0340 11/01/22 0344 11/02/22 0726  WBC 9.9 8.4 7.1  --  9.1 8.9 8.2  NEUTROABS 8.1*  --   --   --   --   --   --   HGB 10.1* 9.1* 7.9* 8.4* 7.6* 7.6* 8.0*  HCT 30.7* 28.5* 24.5* 26.6* 24.5* 24.1* 24.7*  MCV 99.0 99.7 100.0  --  102.5* 100.8* 101.6*  PLT 171 144* 140*  --  161 176 496   Basic Metabolic Panel: Recent Labs  Lab 10/28/22 1700 10/29/22 0432 10/30/22 0418 10/31/22 0340 11/02/22 0726  NA 134* 139 139 136 139  K 4.1 3.1* 3.9 4.1 3.9  CL 92* 101 102 93* 92*  CO2 '31 28 30 '$ 35* 36*  GLUCOSE 114* 125* 135* 156* 126*  BUN 7* 6* 6* 10 14  CREATININE 0.51 0.51 0.40* 0.48 0.42*  CALCIUM 8.3* 8.1* 8.0* 8.5* 8.4*  MG  --  1.7  --  1.9 2.1   GFR: Estimated Creatinine  Clearance: 51 mL/min (A) (by C-G formula based on SCr of 0.42 mg/dL (L)). Liver Function Tests: Recent Labs  Lab 10/28/22 1700  AST 24  ALT 14  ALKPHOS 93  BILITOT 0.4  PROT 6.4*  ALBUMIN 3.4*   No results for input(s): "LIPASE", "AMYLASE" in the last 168 hours. No results for input(s): "AMMONIA" in the last 168 hours. Coagulation Profile: Recent Labs  Lab 10/29/22 0432  INR 0.9   Cardiac Enzymes: No results for input(s): "CKTOTAL", "CKMB", "CKMBINDEX", "TROPONINI" in the last 168 hours. BNP (last 3 results) No results for input(s): "PROBNP" in the last 8760 hours. HbA1C: No results for input(s): "HGBA1C"  in the last 72 hours. CBG: No results for input(s): "GLUCAP" in the last 168 hours. Lipid Profile: No results for input(s): "CHOL", "HDL", "LDLCALC", "TRIG", "CHOLHDL", "LDLDIRECT" in the last 72 hours. Thyroid Function Tests: No results for input(s): "TSH", "T4TOTAL", "FREET4", "T3FREE", "THYROIDAB" in the last 72 hours. Anemia Panel: No results for input(s): "VITAMINB12", "FOLATE", "FERRITIN", "TIBC", "IRON", "RETICCTPCT" in the last 72 hours. Sepsis Labs: Recent Labs  Lab 10/28/22 1700 10/28/22 1913 10/29/22 0432 11/01/22 1048  PROCALCITON  --   --  <0.10 0.10  LATICACIDVEN 1.8 1.2  --   --     Recent Results (from the past 240 hour(s))  Culture, blood (Routine X 2) w Reflex to ID Panel     Status: None   Collection Time: 10/28/22  5:09 PM   Specimen: Left Antecubital  Result Value Ref Range Status   Specimen Description LEFT ANTECUBITAL  Final   Special Requests   Final    BOTTLES DRAWN AEROBIC AND ANAEROBIC Blood Culture adequate volume   Culture   Final    NO GROWTH 5 DAYS Performed at [facility], [address], [place], [place] [address]    Report Status 11/02/2022 FINAL  Final  Culture, blood (routine x 2)     Status: None   Collection Time: 10/28/22  5:11 PM   Specimen: Right Antecubital; Blood  Result Value Ref Range Status   Specimen  Description RIGHT ANTECUBITAL  Final   Special Requests   Final    BOTTLES DRAWN AEROBIC AND ANAEROBIC Blood Culture results may not be optimal due to an excessive volume of blood received in culture bottles   Culture   Final    NO GROWTH 5 DAYS Performed at [facility], [address] [place], [place] [address]    Report Status 11/02/2022 FINAL  Final  Resp panel by RT-PCR (RSV, Flu A&B, Covid) Anterior Nasal Swab     Status: Abnormal   Collection Time: 10/28/22 10:02 PM   Specimen: Anterior Nasal Swab  Result Value Ref Range Status   SARS Coronavirus 2 by RT PCR NEGATIVE NEGATIVE Final    Comment: (NOTE) SARS-CoV-2 target nucleic acids are NOT DETECTED.  The SARS-CoV-2 RNA is generally detectable in upper respiratory specimens during the acute phase of infection. The lowest concentration of SARS-CoV-2 viral copies this assay can detect is 138 copies/mL. A negative result does not preclude SARS-Cov-2 infection and should not be used as the sole basis for treatment or other patient management decisions. A negative result may occur with  improper specimen collection/handling, submission of specimen other than nasopharyngeal swab, presence of viral mutation(s) within the areas targeted by this assay, and inadequate number of viral copies(<138 copies/mL). A negative result must be combined with clinical observations, patient history, and epidemiological information. The expected result is Negative.  Fact Sheet for Patients:  EntrepreneurPulse.com.au  Fact Sheet for Healthcare Providers:  IncredibleEmployment.be  This test is no t yet approved or cleared by the [place] FDA and  has been authorized for detection and/or diagnosis of SARS-CoV-2 by FDA under an Emergency Use Authorization (EUA). This EUA will remain  in effect (meaning this test can be used) for the duration of the COVID-19 declaration under Section 564(b)(1) of the Act,  21 U.S.C.section 360bbb-3(b)(1), unless the authorization is terminated  or revoked sooner.       Influenza A by PCR NEGATIVE NEGATIVE Final   Influenza B by PCR NEGATIVE NEGATIVE Final    Comment: (NOTE) The Xpert  Xpress SARS-CoV-2/FLU/RSV plus assay is intended as an aid in the diagnosis of influenza from Nasopharyngeal swab specimens and should not be used as a sole basis for treatment. Nasal washings and aspirates are unacceptable for Xpert Xpress SARS-CoV-2/FLU/RSV testing.  Fact Sheet for Patients: EntrepreneurPulse.com.au  Fact Sheet for Healthcare Providers: IncredibleEmployment.be  This test is not yet approved or cleared by the [place] FDA and has been authorized for detection and/or diagnosis of SARS-CoV-2 by FDA under an Emergency Use Authorization (EUA). This EUA will remain in effect (meaning this test can be used) for the duration of the COVID-19 declaration under Section 564(b)(1) of the Act, 21 U.S.C. section 360bbb-3(b)(1), unless the authorization is terminated or revoked.     Resp Syncytial Virus by PCR POSITIVE (A) NEGATIVE Final    Comment: (NOTE) Fact Sheet for Patients: EntrepreneurPulse.com.au  Fact Sheet for Healthcare Providers: IncredibleEmployment.be  This test is not yet approved or cleared by the [place] FDA and has been authorized for detection and/or diagnosis of SARS-CoV-2 by FDA under an Emergency Use Authorization (EUA). This EUA will remain in effect (meaning this test can be used) for the duration of the COVID-19 declaration under Section 564(b)(1) of the Act, 21 U.S.C. section 360bbb-3(b)(1), unless the authorization is terminated or revoked.  Performed at [facility], [address] [place], [place] [address]          Radiology Studies: DG CHEST PORT 1 VIEW  Result Date: 11/01/2022 CLINICAL DATA:  Dyspnea and respiratory abnormalities  EXAM: PORTABLE CHEST 1 VIEW COMPARISON:  Radiograph 10/30/2022 FINDINGS: Chest port catheter tip overlies the superior cavoatrial junction. Unchanged cardiomediastinal silhouette. Pulmonary hyperinflation. Emphysema and bronchial wall thickening. There is no focal airspace consolidation. No large pleural effusion or evidence of pneumothorax. Bones are unchanged. IMPRESSION: Findings of COPD.  No new airspace disease. Electronically Signed   By: Maurine Simmering M.D.   On: 11/01/2022 12:33        Scheduled Meds:  amLODipine  5 mg Oral QHS   arformoterol  15 mcg Nebulization BID   aspirin EC  81 mg Oral Q breakfast   atorvastatin  40 mg Oral Daily   benzonatate  100 mg Oral TID   budesonide (PULMICORT) nebulizer solution  0.25 mg Nebulization BID   Chlorhexidine Gluconate Cloth  6 each Topical Q0600   enoxaparin (LOVENOX) injection  40 mg Subcutaneous Q24H   ferrous sulfate  325 mg Oral QODAY   gabapentin  600 mg Oral TID   levalbuterol  0.63 mg Nebulization TID   lidocaine  1 patch Transdermal Q24H   magnesium oxide  400 mg Oral BID   methylPREDNISolone (SOLU-MEDROL) injection  80 mg Intravenous Q12H   pantoprazole  40 mg Oral BID   primidone  50 mg Oral QHS     LOS: 6 days    Time spent: 35 minutes    Meisha Salone Darleen Crocker, DO [facility]  If 7PM-7AM, please contact night-coverage [email or web address] 11/03/2022, 9:55 AM

## 2022-11-03 NOTE — Plan of Care (Signed)
  Problem: Fluid Volume: Goal: Hemodynamic stability will improve Outcome: Progressing   Problem: Clinical Measurements: Goal: Diagnostic test results will improve Outcome: Progressing   

## 2022-11-04 ENCOUNTER — Other Ambulatory Visit: Payer: Self-pay

## 2022-11-04 DIAGNOSIS — J121 Respiratory syncytial virus pneumonia: Secondary | ICD-10-CM | POA: Diagnosis not present

## 2022-11-04 DIAGNOSIS — J441 Chronic obstructive pulmonary disease with (acute) exacerbation: Secondary | ICD-10-CM | POA: Diagnosis not present

## 2022-11-04 LAB — BASIC METABOLIC PANEL
Anion gap: 9 (ref 5–15)
BUN: 13 mg/dL (ref 8–23)
CO2: 40 mmol/L — ABNORMAL HIGH (ref 22–32)
Calcium: 8.3 mg/dL — ABNORMAL LOW (ref 8.9–10.3)
Chloride: 91 mmol/L — ABNORMAL LOW (ref 98–111)
Creatinine, Ser: 0.53 mg/dL (ref 0.44–1.00)
GFR, Estimated: >60 mL/min (ref >60)
Glucose, Bld: 162 mg/dL — ABNORMAL HIGH (ref 70–99)
Potassium: 3.6 mmol/L (ref 3.5–5.1)
Sodium: 140 mmol/L (ref 135–145)

## 2022-11-04 LAB — CBC
HCT: 25.6 % — ABNORMAL LOW (ref 36.0–46.0)
Hemoglobin: 7.9 g/dL — ABNORMAL LOW (ref 12.0–15.0)
MCH: 31.9 pg (ref 26.0–34.0)
MCHC: 30.9 g/dL (ref 30.0–36.0)
MCV: 103.2 fL — ABNORMAL HIGH (ref 80.0–100.0)
Platelets: 202 10*3/uL (ref 150–400)
RBC: 2.48 MIL/uL — ABNORMAL LOW (ref 3.87–5.11)
RDW: 16.7 % — ABNORMAL HIGH (ref 11.5–15.5)
WBC: 6.5 10*3/uL (ref 4.0–10.5)
nRBC: 1.2 % — ABNORMAL HIGH (ref 0.0–0.2)

## 2022-11-04 LAB — MAGNESIUM: Magnesium: 1.9 mg/dL (ref 1.7–2.4)

## 2022-11-04 MED ORDER — PANTOPRAZOLE SODIUM 40 MG PO TBEC
40.0000 mg | DELAYED_RELEASE_TABLET | Freq: Two times a day (BID) | ORAL | Status: DC
  Administered 2022-11-04 – 2022-11-08 (×8): 40 mg via ORAL
  Filled 2022-11-04 (×7): qty 1

## 2022-11-04 MED ORDER — DM-GUAIFENESIN ER 30-600 MG PO TB12
2.0000 | ORAL_TABLET | Freq: Two times a day (BID) | ORAL | Status: DC
  Administered 2022-11-04 – 2022-11-08 (×8): 2 via ORAL
  Filled 2022-11-04: qty 2
  Filled 2022-11-04: qty 1
  Filled 2022-11-04 (×5): qty 2
  Filled 2022-11-04 (×2): qty 1

## 2022-11-04 MED ORDER — REVEFENACIN 175 MCG/3ML IN SOLN
175.0000 ug | Freq: Every day | RESPIRATORY_TRACT | Status: DC
  Administered 2022-11-05 – 2022-11-08 (×4): 175 ug via RESPIRATORY_TRACT
  Filled 2022-11-04 (×5): qty 3

## 2022-11-04 NOTE — Progress Notes (Signed)
PROGRESS NOTE    [name]  VZS:[id number] DOB: 09/20/1956 DOA: 10/28/2022 PCP: [name], MD   Brief Narrative:    Patient is a 66 year old female with history of anemia, asthma, carotid stenosis, COPD on 3 L 02 at home, hypertension, hyperlipidemia, peripheral neuropathy, peripheral vascular disease who presented to the emergency department with acute onset of dyspnea, greenish sputum, wheezing, fever, chills.  On presentation she was tachycardic.  Found to be RSV positive.  Chest x-ray showed features of COPD, bronchiectasis, increased interstitial markings in the left lower lung field suggesting scarring/interstitial pneumonia.  Patient was admitted for the management of acute COPD exacerbation secondary to RSV and possible pneumonia.     Assessment & Plan:   Principal Problem:   COPD exacerbation ([place]) Active Problems:   Sepsis due to pneumonia [facility])   Essential hypertension   Dyslipidemia   Essential tremor   GERD without esophagitis   Peripheral neuropathy   RSV (respiratory syncytial virus pneumonia)  Assessment and Plan:  Acute COPD exacerbation: Presented with wheezing, shortness of breath.  On 3 L of oxygen at home.  Currently on 3 L.  She is a past smoker Continue bronchodilators, steroid therapy, mucolytic's.  Having right chest wall pain will added Lidoderm patch. -Continues to have persistent dyspnea requiring higher dose of IV steroids and close monitoring.  Chest x-ray 12/15 with no significant changes -Discussed case with pulmonology 12/15 with no need to add any other significant measures at this point, continue high-dose IV steroid -Added Brovana 12/16 -Appears to be slowly improving, but patient still complaining of significant dyspnea.  Will ask pulmonology to evaluate.   RSV infection: No need for antibiotics as procalcitonin is low and therefore will discontinue Rocephin and azithromycin-procalcitonin is still low   Acute on chronic hypoxic  respiratory failure:On 3L of  of oxygen per minute at home.  Currently on 5 L nasal cannula oxygen.   Peripheral neuropathy: Continue Neurontin   GERD: Continue PPI therapy   History of essential tremor: On primidone   Hyperlipidemia: Continue statin   Pancytopenia: Current hemoglobin stable in the range of 9-10.  Patient also has mild thrombocytopenia.  The numbers are better than her baseline.  She has history of pancytopenia thought to be secondary to myelosuppression from niraparib.  Follows with hematology/oncology   History of high-grade serous ovarian carcinoma: Follows with [name].was on niraparib   Hypertension: Continue current medications.  Monitor blood pressure   Weakness: PT evaluation with no home needs noted.     DVT prophylaxis:enoxaparin (LOVENOX) injection 40 mg Start: 10/29/22 1000       Code Status: Full Code   Family Communication: None at bedside   Patient status:Inpatient   Patient is from :Home   Anticipated discharge QG:BEEF   Estimated DC date:1-2 days     Consultants: None   Procedures:None  Antimicrobials:  Anti-infectives (From admission, onward)    Start     Dose/Rate Route Frequency Ordered Stop   10/29/22 2300  cefTRIAXone (ROCEPHIN) 2 g in sodium chloride 0.9 % 100 mL IVPB  Status:  Discontinued        2 g 200 mL/hr over 30 Minutes Intravenous Every 24 hours 10/29/22 0048 10/30/22 0723   10/29/22 0100  azithromycin (ZITHROMAX) 500 mg in sodium chloride 0.9 % 250 mL IVPB  Status:  Discontinued        500 mg 250 mL/hr over 60 Minutes Intravenous Every 24 hours 10/29/22 0048 10/29/22 0102   10/28/22  2330  azithromycin (ZITHROMAX) 500 mg in sodium chloride 0.9 % 250 mL IVPB  Status:  Discontinued        500 mg 250 mL/hr over 60 Minutes Intravenous Every 24 hours 10/28/22 2323 10/30/22 0723   10/28/22 2315  cefTRIAXone (ROCEPHIN) 1 g in sodium chloride 0.9 % 100 mL IVPB        1 g 200 mL/hr over 30 Minutes Intravenous  Once  10/28/22 2302 10/29/22 0041   10/28/22 2315  azithromycin (ZITHROMAX) 250 mg in dextrose 5 % 125 mL IVPB  Status:  Discontinued        250 mg 127.5 mL/hr over 60 Minutes Intravenous Every 24 hours 10/28/22 2302 10/28/22 2323      Subjective: Patient seen and evaluated today with ongoing shortness of breath.  She is stating that she feels as though she is not improving.  Objective: Vitals:   11/04/22 0748 11/04/22 0751 11/04/22 0757 11/04/22 0844  BP:    138/72  Pulse:    96  Resp:    (!) 22  Temp:    97.9 F (36.6 C)  TempSrc:    Oral  SpO2: 100% 100% 100% 97%  Weight:      Height:        Intake/Output Summary (Last 24 hours) at 11/04/2022 1002 Last data filed at 11/04/2022 0524 Gross per 24 hour  Intake 960 ml  Output --  Net 960 ml   Filed Weights   10/28/22 1616  Weight: 51.7 kg    Examination:  General exam: Appears calm and comfortable  Respiratory system: Clear to auscultation. Respiratory effort normal.  5 L nasal cannula Cardiovascular system: S1 & S2 heard, RRR.  Gastrointestinal system: Abdomen is soft Central nervous system: Alert and awake Extremities: No edema Skin: No significant lesions noted Psychiatry: Flat affect.    Data Reviewed: I have personally reviewed following labs and imaging studies  CBC: Recent Labs  Lab 10/28/22 1700 10/29/22 0432 10/30/22 0418 10/30/22 0936 10/31/22 0340 11/01/22 0344 11/02/22 0726 11/04/22 0411  WBC 9.9   < > 7.1  --  9.1 8.9 8.2 6.5  NEUTROABS 8.1*  --   --   --   --   --   --   --   HGB 10.1*   < > 7.9* 8.4* 7.6* 7.6* 8.0* 7.9*  HCT 30.7*   < > 24.5* 26.6* 24.5* 24.1* 24.7* 25.6*  MCV 99.0   < > 100.0  --  102.5* 100.8* 101.6* 103.2*  PLT 171   < > 140*  --  161 176 190 202   < > = values in this interval not displayed.   Basic Metabolic Panel: Recent Labs  Lab 10/29/22 0432 10/30/22 0418 10/31/22 0340 11/02/22 0726 11/04/22 0411  NA 139 139 136 139 140  K 3.1* 3.9 4.1 3.9 3.6  CL 101  102 93* 92* 91*  CO2 28 30 35* 36* 40*  GLUCOSE 125* 135* 156* 126* 162*  BUN 6* 6* '10 14 13  '$ CREATININE 0.51 0.40* 0.48 0.42* 0.53  CALCIUM 8.1* 8.0* 8.5* 8.4* 8.3*  MG 1.7  --  1.9 2.1 1.9   GFR: Estimated Creatinine Clearance: 51 mL/min (by C-G formula based on SCr of 0.53 mg/dL). Liver Function Tests: Recent Labs  Lab 10/28/22 1700  AST 24  ALT 14  ALKPHOS 93  BILITOT 0.4  PROT 6.4*  ALBUMIN 3.4*   No results for input(s): "LIPASE", "AMYLASE" in the last 168 hours. No results  for input(s): "AMMONIA" in the last 168 hours. Coagulation Profile: Recent Labs  Lab 10/29/22 0432  INR 0.9   Cardiac Enzymes: No results for input(s): "CKTOTAL", "CKMB", "CKMBINDEX", "TROPONINI" in the last 168 hours. BNP (last 3 results) No results for input(s): "PROBNP" in the last 8760 hours. HbA1C: No results for input(s): "HGBA1C" in the last 72 hours. CBG: No results for input(s): "GLUCAP" in the last 168 hours. Lipid Profile: No results for input(s): "CHOL", "HDL", "LDLCALC", "TRIG", "CHOLHDL", "LDLDIRECT" in the last 72 hours. Thyroid Function Tests: No results for input(s): "TSH", "T4TOTAL", "FREET4", "T3FREE", "THYROIDAB" in the last 72 hours. Anemia Panel: No results for input(s): "VITAMINB12", "FOLATE", "FERRITIN", "TIBC", "IRON", "RETICCTPCT" in the last 72 hours. Sepsis Labs: Recent Labs  Lab 10/28/22 1700 10/28/22 1913 10/29/22 0432 11/01/22 1048  PROCALCITON  --   --  <0.10 0.10  LATICACIDVEN 1.8 1.2  --   --     Recent Results (from the past 240 hour(s))  Culture, blood (Routine X 2) w Reflex to ID Panel     Status: None   Collection Time: 10/28/22  5:09 PM   Specimen: Left Antecubital  Result Value Ref Range Status   Specimen Description LEFT ANTECUBITAL  Final   Special Requests   Final    BOTTLES DRAWN AEROBIC AND ANAEROBIC Blood Culture adequate volume   Culture   Final    NO GROWTH 5 DAYS Performed at [facility], [address], [place], [place]  [address]    Report Status 11/02/2022 FINAL  Final  Culture, blood (routine x 2)     Status: None   Collection Time: 10/28/22  5:11 PM   Specimen: Right Antecubital; Blood  Result Value Ref Range Status   Specimen Description RIGHT ANTECUBITAL  Final   Special Requests   Final    BOTTLES DRAWN AEROBIC AND ANAEROBIC Blood Culture results may not be optimal due to an excessive volume of blood received in culture bottles   Culture   Final    NO GROWTH 5 DAYS Performed at [facility], [address] [place], [place] [address]    Report Status 11/02/2022 FINAL  Final  Resp panel by RT-PCR (RSV, Flu A&B, Covid) Anterior Nasal Swab     Status: Abnormal   Collection Time: 10/28/22 10:02 PM   Specimen: Anterior Nasal Swab  Result Value Ref Range Status   SARS Coronavirus 2 by RT PCR NEGATIVE NEGATIVE Final    Comment: (NOTE) SARS-CoV-2 target nucleic acids are NOT DETECTED.  The SARS-CoV-2 RNA is generally detectable in upper respiratory specimens during the acute phase of infection. The lowest concentration of SARS-CoV-2 viral copies this assay can detect is 138 copies/mL. A negative result does not preclude SARS-Cov-2 infection and should not be used as the sole basis for treatment or other patient management decisions. A negative result may occur with  improper specimen collection/handling, submission of specimen other than nasopharyngeal swab, presence of viral mutation(s) within the areas targeted by this assay, and inadequate number of viral copies(<138 copies/mL). A negative result must be combined with clinical observations, patient history, and epidemiological information. The expected result is Negative.  Fact Sheet for Patients:  EntrepreneurPulse.com.au  Fact Sheet for Healthcare Providers:  IncredibleEmployment.be  This test is no t yet approved or cleared by the [place] FDA and  has been authorized for detection and/or diagnosis of  SARS-CoV-2 by FDA under an Emergency Use Authorization (EUA). This EUA will remain  in effect (meaning this test can  be used) for the duration of the COVID-19 declaration under Section 564(b)(1) of the Act, 21 U.S.C.section 360bbb-3(b)(1), unless the authorization is terminated  or revoked sooner.       Influenza A by PCR NEGATIVE NEGATIVE Final   Influenza B by PCR NEGATIVE NEGATIVE Final    Comment: (NOTE) The Xpert Xpress SARS-CoV-2/FLU/RSV plus assay is intended as an aid in the diagnosis of influenza from Nasopharyngeal swab specimens and should not be used as a sole basis for treatment. Nasal washings and aspirates are unacceptable for Xpert Xpress SARS-CoV-2/FLU/RSV testing.  Fact Sheet for Patients: EntrepreneurPulse.com.au  Fact Sheet for Healthcare Providers: IncredibleEmployment.be  This test is not yet approved or cleared by the [place] FDA and has been authorized for detection and/or diagnosis of SARS-CoV-2 by FDA under an Emergency Use Authorization (EUA). This EUA will remain in effect (meaning this test can be used) for the duration of the COVID-19 declaration under Section 564(b)(1) of the Act, 21 U.S.C. section 360bbb-3(b)(1), unless the authorization is terminated or revoked.     Resp Syncytial Virus by PCR POSITIVE (A) NEGATIVE Final    Comment: (NOTE) Fact Sheet for Patients: EntrepreneurPulse.com.au  Fact Sheet for Healthcare Providers: IncredibleEmployment.be  This test is not yet approved or cleared by the [place] FDA and has been authorized for detection and/or diagnosis of SARS-CoV-2 by FDA under an Emergency Use Authorization (EUA). This EUA will remain in effect (meaning this test can be used) for the duration of the COVID-19 declaration under Section 564(b)(1) of the Act, 21 U.S.C. section 360bbb-3(b)(1), unless the authorization is terminated  or revoked.  Performed at [facility], [address], [place], [place] [address]          Radiology Studies: No results found.      Scheduled Meds:  amLODipine  5 mg Oral QHS   arformoterol  15 mcg Nebulization BID   aspirin EC  81 mg Oral Q breakfast   atorvastatin  40 mg Oral Daily   benzonatate  100 mg Oral TID   budesonide (PULMICORT) nebulizer solution  0.25 mg Nebulization BID   Chlorhexidine Gluconate Cloth  6 each Topical Q0600   enoxaparin (LOVENOX) injection  40 mg Subcutaneous Q24H   ferrous sulfate  325 mg Oral QODAY   gabapentin  600 mg Oral TID   levalbuterol  0.63 mg Nebulization TID   lidocaine  1 patch Transdermal Q24H   magnesium oxide  400 mg Oral BID   methylPREDNISolone (SOLU-MEDROL) injection  80 mg Intravenous Q12H   pantoprazole  40 mg Oral BID   primidone  50 mg Oral QHS     LOS: 7 days    Time spent: 35 minutes    Kamica Florance Darleen Crocker, DO [facility]  If 7PM-7AM, please contact night-coverage [email or web address] 11/04/2022, 10:02 AM

## 2022-11-04 NOTE — Plan of Care (Signed)
  Problem: Fluid Volume: Goal: Hemodynamic stability will improve Outcome: Progressing   Problem: Clinical Measurements: Goal: Diagnostic test results will improve Outcome: Progressing   

## 2022-11-04 NOTE — Consult Note (Signed)
NAME:  [name], MRN:  [id number], DOB:  Apr 27, 1956, LOS: 7 ADMISSION DATE:  10/28/2022, CONSULTATION DATE:  12/18 REFERRING MD:  Manuella Ghazi  CHIEF COMPLAINT:  sob    History of Present Illness:  20 yowf remote smoker pt of Dr Gust Brooms  with copd GOLD 4  criteria and housbound at baseline/ 02 dep / pred dep at 5 mg floor (20 mg ceiling) with freq exac  admitted 10/28/22 with aecopd in setting of RSV pneumonia and PCCM service asked to see am 12/18 for refractory sob     Pertinent  Medical History  [name] is a 66 y.o. female with medical history significant of ovarian cancer on oral chemotherapy ([place]), COPD with supplemental oxygen via [place] at 3 LPM at baseline, hypertension, hyperlipidemia who presened to the emergency department due to 2-day onset of cough and increasing shortness of breath requiring her to increase her supplemental oxygen on ambulation.  Patient states that she could barely walk across the room without being short of breath.  Patient also complained of about a week of lightheadedness and dizziness.  ED Course:  In the emergency department, she was tachypneic and initial pulse was 103 bpm and other vital signs were within normal range.  Work-up in the ED showed pancytopenia with H/H 6.7/20.3 (this was 8.3/25.2 on 08/27/2022).  WBC 1.6, MCV 101.0, platelets 34.  BMP showed sodium of 134, potassium 3.4, chloride 93, bicarb 31, glucose 117, BUN 11, creatinine 0.66, BNP 18.0, FOBT negative.  Influenza A, B, SARS coronavirus 2 was negative. CT angiography chest with contrast was negative for acute pulmonary embolus or aortic dissection.  Emphysema.  No acute airspace disease.  Mild bronchiectasis and bronchial wall thickening with scattered clustered centrilobular nodules, stable to recent priors.  No enlarging pulmonary nodules. CT head without contrast showed no acute intracranial abnormality   Breathing treatment was provided, Solu-Medrol was given, IV hydration was  provided.  1 unit of blood was ordered to be transfused in the ED.  Hospitalist was asked to admit patient for further evaluation and management.  Significant Hospital Events: Including procedures, antibiotic start and stop dates in addition to other pertinent events   RVP   10/28/22  RSV  POS   Scheduled Meds:  amLODipine  5 mg Oral QHS   arformoterol  15 mcg Nebulization BID   aspirin EC  81 mg Oral Q breakfast   atorvastatin  40 mg Oral Daily   benzonatate  100 mg Oral TID   budesonide (PULMICORT) nebulizer solution  0.25 mg Nebulization BID   Chlorhexidine Gluconate Cloth  6 each Topical Q0600   enoxaparin (LOVENOX) injection  40 mg Subcutaneous Q24H   ferrous sulfate  325 mg Oral QODAY   gabapentin  600 mg Oral TID   levalbuterol  0.63 mg Nebulization TID   lidocaine  1 patch Transdermal Q24H   magnesium oxide  400 mg Oral BID   methylPREDNISolone (SOLU-MEDROL) injection  80 mg Intravenous Q12H   pantoprazole  40 mg Oral BID   primidone  50 mg Oral QHS   Continuous Infusions: PRN Meds:.acetaminophen **OR** acetaminophen, albuterol, guaiFENesin-dextromethorphan, magnesium hydroxide, ondansetron **OR** ondansetron (ZOFRAN) IV, phenol, traZODone    Interim History / Subjective:  Congested rattling cough/ min productive slt yellowish mucus, able to get up in chair for a few hours this am  Objective   Blood pressure 138/72, pulse 96, temperature 97.9 F (36.6 C), temperature source Oral, resp. rate (!) 22, height 5' (1.524 m),  weight 51.7 kg, SpO2 97 %.        Intake/Output Summary (Last 24 hours) at 11/04/2022 1253 Last data filed at 11/04/2022 0524 Gross per 24 hour  Intake 720 ml  Output --  Net 720 ml   Filed Weights   10/28/22 1616  Weight: 51.7 kg    Examination: Tmax:  98.3 General appearance:    chronically ill sitting up in bed a 60 degrees nad  At Rest 02 sats  97% on 5lpm   No jvd Oropharynx clear,  mucosa nl Neck supple Lungs with a few scattered  exp > insp rhonchi bilaterally/ barrel chest  RRR no s3 or or sign murmur Abd soft with limited excursion  Extr warm with no edema or clubbing noted Neuro  Sensorium intact,  no apparent motor deficits    I personally reviewed images and agree with radiology impression as follows:  CXR:   portable 12/15 Findings of COPD.  No new airspace disease.    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  >>> add yupelri to formoterol/ budesonide  >>> mucinex dm 1200 mg bid and flutter valve >>> max rx for gerd >>> agree with solumedrol 80 mg IM q 12 h   2) Acute on chronic hypoxemic and hypercarbic resp failure  >>> target 02 sats low 90s in this setting  Though somewhat paradoxic, when the lung fails to clear C02 properly and pC02 rises the lung then becomes a more efficient scavenger of C02 allowing lower work of breathing and  better C02 clearance albeit at a higher serum pC02 level - this is why pts can look a lot better than their ABG's would suggest and why it's so difficult to prognosticate endstage dz.  It's also why I strongly rec DNI status (ventilating pts down to a nl pC02 adversely affects this compensatory mechanism)  - others have used NIV in this setting in pts who tolerate it but then this interferes with natures buffering mechanism as above/  3) Pancytopenia with anemia of greatest concern for pt with hypoxemic resp failure at baseline >>> w/u and rx per [facility], may want to use a threshold of 8 gm in her case  Best Practice (right click and "Reselect all SmartList Selections" daily)    Per [facility]  Labs   CBC: Recent Labs  Lab 10/28/22 1700 10/29/22 0432 10/30/22 0418 10/30/22 0936 10/31/22 0340 11/01/22 0344 11/02/22 0726 11/04/22 0411  WBC 9.9   < > 7.1  --  9.1 8.9 8.2 6.5  NEUTROABS 8.1*  --   --   --   --   --   --   --   HGB 10.1*   < > 7.9* 8.4* 7.6* 7.6* 8.0* 7.9*  HCT 30.7*   < > 24.5* 26.6* 24.5* 24.1* 24.7* 25.6*  MCV  99.0   < > 100.0  --  102.5* 100.8* 101.6* 103.2*  PLT 171   < > 140*  --  161 176 190 202   < > = values in this interval not displayed.    Basic Metabolic Panel: Recent Labs  Lab 10/29/22 0432 10/30/22 0418 10/31/22 0340 11/02/22 0726 11/04/22 0411  NA 139 139 136 139 140  K 3.1* 3.9 4.1 3.9 3.6  CL 101 102 93* 92* 91*  CO2 28 30 35* 36* 40*  GLUCOSE 125* 135* 156* 126* 162*  BUN 6* 6* '10 14 13  '$ CREATININE 0.51 0.40* 0.48 0.42*  0.53  CALCIUM 8.1* 8.0* 8.5* 8.4* 8.3*  MG 1.7  --  1.9 2.1 1.9   GFR: Estimated Creatinine Clearance: 51 mL/min (by C-G formula based on SCr of 0.53 mg/dL). Recent Labs  Lab 10/28/22 1700 10/28/22 1913 10/29/22 0432 10/30/22 0418 10/31/22 0340 11/01/22 0344 11/01/22 1048 11/02/22 0726 11/04/22 0411  PROCALCITON  --   --  <0.10  --   --   --  0.10  --   --   WBC 9.9  --  8.4   < > 9.1 8.9  --  8.2 6.5  LATICACIDVEN 1.8 1.2  --   --   --   --   --   --   --    < > = values in this interval not displayed.    Liver Function Tests: Recent Labs  Lab 10/28/22 1700  AST 24  ALT 14  ALKPHOS 93  BILITOT 0.4  PROT 6.4*  ALBUMIN 3.4*   No results for input(s): "LIPASE", "AMYLASE" in the last 168 hours. No results for input(s): "AMMONIA" in the last 168 hours.  ABG    Component Value Date/Time   PHART 7.45 07/31/2022 2109   PCO2ART 49 (H) 07/31/2022 2109   PO2ART 82 (L) 07/31/2022 2109   HCO3 35.9 (H) 10/28/2022 2327   ACIDBASEDEF 2.1 (H) 01/18/2016 1250   O2SAT 82.6 10/28/2022 2327     Coagulation Profile: Recent Labs  Lab 10/29/22 0432  INR 0.9    Cardiac Enzymes: No results for input(s): "CKTOTAL", "CKMB", "CKMBINDEX", "TROPONINI" in the last 168 hours.  HbA1C: Hemoglobin A1C  Date/Time Value Ref Range Status  11/01/2013 05:58 AM 5.6 4.2 - 6.3 % Final    Comment:    The American Diabetes Association recommends that a primary goal of therapy should be <7% and that physicians should reevaluate the treatment regimen in  patients with HbA1c values consistently >8%.     CBG: No results for input(s): "GLUCAP" in the last 168 hours.     Past Medical History:  She,  has a past medical history of Anemia, Aortic atherosclerosis ([place]), Arthritis, Asthma, Bilateral carotid artery stenosis, Cancer ([place]), COPD (chronic obstructive pulmonary disease) ([place]), High cholesterol, Hyperlipidemia, Hypertension, Neuropathy, Osteoarthritis, Ovarian cancer ([place]), Oxygen dependent, Peripheral vascular disease ([place]), Peritoneal carcinoma ([place]), Pneumonia (2019), Port-A-Cath in place (07/03/2020), and Pulmonary emphysema ([place]).   Surgical History:   Past Surgical History:  Procedure Laterality Date   BIOPSY  06/30/2022   Procedure: BIOPSY;  Surgeon: Eloise Harman, DO;  [facility];  Service: Endoscopy;;  gastric   BIOPSY  09/03/2022   Procedure: BIOPSY;  Surgeon: Harvel Quale, MD;  [facility];  Service: Gastroenterology;;   COLONOSCOPY WITH PROPOFOL N/A 09/03/2022   Procedure: COLONOSCOPY WITH PROPOFOL;  Surgeon: Harvel Quale, MD;  [facility];  Service: Gastroenterology;  Laterality: N/A;  900 ASA 3   ESOPHAGEAL BRUSHING  09/03/2022   Procedure: ESOPHAGEAL BRUSHING;  Surgeon: Montez Morita, Quillian Quince, MD;  [facility];  Service: Gastroenterology;;   ESOPHAGOGASTRODUODENOSCOPY (EGD) WITH PROPOFOL N/A 02/17/2021   Procedure: ESOPHAGOGASTRODUODENOSCOPY (EGD) WITH PROPOFOL;  Surgeon: Harvel Quale, MD;  [facility];  Service: Gastroenterology;  Laterality: N/A;   ESOPHAGOGASTRODUODENOSCOPY (EGD) WITH PROPOFOL N/A 05/04/2022   Procedure: ESOPHAGOGASTRODUODENOSCOPY (EGD) WITH PROPOFOL;  Surgeon: Daneil Dolin, MD;  [facility];  Service: Endoscopy;  Laterality: N/A;   ESOPHAGOGASTRODUODENOSCOPY (EGD) WITH PROPOFOL N/A 06/30/2022   Procedure: ESOPHAGOGASTRODUODENOSCOPY (EGD)  WITH PROPOFOL;  Surgeon: Eloise Harman, DO;   [facility];  Service: Endoscopy;  Laterality: N/A;   ESOPHAGOGASTRODUODENOSCOPY (EGD) WITH PROPOFOL N/A 09/03/2022   Procedure: ESOPHAGOGASTRODUODENOSCOPY (EGD) WITH PROPOFOL;  Surgeon: Harvel Quale, MD;  [facility];  Service: Gastroenterology;  Laterality: N/A;   HALLUX VALGUS BASE WEDGE Right 06/09/2015   Procedure: Base wedge osteotomy with modified McBride right foot ;  Surgeon: Sharlotte Alamo, MD;  Location: [facility];  Service: Podiatry;  Laterality: Right;   PORTACATH PLACEMENT Left 06/28/2020   Procedure: PORT-A-CATHETER PLACEMENT LEFT CHEST (attached catheter in left subclavian);  Surgeon: Virl Cagey, MD;  Location: [facility];  Service: General;  Laterality: Left;   TUBAL LIGATION     VIDEO BRONCHOSCOPY WITH ENDOBRONCHIAL NAVIGATION N/A 03/09/2021   Procedure: VIDEO BRONCHOSCOPY WITH ENDOBRONCHIAL NAVIGATION;  Surgeon: Ottie Glazier, MD;  Location: [facility];  Service: Thoracic;  Laterality: N/A;   VIDEO BRONCHOSCOPY WITH ENDOBRONCHIAL ULTRASOUND N/A 03/09/2021   Procedure: VIDEO BRONCHOSCOPY WITH ENDOBRONCHIAL ULTRASOUND;  Surgeon: Ottie Glazier, MD;  Location: [facility];  Service: Thoracic;  Laterality: N/A;     Social History:   reports that she quit smoking about 9 years ago. Her smoking use included cigarettes. She has a 60.00 pack-year smoking history. She has never used smokeless tobacco. She reports that she does not drink alcohol and does not use drugs.   Family History:  Her family history includes Alzheimer's disease in her maternal grandmother and mother; COPD in her father; Emphysema in her father; Healthy in her brother, sister, sister, sister, and sister; Hypertension in her father; Prostate cancer in an other family member. There is no history of Breast cancer.   Allergies No Known Allergies   Home Medications  Prior to Admission medications   Medication Sig Start Date End Date Taking? Authorizing Provider  albuterol (VENTOLIN  HFA) 108 (90 Base) MCG/ACT inhaler Inhale 2 puffs into the lungs every 6 (six) hours as needed for wheezing or shortness of breath. 07/01/22  Yes Emokpae, Courage, MD  amLODipine (NORVASC) 5 MG tablet Take 5 mg by mouth at bedtime.    Yes [provider]  aspirin EC 81 MG tablet Take 1 tablet (81 mg total) by mouth daily with breakfast. 07/01/22  Yes Emokpae, Courage, MD  atorvastatin (LIPITOR) 40 MG tablet Take 1 tablet (40 mg total) by mouth daily. 01/02/22  Yes Dessa Phi, DO  Budeson-Glycopyrrol-Formoterol (BREZTRI AEROSPHERE) 160-9-4.8 MCG/ACT AERO Inhale 2 puffs into the lungs 2 (two) times daily. 09/10/22  Yes Tanda Rockers, MD  ferrous sulfate 325 (65 FE) MG tablet Take 325 mg by mouth every other day.   Yes [provider]  gabapentin (NEURONTIN) 600 MG tablet Take 1 tablet by mouth 3 (three) times daily. 10/17/22  Yes [provider]  ipratropium (ATROVENT HFA) 17 MCG/ACT inhaler Inhale 2 puffs into the lungs every 6 (six) hours as needed for wheezing.   Yes [provider]  levalbuterol (XOPENEX) 1.25 MG/3ML nebulizer solution Take 1.25 mg by nebulization every 6 (six) hours as needed for shortness of breath or wheezing. 09/28/21  Yes [provider]  magnesium oxide (MAG-OX) 400 (240 Mg) MG tablet Take 1 tablet (400 mg total) by mouth 2 (two) times daily. 09/06/22  Yes Derek Jack, MD  moxifloxacin (AVELOX) 400 MG tablet Take 400 mg by mouth daily. 10/28/22  Yes [provider]  nortriptyline (PAMELOR) 25 MG capsule Take 25 mg by mouth at bedtime.   Yes  [provider]  OXYGEN Inhale 3 L into the lungs continuous.   Yes [provider]  pantoprazole (PROTONIX) 40 MG tablet Take 1 tablet (40 mg total) by mouth 2 (two) times daily. 07/01/22 10/28/22 Yes Emokpae, Courage, MD  predniSONE (DELTASONE) 5 MG tablet Take 5 mg by mouth daily with breakfast.   Yes [provider]  primidone (MYSOLINE) 50 MG  tablet Take 50 mg by mouth at bedtime. 01/30/22  Yes [provider]  nystatin (MYCOSTATIN) 100000 UNIT/ML suspension Take by mouth. Patient not taking: Reported on 10/28/2022 09/24/22   [provider]

## 2022-11-05 DIAGNOSIS — J441 Chronic obstructive pulmonary disease with (acute) exacerbation: Secondary | ICD-10-CM | POA: Diagnosis not present

## 2022-11-05 NOTE — Progress Notes (Signed)
PROGRESS NOTE    JASSLYN FINKEL  TKW:[id number] DOB: [birth year] DOA: 10/28/2022 PCP: [name], MD   Brief Narrative:    Patient is a 66 year old female with history of anemia, asthma, carotid stenosis, COPD on 3 L 02 at home, hypertension, hyperlipidemia, peripheral neuropathy, peripheral vascular disease who presented to the emergency department with acute onset of dyspnea, greenish sputum, wheezing, fever, chills.  On presentation she was tachycardic.  Found to be RSV positive.  Chest x-ray showed features of COPD, bronchiectasis, increased interstitial markings in the left lower lung field suggesting scarring/interstitial pneumonia.  Patient was admitted for the management of acute COPD exacerbation secondary to RSV and possible pneumonia.   She is showing slow signs of improvement and pulmonology is following.  Anticipate discharge in the next 24-48 hours once Solu-Medrol can be weaned.  Assessment & Plan:   Principal Problem:   COPD exacerbation ([place]) Active Problems:   Sepsis due to pneumonia [facility])   Essential hypertension   Acute on chronic respiratory failure with hypoxia and hypercapnia (HCC)   Dyslipidemia   Essential tremor   GERD without esophagitis   Peripheral neuropathy   RSV (respiratory syncytial virus pneumonia)  Assessment and Plan:   Acute COPD exacerbation: Presented with wheezing, shortness of breath.  On 3 L of oxygen at home.  Currently on 3 L.  She is a past smoker Continue bronchodilators, steroid therapy, mucolytic's.  Having right chest wall pain will added Lidoderm patch. -Continues to have persistent dyspnea requiring higher dose of IV steroids and close monitoring.  Chest x-ray 12/15 with no significant changes -Discussed case with pulmonology 12/15 with no need to add any other significant measures at this point, continue high-dose IV steroid -Added Brovana 12/16 -Appears to be slowly improving -Pulmonology has seen on 12/18 with addition of  Yupelri -Continue aggressive IV steroids for now until further improvement and wean and likely can be discharged within the next 48 hours -Appears to be end-stage COPD and will require outpatient palliative consultation for further discussion of goals of care   RSV infection: No need for antibiotics as procalcitonin is low and therefore will discontinue Rocephin and azithromycin-procalcitonin is still low   Acute on chronic hypoxic respiratory failure:On 3L of  of oxygen per minute at home.  Currently on 5 L nasal cannula oxygen.   Peripheral neuropathy: Continue Neurontin   GERD: Continue PPI therapy   History of essential tremor: On primidone   Hyperlipidemia: Continue statin   Pancytopenia: Current hemoglobin stable in the range of 9-10.  Patient also has mild thrombocytopenia.  The numbers are better than her baseline.  She has history of pancytopenia thought to be secondary to myelosuppression from niraparib.  Follows with hematology/oncology   History of high-grade serous ovarian carcinoma: Follows with [name].was on niraparib   Hypertension: Continue current medications.  Monitor blood pressure   Weakness: PT evaluation with no home needs noted.     DVT prophylaxis:enoxaparin (LOVENOX) injection 40 mg Start: 10/29/22 1000       Code Status: Full Code   Family Communication: Discussed with daughter on phone 12/19   Patient status:Inpatient   Patient is from :Home   Anticipated discharge JM:EQAS   Estimated DC date:1-2 days     Consultants: Pulmonology   Procedures:None   Antimicrobials:  Anti-infectives (From admission, onward)    Start     Dose/Rate Route Frequency Ordered Stop   10/29/22 2300  cefTRIAXone (ROCEPHIN) 2 g in sodium chloride 0.9 %  100 mL IVPB  Status:  Discontinued        2 g 200 mL/hr over 30 Minutes Intravenous Every 24 hours 10/29/22 0048 10/30/22 0723   10/29/22 0100  azithromycin (ZITHROMAX) 500 mg in sodium chloride 0.9 % 250 mL  IVPB  Status:  Discontinued        500 mg 250 mL/hr over 60 Minutes Intravenous Every 24 hours 10/29/22 0048 10/29/22 0102   10/28/22 2330  azithromycin (ZITHROMAX) 500 mg in sodium chloride 0.9 % 250 mL IVPB  Status:  Discontinued        500 mg 250 mL/hr over 60 Minutes Intravenous Every 24 hours 10/28/22 2323 10/30/22 0723   10/28/22 2315  cefTRIAXone (ROCEPHIN) 1 g in sodium chloride 0.9 % 100 mL IVPB        1 g 200 mL/hr over 30 Minutes Intravenous  Once 10/28/22 2302 10/29/22 0041   10/28/22 2315  azithromycin (ZITHROMAX) 250 mg in dextrose 5 % 125 mL IVPB  Status:  Discontinued        250 mg 127.5 mL/hr over 60 Minutes Intravenous Every 24 hours 10/28/22 2302 10/28/22 2323       Subjective: Patient seen and evaluated today with slight improvement in overall respiratory status noted.  She still continues to have some ongoing dyspnea however.  Objective: Vitals:   11/04/22 2222 11/05/22 0446 11/05/22 0711 11/05/22 0851  BP:  (!) 146/72    Pulse:  (!) 101 97 100  Resp:  '16 20 20  '$ Temp:  97.6 F (36.4 C)    TempSrc:      SpO2: 93% 96% 94% 93%  Weight:      Height:        Intake/Output Summary (Last 24 hours) at 11/05/2022 1255 Last data filed at 11/05/2022 0900 Gross per 24 hour  Intake 240 ml  Output --  Net 240 ml   Filed Weights   10/28/22 1616  Weight: 51.7 kg    Examination:  General exam: Appears calm and comfortable  Respiratory system: Clear to auscultation. Respiratory effort minimally increased.  3 L nasal cannula. Cardiovascular system: S1 & S2 heard, RRR.  Gastrointestinal system: Abdomen is soft Central nervous system: Alert and awake Extremities: No edema Skin: No significant lesions noted Psychiatry: Flat affect.    Data Reviewed: I have personally reviewed following labs and imaging studies  CBC: Recent Labs  Lab 10/30/22 0418 10/30/22 0936 10/31/22 0340 11/01/22 0344 11/02/22 0726 11/04/22 0411  WBC 7.1  --  9.1 8.9 8.2 6.5  HGB  7.9* 8.4* 7.6* 7.6* 8.0* 7.9*  HCT 24.5* 26.6* 24.5* 24.1* 24.7* 25.6*  MCV 100.0  --  102.5* 100.8* 101.6* 103.2*  PLT 140*  --  161 176 190 790   Basic Metabolic Panel: Recent Labs  Lab 10/30/22 0418 10/31/22 0340 11/02/22 0726 11/04/22 0411  NA 139 136 139 140  K 3.9 4.1 3.9 3.6  CL 102 93* 92* 91*  CO2 30 35* 36* 40*  GLUCOSE 135* 156* 126* 162*  BUN 6* '10 14 13  '$ CREATININE 0.40* 0.48 0.42* 0.53  CALCIUM 8.0* 8.5* 8.4* 8.3*  MG  --  1.9 2.1 1.9   GFR: Estimated Creatinine Clearance: 51 mL/min (by C-G formula based on SCr of 0.53 mg/dL). Liver Function Tests: No results for input(s): "AST", "ALT", "ALKPHOS", "BILITOT", "PROT", "ALBUMIN" in the last 168 hours. No results for input(s): "LIPASE", "AMYLASE" in the last 168 hours. No results for input(s): "AMMONIA" in the last 168 hours.  Coagulation Profile: No results for input(s): "INR", "PROTIME" in the last 168 hours. Cardiac Enzymes: No results for input(s): "CKTOTAL", "CKMB", "CKMBINDEX", "TROPONINI" in the last 168 hours. BNP (last 3 results) No results for input(s): "PROBNP" in the last 8760 hours. HbA1C: No results for input(s): "HGBA1C" in the last 72 hours. CBG: No results for input(s): "GLUCAP" in the last 168 hours. Lipid Profile: No results for input(s): "CHOL", "HDL", "LDLCALC", "TRIG", "CHOLHDL", "LDLDIRECT" in the last 72 hours. Thyroid Function Tests: No results for input(s): "TSH", "T4TOTAL", "FREET4", "T3FREE", "THYROIDAB" in the last 72 hours. Anemia Panel: No results for input(s): "VITAMINB12", "FOLATE", "FERRITIN", "TIBC", "IRON", "RETICCTPCT" in the last 72 hours. Sepsis Labs: Recent Labs  Lab 11/01/22 1048  PROCALCITON 0.10    Recent Results (from the past 240 hour(s))  Culture, blood (Routine X 2) w Reflex to ID Panel     Status: None   Collection Time: 10/28/22  5:09 PM   Specimen: Left Antecubital  Result Value Ref Range Status   Specimen Description LEFT ANTECUBITAL  Final   Special  Requests   Final    BOTTLES DRAWN AEROBIC AND ANAEROBIC Blood Culture adequate volume   Culture   Final    NO GROWTH 5 DAYS Performed at [facility], [address], [place], [place] [address]    Report Status 11/02/2022 FINAL  Final  Culture, blood (routine x 2)     Status: None   Collection Time: 10/28/22  5:11 PM   Specimen: Right Antecubital; Blood  Result Value Ref Range Status   Specimen Description RIGHT ANTECUBITAL  Final   Special Requests   Final    BOTTLES DRAWN AEROBIC AND ANAEROBIC Blood Culture results may not be optimal due to an excessive volume of blood received in culture bottles   Culture   Final    NO GROWTH 5 DAYS Performed at [facility], [address], [place], [place] [address]    Report Status 11/02/2022 FINAL  Final  Resp panel by RT-PCR (RSV, Flu A&B, Covid) Anterior Nasal Swab     Status: Abnormal   Collection Time: 10/28/22 10:02 PM   Specimen: Anterior Nasal Swab  Result Value Ref Range Status   SARS Coronavirus 2 by RT PCR NEGATIVE NEGATIVE Final    Comment: (NOTE) SARS-CoV-2 target nucleic acids are NOT DETECTED.  The SARS-CoV-2 RNA is generally detectable in upper respiratory specimens during the acute phase of infection. The lowest concentration of SARS-CoV-2 viral copies this assay can detect is 138 copies/mL. A negative result does not preclude SARS-Cov-2 infection and should not be used as the sole basis for treatment or other patient management decisions. A negative result may occur with  improper specimen collection/handling, submission of specimen other than nasopharyngeal swab, presence of viral mutation(s) within the areas targeted by this assay, and inadequate number of viral copies(<138 copies/mL). A negative result must be combined with clinical observations, patient history, and epidemiological information. The expected result is Negative.  Fact Sheet for Patients:  EntrepreneurPulse.com.au  Fact Sheet for  Healthcare Providers:  IncredibleEmployment.be  This test is no t yet approved or cleared by the [place] FDA and  has been authorized for detection and/or diagnosis of SARS-CoV-2 by FDA under an Emergency Use Authorization (EUA). This EUA will remain  in effect (meaning this test can be used) for the duration of the COVID-19 declaration under Section 564(b)(1) of the Act, 21 U.S.C.section 360bbb-3(b)(1), unless the authorization is terminated  or revoked sooner.  Influenza A by PCR NEGATIVE NEGATIVE Final   Influenza B by PCR NEGATIVE NEGATIVE Final    Comment: (NOTE) The Xpert Xpress SARS-CoV-2/FLU/RSV plus assay is intended as an aid in the diagnosis of influenza from Nasopharyngeal swab specimens and should not be used as a sole basis for treatment. Nasal washings and aspirates are unacceptable for Xpert Xpress SARS-CoV-2/FLU/RSV testing.  Fact Sheet for Patients: EntrepreneurPulse.com.au  Fact Sheet for Healthcare Providers: IncredibleEmployment.be  This test is not yet approved or cleared by the [place] FDA and has been authorized for detection and/or diagnosis of SARS-CoV-2 by FDA under an Emergency Use Authorization (EUA). This EUA will remain in effect (meaning this test can be used) for the duration of the COVID-19 declaration under Section 564(b)(1) of the Act, 21 U.S.C. section 360bbb-3(b)(1), unless the authorization is terminated or revoked.     Resp Syncytial Virus by PCR POSITIVE (A) NEGATIVE Final    Comment: (NOTE) Fact Sheet for Patients: EntrepreneurPulse.com.au  Fact Sheet for Healthcare Providers: IncredibleEmployment.be  This test is not yet approved or cleared by the [place] FDA and has been authorized for detection and/or diagnosis of SARS-CoV-2 by FDA under an Emergency Use Authorization (EUA). This EUA will remain in effect (meaning  this test can be used) for the duration of the COVID-19 declaration under Section 564(b)(1) of the Act, 21 U.S.C. section 360bbb-3(b)(1), unless the authorization is terminated or revoked.  Performed at [facility], [address] [place], [place] [address]          Radiology Studies: No results found.      Scheduled Meds:  amLODipine  5 mg Oral QHS   arformoterol  15 mcg Nebulization BID   aspirin EC  81 mg Oral Q breakfast   atorvastatin  40 mg Oral Daily   benzonatate  100 mg Oral TID   budesonide (PULMICORT) nebulizer solution  0.25 mg Nebulization BID   Chlorhexidine Gluconate Cloth  6 each Topical Q0600   dextromethorphan-guaiFENesin  2 tablet Oral BID   enoxaparin (LOVENOX) injection  40 mg Subcutaneous Q24H   ferrous sulfate  325 mg Oral QODAY   gabapentin  600 mg Oral TID   levalbuterol  0.63 mg Nebulization TID   lidocaine  1 patch Transdermal Q24H   magnesium oxide  400 mg Oral BID   methylPREDNISolone (SOLU-MEDROL) injection  80 mg Intravenous Q12H   pantoprazole  40 mg Oral BID AC   primidone  50 mg Oral QHS   revefenacin  175 mcg Nebulization Daily     LOS: 8 days    Time spent: 35 minutes    Saudia Smyser Darleen Crocker, DO [facility]  If 7PM-7AM, please contact night-coverage [email or web address] 11/05/2022, 12:55 PM

## 2022-11-06 DIAGNOSIS — J121 Respiratory syncytial virus pneumonia: Secondary | ICD-10-CM | POA: Diagnosis not present

## 2022-11-06 DIAGNOSIS — I10 Essential (primary) hypertension: Secondary | ICD-10-CM | POA: Diagnosis not present

## 2022-11-06 DIAGNOSIS — J9621 Acute and chronic respiratory failure with hypoxia: Secondary | ICD-10-CM | POA: Diagnosis not present

## 2022-11-06 DIAGNOSIS — J9622 Acute and chronic respiratory failure with hypercapnia: Secondary | ICD-10-CM

## 2022-11-06 DIAGNOSIS — G250 Essential tremor: Secondary | ICD-10-CM

## 2022-11-06 DIAGNOSIS — J441 Chronic obstructive pulmonary disease with (acute) exacerbation: Secondary | ICD-10-CM | POA: Diagnosis not present

## 2022-11-06 LAB — CBC
HCT: 24.7 % — ABNORMAL LOW (ref 36.0–46.0)
Hemoglobin: 7.5 g/dL — ABNORMAL LOW (ref 12.0–15.0)
MCH: 31.8 pg (ref 26.0–34.0)
MCHC: 30.4 g/dL (ref 30.0–36.0)
MCV: 104.7 fL — ABNORMAL HIGH (ref 80.0–100.0)
Platelets: 182 10*3/uL (ref 150–400)
RBC: 2.36 MIL/uL — ABNORMAL LOW (ref 3.87–5.11)
RDW: 17.5 % — ABNORMAL HIGH (ref 11.5–15.5)
WBC: 7.8 10*3/uL (ref 4.0–10.5)
nRBC: 0.8 % — ABNORMAL HIGH (ref 0.0–0.2)

## 2022-11-06 LAB — BASIC METABOLIC PANEL
Anion gap: 7 (ref 5–15)
BUN: 14 mg/dL (ref 8–23)
CO2: 37 mmol/L — ABNORMAL HIGH (ref 22–32)
Calcium: 8 mg/dL — ABNORMAL LOW (ref 8.9–10.3)
Chloride: 97 mmol/L — ABNORMAL LOW (ref 98–111)
Creatinine, Ser: 0.45 mg/dL (ref 0.44–1.00)
GFR, Estimated: >60 mL/min (ref >60)
Glucose, Bld: 145 mg/dL — ABNORMAL HIGH (ref 70–99)
Potassium: 4 mmol/L (ref 3.5–5.1)
Sodium: 141 mmol/L (ref 135–145)

## 2022-11-06 LAB — MAGNESIUM: Magnesium: 1.9 mg/dL (ref 1.7–2.4)

## 2022-11-06 MED ORDER — GUAIFENESIN-DM 100-10 MG/5ML PO SYRP
5.0000 mL | ORAL_SOLUTION | ORAL | Status: DC | PRN
  Administered 2022-11-06 – 2022-11-07 (×3): 5 mL via ORAL
  Filled 2022-11-06 (×4): qty 5

## 2022-11-06 NOTE — Progress Notes (Signed)
PROGRESS NOTE    [name]  ZOX:[id number] DOB: August 23, 1956 DOA: 10/28/2022 PCP: [name], MD   Brief Narrative:    Patient is a 66 year old female with history of anemia, asthma, carotid stenosis, COPD on 3 L 02 at home, hypertension, hyperlipidemia, peripheral neuropathy, peripheral vascular disease who presented to the emergency department with acute onset of dyspnea, greenish sputum, wheezing, fever, chills.  On presentation she was tachycardic.  Found to be RSV positive.  Chest x-ray showed features of COPD, bronchiectasis, increased interstitial markings in the left lower lung field suggesting scarring/interstitial pneumonia.  Patient was admitted for the management of acute COPD exacerbation secondary to RSV and possible pneumonia.   She is showing slow signs of improvement and pulmonology is following.  Anticipate discharge in the next 24-48 hours once Solu-Medrol can be weaned.  Assessment & Plan:   Principal Problem:   COPD exacerbation ([place]) Active Problems:   Essential hypertension   Acute on chronic respiratory failure with hypoxia and hypercapnia (HCC)   Sepsis due to pneumonia (HCC)   Dyslipidemia   Essential tremor   GERD without esophagitis   Peripheral neuropathy   RSV (respiratory syncytial virus pneumonia)  Assessment and Plan:   Acute COPD exacerbation: Presented with wheezing, shortness of breath.  On 3 L of oxygen at home.  Currently on 3 L.  She is a past smoker Continue bronchodilators, steroid therapy, mucolytic's.  Having right chest wall pain will added Lidoderm patch. -Continues to have persistent dyspnea requiring higher dose of IV steroids and close monitoring.  Chest x-ray 12/15 with no significant changes -Discussed case with pulmonology 12/15 with no need to add any other significant measures at this point, continue high-dose IV steroid -Added Brovana 12/16 -Appears to be slowly improving -Pulmonology has seen on 12/18 with addition of  Yupelri -Continue aggressive IV steroids for now until further improvement and wean and likely can be discharged in 1-2 days -Appears to be end-stage COPD and will require outpatient palliative consultation for further discussion of goals of care   RSV infection: No need for antibiotics as procalcitonin is low and therefore will discontinue Rocephin and azithromycin-procalcitonin is still low   Acute on chronic hypoxic respiratory failure:On 3L of  of oxygen per minute at home.  wean oxygen to baseline as able; goal pulse ox is 85-88%    Peripheral neuropathy: Continue Neurontin   GERD: Continue PPI therapy   History of essential tremor: On primidone   Hyperlipidemia: Continue statin   Pancytopenia: Current hemoglobin stable in the range of 9-10.  Patient also has mild thrombocytopenia.  The numbers are better than her baseline.  She has history of pancytopenia thought to be secondary to myelosuppression from niraparib.  Follows with hematology/oncology   History of high-grade serous ovarian carcinoma: Follows with [name].was on niraparib   Hypertension: Continue current medications.  Monitor blood pressure   Weakness: PT evaluation with no home needs noted.     DVT prophylaxis:enoxaparin (LOVENOX) injection 40 mg Start: 10/29/22 1000       Code Status: Full Code   Family Communication:   Patient status:Inpatient   Patient is from :Home   Anticipated discharge WJ:XBJY   Estimated DC date:1-2 days     Consultants: Pulmonology   Procedures:None   Antimicrobials:  Anti-infectives (From admission, onward)    Start     Dose/Rate Route Frequency Ordered Stop   10/29/22 2300  cefTRIAXone (ROCEPHIN) 2 g in sodium chloride 0.9 % 100 mL IVPB  Status:  Discontinued        2 g 200 mL/hr over 30 Minutes Intravenous Every 24 hours 10/29/22 0048 10/30/22 0723   10/29/22 0100  azithromycin (ZITHROMAX) 500 mg in sodium chloride 0.9 % 250 mL IVPB  Status:  Discontinued         500 mg 250 mL/hr over 60 Minutes Intravenous Every 24 hours 10/29/22 0048 10/29/22 0102   10/28/22 2330  azithromycin (ZITHROMAX) 500 mg in sodium chloride 0.9 % 250 mL IVPB  Status:  Discontinued        500 mg 250 mL/hr over 60 Minutes Intravenous Every 24 hours 10/28/22 2323 10/30/22 0723   10/28/22 2315  cefTRIAXone (ROCEPHIN) 1 g in sodium chloride 0.9 % 100 mL IVPB        1 g 200 mL/hr over 30 Minutes Intravenous  Once 10/28/22 2302 10/29/22 0041   10/28/22 2315  azithromycin (ZITHROMAX) 250 mg in dextrose 5 % 125 mL IVPB  Status:  Discontinued        250 mg 127.5 mL/hr over 60 Minutes Intravenous Every 24 hours 10/28/22 2302 10/28/22 2323       Subjective: Continues to have a difficult time breathing; speaking in short sentences;   Objective: Vitals:   11/05/22 2108 11/06/22 0212 11/06/22 0621 11/06/22 0714  BP: 130/75 112/69 (!) 122/90   Pulse: (!) 103 81 78 89  Resp: '20 19 19 16  '$ Temp: 98.1 F (36.7 C) 97.7 F (36.5 C) 97.8 F (36.6 C)   TempSrc: Oral Oral Oral   SpO2: 94% 96% 93% 97%  Weight:      Height:        Intake/Output Summary (Last 24 hours) at 11/06/2022 1200 Last data filed at 11/05/2022 1950 Gross per 24 hour  Intake 760 ml  Output --  Net 760 ml   Filed Weights   10/28/22 1616  Weight: 51.7 kg    Examination:  General exam: Appears calm and comfortable  Respiratory system: poor air movement diffuse exp wheeze (fine). Cardiovascular system: normal S1 & S2 heard.  Gastrointestinal system: Abdomen is soft, nondistended, NT, no HSM;  Central nervous system: Alert and awake Extremities: No edema Skin: No significant lesions noted Psychiatry: normal affect.  Data Reviewed: I have personally reviewed following labs and imaging studies  CBC: Recent Labs  Lab 10/31/22 0340 11/01/22 0344 11/02/22 0726 11/04/22 0411 11/06/22 0455  WBC 9.1 8.9 8.2 6.5 7.8  HGB 7.6* 7.6* 8.0* 7.9* 7.5*  HCT 24.5* 24.1* 24.7* 25.6* 24.7*  MCV 102.5*  100.8* 101.6* 103.2* 104.7*  PLT 161 176 190 202 932   Basic Metabolic Panel: Recent Labs  Lab 10/31/22 0340 11/02/22 0726 11/04/22 0411 11/06/22 0455  NA 136 139 140 141  K 4.1 3.9 3.6 4.0  CL 93* 92* 91* 97*  CO2 35* 36* 40* 37*  GLUCOSE 156* 126* 162* 145*  BUN '10 14 13 14  '$ CREATININE 0.48 0.42* 0.53 0.45  CALCIUM 8.5* 8.4* 8.3* 8.0*  MG 1.9 2.1 1.9 1.9   GFR: Estimated Creatinine Clearance: 51 mL/min (by C-G formula based on SCr of 0.45 mg/dL). Liver Function Tests: No results for input(s): "AST", "ALT", "ALKPHOS", "BILITOT", "PROT", "ALBUMIN" in the last 168 hours. No results for input(s): "LIPASE", "AMYLASE" in the last 168 hours. No results for input(s): "AMMONIA" in the last 168 hours. Coagulation Profile: No results for input(s): "INR", "PROTIME" in the last 168 hours. Cardiac Enzymes: No results for input(s): "CKTOTAL", "CKMB", "CKMBINDEX", "TROPONINI" in the last  168 hours. BNP (last 3 results) No results for input(s): "PROBNP" in the last 8760 hours. HbA1C: No results for input(s): "HGBA1C" in the last 72 hours. CBG: No results for input(s): "GLUCAP" in the last 168 hours. Lipid Profile: No results for input(s): "CHOL", "HDL", "LDLCALC", "TRIG", "CHOLHDL", "LDLDIRECT" in the last 72 hours. Thyroid Function Tests: No results for input(s): "TSH", "T4TOTAL", "FREET4", "T3FREE", "THYROIDAB" in the last 72 hours. Anemia Panel: No results for input(s): "VITAMINB12", "FOLATE", "FERRITIN", "TIBC", "IRON", "RETICCTPCT" in the last 72 hours. Sepsis Labs: Recent Labs  Lab 11/01/22 1048  PROCALCITON 0.10    Recent Results (from the past 240 hour(s))  Culture, blood (Routine X 2) w Reflex to ID Panel     Status: None   Collection Time: 10/28/22  5:09 PM   Specimen: Left Antecubital  Result Value Ref Range Status   Specimen Description LEFT ANTECUBITAL  Final   Special Requests   Final    BOTTLES DRAWN AEROBIC AND ANAEROBIC Blood Culture adequate volume    Culture   Final    NO GROWTH 5 DAYS Performed at [facility], [address] [place], [place] [address]    Report Status 11/02/2022 FINAL  Final  Culture, blood (routine x 2)     Status: None   Collection Time: 10/28/22  5:11 PM   Specimen: Right Antecubital; Blood  Result Value Ref Range Status   Specimen Description RIGHT ANTECUBITAL  Final   Special Requests   Final    BOTTLES DRAWN AEROBIC AND ANAEROBIC Blood Culture results may not be optimal due to an excessive volume of blood received in culture bottles   Culture   Final    NO GROWTH 5 DAYS Performed at [facility], [address], [place], [place] [address]    Report Status 11/02/2022 FINAL  Final  Resp panel by RT-PCR (RSV, Flu A&B, Covid) Anterior Nasal Swab     Status: Abnormal   Collection Time: 10/28/22 10:02 PM   Specimen: Anterior Nasal Swab  Result Value Ref Range Status   SARS Coronavirus 2 by RT PCR NEGATIVE NEGATIVE Final    Comment: (NOTE) SARS-CoV-2 target nucleic acids are NOT DETECTED.  The SARS-CoV-2 RNA is generally detectable in upper respiratory specimens during the acute phase of infection. The lowest concentration of SARS-CoV-2 viral copies this assay can detect is 138 copies/mL. A negative result does not preclude SARS-Cov-2 infection and should not be used as the sole basis for treatment or other patient management decisions. A negative result may occur with  improper specimen collection/handling, submission of specimen other than nasopharyngeal swab, presence of viral mutation(s) within the areas targeted by this assay, and inadequate number of viral copies(<138 copies/mL). A negative result must be combined with clinical observations, patient history, and epidemiological information. The expected result is Negative.  Fact Sheet for Patients:  EntrepreneurPulse.com.au  Fact Sheet for Healthcare Providers:  IncredibleEmployment.be  This test is no t yet  approved or cleared by the [place] FDA and  has been authorized for detection and/or diagnosis of SARS-CoV-2 by FDA under an Emergency Use Authorization (EUA). This EUA will remain  in effect (meaning this test can be used) for the duration of the COVID-19 declaration under Section 564(b)(1) of the Act, 21 U.S.C.section 360bbb-3(b)(1), unless the authorization is terminated  or revoked sooner.       Influenza A by PCR NEGATIVE NEGATIVE Final   Influenza B by PCR NEGATIVE NEGATIVE Final    Comment: (NOTE) The Xpert Xpress  SARS-CoV-2/FLU/RSV plus assay is intended as an aid in the diagnosis of influenza from Nasopharyngeal swab specimens and should not be used as a sole basis for treatment. Nasal washings and aspirates are unacceptable for Xpert Xpress SARS-CoV-2/FLU/RSV testing.  Fact Sheet for Patients: EntrepreneurPulse.com.au  Fact Sheet for Healthcare Providers: IncredibleEmployment.be  This test is not yet approved or cleared by the [place] FDA and has been authorized for detection and/or diagnosis of SARS-CoV-2 by FDA under an Emergency Use Authorization (EUA). This EUA will remain in effect (meaning this test can be used) for the duration of the COVID-19 declaration under Section 564(b)(1) of the Act, 21 U.S.C. section 360bbb-3(b)(1), unless the authorization is terminated or revoked.     Resp Syncytial Virus by PCR POSITIVE (A) NEGATIVE Final    Comment: (NOTE) Fact Sheet for Patients: EntrepreneurPulse.com.au  Fact Sheet for Healthcare Providers: IncredibleEmployment.be  This test is not yet approved or cleared by the [place] FDA and has been authorized for detection and/or diagnosis of SARS-CoV-2 by FDA under an Emergency Use Authorization (EUA). This EUA will remain in effect (meaning this test can be used) for the duration of the COVID-19 declaration under Section 564(b)(1)  of the Act, 21 U.S.C. section 360bbb-3(b)(1), unless the authorization is terminated or revoked.  Performed at [facility], [address], [place], [place] [address]      Radiology Studies: No results found.   Scheduled Meds:  amLODipine  5 mg Oral QHS   arformoterol  15 mcg Nebulization BID   aspirin EC  81 mg Oral Q breakfast   atorvastatin  40 mg Oral Daily   benzonatate  100 mg Oral TID   budesonide (PULMICORT) nebulizer solution  0.25 mg Nebulization BID   Chlorhexidine Gluconate Cloth  6 each Topical Q0600   dextromethorphan-guaiFENesin  2 tablet Oral BID   enoxaparin (LOVENOX) injection  40 mg Subcutaneous Q24H   ferrous sulfate  325 mg Oral QODAY   gabapentin  600 mg Oral TID   levalbuterol  0.63 mg Nebulization TID   lidocaine  1 patch Transdermal Q24H   magnesium oxide  400 mg Oral BID   methylPREDNISolone (SOLU-MEDROL) injection  80 mg Intravenous Q12H   pantoprazole  40 mg Oral BID AC   primidone  50 mg Oral QHS   revefenacin  175 mcg Nebulization Daily    LOS: 9 days   Time spent: 35 minutes  Allysa Governale,MD [facility]  If 7PM-7AM, please contact night-coverage [email or web address] 11/06/2022, 12:00 PM

## 2022-11-07 DIAGNOSIS — J441 Chronic obstructive pulmonary disease with (acute) exacerbation: Secondary | ICD-10-CM | POA: Diagnosis not present

## 2022-11-07 DIAGNOSIS — J121 Respiratory syncytial virus pneumonia: Secondary | ICD-10-CM | POA: Diagnosis not present

## 2022-11-07 DIAGNOSIS — J9621 Acute and chronic respiratory failure with hypoxia: Secondary | ICD-10-CM | POA: Diagnosis not present

## 2022-11-07 DIAGNOSIS — I10 Essential (primary) hypertension: Secondary | ICD-10-CM | POA: Diagnosis not present

## 2022-11-07 NOTE — Progress Notes (Signed)
PROGRESS NOTE  [name]  KAJ:[id number] DOB: [birth year] DOA: 10/28/2022 PCP: [name], MD   Brief Narrative:    Patient is a 66 year old female with history of anemia, asthma, carotid stenosis, COPD on 3 L 02 at home, hypertension, hyperlipidemia, peripheral neuropathy, peripheral vascular disease who presented to the emergency department with acute onset of dyspnea, greenish sputum, wheezing, fever, chills.  On presentation she was tachycardic.  Found to be RSV positive.  Chest x-ray showed features of COPD, bronchiectasis, increased interstitial markings in the left lower lung field suggesting scarring/interstitial pneumonia.  Patient was admitted for the management of acute COPD exacerbation secondary to RSV and possible pneumonia.   She is showing slow signs of improvement and pulmonology is following.  Anticipate discharge in the next 24-48 hours once Solu-Medrol can be weaned.  Assessment & Plan:   Principal Problem:   COPD exacerbation ([place]) Active Problems:   Essential hypertension   Acute on chronic respiratory failure with hypoxia and hypercapnia (HCC)   Sepsis due to pneumonia (HCC)   Dyslipidemia   Essential tremor   GERD without esophagitis   Peripheral neuropathy   RSV (respiratory syncytial virus pneumonia)  Assessment and Plan:   Acute COPD exacerbation: Presented with wheezing, shortness of breath.  On 3 L of oxygen at home.  Currently on 3 L.  She is a past smoker Continue bronchodilators, steroid therapy, mucolytic's.  Having right chest wall pain will added Lidoderm patch. -Continues to have persistent dyspnea requiring higher dose of IV steroids and close monitoring.  Chest x-ray 12/15 with no significant changes -Discussed case with pulmonology 12/15 with no need to add any other significant measures at this point, continue high-dose IV steroid -Added Brovana 12/16 -Appears to be slowly improving -Pulmonology has seen on 12/18 with addition of  Yupelri -Continue aggressive IV steroids for now until further improvement and wean and likely can be discharged in 1-2 days -Appears to be end-stage COPD and will require outpatient palliative consultation for further discussion of goals of care   RSV infection: No need for antibiotics as procalcitonin is low and therefore will discontinue Rocephin and azithromycin-procalcitonin is still low   Acute on chronic hypoxic respiratory failure:On 3L of  of oxygen per minute at home.  wean oxygen to baseline as able; goal pulse ox is 85-88%.  Anticipating DC home tomorrow.     Peripheral neuropathy: Continue Neurontin   GERD: Continue PPI therapy   History of essential tremor: On primidone   Hyperlipidemia: Continue statin   Pancytopenia: Current hemoglobin stable in the range of 9-10.  Patient also has mild thrombocytopenia.  The numbers are better than her baseline.  She has history of pancytopenia thought to be secondary to myelosuppression from niraparib.  Follows with hematology/oncology   History of high-grade serous ovarian carcinoma: Follows with [name]   Hypertension: Continue current medications.  Monitor blood pressure   Weakness: PT evaluation with no home needs noted.     DVT prophylaxis:enoxaparin (LOVENOX) injection 40 mg Start: 10/29/22 1000       Code Status: Full Code   Family Communication:   Patient status:Inpatient   Patient is from :Home   Anticipated discharge MB:TDHR   Estimated DC date: 11/08/22     Consultants: Pulmonology   Procedures:None   Antimicrobials:  Anti-infectives (From admission, onward)    Start     Dose/Rate Route Frequency Ordered Stop   10/29/22 2300  cefTRIAXone (ROCEPHIN) 2 g in sodium chloride 0.9 % 100  mL IVPB  Status:  Discontinued        2 g 200 mL/hr over 30 Minutes Intravenous Every 24 hours 10/29/22 0048 10/30/22 0723   10/29/22 0100  azithromycin (ZITHROMAX) 500 mg in sodium chloride 0.9 % 250 mL IVPB  Status:   Discontinued        500 mg 250 mL/hr over 60 Minutes Intravenous Every 24 hours 10/29/22 0048 10/29/22 0102   10/28/22 2330  azithromycin (ZITHROMAX) 500 mg in sodium chloride 0.9 % 250 mL IVPB  Status:  Discontinued        500 mg 250 mL/hr over 60 Minutes Intravenous Every 24 hours 10/28/22 2323 10/30/22 0723   10/28/22 2315  cefTRIAXone (ROCEPHIN) 1 g in sodium chloride 0.9 % 100 mL IVPB        1 g 200 mL/hr over 30 Minutes Intravenous  Once 10/28/22 2302 10/29/22 0041   10/28/22 2315  azithromycin (ZITHROMAX) 250 mg in dextrose 5 % 125 mL IVPB  Status:  Discontinued        250 mg 127.5 mL/hr over 60 Minutes Intravenous Every 24 hours 10/28/22 2302 10/28/22 2323       Subjective: Not yet at baseline, continues to have dyspnea (severe), should be ready for home tomorrow.   Objective: Vitals:   11/07/22 0505 11/07/22 0834 11/07/22 1325 11/07/22 1429  BP: 127/74  (!) 142/69   Pulse: 78  (!) 101   Resp: (!) 22  20   Temp: 98.3 F (36.8 C)  97.8 F (36.6 C)   TempSrc:   Oral   SpO2: 94% 90% 91% 92%  Weight:      Height:        Intake/Output Summary (Last 24 hours) at 11/07/2022 1643 Last data filed at 11/07/2022 1300 Gross per 24 hour  Intake 480 ml  Output --  Net 480 ml   Filed Weights   10/28/22 1616  Weight: 51.7 kg    Examination:  General exam: Appears calm and comfortable  Respiratory system: poor air movement diffuse exp wheeze (fine). Cardiovascular system: normal S1 & S2 heard.  Gastrointestinal system: Abdomen is soft, nondistended, NT, no HSM;  Central nervous system: Alert and awake Extremities: No edema Skin: No significant lesions noted Psychiatry: normal affect.  Data Reviewed: I have personally reviewed following labs and imaging studies  CBC: Recent Labs  Lab 11/01/22 0344 11/02/22 0726 11/04/22 0411 11/06/22 0455  WBC 8.9 8.2 6.5 7.8  HGB 7.6* 8.0* 7.9* 7.5*  HCT 24.1* 24.7* 25.6* 24.7*  MCV 100.8* 101.6* 103.2* 104.7*  PLT 176 190  202 497   Basic Metabolic Panel: Recent Labs  Lab 11/02/22 0726 11/04/22 0411 11/06/22 0455  NA 139 140 141  K 3.9 3.6 4.0  CL 92* 91* 97*  CO2 36* 40* 37*  GLUCOSE 126* 162* 145*  BUN '14 13 14  '$ CREATININE 0.42* 0.53 0.45  CALCIUM 8.4* 8.3* 8.0*  MG 2.1 1.9 1.9   GFR: Estimated Creatinine Clearance: 51 mL/min (by C-G formula based on SCr of 0.45 mg/dL). Liver Function Tests: No results for input(s): "AST", "ALT", "ALKPHOS", "BILITOT", "PROT", "ALBUMIN" in the last 168 hours. No results for input(s): "LIPASE", "AMYLASE" in the last 168 hours. No results for input(s): "AMMONIA" in the last 168 hours. Coagulation Profile: No results for input(s): "INR", "PROTIME" in the last 168 hours. Cardiac Enzymes: No results for input(s): "CKTOTAL", "CKMB", "CKMBINDEX", "TROPONINI" in the last 168 hours. BNP (last 3 results) No results for input(s): "PROBNP" in the  last 8760 hours. HbA1C: No results for input(s): "HGBA1C" in the last 72 hours. CBG: No results for input(s): "GLUCAP" in the last 168 hours. Lipid Profile: No results for input(s): "CHOL", "HDL", "LDLCALC", "TRIG", "CHOLHDL", "LDLDIRECT" in the last 72 hours. Thyroid Function Tests: No results for input(s): "TSH", "T4TOTAL", "FREET4", "T3FREE", "THYROIDAB" in the last 72 hours. Anemia Panel: No results for input(s): "VITAMINB12", "FOLATE", "FERRITIN", "TIBC", "IRON", "RETICCTPCT" in the last 72 hours. Sepsis Labs: Recent Labs  Lab 11/01/22 1048  PROCALCITON 0.10    Recent Results (from the past 240 hour(s))  Culture, blood (Routine X 2) w Reflex to ID Panel     Status: None   Collection Time: 10/28/22  5:09 PM   Specimen: Left Antecubital  Result Value Ref Range Status   Specimen Description LEFT ANTECUBITAL  Final   Special Requests   Final    BOTTLES DRAWN AEROBIC AND ANAEROBIC Blood Culture adequate volume   Culture   Final    NO GROWTH 5 DAYS Performed at [facility], [address], [place], [place]  [address]    Report Status 11/02/2022 FINAL  Final  Culture, blood (routine x 2)     Status: None   Collection Time: 10/28/22  5:11 PM   Specimen: Right Antecubital; Blood  Result Value Ref Range Status   Specimen Description RIGHT ANTECUBITAL  Final   Special Requests   Final    BOTTLES DRAWN AEROBIC AND ANAEROBIC Blood Culture results may not be optimal due to an excessive volume of blood received in culture bottles   Culture   Final    NO GROWTH 5 DAYS Performed at [facility], [address] [place], [place] [address]    Report Status 11/02/2022 FINAL  Final  Resp panel by RT-PCR (RSV, Flu A&B, Covid) Anterior Nasal Swab     Status: Abnormal   Collection Time: 10/28/22 10:02 PM   Specimen: Anterior Nasal Swab  Result Value Ref Range Status   SARS Coronavirus 2 by RT PCR NEGATIVE NEGATIVE Final    Comment: (NOTE) SARS-CoV-2 target nucleic acids are NOT DETECTED.  The SARS-CoV-2 RNA is generally detectable in upper respiratory specimens during the acute phase of infection. The lowest concentration of SARS-CoV-2 viral copies this assay can detect is 138 copies/mL. A negative result does not preclude SARS-Cov-2 infection and should not be used as the sole basis for treatment or other patient management decisions. A negative result may occur with  improper specimen collection/handling, submission of specimen other than nasopharyngeal swab, presence of viral mutation(s) within the areas targeted by this assay, and inadequate number of viral copies(<138 copies/mL). A negative result must be combined with clinical observations, patient history, and epidemiological information. The expected result is Negative.  Fact Sheet for Patients:  EntrepreneurPulse.com.au  Fact Sheet for Healthcare Providers:  IncredibleEmployment.be  This test is no t yet approved or cleared by the [place] FDA and  has been authorized for detection and/or diagnosis of  SARS-CoV-2 by FDA under an Emergency Use Authorization (EUA). This EUA will remain  in effect (meaning this test can be used) for the duration of the COVID-19 declaration under Section 564(b)(1) of the Act, 21 U.S.C.section 360bbb-3(b)(1), unless the authorization is terminated  or revoked sooner.       Influenza A by PCR NEGATIVE NEGATIVE Final   Influenza B by PCR NEGATIVE NEGATIVE Final    Comment: (NOTE) The Xpert Xpress SARS-CoV-2/FLU/RSV plus assay is intended as an aid in the diagnosis of influenza  from Nasopharyngeal swab specimens and should not be used as a sole basis for treatment. Nasal washings and aspirates are unacceptable for Xpert Xpress SARS-CoV-2/FLU/RSV testing.  Fact Sheet for Patients: EntrepreneurPulse.com.au  Fact Sheet for Healthcare Providers: IncredibleEmployment.be  This test is not yet approved or cleared by the [place] FDA and has been authorized for detection and/or diagnosis of SARS-CoV-2 by FDA under an Emergency Use Authorization (EUA). This EUA will remain in effect (meaning this test can be used) for the duration of the COVID-19 declaration under Section 564(b)(1) of the Act, 21 U.S.C. section 360bbb-3(b)(1), unless the authorization is terminated or revoked.     Resp Syncytial Virus by PCR POSITIVE (A) NEGATIVE Final    Comment: (NOTE) Fact Sheet for Patients: EntrepreneurPulse.com.au  Fact Sheet for Healthcare Providers: IncredibleEmployment.be  This test is not yet approved or cleared by the [place] FDA and has been authorized for detection and/or diagnosis of SARS-CoV-2 by FDA under an Emergency Use Authorization (EUA). This EUA will remain in effect (meaning this test can be used) for the duration of the COVID-19 declaration under Section 564(b)(1) of the Act, 21 U.S.C. section 360bbb-3(b)(1), unless the authorization is terminated  or revoked.  Performed at [facility], [address] [place], [place] [address]      Radiology Studies: No results found.   Scheduled Meds:  amLODipine  5 mg Oral QHS   arformoterol  15 mcg Nebulization BID   aspirin EC  81 mg Oral Q breakfast   atorvastatin  40 mg Oral Daily   benzonatate  100 mg Oral TID   budesonide (PULMICORT) nebulizer solution  0.25 mg Nebulization BID   Chlorhexidine Gluconate Cloth  6 each Topical Q0600   dextromethorphan-guaiFENesin  2 tablet Oral BID   enoxaparin (LOVENOX) injection  40 mg Subcutaneous Q24H   ferrous sulfate  325 mg Oral QODAY   gabapentin  600 mg Oral TID   levalbuterol  0.63 mg Nebulization TID   lidocaine  1 patch Transdermal Q24H   magnesium oxide  400 mg Oral BID   methylPREDNISolone (SOLU-MEDROL) injection  80 mg Intravenous Q12H   pantoprazole  40 mg Oral BID AC   primidone  50 mg Oral QHS   revefenacin  175 mcg Nebulization Daily    LOS: 10 days   Time spent: 35 minutes  Vicki Kea,MD [facility]  If 7PM-7AM, please contact night-coverage [email or web address] 11/07/2022, 4:43 PM

## 2022-11-08 DIAGNOSIS — J9621 Acute and chronic respiratory failure with hypoxia: Secondary | ICD-10-CM | POA: Diagnosis not present

## 2022-11-08 DIAGNOSIS — J121 Respiratory syncytial virus pneumonia: Secondary | ICD-10-CM | POA: Diagnosis not present

## 2022-11-08 DIAGNOSIS — J441 Chronic obstructive pulmonary disease with (acute) exacerbation: Secondary | ICD-10-CM | POA: Diagnosis not present

## 2022-11-08 DIAGNOSIS — J9622 Acute and chronic respiratory failure with hypercapnia: Secondary | ICD-10-CM | POA: Diagnosis not present

## 2022-11-08 MED ORDER — PREDNISONE 20 MG PO TABS: ORAL_TABLET | ORAL | 0 refills | Status: DC

## 2022-11-08 MED ORDER — HEPARIN SOD (PORK) LOCK FLUSH 100 UNIT/ML IV SOLN
500.0000 [IU] | Freq: Once | INTRAVENOUS | Status: AC
  Administered 2022-11-08: 500 [IU] via INTRAVENOUS
  Filled 2022-11-08: qty 5

## 2022-11-08 MED ORDER — LEVALBUTEROL HCL 1.25 MG/3ML IN NEBU: 1.2500 mg | INHALATION_SOLUTION | Freq: Three times a day (TID) | RESPIRATORY_TRACT | 1 refills | Status: DC

## 2022-11-08 NOTE — Discharge Instructions (Signed)
IMPORTANT INFORMATION: PAY CLOSE ATTENTION   PHYSICIAN DISCHARGE INSTRUCTIONS  Follow with Primary care provider  [name], MD  and other consultants as instructed by your Hospitalist Physician  [place] IF SYMPTOMS COME BACK, WORSEN OR NEW PROBLEM DEVELOPS   Please note: You were cared for by a hospitalist during your hospital stay. Every effort will be made to forward records to your primary care provider.  You can request that your primary care provider send for your hospital records if they have not received them.  Once you are discharged, your primary care physician will handle any further medical issues. Please note that NO REFILLS for any discharge medications will be authorized once you are discharged, as it is imperative that you return to your primary care physician (or establish a relationship with a primary care physician if you do not have one) for your post hospital discharge needs so that they can reassess your need for medications and monitor your lab values.  Please get a complete blood count and chemistry panel checked by your Primary MD at your next visit, and again as instructed by your Primary MD.  Get Medicines reviewed and adjusted: Please take all your medications with you for your next visit with your Primary MD  Laboratory/radiological data: Please request your Primary MD to go over all hospital tests and procedure/radiological results at the follow up, please ask your primary care provider to get all Hospital records sent to his/her office.  In some cases, they will be blood work, cultures and biopsy results pending at the time of your discharge. Please request that your primary care provider follow up on these results.  If you are diabetic, please bring your blood sugar readings with you to your follow up appointment with primary care.    Please call and make your follow up appointments as soon as possible.    Also Note  the following: If you experience worsening of your admission symptoms, develop shortness of breath, life threatening emergency, suicidal or homicidal thoughts you must seek medical attention immediately by calling 911 or calling your MD immediately  if symptoms less severe.  You must read complete instructions/literature along with all the possible adverse reactions/side effects for all the Medicines you take and that have been prescribed to you. Take any new Medicines after you have completely understood and accpet all the possible adverse reactions/side effects.   Do not drive when taking Pain medications or sleeping medications (Benzodiazepines)  Do not take more than prescribed Pain, Sleep and Anxiety Medications. It is not advisable to combine anxiety,sleep and pain medications without talking with your primary care practitioner  Special Instructions: If you have smoked or chewed Tobacco  in the last 2 yrs please stop smoking, stop any regular Alcohol  and or any Recreational drug use.  Wear Seat belts while driving.  Do not drive if taking any narcotic, mind altering or controlled substances or recreational drugs or alcohol.

## 2022-11-08 NOTE — Discharge Summary (Signed)
Physician Discharge Summary  [name] ZOX:[id number] DOB: [birth year] DOA: 10/28/2022  PCP: [name], MD Pulmonary: [name] Heme/Onc: [name]   Admit date: 10/28/2022 Discharge date: 11/08/2022  Admitted From:  Home  Disposition:  Home   Recommendations for Outpatient Follow-up:  Follow up with PCP in 1 weeks Follow up with pulmonary in 1-2 weeks Follow up with hem/onc as scheduled Please consider outpatient palliative medicine referral   Discharge Condition: Stable on home 3L/min [place]   CODE STATUS: Full  DIET: resume prior home diet   Brief Hospitalization Summary: Please see all hospital notes, images, labs for full details of the hospitalization. ADMISSION HPI:   66 year old female with history of anemia, asthma, carotid stenosis, COPD on 3 L 02 at home, hypertension, hyperlipidemia, peripheral neuropathy, peripheral vascular disease who presented to the emergency department with acute onset of dyspnea, greenish sputum, wheezing, fever, chills.  On presentation she was tachycardic.  Found to be RSV positive.  Chest x-ray showed features of COPD, bronchiectasis, increased interstitial markings in the left lower lung field suggesting scarring/interstitial pneumonia.  Patient was admitted for the management of acute COPD exacerbation secondary to RSV and possible pneumonia.   She is showing slow signs of improvement and pulmonology is following.  Anticipate discharge in the next 24-48 hours once Solu-Medrol can be weaned.   HOSPITAL COURSE BY PROBLEM   Acute COPD exacerbation: Presented with wheezing, shortness of breath.  On 3 L of oxygen at home.  Currently on 3 L.  She is a past smoker Continue bronchodilators, steroid therapy, mucolytic's.  Having right chest wall pain will added Lidoderm patch. -Continues to have persistent dyspnea requiring higher dose of IV steroids and close monitoring.  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  weaned oxygen to baseline as able; goal pulse ox is 85-88%. Pt feels like she is back to her baseline.  DC home today.       Peripheral neuropathy: Continue Neurontin   GERD: Continue PPI therapy   History of essential tremor: On primidone   Hyperlipidemia: Continue statin   Pancytopenia: Current hemoglobin stable in the range of 9-10.  Patient also has mild thrombocytopenia.  The numbers are better than her baseline.  She has history of pancytopenia thought to be secondary to myelosuppression from niraparib.  Follows with hematology/oncology   History of high-grade serous ovarian carcinoma: Follows with [name]   Hypertension: Continue current medications.  Monitor blood pressure   Weakness: PT evaluation with no home needs noted.    Discharge Diagnoses:  Principal Problem:   COPD exacerbation ([place]) Active Problems:   Essential hypertension   Acute on chronic respiratory failure with hypoxia and hypercapnia (HCC)   Sepsis due to pneumonia (HCC)   Dyslipidemia   Essential tremor   GERD without esophagitis   Peripheral neuropathy  RSV (respiratory syncytial virus pneumonia)   Discharge Instructions: Discharge Instructions     Ambulatory referral to Hematology / Oncology   Complete by: As  directed    Hospital follow up   Ambulatory referral to Pulmonology   Complete by: As directed    Hospital follow up   Reason for referral: Asthma/COPD      Allergies as of 11/08/2022   No Known Allergies      Medication List     STOP taking these medications    moxifloxacin 400 MG tablet Commonly known as: AVELOX   nystatin 100000 UNIT/ML suspension Commonly known as: MYCOSTATIN   primidone 50 MG tablet Commonly known as: MYSOLINE       TAKE these medications    albuterol 108 (90 Base) MCG/ACT inhaler Commonly known as: VENTOLIN HFA Inhale 2 puffs into the lungs every 6 (six) hours as needed for wheezing or shortness of breath.   amLODipine 5 MG tablet Commonly known as: NORVASC Take 5 mg by mouth at bedtime.   aspirin EC 81 MG tablet Take 1 tablet (81 mg total) by mouth daily with breakfast.   atorvastatin 40 MG tablet Commonly known as: LIPITOR Take 1 tablet (40 mg total) by mouth daily.   Breztri Aerosphere 160-9-4.8 MCG/ACT Aero Generic drug: Budeson-Glycopyrrol-Formoterol Inhale 2 puffs into the lungs 2 (two) times daily.   ferrous sulfate 325 (65 FE) MG tablet Take 325 mg by mouth every other day.   gabapentin 600 MG tablet Commonly known as: NEURONTIN Take 1 tablet by mouth 3 (three) times daily.   ipratropium 17 MCG/ACT inhaler Commonly known as: ATROVENT HFA Inhale 2 puffs into the lungs every 6 (six) hours as needed for wheezing.   levalbuterol 1.25 MG/3ML nebulizer solution Commonly known as: XOPENEX Take 1.25 mg by nebulization in the morning, at noon, and at bedtime. What changed:  when to take this reasons to take this   magnesium oxide 400 (240 Mg) MG tablet Commonly known as: MAG-OX Take 1 tablet (400 mg total) by mouth 2 (two) times daily.   nortriptyline 25 MG capsule Commonly known as: PAMELOR Take 25 mg by mouth at bedtime.   OXYGEN Inhale 3 L into the lungs continuous.   pantoprazole 40 MG tablet Commonly known  as: Protonix Take 1 tablet (40 mg total) by mouth 2 (two) times daily.   predniSONE 20 MG tablet Commonly known as: DELTASONE Take 3 PO QAM x5days, 2 PO QAM x5days, 1 PO QAM x5days, then return to 5 mg daily Start taking on: November 09, 2022 What changed:  medication strength how much to take how to take this when to take this additional instructions        Follow-up Information     Adamo, Hattie Perch, MD. Schedule an appointment as soon as possible for a visit in 1 week(s).   Specialty: Family Medicine Why: Hospital Follow Up Contact information: [place] [address] [phone]         Derek Jack, MD. Schedule an appointment as soon as possible for a visit in 2 week(s).   Specialty: Hematology Why: Hospital Follow Up Contact information: [address] [place] [address] [phone]         Tanda Rockers, MD. Schedule an appointment as soon as possible for a visit in 1 week(s).   Specialty: Pulmonary Disease Why: Hospital Follow Up Contact information: [place] [address] [phone]  No Known Allergies Allergies as of 11/08/2022   No Known Allergies      Medication List     STOP taking these medications    moxifloxacin 400 MG tablet Commonly known as: AVELOX   nystatin 100000 UNIT/ML suspension Commonly known as: MYCOSTATIN   primidone 50 MG tablet Commonly known as: MYSOLINE       TAKE these medications    albuterol 108 (90 Base) MCG/ACT inhaler Commonly known as: VENTOLIN HFA Inhale 2 puffs into the lungs every 6 (six) hours as needed for wheezing or shortness of breath.   amLODipine 5 MG tablet Commonly known as: NORVASC Take 5 mg by mouth at bedtime.   aspirin EC 81 MG tablet Take 1 tablet (81 mg total) by mouth daily with breakfast.   atorvastatin 40 MG tablet Commonly known as: LIPITOR Take 1 tablet (40 mg total) by mouth daily.   Breztri  Aerosphere 160-9-4.8 MCG/ACT Aero Generic drug: Budeson-Glycopyrrol-Formoterol Inhale 2 puffs into the lungs 2 (two) times daily.   ferrous sulfate 325 (65 FE) MG tablet Take 325 mg by mouth every other day.   gabapentin 600 MG tablet Commonly known as: NEURONTIN Take 1 tablet by mouth 3 (three) times daily.   ipratropium 17 MCG/ACT inhaler Commonly known as: ATROVENT HFA Inhale 2 puffs into the lungs every 6 (six) hours as needed for wheezing.   levalbuterol 1.25 MG/3ML nebulizer solution Commonly known as: XOPENEX Take 1.25 mg by nebulization in the morning, at noon, and at bedtime. What changed:  when to take this reasons to take this   magnesium oxide 400 (240 Mg) MG tablet Commonly known as: MAG-OX Take 1 tablet (400 mg total) by mouth 2 (two) times daily.   nortriptyline 25 MG capsule Commonly known as: PAMELOR Take 25 mg by mouth at bedtime.   OXYGEN Inhale 3 L into the lungs continuous.   pantoprazole 40 MG tablet Commonly known as: Protonix Take 1 tablet (40 mg total) by mouth 2 (two) times daily.   predniSONE 20 MG tablet Commonly known as: DELTASONE Take 3 PO QAM x5days, 2 PO QAM x5days, 1 PO QAM x5days, then return to 5 mg daily Start taking on: November 09, 2022 What changed:  medication strength how much to take how to take this when to take this additional instructions        Procedures/Studies: DG CHEST PORT 1 VIEW  Result Date: 11/01/2022 CLINICAL DATA:  Dyspnea and respiratory abnormalities EXAM: PORTABLE CHEST 1 VIEW COMPARISON:  Radiograph 10/30/2022 FINDINGS: Chest port catheter tip overlies the superior cavoatrial junction. Unchanged cardiomediastinal silhouette. Pulmonary hyperinflation. Emphysema and bronchial wall thickening. There is no focal airspace consolidation. No large pleural effusion or evidence of pneumothorax. Bones are unchanged. IMPRESSION: Findings of COPD.  No new airspace disease. Electronically Signed   By: Maurine Simmering  M.D.   On: 11/01/2022 12:33   DG CHEST PORT 1 VIEW  Result Date: 10/30/2022 CLINICAL DATA:  Dyspnea EXAM: PORTABLE CHEST 1 VIEW COMPARISON:  Previous studies including the examination of 10/28/2022 FINDINGS: Cardiac size is within normal limits. Flattening of diaphragms suggests COPD. There are no signs of pulmonary edema or focal pulmonary consolidation. There is slight prominence of interstitial markings in both lungs. Focal increased markings are seen in the medial left lower lung field with no significant change. There is slight improvement in aeration in the lateral aspect of left lower lung field. There is no pleural effusion or pneumothorax. Tip of left subclavian chest port is seen  in superior vena cava. IMPRESSION: COPD. There is slight prominence of interstitial markings in both lungs suggesting possible chronic interstitial lung disease with scarring. There is focal increased markings in the medial left lower lung fields suggesting interstitial pneumonia or more pronounced scarring. There is no new focal pulmonary consolidation. There are no signs of alveolar pulmonary edema. Electronically Signed   By: Elmer Picker M.D.   On: 10/30/2022 17:03   DG Chest 2 View  Result Date: 10/28/2022 CLINICAL DATA:  Shortness of breath EXAM: CHEST - 2 VIEW COMPARISON:  Previous studies including the examination of 10/07/2022 FINDINGS: Cardiac size is within normal limits. Increase in AP diameter of chest suggests COPD. Increased interstitial markings are seen in left lower lung field. There is ectasia of bronchi in the upper lung fields. Emphysematous changes are noted in the upper lung fields. There is no significant pleural effusion or pneumothorax. There is left subclavian central venous catheter with its tip in superior vena cava. IMPRESSION: COPD. Bronchiectasis. Increased interstitial markings in the left lower lung fields may suggest scarring or interstitial pneumonia superimposed over fibrosis.  Electronically Signed   By: Elmer Picker M.D.   On: 10/28/2022 16:56     Subjective: Pt reports she feels at her baseline.  Agreeable to going home.    Discharge Exam: Vitals:   11/08/22 0436 11/08/22 1018  BP: 117/73   Pulse: 75   Resp: 20   Temp: 97.8 F (36.6 C)   SpO2: 98% 90%   Vitals:   11/07/22 2115 11/07/22 2232 11/08/22 0436 11/08/22 1018  BP:  (!) 154/69 117/73   Pulse:  97 75   Resp:  20 20   Temp:  98 F (36.7 C) 97.8 F (36.6 C)   TempSrc:  Oral Oral   SpO2: 90% 98% 98% 90%  Weight:      Height:       General: Pt is alert, awake, not in acute distress Cardiovascular: normal S1/S2 +, no rubs, no gallops Respiratory: no increased work of breathing  Abdominal: Soft, NT, ND, bowel sounds + Extremities: no edema, no cyanosis   The results of significant diagnostics from this hospitalization (including imaging, microbiology, ancillary and laboratory) are listed below for reference.     Microbiology: No results found for this or any previous visit (from the past 240 hour(s)).   Labs: BNP (last 3 results) Recent Labs    06/28/22 1203 07/31/22 1745 10/03/22 1817  BNP 27.0 29.0 67.1   Basic Metabolic Panel: Recent Labs  Lab 11/02/22 0726 11/04/22 0411 11/06/22 0455  NA 139 140 141  K 3.9 3.6 4.0  CL 92* 91* 97*  CO2 36* 40* 37*  GLUCOSE 126* 162* 145*  BUN '14 13 14  '$ CREATININE 0.42* 0.53 0.45  CALCIUM 8.4* 8.3* 8.0*  MG 2.1 1.9 1.9   Liver Function Tests: No results for input(s): "AST", "ALT", "ALKPHOS", "BILITOT", "PROT", "ALBUMIN" in the last 168 hours. No results for input(s): "LIPASE", "AMYLASE" in the last 168 hours. No results for input(s): "AMMONIA" in the last 168 hours. CBC: Recent Labs  Lab 11/02/22 0726 11/04/22 0411 11/06/22 0455  WBC 8.2 6.5 7.8  HGB 8.0* 7.9* 7.5*  HCT 24.7* 25.6* 24.7*  MCV 101.6* 103.2* 104.7*  PLT 190 202 182   Cardiac Enzymes: No results for input(s): "CKTOTAL", "CKMB", "CKMBINDEX",  "TROPONINI" in the last 168 hours. BNP: Invalid input(s): "POCBNP" CBG: No results for input(s): "GLUCAP" in the last 168 hours. D-Dimer No results for input(s): "  DDIMER" in the last 72 hours. Hgb A1c No results for input(s): "HGBA1C" in the last 72 hours. Lipid Profile No results for input(s): "CHOL", "HDL", "LDLCALC", "TRIG", "CHOLHDL", "LDLDIRECT" in the last 72 hours. Thyroid function studies No results for input(s): "TSH", "T4TOTAL", "T3FREE", "THYROIDAB" in the last 72 hours.  Invalid input(s): "FREET3" Anemia work up No results for input(s): "VITAMINB12", "FOLATE", "FERRITIN", "TIBC", "IRON", "RETICCTPCT" in the last 72 hours. Urinalysis    Component Value Date/Time   COLORURINE YELLOW 10/29/2022 0040   APPEARANCEUR CLEAR 10/29/2022 0040   APPEARANCEUR Clear 10/08/2013 2007   LABSPEC 1.012 10/29/2022 0040   LABSPEC 1.003 10/08/2013 2007   PHURINE 7.0 10/29/2022 0040   GLUCOSEU NEGATIVE 10/29/2022 0040   GLUCOSEU Negative 10/08/2013 2007   HGBUR NEGATIVE 10/29/2022 0040   BILIRUBINUR NEGATIVE 10/29/2022 0040   BILIRUBINUR Negative 10/08/2013 2007   KETONESUR NEGATIVE 10/29/2022 0040   PROTEINUR NEGATIVE 10/29/2022 0040   NITRITE NEGATIVE 10/29/2022 0040   LEUKOCYTESUR NEGATIVE 10/29/2022 0040   LEUKOCYTESUR Negative 10/08/2013 2007   Sepsis Labs Recent Labs  Lab 11/02/22 0726 11/04/22 0411 11/06/22 0455  WBC 8.2 6.5 7.8   Microbiology No results found for this or any previous visit (from the past 240 hour(s)).  Time coordinating discharge: 45 mins   SIGNED:  Irwin Brakeman, MD  [facility] 11/08/2022, 12:21 PM How to contact the [facility] Attending or Consulting provider [place] or covering provider during after hours [place], for this patient?  Check the care team in [facility] and look for a) attending/consulting [facility] provider listed and b) the [facility] team listed Log into [email or web address] and use [place]'s universal password to access. If you do not have the  password, please contact the hospital operator. Locate the [facility] provider you are looking for under [facility] and page to a number that you can be directly reached. If you still have difficulty reaching the provider, please page the [facility] (Director on Call) for the Hospitalists listed on amion for assistance.

## 2022-11-08 NOTE — Care Management Important Message (Signed)
Important Message  Patient Details  Name: [name] MRN: [id number] Date of Birth: 1956/07/18   Medicare Important Message Given:  Other (see comment) (uable to reach by phone, copy to be mailed to address on file)     Tommy Medal 11/08/2022, 9:41 AM

## 2022-11-09 ENCOUNTER — Emergency Department (HOSPITAL_COMMUNITY): Payer: Medicare HMO

## 2022-11-09 ENCOUNTER — Observation Stay (HOSPITAL_COMMUNITY)
Admission: EM | Admit: 2022-11-09 | Discharge: 2022-11-12 | Disposition: A | Discharge status: Home / Self Care | Payer: Medicare HMO | Attending: Internal Medicine | Admitting: Internal Medicine

## 2022-11-09 ENCOUNTER — Encounter (HOSPITAL_COMMUNITY): Payer: Self-pay

## 2022-11-09 DIAGNOSIS — R918 Other nonspecific abnormal finding of lung field: Secondary | ICD-10-CM

## 2022-11-09 DIAGNOSIS — Z8543 Personal history of malignant neoplasm of ovary: Secondary | ICD-10-CM | POA: Insufficient documentation

## 2022-11-09 DIAGNOSIS — C569 Malignant neoplasm of unspecified ovary: Secondary | ICD-10-CM

## 2022-11-09 DIAGNOSIS — R911 Solitary pulmonary nodule: Secondary | ICD-10-CM | POA: Insufficient documentation

## 2022-11-09 DIAGNOSIS — J441 Chronic obstructive pulmonary disease with (acute) exacerbation: Secondary | ICD-10-CM | POA: Diagnosis not present

## 2022-11-09 DIAGNOSIS — Z85028 Personal history of other malignant neoplasm of stomach: Secondary | ICD-10-CM | POA: Diagnosis not present

## 2022-11-09 DIAGNOSIS — R0602 Shortness of breath: Secondary | ICD-10-CM | POA: Diagnosis present

## 2022-11-09 DIAGNOSIS — J9621 Acute and chronic respiratory failure with hypoxia: Principal | ICD-10-CM | POA: Insufficient documentation

## 2022-11-09 DIAGNOSIS — J121 Respiratory syncytial virus pneumonia: Secondary | ICD-10-CM | POA: Diagnosis not present

## 2022-11-09 DIAGNOSIS — J45909 Unspecified asthma, uncomplicated: Secondary | ICD-10-CM | POA: Diagnosis not present

## 2022-11-09 DIAGNOSIS — Z7982 Long term (current) use of aspirin: Secondary | ICD-10-CM | POA: Insufficient documentation

## 2022-11-09 DIAGNOSIS — Z79899 Other long term (current) drug therapy: Secondary | ICD-10-CM | POA: Insufficient documentation

## 2022-11-09 DIAGNOSIS — Z87891 Personal history of nicotine dependence: Secondary | ICD-10-CM | POA: Insufficient documentation

## 2022-11-09 DIAGNOSIS — I10 Essential (primary) hypertension: Secondary | ICD-10-CM | POA: Diagnosis not present

## 2022-11-09 DIAGNOSIS — J189 Pneumonia, unspecified organism: Secondary | ICD-10-CM

## 2022-11-09 DIAGNOSIS — J449 Chronic obstructive pulmonary disease, unspecified: Secondary | ICD-10-CM

## 2022-11-09 DIAGNOSIS — J9601 Acute respiratory failure with hypoxia: Secondary | ICD-10-CM

## 2022-11-09 DIAGNOSIS — Z1152 Encounter for screening for COVID-19: Secondary | ICD-10-CM | POA: Diagnosis not present

## 2022-11-09 DIAGNOSIS — Z95828 Presence of other vascular implants and grafts: Secondary | ICD-10-CM

## 2022-11-09 LAB — TROPONIN I (HIGH SENSITIVITY)
Troponin I (High Sensitivity): 8 ng/L (ref <18)
Troponin I (High Sensitivity): 8 ng/L (ref <18)

## 2022-11-09 LAB — COMPREHENSIVE METABOLIC PANEL
ALT: 20 U/L (ref 0–44)
AST: 20 U/L (ref 15–41)
Albumin: 3 g/dL — ABNORMAL LOW (ref 3.5–5.0)
Alkaline Phosphatase: 62 U/L (ref 38–126)
Anion gap: 10 (ref 5–15)
BUN: 17 mg/dL (ref 8–23)
CO2: 33 mmol/L — ABNORMAL HIGH (ref 22–32)
Calcium: 8.1 mg/dL — ABNORMAL LOW (ref 8.9–10.3)
Chloride: 90 mmol/L — ABNORMAL LOW (ref 98–111)
Creatinine, Ser: 0.38 mg/dL — ABNORMAL LOW (ref 0.44–1.00)
GFR, Estimated: >60 mL/min (ref >60)
Glucose, Bld: 88 mg/dL (ref 70–99)
Potassium: 3.8 mmol/L (ref 3.5–5.1)
Sodium: 133 mmol/L — ABNORMAL LOW (ref 135–145)
Total Bilirubin: 0.7 mg/dL (ref 0.3–1.2)
Total Protein: 5.7 g/dL — ABNORMAL LOW (ref 6.5–8.1)

## 2022-11-09 LAB — CBC WITH DIFFERENTIAL/PLATELET
Abs Immature Granulocytes: 0.12 10*3/uL — ABNORMAL HIGH (ref 0.00–0.07)
Basophils Absolute: 0 10*3/uL (ref 0.0–0.1)
Basophils Relative: 0 %
Eosinophils Absolute: 0 10*3/uL (ref 0.0–0.5)
Eosinophils Relative: 0 %
HCT: 27.1 % — ABNORMAL LOW (ref 36.0–46.0)
Hemoglobin: 8.7 g/dL — ABNORMAL LOW (ref 12.0–15.0)
Immature Granulocytes: 1 %
Lymphocytes Relative: 9 %
Lymphs Abs: 1.2 10*3/uL (ref 0.7–4.0)
MCH: 32 pg (ref 26.0–34.0)
MCHC: 32.1 g/dL (ref 30.0–36.0)
MCV: 99.6 fL (ref 80.0–100.0)
Monocytes Absolute: 1.1 10*3/uL — ABNORMAL HIGH (ref 0.1–1.0)
Monocytes Relative: 9 %
Neutro Abs: 10.4 10*3/uL — ABNORMAL HIGH (ref 1.7–7.7)
Neutrophils Relative %: 81 %
Platelets: 218 10*3/uL (ref 150–400)
RBC: 2.72 MIL/uL — ABNORMAL LOW (ref 3.87–5.11)
RDW: 17.5 % — ABNORMAL HIGH (ref 11.5–15.5)
WBC: 12.8 10*3/uL — ABNORMAL HIGH (ref 4.0–10.5)
nRBC: 0.5 % — ABNORMAL HIGH (ref 0.0–0.2)

## 2022-11-09 LAB — BLOOD GAS, VENOUS
Acid-Base Excess: 14.2 mmol/L — ABNORMAL HIGH (ref 0.0–2.0)
Bicarbonate: 41 mmol/L — ABNORMAL HIGH (ref 20.0–28.0)
Drawn by: 442
O2 Saturation: 89.3 %
Patient temperature: 36.8
pCO2, Ven: 58 mmHg (ref 44–60)
pH, Ven: 7.45 — ABNORMAL HIGH (ref 7.25–7.43)
pO2, Ven: 55 mmHg — ABNORMAL HIGH (ref 32–45)

## 2022-11-09 LAB — RESP PANEL BY RT-PCR (RSV, FLU A&B, COVID)  RVPGX2
Influenza A by PCR: NEGATIVE
Influenza B by PCR: NEGATIVE
Resp Syncytial Virus by PCR: POSITIVE — AB
SARS Coronavirus 2 by RT PCR: NEGATIVE

## 2022-11-09 MED ORDER — SODIUM CHLORIDE 0.9 % IV SOLN
1.0000 g | INTRAVENOUS | Status: DC
  Administered 2022-11-10 – 2022-11-11 (×2): 1 g via INTRAVENOUS
  Filled 2022-11-09 (×2): qty 10

## 2022-11-09 MED ORDER — METHYLPREDNISOLONE SODIUM SUCC 125 MG IJ SOLR: 60.0000 mg | Freq: Two times a day (BID) | INTRAMUSCULAR | Status: DC

## 2022-11-09 MED ORDER — SODIUM CHLORIDE 0.9 % IV SOLN
500.0000 mg | INTRAVENOUS | Status: DC
  Administered 2022-11-09 – 2022-11-11 (×3): 500 mg via INTRAVENOUS
  Filled 2022-11-09 (×3): qty 5

## 2022-11-09 MED ORDER — MOMETASONE FURO-FORMOTEROL FUM 100-5 MCG/ACT IN AERO: 2.0000 | INHALATION_SPRAY | Freq: Two times a day (BID) | RESPIRATORY_TRACT | Status: DC

## 2022-11-09 MED ORDER — ENOXAPARIN SODIUM 40 MG/0.4ML IJ SOSY
40.0000 mg | PREFILLED_SYRINGE | INTRAMUSCULAR | Status: DC
  Administered 2022-11-09 – 2022-11-11 (×3): 40 mg via SUBCUTANEOUS
  Filled 2022-11-09 (×2): qty 0.4

## 2022-11-09 MED ORDER — UMECLIDINIUM BROMIDE 62.5 MCG/ACT IN AEPB: 1.0000 | INHALATION_SPRAY | Freq: Every day | RESPIRATORY_TRACT | Status: DC

## 2022-11-09 MED ORDER — ASPIRIN 81 MG PO TBEC
81.0000 mg | DELAYED_RELEASE_TABLET | Freq: Every day | ORAL | Status: DC
  Administered 2022-11-10 – 2022-11-12 (×3): 81 mg via ORAL
  Filled 2022-11-09 (×3): qty 1

## 2022-11-09 MED ORDER — IPRATROPIUM-ALBUTEROL 0.5-2.5 (3) MG/3ML IN SOLN: 3.0000 mL | Freq: Once | RESPIRATORY_TRACT | Status: AC

## 2022-11-09 MED ORDER — ATORVASTATIN CALCIUM 40 MG PO TABS
40.0000 mg | ORAL_TABLET | Freq: Every day | ORAL | Status: DC
  Administered 2022-11-10 – 2022-11-12 (×3): 40 mg via ORAL
  Filled 2022-11-09 (×3): qty 1

## 2022-11-09 MED ORDER — ONDANSETRON HCL 4 MG/2ML IJ SOLN
4.0000 mg | Freq: Four times a day (QID) | INTRAMUSCULAR | Status: DC | PRN
  Administered 2022-11-09: 4 mg via INTRAVENOUS
  Filled 2022-11-09: qty 2

## 2022-11-09 MED ORDER — ACETAMINOPHEN 650 MG RE SUPP: 650.0000 mg | Freq: Four times a day (QID) | RECTAL | Status: DC | PRN

## 2022-11-09 MED ORDER — GUAIFENESIN-DM 100-10 MG/5ML PO SYRP
10.0000 mL | ORAL_SOLUTION | Freq: Three times a day (TID) | ORAL | Status: AC
  Administered 2022-11-09 – 2022-11-10 (×4): 10 mL via ORAL
  Filled 2022-11-09 (×4): qty 10

## 2022-11-09 MED ORDER — IOHEXOL 300 MG/ML  SOLN
75.0000 mL | Freq: Once | INTRAMUSCULAR | Status: AC | PRN
  Administered 2022-11-09: 75 mL via INTRAVENOUS

## 2022-11-09 MED ORDER — SODIUM CHLORIDE 0.9 % IV SOLN
1.0000 g | Freq: Once | INTRAVENOUS | Status: AC
  Administered 2022-11-09: 1 g via INTRAVENOUS
  Filled 2022-11-09: qty 10

## 2022-11-09 MED ORDER — IPRATROPIUM-ALBUTEROL 0.5-2.5 (3) MG/3ML IN SOLN
RESPIRATORY_TRACT | Status: AC
  Administered 2022-11-09: 3 mL via RESPIRATORY_TRACT
  Filled 2022-11-09: qty 3

## 2022-11-09 MED ORDER — ONDANSETRON HCL 4 MG PO TABS: 4.0000 mg | ORAL_TABLET | Freq: Four times a day (QID) | ORAL | Status: DC | PRN

## 2022-11-09 MED ORDER — METHYLPREDNISOLONE SODIUM SUCC 125 MG IJ SOLR
125.0000 mg | Freq: Once | INTRAMUSCULAR | Status: AC
  Administered 2022-11-09: 125 mg via INTRAVENOUS
  Filled 2022-11-09: qty 2

## 2022-11-09 MED ORDER — BUDESON-GLYCOPYRROL-FORMOTEROL 160-9-4.8 MCG/ACT IN AERO: 2.0000 | INHALATION_SPRAY | Freq: Two times a day (BID) | RESPIRATORY_TRACT | Status: DC

## 2022-11-09 MED ORDER — ACETAMINOPHEN 325 MG PO TABS
650.0000 mg | ORAL_TABLET | Freq: Four times a day (QID) | ORAL | Status: DC | PRN
  Administered 2022-11-10: 650 mg via ORAL
  Filled 2022-11-09: qty 2

## 2022-11-09 MED ORDER — POLYETHYLENE GLYCOL 3350 17 G PO PACK: 17.0000 g | PACK | Freq: Every day | ORAL | Status: DC | PRN

## 2022-11-09 MED ORDER — PREDNISONE 20 MG PO TABS: 40.0000 mg | ORAL_TABLET | Freq: Every day | ORAL | Status: DC

## 2022-11-09 NOTE — Assessment & Plan Note (Addendum)
Marked reduced air entry bilaterally with dyspnea and productive cough,.  Recent prolonged hospitalization 12/11- 12/22- for same, with RSVP infection.  Antibiotics were discontinued as procalcitonin was negative.   - CT chest with contrast-  Lower lobe predominant cylindrical bronchiectasis, endobronchial debris, and lower lobe predominant centrilobular nodules,  Findings are likely due to airways centered infection or aspiration. -DuoNebs as needed and scheduled -Check COVID -Flutter valve -Mucolytic's -125 mg Solu-Medrol given, continue 60 twice daily -Continue Brovana and Yupelri added during recent hospitalization -Continue IV ceftriaxone and azithromycin - CBG BID -Per notes patient has end-stage COPD, and will require outpatient palliative consultation for further discussion of goals of care

## 2022-11-09 NOTE — Assessment & Plan Note (Signed)
O2 sats 89% on 6 L, currently on high flow nasal cannula.  Sats 91 to 100%.  She is on 4 L nasal cannula at baseline.  Respiratory failure likely due to acute exacerbation of her advanced COPD.

## 2022-11-09 NOTE — ED Provider Notes (Signed)
[facility] EMERGENCY DEPARTMENT Provider Note   CSN: [id number] Arrival date & time: 11/09/22  1540     History  Chief Complaint  Patient presents with   Shortness of Breath    [name] is a 66 y.o. female.  Pt is a 66 yo female with pmh of hypertension, hyperlipidemia, pulmonary emphysema, and frequent pneumonias discharged yesterday after admission for hypoxia secondary to RSV presenting in acute respiratory distress.  Family states the patient has had difficulty breathing since her discharge despite 4 L nasal cannula home oxygen.  As per fire department patient was satting at 80% on arrival.  Patient denies any chest pain but admits to overall chest tightness at this time.  Denies any subjective fevers or chills.  Denies any abdominal pain, nausea, vomiting.  Mitts to continued cough.  It is no longer an active smoker but states she did smoke for over 30 years.  The history is provided by the patient. No language interpreter was used.  Shortness of Breath Associated symptoms: cough   Associated symptoms: no abdominal pain, no chest pain, no ear pain, no fever, no rash, no sore throat and no vomiting        Home Medications Prior to Admission medications   Medication Sig Start Date End Date Taking? Authorizing Provider  albuterol (VENTOLIN HFA) 108 (90 Base) MCG/ACT inhaler Inhale 2 puffs into the lungs every 6 (six) hours as needed for wheezing or shortness of breath. 07/01/22   Roxan Hockey, MD  amLODipine (NORVASC) 5 MG tablet Take 5 mg by mouth at bedtime.     [provider]  aspirin EC 81 MG tablet Take 1 tablet (81 mg total) by mouth daily with breakfast. 07/01/22   Denton Brick, Courage, MD  atorvastatin (LIPITOR) 40 MG tablet Take 1 tablet (40 mg total) by mouth daily. 01/02/22   Dessa Phi, DO  Budeson-Glycopyrrol-Formoterol (BREZTRI AEROSPHERE) 160-9-4.8 MCG/ACT AERO Inhale 2 puffs into the lungs 2 (two) times daily. 09/10/22   Tanda Rockers, MD   ferrous sulfate 325 (65 FE) MG tablet Take 325 mg by mouth every other day.    [provider]  gabapentin (NEURONTIN) 600 MG tablet Take 1 tablet by mouth 3 (three) times daily. 10/17/22   [provider]  ipratropium (ATROVENT HFA) 17 MCG/ACT inhaler Inhale 2 puffs into the lungs every 6 (six) hours as needed for wheezing.    [provider]  levalbuterol Penne Lash) 1.25 MG/3ML nebulizer solution Take 1.25 mg by nebulization in the morning, at noon, and at bedtime. 11/08/22   Johnson, Clanford L, MD  magnesium oxide (MAG-OX) 400 (240 Mg) MG tablet Take 1 tablet (400 mg total) by mouth 2 (two) times daily. 09/06/22   Derek Jack, MD  nortriptyline (PAMELOR) 25 MG capsule Take 25 mg by mouth at bedtime.    [provider]  OXYGEN Inhale 3 L into the lungs continuous.    [provider]  pantoprazole (PROTONIX) 40 MG tablet Take 1 tablet (40 mg total) by mouth 2 (two) times daily. 07/01/22 10/28/22  Roxan Hockey, MD  predniSONE (DELTASONE) 20 MG tablet Take 3 PO QAM x5days, 2 PO QAM x5days, 1 PO QAM x5days, then return to 5 mg daily 11/09/22   Murlean Iba, MD      Allergies    Patient has no known allergies.    Review of Systems   Review of Systems  Constitutional:  Negative for chills and fever.  HENT:  Negative for ear  pain and sore throat.   Eyes:  Negative for pain and visual disturbance.  Respiratory:  Positive for cough and shortness of breath.   Cardiovascular:  Negative for chest pain and palpitations.  Gastrointestinal:  Negative for abdominal pain and vomiting.  Genitourinary:  Negative for dysuria and hematuria.  Musculoskeletal:  Negative for arthralgias and back pain.  Skin:  Negative for color change and rash.  Neurological:  Negative for seizures and syncope.  All other systems reviewed and are negative.   Physical Exam Updated Vital Signs BP 131/86   Pulse 89   Temp 98.1 F (36.7 C) (Oral)   Resp 20    Ht 5' (1.524 m)   Wt 51.7 kg   SpO2 91%   BMI 22.26 kg/m  Physical Exam Vitals and nursing note reviewed.  Constitutional:      General: She is not in acute distress.    Appearance: She is well-developed.  HENT:     Head: Normocephalic and atraumatic.  Eyes:     Conjunctiva/sclera: Conjunctivae normal.  Cardiovascular:     Rate and Rhythm: Regular rhythm. Tachycardia present.     Heart sounds: No murmur heard. Pulmonary:     Effort: Tachypnea, accessory muscle usage and respiratory distress present.     Breath sounds: Examination of the right-upper field reveals wheezing. Examination of the left-upper field reveals decreased breath sounds and wheezing. Examination of the right-middle field reveals wheezing. Examination of the left-middle field reveals wheezing. Examination of the right-lower field reveals wheezing. Examination of the left-lower field reveals wheezing. Decreased breath sounds and wheezing present.  Abdominal:     Palpations: Abdomen is soft.     Tenderness: There is no abdominal tenderness.  Musculoskeletal:        General: No swelling.     Cervical back: Neck supple.  Skin:    General: Skin is warm and dry.     Capillary Refill: Capillary refill takes less than 2 seconds.  Neurological:     Mental Status: She is alert.  Psychiatric:        Mood and Affect: Mood normal.     ED Results / Procedures / Treatments   Labs (all labs ordered are listed, but only abnormal results are displayed) Labs Reviewed  CBC WITH DIFFERENTIAL/PLATELET - Abnormal; Notable for the following components:      Result Value   WBC 12.8 (*)    RBC 2.72 (*)    Hemoglobin 8.7 (*)    HCT 27.1 (*)    RDW 17.5 (*)    nRBC 0.5 (*)    Neutro Abs 10.4 (*)    Monocytes Absolute 1.1 (*)    Abs Immature Granulocytes 0.12 (*)    All other components within normal limits  COMPREHENSIVE METABOLIC PANEL - Abnormal; Notable for the following components:   Sodium 133 (*)    Chloride 90 (*)     CO2 33 (*)    Creatinine, Ser 0.38 (*)    Calcium 8.1 (*)    Total Protein 5.7 (*)    Albumin 3.0 (*)    All other components within normal limits  BLOOD GAS, VENOUS - Abnormal; Notable for the following components:   pH, Ven 7.45 (*)    pO2, Ven 55 (*)    Bicarbonate 41.0 (*)    Acid-Base Excess 14.2 (*)    All other components within normal limits  TROPONIN I (HIGH SENSITIVITY)  TROPONIN I (HIGH SENSITIVITY)    EKG None  Radiology CT  Chest W Contrast  Result Date: 11/09/2022 CLINICAL DATA:  Pneumonia EXAM: CT CHEST WITH CONTRAST TECHNIQUE: Multidetector CT imaging of the chest was performed during intravenous contrast administration. RADIATION DOSE REDUCTION: This exam was performed according to the departmental dose-optimization program which includes automated exposure control, adjustment of the mA and/or kV according to patient size and/or use of iterative reconstruction technique. CONTRAST:  44m OMNIPAQUE IOHEXOL 300 MG/ML  SOLN COMPARISON:  Chest CT dated October 03, 2022 FINDINGS: Cardiovascular: Normal heart size. No pericardial effusion. Normal caliber thoracic aorta with moderate atherosclerotic disease. Moderate coronary artery calcifications. Mediastinum/Nodes: Small hiatal hernia thyroid is unremarkable. No pathologically enlarged lymph nodes seen in the chest. Lungs/Pleura: Central airways are patent. Moderate paraseptal emphysema. Unchanged biapical scarring. Bilateral lower lung predominant cylindrical bronchiectasis and scattered endobronchial debris. Lower lung predominant centrilobular nodules, some with tree-in-bud configuration, many of the nodules are new when compared with prior exam, others are stable. No consolidation, pleural effusion or pneumothorax. New irregular solid pulmonary nodule of the left upper lobe measuring 6 x 5 mm on series 4, image 31. Upper Abdomen: No acute abnormality. Musculoskeletal: No chest wall abnormality. No acute or significant osseous  findings. IMPRESSION: 1. Lower lobe predominant cylindrical bronchiectasis, endobronchial debris, and lower lobe predominant centrilobular nodules, many of the nodules are new when compared with prior exam. Findings are likely due to airways centered infection or aspiration. 2. Largest is a new irregular solid pulmonary nodule of the left upper lobe measuring 6 mm in mean diameter. Non-contrast chest CT at 3-6 months is recommended. If the nodules are stable at time of repeat CT, then future CT at 18-24 months (from today's scan) is considered optional for low-risk patients, but is recommended for high-risk patients. This recommendation follows the consensus statement: Guidelines for Management of Incidental Pulmonary Nodules Detected on CT Images: From the Fleischner Society 2017; Radiology 2017; 284:228-243. 3. Moderate coronary artery calcifications. 4. Aortic Atherosclerosis (ICD10-I70.0) and Emphysema (ICD10-J43.9). Electronically Signed   By: LYetta GlassmanM.D.   On: 11/09/2022 18:10   DG Chest Port 1 View  Result Date: 11/09/2022 CLINICAL DATA:  Shortness of breath. EXAM: PORTABLE CHEST 1 VIEW COMPARISON:  11/01/2022 FINDINGS: 1624 hours. Lungs are hyperexpanded. Interstitial markings are diffusely coarsened with chronic features. Similar appearance basilar interstitial scarring. No focal airspace consolidation or pulmonary edema. Left Port-A-Cath again noted. The cardiopericardial silhouette is within normal limits for size. IMPRESSION: Emphysema without acute cardiopulmonary findings. Electronically Signed   By: EMisty StanleyM.D.   On: 11/09/2022 16:32    Procedures .Critical Care  Performed by: GLianne Cure DO Authorized by: GLianne Cure DO   Critical care provider statement:    Critical care time (minutes):  100   Critical care was necessary to treat or prevent imminent or life-threatening deterioration of the following conditions:  Respiratory failure   Critical care was time  spent personally by me on the following activities:  Development of treatment plan with patient or surrogate, discussions with consultants, evaluation of patient's response to treatment, examination of patient, ordering and review of laboratory studies, ordering and review of radiographic studies, ordering and performing treatments and interventions, pulse oximetry, re-evaluation of patient's condition and review of old charts   Care discussed with: admitting provider       Medications Ordered in ED Medications  azithromycin (ZITHROMAX) 500 mg in sodium chloride 0.9 % 250 mL IVPB (has no administration in time range)  ipratropium-albuterol (DUONEB) 0.5-2.5 (3) MG/3ML nebulizer solution 3 mL (  3 mLs Nebulization Given 11/09/22 1558)  methylPREDNISolone sodium succinate (SOLU-MEDROL) 125 mg/2 mL injection 125 mg (125 mg Intravenous Given 11/09/22 1617)  iohexol (OMNIPAQUE) 300 MG/ML solution 75 mL (75 mLs Intravenous Contrast Given 11/09/22 1749)  cefTRIAXone (ROCEPHIN) 1 g in sodium chloride 0.9 % 100 mL IVPB (1 g Intravenous New Bag/Given 11/09/22 1933)    ED Course/ Medical Decision Making/ A&P                           Medical Decision Making Amount and/or Complexity of Data Reviewed Labs: ordered. Radiology: ordered.  Risk Prescription drug management. Decision regarding hospitalization.   8:05 PM  66 yo female with pmh of hypertension, hyperlipidemia, pulmonary emphysema, and frequent pneumonias discharged yesterday after admission for hypoxia secondary to RSV presenting in acute respiratory distress.  Patient presenting in acute respiratory distress with tachypnea, tachycardia, wheezing in all lung fields with diminished breath sounds on the left upper lobe.  Patient is otherwise alert and oriented x 3 with a GCS of 15.  DuoNebs currently running.  Solu-Medrol 125 mg IV administered.  Laboratory studies concerning for leukocytosis of 12.8 up from 7.8 on recent discharge 3 days ago.   Chest x-ray not confirmatory.  CT chest demonstrates lower lobe predominant cylindrical bronchiectasis, endobronchial debris, and lower lobe predominant centrilobular nodules, many of the nodules are new when compared with prior exam. Findings are likely due to airways centered infection or aspiration.  Will treat for pneumonia secondary to respiratory distress, new leukocytosis, and new oxygen requirements.  Laboratory studies also demonstrate anemia consistent with previous studies.  Denies any bleeding at this time. Recommending admission for acute respiratory failure with hypoxia likely secondary to pneumonia.  Patient agreeable to plan.  I spoke with many physician Dr. Clearence Ped who agrees to accept patient.         Final Clinical Impression(s) / ED Diagnoses Final diagnoses:  Pneumonia due to infectious organism, unspecified laterality, unspecified part of lung  Pulmonary nodules  Acute respiratory failure with hypoxia [facility])    Rx / DC Orders ED Discharge Orders     None         Lianne Cure, DO 64/33/29 2005

## 2022-11-09 NOTE — H&P (Signed)
History and Physical    [name]:[id number] DOB: 22-Nov-1955 DOA: 11/09/2022  PCP: [name], MD  Patient coming from: Home  I have personally briefly reviewed patient's old medical records in [place]  Chief Complaint: Difficulty breathing  HPI: [name] is a 66 y.o. female with medical history significant for ovarian cancer with peritoneal carcinomatosis, COPD, chronic respiratory failure on 4 L, hypertension. Presented to the ED with worsening difficulty breathing.   Patient was just discharged yesterday after prolonged hospitalization from 12/11 to 12/22, for acute COPD exacerbation with acute on chronic hypoxic respiratory failure and RSV infection.  Initially started on ceftriaxone and azithromycin, but this was discontinued with negative procalcitonin on day 2.  She reports since getting home her breathing worsened.  She has seen persistent cough productive of greenish sputum.  She reports chest tightness from difficulty breathing without chest pain.  Extremity swelling.  No fevers no chills.  She reports prior history of swallowing problems but has since resolved.  Denies any difficulty swallowing currently aspiration or choking episodes.  EMS reports O2 sats were 80% on her home 4 L.  Was placed on nonrebreather and round.  ED Course: O2 sats dropped to 89% on 6 L, she was initially placed on nonrebreather and switched to 6 L high flow nasal cannula. Chest x-ray without acute abnormality, subsequent CT chest with contrast showed lower lobe predominant cylindrical bronchiectasis, endobronchial debris's, and new lower lobe predominant centrilobular nodules, findings likely secondary to airway centered infection or aspiration.   IV Ceftriaxone and azithromycin given, DuoNebs and 105 mg Solu-Medrol given.   Review of Systems: As per HPI all other systems reviewed and negative.  Past Medical History:  Diagnosis Date   Anemia    Aortic atherosclerosis (HCC)     Arthritis    Asthma    Bilateral carotid artery stenosis    Cancer (HCC)    Gastric Cancer   COPD (chronic obstructive pulmonary disease) (HCC)    High cholesterol    Hyperlipidemia    Hypertension    Neuropathy    Osteoarthritis    Ovarian cancer ([place])    Oxygen dependent    Peripheral vascular disease ([place])    Peritoneal carcinoma ([place])    Pneumonia 2019   Port-A-Cath in place 07/03/2020   Pulmonary emphysema [facility])     Past Surgical History:  Procedure Laterality Date   BIOPSY  06/30/2022   Procedure: BIOPSY;  Surgeon: Eloise Harman, DO;  [facility];  Service: Endoscopy;;  gastric   BIOPSY  09/03/2022   Procedure: BIOPSY;  Surgeon: Harvel Quale, MD;  [facility];  Service: Gastroenterology;;   COLONOSCOPY WITH PROPOFOL N/A 09/03/2022   Procedure: COLONOSCOPY WITH PROPOFOL;  Surgeon: Harvel Quale, MD;  [facility];  Service: Gastroenterology;  Laterality: N/A;  900 ASA 3   ESOPHAGEAL BRUSHING  09/03/2022   Procedure: ESOPHAGEAL BRUSHING;  Surgeon: Montez Morita, Quillian Quince, MD;  [facility];  Service: Gastroenterology;;   ESOPHAGOGASTRODUODENOSCOPY (EGD) WITH PROPOFOL N/A 02/17/2021   Procedure: ESOPHAGOGASTRODUODENOSCOPY (EGD) WITH PROPOFOL;  Surgeon: Harvel Quale, MD;  [facility];  Service: Gastroenterology;  Laterality: N/A;   ESOPHAGOGASTRODUODENOSCOPY (EGD) WITH PROPOFOL N/A 05/04/2022   Procedure: ESOPHAGOGASTRODUODENOSCOPY (EGD) WITH PROPOFOL;  Surgeon: Daneil Dolin, MD;  [facility];  Service: Endoscopy;  Laterality: N/A;   ESOPHAGOGASTRODUODENOSCOPY (EGD) WITH PROPOFOL N/A 06/30/2022   Procedure: ESOPHAGOGASTRODUODENOSCOPY (EGD) WITH PROPOFOL;  Surgeon: Eloise Harman, DO;  [facility];  Service: Endoscopy;  Laterality: N/A;   ESOPHAGOGASTRODUODENOSCOPY (EGD) WITH PROPOFOL N/A 09/03/2022   Procedure: ESOPHAGOGASTRODUODENOSCOPY (EGD) WITH  PROPOFOL;  Surgeon: Harvel Quale, MD;  [facility];  Service: Gastroenterology;  Laterality: N/A;   HALLUX VALGUS BASE WEDGE Right 06/09/2015   Procedure: Base wedge osteotomy with modified McBride right foot ;  Surgeon: Sharlotte Alamo, MD;  Location: [facility];  Service: Podiatry;  Laterality: Right;   PORTACATH PLACEMENT Left 06/28/2020   Procedure: PORT-A-CATHETER PLACEMENT LEFT CHEST (attached catheter in left subclavian);  Surgeon: Virl Cagey, MD;  Location: [facility];  Service: General;  Laterality: Left;   TUBAL LIGATION     VIDEO BRONCHOSCOPY WITH ENDOBRONCHIAL NAVIGATION N/A 03/09/2021   Procedure: VIDEO BRONCHOSCOPY WITH ENDOBRONCHIAL NAVIGATION;  Surgeon: Ottie Glazier, MD;  Location: [facility];  Service: Thoracic;  Laterality: N/A;   VIDEO BRONCHOSCOPY WITH ENDOBRONCHIAL ULTRASOUND N/A 03/09/2021   Procedure: VIDEO BRONCHOSCOPY WITH ENDOBRONCHIAL ULTRASOUND;  Surgeon: Ottie Glazier, MD;  Location: [facility];  Service: Thoracic;  Laterality: N/A;     reports that she quit smoking about 9 years ago. Her smoking use included cigarettes. She has a 60.00 pack-year smoking history. She has never used smokeless tobacco. She reports that she does not drink alcohol and does not use drugs.  No Known Allergies  Family History  Problem Relation Age of Onset   Alzheimer's disease Mother    COPD Father    Emphysema Father    Hypertension Father    Healthy Sister    Healthy Brother    Alzheimer's disease Maternal Grandmother    Healthy Sister    Healthy Sister    Healthy Sister    Prostate cancer Other        paternal grandmother's brother; dx in early 84s   Breast cancer Neg Hx     Prior to Admission medications   Medication Sig Start Date End Date Taking? Authorizing Provider  albuterol (VENTOLIN HFA) 108 (90 Base) MCG/ACT inhaler Inhale 2 puffs into the lungs every 6 (six) hours as needed for wheezing or shortness of breath. 07/01/22   Roxan Hockey, MD   amLODipine (NORVASC) 5 MG tablet Take 5 mg by mouth at bedtime.     [provider]  aspirin EC 81 MG tablet Take 1 tablet (81 mg total) by mouth daily with breakfast. 07/01/22   Denton Brick, Courage, MD  atorvastatin (LIPITOR) 40 MG tablet Take 1 tablet (40 mg total) by mouth daily. 01/02/22   Dessa Phi, DO  Budeson-Glycopyrrol-Formoterol (BREZTRI AEROSPHERE) 160-9-4.8 MCG/ACT AERO Inhale 2 puffs into the lungs 2 (two) times daily. 09/10/22   Tanda Rockers, MD  ferrous sulfate 325 (65 FE) MG tablet Take 325 mg by mouth every other day.    [provider]  gabapentin (NEURONTIN) 600 MG tablet Take 1 tablet by mouth 3 (three) times daily. 10/17/22   [provider]  ipratropium (ATROVENT HFA) 17 MCG/ACT inhaler Inhale 2 puffs into the lungs every 6 (six) hours as needed for wheezing.    [provider]  levalbuterol Penne Lash) 1.25 MG/3ML nebulizer solution Take 1.25 mg by nebulization in the morning, at noon, and at bedtime. 11/08/22   Johnson, Clanford L, MD  magnesium oxide (MAG-OX) 400 (240 Mg) MG tablet Take 1 tablet (400 mg total) by mouth 2 (two) times daily. 09/06/22   Derek Jack, MD  nortriptyline (PAMELOR) 25 MG capsule Take 25 mg by mouth at  bedtime.    [provider]  OXYGEN Inhale 3 L into the lungs continuous.    [provider]  pantoprazole (PROTONIX) 40 MG tablet Take 1 tablet (40 mg total) by mouth 2 (two) times daily. 07/01/22 10/28/22  Roxan Hockey, MD  predniSONE (DELTASONE) 20 MG tablet Take 3 PO QAM x5days, 2 PO QAM x5days, 1 PO QAM x5days, then return to 5 mg daily 11/09/22   Murlean Iba, MD    Physical Exam: Vitals:   11/09/22 1817 11/09/22 1830 11/09/22 1900 11/09/22 1930  BP:  106/87 111/74 131/86  Pulse:  96 80 89  Resp: (!) 23 16 (!) 22 20  Temp:      TempSrc:      SpO2: 100% 92% 90% 91%  Weight:      Height:        Constitutional: Mild to moderate increased work of midodrine Vitals:    11/09/22 1817 11/09/22 1830 11/09/22 1900 11/09/22 1930  BP:  106/87 111/74 131/86  Pulse:  96 80 89  Resp: (!) 23 16 (!) 22 20  Temp:      TempSrc:      SpO2: 100% 92% 90% 91%  Weight:      Height:       Eyes: PERRL, lids and conjunctivae normal Posterior pharynx clear of any exudate or lesions.Normal dentition.  Neck: normal, supple, no masses, no thyromegaly Respiratory: mild to moderate increased work of breathing, marked reduced air entry bilaterally, crackles coarse left lung base  Cardiovascular: Regular rate and rhythm, no murmurs / rubs / gallops. No extremity edema.  Extremities warm.  Port-A-Cath left upper chest. Abdomen: no tenderness, no masses palpated. No hepatosplenomegaly. Bowel sounds positive.  Musculoskeletal: no clubbing / cyanosis. No joint deformity upper and lower extremities.  Skin: Diffuse purpura - senile and otherwise on bilateral upper extremities Neurologic: No apparent cranial nerve abnormality, moving extremities spontaneously. Psychiatric: Normal judgment and insight. Alert and oriented x 3. Normal mood.   Labs on Admission: I have personally reviewed following labs and imaging studies  CBC: Recent Labs  Lab 11/04/22 0411 11/06/22 0455 11/09/22 1602  WBC 6.5 7.8 12.8*  NEUTROABS  --   --  10.4*  HGB 7.9* 7.5* 8.7*  HCT 25.6* 24.7* 27.1*  MCV 103.2* 104.7* 99.6  PLT 202 182 102   Basic Metabolic Panel: Recent Labs  Lab 11/04/22 0411 11/06/22 0455 11/09/22 1602  NA 140 141 133*  K 3.6 4.0 3.8  CL 91* 97* 90*  CO2 40* 37* 33*  GLUCOSE 162* 145* 88  BUN '13 14 17  '$ CREATININE 0.53 0.45 0.38*  CALCIUM 8.3* 8.0* 8.1*  MG 1.9 1.9  --    GFR: Estimated Creatinine Clearance: 51 mL/min (A) (by C-G formula based on SCr of 0.38 mg/dL (L)). Liver Function Tests: Recent Labs  Lab 11/09/22 1602  AST 20  ALT 20  ALKPHOS 62  BILITOT 0.7  PROT 5.7*  ALBUMIN 3.0*   Urine analysis:    Component Value Date/Time   COLORURINE YELLOW  10/29/2022 0040   APPEARANCEUR CLEAR 10/29/2022 0040   APPEARANCEUR Clear 10/08/2013 2007   LABSPEC 1.012 10/29/2022 0040   LABSPEC 1.003 10/08/2013 2007   PHURINE 7.0 10/29/2022 0040   GLUCOSEU NEGATIVE 10/29/2022 0040   GLUCOSEU Negative 10/08/2013 2007   HGBUR NEGATIVE 10/29/2022 0040   BILIRUBINUR NEGATIVE 10/29/2022 0040   BILIRUBINUR Negative 10/08/2013 2007   KETONESUR NEGATIVE 10/29/2022 0040   PROTEINUR NEGATIVE 10/29/2022 0040   NITRITE  NEGATIVE 10/29/2022 0040   LEUKOCYTESUR NEGATIVE 10/29/2022 0040   LEUKOCYTESUR Negative 10/08/2013 2007    Radiological Exams on Admission: CT Chest W Contrast  Result Date: 11/09/2022 CLINICAL DATA:  Pneumonia EXAM: CT CHEST WITH CONTRAST TECHNIQUE: Multidetector CT imaging of the chest was performed during intravenous contrast administration. RADIATION DOSE REDUCTION: This exam was performed according to the departmental dose-optimization program which includes automated exposure control, adjustment of the mA and/or kV according to patient size and/or use of iterative reconstruction technique. CONTRAST:  7m OMNIPAQUE IOHEXOL 300 MG/ML  SOLN COMPARISON:  Chest CT dated October 03, 2022 FINDINGS: Cardiovascular: Normal heart size. No pericardial effusion. Normal caliber thoracic aorta with moderate atherosclerotic disease. Moderate coronary artery calcifications. Mediastinum/Nodes: Small hiatal hernia thyroid is unremarkable. No pathologically enlarged lymph nodes seen in the chest. Lungs/Pleura: Central airways are patent. Moderate paraseptal emphysema. Unchanged biapical scarring. Bilateral lower lung predominant cylindrical bronchiectasis and scattered endobronchial debris. Lower lung predominant centrilobular nodules, some with tree-in-bud configuration, many of the nodules are new when compared with prior exam, others are stable. No consolidation, pleural effusion or pneumothorax. New irregular solid pulmonary nodule of the left upper lobe  measuring 6 x 5 mm on series 4, image 31. Upper Abdomen: No acute abnormality. Musculoskeletal: No chest wall abnormality. No acute or significant osseous findings. IMPRESSION: 1. Lower lobe predominant cylindrical bronchiectasis, endobronchial debris, and lower lobe predominant centrilobular nodules, many of the nodules are new when compared with prior exam. Findings are likely due to airways centered infection or aspiration. 2. Largest is a new irregular solid pulmonary nodule of the left upper lobe measuring 6 mm in mean diameter. Non-contrast chest CT at 3-6 months is recommended. If the nodules are stable at time of repeat CT, then future CT at 18-24 months (from today's scan) is considered optional for low-risk patients, but is recommended for high-risk patients. This recommendation follows the consensus statement: Guidelines for Management of Incidental Pulmonary Nodules Detected on CT Images: From the Fleischner Society 2017; Radiology 2017; 284:228-243. 3. Moderate coronary artery calcifications. 4. Aortic Atherosclerosis (ICD10-I70.0) and Emphysema (ICD10-J43.9). Electronically Signed   By: LYetta GlassmanM.D.   On: 11/09/2022 18:10   DG Chest Port 1 View  Result Date: 11/09/2022 CLINICAL DATA:  Shortness of breath. EXAM: PORTABLE CHEST 1 VIEW COMPARISON:  11/01/2022 FINDINGS: 1624 hours. Lungs are hyperexpanded. Interstitial markings are diffusely coarsened with chronic features. Similar appearance basilar interstitial scarring. No focal airspace consolidation or pulmonary edema. Left Port-A-Cath again noted. The cardiopericardial silhouette is within normal limits for size. IMPRESSION: Emphysema without acute cardiopulmonary findings. Electronically Signed   By: EMisty StanleyM.D.   On: 11/09/2022 16:32    EKG: Independently reviewed.  Sinus rhythm, rate 86, QTc 414.  PACs present.  No significant change from prior.  Assessment/Plan Principal Problem:   Acute on chronic respiratory failure  with hypoxia (HCC) Active Problems:   COPD exacerbation (HCC)   COPD GOLD 4/ 02 dep   Ovarian cancer (HCC)   Essential hypertension   Port-A-Cath in place   RSV (respiratory syncytial virus pneumonia)   Assessment and Plan: * Acute on chronic respiratory failure with hypoxia (HCC) O2 sats 89% on 6 L, currently on high flow nasal cannula.  Sats 91 to 100%.  She is on 4 L nasal cannula at baseline.  Respiratory failure likely due to acute exacerbation of her advanced COPD.  COPD exacerbation (HCC) Marked reduced air entry bilaterally with dyspnea and productive cough,.  Recent prolonged hospitalization 12/11-  12/22- for same, with RSVP infection.  Antibiotics were discontinued as procalcitonin was negative.   - CT chest with contrast-  Lower lobe predominant cylindrical bronchiectasis, endobronchial debris, and lower lobe predominant centrilobular nodules,  Findings are likely due to airways centered infection or aspiration. -DuoNebs as needed and scheduled -Check COVID -Flutter valve -Mucolytic's -125 mg Solu-Medrol given, continue 60 twice daily -Continue Brovana and Yupelri added during recent hospitalization -Continue IV ceftriaxone and azithromycin - CBG BID -Per notes patient has end-stage COPD, and will require outpatient palliative consultation for further discussion of goals of care  Ovarian cancer [facility]) History of ovarian cancer with peritoneal carcinomatosis.  Follows with [name].  Diagnosed 2021.  Was on niraparib, this was held 10/07/22 due to severe thrombocytopenia anemia and leukopenia.  Essential hypertension Pending med rec resume Norvasc,  RSV (respiratory syncytial virus pneumonia) Recent RSV infection 12/11.   DVT prophylaxis: Lovenox Code Status: Full- Confirmed with patient and her daughter Mardene Celeste at bedside. Family Communication: Daughter Mardene Celeste at bedside Disposition Plan: ~/> 2 days Consults called: None Admission status: Obs stepdown     Author: Bethena Roys, MD 11/09/2022 9:07 PM  For on call review [email or web address].

## 2022-11-09 NOTE — ED Triage Notes (Signed)
Pt arrives via [facility]. EMS c/o SOB. Pt was recently admitted with SOB secondary to RSV. Pt denies pain currently. On fire dept arrival pt was 80% on baseline 4 lpm O2 via [place] and was placed on NRB. Pt given one albuterol tx enroute.

## 2022-11-09 NOTE — Assessment & Plan Note (Addendum)
Pending med rec resume Norvasc,

## 2022-11-09 NOTE — Assessment & Plan Note (Signed)
Recent RSV infection 12/11.

## 2022-11-09 NOTE — Assessment & Plan Note (Signed)
History of ovarian cancer with peritoneal carcinomatosis.  Follows with [name].  Diagnosed 2021.  Was on niraparib, this was held 10/07/22 due to severe thrombocytopenia anemia and leukopenia.

## 2022-11-10 DIAGNOSIS — J9621 Acute and chronic respiratory failure with hypoxia: Secondary | ICD-10-CM | POA: Diagnosis not present

## 2022-11-10 LAB — MRSA NEXT GEN BY PCR, NASAL: MRSA by PCR Next Gen: NOT DETECTED

## 2022-11-10 LAB — BASIC METABOLIC PANEL
Anion gap: 9 (ref 5–15)
BUN: 16 mg/dL (ref 8–23)
CO2: 36 mmol/L — ABNORMAL HIGH (ref 22–32)
Calcium: 8.4 mg/dL — ABNORMAL LOW (ref 8.9–10.3)
Chloride: 93 mmol/L — ABNORMAL LOW (ref 98–111)
Creatinine, Ser: 0.4 mg/dL — ABNORMAL LOW (ref 0.44–1.00)
GFR, Estimated: >60 mL/min (ref >60)
Glucose, Bld: 130 mg/dL — ABNORMAL HIGH (ref 70–99)
Potassium: 4.2 mmol/L (ref 3.5–5.1)
Sodium: 138 mmol/L (ref 135–145)

## 2022-11-10 LAB — CBC
HCT: 26.9 % — ABNORMAL LOW (ref 36.0–46.0)
Hemoglobin: 8.3 g/dL — ABNORMAL LOW (ref 12.0–15.0)
MCH: 31.3 pg (ref 26.0–34.0)
MCHC: 30.9 g/dL (ref 30.0–36.0)
MCV: 101.5 fL — ABNORMAL HIGH (ref 80.0–100.0)
Platelets: 222 10*3/uL (ref 150–400)
RBC: 2.65 MIL/uL — ABNORMAL LOW (ref 3.87–5.11)
RDW: 17.5 % — ABNORMAL HIGH (ref 11.5–15.5)
WBC: 7.6 10*3/uL (ref 4.0–10.5)
nRBC: 0.4 % — ABNORMAL HIGH (ref 0.0–0.2)

## 2022-11-10 LAB — CBG MONITORING, ED: Glucose-Capillary: 93 mg/dL (ref 70–99)

## 2022-11-10 LAB — GLUCOSE, CAPILLARY: Glucose-Capillary: 141 mg/dL — ABNORMAL HIGH (ref 70–99)

## 2022-11-10 MED ORDER — BUDESONIDE 0.25 MG/2ML IN SUSP
0.2500 mg | Freq: Two times a day (BID) | RESPIRATORY_TRACT | Status: DC
  Administered 2022-11-10 – 2022-11-12 (×5): 0.25 mg via RESPIRATORY_TRACT
  Filled 2022-11-10 (×6): qty 2

## 2022-11-10 MED ORDER — CHLORHEXIDINE GLUCONATE CLOTH 2 % EX PADS
6.0000 | MEDICATED_PAD | Freq: Every day | CUTANEOUS | Status: DC
  Administered 2022-11-10 – 2022-11-12 (×3): 6 via TOPICAL

## 2022-11-10 MED ORDER — FERROUS SULFATE 325 (65 FE) MG PO TABS
325.0000 mg | ORAL_TABLET | ORAL | Status: DC
  Administered 2022-11-10 – 2022-11-12 (×2): 325 mg via ORAL
  Filled 2022-11-10 (×2): qty 1

## 2022-11-10 MED ORDER — AMLODIPINE BESYLATE 5 MG PO TABS
5.0000 mg | ORAL_TABLET | Freq: Every day | ORAL | Status: DC
  Administered 2022-11-10 – 2022-11-11 (×2): 5 mg via ORAL
  Filled 2022-11-10 (×2): qty 1

## 2022-11-10 MED ORDER — GABAPENTIN 600 MG PO TABS
600.0000 mg | ORAL_TABLET | Freq: Three times a day (TID) | ORAL | Status: DC
  Administered 2022-11-10 (×2): 600 mg via ORAL
  Filled 2022-11-10 (×9): qty 1

## 2022-11-10 MED ORDER — ARFORMOTEROL TARTRATE 15 MCG/2ML IN NEBU
15.0000 ug | INHALATION_SOLUTION | Freq: Two times a day (BID) | RESPIRATORY_TRACT | Status: DC
  Administered 2022-11-10 – 2022-11-11 (×4): 15 ug via RESPIRATORY_TRACT
  Filled 2022-11-10 (×5): qty 2

## 2022-11-10 MED ORDER — IPRATROPIUM-ALBUTEROL 0.5-2.5 (3) MG/3ML IN SOLN
3.0000 mL | Freq: Four times a day (QID) | RESPIRATORY_TRACT | Status: DC
  Administered 2022-11-10 – 2022-11-11 (×5): 3 mL via RESPIRATORY_TRACT
  Filled 2022-11-10 (×6): qty 3

## 2022-11-10 MED ORDER — NORTRIPTYLINE HCL 25 MG PO CAPS
25.0000 mg | ORAL_CAPSULE | Freq: Every day | ORAL | Status: DC
  Administered 2022-11-10 – 2022-11-11 (×2): 25 mg via ORAL
  Filled 2022-11-10 (×3): qty 1

## 2022-11-10 MED ORDER — METHYLPREDNISOLONE SODIUM SUCC 125 MG IJ SOLR
60.0000 mg | Freq: Two times a day (BID) | INTRAMUSCULAR | Status: DC
  Administered 2022-11-10 – 2022-11-11 (×3): 60 mg via INTRAVENOUS
  Filled 2022-11-10 (×3): qty 2

## 2022-11-10 MED ORDER — REVEFENACIN 175 MCG/3ML IN SOLN
175.0000 ug | Freq: Every day | RESPIRATORY_TRACT | Status: DC
  Administered 2022-11-10 – 2022-11-12 (×3): 175 ug via RESPIRATORY_TRACT
  Filled 2022-11-10 (×5): qty 3

## 2022-11-10 NOTE — Care Management Obs Status (Signed)
[place] NOTIFICATION   Patient Details  Name: [name] MRN: [id number] Date of Birth: 04/21/1956   Medicare Observation Status Notification Given:  Yes    Shade Flood, LCSW 11/10/2022, 2:19 PM

## 2022-11-10 NOTE — Progress Notes (Signed)
PROGRESS NOTE    AMMI HUTT  ZHY:[id number] DOB: [birth year] DOA: 11/09/2022 PCP: [name], MD   Brief Narrative:    [name] is a 66 y.o. female with medical history significant for ovarian cancer with peritoneal carcinomatosis, COPD, chronic respiratory failure on 4 L, hypertension. Presented to the ED with worsening difficulty breathing.  Patient was just discharged yesterday after prolonged hospitalization from 12/11 to 12/22, for acute COPD exacerbation with acute on chronic hypoxic respiratory failure and RSV infection.  She presents with worsening dyspnea and acute on chronic hypoxemia in the setting of recurrent COPD exacerbation that is end-stage.  Palliative consulted for goals of care discussion.  Assessment & Plan:   Principal Problem:   Acute on chronic respiratory failure with hypoxia (HCC) Active Problems:   COPD exacerbation (HCC)   COPD GOLD 4/ 02 dep   Ovarian cancer (HCC)   Essential hypertension   Port-A-Cath in place   RSV (respiratory syncytial virus pneumonia)  Assessment and Plan:  Acute on chronic respiratory failure with hypoxia (HCC) O2 sats 89% on 6 L, currently on high flow nasal cannula.  Sats 91 to 100%.  She is on 4 L nasal cannula at baseline.  Respiratory failure likely due to acute exacerbation of her advanced COPD.   COPD exacerbation (HCC) Marked reduced air entry bilaterally with dyspnea and productive cough,.  Recent prolonged hospitalization 12/11- 12/22- for same, with RSVP infection.  Antibiotics were discontinued as procalcitonin was negative.   - CT chest with contrast-  Lower lobe predominant cylindrical bronchiectasis, endobronchial debris, and lower lobe predominant centrilobular nodules,  Findings are likely due to airways centered infection or aspiration. -DuoNebs as needed and scheduled -RSV still positive and COVID-negative -Flutter valve -Mucolytic's -125 mg Solu-Medrol given, continue 60 twice daily -Continue  Brovana and Yupelri added during recent hospitalization -Continue IV ceftriaxone and azithromycin - CBG BID -Per notes patient has end-stage COPD, and will require outpatient palliative consultation for further discussion of goals of care   Ovarian cancer [facility]) History of ovarian cancer with peritoneal carcinomatosis.  Follows with [name].  Diagnosed 2021.  Was on niraparib, this was held 10/07/22 due to severe thrombocytopenia anemia and leukopenia.   Essential hypertension Pending med rec resume Norvasc,   RSV (respiratory syncytial virus pneumonia) Recent RSV infection 12/11.    DVT prophylaxis: Lovenox Code Status: Full Family Communication: None at bedside Disposition Plan: Admit for COPD exacerbation treatment Status is: Observation The patient will require care spanning > 2 midnights and should be moved to inpatient because: Need for ongoing IV antibiotics and medications.  Consultants:  Palliative care  Procedures:  None  Antimicrobials:  Anti-infectives (From admission, onward)    Start     Dose/Rate Route Frequency Ordered Stop   11/10/22 1900  cefTRIAXone (ROCEPHIN) 1 g in sodium chloride 0.9 % 100 mL IVPB        1 g 200 mL/hr over 30 Minutes Intravenous Every 24 hours 11/09/22 2133     11/09/22 1930  azithromycin (ZITHROMAX) 500 mg in sodium chloride 0.9 % 250 mL IVPB        500 mg 250 mL/hr over 60 Minutes Intravenous Every 24 hours 11/09/22 1928     11/09/22 1930  cefTRIAXone (ROCEPHIN) 1 g in sodium chloride 0.9 % 100 mL IVPB        1 g 200 mL/hr over 30 Minutes Intravenous  Once 11/09/22 1928 11/09/22 2003      Subjective: Patient seen and  evaluated today with ongoing shortness of breath that is improved since admission.  She denies any chest tightness or significant cough.  Objective: Vitals:   11/10/22 0844 11/10/22 0900 11/10/22 0935 11/10/22 1200  BP:  (!) 121/53    Pulse:  83 88   Resp:  14 14   Temp:    98.5 F (36.9 C)  TempSrc:     Oral  SpO2:  99% 98%   Weight: 50.1 kg     Height: 5' (1.524 m)       Intake/Output Summary (Last 24 hours) at 11/10/2022 1312 Last data filed at 11/10/2022 0431 Gross per 24 hour  Intake 490 ml  Output 750 ml  Net -260 ml   Filed Weights   11/09/22 1543 11/10/22 0844  Weight: 51.7 kg 50.1 kg    Examination:  General exam: Appears calm and comfortable  Respiratory system: Congested and wheezing bilaterally.  On 4 L nasal cannula Cardiovascular system: S1 & S2 heard, RRR.  Gastrointestinal system: Abdomen is soft Central nervous system: Alert and awake Extremities: No edema Skin: No significant lesions noted Psychiatry: Flat affect.    Data Reviewed: I have personally reviewed following labs and imaging studies  CBC: Recent Labs  Lab 11/04/22 0411 11/06/22 0455 11/09/22 1602 11/10/22 0507  WBC 6.5 7.8 12.8* 7.6  NEUTROABS  --   --  10.4*  --   HGB 7.9* 7.5* 8.7* 8.3*  HCT 25.6* 24.7* 27.1* 26.9*  MCV 103.2* 104.7* 99.6 101.5*  PLT 202 182 218 563   Basic Metabolic Panel: Recent Labs  Lab 11/04/22 0411 11/06/22 0455 11/09/22 1602 11/10/22 0507  NA 140 141 133* 138  K 3.6 4.0 3.8 4.2  CL 91* 97* 90* 93*  CO2 40* 37* 33* 36*  GLUCOSE 162* 145* 88 130*  BUN '13 14 17 16  '$ CREATININE 0.53 0.45 0.38* 0.40*  CALCIUM 8.3* 8.0* 8.1* 8.4*  MG 1.9 1.9  --   --    GFR: Estimated Creatinine Clearance: 51 mL/min (A) (by C-G formula based on SCr of 0.4 mg/dL (L)). Liver Function Tests: Recent Labs  Lab 11/09/22 1602  AST 20  ALT 20  ALKPHOS 62  BILITOT 0.7  PROT 5.7*  ALBUMIN 3.0*   No results for input(s): "LIPASE", "AMYLASE" in the last 168 hours. No results for input(s): "AMMONIA" in the last 168 hours. Coagulation Profile: No results for input(s): "INR", "PROTIME" in the last 168 hours. Cardiac Enzymes: No results for input(s): "CKTOTAL", "CKMB", "CKMBINDEX", "TROPONINI" in the last 168 hours. BNP (last 3 results) No results for input(s): "PROBNP"  in the last 8760 hours. HbA1C: No results for input(s): "HGBA1C" in the last 72 hours. CBG: Recent Labs  Lab 11/10/22 0756 11/10/22 1114  GLUCAP 93 141*   Lipid Profile: No results for input(s): "CHOL", "HDL", "LDLCALC", "TRIG", "CHOLHDL", "LDLDIRECT" in the last 72 hours. Thyroid Function Tests: No results for input(s): "TSH", "T4TOTAL", "FREET4", "T3FREE", "THYROIDAB" in the last 72 hours. Anemia Panel: No results for input(s): "VITAMINB12", "FOLATE", "FERRITIN", "TIBC", "IRON", "RETICCTPCT" in the last 72 hours. Sepsis Labs: No results for input(s): "PROCALCITON", "LATICACIDVEN" in the last 168 hours.  Recent Results (from the past 240 hour(s))  Resp panel by RT-PCR (RSV, Flu A&B, Covid) Anterior Nasal Swab     Status: Abnormal   Collection Time: 11/09/22  8:59 PM   Specimen: Anterior Nasal Swab  Result Value Ref Range Status   SARS Coronavirus 2 by RT PCR NEGATIVE NEGATIVE Final  Comment: (NOTE) SARS-CoV-2 target nucleic acids are NOT DETECTED.  The SARS-CoV-2 RNA is generally detectable in upper respiratory specimens during the acute phase of infection. The lowest concentration of SARS-CoV-2 viral copies this assay can detect is 138 copies/mL. A negative result does not preclude SARS-Cov-2 infection and should not be used as the sole basis for treatment or other patient management decisions. A negative result may occur with  improper specimen collection/handling, submission of specimen other than nasopharyngeal swab, presence of viral mutation(s) within the areas targeted by this assay, and inadequate number of viral copies(<138 copies/mL). A negative result must be combined with clinical observations, patient history, and epidemiological information. The expected result is Negative.  Fact Sheet for Patients:  EntrepreneurPulse.com.au  Fact Sheet for Healthcare Providers:  IncredibleEmployment.be  This test is no t yet approved  or cleared by the [place] FDA and  has been authorized for detection and/or diagnosis of SARS-CoV-2 by FDA under an Emergency Use Authorization (EUA). This EUA will remain  in effect (meaning this test can be used) for the duration of the COVID-19 declaration under Section 564(b)(1) of the Act, 21 U.S.C.section 360bbb-3(b)(1), unless the authorization is terminated  or revoked sooner.       Influenza A by PCR NEGATIVE NEGATIVE Final   Influenza B by PCR NEGATIVE NEGATIVE Final    Comment: (NOTE) The Xpert Xpress SARS-CoV-2/FLU/RSV plus assay is intended as an aid in the diagnosis of influenza from Nasopharyngeal swab specimens and should not be used as a sole basis for treatment. Nasal washings and aspirates are unacceptable for Xpert Xpress SARS-CoV-2/FLU/RSV testing.  Fact Sheet for Patients: EntrepreneurPulse.com.au  Fact Sheet for Healthcare Providers: IncredibleEmployment.be  This test is not yet approved or cleared by the [place] FDA and has been authorized for detection and/or diagnosis of SARS-CoV-2 by FDA under an Emergency Use Authorization (EUA). This EUA will remain in effect (meaning this test can be used) for the duration of the COVID-19 declaration under Section 564(b)(1) of the Act, 21 U.S.C. section 360bbb-3(b)(1), unless the authorization is terminated or revoked.     Resp Syncytial Virus by PCR POSITIVE (A) NEGATIVE Final    Comment: (NOTE) Fact Sheet for Patients: EntrepreneurPulse.com.au  Fact Sheet for Healthcare Providers: IncredibleEmployment.be  This test is not yet approved or cleared by the [place] FDA and has been authorized for detection and/or diagnosis of SARS-CoV-2 by FDA under an Emergency Use Authorization (EUA). This EUA will remain in effect (meaning this test can be used) for the duration of the COVID-19 declaration under Section 564(b)(1) of the  Act, 21 U.S.C. section 360bbb-3(b)(1), unless the authorization is terminated or revoked.  Performed at [facility], [address] [place], [place] [address]   MRSA Next Gen by PCR, Nasal     Status: None   Collection Time: 11/10/22  9:15 AM   Specimen: Nasal Mucosa; Nasal Swab  Result Value Ref Range Status   MRSA by PCR Next Gen NOT DETECTED NOT DETECTED Final    Comment: (NOTE) The GeneXpert MRSA Assay (FDA approved for NASAL specimens only), is one component of a comprehensive MRSA colonization surveillance program. It is not intended to diagnose MRSA infection nor to guide or monitor treatment for MRSA infections. Test performance is not FDA approved in patients less than 69 years old. Performed at [facility], [address], [place], [place] [address]          Radiology Studies: CT Chest W Contrast  Result Date: 11/09/2022 CLINICAL  DATA:  Pneumonia EXAM: CT CHEST WITH CONTRAST TECHNIQUE: Multidetector CT imaging of the chest was performed during intravenous contrast administration. RADIATION DOSE REDUCTION: This exam was performed according to the departmental dose-optimization program which includes automated exposure control, adjustment of the mA and/or kV according to patient size and/or use of iterative reconstruction technique. CONTRAST:  68m OMNIPAQUE IOHEXOL 300 MG/ML  SOLN COMPARISON:  Chest CT dated October 03, 2022 FINDINGS: Cardiovascular: Normal heart size. No pericardial effusion. Normal caliber thoracic aorta with moderate atherosclerotic disease. Moderate coronary artery calcifications. Mediastinum/Nodes: Small hiatal hernia thyroid is unremarkable. No pathologically enlarged lymph nodes seen in the chest. Lungs/Pleura: Central airways are patent. Moderate paraseptal emphysema. Unchanged biapical scarring. Bilateral lower lung predominant cylindrical bronchiectasis and scattered endobronchial debris. Lower lung predominant centrilobular nodules, some with  tree-in-bud configuration, many of the nodules are new when compared with prior exam, others are stable. No consolidation, pleural effusion or pneumothorax. New irregular solid pulmonary nodule of the left upper lobe measuring 6 x 5 mm on series 4, image 31. Upper Abdomen: No acute abnormality. Musculoskeletal: No chest wall abnormality. No acute or significant osseous findings. IMPRESSION: 1. Lower lobe predominant cylindrical bronchiectasis, endobronchial debris, and lower lobe predominant centrilobular nodules, many of the nodules are new when compared with prior exam. Findings are likely due to airways centered infection or aspiration. 2. Largest is a new irregular solid pulmonary nodule of the left upper lobe measuring 6 mm in mean diameter. Non-contrast chest CT at 3-6 months is recommended. If the nodules are stable at time of repeat CT, then future CT at 18-24 months (from today's scan) is considered optional for low-risk patients, but is recommended for high-risk patients. This recommendation follows the consensus statement: Guidelines for Management of Incidental Pulmonary Nodules Detected on CT Images: From the Fleischner Society 2017; Radiology 2017; 284:228-243. 3. Moderate coronary artery calcifications. 4. Aortic Atherosclerosis (ICD10-I70.0) and Emphysema (ICD10-J43.9). Electronically Signed   By: LYetta GlassmanM.D.   On: 11/09/2022 18:10   DG Chest Port 1 View  Result Date: 11/09/2022 CLINICAL DATA:  Shortness of breath. EXAM: PORTABLE CHEST 1 VIEW COMPARISON:  11/01/2022 FINDINGS: 1624 hours. Lungs are hyperexpanded. Interstitial markings are diffusely coarsened with chronic features. Similar appearance basilar interstitial scarring. No focal airspace consolidation or pulmonary edema. Left Port-A-Cath again noted. The cardiopericardial silhouette is within normal limits for size. IMPRESSION: Emphysema without acute cardiopulmonary findings. Electronically Signed   By: EMisty StanleyM.D.    On: 11/09/2022 16:32        Scheduled Meds:  amLODipine  5 mg Oral QHS   arformoterol  15 mcg Nebulization BID   aspirin EC  81 mg Oral Q breakfast   atorvastatin  40 mg Oral Daily   budesonide (PULMICORT) nebulizer solution  0.25 mg Nebulization BID   Chlorhexidine Gluconate Cloth  6 each Topical Daily   enoxaparin (LOVENOX) injection  40 mg Subcutaneous Q24H   ferrous sulfate  325 mg Oral QODAY   gabapentin  600 mg Oral TID   guaiFENesin-dextromethorphan  10 mL Oral Q8H   ipratropium-albuterol  3 mL Nebulization Q6H   methylPREDNISolone (SOLU-MEDROL) injection  60 mg Intravenous Q12H   nortriptyline  25 mg Oral QHS   revefenacin  175 mcg Nebulization Daily   Continuous Infusions:  azithromycin Stopped (11/09/22 2105)   cefTRIAXone (ROCEPHIN)  IV       LOS: 0 days    Time spent: 35 minutes    Ameria Sanjurjo DDarleen Crocker DO [facility]  If  7PM-7AM, please contact night-coverage [email or web address] 11/10/2022, 1:12 PM

## 2022-11-10 NOTE — Progress Notes (Signed)
Report given to Lauren RN reflecting the patient's current status, patient will be transferring to room 305

## 2022-11-10 NOTE — TOC Initial Note (Signed)
Transition of Care [facility]) - Initial/Assessment Note    Patient Details  Name: [name] MRN: [id number] Date of Birth: [birth year]  Transition of Care [facility]) CM/SW Contact:    Shade Flood, LCSW Phone Number: 11/10/2022, 2:16 PM  Clinical Narrative:                  Pt was discharged home yesterday from [facility]. TOC from previous admission copied below. Will follow and assist if needs arise.  "Pt admitted with COPD exacerbation. Assessment completed due to high risk readmission score. Pt's daughter, Mardene Celeste reports she lives with pt. Pt is fairly independent with ADLs, but family takes care of household chores. Pt is on 3L home O2 ([employer]). Daughter reports no current home health services. PT evaluated pt and no follow up needed. Daughter provides transport to appointments. Mardene Celeste asked about assistance at home with household tasks and LCSW informed her that this would be private pay and home health does not assist with these things. Anticipate d/c tomorrow. Daughter aware. TOC will continue to follow."                   Expected Discharge Plan: Home/Self Care Barriers to Discharge: Continued Medical Work up   Patient Goals and CMS Choice Patient states their goals for this hospitalization and ongoing recovery are:: return home          Expected Discharge Plan and Services In-house Referral: Clinical Social Work     Living arrangements for the past 2 months: Apartment                                      Prior Living Arrangements/Services Living arrangements for the past 2 months: Apartment Lives with:: Adult Children Patient language and need for interpreter reviewed:: Yes Do you feel safe going back to the place where you live?: Yes      Need for Family Participation in Patient Care: Yes (Comment) Care giver support system in place?: Yes (comment) Current home services: DME Criminal Activity/Legal Involvement Pertinent to Current  Situation/Hospitalization: No - Comment as needed  Activities of Daily Living Home Assistive Devices/Equipment: Dentures (specify type), Eyeglasses, Oxygen, Walker (specify type) ADL Screening (condition at time of admission) Patient's cognitive ability adequate to safely complete daily activities?: Yes Is the patient deaf or have difficulty hearing?: No Does the patient have difficulty seeing, even when wearing glasses/contacts?: No Does the patient have difficulty concentrating, remembering, or making decisions?: No Patient able to express need for assistance with ADLs?: Yes Does the patient have difficulty dressing or bathing?: No Independently performs ADLs?: Yes (appropriate for developmental age) Does the patient have difficulty walking or climbing stairs?: No Weakness of Legs: None Weakness of Arms/Hands: None  Permission Sought/Granted                  Emotional Assessment   Attitude/Demeanor/Rapport: Engaged Affect (typically observed): Pleasant Orientation: : Oriented to Self, Oriented to Place, Oriented to  Time, Oriented to Situation Alcohol / Substance Use: Not Applicable Psych Involvement: No (comment)  Admission diagnosis:  Pulmonary nodules [R91.8] Acute respiratory failure with hypoxia ([place]) [J96.01] Pneumonia due to infectious organism, unspecified laterality, unspecified part of lung [J18.9] Acute on chronic hypoxic respiratory failure ([place]) [J96.21] Patient Active Problem List   Diagnosis Date Noted   Sepsis due to pneumonia ([place]) 10/29/2022   Dyslipidemia 10/29/2022   Essential tremor 10/29/2022  GERD without esophagitis 10/29/2022   Peripheral neuropathy 10/29/2022   RSV (respiratory syncytial virus pneumonia) 10/29/2022   Symptomatic anemia 10/03/2022   Macrocytic anemia 10/03/2022   Pancytopenia ([place]) 10/03/2022   Thrush 09/11/2022   Odynophagia 08/26/2022   COPD exacerbation ([place]) 07/31/2022   GERD (gastroesophageal reflux disease)  07/15/2022   ABLA (acute blood loss anemia)    Esophagitis 06/28/2022   Hematochezia 05/03/2022   E coli bacteremia 03/11/2022   Hypokalemia 03/11/2022   Hyponatremia 03/10/2022   Thrombocytopenia ([place]) 03/10/2022   Acute on chronic respiratory failure with hypoxia and hypercapnia ([place]) 12/30/2021   Deficiency anemia 05/23/2021   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   Sepsis due to undetermined organism ([place]) 12/18/2020   Lobar pneumonia ([place]) 12/18/2020   Syncope 12/17/2020   Genetic testing 08/01/2020   Ovarian cancer ([place]) 07/07/2020   Port-A-Cath in place 07/03/2020   Family history of prostate cancer 07/03/2020   Goals of care, counseling/discussion 07/01/2020   Peritoneal carcinomatosis ([place]) 06/06/2020   Pneumonia 09/13/2018   Acute on chronic respiratory failure ([place]) 01/22/2018   CAP (community acquired pneumonia) 01/18/2016   Iron deficiency anemia 11/07/2015   Chronic respiratory failure with hypoxia ([place]) 11/07/2015   Acute on chronic respiratory failure with hypoxia ([place]) 11/07/2015   Mixed hyperlipidemia 11/07/2015   COPD GOLD 4/ 02 dep 11/06/2015   Essential hypertension 11/06/2015   PCP:  [name], MD Pharmacy:   [facility] [place] [facility] [place] [address] [place] [address] Phone: [phone] Fax: [phone]     Social Determinants of Health ([place]) Social History: SDOH Screenings   Food Insecurity: No Food Insecurity (10/29/2022)  Housing: Low Risk  (10/29/2022)  Transportation Needs: No Transportation Needs (10/29/2022)  Utilities: Not At Risk (10/29/2022)  Alcohol Screen: Low Risk  (09/27/2020)  Depression (PHQ2-9): Low Risk  (09/27/2020)  Financial Resource Strain: Low Risk  (09/27/2020)  Physical Activity: Inactive (09/27/2020)  Social Connections: Socially Isolated (09/27/2020)  Stress: No Stress Concern Present (09/27/2020)  Tobacco Use: Medium Risk (11/09/2022)   SDOH  Interventions:     Readmission Risk Interventions    10/30/2022   10:52 AM 10/07/2022   11:16 AM 08/01/2022    2:58 PM  Readmission Risk Prevention Plan  Transportation Screening Complete Complete Complete  Medication Review Press photographer) Complete Complete Complete  HRI or Home Care Consult Complete Complete Complete  SW Recovery Care/Counseling Consult Complete Complete Complete  Palliative Care Screening Not Applicable Not Applicable Not Applicable  Skilled Nursing Facility Not Applicable Not Applicable Not Applicable

## 2022-11-11 DIAGNOSIS — J9621 Acute and chronic respiratory failure with hypoxia: Secondary | ICD-10-CM | POA: Diagnosis not present

## 2022-11-11 LAB — CBC
HCT: 24.4 % — ABNORMAL LOW (ref 36.0–46.0)
Hemoglobin: 7.7 g/dL — ABNORMAL LOW (ref 12.0–15.0)
MCH: 32.5 pg (ref 26.0–34.0)
MCHC: 31.6 g/dL (ref 30.0–36.0)
MCV: 103 fL — ABNORMAL HIGH (ref 80.0–100.0)
Platelets: 202 10*3/uL (ref 150–400)
RBC: 2.37 MIL/uL — ABNORMAL LOW (ref 3.87–5.11)
RDW: 17.4 % — ABNORMAL HIGH (ref 11.5–15.5)
WBC: 6.2 10*3/uL (ref 4.0–10.5)
nRBC: 0.3 % — ABNORMAL HIGH (ref 0.0–0.2)

## 2022-11-11 LAB — BASIC METABOLIC PANEL
Anion gap: 8 (ref 5–15)
BUN: 12 mg/dL (ref 8–23)
CO2: 35 mmol/L — ABNORMAL HIGH (ref 22–32)
Calcium: 8.4 mg/dL — ABNORMAL LOW (ref 8.9–10.3)
Chloride: 96 mmol/L — ABNORMAL LOW (ref 98–111)
Creatinine, Ser: 0.48 mg/dL (ref 0.44–1.00)
GFR, Estimated: >60 mL/min (ref >60)
Glucose, Bld: 125 mg/dL — ABNORMAL HIGH (ref 70–99)
Potassium: 4.4 mmol/L (ref 3.5–5.1)
Sodium: 139 mmol/L (ref 135–145)

## 2022-11-11 LAB — MAGNESIUM: Magnesium: 1.9 mg/dL (ref 1.7–2.4)

## 2022-11-11 MED ORDER — GABAPENTIN 300 MG PO CAPS
600.0000 mg | ORAL_CAPSULE | Freq: Three times a day (TID) | ORAL | Status: DC
  Administered 2022-11-11 – 2022-11-12 (×4): 600 mg via ORAL
  Filled 2022-11-11 (×4): qty 2

## 2022-11-11 MED ORDER — METHYLPREDNISOLONE SODIUM SUCC 40 MG IJ SOLR
40.0000 mg | Freq: Two times a day (BID) | INTRAMUSCULAR | Status: DC
  Administered 2022-11-11 – 2022-11-12 (×2): 40 mg via INTRAVENOUS
  Filled 2022-11-11 (×2): qty 1

## 2022-11-11 MED ORDER — ALUM & MAG HYDROXIDE-SIMETH 200-200-20 MG/5ML PO SUSP
15.0000 mL | ORAL | Status: DC | PRN
  Administered 2022-11-11 (×2): 15 mL via ORAL
  Filled 2022-11-11 (×2): qty 30

## 2022-11-11 MED ORDER — IPRATROPIUM-ALBUTEROL 0.5-2.5 (3) MG/3ML IN SOLN: 3.0000 mL | RESPIRATORY_TRACT | Status: DC | PRN

## 2022-11-11 MED ORDER — DM-GUAIFENESIN ER 30-600 MG PO TB12
1.0000 | ORAL_TABLET | Freq: Two times a day (BID) | ORAL | Status: DC
  Administered 2022-11-11 – 2022-11-12 (×3): 1 via ORAL
  Filled 2022-11-11 (×3): qty 1

## 2022-11-11 NOTE — Progress Notes (Signed)
PROGRESS NOTE    [name]  ZDG:[id number] DOB: 05/12/1956 DOA: 11/09/2022 PCP: [name], MD   Brief Narrative:    [name] is a 66 y.o. female with medical history significant for ovarian cancer with peritoneal carcinomatosis, COPD, chronic respiratory failure on 4 L, hypertension. Presented to the ED with worsening difficulty breathing.  Patient was just discharged yesterday after prolonged hospitalization from 12/11 to 12/22, for acute COPD exacerbation with acute on chronic hypoxic respiratory failure and RSV infection.  She presents with worsening dyspnea and acute on chronic hypoxemia in the setting of recurrent COPD exacerbation that is end-stage.  Palliative consulted for goals of care discussion.  Assessment & Plan:   Principal Problem:   Acute on chronic respiratory failure with hypoxia (HCC) Active Problems:   COPD exacerbation (HCC)   COPD GOLD 4/ 02 dep   Ovarian cancer (HCC)   Essential hypertension   Port-A-Cath in place   RSV (respiratory syncytial virus pneumonia)  Assessment and Plan:   Acute on chronic respiratory failure with hypoxia (HCC) O2 sats 89% on 6 L, currently on high flow nasal cannula.  Sats 91 to 100%.  She is on 4 L nasal cannula at baseline.  Respiratory failure likely due to acute exacerbation of her advanced COPD.   COPD exacerbation (HCC) Marked reduced air entry bilaterally with dyspnea and productive cough,.  Recent prolonged hospitalization 12/11- 12/22- for same, with RSVP infection.  Antibiotics were discontinued as procalcitonin was negative.   - CT chest with contrast-  Lower lobe predominant cylindrical bronchiectasis, endobronchial debris, and lower lobe predominant centrilobular nodules,  Findings are likely due to airways centered infection or aspiration. -DuoNebs as needed and scheduled -RSV still positive and COVID-negative -Flutter valve -Mucolytic's -125 mg Solu-Medrol given, wean soulmedrol to '40mg'$  BID  today -Continue Brovana and Yupelri added during recent hospitalization -Continue IV ceftriaxone and azithromycin - CBG BID -Per notes patient has end-stage COPD, and will require outpatient palliative consultation for further discussion of goals of care anticipate DC in 24 hours after discussion.   Ovarian cancer ([place]) History of ovarian cancer with peritoneal carcinomatosis.  Follows with [name].  Diagnosed 2021.  Was on niraparib, this was held 10/07/22 due to severe thrombocytopenia anemia and leukopenia.   Essential hypertension Pending med rec resume Norvasc,   RSV (respiratory syncytial virus pneumonia) Recent RSV infection 12/11.     DVT prophylaxis: Lovenox Code Status: Full Family Communication: None at bedside Disposition Plan: Admit for COPD exacerbation treatment Status is: Observation The patient will require care spanning > 2 midnights and should be moved to inpatient because: Need for ongoing IV antibiotics and medications.   Consultants:  Palliative care   Procedures:  None   Antimicrobials:  Anti-infectives (From admission, onward)    Start     Dose/Rate Route Frequency Ordered Stop   11/10/22 1900  cefTRIAXone (ROCEPHIN) 1 g in sodium chloride 0.9 % 100 mL IVPB        1 g 200 mL/hr over 30 Minutes Intravenous Every 24 hours 11/09/22 2133     11/09/22 1930  azithromycin (ZITHROMAX) 500 mg in sodium chloride 0.9 % 250 mL IVPB        500 mg 250 mL/hr over 60 Minutes Intravenous Every 24 hours 11/09/22 1928     11/09/22 1930  cefTRIAXone (ROCEPHIN) 1 g in sodium chloride 0.9 % 100 mL IVPB        1 g 200 mL/hr over 30 Minutes Intravenous  Once 11/09/22 1928 11/09/22 2003       Subjective: Patient seen and evaluated today with no new acute complaints or concerns. No acute concerns or events noted overnight. She is overall improved today with minimal dyspnea.  Objective: Vitals:   11/11/22 0815 11/11/22 0817 11/11/22 0823 11/11/22 0830  BP:       Pulse:      Resp:      Temp:      TempSrc:      SpO2: 98% 97% 98% 98%  Weight:      Height:        Intake/Output Summary (Last 24 hours) at 11/11/2022 0904 Last data filed at 11/11/2022 0500 Gross per 24 hour  Intake 480.57 ml  Output 900 ml  Net -419.43 ml   Filed Weights   11/09/22 1543 11/10/22 0844  Weight: 51.7 kg 50.1 kg    Examination:  General exam: Appears calm and comfortable  Respiratory system: Minimal wheezing bilaterally. Respiratory effort normal. 4L [place] Cardiovascular system: S1 & S2 heard, RRR.  Gastrointestinal system: Abdomen is soft Central nervous system: Alert and awake Extremities: No edema Skin: No significant lesions noted Psychiatry: Flat affect.    Data Reviewed: I have personally reviewed following labs and imaging studies  CBC: Recent Labs  Lab 11/06/22 0455 11/09/22 1602 11/10/22 0507 11/11/22 0457  WBC 7.8 12.8* 7.6 6.2  NEUTROABS  --  10.4*  --   --   HGB 7.5* 8.7* 8.3* 7.7*  HCT 24.7* 27.1* 26.9* 24.4*  MCV 104.7* 99.6 101.5* 103.0*  PLT 182 218 222 086   Basic Metabolic Panel: Recent Labs  Lab 11/06/22 0455 11/09/22 1602 11/10/22 0507 11/11/22 0457  NA 141 133* 138 139  K 4.0 3.8 4.2 4.4  CL 97* 90* 93* 96*  CO2 37* 33* 36* 35*  GLUCOSE 145* 88 130* 125*  BUN '14 17 16 12  '$ CREATININE 0.45 0.38* 0.40* 0.48  CALCIUM 8.0* 8.1* 8.4* 8.4*  MG 1.9  --   --  1.9   GFR: Estimated Creatinine Clearance: 51 mL/min (by C-G formula based on SCr of 0.48 mg/dL). Liver Function Tests: Recent Labs  Lab 11/09/22 1602  AST 20  ALT 20  ALKPHOS 62  BILITOT 0.7  PROT 5.7*  ALBUMIN 3.0*   No results for input(s): "LIPASE", "AMYLASE" in the last 168 hours. No results for input(s): "AMMONIA" in the last 168 hours. Coagulation Profile: No results for input(s): "INR", "PROTIME" in the last 168 hours. Cardiac Enzymes: No results for input(s): "CKTOTAL", "CKMB", "CKMBINDEX", "TROPONINI" in the last 168 hours. BNP (last 3  results) No results for input(s): "PROBNP" in the last 8760 hours. HbA1C: No results for input(s): "HGBA1C" in the last 72 hours. CBG: Recent Labs  Lab 11/10/22 0756 11/10/22 1114  GLUCAP 93 141*   Lipid Profile: No results for input(s): "CHOL", "HDL", "LDLCALC", "TRIG", "CHOLHDL", "LDLDIRECT" in the last 72 hours. Thyroid Function Tests: No results for input(s): "TSH", "T4TOTAL", "FREET4", "T3FREE", "THYROIDAB" in the last 72 hours. Anemia Panel: No results for input(s): "VITAMINB12", "FOLATE", "FERRITIN", "TIBC", "IRON", "RETICCTPCT" in the last 72 hours. Sepsis Labs: No results for input(s): "PROCALCITON", "LATICACIDVEN" in the last 168 hours.  Recent Results (from the past 240 hour(s))  Resp panel by RT-PCR (RSV, Flu A&B, Covid) Anterior Nasal Swab     Status: Abnormal   Collection Time: 11/09/22  8:59 PM   Specimen: Anterior Nasal Swab  Result Value Ref Range Status   SARS Coronavirus 2 by RT  PCR NEGATIVE NEGATIVE Final    Comment: (NOTE) SARS-CoV-2 target nucleic acids are NOT DETECTED.  The SARS-CoV-2 RNA is generally detectable in upper respiratory specimens during the acute phase of infection. The lowest concentration of SARS-CoV-2 viral copies this assay can detect is 138 copies/mL. A negative result does not preclude SARS-Cov-2 infection and should not be used as the sole basis for treatment or other patient management decisions. A negative result may occur with  improper specimen collection/handling, submission of specimen other than nasopharyngeal swab, presence of viral mutation(s) within the areas targeted by this assay, and inadequate number of viral copies(<138 copies/mL). A negative result must be combined with clinical observations, patient history, and epidemiological information. The expected result is Negative.  Fact Sheet for Patients:  EntrepreneurPulse.com.au  Fact Sheet for Healthcare Providers:   IncredibleEmployment.be  This test is no t yet approved or cleared by the [place] FDA and  has been authorized for detection and/or diagnosis of SARS-CoV-2 by FDA under an Emergency Use Authorization (EUA). This EUA will remain  in effect (meaning this test can be used) for the duration of the COVID-19 declaration under Section 564(b)(1) of the Act, 21 U.S.C.section 360bbb-3(b)(1), unless the authorization is terminated  or revoked sooner.       Influenza A by PCR NEGATIVE NEGATIVE Final   Influenza B by PCR NEGATIVE NEGATIVE Final    Comment: (NOTE) The Xpert Xpress SARS-CoV-2/FLU/RSV plus assay is intended as an aid in the diagnosis of influenza from Nasopharyngeal swab specimens and should not be used as a sole basis for treatment. Nasal washings and aspirates are unacceptable for Xpert Xpress SARS-CoV-2/FLU/RSV testing.  Fact Sheet for Patients: EntrepreneurPulse.com.au  Fact Sheet for Healthcare Providers: IncredibleEmployment.be  This test is not yet approved or cleared by the [place] FDA and has been authorized for detection and/or diagnosis of SARS-CoV-2 by FDA under an Emergency Use Authorization (EUA). This EUA will remain in effect (meaning this test can be used) for the duration of the COVID-19 declaration under Section 564(b)(1) of the Act, 21 U.S.C. section 360bbb-3(b)(1), unless the authorization is terminated or revoked.     Resp Syncytial Virus by PCR POSITIVE (A) NEGATIVE Final    Comment: (NOTE) Fact Sheet for Patients: EntrepreneurPulse.com.au  Fact Sheet for Healthcare Providers: IncredibleEmployment.be  This test is not yet approved or cleared by the [place] FDA and has been authorized for detection and/or diagnosis of SARS-CoV-2 by FDA under an Emergency Use Authorization (EUA). This EUA will remain in effect (meaning this test can be used)  for the duration of the COVID-19 declaration under Section 564(b)(1) of the Act, 21 U.S.C. section 360bbb-3(b)(1), unless the authorization is terminated or revoked.  Performed at [facility], [address], [place], [place] [address]   MRSA Next Gen by PCR, Nasal     Status: None   Collection Time: 11/10/22  9:15 AM   Specimen: Nasal Mucosa; Nasal Swab  Result Value Ref Range Status   MRSA by PCR Next Gen NOT DETECTED NOT DETECTED Final    Comment: (NOTE) The GeneXpert MRSA Assay (FDA approved for NASAL specimens only), is one component of a comprehensive MRSA colonization surveillance program. It is not intended to diagnose MRSA infection nor to guide or monitor treatment for MRSA infections. Test performance is not FDA approved in patients less than 20 years old. Performed at [facility], [address], [place],  [address]          Radiology Studies: CT Chest  W Contrast  Result Date: 11/09/2022 CLINICAL DATA:  Pneumonia EXAM: CT CHEST WITH CONTRAST TECHNIQUE: Multidetector CT imaging of the chest was performed during intravenous contrast administration. RADIATION DOSE REDUCTION: This exam was performed according to the departmental dose-optimization program which includes automated exposure control, adjustment of the mA and/or kV according to patient size and/or use of iterative reconstruction technique. CONTRAST:  32m OMNIPAQUE IOHEXOL 300 MG/ML  SOLN COMPARISON:  Chest CT dated October 03, 2022 FINDINGS: Cardiovascular: Normal heart size. No pericardial effusion. Normal caliber thoracic aorta with moderate atherosclerotic disease. Moderate coronary artery calcifications. Mediastinum/Nodes: Small hiatal hernia thyroid is unremarkable. No pathologically enlarged lymph nodes seen in the chest. Lungs/Pleura: Central airways are patent. Moderate paraseptal emphysema. Unchanged biapical scarring. Bilateral lower lung predominant cylindrical bronchiectasis and scattered  endobronchial debris. Lower lung predominant centrilobular nodules, some with tree-in-bud configuration, many of the nodules are new when compared with prior exam, others are stable. No consolidation, pleural effusion or pneumothorax. New irregular solid pulmonary nodule of the left upper lobe measuring 6 x 5 mm on series 4, image 31. Upper Abdomen: No acute abnormality. Musculoskeletal: No chest wall abnormality. No acute or significant osseous findings. IMPRESSION: 1. Lower lobe predominant cylindrical bronchiectasis, endobronchial debris, and lower lobe predominant centrilobular nodules, many of the nodules are new when compared with prior exam. Findings are likely due to airways centered infection or aspiration. 2. Largest is a new irregular solid pulmonary nodule of the left upper lobe measuring 6 mm in mean diameter. Non-contrast chest CT at 3-6 months is recommended. If the nodules are stable at time of repeat CT, then future CT at 18-24 months (from today's scan) is considered optional for low-risk patients, but is recommended for high-risk patients. This recommendation follows the consensus statement: Guidelines for Management of Incidental Pulmonary Nodules Detected on CT Images: From the Fleischner Society 2017; Radiology 2017; 284:228-243. 3. Moderate coronary artery calcifications. 4. Aortic Atherosclerosis (ICD10-I70.0) and Emphysema (ICD10-J43.9). Electronically Signed   By: LYetta GlassmanM.D.   On: 11/09/2022 18:10   DG Chest Port 1 View  Result Date: 11/09/2022 CLINICAL DATA:  Shortness of breath. EXAM: PORTABLE CHEST 1 VIEW COMPARISON:  11/01/2022 FINDINGS: 1624 hours. Lungs are hyperexpanded. Interstitial markings are diffusely coarsened with chronic features. Similar appearance basilar interstitial scarring. No focal airspace consolidation or pulmonary edema. Left Port-A-Cath again noted. The cardiopericardial silhouette is within normal limits for size. IMPRESSION: Emphysema without  acute cardiopulmonary findings. Electronically Signed   By: EMisty StanleyM.D.   On: 11/09/2022 16:32        Scheduled Meds:  amLODipine  5 mg Oral QHS   arformoterol  15 mcg Nebulization BID   aspirin EC  81 mg Oral Q breakfast   atorvastatin  40 mg Oral Daily   budesonide (PULMICORT) nebulizer solution  0.25 mg Nebulization BID   Chlorhexidine Gluconate Cloth  6 each Topical Daily   dextromethorphan-guaiFENesin  1 tablet Oral BID   enoxaparin (LOVENOX) injection  40 mg Subcutaneous Q24H   ferrous sulfate  325 mg Oral QODAY   gabapentin  600 mg Oral TID   ipratropium-albuterol  3 mL Nebulization Q6H   methylPREDNISolone (SOLU-MEDROL) injection  40 mg Intravenous Q12H   nortriptyline  25 mg Oral QHS   revefenacin  175 mcg Nebulization Daily   Continuous Infusions:  azithromycin 500 mg (11/10/22 2056)   cefTRIAXone (ROCEPHIN)  IV Stopped (11/10/22 1829)     LOS: 0 days    Time spent: 35 minutes  Natalia Wittmeyer Darleen Crocker, DO [facility]  If 7PM-7AM, please contact night-coverage [email or web address] 11/11/2022, 9:04 AM

## 2022-11-12 DIAGNOSIS — J9621 Acute and chronic respiratory failure with hypoxia: Secondary | ICD-10-CM | POA: Diagnosis not present

## 2022-11-12 MED ORDER — DM-GUAIFENESIN ER 30-600 MG PO TB12: 1.0000 | ORAL_TABLET | Freq: Two times a day (BID) | ORAL | 0 refills | Status: AC

## 2022-11-12 MED ORDER — AMOXICILLIN-POT CLAVULANATE 500-125 MG PO TABS: 1.0000 | ORAL_TABLET | Freq: Three times a day (TID) | ORAL | 0 refills | Status: AC

## 2022-11-12 MED ORDER — HEPARIN SOD (PORK) LOCK FLUSH 100 UNIT/ML IV SOLN
INTRAVENOUS | Status: AC
  Filled 2022-11-12: qty 5

## 2022-11-12 MED ORDER — PREDNISONE 10 MG PO TABS: 40.0000 mg | ORAL_TABLET | Freq: Every day | ORAL | 0 refills | Status: AC

## 2022-11-12 MED ORDER — MORPHINE SULFATE 15 MG PO TABS: ORAL_TABLET | ORAL | 0 refills | Status: DC

## 2022-11-12 MED ORDER — HEPARIN SOD (PORK) LOCK FLUSH 100 UNIT/ML IV SOLN: 500.0000 [IU] | Freq: Once | INTRAVENOUS | Status: AC

## 2022-11-12 NOTE — TOC Transition Note (Signed)
[facility]) - CM/SW Discharge Note   Patient Details  Name: [name] MRN: [id number] Date of Birth: June 07, 1956  [facility] CM/SW Contact:  Iona Beard, [place] Phone Number: 11/12/2022, 12:42 PM  Clinical Narrative:    CSW updated by Karena Addison with [facility], that they have received pts referral for outpatient palliative services. They will continue to follow pt at D/C. TOC signing off.   Final next level of care: Home/Self Care Barriers to Discharge: Barriers Resolved   Patient Goals and CMS Choice CMS Medicare.gov Compare Post Acute Care list provided to:: Patient Choice offered to / list presented to : Patient  Discharge Placement                         Discharge Plan and Services Additional resources added to the After Visit Summary for   In-house Referral: Clinical Social Work                                   Social Determinants of Health ([place]) Interventions SDOH Screenings   Food Insecurity: No Food Insecurity (10/29/2022)  Housing: Low Risk  (10/29/2022)  Transportation Needs: No Transportation Needs (10/29/2022)  Utilities: Not At Risk (10/29/2022)  Alcohol Screen: Low Risk  (09/27/2020)  Depression (PHQ2-9): Low Risk  (09/27/2020)  Financial Resource Strain: Low Risk  (09/27/2020)  Physical Activity: Inactive (09/27/2020)  Social Connections: Socially Isolated (09/27/2020)  Stress: No Stress Concern Present (09/27/2020)  Tobacco Use: Medium Risk (11/09/2022)     Readmission Risk Interventions    10/30/2022   10:52 AM 10/07/2022   11:16 AM 08/01/2022    2:58 PM  Readmission Risk Prevention Plan  Transportation Screening Complete Complete Complete  Medication Review Press photographer) Complete Complete Complete  HRI or [place] Complete Complete Complete  SW Recovery Care/Counseling Consult Complete Complete Complete  Palliative Care Screening Not Applicable Not Applicable Not [place] Not Applicable Not Applicable Not Applicable

## 2022-11-12 NOTE — Discharge Summary (Signed)
Physician Discharge Summary  [name]:[id number] DOB: [birth year] DOA: 11/09/2022  PCP: [name], MD  Admit date: 11/09/2022  Discharge date: 11/12/2022  Admitted From:Home  Disposition:  Home  Recommendations for Outpatient Follow-up:  Follow up with PCP in 1-2 weeks Continue on Augmentin and prednisone as noted below to complete course of treatment for community-acquired pneumonia Continue on morphine IR as prescribed for symptom management and palliation with referral to outpatient palliative care Continue other home medications as prior  Home Health: Outpatient palliative care  Equipment/Devices: Has home 4 L nasal cannula oxygen  Discharge Condition:Stable  CODE STATUS: Full  Diet recommendation: Heart Healthy  Brief/Interim Summary: [name] is a 66 y.o. female with medical history significant for ovarian cancer with peritoneal carcinomatosis, COPD, chronic respiratory failure on 4 L, hypertension. Presented to the ED with worsening difficulty breathing.  Patient was just discharged yesterday after prolonged hospitalization from 12/11 to 12/22, for acute COPD exacerbation with acute on chronic hypoxic respiratory failure and RSV infection.  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  She has responded well and appears to be at her baseline level of functioning at this point.  She requires outpatient palliative services and will be started on morphine as needed for any worsening dyspnea at this point.  No other acute events or concerns noted throughout the course of this admission.  Discharge Diagnoses:  Principal Problem:   Acute on chronic respiratory failure with hypoxia (HCC) Active Problems:   COPD exacerbation (HCC)   COPD GOLD 4/ 02 dep   Ovarian cancer ([place])   Essential hypertension    Port-A-Cath in place   RSV (respiratory syncytial virus pneumonia)  Principal discharge diagnosis: Acute on chronic hypoxemic respiratory failure secondary to recurrent COPD exacerbation in the setting of end-stage COPD and superimposed RSV infection as well as CAP.  Discharge Instructions  Discharge Instructions     Amb Referral to Palliative Care   Complete by: As directed    Diet - low sodium heart healthy   Complete by: As directed    Increase activity slowly   Complete by: As directed       Allergies as of 11/12/2022   No Known Allergies      Medication List     TAKE these medications    albuterol 108 (90 Base) MCG/ACT inhaler Commonly known as: VENTOLIN HFA Inhale 2 puffs into the lungs every 6 (six) hours as needed for wheezing or shortness of breath.   amLODipine 5 MG tablet Commonly known as: NORVASC Take 5 mg by mouth at bedtime.   amoxicillin-clavulanate 500-125 MG tablet Commonly known as: Augmentin Take 1 tablet by mouth 3 (three) times daily for 3 days.   aspirin EC 81 MG tablet Take 1 tablet (81 mg total) by mouth daily with breakfast.   atorvastatin 40 MG tablet Commonly known as: LIPITOR Take 1 tablet (40 mg total) by mouth daily.   Breztri Aerosphere 160-9-4.8 MCG/ACT Aero Generic drug: Budeson-Glycopyrrol-Formoterol Inhale 2 puffs into the lungs 2 (two) times daily.   dextromethorphan-guaiFENesin 30-600 MG 12hr tablet Commonly known as: MUCINEX DM Take 1 tablet by mouth 2 (two) times daily for 10 days.   ferrous sulfate 325 (65 FE) MG tablet Take 325 mg by mouth every other day.   gabapentin 600 MG tablet Commonly known as: NEURONTIN Take 1  tablet by mouth 2 (two) times daily.   ipratropium 17 MCG/ACT inhaler Commonly known as: ATROVENT HFA Inhale 2 puffs into the lungs every 6 (six) hours as needed for wheezing.   levalbuterol 1.25 MG/3ML nebulizer solution Commonly known as: XOPENEX Take 1.25 mg by nebulization in the morning,  at noon, and at bedtime.   magnesium oxide 400 (240 Mg) MG tablet Commonly known as: MAG-OX Take 1 tablet (400 mg total) by mouth 2 (two) times daily.   morphine 15 MG tablet Commonly known as: MSIR Use 1/2-1 tab every 4 hours as needed for shortness of breath.   nortriptyline 25 MG capsule Commonly known as: PAMELOR Take 25 mg by mouth at bedtime.   OXYGEN Inhale 3 L into the lungs continuous.   pantoprazole 40 MG tablet Commonly known as: Protonix Take 1 tablet (40 mg total) by mouth 2 (two) times daily.   predniSONE 10 MG tablet Commonly known as: DELTASONE Take 4 tablets (40 mg total) by mouth daily for 5 days. What changed:  medication strength how much to take how to take this when to take this additional instructions        Follow-up Information     Adamo, Hattie Perch, MD. Schedule an appointment as soon as possible for a visit in 1 week(s).   Specialty: Family Medicine Contact information: [place] [address] [phone]                No Known Allergies  Consultations: Discussed with palliative care   Procedures/Studies: CT Chest W Contrast  Result Date: 11/09/2022 CLINICAL DATA:  Pneumonia EXAM: CT CHEST WITH CONTRAST TECHNIQUE: Multidetector CT imaging of the chest was performed during intravenous contrast administration. RADIATION DOSE REDUCTION: This exam was performed according to the departmental dose-optimization program which includes automated exposure control, adjustment of the mA and/or kV according to patient size and/or use of iterative reconstruction technique. CONTRAST:  76m OMNIPAQUE IOHEXOL 300 MG/ML  SOLN COMPARISON:  Chest CT dated October 03, 2022 FINDINGS: Cardiovascular: Normal heart size. No pericardial effusion. Normal caliber thoracic aorta with moderate atherosclerotic disease. Moderate coronary artery calcifications. Mediastinum/Nodes: Small hiatal hernia thyroid is unremarkable. No pathologically  enlarged lymph nodes seen in the chest. Lungs/Pleura: Central airways are patent. Moderate paraseptal emphysema. Unchanged biapical scarring. Bilateral lower lung predominant cylindrical bronchiectasis and scattered endobronchial debris. Lower lung predominant centrilobular nodules, some with tree-in-bud configuration, many of the nodules are new when compared with prior exam, others are stable. No consolidation, pleural effusion or pneumothorax. New irregular solid pulmonary nodule of the left upper lobe measuring 6 x 5 mm on series 4, image 31. Upper Abdomen: No acute abnormality. Musculoskeletal: No chest wall abnormality. No acute or significant osseous findings. IMPRESSION: 1. Lower lobe predominant cylindrical bronchiectasis, endobronchial debris, and lower lobe predominant centrilobular nodules, many of the nodules are new when compared with prior exam. Findings are likely due to airways centered infection or aspiration. 2. Largest is a new irregular solid pulmonary nodule of the left upper lobe measuring 6 mm in mean diameter. Non-contrast chest CT at 3-6 months is recommended. If the nodules are stable at time of repeat CT, then future CT at 18-24 months (from today's scan) is considered optional for low-risk patients, but is recommended for high-risk patients. This recommendation follows the consensus statement: Guidelines for Management of Incidental Pulmonary Nodules Detected on CT Images: From the Fleischner Society 2017; Radiology 2017; 284:228-243. 3. Moderate coronary artery calcifications. 4. Aortic Atherosclerosis (ICD10-I70.0) and  Emphysema (ICD10-J43.9). Electronically Signed   By: Yetta Glassman M.D.   On: 11/09/2022 18:10   DG Chest Port 1 View  Result Date: 11/09/2022 CLINICAL DATA:  Shortness of breath. EXAM: PORTABLE CHEST 1 VIEW COMPARISON:  11/01/2022 FINDINGS: 1624 hours. Lungs are hyperexpanded. Interstitial markings are diffusely coarsened with chronic features. Similar  appearance basilar interstitial scarring. No focal airspace consolidation or pulmonary edema. Left Port-A-Cath again noted. The cardiopericardial silhouette is within normal limits for size. IMPRESSION: Emphysema without acute cardiopulmonary findings. Electronically Signed   By: Misty Stanley M.D.   On: 11/09/2022 16:32   DG CHEST PORT 1 VIEW  Result Date: 11/01/2022 CLINICAL DATA:  Dyspnea and respiratory abnormalities EXAM: PORTABLE CHEST 1 VIEW COMPARISON:  Radiograph 10/30/2022 FINDINGS: Chest port catheter tip overlies the superior cavoatrial junction. Unchanged cardiomediastinal silhouette. Pulmonary hyperinflation. Emphysema and bronchial wall thickening. There is no focal airspace consolidation. No large pleural effusion or evidence of pneumothorax. Bones are unchanged. IMPRESSION: Findings of COPD.  No new airspace disease. Electronically Signed   By: Maurine Simmering M.D.   On: 11/01/2022 12:33   DG CHEST PORT 1 VIEW  Result Date: 10/30/2022 CLINICAL DATA:  Dyspnea EXAM: PORTABLE CHEST 1 VIEW COMPARISON:  Previous studies including the examination of 10/28/2022 FINDINGS: Cardiac size is within normal limits. Flattening of diaphragms suggests COPD. There are no signs of pulmonary edema or focal pulmonary consolidation. There is slight prominence of interstitial markings in both lungs. Focal increased markings are seen in the medial left lower lung field with no significant change. There is slight improvement in aeration in the lateral aspect of left lower lung field. There is no pleural effusion or pneumothorax. Tip of left subclavian chest port is seen in superior vena cava. IMPRESSION: COPD. There is slight prominence of interstitial markings in both lungs suggesting possible chronic interstitial lung disease with scarring. There is focal increased markings in the medial left lower lung fields suggesting interstitial pneumonia or more pronounced scarring. There is no new focal pulmonary  consolidation. There are no signs of alveolar pulmonary edema. Electronically Signed   By: Elmer Picker M.D.   On: 10/30/2022 17:03   DG Chest 2 View  Result Date: 10/28/2022 CLINICAL DATA:  Shortness of breath EXAM: CHEST - 2 VIEW COMPARISON:  Previous studies including the examination of 10/07/2022 FINDINGS: Cardiac size is within normal limits. Increase in AP diameter of chest suggests COPD. Increased interstitial markings are seen in left lower lung field. There is ectasia of bronchi in the upper lung fields. Emphysematous changes are noted in the upper lung fields. There is no significant pleural effusion or pneumothorax. There is left subclavian central venous catheter with its tip in superior vena cava. IMPRESSION: COPD. Bronchiectasis. Increased interstitial markings in the left lower lung fields may suggest scarring or interstitial pneumonia superimposed over fibrosis. Electronically Signed   By: Elmer Picker M.D.   On: 10/28/2022 16:56     Discharge Exam: Vitals:   11/12/22 0451 11/12/22 0750  BP: (!) 106/53   Pulse: 84   Resp: 16   Temp: 98.5 F (36.9 C)   SpO2: 98% 96%   Vitals:   11/11/22 1955 11/11/22 2025 11/12/22 0451 11/12/22 0750  BP: 117/67  (!) 106/53   Pulse: 90  84   Resp: 16  16   Temp: 98.4 F (36.9 C)  98.5 F (36.9 C)   TempSrc: Oral  Oral   SpO2: 97% 96% 98% 96%  Weight:  Height:        General: Pt is alert, awake, not in acute distress Cardiovascular: RRR, S1/S2 +, no rubs, no gallops Respiratory: CTA bilaterally, no wheezing, no rhonchi, 4 L nasal cannula oxygen Abdominal: Soft, NT, ND, bowel sounds + Extremities: no edema, no cyanosis    The results of significant diagnostics from this hospitalization (including imaging, microbiology, ancillary and laboratory) are listed below for reference.     Microbiology: Recent Results (from the past 240 hour(s))  Resp panel by RT-PCR (RSV, Flu A&B, Covid) Anterior Nasal Swab      Status: Abnormal   Collection Time: 11/09/22  8:59 PM   Specimen: Anterior Nasal Swab  Result Value Ref Range Status   SARS Coronavirus 2 by RT PCR NEGATIVE NEGATIVE Final    Comment: (NOTE) SARS-CoV-2 target nucleic acids are NOT DETECTED.  The SARS-CoV-2 RNA is generally detectable in upper respiratory specimens during the acute phase of infection. The lowest concentration of SARS-CoV-2 viral copies this assay can detect is 138 copies/mL. A negative result does not preclude SARS-Cov-2 infection and should not be used as the sole basis for treatment or other patient management decisions. A negative result may occur with  improper specimen collection/handling, submission of specimen other than nasopharyngeal swab, presence of viral mutation(s) within the areas targeted by this assay, and inadequate number of viral copies(<138 copies/mL). A negative result must be combined with clinical observations, patient history, and epidemiological information. The expected result is Negative.  Fact Sheet for Patients:  EntrepreneurPulse.com.au  Fact Sheet for Healthcare Providers:  IncredibleEmployment.be  This test is no t yet approved or cleared by the [place] FDA and  has been authorized for detection and/or diagnosis of SARS-CoV-2 by FDA under an Emergency Use Authorization (EUA). This EUA will remain  in effect (meaning this test can be used) for the duration of the COVID-19 declaration under Section 564(b)(1) of the Act, 21 U.S.C.section 360bbb-3(b)(1), unless the authorization is terminated  or revoked sooner.       Influenza A by PCR NEGATIVE NEGATIVE Final   Influenza B by PCR NEGATIVE NEGATIVE Final    Comment: (NOTE) The Xpert Xpress SARS-CoV-2/FLU/RSV plus assay is intended as an aid in the diagnosis of influenza from Nasopharyngeal swab specimens and should not be used as a sole basis for treatment. Nasal washings and aspirates are  unacceptable for Xpert Xpress SARS-CoV-2/FLU/RSV testing.  Fact Sheet for Patients: EntrepreneurPulse.com.au  Fact Sheet for Healthcare Providers: IncredibleEmployment.be  This test is not yet approved or cleared by the [place] FDA and has been authorized for detection and/or diagnosis of SARS-CoV-2 by FDA under an Emergency Use Authorization (EUA). This EUA will remain in effect (meaning this test can be used) for the duration of the COVID-19 declaration under Section 564(b)(1) of the Act, 21 U.S.C. section 360bbb-3(b)(1), unless the authorization is terminated or revoked.     Resp Syncytial Virus by PCR POSITIVE (A) NEGATIVE Final    Comment: (NOTE) Fact Sheet for Patients: EntrepreneurPulse.com.au  Fact Sheet for Healthcare Providers: IncredibleEmployment.be  This test is not yet approved or cleared by the [place] FDA and has been authorized for detection and/or diagnosis of SARS-CoV-2 by FDA under an Emergency Use Authorization (EUA). This EUA will remain in effect (meaning this test can be used) for the duration of the COVID-19 declaration under Section 564(b)(1) of the Act, 21 U.S.C. section 360bbb-3(b)(1), unless the authorization is terminated or revoked.  Performed at [facility], [address] [place],  [place] [address]   MRSA Next Gen by PCR, Nasal     Status: None   Collection Time: 11/10/22  9:15 AM   Specimen: Nasal Mucosa; Nasal Swab  Result Value Ref Range Status   MRSA by PCR Next Gen NOT DETECTED NOT DETECTED Final    Comment: (NOTE) The GeneXpert MRSA Assay (FDA approved for NASAL specimens only), is one component of a comprehensive MRSA colonization surveillance program. It is not intended to diagnose MRSA infection nor to guide or monitor treatment for MRSA infections. Test performance is not FDA approved in patients less than 3 years old. Performed at [facility], [address], [place], [place] [address]      Labs: BNP (last 3 results) Recent Labs    06/28/22 1203 07/31/22 1745 10/03/22 1817  BNP 27.0 29.0 83.4   Basic Metabolic Panel: Recent Labs  Lab 11/06/22 0455 11/09/22 1602 11/10/22 0507 11/11/22 0457  NA 141 133* 138 139  K 4.0 3.8 4.2 4.4  CL 97* 90* 93* 96*  CO2 37* 33* 36* 35*  GLUCOSE 145* 88 130* 125*  BUN '14 17 16 12  '$ CREATININE 0.45 0.38* 0.40* 0.48  CALCIUM 8.0* 8.1* 8.4* 8.4*  MG 1.9  --   --  1.9   Liver Function Tests: Recent Labs  Lab 11/09/22 1602  AST 20  ALT 20  ALKPHOS 62  BILITOT 0.7  PROT 5.7*  ALBUMIN 3.0*   No results for input(s): "LIPASE", "AMYLASE" in the last 168 hours. No results for input(s): "AMMONIA" in the last 168 hours. CBC: Recent Labs  Lab 11/06/22 0455 11/09/22 1602 11/10/22 0507 11/11/22 0457  WBC 7.8 12.8* 7.6 6.2  NEUTROABS  --  10.4*  --   --   HGB 7.5* 8.7* 8.3* 7.7*  HCT 24.7* 27.1* 26.9* 24.4*  MCV 104.7* 99.6 101.5* 103.0*  PLT 182 218 222 202   Cardiac Enzymes: No results for input(s): "CKTOTAL", "CKMB", "CKMBINDEX", "TROPONINI" in the last 168 hours. BNP: Invalid input(s): "POCBNP" CBG: Recent Labs  Lab 11/10/22 0756 11/10/22 1114  GLUCAP 93 141*   D-Dimer No results for input(s): "DDIMER" in the last 72 hours. Hgb A1c No results for input(s): "HGBA1C" in the last 72 hours. Lipid Profile No results for input(s): "CHOL", "HDL", "LDLCALC", "TRIG", "CHOLHDL", "LDLDIRECT" in the last 72 hours. Thyroid function studies No results for input(s): "TSH", "T4TOTAL", "T3FREE", "THYROIDAB" in the last 72 hours.  Invalid input(s): "FREET3" Anemia work up No results for input(s): "VITAMINB12", "FOLATE", "FERRITIN", "TIBC", "IRON", "RETICCTPCT" in the last 72 hours. Urinalysis    Component Value Date/Time   COLORURINE YELLOW 10/29/2022 0040   APPEARANCEUR CLEAR 10/29/2022 0040   APPEARANCEUR Clear 10/08/2013 2007   LABSPEC 1.012 10/29/2022 0040    LABSPEC 1.003 10/08/2013 2007   PHURINE 7.0 10/29/2022 0040   GLUCOSEU NEGATIVE 10/29/2022 0040   GLUCOSEU Negative 10/08/2013 2007   HGBUR NEGATIVE 10/29/2022 0040   BILIRUBINUR NEGATIVE 10/29/2022 0040   BILIRUBINUR Negative 10/08/2013 2007   KETONESUR NEGATIVE 10/29/2022 0040   PROTEINUR NEGATIVE 10/29/2022 0040   NITRITE NEGATIVE 10/29/2022 0040   LEUKOCYTESUR NEGATIVE 10/29/2022 0040   LEUKOCYTESUR Negative 10/08/2013 2007   Sepsis Labs Recent Labs  Lab 11/06/22 0455 11/09/22 1602 11/10/22 0507 11/11/22 0457  WBC 7.8 12.8* 7.6 6.2   Microbiology Recent Results (from the past 240 hour(s))  Resp panel by RT-PCR (RSV, Flu A&B, Covid) Anterior Nasal Swab     Status: Abnormal   Collection Time: 11/09/22  8:59 PM  Specimen: Anterior Nasal Swab  Result Value Ref Range Status   SARS Coronavirus 2 by RT PCR NEGATIVE NEGATIVE Final    Comment: (NOTE) SARS-CoV-2 target nucleic acids are NOT DETECTED.  The SARS-CoV-2 RNA is generally detectable in upper respiratory specimens during the acute phase of infection. The lowest concentration of SARS-CoV-2 viral copies this assay can detect is 138 copies/mL. A negative result does not preclude SARS-Cov-2 infection and should not be used as the sole basis for treatment or other patient management decisions. A negative result may occur with  improper specimen collection/handling, submission of specimen other than nasopharyngeal swab, presence of viral mutation(s) within the areas targeted by this assay, and inadequate number of viral copies(<138 copies/mL). A negative result must be combined with clinical observations, patient history, and epidemiological information. The expected result is Negative.  Fact Sheet for Patients:  EntrepreneurPulse.com.au  Fact Sheet for Healthcare Providers:  IncredibleEmployment.be  This test is no t yet approved or cleared by the [place] FDA and  has been  authorized for detection and/or diagnosis of SARS-CoV-2 by FDA under an Emergency Use Authorization (EUA). This EUA will remain  in effect (meaning this test can be used) for the duration of the COVID-19 declaration under Section 564(b)(1) of the Act, 21 U.S.C.section 360bbb-3(b)(1), unless the authorization is terminated  or revoked sooner.       Influenza A by PCR NEGATIVE NEGATIVE Final   Influenza B by PCR NEGATIVE NEGATIVE Final    Comment: (NOTE) The Xpert Xpress SARS-CoV-2/FLU/RSV plus assay is intended as an aid in the diagnosis of influenza from Nasopharyngeal swab specimens and should not be used as a sole basis for treatment. Nasal washings and aspirates are unacceptable for Xpert Xpress SARS-CoV-2/FLU/RSV testing.  Fact Sheet for Patients: EntrepreneurPulse.com.au  Fact Sheet for Healthcare Providers: IncredibleEmployment.be  This test is not yet approved or cleared by the [place] FDA and has been authorized for detection and/or diagnosis of SARS-CoV-2 by FDA under an Emergency Use Authorization (EUA). This EUA will remain in effect (meaning this test can be used) for the duration of the COVID-19 declaration under Section 564(b)(1) of the Act, 21 U.S.C. section 360bbb-3(b)(1), unless the authorization is terminated or revoked.     Resp Syncytial Virus by PCR POSITIVE (A) NEGATIVE Final    Comment: (NOTE) Fact Sheet for Patients: EntrepreneurPulse.com.au  Fact Sheet for Healthcare Providers: IncredibleEmployment.be  This test is not yet approved or cleared by the [place] FDA and has been authorized for detection and/or diagnosis of SARS-CoV-2 by FDA under an Emergency Use Authorization (EUA). This EUA will remain in effect (meaning this test can be used) for the duration of the COVID-19 declaration under Section 564(b)(1) of the Act, 21 U.S.C. section 360bbb-3(b)(1), unless the  authorization is terminated or revoked.  Performed at [facility], [address], [place], [place] [address]   MRSA Next Gen by PCR, Nasal     Status: None   Collection Time: 11/10/22  9:15 AM   Specimen: Nasal Mucosa; Nasal Swab  Result Value Ref Range Status   MRSA by PCR Next Gen NOT DETECTED NOT DETECTED Final    Comment: (NOTE) The GeneXpert MRSA Assay (FDA approved for NASAL specimens only), is one component of a comprehensive MRSA colonization surveillance program. It is not intended to diagnose MRSA infection nor to guide or monitor treatment for MRSA infections. Test performance is not FDA approved in patients less than 59 years old. Performed at [facility], [place]  [address] [place], [place] [address]      Time coordinating discharge: 35 minutes  SIGNED:   Rodena Goldmann, DO [facility] 11/12/2022, 10:24 AM  If 7PM-7AM, please contact night-coverage [email or web address]

## 2022-11-12 NOTE — Progress Notes (Addendum)
[facility]) Hospital Liaison note:  Notified by via Milus Glazier, DO of request for [place] services. Liaison notified TOC. Will continue to follow for disposition.  Please call with any outpatient palliative questions or concerns.  Thank you for the opportunity to participate in this patient's care.  Thank you, Lorelee Market, LPN [facility] Liaison [phone]

## 2022-11-14 ENCOUNTER — Encounter: Payer: Self-pay | Admitting: *Deleted

## 2022-11-14 NOTE — Progress Notes (Signed)
Referral placed for Palliative care per Dr. Tomie China request.

## 2022-11-15 ENCOUNTER — Emergency Department (HOSPITAL_COMMUNITY)
Admission: EM | Admit: 2022-11-15 | Discharge: 2022-11-15 | Disposition: A | Discharge status: Home / Self Care | Payer: Medicare HMO | Attending: Emergency Medicine | Admitting: Emergency Medicine

## 2022-11-15 ENCOUNTER — Emergency Department (HOSPITAL_COMMUNITY): Payer: Medicare HMO

## 2022-11-15 ENCOUNTER — Other Ambulatory Visit: Payer: Self-pay

## 2022-11-15 ENCOUNTER — Encounter (HOSPITAL_COMMUNITY): Payer: Self-pay | Admitting: Emergency Medicine

## 2022-11-15 DIAGNOSIS — J9611 Chronic respiratory failure with hypoxia: Secondary | ICD-10-CM | POA: Diagnosis not present

## 2022-11-15 DIAGNOSIS — I10 Essential (primary) hypertension: Secondary | ICD-10-CM | POA: Diagnosis not present

## 2022-11-15 DIAGNOSIS — R0602 Shortness of breath: Secondary | ICD-10-CM | POA: Diagnosis present

## 2022-11-15 DIAGNOSIS — J449 Chronic obstructive pulmonary disease, unspecified: Secondary | ICD-10-CM | POA: Diagnosis not present

## 2022-11-15 DIAGNOSIS — Z7951 Long term (current) use of inhaled steroids: Secondary | ICD-10-CM | POA: Insufficient documentation

## 2022-11-15 DIAGNOSIS — Z79899 Other long term (current) drug therapy: Secondary | ICD-10-CM | POA: Insufficient documentation

## 2022-11-15 DIAGNOSIS — Z85028 Personal history of other malignant neoplasm of stomach: Secondary | ICD-10-CM | POA: Insufficient documentation

## 2022-11-15 DIAGNOSIS — R000 Tachycardia, unspecified: Secondary | ICD-10-CM | POA: Insufficient documentation

## 2022-11-15 DIAGNOSIS — Z8543 Personal history of malignant neoplasm of ovary: Secondary | ICD-10-CM | POA: Insufficient documentation

## 2022-11-15 DIAGNOSIS — J45909 Unspecified asthma, uncomplicated: Secondary | ICD-10-CM | POA: Insufficient documentation

## 2022-11-15 DIAGNOSIS — Z7982 Long term (current) use of aspirin: Secondary | ICD-10-CM | POA: Insufficient documentation

## 2022-11-15 DIAGNOSIS — Z7952 Long term (current) use of systemic steroids: Secondary | ICD-10-CM | POA: Insufficient documentation

## 2022-11-15 DIAGNOSIS — J439 Emphysema, unspecified: Secondary | ICD-10-CM | POA: Insufficient documentation

## 2022-11-15 DIAGNOSIS — Z87891 Personal history of nicotine dependence: Secondary | ICD-10-CM | POA: Diagnosis not present

## 2022-11-15 LAB — URINALYSIS, ROUTINE W REFLEX MICROSCOPIC
Bacteria, UA: NONE SEEN
Bilirubin Urine: NEGATIVE
Glucose, UA: NEGATIVE mg/dL
Ketones, ur: 20 mg/dL — AB
Nitrite: NEGATIVE
Protein, ur: 30 mg/dL — AB
Specific Gravity, Urine: 1.016 (ref 1.005–1.030)
pH: 7 (ref 5.0–8.0)

## 2022-11-15 LAB — CBC WITH DIFFERENTIAL/PLATELET
Abs Immature Granulocytes: 0.05 10*3/uL (ref 0.00–0.07)
Basophils Absolute: 0 10*3/uL (ref 0.0–0.1)
Basophils Relative: 0 %
Eosinophils Absolute: 0.1 10*3/uL (ref 0.0–0.5)
Eosinophils Relative: 1 %
HCT: 28.6 % — ABNORMAL LOW (ref 36.0–46.0)
Hemoglobin: 9.3 g/dL — ABNORMAL LOW (ref 12.0–15.0)
Immature Granulocytes: 1 %
Lymphocytes Relative: 7 %
Lymphs Abs: 0.6 10*3/uL — ABNORMAL LOW (ref 0.7–4.0)
MCH: 32.2 pg (ref 26.0–34.0)
MCHC: 32.5 g/dL (ref 30.0–36.0)
MCV: 99 fL (ref 80.0–100.0)
Monocytes Absolute: 0.6 10*3/uL (ref 0.1–1.0)
Monocytes Relative: 8 %
Neutro Abs: 6.5 10*3/uL (ref 1.7–7.7)
Neutrophils Relative %: 83 %
Platelets: 202 10*3/uL (ref 150–400)
RBC: 2.89 MIL/uL — ABNORMAL LOW (ref 3.87–5.11)
RDW: 17.6 % — ABNORMAL HIGH (ref 11.5–15.5)
WBC: 7.8 10*3/uL (ref 4.0–10.5)
nRBC: 0.4 % — ABNORMAL HIGH (ref 0.0–0.2)

## 2022-11-15 LAB — COMPREHENSIVE METABOLIC PANEL
ALT: 20 U/L (ref 0–44)
AST: 19 U/L (ref 15–41)
Albumin: 3.1 g/dL — ABNORMAL LOW (ref 3.5–5.0)
Alkaline Phosphatase: 72 U/L (ref 38–126)
Anion gap: 11 (ref 5–15)
BUN: 13 mg/dL (ref 8–23)
CO2: 29 mmol/L (ref 22–32)
Calcium: 8 mg/dL — ABNORMAL LOW (ref 8.9–10.3)
Chloride: 92 mmol/L — ABNORMAL LOW (ref 98–111)
Creatinine, Ser: 0.44 mg/dL (ref 0.44–1.00)
GFR, Estimated: >60 mL/min (ref >60)
Glucose, Bld: 98 mg/dL (ref 70–99)
Potassium: 3.4 mmol/L — ABNORMAL LOW (ref 3.5–5.1)
Sodium: 132 mmol/L — ABNORMAL LOW (ref 135–145)
Total Bilirubin: 0.8 mg/dL (ref 0.3–1.2)
Total Protein: 6 g/dL — ABNORMAL LOW (ref 6.5–8.1)

## 2022-11-15 LAB — BLOOD GAS, VENOUS
Acid-Base Excess: 6.8 mmol/L — ABNORMAL HIGH (ref 0.0–2.0)
Bicarbonate: 32.4 mmol/L — ABNORMAL HIGH (ref 20.0–28.0)
Drawn by: 6051
O2 Saturation: 88.5 %
Patient temperature: 37
pCO2, Ven: 50 mmHg (ref 44–60)
pH, Ven: 7.42 (ref 7.25–7.43)
pO2, Ven: 57 mmHg — ABNORMAL HIGH (ref 32–45)

## 2022-11-15 LAB — BRAIN NATRIURETIC PEPTIDE: B Natriuretic Peptide: 19 pg/mL (ref 0.0–100.0)

## 2022-11-15 LAB — TROPONIN I (HIGH SENSITIVITY): Troponin I (High Sensitivity): 8 ng/L (ref <18)

## 2022-11-15 MED ORDER — IOHEXOL 350 MG/ML SOLN
75.0000 mL | Freq: Once | INTRAVENOUS | Status: AC | PRN
  Administered 2022-11-15: 75 mL via INTRAVENOUS

## 2022-11-15 MED ORDER — SODIUM CHLORIDE 0.9 % IV BOLUS: 1000.0000 mL | Freq: Once | INTRAVENOUS | Status: DC

## 2022-11-15 MED ORDER — IPRATROPIUM-ALBUTEROL 0.5-2.5 (3) MG/3ML IN SOLN
3.0000 mL | Freq: Once | RESPIRATORY_TRACT | Status: AC
  Administered 2022-11-15: 3 mL via RESPIRATORY_TRACT
  Filled 2022-11-15: qty 3

## 2022-11-15 NOTE — Discharge Instructions (Signed)
Follow-up with your pulmonologist and oncologist in the next couple weeks.   If palliative care does not contact you by next Wednesday you should call your oncologist and let them know

## 2022-11-15 NOTE — Progress Notes (Signed)
Courtesy visit/PCCM service  Pt examined/ Cta reviewed   She does not have PE or pneumonia and minimal wheeze p nebs/ already on home 02 with adequate sats  Plan was to shift to palliative care per oncology and is entirely appropriate and told her frankly medical science had done it could for her and agreed with this approach.   PCCM f/u is prn   Call if questions  Christinia Gully, MD Pulmonary and [place] [phone]   After 7:00 pm call Elink  [phone]

## 2022-11-15 NOTE — ED Provider Notes (Signed)
[facility] EMERGENCY DEPARTMENT Provider Note  CSN: [id number] Arrival date & time: 11/15/22 1009  Chief Complaint(s) Weakness  HPI [name] is a 66 y.o. female with history of progressive ovarian cancer not on treatment, COPD on chronic 4 L oxygen, peripheral vascular disease, presenting to the emergency department shortness of breath.  Patient was recently admitted for community-acquired pneumonia.  She reports that her symptoms have been persistent since discharge.  She reports exertional shortness of breath as well as some shortness of breath at rest.  She continues to report dry cough, no productive cough.  No fevers or chills.  She reports compliance with her Augmentin and prednisone.  No chest pain, nausea or vomiting, abdominal pain, back pain, numbness, tingling, focal weakness.  She was referred to palliative care but have not seen them yet.   Past Medical History Past Medical History:  Diagnosis Date   Anemia    Aortic atherosclerosis (HCC)    Arthritis    Asthma    Bilateral carotid artery stenosis    Cancer ([place])    Gastric Cancer   COPD (chronic obstructive pulmonary disease) (HCC)    High cholesterol    Hyperlipidemia    Hypertension    Neuropathy    Osteoarthritis    Ovarian cancer ([place])    Oxygen dependent    Peripheral vascular disease ([place])    Peritoneal carcinoma ([place])    Pneumonia 2019   Port-A-Cath in place 07/03/2020   Pulmonary emphysema [facility])    Patient Active Problem List   Diagnosis Date Noted   Sepsis due to pneumonia ([place]) 10/29/2022   Dyslipidemia 10/29/2022   Essential tremor 10/29/2022   GERD without esophagitis 10/29/2022   Peripheral neuropathy 10/29/2022   RSV (respiratory syncytial virus pneumonia) 10/29/2022   Symptomatic anemia 10/03/2022   Macrocytic anemia 10/03/2022   Pancytopenia ([place]) 10/03/2022   Thrush 09/11/2022   Odynophagia 08/26/2022   COPD exacerbation ([place]) 07/31/2022   GERD (gastroesophageal reflux disease)  07/15/2022   ABLA (acute blood loss anemia)    Esophagitis 06/28/2022   Hematochezia 05/03/2022   E coli bacteremia 03/11/2022   Hypokalemia 03/11/2022   Hyponatremia 03/10/2022   Thrombocytopenia ([place]) 03/10/2022   Acute on chronic respiratory failure with hypoxia and hypercapnia (HCC) 12/30/2021   Deficiency anemia 05/23/2021   Peripheral neuropathy due to chemotherapy ([place]) 05/23/2021   Sepsis due to undetermined organism ([place]) 12/18/2020   Lobar pneumonia ([place]) 12/18/2020   Syncope 12/17/2020   Genetic testing 08/01/2020   Ovarian cancer ([place]) 07/07/2020   Port-A-Cath in place 07/03/2020   Family history of prostate cancer 07/03/2020   Goals of care, counseling/discussion 07/01/2020   Peritoneal carcinomatosis ([place]) 06/06/2020   Pneumonia 09/13/2018   Acute on chronic respiratory failure ([place]) 01/22/2018   CAP (community acquired pneumonia) 01/18/2016   Iron deficiency anemia 11/07/2015   Chronic respiratory failure with hypoxia ([place]) 11/07/2015   Acute on chronic respiratory failure with hypoxia ([place]) 11/07/2015   Mixed hyperlipidemia 11/07/2015   COPD GOLD 4/ 02 dep 11/06/2015   Essential hypertension 11/06/2015   Home Medication(s) Prior to Admission medications   Medication Sig Start Date End Date Taking? Authorizing Provider  albuterol (VENTOLIN HFA) 108 (90 Base) MCG/ACT inhaler Inhale 2 puffs into the lungs every 6 (six) hours as needed for wheezing or shortness of breath. 07/01/22   Roxan Hockey, MD  amLODipine (NORVASC) 5 MG tablet Take 5 mg by mouth at bedtime.     [provider]  amoxicillin-clavulanate (AUGMENTIN) 500-125  MG tablet Take 1 tablet by mouth 3 (three) times daily for 3 days. 11/12/22 11/15/22  Manuella Ghazi, Pratik D, DO  aspirin EC 81 MG tablet Take 1 tablet (81 mg total) by mouth daily with breakfast. 07/01/22   Roxan Hockey, MD  atorvastatin (LIPITOR) 40 MG tablet Take 1 tablet (40 mg total) by mouth daily. 01/02/22   Dessa Phi, DO   Budeson-Glycopyrrol-Formoterol (BREZTRI AEROSPHERE) 160-9-4.8 MCG/ACT AERO Inhale 2 puffs into the lungs 2 (two) times daily. 09/10/22   Tanda Rockers, MD  dextromethorphan-guaiFENesin (MUCINEX DM) 30-600 MG 12hr tablet Take 1 tablet by mouth 2 (two) times daily for 10 days. 11/12/22 11/22/22  Manuella Ghazi, Pratik D, DO  ferrous sulfate 325 (65 FE) MG tablet Take 325 mg by mouth every other day.    [provider]  gabapentin (NEURONTIN) 600 MG tablet Take 1 tablet by mouth 2 (two) times daily. 10/17/22   [provider]  ipratropium (ATROVENT HFA) 17 MCG/ACT inhaler Inhale 2 puffs into the lungs every 6 (six) hours as needed for wheezing.    [provider]  levalbuterol Penne Lash) 1.25 MG/3ML nebulizer solution Take 1.25 mg by nebulization in the morning, at noon, and at bedtime. 11/08/22   Johnson, Clanford L, MD  magnesium oxide (MAG-OX) 400 (240 Mg) MG tablet Take 1 tablet (400 mg total) by mouth 2 (two) times daily. 09/06/22   Derek Jack, MD  morphine (MSIR) 15 MG tablet Use 1/2-1 tab every 4 hours as needed for shortness of breath. 11/12/22   Manuella Ghazi, Pratik D, DO  nortriptyline (PAMELOR) 25 MG capsule Take 25 mg by mouth at bedtime.    [provider]  OXYGEN Inhale 3 L into the lungs continuous.    [provider]  pantoprazole (PROTONIX) 40 MG tablet Take 1 tablet (40 mg total) by mouth 2 (two) times daily. 07/01/22 11/12/22  Roxan Hockey, MD  predniSONE (DELTASONE) 10 MG tablet Take 4 tablets (40 mg total) by mouth daily for 5 days. 11/12/22 11/17/22  Heath Lark D, DO                                                                                                                                    Past Surgical History Past Surgical History:  Procedure Laterality Date   BIOPSY  06/30/2022   Procedure: BIOPSY;  Surgeon: Eloise Harman, DO;  [facility];  Service: Endoscopy;;  gastric   BIOPSY  09/03/2022   Procedure: BIOPSY;   Surgeon: Harvel Quale, MD;  Location[facility];  Service: Gastroenterology;;   COLONOSCOPY WITH PROPOFOL N/A 09/03/2022   Procedure: COLONOSCOPY WITH PROPOFOL;  Surgeon: Harvel Quale, MD;  [facility];  Service: Gastroenterology;  Laterality: N/A;  900 ASA 3   ESOPHAGEAL BRUSHING  09/03/2022   Procedure: ESOPHAGEAL BRUSHING;  Surgeon: Harvel Quale, MD;  [facility]: [facility];  Service: Gastroenterology;;  ESOPHAGOGASTRODUODENOSCOPY (EGD) WITH PROPOFOL N/A 02/17/2021   Procedure: ESOPHAGOGASTRODUODENOSCOPY (EGD) WITH PROPOFOL;  Surgeon: Harvel Quale, MD;  [facility];  Service: Gastroenterology;  Laterality: N/A;   ESOPHAGOGASTRODUODENOSCOPY (EGD) WITH PROPOFOL N/A 05/04/2022   Procedure: ESOPHAGOGASTRODUODENOSCOPY (EGD) WITH PROPOFOL;  Surgeon: Daneil Dolin, MD;  [facility];  Service: Endoscopy;  Laterality: N/A;   ESOPHAGOGASTRODUODENOSCOPY (EGD) WITH PROPOFOL N/A 06/30/2022   Procedure: ESOPHAGOGASTRODUODENOSCOPY (EGD) WITH PROPOFOL;  Surgeon: Eloise Harman, DO;  [facility];  Service: Endoscopy;  Laterality: N/A;   ESOPHAGOGASTRODUODENOSCOPY (EGD) WITH PROPOFOL N/A 09/03/2022   Procedure: ESOPHAGOGASTRODUODENOSCOPY (EGD) WITH PROPOFOL;  Surgeon: Harvel Quale, MD;  [facility];  Service: Gastroenterology;  Laterality: N/A;   HALLUX VALGUS BASE WEDGE Right 06/09/2015   Procedure: Base wedge osteotomy with modified McBride right foot ;  Surgeon: Sharlotte Alamo, MD;  Location: [facility] ORS;  Service: Podiatry;  Laterality: Right;   PORTACATH PLACEMENT Left 06/28/2020   Procedure: PORT-A-CATHETER PLACEMENT LEFT CHEST (attached catheter in left subclavian);  Surgeon: Virl Cagey, MD;  Location: [facility];  Service: General;  Laterality: Left;   TUBAL LIGATION     VIDEO BRONCHOSCOPY WITH ENDOBRONCHIAL NAVIGATION N/A 03/09/2021   Procedure: VIDEO BRONCHOSCOPY WITH  ENDOBRONCHIAL NAVIGATION;  Surgeon: Ottie Glazier, MD;  Location: [facility] OR[facility];  Service: Thoracic;  Laterality: N/A;   VIDEO BRONCHOSCOPY WITH ENDOBRONCHIAL ULTRASOUND N/A 03/09/2021   Procedure: VIDEO BRONCHOSCOPY WITH ENDOBRONCHIAL ULTRASOUND;  Surgeon: Ottie Glazier, MD;  Location: [facility] OR[facility];  Service: Thoracic;  Laterality: N/A;   Family History Family History  Problem Relation Age of Onset   Alzheimer's disease Mother    COPD Father    Emphysema Father    Hypertension Father    Healthy Sister    Healthy Brother    Alzheimer's disease Maternal Grandmother    Healthy Sister    Healthy Sister    Healthy Sister    Prostate cancer Other        paternal grandmother's brother; dx in early 91s   Breast cancer Neg Hx     Social History Social History   Tobacco Use   Smoking status: Former    Packs/day: 2.00    Years: 30.00    Total pack years: 60.00    Types: Cigarettes    Quit date: 11/04/2013    Years since quitting: 9.0   Smokeless tobacco: Never  Vaping Use   Vaping Use: Never used  Substance Use Topics   Alcohol use: No   Drug use: No   Allergies Patient has no known allergies.  Review of Systems Review of Systems  All other systems reviewed and are negative.   Physical Exam Vital Signs  I have reviewed the triage vital signs BP 92/75   Pulse 97   Temp 98.7 F (37.1 C) (Oral)   Resp 17   Ht 5' (1.524 m)   Wt 47.6 kg   SpO2 97%   BMI 20.51 kg/m  Physical Exam Vitals and nursing note reviewed.  Constitutional:      General: She is not in acute distress.    Appearance: She is well-developed.  HENT:     Head: Normocephalic and atraumatic.     Mouth/Throat:     Mouth: Mucous membranes are moist.  Eyes:     Pupils: Pupils are equal, round, and reactive to light.  Cardiovascular:     Rate and Rhythm: Normal rate and regular rhythm.     Heart sounds: No  murmur heard. Pulmonary:     Effort: Pulmonary effort is normal. No respiratory distress.      Breath sounds: Wheezing (trace diffuse wheezing, LLL crackles) present.  Abdominal:     General: Abdomen is flat.     Palpations: Abdomen is soft.     Tenderness: There is no abdominal tenderness.  Musculoskeletal:        General: No tenderness.     Right lower leg: No edema.     Left lower leg: No edema.  Skin:    General: Skin is warm and dry.  Neurological:     General: No focal deficit present.     Mental Status: She is alert. Mental status is at baseline.  Psychiatric:        Mood and Affect: Mood normal.        Behavior: Behavior normal.     ED Results and Treatments Labs (all labs ordered are listed, but only abnormal results are displayed) Labs Reviewed  COMPREHENSIVE METABOLIC PANEL - Abnormal; Notable for the following components:      Result Value   Sodium 132 (*)    Potassium 3.4 (*)    Chloride 92 (*)    Calcium 8.0 (*)    Total Protein 6.0 (*)    Albumin 3.1 (*)    All other components within normal limits  CBC WITH DIFFERENTIAL/PLATELET - Abnormal; Notable for the following components:   RBC 2.89 (*)    Hemoglobin 9.3 (*)    HCT 28.6 (*)    RDW 17.6 (*)    nRBC 0.4 (*)    Lymphs Abs 0.6 (*)    All other components within normal limits  URINALYSIS, ROUTINE W REFLEX MICROSCOPIC - Abnormal; Notable for the following components:   Hgb urine dipstick SMALL (*)    Ketones, ur 20 (*)    Protein, ur 30 (*)    Leukocytes,Ua MODERATE (*)    All other components within normal limits  BLOOD GAS, VENOUS - Abnormal; Notable for the following components:   pO2, Ven 57 (*)    Bicarbonate 32.4 (*)    Acid-Base Excess 6.8 (*)    All other components within normal limits  BRAIN NATRIURETIC PEPTIDE  TROPONIN I (HIGH SENSITIVITY)                                                                                                                          Radiology CT Angio Chest PE W and/or Wo Contrast  Result Date: 11/15/2022 CLINICAL DATA:  Recent diagnosis of RSV  with increasing weakness and shortness of breath EXAM: CT ANGIOGRAPHY CHEST WITH CONTRAST TECHNIQUE: Multidetector CT imaging of the chest was performed using the standard protocol during bolus administration of intravenous contrast. Multiplanar CT image reconstructions and MIPs were obtained to evaluate the vascular anatomy. RADIATION DOSE REDUCTION: This exam was performed according to the departmental dose-optimization program which includes automated exposure control, adjustment of the mA and/or kV according to patient size  and/or use of iterative reconstruction technique. CONTRAST:  41m OMNIPAQUE IOHEXOL 350 MG/ML SOLN COMPARISON:  CT chest dated 11/09/2022 FINDINGS: Cardiovascular: The study is high quality for the evaluation of pulmonary embolism. There are no filling defects in the central, lobar, segmental or subsegmental pulmonary artery branches to suggest acute pulmonary embolism. Great vessels are normal in course and caliber. Normal heart size. No significant pericardial fluid/thickening. Coronary artery calcifications and aortic atherosclerosis. Left chest wall port tip terminates the superior cavoatrial junction. Mediastinum/Nodes: Imaged thyroid gland without nodules meeting criteria for imaging follow-up by size. Small hiatal hernia. No pathologically enlarged axillary, supraclavicular, mediastinal, or hilar lymph nodes. Lungs/Pleura: The central airways are patent. Similar diffuse bronchial wall thickening and bronchiectasis with areas of subsegmental mucous plugging, predominantly in the left lower lobe. Severe centrilobular and paraseptal emphysema. Persistent multifocal ground-glass and tree-in-bud nodules. Decreased conspicuity of previously noted left upper lobe 6 mm nodule. No focal consolidation. No pneumothorax. No pleural effusion. Upper abdomen: Normal. Musculoskeletal:  Unchanged T8 compression deformity. Review of the MIP images confirms the above findings. IMPRESSION: 1. No evidence  of acute pulmonary embolism. 2. Persistent multifocal ground-glass and tree-in-bud nodules, consistent with atypical infection/aspiration on a background of similar diffuse bronchial wall thickening and bronchiectasis with areas of subsegmental mucous plugging, predominantly in the left lower lobe. 3. Unchanged T8 compression deformity. 4. Coronary artery calcifications. Aortic Atherosclerosis (ICD10-I70.0) and Emphysema (ICD10-J43.9). Electronically Signed   By: LDarrin NipperM.D.   On: 11/15/2022 16:16   DG Chest Port 1 View  Result Date: 11/15/2022 CLINICAL DATA:  Cough and shortness of breath. EXAM: PORTABLE CHEST 1 VIEW COMPARISON:  11/09/2022 and CT chest 11/09/2022. FINDINGS: Trachea is midline. Heart size stable. Thoracic aorta is calcified. Left sided power port tip is in the low SVC. Lungs are emphysematous. Bilateral upper lobe scarring. Probable mild bibasilar peribronchovascular prominence with subsegmental atelectasis or scarring. No pleural fluid. IMPRESSION: Mild bibasilar peribronchovascular prominence, likely due to an infectious bronchiolitis or aspiration, as on CT chest 11/09/2022. Electronically Signed   By: MLorin PicketM.D.   On: 11/15/2022 13:09    Pertinent labs & imaging results that were available during my care of the patient were reviewed by me and considered in my medical decision making (see MDM for details).  Medications Ordered in ED Medications  sodium chloride 0.9 % bolus 1,000 mL (has no administration in time range)  ipratropium-albuterol (DUONEB) 0.5-2.5 (3) MG/3ML nebulizer solution 3 mL (3 mLs Nebulization Given 11/15/22 1336)  iohexol (OMNIPAQUE) 350 MG/ML injection 75 mL (75 mLs Intravenous Contrast Given 11/15/22 1552)                                                                                                                                     Procedures Procedures  (including critical care time)  Medical Decision Making / ED  Course   MDM:  66year old with history of COPD and ovarian  cancer presenting to the emergency department with shortness of breath.  Patient chronically ill-appearing, vitals with mild tachycardia and mild tachypnea.  She does have some trace wheezing.  She does have some left lower lobe crackles, recent CT chest shows bronchiectasis in this area.  She feels mildly better after breathing treatment.  Differential includes chronic COPD, continued infection, acute COPD exacerbation, pulmonary embolism.  Chest x-ray shows essentially no significant change from previous.  Lower concern for acute infectious process as patient afebrile, no leukocytosis. CT angiography negative for pulmonary embolism.  She does not have CO2 retention.  Her pulmonologist was in the emergency department and saw her, does not think patient needs to be admitted, patient will need palliative care referral given end-stage ovarian cancer and severe COPD.  Signed out to Dr. Roderic Palau pending palliative care consult and reassessment.      Additional history obtained: -External records from outside source obtained and reviewed including: Chart review including previous notes, labs, imaging, consultation notes including outside records   Lab Tests: -I ordered, reviewed, and interpreted labs.   The pertinent results include:   Labs Reviewed  COMPREHENSIVE METABOLIC PANEL - Abnormal; Notable for the following components:      Result Value   Sodium 132 (*)    Potassium 3.4 (*)    Chloride 92 (*)    Calcium 8.0 (*)    Total Protein 6.0 (*)    Albumin 3.1 (*)    All other components within normal limits  CBC WITH DIFFERENTIAL/PLATELET - Abnormal; Notable for the following components:   RBC 2.89 (*)    Hemoglobin 9.3 (*)    HCT 28.6 (*)    RDW 17.6 (*)    nRBC 0.4 (*)    Lymphs Abs 0.6 (*)    All other components within normal limits  URINALYSIS, ROUTINE W REFLEX MICROSCOPIC - Abnormal; Notable for the following  components:   Hgb urine dipstick SMALL (*)    Ketones, ur 20 (*)    Protein, ur 30 (*)    Leukocytes,Ua MODERATE (*)    All other components within normal limits  BLOOD GAS, VENOUS - Abnormal; Notable for the following components:   pO2, Ven 57 (*)    Bicarbonate 32.4 (*)    Acid-Base Excess 6.8 (*)    All other components within normal limits  BRAIN NATRIURETIC PEPTIDE  TROPONIN I (HIGH SENSITIVITY)    Notable for mild anemia, mild chronic hypercapnea     Imaging Studies ordered: I ordered imaging studies including CXR, CTA chest On my interpretation imaging demonstrates no acute PE, chronic LLL changes I independently visualized and interpreted imaging. I agree with the radiologist interpretation   Medicines ordered and prescription drug management: Meds ordered this encounter  Medications   ipratropium-albuterol (DUONEB) 0.5-2.5 (3) MG/3ML nebulizer solution 3 mL   sodium chloride 0.9 % bolus 1,000 mL   iohexol (OMNIPAQUE) 350 MG/ML injection 75 mL    -I have reviewed the patients home medicines and have made adjustments as needed   Consultations Obtained: I requested consultation with the pulmonologist,  and discussed lab and imaging findings as well as pertinent plan - they recommend: discharge with palliative care   Cardiac Monitoring: The patient was maintained on a cardiac monitor.  I personally viewed and interpreted the cardiac monitored which showed an underlying rhythm of: sinus tachycardia  Social Determinants of Health:  Diagnosis or treatment significantly limited by social determinants of health: former smoker   Reevaluation: After the interventions noted  above, I reevaluated the patient and found that they have improved  Co morbidities that complicate the patient evaluation  Past Medical History:  Diagnosis Date   Anemia    Aortic atherosclerosis ([place])    Arthritis    Asthma    Bilateral carotid artery stenosis    Cancer ([place])    Gastric  Cancer   COPD (chronic obstructive pulmonary disease) (HCC)    High cholesterol    Hyperlipidemia    Hypertension    Neuropathy    Osteoarthritis    Ovarian cancer ([place])    Oxygen dependent    Peripheral vascular disease ([place])    Peritoneal carcinoma ([place])    Pneumonia 2019   Port-A-Cath in place 07/03/2020   Pulmonary emphysema ([place])       Dispostion: Disposition decision including need for hospitalization was considered, and patient disposition pending at sign out    Final Clinical Impression(s) / ED Diagnoses Final diagnoses:  Chronic hypoxemic respiratory failure ([place])  Chronic obstructive pulmonary disease, unspecified COPD type ([place])     This chart was dictated using voice recognition software.  Despite best efforts to proofread,  errors can occur which can change the documentation meaning.    Cristie Hem, MD 11/15/22 [phone]

## 2022-11-15 NOTE — ED Triage Notes (Signed)
Pt diagnosed with RSV and was hospital DC on 12/22. Pt has become increasingly weak and sob since discharge. Pt is on chronic 4L o2 from COPD.

## 2022-11-19 ENCOUNTER — Telehealth: Payer: Self-pay

## 2022-11-19 ENCOUNTER — Inpatient Hospital Stay: Payer: 59

## 2022-11-19 ENCOUNTER — Inpatient Hospital Stay: Payer: 59 | Attending: Hematology | Admitting: Hematology

## 2022-11-19 VITALS — BP 127/78 | HR 101 | Temp 98.6°F | Resp 18 | Ht 60.0 in | Wt 105.8 lb

## 2022-11-19 DIAGNOSIS — G629 Polyneuropathy, unspecified: Secondary | ICD-10-CM | POA: Insufficient documentation

## 2022-11-19 DIAGNOSIS — Z800 Family history of malignant neoplasm of digestive organs: Secondary | ICD-10-CM | POA: Diagnosis not present

## 2022-11-19 DIAGNOSIS — D539 Nutritional anemia, unspecified: Secondary | ICD-10-CM | POA: Diagnosis not present

## 2022-11-19 DIAGNOSIS — I10 Essential (primary) hypertension: Secondary | ICD-10-CM | POA: Insufficient documentation

## 2022-11-19 DIAGNOSIS — J449 Chronic obstructive pulmonary disease, unspecified: Secondary | ICD-10-CM | POA: Diagnosis not present

## 2022-11-19 DIAGNOSIS — Z87891 Personal history of nicotine dependence: Secondary | ICD-10-CM | POA: Diagnosis not present

## 2022-11-19 DIAGNOSIS — C569 Malignant neoplasm of unspecified ovary: Secondary | ICD-10-CM

## 2022-11-19 DIAGNOSIS — D649 Anemia, unspecified: Secondary | ICD-10-CM | POA: Diagnosis not present

## 2022-11-19 DIAGNOSIS — C786 Secondary malignant neoplasm of retroperitoneum and peritoneum: Secondary | ICD-10-CM | POA: Insufficient documentation

## 2022-11-19 DIAGNOSIS — Z8042 Family history of malignant neoplasm of prostate: Secondary | ICD-10-CM | POA: Diagnosis not present

## 2022-11-19 DIAGNOSIS — R188 Other ascites: Secondary | ICD-10-CM | POA: Diagnosis not present

## 2022-11-19 LAB — COMPREHENSIVE METABOLIC PANEL
ALT: 17 U/L (ref 0–44)
AST: 22 U/L (ref 15–41)
Albumin: 3.5 g/dL (ref 3.5–5.0)
Alkaline Phosphatase: 77 U/L (ref 38–126)
Anion gap: 12 (ref 5–15)
BUN: 8 mg/dL (ref 8–23)
CO2: 31 mmol/L (ref 22–32)
Calcium: 8.5 mg/dL — ABNORMAL LOW (ref 8.9–10.3)
Chloride: 88 mmol/L — ABNORMAL LOW (ref 98–111)
Creatinine, Ser: 0.47 mg/dL (ref 0.44–1.00)
GFR, Estimated: >60 mL/min (ref >60)
Glucose, Bld: 124 mg/dL — ABNORMAL HIGH (ref 70–99)
Potassium: 2.8 mmol/L — ABNORMAL LOW (ref 3.5–5.1)
Sodium: 131 mmol/L — ABNORMAL LOW (ref 135–145)
Total Bilirubin: 1.1 mg/dL (ref 0.3–1.2)
Total Protein: 6.6 g/dL (ref 6.5–8.1)

## 2022-11-19 LAB — CBC WITH DIFFERENTIAL/PLATELET
Abs Immature Granulocytes: 0.02 10*3/uL (ref 0.00–0.07)
Basophils Absolute: 0 10*3/uL (ref 0.0–0.1)
Basophils Relative: 0 %
Eosinophils Absolute: 0 10*3/uL (ref 0.0–0.5)
Eosinophils Relative: 1 %
HCT: 28.5 % — ABNORMAL LOW (ref 36.0–46.0)
Hemoglobin: 9.3 g/dL — ABNORMAL LOW (ref 12.0–15.0)
Immature Granulocytes: 1 %
Lymphocytes Relative: 13 %
Lymphs Abs: 0.5 10*3/uL — ABNORMAL LOW (ref 0.7–4.0)
MCH: 31.8 pg (ref 26.0–34.0)
MCHC: 32.6 g/dL (ref 30.0–36.0)
MCV: 97.6 fL (ref 80.0–100.0)
Monocytes Absolute: 0.4 10*3/uL (ref 0.1–1.0)
Monocytes Relative: 9 %
Neutro Abs: 3.2 10*3/uL (ref 1.7–7.7)
Neutrophils Relative %: 76 %
Platelets: 158 10*3/uL (ref 150–400)
RBC: 2.92 MIL/uL — ABNORMAL LOW (ref 3.87–5.11)
RDW: 16.4 % — ABNORMAL HIGH (ref 11.5–15.5)
WBC: 4.1 10*3/uL (ref 4.0–10.5)
nRBC: 0 % (ref 0.0–0.2)

## 2022-11-19 LAB — VITAMIN B12: Vitamin B-12: 219 pg/mL (ref 180–914)

## 2022-11-19 LAB — IRON AND TIBC
Iron: 60 ug/dL (ref 28–170)
Saturation Ratios: 18 % (ref 10.4–31.8)
TIBC: 332 ug/dL (ref 250–450)
UIBC: 272 ug/dL

## 2022-11-19 LAB — FOLATE: Folate: 9.8 ng/mL (ref >5.9)

## 2022-11-19 LAB — FERRITIN: Ferritin: 2376 ng/mL — ABNORMAL HIGH (ref 11–307)

## 2022-11-19 NOTE — Telephone Encounter (Signed)
1st attempt: LVM for patient to initiate palliative care services.  Awaiting return call.

## 2022-11-19 NOTE — Progress Notes (Signed)
[address] [place], [place] [address]   CLINIC:  Medical Oncology/Hematology  PCP:  [name], [place] / [place] [address] [phone]   REASON FOR VISIT:  Follow-up for high-grade serous ovarian carcinoma  PRIOR THERAPY: 1.  Carboplatin and paclitaxel  NGS Results: BRCA 1/2 negative  CURRENT THERAPY: Niraparib  BRIEF ONCOLOGIC HISTORY:  Oncology History  Peritoneal carcinomatosis ([place])  06/06/2020 Initial Diagnosis   Peritoneal carcinomatosis ([place])   07/05/2020 - 05/04/2021 Chemotherapy   Patient is on Treatment Plan : OVARIAN Carboplatin (AUC 6) / Paclitaxel (175) q21d x 6 cycles     07/22/2020 Genetic Testing   Negative genetic testing: no pathogenic variants detected in [place] Expanded CancerNext Panel + RNAInsight. The CancerNext-Expanded gene panel offered by [facility] and includes sequencing and rearrangement analysis for the following 77 genes: AIP, ALK, APC*, ATM*, AXIN2, BAP1, BARD1, BLM, BMPR1A, BRCA1*, BRCA2*, BRIP1*, CDC73, CDH1*, CDK4, CDKN1B, CDKN2A, CHEK2*, CTNNA1, DICER1, FANCC, FH, FLCN, GALNT12, KIF1B, LZTR1, MAX, MEN1, MET, MLH1*, MSH2*, MSH3, MSH6*, MUTYH*, NBN, NF1*, NF2, NTHL1, PALB2*, PHOX2B, PMS2*, POT1, PRKAR1A, PTCH1, PTEN*, RAD51C*, RAD51D*, RB1, RECQL, RET, SDHA, SDHAF2, SDHB, SDHC, SDHD, SMAD4, SMARCA4, SMARCB1, SMARCE1, STK11, SUFU, TMEM127, TP53*, TSC1, TSC2, VHL and XRCC2 (sequencing and deletion/duplication); EGFR, EGLN1, HOXB13, KIT, MITF, PDGFRA, POLD1, and POLE (sequencing only); EPCAM and GREM1 (deletion/duplication only). DNA and RNA analyses performed for * genes. The report date is July 22, 2020.    Ovarian cancer ([place])  07/07/2020 Initial Diagnosis   Ovarian cancer ([place])   05/04/2021 Imaging   1. No findings today to suggest new or progressive disease related to ovarian cancer. 2. Interval near complete resolution of the irregular and nodular areas of consolidative left upper lobe  opacity seen on the previous study. Residual architectural distortion in this region is compatible with scarring. Tiny nodules persist in the left upper lobe and attention on follow-up recommended. 3. Interval improvement in tree-in-bud nodularity anteromedial left upper lobe. Changes of bronchial wall thickening and mild cylindrical bronchiectasis in the lower lungs are similar to prior with persistent tree-in-bud nodularity posterior left lower lobe. Imaging features suggest sequelae of atypical infection. 4. Similar appearance of the omental soft tissue stranding with less prominent nodularity on today's study. 5. No ascites. 6. Mild circumferential wall thickening distal esophagus. Esophagitis could have this appearance. 7. Left colonic diverticulosis without diverticulitis. 8. Aortic Atherosclerosis (ICD10-I70.0) and Emphysema (ICD10-J43.9).   05/30/2021 -  Chemotherapy   She takes niraparib         CANCER STAGING:  Cancer Staging  No matching staging information was found for the patient.  INTERVAL HISTORY:  Ms. LOUVENIA GOLOMB, a 67 y.o. female, seen for follow-up after recent hospitalization.  She was taking Zejula 200 mg daily.  She was hospitalized from 11/16 through 10/10/2022 for pancytopenia and required blood transfusion.  Noel Journey has been stopped since then.  She reports decrease in energy levels.  Denies any bleeding per rectum or melena.  Denies any other bleeding issues.  REVIEW OF SYSTEMS:  Review of Systems  Respiratory:  Positive for cough and shortness of breath (3L).   Gastrointestinal:  Negative for diarrhea.  Skin:  Negative for rash.  Neurological:  Positive for dizziness.  All other systems reviewed and are negative.   PAST MEDICAL/SURGICAL HISTORY:  Past Medical History:  Diagnosis Date   Anemia    Aortic atherosclerosis ([place])    Arthritis    Asthma  Bilateral carotid artery stenosis    Cancer (H[facility])    Gastric Cancer   COPD (chronic obstructive  pulmonary disease) (H[facility])    High cholesterol    Hyperlipidemia    Hypertension    Neuropathy    Osteoarthritis    Ovarian cancer ([place])    Oxygen dependent    Peripheral vascular disease ([place])    Peritoneal carcinoma ([place])    Pneumonia 2019   Port-A-Cath in place 07/03/2020   Pulmonary emphysema [facility])    Past Surgical History:  Procedure Laterality Date   BIOPSY  06/30/2022   Procedure: BIOPSY;  Surgeon: Eloise Harman, DO;  [facility];  Service: Endoscopy;;  gastric   BIOPSY  09/03/2022   Procedure: BIOPSY;  Surgeon: Harvel Quale, MD;  [facility];  Service: Gastroenterology;;   COLONOSCOPY WITH PROPOFOL N/A 09/03/2022   Procedure: COLONOSCOPY WITH PROPOFOL;  Surgeon: Harvel Quale, MD;  [facility];  Service: Gastroenterology;  Laterality: N/A;  900 ASA 3   ESOPHAGEAL BRUSHING  09/03/2022   Procedure: ESOPHAGEAL BRUSHING;  Surgeon: Montez Morita, Quillian Quince, MD;  [facility];  Service: Gastroenterology;;   ESOPHAGOGASTRODUODENOSCOPY (EGD) WITH PROPOFOL N/A 02/17/2021   Procedure: ESOPHAGOGASTRODUODENOSCOPY (EGD) WITH PROPOFOL;  Surgeon: Harvel Quale, MD;  [facility];  Service: Gastroenterology;  Laterality: N/A;   ESOPHAGOGASTRODUODENOSCOPY (EGD) WITH PROPOFOL N/A 05/04/2022   Procedure: ESOPHAGOGASTRODUODENOSCOPY (EGD) WITH PROPOFOL;  Surgeon: Daneil Dolin, MD;  [facility];  Service: Endoscopy;  Laterality: N/A;   ESOPHAGOGASTRODUODENOSCOPY (EGD) WITH PROPOFOL N/A 06/30/2022   Procedure: ESOPHAGOGASTRODUODENOSCOPY (EGD) WITH PROPOFOL;  Surgeon: Eloise Harman, DO;  [facility];  Service: Endoscopy;  Laterality: N/A;   ESOPHAGOGASTRODUODENOSCOPY (EGD) WITH PROPOFOL N/A 09/03/2022   Procedure: ESOPHAGOGASTRODUODENOSCOPY (EGD) WITH PROPOFOL;  Surgeon: Harvel Quale, MD;  [facility];  Service: Gastroenterology;  Laterality: N/A;    HALLUX VALGUS BASE WEDGE Right 06/09/2015   Procedure: Base wedge osteotomy with modified McBride right foot ;  Surgeon: Sharlotte Alamo, MD;  Location: [facility];  Service: Podiatry;  Laterality: Right;   PORTACATH PLACEMENT Left 06/28/2020   Procedure: PORT-A-CATHETER PLACEMENT LEFT CHEST (attached catheter in left subclavian);  Surgeon: Virl Cagey, MD;  Location: [facility];  Service: General;  Laterality: Left;   TUBAL LIGATION     VIDEO BRONCHOSCOPY WITH ENDOBRONCHIAL NAVIGATION N/A 03/09/2021   Procedure: VIDEO BRONCHOSCOPY WITH ENDOBRONCHIAL NAVIGATION;  Surgeon: Ottie Glazier, MD;  Location: [facility];  Service: Thoracic;  Laterality: N/A;   VIDEO BRONCHOSCOPY WITH ENDOBRONCHIAL ULTRASOUND N/A 03/09/2021   Procedure: VIDEO BRONCHOSCOPY WITH ENDOBRONCHIAL ULTRASOUND;  Surgeon: Ottie Glazier, MD;  Location: [facility];  Service: Thoracic;  Laterality: N/A;    SOCIAL HISTORY:  Social History   Socioeconomic History   Marital status: Divorced    Spouse name: Not on file   Number of children: 3   Years of education: Not on file   Highest education level: Not on file  Occupational History   Occupation: DISABLED  Tobacco Use   Smoking status: Former    Packs/day: 2.00    Years: 30.00    Total pack years: 60.00    Types: Cigarettes    Quit date: 11/04/2013    Years since quitting: 9.0   Smokeless tobacco: Never  Vaping Use   Vaping Use: Never used  Substance and Sexual Activity   Alcohol use: No   Drug use: No   Sexual activity: Not  Currently  Other Topics Concern   Not on file  Social History Narrative   Not on file   Social Determinants of Health   Financial Resource Strain: Low Risk  (09/27/2020)   Overall Financial Resource Strain (CARDIA)    Difficulty of Paying Living Expenses: Not hard at all  Food Insecurity: No Food Insecurity (10/29/2022)   Hunger Vital Sign    Worried About Running Out of Food in the Last Year: Never true    Ran Out of Food in the Last Year:  Never true  Transportation Needs: No Transportation Needs (10/29/2022)   PRAPARE - Hydrologist (Medical): No    Lack of Transportation (Non-Medical): No  Physical Activity: Inactive (09/27/2020)   Exercise Vital Sign    Days of Exercise per Week: 0 days    Minutes of Exercise per Session: 0 min  Stress: No Stress Concern Present (09/27/2020)   [place]    Feeling of Stress : Not at all  Social Connections: Socially Isolated (09/27/2020)   Social Connection and Isolation Panel [NHANES]    Frequency of Communication with Friends and Family: More than three times a week    Frequency of Social Gatherings with Friends and Family: Never    Attends Religious Services: Never    Marine scientist or Organizations: No    Attends Archivist Meetings: Never    Marital Status: Divorced  Human resources officer Violence: Not At Risk (10/29/2022)   Humiliation, Afraid, Rape, and Kick questionnaire    Fear of Current or Ex-Partner: No    Emotionally Abused: No    Physically Abused: No    Sexually Abused: No    FAMILY HISTORY:  Family History  Problem Relation Age of Onset   Alzheimer's disease Mother    COPD Father    Emphysema Father    Hypertension Father    Healthy Sister    Healthy Brother    Alzheimer's disease Maternal Grandmother    Healthy Sister    Healthy Sister    Healthy Sister    Prostate cancer Other        paternal grandmother's brother; dx in early 40s   Breast cancer Neg Hx     CURRENT MEDICATIONS:  Current Outpatient Medications  Medication Sig Dispense Refill   albuterol (VENTOLIN HFA) 108 (90 Base) MCG/ACT inhaler Inhale 2 puffs into the lungs every 6 (six) hours as needed for wheezing or shortness of breath. 18 g 3   amLODipine (NORVASC) 5 MG tablet Take 5 mg by mouth at bedtime.      aspirin EC 81 MG tablet Take 1 tablet (81 mg total) by mouth daily with  breakfast. 30 tablet 11   atorvastatin (LIPITOR) 40 MG tablet Take 1 tablet (40 mg total) by mouth daily. 30 tablet 1   Budeson-Glycopyrrol-Formoterol (BREZTRI AEROSPHERE) 160-9-4.8 MCG/ACT AERO Inhale 2 puffs into the lungs 2 (two) times daily. 10.7 g 11   dextromethorphan-guaiFENesin (MUCINEX DM) 30-600 MG 12hr tablet Take 1 tablet by mouth 2 (two) times daily for 10 days. 20 tablet 0   ferrous sulfate 325 (65 FE) MG tablet Take 325 mg by mouth every other day.     gabapentin (NEURONTIN) 600 MG tablet Take 1 tablet by mouth 2 (two) times daily.     ipratropium (ATROVENT HFA) 17 MCG/ACT inhaler Inhale 2 puffs into the lungs every 6 (six) hours as needed for wheezing.  levalbuterol (XOPENEX) 1.25 MG/3ML nebulizer solution Take 1.25 mg by nebulization in the morning, at noon, and at bedtime. 72 mL 1   magnesium oxide (MAG-OX) 400 (240 Mg) MG tablet Take 1 tablet (400 mg total) by mouth 2 (two) times daily. 120 tablet 3   morphine (MSIR) 15 MG tablet Use 1/2-1 tab every 4 hours as needed for shortness of breath. 20 tablet 0   nortriptyline (PAMELOR) 25 MG capsule Take 25 mg by mouth at bedtime.     OXYGEN Inhale 3 L into the lungs continuous.     pantoprazole (PROTONIX) 40 MG tablet Take 1 tablet (40 mg total) by mouth 2 (two) times daily. 180 tablet 3   No current facility-administered medications for this visit.    ALLERGIES:  No Known Allergies  PHYSICAL EXAM:  Performance status (ECOG): 1 - Symptomatic but completely ambulatory  Vitals:   11/19/22 1413  BP: 127/78  Pulse: (!) 101  Resp: 18  Temp: 98.6 F (37 C)  SpO2: 99%    Wt Readings from Last 3 Encounters:  11/19/22 105 lb 12.8 oz (48 kg)  11/15/22 105 lb (47.6 kg)  11/10/22 110 lb 7.2 oz (50.1 kg)   Physical Exam Vitals reviewed.  Constitutional:      Appearance: Normal appearance.     Interventions: Nasal cannula in place.     Comments: In wheelchair  Cardiovascular:     Rate and Rhythm: Normal rate and regular  rhythm.     Pulses: Normal pulses.     Heart sounds: Normal heart sounds.  Pulmonary:     Effort: Pulmonary effort is normal.     Breath sounds: Normal breath sounds.     Comments: Hoarse breath sounds bilateral bases Neurological:     General: No focal deficit present.     Mental Status: She is alert and oriented to person, place, and time.  Psychiatric:        Mood and Affect: Mood normal.        Behavior: Behavior normal.     LABORATORY DATA:  I have reviewed the labs as listed.     Latest Ref Rng & Units 11/15/2022    1:10 PM 11/11/2022    4:57 AM 11/10/2022    5:07 AM  CBC  WBC 4.0 - 10.5 K/uL 7.8  6.2  7.6   Hemoglobin 12.0 - 15.0 g/dL 9.3  7.7  8.3   Hematocrit 36.0 - 46.0 % 28.6  24.4  26.9   Platelets 150 - 400 K/uL 202  202  222       Latest Ref Rng & Units 11/15/2022    1:10 PM 11/11/2022    4:57 AM 11/10/2022    5:07 AM  CMP  Glucose 70 - 99 mg/dL 98  125  130   BUN 8 - 23 mg/dL _0 Creatinine 0.44 - 1.00 mg/dL 0.44  0.48  0.40   Sodium 135 - 145 mmol/L 132  139  138   Potassium 3.5 - 5.1 mmol/L 3.4  4.4  4.2   Chloride 98 - 111 mmol/L 92  96  93   CO2 22 - 32 mmol/L 29  35  36   Calcium 8.9 - 10.3 mg/dL 8.0  8.4  8.4   Total Protein 6.5 - 8.1 g/dL 6.0     Total Bilirubin 0.3 - 1.2 mg/dL 0.8     Alkaline Phos 38 - 126 U/L 72     AST  15 - 41 U/L 19     ALT 0 - 44 U/L 20       DIAGNOSTIC IMAGING:  I have independently reviewed the scans and discussed with the patient. CT Angio Chest PE W and/or Wo Contrast  Result Date: 11/15/2022 CLINICAL DATA:  Recent diagnosis of RSV with increasing weakness and shortness of breath EXAM: CT ANGIOGRAPHY CHEST WITH CONTRAST TECHNIQUE: Multidetector CT imaging of the chest was performed using the standard protocol during bolus administration of intravenous contrast. Multiplanar CT image reconstructions and MIPs were obtained to evaluate the vascular anatomy. RADIATION DOSE REDUCTION: This exam was performed  according to the departmental dose-optimization program which includes automated exposure control, adjustment of the mA and/or kV according to patient size and/or use of iterative reconstruction technique. CONTRAST:  58m OMNIPAQUE IOHEXOL 350 MG/ML SOLN COMPARISON:  CT chest dated 11/09/2022 FINDINGS: Cardiovascular: The study is high quality for the evaluation of pulmonary embolism. There are no filling defects in the central, lobar, segmental or subsegmental pulmonary artery branches to suggest acute pulmonary embolism. Great vessels are normal in course and caliber. Normal heart size. No significant pericardial fluid/thickening. Coronary artery calcifications and aortic atherosclerosis. Left chest wall port tip terminates the superior cavoatrial junction. Mediastinum/Nodes: Imaged thyroid gland without nodules meeting criteria for imaging follow-up by size. Small hiatal hernia. No pathologically enlarged axillary, supraclavicular, mediastinal, or hilar lymph nodes. Lungs/Pleura: The central airways are patent. Similar diffuse bronchial wall thickening and bronchiectasis with areas of subsegmental mucous plugging, predominantly in the left lower lobe. Severe centrilobular and paraseptal emphysema. Persistent multifocal ground-glass and tree-in-bud nodules. Decreased conspicuity of previously noted left upper lobe 6 mm nodule. No focal consolidation. No pneumothorax. No pleural effusion. Upper abdomen: Normal. Musculoskeletal:  Unchanged T8 compression deformity. Review of the MIP images confirms the above findings. IMPRESSION: 1. No evidence of acute pulmonary embolism. 2. Persistent multifocal ground-glass and tree-in-bud nodules, consistent with atypical infection/aspiration on a background of similar diffuse bronchial wall thickening and bronchiectasis with areas of subsegmental mucous plugging, predominantly in the left lower lobe. 3. Unchanged T8 compression deformity. 4. Coronary artery calcifications.  Aortic Atherosclerosis (ICD10-I70.0) and Emphysema (ICD10-J43.9). Electronically Signed   By: LDarrin NipperM.D.   On: 11/15/2022 16:16   DG Chest Port 1 View  Result Date: 11/15/2022 CLINICAL DATA:  Cough and shortness of breath. EXAM: PORTABLE CHEST 1 VIEW COMPARISON:  11/09/2022 and CT chest 11/09/2022. FINDINGS: Trachea is midline. Heart size stable. Thoracic aorta is calcified. Left sided power port tip is in the low SVC. Lungs are emphysematous. Bilateral upper lobe scarring. Probable mild bibasilar peribronchovascular prominence with subsegmental atelectasis or scarring. No pleural fluid. IMPRESSION: Mild bibasilar peribronchovascular prominence, likely due to an infectious bronchiolitis or aspiration, as on CT chest 11/09/2022. Electronically Signed   By: MLorin PicketM.D.   On: 11/15/2022 13:09   CT Chest W Contrast  Result Date: 11/09/2022 CLINICAL DATA:  Pneumonia EXAM: CT CHEST WITH CONTRAST TECHNIQUE: Multidetector CT imaging of the chest was performed during intravenous contrast administration. RADIATION DOSE REDUCTION: This exam was performed according to the departmental dose-optimization program which includes automated exposure control, adjustment of the mA and/or kV according to patient size and/or use of iterative reconstruction technique. CONTRAST:  783mOMNIPAQUE IOHEXOL 300 MG/ML  SOLN COMPARISON:  Chest CT dated October 03, 2022 FINDINGS: Cardiovascular: Normal heart size. No pericardial effusion. Normal caliber thoracic aorta with moderate atherosclerotic disease. Moderate coronary artery calcifications. Mediastinum/Nodes: Small hiatal hernia thyroid is unremarkable. No pathologically enlarged  lymph nodes seen in the chest. Lungs/Pleura: Central airways are patent. Moderate paraseptal emphysema. Unchanged biapical scarring. Bilateral lower lung predominant cylindrical bronchiectasis and scattered endobronchial debris. Lower lung predominant centrilobular nodules, some with  tree-in-bud configuration, many of the nodules are new when compared with prior exam, others are stable. No consolidation, pleural effusion or pneumothorax. New irregular solid pulmonary nodule of the left upper lobe measuring 6 x 5 mm on series 4, image 31. Upper Abdomen: No acute abnormality. Musculoskeletal: No chest wall abnormality. No acute or significant osseous findings. IMPRESSION: 1. Lower lobe predominant cylindrical bronchiectasis, endobronchial debris, and lower lobe predominant centrilobular nodules, many of the nodules are new when compared with prior exam. Findings are likely due to airways centered infection or aspiration. 2. Largest is a new irregular solid pulmonary nodule of the left upper lobe measuring 6 mm in mean diameter. Non-contrast chest CT at 3-6 months is recommended. If the nodules are stable at time of repeat CT, then future CT at 18-24 months (from today's scan) is considered optional for low-risk patients, but is recommended for high-risk patients. This recommendation follows the consensus statement: Guidelines for Management of Incidental Pulmonary Nodules Detected on CT Images: From the Fleischner Society 2017; Radiology 2017; 284:228-243. 3. Moderate coronary artery calcifications. 4. Aortic Atherosclerosis (ICD10-I70.0) and Emphysema (ICD10-J43.9). Electronically Signed   By: Yetta Glassman M.D.   On: 11/09/2022 18:10   DG Chest Port 1 View  Result Date: 11/09/2022 CLINICAL DATA:  Shortness of breath. EXAM: PORTABLE CHEST 1 VIEW COMPARISON:  11/01/2022 FINDINGS: 1624 hours. Lungs are hyperexpanded. Interstitial markings are diffusely coarsened with chronic features. Similar appearance basilar interstitial scarring. No focal airspace consolidation or pulmonary edema. Left Port-A-Cath again noted. The cardiopericardial silhouette is within normal limits for size. IMPRESSION: Emphysema without acute cardiopulmonary findings. Electronically Signed   By: Misty Stanley M.D.    On: 11/09/2022 16:32   DG CHEST PORT 1 VIEW  Result Date: 11/01/2022 CLINICAL DATA:  Dyspnea and respiratory abnormalities EXAM: PORTABLE CHEST 1 VIEW COMPARISON:  Radiograph 10/30/2022 FINDINGS: Chest port catheter tip overlies the superior cavoatrial junction. Unchanged cardiomediastinal silhouette. Pulmonary hyperinflation. Emphysema and bronchial wall thickening. There is no focal airspace consolidation. No large pleural effusion or evidence of pneumothorax. Bones are unchanged. IMPRESSION: Findings of COPD.  No new airspace disease. Electronically Signed   By: Maurine Simmering M.D.   On: 11/01/2022 12:33   DG CHEST PORT 1 VIEW  Result Date: 10/30/2022 CLINICAL DATA:  Dyspnea EXAM: PORTABLE CHEST 1 VIEW COMPARISON:  Previous studies including the examination of 10/28/2022 FINDINGS: Cardiac size is within normal limits. Flattening of diaphragms suggests COPD. There are no signs of pulmonary edema or focal pulmonary consolidation. There is slight prominence of interstitial markings in both lungs. Focal increased markings are seen in the medial left lower lung field with no significant change. There is slight improvement in aeration in the lateral aspect of left lower lung field. There is no pleural effusion or pneumothorax. Tip of left subclavian chest port is seen in superior vena cava. IMPRESSION: COPD. There is slight prominence of interstitial markings in both lungs suggesting possible chronic interstitial lung disease with scarring. There is focal increased markings in the medial left lower lung fields suggesting interstitial pneumonia or more pronounced scarring. There is no new focal pulmonary consolidation. There are no signs of alveolar pulmonary edema. Electronically Signed   By: Elmer Picker M.D.   On: 10/30/2022 17:03   DG Chest 2 View  Result Date:  10/28/2022 CLINICAL DATA:  Shortness of breath EXAM: CHEST - 2 VIEW COMPARISON:  Previous studies including the examination of 10/07/2022  FINDINGS: Cardiac size is within normal limits. Increase in AP diameter of chest suggests COPD. Increased interstitial markings are seen in left lower lung field. There is ectasia of bronchi in the upper lung fields. Emphysematous changes are noted in the upper lung fields. There is no significant pleural effusion or pneumothorax. There is left subclavian central venous catheter with its tip in superior vena cava. IMPRESSION: COPD. Bronchiectasis. Increased interstitial markings in the left lower lung fields may suggest scarring or interstitial pneumonia superimposed over fibrosis. Electronically Signed   By: Elmer Picker M.D.   On: 10/28/2022 16:56     ASSESSMENT:  1.  Peritoneal carcinomatosis: -Presentation to the ER with right lower quadrant abdominal pain on and off for 1 month. -CTAP on 05/29/2020 showed extensive nodularity throughout the omentum and upper peritoneal cavity.  Both ovaries are prominent although no well-defined mass is noted.  Small volume ascites. -CA-125 is elevated at 2407.  CEA was 2.4. -CT of the chest shows 11 mm right paratracheal lymph node and 11 mm short axis precarinal lymph node.  Subcarinal lymphadenopathy measuring 14 mm short axis.  This is suspicious for metastatic disease. 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 -Cycle 1 of carboplatin and paclitaxel on 07/05/2020. -Evaluated by Dr. Denman George on 07/07/2020.  Reevaluation after 3 cycles. -CT CAP from 09/01/2020 showed mild improvement with reduction in the omental caking.  Stable to minimally reduced adenopathy in the chest and abdomen.  New 4 mm right upper lobe lung nodule could be inflammatory. -CT CAP on 11/07/2019 and after 6 cycles showed improved peritoneal disease/omental caking.  9 mm short axis portacaval lymph node and adjacent upper abdominal lymph  nodes measuring 12 mm in short axis.  Small retroperitoneal lymph nodes up to 5 mm improved.  Peritoneal disease/omental caking now measures 2.4 cm in thickness. -CA-125 on 10/18/2020 improved 52. -Plan for surgical resection after 6 cycles.  Unfortunately she was not cleared for surgery by anesthesia/pulmonary. - CT CAP on 02/12/2021 showed peritoneal disease and abdominal lymph nodes have improved.  However new masslike areas in the left upper lobe, new since January 31 CT scan along with borderline enlargement of the left supraclavicular lymph node. - She underwent bronchoscopy and biopsy on 03/09/2021 which was benign. - Niraparib 200 mg daily started around 05/25/2021. - Germline mutation testing was negative. - Hospitalization from 10/03/2022 through 10/10/2022 with severe thrombocytopenia, anemia and leukopenia.  She received 1 unit of PRBC and G-CSF daily.  Niraparib held around 10/07/2022.   2.  COPD: -Quit smoking 6 years ago.  Smoked 2 packs/day for 25-30 years. -Has been on 3 L/min oxygen via nasal cannula for the last 5 years.   3.  Family history: -Paternal grandmother with colon cancer.   PLAN:  1.  High-grade serous ovarian carcinoma: - Niraparib on hold since last hospitalization in November 2023. - She was hospitalized again from 11/09/2022 through 11/12/2022 with COPD exacerbation treated with antibiotics and steroids. - Her sister from [place] is present with her today and had a lot of questions.  I have answered all her questions to her satisfaction. - We have placed a referral for palliative consultation as per the request of inpatient hospitalist service for help in management of COPD.  She is not in any  chronic pain. - She has close follow-up with Dr. [name]. - Labs today showed hemoglobin is stable around 9.3.  Platelet count is normal at 158. - I have recommended CT of the abdomen and pelvis and CA125 level. - Will consider resuming niraparib at a lower dose based on the  scan findings.    2.  Normocytic anemia: - Hemoglobin is 9.3.  Will check ferritin, iron panel, E02 and folic acid.   3.  Peripheral neuropathy: - She will continue primidone.   4.  Severe COPD: - Continue close follow-ups with Dr. Marcell Anger.   5.  Hypomagnesemia: - Continue magnesium supplements twice daily.   Orders placed this encounter:  No orders of the defined types were placed in this encounter.     Derek Jack, MD [place] [phone]

## 2022-11-19 NOTE — Patient Instructions (Addendum)
[facility]  Discharge Instructions  You were seen and examined today by [name].  [name] discussed your most recent hospitalizations.  [name] recommends that you continue to hold the niraparib until he gets a CT scan on you. Stop by the lab and get your labs today.  Follow-up as scheduled after scan.    Thank you for choosing [facility] to provide your oncology and hematology care.   To afford each patient quality time with our provider, please arrive at least 15 minutes before your scheduled appointment time. You may need to reschedule your appointment if you arrive late (10 or more minutes). Arriving late affects you and other patients whose appointments are after yours.  Also, if you miss three or more appointments without notifying the office, you may be dismissed from the clinic at the provider's discretion.    Again, thank you for choosing [facility]  Our hope is that these requests will decrease the amount of time that you wait before being seen by our physicians.   If you have a lab appointment with the [facility] please come in thru the Main Entrance and check in at the main information desk.           _____________________________________________________________  Should you have questions after your visit to [facility], please contact our office at [phone] and follow the prompts.  Our office hours are 8:00 a.m. to 4:30 p.m. Monday - Thursday and 8:00 a.m. to 2:30 p.m. Friday.  Please note that voicemails left after 4:00 p.m. may not be returned until the following business day.  We are closed weekends and all major holidays.  You do have access to a nurse 24-7, just call the main number to the clinic [phone] and do not press any options, hold on the line and a nurse will answer the phone.    For prescription refill requests, have your pharmacy contact our  office and allow 72 hours.    Masks are optional in the cancer centers. If you would like for your care team to wear a mask while they are taking care of you, please let them know. You may have one support person who is at least 65 years old accompany you for your appointments.

## 2022-11-21 ENCOUNTER — Encounter: Payer: Self-pay | Admitting: Internal Medicine

## 2022-11-21 ENCOUNTER — Ambulatory Visit: Payer: Medicare HMO | Admitting: Internal Medicine

## 2022-11-21 VITALS — BP 128/64 | HR 102 | Temp 98.2°F | Ht 60.0 in | Wt 107.8 lb

## 2022-11-21 DIAGNOSIS — J449 Chronic obstructive pulmonary disease, unspecified: Secondary | ICD-10-CM

## 2022-11-21 DIAGNOSIS — J9621 Acute and chronic respiratory failure with hypoxia: Secondary | ICD-10-CM

## 2022-11-21 DIAGNOSIS — J9622 Acute and chronic respiratory failure with hypercapnia: Secondary | ICD-10-CM

## 2022-11-21 LAB — CA 125: Cancer Antigen (CA) 125: 65.5 U/mL — ABNORMAL HIGH (ref 0.0–38.1)

## 2022-11-21 NOTE — Patient Instructions (Addendum)
Plan A = Automatic = Always=    breztri Take 2 puffs first thing in am and then another 2 puffs about 12 hours later.   Work on inhaler technique:  relax and gently blow all the way out then take a nice smooth full deep breath back in, triggering the inhaler at same time you start breathing in.  Hold breath in for at least  5 seconds if you can. Blow out breztri thru nose. Rinse and gargle with water when done.  If mouth or throat bother you at all,  try brushing teeth/gums/tongue with arm and hammer toothpaste/ make a slurry and gargle and spit out.  - remember to doe practice breaths like golfers take practice swings        Plan B = Backup (to supplement plan A, not to replace it) Only use your albuterol inhaler as a rescue medication to be used if you can't catch your breath by resting or doing a relaxed purse lip breathing pattern.  - The less you use it, the better it will work when you need it. - Ok to use the inhaler up to 2 puffs  every 4 hours if you must but call for appointment if use goes up over your usual need - Don't leave home without it !!  (think of it like the spare tire for your car)   Plan C = Crisis (instead of Plan B but only if Plan B stops working) - only use your albuterol nebulizer if you first try Plan B and it fails to help > ok to use the nebulizer up to every 4 hours but if start needing it regularly call for immediate appointment   Plan D = Deltasone  If ABC not working take 20 mg prednisone until better then wean down to  5 mg daily  Make sure you check your oxygen saturation  AT  your highest level of activity (not after you stop)   to be sure it stays over 90%    For cough/ congestion > mucinex 1200 mg every 12 hours as needed and use the flutter valve as need  Make sure you check your oxygen saturation  AT  your highest level of activity (not after you stop)   to be sure it stays over 90% and adjust  02 flow upward to maintain this level if needed but  remember to turn it back to previous settings when you stop (to conserve your supply).   Please schedule a follow up office visit in 6 weeks, call sooner if needed

## 2022-11-21 NOTE — Progress Notes (Signed)
[name], female    DOB: 05/22/1956    MRN: [id number]   Brief patient profile:  52  yowf  quit smoking 2015 referred to pulmonary clinic in [place]  09/10/2022 by [facility]  for post hosp f/u    Pt of Dr Raul Del with copd GOLD 4  criteria and housbound at baseline/ 02 dep / pred dep at 5 mg with freq exac and "just calls for appt" when flares. 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    History of Present Illness  09/10/2022  Pulmonary/ 1st office eval/ Delanie Tirrell / [place] [facility] trelegy 100 and pred 5 mg  Chief Complaint  Patient presents with   Consult    HFU from [place] patient sees Pulmonary @ [facility]    Dyspnea:  room to room ok / walks to car 25 ft/ walker /02  3lpm  Cough: rattling / slt green never turned clear most the time it stays that way  Sleep: flat bed with a bunch of pillows  SABA use: avg use now is none but no feel for max / neb even less  Rec Plan A = Automatic = Always=   Breztri in place of trelegy 100  >>> Take 2 puffs first thing in am and then another 2 puffs about 12 hours later and Prednisone 5 mg   Work on inhaler technique: Plan B = Backup (to supplement plan A, not to replace it) Only use your albuterol inhaler as a rescue medication   Plan C = Crisis (instead of Plan B but only if Plan B stops working) - only use your albuterol nebulizer if you first try Plan B Plan D = Deltasone  If ABC not working take 20 mg prednisone until better then wean down to  5 mg daily  Make sure you check your oxygen saturation  AT  your highest level of activity (not after you stop)   to be sure it stays over 90%  Levaquin 500 mg daily x 7 days  For cough/ congestion > mucinex 1200 mg  every 12 hours   For thrush> clotrimazole troche 10 mg up to  4 x daily     11/19/22 onc note:  ovarian ca  - CT CAP on 02/12/2021 showed peritoneal disease and abdominal lymph nodes have improved.  However new masslike areas in the left upper lobe, new since January 31 CT scan along with borderline enlargement of the left supraclavicular lymph node. - She underwent bronchoscopy and biopsy on 03/09/2021 which was benign. - Niraparib 200 mg daily started around 05/25/2021. - Germline mutation testing was negative. - Hospitalization from 10/03/2022 through 10/10/2022 with severe thrombocytopenia, anemia and leukopenia.  She received 1 unit of PRBC and G-CSF daily.  Niraparib held around 10/07/2022 > stopped all rx at that point due to low plts     11/21/2022  f/u ov/[place] office/[facility] re: GOLD 4 / 02 dep   maint on breztri  / prednisone 5 mg floor "x years" Chief Complaint  Patient presents with   Hospitalization Follow-up    [facility] 12/11-12/22 and 12/23-12/26  COPD exacerbation using 4-5LO2 pulse/ cont 24/7   Dyspnea:  room  to room  Cough: variably congested in am / has prn flutter/ no purulent sputum Sleeping: flat bed / lots of pillows  SABA use: not using as per abc action plan  02: 4lpm 24 /7 ok to 5lpm walking  Covid status: none      No obvious day to day or daytime variability or assoc excess/ purulent sputum or mucus plugs or hemoptysis or cp or chest tightness, subjective wheeze or overt sinus or hb symptoms.   Sleeping  without nocturnal  or early am exacerbation  of respiratory  c/o's or need for noct saba. Also denies any obvious fluctuation of symptoms with weather or environmental changes or other aggravating or alleviating factors except as outlined above   No unusual exposure hx or h/o childhood pna/ asthma or knowledge of premature birth.  Current Allergies, Complete Past Medical History, Past Surgical History, Family History, and Social History were reviewed in [employer] record.  ROS  The following are not active complaints unless bolded Hoarseness, sore throat, dysphagia, dental problems, itching, sneezing,  nasal congestion or discharge of excess mucus or purulent secretions, ear ache,   fever, chills, sweats, unintended wt loss or wt gain, classically pleuritic or exertional cp,  orthopnea pnd or arm/hand swelling  or leg swelling, presyncope, palpitations, abdominal pain, anorexia, nausea, vomiting, diarrhea  or change in bowel habits or change in bladder habits, change in stools or change in urine, dysuria, hematuria,  rash, arthralgias, visual complaints, headache, numbness, weakness or ataxia or problems with walking or coordination,  change in mood or  memory.        Current Meds  Medication Sig   albuterol (VENTOLIN HFA) 108 (90 Base) MCG/ACT inhaler Inhale 2 puffs into the lungs every 6 (six) hours as needed for wheezing or shortness of breath.   amLODipine (NORVASC) 5 MG tablet Take 5 mg by mouth at bedtime.    aspirin EC 81 MG tablet Take 1 tablet (81 mg total) by mouth daily with breakfast.   atorvastatin (LIPITOR) 40 MG tablet Take 1 tablet (40 mg total) by mouth daily.   Budeson-Glycopyrrol-Formoterol (BREZTRI AEROSPHERE) 160-9-4.8 MCG/ACT AERO Inhale 2 puffs into the lungs 2 (two) times daily.   dextromethorphan-guaiFENesin (MUCINEX DM) 30-600 MG 12hr tablet Take 1 tablet by mouth 2 (two) times daily for 10 days.   ferrous sulfate 325 (65 FE) MG tablet Take 325 mg by mouth every other day.   gabapentin (NEURONTIN) 600 MG tablet Take 1 tablet by mouth 2 (two) times daily.   ipratropium (ATROVENT HFA) 17 MCG/ACT inhaler Inhale 2 puffs into the lungs every 6 (six) hours as needed for wheezing.   levalbuterol (XOPENEX) 1.25 MG/3ML nebulizer solution Take 1.25 mg by nebulization in the morning, at noon, and at bedtime.   magnesium oxide (MAG-OX) 400 (240 Mg) MG tablet Take 1 tablet (400 mg total) by mouth 2 (two) times daily.         nortriptyline (PAMELOR) 25 MG capsule Take 25 mg by mouth at bedtime.   OXYGEN Inhale 3 L into the lungs continuous.         Objective:    Wt Readings from Last 3 Encounters:  11/21/22 107 lb 12.8 oz (48.9 kg)  11/19/22 105 lb 12.8 oz (48 kg)  11/15/22 105 lb (47.6 kg)      Vital signs reviewed  11/21/2022  - Note at rest 02 sats  98% on 5lpm    General appearance:    w/c bound  elderly wf nad  slt rattle/ very poor insight into details of care esp use of 02 and inhalers nebs      HEENT :  Oropharynx  clear       NECK :  without JVD/Nodes/TM/ nl carotid upstrokes bilaterally   LUNGS: no acc muscle use,  Mod barrel  contour chest wall with bilateral  Distant bs s audible wheeze and  without cough on insp or exp maneuvers and mod  Hyperresonant  to  percussion bilaterally  - few crackles both bases    CV:  RRR  no s3 or murmur or increase in P2, and no edema   ABD:  soft and nontender w   MS:   Ext warm without deformities or   obvious joint restrictions , calf tenderness, cyanosis or clubbing  SKIN: warm and dry without lesions    NEURO:  alert, approp, nl sensorium with  no motor or cerebellar deficits apparent.          I personally reviewed images and agree with radiology impression as follows:   Chest CTa 11/15/22    1. No evidence of acute pulmonary embolism. 2. Persistent multifocal ground-glass and tree-in-bud nodules, consistent with atypical infection/aspiration on a background of similar diffuse bronchial wall thickening and bronchiectasis with areas of subsegmental mucous plugging, predominantly in the left lower lobe. 3. Unchanged T8 compression deformity.          Assessment

## 2022-11-22 ENCOUNTER — Inpatient Hospital Stay: Payer: Medicare HMO | Admitting: Internal Medicine

## 2022-11-22 NOTE — Assessment & Plan Note (Signed)
Quit smoking 2015 - pt of Dr Raul Del with copd GOLD 4  criteria and housbound at [place]/ 02 dep / pred dep at 5 mg with freq exac  - CTa  11/15/22 c/w bronchiectasis  - 11/21/2022  After extensive coaching inhaler device,  effectiveness =  75% with baseline of < 25%  and only using breztri in am > rec 2bid /flutter valve and pred ceiling of 20/ floor of 5    Group D (now reclassified as E) in terms of symptom/risk and laba/lama/ICS  therefore appropriate rx at this point >>>  breztri plus approp saba plus pred as above

## 2022-11-22 NOTE — Assessment & Plan Note (Signed)
HC03  08/15/22  = 30  - HC03  11/19/22 = 31   She is mildly hypercarbic at baseline.  Though somewhat paradoxic, when the lung fails to clear C02 properly and pC02 rises the lung then becomes a more efficient scavenger of C02 allowing lower work of breathing and  better C02 clearance albeit at a higher serum pC02 level - this is why pts can look a lot better than their ABG's would suggest and why it's so difficult to prognosticate endstage dz.  It's also why I strongly rec DNI status (ventilating pts down to a nl pC02 adversely affects this compensatory mechanism)   >>> she also has ovarian ca with limited prognosis and Dr Raliegh Ip has referred to palliative care which is fine with me esp re goals of care/ code status issues as above  F/u here in 3 m, sooner prn with emphasis on preventing recurrent admits/ ER trips discussed.         Each maintenance medication was reviewed in detail including emphasizing most importantly the difference between maintenance and prns and under what circumstances the prns are to be triggered using an action plan format where appropriate.  Total time for H and P, chart review, counseling, reviewing hfa/neb/02/flutter  device(s) and generating customized AVS unique to this office visit / same day charting = 34 min

## 2022-11-25 ENCOUNTER — Telehealth: Payer: Self-pay

## 2022-11-25 NOTE — Telephone Encounter (Signed)
2nd attempt: LVM for patient to initiate palliative care services.   Awaiting return call.

## 2022-11-27 ENCOUNTER — Ambulatory Visit (HOSPITAL_COMMUNITY)
Admission: RE | Admit: 2022-11-27 | Discharge: 2022-11-27 | Disposition: A | Discharge status: Home / Self Care | Payer: 59 | Source: Ambulatory Visit | Attending: Hematology | Admitting: Hematology

## 2022-11-27 DIAGNOSIS — K5730 Diverticulosis of large intestine without perforation or abscess without bleeding: Secondary | ICD-10-CM | POA: Diagnosis not present

## 2022-11-27 DIAGNOSIS — C569 Malignant neoplasm of unspecified ovary: Secondary | ICD-10-CM | POA: Insufficient documentation

## 2022-11-27 DIAGNOSIS — I700 Atherosclerosis of aorta: Secondary | ICD-10-CM | POA: Insufficient documentation

## 2022-11-27 DIAGNOSIS — D259 Leiomyoma of uterus, unspecified: Secondary | ICD-10-CM | POA: Insufficient documentation

## 2022-11-27 MED ORDER — IOHEXOL 350 MG/ML SOLN
75.0000 mL | Freq: Once | INTRAVENOUS | Status: AC | PRN
  Administered 2022-11-27: 75 mL via INTRAVENOUS

## 2022-11-28 ENCOUNTER — Telehealth: Payer: Self-pay

## 2022-11-28 NOTE — Telephone Encounter (Signed)
2 pm.  3rd attempt.  Phone call made to patient to follow up on Palliative Care Referral.  No answer.  Message left requesting a call back.  If no response to call, referral will be discharged due to no call back from patient.

## 2022-12-02 ENCOUNTER — Telehealth: Payer: Self-pay

## 2022-12-02 ENCOUNTER — Other Ambulatory Visit: Payer: Self-pay

## 2022-12-02 ENCOUNTER — Inpatient Hospital Stay: Payer: 59 | Admitting: Hematology

## 2022-12-02 ENCOUNTER — Other Ambulatory Visit (HOSPITAL_COMMUNITY): Payer: Self-pay

## 2022-12-02 DIAGNOSIS — C569 Malignant neoplasm of unspecified ovary: Secondary | ICD-10-CM | POA: Diagnosis not present

## 2022-12-02 DIAGNOSIS — D539 Nutritional anemia, unspecified: Secondary | ICD-10-CM

## 2022-12-02 DIAGNOSIS — E876 Hypokalemia: Secondary | ICD-10-CM

## 2022-12-02 LAB — MAGNESIUM: Magnesium: 1.8 mg/dL (ref 1.7–2.4)

## 2022-12-02 LAB — POTASSIUM: Potassium: 3.4 mmol/L — ABNORMAL LOW (ref 3.5–5.1)

## 2022-12-02 MED ORDER — SODIUM CHLORIDE 0.9% FLUSH
10.0000 mL | INTRAVENOUS | Status: DC | PRN
  Administered 2022-12-02: 10 mL via INTRAVENOUS

## 2022-12-02 MED ORDER — HEPARIN SOD (PORK) LOCK FLUSH 100 UNIT/ML IV SOLN
500.0000 [IU] | Freq: Once | INTRAVENOUS | Status: AC
  Administered 2022-12-02: 500 [IU] via INTRAVENOUS

## 2022-12-02 MED ORDER — NIRAPARIB TOSYLATE 100 MG PO TABS
100.0000 mg | ORAL_TABLET | Freq: Every day | ORAL | 4 refills | Status: DC
  Filled 2022-12-02 (×2): qty 30, 30d supply, fill #0
  Filled 2022-12-31: qty 30, 30d supply, fill #1
  Filled 2023-01-31: qty 30, 30d supply, fill #2
  Filled 2023-02-26: qty 30, 30d supply, fill #3

## 2022-12-02 NOTE — Progress Notes (Signed)
[place], [place] [id number]   CLINIC:  Medical Oncology/Hematology  PCP:  [name], [place] / [place] [address] [phone]   REASON FOR VISIT:  Follow-up for high-grade serous ovarian carcinoma  PRIOR THERAPY: 1.  Carboplatin and paclitaxel  NGS Results: BRCA 1/2 negative  CURRENT THERAPY: Niraparib  BRIEF ONCOLOGIC HISTORY:  Oncology History  Peritoneal carcinomatosis ([place])  06/06/2020 Initial Diagnosis   Peritoneal carcinomatosis ([place])   07/05/2020 - 05/04/2021 Chemotherapy   Patient is on Treatment Plan : OVARIAN Carboplatin (AUC 6) / Paclitaxel (175) q21d x 6 cycles     07/22/2020 Genetic Testing   Negative genetic testing: no pathogenic variants detected in [place] Expanded CancerNext Panel + RNAInsight. The CancerNext-Expanded gene panel offered by [facility] and includes sequencing and rearrangement analysis for the following 77 genes: AIP, ALK, APC*, ATM*, AXIN2, BAP1, BARD1, BLM, BMPR1A, BRCA1*, BRCA2*, BRIP1*, CDC73, CDH1*, CDK4, CDKN1B, CDKN2A, CHEK2*, CTNNA1, DICER1, FANCC, FH, FLCN, GALNT12, KIF1B, LZTR1, MAX, MEN1, MET, MLH1*, MSH2*, MSH3, MSH6*, MUTYH*, NBN, NF1*, NF2, NTHL1, PALB2*, PHOX2B, PMS2*, POT1, PRKAR1A, PTCH1, PTEN*, RAD51C*, RAD51D*, RB1, RECQL, RET, SDHA, SDHAF2, SDHB, SDHC, SDHD, SMAD4, SMARCA4, SMARCB1, SMARCE1, STK11, SUFU, TMEM127, TP53*, TSC1, TSC2, VHL and XRCC2 (sequencing and deletion/duplication); EGFR, EGLN1, HOXB13, KIT, MITF, PDGFRA, POLD1, and POLE (sequencing only); EPCAM and GREM1 (deletion/duplication only). DNA and RNA analyses performed for * genes. The report date is July 22, 2020.    Ovarian cancer ([place])  07/07/2020 Initial Diagnosis   Ovarian cancer ([place])   05/04/2021 Imaging   1. No findings today to suggest new or progressive disease related to ovarian cancer. 2. Interval near complete resolution of the irregular and nodular areas of consolidative left upper lobe  opacity seen on the previous study. Residual architectural distortion in this region is compatible with scarring. Tiny nodules persist in the left upper lobe and attention on follow-up recommended. 3. Interval improvement in tree-in-bud nodularity anteromedial left upper lobe. Changes of bronchial wall thickening and mild cylindrical bronchiectasis in the lower lungs are similar to prior with persistent tree-in-bud nodularity posterior left lower lobe. Imaging features suggest sequelae of atypical infection. 4. Similar appearance of the omental soft tissue stranding with less prominent nodularity on today's study. 5. No ascites. 6. Mild circumferential wall thickening distal esophagus. Esophagitis could have this appearance. 7. Left colonic diverticulosis without diverticulitis. 8. Aortic Atherosclerosis (ICD10-I70.0) and Emphysema (ICD10-J43.9).   05/30/2021 -  Chemotherapy   She takes niraparib         CANCER STAGING:  Cancer Staging  No matching staging information was found for the patient.  INTERVAL HISTORY:  [name], a 67 y.o. female, seen for follow-up of ovarian cancer.  She underwent CT scan and blood work.  She is accompanied by her sister.  She is taking magnesium twice daily.  Overall her breathing is stable.  REVIEW OF SYSTEMS:  Review of Systems  Respiratory:  Positive for cough and shortness of breath (3L).   Gastrointestinal:  Negative for diarrhea.  Skin:  Negative for rash.  Neurological:  Positive for dizziness.  All other systems reviewed and are negative.   PAST MEDICAL/SURGICAL HISTORY:  Past Medical History:  Diagnosis Date   Anemia    Aortic atherosclerosis (HCC)    Arthritis    Asthma    Bilateral carotid artery stenosis    Cancer (HCC)    Gastric Cancer   COPD (chronic obstructive pulmonary disease) ([place])  High cholesterol    Hyperlipidemia    Hypertension    Neuropathy    Osteoarthritis    Ovarian cancer ([place])    Oxygen dependent     Peripheral vascular disease ([place])    Peritoneal carcinoma ([place])    Pneumonia 2019   Port-A-Cath in place 07/03/2020   Pulmonary emphysema [facility])    Past Surgical History:  Procedure Laterality Date   BIOPSY  06/30/2022   Procedure: BIOPSY;  Surgeon: Eloise Harman, DO;  [facility];  Service: Endoscopy;;  gastric   BIOPSY  09/03/2022   Procedure: BIOPSY;  Surgeon: Harvel Quale, MD;  [facility];  Service: Gastroenterology;;   COLONOSCOPY WITH PROPOFOL N/A 09/03/2022   Procedure: COLONOSCOPY WITH PROPOFOL;  Surgeon: Harvel Quale, MD;  [facility];  Service: Gastroenterology;  Laterality: N/A;  900 ASA 3   ESOPHAGEAL BRUSHING  09/03/2022   Procedure: ESOPHAGEAL BRUSHING;  Surgeon: Montez Morita, Quillian Quince, MD;  [facility];  Service: Gastroenterology;;   ESOPHAGOGASTRODUODENOSCOPY (EGD) WITH PROPOFOL N/A 02/17/2021   Procedure: ESOPHAGOGASTRODUODENOSCOPY (EGD) WITH PROPOFOL;  Surgeon: Harvel Quale, MD;  [facility];  Service: Gastroenterology;  Laterality: N/A;   ESOPHAGOGASTRODUODENOSCOPY (EGD) WITH PROPOFOL N/A 05/04/2022   Procedure: ESOPHAGOGASTRODUODENOSCOPY (EGD) WITH PROPOFOL;  Surgeon: Daneil Dolin, MD;  [facility];  Service: Endoscopy;  Laterality: N/A;   ESOPHAGOGASTRODUODENOSCOPY (EGD) WITH PROPOFOL N/A 06/30/2022   Procedure: ESOPHAGOGASTRODUODENOSCOPY (EGD) WITH PROPOFOL;  Surgeon: Eloise Harman, DO;  [facility];  Service: Endoscopy;  Laterality: N/A;   ESOPHAGOGASTRODUODENOSCOPY (EGD) WITH PROPOFOL N/A 09/03/2022   Procedure: ESOPHAGOGASTRODUODENOSCOPY (EGD) WITH PROPOFOL;  Surgeon: Harvel Quale, MD;  [facility];  Service: Gastroenterology;  Laterality: N/A;   HALLUX VALGUS BASE WEDGE Right 06/09/2015   Procedure: Base wedge osteotomy with modified McBride right foot ;  Surgeon: Sharlotte Alamo, MD;  Location: [facility];  Service:  Podiatry;  Laterality: Right;   PORTACATH PLACEMENT Left 06/28/2020   Procedure: PORT-A-CATHETER PLACEMENT LEFT CHEST (attached catheter in left subclavian);  Surgeon: Virl Cagey, MD;  Location: [facility];  Service: General;  Laterality: Left;   TUBAL LIGATION     VIDEO BRONCHOSCOPY WITH ENDOBRONCHIAL NAVIGATION N/A 03/09/2021   Procedure: VIDEO BRONCHOSCOPY WITH ENDOBRONCHIAL NAVIGATION;  Surgeon: Ottie Glazier, MD;  Location: [facility];  Service: Thoracic;  Laterality: N/A;   VIDEO BRONCHOSCOPY WITH ENDOBRONCHIAL ULTRASOUND N/A 03/09/2021   Procedure: VIDEO BRONCHOSCOPY WITH ENDOBRONCHIAL ULTRASOUND;  Surgeon: Ottie Glazier, MD;  Location: [facility];  Service: Thoracic;  Laterality: N/A;    SOCIAL HISTORY:  Social History   Socioeconomic History   Marital status: Divorced    Spouse name: Not on file   Number of children: 3   Years of education: Not on file   Highest education level: Not on file  Occupational History   Occupation: DISABLED  Tobacco Use   Smoking status: Former    Packs/day: 2.00    Years: 30.00    Total pack years: 60.00    Types: Cigarettes    Quit date: 11/04/2013    Years since quitting: 9.0   Smokeless tobacco: Never  Vaping Use   Vaping Use: Never used  Substance and Sexual Activity   Alcohol use: No   Drug use: No   Sexual activity: Not Currently  Other Topics Concern   Not on file  Social History Narrative   Not on file   Social Determinants of Health  Financial Resource Strain: Low Risk  (09/27/2020)   Overall Financial Resource Strain (CARDIA)    Difficulty of Paying Living Expenses: Not hard at all  Food Insecurity: No Food Insecurity (10/29/2022)   Hunger Vital Sign    Worried About Running Out of Food in the Last Year: Never true    Ran Out of Food in the Last Year: Never true  Transportation Needs: No Transportation Needs (10/29/2022)   PRAPARE - Hydrologist (Medical): No    Lack of Transportation  (Non-Medical): No  Physical Activity: Inactive (09/27/2020)   Exercise Vital Sign    Days of Exercise per Week: 0 days    Minutes of Exercise per Session: 0 min  Stress: No Stress Concern Present (09/27/2020)   [place]    Feeling of Stress : Not at all  Social Connections: Socially Isolated (09/27/2020)   Social Connection and Isolation Panel [NHANES]    Frequency of Communication with Friends and Family: More than three times a week    Frequency of Social Gatherings with Friends and Family: Never    Attends Religious Services: Never    Marine scientist or Organizations: No    Attends Archivist Meetings: Never    Marital Status: Divorced  Human resources officer Violence: Not At Risk (10/29/2022)   Humiliation, Afraid, Rape, and Kick questionnaire    Fear of Current or Ex-Partner: No    Emotionally Abused: No    Physically Abused: No    Sexually Abused: No    FAMILY HISTORY:  Family History  Problem Relation Age of Onset   Alzheimer's disease Mother    COPD Father    Emphysema Father    Hypertension Father    Healthy Sister    Healthy Brother    Alzheimer's disease Maternal Grandmother    Healthy Sister    Healthy Sister    Healthy Sister    Prostate cancer Other        paternal grandmother's brother; dx in early 20s   Breast cancer Neg Hx     CURRENT MEDICATIONS:  Current Outpatient Medications  Medication Sig Dispense Refill   albuterol (VENTOLIN HFA) 108 (90 Base) MCG/ACT inhaler Inhale 2 puffs into the lungs every 6 (six) hours as needed for wheezing or shortness of breath. 18 g 3   amLODipine (NORVASC) 5 MG tablet Take 5 mg by mouth at bedtime.      aspirin EC 81 MG tablet Take 1 tablet (81 mg total) by mouth daily with breakfast. 30 tablet 11   atorvastatin (LIPITOR) 40 MG tablet Take 1 tablet (40 mg total) by mouth daily. 30 tablet 1   Budeson-Glycopyrrol-Formoterol (BREZTRI  AEROSPHERE) 160-9-4.8 MCG/ACT AERO Inhale 2 puffs into the lungs 2 (two) times daily. 10.7 g 11   ferrous sulfate 325 (65 FE) MG tablet Take 325 mg by mouth every other day.     gabapentin (NEURONTIN) 600 MG tablet Take 1 tablet by mouth 2 (two) times daily.     ipratropium (ATROVENT HFA) 17 MCG/ACT inhaler Inhale 2 puffs into the lungs every 6 (six) hours as needed for wheezing.     levalbuterol (XOPENEX) 1.25 MG/3ML nebulizer solution Take 1.25 mg by nebulization in the morning, at noon, and at bedtime. 72 mL 1   magnesium oxide (MAG-OX) 400 (240 Mg) MG tablet Take 1 tablet (400 mg total) by mouth 2 (two) times daily. 120 tablet 3   morphine (  MSIR) 15 MG tablet Use 1/2-1 tab every 4 hours as needed for shortness of breath. 20 tablet 0   nortriptyline (PAMELOR) 25 MG capsule Take 25 mg by mouth at bedtime.     OXYGEN Inhale 3 L into the lungs continuous.     pantoprazole (PROTONIX) 40 MG tablet Take 1 tablet (40 mg total) by mouth 2 (two) times daily. 180 tablet 3   No current facility-administered medications for this visit.    ALLERGIES:  No Known Allergies  PHYSICAL EXAM:  Performance status (ECOG): 1 - Symptomatic but completely ambulatory  Vitals:   12/02/22 1010  BP: 124/61  Pulse: 80  Resp: 19  Temp: 97.9 F (36.6 C)  SpO2: 100%    Wt Readings from Last 3 Encounters:  12/02/22 103 lb 14.4 oz (47.1 kg)  11/21/22 107 lb 12.8 oz (48.9 kg)  11/19/22 105 lb 12.8 oz (48 kg)   Physical Exam Vitals reviewed.  Constitutional:      Appearance: Normal appearance.     Interventions: Nasal cannula in place.     Comments: In wheelchair  Cardiovascular:     Rate and Rhythm: Normal rate and regular rhythm.     Pulses: Normal pulses.     Heart sounds: Normal heart sounds.  Pulmonary:     Effort: Pulmonary effort is normal.     Breath sounds: Normal breath sounds.     Comments: Hoarse breath sounds bilateral bases Neurological:     General: No focal deficit present.      Mental Status: She is alert and oriented to person, place, and time.  Psychiatric:        Mood and Affect: Mood normal.        Behavior: Behavior normal.      LABORATORY DATA:  I have reviewed the labs as listed.     Latest Ref Rng & Units 11/19/2022    3:23 PM 11/15/2022    1:10 PM 11/11/2022    4:57 AM  CBC  WBC 4.0 - 10.5 K/uL 4.1  7.8  6.2   Hemoglobin 12.0 - 15.0 g/dL 9.3  9.3  7.7   Hematocrit 36.0 - 46.0 % 28.5  28.6  24.4   Platelets 150 - 400 K/uL 158  202  202       Latest Ref Rng & Units 11/19/2022    3:23 PM 11/15/2022    1:10 PM 11/11/2022    4:57 AM  CMP  Glucose 70 - 99 mg/dL 124  98  125   BUN 8 - 23 mg/dL '8  13  12   '$ Creatinine 0.44 - 1.00 mg/dL 0.47  0.44  0.48   Sodium 135 - 145 mmol/L 131  132  139   Potassium 3.5 - 5.1 mmol/L 2.8  3.4  4.4   Chloride 98 - 111 mmol/L 88  92  96   CO2 22 - 32 mmol/L 31  29  35   Calcium 8.9 - 10.3 mg/dL 8.5  8.0  8.4   Total Protein 6.5 - 8.1 g/dL 6.6  6.0    Total Bilirubin 0.3 - 1.2 mg/dL 1.1  0.8    Alkaline Phos 38 - 126 U/L 77  72    AST 15 - 41 U/L 22  19    ALT 0 - 44 U/L 17  20      DIAGNOSTIC IMAGING:  I have independently reviewed the scans and discussed with the patient. CT Abdomen Pelvis W Contrast  Result Date:  11/27/2022 CLINICAL DATA:  Follow-up ovarian carcinoma. Surveillance. * Tracking Code: BO * EXAM: CT ABDOMEN AND PELVIS WITH CONTRAST TECHNIQUE: Multidetector CT imaging of the abdomen and pelvis was performed using the standard protocol following bolus administration of intravenous contrast. RADIATION DOSE REDUCTION: This exam was performed according to the departmental dose-optimization program which includes automated exposure control, adjustment of the mA and/or kV according to patient size and/or use of iterative reconstruction technique. CONTRAST:  17m OMNIPAQUE IOHEXOL 350 MG/ML SOLN COMPARISON:  06/04/2022 FINDINGS: Lower Chest: No acute findings. Hepatobiliary: No hepatic masses identified.  Tiny sub-centimeter cyst again seen in the lateral right hepatic lobe. Gallbladder is unremarkable. No evidence of biliary ductal dilatation. Pancreas:  No mass or inflammatory changes. Spleen: Within normal limits in size and appearance. Adrenals/Urinary Tract: No suspicious masses identified. No evidence of ureteral calculi or hydronephrosis. Stomach/Bowel: No evidence of obstruction, inflammatory process or abnormal fluid collections. Normal appendix visualized. Diverticulosis is seen mainly involving the descending and sigmoid colon, however there is no evidence of diverticulitis. Vascular/Lymphatic: No pathologically enlarged lymph nodes. No acute vascular findings. Aortic atherosclerotic calcification incidentally noted. Reproductive: Several uterine fibroids are again seen, largest measuring 6 cm. No other pelvic masses identified. Mild soft tissue stranding in the omental fat remains stable. No solid omental or peritoneal nodules are identified. No evidence of ascites. Other:  None. Musculoskeletal:  No suspicious bone lesions identified. IMPRESSION: Stable mild omental soft tissue stranding. No new or progressive disease within the abdomen or pelvis. Stable uterine fibroids, largest measuring 6 cm. Colonic diverticulosis, without radiographic evidence of diverticulitis. Aortic Atherosclerosis (ICD10-I70.0). Electronically Signed   By: JMarlaine HindM.D.   On: 11/27/2022 15:46   CT Angio Chest PE W and/or Wo Contrast  Result Date: 11/15/2022 CLINICAL DATA:  Recent diagnosis of RSV with increasing weakness and shortness of breath EXAM: CT ANGIOGRAPHY CHEST WITH CONTRAST TECHNIQUE: Multidetector CT imaging of the chest was performed using the standard protocol during bolus administration of intravenous contrast. Multiplanar CT image reconstructions and MIPs were obtained to evaluate the vascular anatomy. RADIATION DOSE REDUCTION: This exam was performed according to the departmental dose-optimization  program which includes automated exposure control, adjustment of the mA and/or kV according to patient size and/or use of iterative reconstruction technique. CONTRAST:  748mOMNIPAQUE IOHEXOL 350 MG/ML SOLN COMPARISON:  CT chest dated 11/09/2022 FINDINGS: Cardiovascular: The study is high quality for the evaluation of pulmonary embolism. There are no filling defects in the central, lobar, segmental or subsegmental pulmonary artery branches to suggest acute pulmonary embolism. Great vessels are normal in course and caliber. Normal heart size. No significant pericardial fluid/thickening. Coronary artery calcifications and aortic atherosclerosis. Left chest wall port tip terminates the superior cavoatrial junction. Mediastinum/Nodes: Imaged thyroid gland without nodules meeting criteria for imaging follow-up by size. Small hiatal hernia. No pathologically enlarged axillary, supraclavicular, mediastinal, or hilar lymph nodes. Lungs/Pleura: The central airways are patent. Similar diffuse bronchial wall thickening and bronchiectasis with areas of subsegmental mucous plugging, predominantly in the left lower lobe. Severe centrilobular and paraseptal emphysema. Persistent multifocal ground-glass and tree-in-bud nodules. Decreased conspicuity of previously noted left upper lobe 6 mm nodule. No focal consolidation. No pneumothorax. No pleural effusion. Upper abdomen: Normal. Musculoskeletal:  Unchanged T8 compression deformity. Review of the MIP images confirms the above findings. IMPRESSION: 1. No evidence of acute pulmonary embolism. 2. Persistent multifocal ground-glass and tree-in-bud nodules, consistent with atypical infection/aspiration on a background of similar diffuse bronchial wall thickening and bronchiectasis with areas of subsegmental  mucous plugging, predominantly in the left lower lobe. 3. Unchanged T8 compression deformity. 4. Coronary artery calcifications. Aortic Atherosclerosis (ICD10-I70.0) and Emphysema  (ICD10-J43.9). Electronically Signed   By: Darrin Nipper M.D.   On: 11/15/2022 16:16   DG Chest Port 1 View  Result Date: 11/15/2022 CLINICAL DATA:  Cough and shortness of breath. EXAM: PORTABLE CHEST 1 VIEW COMPARISON:  11/09/2022 and CT chest 11/09/2022. FINDINGS: Trachea is midline. Heart size stable. Thoracic aorta is calcified. Left sided power port tip is in the low SVC. Lungs are emphysematous. Bilateral upper lobe scarring. Probable mild bibasilar peribronchovascular prominence with subsegmental atelectasis or scarring. No pleural fluid. IMPRESSION: Mild bibasilar peribronchovascular prominence, likely due to an infectious bronchiolitis or aspiration, as on CT chest 11/09/2022. Electronically Signed   By: Lorin Picket M.D.   On: 11/15/2022 13:09   CT Chest W Contrast  Result Date: 11/09/2022 CLINICAL DATA:  Pneumonia EXAM: CT CHEST WITH CONTRAST TECHNIQUE: Multidetector CT imaging of the chest was performed during intravenous contrast administration. RADIATION DOSE REDUCTION: This exam was performed according to the departmental dose-optimization program which includes automated exposure control, adjustment of the mA and/or kV according to patient size and/or use of iterative reconstruction technique. CONTRAST:  67m OMNIPAQUE IOHEXOL 300 MG/ML  SOLN COMPARISON:  Chest CT dated October 03, 2022 FINDINGS: Cardiovascular: Normal heart size. No pericardial effusion. Normal caliber thoracic aorta with moderate atherosclerotic disease. Moderate coronary artery calcifications. Mediastinum/Nodes: Small hiatal hernia thyroid is unremarkable. No pathologically enlarged lymph nodes seen in the chest. Lungs/Pleura: Central airways are patent. Moderate paraseptal emphysema. Unchanged biapical scarring. Bilateral lower lung predominant cylindrical bronchiectasis and scattered endobronchial debris. Lower lung predominant centrilobular nodules, some with tree-in-bud configuration, many of the nodules are new when  compared with prior exam, others are stable. No consolidation, pleural effusion or pneumothorax. New irregular solid pulmonary nodule of the left upper lobe measuring 6 x 5 mm on series 4, image 31. Upper Abdomen: No acute abnormality. Musculoskeletal: No chest wall abnormality. No acute or significant osseous findings. IMPRESSION: 1. Lower lobe predominant cylindrical bronchiectasis, endobronchial debris, and lower lobe predominant centrilobular nodules, many of the nodules are new when compared with prior exam. Findings are likely due to airways centered infection or aspiration. 2. Largest is a new irregular solid pulmonary nodule of the left upper lobe measuring 6 mm in mean diameter. Non-contrast chest CT at 3-6 months is recommended. If the nodules are stable at time of repeat CT, then future CT at 18-24 months (from today's scan) is considered optional for low-risk patients, but is recommended for high-risk patients. This recommendation follows the consensus statement: Guidelines for Management of Incidental Pulmonary Nodules Detected on CT Images: From the Fleischner Society 2017; Radiology 2017; 284:228-243. 3. Moderate coronary artery calcifications. 4. Aortic Atherosclerosis (ICD10-I70.0) and Emphysema (ICD10-J43.9). Electronically Signed   By: LYetta GlassmanM.D.   On: 11/09/2022 18:10   DG Chest Port 1 View  Result Date: 11/09/2022 CLINICAL DATA:  Shortness of breath. EXAM: PORTABLE CHEST 1 VIEW COMPARISON:  11/01/2022 FINDINGS: 1624 hours. Lungs are hyperexpanded. Interstitial markings are diffusely coarsened with chronic features. Similar appearance basilar interstitial scarring. No focal airspace consolidation or pulmonary edema. Left Port-A-Cath again noted. The cardiopericardial silhouette is within normal limits for size. IMPRESSION: Emphysema without acute cardiopulmonary findings. Electronically Signed   By: EMisty StanleyM.D.   On: 11/09/2022 16:32     ASSESSMENT:  1.  Peritoneal  carcinomatosis: -Presentation to the ER with right lower quadrant abdominal pain on  and off for 1 month. -CTAP on 05/29/2020 showed extensive nodularity throughout the omentum and upper peritoneal cavity.  Both ovaries are prominent although no well-defined mass is noted.  Small volume ascites. -CA-125 is elevated at 2407.  CEA was 2.4. -CT of the chest shows 11 mm right paratracheal lymph node and 11 mm short axis precarinal lymph node.  Subcarinal lymphadenopathy measuring 14 mm short axis.  This is suspicious for metastatic disease. 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 -Cycle 1 of carboplatin and paclitaxel on 07/05/2020. -Evaluated by Dr. Denman George on 07/07/2020.  Reevaluation after 3 cycles. -CT CAP from 09/01/2020 showed mild improvement with reduction in the omental caking.  Stable to minimally reduced adenopathy in the chest and abdomen.  New 4 mm right upper lobe lung nodule could be inflammatory. -CT CAP on 11/07/2019 and after 6 cycles showed improved peritoneal disease/omental caking.  9 mm short axis portacaval lymph node and adjacent upper abdominal lymph nodes measuring 12 mm in short axis.  Small retroperitoneal lymph nodes up to 5 mm improved.  Peritoneal disease/omental caking now measures 2.4 cm in thickness. -CA-125 on 10/18/2020 improved 52. -Plan for surgical resection after 6 cycles.  Unfortunately she was not cleared for surgery by anesthesia/pulmonary. - CT CAP on 02/12/2021 showed peritoneal disease and abdominal lymph nodes have improved.  However new masslike areas in the left upper lobe, new since January 31 CT scan along with borderline enlargement of the left supraclavicular lymph node. - She underwent bronchoscopy and biopsy on 03/09/2021 which was benign. - Niraparib 200 mg daily started around 05/25/2021. - Germline  mutation testing was negative. - Hospitalization from 10/03/2022 through 10/10/2022 with severe thrombocytopenia, anemia and leukopenia.  She received 1 unit of PRBC and G-CSF daily.  Niraparib held around 10/07/2022.   2.  COPD: -Quit smoking 6 years ago.  Smoked 2 packs/day for 25-30 years. -Has been on 3 L/min oxygen via nasal cannula for the last 5 years.   3.  Family history: -Paternal grandmother with colon cancer.   PLAN:  1.  High-grade serous ovarian carcinoma: - I have reviewed labs from 11/19/2022.  CA125 has increased to 65.5. - CTAP (11/27/2022): Stable mild omental soft tissue stranding with no new or progressive disease.  Stable uterine fibroids.  Colonic diverticulosis. - As her CA125 started rising, I have recommended restarting niraparib.  Due to her recent cytopenias, I will start at a lower dose of 100 mg daily and do close follow-ups of her blood counts. - We have sent prescription to her pharmacy.  She will be back in 4 weeks for follow-up with repeat labs.    2.  Normocytic anemia: - Hemoglobin is 9.3.  Ferritin is 2376 and percent saturation 18.  Folic acid and C78 was normal.  Anemia from chronic inflammation.   3.  Peripheral neuropathy: - Continue primidone.   4.  Severe COPD: - Continue close follow-ups with Dr. [name].   5.  Hypomagnesemia: - Continue magnesium twice daily.  Magnesium is 1.8 today.  Potassium was 3.4.   Orders placed this encounter:  No orders of the defined types were placed in this encounter.     Derek Jack, MD [place] [phone]

## 2022-12-02 NOTE — Telephone Encounter (Signed)
Oral Oncology Patient Advocate Encounter  After completing a benefits investigation, prior authorization for Zeluja is not required at this time through [place].  Patient's copay is $0.00.     [name], [facility] Patient [place]  [phone] (phone) [phone] (fax) 12/02/2022 11:20 AM

## 2022-12-02 NOTE — Patient Instructions (Addendum)
[place] at [facility] Discharge Instructions   You were seen and examined today by [name].  He reviewed the results of your CT scan which was normal/stable.   He reviewed the results of your lab work which was stable. 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  We will restart you on Zejula at a lower dose (100 mg daily).     Thank you for choosing [place] at [facility] to provide your oncology and hematology care.  To afford each patient quality time with our provider, please arrive at least 15 minutes before your scheduled appointment time.   If you have a lab appointment with the [facility] please come in thru the Main Entrance and check in at the main information desk.  You need to re-schedule your appointment should you arrive 10 or more minutes late.  We strive to give you quality time with our providers, and arriving late affects you and other patients whose appointments are after yours.  Also, if you no show three or more times for appointments you may be dismissed from the clinic at the providers discretion.     Again, thank you for choosing [facility].  Our hope is that these requests will decrease the amount of time that you wait before being seen by our physicians.       _____________________________________________________________  Should you have questions after your visit to [facility], please contact our office at [phone] and follow the prompts.  Our office hours are 8:00 a.m. and 4:30 p.m. Monday - Friday.  Please note that voicemails left after 4:00 p.m. may not be returned until the following business day.  We are closed weekends and major holidays.  You do have access to a nurse 24-7, just call the main number to the clinic [phone] and do not press any options, hold on the line and a nurse will answer the phone.    For prescription refill requests, have  your pharmacy contact our office and allow 72 hours.    Due to Covid, you will need to wear a mask upon entering the hospital. If you do not have a mask, a mask will be given to you at the Main Entrance upon arrival. For doctor visits, patients may have 1 support person age 65 or older with them. For treatment visits, patients can not have anyone with them due to social distancing guidelines and our immunocompromised population.

## 2022-12-16 ENCOUNTER — Ambulatory Visit: Payer: Medicare HMO | Admitting: Internal Medicine

## 2022-12-18 ENCOUNTER — Encounter (INDEPENDENT_AMBULATORY_CARE_PROVIDER_SITE_OTHER): Payer: Self-pay | Admitting: Gastroenterology

## 2022-12-20 ENCOUNTER — Other Ambulatory Visit (HOSPITAL_COMMUNITY): Payer: Self-pay

## 2022-12-23 ENCOUNTER — Other Ambulatory Visit (HOSPITAL_COMMUNITY): Payer: Self-pay

## 2022-12-25 ENCOUNTER — Other Ambulatory Visit (HOSPITAL_COMMUNITY): Payer: Self-pay

## 2022-12-30 ENCOUNTER — Inpatient Hospital Stay: Payer: 59 | Attending: Hematology

## 2022-12-30 DIAGNOSIS — C786 Secondary malignant neoplasm of retroperitoneum and peritoneum: Secondary | ICD-10-CM | POA: Diagnosis present

## 2022-12-30 DIAGNOSIS — Z8042 Family history of malignant neoplasm of prostate: Secondary | ICD-10-CM | POA: Insufficient documentation

## 2022-12-30 DIAGNOSIS — C569 Malignant neoplasm of unspecified ovary: Secondary | ICD-10-CM | POA: Insufficient documentation

## 2022-12-30 DIAGNOSIS — D649 Anemia, unspecified: Secondary | ICD-10-CM | POA: Diagnosis not present

## 2022-12-30 DIAGNOSIS — J449 Chronic obstructive pulmonary disease, unspecified: Secondary | ICD-10-CM | POA: Insufficient documentation

## 2022-12-30 DIAGNOSIS — I10 Essential (primary) hypertension: Secondary | ICD-10-CM | POA: Diagnosis not present

## 2022-12-30 DIAGNOSIS — D539 Nutritional anemia, unspecified: Secondary | ICD-10-CM

## 2022-12-30 DIAGNOSIS — Z87891 Personal history of nicotine dependence: Secondary | ICD-10-CM | POA: Diagnosis not present

## 2022-12-30 DIAGNOSIS — E876 Hypokalemia: Secondary | ICD-10-CM | POA: Diagnosis not present

## 2022-12-30 DIAGNOSIS — G629 Polyneuropathy, unspecified: Secondary | ICD-10-CM | POA: Diagnosis not present

## 2022-12-30 LAB — CBC WITH DIFFERENTIAL/PLATELET
Abs Immature Granulocytes: 0.01 10*3/uL (ref 0.00–0.07)
Basophils Absolute: 0 10*3/uL (ref 0.0–0.1)
Basophils Relative: 1 %
Eosinophils Absolute: 0.1 10*3/uL (ref 0.0–0.5)
Eosinophils Relative: 3 %
HCT: 31.1 % — ABNORMAL LOW (ref 36.0–46.0)
Hemoglobin: 9.4 g/dL — ABNORMAL LOW (ref 12.0–15.0)
Immature Granulocytes: 0 %
Lymphocytes Relative: 39 %
Lymphs Abs: 1.5 10*3/uL (ref 0.7–4.0)
MCH: 29.7 pg (ref 26.0–34.0)
MCHC: 30.2 g/dL (ref 30.0–36.0)
MCV: 98.1 fL (ref 80.0–100.0)
Monocytes Absolute: 0.4 10*3/uL (ref 0.1–1.0)
Monocytes Relative: 10 %
Neutro Abs: 1.9 10*3/uL (ref 1.7–7.7)
Neutrophils Relative %: 47 %
Platelets: 230 10*3/uL (ref 150–400)
RBC: 3.17 MIL/uL — ABNORMAL LOW (ref 3.87–5.11)
RDW: 15.9 % — ABNORMAL HIGH (ref 11.5–15.5)
WBC: 3.9 10*3/uL — ABNORMAL LOW (ref 4.0–10.5)
nRBC: 0 % (ref 0.0–0.2)

## 2022-12-30 LAB — COMPREHENSIVE METABOLIC PANEL
ALT: 10 U/L (ref 0–44)
AST: 28 U/L (ref 15–41)
Albumin: 3.4 g/dL — ABNORMAL LOW (ref 3.5–5.0)
Alkaline Phosphatase: 84 U/L (ref 38–126)
Anion gap: 7 (ref 5–15)
BUN: 7 mg/dL — ABNORMAL LOW (ref 8–23)
CO2: 30 mmol/L (ref 22–32)
Calcium: 8.5 mg/dL — ABNORMAL LOW (ref 8.9–10.3)
Chloride: 101 mmol/L (ref 98–111)
Creatinine, Ser: 0.59 mg/dL (ref 0.44–1.00)
GFR, Estimated: >60 mL/min (ref >60)
Glucose, Bld: 105 mg/dL — ABNORMAL HIGH (ref 70–99)
Potassium: 3.1 mmol/L — ABNORMAL LOW (ref 3.5–5.1)
Sodium: 138 mmol/L (ref 135–145)
Total Bilirubin: 0.5 mg/dL (ref 0.3–1.2)
Total Protein: 6.3 g/dL — ABNORMAL LOW (ref 6.5–8.1)

## 2022-12-30 LAB — MAGNESIUM: Magnesium: 1.9 mg/dL (ref 1.7–2.4)

## 2022-12-31 ENCOUNTER — Other Ambulatory Visit (HOSPITAL_COMMUNITY): Payer: Self-pay

## 2022-12-31 ENCOUNTER — Inpatient Hospital Stay (HOSPITAL_BASED_OUTPATIENT_CLINIC_OR_DEPARTMENT_OTHER): Payer: 59 | Admitting: Hematology

## 2022-12-31 VITALS — BP 135/69 | HR 96 | Temp 98.2°F | Resp 19 | Ht 60.0 in | Wt 111.1 lb

## 2022-12-31 DIAGNOSIS — C569 Malignant neoplasm of unspecified ovary: Secondary | ICD-10-CM

## 2022-12-31 MED ORDER — POTASSIUM CHLORIDE CRYS ER 20 MEQ PO TBCR: 20.0000 meq | EXTENDED_RELEASE_TABLET | Freq: Every day | ORAL | 6 refills | Status: DC

## 2022-12-31 NOTE — Patient Instructions (Addendum)
[place]  Discharge Instructions  You were seen and examined today by [name].  [name] discussed your most recent lab work which revealed that your potassium is low.  Start taking Potassium- Kdur 20 meq once daily [name] sent this into [facility] for you.  Follow-up as scheduled in 4 weeks with labs.    Thank you for choosing [facility] to provide your oncology and hematology care.   To afford each patient quality time with our provider, please arrive at least 15 minutes before your scheduled appointment time. You may need to reschedule your appointment if you arrive late (10 or more minutes). Arriving late affects you and other patients whose appointments are after yours.  Also, if you miss three or more appointments without notifying the office, you may be dismissed from the clinic at the provider's discretion.    Again, thank you for choosing [facility].  Our hope is that these requests will decrease the amount of time that you wait before being seen by our physicians.   If you have a lab appointment with the [facility] please come in thru the Main Entrance and check in at the main information desk.           _____________________________________________________________  Should you have questions after your visit to [facility], please contact our office at [phone] and follow the prompts.  Our office hours are 8:00 a.m. to 4:30 p.m. Monday - Thursday and 8:00 a.m. to 2:30 p.m. Friday.  Please note that voicemails left after 4:00 p.m. may not be returned until the following business day.  We are closed weekends and all major holidays.  You do have access to a nurse 24-7, just call the main number to the clinic [phone] and do not press any options, hold on the line and a nurse will answer the phone.    For prescription refill requests, have your pharmacy contact our  office and allow 72 hours.    Masks are optional in the cancer centers. If you would like for your care team to wear a mask while they are taking care of you, please let them know. You may have one support person who is at least 65 years old accompany you for your appointments.

## 2022-12-31 NOTE — Progress Notes (Signed)
[name], [place] [address]    Clinic Day:  12/31/2022  Referring physician: [name], MD  Patient Care Team: Vicki Richards, MD as PCP - General (Family Medicine) Vicki Jack, MD as Medical Oncologist (Medical Oncology) Vicki Jack, MD as Consulting Physician (Hematology)   ASSESSMENT & PLAN:   Assessment: 1.  Peritoneal carcinomatosis: -Presentation to the ER with right lower quadrant abdominal pain on and off for 1 month. -CTAP on 05/29/2020 showed extensive nodularity throughout the omentum and upper peritoneal cavity.  Both ovaries are prominent although no well-defined mass is noted.  Small volume ascites. -CA-125 is elevated at 2407.  CEA was 2.4. -CT of the chest shows 11 mm right paratracheal lymph node and 11 mm short axis precarinal lymph node.  Subcarinal lymphadenopathy measuring 14 mm short axis.  This is suspicious for metastatic disease. 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 -Cycle 1 of carboplatin and paclitaxel on 07/05/2020. -Evaluated by Dr. Denman George on 07/07/2020.  Reevaluation after 3 cycles. -CT CAP from 09/01/2020 showed mild improvement with reduction in the omental caking.  Stable to minimally reduced adenopathy in the chest and abdomen.  New 4 mm right upper lobe lung nodule could be inflammatory. -CT CAP on 11/07/2019 and after 6 cycles showed improved peritoneal disease/omental caking.  9 mm short axis portacaval lymph node and adjacent upper abdominal lymph nodes measuring 12 mm in short axis.  Small retroperitoneal lymph nodes up to 5 mm improved.  Peritoneal disease/omental caking now measures 2.4 cm in thickness. -CA-125 on 10/18/2020 improved 52. -Plan for surgical resection after 6 cycles.  Unfortunately she was not cleared for surgery by  anesthesia/pulmonary. - CT CAP on 02/12/2021 showed peritoneal disease and abdominal lymph nodes have improved.  However new masslike areas in the left upper lobe, new since January 31 CT scan along with borderline enlargement of the left supraclavicular lymph node. - She underwent bronchoscopy and biopsy on 03/09/2021 which was benign. - Niraparib 200 mg daily started around 05/25/2021. - Germline mutation testing was negative. - Hospitalization from 10/03/2022 through 10/10/2022 with severe thrombocytopenia, anemia and leukopenia.  She received 1 unit of PRBC and G-CSF daily.  Niraparib held around November 2023. - Niraparib 100 mg daily started back on 12/02/2021.  Plan: 1.  High-grade serous ovarian carcinoma: - Niraparib was started at 100 mg daily on 12/02/2021. - She is tolerating it very well. - Reviewed labs today which showed normal LFTs.  CBC shows hemoglobin is stable at 9.4 and platelet count is normal at 230.  White count is mildly low at 3.9. - Denies any GI side effects.  She has gained 4 pounds since 12/02/2022. - Continue at the same dose.  RTC 4 weeks for follow-up with repeat CA125.    2.  Normocytic anemia: - Hemoglobin is 9.4.  Ferritin was 2376, percent saturation 18 and folic acid and 123456 are normal.   3.  Peripheral neuropathy: - Continue primidone.   4.  Severe COPD: - She reports dyspnea on exertion.  She has an appointment to see Dr. Melvyn Boyd soon.   5.  Hypomagnesemia/hypokalemia: - Continue magnesium twice daily.  Magnesium is normal today. - Her potassium is low last 3 times.  Will start her on potassium 20 mEq daily.    Orders Placed This Encounter  Procedures   CBC with Differential/Platelet  Standing Status:   Future    Standing Expiration Date:   01/01/2024    Order Specific Question:   Release to patient    Answer:   Immediate   Comprehensive metabolic panel    Standing Status:   Future    Standing Expiration Date:   01/01/2024    Order Specific  Question:   Release to patient    Answer:   Immediate   Ferritin    Standing Status:   Future    Standing Expiration Date:   01/01/2024    Order Specific Question:   Release to patient    Answer:   Immediate   Iron and TIBC    Standing Status:   Future    Standing Expiration Date:   01/01/2024    Order Specific Question:   Release to patient    Answer:   Immediate   CA 125    Standing Status:   Future    Standing Expiration Date:   12/31/2023      [name] as a scribe for Vicki Jack, MD.,have documented all relevant documentation on the behalf of Vicki Jack, MD,as directed by  Vicki Jack, MD while in the presence of Vicki Jack, MD.   I, Vicki Jack MD, have reviewed the above documentation for accuracy and completeness, and I agree with the above.   Vicki Jack, MD   2/13/20246:28 PM  CHIEF COMPLAINT:   Diagnosis: high-grade serous ovarian carcinoma    Cancer Staging  No matching staging information was found for the patient.   Prior Therapy: 1.  Carboplatin and paclitaxel   Current Therapy:   Niraparib    HISTORY OF PRESENT ILLNESS:   Oncology History  Peritoneal carcinomatosis ([place])  06/06/2020 Initial Diagnosis   Peritoneal carcinomatosis ([place])   07/05/2020 - 05/04/2021 Chemotherapy   Patient is on Treatment Plan : OVARIAN Carboplatin (AUC 6) / Paclitaxel (175) q21d x 6 cycles     07/22/2020 Genetic Testing   Negative genetic testing: no pathogenic variants detected in [place] Expanded CancerNext Panel + RNAInsight. The CancerNext-Expanded gene panel offered by [facility] and includes sequencing and rearrangement analysis for the following 77 genes: AIP, ALK, APC*, ATM*, AXIN2, BAP1, BARD1, BLM, BMPR1A, BRCA1*, BRCA2*, BRIP1*, CDC73, CDH1*, CDK4, CDKN1B, CDKN2A, CHEK2*, CTNNA1, DICER1, FANCC, FH, FLCN, GALNT12, KIF1B, LZTR1, MAX, MEN1, MET, MLH1*, MSH2*, MSH3, MSH6*, MUTYH*, NBN, NF1*, NF2, NTHL1,  PALB2*, PHOX2B, PMS2*, POT1, PRKAR1A, PTCH1, PTEN*, RAD51C*, RAD51D*, RB1, RECQL, RET, SDHA, SDHAF2, SDHB, SDHC, SDHD, SMAD4, SMARCA4, SMARCB1, SMARCE1, STK11, SUFU, TMEM127, TP53*, TSC1, TSC2, VHL and XRCC2 (sequencing and deletion/duplication); EGFR, EGLN1, HOXB13, KIT, MITF, PDGFRA, POLD1, and POLE (sequencing only); EPCAM and GREM1 (deletion/duplication only). DNA and RNA analyses performed for * genes. The report date is July 22, 2020.    Ovarian cancer ([place])  07/07/2020 Initial Diagnosis   Ovarian cancer ([place])   05/04/2021 Imaging   1. No findings today to suggest new or progressive disease related to ovarian cancer. 2. Interval near complete resolution of the irregular and nodular areas of consolidative left upper lobe opacity seen on the previous study. Residual architectural distortion in this region is compatible with scarring. Tiny nodules persist in the left upper lobe and attention on follow-up recommended. 3. Interval improvement in tree-in-bud nodularity anteromedial left upper lobe. Changes of bronchial wall thickening and mild cylindrical bronchiectasis in the lower lungs are similar to prior with persistent tree-in-bud nodularity posterior left lower lobe. Imaging features suggest sequelae of atypical infection. 4.  Similar appearance of the omental soft tissue stranding with less prominent nodularity on today's study. 5. No ascites. 6. Mild circumferential wall thickening distal esophagus. Esophagitis could have this appearance. 7. Left colonic diverticulosis without diverticulitis. 8. Aortic Atherosclerosis (ICD10-I70.0) and Emphysema (ICD10-J43.9).   05/30/2021 -  Chemotherapy   She takes niraparib          INTERVAL HISTORY:   Aneesha is a 67 y.o. female presenting to clinic today for follow up of high-grade serous ovarian carcinoma .She was last seen by me on 12/02/22.  Today, she states that she is doing well overall. Her appetite level is at 100%. Her energy level is  at 0%.  She has 6/10 pain on her right side. Her weight has increased by 8lbs since 12/02/2021. She has seen no problems since she started [place]. She has no energy but it decreased more since taking. She is on Breztri and she will see Dr. Christinia Gully for follow up for her shortness of breath. She is limited by her breathing, often needing to rest with walking to the bathroom in her house. She is using 3L of [place] O2 today.  She denies taking Potassium. She denies having difficulty swallowing pills. She denies any  diarrhea.   PAST MEDICAL HISTORY:   Past Medical History: Past Medical History:  Diagnosis Date   Anemia    Aortic atherosclerosis (HCC)    Arthritis    Asthma    Bilateral carotid artery stenosis    Cancer (HCC)    Gastric Cancer   COPD (chronic obstructive pulmonary disease) (HCC)    High cholesterol    Hyperlipidemia    Hypertension    Neuropathy    Osteoarthritis    Ovarian cancer ([place])    Oxygen dependent    Peripheral vascular disease ([place])    Peritoneal carcinoma ([place])    Pneumonia 2019   Port-A-Cath in place 07/03/2020   Pulmonary emphysema [facility])     Surgical History: Past Surgical History:  Procedure Laterality Date   BIOPSY  06/30/2022   Procedure: BIOPSY;  Surgeon: Eloise Harman, DO;  [facility];  Service: Endoscopy;;  gastric   BIOPSY  09/03/2022   Procedure: BIOPSY;  Surgeon: Harvel Quale, MD;  [facility];  Service: Gastroenterology;;   COLONOSCOPY WITH PROPOFOL N/A 09/03/2022   Procedure: COLONOSCOPY WITH PROPOFOL;  Surgeon: Harvel Quale, MD;  [facility];  Service: Gastroenterology;  Laterality: N/A;  900 ASA 3   ESOPHAGEAL BRUSHING  09/03/2022   Procedure: ESOPHAGEAL BRUSHING;  Surgeon: Montez Morita, Quillian Quince, MD;  [facility];  Service: Gastroenterology;;   ESOPHAGOGASTRODUODENOSCOPY (EGD) WITH PROPOFOL N/A 02/17/2021   Procedure: ESOPHAGOGASTRODUODENOSCOPY (EGD) WITH  PROPOFOL;  Surgeon: Harvel Quale, MD;  [facility];  Service: Gastroenterology;  Laterality: N/A;   ESOPHAGOGASTRODUODENOSCOPY (EGD) WITH PROPOFOL N/A 05/04/2022   Procedure: ESOPHAGOGASTRODUODENOSCOPY (EGD) WITH PROPOFOL;  Surgeon: Daneil Dolin, MD;  [facility];  Service: Endoscopy;  Laterality: N/A;   ESOPHAGOGASTRODUODENOSCOPY (EGD) WITH PROPOFOL N/A 06/30/2022   Procedure: ESOPHAGOGASTRODUODENOSCOPY (EGD) WITH PROPOFOL;  Surgeon: Eloise Harman, DO;  [facility];  Service: Endoscopy;  Laterality: N/A;   ESOPHAGOGASTRODUODENOSCOPY (EGD) WITH PROPOFOL N/A 09/03/2022   Procedure: ESOPHAGOGASTRODUODENOSCOPY (EGD) WITH PROPOFOL;  Surgeon: Harvel Quale, MD;  [facility];  Service: Gastroenterology;  Laterality: N/A;   HALLUX VALGUS BASE WEDGE Right 06/09/2015   Procedure: Base wedge osteotomy with modified McBride right foot ;  Surgeon: Sharlotte Alamo, MD;  Location: [facility];  Service: Podiatry;  Laterality: Right;   PORTACATH PLACEMENT Left 06/28/2020   Procedure: PORT-A-CATHETER PLACEMENT LEFT CHEST (attached catheter in left subclavian);  Surgeon: Virl Cagey, MD;  Location: [facility];  Service: General;  Laterality: Left;   TUBAL LIGATION     VIDEO BRONCHOSCOPY WITH ENDOBRONCHIAL NAVIGATION N/A 03/09/2021   Procedure: VIDEO BRONCHOSCOPY WITH ENDOBRONCHIAL NAVIGATION;  Surgeon: Ottie Glazier, MD;  Location: [facility];  Service: Thoracic;  Laterality: N/A;   VIDEO BRONCHOSCOPY WITH ENDOBRONCHIAL ULTRASOUND N/A 03/09/2021   Procedure: VIDEO BRONCHOSCOPY WITH ENDOBRONCHIAL ULTRASOUND;  Surgeon: Ottie Glazier, MD;  Location: [facility];  Service: Thoracic;  Laterality: N/A;    Social History: Social History   Socioeconomic History   Marital status: Divorced    Spouse name: Not on file   Number of children: 3   Years of education: Not on file   Highest education level: Not on file  Occupational History   Occupation:  DISABLED  Tobacco Use   Smoking status: Former    Packs/day: 2.00    Years: 30.00    Total pack years: 60.00    Types: Cigarettes    Quit date: 11/04/2013    Years since quitting: 9.1   Smokeless tobacco: Never  Vaping Use   Vaping Use: Never used  Substance and Sexual Activity   Alcohol use: No   Drug use: No   Sexual activity: Not Currently  Other Topics Concern   Not on file  Social History Narrative   Not on file   Social Determinants of Health   Financial Resource Strain: Low Risk  (09/27/2020)   Overall Financial Resource Strain (CARDIA)    Difficulty of Paying Living Expenses: Not hard at all  Food Insecurity: No Food Insecurity (10/29/2022)   Hunger Vital Sign    Worried About Running Out of Food in the Last Year: Never true    [place] in the Last Year: Never true  Transportation Needs: No Transportation Needs (10/29/2022)   PRAPARE - Hydrologist (Medical): No    Lack of Transportation (Non-Medical): No  Physical Activity: Inactive (09/27/2020)   Exercise Vital Sign    Days of Exercise per Week: 0 days    Minutes of Exercise per Session: 0 min  Stress: No Stress Concern Present (09/27/2020)   [place]    Feeling of Stress : Not at all  Social Connections: Socially Isolated (09/27/2020)   Social Connection and Isolation Panel [NHANES]    Frequency of Communication with Friends and Family: More than three times a week    Frequency of Social Gatherings with Friends and Family: Never    Attends Religious Services: Never    Marine scientist or Organizations: No    Attends Archivist Meetings: Never    Marital Status: Divorced  Human resources officer Violence: Not At Risk (10/29/2022)   Humiliation, Afraid, Rape, and Kick questionnaire    Fear of Current or Ex-Partner: No    Emotionally Abused: No    Physically Abused: No    Sexually Abused: No     Family History: Family History  Problem Relation Age of Onset   Alzheimer's disease Mother    COPD Father    Emphysema Father    Hypertension Father    Healthy Sister    Healthy Brother    Alzheimer's disease Maternal Grandmother    Healthy  Sister    Healthy Sister    Healthy Sister    Prostate cancer Other        paternal grandmother's brother; dx in early 75s   Breast cancer Neg Hx     Current Medications:  Current Outpatient Medications:    albuterol (VENTOLIN HFA) 108 (90 Base) MCG/ACT inhaler, Inhale 2 puffs into the lungs every 6 (six) hours as needed for wheezing or shortness of breath., Disp: 18 g, Rfl: 3   amLODipine (NORVASC) 5 MG tablet, Take 5 mg by mouth at bedtime. , Disp: , Rfl:    aspirin EC 81 MG tablet, Take 1 tablet (81 mg total) by mouth daily with breakfast., Disp: 30 tablet, Rfl: 11   atorvastatin (LIPITOR) 40 MG tablet, Take 1 tablet (40 mg total) by mouth daily., Disp: 30 tablet, Rfl: 1   Budeson-Glycopyrrol-Formoterol (BREZTRI AEROSPHERE) 160-9-4.8 MCG/ACT AERO, Inhale 2 puffs into the lungs 2 (two) times daily., Disp: 10.7 g, Rfl: 11   ferrous sulfate 325 (65 FE) MG tablet, Take 325 mg by mouth every other day., Disp: , Rfl:    gabapentin (NEURONTIN) 600 MG tablet, Take 1 tablet by mouth 2 (two) times daily., Disp: , Rfl:    ipratropium (ATROVENT HFA) 17 MCG/ACT inhaler, Inhale 2 puffs into the lungs every 6 (six) hours as needed for wheezing., Disp: , Rfl:    levalbuterol (XOPENEX) 1.25 MG/3ML nebulizer solution, Take 1.25 mg by nebulization in the morning, at noon, and at bedtime., Disp: 72 mL, Rfl: 1   magnesium oxide (MAG-OX) 400 (240 Mg) MG tablet, Take 1 tablet (400 mg total) by mouth 2 (two) times daily., Disp: 120 tablet, Rfl: 3   morphine (MSIR) 15 MG tablet, Use 1/2-1 tab every 4 hours as needed for shortness of breath., Disp: 20 tablet, Rfl: 0   niraparib tosylate (ZEJULA) 100 MG tablet, Take 1 tablet (100 mg total) by mouth daily. May take  at bedtime to reduce nausea and vomiting., Disp: 30 tablet, Rfl: 4   nortriptyline (PAMELOR) 25 MG capsule, Take 25 mg by mouth at bedtime., Disp: , Rfl:    OXYGEN, Inhale 3 L into the lungs continuous., Disp: , Rfl:    potassium chloride SA (KLOR-CON M) 20 MEQ tablet, Take 1 tablet (20 mEq total) by mouth daily., Disp: 30 tablet, Rfl: 6   pantoprazole (PROTONIX) 40 MG tablet, Take 1 tablet (40 mg total) by mouth 2 (two) times daily., Disp: 180 tablet, Rfl: 3   Allergies: No Known Allergies  REVIEW OF SYSTEMS:   Review of Systems  Constitutional:  Negative for chills, fatigue and fever.  HENT:   Negative for lump/mass, mouth sores, nosebleeds, sore throat and trouble swallowing.   Eyes:  Negative for eye problems.  Respiratory:  Positive for cough and shortness of breath (3l of o2).   Cardiovascular:  Negative for chest pain, leg swelling and palpitations.  Gastrointestinal:  Negative for abdominal pain, constipation, diarrhea, nausea and vomiting.  Genitourinary:  Negative for bladder incontinence, difficulty urinating, dysuria, frequency, hematuria and nocturia.   Musculoskeletal:  Negative for arthralgias, back pain, flank pain, myalgias and neck pain.  Skin:  Negative for itching and rash.  Neurological:  Negative for dizziness, headaches and numbness.  Hematological:  Does not bruise/bleed easily.  Psychiatric/Behavioral:  Negative for depression, sleep disturbance and suicidal ideas. The patient is not nervous/anxious.   All other systems reviewed and are negative.    VITALS:   Blood pressure 135/69, pulse 96, temperature 98.2 F (36.8  C), temperature source Tympanic, resp. rate 19, height 5' (1.524 m), weight 111 lb 1.6 oz (50.4 kg), SpO2 94 %.  Wt Readings from Last 3 Encounters:  12/31/22 111 lb 1.6 oz (50.4 kg)  12/02/22 103 lb 14.4 oz (47.1 kg)  11/21/22 107 lb 12.8 oz (48.9 kg)    Body mass index is 21.7 kg/m.  Performance status (ECOG): 1 - Symptomatic but  completely ambulatory  PHYSICAL EXAM:   Physical Exam Vitals and nursing note reviewed. Exam conducted with a chaperone present.  Constitutional:      Appearance: Normal appearance.  Cardiovascular:     Rate and Rhythm: Normal rate and regular rhythm.     Pulses: Normal pulses.     Heart sounds: Normal heart sounds.  Pulmonary:     Effort: Pulmonary effort is normal.     Breath sounds: Wheezing present.     Comments: Bilateral coarse breath sounds  Abdominal:     Palpations: Abdomen is soft. There is no hepatomegaly, splenomegaly or mass.     Tenderness: There is no abdominal tenderness.  Musculoskeletal:     Right lower leg: No edema.     Left lower leg: No edema.  Lymphadenopathy:     Cervical: No cervical adenopathy.     Right cervical: No superficial, deep or posterior cervical adenopathy.    Left cervical: No superficial, deep or posterior cervical adenopathy.     Upper Body:     Right upper body: No supraclavicular or axillary adenopathy.     Left upper body: No supraclavicular or axillary adenopathy.  Neurological:     General: No focal deficit present.     Mental Status: She is alert and oriented to person, place, and time.  Psychiatric:        Mood and Affect: Mood normal.        Behavior: Behavior normal.     LABS:      Latest Ref Rng & Units 12/30/2022   12:57 PM 11/19/2022    3:23 PM 11/15/2022    1:10 PM  CBC  WBC 4.0 - 10.5 K/uL 3.9  4.1  7.8   Hemoglobin 12.0 - 15.0 g/dL 9.4  9.3  9.3   Hematocrit 36.0 - 46.0 % 31.1  28.5  28.6   Platelets 150 - 400 K/uL 230  158  202       Latest Ref Rng & Units 12/30/2022   12:57 PM 12/02/2022   10:32 AM 11/19/2022    3:23 PM  CMP  Glucose 70 - 99 mg/dL 105   124   BUN 8 - 23 mg/dL 7   8   Creatinine 0.44 - 1.00 mg/dL 0.59   0.47   Sodium 135 - 145 mmol/L 138   131   Potassium 3.5 - 5.1 mmol/L 3.1  3.4  2.8   Chloride 98 - 111 mmol/L 101   88   CO2 22 - 32 mmol/L 30   31   Calcium 8.9 - 10.3 mg/dL 8.5   8.5    Total Protein 6.5 - 8.1 g/dL 6.3   6.6   Total Bilirubin 0.3 - 1.2 mg/dL 0.5   1.1   Alkaline Phos 38 - 126 U/L 84   77   AST 15 - 41 U/L 28   22   ALT 0 - 44 U/L 10   17      Lab Results  Component Value Date   CEA1 2.4 05/29/2020   /  CEA  Date Value Ref Range Status  05/29/2020 2.4 0.0 - 4.7 ng/mL Final    Comment:    (NOTE)                             Nonsmokers          <3.9                             Smokers             <5.6 Roche [employer]ostics Electrochemiluminescence Immunoassay (ECLIA) Values obtained with different assay methods or kits cannot be used interchangeably.  Results cannot be interpreted as absolute evidence of the presence or absence of malignant disease. Performed At: [facility] [place], [place] [id number] Rush Farmer MD [id number]    No results found for: "PSA1" Lab Results  Component Value Date   CAN199 <2 05/29/2020   Lab Results  Component Value Date   CAN125 65.5 (H) 11/19/2022    No results found for: "TOTALPROTELP", "ALBUMINELP", "A1GS", "A2GS", "BETS", "BETA2SER", "GAMS", "MSPIKE", "SPEI" Lab Results  Component Value Date   TIBC 332 11/19/2022   TIBC 315 08/15/2022   TIBC 281 04/01/2022   FERRITIN 2,376 (H) 11/19/2022   FERRITIN 761 (H) 08/15/2022   FERRITIN 667 (H) 04/01/2022   IRONPCTSAT 18 11/19/2022   IRONPCTSAT 23 08/15/2022   IRONPCTSAT 28 04/01/2022   Lab Results  Component Value Date   LDH 193 (H) 08/27/2022    STUDIES:   No results found.

## 2023-01-01 LAB — CA 125: Cancer Antigen (CA) 125: 81.5 U/mL — ABNORMAL HIGH (ref 0.0–38.1)

## 2023-01-02 ENCOUNTER — Encounter (HOSPITAL_COMMUNITY): Payer: Self-pay | Admitting: Internal Medicine

## 2023-01-02 ENCOUNTER — Emergency Department (HOSPITAL_COMMUNITY): Payer: 59

## 2023-01-02 ENCOUNTER — Ambulatory Visit: Payer: Medicare HMO | Admitting: Internal Medicine

## 2023-01-02 ENCOUNTER — Observation Stay (HOSPITAL_COMMUNITY)
Admission: EM | Admit: 2023-01-02 | Discharge: 2023-01-03 | Disposition: A | Discharge status: Home / Self Care | Payer: 59 | Attending: Internal Medicine | Admitting: Internal Medicine

## 2023-01-02 ENCOUNTER — Other Ambulatory Visit: Payer: Self-pay

## 2023-01-02 DIAGNOSIS — Z8543 Personal history of malignant neoplasm of ovary: Secondary | ICD-10-CM | POA: Diagnosis not present

## 2023-01-02 DIAGNOSIS — Z20822 Contact with and (suspected) exposure to covid-19: Secondary | ICD-10-CM | POA: Insufficient documentation

## 2023-01-02 DIAGNOSIS — Z87891 Personal history of nicotine dependence: Secondary | ICD-10-CM | POA: Diagnosis not present

## 2023-01-02 DIAGNOSIS — J441 Chronic obstructive pulmonary disease with (acute) exacerbation: Secondary | ICD-10-CM | POA: Diagnosis not present

## 2023-01-02 DIAGNOSIS — I10 Essential (primary) hypertension: Secondary | ICD-10-CM | POA: Insufficient documentation

## 2023-01-02 DIAGNOSIS — D539 Nutritional anemia, unspecified: Secondary | ICD-10-CM | POA: Diagnosis present

## 2023-01-02 DIAGNOSIS — I739 Peripheral vascular disease, unspecified: Secondary | ICD-10-CM | POA: Insufficient documentation

## 2023-01-02 DIAGNOSIS — Z79899 Other long term (current) drug therapy: Secondary | ICD-10-CM | POA: Insufficient documentation

## 2023-01-02 DIAGNOSIS — Z7982 Long term (current) use of aspirin: Secondary | ICD-10-CM | POA: Diagnosis not present

## 2023-01-02 DIAGNOSIS — E785 Hyperlipidemia, unspecified: Secondary | ICD-10-CM | POA: Diagnosis present

## 2023-01-02 DIAGNOSIS — J45909 Unspecified asthma, uncomplicated: Secondary | ICD-10-CM | POA: Diagnosis not present

## 2023-01-02 DIAGNOSIS — R0602 Shortness of breath: Secondary | ICD-10-CM | POA: Diagnosis present

## 2023-01-02 DIAGNOSIS — J9611 Chronic respiratory failure with hypoxia: Secondary | ICD-10-CM | POA: Insufficient documentation

## 2023-01-02 DIAGNOSIS — G250 Essential tremor: Secondary | ICD-10-CM | POA: Diagnosis present

## 2023-01-02 LAB — RESP PANEL BY RT-PCR (RSV, FLU A&B, COVID)  RVPGX2
Influenza A by PCR: NEGATIVE
Influenza B by PCR: NEGATIVE
Resp Syncytial Virus by PCR: NEGATIVE
SARS Coronavirus 2 by RT PCR: NEGATIVE

## 2023-01-02 LAB — BASIC METABOLIC PANEL
Anion gap: 8 (ref 5–15)
BUN: 22 mg/dL (ref 8–23)
CO2: 33 mmol/L — ABNORMAL HIGH (ref 22–32)
Calcium: 8 mg/dL — ABNORMAL LOW (ref 8.9–10.3)
Chloride: 96 mmol/L — ABNORMAL LOW (ref 98–111)
Creatinine, Ser: 0.64 mg/dL (ref 0.44–1.00)
GFR, Estimated: >60 mL/min (ref >60)
Glucose, Bld: 105 mg/dL — ABNORMAL HIGH (ref 70–99)
Potassium: 3.2 mmol/L — ABNORMAL LOW (ref 3.5–5.1)
Sodium: 137 mmol/L (ref 135–145)

## 2023-01-02 LAB — CBC WITH DIFFERENTIAL/PLATELET
Abs Immature Granulocytes: 0.03 10*3/uL (ref 0.00–0.07)
Basophils Absolute: 0 10*3/uL (ref 0.0–0.1)
Basophils Relative: 1 %
Eosinophils Absolute: 0.1 10*3/uL (ref 0.0–0.5)
Eosinophils Relative: 1 %
HCT: 27.5 % — ABNORMAL LOW (ref 36.0–46.0)
Hemoglobin: 8.5 g/dL — ABNORMAL LOW (ref 12.0–15.0)
Immature Granulocytes: 0 %
Lymphocytes Relative: 26 %
Lymphs Abs: 2 10*3/uL (ref 0.7–4.0)
MCH: 30 pg (ref 26.0–34.0)
MCHC: 30.9 g/dL (ref 30.0–36.0)
MCV: 97.2 fL (ref 80.0–100.0)
Monocytes Absolute: 0.7 10*3/uL (ref 0.1–1.0)
Monocytes Relative: 9 %
Neutro Abs: 4.8 10*3/uL (ref 1.7–7.7)
Neutrophils Relative %: 63 %
Platelets: 227 10*3/uL (ref 150–400)
RBC: 2.83 MIL/uL — ABNORMAL LOW (ref 3.87–5.11)
RDW: 16.4 % — ABNORMAL HIGH (ref 11.5–15.5)
WBC: 7.7 10*3/uL (ref 4.0–10.5)
nRBC: 0.3 % — ABNORMAL HIGH (ref 0.0–0.2)

## 2023-01-02 LAB — LACTIC ACID, PLASMA
Lactic Acid, Venous: 1.5 mmol/L (ref 0.5–1.9)
Lactic Acid, Venous: 0.9 mmol/L (ref 0.5–1.9)

## 2023-01-02 LAB — PROTIME-INR
INR: 0.9 (ref 0.8–1.2)
Prothrombin Time: 12.3 seconds (ref 11.4–15.2)

## 2023-01-02 MED ORDER — ENOXAPARIN SODIUM 40 MG/0.4ML IJ SOSY
40.0000 mg | PREFILLED_SYRINGE | INTRAMUSCULAR | Status: DC
  Administered 2023-01-02: 40 mg via SUBCUTANEOUS
  Filled 2023-01-02: qty 0.4

## 2023-01-02 MED ORDER — AMLODIPINE BESYLATE 5 MG PO TABS
5.0000 mg | ORAL_TABLET | Freq: Every day | ORAL | Status: DC
  Administered 2023-01-02: 5 mg via ORAL
  Filled 2023-01-02: qty 1

## 2023-01-02 MED ORDER — POTASSIUM CHLORIDE CRYS ER 20 MEQ PO TBCR
40.0000 meq | EXTENDED_RELEASE_TABLET | Freq: Once | ORAL | Status: AC
  Administered 2023-01-02: 40 meq via ORAL
  Filled 2023-01-02: qty 2

## 2023-01-02 MED ORDER — FERROUS SULFATE 325 (65 FE) MG PO TABS
325.0000 mg | ORAL_TABLET | ORAL | Status: DC
  Administered 2023-01-03: 325 mg via ORAL
  Filled 2023-01-02: qty 1

## 2023-01-02 MED ORDER — ACETAMINOPHEN 650 MG RE SUPP: 650.0000 mg | Freq: Four times a day (QID) | RECTAL | Status: DC | PRN

## 2023-01-02 MED ORDER — METHYLPREDNISOLONE SODIUM SUCC 125 MG IJ SOLR
125.0000 mg | Freq: Once | INTRAMUSCULAR | Status: AC
  Administered 2023-01-02: 125 mg via INTRAVENOUS
  Filled 2023-01-02: qty 2

## 2023-01-02 MED ORDER — SODIUM CHLORIDE 0.9% FLUSH
3.0000 mL | Freq: Two times a day (BID) | INTRAVENOUS | Status: DC
  Administered 2023-01-02 – 2023-01-03 (×2): 3 mL via INTRAVENOUS

## 2023-01-02 MED ORDER — IPRATROPIUM-ALBUTEROL 0.5-2.5 (3) MG/3ML IN SOLN: 3.0000 mL | Freq: Four times a day (QID) | RESPIRATORY_TRACT | Status: DC

## 2023-01-02 MED ORDER — ACETAMINOPHEN 325 MG PO TABS: 650.0000 mg | ORAL_TABLET | Freq: Four times a day (QID) | ORAL | Status: DC | PRN

## 2023-01-02 MED ORDER — ONDANSETRON HCL 4 MG/2ML IJ SOLN: 4.0000 mg | Freq: Four times a day (QID) | INTRAMUSCULAR | Status: DC | PRN

## 2023-01-02 MED ORDER — IPRATROPIUM-ALBUTEROL 0.5-2.5 (3) MG/3ML IN SOLN
3.0000 mL | Freq: Four times a day (QID) | RESPIRATORY_TRACT | Status: DC
  Administered 2023-01-02 – 2023-01-03 (×3): 3 mL via RESPIRATORY_TRACT
  Filled 2023-01-02 (×3): qty 3

## 2023-01-02 MED ORDER — METHYLPREDNISOLONE SODIUM SUCC 40 MG IJ SOLR
40.0000 mg | Freq: Two times a day (BID) | INTRAMUSCULAR | Status: DC
  Administered 2023-01-02 – 2023-01-03 (×2): 40 mg via INTRAVENOUS
  Filled 2023-01-02 (×2): qty 1

## 2023-01-02 MED ORDER — MAGNESIUM SULFATE 2 GM/50ML IV SOLN
2.0000 g | INTRAVENOUS | Status: AC
  Administered 2023-01-02: 2 g via INTRAVENOUS
  Filled 2023-01-02: qty 50

## 2023-01-02 MED ORDER — ALBUTEROL SULFATE (2.5 MG/3ML) 0.083% IN NEBU
5.0000 mg | INHALATION_SOLUTION | Freq: Once | RESPIRATORY_TRACT | Status: AC
  Administered 2023-01-02: 5 mg via RESPIRATORY_TRACT
  Filled 2023-01-02: qty 6

## 2023-01-02 MED ORDER — SODIUM CHLORIDE 0.9% FLUSH: 3.0000 mL | INTRAVENOUS | Status: DC | PRN

## 2023-01-02 MED ORDER — IPRATROPIUM BROMIDE 0.02 % IN SOLN
0.5000 mg | Freq: Once | RESPIRATORY_TRACT | Status: AC
  Administered 2023-01-02: 0.5 mg via RESPIRATORY_TRACT
  Filled 2023-01-02: qty 2.5

## 2023-01-02 MED ORDER — PANTOPRAZOLE SODIUM 40 MG PO TBEC
40.0000 mg | DELAYED_RELEASE_TABLET | Freq: Every day | ORAL | Status: DC
  Administered 2023-01-02: 40 mg via ORAL
  Filled 2023-01-02: qty 1

## 2023-01-02 MED ORDER — BUDESONIDE 0.25 MG/2ML IN SUSP
0.2500 mg | Freq: Two times a day (BID) | RESPIRATORY_TRACT | Status: DC
  Administered 2023-01-02 – 2023-01-03 (×2): 0.25 mg via RESPIRATORY_TRACT
  Filled 2023-01-02 (×2): qty 2

## 2023-01-02 MED ORDER — NORTRIPTYLINE HCL 25 MG PO CAPS
25.0000 mg | ORAL_CAPSULE | Freq: Every day | ORAL | Status: DC
  Administered 2023-01-02: 25 mg via ORAL
  Filled 2023-01-02 (×2): qty 1

## 2023-01-02 MED ORDER — MORPHINE SULFATE 15 MG PO TABS: 15.0000 mg | ORAL_TABLET | ORAL | Status: DC | PRN

## 2023-01-02 MED ORDER — ONDANSETRON HCL 4 MG PO TABS: 4.0000 mg | ORAL_TABLET | Freq: Four times a day (QID) | ORAL | Status: DC | PRN

## 2023-01-02 MED ORDER — SODIUM CHLORIDE 0.9 % IV SOLN: 250.0000 mL | INTRAVENOUS | Status: DC | PRN

## 2023-01-02 MED ORDER — ATORVASTATIN CALCIUM 40 MG PO TABS
40.0000 mg | ORAL_TABLET | Freq: Every day | ORAL | Status: DC
  Administered 2023-01-02 – 2023-01-03 (×2): 40 mg via ORAL
  Filled 2023-01-02 (×2): qty 1

## 2023-01-02 MED ORDER — ALUM & MAG HYDROXIDE-SIMETH 200-200-20 MG/5ML PO SUSP
30.0000 mL | Freq: Four times a day (QID) | ORAL | Status: DC | PRN
  Administered 2023-01-02: 30 mL via ORAL
  Filled 2023-01-02: qty 30

## 2023-01-02 MED ORDER — PRIMIDONE 50 MG PO TABS
50.0000 mg | ORAL_TABLET | Freq: Every day | ORAL | Status: DC
  Administered 2023-01-02: 50 mg via ORAL
  Filled 2023-01-02: qty 1

## 2023-01-02 MED ORDER — ASPIRIN 81 MG PO TBEC: 81.0000 mg | DELAYED_RELEASE_TABLET | Freq: Every day | ORAL | Status: DC

## 2023-01-02 NOTE — ED Notes (Signed)
Consulted lab regarding receiving blood for this patient. Was told it was collected at 1035 but not yet received.

## 2023-01-02 NOTE — Progress Notes (Deleted)
CRYSTL LILLARD, female    DOB: 05/22/1956    MRN: [id number]   Brief patient profile:  52  yowf  quit smoking 2015 referred to pulmonary clinic in [place]  09/10/2022 by [facility]  for post hosp f/u    Pt of Dr Raul Del with copd GOLD 4  criteria and housbound at baseline/ 02 dep / pred dep at 5 mg with freq exac and "just calls for appt" when flares. 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    History of Present Illness  09/10/2022  Pulmonary/ 1st office eval/ Cassiel Fernandez / [place] [facility] trelegy 100 and pred 5 mg  Chief Complaint  Patient presents with   Consult    HFU from [place] patient sees Pulmonary @ [facility]    Dyspnea:  room to room ok / walks to car 25 ft/ walker /02  3lpm  Cough: rattling / slt green never turned clear most the time it stays that way  Sleep: flat bed with a bunch of pillows  SABA use: avg use now is none but no feel for max / neb even less  Rec Plan A = Automatic = Always=   Breztri in place of trelegy 100  >>> Take 2 puffs first thing in am and then another 2 puffs about 12 hours later and Prednisone 5 mg   Work on inhaler technique: Plan B = Backup (to supplement plan A, not to replace it) Only use your albuterol inhaler as a rescue medication   Plan C = Crisis (instead of Plan B but only if Plan B stops working) - only use your albuterol nebulizer if you first try Plan B Plan D = Deltasone  If ABC not working take 20 mg prednisone until better then wean down to  5 mg daily  Make sure you check your oxygen saturation  AT  your highest level of activity (not after you stop)   to be sure it stays over 90%  Levaquin 500 mg daily x 7 days  For cough/ congestion > mucinex 1200 mg  every 12 hours   For thrush> clotrimazole troche 10 mg up to  4 x daily     11/19/22 onc note:  ovarian ca  - CT CAP on 02/12/2021 showed peritoneal disease and abdominal lymph nodes have improved.  However new masslike areas in the left upper lobe, new since January 31 CT scan along with borderline enlargement of the left supraclavicular lymph node. - She underwent bronchoscopy and biopsy on 03/09/2021 which was benign. - Niraparib 200 mg daily started around 05/25/2021. - Germline mutation testing was negative. - Hospitalization from 10/03/2022 through 10/10/2022 with severe thrombocytopenia, anemia and leukopenia.  She received 1 unit of PRBC and G-CSF daily.  Niraparib held around 10/07/2022 > stopped all rx at that point due to low plts     11/21/2022  f/u ov/[place] office/Omarion Minnehan re: GOLD 4 / 02 dep   maint on breztri  / prednisone 5 mg floor "x years" Chief Complaint  Patient presents with   Hospitalization Follow-up    [facility] 12/11-12/22 and 12/23-12/26  COPD exacerbation using 4-5LO2 pulse/ cont 24/7   Dyspnea:  room  to room  Cough: variably congested in am / has prn flutter/ no purulent sputum Sleeping: flat bed / lots of pillows  SABA use: not using as per [facility] action plan  02: 4lpm 24 /7 ok to 5lpm walking  Covid status: none  Rec Plan A = Automatic = Always=    breztri Take 2 puffs first thing in am and then another 2 puffs about 12 hours later.  Work on inhaler technique:    Plan B = Backup (to supplement plan A, not to replace it) Only use your albuterol inhaler as a rescue medication Plan C = Crisis (instead of Plan B but only if Plan B stops working) - only use your albuterol nebulizer if you first try Plan B Plan D = Deltasone  If ABC not working take 20 mg prednisone until better then wean down to  5 mg daily  Make sure you check your oxygen saturation  AT  your highest level of activity (not after you stop)   to be sure it stays over 90%  For cough/ congestion > mucinex  1200 mg every 12 hours as needed and use the flutter valve as need Make sure you check your oxygen saturation  AT  your highest level of activity (not after you stop)   to be sure it stays over 90%    01/02/2023  f/u ov/[place] office/Makaelah Cranfield re: *** maint on ***  No chief complaint on file.   Dyspnea:  *** Cough: *** Sleeping: *** SABA use: *** 02: *** Covid status: *** Lung cancer screening: ***   No obvious day to day or daytime variability or assoc excess/ purulent sputum or mucus plugs or hemoptysis or cp or chest tightness, subjective wheeze or overt sinus or hb symptoms.   *** without nocturnal  or early am exacerbation  of respiratory  c/o's or need for noct saba. Also denies any obvious fluctuation of symptoms with weather or environmental changes or other aggravating or alleviating factors except as outlined above   No unusual exposure hx or h/o childhood pna/ asthma or knowledge of premature birth.  Current Allergies, Complete Past Medical History, Past Surgical History, Family History, and Social History were reviewed in [employer] record.  ROS  The following are not active complaints unless bolded Hoarseness, sore throat, dysphagia, dental problems, itching, sneezing,  nasal congestion or discharge of excess mucus or purulent secretions, ear ache,   fever, chills, sweats, unintended wt loss or wt gain, classically pleuritic or exertional cp,  orthopnea pnd or arm/hand swelling  or leg swelling, presyncope, palpitations, abdominal pain, anorexia, nausea, vomiting, diarrhea  or change in bowel habits or change in bladder habits, change in stools or change in urine, dysuria, hematuria,  rash, arthralgias, visual complaints, headache, numbness, weakness or ataxia or problems with walking or coordination,  change in mood or  memory.        No outpatient medications have been marked as taking for the 01/02/23 encounter (Appointment) with Tanda Rockers, MD.              Objective:    Wts    01/02/2023        ***   11/21/22 107 lb 12.8 oz (48.9 kg)  11/19/22 105 lb 12.8 oz (48 kg)  11/15/22 105 lb (47.6 kg)      Vital signs reviewed  01/02/2023  - Note at rest 02 sats  ***% on ***   General appearance:    ***  Mod barrel     - few crackles both bases ***                   Assessment

## 2023-01-02 NOTE — ED Provider Notes (Signed)
[facility] Provider Note   CSN: [id number] Arrival date & time: 01/02/23  [id number]     History  No chief complaint on file.   [name] is a 67 y.o. 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 Patient reports increased need to use her nebulizer over the past week with persistent and worsening productive cough of green sputum.  This morning she called EMS to be transported here in the emergency department.  She was given albuterol prior to arrival which has decreased her wheezing.  She states that she still feels mildly short of breath.  She did not have any sudden onset of shortness of breath.  She denies any history of heart failure.  She has not had fever at home.  HPI     Home Medications Prior to Admission medications   Medication Sig Start Date End Date Taking? Authorizing Provider  albuterol (VENTOLIN HFA) 108 (90 Base) MCG/ACT inhaler Inhale 2 puffs into the lungs every 6 (six) hours as needed for wheezing or shortness of breath. 07/01/22   Roxan Hockey, MD  amLODipine (NORVASC) 5 MG tablet Take 5 mg by mouth at bedtime.     [provider]  aspirin EC 81 MG tablet Take 1 tablet (81 mg total) by mouth daily with breakfast. 07/01/22   Denton Brick, Courage, MD  atorvastatin (LIPITOR) 40 MG tablet Take 1 tablet (40 mg total) by mouth daily. 01/02/22   Dessa Phi, DO  Budeson-Glycopyrrol-Formoterol (BREZTRI AEROSPHERE) 160-9-4.8 MCG/ACT AERO Inhale 2 puffs into the lungs 2 (two) times daily. 09/10/22   Tanda Rockers, MD  ferrous sulfate 325 (65 FE) MG tablet Take 325 mg by mouth every other day.    [provider]  gabapentin (NEURONTIN) 600 MG tablet Take 1 tablet by mouth  2 (two) times daily. 10/17/22   [provider]  ipratropium (ATROVENT HFA) 17 MCG/ACT inhaler Inhale 2 puffs into the lungs every 6 (six) hours as needed for wheezing.    [provider]  levalbuterol Penne Lash) 1.25 MG/3ML nebulizer solution Take 1.25 mg by nebulization in the morning, at noon, and at bedtime. 11/08/22   Johnson, Clanford L, MD  magnesium oxide (MAG-OX) 400 (240 Mg) MG tablet Take 1 tablet (400 mg total) by mouth 2 (two) times daily. 09/06/22   Derek Jack, MD  morphine (MSIR) 15 MG tablet Use 1/2-1 tab every 4 hours as needed for shortness of breath. 11/12/22   Manuella Ghazi, Pratik D, DO  niraparib tosylate (ZEJULA) 100 MG tablet Take 1 tablet (100 mg total) by mouth daily. May take at bedtime to reduce nausea and vomiting. 12/02/22   Derek Jack, MD  nortriptyline (PAMELOR) 25 MG capsule Take 25 mg by mouth at bedtime.    [provider]  OXYGEN Inhale 3 L into the lungs continuous.    [provider]  pantoprazole (PROTONIX) 40 MG tablet Take 1 tablet (40 mg total) by mouth 2 (two) times daily. 07/01/22 11/12/22  Roxan Hockey, MD  potassium chloride SA (KLOR-CON M) 20 MEQ tablet Take 1 tablet (20 mEq total) by mouth daily. 12/31/22   Derek Jack, MD      Allergies    Patient has no known allergies.    Review of Systems  Review of Systems  Physical Exam Updated Vital Signs BP 116/61   Pulse (!) 121   Temp 97.6 F (36.4 C) (Oral)   Resp (!) 32   Ht 5' (1.524 m)   Wt 50.3 kg   SpO2 97%   BMI 21.68 kg/m  Physical Exam Vitals and nursing note reviewed.  Constitutional:      General: She is not in acute distress.    Appearance: She is well-developed. She is not diaphoretic.     Interventions: Nasal cannula in place.  HENT:     Head: Normocephalic and atraumatic.     Right Ear: External ear normal.     Left Ear: External ear normal.     Nose: Nose normal.     Mouth/Throat:     Mouth: Mucous membranes are  moist.  Eyes:     General: No scleral icterus.    Conjunctiva/sclera: Conjunctivae normal.  Cardiovascular:     Rate and Rhythm: Regular rhythm. Tachycardia present.     Heart sounds: Normal heart sounds. No murmur heard.    No friction rub. No gallop.  Pulmonary:     Effort: Tachypnea present.     Breath sounds: Examination of the right-upper field reveals decreased breath sounds. Examination of the left-upper field reveals decreased breath sounds. Examination of the right-middle field reveals decreased breath sounds. Examination of the left-middle field reveals decreased breath sounds. Decreased breath sounds present. No wheezing.     Comments: Coarse crackles BL Lower lobes Abdominal:     General: Bowel sounds are normal. There is no distension.     Palpations: Abdomen is soft. There is no mass.     Tenderness: There is no abdominal tenderness. There is no guarding.  Musculoskeletal:     Cervical back: Normal range of motion.     Right lower leg: No edema.     Left lower leg: No edema.  Skin:    General: Skin is warm and dry.  Neurological:     Mental Status: She is alert and oriented to person, place, and time.  Psychiatric:        Behavior: Behavior normal.     ED Results / Procedures / Treatments   Labs (all labs ordered are listed, but only abnormal results are displayed) Labs Reviewed  RESP PANEL BY RT-PCR (RSV, FLU A&B, COVID)  [id number]  CULTURE, BLOOD (ROUTINE X 2)  CULTURE, BLOOD (ROUTINE X 2)  BASIC METABOLIC PANEL  CBC WITH DIFFERENTIAL/PLATELET  LACTIC ACID, PLASMA  LACTIC ACID, PLASMA  PROTIME-INR  URINALYSIS, ROUTINE W REFLEX MICROSCOPIC    EKG None  Radiology No results found.  Procedures Procedures    Medications Ordered in ED Medications - No data to display  ED Course/ Medical Decision Making/ A&P Clinical Course as of 01/05/23 1302  Thu Jan 02, 2023  1130 Hemoglobin(!): 8.5 [AH]  1130 Potassium(!): 3.2 [AH]  1130 Resp panel by RT-PCR  (RSV, Flu A&B, Covid) Anterior Nasal Swab [AH]    Clinical Course User Index [AH] Margarita Mail, PA-C                             Medical Decision Making This patient presents to the ED for concern of sob, this involves an extensive number of treatment options, and is a complaint that carries with it a high risk of complications and morbidity.  The emergent differential diagnosis for shortness of breath includes, but is not limited to,  Pulmonary edema, bronchoconstriction, Pneumonia, Pulmonary embolism, Pneumotherax/ Hemothorax, Dysrythmia, ACS.   After review of all data points suspect copd exacerbation in the setting of ESLD     Co morbidities that complicate the patient evaluation       COPD, Cancer   Additional history obtained:  EMR, hospital records    Lab Tests:  I Ordered, and personally interpreted labs.  The pertinent results include:      Imaging Studies ordered:  I ordered imaging studies including cxr I independently visualized and interpreted imaging which showed No acute findings I agree with the radiologist interpretation   Cardiac Monitoring/ECG:       The patient was maintained on a cardiac monitor.  I personally viewed and interpreted the cardiac monitored which showed an underlying rhythm of: sinus tachycardia in the setting of albuterol use      Medicines ordered and prescription drug management:  I ordered medication including Medications albuterol (PROVENTIL) (2.5 MG/3ML) 0.083% nebulizer solution 5 mg (5 mg Nebulization Given 01/02/23 1226) magnesium sulfate IVPB 2 g 50 mL (0 g Intravenous Stopped 01/02/23 1337) methylPREDNISolone sodium succinate (SOLU-MEDROL) 125 mg/2 mL injection 125 mg (125 mg Intravenous Given 01/02/23 1227) ipratropium (ATROVENT) nebulizer solution 0.5 mg (0.5 mg Nebulization Given 01/02/23 1226) For copd  Reevaluation of the patient after these medicines showed that the patient stayed the same I have reviewed the  patients home medicines and have made adjustments as needed   Test Considered:       cta chewst   Critical Interventions:       as above   Consultations Obtained:  Dr. Manuella Ghazi who will admit    Problem List / ED Course:       (J44.1) COPD exacerbation ([place])  (primary encounter diagnosis)     Reevaluation:  After the interventions noted above, I reevaluated the patient and found that they have : improved at rest, severe sob with any exertion   Social Determinants of Health:       has a live in boyfriend   Dispostion:  After consideration of the diagnostic results and the patients response to treatment, I feel that the patent would benefit from admission.    Amount and/or Complexity of Data Reviewed Labs: ordered. Decision-making details documented in ED Course. Radiology: ordered and independent interpretation performed. ECG/medicine tests: ordered and independent interpretation performed.  Risk Prescription drug management. Decision regarding hospitalization.           Final Clinical Impression(s) / ED Diagnoses Final diagnoses:  None    Rx / DC Orders ED Discharge Orders     None         Margarita Mail, PA-C 01/05/23 [place], MD 01/07/23 1309

## 2023-01-02 NOTE — ED Triage Notes (Signed)
Pt [facility]. C/o coulg for a few days with green in sputum. On 3L O2 at home and brought in on 3L. Ambulatory. IV established 20 in left ac.

## 2023-01-02 NOTE — H&P (Signed)
History and Physical    [name] [id number] DOB: 03/08/1956 DOA: 01/02/2023  PCP: [name], MD   Patient coming from: Home  Chief Complaint: Dyspnea  HPI: [name] is a 67 y.o. female with medical history significant for ovarian cancer with peritoneal carcinomatosis, COPD with chronic hypoxemia on 4 L nasal cannula, and hypertension who presented to the ED with shortness of breath.  She has had increased need to use her nebulizer over the past week and has persistent, worsening cough with greenish sputum.  She therefore called EMS to bring her to the ED. She denies any fevers or chills.She was set up with home palliative care earlier, however they have not come by to see her as of yet.   ED Course: Vital signs stable with some tachycardia noted.  Respirations are elevated as well.  No fevers or chills.  Hemoglobin 8.5 and potassium 3.2.  Chest x-ray with findings of COPD, but no pneumonia.  No leukocytosis.  COVID and flu testing and RSV negative.  Patient has been given some steroids and breathing treatments with some minimal improvement.  Remains at her baseline 4 L nasal cannula.  Review of Systems: Reviewed as noted above, otherwise negative.  Past Medical History:  Diagnosis Date   Anemia    Aortic atherosclerosis (HCC)    Arthritis    Asthma    Bilateral carotid artery stenosis    Cancer (HCC)    Gastric Cancer   COPD (chronic obstructive pulmonary disease) (HCC)    High cholesterol    Hyperlipidemia    Hypertension    Neuropathy    Osteoarthritis    Ovarian cancer ([place])    Oxygen dependent    Peripheral vascular disease ([place])    Peritoneal carcinoma ([place])    Pneumonia 2019   Port-A-Cath in place 07/03/2020   Pulmonary emphysema [facility])     Past Surgical History:  Procedure Laterality Date   BIOPSY  06/30/2022   Procedure: BIOPSY;  Surgeon: Eloise Harman, DO;  [facility];  Service: Endoscopy;;  gastric   BIOPSY  09/03/2022    Procedure: BIOPSY;  Surgeon: Harvel Quale, MD;  [facility];  Service: Gastroenterology;;   COLONOSCOPY WITH PROPOFOL N/A 09/03/2022   Procedure: COLONOSCOPY WITH PROPOFOL;  Surgeon: Harvel Quale, MD;  [facility];  Service: Gastroenterology;  Laterality: N/A;  900 ASA 3   ESOPHAGEAL BRUSHING  09/03/2022   Procedure: ESOPHAGEAL BRUSHING;  Surgeon: Montez Morita, Quillian Quince, MD;  [facility];  Service: Gastroenterology;;   ESOPHAGOGASTRODUODENOSCOPY (EGD) WITH PROPOFOL N/A 02/17/2021   Procedure: ESOPHAGOGASTRODUODENOSCOPY (EGD) WITH PROPOFOL;  Surgeon: Harvel Quale, MD;  [facility];  Service: Gastroenterology;  Laterality: N/A;   ESOPHAGOGASTRODUODENOSCOPY (EGD) WITH PROPOFOL N/A 05/04/2022   Procedure: ESOPHAGOGASTRODUODENOSCOPY (EGD) WITH PROPOFOL;  Surgeon: Daneil Dolin, MD;  [facility];  Service: Endoscopy;  Laterality: N/A;   ESOPHAGOGASTRODUODENOSCOPY (EGD) WITH PROPOFOL N/A 06/30/2022   Procedure: ESOPHAGOGASTRODUODENOSCOPY (EGD) WITH PROPOFOL;  Surgeon: Eloise Harman, DO;  [facility];  Service: Endoscopy;  Laterality: N/A;   ESOPHAGOGASTRODUODENOSCOPY (EGD) WITH PROPOFOL N/A 09/03/2022   Procedure: ESOPHAGOGASTRODUODENOSCOPY (EGD) WITH PROPOFOL;  Surgeon: Harvel Quale, MD;  [facility];  Service: Gastroenterology;  Laterality: N/A;   HALLUX VALGUS BASE WEDGE Right 06/09/2015   Procedure: Base wedge osteotomy with modified McBride right foot ;  Surgeon: Sharlotte Alamo, MD;  Location: [facility];  Service: Podiatry;  Laterality: Right;   PORTACATH PLACEMENT Left 06/28/2020   Procedure: PORT-A-CATHETER PLACEMENT LEFT CHEST (attached catheter in left subclavian);  Surgeon: Virl Cagey, MD;  Location: [facility];  Service: General;  Laterality: Left;   TUBAL LIGATION     VIDEO BRONCHOSCOPY WITH ENDOBRONCHIAL NAVIGATION N/A 03/09/2021   Procedure: VIDEO  BRONCHOSCOPY WITH ENDOBRONCHIAL NAVIGATION;  Surgeon: Ottie Glazier, MD;  Location: [facility];  Service: Thoracic;  Laterality: N/A;   VIDEO BRONCHOSCOPY WITH ENDOBRONCHIAL ULTRASOUND N/A 03/09/2021   Procedure: VIDEO BRONCHOSCOPY WITH ENDOBRONCHIAL ULTRASOUND;  Surgeon: Ottie Glazier, MD;  Location: [facility];  Service: Thoracic;  Laterality: N/A;     reports that she quit smoking about 9 years ago. Her smoking use included cigarettes. She has a 60.00 pack-year smoking history. She has never used smokeless tobacco. She reports that she does not drink alcohol and does not use drugs.  No Known Allergies  Family History  Problem Relation Age of Onset   Alzheimer's disease Mother    COPD Father    Emphysema Father    Hypertension Father    Healthy Sister    Healthy Brother    Alzheimer's disease Maternal Grandmother    Healthy Sister    Healthy Sister    Healthy Sister    Prostate cancer Other        paternal grandmother's brother; dx in early 21s   Breast cancer Neg Hx     Prior to Admission medications   Medication Sig Start Date End Date Taking? Authorizing Provider  albuterol (VENTOLIN HFA) 108 (90 Base) MCG/ACT inhaler Inhale 2 puffs into the lungs every 6 (six) hours as needed for wheezing or shortness of breath. 07/01/22  Yes Emokpae, Courage, MD  amLODipine (NORVASC) 5 MG tablet Take 5 mg by mouth at bedtime.    Yes [provider]  aspirin EC 81 MG tablet Take 1 tablet (81 mg total) by mouth daily with breakfast. 07/01/22  Yes Emokpae, Courage, MD  atorvastatin (LIPITOR) 40 MG tablet Take 1 tablet (40 mg total) by mouth daily. 01/02/22  Yes Dessa Phi, DO  Budeson-Glycopyrrol-Formoterol (BREZTRI AEROSPHERE) 160-9-4.8 MCG/ACT AERO Inhale 2 puffs into the lungs 2 (two) times daily. 09/10/22  Yes Tanda Rockers, MD  ferrous sulfate 325 (65 FE) MG tablet Take 325 mg by mouth every other day.   Yes [provider]  ipratropium (ATROVENT HFA) 17 MCG/ACT inhaler  Inhale 2 puffs into the lungs every 6 (six) hours as needed for wheezing.   Yes [provider]  levalbuterol (XOPENEX) 1.25 MG/3ML nebulizer solution Take 1.25 mg by nebulization in the morning, at noon, and at bedtime. 11/08/22  Yes Johnson, Clanford L, MD  magnesium oxide (MAG-OX) 400 (240 Mg) MG tablet Take 1 tablet (400 mg total) by mouth 2 (two) times daily. 09/06/22  Yes Derek Jack, MD  morphine (MSIR) 15 MG tablet Use 1/2-1 tab every 4 hours as needed for shortness of breath. 11/12/22  Yes Galilee Pierron D, DO  niraparib tosylate (ZEJULA) 100 MG tablet Take 1 tablet (100 mg total) by mouth daily. May take at bedtime to reduce nausea and vomiting. 12/02/22  Yes Derek Jack, MD  nortriptyline (PAMELOR) 25 MG capsule Take 25 mg by mouth at bedtime.   Yes [provider]  pantoprazole (PROTONIX) 40 MG tablet Take 1 tablet (40 mg total) by mouth 2 (two) times daily. Patient taking differently: Take 40 mg by mouth daily. 07/01/22 01/02/23 Yes Roxan Hockey, MD  potassium chloride SA (KLOR-CON M)  20 MEQ tablet Take 1 tablet (20 mEq total) by mouth daily. 12/31/22  Yes Derek Jack, MD  primidone (MYSOLINE) 50 MG tablet Take 50 mg by mouth at bedtime.   Yes [provider]  gabapentin (NEURONTIN) 600 MG tablet Take 1 tablet by mouth 2 (two) times daily. Patient not taking: Reported on 01/02/2023 10/17/22   [provider]  OXYGEN Inhale 3 L into the lungs continuous.    [provider]    Physical Exam: Vitals:   01/02/23 1100 01/02/23 1130 01/02/23 1200 01/02/23 1242  BP: (!) 105/59 113/78 122/67 122/67  Pulse: (!) 27 (!) 116 (!) 108 (!) 106  Resp: (!) 37 (!) 24 (!) 23 (!) 25  Temp:      TempSrc:      SpO2: 98% 98% 95% 92%  Weight:      Height:        Constitutional: NAD, calm, comfortable Vitals:   01/02/23 1100 01/02/23 1130 01/02/23 1200 01/02/23 1242  BP: (!) 105/59 113/78 122/67 122/67  Pulse: (!) 27 (!) 116 (!)  108 (!) 106  Resp: (!) 37 (!) 24 (!) 23 (!) 25  Temp:      TempSrc:      SpO2: 98% 98% 95% 92%  Weight:      Height:       Eyes: lids and conjunctivae normal Neck: normal, supple Respiratory: Minimal wheezing bilaterally. Normal respiratory effort. No accessory muscle use. 4L [place]. Cardiovascular: Regular rate and rhythm, no murmurs. Abdomen: no tenderness, no distention. Bowel sounds positive.  Musculoskeletal:  No edema. Skin: no rashes, lesions, ulcers.  Psychiatric: Flat affect  Labs on Admission: I have personally reviewed following labs and imaging studies  CBC: Recent Labs  Lab 12/30/22 1257 01/02/23 1035  WBC 3.9* 7.7  NEUTROABS 1.9 4.8  HGB 9.4* 8.5*  HCT 31.1* 27.5*  MCV 98.1 97.2  PLT 230 [id number]   Basic Metabolic Panel: Recent Labs  Lab 12/30/22 1257 01/02/23 1035  NA 138 137  K 3.1* 3.2*  CL 101 96*  CO2 30 33*  GLUCOSE 105* 105*  BUN 7* 22  CREATININE 0.59 0.64  CALCIUM 8.5* 8.0*  MG 1.9  --    GFR: Estimated Creatinine Clearance: 51 mL/min (by C-G formula based on SCr of 0.64 mg/dL). Liver Function Tests: Recent Labs  Lab 12/30/22 1257  AST 28  ALT 10  ALKPHOS 84  BILITOT 0.5  PROT 6.3*  ALBUMIN 3.4*   No results for input(s): "LIPASE", "AMYLASE" in the last 168 hours. No results for input(s): "AMMONIA" in the last 168 hours. Coagulation Profile: Recent Labs  Lab 01/02/23 1035  INR 0.9   Cardiac Enzymes: No results for input(s): "CKTOTAL", "CKMB", "CKMBINDEX", "TROPONINI" in the last 168 hours. BNP (last 3 results) No results for input(s): "PROBNP" in the last 8760 hours. HbA1C: No results for input(s): "HGBA1C" in the last 72 hours. CBG: No results for input(s): "GLUCAP" in the last 168 hours. Lipid Profile: No results for input(s): "CHOL", "HDL", "LDLCALC", "TRIG", "CHOLHDL", "LDLDIRECT" in the last 72 hours. Thyroid Function Tests: No results for input(s): "TSH", "T4TOTAL", "FREET4", "T3FREE", "THYROIDAB" in the last 72  hours. Anemia Panel: No results for input(s): "VITAMINB12", "FOLATE", "FERRITIN", "TIBC", "IRON", "RETICCTPCT" in the last 72 hours. Urine analysis:    Component Value Date/Time   COLORURINE YELLOW 11/15/2022 [place] 11/15/2022 1345   APPEARANCEUR Clear 10/08/2013 2007   LABSPEC 1.016 11/15/2022 1345   LABSPEC 1.003 10/08/2013 2007  PHURINE 7.0 11/15/2022 [place] 11/15/2022 1345   GLUCOSEU Negative 10/08/2013 2007   HGBUR SMALL (A) 11/15/2022 1345   BILIRUBINUR NEGATIVE 11/15/2022 1345   BILIRUBINUR Negative 10/08/2013 2007   KETONESUR 20 (A) 11/15/2022 1345   PROTEINUR 30 (A) 11/15/2022 1345   NITRITE NEGATIVE 11/15/2022 1345   LEUKOCYTESUR MODERATE (A) 11/15/2022 1345   LEUKOCYTESUR Negative 10/08/2013 2007    Radiological Exams on Admission: DG Chest Port 1 View  Result Date: 01/02/2023 CLINICAL DATA:  Cough, sepsis EXAM: PORTABLE CHEST 1 VIEW COMPARISON:  11/15/2022 FINDINGS: Left chest port remains in place. Normal heart size. Aortic atherosclerosis. Chronic emphysematous lung changes with coarsened interstitial markings, most notably in the lung bases and at the left lung apex. No superimposed airspace consolidation. No pleural effusion or pneumothorax. IMPRESSION: Chronic emphysematous lung changes. No acute cardiopulmonary findings. Electronically Signed   By: Davina Poke D.O.   On: 01/02/2023 10:01    EKG: Independently reviewed. ST 122bpm.  Assessment/Plan Principal Problem:   COPD with acute exacerbation (HCC) Active Problems:   Essential hypertension   Chronic respiratory failure with hypoxia (HCC)   Deficiency anemia   Dyslipidemia   Essential tremor   Acute COPD exacerbation with chronic hypoxemia -IV Solumedrol -Duonebs -Azithromycin  Hypokalemia -Replete and reevaluate in am  Anemia -Monitor -No overt bleeding noted  Ovarian cancer with peritoneal carcinomatosis -Appreciate palliative consultation -Set up  home hospice on DC due to poor prognosis  Hypertension -Continue home medications   DVT prophylaxis: Lovenox Code Status: Full Family Communication: None at bedside Disposition Plan:Treat COPD exacerbation and setup home hospice Consults called:Palliative Admission status: Obs, Tele  Severity of Illness: The appropriate patient status for this patient is INPATIENT. Inpatient status is judged to be reasonable and necessary in order to provide the required intensity of service to ensure the patient's safety. The patient's presenting symptoms, physical exam findings, and initial radiographic and laboratory data in the context of their chronic comorbidities is felt to place them at high risk for further clinical deterioration. Furthermore, it is not anticipated that the patient will be medically stable for discharge from the hospital within 2 midnights of admission.   * I certify that at the point of admission it is my clinical judgment that the patient will require inpatient hospital care spanning beyond 2 midnights from the point of admission due to high intensity of service, high risk for further deterioration and high frequency of surveillance required.*   Giannie Soliday D Adryen Cookson DO [facility]  If 7PM-7AM, please contact night-coverage [email or web address]  01/02/2023, 2:34 PM

## 2023-01-03 DIAGNOSIS — J441 Chronic obstructive pulmonary disease with (acute) exacerbation: Secondary | ICD-10-CM | POA: Diagnosis not present

## 2023-01-03 DIAGNOSIS — J9611 Chronic respiratory failure with hypoxia: Secondary | ICD-10-CM | POA: Diagnosis not present

## 2023-01-03 LAB — BASIC METABOLIC PANEL
Anion gap: 8 (ref 5–15)
BUN: 24 mg/dL — ABNORMAL HIGH (ref 8–23)
CO2: 30 mmol/L (ref 22–32)
Calcium: 8.5 mg/dL — ABNORMAL LOW (ref 8.9–10.3)
Chloride: 99 mmol/L (ref 98–111)
Creatinine, Ser: 0.65 mg/dL (ref 0.44–1.00)
GFR, Estimated: >60 mL/min (ref >60)
Glucose, Bld: 139 mg/dL — ABNORMAL HIGH (ref 70–99)
Potassium: 4.6 mmol/L (ref 3.5–5.1)
Sodium: 137 mmol/L (ref 135–145)

## 2023-01-03 LAB — CBC
HCT: 23.8 % — ABNORMAL LOW (ref 36.0–46.0)
Hemoglobin: 7.4 g/dL — ABNORMAL LOW (ref 12.0–15.0)
MCH: 29.6 pg (ref 26.0–34.0)
MCHC: 31.1 g/dL (ref 30.0–36.0)
MCV: 95.2 fL (ref 80.0–100.0)
Platelets: 214 10*3/uL (ref 150–400)
RBC: 2.5 MIL/uL — ABNORMAL LOW (ref 3.87–5.11)
RDW: 16.5 % — ABNORMAL HIGH (ref 11.5–15.5)
WBC: 3.3 10*3/uL — ABNORMAL LOW (ref 4.0–10.5)
nRBC: 0 % (ref 0.0–0.2)

## 2023-01-03 LAB — HEMOGLOBIN AND HEMATOCRIT, BLOOD
HCT: 24.1 % — ABNORMAL LOW (ref 36.0–46.0)
Hemoglobin: 7.5 g/dL — ABNORMAL LOW (ref 12.0–15.0)

## 2023-01-03 LAB — OCCULT BLOOD X 1 CARD TO LAB, STOOL: Fecal Occult Bld: NEGATIVE

## 2023-01-03 LAB — MAGNESIUM: Magnesium: 2.3 mg/dL (ref 1.7–2.4)

## 2023-01-03 MED ORDER — PANTOPRAZOLE SODIUM 40 MG IV SOLR
40.0000 mg | Freq: Two times a day (BID) | INTRAVENOUS | Status: DC
  Administered 2023-01-03: 40 mg via INTRAVENOUS
  Filled 2023-01-03: qty 10

## 2023-01-03 MED ORDER — GUAIFENESIN-DM 100-10 MG/5ML PO SYRP: 5.0000 mL | ORAL_SOLUTION | ORAL | 0 refills | Status: DC | PRN

## 2023-01-03 MED ORDER — PREDNISONE 10 MG PO TABS: 40.0000 mg | ORAL_TABLET | Freq: Every day | ORAL | 0 refills | Status: AC

## 2023-01-03 MED ORDER — BISACODYL 10 MG RE SUPP
10.0000 mg | Freq: Once | RECTAL | Status: AC
  Administered 2023-01-03: 10 mg via RECTAL
  Filled 2023-01-03: qty 1

## 2023-01-03 NOTE — TOC Initial Note (Signed)
Transition of Care [facility]) - Initial/Assessment Note    Patient Details  Name: [name] MRN: [id number] Date of Birth: Sep 11, 1956  [facility] CM/SW Contact:    Salome Arnt, LCSW Phone Number: 01/03/2023, 12:47 PM  Clinical Narrative:  Pt admitted for COPD exacerbation. Assessment completed due to high risk readmission score. Pt known to TOC from previous admissions. She lives with her boyfriend and daughter. Pt is fairly independent with ADLs at baseline. She ambulates with a walker. Pt is on 4L home O2 ([employer]). D/C today. Pt was followed by [facility] in the past. TOC received consult for hospice. Per MD, palliative appropriate. Pt is agreeable to [facility]. Sarah with [facility] notified of d/c and will follow. No other needs reported.                  Expected Discharge Plan: Home/Self Care Barriers to Discharge: Barriers Resolved   Patient Goals and CMS Choice Patient states their goals for this hospitalization and ongoing recovery are:: return home CMS Medicare.gov Compare Post Acute Care list provided to:: Patient Choice offered to / list presented to : Patient [place] ownership interest in [facility].provided to::  (n/a)    Expected Discharge Plan and Services In-house Referral: Clinical Social Work     Living arrangements for the past 2 months: Apartment Expected Discharge Date: 01/03/23                                    Prior Living Arrangements/Services Living arrangements for the past 2 months: Apartment Lives with:: Significant Other, Adult Children Patient language and need for interpreter reviewed:: Yes Do you feel safe going back to the place where you live?: Yes      Need for Family Participation in Patient Care: No (Comment)   Current home services: DME (walker, home O2) Criminal Activity/Legal Involvement Pertinent to Current Situation/Hospitalization: No - Comment as  needed  Activities of Daily Living Home Assistive Devices/Equipment: Walker (specify type) ADL Screening (condition at time of admission) Patient's cognitive ability adequate to safely complete daily activities?: Yes Is the patient deaf or have difficulty hearing?: No Does the patient have difficulty seeing, even when wearing glasses/contacts?: No Does the patient have difficulty concentrating, remembering, or making decisions?: No Patient able to express need for assistance with ADLs?: Yes Does the patient have difficulty dressing or bathing?: No Independently performs ADLs?: Yes (appropriate for developmental age) Does the patient have difficulty walking or climbing stairs?: Yes Weakness of Legs: None Weakness of Arms/Hands: None  Permission Sought/Granted                  Emotional Assessment     Affect (typically observed): Appropriate Orientation: : Oriented to Self, Oriented to Place, Oriented to  Time, Oriented to Situation Alcohol / Substance Use: Not Applicable Psych Involvement: No (comment)  Admission diagnosis:  COPD with acute exacerbation ([place]) [J44.1] Patient Active Problem List   Diagnosis Date Noted   COPD with acute exacerbation ([place]) 01/02/2023   Sepsis due to pneumonia ([place]) 10/29/2022   Dyslipidemia 10/29/2022   Essential tremor 10/29/2022   GERD without esophagitis 10/29/2022   Peripheral neuropathy 10/29/2022   RSV (respiratory syncytial virus pneumonia) 10/29/2022   Symptomatic anemia 10/03/2022   Macrocytic anemia 10/03/2022   Pancytopenia ([place]) 10/03/2022   Thrush 09/11/2022   Odynophagia 08/26/2022   COPD exacerbation ([place]) 07/31/2022  GERD (gastroesophageal reflux disease) 07/15/2022   ABLA (acute blood loss anemia)    Esophagitis 06/28/2022   Hematochezia 05/03/2022   E coli bacteremia 03/11/2022   Hypokalemia 03/11/2022   Hyponatremia 03/10/2022   Thrombocytopenia ([place]) 03/10/2022   Acute on chronic respiratory failure with  hypoxia and hypercapnia (HCC) 12/30/2021   Deficiency anemia 05/23/2021   Peripheral neuropathy due to chemotherapy ([place]) 05/23/2021   Sepsis due to undetermined organism ([place]) 12/18/2020   Lobar pneumonia ([place]) 12/18/2020   Syncope 12/17/2020   Genetic testing 08/01/2020   Ovarian cancer ([place]) 07/07/2020   Port-A-Cath in place 07/03/2020   Family history of prostate cancer 07/03/2020   Goals of care, counseling/discussion 07/01/2020   Peritoneal carcinomatosis ([place]) 06/06/2020   Pneumonia 09/13/2018   Acute on chronic respiratory failure ([place]) 01/22/2018   CAP (community acquired pneumonia) 01/18/2016   Iron deficiency anemia 11/07/2015   Chronic respiratory failure with hypoxia ([place]) 11/07/2015   Acute on chronic respiratory failure with hypoxia ([place]) 11/07/2015   Mixed hyperlipidemia 11/07/2015   COPD GOLD 4/ bronchiectasis/02 dep 11/06/2015   Essential hypertension 11/06/2015   PCP:  [name], MD Pharmacy:   [place], [place] - [address] [place] [address] Phone: [phone] Fax: [place] [address] Phone: [phone] Fax: [phone]     Social Determinants of Health (SDOH) Social History: SDOH Screenings   Food Insecurity: No Food Insecurity (01/02/2023)  Housing: Low Risk  (01/02/2023)  Transportation Needs: No Transportation Needs (01/02/2023)  Utilities: Not At Risk (01/02/2023)  Alcohol Screen: Low Risk  (09/27/2020)  Depression (PHQ2-9): Low Risk  (09/27/2020)  Financial Resource Strain: Low Risk  (09/27/2020)  Physical Activity: Inactive (09/27/2020)  Social Connections: Socially Isolated (09/27/2020)  Stress: No Stress Concern Present (09/27/2020)  Tobacco Use: Medium Risk (01/02/2023)   SDOH Interventions: Housing Interventions: Intervention Not Indicated   Readmission Risk Interventions    01/03/2023   12:44 PM  10/30/2022   10:52 AM 10/07/2022   11:16 AM  Readmission Risk Prevention Plan  Transportation Screening Complete Complete Complete  Medication Review Press photographer) Complete Complete Complete  HRI or Home Care Consult Complete Complete Complete  SW Recovery Care/Counseling Consult Complete Complete Complete  Palliative Care Screening Not Applicable Not Applicable Not Applicable  Skilled Nursing Facility Not Applicable Not Applicable Not Applicable

## 2023-01-03 NOTE — Discharge Summary (Signed)
Physician Discharge Summary  [name] [id number] DOB: October 07, 1956 DOA: 01/02/2023  PCP: [name], MD  Admit date: 01/02/2023  Discharge date: 01/03/2023  Admitted From:Home  Disposition:  Home  Recommendations for Outpatient Follow-up:  Follow up with PCP in 1-2 weeks Follow-up with outpatient palliative services Continue on prednisone daily for 5 days as prescribed Use home breathing treatments as needed Robitussin provided for cough or congestion  Home Health: None, will be set up with outpatient palliative  Equipment/Devices: Has home 4 L nasal cannula  Discharge Condition:Stable  CODE STATUS: Full  Diet recommendation: Heart Healthy  Brief/Interim Summary:  [name] is a 67 y.o. female with medical history significant for ovarian cancer with peritoneal carcinomatosis, COPD with chronic hypoxemia on 4 L nasal cannula, and hypertension who presented to the ED with shortness of breath.  She has had increased need to use her nebulizer over the past week and has persistent, worsening cough with greenish sputum.  She was admitted with mild acute COPD exacerbation started on IV Solu-Medrol as well as scheduled breathing treatments with significant improvement noted this morning back to her usual baseline.  She was noted to have some worsening anemia that is a chronic issue for her and she follows up with hematology outpatient.  No need for transfusion noted and FOBT negative.  She is in stable condition for discharge at this point and will have resumption of her home palliative services.  She would likely need to transition to home hospice care in the very near future as her long-term prognosis is quite poor.  Discharge Diagnoses:  Principal Problem:   COPD with acute exacerbation ([place]) Active Problems:   Essential hypertension   Chronic respiratory failure with hypoxia ([facility])   Deficiency anemia   Dyslipidemia   Essential tremor  Principal discharge diagnosis:  Mild acute COPD exacerbation with known chronic hypoxemia.  Discharge Instructions  Discharge Instructions     Diet - low sodium heart healthy   Complete by: As directed    Increase activity slowly   Complete by: As directed       Allergies as of 01/03/2023   No Known Allergies      Medication List     TAKE these medications    albuterol 108 (90 Base) MCG/ACT inhaler Commonly known as: VENTOLIN HFA Inhale 2 puffs into the lungs every 6 (six) hours as needed for wheezing or shortness of breath.   amLODipine 5 MG tablet Commonly known as: NORVASC Take 5 mg by mouth at bedtime.   aspirin EC 81 MG tablet Take 1 tablet (81 mg total) by mouth daily with breakfast.   atorvastatin 40 MG tablet Commonly known as: LIPITOR Take 1 tablet (40 mg total) by mouth daily.   Breztri Aerosphere 160-9-4.8 MCG/ACT Aero Generic drug: Budeson-Glycopyrrol-Formoterol Inhale 2 puffs into the lungs 2 (two) times daily.   ferrous sulfate 325 (65 FE) MG tablet Take 325 mg by mouth every other day.   gabapentin 600 MG tablet Commonly known as: NEURONTIN Take 1 tablet by mouth 2 (two) times daily.   guaiFENesin-dextromethorphan 100-10 MG/5ML syrup Commonly known as: ROBITUSSIN DM Take 5 mLs by mouth every 4 (four) hours as needed for cough.   ipratropium 17 MCG/ACT inhaler Commonly known as: ATROVENT HFA Inhale 2 puffs into the lungs every 6 (six) hours as needed for wheezing.   levalbuterol 1.25 MG/3ML nebulizer solution Commonly known as: XOPENEX Take 1.25 mg by nebulization in the morning, at noon, and at  bedtime.   magnesium oxide 400 (240 Mg) MG tablet Commonly known as: MAG-OX Take 1 tablet (400 mg total) by mouth 2 (two) times daily.   morphine 15 MG tablet Commonly known as: MSIR Use 1/2-1 tab every 4 hours as needed for shortness of breath.   nortriptyline 25 MG capsule Commonly known as: PAMELOR Take 25 mg by mouth at bedtime.   OXYGEN Inhale 3 L into the lungs  continuous.   pantoprazole 40 MG tablet Commonly known as: Protonix Take 1 tablet (40 mg total) by mouth 2 (two) times daily. What changed: when to take this   potassium chloride SA 20 MEQ tablet Commonly known as: KLOR-CON M Take 1 tablet (20 mEq total) by mouth daily.   predniSONE 10 MG tablet Commonly known as: DELTASONE Take 4 tablets (40 mg total) by mouth daily for 5 days.   primidone 50 MG tablet Commonly known as: MYSOLINE Take 50 mg by mouth at bedtime.   Zejula 100 MG tablet Generic drug: niraparib tosylate Take 1 tablet (100 mg total) by mouth daily. May take at bedtime to reduce nausea and vomiting.        Follow-up Information     [name], MD. Schedule an appointment as soon as possible for a visit in 1 week(s).   Specialty: Family Medicine Contact information: [place] [address] [phone]                No Known Allergies  Consultations: None   Procedures/Studies: DG Chest Port 1 View  Result Date: 01/02/2023 CLINICAL DATA:  Cough, sepsis EXAM: PORTABLE CHEST 1 VIEW COMPARISON:  11/15/2022 FINDINGS: Left chest port remains in place. Normal heart size. Aortic atherosclerosis. Chronic emphysematous lung changes with coarsened interstitial markings, most notably in the lung bases and at the left lung apex. No superimposed airspace consolidation. No pleural effusion or pneumothorax. IMPRESSION: Chronic emphysematous lung changes. No acute cardiopulmonary findings. Electronically Signed   By: Davina Poke D.O.   On: 01/02/2023 10:01     Discharge Exam: Vitals:   01/03/23 0758 01/03/23 0759  BP:    Pulse:    Resp:    Temp:    SpO2: 95% 100%   Vitals:   01/03/23 0153 01/03/23 0455 01/03/23 0758 01/03/23 0759  BP:  (!) 104/59    Pulse:  80    Resp:  16    Temp:  97.8 F (36.6 C)    TempSrc:  Oral    SpO2: 99% 100% 95% 100%  Weight:      Height:        General: Pt is alert, awake, not in acute  distress Cardiovascular: RRR, S1/S2 +, no rubs, no gallops Respiratory: CTA bilaterally, no wheezing, no rhonchi, 4 L nasal cannula Abdominal: Soft, NT, ND, bowel sounds + Extremities: no edema, no cyanosis    The results of significant diagnostics from this hospitalization (including imaging, microbiology, ancillary and laboratory) are listed below for reference.     Microbiology: Recent Results (from the past 240 hour(s))  Resp panel by RT-PCR (RSV, Flu A&B, Covid) Anterior Nasal Swab     Status: None   Collection Time: 01/02/23 10:12 AM   Specimen: Anterior Nasal Swab  Result Value Ref Range Status   SARS Coronavirus 2 by RT PCR NEGATIVE NEGATIVE Final    Comment: (NOTE) SARS-CoV-2 target nucleic acids are NOT DETECTED.  The SARS-CoV-2 RNA is generally detectable in upper respiratory specimens during the acute phase of  infection. The lowest concentration of SARS-CoV-2 viral copies this assay can detect is 138 copies/mL. A negative result does not preclude SARS-Cov-2 infection and should not be used as the sole basis for treatment or other patient management decisions. A negative result may occur with  improper specimen collection/handling, submission of specimen other than nasopharyngeal swab, presence of viral mutation(s) within the areas targeted by this assay, and inadequate number of viral copies(<138 copies/mL). A negative result must be combined with clinical observations, patient history, and epidemiological information. The expected result is Negative.  Fact Sheet for Patients:  EntrepreneurPulse.com.au  Fact Sheet for Healthcare Providers:  IncredibleEmployment.be  This test is no t yet approved or cleared by the [place] FDA and  has been authorized for detection and/or diagnosis of SARS-CoV-2 by FDA under an Emergency Use Authorization (EUA). This EUA will remain  in effect (meaning this test can be used) for the  duration of the COVID-19 declaration under Section 564(b)(1) of the Act, 21 U.S.C.section 360bbb-3(b)(1), unless the authorization is terminated  or revoked sooner.       Influenza A by PCR NEGATIVE NEGATIVE Final   Influenza B by PCR NEGATIVE NEGATIVE Final    Comment: (NOTE) The Xpert Xpress SARS-CoV-2/FLU/RSV plus assay is intended as an aid in the diagnosis of influenza from Nasopharyngeal swab specimens and should not be used as a sole basis for treatment. Nasal washings and aspirates are unacceptable for Xpert Xpress SARS-CoV-2/FLU/RSV testing.  Fact Sheet for Patients: EntrepreneurPulse.com.au  Fact Sheet for Healthcare Providers: IncredibleEmployment.be  This test is not yet approved or cleared by the [place] FDA and has been authorized for detection and/or diagnosis of SARS-CoV-2 by FDA under an Emergency Use Authorization (EUA). This EUA will remain in effect (meaning this test can be used) for the duration of the COVID-19 declaration under Section 564(b)(1) of the Act, 21 U.S.C. section 360bbb-3(b)(1), unless the authorization is terminated or revoked.     Resp Syncytial Virus by PCR NEGATIVE NEGATIVE Final    Comment: (NOTE) Fact Sheet for Patients: EntrepreneurPulse.com.au  Fact Sheet for Healthcare Providers: IncredibleEmployment.be  This test is not yet approved or cleared by the [place] FDA and has been authorized for detection and/or diagnosis of SARS-CoV-2 by FDA under an Emergency Use Authorization (EUA). This EUA will remain in effect (meaning this test can be used) for the duration of the COVID-19 declaration under Section 564(b)(1) of the Act, 21 U.S.C. section 360bbb-3(b)(1), unless the authorization is terminated or revoked.  Performed at [facility], [address], [place], [place] [address]      Labs: BNP (last 3 results) Recent Labs    07/31/22 1745  10/03/22 1817 11/15/22 1310  BNP 29.0 18.0 [id number]   Basic Metabolic Panel: Recent Labs  Lab 12/30/22 1257 01/02/23 1035 01/03/23 0403  NA 138 137 137  K 3.1* 3.2* 4.6  CL 101 96* 99  CO2 30 33* 30  GLUCOSE 105* 105* 139*  BUN 7* 22 24*  CREATININE 0.59 0.64 0.65  CALCIUM 8.5* 8.0* 8.5*  MG 1.9  --  2.3   Liver Function Tests: Recent Labs  Lab 12/30/22 1257  AST 28  ALT 10  ALKPHOS 84  BILITOT 0.5  PROT 6.3*  ALBUMIN 3.4*   No results for input(s): "LIPASE", "AMYLASE" in the last 168 hours. No results for input(s): "AMMONIA" in the last 168 hours. CBC: Recent Labs  Lab 12/30/22 1257 01/02/23 1035 01/03/23 0403 01/03/23 0833  WBC 3.9* 7.7 3.3*  --  NEUTROABS 1.9 4.8  --   --   HGB 9.4* 8.5* 7.4* 7.5*  HCT 31.1* 27.5* 23.8* 24.1*  MCV 98.1 97.2 95.2  --   PLT 230 227 214  --    Cardiac Enzymes: No results for input(s): "CKTOTAL", "CKMB", "CKMBINDEX", "TROPONINI" in the last 168 hours. BNP: Invalid input(s): "POCBNP" CBG: No results for input(s): "GLUCAP" in the last 168 hours. D-Dimer No results for input(s): "DDIMER" in the last 72 hours. Hgb A1c No results for input(s): "HGBA1C" in the last 72 hours. Lipid Profile No results for input(s): "CHOL", "HDL", "LDLCALC", "TRIG", "CHOLHDL", "LDLDIRECT" in the last 72 hours. Thyroid function studies No results for input(s): "TSH", "T4TOTAL", "T3FREE", "THYROIDAB" in the last 72 hours.  Invalid input(s): "FREET3" Anemia work up No results for input(s): "VITAMINB12", "FOLATE", "FERRITIN", "TIBC", "IRON", "RETICCTPCT" in the last 72 hours. Urinalysis    Component Value Date/Time   COLORURINE YELLOW 11/15/2022 [place] 11/15/2022 1345   APPEARANCEUR Clear 10/08/2013 2007   LABSPEC 1.016 11/15/2022 1345   LABSPEC 1.003 10/08/2013 2007   PHURINE 7.0 11/15/2022 1345   GLUCOSEU NEGATIVE 11/15/2022 1345   GLUCOSEU Negative 10/08/2013 2007   HGBUR SMALL (A) 11/15/2022 1345   BILIRUBINUR  NEGATIVE 11/15/2022 1345   BILIRUBINUR Negative 10/08/2013 2007   KETONESUR 20 (A) 11/15/2022 1345   PROTEINUR 30 (A) 11/15/2022 1345   NITRITE NEGATIVE 11/15/2022 1345   LEUKOCYTESUR MODERATE (A) 11/15/2022 1345   LEUKOCYTESUR Negative 10/08/2013 2007   Sepsis Labs Recent Labs  Lab 12/30/22 1257 01/02/23 1035 01/03/23 0403  WBC 3.9* 7.7 3.3*   Microbiology Recent Results (from the past 240 hour(s))  Resp panel by RT-PCR (RSV, Flu A&B, Covid) Anterior Nasal Swab     Status: None   Collection Time: 01/02/23 10:12 AM   Specimen: Anterior Nasal Swab  Result Value Ref Range Status   SARS Coronavirus 2 by RT PCR NEGATIVE NEGATIVE Final    Comment: (NOTE) SARS-CoV-2 target nucleic acids are NOT DETECTED.  The SARS-CoV-2 RNA is generally detectable in upper respiratory specimens during the acute phase of infection. The lowest concentration of SARS-CoV-2 viral copies this assay can detect is 138 copies/mL. A negative result does not preclude SARS-Cov-2 infection and should not be used as the sole basis for treatment or other patient management decisions. A negative result may occur with  improper specimen collection/handling, submission of specimen other than nasopharyngeal swab, presence of viral mutation(s) within the areas targeted by this assay, and inadequate number of viral copies(<138 copies/mL). A negative result must be combined with clinical observations, patient history, and epidemiological information. The expected result is Negative.  Fact Sheet for Patients:  EntrepreneurPulse.com.au  Fact Sheet for Healthcare Providers:  IncredibleEmployment.be  This test is no t yet approved or cleared by the [place] FDA and  has been authorized for detection and/or diagnosis of SARS-CoV-2 by FDA under an Emergency Use Authorization (EUA). This EUA will remain  in effect (meaning this test can be used) for the duration of the COVID-19  declaration under Section 564(b)(1) of the Act, 21 U.S.C.section 360bbb-3(b)(1), unless the authorization is terminated  or revoked sooner.       Influenza A by PCR NEGATIVE NEGATIVE Final   Influenza B by PCR NEGATIVE NEGATIVE Final    Comment: (NOTE) The Xpert Xpress SARS-CoV-2/FLU/RSV plus assay is intended as an aid in the diagnosis of influenza from Nasopharyngeal swab specimens and should not be used as a sole basis for treatment. Nasal  washings and aspirates are unacceptable for Xpert Xpress SARS-CoV-2/FLU/RSV testing.  Fact Sheet for Patients: EntrepreneurPulse.com.au  Fact Sheet for Healthcare Providers: IncredibleEmployment.be  This test is not yet approved or cleared by the [place] FDA and has been authorized for detection and/or diagnosis of SARS-CoV-2 by FDA under an Emergency Use Authorization (EUA). This EUA will remain in effect (meaning this test can be used) for the duration of the COVID-19 declaration under Section 564(b)(1) of the Act, 21 U.S.C. section 360bbb-3(b)(1), unless the authorization is terminated or revoked.     Resp Syncytial Virus by PCR NEGATIVE NEGATIVE Final    Comment: (NOTE) Fact Sheet for Patients: EntrepreneurPulse.com.au  Fact Sheet for Healthcare Providers: IncredibleEmployment.be  This test is not yet approved or cleared by the [place] FDA and has been authorized for detection and/or diagnosis of SARS-CoV-2 by FDA under an Emergency Use Authorization (EUA). This EUA will remain in effect (meaning this test can be used) for the duration of the COVID-19 declaration under Section 564(b)(1) of the Act, 21 U.S.C. section 360bbb-3(b)(1), unless the authorization is terminated or revoked.  Performed at [facility], [address] [place], [place] [address]      Time coordinating discharge: 35 minutes  SIGNED:   Rodena Goldmann, DO [facility] 01/03/2023, 12:22 PM  If 7PM-7AM, please contact night-coverage [email or web address]

## 2023-01-03 NOTE — Care Management CC44 (Signed)
Condition Code 44 Documentation Completed  Patient Details  Name: JASREET ACE MRN: [id number] Date of Birth: 08-Jan-1956   Condition Code 44 given:  Yes Patient signature on Condition Code 44 notice:  Yes Documentation of 2 MD's agreement:  Yes Code 44 added to claim:  Yes    Salome Arnt, [place] 01/03/2023, 10:07 AM

## 2023-01-03 NOTE — Care Management Obs Status (Signed)
[place] NOTIFICATION   Patient Details  Name: [name] MRN: [id number] Date of Birth: 1956-05-20   Medicare Observation Status Notification Given:  Yes    Salome Arnt, [place] 01/03/2023, 10:07 AM

## 2023-01-03 NOTE — Consult Note (Signed)
NAME:  [name], MRN:  [id number], DOB:  09/12/1956, LOS: 1 ADMISSION DATE:  01/02/2023, CONSULTATION DATE:  01/03/23  REFERRING MD: Manuella Ghazi, CHIEF COMPLAINT:  sob at rest    History of Present Illness:  66 yowf quit smoking 2015 with Gold 4 copd/ 02 dep with baseline borderline hypercapnea adm 2/15 with sob at rest not relieved by saba. Says no warning the day prior though not checking sats with exertion, not following instructions on appropriate use of SABA and maint on breztri 2bid and prednisone floor of 5 mg (has ceiling of 20 mg but did not follow the contingency to increase it to ceiling of 20) and not following rx for ppi use.  Woke up "hot room" am pf ad,ot  and could not catch her breath at rest so came to ER.  Not clear what 02 setting she's really using at home. She quotes me Dr Gust Brooms prior rec although I had already tweaked them, she's still going by the old ones   No cough or cp/ some mild HB.  No leg swelling or sinus symptoms   Pertinent  Medical History   medical history significant for ovarian cancer with peritoneal carcinomatosis, COPD with chronic hypoxemia on 4 L nasal cannula, and hypertension who presented to the ED with shortness of breath.  She has had increased need to use her nebulizer over the past week and has persistent, worsening cough with greenish sputum.  She therefore called EMS to bring her to the ED. She denies any fevers or chills.She was set up with home palliative care earlier, however they have not come by to see her as of yet.   ED Course: Vital signs stable with some tachycardia noted.  Respirations are elevated as well.  No fevers or chills.  Hemoglobin 8.5 and potassium 3.2.  Chest x-ray with findings of COPD, but no pneumonia.  No leukocytosis.  COVID and flu testing and RSV negative.  Patient has been given some steroids and breathing treatments with some minimal improvement.  Remains at her baseline 4 L nasal cannula.  Significant Hospital  Events: Including procedures, antibiotic start and stop dates in addition to other pertinent events     Interim History / Subjective:  Better this am/ no cp or cough or resting sob   Objective   Blood pressure (!) 104/59, pulse 80, temperature 97.8 F (36.6 C), temperature source Oral, resp. rate 16, height 5' (1.524 m), weight 50.7 kg, SpO2 100 %.        Intake/Output Summary (Last 24 hours) at 01/03/2023 0840 Last data filed at 01/03/2023 0500 Gross per 24 hour  Intake 524.6 ml  Output 600 ml  Net -75.4 ml   Filed Weights   01/02/23 0924 01/02/23 1522  Weight: 50.3 kg 50.7 kg    Examination: Tmax:  98.8 General appearance:    chronically ill >>> stated age   At Rest 02 sats  100% on 4lpm   No jvd Oropharynx clear,  mucosa nl Neck supple Lungs with distant bs  bilaterally RRR no s3 or or sign murmur Abd soft/ non-tender limited  excursion  Extr warm with no edema or clubbing noted Neuro  Sensorium intact,  no apparent motor deficits      I personally reviewed images and agree with radiology impression as follows:  CXR:   portable  2/15  Hyperinflation typical of copd/emphysema no acute changes   Assessment & Plan:  1) atypical aecopd as no antecedent  uri or  cough/ sinus cc  DDX of  difficult airways management almost all start with A and  include Adherence, Ace Inhibitors, Acid Reflux, Active Sinus Disease, Alpha 1 Antitripsin deficiency, Anxiety masquerading as Airways dz,  ABPA,  Allergy(esp in young), Aspiration (esp in elderly), Adverse effects of meds,  Active smoking or vaping, A bunch of PE's (a small clot burden can't cause this syndrome unless there is already severe underlying pulm or vascular dz with poor reserve),  Anemia/thyroid dz plus two Bs  = Bronchiectasis and Beta blocker use..and one C= CHF  ? Acid (or non-acid) GERD >rec ppi  Take 30- 60 min before your first and last meals of the day   Allergy   Eos 0.0  >  seems unlikely   ? Alpha One >  will send phenotype.  ? Anxiety/depression/ cognitive decline > will enlist pt's daughter to keep up with recs esp action plans provided on most visits  Anemia may be playing a role here   ? Chf > check bnp = 19.0 so unlikely     Labs   CBC: Recent Labs  Lab 12/30/22 1257 01/02/23 1035 01/03/23 0403  WBC 3.9* 7.7 3.3*  NEUTROABS 1.9 4.8  --   HGB 9.4* 8.5* 7.4*  HCT 31.1* 27.5* 23.8*  MCV 98.1 97.2 95.2  PLT 230 227 [id number]    Basic Metabolic Panel: Recent Labs  Lab 12/30/22 1257 01/02/23 1035 01/03/23 0403  NA 138 137 137  K 3.1* 3.2* 4.6  CL 101 96* 99  CO2 30 33* 30  GLUCOSE 105* 105* 139*  BUN 7* 22 24*  CREATININE 0.59 0.64 0.65  CALCIUM 8.5* 8.0* 8.5*  MG 1.9  --  2.3   GFR: Estimated Creatinine Clearance: 51 mL/min (by C-G formula based on SCr of 0.65 mg/dL). Recent Labs  Lab 12/30/22 1257 01/02/23 1035 01/02/23 1250 01/03/23 0403  WBC 3.9* 7.7  --  3.3*  LATICACIDVEN  --  1.5 0.9  --     Liver Function Tests: Recent Labs  Lab 12/30/22 1257  AST 28  ALT 10  ALKPHOS 84  BILITOT 0.5  PROT 6.3*  ALBUMIN 3.4*   No results for input(s): "LIPASE", "AMYLASE" in the last 168 hours. No results for input(s): "AMMONIA" in the last 168 hours.  ABG    Component Value Date/Time   PHART 7.45 07/31/2022 2109   PCO2ART 49 (H) 07/31/2022 2109   PO2ART 82 (L) 07/31/2022 2109   HCO3 32.4 (H) 11/15/2022 1310   ACIDBASEDEF 2.1 (H) 01/18/2016 1250   O2SAT 88.5 11/15/2022 1310     Coagulation Profile: Recent Labs  Lab 01/02/23 1035  INR 0.9    Cardiac Enzymes: No results for input(s): "CKTOTAL", "CKMB", "CKMBINDEX", "TROPONINI" in the last 168 hours.  HbA1C: Hemoglobin A1C  Date/Time Value Ref Range Status  11/01/2013 05:58 AM 5.6 4.2 - 6.3 % Final    Comment:    The American Diabetes Association recommends that a primary goal of therapy should be <7% and that physicians should reevaluate the treatment regimen in patients with HbA1c values  consistently >8%.     CBG: No results for input(s): "GLUCAP" in the last 168 hours.     Past Medical History:  She,  has a past medical history of Anemia, Aortic atherosclerosis ([place]), Arthritis, Asthma, Bilateral carotid artery stenosis, Cancer ([place]), COPD (chronic obstructive pulmonary disease) ([place]), High cholesterol, Hyperlipidemia, Hypertension, Neuropathy, Osteoarthritis, Ovarian cancer ([place]), Oxygen dependent, Peripheral vascular disease ([place]), Peritoneal carcinoma ([place]), Pneumonia (2019),  Port-A-Cath in place (07/03/2020), and Pulmonary emphysema ([place]).   Surgical History:   Past Surgical History:  Procedure Laterality Date   BIOPSY  06/30/2022   Procedure: BIOPSY;  Surgeon: Eloise Harman, DO;  [facility];  Service: Endoscopy;;  gastric   BIOPSY  09/03/2022   Procedure: BIOPSY;  Surgeon: Harvel Quale, MD;  [facility];  Service: Gastroenterology;;   COLONOSCOPY WITH PROPOFOL N/A 09/03/2022   Procedure: COLONOSCOPY WITH PROPOFOL;  Surgeon: Harvel Quale, MD;  [facility];  Service: Gastroenterology;  Laterality: N/A;  900 ASA 3   ESOPHAGEAL BRUSHING  09/03/2022   Procedure: ESOPHAGEAL BRUSHING;  Surgeon: Montez Morita, Quillian Quince, MD;  [facility];  Service: Gastroenterology;;   ESOPHAGOGASTRODUODENOSCOPY (EGD) WITH PROPOFOL N/A 02/17/2021   Procedure: ESOPHAGOGASTRODUODENOSCOPY (EGD) WITH PROPOFOL;  Surgeon: Harvel Quale, MD;  [facility];  Service: Gastroenterology;  Laterality: N/A;   ESOPHAGOGASTRODUODENOSCOPY (EGD) WITH PROPOFOL N/A 05/04/2022   Procedure: ESOPHAGOGASTRODUODENOSCOPY (EGD) WITH PROPOFOL;  Surgeon: Daneil Dolin, MD;  [facility];  Service: Endoscopy;  Laterality: N/A;   ESOPHAGOGASTRODUODENOSCOPY (EGD) WITH PROPOFOL N/A 06/30/2022   Procedure: ESOPHAGOGASTRODUODENOSCOPY (EGD) WITH PROPOFOL;  Surgeon: Eloise Harman, DO;  [facility];   Service: Endoscopy;  Laterality: N/A;   ESOPHAGOGASTRODUODENOSCOPY (EGD) WITH PROPOFOL N/A 09/03/2022   Procedure: ESOPHAGOGASTRODUODENOSCOPY (EGD) WITH PROPOFOL;  Surgeon: Harvel Quale, MD;  [facility];  Service: Gastroenterology;  Laterality: N/A;   HALLUX VALGUS BASE WEDGE Right 06/09/2015   Procedure: Base wedge osteotomy with modified McBride right foot ;  Surgeon: Sharlotte Alamo, MD;  Location: [facility];  Service: Podiatry;  Laterality: Right;   PORTACATH PLACEMENT Left 06/28/2020   Procedure: PORT-A-CATHETER PLACEMENT LEFT CHEST (attached catheter in left subclavian);  Surgeon: Virl Cagey, MD;  Location: [facility];  Service: General;  Laterality: Left;   TUBAL LIGATION     VIDEO BRONCHOSCOPY WITH ENDOBRONCHIAL NAVIGATION N/A 03/09/2021   Procedure: VIDEO BRONCHOSCOPY WITH ENDOBRONCHIAL NAVIGATION;  Surgeon: Ottie Glazier, MD;  Location: [facility];  Service: Thoracic;  Laterality: N/A;   VIDEO BRONCHOSCOPY WITH ENDOBRONCHIAL ULTRASOUND N/A 03/09/2021   Procedure: VIDEO BRONCHOSCOPY WITH ENDOBRONCHIAL ULTRASOUND;  Surgeon: Ottie Glazier, MD;  Location: [facility];  Service: Thoracic;  Laterality: N/A;     Social History:   reports that she quit smoking about 9 years ago. Her smoking use included cigarettes. She has a 60.00 pack-year smoking history. She has never used smokeless tobacco. She reports that she does not drink alcohol and does not use drugs.   Family History:  Her family history includes Alzheimer's disease in her maternal grandmother and mother; COPD in her father; Emphysema in her father; Healthy in her brother, sister, sister, sister, and sister; Hypertension in her father; Prostate cancer in an other family member. There is no history of Breast cancer.   Allergies No Known Allergies   Home Medications  Prior to Admission medications   Medication Sig Start Date End Date Taking? Authorizing Provider  albuterol (VENTOLIN HFA) 108 (90 Base) MCG/ACT  inhaler Inhale 2 puffs into the lungs every 6 (six) hours as needed for wheezing or shortness of breath. 07/01/22  Yes Emokpae, Courage, MD  amLODipine (NORVASC) 5 MG tablet Take 5 mg by mouth at bedtime.    Yes [provider]  aspirin EC 81 MG tablet Take 1 tablet (81 mg total) by mouth daily with breakfast. 07/01/22  Yes Roxan Hockey, MD  atorvastatin (LIPITOR) 40 MG tablet Take 1 tablet (40 mg total) by mouth daily. 01/02/22  Yes Dessa Phi, DO  Budeson-Glycopyrrol-Formoterol (BREZTRI AEROSPHERE) 160-9-4.8 MCG/ACT AERO Inhale 2 puffs into the lungs 2 (two) times daily. 09/10/22  Yes Tanda Rockers, MD  ferrous sulfate 325 (65 FE) MG tablet Take 325 mg by mouth every other day.   Yes [provider]  ipratropium (ATROVENT HFA) 17 MCG/ACT inhaler Inhale 2 puffs into the lungs every 6 (six) hours as needed for wheezing.   Yes [provider]  levalbuterol (XOPENEX) 1.25 MG/3ML nebulizer solution Take 1.25 mg by nebulization in the morning, at noon, and at bedtime. 11/08/22  Yes Johnson, Clanford L, MD  magnesium oxide (MAG-OX) 400 (240 Mg) MG tablet Take 1 tablet (400 mg total) by mouth 2 (two) times daily. 09/06/22  Yes Derek Jack, MD  morphine (MSIR) 15 MG tablet Use 1/2-1 tab every 4 hours as needed for shortness of breath. 11/12/22  Yes Shah, Pratik D, DO  niraparib tosylate (ZEJULA) 100 MG tablet Take 1 tablet (100 mg total) by mouth daily. May take at bedtime to reduce nausea and vomiting. 12/02/22  Yes Derek Jack, MD  nortriptyline (PAMELOR) 25 MG capsule Take 25 mg by mouth at bedtime.   Yes [provider]  pantoprazole (PROTONIX) 40 MG tablet Take 1 tablet (40 mg total) by mouth 2 (two) times daily. Patient taking differently: Take 40 mg by mouth daily. 07/01/22 01/02/23 Yes Emokpae, Courage, MD  potassium chloride SA (KLOR-CON M) 20 MEQ tablet Take 1 tablet (20 mEq total) by mouth daily. 12/31/22  Yes Derek Jack, MD   primidone (MYSOLINE) 50 MG tablet Take 50 mg by mouth at bedtime.   Yes [provider]  gabapentin (NEURONTIN) 600 MG tablet Take 1 tablet by mouth 2 (two) times daily. Patient not taking: Reported on 01/02/2023 10/17/22   [provider]  OXYGEN Inhale 3 L into the lungs continuous.    [provider]       Christinia Gully, MD Pulmonary and [place] [phone]   After 7:00 pm call Elink  [phone]

## 2023-01-08 LAB — CULTURE, BLOOD (ROUTINE X 2)
Culture: NO GROWTH
Culture: NO GROWTH

## 2023-01-10 ENCOUNTER — Ambulatory Visit: Payer: 59 | Admitting: Internal Medicine

## 2023-01-13 ENCOUNTER — Encounter: Payer: Self-pay | Admitting: Internal Medicine

## 2023-01-13 ENCOUNTER — Ambulatory Visit: Payer: 59 | Admitting: Internal Medicine

## 2023-01-13 ENCOUNTER — Other Ambulatory Visit: Payer: Self-pay | Admitting: *Deleted

## 2023-01-13 ENCOUNTER — Telehealth: Payer: Self-pay

## 2023-01-13 VITALS — BP 132/76 | HR 98 | Ht 60.0 in | Wt 110.2 lb

## 2023-01-13 DIAGNOSIS — J9622 Acute and chronic respiratory failure with hypercapnia: Secondary | ICD-10-CM

## 2023-01-13 DIAGNOSIS — C786 Secondary malignant neoplasm of retroperitoneum and peritoneum: Secondary | ICD-10-CM

## 2023-01-13 DIAGNOSIS — D539 Nutritional anemia, unspecified: Secondary | ICD-10-CM

## 2023-01-13 DIAGNOSIS — J9621 Acute and chronic respiratory failure with hypoxia: Secondary | ICD-10-CM

## 2023-01-13 DIAGNOSIS — J449 Chronic obstructive pulmonary disease, unspecified: Secondary | ICD-10-CM

## 2023-01-13 DIAGNOSIS — C569 Malignant neoplasm of unspecified ovary: Secondary | ICD-10-CM

## 2023-01-13 NOTE — Telephone Encounter (Signed)
Palliative care outreach to patient to complete initial PC consult.  Call unsuccessful. SW LVM.

## 2023-01-13 NOTE — Progress Notes (Signed)
Ambulatory referral placed to Palliative care with [facility] per Dr. Tomie China request.

## 2023-01-13 NOTE — Assessment & Plan Note (Signed)
Quit smoking 2015 - pt of Dr Raul Del with copd GOLD 4  criteria and housbound at [place]/ 02 dep / pred dep at 5 mg with freq exac  - CTa  11/15/22 c/w bronchiectasis  - 11/21/2022   only using breztri in am > rec 2bid /flutter valve and pred ceiling of 20/ floor of 5  - Labs ordered 01/13/2023  :    alpha one AT phenotype     Group D (now reclassified as E) in terms of symptom/risk and laba/lama/ICS  therefore appropriate rx at this point >>>  breztri plus approp use of saba -  see avs for instructions unique to this ov for action plan   >>> 01/13/2023  After extensive coaching inhaler device,  effectiveness =    75% with hfa > continue breztri with pred floor of 5 mg daily

## 2023-01-13 NOTE — Progress Notes (Signed)
[name], female    DOB: May 26, 1956    MRN: [id number]  Brief patient profile:  42  yowf  quit smoking 2015 referred to pulmonary clinic in [place]  09/10/2022 by [facility]  for post hosp f/u    Pt of Dr [name] with copd GOLD 4  criteria and housbound at baseline/ 02 dep / pred dep at 5 mg with freq exac and "just calls for appt" when flares. 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    History of Present Illness  09/10/2022  Pulmonary/ 1st office eval/ [name] / [place] [facility] trelegy 100 and pred 5 mg  Chief Complaint  Patient presents with   Consult    HFU from [place] patient sees Pulmonary @ [facility]    Dyspnea:  room to room ok / walks to car 25 ft/ walker /02  3lpm  Cough: rattling / slt green never turned clear most the time it stays that way  Sleep: flat bed with a bunch of pillows  SABA use: avg use now is none but no feel for max / neb even less  Rec Plan A = Automatic = Always=   Breztri in place of trelegy 100  >>> Take 2 puffs first thing in am and then another 2 puffs about 12 hours later and Prednisone 5 mg   Work on inhaler technique: Plan B = Backup (to supplement plan A, not to replace it) Only use your albuterol inhaler as a rescue medication   Plan C = Crisis (instead of Plan B but only if Plan B stops working) - only use your albuterol nebulizer if you first try Plan B Plan D = Deltasone  If ABC not working take 20 mg prednisone until better then wean down to  5 mg daily  Make sure you check your oxygen saturation  AT  your highest level of activity (not after you stop)   to be sure it stays over 90%  Levaquin 500 mg daily x 7 days  For cough/ congestion > mucinex 1200 mg  every 12 hours   For thrush> clotrimazole troche 10 mg up to  4 x daily     11/19/22 onc note:  ovarian ca  - CT CAP on 02/12/2021 showed peritoneal disease and abdominal lymph nodes have improved.  However new masslike areas in the left upper lobe, new since January 31 CT scan along with borderline enlargement of the left supraclavicular lymph node. - She underwent bronchoscopy and biopsy on 03/09/2021 which was benign. - Niraparib 200 mg daily started around 05/25/2021. - Germline mutation testing was negative. - Hospitalization from 10/03/2022 through 10/10/2022 with severe thrombocytopenia, anemia and leukopenia.  She received 1 unit of PRBC and G-CSF daily.  Niraparib held around 10/07/2022 > stopped all rx at that point due to low plts     11/21/2022  f/u ov/[place] office/[name] re: GOLD 4 / 02 dep   maint on breztri  / prednisone 5 mg floor "x years" Chief Complaint  Patient presents with   Hospitalization Follow-up    [facility] 12[facility]11[facility]12[facility] and 12/23-12/26  COPD exacerbation using 4-5LO2 pulse/ cont 24/7   Dyspnea:  room  to room  Cough: variably congested in am / has prn flutter/ no purulent sputum Sleeping: flat bed / lots of pillows  SABA use: not using as per abc action plan  02: 4lpm 24 /7 ok to 5lpm walking  Covid status: none  Rec Plan A = Automatic = Always=    breztri Take 2 puffs first thing in am and then another 2 puffs about 12 hours later.  Work on inhaler technique:    Plan B = Backup (to supplement plan A, not to replace it) Only use your albuterol inhaler as a rescue medication Plan C = Crisis (instead of Plan B but only if Plan B stops working) - only use your albuterol nebulizer if you first try Plan B Plan D = Deltasone  If ABC not working take 20 mg prednisone until better then wean down to  5 mg daily  Make sure you check your oxygen saturation  AT  your highest level of activity (not after you stop)   to be sure it stays over 90%  For cough/ congestion > mucinex  1200 mg every 12 hours as needed and use the flutter valve as need Make sure you check your oxygen saturation  AT  your highest level of activity (not after you stop)   to be sure it stays over 90%   Admit date: 01/02/2023 Discharge date: 01/03/2023 Recommendations for Outpatient Follow-up:  Follow up with PCP in 1-2 weeks Follow-up with outpatient palliative services Continue on prednisone daily for 5 days as prescribed Use home breathing treatments as needed Robitussin provided for cough or congestion   Home Health: None, will be set up with outpatient palliative Equipment/Devices: Has home 4 L nasal cannula Brief/Interim Summary:  [name] is a 67 y.o. female with medical history significant for ovarian cancer with peritoneal carcinomatosis, COPD with chronic hypoxemia on 4 L nasal cannula, and hypertension who presented to the ED with shortness of breath.  She has had increased need to use her nebulizer over the past week and has persistent, worsening cough with greenish sputum.  She was admitted with mild acute COPD exacerbation started on IV Solu-Medrol as well as scheduled breathing treatments with significant improvement noted this morning back to her usual baseline.  She was noted to have some worsening anemia that is a chronic issue for her and she follows up with hematology outpatient.  No need for transfusion noted and FOBT negative.  She is in stable condition for discharge at this point and will have resumption of her home palliative services.  She would likely need to transition to home hospice care in the very near future as her long-term prognosis is quite poor.   Discharge Diagnoses:  Principal Problem:   COPD with acute exacerbation ([place]) Active Problems:   Essential hypertension   Chronic respiratory failure with hypoxia ([facility])   Deficiency anemia   Dyslipidemia   Essential tremor   01/13/2023  post hosp f/u ov/[place] office/[name] re: GOLD 4/bronchiectasis maint on  breztri   needs alpha one AT screening  Chief Complaint  Patient presents with   Hospitalization Follow-up    Wants to discuss POC and adapt  [place] admission 2/15-2/16 for COPD exacerbation   Dyspnea:  room to room at home Cough: none  Sleeping: flat bed with pillows  SABA use: not using  02: 4lpm 24 /7  Covid status: vax x one/ never infected    No obvious day to day or daytime variability or assoc  excess/ purulent sputum or mucus plugs or hemoptysis or cp or chest tightness, subjective wheeze or overt sinus or hb symptoms.   Sleeping  without nocturnal  or early am exacerbation  of respiratory  c/o's or need for noct saba. Also denies any obvious fluctuation of symptoms with weather or environmental changes or other aggravating or alleviating factors except as outlined above   No unusual exposure hx or h/o childhood pna/ asthma or knowledge of premature birth.  Current Allergies, Complete Past Medical History, Past Surgical History, Family History, and Social History were reviewed in [employer] record.  ROS  The following are not active complaints unless bolded Hoarseness, sore throat, dysphagia, dental problems, itching, sneezing,  nasal congestion or discharge of excess mucus or purulent secretions, ear ache,   fever, chills, sweats, unintended wt loss or wt gain, classically pleuritic or exertional cp,  orthopnea pnd or arm/hand swelling  or leg swelling, presyncope, palpitations, abdominal pain, anorexia, nausea, vomiting, diarrhea  or change in bowel habits or change in bladder habits, change in stools or change in urine, dysuria, hematuria,  rash, arthralgias, visual complaints, headache, numbness, weakness or ataxia or problems with walking or coordination,  change in mood or  memory.        Current Meds  Medication Sig   albuterol (VENTOLIN HFA) 108 (90 Base) MCG/ACT inhaler Inhale 2 puffs into the lungs every 6 (six) hours as needed for wheezing or  shortness of breath.   amLODipine (NORVASC) 5 MG tablet Take 5 mg by mouth at bedtime.    aspirin EC 81 MG tablet Take 1 tablet (81 mg total) by mouth daily with breakfast.   atorvastatin (LIPITOR) 40 MG tablet Take 1 tablet (40 mg total) by mouth daily.   Budeson-Glycopyrrol-Formoterol (BREZTRI AEROSPHERE) 160-9-4.8 MCG/ACT AERO Inhale 2 puffs into the lungs 2 (two) times daily.   ferrous sulfate 325 (65 FE) MG tablet Take 325 mg by mouth every other day.   gabapentin (NEURONTIN) 600 MG tablet Take 1 tablet by mouth 2 (two) times daily.   guaiFENesin-dextromethorphan (ROBITUSSIN DM) 100-10 MG/5ML syrup Take 5 mLs by mouth every 4 (four) hours as needed for cough.   ipratropium (ATROVENT HFA) 17 MCG/ACT inhaler Inhale 2 puffs into the lungs every 6 (six) hours as needed for wheezing.   levalbuterol (XOPENEX) 1.25 MG/3ML nebulizer solution Take 1.25 mg by nebulization in the morning, at noon, and at bedtime.   magnesium oxide (MAG-OX) 400 (240 Mg) MG tablet Take 1 tablet (400 mg total) by mouth 2 (two) times daily.   morphine (MSIR) 15 MG tablet Use 1/2-1 tab every 4 hours as needed for shortness of breath.   niraparib tosylate (ZEJULA) 100 MG tablet Take 1 tablet (100 mg total) by mouth daily. May take at bedtime to reduce nausea and vomiting.   nortriptyline (PAMELOR) 25 MG capsule Take 25 mg by mouth at bedtime.   OXYGEN Inhale 3 L into the lungs continuous.   potassium chloride SA (KLOR-CON M) 20 MEQ tablet Take 1 tablet (20 mEq total) by mouth daily.   primidone (MYSOLINE) 50 MG tablet Take 50 mg by mouth at bedtime.             Objective:    Wts    01/13/2023       110   11/21/22 107 lb 12.8 oz (48.9 kg)  11/19/22 105 lb 12.8 oz (48 kg)  11/15/22 105 lb (47.6 kg)      Vital signs reviewed  01/13/2023  - Note at rest 02 sats  96% on 3lpm POC    General appearance:    w/c bound elderly wf / min rattling cough     HEENT :  Oropharynx  clear      NECK :  without JVD/Nodes/TM/  nl carotid upstrokes bilaterally   LUNGS: no acc muscle use,  Mod barrel  contour chest wall with a few bilateral insp crackles in bases  and  without cough on insp or exp maneuvers and mod  Hyperresonant  to  percussion bilaterally     CV:  RRR  no s3 or murmur or increase in P2, and no edema   ABD:  soft and nontender with pos mid insp Hoover's  in the supine position. No bruits or organomegaly appreciated, bowel sounds nl  MS:   Ext warm without deformities or   obvious joint restrictions , calf tenderness, cyanosis or clubbing  SKIN: warm and dry without lesions    NEURO:  alert, approp, nl sensorium with  no motor or cerebellar deficits apparent.          I personally reviewed images and agree with radiology impression as follows:  CXR:   portable 01/02/23 Chronic emphysematous lung changes. No acute cardiopulmonary findings.           Assessment

## 2023-01-13 NOTE — Patient Instructions (Addendum)
Pantoprazole 40 mg Take 30- 60 min before your first and last meals of the day    Plan A = Automatic = Always=    Breztri 2 puffs  Take 2 puffs first thing in am and then another 2 puffs about 12 hours later.    Work on inhaler technique:  relax and gently blow all the way out then take a nice smooth full deep breath back in, triggering the inhaler at same time you start breathing in.  Hold breath in for at least  5 seconds if you can. Blow out breztri  thru nose. Rinse and gargle with water when done.  If mouth or throat bother you at all,  try brushing teeth/gums/tongue with arm and hammer toothpaste/ make a slurry and gargle and spit out.      Plan B = Backup (to supplement plan A, not to replace it) Only use your albuterol inhaler as a rescue medication to be used if you can't catch your breath by resting or doing a relaxed purse lip breathing pattern.  - The less you use it, the better it will work when you need it. - Ok to use the inhaler up to 2 puffs  every 4 hours if you must but call for appointment if use goes up over your usual need - Don't leave home without it !!  (think of it like the spare tire for your car)   Plan C = Crisis (instead of Plan B but only if Plan B stops working) - only use your albuterol nebulizer if you first try Plan B and it fails to help > ok to use the nebulizer up to every 4 hours but if start needing it regularly call for immediate appointment   Plan D = Deltasone = Prednisone 5 mg  - if getting worse go ahead and incrase  to 20 mg right way daily until better then 10 mg per day x 5 days and then back to 5 mg   Make sure you check your oxygen saturation  AT  your highest level of activity (not after you stop)   to be sure it stays over 90% and adjust  02 flow upward to maintain this level if needed but remember to turn it back to previous settings when you stop (to conserve your supply).    Please schedule a follow up office visit in 4 weeks, sooner if  needed  with all medications /inhalers/ solutions in hand so we can verify exactly what you are taking. This includes all medications from all doctors and over the counters

## 2023-01-13 NOTE — Assessment & Plan Note (Signed)
HC03  08/15/22  = 30  - HC03  11/19/22 = 31  - HC03   01/03/22 = 30   Advised: Make sure you check your oxygen saturation  AT  your highest level of activity (not after you stop)   to be sure it stays over 90% and adjust  02 flow upward to maintain this level if needed but remember to turn it back to previous settings when you stop (to conserve your supply).          Each maintenance medication was reviewed in detail including emphasizing most importantly the difference between maintenance and prns and under what circumstances the prns are to be triggered using an action plan format where appropriate.  Total time for H and P, chart review, counseling, reviewing hfa/neb/02  device(s) and generating customized AVS unique to this office visit / same day charting  > 30 min post hosp f/u ov

## 2023-01-15 ENCOUNTER — Telehealth: Payer: Self-pay

## 2023-01-15 ENCOUNTER — Other Ambulatory Visit: Payer: 59

## 2023-01-15 DIAGNOSIS — Z515 Encounter for palliative care: Secondary | ICD-10-CM

## 2023-01-15 NOTE — Progress Notes (Signed)
COMMUNITY PALLIATIVE CARE SW NOTE  PATIENT NAME: [name] DOB: Sep 03, 1956 MRN: [id number]  PRIMARY CARE PROVIDER: [name], MD  RESPONSIBLE PARTY:  Acct ID - Guarantor Home Phone Work Phone Relationship Acct Type  [id number] - Guzzetta,[phone]  Self P/F     [address], [place], [place] [address]                                                                TELEPHONE ENCOUNTER   PLAN OF CARE and INTERVENTIONS:          Palliative care SW connected with Patient and spouse via telephone. Discussed and reviewed palliative care criteria and referral from [name]. SW advised patient that PC services will be vitual as, [facility] does not offer in person support outside of  and [employer]. Hospice vs palliative care discussed while in the hospital. Patient share that they desire to receive palliative care services over hospice.  ACP: full code  Medical hx: Recent hospitalization 2/15- 2/16 due to COPD exacerbation.   Ovarian cancer: receiving chemo Q12 days 6 cycles  Appetite: fair. Patient share that she has not been a big eater. She eats nearly 2 meals a day.  SOB: 4LPM cont.  Mobility: able to ambulate and complete ADL's (I).  Sleeping: sleeps well, no issues  In home support: patient resides with her boyfriend and daughter. Patient declines any additional in home support at this time. No HH was ordered post hospital DC, patient does not feel she needs HH at this time.  Plan/follow up: palliative care will continue to follow. Next TC/virtual visit to be completed in 2-3 weeks, patient in agreement.      SOCIAL HX:  Social History   Tobacco Use   Smoking status: Former    Packs/day: 2.00    Years: 30.00    Total pack years: 60.00    Types: Cigarettes    Quit date: 11/04/2013    Years since quitting: 9.2   Smokeless tobacco: Never  Substance Use Topics   Alcohol use: No    CODE STATUS: FULL CODE ADVANCED DIRECTIVES:  N MOST FORM COMPLETE: N HOSPICE EDUCATION PROVIDED: Y  PPS:50%  Time spent: 20 min      [name], [place]

## 2023-01-15 NOTE — Telephone Encounter (Signed)
  Return TC: Palliative care outreach to patient to complete initial PC consult.   Call unsuccessful. SW LVM

## 2023-01-16 ENCOUNTER — Ambulatory Visit (INDEPENDENT_AMBULATORY_CARE_PROVIDER_SITE_OTHER): Payer: Medicare HMO | Admitting: Gastroenterology

## 2023-01-17 ENCOUNTER — Ambulatory Visit: Payer: Medicare HMO | Admitting: Internal Medicine

## 2023-01-20 LAB — ALPHA-1-ANTITRYPSIN PHENOTYP: A-1 Antitrypsin: 165 mg/dL (ref 101–187)

## 2023-01-28 ENCOUNTER — Other Ambulatory Visit: Payer: Self-pay

## 2023-01-29 ENCOUNTER — Inpatient Hospital Stay: Payer: 59

## 2023-01-29 ENCOUNTER — Inpatient Hospital Stay: Payer: 59 | Attending: Hematology | Admitting: Hematology

## 2023-01-29 DIAGNOSIS — G629 Polyneuropathy, unspecified: Secondary | ICD-10-CM | POA: Diagnosis not present

## 2023-01-29 DIAGNOSIS — C569 Malignant neoplasm of unspecified ovary: Secondary | ICD-10-CM

## 2023-01-29 DIAGNOSIS — E876 Hypokalemia: Secondary | ICD-10-CM | POA: Insufficient documentation

## 2023-01-29 DIAGNOSIS — D649 Anemia, unspecified: Secondary | ICD-10-CM | POA: Diagnosis not present

## 2023-01-29 DIAGNOSIS — J449 Chronic obstructive pulmonary disease, unspecified: Secondary | ICD-10-CM | POA: Diagnosis not present

## 2023-01-29 DIAGNOSIS — C786 Secondary malignant neoplasm of retroperitoneum and peritoneum: Secondary | ICD-10-CM | POA: Insufficient documentation

## 2023-01-29 DIAGNOSIS — Z8042 Family history of malignant neoplasm of prostate: Secondary | ICD-10-CM | POA: Insufficient documentation

## 2023-01-29 DIAGNOSIS — Z87891 Personal history of nicotine dependence: Secondary | ICD-10-CM | POA: Diagnosis not present

## 2023-01-29 LAB — CBC WITH DIFFERENTIAL/PLATELET
Abs Immature Granulocytes: 0.03 10*3/uL (ref 0.00–0.07)
Basophils Absolute: 0 10*3/uL (ref 0.0–0.1)
Basophils Relative: 1 %
Eosinophils Absolute: 0.1 10*3/uL (ref 0.0–0.5)
Eosinophils Relative: 2 %
HCT: 28.5 % — ABNORMAL LOW (ref 36.0–46.0)
Hemoglobin: 8.9 g/dL — ABNORMAL LOW (ref 12.0–15.0)
Immature Granulocytes: 1 %
Lymphocytes Relative: 26 %
Lymphs Abs: 1.2 10*3/uL (ref 0.7–4.0)
MCH: 29.5 pg (ref 26.0–34.0)
MCHC: 31.2 g/dL (ref 30.0–36.0)
MCV: 94.4 fL (ref 80.0–100.0)
Monocytes Absolute: 0.6 10*3/uL (ref 0.1–1.0)
Monocytes Relative: 13 %
Neutro Abs: 2.6 10*3/uL (ref 1.7–7.7)
Neutrophils Relative %: 57 %
Platelets: 222 10*3/uL (ref 150–400)
RBC: 3.02 MIL/uL — ABNORMAL LOW (ref 3.87–5.11)
RDW: 17.1 % — ABNORMAL HIGH (ref 11.5–15.5)
WBC: 4.6 10*3/uL (ref 4.0–10.5)
nRBC: 0 % (ref 0.0–0.2)

## 2023-01-29 LAB — FERRITIN: Ferritin: 177 ng/mL (ref 11–307)

## 2023-01-29 LAB — IRON AND TIBC
Iron: 49 ug/dL (ref 28–170)
Saturation Ratios: 16 % (ref 10.4–31.8)
TIBC: 316 ug/dL (ref 250–450)
UIBC: 267 ug/dL

## 2023-01-29 LAB — COMPREHENSIVE METABOLIC PANEL
ALT: 13 U/L (ref 0–44)
AST: 26 U/L (ref 15–41)
Albumin: 3.4 g/dL — ABNORMAL LOW (ref 3.5–5.0)
Alkaline Phosphatase: 79 U/L (ref 38–126)
Anion gap: 10 (ref 5–15)
BUN: 9 mg/dL (ref 8–23)
CO2: 29 mmol/L (ref 22–32)
Calcium: 8.6 mg/dL — ABNORMAL LOW (ref 8.9–10.3)
Chloride: 98 mmol/L (ref 98–111)
Creatinine, Ser: 0.68 mg/dL (ref 0.44–1.00)
GFR, Estimated: >60 mL/min (ref >60)
Glucose, Bld: 95 mg/dL (ref 70–99)
Potassium: 3.4 mmol/L — ABNORMAL LOW (ref 3.5–5.1)
Sodium: 137 mmol/L (ref 135–145)
Total Bilirubin: 0.4 mg/dL (ref 0.3–1.2)
Total Protein: 6.1 g/dL — ABNORMAL LOW (ref 6.5–8.1)

## 2023-01-29 NOTE — Progress Notes (Signed)
[facility] [address] [place] [address]    Clinic Day:  01/29/2023  Referring physician: [name], MD  Patient Care Team: Vicki Richards, MD as PCP - General (Family Medicine) Derek Jack, MD as Medical Oncologist (Medical Oncology) Derek Jack, MD as Consulting Physician (Hematology)   ASSESSMENT & PLAN:   Assessment: 1.  Peritoneal carcinomatosis: -Presentation to the ER with right lower quadrant abdominal pain on and off for 1 month. -CTAP on 05/29/2020 showed extensive nodularity throughout the omentum and upper peritoneal cavity.  Both ovaries are prominent although no well-defined mass is noted.  Small volume ascites. -CA-125 is elevated at 2407.  CEA was 2.4. -CT of the chest shows 11 mm right paratracheal lymph node and 11 mm short axis precarinal lymph node.  Subcarinal lymphadenopathy measuring 14 mm short axis.  This is suspicious for metastatic disease. 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 -Cycle 1 of carboplatin and paclitaxel on 07/05/2020. -Evaluated by Dr. Denman George on 07/07/2020.  Reevaluation after 3 cycles. -CT CAP from 09/01/2020 showed mild improvement with reduction in the omental caking.  Stable to minimally reduced adenopathy in the chest and abdomen.  New 4 mm right upper lobe lung nodule could be inflammatory. -CT CAP on 11/07/2019 and after 6 cycles showed improved peritoneal disease/omental caking.  9 mm short axis portacaval lymph node and adjacent upper abdominal lymph nodes measuring 12 mm in short axis.  Small retroperitoneal lymph nodes up to 5 mm improved.  Peritoneal disease/omental caking now measures 2.4 cm in thickness. -CA-125 on 10/18/2020 improved 52. -Plan for surgical resection after 6 cycles.  Unfortunately she was not cleared for surgery by  anesthesia/pulmonary. - CT CAP on 02/12/2021 showed peritoneal disease and abdominal lymph nodes have improved.  However new masslike areas in the left upper lobe, new since January 31 CT scan along with borderline enlargement of the left supraclavicular lymph node. - She underwent bronchoscopy and biopsy on 03/09/2021 which was benign. - Niraparib 200 mg daily started around 05/25/2021. - Germline mutation testing was negative. - Hospitalization from 10/03/2022 through 10/10/2022 with severe thrombocytopenia, anemia and leukopenia.  She received 1 unit of PRBC and G-CSF daily.  Niraparib held around November 2023. - Niraparib 100 mg daily started back on 12/02/2021.  Plan: 1.  High-grade serous ovarian carcinoma: - She is taking niraparib 100 mg daily. - She was recently hospitalized for COPD exacerbation from 01/02/2023 through 01/03/2023. - She denies any bleeding per rectum or melena. - Reviewed labs today which showed normal LFTs.  Creatinine is normal.  CBC shows hemoglobin 8.9.  CA125 from today is pending.  Last CA125 on 12/30/2022 was 81.5, up from 65 on 11/19/2022. - CT scan of the abdomen and pelvis reviewed by me from January 2024 showed no signs of recurrence. - Continue with niraparib 100 mg daily at this time. - RTC 6 weeks for follow-up with repeat CA125 and CTAP with contrast.    2.  Normocytic anemia: - Hemoglobin today is 8.9.  Last ferritin was elevated. - Continue iron tablet daily.  Will check ferritin, iron panel, [id number], folic acid and MMA prior to next visit.   3.  Peripheral neuropathy: - Continue primidone which is helping.   4.  Severe COPD: - Continue Breztri.  Continue follow-up with Dr. [name].   5.  Hypomagnesemia/hypokalemia: - Continue magnesium twice daily and potassium  20 mEq once daily.    Orders Placed This Encounter  Procedures   CT Abdomen Pelvis W Contrast    Standing Status:   Future    Standing Expiration Date:   01/29/2024    Order Specific Question:    If indicated for the ordered procedure, I authorize the administration of contrast media per Radiology protocol    Answer:   Yes    Order Specific Question:   Does the patient have a contrast media/X-ray dye allergy?    Answer:   No    Order Specific Question:   Preferred imaging location?    Answer:   [facility]    Order Specific Question:   Release to patient    Answer:   Immediate [1]    Order Specific Question:   Is Oral Contrast requested for this exam?    Answer:   Yes, Per Radiology protocol   CA 125    Standing Status:   Future    Standing Expiration Date:   01/29/2024   CBC with Differential/Platelet    Standing Status:   Future    Standing Expiration Date:   01/29/2024    Order Specific Question:   Release to patient    Answer:   Immediate   Ferritin    Standing Status:   Future    Standing Expiration Date:   01/29/2024    Order Specific Question:   Release to patient    Answer:   Immediate   Iron and TIBC    Standing Status:   Future    Standing Expiration Date:   01/29/2024    Order Specific Question:   Release to patient    Answer:   Immediate   Comprehensive metabolic panel    Standing Status:   Future    Standing Expiration Date:   01/29/2024    Order Specific Question:   Release to patient    Answer:   Immediate   Magnesium    Standing Status:   Future    Standing Expiration Date:   01/29/2024    Order Specific Question:   Release to patient    Answer:   Immediate   Vitamin B12    Standing Status:   Future    Standing Expiration Date:   01/29/2024    Order Specific Question:   Release to patient    Answer:   Immediate   Folate    Standing Status:   Future    Standing Expiration Date:   01/29/2024    Order Specific Question:   Release to patient    Answer:   Immediate   Methylmalonic acid, serum    Standing Status:   Future    Standing Expiration Date:   01/29/2024     I,[name],acting as a scribe for [employer], MD.,have  documented all relevant documentation on the behalf of Derek Jack, MD,as directed by  Derek Jack, MD while in the presence of Derek Jack, MD.  I, Derek Jack MD, have reviewed the above documentation for accuracy and completeness, and I agree with the above.   Derek Jack, MD   3/13/20246:46 PM  CHIEF COMPLAINT:   Diagnosis: high-grade serous ovarian carcinoma    Cancer Staging  No matching staging information was found for the patient.   Prior Therapy: 1.  Carboplatin and paclitaxel   Current Therapy:   Niraparib    HISTORY OF PRESENT ILLNESS:   Oncology History  Peritoneal carcinomatosis ([place])  06/06/2020 Initial  Diagnosis   Peritoneal carcinomatosis ([place])   07/05/2020 - 05/04/2021 Chemotherapy   Patient is on Treatment Plan : OVARIAN Carboplatin (AUC 6) / Paclitaxel (175) q21d x 6 cycles     07/22/2020 Genetic Testing   Negative genetic testing: no pathogenic variants detected in [place] Expanded CancerNext Panel + RNAInsight. The CancerNext-Expanded gene panel offered by [facility] and includes sequencing and rearrangement analysis for the following 77 genes: AIP, ALK, APC*, ATM*, AXIN2, BAP1, BARD1, BLM, BMPR1A, BRCA1*, BRCA2*, BRIP1*, CDC73, CDH1*, CDK4, CDKN1B, CDKN2A, CHEK2*, CTNNA1, DICER1, FANCC, FH, FLCN, GALNT12, KIF1B, LZTR1, MAX, MEN1, MET, MLH1*, MSH2*, MSH3, MSH6*, MUTYH*, NBN, NF1*, NF2, NTHL1, PALB2*, PHOX2B, PMS2*, POT1, PRKAR1A, PTCH1, PTEN*, RAD51C*, RAD51D*, RB1, RECQL, RET, SDHA, SDHAF2, SDHB, SDHC, SDHD, SMAD4, SMARCA4, SMARCB1, SMARCE1, STK11, SUFU, TMEM127, TP53*, TSC1, TSC2, VHL and XRCC2 (sequencing and deletion/duplication); EGFR, EGLN1, HOXB13, KIT, MITF, PDGFRA, POLD1, and POLE (sequencing only); EPCAM and GREM1 (deletion/duplication only). DNA and RNA analyses performed for * genes. The report date is July 22, 2020.    Ovarian cancer ([place])  07/07/2020 Initial Diagnosis   Ovarian cancer ([place])   05/04/2021  Imaging   1. No findings today to suggest new or progressive disease related to ovarian cancer. 2. Interval near complete resolution of the irregular and nodular areas of consolidative left upper lobe opacity seen on the previous study. Residual architectural distortion in this region is compatible with scarring. Tiny nodules persist in the left upper lobe and attention on follow-up recommended. 3. Interval improvement in tree-in-bud nodularity anteromedial left upper lobe. Changes of bronchial wall thickening and mild cylindrical bronchiectasis in the lower lungs are similar to prior with persistent tree-in-bud nodularity posterior left lower lobe. Imaging features suggest sequelae of atypical infection. 4. Similar appearance of the omental soft tissue stranding with less prominent nodularity on today's study. 5. No ascites. 6. Mild circumferential wall thickening distal esophagus. Esophagitis could have this appearance. 7. Left colonic diverticulosis without diverticulitis. 8. Aortic Atherosclerosis (ICD10-I70.0) and Emphysema (ICD10-J43.9).   05/30/2021 -  Chemotherapy   She takes niraparib          INTERVAL HISTORY:   [name] is a 67 y.o. female presenting to clinic today for follow up of high-grade serous ovarian carcinoma. She was last seen by me on 12/31/22.  Today, she states that she is doing well overall. Her appetite level is at 75%. Her energy level is at 60%.  Denies any bleeding per rectum or melena.  No GI symptoms from niraparib.   PAST MEDICAL HISTORY:   Past Medical History: Past Medical History:  Diagnosis Date   Anemia    Aortic atherosclerosis (HCC)    Arthritis    Asthma    Bilateral carotid artery stenosis    Cancer (HCC)    Gastric Cancer   COPD (chronic obstructive pulmonary disease) (HCC)    High cholesterol    Hyperlipidemia    Hypertension    Neuropathy    Osteoarthritis    Ovarian cancer ([place])    Oxygen dependent    Peripheral vascular disease  ([place])    Peritoneal carcinoma ([place])    Pneumonia 2019   Port-A-Cath in place 07/03/2020   Pulmonary emphysema [facility])     Surgical History: Past Surgical History:  Procedure Laterality Date   BIOPSY  06/30/2022   Procedure: BIOPSY;  Surgeon: Eloise Harman, DO;  [facility];  Service: Endoscopy;;  gastric   BIOPSY  09/03/2022   Procedure: BIOPSY;  Surgeon: Harvel Quale,  MD;  [facility];  Service: Gastroenterology;;   COLONOSCOPY WITH PROPOFOL N/A 09/03/2022   Procedure: COLONOSCOPY WITH PROPOFOL;  Surgeon: Harvel Quale, MD;  [facility];  Service: Gastroenterology;  Laterality: N/A;  900 ASA 3   ESOPHAGEAL BRUSHING  09/03/2022   Procedure: ESOPHAGEAL BRUSHING;  Surgeon: Montez Morita, Quillian Quince, MD;  [facility];  Service: Gastroenterology;;   ESOPHAGOGASTRODUODENOSCOPY (EGD) WITH PROPOFOL N/A 02/17/2021   Procedure: ESOPHAGOGASTRODUODENOSCOPY (EGD) WITH PROPOFOL;  Surgeon: Harvel Quale, MD;  [facility];  Service: Gastroenterology;  Laterality: N/A;   ESOPHAGOGASTRODUODENOSCOPY (EGD) WITH PROPOFOL N/A 05/04/2022   Procedure: ESOPHAGOGASTRODUODENOSCOPY (EGD) WITH PROPOFOL;  Surgeon: Daneil Dolin, MD;  [facility];  Service: Endoscopy;  Laterality: N/A;   ESOPHAGOGASTRODUODENOSCOPY (EGD) WITH PROPOFOL N/A 06/30/2022   Procedure: ESOPHAGOGASTRODUODENOSCOPY (EGD) WITH PROPOFOL;  Surgeon: Eloise Harman, DO;  [facility];  Service: Endoscopy;  Laterality: N/A;   ESOPHAGOGASTRODUODENOSCOPY (EGD) WITH PROPOFOL N/A 09/03/2022   Procedure: ESOPHAGOGASTRODUODENOSCOPY (EGD) WITH PROPOFOL;  Surgeon: Harvel Quale, MD;  [facility];  Service: Gastroenterology;  Laterality: N/A;   HALLUX VALGUS BASE WEDGE Right 06/09/2015   Procedure: Base wedge osteotomy with modified McBride right foot ;  Surgeon: Sharlotte Alamo, MD;  Location: [facility];  Service: Podiatry;   Laterality: Right;   PORTACATH PLACEMENT Left 06/28/2020   Procedure: PORT-A-CATHETER PLACEMENT LEFT CHEST (attached catheter in left subclavian);  Surgeon: Virl Cagey, MD;  Location: [facility];  Service: General;  Laterality: Left;   TUBAL LIGATION     VIDEO BRONCHOSCOPY WITH ENDOBRONCHIAL NAVIGATION N/A 03/09/2021   Procedure: VIDEO BRONCHOSCOPY WITH ENDOBRONCHIAL NAVIGATION;  Surgeon: Ottie Glazier, MD;  Location: [facility];  Service: Thoracic;  Laterality: N/A;   VIDEO BRONCHOSCOPY WITH ENDOBRONCHIAL ULTRASOUND N/A 03/09/2021   Procedure: VIDEO BRONCHOSCOPY WITH ENDOBRONCHIAL ULTRASOUND;  Surgeon: Ottie Glazier, MD;  Location: [facility];  Service: Thoracic;  Laterality: N/A;    Social History: Social History   Socioeconomic History   Marital status: Divorced    Spouse name: Not on file   Number of children: 3   Years of education: Not on file   Highest education level: Not on file  Occupational History   Occupation: DISABLED  Tobacco Use   Smoking status: Former    Packs/day: 2.00    Years: 30.00    Total pack years: 60.00    Types: Cigarettes    Quit date: 11/04/2013    Years since quitting: 9.2   Smokeless tobacco: Never  Vaping Use   Vaping Use: Never used  Substance and Sexual Activity   Alcohol use: No   Drug use: No   Sexual activity: Not Currently  Other Topics Concern   Not on file  Social History Narrative   Not on file   Social Determinants of Health   Financial Resource Strain: Low Risk  (09/27/2020)   Overall Financial Resource Strain (CARDIA)    Difficulty of Paying Living Expenses: Not hard at all  Food Insecurity: No Food Insecurity (01/02/2023)   Hunger Vital Sign    Worried About Running Out of Food in the Last Year: Never true    [place] in the Last Year: Never true  Transportation Needs: No Transportation Needs (01/02/2023)   PRAPARE - Hydrologist (Medical): No    Lack of Transportation (Non-Medical):  No  Physical Activity: Inactive (09/27/2020)   Exercise Vital Sign  Days of Exercise per Week: 0 days    Minutes of Exercise per Session: 0 min  Stress: No Stress Concern Present (09/27/2020)   [place]    Feeling of Stress : Not at all  Social Connections: Socially Isolated (09/27/2020)   Social Connection and Isolation Panel [NHANES]    Frequency of Communication with Friends and Family: More than three times a week    Frequency of Social Gatherings with Friends and Family: Never    Attends Religious Services: Never    Marine scientist or Organizations: No    Attends Archivist Meetings: Never    Marital Status: Divorced  Human resources officer Violence: Not At Risk (01/02/2023)   Humiliation, Afraid, Rape, and Kick questionnaire    Fear of Current or Ex-Partner: No    Emotionally Abused: No    Physically Abused: No    Sexually Abused: No    Family History: Family History  Problem Relation Age of Onset   Alzheimer's disease Mother    COPD Father    Emphysema Father    Hypertension Father    Healthy Sister    Healthy Brother    Alzheimer's disease Maternal Grandmother    Healthy Sister    Healthy Sister    Healthy Sister    Prostate cancer Other        paternal grandmother's brother; dx in early 16s   Breast cancer Neg Hx     Current Medications:  Current Outpatient Medications:    albuterol (VENTOLIN HFA) 108 (90 Base) MCG/ACT inhaler, Inhale 2 puffs into the lungs every 6 (six) hours as needed for wheezing or shortness of breath., Disp: 18 g, Rfl: 3   amLODipine (NORVASC) 5 MG tablet, Take 5 mg by mouth at bedtime. , Disp: , Rfl:    aspirin EC 81 MG tablet, Take 1 tablet (81 mg total) by mouth daily with breakfast., Disp: 30 tablet, Rfl: 11   atorvastatin (LIPITOR) 40 MG tablet, Take 1 tablet (40 mg total) by mouth daily., Disp: 30 tablet, Rfl: 1   Budeson-Glycopyrrol-Formoterol (BREZTRI  AEROSPHERE) 160-9-4.8 MCG/ACT AERO, Inhale 2 puffs into the lungs 2 (two) times daily., Disp: 10.7 g, Rfl: 11   ferrous sulfate 325 (65 FE) MG tablet, Take 325 mg by mouth every other day., Disp: , Rfl:    gabapentin (NEURONTIN) 600 MG tablet, Take 1 tablet by mouth 2 (two) times daily., Disp: , Rfl:    guaiFENesin-dextromethorphan (ROBITUSSIN DM) 100-10 MG/5ML syrup, Take 5 mLs by mouth every 4 (four) hours as needed for cough., Disp: 118 mL, Rfl: 0   ipratropium (ATROVENT HFA) 17 MCG/ACT inhaler, Inhale 2 puffs into the lungs every 6 (six) hours as needed for wheezing., Disp: , Rfl:    levalbuterol (XOPENEX) 1.25 MG/3ML nebulizer solution, Take 1.25 mg by nebulization in the morning, at noon, and at bedtime., Disp: 72 mL, Rfl: 1   magnesium oxide (MAG-OX) 400 (240 Mg) MG tablet, Take 1 tablet (400 mg total) by mouth 2 (two) times daily., Disp: 120 tablet, Rfl: 3   morphine (MSIR) 15 MG tablet, Use 1/2-1 tab every 4 hours as needed for shortness of breath., Disp: 20 tablet, Rfl: 0   niraparib tosylate (ZEJULA) 100 MG tablet, Take 1 tablet (100 mg total) by mouth daily. May take at bedtime to reduce nausea and vomiting., Disp: 30 tablet, Rfl: 4   nortriptyline (PAMELOR) 25 MG capsule, Take 25 mg by mouth at bedtime., Disp: ,  Rfl:    OXYGEN, Inhale 3 L into the lungs continuous., Disp: , Rfl:    potassium chloride SA (KLOR-CON M) 20 MEQ tablet, Take 1 tablet (20 mEq total) by mouth daily., Disp: 30 tablet, Rfl: 6   primidone (MYSOLINE) 50 MG tablet, Take 50 mg by mouth at bedtime., Disp: , Rfl:    pantoprazole (PROTONIX) 40 MG tablet, Take 1 tablet (40 mg total) by mouth 2 (two) times daily. (Patient taking differently: Take 40 mg by mouth daily.), Disp: 180 tablet, Rfl: 3   Allergies: No Known Allergies  REVIEW OF SYSTEMS:   Review of Systems  Constitutional:  Negative for chills, fatigue and fever.  HENT:   Negative for lump/mass, mouth sores, nosebleeds, sore throat and trouble swallowing.    Eyes:  Negative for eye problems.  Respiratory:  Positive for cough and shortness of breath.   Cardiovascular:  Negative for chest pain, leg swelling and palpitations.  Gastrointestinal:  Negative for abdominal pain, constipation, diarrhea, nausea and vomiting.  Genitourinary:  Negative for bladder incontinence, difficulty urinating, dysuria, frequency, hematuria and nocturia.   Musculoskeletal:  Negative for arthralgias, back pain, flank pain, myalgias and neck pain.  Skin:  Negative for itching and rash.  Neurological:  Negative for dizziness, headaches and numbness.  Hematological:  Does not bruise/bleed easily.  Psychiatric/Behavioral:  Negative for depression, sleep disturbance and suicidal ideas. The patient is not nervous/anxious.   All other systems reviewed and are negative.    VITALS:   There were no vitals taken for this visit.  Wt Readings from Last 3 Encounters:  01/13/23 110 lb 3.2 oz (50 kg)  01/02/23 111 lb 11.2 oz (50.7 kg)  12/31/22 111 lb 1.6 oz (50.4 kg)    There is no height or weight on file to calculate BMI.  Performance status (ECOG): 1 - Symptomatic but completely ambulatory  PHYSICAL EXAM:   Physical Exam Vitals and nursing note reviewed. Exam conducted with a chaperone present.  Constitutional:      Appearance: Normal appearance.  Cardiovascular:     Rate and Rhythm: Normal rate and regular rhythm.     Pulses: Normal pulses.     Heart sounds: Normal heart sounds.  Pulmonary:     Effort: Pulmonary effort is normal.     Breath sounds: Normal breath sounds.  Abdominal:     Palpations: Abdomen is soft. There is no hepatomegaly, splenomegaly or mass.     Tenderness: There is no abdominal tenderness.  Musculoskeletal:     Right lower leg: No edema.     Left lower leg: No edema.  Lymphadenopathy:     Cervical: No cervical adenopathy.     Right cervical: No superficial, deep or posterior cervical adenopathy.    Left cervical: No superficial, deep or  posterior cervical adenopathy.     Upper Body:     Right upper body: No supraclavicular or axillary adenopathy.     Left upper body: No supraclavicular or axillary adenopathy.  Neurological:     General: No focal deficit present.     Mental Status: She is alert and oriented to person, place, and time.  Psychiatric:        Mood and Affect: Mood normal.        Behavior: Behavior normal.     LABS:      Latest Ref Rng & Units 01/29/2023    1:36 PM 01/03/2023    8:33 AM 01/03/2023    4:03 AM  CBC  WBC 4.0 -  10.5 K/uL 4.6   3.3   Hemoglobin 12.0 - 15.0 g/dL 8.9  7.5  7.4   Hematocrit 36.0 - 46.0 % 28.5  24.1  23.8   Platelets 150 - 400 K/uL 222   214       Latest Ref Rng & Units 01/29/2023    1:36 PM 01/03/2023    4:03 AM 01/02/2023   10:35 AM  CMP  Glucose 70 - 99 mg/dL 95  139  105   BUN 8 - 23 mg/dL '9  24  22   '$ Creatinine 0.44 - 1.00 mg/dL 0.68  0.65  0.64   Sodium 135 - 145 mmol/L 137  137  137   Potassium 3.5 - 5.1 mmol/L 3.4  4.6  3.2   Chloride 98 - 111 mmol/L 98  99  96   CO2 22 - 32 mmol/L 29  30  33   Calcium 8.9 - 10.3 mg/dL 8.6  8.5  8.0   Total Protein 6.5 - 8.1 g/dL 6.1     Total Bilirubin 0.3 - 1.2 mg/dL 0.4     Alkaline Phos 38 - 126 U/L 79     AST 15 - 41 U/L 26     ALT 0 - 44 U/L 13        Lab Results  Component Value Date   CEA1 2.4 05/29/2020   /  CEA  Date Value Ref Range Status  05/29/2020 2.4 0.0 - 4.7 ng/mL Final    Comment:    (NOTE)                             Nonsmokers          <3.9                             Smokers             <5.6 Roche [employer] Electrochemiluminescence Immunoassay (ECLIA) Values obtained with different assay methods or kits cannot be used interchangeably.  Results cannot be interpreted as absolute evidence of the presence or absence of malignant disease. Performed At: [facility] [place], [place] [id number] Rush Farmer MD [id number]    No results found for: "PSA1" Lab Results   Component Value Date   CAN199 <2 05/29/2020   Lab Results  Component Value Date   CAN125 81.5 (H) 12/30/2022    No results found for: "TOTALPROTELP", "ALBUMINELP", "A1GS", "A2GS", "BETS", "BETA2SER", "GAMS", "MSPIKE", "SPEI" Lab Results  Component Value Date   TIBC 316 01/29/2023   TIBC 332 11/19/2022   TIBC 315 08/15/2022   FERRITIN 177 01/29/2023   FERRITIN 2,376 (H) 11/19/2022   FERRITIN 761 (H) 08/15/2022   IRONPCTSAT 16 01/29/2023   IRONPCTSAT 18 11/19/2022   IRONPCTSAT 23 08/15/2022   Lab Results  Component Value Date   LDH 193 (H) 08/27/2022    STUDIES:   DG Chest Port 1 View  Result Date: 01/02/2023 CLINICAL DATA:  Cough, sepsis EXAM: PORTABLE CHEST 1 VIEW COMPARISON:  11/15/2022 FINDINGS: Left chest port remains in place. Normal heart size. Aortic atherosclerosis. Chronic emphysematous lung changes with coarsened interstitial markings, most notably in the lung bases and at the left lung apex. No superimposed airspace consolidation. No pleural effusion or pneumothorax. IMPRESSION: Chronic emphysematous lung changes. No acute cardiopulmonary findings. Electronically Signed   By: Davina Poke D.O.   On: 01/02/2023 10:01

## 2023-01-29 NOTE — Patient Instructions (Addendum)
[place]  Discharge Instructions  You were seen and examined today by [name].  [name] discussed your most recent lab work and CT scan which revealed that everything that is back so far looks good. We will call if anything comes back bad.  Continue taking the Zejula as prescribed. [name] is going to order a repeat Ct of your Abdomen and Pelvis.   Follow-up as scheduled in 6 weeks with labs and scan prior.    Thank you for choosing [facility] to provide your oncology and hematology care.   To afford each patient quality time with our provider, please arrive at least 15 minutes before your scheduled appointment time. You may need to reschedule your appointment if you arrive late (10 or more minutes). Arriving late affects you and other patients whose appointments are after yours.  Also, if you miss three or more appointments without notifying the office, you may be dismissed from the clinic at the provider's discretion.    Again, thank you for choosing [facility].  Our hope is that these requests will decrease the amount of time that you wait before being seen by our physicians.   If you have a lab appointment with the [facility] please come in thru the Main Entrance and check in at the main information desk.           _____________________________________________________________  Should you have questions after your visit to [facility], please contact our office at [phone] and follow the prompts.  Our office hours are 8:00 a.m. to 4:30 p.m. Monday - Thursday and 8:00 a.m. to 2:30 p.m. Friday.  Please note that voicemails left after 4:00 p.m. may not be returned until the following business day.  We are closed weekends and all major holidays.  You do have access to a nurse 24-7, just call the main number to the clinic [phone] and do not press any options, hold on the line  and a nurse will answer the phone.    For prescription refill requests, have your pharmacy contact our office and allow 72 hours.    Masks are optional in the cancer centers. If you would like for your care team to wear a mask while they are taking care of you, please let them know. You may have one support person who is at least 65 years old accompany you for your appointments.

## 2023-01-31 ENCOUNTER — Other Ambulatory Visit: Payer: Self-pay

## 2023-01-31 ENCOUNTER — Other Ambulatory Visit (HOSPITAL_COMMUNITY): Payer: Self-pay

## 2023-01-31 LAB — CA 125: Cancer Antigen (CA) 125: 107 U/mL — ABNORMAL HIGH (ref 0.0–38.1)

## 2023-02-06 ENCOUNTER — Telehealth: Payer: Self-pay

## 2023-02-06 NOTE — Telephone Encounter (Signed)
930 am.  Return call made to [place], Therapist, sports with Evlyn Clines.  Patient is actively being followed by the [facility] since January 8th.  [facility] will discharge patient from services.

## 2023-02-11 NOTE — Progress Notes (Unsigned)
[name], female    DOB: November 20, 1955    MRN: [id number]  Brief patient profile:  23  yowf quit smoking 2015/MM referred to pulmonary clinic in [place]  09/10/2022 by [facility]  for post hosp f/u    Pt of Dr Raul Del with copd GOLD 4  criteria and housbound at baseline/ 02 dep / pred dep at 5 mg with freq exac and "just calls for appt" when flares. 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    History of Present Illness  09/10/2022  Pulmonary/ 1st office eval/ [name] / [name] trelegy 100 and pred 5 mg  Chief Complaint  Patient presents with   Consult    HFU from [place] patient sees Pulmonary @ [facility]    Dyspnea:  room to room ok / walks to car 25 ft/ walker /02  3lpm  Cough: rattling / slt green never turned clear most the time it stays that way  Sleep: flat bed with a bunch of pillows  SABA use: avg use now is none but no feel for max / neb even less  Rec Plan A = Automatic = Always=   Breztri in place of trelegy 100  >>> Take 2 puffs first thing in am and then another 2 puffs about 12 hours later and Prednisone 5 mg   Work on inhaler technique: Plan B = Backup (to supplement plan A, not to replace it) Only use your albuterol inhaler as a rescue medication   Plan C = Crisis (instead of Plan B but only if Plan B stops working) - only use your albuterol nebulizer if you first try Plan B Plan D = Deltasone  If ABC not working take 20 mg prednisone until better then wean down to  5 mg daily  Make sure you check your oxygen saturation  AT  your highest level of activity (not after you stop)   to be sure it stays over 90%  Levaquin 500 mg daily x 7 days  For cough/ congestion > mucinex 1200 mg  every 12 hours   For thrush> clotrimazole troche 10 mg up to  4 x daily     11/19/22 onc note:  ovarian ca  - CT CAP on 02/12/2021 showed peritoneal disease and abdominal lymph nodes have improved.  However new masslike areas in the left upper lobe, new since January 31 CT scan along with borderline enlargement of the left supraclavicular lymph node. - She underwent bronchoscopy and biopsy on 03/09/2021 which was benign. - Niraparib 200 mg daily started around 05/25/2021. - Germline mutation testing was negative. - Hospitalization from 10/03/2022 through 10/10/2022 with severe thrombocytopenia, anemia and leukopenia.  She received 1 unit of PRBC and G-CSF daily.  Niraparib held around 10/07/2022 > stopped all rx at that point due to low plts     11/21/2022  f/u ov/[name] office/[name] re: GOLD 4 / 02 dep   maint on breztri  / prednisone 5 mg floor "x years" Chief Complaint  Patient presents with   Hospitalization Follow-up    AP 12/11-12/22 and 12/23-12/26  COPD exacerbation using 4-5LO2 pulse/ cont 24/7   Dyspnea:  room to  room  Cough: variably congested in am / has prn flutter/ no purulent sputum Sleeping: flat bed / lots of pillows  SABA use: not using as per abc action plan  02: 4lpm 24 /7 ok to 5lpm walking  Covid status: none  Rec Plan A = Automatic = Always=    breztri Take 2 puffs first thing in am and then another 2 puffs about 12 hours later.  Work on inhaler technique:    Plan B = Backup (to supplement plan A, not to replace it) Only use your albuterol inhaler as a rescue medication Plan C = Crisis (instead of Plan B but only if Plan B stops working) - only use your albuterol nebulizer if you first try Plan B Plan D = Deltasone  If ABC not working take 20 mg prednisone until better then wean down to  5 mg daily  Make sure you check your oxygen saturation  AT  your highest level of activity (not after you stop)   to be sure it stays over 90%  For cough/ congestion > mucinex  1200 mg every 12 hours as needed and use the flutter valve as need Make sure you check your oxygen saturation  AT  your highest level of activity (not after you stop)   to be sure it stays over 90%   Admit date: 01/02/2023 Discharge date: 01/03/2023 Recommendations for Outpatient Follow-up:  Follow up with PCP in 1-2 weeks Follow-up with outpatient palliative services Continue on prednisone daily for 5 days as prescribed Use home breathing treatments as needed Robitussin provided for cough or congestion   Home Health: None, will be set up with outpatient palliative Equipment/Devices: Has home 4 L nasal cannula Brief/Interim Summary:  [name] is a 67 y.o. female with medical history significant for ovarian cancer with peritoneal carcinomatosis, COPD with chronic hypoxemia on 4 L nasal cannula, and hypertension who presented to the ED with shortness of breath.  She has had increased need to use her nebulizer over the past week and has persistent, worsening cough with greenish sputum.  She was admitted with mild acute COPD exacerbation started on IV Solu-Medrol as well as scheduled breathing treatments with significant improvement noted this morning back to her usual baseline.  She was noted to have some worsening anemia that is a chronic issue for her and she follows up with hematology outpatient.  No need for transfusion noted and FOBT negative.  She is in stable condition for discharge at this point and will have resumption of her home palliative services.  She would likely need to transition to home hospice care in the very near future as her long-term prognosis is quite poor.   Discharge Diagnoses:  Principal Problem:   COPD with acute exacerbation ([name]) Active Problems:   Essential hypertension   Chronic respiratory failure with hypoxia (HCC)   Deficiency anemia   Dyslipidemia   Essential tremor   01/13/2023  post hosp f/u ov/[name] office/[name] re: GOLD 4/bronchiectasis maint on  breztri   needs alpha one AT screening  Chief Complaint  Patient presents with   Hospitalization Follow-up    Wants to discuss POC and adapt  [place] admission 2/15-2/16 for COPD exacerbation   Dyspnea:  room to room at home Cough: none  Sleeping: flat bed with pillows  SABA use: not using  02: 4lpm 24 /7  Covid status: vax x one/ never infected  Rec Pantoprazole 40 mg Take 30- 60 min before your first and last  meals of the day  Plan A = Automatic = Always=    Breztri 2 puffs  Take 2 puffs first thing in am and then another 2 puffs about 12 hours later.   Work on inhaler technique: Plan B = Backup (to supplement plan A, not to replace it) Only use your albuterol inhaler as a rescue medication  Plan C = Crisis (instead of Plan B but only if Plan B stops working) - only use your albuterol nebulizer if you first try Plan B  Plan D = Deltasone = Prednisone 5 mg  - if getting worse go ahead and incrase  to 20 mg right way daily until better then 10 mg per day x 5 days and then back to 5 mg  Make sure you check your oxygen saturation  AT  your highest level of activity (not after you stop)   to be sure it stays over 90% Please schedule a follow up office visit in 4 weeks, sooner if needed  with all medications /inhalers/ solutions in hand     02/12/2023  f/u ov/[place] office/Sakari Alkhatib re: GOLD 4/ bronchiectasis  maint on breztri and prednisone 5mg   floor  Chief Complaint  Patient presents with   Follow-up    Breathing doing well    Dyspnea:  room to room / only out for doctor visits  Cough: minimal slt green in pm  Sleeping: flat bed, 4 pillows  SABA use: very rarely  02: 4lpm 24/7     No obvious other day to day or daytime variability or assoc excess  mucus plugs or hemoptysis or cp or chest tightness, subjective wheeze or overt sinus or hb symptoms.   Sleeping  without nocturnal  or early am exacerbation  of respiratory  c/o's or need for noct saba. Also denies any obvious fluctuation  of symptoms with weather or environmental changes or other aggravating or alleviating factors except as outlined above   No unusual exposure hx or h/o childhood pna/ asthma or knowledge of premature birth.  Current Allergies, Complete Past Medical History, Past Surgical History, Family History, and Social History were reviewed in [employer] record.  ROS  The following are not active complaints unless bolded Hoarseness, sore throat, dysphagia, dental problems, itching, sneezing,  nasal congestion or discharge of excess mucus or purulent secretions, ear ache,   fever, chills, sweats, unintended wt loss or wt gain, classically pleuritic or exertional cp,  orthopnea pnd or arm/hand swelling  or leg swelling, presyncope, palpitations, abdominal pain, anorexia, nausea, vomiting, diarrhea  or change in bowel habits or change in bladder habits, change in stools or change in urine, dysuria, hematuria,  rash, arthralgias, visual complaints, headache, numbness, weakness or ataxia or problems with walking or coordination,  change in mood or  memory.        Current Meds - did not bring meds as requested/ not able to verify what she takes  Medication Sig   albuterol (VENTOLIN HFA) 108 (90 Base) MCG/ACT inhaler Inhale 2 puffs into the lungs every 6 (six) hours as needed for wheezing or shortness of breath.   amLODipine (NORVASC) 5 MG tablet Take 5 mg by mouth at bedtime.    aspirin EC 81 MG tablet Take 1 tablet (81 mg total) by mouth daily with breakfast.   atorvastatin (LIPITOR) 40 MG tablet Take 1 tablet (40 mg total) by mouth daily.   Budeson-Glycopyrrol-Formoterol (BREZTRI AEROSPHERE) 160-9-4.8 MCG/ACT AERO Inhale 2 puffs into the lungs 2 (two) times  daily.   ferrous sulfate 325 (65 FE) MG tablet Take 325 mg by mouth every other day.   gabapentin (NEURONTIN) 600 MG tablet Take 1 tablet by mouth 2 (two) times daily.   guaiFENesin-dextromethorphan (ROBITUSSIN DM) 100-10 MG/5ML syrup  Take 5 mLs by mouth every 4 (four) hours as needed for cough.   ipratropium (ATROVENT HFA) 17 MCG/ACT inhaler Inhale 2 puffs into the lungs every 6 (six) hours as needed for wheezing.   levalbuterol (XOPENEX) 1.25 MG/3ML nebulizer solution Take 1.25 mg by nebulization in the morning, at noon, and at bedtime.   magnesium oxide (MAG-OX) 400 (240 Mg) MG tablet Take 1 tablet (400 mg total) by mouth 2 (two) times daily.   morphine (MSIR) 15 MG tablet Use 1/2-1 tab every 4 hours as needed for shortness of breath.   niraparib tosylate (ZEJULA) 100 MG tablet Take 1 tablet (100 mg total) by mouth daily. May take at bedtime to reduce nausea and vomiting.   nortriptyline (PAMELOR) 25 MG capsule Take 25 mg by mouth at bedtime.   OXYGEN Inhale 3 L into the lungs continuous.   potassium chloride SA (KLOR-CON M) 20 MEQ tablet Take 1 tablet (20 mEq total) by mouth daily.   primidone (MYSOLINE) 50 MG tablet Take 50 mg by mouth at bedtime.               Objective:    Wts   02/12/2023      112  01/13/2023       110   11/21/22 107 lb 12.8 oz (48.9 kg)  11/19/22 105 lb 12.8 oz (48 kg)  11/15/22 105 lb (47.6 kg)    Vital signs reviewed  02/12/2023  - Note at rest 02 sats  98% on 2lpm Pulsed tank    General appearance:    frail chronically ill elderly wf w/c bound   HEENT :  Oropharynx  clear      NECK :  without JVD/Nodes/TM/ nl carotid upstrokes bilaterally   LUNGS: no acc muscle use,  Mod barrel  contour chest wall with bilateral  Distant bs s audible wheeze and  without cough on insp or exp maneuvers and mod  Hyperresonant  to  percussion bilaterally     CV:  RRR  no s3 or murmur or increase in P2, and no edema   ABD:  soft and nontender    MS:   Ext warm without deformities or   obvious joint restrictions , calf tenderness, cyanosis or clubbing  SKIN: warm and dry without lesions    NEURO:  alert, approp, nl sensorium with  no motor or cerebellar deficits apparent.                 Assessment

## 2023-02-12 ENCOUNTER — Ambulatory Visit: Payer: 59 | Admitting: Internal Medicine

## 2023-02-12 ENCOUNTER — Encounter: Payer: Self-pay | Admitting: Internal Medicine

## 2023-02-12 VITALS — BP 132/84 | HR 92 | Ht 60.0 in | Wt 112.0 lb

## 2023-02-12 DIAGNOSIS — J9621 Acute and chronic respiratory failure with hypoxia: Secondary | ICD-10-CM | POA: Diagnosis not present

## 2023-02-12 DIAGNOSIS — J9622 Acute and chronic respiratory failure with hypercapnia: Secondary | ICD-10-CM

## 2023-02-12 DIAGNOSIS — J449 Chronic obstructive pulmonary disease, unspecified: Secondary | ICD-10-CM | POA: Diagnosis not present

## 2023-02-12 MED ORDER — CEFDINIR 300 MG PO CAPS: 300.0000 mg | ORAL_CAPSULE | Freq: Two times a day (BID) | ORAL | 0 refills | Status: DC

## 2023-02-12 NOTE — Assessment & Plan Note (Signed)
HC03  08/15/22  = 30  - HC03  11/19/22 = 31  - HC03   01/03/22 = 30   Impoved on 4lpm 24/7 but did advise to adjust with highest level of activity with goal > 90%          Each maintenance medication was reviewed in detail including emphasizing most importantly the difference between maintenance and prns and under what circumstances the prns are to be triggered using an action plan format where appropriate.  Total time for H and P, chart review, counseling, reviewing hfa device(s) and generating customized AVS unique to this office visit / same day charting > 30 min

## 2023-02-12 NOTE — Patient Instructions (Addendum)
Pantoprazole 40 mg Take 30- 60 min before your first and last meals of the day    Plan A = Automatic = Always=    Breztri 2 puffs  Take 2 puffs first thing in am and then another 2 puffs about 12 hours later.    Work on inhaler technique:  relax and gently blow all the way out then take a nice smooth full deep breath back in, triggering the inhaler at same time you start breathing in.  Hold breath in for at least  5 seconds if you can. Blow out breztri  thru nose. Rinse and gargle with water when done.  If mouth or throat bother you at all,  try brushing teeth/gums/tongue with arm and hammer toothpaste/ make a slurry and gargle and spit out.  - use empty breztri to practice breathing in like golfers take practice swings      Plan B = Backup (to supplement plan A, not to replace it) Only use your albuterol inhaler as a rescue medication to be used if you can't catch your breath by resting or doing a relaxed purse lip breathing pattern.  - The less you use it, the better it will work when you need it. - Ok to use the inhaler up to 2 puffs  every 4 hours if you must but call for appointment if use goes up over your usual need - Don't leave home without it !!  (think of it like the spare tire for your car)   Plan C = Crisis (instead of Plan B but only if Plan B stops working) - only use your albuterol nebulizer if you first try Plan B and it fails to help > ok to use the nebulizer up to every 4 hours but if start needing it regularly call for immediate appointment   Plan D = Deltasone = Prednisone 5 mg  - if getting worse go ahead and incrase  to 20 mg right way daily until better then 10 mg per day x 5 days and then back to 5 mg   Make sure you check your oxygen saturation  AT  your highest level of activity (not after you stop)   to be sure it stays over 90% and adjust  02 flow upward to maintain this level if needed but remember to turn it back to previous settings when you stop (to conserve your  supply).   For green mucus > omnicef 300 mg twice daily x 10 days prn   Please schedule a follow up office visit in 6 weeks, sooner if needed  with all medications /inhalers/ solutions in hand so we can verify exactly what you are taking. This includes all medications from all doctors and over the counters

## 2023-02-12 NOTE — Assessment & Plan Note (Signed)
Quit smoking 2015/MM - pt of Dr Raul Del with copd GOLD 4  criteria and housbound at [place]/ 02 dep / pred dep at 5 mg with freq exac  - CTa  11/15/22 c/w bronchiectasis  - 11/21/2022   only using breztri in am > rec 2bid /flutter valve and pred ceiling of 20/ floor of 5  - Labs ordered 01/13/2023  :    alpha one AT phenotype  MM level 165  - 01/13/2023    continue breztri with pred floor of 5 mg daily  - 02/12/2023  After extensive coaching inhaler device,  effectiveness =    75% (short Ti) continue breztri with celing 20 and floor 10 mg per day pred to prevent readmit/ use omnicef x 10 d prn purulent sputum    Group D (now reclassified as E) in terms of symptom/risk and laba/lama/ICS  therefore appropriate rx at this point >>>  breztri/ pred and approp saba  Re SABA :  I spent extra time with pt today reviewing appropriate use of albuterol for prn use on exertion with the following points: 1) saba is for relief of sob that does not improve by walking a slower pace or resting but rather if the pt does not improve after trying this first. 2) If the pt is convinced, as many are, that saba helps recover from activity faster then it's easy to tell if this is the case by re-challenging : ie stop, take the inhaler, then p 5 minutes try the exact same activity (intensity of workload) that just caused the symptoms and see if they are substantially diminished or not after saba 3) if there is an activity that reproducibly causes the symptoms, try the saba 15 min before the activity on alternate days   If in fact the saba really does help, then fine to continue to use it prn but advised may need to look closer at the maintenance regimen being used to achieve better control of airways disease with exertion.

## 2023-02-26 ENCOUNTER — Other Ambulatory Visit (HOSPITAL_COMMUNITY): Payer: Self-pay

## 2023-02-27 ENCOUNTER — Other Ambulatory Visit: Payer: Self-pay

## 2023-02-28 ENCOUNTER — Telehealth: Payer: Self-pay | Admitting: Internal Medicine

## 2023-02-28 NOTE — Telephone Encounter (Signed)
Fine with me

## 2023-02-28 NOTE — Telephone Encounter (Signed)
Pt called the office stating that she has been talking with [facility] about switching her oxygen equipment over to them.  [facility] is needing to have an order sent over to them so they can be able to get her set up with their equipment.  Pt states that she uses 4L O2 24/7. States that she has the stationary home concentrator and also has the portable oxygen concentrator that she brings with her when she leaves the house.  Dr. Sherene Sires, please advise if you are okay with order being placed.

## 2023-03-03 ENCOUNTER — Other Ambulatory Visit: Payer: Self-pay

## 2023-03-03 ENCOUNTER — Emergency Department (HOSPITAL_COMMUNITY): Payer: 59

## 2023-03-03 ENCOUNTER — Ambulatory Visit (INDEPENDENT_AMBULATORY_CARE_PROVIDER_SITE_OTHER): Payer: Medicare HMO | Admitting: Gastroenterology

## 2023-03-03 ENCOUNTER — Ambulatory Visit (INDEPENDENT_AMBULATORY_CARE_PROVIDER_SITE_OTHER): Payer: 59 | Admitting: Gastroenterology

## 2023-03-03 ENCOUNTER — Encounter (HOSPITAL_COMMUNITY): Payer: Self-pay | Admitting: Emergency Medicine

## 2023-03-03 ENCOUNTER — Inpatient Hospital Stay (HOSPITAL_COMMUNITY)
Admission: EM | Admit: 2023-03-03 | Discharge: 2023-03-06 | DRG: 190 | Disposition: A | Discharge status: Home / Self Care | Payer: 59 | Attending: Internal Medicine | Admitting: Internal Medicine

## 2023-03-03 DIAGNOSIS — C786 Secondary malignant neoplasm of retroperitoneum and peritoneum: Secondary | ICD-10-CM | POA: Diagnosis present

## 2023-03-03 DIAGNOSIS — G629 Polyneuropathy, unspecified: Secondary | ICD-10-CM | POA: Diagnosis present

## 2023-03-03 DIAGNOSIS — J479 Bronchiectasis, uncomplicated: Secondary | ICD-10-CM | POA: Diagnosis present

## 2023-03-03 DIAGNOSIS — Z7982 Long term (current) use of aspirin: Secondary | ICD-10-CM

## 2023-03-03 DIAGNOSIS — Z87891 Personal history of nicotine dependence: Secondary | ICD-10-CM

## 2023-03-03 DIAGNOSIS — Z825 Family history of asthma and other chronic lower respiratory diseases: Secondary | ICD-10-CM

## 2023-03-03 DIAGNOSIS — J441 Chronic obstructive pulmonary disease with (acute) exacerbation: Secondary | ICD-10-CM | POA: Diagnosis not present

## 2023-03-03 DIAGNOSIS — J439 Emphysema, unspecified: Secondary | ICD-10-CM | POA: Diagnosis present

## 2023-03-03 DIAGNOSIS — J9621 Acute and chronic respiratory failure with hypoxia: Secondary | ICD-10-CM | POA: Diagnosis present

## 2023-03-03 DIAGNOSIS — J449 Chronic obstructive pulmonary disease, unspecified: Secondary | ICD-10-CM

## 2023-03-03 DIAGNOSIS — Z8249 Family history of ischemic heart disease and other diseases of the circulatory system: Secondary | ICD-10-CM

## 2023-03-03 DIAGNOSIS — I10 Essential (primary) hypertension: Secondary | ICD-10-CM | POA: Diagnosis not present

## 2023-03-03 DIAGNOSIS — I739 Peripheral vascular disease, unspecified: Secondary | ICD-10-CM | POA: Diagnosis present

## 2023-03-03 DIAGNOSIS — C569 Malignant neoplasm of unspecified ovary: Secondary | ICD-10-CM | POA: Diagnosis present

## 2023-03-03 DIAGNOSIS — J9601 Acute respiratory failure with hypoxia: Principal | ICD-10-CM

## 2023-03-03 DIAGNOSIS — E871 Hypo-osmolality and hyponatremia: Secondary | ICD-10-CM | POA: Diagnosis present

## 2023-03-03 DIAGNOSIS — Z7952 Long term (current) use of systemic steroids: Secondary | ICD-10-CM

## 2023-03-03 DIAGNOSIS — Z79899 Other long term (current) drug therapy: Secondary | ICD-10-CM

## 2023-03-03 DIAGNOSIS — Z820 Family history of epilepsy and other diseases of the nervous system: Secondary | ICD-10-CM

## 2023-03-03 DIAGNOSIS — Z9981 Dependence on supplemental oxygen: Secondary | ICD-10-CM

## 2023-03-03 DIAGNOSIS — E7800 Pure hypercholesterolemia, unspecified: Secondary | ICD-10-CM | POA: Diagnosis present

## 2023-03-03 DIAGNOSIS — G250 Essential tremor: Secondary | ICD-10-CM | POA: Diagnosis present

## 2023-03-03 DIAGNOSIS — Z7951 Long term (current) use of inhaled steroids: Secondary | ICD-10-CM

## 2023-03-03 LAB — CBC
HCT: 33.5 % — ABNORMAL LOW (ref 36.0–46.0)
Hemoglobin: 10.4 g/dL — ABNORMAL LOW (ref 12.0–15.0)
MCH: 28 pg (ref 26.0–34.0)
MCHC: 31 g/dL (ref 30.0–36.0)
MCV: 90.3 fL (ref 80.0–100.0)
Platelets: 219 10*3/uL (ref 150–400)
RBC: 3.71 MIL/uL — ABNORMAL LOW (ref 3.87–5.11)
RDW: 17.2 % — ABNORMAL HIGH (ref 11.5–15.5)
WBC: 10.1 10*3/uL (ref 4.0–10.5)
nRBC: 0.4 % — ABNORMAL HIGH (ref 0.0–0.2)

## 2023-03-03 LAB — BLOOD GAS, VENOUS
Acid-Base Excess: 10.8 mmol/L — ABNORMAL HIGH (ref 0.0–2.0)
Bicarbonate: 38.4 mmol/L — ABNORMAL HIGH (ref 20.0–28.0)
Drawn by: 66460
O2 Saturation: 74.5 %
Patient temperature: 36.7
pCO2, Ven: 67 mmHg — ABNORMAL HIGH (ref 44–60)
pH, Ven: 7.36 (ref 7.25–7.43)
pO2, Ven: 45 mmHg (ref 32–45)

## 2023-03-03 LAB — BASIC METABOLIC PANEL
Anion gap: 9 (ref 5–15)
BUN: 10 mg/dL (ref 8–23)
CO2: 32 mmol/L (ref 22–32)
Calcium: 8.6 mg/dL — ABNORMAL LOW (ref 8.9–10.3)
Chloride: 92 mmol/L — ABNORMAL LOW (ref 98–111)
Creatinine, Ser: 0.6 mg/dL (ref 0.44–1.00)
GFR, Estimated: >60 mL/min (ref >60)
Glucose, Bld: 123 mg/dL — ABNORMAL HIGH (ref 70–99)
Potassium: 3.9 mmol/L (ref 3.5–5.1)
Sodium: 133 mmol/L — ABNORMAL LOW (ref 135–145)

## 2023-03-03 LAB — MRSA NEXT GEN BY PCR, NASAL: MRSA by PCR Next Gen: NOT DETECTED

## 2023-03-03 LAB — GLUCOSE, CAPILLARY: Glucose-Capillary: 148 mg/dL — ABNORMAL HIGH (ref 70–99)

## 2023-03-03 MED ORDER — ENOXAPARIN SODIUM 30 MG/0.3ML IJ SOSY
30.0000 mg | PREFILLED_SYRINGE | INTRAMUSCULAR | Status: DC
  Administered 2023-03-03: 30 mg via SUBCUTANEOUS
  Filled 2023-03-03: qty 0.3

## 2023-03-03 MED ORDER — POLYETHYLENE GLYCOL 3350 17 G PO PACK: 17.0000 g | PACK | Freq: Every day | ORAL | Status: DC | PRN

## 2023-03-03 MED ORDER — ACETAMINOPHEN 500 MG PO TABS
1000.0000 mg | ORAL_TABLET | Freq: Once | ORAL | Status: AC
  Administered 2023-03-03: 1000 mg via ORAL
  Filled 2023-03-03: qty 2

## 2023-03-03 MED ORDER — IPRATROPIUM-ALBUTEROL 0.5-2.5 (3) MG/3ML IN SOLN
3.0000 mL | Freq: Four times a day (QID) | RESPIRATORY_TRACT | Status: AC
  Administered 2023-03-03 – 2023-03-04 (×3): 3 mL via RESPIRATORY_TRACT
  Filled 2023-03-03 (×2): qty 3

## 2023-03-03 MED ORDER — MAGNESIUM SULFATE 2 GM/50ML IV SOLN
2.0000 g | Freq: Once | INTRAVENOUS | Status: AC
  Administered 2023-03-03: 2 g via INTRAVENOUS
  Filled 2023-03-03: qty 50

## 2023-03-03 MED ORDER — METHYLPREDNISOLONE SODIUM SUCC 125 MG IJ SOLR
125.0000 mg | Freq: Once | INTRAMUSCULAR | Status: AC
  Administered 2023-03-03: 125 mg via INTRAVENOUS
  Filled 2023-03-03: qty 2

## 2023-03-03 MED ORDER — ACETAMINOPHEN 325 MG PO TABS
650.0000 mg | ORAL_TABLET | Freq: Four times a day (QID) | ORAL | Status: DC | PRN
  Administered 2023-03-05 – 2023-03-06 (×2): 650 mg via ORAL
  Filled 2023-03-03 (×2): qty 2

## 2023-03-03 MED ORDER — ORAL CARE MOUTH RINSE: 15.0000 mL | OROMUCOSAL | Status: DC | PRN

## 2023-03-03 MED ORDER — SODIUM CHLORIDE 0.9 % IV SOLN
500.0000 mg | INTRAVENOUS | Status: AC
  Administered 2023-03-03: 500 mg via INTRAVENOUS
  Filled 2023-03-03: qty 5

## 2023-03-03 MED ORDER — GUAIFENESIN-DM 100-10 MG/5ML PO SYRP
15.0000 mL | ORAL_SOLUTION | Freq: Three times a day (TID) | ORAL | Status: DC
  Administered 2023-03-03 – 2023-03-04 (×2): 15 mL via ORAL
  Filled 2023-03-03 (×2): qty 15

## 2023-03-03 MED ORDER — ALBUTEROL SULFATE (2.5 MG/3ML) 0.083% IN NEBU
INHALATION_SOLUTION | RESPIRATORY_TRACT | Status: AC
  Filled 2023-03-03: qty 12

## 2023-03-03 MED ORDER — ALBUTEROL SULFATE HFA 108 (90 BASE) MCG/ACT IN AERS: 2.0000 | INHALATION_SPRAY | RESPIRATORY_TRACT | Status: DC | PRN

## 2023-03-03 MED ORDER — IPRATROPIUM-ALBUTEROL 0.5-2.5 (3) MG/3ML IN SOLN
3.0000 mL | RESPIRATORY_TRACT | Status: DC | PRN
  Administered 2023-03-04 – 2023-03-05 (×2): 3 mL via RESPIRATORY_TRACT
  Filled 2023-03-03: qty 3

## 2023-03-03 MED ORDER — CHLORHEXIDINE GLUCONATE CLOTH 2 % EX PADS
6.0000 | MEDICATED_PAD | Freq: Every day | CUTANEOUS | Status: DC
  Administered 2023-03-03 – 2023-03-05 (×3): 6 via TOPICAL

## 2023-03-03 MED ORDER — ACETAMINOPHEN 650 MG RE SUPP: 650.0000 mg | Freq: Four times a day (QID) | RECTAL | Status: DC | PRN

## 2023-03-03 MED ORDER — SODIUM CHLORIDE 0.9 % IV SOLN: INTRAVENOUS | Status: DC

## 2023-03-03 MED ORDER — ONDANSETRON HCL 4 MG/2ML IJ SOLN: 4.0000 mg | Freq: Four times a day (QID) | INTRAMUSCULAR | Status: DC | PRN

## 2023-03-03 MED ORDER — METHYLPREDNISOLONE SODIUM SUCC 125 MG IJ SOLR
60.0000 mg | Freq: Two times a day (BID) | INTRAMUSCULAR | Status: AC
  Administered 2023-03-04 (×2): 60 mg via INTRAVENOUS
  Filled 2023-03-03 (×2): qty 2

## 2023-03-03 MED ORDER — METHYLPREDNISOLONE SODIUM SUCC 125 MG IJ SOLR
120.0000 mg | Freq: Once | INTRAMUSCULAR | Status: DC
  Filled 2023-03-03: qty 2

## 2023-03-03 MED ORDER — ONDANSETRON HCL 4 MG PO TABS: 4.0000 mg | ORAL_TABLET | Freq: Four times a day (QID) | ORAL | Status: DC | PRN

## 2023-03-03 MED ORDER — ALBUTEROL (5 MG/ML) CONTINUOUS INHALATION SOLN
10.0000 mg/h | INHALATION_SOLUTION | Freq: Once | RESPIRATORY_TRACT | Status: AC
  Administered 2023-03-03: 10 mg/h via RESPIRATORY_TRACT

## 2023-03-03 MED ORDER — PREDNISONE 20 MG PO TABS
40.0000 mg | ORAL_TABLET | Freq: Every day | ORAL | Status: DC
  Administered 2023-03-05 – 2023-03-06 (×2): 40 mg via ORAL
  Filled 2023-03-03 (×2): qty 2

## 2023-03-03 MED ORDER — PREDNISONE 20 MG PO TABS: 40.0000 mg | ORAL_TABLET | Freq: Every day | ORAL | Status: DC

## 2023-03-03 MED ORDER — AZITHROMYCIN 250 MG PO TABS
500.0000 mg | ORAL_TABLET | Freq: Every day | ORAL | Status: DC
  Administered 2023-03-04 – 2023-03-06 (×3): 500 mg via ORAL
  Filled 2023-03-03 (×3): qty 2

## 2023-03-03 NOTE — H&P (Signed)
History and Physical    ARIAL GALLIGAN WUJ:[id number] DOB: 02/05/1956 DOA: 03/03/2023  PCP: Abram Sander, MD   Patient coming from: Home  I have personally briefly reviewed patient's old medical records in [facility]  Chief Complaint: Difficulty breathing  HPI: MAYRIN SCHMUCK is a 67 y.o. female with medical history significant for COPD GOLD stage 4, chronic respiratory failure on 4 L, hypertension, high-grade serous ovarian cancer. Patient presented to the ED with complaints of difficulty breathing ongoing over the past 3 days.  She also reports worsening cough productive of greenish sputum.  She reports some chest tightness from difficulty breathing otherwise no chest pain.  No leg swelling.  She used her home bronchodilators without significant improvement.  She has never required intubation in the past.  ED Course: O2 sats 87% on 4 L, increased to 6 L, tachycardic, heart rate 115-119.  Systolic blood pressure 130s to 150s. Chest x-ray shows bilateral lung opacities-current or atelectasis.  VBG shows pH of 7.36, pCO2 of 67.  125 mg Solu-Medrol, continuous albuterol neb, magnesium 2 g given.  Review of Systems: As per HPI all other systems reviewed and negative.  Past Medical History:  Diagnosis Date   Anemia    Aortic atherosclerosis    Arthritis    Asthma    Bilateral carotid artery stenosis    Cancer    Gastric Cancer   COPD (chronic obstructive pulmonary disease)    High cholesterol    Hyperlipidemia    Hypertension    Neuropathy    Osteoarthritis    Ovarian cancer    Oxygen dependent    Peripheral vascular disease    Peritoneal carcinoma    Pneumonia 2019   Port-A-Cath in place 07/03/2020   Pulmonary emphysema     Past Surgical History:  Procedure Laterality Date   BIOPSY  06/30/2022   Procedure: BIOPSY;  Surgeon: Lanelle Bal, DO;  [facility];  Service: Endoscopy;;  gastric   BIOPSY  09/03/2022   Procedure: BIOPSY;  Surgeon:  Dolores Frame, MD;  [facility]: [facility];  Service: Gastroenterology;;   COLONOSCOPY WITH PROPOFOL N/A 09/03/2022   Procedure: COLONOSCOPY WITH PROPOFOL;  Surgeon: Dolores Frame, MD;  [facility]: [facility];  Service: Gastroenterology;  Laterality: N/A;  900 ASA 3   ESOPHAGEAL BRUSHING  09/03/2022   Procedure: ESOPHAGEAL BRUSHING;  Surgeon: Marguerita Merles, Reuel Boom, MD;  [facility]: [facility];  Service: Gastroenterology;;   ESOPHAGOGASTRODUODENOSCOPY (EGD) WITH PROPOFOL N/A 02/17/2021   Procedure: ESOPHAGOGASTRODUODENOSCOPY (EGD) WITH PROPOFOL;  Surgeon: Dolores Frame, MD;  [facility]: [facility];  Service: Gastroenterology;  Laterality: N/A;   ESOPHAGOGASTRODUODENOSCOPY (EGD) WITH PROPOFOL N/A 05/04/2022   Procedure: ESOPHAGOGASTRODUODENOSCOPY (EGD) WITH PROPOFOL;  Surgeon: Corbin Ade, MD;  [facility];  Service: Endoscopy;  Laterality: N/A;   ESOPHAGOGASTRODUODENOSCOPY (EGD) WITH PROPOFOL N/A 06/30/2022   Procedure: ESOPHAGOGASTRODUODENOSCOPY (EGD) WITH PROPOFOL;  Surgeon: Lanelle Bal, DO;  [facility];  Service: Endoscopy;  Laterality: N/A;   ESOPHAGOGASTRODUODENOSCOPY (EGD) WITH PROPOFOL N/A 09/03/2022   Procedure: ESOPHAGOGASTRODUODENOSCOPY (EGD) WITH PROPOFOL;  Surgeon: Dolores Frame, MD;  [facility]: [facility];  Service: Gastroenterology;  Laterality: N/A;   HALLUX VALGUS BASE WEDGE Right 06/09/2015   Procedure: Base wedge osteotomy with modified McBride right foot ;  Surgeon: Linus Galas, MD;  Location: [facility];  Service: Podiatry;  Laterality: Right;   PORTACATH PLACEMENT Left 06/28/2020   Procedure: PORT-A-CATHETER PLACEMENT LEFT  CHEST (attached catheter in left subclavian);  Surgeon: Lucretia Roers, MD;  Location: [facility];  Service: General;  Laterality: Left;   TUBAL LIGATION     VIDEO BRONCHOSCOPY WITH ENDOBRONCHIAL NAVIGATION N/A 03/09/2021   Procedure: VIDEO BRONCHOSCOPY WITH ENDOBRONCHIAL  NAVIGATION;  Surgeon: Vida Rigger, MD;  Location: [facility];  Service: Thoracic;  Laterality: N/A;   VIDEO BRONCHOSCOPY WITH ENDOBRONCHIAL ULTRASOUND N/A 03/09/2021   Procedure: VIDEO BRONCHOSCOPY WITH ENDOBRONCHIAL ULTRASOUND;  Surgeon: Vida Rigger, MD;  Location: [facility];  Service: Thoracic;  Laterality: N/A;     reports that she quit smoking about 9 years ago. Her smoking use included cigarettes. She has a 60.00 pack-year smoking history. She has never used smokeless tobacco. She reports that she does not drink alcohol and does not use drugs.  No Known Allergies  Family History  Problem Relation Age of Onset   Alzheimer's disease Mother    COPD Father    Emphysema Father    Hypertension Father    Healthy Sister    Healthy Brother    Alzheimer's disease Maternal Grandmother    Healthy Sister    Healthy Sister    Healthy Sister    Prostate cancer Other        paternal grandmother's brother; dx in early 6s   Breast cancer Neg Hx     Prior to Admission medications   Medication Sig Start Date End Date Taking? Authorizing Provider  albuterol (VENTOLIN HFA) 108 (90 Base) MCG/ACT inhaler Inhale 2 puffs into the lungs every 6 (six) hours as needed for wheezing or shortness of breath. 07/01/22   Shon Hale, MD  amLODipine (NORVASC) 5 MG tablet Take 5 mg by mouth at bedtime.     [provider]  aspirin EC 81 MG tablet Take 1 tablet (81 mg total) by mouth daily with breakfast. 07/01/22   Mariea Clonts, Courage, MD  atorvastatin (LIPITOR) 40 MG tablet Take 1 tablet (40 mg total) by mouth daily. 01/02/22   Noralee Stain, DO  Budeson-Glycopyrrol-Formoterol (BREZTRI AEROSPHERE) 160-9-4.8 MCG/ACT AERO Inhale 2 puffs into the lungs 2 (two) times daily. 09/10/22   Nyoka Cowden, MD  cefdinir (OMNICEF) 300 MG capsule Take 1 capsule (300 mg total) by mouth 2 (two) times daily. 02/12/23   Nyoka Cowden, MD  ferrous sulfate 325 (65 FE) MG tablet Take 325 mg by mouth every other day.     [provider]  gabapentin (NEURONTIN) 600 MG tablet Take 1 tablet by mouth 2 (two) times daily. 10/17/22   [provider]  guaiFENesin-dextromethorphan (ROBITUSSIN DM) 100-10 MG/5ML syrup Take 5 mLs by mouth every 4 (four) hours as needed for cough. 01/03/23   Sherryll Burger, Pratik D, DO  ipratropium (ATROVENT HFA) 17 MCG/ACT inhaler Inhale 2 puffs into the lungs every 6 (six) hours as needed for wheezing.    [provider]  levalbuterol Pauline Aus) 1.25 MG/3ML nebulizer solution Take 1.25 mg by nebulization in the morning, at noon, and at bedtime. 11/08/22   Johnson, Clanford L, MD  magnesium oxide (MAG-OX) 400 (240 Mg) MG tablet Take 1 tablet (400 mg total) by mouth 2 (two) times daily. 09/06/22   Doreatha Massed, MD  morphine (MSIR) 15 MG tablet Use 1/2-1 tab every 4 hours as needed for shortness of breath. 11/12/22   Sherryll Burger, Pratik D, DO  niraparib tosylate (ZEJULA) 100 MG tablet Take 1 tablet (100 mg total) by mouth daily. May take at bedtime to reduce nausea and vomiting. 12/02/22   Doreatha Massed,  MD  nortriptyline (PAMELOR) 25 MG capsule Take 25 mg by mouth at bedtime.    [provider]  OXYGEN Inhale 3 L into the lungs continuous.    [provider]  pantoprazole (PROTONIX) 40 MG tablet Take 1 tablet (40 mg total) by mouth 2 (two) times daily. Patient taking differently: Take 40 mg by mouth daily. 07/01/22 01/02/23  Shon Hale, MD  potassium chloride SA (KLOR-CON M) 20 MEQ tablet Take 1 tablet (20 mEq total) by mouth daily. 12/31/22   Doreatha Massed, MD  primidone (MYSOLINE) 50 MG tablet Take 50 mg by mouth at bedtime.    [provider]    Physical Exam: Vitals:   03/03/23 1451 03/03/23 1515 03/03/23 1530 03/03/23 1600  BP:   (!) 158/85 135/67  Pulse:   (!) 115 (!) 119  Resp:   (!) 25 (!) 25  Temp:      TempSrc:      SpO2:  92% 91% 98%  Weight: 50.8 kg     Height: 5' (1.524 m)       Constitutional: Acutely  ill-appearing, dyspneic Vitals:   03/03/23 1451 03/03/23 1515 03/03/23 1530 03/03/23 1600  BP:   (!) 158/85 135/67  Pulse:   (!) 115 (!) 119  Resp:   (!) 25 (!) 25  Temp:      TempSrc:      SpO2:  92% 91% 98%  Weight: 50.8 kg     Height: 5' (1.524 m)      Eyes: PERRL, lids and conjunctivae normal ENMT: Mucous membranes are moist.   Neck: normal, supple, no masses, no thyromegaly Respiratory: Appears moderately dyspneic, marked reduced air entry bilaterally with faint expiratory wheezing heard. Cardiovascular: Tachycardic, regular rate and rhythm, no murmurs / rubs / gallops. No extremity edema.  Abdomen: no tenderness, no masses palpated. No hepatosplenomegaly. Bowel sounds positive.  Musculoskeletal: no clubbing / cyanosis. No joint deformity upper and lower extremities.  Skin: no rashes, lesions, ulcers. No induration Neurologic: No facial asymmetry, speech fluent without aphasia, moving extremities spontaneously.  Psychiatric: Normal judgment and insight. Alert and oriented x 3. Normal mood.   Labs on Admission: I have personally reviewed following labs and imaging studies  CBC: Recent Labs  Lab 03/03/23 1601  WBC 10.1  HGB 10.4*  HCT 33.5*  MCV 90.3  PLT 219   Basic Metabolic Panel: Recent Labs  Lab 03/03/23 1601  NA 133*  K 3.9  CL 92*  CO2 32  GLUCOSE 123*  BUN 10  CREATININE 0.60  CALCIUM 8.6*   Radiological Exams on Admission: DG Chest Port 1 View  Result Date: 03/03/2023 CLINICAL DATA:  Shortness of breath. EXAM: PORTABLE CHEST 1 VIEW COMPARISON:  January 02, 2023. FINDINGS: The heart size and mediastinal contours are within normal limits. Left subclavian Port-A-Cath is unchanged in position. Stable bibasilar densities are noted most consistent with scarring or subsegmental atelectasis. Bilateral upper lobe scarring is noted as well. The visualized skeletal structures are unremarkable. IMPRESSION: Stable bilateral lung opacities are noted concerning for  scarring or subsegmental atelectasis. Electronically Signed   By: Lupita Raider M.D.   On: 03/03/2023 15:21    EKG: Independently reviewed.  Sinus tachycardia, rate 121, QTc 406.  No significant change from prior.  Assessment/Plan Principal Problem:   COPD exacerbation Active Problems:   Acute on chronic respiratory failure with hypoxia   Ovarian cancer   Essential hypertension   Peritoneal carcinomatosis   Essential tremor   Assessment and  Plan: * COPD exacerbation COPD exacerbation with acute hypoxic respiratory failure.  O2 sats down to 87% on home 4 L.  Currently on 6 L with sats 91 to 98%.  VBG shows pH of 7.36, pCO2 of 67.  Chest xray-bilateral lung opacities - suggesting scarring on prior atelectasis.  Quit smoking cigarettes a while back.  Follows with Dr. Sherene Sires. -IV azithromycin -DuoNebs as needed and scheduled -Mucolytic's -125 mg Solu-Medrol given, continue 60 twice daily -Respiratory therapy protocol -Flutter valve -CBG q12 hourly while on steroids - N/S 100cc/hr x 10hrs  Acute on chronic respiratory failure with hypoxia On chronic 4 L -7%, currently on , sats now 91 to 98%  Ovarian cancer High-grade serous ovarian cancer, with peritoneal carcinomatosis.  Follows with Dr. Ellin Saba. -Resume niraparib  Essential hypertension Systolic 130s to 130Q -resume Norvasc 5 mg    DVT prophylaxis: Lovenox Code Status: FULL code Family Communication: Daughter Elease Hashimoto at bedside Disposition Plan: > 2 days Consults called: None Admission status: Obs stepdown  Author: Onnie Boer, MD 03/03/2023 5:50 PM  For on call review [email or web address].

## 2023-03-03 NOTE — ED Provider Notes (Signed)
[place] EMERGENCY DEPARTMENT AT [facility] Provider Note   CSN: [id number] Arrival date & time: 03/03/23  1427     History {Add pertinent medical, surgical, social history, OB history to HPI:1} Chief Complaint  Patient presents with   Shortness of Breath    [name] is a 67 y.o. female.  She has a history of severe COPD is on 4 L nasal cannula oxygen, 5 mg prednisone follows with Dr. Sherene Sires pulmonology.  She is complained of increased shortness of breath over the last few days cough productive of some green sputum.  Increased shortness of breath with any type of exertion.  She says she had a low-grade fever of 99 no chest pain abdominal pain vomiting diarrhea.  She has been trying her presleep breathing inhaler and nebulizer without any improvement.  The history is provided by the patient.  Shortness of Breath Severity:  Severe Onset quality:  Gradual Duration:  3 days Timing:  Constant Progression:  Unchanged Chronicity:  Recurrent Relieved by:  Nothing Worsened by:  Activity Ineffective treatments:  Inhaler and oxygen Associated symptoms: cough, fever and sputum production   Associated symptoms: no abdominal pain, no chest pain, no hemoptysis and no vomiting        Home Medications Prior to Admission medications   Medication Sig Start Date End Date Taking? Authorizing Provider  albuterol (VENTOLIN HFA) 108 (90 Base) MCG/ACT inhaler Inhale 2 puffs into the lungs every 6 (six) hours as needed for wheezing or shortness of breath. 07/01/22   Shon Hale, MD  amLODipine (NORVASC) 5 MG tablet Take 5 mg by mouth at bedtime.     [provider]  aspirin EC 81 MG tablet Take 1 tablet (81 mg total) by mouth daily with breakfast. 07/01/22   Mariea Clonts, Courage, MD  atorvastatin (LIPITOR) 40 MG tablet Take 1 tablet (40 mg total) by mouth daily. 01/02/22   Noralee Stain, DO  Budeson-Glycopyrrol-Formoterol (BREZTRI AEROSPHERE) 160-9-4.8 MCG/ACT AERO Inhale 2  puffs into the lungs 2 (two) times daily. 09/10/22   Nyoka Cowden, MD  cefdinir (OMNICEF) 300 MG capsule Take 1 capsule (300 mg total) by mouth 2 (two) times daily. 02/12/23   Nyoka Cowden, MD  ferrous sulfate 325 (65 FE) MG tablet Take 325 mg by mouth every other day.    [provider]  gabapentin (NEURONTIN) 600 MG tablet Take 1 tablet by mouth 2 (two) times daily. 10/17/22   [provider]  guaiFENesin-dextromethorphan (ROBITUSSIN DM) 100-10 MG/5ML syrup Take 5 mLs by mouth every 4 (four) hours as needed for cough. 01/03/23   Sherryll Burger, Pratik D, DO  ipratropium (ATROVENT HFA) 17 MCG/ACT inhaler Inhale 2 puffs into the lungs every 6 (six) hours as needed for wheezing.    [provider]  levalbuterol Pauline Aus) 1.25 MG/3ML nebulizer solution Take 1.25 mg by nebulization in the morning, at noon, and at bedtime. 11/08/22   Johnson, Clanford L, MD  magnesium oxide (MAG-OX) 400 (240 Mg) MG tablet Take 1 tablet (400 mg total) by mouth 2 (two) times daily. 09/06/22   Doreatha Massed, MD  morphine (MSIR) 15 MG tablet Use 1/2-1 tab every 4 hours as needed for shortness of breath. 11/12/22   Sherryll Burger, Pratik D, DO  niraparib tosylate (ZEJULA) 100 MG tablet Take 1 tablet (100 mg total) by mouth daily. May take at bedtime to reduce nausea and vomiting. 12/02/22   Doreatha Massed, MD  nortriptyline (PAMELOR) 25 MG capsule Take 25 mg by mouth at  bedtime.    [provider]  OXYGEN Inhale 3 L into the lungs continuous.    [provider]  pantoprazole (PROTONIX) 40 MG tablet Take 1 tablet (40 mg total) by mouth 2 (two) times daily. Patient taking differently: Take 40 mg by mouth daily. 07/01/22 01/02/23  Shon Hale, MD  potassium chloride SA (KLOR-CON M) 20 MEQ tablet Take 1 tablet (20 mEq total) by mouth daily. 12/31/22   Doreatha Massed, MD  primidone (MYSOLINE) 50 MG tablet Take 50 mg by mouth at bedtime.    [provider]      Allergies     Patient has no known allergies.    Review of Systems   Review of Systems  Constitutional:  Positive for fever.  Respiratory:  Positive for cough, sputum production and shortness of breath. Negative for hemoptysis.   Cardiovascular:  Negative for chest pain.  Gastrointestinal:  Negative for abdominal pain and vomiting.    Physical Exam Updated Vital Signs BP (!) 149/71 (BP Location: Left Arm)   Pulse (!) 118   Temp 98 F (36.7 C) (Oral)   Resp 20   Ht 5' (1.524 m)   Wt 50.8 kg   SpO2 97%   BMI 21.87 kg/m  Physical Exam Vitals and nursing note reviewed.  Constitutional:      General: She is in acute distress.     Appearance: She is well-developed.  HENT:     Head: Normocephalic and atraumatic.  Eyes:     Conjunctiva/sclera: Conjunctivae normal.  Cardiovascular:     Rate and Rhythm: Regular rhythm. Tachycardia present.     Heart sounds: No murmur heard. Pulmonary:     Effort: Tachypnea, accessory muscle usage and respiratory distress present.     Breath sounds: Normal breath sounds.  Abdominal:     Palpations: Abdomen is soft.     Tenderness: There is no abdominal tenderness.  Musculoskeletal:        General: No swelling. Normal range of motion.     Cervical back: Neck supple.     Right lower leg: No tenderness. No edema.     Left lower leg: No tenderness. No edema.  Skin:    General: Skin is warm and dry.     Capillary Refill: Capillary refill takes less than 2 seconds.  Neurological:     General: No focal deficit present.     Mental Status: She is alert.     ED Results / Procedures / Treatments   Labs (all labs ordered are listed, but only abnormal results are displayed) Labs Reviewed  BASIC METABOLIC PANEL  CBC  BLOOD GAS, VENOUS    EKG None  Radiology No results found.  Procedures Procedures  {Document cardiac monitor, telemetry assessment procedure when appropriate:1}  Medications Ordered in ED Medications  albuterol (PROVENTIL,VENTOLIN)  solution continuous neb (has no administration in time range)  magnesium sulfate IVPB 2 g 50 mL (has no administration in time range)  methylPREDNISolone sodium succinate (SOLU-MEDROL) 125 mg/2 mL injection 125 mg (has no administration in time range)    ED Course/ Medical Decision Making/ A&P   {   Click here for ABCD2, HEART and other calculatorsREFRESH Note before signing :1}                          Medical Decision Making Amount and/or Complexity of Data Reviewed Labs: ordered. Radiology: ordered.  Risk Prescription drug management.   This patient complains of ***;  this involves an extensive number of treatment Options and is a complaint that carries with it a high risk of complications and morbidity. The differential includes ***  I ordered, reviewed and interpreted labs, which included *** I ordered medication *** and reviewed PMP when indicated. I ordered imaging studies which included *** and I independently    visualized and interpreted imaging which showed *** Additional history obtained from *** Previous records obtained and reviewed *** I consulted *** and discussed lab and imaging findings and discussed disposition.  Cardiac monitoring reviewed, *** Social determinants considered, *** Critical Interventions: ***  After the interventions stated above, I reevaluated the patient and found *** Admission and further testing considered, ***   {Document critical care time when appropriate:1} {Document review of labs and clinical decision tools ie heart score, Chads2Vasc2 etc:1}  {Document your independent review of radiology images, and any outside records:1} {Document your discussion with family members, caretakers, and with consultants:1} {Document social determinants of health affecting pt's care:1} {Document your decision making why or why not admission, treatments were needed:1} Final Clinical Impression(s) / ED Diagnoses Final diagnoses:  None    Rx / DC  Orders ED Discharge Orders     None

## 2023-03-03 NOTE — ED Notes (Signed)
Pt O2 sat 87% on RA, increased to 6L, call placed to respiratory

## 2023-03-03 NOTE — Assessment & Plan Note (Signed)
Systolic 130s to 272Z -resume Norvasc 5 mg

## 2023-03-03 NOTE — Assessment & Plan Note (Addendum)
COPD exacerbation with acute hypoxic respiratory failure.  O2 sats down to 87% on home 4 L.  Currently on 6 L with sats 91 to 98%.  VBG shows pH of 7.36, pCO2 of 67.  Chest xray-bilateral lung opacities - suggesting scarring on prior atelectasis.  Quit smoking cigarettes a while back.  Follows with Dr. Sherene Sires. -IV azithromycin -DuoNebs as needed and scheduled -Mucolytic's -125 mg Solu-Medrol given, continue 60 twice daily -Respiratory therapy protocol -Flutter valve -CBG q12 hourly while on steroids - N/S 100cc/hr x 10hrs

## 2023-03-03 NOTE — Telephone Encounter (Signed)
Order placed for O2 to be provided by [employer]

## 2023-03-03 NOTE — Assessment & Plan Note (Addendum)
-  High-grade serous ovarian cancer, with peritoneal carcinomatosis.  Follows with Dr. Ellin Saba. -Resume niraparib -Continue outpatient follow-up with oncology service.

## 2023-03-03 NOTE — Assessment & Plan Note (Addendum)
-  On chronic 4 L nasal cannula supplementation -Requiring up to 6 L at time of presentation -Continue to wean oxygen supplementation as tolerated -Continue IVF status and bronchodilators as part of management for acute COPD exacerbation.

## 2023-03-04 ENCOUNTER — Other Ambulatory Visit: Payer: Self-pay

## 2023-03-04 DIAGNOSIS — J441 Chronic obstructive pulmonary disease with (acute) exacerbation: Secondary | ICD-10-CM | POA: Diagnosis not present

## 2023-03-04 DIAGNOSIS — J9621 Acute and chronic respiratory failure with hypoxia: Secondary | ICD-10-CM | POA: Diagnosis not present

## 2023-03-04 DIAGNOSIS — C569 Malignant neoplasm of unspecified ovary: Secondary | ICD-10-CM

## 2023-03-04 DIAGNOSIS — Z7951 Long term (current) use of inhaled steroids: Secondary | ICD-10-CM | POA: Diagnosis not present

## 2023-03-04 DIAGNOSIS — Z7952 Long term (current) use of systemic steroids: Secondary | ICD-10-CM | POA: Diagnosis not present

## 2023-03-04 DIAGNOSIS — J449 Chronic obstructive pulmonary disease, unspecified: Secondary | ICD-10-CM | POA: Diagnosis present

## 2023-03-04 DIAGNOSIS — Z87891 Personal history of nicotine dependence: Secondary | ICD-10-CM | POA: Diagnosis not present

## 2023-03-04 DIAGNOSIS — I739 Peripheral vascular disease, unspecified: Secondary | ICD-10-CM | POA: Diagnosis present

## 2023-03-04 DIAGNOSIS — J479 Bronchiectasis, uncomplicated: Secondary | ICD-10-CM | POA: Diagnosis present

## 2023-03-04 DIAGNOSIS — Z9981 Dependence on supplemental oxygen: Secondary | ICD-10-CM | POA: Diagnosis not present

## 2023-03-04 DIAGNOSIS — E7800 Pure hypercholesterolemia, unspecified: Secondary | ICD-10-CM | POA: Diagnosis present

## 2023-03-04 DIAGNOSIS — E871 Hypo-osmolality and hyponatremia: Secondary | ICD-10-CM | POA: Diagnosis present

## 2023-03-04 DIAGNOSIS — G629 Polyneuropathy, unspecified: Secondary | ICD-10-CM | POA: Diagnosis present

## 2023-03-04 DIAGNOSIS — I5031 Acute diastolic (congestive) heart failure: Secondary | ICD-10-CM | POA: Diagnosis not present

## 2023-03-04 DIAGNOSIS — Z7982 Long term (current) use of aspirin: Secondary | ICD-10-CM | POA: Diagnosis not present

## 2023-03-04 DIAGNOSIS — Z825 Family history of asthma and other chronic lower respiratory diseases: Secondary | ICD-10-CM | POA: Diagnosis not present

## 2023-03-04 DIAGNOSIS — Z820 Family history of epilepsy and other diseases of the nervous system: Secondary | ICD-10-CM | POA: Diagnosis not present

## 2023-03-04 DIAGNOSIS — I10 Essential (primary) hypertension: Secondary | ICD-10-CM | POA: Diagnosis not present

## 2023-03-04 DIAGNOSIS — Z79899 Other long term (current) drug therapy: Secondary | ICD-10-CM | POA: Diagnosis not present

## 2023-03-04 DIAGNOSIS — G250 Essential tremor: Secondary | ICD-10-CM | POA: Diagnosis present

## 2023-03-04 DIAGNOSIS — K219 Gastro-esophageal reflux disease without esophagitis: Secondary | ICD-10-CM

## 2023-03-04 DIAGNOSIS — Z8249 Family history of ischemic heart disease and other diseases of the circulatory system: Secondary | ICD-10-CM | POA: Diagnosis not present

## 2023-03-04 DIAGNOSIS — C786 Secondary malignant neoplasm of retroperitoneum and peritoneum: Secondary | ICD-10-CM | POA: Diagnosis present

## 2023-03-04 DIAGNOSIS — J439 Emphysema, unspecified: Secondary | ICD-10-CM | POA: Diagnosis present

## 2023-03-04 LAB — BASIC METABOLIC PANEL
Anion gap: 5 (ref 5–15)
BUN: 12 mg/dL (ref 8–23)
CO2: 32 mmol/L (ref 22–32)
Calcium: 7.6 mg/dL — ABNORMAL LOW (ref 8.9–10.3)
Chloride: 96 mmol/L — ABNORMAL LOW (ref 98–111)
Creatinine, Ser: 0.54 mg/dL (ref 0.44–1.00)
GFR, Estimated: >60 mL/min (ref >60)
Glucose, Bld: 117 mg/dL — ABNORMAL HIGH (ref 70–99)
Potassium: 4.1 mmol/L (ref 3.5–5.1)
Sodium: 133 mmol/L — ABNORMAL LOW (ref 135–145)

## 2023-03-04 LAB — GLUCOSE, CAPILLARY
Glucose-Capillary: 101 mg/dL — ABNORMAL HIGH (ref 70–99)
Glucose-Capillary: 109 mg/dL — ABNORMAL HIGH (ref 70–99)

## 2023-03-04 LAB — CBC
HCT: 27.5 % — ABNORMAL LOW (ref 36.0–46.0)
Hemoglobin: 8.7 g/dL — ABNORMAL LOW (ref 12.0–15.0)
MCH: 28.3 pg (ref 26.0–34.0)
MCHC: 31.6 g/dL (ref 30.0–36.0)
MCV: 89.6 fL (ref 80.0–100.0)
Platelets: 175 10*3/uL (ref 150–400)
RBC: 3.07 MIL/uL — ABNORMAL LOW (ref 3.87–5.11)
RDW: 17.2 % — ABNORMAL HIGH (ref 11.5–15.5)
WBC: 5.5 10*3/uL (ref 4.0–10.5)
nRBC: 0 % (ref 0.0–0.2)

## 2023-03-04 MED ORDER — PANTOPRAZOLE SODIUM 40 MG PO TBEC
40.0000 mg | DELAYED_RELEASE_TABLET | Freq: Every day | ORAL | Status: DC
  Administered 2023-03-04 – 2023-03-06 (×3): 40 mg via ORAL
  Filled 2023-03-04 (×3): qty 1

## 2023-03-04 MED ORDER — BUDESONIDE 0.5 MG/2ML IN SUSP
0.5000 mg | Freq: Two times a day (BID) | RESPIRATORY_TRACT | Status: DC
  Administered 2023-03-04 – 2023-03-06 (×5): 0.5 mg via RESPIRATORY_TRACT
  Filled 2023-03-04 (×5): qty 2

## 2023-03-04 MED ORDER — DM-GUAIFENESIN ER 30-600 MG PO TB12
1.0000 | ORAL_TABLET | Freq: Two times a day (BID) | ORAL | Status: DC
  Administered 2023-03-04 – 2023-03-06 (×5): 1 via ORAL
  Filled 2023-03-04 (×5): qty 1

## 2023-03-04 MED ORDER — ARFORMOTEROL TARTRATE 15 MCG/2ML IN NEBU
15.0000 ug | INHALATION_SOLUTION | Freq: Two times a day (BID) | RESPIRATORY_TRACT | Status: DC
  Administered 2023-03-04 – 2023-03-06 (×5): 15 ug via RESPIRATORY_TRACT
  Filled 2023-03-04 (×5): qty 2

## 2023-03-04 MED ORDER — ENOXAPARIN SODIUM 40 MG/0.4ML IJ SOSY
40.0000 mg | PREFILLED_SYRINGE | INTRAMUSCULAR | Status: DC
  Administered 2023-03-04 – 2023-03-05 (×2): 40 mg via SUBCUTANEOUS
  Filled 2023-03-04 (×2): qty 0.4

## 2023-03-04 NOTE — Progress Notes (Signed)
Progress Note   Patient: [name] ZOX:[id number] DOB: 1956-01-10 DOA: 03/03/2023     0 DOS: the patient was seen and examined on 03/04/2023   Brief hospital course: As per H&P written by Dr. Mariea Clonts on 03/03/2023 [name] is a 67 y.o. female with medical history significant for COPD GOLD stage 4, chronic respiratory failure on 4 L, hypertension, high-grade serous ovarian cancer. Patient presented to the ED with complaints of difficulty breathing ongoing over the past 3 days.  She also reports worsening cough productive of greenish sputum.  She reports some chest tightness from difficulty breathing otherwise no chest pain.  No leg swelling.  She used her home bronchodilators without significant improvement.  She has never required intubation in the past.   ED Course: O2 sats 87% on 4 L, increased to 6 L, tachycardic, heart rate 115-119.  Systolic blood pressure 130s to 150s. Chest x-ray shows bilateral lung opacities-current or atelectasis.  VBG shows pH of 7.36, pCO2 of 67.  125 mg Solu-Medrol, continuous albuterol neb, magnesium 2 g given.    Assessment and Plan: * COPD exacerbation -COPD exacerbation with acute hypoxic respiratory failure and bronchiectasis -87% on chronic 4 L supplementation at time of admission; in the requiring up to 6 L. -Continue IV steroids, mucolytic's, bronchodilator management/nebulizers flutter valve and antibiotics - wean off oxygen supplementation as tolerated and follow clinical response.   Acute on chronic respiratory failure with hypoxia -On chronic 4 L nasal cannula supplementation -Requiring up to 6 L at time of presentation -Continue to wean oxygen supplementation as tolerated -Continue IVF status and bronchodilators as part of management for acute COPD exacerbation.    Ovarian cancer -High-grade serous ovarian cancer, with peritoneal carcinomatosis.  Follows with Dr. Ellin Saba. -Resume niraparib -Continue outpatient follow-up with  oncology service.  Essential hypertension -Stable labetalol -Continue current antihypertensive agents  GERD/GI prophylaxis -Continue using PPI.   Subjective:  Afebrile, no chest pain, no nausea or vomiting; reports feeling slightly better.  Still requiring around 5 L nasal cannula supplementation to maintain saturation and is having difficulty speaking in full sentences and experiencing intermittent productive coughing spells.  Physical Exam: Vitals:   03/04/23 0400 03/04/23 0500 03/04/23 0600 03/04/23 0723  BP: (!) 109/48 (!) 112/52 125/73   Pulse: 74 71 78 79  Resp: Temp: 98.1 F (36.7 C)   97.9 F (36.6 C)  TempSrc: Oral   Oral  SpO2: 99% 98% 97% 98%  Weight:  52.5 kg    Height:       General exam: Alert, awake, oriented x 3; no fever. Respiratory system: Positive expiratory wheezing, fair air movement, no using accessory muscle.  Intermittently tachypneic with activity. Cardiovascular system:RRR. No rubs or gallops. Gastrointestinal system: Abdomen is nondistended, soft and nontender. No organomegaly or masses felt. Normal bowel sounds heard. Central nervous system: Alert and oriented. No focal neurological deficits. Extremities: No cyanosis, clubbing or edema. Skin: No petechiae. Psychiatry: Judgement and insight appear normal. Mood & affect appropriate.   Data Reviewed: Basic metabolic panel: Sodium 133, potassium 4.1, chloride 96, bicarb 32, BUN 12, creatinine 0.54 and GFR more than 60 CBC: White blood cell 5.5, hemoglobin 8.7 and platelet count 175 K.  Family Communication: No family at bedside.  Disposition: Status is: Observation The patient will require care spanning > 2 midnights and should be moved to inpatient because: Continue treatment with IV steroids, bronchodilator management and wean off oxygen supplementation as tolerated.   Planned Discharge  Destination: Home  Time spent: 50 minutes  Author: Vassie Loll, MD 03/04/2023 7:52  AM  For on call review [email or web address].

## 2023-03-04 NOTE — Plan of Care (Signed)
  Problem: Education: Goal: Knowledge of General Education information will improve Description: Including pain rating scale, medication(s)/side effects and non-pharmacologic comfort measures Outcome: Not Progressing   Problem: Health Behavior/Discharge Planning: Goal: Ability to manage health-related needs will improve Outcome: Not Progressing   Problem: Clinical Measurements: Goal: Ability to maintain clinical measurements within normal limits will improve Outcome: Not Progressing Goal: Will remain free from infection Outcome: Not Progressing Goal: Diagnostic test results will improve Outcome: Not Progressing Goal: Respiratory complications will improve Outcome: Not Progressing Goal: Cardiovascular complication will be avoided Outcome: Not Progressing   Problem: Activity: Goal: Risk for activity intolerance will decrease Outcome: Not Progressing   Problem: Nutrition: Goal: Adequate nutrition will be maintained Outcome: Not Progressing   Problem: Coping: Goal: Level of anxiety will decrease Outcome: Not Progressing   Problem: Elimination: Goal: Will not experience complications related to bowel motility Outcome: Not Progressing Goal: Will not experience complications related to urinary retention Outcome: Not Progressing   Problem: Pain Managment: Goal: General experience of comfort will improve Outcome: Not Progressing   Problem: Safety: Goal: Ability to remain free from injury will improve Outcome: Not Progressing   Problem: Skin Integrity: Goal: Risk for impaired skin integrity will decrease Outcome: Not Progressing   Problem: Education: Goal: Knowledge of disease or condition will improve Outcome: Not Progressing Goal: Knowledge of the prescribed therapeutic regimen will improve Outcome: Not Progressing Goal: Individualized Educational Video(s) Outcome: Not Progressing   Problem: Activity: Goal: Ability to tolerate increased activity will  improve Outcome: Not Progressing Goal: Will verbalize the importance of balancing activity with adequate rest periods Outcome: Not Progressing   Problem: Respiratory: Goal: Ability to maintain a clear airway will improve Outcome: Not Progressing Goal: Levels of oxygenation will improve Outcome: Not Progressing Goal: Ability to maintain adequate ventilation will improve Outcome: Not Progressing   

## 2023-03-04 NOTE — TOC Progression Note (Signed)
  Transition of Care [facility]) Screening Note   Patient Details  Name: [name] Date of Birth: 1956/04/17   Transition of Care [facility]) CM/SW Contact:    Leitha Bleak, RN Phone Number: 03/04/2023, 1:04 PM    Transition of Care Department [facility]) has reviewed patient and no TOC needs have been identified at this time. We will continue to monitor patient advancement through interdisciplinary progression rounds. If new patient transition needs arise, please place a TOC consult.      Barriers to Discharge: Continued Medical Work up  Expected Discharge Plan and Services    Social Determinants of Health (SDOH) Interventions SDOH Screenings   Food Insecurity: No Food Insecurity (03/03/2023)  Housing: Low Risk  (03/03/2023)  Transportation Needs: No Transportation Needs (03/03/2023)  Utilities: Not At Risk (03/03/2023)  Alcohol Screen: Low Risk  (09/27/2020)  Depression (PHQ2-9): Low Risk  (09/27/2020)  Financial Resource Strain: Low Risk  (09/27/2020)  Physical Activity: Inactive (09/27/2020)  Social Connections: Socially Isolated (09/27/2020)  Stress: No Stress Concern Present (09/27/2020)  Tobacco Use: Medium Risk (03/03/2023)    Readmission Risk Interventions    01/03/2023   12:44 PM 10/30/2022   10:52 AM 10/07/2022   11:16 AM  Readmission Risk Prevention Plan  Transportation Screening Complete Complete Complete  Medication Review Oceanographer) Complete Complete Complete  HRI or Home Care Consult Complete Complete Complete  SW Recovery Care/Counseling Consult Complete Complete Complete  Palliative Care Screening Not Applicable Not Applicable Not Applicable  Skilled Nursing Facility Not Applicable Not Applicable Not Applicable

## 2023-03-05 DIAGNOSIS — J441 Chronic obstructive pulmonary disease with (acute) exacerbation: Secondary | ICD-10-CM | POA: Diagnosis not present

## 2023-03-05 LAB — GLUCOSE, CAPILLARY
Glucose-Capillary: 118 mg/dL — ABNORMAL HIGH (ref 70–99)
Glucose-Capillary: 118 mg/dL — ABNORMAL HIGH (ref 70–99)
Glucose-Capillary: 130 mg/dL — ABNORMAL HIGH (ref 70–99)

## 2023-03-05 MED ORDER — ALPRAZOLAM 0.25 MG PO TABS
0.2500 mg | ORAL_TABLET | Freq: Three times a day (TID) | ORAL | Status: DC | PRN
  Administered 2023-03-05 – 2023-03-06 (×2): 0.25 mg via ORAL
  Filled 2023-03-05 (×2): qty 1

## 2023-03-05 NOTE — TOC Initial Note (Signed)
Transition of Care [facility]) - Initial/Assessment Note    Patient Details  Name: [name] MRN: [id number] Date of Birth: 1956-04-10  Transition of Care [facility]) CM/SW Contact:    Leitha Bleak, RN Phone Number: 03/05/2023, 3:45 PM  Clinical Narrative:        Pt admitted for COPD exacerbation. Patient has a high risk readmission score. Well known to TOC. She lives with her boyfriend and daughter. Pt is fairly independent with ADLs at baseline. She ambulates with a walker. Pt is on 4L home O2 ([employer]). D/C today.  Last admission [facility] palliative set up last admission. Daughter Elease Hashimoto confirmed that have come to the house to visit. No other needs at this time.                      Expected Discharge Plan: Home/Self Care Barriers to Discharge: Continued Medical Work up   Patient Goals and CMS Choice Patient states their goals for this hospitalization and ongoing recovery are:: to go home. CMS Medicare.gov Compare Post Acute Care list provided to:: Patient Represenative (must comment) Choice offered to / list presented to : Adult Children    Expected Discharge Plan and Services      Living arrangements for the past 2 months: Single Family Home     Prior Living Arrangements/Services Living arrangements for the past 2 months: Single Family Home Lives with:: Significant Other, Adult Children    Current home services: DME    Activities of Daily Living Home Assistive Devices/Equipment: Dentures (specify type), Eyeglasses, Oxygen, Walker (specify type), Nebulizer, Shower chair without back ADL Screening (condition at time of admission) Patient's cognitive ability adequate to safely complete daily activities?: Yes Is the patient deaf or have difficulty hearing?: No Does the patient have difficulty seeing, even when wearing glasses/contacts?: No Does the patient have difficulty concentrating, remembering, or making decisions?: No Patient able to express need for assistance with  ADLs?: Yes Does the patient have difficulty dressing or bathing?: No Independently performs ADLs?: Yes (appropriate for developmental age) Does the patient have difficulty walking or climbing stairs?: Yes Weakness of Legs: None Weakness of Arms/Hands: Both  Permission Sought/Granted   Emotional Assessment    Alcohol / Substance Use: Not Applicable Psych Involvement: No (comment)  Admission diagnosis:  COPD with acute exacerbation [J44.1] COPD (chronic obstructive pulmonary disease) [J44.9] Patient Active Problem List   Diagnosis Date Noted   COPD (chronic obstructive pulmonary disease) 03/04/2023   Sepsis due to pneumonia 10/29/2022   Dyslipidemia 10/29/2022   Essential tremor 10/29/2022   GERD without esophagitis 10/29/2022   Peripheral neuropathy 10/29/2022   RSV (respiratory syncytial virus pneumonia) 10/29/2022   Symptomatic anemia 10/03/2022   Macrocytic anemia 10/03/2022   Pancytopenia 10/03/2022   Thrush 09/11/2022   Odynophagia 08/26/2022   COPD exacerbation 07/31/2022   GERD (gastroesophageal reflux disease) 07/15/2022   ABLA (acute blood loss anemia)    Esophagitis 06/28/2022   Hematochezia 05/03/2022   E coli bacteremia 03/11/2022   Hypokalemia 03/11/2022   Hyponatremia 03/10/2022   Thrombocytopenia 03/10/2022   Acute on chronic respiratory failure with hypoxia and hypercapnia 12/30/2021   Deficiency anemia 05/23/2021   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   Sepsis due to undetermined organism 12/18/2020   Lobar pneumonia 12/18/2020   Syncope 12/17/2020   Genetic testing 08/01/2020   Ovarian cancer 07/07/2020   Port-A-Cath in place 07/03/2020   Family history of prostate cancer 07/03/2020   Goals of care, counseling/discussion 07/01/2020   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/06/2020   Pneumonia 09/13/2018   Acute on chronic respiratory failure 01/22/2018   CAP (community acquired pneumonia) 01/18/2016   Iron deficiency anemia 11/07/2015    Chronic respiratory failure with hypoxia 11/07/2015   Acute on chronic respiratory failure with hypoxia 11/07/2015   Mixed hyperlipidemia 11/07/2015   COPD GOLD 4/ bronchiectasis/02 dep 11/06/2015   Essential hypertension 11/06/2015   PCP:  Abram Sander, MD Pharmacy:   [facility] [address] [place] - [address] [place] [address] Phone: [phone] Fax: [phone]  [facility] [address] [place] [address] Phone: [phone] Fax: [phone]     Social Determinants of Health (SDOH) Social History: SDOH Screenings   Food Insecurity: No Food Insecurity (03/03/2023)  Housing: Low Risk  (03/03/2023)  Transportation Needs: No Transportation Needs (03/03/2023)  Utilities: Not At Risk (03/03/2023)  Alcohol Screen: Low Risk  (09/27/2020)  Depression (PHQ2-9): Low Risk  (09/27/2020)  Financial Resource Strain: Low Risk  (09/27/2020)  Physical Activity: Inactive (09/27/2020)  Social Connections: Socially Isolated (09/27/2020)  Stress: No Stress Concern Present (09/27/2020)  Tobacco Use: Medium Risk (03/03/2023)   SDOH Interventions: Housing Interventions: Intervention Not Indicated   Readmission Risk Interventions    03/05/2023    3:44 PM 01/03/2023   12:44 PM 10/30/2022   10:52 AM  Readmission Risk Prevention Plan  Transportation Screening Complete Complete Complete  Medication Review Oceanographer) Complete Complete Complete  PCP or Specialist appointment within 3-5 days of discharge Not Complete    HRI or Home Care Consult Complete Complete Complete  SW Recovery Care/Counseling Consult Complete Complete Complete  Palliative Care Screening Complete Not Applicable Not Applicable  Skilled Nursing Facility Not Applicable Not Applicable Not Applicable

## 2023-03-05 NOTE — Progress Notes (Signed)
   03/05/23 1434  Vitals  Temp 99.5 F (37.5 C)  Temp Source Oral  BP (!) 190/100  MAP (mmHg) 127  BP Method Automatic  Pulse Rate (!) 111  Level of Consciousness  Level of Consciousness Alert  MEWS COLOR  MEWS Score Color Yellow  Oxygen Therapy  SpO2 91 %  O2 Device Nasal Cannula  O2 Flow Rate (L/min) 3 L/min  Pain Assessment  Pain Scale 0-10  Pain Score 0  MEWS Score  MEWS Temp 0  MEWS Systolic 0  MEWS Pulse 2  MEWS RR 0  MEWS LOC 0  MEWS Score 2   Called to pt's room for c/o SOB. Arrived to room to find pt sitting up in bed, pt states. "I just can't get my breath. Its been going on for about 15 minutes, I don't know what's wrong". Insp and exp wheezes noted in upper lung fields, L>R. Diminished bases. HR elevated with RRR. Pt with congested but non-productive cough. RT called for prn nebulizer treatment. Pt c/o being hot, air changed to 65 degrees in room and personal fan ordered from [employer] dept.  RT in and neb admin'd. During neb tx, pt began to complain of center chest pain, pressure and burning, restless and anxious, states, "Something is bad wrong with me, Bluford Kaufmann Lord help me!". Tele reads ST, EKG completed and reads ST with occas PVC.  By the time the EKG was completed, pt states chest pressure and pain was gone. Pt continues to feel SOB but RR down to 21, SaO2 93% (O2 was increased from 3lpm [place] to 4 lpm [place] by RT prior to neb tx). Pt states, "I think I just got too upset after talking to my daughter and boyfriend. We got bill troubles and I just let myself get too worked up!" As pt continues to talk about family and hobbies, her body relaxed and she is much less anxious. Lung sounds much more congested than prior to neb tx but RR continues to trend down. MD Sherryll Burger notified of above events and current condition/assessment.

## 2023-03-05 NOTE — Progress Notes (Signed)
PROGRESS NOTE    AHONESTY WOODFIN  ZOX:[id number] DOB: 1956-06-16 DOA: 03/03/2023 PCP: Abram Sander, MD   Brief Narrative:    [name] is a 67 y.o. female with medical history significant for COPD GOLD stage 4, chronic respiratory failure on 4 L, hypertension, high-grade serous ovarian cancer. Patient presented to the ED with complaints of difficulty breathing ongoing over the past 3 days.  She was admitted with acute on chronic hypoxemic respiratory failure secondary to acute COPD exacerbation and was started on IV steroids as well as breathing treatments and antibiotics.  Assessment & Plan:   Principal Problem:   COPD exacerbation Active Problems:   Acute on chronic respiratory failure with hypoxia   Ovarian cancer   Essential hypertension   Peritoneal carcinomatosis   Essential tremor   COPD (chronic obstructive pulmonary disease)  Acute COPD exacerbation -COPD exacerbation with acute hypoxic respiratory failure and bronchiectasis -87% on chronic 4 L supplementation at time of admission; in the requiring up to 6 L. -Continue IV steroids, mucolytic's, bronchodilator management/nebulizers flutter valve and antibiotics - wean off oxygen supplementation as tolerated and follow clinical response.     Acute on chronic respiratory failure with hypoxia -On chronic 4 L nasal cannula supplementation -Requiring up to 6 L at time of presentation -Continue to wean oxygen supplementation as tolerated -Continue IVF status and bronchodilators as part of management for acute COPD exacerbation.     Ovarian cancer -High-grade serous ovarian cancer, with peritoneal carcinomatosis.  Follows with Dr. Ellin Saba. -Resume niraparib -Continue outpatient follow-up with oncology service.   Essential hypertension -Stable labetalol -Continue current antihypertensive agents   GERD/GI prophylaxis -Continue using PPI.  DVT prophylaxis:Lovenox Code Status: Full Family Communication: None at  bedside Disposition Plan:  Status is: Inpatient Remains inpatient appropriate because: Need for IV medications.  Consultants:  None  Procedures:  None  Antimicrobials:  Anti-infectives (From admission, onward)    Start     Dose/Rate Route Frequency Ordered Stop   03/04/23 1000  azithromycin (ZITHROMAX) tablet 500 mg       See Hyperspace for full Linked Orders Report.   500 mg Oral Daily 03/03/23 1753 03/08/23 0959   03/03/23 1830  azithromycin (ZITHROMAX) 500 mg in sodium chloride 0.9 % 250 mL IVPB       See Hyperspace for full Linked Orders Report.   500 mg 250 mL/hr over 60 Minutes Intravenous Every 24 hours 03/03/23 1753 03/03/23 1908       Subjective: Patient seen and evaluated today with ongoing shortness of breath as well as cough and wheezing.  Does not feel ready for discharge.  Objective: Vitals:   03/05/23 0100 03/05/23 0240 03/05/23 0300 03/05/23 0803  BP: (!) 193/104 (!) 150/81 (!) 147/81   Pulse: (!) 109 95 94 90  Resp: 18  18 18   Temp:   97.6 F (36.4 C)   TempSrc:   Oral   SpO2: 93%  93% 91%  Weight:      Height:        Intake/Output Summary (Last 24 hours) at 03/05/2023 1105 Last data filed at 03/04/2023 1230 Gross per 24 hour  Intake 240 ml  Output --  Net 240 ml   Filed Weights   03/03/23 1451 03/03/23 1754 03/04/23 0500  Weight: 50.8 kg 51.5 kg 52.5 kg    Examination:  General exam: Appears calm and comfortable  Respiratory system: Clear to auscultation. Respiratory effort normal.  3 L nasal cannula Cardiovascular system: S1 & S2  heard, RRR.  Gastrointestinal system: Abdomen is soft Central nervous system: Alert and awake Extremities: No edema Skin: No significant lesions noted Psychiatry: Flat affect.    Data Reviewed: I have personally reviewed following labs and imaging studies  CBC: Recent Labs  Lab 03/03/23 1601 03/04/23 0420  WBC 10.1 5.5  HGB 10.4* 8.7*  HCT 33.5* 27.5*  MCV 90.3 89.6  PLT 219 175   Basic  Metabolic Panel: Recent Labs  Lab 03/03/23 1601 03/04/23 0420  NA 133* 133*  K 3.9 4.1  CL 92* 96*  CO2 32 32  GLUCOSE 123* 117*  BUN 10 12  CREATININE 0.60 0.54  CALCIUM 8.6* 7.6*   GFR: Estimated Creatinine Clearance: 51 mL/min (by C-G formula based on SCr of 0.54 mg/dL). Liver Function Tests: No results for input(s): "AST", "ALT", "ALKPHOS", "BILITOT", "PROT", "ALBUMIN" in the last 168 hours. No results for input(s): "LIPASE", "AMYLASE" in the last 168 hours. No results for input(s): "AMMONIA" in the last 168 hours. Coagulation Profile: No results for input(s): "INR", "PROTIME" in the last 168 hours. Cardiac Enzymes: No results for input(s): "CKTOTAL", "CKMB", "CKMBINDEX", "TROPONINI" in the last 168 hours. BNP (last 3 results) No results for input(s): "PROBNP" in the last 8760 hours. HbA1C: No results for input(s): "HGBA1C" in the last 72 hours. CBG: Recent Labs  Lab 03/03/23 2126 03/04/23 0721 03/04/23 1139 03/05/23 0333  GLUCAP 148* 101* 109* 130*   Lipid Profile: No results for input(s): "CHOL", "HDL", "LDLCALC", "TRIG", "CHOLHDL", "LDLDIRECT" in the last 72 hours. Thyroid Function Tests: No results for input(s): "TSH", "T4TOTAL", "FREET4", "T3FREE", "THYROIDAB" in the last 72 hours. Anemia Panel: No results for input(s): "VITAMINB12", "FOLATE", "FERRITIN", "TIBC", "IRON", "RETICCTPCT" in the last 72 hours. Sepsis Labs: No results for input(s): "PROCALCITON", "LATICACIDVEN" in the last 168 hours.  Recent Results (from the past 240 hour(s))  MRSA Next Gen by PCR, Nasal     Status: None   Collection Time: 03/03/23  5:56 PM   Specimen: Nasal Mucosa; Nasal Swab  Result Value Ref Range Status   MRSA by PCR Next Gen NOT DETECTED NOT DETECTED Final    Comment: (NOTE) The GeneXpert MRSA Assay (FDA approved for NASAL specimens only), is one component of a comprehensive MRSA colonization surveillance program. It is not intended to diagnose MRSA infection nor to  guide or monitor treatment for MRSA infections. Test performance is not FDA approved in patients less than 48 years old. Performed at [facility], [address], [place], [place] [address]          Radiology Studies: [facility] Chest [facility] 1 View  Result Date: 03/03/2023 CLINICAL DATA:  Shortness of breath. EXAM: PORTABLE CHEST 1 VIEW COMPARISON:  January 02, 2023. FINDINGS: The heart size and mediastinal contours are within normal limits. Left subclavian Port-A-Cath is unchanged in position. Stable bibasilar densities are noted most consistent with scarring or subsegmental atelectasis. Bilateral upper lobe scarring is noted as well. The visualized skeletal structures are unremarkable. IMPRESSION: Stable bilateral lung opacities are noted concerning for scarring or subsegmental atelectasis. Electronically Signed   By: Lupita Raider M.D.   On: 03/03/2023 15:21        Scheduled Meds:  arformoterol  15 mcg Nebulization BID   azithromycin  500 mg Oral Daily   budesonide (PULMICORT) nebulizer solution  0.5 mg Nebulization BID   Chlorhexidine Gluconate Cloth  6 each Topical Daily   dextromethorphan-guaiFENesin  1 tablet Oral BID   enoxaparin (LOVENOX) injection  40 mg Subcutaneous  Q24H   pantoprazole  40 mg Oral Daily   predniSONE  40 mg Oral Q breakfast     LOS: 1 day    Time spent: 35 minutes    Armstead Heiland Hoover Brunette, DO [facility]  If 7PM-7AM, please contact night-coverage [email or web address] 03/05/2023, 11:05 AM

## 2023-03-06 ENCOUNTER — Other Ambulatory Visit: Payer: Self-pay

## 2023-03-06 ENCOUNTER — Inpatient Hospital Stay (HOSPITAL_COMMUNITY)
Admission: EM | Admit: 2023-03-06 | Discharge: 2023-03-10 | DRG: 190 | Disposition: A | Discharge status: Home Health | Payer: 59 | Attending: Family Medicine | Admitting: Family Medicine

## 2023-03-06 DIAGNOSIS — Z87891 Personal history of nicotine dependence: Secondary | ICD-10-CM

## 2023-03-06 DIAGNOSIS — I5033 Acute on chronic diastolic (congestive) heart failure: Secondary | ICD-10-CM | POA: Diagnosis present

## 2023-03-06 DIAGNOSIS — C569 Malignant neoplasm of unspecified ovary: Secondary | ICD-10-CM | POA: Diagnosis present

## 2023-03-06 DIAGNOSIS — Z79899 Other long term (current) drug therapy: Secondary | ICD-10-CM

## 2023-03-06 DIAGNOSIS — D72829 Elevated white blood cell count, unspecified: Secondary | ICD-10-CM | POA: Insufficient documentation

## 2023-03-06 DIAGNOSIS — I739 Peripheral vascular disease, unspecified: Secondary | ICD-10-CM | POA: Diagnosis present

## 2023-03-06 DIAGNOSIS — T380X5A Adverse effect of glucocorticoids and synthetic analogues, initial encounter: Secondary | ICD-10-CM | POA: Diagnosis present

## 2023-03-06 DIAGNOSIS — J9621 Acute and chronic respiratory failure with hypoxia: Secondary | ICD-10-CM | POA: Diagnosis present

## 2023-03-06 DIAGNOSIS — Z9981 Dependence on supplemental oxygen: Secondary | ICD-10-CM

## 2023-03-06 DIAGNOSIS — J439 Emphysema, unspecified: Secondary | ICD-10-CM | POA: Diagnosis present

## 2023-03-06 DIAGNOSIS — E782 Mixed hyperlipidemia: Secondary | ICD-10-CM | POA: Diagnosis present

## 2023-03-06 DIAGNOSIS — I110 Hypertensive heart disease with heart failure: Secondary | ICD-10-CM | POA: Diagnosis present

## 2023-03-06 DIAGNOSIS — Z7951 Long term (current) use of inhaled steroids: Secondary | ICD-10-CM

## 2023-03-06 DIAGNOSIS — I700 Atherosclerosis of aorta: Secondary | ICD-10-CM | POA: Diagnosis present

## 2023-03-06 DIAGNOSIS — C786 Secondary malignant neoplasm of retroperitoneum and peritoneum: Secondary | ICD-10-CM | POA: Diagnosis present

## 2023-03-06 DIAGNOSIS — Z825 Family history of asthma and other chronic lower respiratory diseases: Secondary | ICD-10-CM

## 2023-03-06 DIAGNOSIS — J441 Chronic obstructive pulmonary disease with (acute) exacerbation: Principal | ICD-10-CM | POA: Diagnosis present

## 2023-03-06 DIAGNOSIS — R7989 Other specified abnormal findings of blood chemistry: Secondary | ICD-10-CM | POA: Insufficient documentation

## 2023-03-06 DIAGNOSIS — Z7982 Long term (current) use of aspirin: Secondary | ICD-10-CM

## 2023-03-06 DIAGNOSIS — K219 Gastro-esophageal reflux disease without esophagitis: Secondary | ICD-10-CM | POA: Diagnosis present

## 2023-03-06 DIAGNOSIS — A310 Pulmonary mycobacterial infection: Secondary | ICD-10-CM | POA: Diagnosis present

## 2023-03-06 DIAGNOSIS — Z8249 Family history of ischemic heart disease and other diseases of the circulatory system: Secondary | ICD-10-CM

## 2023-03-06 DIAGNOSIS — E871 Hypo-osmolality and hyponatremia: Secondary | ICD-10-CM | POA: Diagnosis present

## 2023-03-06 DIAGNOSIS — D509 Iron deficiency anemia, unspecified: Secondary | ICD-10-CM | POA: Diagnosis present

## 2023-03-06 DIAGNOSIS — I10 Essential (primary) hypertension: Secondary | ICD-10-CM | POA: Diagnosis present

## 2023-03-06 DIAGNOSIS — R0600 Dyspnea, unspecified: Principal | ICD-10-CM

## 2023-03-06 LAB — BASIC METABOLIC PANEL
Anion gap: 10 (ref 5–15)
BUN: 15 mg/dL (ref 8–23)
CO2: 32 mmol/L (ref 22–32)
Calcium: 8.4 mg/dL — ABNORMAL LOW (ref 8.9–10.3)
Chloride: 90 mmol/L — ABNORMAL LOW (ref 98–111)
Creatinine, Ser: 0.54 mg/dL (ref 0.44–1.00)
GFR, Estimated: >60 mL/min (ref >60)
Glucose, Bld: 101 mg/dL — ABNORMAL HIGH (ref 70–99)
Potassium: 3.7 mmol/L (ref 3.5–5.1)
Sodium: 132 mmol/L — ABNORMAL LOW (ref 135–145)

## 2023-03-06 LAB — CBC
HCT: 33.3 % — ABNORMAL LOW (ref 36.0–46.0)
Hemoglobin: 10.4 g/dL — ABNORMAL LOW (ref 12.0–15.0)
MCH: 28.2 pg (ref 26.0–34.0)
MCHC: 31.2 g/dL (ref 30.0–36.0)
MCV: 90.2 fL (ref 80.0–100.0)
Platelets: 208 10*3/uL (ref 150–400)
RBC: 3.69 MIL/uL — ABNORMAL LOW (ref 3.87–5.11)
RDW: 17.2 % — ABNORMAL HIGH (ref 11.5–15.5)
WBC: 6.8 10*3/uL (ref 4.0–10.5)
nRBC: 0.3 % — ABNORMAL HIGH (ref 0.0–0.2)

## 2023-03-06 LAB — MAGNESIUM: Magnesium: 1.8 mg/dL (ref 1.7–2.4)

## 2023-03-06 LAB — GLUCOSE, CAPILLARY: Glucose-Capillary: 106 mg/dL — ABNORMAL HIGH (ref 70–99)

## 2023-03-06 MED ORDER — ALPRAZOLAM 0.25 MG PO TABS: 0.2500 mg | ORAL_TABLET | Freq: Three times a day (TID) | ORAL | 0 refills | Status: DC | PRN

## 2023-03-06 MED ORDER — PREDNISONE 10 MG PO TABS: ORAL_TABLET | ORAL | 0 refills | Status: DC

## 2023-03-06 NOTE — ED Triage Notes (Signed)
Pt to ed from home. Pt was seen earlier in [facility] for SOB. Pt states SOB got worse around 7pm. Upon ems arrival pt 88% on 4L [place]. Pt wears 4L [place] chronically. EMS gave albuterol neb tx. Per ems, pt O2 sat 97%. Pt also c/o right flank pain.

## 2023-03-06 NOTE — Care Management Important Message (Signed)
Important Message  Patient Details  Name: [name] MRN: [id number] Date of Birth: 1956-01-06   Medicare Important Message Given:  Yes     Corey Harold 03/06/2023, 10:17 AM

## 2023-03-06 NOTE — Discharge Summary (Signed)
Physician Discharge Summary  [name]:[id number] DOB: 11-Jul-1956 DOA: 03/03/2023  PCP: Abram Sander, MD  Admit date: 03/03/2023  Discharge date: 03/06/2023  Admitted From:Home  Disposition:  Home  Recommendations for Outpatient Follow-up:  Follow up with PCP in 1-2 weeks Follow-up with pulmonology as scheduled Continue on prednisone taper as prescribed Continue home breathing treatments as needed Xanax prescribed for anxiety  Home Health: None  Equipment/Devices: Has chronic 4 L nasal cannula oxygen  Discharge Condition:Stable  CODE STATUS: Full  Diet recommendation: Heart Healthy  Brief/Interim Summary:  [name] is a 67 y.o. female with medical history significant for COPD GOLD stage 4, chronic respiratory failure on 4 L, hypertension, high-grade serous ovarian cancer. Patient presented to the ED with complaints of difficulty breathing ongoing over the past 3 days.  She was admitted with acute on chronic hypoxemic respiratory failure secondary to acute COPD exacerbation and was started on IV steroids as well as breathing treatments and antibiotics.  She is now in stable condition for discharge and is back to her usual baseline.  She will be discharged with a prolonged prednisone taper of approximately 2 weeks and will continue home breathing treatments as needed.  No other acute events or concerns noted.  Discharge Diagnoses:  Principal Problem:   COPD exacerbation Active Problems:   Acute on chronic respiratory failure with hypoxia   Ovarian cancer   Essential hypertension   Peritoneal carcinomatosis   Essential tremor   COPD (chronic obstructive pulmonary disease)  Principal discharge diagnosis: Acute on chronic hypoxemic respiratory failure secondary to acute COPD exacerbation.  Discharge Instructions  Discharge Instructions     Ambulatory Referral for Lung Cancer Scre   Complete by: As directed    Diet - low sodium heart healthy   Complete by: As  directed    Increase activity slowly   Complete by: As directed       Allergies as of 03/06/2023   No Known Allergies      Medication List     STOP taking these medications    cefdinir 300 MG capsule Commonly known as: OMNICEF   morphine 15 MG tablet Commonly known as: MSIR       TAKE these medications    albuterol 108 (90 Base) MCG/ACT inhaler Commonly known as: VENTOLIN HFA Inhale 2 puffs into the lungs every 6 (six) hours as needed for wheezing or shortness of breath.   ALPRAZolam 0.25 MG tablet Commonly known as: XANAX Take 1 tablet (0.25 mg total) by mouth 3 (three) times daily as needed for anxiety.   amLODipine 5 MG tablet Commonly known as: NORVASC Take 5 mg by mouth at bedtime.   aspirin EC 81 MG tablet Take 1 tablet (81 mg total) by mouth daily with breakfast.   atorvastatin 40 MG tablet Commonly known as: LIPITOR Take 1 tablet (40 mg total) by mouth daily.   Breztri Aerosphere 160-9-4.8 MCG/ACT Aero Generic drug: Budeson-Glycopyrrol-Formoterol Inhale 2 puffs into the lungs 2 (two) times daily.   ferrous sulfate 325 (65 FE) MG tablet Take 325 mg by mouth every other day.   gabapentin 600 MG tablet Commonly known as: NEURONTIN Take 1 tablet by mouth 2 (two) times daily.   guaiFENesin-dextromethorphan 100-10 MG/5ML syrup Commonly known as: ROBITUSSIN DM Take 5 mLs by mouth every 4 (four) hours as needed for cough.   ipratropium 17 MCG/ACT inhaler Commonly known as: ATROVENT HFA Inhale 2 puffs into the lungs every 6 (six) hours as needed for  wheezing.   levalbuterol 1.25 MG/3ML nebulizer solution Commonly known as: XOPENEX Take 1.25 mg by nebulization in the morning, at noon, and at bedtime.   magnesium oxide 400 (240 Mg) MG tablet Commonly known as: MAG-OX Take 1 tablet (400 mg total) by mouth 2 (two) times daily.   nortriptyline 25 MG capsule Commonly known as: PAMELOR Take 25 mg by mouth at bedtime.   OXYGEN Inhale 3 L into the  lungs continuous.   pantoprazole 40 MG tablet Commonly known as: Protonix Take 1 tablet (40 mg total) by mouth 2 (two) times daily. What changed: when to take this   potassium chloride 10 MEQ tablet Commonly known as: KLOR-CON M Take 10 mEq by mouth daily.   predniSONE 10 MG tablet Commonly known as: DELTASONE Take 4 tablets (40 mg total) by mouth daily with breakfast for 4 days, THEN 3 tablets (30 mg total) daily with breakfast for 4 days, THEN 2 tablets (20 mg total) daily with breakfast for 4 days, THEN 1 tablet (10 mg total) daily with breakfast for 4 days. Start taking on: March 06, 2023   primidone 50 MG tablet Commonly known as: MYSOLINE Take 50 mg by mouth at bedtime.   Zejula 100 MG tablet Generic drug: niraparib tosylate Take 1 tablet (100 mg total) by mouth daily. May take at bedtime to reduce nausea and vomiting.        Follow-up Information     Abram Sander, MD. Schedule an appointment as soon as possible for a visit in 1 week(s).   Specialty: Family Medicine Contact information: [address] [place] [address] [phone]                No Known Allergies  Consultations: None   Procedures/Studies: DG Chest Port 1 View  Result Date: 03/03/2023 CLINICAL DATA:  Shortness of breath. EXAM: PORTABLE CHEST 1 VIEW COMPARISON:  January 02, 2023. FINDINGS: The heart size and mediastinal contours are within normal limits. Left subclavian Port-A-Cath is unchanged in position. Stable bibasilar densities are noted most consistent with scarring or subsegmental atelectasis. Bilateral upper lobe scarring is noted as well. The visualized skeletal structures are unremarkable. IMPRESSION: Stable bilateral lung opacities are noted concerning for scarring or subsegmental atelectasis. Electronically Signed   By: Lupita Raider M.D.   On: 03/03/2023 15:21     Discharge Exam: Vitals:   03/06/23 0420 03/06/23 0734  BP: (!) 144/78   Pulse: 75 85  Resp:   (!) 22  Temp: 98 F (36.7 C)   SpO2: 100% 97%   Vitals:   03/05/23 2034 03/06/23 0030 03/06/23 0420 03/06/23 0734  BP:  (!) 143/68 (!) 144/78   Pulse:  63 75 85  Resp:  20  (!) 22  Temp:  97.7 F (36.5 C) 98 F (36.7 C)   TempSrc:  Oral Oral   SpO2: 95% 98% 100% 97%  Weight:      Height:        General: Pt is alert, awake, not in acute distress Cardiovascular: RRR, S1/S2 +, no rubs, no gallops Respiratory: CTA bilaterally, no wheezing, no rhonchi, 4 L nasal cannula Abdominal: Soft, NT, ND, bowel sounds + Extremities: no edema, no cyanosis    The results of significant diagnostics from this hospitalization (including imaging, microbiology, ancillary and laboratory) are listed below for reference.     Microbiology: Recent Results (from the past 240 hour(s))  MRSA Next Gen by PCR, Nasal     Status: None  Collection Time: 03/03/23  5:56 PM   Specimen: Nasal Mucosa; Nasal Swab  Result Value Ref Range Status   MRSA by PCR Next Gen NOT DETECTED NOT DETECTED Final    Comment: (NOTE) The GeneXpert MRSA Assay (FDA approved for NASAL specimens only), is one component of a comprehensive MRSA colonization surveillance program. It is not intended to diagnose MRSA infection nor to guide or monitor treatment for MRSA infections. Test performance is not FDA approved in patients less than 35 years old. Performed at [facility], [address] [place], [place] [address]      Labs: BNP (last 3 results) Recent Labs    07/31/22 1745 10/03/22 1817 11/15/22 1310  BNP 29.0 18.0 19.0   Basic Metabolic Panel: Recent Labs  Lab 03/03/23 1601 03/04/23 0420 03/06/23 0405  NA 133* 133* 132*  K 3.9 4.1 3.7  CL 92* 96* 90*  CO2 32 32 32  GLUCOSE 123* 117* 101*  BUN 10 12 15   CREATININE 0.60 0.54 0.54  CALCIUM 8.6* 7.6* 8.4*  MG  --   --  1.8   Liver Function Tests: No results for input(s): "AST", "ALT", "ALKPHOS", "BILITOT", "PROT", "ALBUMIN" in the last 168 hours. No  results for input(s): "LIPASE", "AMYLASE" in the last 168 hours. No results for input(s): "AMMONIA" in the last 168 hours. CBC: Recent Labs  Lab 03/03/23 1601 03/04/23 0420 03/06/23 0405  WBC 10.1 5.5 6.8  HGB 10.4* 8.7* 10.4*  HCT 33.5* 27.5* 33.3*  MCV 90.3 89.6 90.2  PLT 219 175 208   Cardiac Enzymes: No results for input(s): "CKTOTAL", "CKMB", "CKMBINDEX", "TROPONINI" in the last 168 hours. BNP: Invalid input(s): "POCBNP" CBG: Recent Labs  Lab 03/04/23 1139 03/05/23 0333 03/05/23 1156 03/05/23 2354 03/06/23 0751  GLUCAP 109* 130* 118* 118* 106*   D-Dimer No results for input(s): "DDIMER" in the last 72 hours. Hgb A1c No results for input(s): "HGBA1C" in the last 72 hours. Lipid Profile No results for input(s): "CHOL", "HDL", "LDLCALC", "TRIG", "CHOLHDL", "LDLDIRECT" in the last 72 hours. Thyroid function studies No results for input(s): "TSH", "T4TOTAL", "T3FREE", "THYROIDAB" in the last 72 hours.  Invalid input(s): "FREET3" Anemia work up No results for input(s): "VITAMINB12", "FOLATE", "FERRITIN", "TIBC", "IRON", "RETICCTPCT" in the last 72 hours. Urinalysis    Component Value Date/Time   COLORURINE YELLOW 11/15/2022 1345   APPEARANCEUR CLEAR 11/15/2022 1345   APPEARANCEUR Clear 10/08/2013 2007   LABSPEC 1.016 11/15/2022 1345   LABSPEC 1.003 10/08/2013 2007   PHURINE 7.0 11/15/2022 1345   GLUCOSEU NEGATIVE 11/15/2022 1345   GLUCOSEU Negative 10/08/2013 2007   HGBUR SMALL (A) 11/15/2022 1345   BILIRUBINUR NEGATIVE 11/15/2022 1345   BILIRUBINUR Negative 10/08/2013 2007   KETONESUR 20 (A) 11/15/2022 1345   PROTEINUR 30 (A) 11/15/2022 1345   NITRITE NEGATIVE 11/15/2022 1345   LEUKOCYTESUR MODERATE (A) 11/15/2022 1345   LEUKOCYTESUR Negative 10/08/2013 2007   Sepsis Labs Recent Labs  Lab 03/03/23 1601 03/04/23 0420 03/06/23 0405  WBC 10.1 5.5 6.8   Microbiology Recent Results (from the past 240 hour(s))  MRSA Next Gen by PCR, Nasal     Status:  None   Collection Time: 03/03/23  5:56 PM   Specimen: Nasal Mucosa; Nasal Swab  Result Value Ref Range Status   MRSA by PCR Next Gen NOT DETECTED NOT DETECTED Final    Comment: (NOTE) The GeneXpert MRSA Assay (FDA approved for NASAL specimens only), is one component of a comprehensive MRSA colonization surveillance program. It is not intended  to diagnose MRSA infection nor to guide or monitor treatment for MRSA infections. Test performance is not FDA approved in patients less than 26 years old. Performed at [facility], [address], [place], [place] [address]      Time coordinating discharge: 35 minutes  SIGNED:   Erick Blinks, DO [facility] 03/06/2023, 8:52 AM  If 7PM-7AM, please contact night-coverage [email or web address]

## 2023-03-06 NOTE — Telephone Encounter (Signed)
Received confirmation from [facility] that they received O2 orders, closing encounter

## 2023-03-07 ENCOUNTER — Observation Stay (HOSPITAL_COMMUNITY): Payer: 59

## 2023-03-07 ENCOUNTER — Emergency Department (HOSPITAL_COMMUNITY): Payer: 59

## 2023-03-07 ENCOUNTER — Encounter (HOSPITAL_COMMUNITY): Payer: Self-pay | Admitting: Emergency Medicine

## 2023-03-07 ENCOUNTER — Other Ambulatory Visit: Payer: 59

## 2023-03-07 DIAGNOSIS — A310 Pulmonary mycobacterial infection: Secondary | ICD-10-CM

## 2023-03-07 DIAGNOSIS — E782 Mixed hyperlipidemia: Secondary | ICD-10-CM

## 2023-03-07 DIAGNOSIS — E871 Hypo-osmolality and hyponatremia: Secondary | ICD-10-CM | POA: Diagnosis not present

## 2023-03-07 DIAGNOSIS — J441 Chronic obstructive pulmonary disease with (acute) exacerbation: Secondary | ICD-10-CM | POA: Diagnosis present

## 2023-03-07 DIAGNOSIS — I5031 Acute diastolic (congestive) heart failure: Secondary | ICD-10-CM

## 2023-03-07 DIAGNOSIS — R7989 Other specified abnormal findings of blood chemistry: Secondary | ICD-10-CM | POA: Diagnosis not present

## 2023-03-07 DIAGNOSIS — C569 Malignant neoplasm of unspecified ovary: Secondary | ICD-10-CM

## 2023-03-07 DIAGNOSIS — D72829 Elevated white blood cell count, unspecified: Secondary | ICD-10-CM | POA: Insufficient documentation

## 2023-03-07 DIAGNOSIS — I10 Essential (primary) hypertension: Secondary | ICD-10-CM

## 2023-03-07 LAB — BLOOD GAS, ARTERIAL
Acid-Base Excess: 15.1 mmol/L — ABNORMAL HIGH (ref 0.0–2.0)
Bicarbonate: 41.5 mmol/L — ABNORMAL HIGH (ref 20.0–28.0)
Drawn by: 27407
FIO2: 36 %
O2 Saturation: 92.2 %
Patient temperature: 37
pCO2 arterial: 57 mmHg — ABNORMAL HIGH (ref 32–48)
pH, Arterial: 7.47 — ABNORMAL HIGH (ref 7.35–7.45)
pO2, Arterial: 57 mmHg — ABNORMAL LOW (ref 83–108)

## 2023-03-07 LAB — COMPREHENSIVE METABOLIC PANEL
ALT: 16 U/L (ref 0–44)
AST: 22 U/L (ref 15–41)
Albumin: 3.4 g/dL — ABNORMAL LOW (ref 3.5–5.0)
Alkaline Phosphatase: 76 U/L (ref 38–126)
Anion gap: 12 (ref 5–15)
BUN: 16 mg/dL (ref 8–23)
CO2: 33 mmol/L — ABNORMAL HIGH (ref 22–32)
Calcium: 8.5 mg/dL — ABNORMAL LOW (ref 8.9–10.3)
Chloride: 83 mmol/L — ABNORMAL LOW (ref 98–111)
Creatinine, Ser: 0.58 mg/dL (ref 0.44–1.00)
GFR, Estimated: >60 mL/min (ref >60)
Glucose, Bld: 138 mg/dL — ABNORMAL HIGH (ref 70–99)
Potassium: 3.9 mmol/L (ref 3.5–5.1)
Sodium: 128 mmol/L — ABNORMAL LOW (ref 135–145)
Total Bilirubin: 0.3 mg/dL (ref 0.3–1.2)
Total Protein: 6.7 g/dL (ref 6.5–8.1)

## 2023-03-07 LAB — ECHOCARDIOGRAM COMPLETE
AR max vel: 2.69 cm2
AV Area VTI: 3.06 cm2
AV Area mean vel: 2.77 cm2
AV Mean grad: 2.6 mmHg
AV Peak grad: 5.3 mmHg
Ao pk vel: 1.15 m/s
Area-P 1/2: 4.71 cm2
Height: 60 in
S' Lateral: 2.7 cm
Weight: 1851.86 oz

## 2023-03-07 LAB — TROPONIN I (HIGH SENSITIVITY): Troponin I (High Sensitivity): 17 ng/L (ref <18)

## 2023-03-07 LAB — CBC WITH DIFFERENTIAL/PLATELET
Abs Immature Granulocytes: 0.13 10*3/uL — ABNORMAL HIGH (ref 0.00–0.07)
Basophils Absolute: 0.1 10*3/uL (ref 0.0–0.1)
Basophils Relative: 0 %
Eosinophils Absolute: 0 10*3/uL (ref 0.0–0.5)
Eosinophils Relative: 0 %
HCT: 31.8 % — ABNORMAL LOW (ref 36.0–46.0)
Hemoglobin: 10.3 g/dL — ABNORMAL LOW (ref 12.0–15.0)
Immature Granulocytes: 1 %
Lymphocytes Relative: 8 %
Lymphs Abs: 1.1 10*3/uL (ref 0.7–4.0)
MCH: 28.1 pg (ref 26.0–34.0)
MCHC: 32.4 g/dL (ref 30.0–36.0)
MCV: 86.6 fL (ref 80.0–100.0)
Monocytes Absolute: 1.3 10*3/uL — ABNORMAL HIGH (ref 0.1–1.0)
Monocytes Relative: 10 %
Neutro Abs: 10.7 10*3/uL — ABNORMAL HIGH (ref 1.7–7.7)
Neutrophils Relative %: 81 %
Platelets: 216 10*3/uL (ref 150–400)
RBC: 3.67 MIL/uL — ABNORMAL LOW (ref 3.87–5.11)
RDW: 16.7 % — ABNORMAL HIGH (ref 11.5–15.5)
WBC: 13.3 10*3/uL — ABNORMAL HIGH (ref 4.0–10.5)
nRBC: 0.2 % (ref 0.0–0.2)

## 2023-03-07 LAB — PHOSPHORUS: Phosphorus: 3.1 mg/dL (ref 2.5–4.6)

## 2023-03-07 LAB — OSMOLALITY: Osmolality: 276 mOsm/kg (ref 275–295)

## 2023-03-07 LAB — BRAIN NATRIURETIC PEPTIDE: B Natriuretic Peptide: 201 pg/mL — ABNORMAL HIGH (ref 0.0–100.0)

## 2023-03-07 MED ORDER — AMLODIPINE BESYLATE 5 MG PO TABS
5.0000 mg | ORAL_TABLET | Freq: Every day | ORAL | Status: DC
  Administered 2023-03-07 – 2023-03-09 (×3): 5 mg via ORAL
  Filled 2023-03-07 (×3): qty 1

## 2023-03-07 MED ORDER — ONDANSETRON HCL 4 MG/2ML IJ SOLN: 4.0000 mg | Freq: Four times a day (QID) | INTRAMUSCULAR | Status: DC | PRN

## 2023-03-07 MED ORDER — PANTOPRAZOLE SODIUM 40 MG PO TBEC
40.0000 mg | DELAYED_RELEASE_TABLET | Freq: Every day | ORAL | Status: DC
  Administered 2023-03-07 – 2023-03-10 (×4): 40 mg via ORAL
  Filled 2023-03-07 (×4): qty 1

## 2023-03-07 MED ORDER — FUROSEMIDE 10 MG/ML IJ SOLN
20.0000 mg | Freq: Every day | INTRAMUSCULAR | Status: DC
  Administered 2023-03-08 – 2023-03-10 (×3): 20 mg via INTRAVENOUS
  Filled 2023-03-07 (×3): qty 2

## 2023-03-07 MED ORDER — ACETAMINOPHEN 650 MG RE SUPP
650.0000 mg | Freq: Four times a day (QID) | RECTAL | Status: DC | PRN
  Filled 2023-03-07 (×4): qty 1

## 2023-03-07 MED ORDER — ACETAMINOPHEN 325 MG PO TABS
650.0000 mg | ORAL_TABLET | Freq: Four times a day (QID) | ORAL | Status: DC | PRN
  Administered 2023-03-07 – 2023-03-08 (×3): 650 mg via ORAL
  Filled 2023-03-07 (×6): qty 2

## 2023-03-07 MED ORDER — SODIUM CHLORIDE 0.9 % IV SOLN
500.0000 mg | INTRAVENOUS | Status: DC
  Administered 2023-03-07 – 2023-03-10 (×4): 500 mg via INTRAVENOUS
  Filled 2023-03-07 (×4): qty 5

## 2023-03-07 MED ORDER — HYDROCODONE-ACETAMINOPHEN 5-325 MG PO TABS
1.0000 | ORAL_TABLET | Freq: Once | ORAL | Status: AC
  Administered 2023-03-07: 1 via ORAL
  Filled 2023-03-07: qty 1

## 2023-03-07 MED ORDER — NIRAPARIB TOSYLATE 100 MG PO TABS: 100.0000 mg | ORAL_TABLET | Freq: Every day | ORAL | Status: DC

## 2023-03-07 MED ORDER — METHYLPREDNISOLONE SODIUM SUCC 40 MG IJ SOLR
40.0000 mg | Freq: Two times a day (BID) | INTRAMUSCULAR | Status: DC
  Administered 2023-03-07 – 2023-03-10 (×7): 40 mg via INTRAVENOUS
  Filled 2023-03-07 (×7): qty 1

## 2023-03-07 MED ORDER — FERROUS SULFATE 325 (65 FE) MG PO TABS
325.0000 mg | ORAL_TABLET | ORAL | Status: DC
  Administered 2023-03-08 – 2023-03-10 (×2): 325 mg via ORAL
  Filled 2023-03-07 (×3): qty 1

## 2023-03-07 MED ORDER — IOHEXOL 350 MG/ML SOLN
100.0000 mL | Freq: Once | INTRAVENOUS | Status: AC | PRN
  Administered 2023-03-07: 100 mL via INTRAVENOUS

## 2023-03-07 MED ORDER — IPRATROPIUM-ALBUTEROL 0.5-2.5 (3) MG/3ML IN SOLN
3.0000 mL | Freq: Four times a day (QID) | RESPIRATORY_TRACT | Status: DC
  Administered 2023-03-07 – 2023-03-10 (×14): 3 mL via RESPIRATORY_TRACT
  Filled 2023-03-07 (×14): qty 3

## 2023-03-07 MED ORDER — FUROSEMIDE 10 MG/ML IJ SOLN
20.0000 mg | Freq: Once | INTRAMUSCULAR | Status: AC
  Administered 2023-03-07: 20 mg via INTRAVENOUS
  Filled 2023-03-07: qty 2

## 2023-03-07 MED ORDER — AZITHROMYCIN 250 MG PO TABS: 500.0000 mg | ORAL_TABLET | Freq: Every day | ORAL | Status: DC

## 2023-03-07 MED ORDER — FENTANYL CITRATE PF 50 MCG/ML IJ SOSY
12.5000 ug | PREFILLED_SYRINGE | Freq: Once | INTRAMUSCULAR | Status: AC
  Administered 2023-03-07: 12.5 ug via INTRAVENOUS
  Filled 2023-03-07: qty 1

## 2023-03-07 MED ORDER — IPRATROPIUM-ALBUTEROL 0.5-2.5 (3) MG/3ML IN SOLN: 3.0000 mL | RESPIRATORY_TRACT | Status: DC | PRN

## 2023-03-07 MED ORDER — DM-GUAIFENESIN ER 30-600 MG PO TB12
1.0000 | ORAL_TABLET | Freq: Two times a day (BID) | ORAL | Status: DC
  Administered 2023-03-07 – 2023-03-10 (×7): 1 via ORAL
  Filled 2023-03-07 (×7): qty 1

## 2023-03-07 MED ORDER — HYDROCODONE-ACETAMINOPHEN 5-325 MG PO TABS
1.0000 | ORAL_TABLET | Freq: Three times a day (TID) | ORAL | Status: DC | PRN
  Administered 2023-03-07 – 2023-03-10 (×7): 1 via ORAL
  Filled 2023-03-07 (×7): qty 1

## 2023-03-07 MED ORDER — ENOXAPARIN SODIUM 40 MG/0.4ML IJ SOSY
40.0000 mg | PREFILLED_SYRINGE | INTRAMUSCULAR | Status: DC
  Administered 2023-03-07 – 2023-03-10 (×4): 40 mg via SUBCUTANEOUS
  Filled 2023-03-07 (×4): qty 0.4

## 2023-03-07 MED ORDER — ATORVASTATIN CALCIUM 40 MG PO TABS
40.0000 mg | ORAL_TABLET | Freq: Every day | ORAL | Status: DC
  Administered 2023-03-07 – 2023-03-10 (×4): 40 mg via ORAL
  Filled 2023-03-07 (×4): qty 1

## 2023-03-07 MED ORDER — ONDANSETRON HCL 4 MG PO TABS: 4.0000 mg | ORAL_TABLET | Freq: Four times a day (QID) | ORAL | Status: DC | PRN

## 2023-03-07 MED ORDER — IPRATROPIUM-ALBUTEROL 0.5-2.5 (3) MG/3ML IN SOLN
3.0000 mL | RESPIRATORY_TRACT | Status: AC
  Administered 2023-03-07 (×3): 3 mL via RESPIRATORY_TRACT
  Filled 2023-03-07: qty 6
  Filled 2023-03-07: qty 3

## 2023-03-07 MED ORDER — AZITHROMYCIN 250 MG PO TABS: 250.0000 mg | ORAL_TABLET | Freq: Every day | ORAL | Status: DC

## 2023-03-07 NOTE — Progress Notes (Signed)
  Echocardiogram 2D Echocardiogram has been performed.  [name] 03/07/2023, 2:08 PM

## 2023-03-07 NOTE — H&P (Signed)
History and Physical    Patient: [name]:[id number] DOB: 03-30-1956 DOA: 03/06/2023 DOS: the patient was seen and examined on 03/07/2023 PCP: Abram Sander, MD  Patient coming from: Home  Chief Complaint:  Chief Complaint  Patient presents with   Shortness of Breath   HPI: [name] is a 67 y.o. female with medical history significant of chronic respiratory failure with hypoxia on supplemental oxygen via [place] at 4 LPM, COPD Gold stage IV, high-grade serous ovarian cancer, hypertension, hyperlipidemia, GERD who presents to the emergency department due to shortness of breath. Patient was admitted on 4/15 and was discharged on 4/18 due to acute on chronic respiratory failure with hypoxia secondary to COPD exacerbation which was treated with breathing treatment, antibiotics and steroids.  Patient states that on returning home, shortness of breath worsens again, she tried home inhaler without any improvement in symptoms, so she activated EMS and patient was taken to the ED for further evaluation and management.  Patient was not aware of anything that may have triggered the exacerbation, she endorsed the boyfriend smokes cigarettes, but he usually smokes outside the house.  She complained of pain on right side of upper and lower back which started with the symptoms yesterday.  She denies seasonal allergies, leg swelling, fever, nausea, vomiting.  ED Course:  In the emergency department, she was tachypneic and tachycardic, BP was 181/107, temperature 98.4 F, O2 sat was 91% on 4 LPM of oxygen.  In the ED showed normocytic anemia, WBC 13.3.  BMP showed sodium 128, potassium 3.9, chloride 83, bicarb 33, blood glucose 138, BUN 16, creatinine 0.58, albumin 3.4, magnesium 1.8, troponin x 1 - 17, BNP 201. Chest x-ray showed No acute chest findings.  COPD with biapical scarring change.  Aortic atherosclerosis. She was treated with IV Lasix 20 mg x 1, DuoNeb was provided and IV fentanyl at 0.5 mcg x 1  was given due to right-sided back pain.  Hospitalist was asked to admit patient for further evaluation and management.   Review of Systems: Review of systems as noted in the HPI. All other systems reviewed and are negative.   Past Medical History:  Diagnosis Date   Anemia    Aortic atherosclerosis    Arthritis    Asthma    Bilateral carotid artery stenosis    Cancer    Gastric Cancer   COPD (chronic obstructive pulmonary disease)    High cholesterol    Hyperlipidemia    Hypertension    Neuropathy    Osteoarthritis    Ovarian cancer    Oxygen dependent    Peripheral vascular disease    Peritoneal carcinoma    Pneumonia 2019   Port-A-Cath in place 07/03/2020   Pulmonary emphysema    Past Surgical History:  Procedure Laterality Date   BIOPSY  06/30/2022   Procedure: BIOPSY;  Surgeon: Lanelle Bal, DO;  [facility]: [facility];  Service: Endoscopy;;  gastric   BIOPSY  09/03/2022   Procedure: BIOPSY;  Surgeon: Dolores Frame, MD;  Location: [facility];  Service: Gastroenterology;;   COLONOSCOPY WITH PROPOFOL N/A 09/03/2022   Procedure: COLONOSCOPY WITH PROPOFOL;  Surgeon: Dolores Frame, MD;  [facility]: [facility];  Service: Gastroenterology;  Laterality: N/A;  900 ASA 3   ESOPHAGEAL BRUSHING  09/03/2022   Procedure: ESOPHAGEAL BRUSHING;  Surgeon: Marguerita Merles, Reuel Boom, MD;  Location: [facility];  Service: Gastroenterology;;   ESOPHAGOGASTRODUODENOSCOPY (EGD) WITH PROPOFOL N/A 02/17/2021   Procedure: ESOPHAGOGASTRODUODENOSCOPY (  EGD) WITH PROPOFOL;  Surgeon: Marguerita Merles, Reuel Boom, MD;  [facility]: [facility];  Service: Gastroenterology;  Laterality: N/A;   ESOPHAGOGASTRODUODENOSCOPY (EGD) WITH PROPOFOL N/A 05/04/2022   Procedure: ESOPHAGOGASTRODUODENOSCOPY (EGD) WITH PROPOFOL;  Surgeon: Corbin Ade, MD;  [facility]: [facility];  Service: Endoscopy;  Laterality: N/A;   ESOPHAGOGASTRODUODENOSCOPY (EGD) WITH PROPOFOL N/A 06/30/2022    Procedure: ESOPHAGOGASTRODUODENOSCOPY (EGD) WITH PROPOFOL;  Surgeon: Lanelle Bal, DO;  [facility];  Service: Endoscopy;  Laterality: N/A;   ESOPHAGOGASTRODUODENOSCOPY (EGD) WITH PROPOFOL N/A 09/03/2022   Procedure: ESOPHAGOGASTRODUODENOSCOPY (EGD) WITH PROPOFOL;  Surgeon: Dolores Frame, MD;  [facility]: [facility];  Service: Gastroenterology;  Laterality: N/A;   HALLUX VALGUS BASE WEDGE Right 06/09/2015   Procedure: Base wedge osteotomy with modified McBride right foot ;  Surgeon: Linus Galas, MD;  Location: [facility];  Service: Podiatry;  Laterality: Right;   PORTACATH PLACEMENT Left 06/28/2020   Procedure: PORT-A-CATHETER PLACEMENT LEFT CHEST (attached catheter in left subclavian);  Surgeon: Lucretia Roers, MD;  Location: [facility];  Service: General;  Laterality: Left;   TUBAL LIGATION     VIDEO BRONCHOSCOPY WITH ENDOBRONCHIAL NAVIGATION N/A 03/09/2021   Procedure: VIDEO BRONCHOSCOPY WITH ENDOBRONCHIAL NAVIGATION;  Surgeon: Vida Rigger, MD;  Location: [facility];  Service: Thoracic;  Laterality: N/A;   VIDEO BRONCHOSCOPY WITH ENDOBRONCHIAL ULTRASOUND N/A 03/09/2021   Procedure: VIDEO BRONCHOSCOPY WITH ENDOBRONCHIAL ULTRASOUND;  Surgeon: Vida Rigger, MD;  Location: [facility];  Service: Thoracic;  Laterality: N/A;    Social History:  reports that she quit smoking about 9 years ago. Her smoking use included cigarettes. She has a 60.00 pack-year smoking history. She has never used smokeless tobacco. She reports that she does not drink alcohol and does not use drugs.   No Known Allergies  Family History  Problem Relation Age of Onset   Alzheimer's disease Mother    COPD Father    Emphysema Father    Hypertension Father    Healthy Sister    Healthy Brother    Alzheimer's disease Maternal Grandmother    Healthy Sister    Healthy Sister    Healthy Sister    Prostate cancer Other        paternal grandmother's brother; dx in early 15s   Breast cancer Neg Hx       Prior to Admission medications   Medication Sig Start Date End Date Taking? Authorizing Provider  albuterol (VENTOLIN HFA) 108 (90 Base) MCG/ACT inhaler Inhale 2 puffs into the lungs every 6 (six) hours as needed for wheezing or shortness of breath. 07/01/22   Shon Hale, MD  ALPRAZolam Prudy Feeler) 0.25 MG tablet Take 1 tablet (0.25 mg total) by mouth 3 (three) times daily as needed for anxiety. 03/06/23   Sherryll Burger, Pratik D, DO  amLODipine (NORVASC) 5 MG tablet Take 5 mg by mouth at bedtime.     [provider]  aspirin EC 81 MG tablet Take 1 tablet (81 mg total) by mouth daily with breakfast. 07/01/22   Mariea Clonts, Courage, MD  atorvastatin (LIPITOR) 40 MG tablet Take 1 tablet (40 mg total) by mouth daily. 01/02/22   Noralee Stain, DO  Budeson-Glycopyrrol-Formoterol (BREZTRI AEROSPHERE) 160-9-4.8 MCG/ACT AERO Inhale 2 puffs into the lungs 2 (two) times daily. 09/10/22   Nyoka Cowden, MD  ferrous sulfate 325 (65 FE) MG tablet Take 325 mg by mouth every other day.    [provider]  gabapentin (NEURONTIN) 600 MG tablet Take 1 tablet by mouth 2 (two)  times daily. Patient not taking: Reported on 03/04/2023 10/17/22   [provider]  guaiFENesin-dextromethorphan (ROBITUSSIN DM) 100-10 MG/5ML syrup Take 5 mLs by mouth every 4 (four) hours as needed for cough. Patient not taking: Reported on 03/04/2023 01/03/23   Maurilio Lovely D, DO  ipratropium (ATROVENT HFA) 17 MCG/ACT inhaler Inhale 2 puffs into the lungs every 6 (six) hours as needed for wheezing.    [provider]  levalbuterol Pauline Aus) 1.25 MG/3ML nebulizer solution Take 1.25 mg by nebulization in the morning, at noon, and at bedtime. 11/08/22   Johnson, Clanford L, MD  magnesium oxide (MAG-OX) 400 (240 Mg) MG tablet Take 1 tablet (400 mg total) by mouth 2 (two) times daily. 09/06/22   Doreatha Massed, MD  niraparib tosylate (ZEJULA) 100 MG tablet Take 1 tablet (100 mg total) by mouth daily. May take at  bedtime to reduce nausea and vomiting. 12/02/22   Doreatha Massed, MD  nortriptyline (PAMELOR) 25 MG capsule Take 25 mg by mouth at bedtime.    [provider]  OXYGEN Inhale 3 L into the lungs continuous.    [provider]  pantoprazole (PROTONIX) 40 MG tablet Take 1 tablet (40 mg total) by mouth 2 (two) times daily. Patient taking differently: Take 40 mg by mouth daily. 07/01/22 03/04/23  Shon Hale, MD  potassium chloride (KLOR-CON M) 10 MEQ tablet Take 10 mEq by mouth daily. 03/03/23   [provider]  predniSONE (DELTASONE) 10 MG tablet Take 4 tablets (40 mg total) by mouth daily with breakfast for 4 days, THEN 3 tablets (30 mg total) daily with breakfast for 4 days, THEN 2 tablets (20 mg total) daily with breakfast for 4 days, THEN 1 tablet (10 mg total) daily with breakfast for 4 days. 03/06/23 03/22/23  Sherryll Burger, Pratik D, DO  primidone (MYSOLINE) 50 MG tablet Take 50 mg by mouth at bedtime.    [provider]    Physical Exam: BP (!) 164/96   Pulse (!) 105   Temp 98.4 F (36.9 C) (Oral)   Resp (!) 22   Ht 5' (1.524 m)   Wt 52.5 kg   SpO2 (!) 89%   BMI 22.60 kg/m   General: 67 y.o. year-old female well developed well nourished in no acute distress.  Alert and oriented x3. HEENT: NCAT, EOMI, dry mucous membrane Neck: Supple, trachea medial Cardiovascular: Tachycardia.  Diffuse wheezing on auscultation.  Regular rate and rhythm with no rubs or gallops.  No thyromegaly or JVD noted.  No lower extremity edema. 2/4 pulses in all 4 extremities. Respiratory: Clear to auscultation with no wheezes or rales. Good inspiratory effort. Abdomen: Soft, nontender nondistended with normal bowel sounds x4 quadrants. Muskuloskeletal: No cyanosis, clubbing or edema noted bilaterally Neuro: CN II-XII intact, strength 5/5 x 4, sensation, reflexes intact Skin: No ulcerative lesions noted or rashes Psychiatry: Judgement and insight appear normal. Mood is  appropriate for condition and setting          Labs on Admission:  Basic Metabolic Panel: Recent Labs  Lab 03/03/23 1601 03/04/23 0420 03/06/23 0405 03/07/23 0237  NA 133* 133* 132* 128*  K 3.9 4.1 3.7 3.9  CL 92* 96* 90* 83*  CO2 32 32 32 33*  GLUCOSE 123* 117* 101* 138*  BUN 10 12 15 16   CREATININE 0.60 0.54 0.54 0.58  CALCIUM 8.6* 7.6* 8.4* 8.5*  MG  --   --  1.8  --    Liver Function Tests: Recent Labs  Lab 03/07/23  0237  AST 22  ALT 16  ALKPHOS 76  BILITOT 0.3  PROT 6.7  ALBUMIN 3.4*   No results for input(s): "LIPASE", "AMYLASE" in the last 168 hours. No results for input(s): "AMMONIA" in the last 168 hours. CBC: Recent Labs  Lab 03/03/23 1601 03/04/23 0420 03/06/23 0405 03/07/23 0237  WBC 10.1 5.5 6.8 13.3*  NEUTROABS  --   --   --  10.7*  HGB 10.4* 8.7* 10.4* 10.3*  HCT 33.5* 27.5* 33.3* 31.8*  MCV 90.3 89.6 90.2 86.6  PLT 219 175 208 216   Cardiac Enzymes: No results for input(s): "CKTOTAL", "CKMB", "CKMBINDEX", "TROPONINI" in the last 168 hours.  BNP (last 3 results) Recent Labs    10/03/22 1817 11/15/22 1310 03/07/23 0237  BNP 18.0 19.0 201.0*    ProBNP (last 3 results) No results for input(s): "PROBNP" in the last 8760 hours.  CBG: Recent Labs  Lab 03/04/23 1139 03/05/23 0333 03/05/23 1156 03/05/23 2354 03/06/23 0751  GLUCAP 109* 130* 118* 118* 106*    Radiological Exams on Admission: CT Angio Chest PE W and/or Wo Contrast  Result Date: 03/07/2023 CLINICAL DATA:  67 year old female history of worsening shortness of breath. EXAM: CT ANGIOGRAPHY CHEST WITH CONTRAST TECHNIQUE: Multidetector CT imaging of the chest was performed using the standard protocol during bolus administration of intravenous contrast. Multiplanar CT image reconstructions and MIPs were obtained to evaluate the vascular anatomy. RADIATION DOSE REDUCTION: This exam was performed according to the departmental dose-optimization program which includes automated  exposure control, adjustment of the mA and/or kV according to patient size and/or use of iterative reconstruction technique. CONTRAST:  OMNIPAQUE IOHEXOL 350 MG/ML SOLN COMPARISON:  Chest CTA 11/15/2022. FINDINGS: Cardiovascular: No filling defects within the pulmonary arterial tree to suggest pulmonary embolism. Heart size is normal. There is no significant pericardial fluid, thickening or pericardial calcification. There is aortic atherosclerosis, as well as atherosclerosis of the great vessels of the mediastinum and the coronary arteries, including calcified atherosclerotic plaque in the left main, left anterior descending, left circumflex and right coronary arteries. Mediastinum/Nodes: No pathologically enlarged mediastinal or hilar lymph nodes. Esophagus is unremarkable in appearance. No axillary lymphadenopathy. Lungs/Pleura: Diffuse bronchial wall thickening with thickening of the peribronchovascular interstitium, scattered areas of cylindrical bronchiectasis, peripheral bronchiolectasis, and extensive widespread peribronchovascular micro and macronodularity, most compatible with widespread areas of mucoid impaction within terminal bronchioles in the setting of chronic atypical infection. No larger more suspicious appearing pulmonary nodules or masses are noted. No confluent consolidative airspace disease. No pleural effusions. There is also a background of moderate centrilobular and paraseptal emphysema. Upper Abdomen: Aortic atherosclerosis. Musculoskeletal: There are no aggressive appearing lytic or blastic lesions noted in the visualized portions of the skeleton. Review of the MIP images confirms the above findings. IMPRESSION: 1. No evidence of pulmonary embolism. 2. The appearance of the chest is compatible with chronic atypical infection, such as mycobacterium avium intracellulare (MAI). Outpatient referral to Pulmonology for further clinical evaluation is recommended. 3. Moderate centrilobular  and paraseptal emphysema; imaging findings suggestive of underlying COPD. 4. Aortic atherosclerosis, in addition to left main and three-vessel coronary artery disease. Please note that although the presence of coronary artery calcium documents the presence of coronary artery disease, the severity of this disease and any potential stenosis cannot be assessed on this non-gated CT examination. Assessment for potential risk factor modification, dietary therapy or pharmacologic therapy may be warranted, if clinically indicated. Aortic Atherosclerosis (ICD10-I70.0) and Emphysema (ICD10-J43.9). Electronically Signed   By: Reuel Boom  Entrikin M.D.   On: 03/07/2023 06:13   DG Chest Portable 1 View  Result Date: 03/07/2023 CLINICAL DATA:  Shortness of breath. EXAM: PORTABLE CHEST 1 VIEW COMPARISON:  Portable chest 03/03/2023 FINDINGS: The lungs are emphysematous with biapical scarring change, additional linear scarring or atelectasis in the lateral base. No focal pneumonia is evident. Overall aeration seems unchanged. There is no pleural effusion. Mediastinum is stable. The aortic arch is heavily calcified. Heart size and vascular pattern are normal. Osteopenia. No acute osseous findings or changes. Healed fracture deformity of posterolateral left 6th rib. Left chest subclavian approach port catheter again terminates at the superior cavoatrial junction. IMPRESSION: No acute chest findings. COPD with biapical scarring change. Aortic atherosclerosis. Electronically Signed   By: Almira Bar M.D.   On: 03/07/2023 01:38    EKG: I independently viewed the EKG done and my findings are as followed: Normal sinus rhythm at a rate of 99 bpm  Assessment/Plan Present on Admission:  Acute exacerbation of chronic obstructive pulmonary disease (COPD)  Hyponatremia  Ovarian cancer  Essential hypertension  Mixed hyperlipidemia  Principal Problem:   Acute exacerbation of chronic obstructive pulmonary disease (COPD) Active  Problems:   Ovarian cancer   Essential hypertension   Mixed hyperlipidemia   Hyponatremia   Elevated brain natriuretic peptide (BNP) level   Leukocytosis   Acute exacerbation of COPD Chronic respiratory failure with hypoxia Continue duo nebs, Mucinex, Solu-Medrol, azithromycin. 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 Ellin Saba Continue niraparib  Iron deficiency anemia Continue ferrous sulfate   DVT prophylaxis: Lovenox  Code Status: Full code  Consults: None  Family Communication: None at bedside  Severity of Illness: The appropriate patient status for this patient is OBSERVATION. Observation status is judged to be reasonable and necessary in order to provide the required intensity of service to ensure the patient's safety. The patient's presenting symptoms, physical exam  findings, and initial radiographic and laboratory data in the context of their medical condition is felt to place them at decreased risk for further clinical deterioration. Furthermore, it is anticipated that the patient will be medically stable for discharge from the hospital within 2 midnights of admission.   Author: Frankey Shown, DO 03/07/2023 6:35 AM  For on call review [email or web address].

## 2023-03-07 NOTE — ED Notes (Signed)
Pt gone to CT 

## 2023-03-07 NOTE — Care Management Obs Status (Signed)
MEDICARE OBSERVATION STATUS NOTIFICATION   Patient Details  Name: [name] MRN: [id number] Date of Birth: Mar 20, 1956   Medicare Observation Status Notification Given:  Yes    Corey Harold 03/07/2023, 3:33 PM

## 2023-03-07 NOTE — ED Provider Notes (Signed)
[place] EMERGENCY DEPARTMENT AT [facility] Provider Note   CSN: [id number] Arrival date & time: 03/06/23  2349     History {Add pertinent medical, surgical, social history, OB history to HPI:1} Chief Complaint  Patient presents with   Shortness of Breath    [name] is a 67 y.o. female.  67 year old female history of COPD the presents the ER today secondary to shortness of breath.  Patient was recently admitted to the hospital and was discharged this afternoon.  She was admitted for COPD exacerbation.  She states that shortly after get home her breathing got worse again.  She tried an inhaler did not seem to help.  She called EMS who brought her here for further evaluation.  I discussed with her that there was anything at home that could be causing her to have exacerbations like this.  She states that her boyfriend smokes but smokes outside.  She states that she does not smoke.  She does not have any Seasonal allergies.  No fevers.  No lower extremity swelling.  Nothing else is changed since she left the hospital earlier today.   Shortness of Breath      Home Medications Prior to Admission medications   Medication Sig Start Date End Date Taking? Authorizing Provider  albuterol (VENTOLIN HFA) 108 (90 Base) MCG/ACT inhaler Inhale 2 puffs into the lungs every 6 (six) hours as needed for wheezing or shortness of breath. 07/01/22   Shon Hale, MD  ALPRAZolam Prudy Feeler) 0.25 MG tablet Take 1 tablet (0.25 mg total) by mouth 3 (three) times daily as needed for anxiety. 03/06/23   Sherryll Burger, Pratik D, DO  amLODipine (NORVASC) 5 MG tablet Take 5 mg by mouth at bedtime.     [provider]  aspirin EC 81 MG tablet Take 1 tablet (81 mg total) by mouth daily with breakfast. 07/01/22   Mariea Clonts, Courage, MD  atorvastatin (LIPITOR) 40 MG tablet Take 1 tablet (40 mg total) by mouth daily. 01/02/22   Noralee Stain, DO  Budeson-Glycopyrrol-Formoterol (BREZTRI AEROSPHERE)  160-9-4.8 MCG/ACT AERO Inhale 2 puffs into the lungs 2 (two) times daily. 09/10/22   Nyoka Cowden, MD  ferrous sulfate 325 (65 FE) MG tablet Take 325 mg by mouth every other day.    [provider]  gabapentin (NEURONTIN) 600 MG tablet Take 1 tablet by mouth 2 (two) times daily. Patient not taking: Reported on 03/04/2023 10/17/22   [provider]  guaiFENesin-dextromethorphan (ROBITUSSIN DM) 100-10 MG/5ML syrup Take 5 mLs by mouth every 4 (four) hours as needed for cough. Patient not taking: Reported on 03/04/2023 01/03/23   Maurilio Lovely D, DO  ipratropium (ATROVENT HFA) 17 MCG/ACT inhaler Inhale 2 puffs into the lungs every 6 (six) hours as needed for wheezing.    [provider]  levalbuterol Pauline Aus) 1.25 MG/3ML nebulizer solution Take 1.25 mg by nebulization in the morning, at noon, and at bedtime. 11/08/22   Johnson, Clanford L, MD  magnesium oxide (MAG-OX) 400 (240 Mg) MG tablet Take 1 tablet (400 mg total) by mouth 2 (two) times daily. 09/06/22   Doreatha Massed, MD  niraparib tosylate (ZEJULA) 100 MG tablet Take 1 tablet (100 mg total) by mouth daily. May take at bedtime to reduce nausea and vomiting. 12/02/22   Doreatha Massed, MD  nortriptyline (PAMELOR) 25 MG capsule Take 25 mg by mouth at bedtime.    [provider]  OXYGEN Inhale 3 L into the lungs continuous.    [provider]  pantoprazole (PROTONIX) 40 MG tablet Take 1 tablet (40 mg total) by mouth 2 (two) times daily. Patient taking differently: Take 40 mg by mouth daily. 07/01/22 03/04/23  Shon Hale, MD  potassium chloride (KLOR-CON M) 10 MEQ tablet Take 10 mEq by mouth daily. 03/03/23   [provider]  predniSONE (DELTASONE) 10 MG tablet Take 4 tablets (40 mg total) by mouth daily with breakfast for 4 days, THEN 3 tablets (30 mg total) daily with breakfast for 4 days, THEN 2 tablets (20 mg total) daily with breakfast for 4 days, THEN 1 tablet (10 mg total) daily  with breakfast for 4 days. 03/06/23 03/22/23  Sherryll Burger, Pratik D, DO  primidone (MYSOLINE) 50 MG tablet Take 50 mg by mouth at bedtime.    [provider]      Allergies    Patient has no known allergies.    Review of Systems   Review of Systems  Respiratory:  Positive for shortness of breath.     Physical Exam Updated Vital Signs BP (!) 181/107 (BP Location: Left Arm)   Pulse (!) 107   Temp 98.4 F (36.9 C) (Oral)   Resp (!) 22   Ht 5' (1.524 m)   Wt 52.5 kg   SpO2 91%   BMI 22.60 kg/m  Physical Exam Vitals and nursing note reviewed.  Constitutional:      Appearance: She is well-developed.  HENT:     Head: Normocephalic and atraumatic.  Cardiovascular:     Rate and Rhythm: Normal rate and regular rhythm.  Pulmonary:     Effort: Tachypnea present. No respiratory distress.     Breath sounds: No stridor. Decreased breath sounds and wheezing present.  Abdominal:     General: There is no distension.  Musculoskeletal:        General: Normal range of motion.     Cervical back: Normal range of motion.     Right lower leg: No edema.     Left lower leg: No edema.  Skin:    General: Skin is warm and dry.  Neurological:     Mental Status: She is alert.     ED Results / Procedures / Treatments   Labs (all labs ordered are listed, but only abnormal results are displayed) Labs Reviewed - No data to display  EKG None  Radiology No results found.  Procedures Procedures    Medications Ordered in ED Medications  ipratropium-albuterol (DUONEB) 0.5-2.5 (3) MG/3ML nebulizer solution 3 mL (3 mLs Nebulization Given 03/07/23 0125)    ED Course/ Medical Decision Making/ A&P   {   Click here for ABCD2, HEART and other calculatorsREFRESH Note before signing :1}                          Medical Decision Making Amount and/or Complexity of Data Reviewed Radiology: ordered.  Risk Prescription drug management.  Will repeat x-ray to make sure is no pneumothorax  otherwise I would not suspect there will be too much of a change.  Will give some breathing treatments and reassess.  If she is still breathing fast that she is and is diminished as she is she may need to be readmitted. ***  {Document critical care time when appropriate:1} {Document review of labs and clinical decision tools ie heart score, Chads2Vasc2 etc:1}  {Document your independent review of radiology images, and any outside records:1} {Document your discussion with family members, caretakers, and with consultants:1} {Document social  determinants of health affecting pt's care:1} {Document your decision making why or why not admission, treatments were needed:1} Final Clinical Impression(s) / ED Diagnoses Final diagnoses:  None    Rx / DC Orders ED Discharge Orders     None

## 2023-03-07 NOTE — Progress Notes (Signed)
Pt requested exam be done later, due to nausea.

## 2023-03-07 NOTE — Progress Notes (Signed)
Patient seen and evaluated, chart reviewed, please see EMR for updated orders. Please see full H&P dictated by admitting physician Dr Thomes Dinning for same date of service.    Brief Summary:-  67 y.o. female with medical history significant of chronic respiratory failure with hypoxia on supplemental oxygen via [place] at 4 LPM, COPD Gold stage IV, high-grade serous ovarian cancer, hypertension, hyperlipidemia, GERD admitted on 03/07/2023 with acute COPD exacerbation -After being discharged on 03/06/2023.  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 Ellin Saba Continue niraparib   5)Iron deficiency anemia Continue ferrous sulfate  6)HTN--- continue amlodipine  -Total care time 56 minutes - Patient seen and evaluated, chart reviewed, please see EMR for updated orders. Please see full H&P dictated by admitting physician Dr Thomes Dinning for same date of service.   Shon Hale, MD

## 2023-03-08 DIAGNOSIS — Z7982 Long term (current) use of aspirin: Secondary | ICD-10-CM | POA: Diagnosis not present

## 2023-03-08 DIAGNOSIS — Z825 Family history of asthma and other chronic lower respiratory diseases: Secondary | ICD-10-CM | POA: Diagnosis not present

## 2023-03-08 DIAGNOSIS — Z7951 Long term (current) use of inhaled steroids: Secondary | ICD-10-CM | POA: Diagnosis not present

## 2023-03-08 DIAGNOSIS — J441 Chronic obstructive pulmonary disease with (acute) exacerbation: Secondary | ICD-10-CM | POA: Diagnosis present

## 2023-03-08 DIAGNOSIS — K219 Gastro-esophageal reflux disease without esophagitis: Secondary | ICD-10-CM | POA: Diagnosis present

## 2023-03-08 DIAGNOSIS — T380X5A Adverse effect of glucocorticoids and synthetic analogues, initial encounter: Secondary | ICD-10-CM | POA: Diagnosis present

## 2023-03-08 DIAGNOSIS — Z79899 Other long term (current) drug therapy: Secondary | ICD-10-CM | POA: Diagnosis not present

## 2023-03-08 DIAGNOSIS — J9621 Acute and chronic respiratory failure with hypoxia: Secondary | ICD-10-CM | POA: Diagnosis present

## 2023-03-08 DIAGNOSIS — Z9981 Dependence on supplemental oxygen: Secondary | ICD-10-CM | POA: Diagnosis not present

## 2023-03-08 DIAGNOSIS — C569 Malignant neoplasm of unspecified ovary: Secondary | ICD-10-CM | POA: Diagnosis present

## 2023-03-08 DIAGNOSIS — D509 Iron deficiency anemia, unspecified: Secondary | ICD-10-CM | POA: Diagnosis present

## 2023-03-08 DIAGNOSIS — E871 Hypo-osmolality and hyponatremia: Secondary | ICD-10-CM | POA: Diagnosis present

## 2023-03-08 DIAGNOSIS — A310 Pulmonary mycobacterial infection: Secondary | ICD-10-CM | POA: Diagnosis present

## 2023-03-08 DIAGNOSIS — E782 Mixed hyperlipidemia: Secondary | ICD-10-CM | POA: Diagnosis present

## 2023-03-08 DIAGNOSIS — C786 Secondary malignant neoplasm of retroperitoneum and peritoneum: Secondary | ICD-10-CM | POA: Diagnosis present

## 2023-03-08 DIAGNOSIS — I5033 Acute on chronic diastolic (congestive) heart failure: Secondary | ICD-10-CM | POA: Diagnosis present

## 2023-03-08 DIAGNOSIS — Z87891 Personal history of nicotine dependence: Secondary | ICD-10-CM | POA: Diagnosis not present

## 2023-03-08 DIAGNOSIS — I700 Atherosclerosis of aorta: Secondary | ICD-10-CM | POA: Diagnosis present

## 2023-03-08 DIAGNOSIS — I110 Hypertensive heart disease with heart failure: Secondary | ICD-10-CM | POA: Diagnosis present

## 2023-03-08 DIAGNOSIS — J439 Emphysema, unspecified: Secondary | ICD-10-CM | POA: Diagnosis present

## 2023-03-08 DIAGNOSIS — Z8249 Family history of ischemic heart disease and other diseases of the circulatory system: Secondary | ICD-10-CM | POA: Diagnosis not present

## 2023-03-08 DIAGNOSIS — I739 Peripheral vascular disease, unspecified: Secondary | ICD-10-CM | POA: Diagnosis present

## 2023-03-08 LAB — COMPREHENSIVE METABOLIC PANEL
ALT: 14 U/L (ref 0–44)
AST: 18 U/L (ref 15–41)
Albumin: 3.1 g/dL — ABNORMAL LOW (ref 3.5–5.0)
Alkaline Phosphatase: 72 U/L (ref 38–126)
Anion gap: 13 (ref 5–15)
BUN: 39 mg/dL — ABNORMAL HIGH (ref 8–23)
CO2: 34 mmol/L — ABNORMAL HIGH (ref 22–32)
Calcium: 8.7 mg/dL — ABNORMAL LOW (ref 8.9–10.3)
Chloride: 80 mmol/L — ABNORMAL LOW (ref 98–111)
Creatinine, Ser: 0.63 mg/dL (ref 0.44–1.00)
GFR, Estimated: >60 mL/min (ref >60)
Glucose, Bld: 119 mg/dL — ABNORMAL HIGH (ref 70–99)
Potassium: 4.2 mmol/L (ref 3.5–5.1)
Sodium: 127 mmol/L — ABNORMAL LOW (ref 135–145)
Total Bilirubin: 0.8 mg/dL (ref 0.3–1.2)
Total Protein: 6.6 g/dL (ref 6.5–8.1)

## 2023-03-08 LAB — CBC
HCT: 31.3 % — ABNORMAL LOW (ref 36.0–46.0)
Hemoglobin: 10.2 g/dL — ABNORMAL LOW (ref 12.0–15.0)
MCH: 28.1 pg (ref 26.0–34.0)
MCHC: 32.6 g/dL (ref 30.0–36.0)
MCV: 86.2 fL (ref 80.0–100.0)
Platelets: 181 10*3/uL (ref 150–400)
RBC: 3.63 MIL/uL — ABNORMAL LOW (ref 3.87–5.11)
RDW: 16.8 % — ABNORMAL HIGH (ref 11.5–15.5)
WBC: 16.7 10*3/uL — ABNORMAL HIGH (ref 4.0–10.5)
nRBC: 0.3 % — ABNORMAL HIGH (ref 0.0–0.2)

## 2023-03-08 LAB — MAGNESIUM: Magnesium: 1.7 mg/dL (ref 1.7–2.4)

## 2023-03-08 MED ORDER — MELATONIN 3 MG PO TABS
6.0000 mg | ORAL_TABLET | Freq: Once | ORAL | Status: AC
  Administered 2023-03-08: 6 mg via ORAL
  Filled 2023-03-08: qty 2

## 2023-03-08 NOTE — Progress Notes (Signed)
Patient complained during the night of left flank pain. MD is aware. Patient given prn medication x2. Patient slept off and on. Patient continues to be on 4L of oxygen via nasal cannula.  Plan of care ongoing.

## 2023-03-08 NOTE — Progress Notes (Signed)
RT discussed wearing a CPAP at bedtime with patient and she stated she could not war a mask or stand anything over her face. Patient is on 3-4Lpm [place] and is resting well at this time. RT made patient aware its use and and a unit will be on standby if hr condition changes.

## 2023-03-08 NOTE — Progress Notes (Signed)
PROGRESS NOTE     [name], is a 67 y.o. female, DOB - September 13, 1956, ZOX:[id number]  Admit date - 03/06/2023   Admitting Physician Chanel Mckesson Mariea Clonts, MD  Outpatient Primary MD for the patient is Adamo, Jeris Penta, MD  LOS - 0  Chief Complaint  Patient presents with   Shortness of Breath        Brief Summary:-  67 y.o. female with medical history significant of chronic respiratory failure with hypoxia on supplemental oxygen via [place] at 4 LPM, COPD Gold stage IV, high-grade serous ovarian cancer, hypertension, hyperlipidemia, GERD admitted on 03/07/2023 with acute COPD exacerbation -After being discharged on 03/06/2023.   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 Ellin Saba Continue niraparib   5)Iron deficiency anemia Continue ferrous sulfate   6)HTN--- continue amlodipine  7) acute on chronic hypoxic respiratory failure--- secondary to #1 and #2 above At baseline Patient uses 4 L of oxygen via nasal cannula -Currently requiring up to 6 L   Status is: Inpatient   Disposition: The patient is from: Home               Anticipated d/c is to: Home              Anticipated d/c date is: 1 day              Patient currently is not medically stable to d/c. Barriers: Not Clinically Stable-   Code Status :  -  Code Status: Full Code   Family Communication:    NA (patient is alert, awake and coherent)   DVT Prophylaxis  :   - SCDs  enoxaparin (LOVENOX) injection 40 mg Start: 03/07/23 1000 SCDs Start: 03/07/23 0615   Lab Results  Component Value Date   PLT 181 03/08/2023    Inpatient Medications  Scheduled Meds:  amLODipine  5 mg Oral QHS   atorvastatin  40 mg Oral Daily   dextromethorphan-guaiFENesin  1 tablet Oral BID   enoxaparin (LOVENOX) injection  40 mg Subcutaneous Q24H   ferrous sulfate  325 mg Oral QODAY   furosemide  20 mg Intravenous Daily   ipratropium-albuterol  3 mL Nebulization Q6H   methylPREDNISolone (SOLU-MEDROL) injection  40 mg Intravenous Q12H   pantoprazole  40 mg Oral Daily   Continuous Infusions:  azithromycin 500 mg (03/08/23 0945)   PRN Meds:.acetaminophen **OR** acetaminophen, HYDROcodone-acetaminophen, ipratropium-albuterol, ondansetron **OR** ondansetron (ZOFRAN) IV   Anti-infectives (From admission, onward)    Start     Dose/Rate Route Frequency Ordered Stop   03/08/23 1000  azithromycin (ZITHROMAX) tablet 250 mg  Status:  Discontinued       See Hyperspace for full Linked Orders Report.   250 mg Oral Daily 03/07/23 0602 03/07/23 0801   03/07/23 1000  azithromycin (ZITHROMAX) tablet 500 mg  Status:  Discontinued       See Hyperspace for full Linked Orders Report.   500 mg Oral Daily 03/07/23 0602 03/07/23 0801   03/07/23 1000  azithromycin (ZITHROMAX) 500 mg in sodium chloride 0.9 % 250 mL IVPB        500 mg 250 mL/hr over 60 Minutes Intravenous Every 24 hours 03/07/23 0801 03/12/23 0959         Subjective: [name] today has no fevers, no emesis,  No chest pain,    -Cough hypoxia wheezing and dyspnea persist At baseline Patient uses 4 L of oxygen via  nasal cannula -Currently requiring up to 6 L -Complains of right flank pain due to cough   Objective: Vitals:   03/08/23 1332 03/08/23 1404 03/08/23 1925 03/08/23 2000  BP: 129/66   (!) 129/95  Pulse: 85   (!) 104  Resp: 16   18  Temp: 98.3 F (36.8 C)   98.3 F (36.8 C)  TempSrc: Oral     SpO2: 95% (!) 89% 95% 97%  Weight:      Height:        Intake/Output Summary (Last 24 hours) at 03/08/2023 2046 Last data filed at 03/08/2023 1700 Gross per 24 hour  Intake 720 ml  Output 750 ml  Net -30 ml   Filed Weights   03/06/23 2358 03/08/23 0410  Weight: 52.5 kg 50.6 kg    Physical Exam  Gen:- Awake Alert, dyspnea on exertion HEENT:- [place].AT, No sclera icterus Nose-[place] 6L/min Neck-Supple Neck,No JVD,.  Lungs-diminished sounds with scattered wheezes bilaterally CV- S1, S2 normal, regular  Abd-  +ve B.Sounds, Abd Soft, No tenderness,    Extremity/Skin:- No  edema, pedal pulses present  Psych-affect is appropriate, oriented x3 Neuro-no new focal deficits, no tremors  Data Reviewed: I have personally reviewed following labs and imaging studies  CBC: Recent Labs  Lab 03/03/23 1601 03/04/23 0420 03/06/23 0405 03/07/23 0237 03/08/23 0358  WBC 10.1 5.5 6.8 13.3* 16.7*  NEUTROABS  --   --   --  10.7*  --   HGB 10.4* 8.7* 10.4* 10.3* 10.2*  HCT 33.5* 27.5* 33.3* 31.8* 31.3*  MCV 90.3 89.6 90.2 86.6 86.2  PLT 219 175 208 216 181   Basic Metabolic Panel: Recent Labs  Lab 03/03/23 1601 03/04/23 0420 03/06/23 0405 03/07/23 0237 03/07/23 0617 03/08/23 0358  NA 133* 133* 132* 128*  --  127*  K 3.9 4.1 3.7 3.9  --  4.2  CL 92* 96* 90* 83*  --  80*  CO2 32 32 32 33*  --  34*  GLUCOSE 123* 117* 101* 138*  --  119*  BUN 10 12 15 16   --  39*  CREATININE 0.60 0.54 0.54 0.58  --  0.63  CALCIUM 8.6* 7.6* 8.4* 8.5*  --  8.7*  MG  --   --  1.8  --   --  1.7  PHOS  --   --   --   --  3.1  --    GFR: Estimated Creatinine Clearance: 51 mL/min (by C-G formula based on SCr of  0.63 mg/dL). Liver Function Tests: Recent Labs  Lab 03/07/23 0237 03/08/23 0358  AST 22 18  ALT 16 14  ALKPHOS 76 72  BILITOT 0.3 0.8  PROT 6.7 6.6  ALBUMIN 3.4* 3.1*   Recent Results (from the past 240 hour(s))  MRSA Next Gen by PCR, Nasal     Status: None   Collection Time: 03/03/23  5:56 PM   Specimen: Nasal Mucosa; Nasal Swab  Result Value Ref Range Status   MRSA by PCR Next Gen NOT DETECTED NOT DETECTED Final    Comment: (NOTE) The GeneXpert MRSA Assay (FDA approved for NASAL specimens only), is one component of a comprehensive MRSA colonization surveillance program. It is not intended to diagnose MRSA infection nor to guide or monitor treatment for MRSA infections. Test performance is not FDA approved in patients less than 79 years old. Performed at [facility], [address], [place], [place] [address]     Radiology Studies: ECHOCARDIOGRAM COMPLETE  Result Date: 03/07/2023    ECHOCARDIOGRAM REPORT   Patient Name:   JARETZY LHOMMEDIEU Date of Exam: 03/07/2023 Medical Rec #:  [id number]     Height:       60.0 in Accession #:    [id number]    Weight:       115.7 lb Date of Birth:  1956-01-18     BSA:          1.479 m Patient Age:    66 years      BP:           143/115 mmHg Patient Gender: F             HR:           92 bpm. [facility] Procedure: 2D Echo, Cardiac Doppler and Color Doppler Indications:    CHF-Acute Diastolic I50.31  History:        Patient has prior history of Echocardiogram examinations, most                 recent 12/18/2020. Cancer; Risk Factors:Hypertension and                 Dyslipidemia.  Sonographer:    Aron Baba Referring Phys: [id number] OLADAPO ADEFESO  Sonographer Comments: Suboptimal parasternal window. Image acquisition challenging due to patient body habitus, Image acquisition challenging due to COPD and Image acquisition challenging due to respiratory motion. IMPRESSIONS  1. Left ventricular ejection fraction, by estimation, is 55 to 60%. The  left ventricle has normal function. The left ventricle has no regional wall motion abnormalities. Left ventricular diastolic parameters are consistent with Grade I diastolic dysfunction (impaired relaxation).  2. Right ventricular systolic function is normal. The right ventricular size is normal.  3. The mitral valve is normal in structure. No evidence of mitral valve regurgitation. No evidence of mitral stenosis.  4. The aortic valve is tricuspid. There is mild calcification of the aortic valve. There is mild thickening of the aortic valve. Aortic valve regurgitation is not visualized. No aortic stenosis is present.  5. The inferior vena cava is normal in size with greater than 50% respiratory variability, suggesting right atrial pressure of 3 mmHg. FINDINGS  Left Ventricle: Left ventricular ejection fraction, by estimation, is 55 to 60%. The left ventricle has normal function. The left ventricle has no regional wall motion abnormalities. The left ventricular internal cavity size was normal in size. There is  no left ventricular hypertrophy. Left ventricular diastolic parameters are consistent with Grade I diastolic dysfunction (impaired relaxation). Normal left ventricular filling pressure. Right Ventricle: The right ventricular size is normal. Right vetricular wall thickness was not well visualized. Right ventricular systolic function is normal. Left Atrium: Left atrial size was normal in size. Right Atrium: Right atrial size was normal in size. Pericardium: There is no evidence of pericardial effusion. Mitral Valve: The mitral valve is  normal in structure. There is mild thickening of the mitral valve leaflet(s). There is mild calcification of the mitral valve leaflet(s). Mild mitral annular calcification. No evidence of mitral valve regurgitation. No evidence of mitral valve stenosis. Tricuspid Valve: The tricuspid valve is normal in structure. Tricuspid valve regurgitation is not demonstrated. No evidence of  tricuspid stenosis. Aortic Valve: The aortic valve is tricuspid. There is mild calcification of the aortic valve. There is mild thickening of the aortic valve. There is mild aortic valve annular calcification. Aortic valve regurgitation is not visualized. No aortic stenosis  is present. Aortic valve mean gradient measures 2.6 mmHg. Aortic valve peak gradient measures 5.3 mmHg. Aortic valve area, by VTI measures 3.06 cm. Pulmonic Valve: The pulmonic valve was not well visualized. Pulmonic valve regurgitation is not visualized. No evidence of pulmonic stenosis. Aorta: The aortic root is normal in size and structure. Venous: The inferior vena cava is normal in size with greater than 50% respiratory variability, suggesting right atrial pressure of 3 mmHg. IAS/Shunts: No atrial level shunt detected by color flow Doppler.  LEFT VENTRICLE PLAX 2D LVIDd:         4.30 cm   Diastology LVIDs:         2.70 cm   LV e' medial:    5.00 cm/s LV PW:         1.10 cm   LV E/e' medial:  16.0 LV IVS:        1.00 cm   LV e' lateral:   11.00 cm/s LVOT diam:     2.00 cm   LV E/e' lateral: 7.3 LV SV:         52 LV SV Index:   35 LVOT Area:     3.14 cm  RIGHT VENTRICLE RV S prime:     16.20 cm/s TAPSE (M-mode): 1.5 cm LEFT ATRIUM             Index        RIGHT ATRIUM           Index LA diam:        2.50 cm 1.69 cm/m   RA Area:     10.90 cm LA Vol (A2C):   33.2 ml 22.44 ml/m  RA Volume:   19.80 ml  13.38 ml/m LA Vol (A4C):   32.2 ml 21.76 ml/m LA Biplane Vol: 33.0 ml 22.31 ml/m  AORTIC VALVE AV Area (Vmax):    2.69 cm AV Area (Vmean):   2.77 cm AV Area (VTI):     3.06 cm AV Vmax:           115.11 cm/s AV Vmean:          73.766 cm/s AV VTI:            0.169 m AV Peak Grad:      5.3 mmHg AV Mean Grad:      2.6 mmHg LVOT Vmax:         98.50 cm/s LVOT Vmean:        65.100 cm/s LVOT VTI:          0.165 m LVOT/AV VTI ratio: 0.97  AORTA Ao Root diam: 3.80 cm MITRAL VALVE MV Area (PHT): 4.71 cm     SHUNTS MV Decel Time: 161 msec      Systemic VTI:  0.16 m MV E velocity: 79.90 cm/s   Systemic Diam: 2.00 cm MV A velocity: 133.00 cm/s MV E/A ratio:  0.60 Dina Rich MD Electronically signed  by Dina Rich MD Signature Date/Time: 03/07/2023/2:15:30 PM    Final    CT Angio Chest PE W and/or Wo Contrast  Result Date: 03/07/2023 CLINICAL DATA:  67 year old female history of worsening shortness of breath. EXAM: CT ANGIOGRAPHY CHEST WITH CONTRAST TECHNIQUE: Multidetector CT imaging of the chest was performed using the standard protocol during bolus administration of intravenous contrast. Multiplanar CT image reconstructions and MIPs were obtained to evaluate the vascular anatomy. RADIATION DOSE REDUCTION: This exam was performed according to the departmental dose-optimization program which includes automated exposure control, adjustment of the mA and/or kV according to patient size and/or use of iterative reconstruction technique. CONTRAST:  OMNIPAQUE IOHEXOL 350 MG/ML SOLN COMPARISON:  Chest CTA 11/15/2022. FINDINGS: Cardiovascular: No filling defects within the pulmonary arterial tree to suggest pulmonary embolism. Heart size is normal. There is no significant pericardial fluid, thickening or pericardial calcification. There is aortic atherosclerosis, as well as atherosclerosis of the great vessels of the mediastinum and the coronary arteries, including calcified atherosclerotic plaque in the left main, left anterior descending, left circumflex and right coronary arteries. Mediastinum/Nodes: No pathologically enlarged mediastinal or hilar lymph nodes. Esophagus is unremarkable in appearance. No axillary lymphadenopathy. Lungs/Pleura: Diffuse bronchial wall thickening with thickening of the peribronchovascular interstitium, scattered areas of cylindrical bronchiectasis, peripheral bronchiolectasis, and extensive widespread peribronchovascular micro and macronodularity, most compatible with widespread areas of mucoid impaction within  terminal bronchioles in the setting of chronic atypical infection. No larger more suspicious appearing pulmonary nodules or masses are noted. No confluent consolidative airspace disease. No pleural effusions. There is also a background of moderate centrilobular and paraseptal emphysema. Upper Abdomen: Aortic atherosclerosis. Musculoskeletal: There are no aggressive appearing lytic or blastic lesions noted in the visualized portions of the skeleton. Review of the MIP images confirms the above findings. IMPRESSION: 1. No evidence of pulmonary embolism. 2. The appearance of the chest is compatible with chronic atypical infection, such as mycobacterium avium intracellulare (MAI). Outpatient referral to Pulmonology for further clinical evaluation is recommended. 3. Moderate centrilobular and paraseptal emphysema; imaging findings suggestive of underlying COPD. 4. Aortic atherosclerosis, in addition to left main and three-vessel coronary artery disease. Please note that although the presence of coronary artery calcium documents the presence of coronary artery disease, the severity of this disease and any potential stenosis cannot be assessed on this non-gated CT examination. Assessment for potential risk factor modification, dietary therapy or pharmacologic therapy may be warranted, if clinically indicated. Aortic Atherosclerosis (ICD10-I70.0) and Emphysema (ICD10-J43.9). Electronically Signed   By: Trudie Reed M.D.   On: 03/07/2023 06:13   DG Chest Portable 1 View  Result Date: 03/07/2023 CLINICAL DATA:  Shortness of breath. EXAM: PORTABLE CHEST 1 VIEW COMPARISON:  Portable chest 03/03/2023 FINDINGS: The lungs are emphysematous with biapical scarring change, additional linear scarring or atelectasis in the lateral base. No focal pneumonia is evident. Overall aeration seems unchanged. There is no pleural effusion. Mediastinum is stable. The aortic arch is heavily calcified. Heart size and vascular pattern are  normal. Osteopenia. No acute osseous findings or changes. Healed fracture deformity of posterolateral left 6th rib. Left chest subclavian approach port catheter again terminates at the superior cavoatrial junction. IMPRESSION: No acute chest findings. COPD with biapical scarring change. Aortic atherosclerosis. Electronically Signed   By: Almira Bar M.D.   On: 03/07/2023 01:38    Scheduled Meds:  amLODipine  5 mg Oral QHS   atorvastatin  40 mg Oral Daily   dextromethorphan-guaiFENesin  1 tablet Oral BID  enoxaparin (LOVENOX) injection  40 mg Subcutaneous Q24H   ferrous sulfate  325 mg Oral QODAY   furosemide  20 mg Intravenous Daily   ipratropium-albuterol  3 mL Nebulization Q6H   methylPREDNISolone (SOLU-MEDROL) injection  40 mg Intravenous Q12H   pantoprazole  40 mg Oral Daily   Continuous Infusions:  azithromycin 500 mg (03/08/23 0945)    LOS: 0 days   Shon Hale M.D on 03/08/2023 at 8:46 PM  Go to [email or web address] - for contact info  [facility] - Office  [phone]  If 7PM-7AM, please contact night-coverage [email or web address] 03/08/2023, 8:46 PM

## 2023-03-08 NOTE — TOC Initial Note (Signed)
Transition of Care [facility]) - Initial/Assessment Note    Patient Details  Name: [name] MRN: [id number] Date of Birth: 1956/01/15  [facility]) CM/SW Contact:    Catalina Gravel, LCSW Phone Number: 03/08/2023, 5:03 PM  Clinical Narrative:                  Pt not ready for DC but likely will need 02.  CSW to follow up with MD and pt tomorrow regarding DC plan and home 02 if needed. T   Barriers to Discharge: Continued Medical Work up   Patient Goals and CMS Choice            Expected Discharge Plan and Services                                              Prior Living Arrangements/Services                       Activities of Daily Living Home Assistive Devices/Equipment: Environmental consultant (specify type) ADL Screening (condition at time of admission) Patient's cognitive ability adequate to safely complete daily activities?: Yes Is the patient deaf or have difficulty hearing?: No Does the patient have difficulty seeing, even when wearing glasses/contacts?: No Does the patient have difficulty concentrating, remembering, or making decisions?: No Patient able to express need for assistance with ADLs?: Yes Does the patient have difficulty dressing or bathing?: No Independently performs ADLs?: Yes (appropriate for developmental age) Does the patient have difficulty walking or climbing stairs?: Yes Weakness of Legs: Both Weakness of Arms/Hands: None  Permission Sought/Granted                  Emotional Assessment              Admission diagnosis:  Acute exacerbation of chronic obstructive pulmonary disease (COPD) [J44.1] Dyspnea, unspecified type [R06.00] COPD with acute exacerbation [J44.1] Patient Active Problem List   Diagnosis Date Noted   COPD with acute exacerbation 03/08/2023   Acute exacerbation of chronic obstructive pulmonary disease (COPD) 03/07/2023   Elevated brain natriuretic peptide (BNP) level 03/07/2023    Leukocytosis 03/07/2023   COPD (chronic obstructive pulmonary disease) 03/04/2023   Sepsis due to pneumonia 10/29/2022   Dyslipidemia 10/29/2022   Essential tremor 10/29/2022   GERD without esophagitis 10/29/2022   Peripheral neuropathy 10/29/2022   RSV (respiratory syncytial virus pneumonia) 10/29/2022   Symptomatic anemia 10/03/2022   Macrocytic anemia 10/03/2022   Pancytopenia 10/03/2022   Thrush 09/11/2022   Odynophagia 08/26/2022   COPD exacerbation 07/31/2022   GERD (gastroesophageal reflux disease) 07/15/2022   ABLA (acute blood loss anemia)    Esophagitis 06/28/2022   Hematochezia 05/03/2022   E coli bacteremia 03/11/2022   Hypokalemia 03/11/2022   Hyponatremia 03/10/2022   Thrombocytopenia 03/10/2022   Acute on chronic respiratory failure with hypoxia and hypercapnia 12/30/2021   Deficiency anemia 05/23/2021   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   Sepsis due to undetermined organism 12/18/2020   Lobar pneumonia 12/18/2020   Syncope 12/17/2020   Genetic testing 08/01/2020   Ovarian cancer 07/07/2020   Port-A-Cath in place 07/03/2020   Family history of prostate cancer 07/03/2020   Goals of care, counseling/discussion 07/01/2020   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   Pneumonia 09/13/2018   Acute on chronic respiratory failure 01/22/2018   CAP (community acquired pneumonia)  01/18/2016   Iron deficiency anemia 11/07/2015   Chronic respiratory failure with hypoxia 11/07/2015   Acute on chronic respiratory failure with hypoxia 11/07/2015   Mixed hyperlipidemia 11/07/2015   COPD GOLD 4/ bronchiectasis/02 dep 11/06/2015   Essential hypertension 11/06/2015   PCP:  Abram Sander, MD Pharmacy:   [facility] [address], [place] - [address] [place] [address] Phone: [phone] Fax: [phone]  [facility] [address] [place] [address] Phone: [phone] Fax:  [phone]     Social Determinants of Health (SDOH) Social History: SDOH Screenings   Food Insecurity: No Food Insecurity (03/07/2023)  Housing: Medium Risk (03/07/2023)  Transportation Needs: Unmet Transportation Needs (03/07/2023)  Utilities: Not At Risk (03/07/2023)  Alcohol Screen: Low Risk  (09/27/2020)  Depression (PHQ2-9): Low Risk  (09/27/2020)  Financial Resource Strain: Low Risk  (09/27/2020)  Physical Activity: Inactive (09/27/2020)  Social Connections: Socially Isolated (09/27/2020)  Stress: No Stress Concern Present (09/27/2020)  Tobacco Use: Medium Risk (03/07/2023)   SDOH Interventions: Housing Interventions: Inpatient TOC Transportation Interventions: Inpatient TOC   Readmission Risk Interventions    03/05/2023    3:44 PM 01/03/2023   12:44 PM 10/30/2022   10:52 AM  Readmission Risk Prevention Plan  Transportation Screening Complete Complete Complete  Medication Review Oceanographer) Complete Complete Complete  PCP or Specialist appointment within 3-5 days of discharge Not Complete    HRI or Home Care Consult Complete Complete Complete  SW Recovery Care/Counseling Consult Complete Complete Complete  Palliative Care Screening Complete Not Applicable Not Applicable  Skilled Nursing Facility Not Applicable Not Applicable Not Applicable

## 2023-03-09 MED ORDER — PANTOPRAZOLE SODIUM 40 MG PO TBEC
40.0000 mg | DELAYED_RELEASE_TABLET | Freq: Once | ORAL | Status: AC
  Administered 2023-03-09: 40 mg via ORAL
  Filled 2023-03-09: qty 1

## 2023-03-09 NOTE — Progress Notes (Signed)
PROGRESS NOTE     [name], is a 67 y.o. female, DOB - 1956/05/28, ZDG:[id number]  Admit date - 03/06/2023   Admitting Physician Arshiya Jakes Mariea Clonts, MD  Outpatient Primary MD for the patient is Adamo, Jeris Penta, MD  LOS - 1  Chief Complaint  Patient presents with   Shortness of Breath        Brief Summary:-  67 y.o. female with medical history significant of chronic respiratory failure with hypoxia on supplemental oxygen via [place] at 4 LPM, COPD Gold stage IV, high-grade serous ovarian cancer, hypertension, hyperlipidemia, GERD admitted on 03/07/2023 with acute COPD exacerbation -After being discharged on 03/06/2023.   A/p 1) acute COPD exacerbation---  - continue IV Solu-Medrol, mucolytics bronchodilators and  azithromycin -ABG reflects chronic hypoxemia on chronic hypercapnia 03/09/23 -Patient reluctant to get out of bed due to dyspnea -Cough persist -Hypoxia is not improving -Consider repeat chest x-ray on 03/10/2023   2)HFpEF/acute on chronic diastolic dysfunction CHF exacerbation -Increase shortness of breath and elevated BNP noted -Echo with EF of 55 to 60%, grade 1 diastolic dysfunction no mitral stenosis, no aortic stenosis and no wall motion abnormalities -IV Lasix, daily weights and fluid input and output monitoring as ordered   3)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 Ellin Saba Continue niraparib   5)Iron deficiency anemia -Hgb stable above 10 Continue ferrous sulfate   6)HTN--- continue amlodipine  7) acute on chronic hypoxic respiratory failure--- secondary to #1 and #2 above At baseline Patient uses 4 L of oxygen via nasal  cannula -Currently requiring up to 6 L -Consider repeat chest x-ray on 03/10/2023 given persistent respiratory symptoms  8) persistent leukocytosis most likely due to steroids for COPD Consider  repeat chest x-ray on 03/10/2023  Status is: Inpatient   Disposition: The patient is from: Home              Anticipated d/c is to: Home              Anticipated d/c date is: 1 day              Patient currently is not medically stable to d/c. Barriers: Not Clinically Stable-   Code Status :  -  Code Status: Full Code   Family Communication:    NA (patient is alert, awake and coherent)   DVT Prophylaxis  :   - SCDs  enoxaparin (LOVENOX) injection 40 mg Start: 03/07/23 1000 SCDs Start: 03/07/23 0615   Lab Results  Component Value Date   PLT 181 03/08/2023    Inpatient Medications  Scheduled Meds:  amLODipine  5 mg Oral QHS   atorvastatin  40 mg Oral Daily   dextromethorphan-guaiFENesin  1 tablet Oral BID   enoxaparin (LOVENOX) injection  40 mg Subcutaneous Q24H   ferrous sulfate  325 mg Oral QODAY   furosemide  20 mg Intravenous Daily   ipratropium-albuterol  3 mL Nebulization Q6H   methylPREDNISolone (SOLU-MEDROL) injection  40 mg Intravenous Q12H   pantoprazole  40 mg Oral Daily   Continuous Infusions:  azithromycin 500 mg (03/09/23 0830)   PRN Meds:.acetaminophen **OR** acetaminophen, HYDROcodone-acetaminophen, ipratropium-albuterol, ondansetron **OR** ondansetron (ZOFRAN) IV   Anti-infectives (From admission, onward)    Start     Dose/Rate Route Frequency Ordered Stop  03/08/23 1000  azithromycin (ZITHROMAX) tablet 250 mg  Status:  Discontinued       See Hyperspace for full Linked Orders Report.   250 mg Oral Daily 03/07/23 0602 03/07/23 0801   03/07/23 1000  azithromycin (ZITHROMAX) tablet 500 mg  Status:  Discontinued       See Hyperspace for full Linked Orders Report.   500 mg Oral Daily 03/07/23 0602 03/07/23 0801   03/07/23 1000  azithromycin (ZITHROMAX) 500 mg  in sodium chloride 0.9 % 250 mL IVPB        500 mg 250 mL/hr over 60 Minutes Intravenous Every 24 hours 03/07/23 0801 03/12/23 0959       Subjective: [name] today has no fevers, no emesis,  No chest pain,    -Cough ,hypoxia wheezing and dyspnea persist At baseline Patient uses 4 L of oxygen via nasal cannula -Currently requiring up to 6 L -Complains of rib cage discomfort due to cough -Patient reluctant to get out of bed due to dyspnea   Objective: Vitals:   03/09/23 0810 03/09/23 0821 03/09/23 1417 03/09/23 1445  BP: (!) 143/83   133/76  Pulse: 99   (!) 103  Resp: 20   16  Temp: 98.2 F (36.8 C)   98.6 F (37 C)  TempSrc: Oral   Oral  SpO2: 93% 94% 94% 95%  Weight:      Height:        Intake/Output Summary (Last 24 hours) at 03/09/2023 1751 Last data filed at 03/09/2023 1700 Gross per 24 hour  Intake 480 ml  Output 200 ml  Net 280 ml   Filed Weights   03/06/23 2358 03/08/23 0410 03/09/23 0400  Weight: 52.5 kg 50.6 kg 51.8 kg   Physical Exam  Gen:- Awake Alert, dyspnea on exertion HEENT:- [place].AT, No sclera icterus Nose-[place] 6L/min Neck-Supple Neck,No JVD,.  Lungs-diminished sounds with scattered wheezes bilaterally CV- S1, S2 normal, regular  Abd-  +ve B.Sounds, Abd Soft, No tenderness,    Extremity/Skin:- No  edema, pedal pulses present  Psych-affect is appropriate, oriented x3 Neuro-no new focal deficits, no tremors  Data Reviewed: I have personally reviewed following labs and imaging studies  CBC: Recent Labs  Lab 03/03/23 1601 03/04/23 0420 03/06/23 0405 03/07/23 0237 03/08/23 0358  WBC 10.1 5.5 6.8 13.3* 16.7*  NEUTROABS  --   --   --  10.7*  --   HGB 10.4* 8.7* 10.4* 10.3* 10.2*  HCT 33.5* 27.5* 33.3* 31.8* 31.3*  MCV 90.3 89.6 90.2 86.6 86.2  PLT 219 175 208 216 181   Basic Metabolic Panel: Recent Labs  Lab 03/03/23 1601 03/04/23 0420 03/06/23 0405 03/07/23 0237 03/07/23 0617 03/08/23 0358  NA 133* 133* 132* 128*  --  127*  K  3.9 4.1 3.7 3.9  --  4.2  CL 92* 96* 90* 83*  --  80*  CO2 32 32 32 33*  --  34*  GLUCOSE 123* 117* 101* 138*  --  119*  BUN --  39*  CREATININE 0.60 0.54 0.54 0.58  --  0.63  CALCIUM 8.6* 7.6* 8.4* 8.5*  --  8.7*  MG  --   --  1.8  --   --  1.7  PHOS  --   --   --   --  3.1  --    GFR: Estimated Creatinine Clearance: 51 mL/min (by C-G formula based on SCr of 0.63 mg/dL). Liver Function Tests: Recent Labs  Lab  03/07/23 0237 03/08/23 0358  AST 22 18  ALT 16 14  ALKPHOS 76 72  BILITOT 0.3 0.8  PROT 6.7 6.6  ALBUMIN 3.4* 3.1*   Recent Results (from the past 240 hour(s))  MRSA Next Gen by PCR, Nasal     Status: None   Collection Time: 03/03/23  5:56 PM   Specimen: Nasal Mucosa; Nasal Swab  Result Value Ref Range Status   MRSA by PCR Next Gen NOT DETECTED NOT DETECTED Final    Comment: (NOTE) The GeneXpert MRSA Assay (FDA approved for NASAL specimens only), is one component of a comprehensive MRSA colonization surveillance program. It is not intended to diagnose MRSA infection nor to guide or monitor treatment for MRSA infections. Test performance is not FDA approved in patients less than 19 years old. Performed at [facility], [address] [place], [place] [address]     Scheduled Meds:  amLODipine  5 mg Oral QHS   atorvastatin  40 mg Oral Daily   dextromethorphan-guaiFENesin  1 tablet Oral BID   enoxaparin (LOVENOX) injection  40 mg Subcutaneous Q24H   ferrous sulfate  325 mg Oral QODAY   furosemide  20 mg Intravenous Daily   ipratropium-albuterol  3 mL Nebulization Q6H   methylPREDNISolone (SOLU-MEDROL) injection  40 mg Intravenous Q12H   pantoprazole  40 mg Oral Daily   Continuous Infusions:  azithromycin 500 mg (03/09/23 0830)    LOS: 1 day   Shon Hale M.D on 03/09/2023 at 5:51 PM  Go to [email or web address] - for contact info  [facility] - Office  [phone]  If 7PM-7AM, please contact night-coverage [email or web address] 03/09/2023, 5:51  PM

## 2023-03-09 NOTE — TOC Progression Note (Signed)
Transition of Care [facility]) - Progression Note    Patient Details  Name: [name] MRN: [id number] Date of Birth: [birth year]  [facility]) CM/SW Contact  Catalina Gravel, LCSW Phone Number: 03/09/2023, 4:04 PM  Clinical Narrative:     Pt has home 02 and may need to increase at DC. DC Monday. TOC to follow , watch 02 needs.    Barriers to Discharge: Continued Medical Work up  Expected Discharge Plan and Services                                               Social Determinants of Health (SDOH) Interventions SDOH Screenings   Food Insecurity: No Food Insecurity (03/07/2023)  Housing: Medium Risk (03/07/2023)  Transportation Needs: Unmet Transportation Needs (03/07/2023)  Utilities: Not At Risk (03/07/2023)  Alcohol Screen: Low Risk  (09/27/2020)  Depression (PHQ2-9): Low Risk  (09/27/2020)  Financial Resource Strain: Low Risk  (09/27/2020)  Physical Activity: Inactive (09/27/2020)  Social Connections: Socially Isolated (09/27/2020)  Stress: No Stress Concern Present (09/27/2020)  Tobacco Use: Medium Risk (03/07/2023)    Readmission Risk Interventions    03/05/2023    3:44 PM 01/03/2023   12:44 PM 10/30/2022   10:52 AM  Readmission Risk Prevention Plan  Transportation Screening Complete Complete Complete  Medication Review Oceanographer) Complete Complete Complete  PCP or Specialist appointment within 3-5 days of discharge Not Complete    HRI or Home Care Consult Complete Complete Complete  SW Recovery Care/Counseling Consult Complete Complete Complete  Palliative Care Screening Complete Not Applicable Not Applicable  Skilled Nursing Facility Not Applicable Not Applicable Not Applicable

## 2023-03-10 ENCOUNTER — Inpatient Hospital Stay (HOSPITAL_COMMUNITY): Payer: 59

## 2023-03-10 LAB — RENAL FUNCTION PANEL
Albumin: 2.7 g/dL — ABNORMAL LOW (ref 3.5–5.0)
Anion gap: 10 (ref 5–15)
BUN: 25 mg/dL — ABNORMAL HIGH (ref 8–23)
CO2: 34 mmol/L — ABNORMAL HIGH (ref 22–32)
Calcium: 8.4 mg/dL — ABNORMAL LOW (ref 8.9–10.3)
Chloride: 80 mmol/L — ABNORMAL LOW (ref 98–111)
Creatinine, Ser: 0.5 mg/dL (ref 0.44–1.00)
GFR, Estimated: >60 mL/min (ref >60)
Glucose, Bld: 104 mg/dL — ABNORMAL HIGH (ref 70–99)
Phosphorus: 3.7 mg/dL (ref 2.5–4.6)
Potassium: 3.9 mmol/L (ref 3.5–5.1)
Sodium: 124 mmol/L — ABNORMAL LOW (ref 135–145)

## 2023-03-10 LAB — MAGNESIUM: Magnesium: 1.5 mg/dL — ABNORMAL LOW (ref 1.7–2.4)

## 2023-03-10 MED ORDER — LEVALBUTEROL HCL 1.25 MG/3ML IN NEBU: 1.2500 mg | INHALATION_SOLUTION | Freq: Three times a day (TID) | RESPIRATORY_TRACT | 1 refills | Status: DC

## 2023-03-10 MED ORDER — AMLODIPINE BESYLATE 10 MG PO TABS: 10.0000 mg | ORAL_TABLET | Freq: Every day | ORAL | 11 refills | Status: DC

## 2023-03-10 MED ORDER — GUAIFENESIN-DM 100-10 MG/5ML PO SYRP: 5.0000 mL | ORAL_SOLUTION | ORAL | 0 refills | Status: DC | PRN

## 2023-03-10 MED ORDER — MAGNESIUM SULFATE 2 GM/50ML IV SOLN
2.0000 g | Freq: Once | INTRAVENOUS | Status: AC
  Administered 2023-03-10: 2 g via INTRAVENOUS
  Filled 2023-03-10: qty 50

## 2023-03-10 MED ORDER — PREDNISONE 20 MG PO TABS: 40.0000 mg | ORAL_TABLET | Freq: Every day | ORAL | 0 refills | Status: AC

## 2023-03-10 MED ORDER — BREZTRI AEROSPHERE 160-9-4.8 MCG/ACT IN AERO: 2.0000 | INHALATION_SPRAY | Freq: Two times a day (BID) | RESPIRATORY_TRACT | 11 refills | Status: DC

## 2023-03-10 MED ORDER — FERROUS SULFATE 325 (65 FE) MG PO TABS: 325.0000 mg | ORAL_TABLET | ORAL | 3 refills | Status: DC

## 2023-03-10 MED ORDER — ALBUTEROL SULFATE HFA 108 (90 BASE) MCG/ACT IN AERS: 2.0000 | INHALATION_SPRAY | Freq: Four times a day (QID) | RESPIRATORY_TRACT | 3 refills | Status: DC | PRN

## 2023-03-10 MED ORDER — AZITHROMYCIN 500 MG PO TABS: 500.0000 mg | ORAL_TABLET | Freq: Every day | ORAL | 0 refills | Status: AC

## 2023-03-10 MED ORDER — ALPRAZOLAM 0.25 MG PO TABS: 0.2500 mg | ORAL_TABLET | Freq: Three times a day (TID) | ORAL | 0 refills | Status: DC | PRN

## 2023-03-10 MED ORDER — ACETAMINOPHEN 325 MG PO TABS: 650.0000 mg | ORAL_TABLET | Freq: Four times a day (QID) | ORAL | 0 refills | Status: DC | PRN

## 2023-03-10 MED ORDER — IPRATROPIUM BROMIDE HFA 17 MCG/ACT IN AERS: 2.0000 | INHALATION_SPRAY | Freq: Four times a day (QID) | RESPIRATORY_TRACT | 11 refills | Status: DC | PRN

## 2023-03-10 MED ORDER — IPRATROPIUM-ALBUTEROL 0.5-2.5 (3) MG/3ML IN SOLN: 3.0000 mL | Freq: Four times a day (QID) | RESPIRATORY_TRACT | 2 refills | Status: DC | PRN

## 2023-03-10 NOTE — Plan of Care (Signed)
  Problem: Acute Rehab PT Goals(only PT should resolve) Goal: Pt Will Go Supine/Side To Sit Outcome: Progressing Flowsheets (Taken 03/10/2023 1453) Pt will go Supine/Side to Sit:  Independently  with modified independence Goal: Patient Will Transfer Sit To/From Stand Outcome: Progressing Flowsheets (Taken 03/10/2023 1453) Patient will transfer sit to/from stand: with modified independence Goal: Pt Will Transfer Bed To Chair/Chair To Bed Outcome: Progressing Flowsheets (Taken 03/10/2023 1453) Pt will Transfer Bed to Chair/Chair to Bed: with modified independence Goal: Pt Will Ambulate Outcome: Progressing Flowsheets (Taken 03/10/2023 1453) Pt will Ambulate:  50 feet  with supervision  with rolling walker  with modified independence   2:53 PM, 03/10/23 Ocie Bob, MPT Physical Therapist with [facility] [phone] office [phone] mobile phone

## 2023-03-10 NOTE — Plan of Care (Signed)
  Problem: Education: Goal: Knowledge of General Education information will improve Description: Including pain rating scale, medication(s)/side effects and non-pharmacologic comfort measures Outcome: Adequate for Discharge   Problem: Health Behavior/Discharge Planning: Goal: Ability to manage health-related needs will improve Outcome: Adequate for Discharge   Problem: Clinical Measurements: Goal: Ability to maintain clinical measurements within normal limits will improve Outcome: Adequate for Discharge Goal: Will remain free from infection Outcome: Adequate for Discharge Goal: Diagnostic test results will improve Outcome: Adequate for Discharge Goal: Respiratory complications will improve Outcome: Adequate for Discharge Goal: Cardiovascular complication will be avoided Outcome: Adequate for Discharge   Problem: Activity: Goal: Risk for activity intolerance will decrease Outcome: Adequate for Discharge   Problem: Nutrition: Goal: Adequate nutrition will be maintained Outcome: Adequate for Discharge   Problem: Coping: Goal: Level of anxiety will decrease Outcome: Adequate for Discharge   Problem: Elimination: Goal: Will not experience complications related to bowel motility Outcome: Adequate for Discharge Goal: Will not experience complications related to urinary retention Outcome: Adequate for Discharge   Problem: Pain Managment: Goal: General experience of comfort will improve Outcome: Adequate for Discharge   Problem: Safety: Goal: Ability to remain free from injury will improve Outcome: Adequate for Discharge   Problem: Skin Integrity: Goal: Risk for impaired skin integrity will decrease Outcome: Adequate for Discharge   Problem: Acute Rehab PT Goals(only PT should resolve) Goal: Pt Will Go Supine/Side To Sit Outcome: Adequate for Discharge Goal: Patient Will Transfer Sit To/From Stand Outcome: Adequate for Discharge Goal: Pt Will Transfer Bed To Chair/Chair  To Bed Outcome: Adequate for Discharge Goal: Pt Will Ambulate Outcome: Adequate for Discharge   

## 2023-03-10 NOTE — Discharge Instructions (Addendum)
1)Follow - up with Dr. 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

## 2023-03-10 NOTE — Evaluation (Signed)
Physical Therapy Evaluation Patient Details Name: [name] MRN: [id number] DOB: 1956/11/04 Today's Date: 03/10/2023  History of Present Illness  [name] is a 67 y.o. female with medical history significant of chronic respiratory failure with hypoxia on supplemental oxygen via [place] at 4 LPM, COPD Gold stage IV, high-grade serous ovarian cancer, hypertension, hyperlipidemia, GERD who presents to the emergency department due to shortness of breath.  Patient was admitted on 4/15 and was discharged on 4/18 due to acute on chronic respiratory failure with hypoxia secondary to COPD exacerbation which was treated with breathing treatment, antibiotics and steroids.  Patient states that on returning home, shortness of breath worsens again, she tried home inhaler without any improvement in symptoms, so she activated EMS and patient was taken to the ED for further evaluation and management.  Patient was not aware of anything that may have triggered the exacerbation, she endorsed the boyfriend smokes cigarettes, but he usually smokes outside the house.  She complained of pain on right side of upper and lower back which started with the symptoms yesterday.  She denies seasonal allergies, leg swelling, fever, nausea, vomiting.   Clinical Impression  Patient functioning near baseline for functional mobility and gait other than limited for walking in room mostly due to SOB with SpO2 dropping from 93% to 88% while on 4 LPM, after sitting up at bedside O2 increased to 5 LPM with SpO2 increasing to 99%.  Patient tolerated sitting up at bedside after therapy.  Patient will benefit from continued skilled physical therapy in hospital and recommended venue below to increase strength, balance, endurance for safe ADLs and gait.         Recommendations for follow up therapy are one component of a multi-disciplinary discharge planning process, led by the attending physician.  Recommendations may be updated based on patient  status, additional functional criteria and insurance authorization.  Follow Up Recommendations       Assistance Recommended at Discharge Set up Supervision/Assistance  Patient can return home with the following  A little help with walking and/or transfers;A little help with bathing/dressing/bathroom;Help with stairs or ramp for entrance;Assistance with cooking/housework    Equipment Recommendations None recommended by PT  Recommendations for Other Services       Functional Status Assessment Patient has had a recent decline in their functional status and demonstrates the ability to make significant improvements in function in a reasonable and predictable amount of time.     Precautions / Restrictions Precautions Precautions: Fall Restrictions Weight Bearing Restrictions: No      Mobility  Bed Mobility Overal bed mobility: Modified Independent                  Transfers Overall transfer level: Modified independent                      Ambulation/Gait Ambulation/Gait assistance: Supervision, Min guard Gait Distance (Feet): 20 Feet Assistive device: Rolling walker (2 wheels) Gait Pattern/deviations: Decreased step length - right, Decreased step length - left, Decreased stride length, Trunk flexed Gait velocity: decreased     General Gait Details: slow labored movement without loss of balance, limited mostly due to c/o SOB with SpO2 at 88% while on 4 LPM  Stairs            Wheelchair Mobility    Modified Rankin (Stroke Patients Only)       Balance Overall balance assessment: Needs assistance Sitting-balance support: Feet supported, No upper extremity supported  Sitting balance-Leahy Scale: Good Sitting balance - Comments: seated at EOB   Standing balance support: During functional activity, No upper extremity supported Standing balance-Leahy Scale: Fair Standing balance comment: fair/good using RW                              Pertinent Vitals/Pain Pain Assessment Pain Assessment: No/denies pain    Home Living Family/patient expects to be discharged to:: Private residence Living Arrangements: Spouse/significant other Available Help at Discharge: Family;Available 24 hours/day Type of Home: Apartment Home Access: Level entry       Home Layout: One level Home Equipment: Rollator (4 wheels);Shower seat      Prior Function Prior Level of Function : Needs assist       Physical Assist : Mobility (physical);ADLs (physical) Mobility (physical): Bed mobility;Transfers;Gait;Stairs   Mobility Comments: household ambulator using Rollator ADLs Comments: Assisted by family     Hand Dominance        Extremity/Trunk Assessment   Upper Extremity Assessment Upper Extremity Assessment: Overall WFL for tasks assessed    Lower Extremity Assessment Lower Extremity Assessment: Generalized weakness    Cervical / Trunk Assessment Cervical / Trunk Assessment: Kyphotic  Communication   Communication: No difficulties  Cognition Arousal/Alertness: Awake/alert Behavior During Therapy: WFL for tasks assessed/performed Overall Cognitive Status: Within Functional Limits for tasks assessed                                          General Comments      Exercises     Assessment/Plan    PT Assessment Patient needs continued PT services  PT Problem List Decreased strength;Decreased activity tolerance;Decreased balance;Decreased mobility       PT Treatment Interventions DME instruction;Gait training;Stair training;Functional mobility training;Therapeutic activities;Therapeutic exercise;Patient/family education;Balance training    PT Goals (Current goals can be found in the Care Plan section)  Acute Rehab PT Goals Patient Stated Goal: return home with family to assist PT Goal Formulation: With patient Time For Goal Achievement: 03/13/23 Potential to Achieve Goals: Good    Frequency  Min 3X/week     Co-evaluation               AM-PAC PT "6 Clicks" Mobility  Outcome Measure Help needed turning from your back to your side while in a flat bed without using bedrails?: None Help needed moving from lying on your back to sitting on the side of a flat bed without using bedrails?: None Help needed moving to and from a bed to a chair (including a wheelchair)?: A Little Help needed standing up from a chair using your arms (e.g., wheelchair or bedside chair)?: None Help needed to walk in hospital room?: A Little Help needed climbing 3-5 steps with a railing? : A Lot 6 Click Score: 20    End of Session Equipment Utilized During Treatment: Oxygen Activity Tolerance: Patient tolerated treatment well;Patient limited by fatigue Patient left: in bed;with call bell/phone within reach Nurse Communication: Mobility status PT Visit Diagnosis: Unsteadiness on feet (R26.81);Other abnormalities of gait and mobility (R26.89);Muscle weakness (generalized) (M62.81)    Time: 1610-9604 PT Time Calculation (min) (ACUTE ONLY): 20 min   Charges:   PT Evaluation $PT Eval Moderate Complexity: 1 Mod PT Treatments $Therapeutic Activity: 8-22 mins        2:52 PM, 03/10/23 Ocie Bob, MPT Physical Therapist  with [facility] [phone] office [phone] mobile phone

## 2023-03-10 NOTE — Progress Notes (Signed)
Pt currently eating breakfast. Only c/o is left lateral rib area pain, worse with deep breath. Pt states breathing, "..is fine as long as I don't move around. If I get up or have to move, boy then I really can't breathe!" Pt was able to perform IS to 750 level x4 with good effort before becoming too SOB to continue.

## 2023-03-10 NOTE — TOC Initial Note (Addendum)
Transition of Care [facility]) - Initial/Assessment Note    Patient Details  Name: [name] MRN: [id number] Date of Birth: 13-Jun-1956  Transition [facility]) CM/SW Contact:    Annice Needy, LCSW Phone Number: 03/10/2023, 12:38 PM  Clinical Narrative:                 Patient from home with significant other and her daughter. On home o2, 4L. Hasn't been driving, independent with ADLs. Has walker, portable concentrator in the home. Considered high risk for readmission.  PT recommends HHPT. Patient is agreeable to HHPT. Morrie Sheldon with [facility] accepts referral.  Expected Discharge Plan: Home/Self Care Barriers to Discharge: Continued Medical Work up   Patient Goals and CMS Choice Patient states their goals for this hospitalization and ongoing recovery are:: to go home          Expected Discharge Plan and Services                                              Prior Living Arrangements/Services                       Activities of Daily Living Home Assistive Devices/Equipment: Environmental consultant (specify type) ADL Screening (condition at time of admission) Patient's cognitive ability adequate to safely complete daily activities?: Yes Is the patient deaf or have difficulty hearing?: No Does the patient have difficulty seeing, even when wearing glasses/contacts?: No Does the patient have difficulty concentrating, remembering, or making decisions?: No Patient able to express need for assistance with ADLs?: Yes Does the patient have difficulty dressing or bathing?: No Independently performs ADLs?: Yes (appropriate for developmental age) Does the patient have difficulty walking or climbing stairs?: Yes Weakness of Legs: Both Weakness of Arms/Hands: None  Permission Sought/Granted                  Emotional Assessment              Admission diagnosis:  Acute exacerbation of chronic obstructive pulmonary disease (COPD) [J44.1] Dyspnea, unspecified type  [R06.00] COPD with acute exacerbation [J44.1] Patient Active Problem List   Diagnosis Date Noted   COPD with acute exacerbation 03/08/2023   Acute exacerbation of chronic obstructive pulmonary disease (COPD) 03/07/2023   Elevated brain natriuretic peptide (BNP) level 03/07/2023   Leukocytosis 03/07/2023   COPD (chronic obstructive pulmonary disease) 03/04/2023   Sepsis due to pneumonia 10/29/2022   Dyslipidemia 10/29/2022   Essential tremor 10/29/2022   GERD without esophagitis 10/29/2022   Peripheral neuropathy 10/29/2022   RSV (respiratory syncytial virus pneumonia) 10/29/2022   Symptomatic anemia 10/03/2022   Macrocytic anemia 10/03/2022   Pancytopenia 10/03/2022   Thrush 09/11/2022   Odynophagia 08/26/2022   COPD exacerbation 07/31/2022   GERD (gastroesophageal reflux disease) 07/15/2022   ABLA (acute blood loss anemia)    Esophagitis 06/28/2022   Hematochezia 05/03/2022   E coli bacteremia 03/11/2022   Hypokalemia 03/11/2022   Hyponatremia 03/10/2022   Thrombocytopenia 03/10/2022   Acute on chronic respiratory failure with hypoxia and hypercapnia 12/30/2021   Deficiency anemia 05/23/2021   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   Sepsis due to undetermined organism 12/18/2020   Lobar pneumonia 12/18/2020   Syncope 12/17/2020   Genetic testing 08/01/2020   Ovarian cancer 07/07/2020   Port-A-Cath in place 07/03/2020   Family history of prostate cancer  07/03/2020   Goals of care, counseling/discussion 07/01/2020   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   Pneumonia 09/13/2018   Acute on chronic respiratory failure 01/22/2018   CAP (community acquired pneumonia) 01/18/2016   Iron deficiency anemia 11/07/2015   Chronic respiratory failure with hypoxia 11/07/2015   Acute on chronic respiratory failure with hypoxia 11/07/2015   Mixed hyperlipidemia 11/07/2015   COPD GOLD 4/ bronchiectasis/02 dep 11/06/2015   Essential hypertension 11/06/2015   PCP:  Abram Sander, MD Pharmacy:   [facility] [address] [place] - [address] [place] [address] Phone: [phone] Fax: [phone]  [place] [facility] [address] [place] [address] Phone: [phone] Fax: [phone]     Social Determinants of Health (SDOH) Social History: SDOH Screenings   Food Insecurity: No Food Insecurity (03/07/2023)  Housing: Medium Risk (03/07/2023)  Transportation Needs: Unmet Transportation Needs (03/07/2023)  Utilities: Not At Risk (03/07/2023)  Alcohol Screen: Low Risk  (09/27/2020)  Depression (PHQ2-9): Low Risk  (09/27/2020)  Financial Resource Strain: Low Risk  (09/27/2020)  Physical Activity: Inactive (09/27/2020)  Social Connections: Socially Isolated (09/27/2020)  Stress: No Stress Concern Present (09/27/2020)  Tobacco Use: Medium Risk (03/07/2023)   SDOH Interventions: Housing Interventions: Inpatient TOC Transportation Interventions: Inpatient TOC   Readmission Risk Interventions    03/05/2023    3:44 PM 01/03/2023   12:44 PM 10/30/2022   10:52 AM  Readmission Risk Prevention Plan  Transportation Screening Complete Complete Complete  Medication Review Oceanographer) Complete Complete Complete  PCP or Specialist appointment within 3-5 days of discharge Not Complete    HRI or Home Care Consult Complete Complete Complete  SW Recovery Care/Counseling Consult Complete Complete Complete  Palliative Care Screening Complete Not Applicable Not Applicable  Skilled Nursing Facility Not Applicable Not Applicable Not Applicable

## 2023-03-10 NOTE — Progress Notes (Signed)
Returned to unit

## 2023-03-10 NOTE — Discharge Summary (Signed)
[name], is a 67 y.o. female  DOB 1956-06-22  MRN [id number].  Admission date:  03/06/2023  Admitting Physician  Shon Hale, MD  Discharge Date:  03/10/2023   Primary MD  Abram Sander, MD  Recommendations for primary care physician for things to follow:   1)Follow - up with Dr. 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 -Address: [address], [place], [place] [address] Phone: [phone] OR call [phone] -Patient needs to be seen in the [place] office, Not in [place] 2)Avoid drinking too much water as this makes your sodium Low--other to drink other liquids  3)Repeat BMP Blood Test with Abram Sander, MD (Primary Care Physician) in 4 to 6 days  4)you need oxygen at home at 4 L via nasal cannula continuously while awake and while asleep--- smoking or having open fires around oxygen can cause fire, significant injury and death  Admission Diagnosis  Acute exacerbation of chronic obstructive pulmonary disease (COPD) [J44.1] Dyspnea, unspecified type [R06.00] COPD with acute exacerbation [J44.1]   Discharge Diagnosis  Acute exacerbation of chronic obstructive pulmonary disease (COPD) [J44.1] Dyspnea, unspecified type [R06.00] COPD with acute exacerbation [J44.1]    Principal Problem:   Acute exacerbation of chronic obstructive pulmonary disease (COPD) Active Problems:   Ovarian cancer   Essential hypertension   Mixed hyperlipidemia   Hyponatremia   Elevated brain natriuretic peptide (BNP) level   Leukocytosis   COPD with acute exacerbation      Past Medical History:  Diagnosis Date   Anemia    Aortic atherosclerosis    Arthritis    Asthma    Bilateral carotid artery stenosis    Cancer    Gastric Cancer   COPD (chronic obstructive pulmonary disease)    High cholesterol    Hyperlipidemia     Hypertension    Neuropathy    Osteoarthritis    Ovarian cancer    Oxygen dependent    Peripheral vascular disease    Peritoneal carcinoma    Pneumonia 2019   Port-A-Cath in place 07/03/2020   Pulmonary emphysema     Past Surgical History:  Procedure Laterality Date   BIOPSY  06/30/2022   Procedure: BIOPSY;  Surgeon: Lanelle Bal, DO;  [facility];  Service: Endoscopy;;  gastric   BIOPSY  09/03/2022   Procedure: BIOPSY;  Surgeon: Dolores Frame, MD;  [facility];  Service: Gastroenterology;;   COLONOSCOPY WITH PROPOFOL N/A 09/03/2022   Procedure: COLONOSCOPY WITH PROPOFOL;  Surgeon: Dolores Frame, MD;  [facility];  Service: Gastroenterology;  Laterality: N/A;  900 ASA 3   ESOPHAGEAL BRUSHING  09/03/2022   Procedure: ESOPHAGEAL BRUSHING;  Surgeon: Marguerita Merles, Reuel Boom, MD;  [facility];  Service: Gastroenterology;;   ESOPHAGOGASTRODUODENOSCOPY (EGD) WITH PROPOFOL N/A 02/17/2021   Procedure: ESOPHAGOGASTRODUODENOSCOPY (EGD) WITH PROPOFOL;  Surgeon: Dolores Frame, MD;  [facility];  Service: Gastroenterology;  Laterality: N/A;  ESOPHAGOGASTRODUODENOSCOPY (EGD) WITH PROPOFOL N/A 05/04/2022   Procedure: ESOPHAGOGASTRODUODENOSCOPY (EGD) WITH PROPOFOL;  Surgeon: Corbin Ade, MD;  [facility];  Service: Endoscopy;  Laterality: N/A;   ESOPHAGOGASTRODUODENOSCOPY (EGD) WITH PROPOFOL N/A 06/30/2022   Procedure: ESOPHAGOGASTRODUODENOSCOPY (EGD) WITH PROPOFOL;  Surgeon: Lanelle Bal, DO;  [facility];  Service: Endoscopy;  Laterality: N/A;   ESOPHAGOGASTRODUODENOSCOPY (EGD) WITH PROPOFOL N/A 09/03/2022   Procedure: ESOPHAGOGASTRODUODENOSCOPY (EGD) WITH PROPOFOL;  Surgeon: Dolores Frame, MD;  [facility];  Service: Gastroenterology;  Laterality: N/A;   HALLUX VALGUS BASE WEDGE Right 06/09/2015   Procedure: Base wedge osteotomy with modified  McBride right foot ;  Surgeon: Linus Galas, MD;  Location: [facility];  Service: Podiatry;  Laterality: Right;   PORTACATH PLACEMENT Left 06/28/2020   Procedure: PORT-A-CATHETER PLACEMENT LEFT CHEST (attached catheter in left subclavian);  Surgeon: Lucretia Roers, MD;  Location: [facility];  Service: General;  Laterality: Left;   TUBAL LIGATION     VIDEO BRONCHOSCOPY WITH ENDOBRONCHIAL NAVIGATION N/A 03/09/2021   Procedure: VIDEO BRONCHOSCOPY WITH ENDOBRONCHIAL NAVIGATION;  Surgeon: Vida Rigger, MD;  Location: [facility];  Service: Thoracic;  Laterality: N/A;   VIDEO BRONCHOSCOPY WITH ENDOBRONCHIAL ULTRASOUND N/A 03/09/2021   Procedure: VIDEO BRONCHOSCOPY WITH ENDOBRONCHIAL ULTRASOUND;  Surgeon: Vida Rigger, MD;  Location: [facility];  Service: Thoracic;  Laterality: N/A;     HPI  from the history and physical done on the day of admission:   HPI: [name] is a 67 y.o. female with medical history significant of chronic respiratory failure with hypoxia on supplemental oxygen via [place] at 4 LPM, COPD Gold stage IV, high-grade serous ovarian cancer, hypertension, hyperlipidemia, GERD who presents to the emergency department due to shortness of breath. Patient was admitted on 4/15 and was discharged on 4/18 due to acute on chronic respiratory failure with hypoxia secondary to COPD exacerbation which was treated with breathing treatment, antibiotics and steroids.  Patient states that on returning home, shortness of breath worsens again, she tried home inhaler without any improvement in symptoms, so she activated EMS and patient was taken to the ED for further evaluation and management.  Patient was not aware of anything that may have triggered the exacerbation, she endorsed the boyfriend smokes cigarettes, but he usually smokes outside the house.  She complained of pain on right side of upper and lower back which started with the symptoms yesterday.  She denies seasonal allergies, leg swelling, fever, nausea,  vomiting.   ED Course:  In the emergency department, she was tachypneic and tachycardic, BP was 181/107, temperature 98.4 F, O2 sat was 91% on 4 LPM of oxygen.  In the ED showed normocytic anemia, WBC 13.3.  BMP showed sodium 128, potassium 3.9, chloride 83, bicarb 33, blood glucose 138, BUN 16, creatinine 0.58, albumin 3.4, magnesium 1.8, troponin x 1 - 17, BNP 201. Chest x-ray showed No acute chest findings.  COPD with biapical scarring change.  Aortic atherosclerosis. She was treated with IV Lasix 20 mg x 1, DuoNeb was provided and IV fentanyl at 0.5 mcg x 1 was given due to right-sided back pain.  Hospitalist was asked to admit patient for further evaluation and management.    Review of Systems: Review of systems as noted in the HPI. All other systems reviewed and are negative.    Hospital Course:   Assessment and Plan: Brief Summary:-  67 y.o. female with medical history significant of chronic respiratory failure with hypoxia on supplemental oxygen  via [place] at 4 LPM, COPD Gold stage IV, high-grade serous ovarian cancer, hypertension, hyperlipidemia, GERD admitted on 03/07/2023 with acute COPD exacerbation -After being discharged on 03/06/2023.   A/p 1) acute COPD exacerbation---  -Treated with IV Solu-Medrol, mucolytics bronchodilators and  azithromycin -ABG reflects chronic hypoxemia on chronic hypercapnia -Ambulating with physical therapist --Cough and dyspnea improving -Oxygen requirement improving--back to baseline of 4 L via nasal cannula - repeat chest x-ray on 03/10/2023 without new acute findings   2)HFpEF/acute on chronic diastolic dysfunction CHF exacerbation -Increase shortness of breath and elevated BNP noted -Echo with EF of 55 to 60%, grade 1 diastolic dysfunction no mitral stenosis, no aortic stenosis and no wall motion abnormalities -Treated with IV Lasix, much improved -Hold off on further Lasix due to concern for overdiuresis and sodium drifting down -Repeat chest  x-ray on 03/10/2023 without CHF findings or other acute abnormalities   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 Ellin Saba Continue niraparib   5)Iron deficiency anemia -Hgb stable above 10 Continue ferrous sulfate   6)HTN--- continue amlodipine   7) acute on chronic hypoxic respiratory failure--- secondary to #1 and #2 above At baseline Patient uses 4 L of oxygen via nasal cannula -Much improved, appears back to baseline -Please see 1 above   8) persistent leukocytosis most likely due to steroids for COPD Consider  repeat chest x-ray on 03/10/2023  9) hyponatremia--- sodium drifted down with IV Lasix,  -Serum osmolality admission was 276 -patient said that her mouth was dry, so she has been drinking a lot of free water -Symptoms should improve with discontinuation of Lasix and free water restriction -Repeat BMP within a week   10) Hypomagnesemia--replaced   Disposition: The patient is from: Home              Anticipated d/c is to: Home  Discharge Condition: Stable  Follow UP   Follow-up Information     Abram Sander, MD. Call in 4 day(s).   Specialty: Family Medicine Why: Repeat BMP Blood Test with Abram Sander, MD (Primary Care Physician) in 4 to 6 days Contact information: [address] [place] [address] [phone]                Diet and Activity recommendation:  As advised  Discharge Instructions    Discharge Instructions     Call MD for:  difficulty breathing, headache or visual disturbances   Complete by: As directed    Call MD for:  persistant dizziness or light-headedness   Complete by: As directed    Call MD for:  persistant nausea and vomiting   Complete  by: As directed    Call MD for:  temperature >100.4   Complete by: As directed    Diet - low sodium heart healthy   Complete by: As directed    Discharge instructions   Complete by: As directed    1)Follow - up with Dr. 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 -Address: [address] [place], [place] [address] Phone: [phone] OR call [phone] -Patient needs to be seen in the [place] office, Not in [place] 2)Avoid drinking too much water as this makes your sodium Low--other to drink other liquids  3)Repeat BMP Blood  Test with Abram Sander, MD (Primary Care Physician) in 4 to 6 days  4)you need oxygen at home at 4 L via nasal cannula continuously while awake and while asleep--- smoking or having open fires around oxygen can cause fire, significant injury and death   Increase activity slowly   Complete by: As directed        Discharge Medications     Allergies as of 03/10/2023   No Known Allergies      Medication List     TAKE these medications    acetaminophen 325 MG tablet Commonly known as: TYLENOL Take 2 tablets (650 mg total) by mouth every 6 (six) hours as needed for mild pain or fever (or Fever >/= 101).   albuterol 108 (90 Base) MCG/ACT inhaler Commonly known as: VENTOLIN HFA Inhale 2 puffs into the lungs every 6 (six) hours as needed for wheezing or shortness of breath.   ALPRAZolam 0.25 MG tablet Commonly known as: XANAX Take 1 tablet (0.25 mg total) by mouth 3 (three) times daily as needed for anxiety.   amLODipine 10 MG tablet Commonly known as: NORVASC Take 1 tablet (10 mg total) by mouth daily. What changed:  medication strength how much to take when to take this   aspirin EC 81 MG tablet Take 1 tablet (81 mg total) by mouth daily with breakfast.   atorvastatin 40 MG tablet Commonly known as: LIPITOR Take 1 tablet (40 mg total) by mouth daily.    azithromycin 500 MG tablet Commonly known as: ZITHROMAX Take 1 tablet (500 mg total) by mouth daily for 5 days.   Breztri Aerosphere 160-9-4.8 MCG/ACT Aero Generic drug: Budeson-Glycopyrrol-Formoterol Inhale 2 puffs into the lungs 2 (two) times daily.   ferrous sulfate 325 (65 FE) MG tablet Take 1 tablet (325 mg total) by mouth every other day.   guaiFENesin-dextromethorphan 100-10 MG/5ML syrup Commonly known as: ROBITUSSIN DM Take 5 mLs by mouth every 4 (four) hours as needed for cough.   ipratropium 17 MCG/ACT inhaler Commonly known as: ATROVENT HFA Inhale 2 puffs into the lungs every 6 (six) hours as needed for wheezing.   ipratropium-albuterol 0.5-2.5 (3) MG/3ML Soln Commonly known as: DUONEB Take 3 mLs by nebulization every 6 (six) hours as needed.   levalbuterol 1.25 MG/3ML nebulizer solution Commonly known as: XOPENEX Take 1.25 mg by nebulization in the morning, at noon, and at bedtime.   magnesium oxide 400 (240 Mg) MG tablet Commonly known as: MAG-OX Take 1 tablet (400 mg total) by mouth 2 (two) times daily.   nortriptyline 25 MG capsule Commonly known as: PAMELOR Take 25 mg by mouth at bedtime.   OXYGEN Inhale 3 L into the lungs continuous.   pantoprazole 40 MG tablet Commonly known as: Protonix Take 1 tablet (40 mg total) by mouth 2 (two) times daily. What changed: when to take this   potassium chloride 10 MEQ tablet Commonly known as: KLOR-CON M Take 10 mEq by mouth daily.   predniSONE 20 MG tablet Commonly known as: DELTASONE Take 2 tablets (40 mg total) by mouth daily with breakfast for 5 days. What changed:  medication strength See the new instructions.   primidone 50 MG tablet Commonly known as: MYSOLINE Take 50 mg by mouth at bedtime.   Zejula 100 MG tablet Generic drug: niraparib tosylate Take 1 tablet (100 mg total) by mouth daily. May take at bedtime to reduce nausea and vomiting.       Major procedures and Radiology Reports -  PLEASE review detailed and final reports for all details, in brief -   DG Chest 2 View  Result Date: 03/10/2023 CLINICAL DATA:  Dyspnea, COPD EXAM: CHEST - 2 VIEW COMPARISON:  Previous studies including the examination of 03/07/2023 FINDINGS: Cardiac size is within normal limits. Increase in the AP diameter of chest suggests COPD. There are no signs of pulmonary edema or new focal pulmonary consolidation. Increased interstitial markings in both upper lung fields may suggest scarring. There is no pleural effusion or pneumothorax. IMPRESSION: COPD.  There are no new infiltrates or signs of pulmonary edema. Electronically Signed   By: Ernie Avena M.D.   On: 03/10/2023 08:46   ECHOCARDIOGRAM COMPLETE  Result Date: 03/07/2023    ECHOCARDIOGRAM REPORT   Patient Name:   [name] Date of Exam: 03/07/2023 Medical Rec #:  [id number]     Height:       60.0 in Accession #:    [id number]    Weight:       115.7 lb Date of Birth:  August 31, 1956     BSA:          1.479 m Patient Age:    66 years      BP:           143/115 mmHg Patient Gender: F             HR:           92 bpm. [facility] Procedure: 2D Echo, Cardiac Doppler and Color Doppler Indications:    CHF-Acute Diastolic I50.31  History:        Patient has prior history of Echocardiogram examinations, most                 recent 12/18/2020. Cancer; Risk Factors:Hypertension and                 Dyslipidemia.  Sonographer:    Aron Baba Referring Phys: [id number] OLADAPO ADEFESO  Sonographer Comments: Suboptimal parasternal window. Image acquisition challenging due to patient body habitus, Image acquisition challenging due to COPD and Image acquisition challenging due to respiratory motion. IMPRESSIONS  1. Left ventricular ejection fraction, by estimation, is 55 to 60%. The left ventricle has normal function. The left ventricle has no regional wall motion abnormalities. Left ventricular diastolic parameters are consistent with Grade I diastolic  dysfunction (impaired relaxation).  2. Right ventricular systolic function is normal. The right ventricular size is normal.  3. The mitral valve is normal in structure. No evidence of mitral valve regurgitation. No evidence of mitral stenosis.  4. The aortic valve is tricuspid. There is mild calcification of the aortic valve. There is mild thickening of the aortic valve. Aortic valve regurgitation is not visualized. No aortic stenosis is present.  5. The inferior vena cava is normal in size with greater than 50% respiratory variability, suggesting right atrial pressure of 3 mmHg. FINDINGS  Left Ventricle: Left ventricular ejection fraction, by estimation, is 55 to 60%. The left ventricle has normal function. The left ventricle has no regional wall motion abnormalities. The left ventricular internal cavity size was normal in size. There is  no left ventricular hypertrophy. Left ventricular diastolic parameters are consistent with Grade I diastolic dysfunction (impaired relaxation). Normal left ventricular filling pressure. Right Ventricle: The right ventricular size is normal. Right vetricular wall thickness was not well visualized. Right ventricular systolic function is normal. Left Atrium: Left atrial size was normal in size. Right Atrium: Right atrial size was  normal in size. Pericardium: There is no evidence of pericardial effusion. Mitral Valve: The mitral valve is normal in structure. There is mild thickening of the mitral valve leaflet(s). There is mild calcification of the mitral valve leaflet(s). Mild mitral annular calcification. No evidence of mitral valve regurgitation. No evidence of mitral valve stenosis. Tricuspid Valve: The tricuspid valve is normal in structure. Tricuspid valve regurgitation is not demonstrated. No evidence of tricuspid stenosis. Aortic Valve: The aortic valve is tricuspid. There is mild calcification of the aortic valve. There is mild thickening of the aortic valve. There is mild  aortic valve annular calcification. Aortic valve regurgitation is not visualized. No aortic stenosis  is present. Aortic valve mean gradient measures 2.6 mmHg. Aortic valve peak gradient measures 5.3 mmHg. Aortic valve area, by VTI measures 3.06 cm. Pulmonic Valve: The pulmonic valve was not well visualized. Pulmonic valve regurgitation is not visualized. No evidence of pulmonic stenosis. Aorta: The aortic root is normal in size and structure. Venous: The inferior vena cava is normal in size with greater than 50% respiratory variability, suggesting right atrial pressure of 3 mmHg. IAS/Shunts: No atrial level shunt detected by color flow Doppler.  LEFT VENTRICLE PLAX 2D LVIDd:         4.30 cm   Diastology LVIDs:         2.70 cm   LV e' medial:    5.00 cm/s LV PW:         1.10 cm   LV E/e' medial:  16.0 LV IVS:        1.00 cm   LV e' lateral:   11.00 cm/s LVOT diam:     2.00 cm   LV E/e' lateral: 7.3 LV SV:         52 LV SV Index:   35 LVOT Area:     3.14 cm  RIGHT VENTRICLE RV S prime:     16.20 cm/s TAPSE (M-mode): 1.5 cm LEFT ATRIUM             Index        RIGHT ATRIUM           Index LA diam:        2.50 cm 1.69 cm/m   RA Area:     10.90 cm LA Vol (A2C):   33.2 ml 22.44 ml/m  RA Volume:   19.80 ml  13.38 ml/m LA Vol (A4C):   32.2 ml 21.76 ml/m LA Biplane Vol: 33.0 ml 22.31 ml/m  AORTIC VALVE AV Area (Vmax):    2.69 cm AV Area (Vmean):   2.77 cm AV Area (VTI):     3.06 cm AV Vmax:           115.11 cm/s AV Vmean:          73.766 cm/s AV VTI:            0.169 m AV Peak Grad:      5.3 mmHg AV Mean Grad:      2.6 mmHg LVOT Vmax:         98.50 cm/s LVOT Vmean:        65.100 cm/s LVOT VTI:          0.165 m LVOT/AV VTI ratio: 0.97  AORTA Ao Root diam: 3.80 cm MITRAL VALVE MV Area (PHT): 4.71 cm     SHUNTS MV Decel Time: 161 msec     Systemic VTI:  0.16 m MV E velocity: 79.90 cm/s   Systemic Diam: 2.00  cm MV A velocity: 133.00 cm/s MV E/A ratio:  0.60 Dina Rich MD Electronically signed by Dina Rich  MD Signature Date/Time: 03/07/2023/2:15:30 PM    Final    CT Angio Chest PE W and/or Wo Contrast  Result Date: 03/07/2023 CLINICAL DATA:  67 year old female history of worsening shortness of breath. EXAM: CT ANGIOGRAPHY CHEST WITH CONTRAST TECHNIQUE: Multidetector CT imaging of the chest was performed using the standard protocol during bolus administration of intravenous contrast. Multiplanar CT image reconstructions and MIPs were obtained to evaluate the vascular anatomy. RADIATION DOSE REDUCTION: This exam was performed according to the departmental dose-optimization program which includes automated exposure control, adjustment of the mA and/or kV according to patient size and/or use of iterative reconstruction technique. CONTRAST:  OMNIPAQUE IOHEXOL 350 MG/ML SOLN COMPARISON:  Chest CTA 11/15/2022. FINDINGS: Cardiovascular: No filling defects within the pulmonary arterial tree to suggest pulmonary embolism. Heart size is normal. There is no significant pericardial fluid, thickening or pericardial calcification. There is aortic atherosclerosis, as well as atherosclerosis of the great vessels of the mediastinum and the coronary arteries, including calcified atherosclerotic plaque in the left main, left anterior descending, left circumflex and right coronary arteries. Mediastinum/Nodes: No pathologically enlarged mediastinal or hilar lymph nodes. Esophagus is unremarkable in appearance. No axillary lymphadenopathy. Lungs/Pleura: Diffuse bronchial wall thickening with thickening of the peribronchovascular interstitium, scattered areas of cylindrical bronchiectasis, peripheral bronchiolectasis, and extensive widespread peribronchovascular micro and macronodularity, most compatible with widespread areas of mucoid impaction within terminal bronchioles in the setting of chronic atypical infection. No larger more suspicious appearing pulmonary nodules or masses are noted. No confluent consolidative airspace  disease. No pleural effusions. There is also a background of moderate centrilobular and paraseptal emphysema. Upper Abdomen: Aortic atherosclerosis. Musculoskeletal: There are no aggressive appearing lytic or blastic lesions noted in the visualized portions of the skeleton. Review of the MIP images confirms the above findings. IMPRESSION: 1. No evidence of pulmonary embolism. 2. The appearance of the chest is compatible with chronic atypical infection, such as mycobacterium avium intracellulare (MAI). Outpatient referral to Pulmonology for further clinical evaluation is recommended. 3. Moderate centrilobular and paraseptal emphysema; imaging findings suggestive of underlying COPD. 4. Aortic atherosclerosis, in addition to left main and three-vessel coronary artery disease. Please note that although the presence of coronary artery calcium documents the presence of coronary artery disease, the severity of this disease and any potential stenosis cannot be assessed on this non-gated CT examination. Assessment for potential risk factor modification, dietary therapy or pharmacologic therapy may be warranted, if clinically indicated. Aortic Atherosclerosis (ICD10-I70.0) and Emphysema (ICD10-J43.9). Electronically Signed   By: Trudie Reed M.D.   On: 03/07/2023 06:13   DG Chest Portable 1 View  Result Date: 03/07/2023 CLINICAL DATA:  Shortness of breath. EXAM: PORTABLE CHEST 1 VIEW COMPARISON:  Portable chest 03/03/2023 FINDINGS: The lungs are emphysematous with biapical scarring change, additional linear scarring or atelectasis in the lateral base. No focal pneumonia is evident. Overall aeration seems unchanged. There is no pleural effusion. Mediastinum is stable. The aortic arch is heavily calcified. Heart size and vascular pattern are normal. Osteopenia. No acute osseous findings or changes. Healed fracture deformity of posterolateral left 6th rib. Left chest subclavian approach port catheter again terminates at  the superior cavoatrial junction. IMPRESSION: No acute chest findings. COPD with biapical scarring change. Aortic atherosclerosis. Electronically Signed   By: Almira Bar M.D.   On: 03/07/2023 01:38   DG Chest Port 1 View  Result Date: 03/03/2023 CLINICAL DATA:  Shortness of breath. EXAM: PORTABLE CHEST 1 VIEW COMPARISON:  January 02, 2023. FINDINGS: The heart size and mediastinal contours are within normal limits. Left subclavian Port-A-Cath is unchanged in position. Stable bibasilar densities are noted most consistent with scarring or subsegmental atelectasis. Bilateral upper lobe scarring is noted as well. The visualized skeletal structures are unremarkable. IMPRESSION: Stable bilateral lung opacities are noted concerning for scarring or subsegmental atelectasis. Electronically Signed   By: Lupita Raider M.D.   On: 03/03/2023 15:21    Micro Results  Recent Results (from the past 240 hour(s))  MRSA Next Gen by PCR, Nasal     Status: None   Collection Time: 03/03/23  5:56 PM   Specimen: Nasal Mucosa; Nasal Swab  Result Value Ref Range Status   MRSA by PCR Next Gen NOT DETECTED NOT DETECTED Final    Comment: (NOTE) The GeneXpert MRSA Assay (FDA approved for NASAL specimens only), is one component of a comprehensive MRSA colonization surveillance program. It is not intended to diagnose MRSA infection nor to guide or monitor treatment for MRSA infections. Test performance is not FDA approved in patients less than 41 years old. Performed at [facility], [address], [place], [place] [address]    Today   Subjective    Keshara Kiger today has no new complaints No fever  Or chills   No Nausea, Vomiting or Diarrhea Ambulated physical therapist, mild dyspnea on exertion.Marland Kitchen  No chest pains no palpitations no dizziness   -Her daughter is at bedside---questions answered      Patient has been seen and examined prior to discharge   Objective   Blood pressure (!) 147/74, pulse 94,  temperature 98.4 F (36.9 C), temperature source Oral, resp. rate 18, height 5' (1.524 m), weight 51.1 kg, SpO2 93 %.   Intake/Output Summary (Last 24 hours) at 03/10/2023 1802 Last data filed at 03/10/2023 1300 Gross per 24 hour  Intake 240 ml  Output 200 ml  Net 40 ml    Exam Gen:- Awake Alert, dyspnea on exertion HEENT:- [place].AT, No sclera icterus Nose-[place] 4L/min Neck-Supple Neck,No JVD,.  Lungs-improving air movement, no significant wheezing CV- S1, S2 normal, regular  Abd-  +ve B.Sounds, Abd Soft, No tenderness,    Extremity/Skin:- No  edema, pedal pulses present  Psych-affect is appropriate, oriented x3 Neuro-no new focal deficits, no tremors   Data Review   CBC w Diff:  Lab Results  Component Value Date   WBC 16.7 (H) 03/08/2023   HGB 10.2 (L) 03/08/2023   HGB 10.6 (L) 11/07/2014   HCT 31.3 (L) 03/08/2023   HCT 24.6 (L) 05/23/2021   PLT 181 03/08/2023   PLT 323 11/07/2014   LYMPHOPCT 8 03/07/2023   LYMPHOPCT 4.1 10/29/2013   BANDSPCT 1 10/09/2022   MONOPCT 10 03/07/2023   MONOPCT 4.4 10/29/2013   EOSPCT 0 03/07/2023   EOSPCT 0.0 10/29/2013   BASOPCT 0 03/07/2023   BASOPCT 0.2 10/29/2013   CMP:  Lab Results  Component Value Date   NA 124 (L) 03/10/2023   NA 139 11/07/2014   K 3.9 03/10/2023   K 3.6 11/07/2014   CL 80 (L) 03/10/2023   CL 102 11/07/2014   CO2 34 (H) 03/10/2023   CO2 29 11/07/2014   BUN 25 (H) 03/10/2023   BUN 6 (L) 11/07/2014   CREATININE 0.50 03/10/2023   CREATININE 0.65 11/07/2014   PROT 6.6 03/08/2023   PROT 6.9 10/18/2014   ALBUMIN 2.7 (L) 03/10/2023   ALBUMIN 3.9  10/18/2014   BILITOT 0.8 03/08/2023   BILITOT 0.3 10/18/2014   ALKPHOS 72 03/08/2023   ALKPHOS 98 10/18/2014   AST 18 03/08/2023   AST 24 10/18/2014   ALT 14 03/08/2023   ALT 19 10/18/2014   Total Discharge time is about 33 minutes  Shon Hale M.D on 03/10/2023 at 6:02 PM  Go to [email or web address] -  for contact info  [facility] - Office   [phone]

## 2023-03-10 NOTE — Progress Notes (Signed)
Transported to radiology for Xray

## 2023-03-12 ENCOUNTER — Inpatient Hospital Stay: Payer: 59 | Attending: Hematology

## 2023-03-12 ENCOUNTER — Ambulatory Visit (HOSPITAL_COMMUNITY): Payer: 59

## 2023-03-19 ENCOUNTER — Inpatient Hospital Stay: Payer: 59 | Admitting: Hematology

## 2023-03-21 ENCOUNTER — Other Ambulatory Visit: Payer: Self-pay

## 2023-03-21 ENCOUNTER — Inpatient Hospital Stay (HOSPITAL_COMMUNITY)
Admission: EM | Admit: 2023-03-21 | Discharge: 2023-04-19 | DRG: 208 | Disposition: E | Payer: 59 | Attending: Internal Medicine | Admitting: Internal Medicine

## 2023-03-21 ENCOUNTER — Encounter (HOSPITAL_COMMUNITY): Payer: Self-pay | Admitting: *Deleted

## 2023-03-21 ENCOUNTER — Ambulatory Visit (HOSPITAL_COMMUNITY): Payer: 59

## 2023-03-21 ENCOUNTER — Emergency Department (HOSPITAL_COMMUNITY): Payer: 59

## 2023-03-21 ENCOUNTER — Inpatient Hospital Stay: Payer: 59 | Attending: Hematology

## 2023-03-21 DIAGNOSIS — Z820 Family history of epilepsy and other diseases of the nervous system: Secondary | ICD-10-CM

## 2023-03-21 DIAGNOSIS — I739 Peripheral vascular disease, unspecified: Secondary | ICD-10-CM | POA: Diagnosis present

## 2023-03-21 DIAGNOSIS — C786 Secondary malignant neoplasm of retroperitoneum and peritoneum: Secondary | ICD-10-CM | POA: Diagnosis present

## 2023-03-21 DIAGNOSIS — G250 Essential tremor: Secondary | ICD-10-CM | POA: Diagnosis present

## 2023-03-21 DIAGNOSIS — J440 Chronic obstructive pulmonary disease with acute lower respiratory infection: Secondary | ICD-10-CM | POA: Diagnosis present

## 2023-03-21 DIAGNOSIS — D509 Iron deficiency anemia, unspecified: Secondary | ICD-10-CM | POA: Diagnosis present

## 2023-03-21 DIAGNOSIS — I10 Essential (primary) hypertension: Secondary | ICD-10-CM | POA: Diagnosis present

## 2023-03-21 DIAGNOSIS — R001 Bradycardia, unspecified: Secondary | ICD-10-CM | POA: Diagnosis present

## 2023-03-21 DIAGNOSIS — J9622 Acute and chronic respiratory failure with hypercapnia: Secondary | ICD-10-CM | POA: Diagnosis present

## 2023-03-21 DIAGNOSIS — J439 Emphysema, unspecified: Secondary | ICD-10-CM | POA: Diagnosis present

## 2023-03-21 DIAGNOSIS — R5381 Other malaise: Secondary | ICD-10-CM | POA: Diagnosis present

## 2023-03-21 DIAGNOSIS — I2489 Other forms of acute ischemic heart disease: Secondary | ICD-10-CM | POA: Diagnosis present

## 2023-03-21 DIAGNOSIS — I959 Hypotension, unspecified: Secondary | ICD-10-CM | POA: Diagnosis present

## 2023-03-21 DIAGNOSIS — Z5982 Transportation insecurity: Secondary | ICD-10-CM

## 2023-03-21 DIAGNOSIS — E871 Hypo-osmolality and hyponatremia: Secondary | ICD-10-CM | POA: Diagnosis present

## 2023-03-21 DIAGNOSIS — T18118A Gastric contents in esophagus causing other injury, initial encounter: Secondary | ICD-10-CM | POA: Diagnosis not present

## 2023-03-21 DIAGNOSIS — M549 Dorsalgia, unspecified: Secondary | ICD-10-CM | POA: Diagnosis present

## 2023-03-21 DIAGNOSIS — F419 Anxiety disorder, unspecified: Secondary | ICD-10-CM | POA: Diagnosis present

## 2023-03-21 DIAGNOSIS — R1110 Vomiting, unspecified: Secondary | ICD-10-CM | POA: Diagnosis not present

## 2023-03-21 DIAGNOSIS — J9621 Acute and chronic respiratory failure with hypoxia: Secondary | ICD-10-CM | POA: Diagnosis present

## 2023-03-21 DIAGNOSIS — Z8249 Family history of ischemic heart disease and other diseases of the circulatory system: Secondary | ICD-10-CM

## 2023-03-21 DIAGNOSIS — E869 Volume depletion, unspecified: Secondary | ICD-10-CM | POA: Diagnosis present

## 2023-03-21 DIAGNOSIS — Z79899 Other long term (current) drug therapy: Secondary | ICD-10-CM

## 2023-03-21 DIAGNOSIS — Z7722 Contact with and (suspected) exposure to environmental tobacco smoke (acute) (chronic): Secondary | ICD-10-CM | POA: Diagnosis present

## 2023-03-21 DIAGNOSIS — Z87891 Personal history of nicotine dependence: Secondary | ICD-10-CM

## 2023-03-21 DIAGNOSIS — D696 Thrombocytopenia, unspecified: Secondary | ICD-10-CM | POA: Diagnosis present

## 2023-03-21 DIAGNOSIS — J189 Pneumonia, unspecified organism: Secondary | ICD-10-CM | POA: Diagnosis present

## 2023-03-21 DIAGNOSIS — I700 Atherosclerosis of aorta: Secondary | ICD-10-CM | POA: Diagnosis present

## 2023-03-21 DIAGNOSIS — K2270 Barrett's esophagus without dysplasia: Secondary | ICD-10-CM | POA: Diagnosis present

## 2023-03-21 DIAGNOSIS — Z9981 Dependence on supplemental oxygen: Secondary | ICD-10-CM

## 2023-03-21 DIAGNOSIS — R5383 Other fatigue: Secondary | ICD-10-CM | POA: Diagnosis present

## 2023-03-21 DIAGNOSIS — E8809 Other disorders of plasma-protein metabolism, not elsewhere classified: Secondary | ICD-10-CM | POA: Diagnosis present

## 2023-03-21 DIAGNOSIS — C569 Malignant neoplasm of unspecified ovary: Secondary | ICD-10-CM | POA: Diagnosis present

## 2023-03-21 DIAGNOSIS — D649 Anemia, unspecified: Secondary | ICD-10-CM

## 2023-03-21 DIAGNOSIS — J441 Chronic obstructive pulmonary disease with (acute) exacerbation: Principal | ICD-10-CM | POA: Diagnosis present

## 2023-03-21 DIAGNOSIS — Z7969 Long term (current) use of other immunomodulators and immunosuppressants: Secondary | ICD-10-CM

## 2023-03-21 DIAGNOSIS — Z825 Family history of asthma and other chronic lower respiratory diseases: Secondary | ICD-10-CM

## 2023-03-21 DIAGNOSIS — K219 Gastro-esophageal reflux disease without esophagitis: Secondary | ICD-10-CM | POA: Diagnosis present

## 2023-03-21 DIAGNOSIS — Z95828 Presence of other vascular implants and grafts: Secondary | ICD-10-CM

## 2023-03-21 DIAGNOSIS — Z8701 Personal history of pneumonia (recurrent): Secondary | ICD-10-CM

## 2023-03-21 DIAGNOSIS — E782 Mixed hyperlipidemia: Secondary | ICD-10-CM | POA: Diagnosis present

## 2023-03-21 DIAGNOSIS — Z7951 Long term (current) use of inhaled steroids: Secondary | ICD-10-CM

## 2023-03-21 DIAGNOSIS — Z7982 Long term (current) use of aspirin: Secondary | ICD-10-CM

## 2023-03-21 DIAGNOSIS — M1990 Unspecified osteoarthritis, unspecified site: Secondary | ICD-10-CM | POA: Diagnosis present

## 2023-03-21 LAB — CBC WITH DIFFERENTIAL/PLATELET
Abs Immature Granulocytes: 0.23 10*3/uL — ABNORMAL HIGH (ref 0.00–0.07)
Basophils Absolute: 0 10*3/uL (ref 0.0–0.1)
Basophils Relative: 1 %
Eosinophils Absolute: 0.1 10*3/uL (ref 0.0–0.5)
Eosinophils Relative: 2 %
HCT: 21.7 % — ABNORMAL LOW (ref 36.0–46.0)
Hemoglobin: 6.8 g/dL — CL (ref 12.0–15.0)
Lymphocytes Relative: 18 %
Lymphs Abs: 0.9 10*3/uL (ref 0.7–4.0)
MCH: 27.4 pg (ref 26.0–34.0)
MCHC: 31.3 g/dL (ref 30.0–36.0)
MCV: 87.5 fL (ref 80.0–100.0)
Monocytes Absolute: 0.3 10*3/uL (ref 0.1–1.0)
Monocytes Relative: 6 %
Neutro Abs: 3.3 10*3/uL (ref 1.7–7.7)
Neutrophils Relative %: 68 %
Platelets: 206 10*3/uL (ref 150–400)
RBC: 2.48 MIL/uL — ABNORMAL LOW (ref 3.87–5.11)
RDW: 19.5 % — ABNORMAL HIGH (ref 11.5–15.5)
WBC: 4.8 10*3/uL (ref 4.0–10.5)
nRBC: 2.3 % — ABNORMAL HIGH (ref 0.0–0.2)

## 2023-03-21 LAB — LACTIC ACID, PLASMA
Lactic Acid, Venous: 1.8 mmol/L (ref 0.5–1.9)
Lactic Acid, Venous: 2.7 mmol/L (ref 0.5–1.9)

## 2023-03-21 LAB — HEMOGLOBIN AND HEMATOCRIT, BLOOD
HCT: 29.2 % — ABNORMAL LOW (ref 36.0–46.0)
Hemoglobin: 9.4 g/dL — ABNORMAL LOW (ref 12.0–15.0)

## 2023-03-21 LAB — PREPARE RBC (CROSSMATCH)

## 2023-03-21 LAB — COMPREHENSIVE METABOLIC PANEL
ALT: 15 U/L (ref 0–44)
AST: 25 U/L (ref 15–41)
Albumin: 2.7 g/dL — ABNORMAL LOW (ref 3.5–5.0)
Alkaline Phosphatase: 78 U/L (ref 38–126)
Anion gap: 10 (ref 5–15)
BUN: 7 mg/dL — ABNORMAL LOW (ref 8–23)
CO2: 31 mmol/L (ref 22–32)
Calcium: 8.4 mg/dL — ABNORMAL LOW (ref 8.9–10.3)
Chloride: 91 mmol/L — ABNORMAL LOW (ref 98–111)
Creatinine, Ser: 0.66 mg/dL (ref 0.44–1.00)
GFR, Estimated: >60 mL/min (ref >60)
Glucose, Bld: 104 mg/dL — ABNORMAL HIGH (ref 70–99)
Potassium: 3.8 mmol/L (ref 3.5–5.1)
Sodium: 132 mmol/L — ABNORMAL LOW (ref 135–145)
Total Bilirubin: 0.9 mg/dL (ref 0.3–1.2)
Total Protein: 5.7 g/dL — ABNORMAL LOW (ref 6.5–8.1)

## 2023-03-21 LAB — GLUCOSE, CAPILLARY: Glucose-Capillary: 180 mg/dL — ABNORMAL HIGH (ref 70–99)

## 2023-03-21 LAB — BLOOD GAS, VENOUS
Acid-Base Excess: 13.1 mmol/L — ABNORMAL HIGH (ref 0.0–2.0)
Bicarbonate: 38.9 mmol/L — ABNORMAL HIGH (ref 20.0–28.0)
Drawn by: 27160
O2 Saturation: 17.9 %
Patient temperature: 37.2
pCO2, Ven: 60 mmHg (ref 44–60)
pH, Ven: 7.42 (ref 7.25–7.43)
pO2, Ven: <31 mmHg — CL (ref 32–45)

## 2023-03-21 LAB — IRON AND TIBC
Iron: 17 ug/dL — ABNORMAL LOW (ref 28–170)
Saturation Ratios: 5 % — ABNORMAL LOW (ref 10.4–31.8)
TIBC: 312 ug/dL (ref 250–450)
UIBC: 295 ug/dL

## 2023-03-21 LAB — TYPE AND SCREEN: Unit division: 0

## 2023-03-21 LAB — PROCALCITONIN: Procalcitonin: <0.1 ng/mL

## 2023-03-21 LAB — POC OCCULT BLOOD, ED: Fecal Occult Bld: POSITIVE — AB

## 2023-03-21 LAB — TROPONIN I (HIGH SENSITIVITY)
Troponin I (High Sensitivity): 23 ng/L — ABNORMAL HIGH (ref <18)
Troponin I (High Sensitivity): 18 ng/L — ABNORMAL HIGH (ref <18)

## 2023-03-21 LAB — FERRITIN: Ferritin: 376 ng/mL — ABNORMAL HIGH (ref 11–307)

## 2023-03-21 LAB — BRAIN NATRIURETIC PEPTIDE: B Natriuretic Peptide: 46 pg/mL (ref 0.0–100.0)

## 2023-03-21 MED ORDER — PANTOPRAZOLE SODIUM 40 MG IV SOLR: 40.0000 mg | Freq: Two times a day (BID) | INTRAVENOUS | Status: DC

## 2023-03-21 MED ORDER — ONDANSETRON HCL 4 MG PO TABS: 4.0000 mg | ORAL_TABLET | Freq: Four times a day (QID) | ORAL | Status: DC | PRN

## 2023-03-21 MED ORDER — AMOXICILLIN-POT CLAVULANATE 875-125 MG PO TABS
1.0000 | ORAL_TABLET | Freq: Once | ORAL | Status: AC
  Administered 2023-03-21: 1 via ORAL
  Filled 2023-03-21: qty 1

## 2023-03-21 MED ORDER — PANTOPRAZOLE SODIUM 40 MG IV SOLR
40.0000 mg | Freq: Two times a day (BID) | INTRAVENOUS | Status: DC
  Administered 2023-03-21 – 2023-03-23 (×4): 40 mg via INTRAVENOUS
  Filled 2023-03-21 (×4): qty 10

## 2023-03-21 MED ORDER — FERROUS SULFATE 325 (65 FE) MG PO TABS
325.0000 mg | ORAL_TABLET | ORAL | Status: DC
  Administered 2023-03-21 – 2023-03-23 (×2): 325 mg via ORAL
  Filled 2023-03-21 (×2): qty 1

## 2023-03-21 MED ORDER — IPRATROPIUM-ALBUTEROL 0.5-2.5 (3) MG/3ML IN SOLN
9.0000 mL | Freq: Once | RESPIRATORY_TRACT | Status: AC
  Administered 2023-03-21: 9 mL via RESPIRATORY_TRACT
  Filled 2023-03-21: qty 3

## 2023-03-21 MED ORDER — GUAIFENESIN ER 600 MG PO TB12
600.0000 mg | ORAL_TABLET | Freq: Two times a day (BID) | ORAL | Status: DC
  Administered 2023-03-21 – 2023-03-22 (×4): 600 mg via ORAL
  Filled 2023-03-21 (×5): qty 1

## 2023-03-21 MED ORDER — ALBUTEROL SULFATE (2.5 MG/3ML) 0.083% IN NEBU
2.5000 mg | INHALATION_SOLUTION | Freq: Four times a day (QID) | RESPIRATORY_TRACT | Status: DC
  Administered 2023-03-21 – 2023-03-23 (×6): 2.5 mg via RESPIRATORY_TRACT
  Filled 2023-03-21 (×6): qty 3

## 2023-03-21 MED ORDER — LACTATED RINGERS IV BOLUS
1000.0000 mL | Freq: Once | INTRAVENOUS | Status: AC
  Administered 2023-03-21: 1000 mL via INTRAVENOUS

## 2023-03-21 MED ORDER — DOXYCYCLINE HYCLATE 100 MG PO TABS
100.0000 mg | ORAL_TABLET | Freq: Two times a day (BID) | ORAL | Status: DC
  Administered 2023-03-21 – 2023-03-23 (×5): 100 mg via ORAL
  Filled 2023-03-21 (×5): qty 1

## 2023-03-21 MED ORDER — METHYLPREDNISOLONE SODIUM SUCC 125 MG IJ SOLR
125.0000 mg | Freq: Once | INTRAMUSCULAR | Status: AC
  Administered 2023-03-21: 125 mg via INTRAVENOUS
  Filled 2023-03-21: qty 2

## 2023-03-21 MED ORDER — ACETAMINOPHEN 325 MG PO TABS
650.0000 mg | ORAL_TABLET | Freq: Four times a day (QID) | ORAL | Status: DC | PRN
  Administered 2023-03-22 – 2023-03-23 (×2): 650 mg via ORAL
  Filled 2023-03-21: qty 2

## 2023-03-21 MED ORDER — ALBUTEROL SULFATE (2.5 MG/3ML) 0.083% IN NEBU: 10.0000 mg/h | INHALATION_SOLUTION | Freq: Once | RESPIRATORY_TRACT | Status: DC

## 2023-03-21 MED ORDER — METHYLPREDNISOLONE SODIUM SUCC 125 MG IJ SOLR
60.0000 mg | Freq: Two times a day (BID) | INTRAMUSCULAR | Status: DC
  Administered 2023-03-21 – 2023-03-23 (×4): 60 mg via INTRAVENOUS
  Filled 2023-03-21 (×4): qty 2

## 2023-03-21 MED ORDER — PANTOPRAZOLE SODIUM 40 MG IV SOLR
40.0000 mg | Freq: Once | INTRAVENOUS | Status: AC
  Administered 2023-03-21: 40 mg via INTRAVENOUS
  Filled 2023-03-21: qty 10

## 2023-03-21 MED ORDER — ACETAMINOPHEN 650 MG RE SUPP: 650.0000 mg | Freq: Four times a day (QID) | RECTAL | Status: DC | PRN

## 2023-03-21 MED ORDER — ONDANSETRON HCL 4 MG/2ML IJ SOLN: 4.0000 mg | Freq: Four times a day (QID) | INTRAMUSCULAR | Status: DC | PRN

## 2023-03-21 MED ORDER — DOCUSATE SODIUM 100 MG PO CAPS
100.0000 mg | ORAL_CAPSULE | Freq: Two times a day (BID) | ORAL | Status: DC
  Administered 2023-03-21 – 2023-03-23 (×4): 100 mg via ORAL
  Filled 2023-03-21 (×4): qty 1

## 2023-03-21 MED ORDER — MAGNESIUM SULFATE 2 GM/50ML IV SOLN
2.0000 g | Freq: Once | INTRAVENOUS | Status: AC
  Administered 2023-03-21: 2 g via INTRAVENOUS
  Filled 2023-03-21: qty 50

## 2023-03-21 MED ORDER — SODIUM CHLORIDE 0.9 % IV SOLN: INTRAVENOUS | Status: DC

## 2023-03-21 MED ORDER — SODIUM CHLORIDE 0.9% IV SOLUTION: Freq: Once | INTRAVENOUS | Status: AC

## 2023-03-21 MED ORDER — SODIUM CHLORIDE 0.9 % IV SOLN
1.0000 g | INTRAVENOUS | Status: DC
  Administered 2023-03-21 – 2023-03-22 (×2): 1 g via INTRAVENOUS
  Filled 2023-03-21 (×2): qty 10

## 2023-03-21 NOTE — ED Triage Notes (Signed)
Pt BIB [facility] For c/o sob that has gotten progressively worse over the last couple of days  Pt denies any pain  Pt gave herself a neb treatment prior to ems arrival and ems gave another treatment en route; pt states it helped with her breathing   BP 157/74 P 99  O2 @ 94% on NRB with ems pt is on 4L of O2 all the time

## 2023-03-21 NOTE — ED Provider Notes (Signed)
[place] EMERGENCY DEPARTMENT AT [facility] Provider Note  CSN: [id number] Arrival date & time: 03/28/2023 1914  Chief Complaint(s) Shortness of Breath  HPI [name] is a 67 y.o. female with PMH COPD on 4 L home nasal cannula, ovarian cancer on niraparib, HTN, HLD, Barrett's esophagus who presents emergency room for evaluation of shortness of breath.  Patient recently discharged on 4/22 for COPD exacerbation.  Patient states that over the last 2 to 3 days her shortness of breath has gotten progressively worse.  She used a nebulizer treatment at home and received 1 nebulized treatment by EMS.  Patient arrives hemodynamically stable with minimal work of breathing but significant wheezing.  Denies chest pain, abdominal pain, nausea, vomiting, headache, fever or other systemic symptoms   Past Medical History Past Medical History:  Diagnosis Date   Anemia    Aortic atherosclerosis (HCC)    Arthritis    Asthma    Bilateral carotid artery stenosis    Cancer (HCC)    Gastric Cancer   COPD (chronic obstructive pulmonary disease) (HCC)    High cholesterol    Hyperlipidemia    Hypertension    Neuropathy    Osteoarthritis    Ovarian cancer (HCC)    Oxygen dependent    Peripheral vascular disease (HCC)    Peritoneal carcinoma (HCC)    Pneumonia 2019   Port-A-Cath in place 07/03/2020   Pulmonary emphysema (HCC)    Patient Active Problem List   Diagnosis Date Noted   COPD with acute exacerbation (HCC) 03/08/2023   Acute exacerbation of chronic obstructive pulmonary disease (COPD) (HCC) 03/07/2023   Elevated brain natriuretic peptide (BNP) level 03/07/2023   Leukocytosis 03/07/2023   COPD (chronic obstructive pulmonary disease) (HCC) 03/04/2023   Sepsis due to pneumonia (HCC) 10/29/2022   Dyslipidemia 10/29/2022   Essential tremor 10/29/2022   GERD without esophagitis 10/29/2022   Peripheral neuropathy 10/29/2022   RSV (respiratory syncytial virus pneumonia) 10/29/2022    Symptomatic anemia 10/03/2022   Macrocytic anemia 10/03/2022   Pancytopenia (HCC) 10/03/2022   Thrush 09/11/2022   Odynophagia 08/26/2022   COPD exacerbation (HCC) 07/31/2022   GERD (gastroesophageal reflux disease) 07/15/2022   ABLA (acute blood loss anemia)    Esophagitis 06/28/2022   Hematochezia 05/03/2022   E coli bacteremia 03/11/2022   Hypokalemia 03/11/2022   Hyponatremia 03/10/2022   Thrombocytopenia (HCC) 03/10/2022   Acute on chronic respiratory failure with hypoxia and hypercapnia (HCC) 12/30/2021   Deficiency anemia 05/23/2021   Peripheral neuropathy due to chemotherapy (HCC) 05/23/2021   Sepsis due to undetermined organism (HCC) 12/18/2020   Lobar pneumonia (HCC) 12/18/2020   Syncope 12/17/2020   Genetic testing 08/01/2020   Ovarian cancer (HCC) 07/07/2020   Port-A-Cath in place 07/03/2020   Family history of prostate cancer 07/03/2020   Goals of care, counseling/discussion 07/01/2020   Peritoneal carcinomatosis (HCC) 06/06/2020   Pneumonia 09/13/2018   Acute on chronic respiratory failure (HCC) 01/22/2018   CAP (community acquired pneumonia) 01/18/2016   Iron deficiency anemia 11/07/2015   Chronic respiratory failure with hypoxia (HCC) 11/07/2015   Acute on chronic respiratory failure with hypoxia (HCC) 11/07/2015   Mixed hyperlipidemia 11/07/2015   COPD GOLD 4/ bronchiectasis/02 dep 11/06/2015   Essential hypertension 11/06/2015   Home Medication(s) Prior to Admission medications   Medication Sig Start Date End Date Taking? Authorizing Provider  acetaminophen (TYLENOL) 325 MG tablet Take 2 tablets (650 mg total) by mouth every 6 (six) hours as needed for mild pain or fever (  or Fever >/= 101). 03/10/23   Emokpae, Courage, MD  albuterol (VENTOLIN HFA) 108 (90 Base) MCG/ACT inhaler Inhale 2 puffs into the lungs every 6 (six) hours as needed for wheezing or shortness of breath. 03/10/23   Mariea Clonts, Courage, MD  ALPRAZolam (XANAX) 0.25 MG tablet Take 1 tablet (0.25  mg total) by mouth 3 (three) times daily as needed for anxiety. 03/10/23   Shon Hale, MD  amLODipine (NORVASC) 10 MG tablet Take 1 tablet (10 mg total) by mouth daily. 03/10/23 03/09/24  Shon Hale, MD  aspirin EC 81 MG tablet Take 1 tablet (81 mg total) by mouth daily with breakfast. 07/01/22   Shon Hale, MD  atorvastatin (LIPITOR) 40 MG tablet Take 1 tablet (40 mg total) by mouth daily. 01/02/22   Noralee Stain, DO  Budeson-Glycopyrrol-Formoterol (BREZTRI AEROSPHERE) 160-9-4.8 MCG/ACT AERO Inhale 2 puffs into the lungs 2 (two) times daily. 03/10/23   Shon Hale, MD  ferrous sulfate 325 (65 FE) MG tablet Take 1 tablet (325 mg total) by mouth every other day. 03/10/23   Shon Hale, MD  guaiFENesin-dextromethorphan (ROBITUSSIN DM) 100-10 MG/5ML syrup Take 5 mLs by mouth every 4 (four) hours as needed for cough. 03/10/23   Shon Hale, MD  ipratropium (ATROVENT HFA) 17 MCG/ACT inhaler Inhale 2 puffs into the lungs every 6 (six) hours as needed for wheezing. 03/10/23   Mariea Clonts, Courage, MD  ipratropium-albuterol (DUONEB) 0.5-2.5 (3) MG/3ML SOLN Take 3 mLs by nebulization every 6 (six) hours as needed. 03/10/23   Shon Hale, MD  levalbuterol (XOPENEX) 1.25 MG/3ML nebulizer solution Take 1.25 mg by nebulization in the morning, at noon, and at bedtime. 03/10/23   Shon Hale, MD  magnesium oxide (MAG-OX) 400 (240 Mg) MG tablet Take 1 tablet (400 mg total) by mouth 2 (two) times daily. 09/06/22   Doreatha Massed, MD  niraparib tosylate (ZEJULA) 100 MG tablet Take 1 tablet (100 mg total) by mouth daily. May take at bedtime to reduce nausea and vomiting. 12/02/22   Doreatha Massed, MD  nortriptyline (PAMELOR) 25 MG capsule Take 25 mg by mouth at bedtime.    [provider]  OXYGEN Inhale 3 L into the lungs continuous.    [provider]  pantoprazole (PROTONIX) 40 MG tablet Take 1 tablet (40 mg total) by mouth 2 (two) times daily. Patient taking  differently: Take 40 mg by mouth daily. 07/01/22 03/07/23  Shon Hale, MD  potassium chloride (KLOR-CON M) 10 MEQ tablet Take 10 mEq by mouth daily. 03/03/23   [provider]  primidone (MYSOLINE) 50 MG tablet Take 50 mg by mouth at bedtime.    [provider]                                                                                                                                    Past Surgical History Past Surgical History:  Procedure Laterality Date   BIOPSY  06/30/2022  Procedure: BIOPSY;  Surgeon: Lanelle Bal, DO;  [facility];  Service: Endoscopy;;  gastric   BIOPSY  09/03/2022   Procedure: BIOPSY;  Surgeon: Dolores Frame, MD;  [facility]: [facility];  Service: Gastroenterology;;   COLONOSCOPY WITH PROPOFOL N/A 09/03/2022   Procedure: COLONOSCOPY WITH PROPOFOL;  Surgeon: Dolores Frame, MD;  [facility]: [facility];  Service: Gastroenterology;  Laterality: N/A;  900 ASA 3   ESOPHAGEAL BRUSHING  09/03/2022   Procedure: ESOPHAGEAL BRUSHING;  Surgeon: Marguerita Merles, Reuel Boom, MD;  Location: [facility];  Service: Gastroenterology;;   ESOPHAGOGASTRODUODENOSCOPY (EGD) WITH PROPOFOL N/A 02/17/2021   Procedure: ESOPHAGOGASTRODUODENOSCOPY (EGD) WITH PROPOFOL;  Surgeon: Dolores Frame, MD;  [facility];  Service: Gastroenterology;  Laterality: N/A;   ESOPHAGOGASTRODUODENOSCOPY (EGD) WITH PROPOFOL N/A 05/04/2022   Procedure: ESOPHAGOGASTRODUODENOSCOPY (EGD) WITH PROPOFOL;  Surgeon: Corbin Ade, MD;  [facility];  Service: Endoscopy;  Laterality: N/A;   ESOPHAGOGASTRODUODENOSCOPY (EGD) WITH PROPOFOL N/A 06/30/2022   Procedure: ESOPHAGOGASTRODUODENOSCOPY (EGD) WITH PROPOFOL;  Surgeon: Lanelle Bal, DO;  [facility];  Service: Endoscopy;  Laterality: N/A;   ESOPHAGOGASTRODUODENOSCOPY (EGD) WITH PROPOFOL N/A 09/03/2022   Procedure: ESOPHAGOGASTRODUODENOSCOPY (EGD) WITH  PROPOFOL;  Surgeon: Dolores Frame, MD;  [facility];  Service: Gastroenterology;  Laterality: N/A;   HALLUX VALGUS BASE WEDGE Right 06/09/2015   Procedure: Base wedge osteotomy with modified McBride right foot ;  Surgeon: Linus Galas, MD;  Location: [facility];  Service: Podiatry;  Laterality: Right;   PORTACATH PLACEMENT Left 06/28/2020   Procedure: PORT-A-CATHETER PLACEMENT LEFT CHEST (attached catheter in left subclavian);  Surgeon: Lucretia Roers, MD;  Location: [facility];  Service: General;  Laterality: Left;   TUBAL LIGATION     VIDEO BRONCHOSCOPY WITH ENDOBRONCHIAL NAVIGATION N/A 03/09/2021   Procedure: VIDEO BRONCHOSCOPY WITH ENDOBRONCHIAL NAVIGATION;  Surgeon: Vida Rigger, MD;  Location: [facility];  Service: Thoracic;  Laterality: N/A;   VIDEO BRONCHOSCOPY WITH ENDOBRONCHIAL ULTRASOUND N/A 03/09/2021   Procedure: VIDEO BRONCHOSCOPY WITH ENDOBRONCHIAL ULTRASOUND;  Surgeon: Vida Rigger, MD;  Location: [facility];  Service: Thoracic;  Laterality: N/A;   Family History Family History  Problem Relation Age of Onset   Alzheimer's disease Mother    COPD Father    Emphysema Father    Hypertension Father    Healthy Sister    Healthy Brother    Alzheimer's disease Maternal Grandmother    Healthy Sister    Healthy Sister    Healthy Sister    Prostate cancer Other        paternal grandmother's brother; dx in early 62s   Breast cancer Neg Hx     Social History Social History   Tobacco Use   Smoking status: Former    Packs/day: 2.00    Years: 30.00    Additional pack years: 0.00    Total pack years: 60.00    Types: Cigarettes    Quit date: 11/04/2013    Years since quitting: 9.3   Smokeless tobacco: Never  Vaping Use   Vaping Use: Never used  Substance Use Topics   Alcohol use: No   Drug use: No   Allergies Patient has no known allergies.  Review of Systems Review of Systems  Constitutional:  Positive for fatigue.  Respiratory:  Positive for  shortness of breath and wheezing.     Physical Exam Vital Signs  I have reviewed the triage vital signs BP 112/61   Pulse 96  Temp 98.9 F (37.2 C) (Oral)   Resp (!) 24   Ht 5' (1.524 m)   Wt 51.1 kg   SpO2 91%   BMI 22.00 kg/m   Physical Exam Vitals and nursing note reviewed.  Constitutional:      General: She is not in acute distress.    Appearance: She is well-developed.  HENT:     Head: Normocephalic and atraumatic.  Eyes:     Conjunctiva/sclera: Conjunctivae normal.  Cardiovascular:     Rate and Rhythm: Normal rate and regular rhythm.     Heart sounds: No murmur heard. Pulmonary:     Effort: Pulmonary effort is normal. No respiratory distress.     Breath sounds: Wheezing present.  Abdominal:     Palpations: Abdomen is soft.     Tenderness: There is no abdominal tenderness.  Musculoskeletal:        General: No swelling.     Cervical back: Neck supple.  Skin:    General: Skin is warm and dry.     Capillary Refill: Capillary refill takes less than 2 seconds.     Coloration: Skin is pale.  Neurological:     Mental Status: She is alert.  Psychiatric:        Mood and Affect: Mood normal.     ED Results and Treatments Labs (all labs ordered are listed, but only abnormal results are displayed) Labs Reviewed  COMPREHENSIVE METABOLIC PANEL - Abnormal; Notable for the following components:      Result Value   Sodium 132 (*)    Chloride 91 (*)    Glucose, Bld 104 (*)    BUN 7 (*)    Calcium 8.4 (*)    Total Protein 5.7 (*)    Albumin 2.7 (*)    All other components within normal limits  CBC WITH DIFFERENTIAL/PLATELET - Abnormal; Notable for the following components:   RBC 2.48 (*)    Hemoglobin 6.8 (*)    HCT 21.7 (*)    RDW 19.5 (*)    nRBC 2.3 (*)    Abs Immature Granulocytes 0.23 (*)    All other components within normal limits  BLOOD GAS, VENOUS - Abnormal; Notable for the following components:   pO2, Ven <31 (*)    Bicarbonate 38.9 (*)     Acid-Base Excess 13.1 (*)    All other components within normal limits  POC OCCULT BLOOD, ED - Abnormal; Notable for the following components:   Fecal Occult Bld POSITIVE (*)    All other components within normal limits  TROPONIN I (HIGH SENSITIVITY) - Abnormal; Notable for the following components:   Troponin I (High Sensitivity) 23 (*)    All other components within normal limits  BRAIN NATRIURETIC PEPTIDE  LACTIC ACID, PLASMA  LACTIC ACID, PLASMA  IRON AND TIBC  FERRITIN  PREPARE RBC (CROSSMATCH)  TYPE AND SCREEN  TROPONIN I (HIGH SENSITIVITY)  Radiology DG Chest Portable 1 View  Result Date: 03/20/2023 CLINICAL DATA:  Dyspnea EXAM: PORTABLE CHEST 1 VIEW COMPARISON:  03/10/2023 FINDINGS: Calcified aorta. Normal cardiopericardial silhouette. Film is rotated. Stable interstitial changes and chronic lung changes. No pneumothorax or effusion. Subtle left lung opacity. Overlapping cardiac leads. Stable left subclavian chest port. Chronic appearing left-sided rib deformities IMPRESSION: Chronic lung changes. However new subtle opacity at the left lung base. Possible infiltrate. Recommend follow-up Electronically Signed   By: Karen Kays M.D.   On: 04/10/2023 09:46    Pertinent labs & imaging results that were available during my care of the patient were reviewed by me and considered in my medical decision making (see MDM for details).  Medications Ordered in ED Medications  0.9 %  sodium chloride infusion (Manually program via Guardrails IV Fluids) (has no administration in time range)  albuterol (PROVENTIL) (2.5 MG/3ML) 0.083% nebulizer solution (has no administration in time range)  amoxicillin-clavulanate (AUGMENTIN) 875-125 MG per tablet 1 tablet (has no administration in time range)  ipratropium-albuterol (DUONEB) 0.5-2.5 (3) MG/3ML nebulizer solution 9 mL (9 mLs  Nebulization Given 03/26/2023 0947)  methylPREDNISolone sodium succinate (SOLU-MEDROL) 125 mg/2 mL injection 125 mg (125 mg Intravenous Given 03/24/2023 0943)  magnesium sulfate IVPB 2 g 50 mL (0 g Intravenous Stopped 04/05/2023 1048)  lactated ringers bolus 1,000 mL (1,000 mLs Intravenous New Bag/Given 04/16/2023 0948)  pantoprazole (PROTONIX) injection 40 mg (40 mg Intravenous Given 03/26/2023 1058)                                                                                                                                     Procedures .Critical Care  Performed by: Glendora Score, MD Authorized by: Glendora Score, MD   Critical care provider statement:    Critical care time (minutes):  30   Critical care was necessary to treat or prevent imminent or life-threatening deterioration of the following conditions:  Respiratory failure (critical anemia requiring blood transfusion)   Critical care was time spent personally by me on the following activities:  Development of treatment plan with patient or surrogate, discussions with consultants, evaluation of patient's response to treatment, examination of patient, ordering and review of laboratory studies, ordering and review of radiographic studies, ordering and performing treatments and interventions, pulse oximetry, re-evaluation of patient's condition and review of old charts   (including critical care time)  Medical Decision Making / ED Course   This patient presents to the ED for concern of shortness of breath, fatigue, this involves an extensive number of treatment options, and is a complaint that carries with it a high risk of complications and morbidity.  The differential diagnosis includes Pe, PTX, Pulmonary Edema, ARDS, COPD/Asthma, ACS, CHF exacerbation, Arrhythmia, Pericardial Effusion/Tamponade, Anemia, Sepsis, Acidosis/Hypercapnia, Anxiety, Viral URI  MDM: Patient seen emergency room for evaluation of shortness of breath.  Physical exam reveals a  pale appearing patient with significant expiratory wheezing bilaterally  but no significant work of breathing.  No accessory muscle use.  Laboratory evaluation with a new hemoglobin drop from 10.22 weeks ago to 6.8 today.  MCV normal at 87.5 laboratory valuation otherwise largely unremarkable outside of mild hypoalbuminemia to 2.7.  High-sensitivity troponin 23 likely elevated in setting of demand ischemia.  BNP is normal.  Treatments packed red blood cells ordered for the patient.  Chest x-ray with a possible CT left infiltrate and oral antibiotics initiated.  Fecal occult blood is positive but unreliable in the setting of her current iron use.  I spoke with Dr. Levon Hedger of gastroenterology and they will come evaluate the patient.  Patient require hospital admission for symptomatic anemia and COPD exacerbation.  Patient then admitted.   Additional history obtained:  -External records from outside source obtained and reviewed including: Chart review including previous notes, labs, imaging, consultation notes   Lab Tests: -I ordered, reviewed, and interpreted labs.   The pertinent results include:   Labs Reviewed  COMPREHENSIVE METABOLIC PANEL - Abnormal; Notable for the following components:      Result Value   Sodium 132 (*)    Chloride 91 (*)    Glucose, Bld 104 (*)    BUN 7 (*)    Calcium 8.4 (*)    Total Protein 5.7 (*)    Albumin 2.7 (*)    All other components within normal limits  CBC WITH DIFFERENTIAL/PLATELET - Abnormal; Notable for the following components:   RBC 2.48 (*)    Hemoglobin 6.8 (*)    HCT 21.7 (*)    RDW 19.5 (*)    nRBC 2.3 (*)    Abs Immature Granulocytes 0.23 (*)    All other components within normal limits  BLOOD GAS, VENOUS - Abnormal; Notable for the following components:   pO2, Ven <31 (*)    Bicarbonate 38.9 (*)    Acid-Base Excess 13.1 (*)    All other components within normal limits  POC OCCULT BLOOD, ED - Abnormal; Notable for the following  components:   Fecal Occult Bld POSITIVE (*)    All other components within normal limits  TROPONIN I (HIGH SENSITIVITY) - Abnormal; Notable for the following components:   Troponin I (High Sensitivity) 23 (*)    All other components within normal limits  BRAIN NATRIURETIC PEPTIDE  LACTIC ACID, PLASMA  LACTIC ACID, PLASMA  IRON AND TIBC  FERRITIN  PREPARE RBC (CROSSMATCH)  TYPE AND SCREEN  TROPONIN I (HIGH SENSITIVITY)      EKG   EKG Interpretation  Date/Time:  Friday Mar 21 2023 09:18:32 EDT Ventricular Rate:  115 PR Interval:  156 QRS Duration: 77 QT Interval:  317 QTC Calculation: 417 R Axis:   39 Text Interpretation: Sinus tachycardia Atrial premature complexes Confirmed by Tanav Orsak ([id number]) on 04/12/2023 9:35:33 AM         Imaging Studies ordered: I ordered imaging studies including CXR I independently visualized and interpreted imaging. I agree with the radiologist interpretation   Medicines ordered and prescription drug management: Meds ordered this encounter  Medications   ipratropium-albuterol (DUONEB) 0.5-2.5 (3) MG/3ML nebulizer solution 9 mL   methylPREDNISolone sodium succinate (SOLU-MEDROL) 125 mg/2 mL injection 125 mg   magnesium sulfate IVPB 2 g 50 mL   lactated ringers bolus 1,000 mL   0.9 %  sodium chloride infusion (Manually program via Guardrails IV Fluids)   pantoprazole (PROTONIX) injection 40 mg   albuterol (PROVENTIL) (2.5 MG/3ML) 0.083% nebulizer solution   amoxicillin-clavulanate (AUGMENTIN) 875-125  MG per tablet 1 tablet    -I have reviewed the patients home medicines and have made adjustments as needed  Critical interventions Multiple DuoNebs, steroids, blood transfusion  Consultations Obtained: I requested consultation with the gastroenterologist on-call Dr. Levon Hedger,  and discussed lab and imaging findings as well as pertinent plan - they recommend: Recommendations pending   Cardiac Monitoring: The patient was maintained on  a cardiac monitor.  I personally viewed and interpreted the cardiac monitored which showed an underlying rhythm of: NSR  Social Determinants of Health:  Factors impacting patients care include: none   Reevaluation: After the interventions noted above, I reevaluated the patient and found that they have :improved  Co morbidities that complicate the patient evaluation  Past Medical History:  Diagnosis Date   Anemia    Aortic atherosclerosis (HCC)    Arthritis    Asthma    Bilateral carotid artery stenosis    Cancer (HCC)    Gastric Cancer   COPD (chronic obstructive pulmonary disease) (HCC)    High cholesterol    Hyperlipidemia    Hypertension    Neuropathy    Osteoarthritis    Ovarian cancer (HCC)    Oxygen dependent    Peripheral vascular disease (HCC)    Peritoneal carcinoma (HCC)    Pneumonia 2019   Port-A-Cath in place 07/03/2020   Pulmonary emphysema (HCC)       Dispostion: I considered admission for this patient, and due to symptomatic anemia with COPD exacerbation patient require hospital admission     Final Clinical Impression(s) / ED Diagnoses Final diagnoses:  None     @PCDICTATION @    Glendora Score, MD 03/29/2023 1115

## 2023-03-21 NOTE — TOC Initial Note (Signed)
Transition of Care [facility]) - Initial/Assessment Note    Patient Details  Name: [name] MRN: [id number] Date of Birth: 18-Apr-1956  Transition of Care [facility]) CM/SW Contact:    Villa Herb, LCSWA Phone Number: 03/26/2023, 1:17 PM  Clinical Narrative:                 Pt high risk for readmission. Pt is well known to TOC. Pt recently D/C on 4/22. Pt from home with significant other and daughter. Pt is independent in completing ADLs and has transportation when needed. Pt has a walker and home O2 baseline 4L. Pt discharged 4/22 with [facility] PT services. TOC to follow.   Expected Discharge Plan: Home w Home Health Services Barriers to Discharge: Continued Medical Work up   Patient Goals and CMS Choice Patient states their goals for this hospitalization and ongoing recovery are:: return home CMS Medicare.gov Compare Post Acute Care list provided to:: Patient Represenative (must comment) Choice offered to / list presented to : Adult Children      Expected Discharge Plan and Services In-house Referral: Clinical Social Work Discharge Planning Services: CM Consult Post Acute Care Choice: Home Health Living arrangements for the past 2 months: Single Family Home                                      Prior Living Arrangements/Services Living arrangements for the past 2 months: Single Family Home Lives with:: Adult Children, Significant Other Patient language and need for interpreter reviewed:: Yes Do you feel safe going back to the place where you live?: Yes      Need for Family Participation in Patient Care: Yes (Comment) Care giver support system in place?: Yes (comment) Current home services: DME    Activities of Daily Living      Permission Sought/Granted                  Emotional Assessment Appearance:: Appears stated age       Alcohol / Substance Use: Not Applicable Psych Involvement: No (comment)  Admission diagnosis:  COPD with acute  exacerbation (HCC) [J44.1] Patient Active Problem List   Diagnosis Date Noted   COPD with acute exacerbation (HCC) 03/08/2023   Acute exacerbation of chronic obstructive pulmonary disease (COPD) (HCC) 03/07/2023   Elevated brain natriuretic peptide (BNP) level 03/07/2023   Leukocytosis 03/07/2023   COPD (chronic obstructive pulmonary disease) (HCC) 03/04/2023   Sepsis due to pneumonia (HCC) 10/29/2022   Dyslipidemia 10/29/2022   Essential tremor 10/29/2022   GERD without esophagitis 10/29/2022   Peripheral neuropathy 10/29/2022   RSV (respiratory syncytial virus pneumonia) 10/29/2022   Symptomatic anemia 10/03/2022   Macrocytic anemia 10/03/2022   Pancytopenia (HCC) 10/03/2022   Thrush 09/11/2022   Odynophagia 08/26/2022   COPD exacerbation (HCC) 07/31/2022   GERD (gastroesophageal reflux disease) 07/15/2022   ABLA (acute blood loss anemia)    Esophagitis 06/28/2022   Hematochezia 05/03/2022   E coli bacteremia 03/11/2022   Hypokalemia 03/11/2022   Hyponatremia 03/10/2022   Thrombocytopenia (HCC) 03/10/2022   Acute on chronic respiratory failure with hypoxia and hypercapnia (HCC) 12/30/2021   Deficiency anemia 05/23/2021   Peripheral neuropathy due to chemotherapy (HCC) 05/23/2021   Sepsis due to undetermined organism (HCC) 12/18/2020   Lobar pneumonia (HCC) 12/18/2020   Syncope 12/17/2020   Genetic testing 08/01/2020   Ovarian cancer (HCC) 07/07/2020   Port-A-Cath in place 07/03/2020  Family history of prostate cancer 07/03/2020   Goals of care, counseling/discussion 07/01/2020   Peritoneal carcinomatosis (HCC) 06/06/2020   Pneumonia 09/13/2018   Acute on chronic respiratory failure (HCC) 01/22/2018   CAP (community acquired pneumonia) 01/18/2016   Iron deficiency anemia 11/07/2015   Chronic respiratory failure with hypoxia (HCC) 11/07/2015   Acute on chronic respiratory failure with hypoxia (HCC) 11/07/2015   Mixed hyperlipidemia 11/07/2015   COPD GOLD 4/  bronchiectasis/02 dep 11/06/2015   Essential hypertension 11/06/2015   PCP:  Abram Sander, MD Pharmacy:   [facility] [address] [place] - [address] [place] [address] Phone: [phone] Fax: [phone]   - [facility] [address] [place] [address] Phone: [phone] Fax: [phone]     Social Determinants of Health (SDOH) Social History: SDOH Screenings   Food Insecurity: No Food Insecurity (03/07/2023)  Housing: Medium Risk (03/07/2023)  Transportation Needs: Unmet Transportation Needs (03/07/2023)  Utilities: Not At Risk (03/07/2023)  Alcohol Screen: Low Risk  (09/27/2020)  Depression (PHQ2-9): Low Risk  (09/27/2020)  Financial Resource Strain: Low Risk  (09/27/2020)  Physical Activity: Inactive (09/27/2020)  Social Connections: Socially Isolated (09/27/2020)  Stress: No Stress Concern Present (09/27/2020)  Tobacco Use: Medium Risk (04/14/2023)   SDOH Interventions:     Readmission Risk Interventions    14-Apr-2023   12:51 PM 03/05/2023    3:44 PM 01/03/2023   12:44 PM  Readmission Risk Prevention Plan  Transportation Screening Complete Complete Complete  Medication Review Oceanographer) Complete Complete Complete  PCP or Specialist appointment within 3-5 days of discharge  Not Complete   HRI or Home Care Consult Complete Complete Complete  SW Recovery Care/Counseling Consult Complete Complete Complete  Palliative Care Screening Not Applicable Complete Not Applicable  Skilled Nursing Facility Not Applicable Not Applicable Not Applicable

## 2023-03-21 NOTE — H&P (Addendum)
History and Physical    [name] ZOX:[id number] DOB: Apr 14, 1956 DOA: 04/18/2023  PCP: Abram Sander, MD   Patient coming from: Home  I have personally briefly reviewed patient's old medical records in [facility].  Chief Complaint: Shortness of breath and generalized weakness.  HPI: [name] is a 67 y.o. female with PMH significant for COPD on 4 L of supplemental oxygen at baseline, ovarian cancer on chemotherapy, hypertension, hyperlipidemia, Barrett's esophagus presented in the ED for the evaluation of generalized weakness and shortness of breath.  Patient was recently discharged on 4/22 after hospitalization for COPD exacerbation.  Patient reports she has intermittent shortness of breath but for the last 2 to 3 days she has felt very short of breath which has been getting worse.  She used her nebulizer treatment without any improvement.  Patient denies any chest pain, dizziness, palpitations, nausea, vomiting, diarrhea, headache, fever, sick contacts, recent travel.  ED Course: She was hypotensive, bradycardic, tachypneic and hypoxic. HR 58, RR 25, BP 81/52, SpO2 88% on 4 L of supplemental oxygen. Labs include sodium 132, potassium 3.9, chloride 91, bicarb 31, glucose 107, BUN 7, creatinine 0.66, calcium 8.4, alkaline phosphatase 78, albumin 2.7, AST 25, ALT 15, total protein 5.7, total bilirubin 0.9, BNP 46, troponin 23, WBC 4.8, hemoglobin 6.8, hematocrit 21.7, MCV 87.5, platelet 206, Chest x-ray shows chronic lung changes, subtle opacity at left lung possible infiltrate.   Review of Systems: Review of Systems  Constitutional:  Positive for malaise/fatigue.  HENT:  Positive for congestion.   Eyes: Negative.   Respiratory:  Positive for cough, shortness of breath and wheezing.   Cardiovascular: Negative.   Gastrointestinal: Negative.   Genitourinary: Negative.   Musculoskeletal: Negative.   Skin: Negative.   Neurological:  Positive for weakness.   Endo/Heme/Allergies: Negative.   Psychiatric/Behavioral: Negative.        Past Medical History:  Diagnosis Date   Anemia    Aortic atherosclerosis (HCC)    Arthritis    Asthma    Bilateral carotid artery stenosis    Cancer (HCC)    Gastric Cancer   COPD (chronic obstructive pulmonary disease) (HCC)    High cholesterol    Hyperlipidemia    Hypertension    Neuropathy    Osteoarthritis    Ovarian cancer (HCC)    Oxygen dependent    Peripheral vascular disease (HCC)    Peritoneal carcinoma (HCC)    Pneumonia 2019   Port-A-Cath in place 07/03/2020   Pulmonary emphysema [facility])     Past Surgical History:  Procedure Laterality Date   BIOPSY  06/30/2022   Procedure: BIOPSY;  Surgeon: Lanelle Bal, DO;  [facility];  Service: Endoscopy;;  gastric   BIOPSY  09/03/2022   Procedure: BIOPSY;  Surgeon: Dolores Frame, MD;  [facility];  Service: Gastroenterology;;   COLONOSCOPY WITH PROPOFOL N/A 09/03/2022   Procedure: COLONOSCOPY WITH PROPOFOL;  Surgeon: Dolores Frame, MD;  [facility];  Service: Gastroenterology;  Laterality: N/A;  900 ASA 3   ESOPHAGEAL BRUSHING  09/03/2022   Procedure: ESOPHAGEAL BRUSHING;  Surgeon: Marguerita Merles, Reuel Boom, MD;  [facility];  Service: Gastroenterology;;   ESOPHAGOGASTRODUODENOSCOPY (EGD) WITH PROPOFOL N/A 02/17/2021   Procedure: ESOPHAGOGASTRODUODENOSCOPY (EGD) WITH PROPOFOL;  Surgeon: Dolores Frame, MD;  [facility];  Service: Gastroenterology;  Laterality: N/A;   ESOPHAGOGASTRODUODENOSCOPY (EGD) WITH PROPOFOL N/A 05/04/2022   Procedure: ESOPHAGOGASTRODUODENOSCOPY (EGD) WITH PROPOFOL;  Surgeon: Jena Gauss,  Gerrit Friends, MD;  [facility];  Service: Endoscopy;  Laterality: N/A;   ESOPHAGOGASTRODUODENOSCOPY (EGD) WITH PROPOFOL N/A 06/30/2022   Procedure: ESOPHAGOGASTRODUODENOSCOPY (EGD) WITH PROPOFOL;  Surgeon: Lanelle Bal, DO;  [facility];  Service: Endoscopy;  Laterality: N/A;   ESOPHAGOGASTRODUODENOSCOPY (EGD) WITH PROPOFOL N/A 09/03/2022   Procedure: ESOPHAGOGASTRODUODENOSCOPY (EGD) WITH PROPOFOL;  Surgeon: Dolores Frame, MD;  [facility];  Service: Gastroenterology;  Laterality: N/A;   HALLUX VALGUS BASE WEDGE Right 06/09/2015   Procedure: Base wedge osteotomy with modified McBride right foot ;  Surgeon: Linus Galas, MD;  Location: [facility];  Service: Podiatry;  Laterality: Right;   PORTACATH PLACEMENT Left 06/28/2020   Procedure: PORT-A-CATHETER PLACEMENT LEFT CHEST (attached catheter in left subclavian);  Surgeon: Lucretia Roers, MD;  Location: [facility];  Service: General;  Laterality: Left;   TUBAL LIGATION     VIDEO BRONCHOSCOPY WITH ENDOBRONCHIAL NAVIGATION N/A 03/09/2021   Procedure: VIDEO BRONCHOSCOPY WITH ENDOBRONCHIAL NAVIGATION;  Surgeon: Vida Rigger, MD;  Location: [facility];  Service: Thoracic;  Laterality: N/A;   VIDEO BRONCHOSCOPY WITH ENDOBRONCHIAL ULTRASOUND N/A 03/09/2021   Procedure: VIDEO BRONCHOSCOPY WITH ENDOBRONCHIAL ULTRASOUND;  Surgeon: Vida Rigger, MD;  Location: [facility];  Service: Thoracic;  Laterality: N/A;     reports that she quit smoking about 9 years ago. Her smoking use included cigarettes. She has a 60.00 pack-year smoking history. She has never used smokeless tobacco. She reports that she does not drink alcohol and does not use drugs.  No Known Allergies  Family History  Problem Relation Age of Onset   Alzheimer's disease Mother    COPD Father    Emphysema Father    Hypertension Father    Healthy Sister    Healthy Brother    Alzheimer's disease Maternal Grandmother    Healthy Sister    Healthy Sister    Healthy Sister    Prostate cancer Other        paternal grandmother's brother; dx in early 40s   Breast cancer Neg Hx     Family history reviewed and not pertinent.  Prior to Admission medications   Medication Sig Start Date End Date Taking?  Authorizing Provider  albuterol (VENTOLIN HFA) 108 (90 Base) MCG/ACT inhaler Inhale 2 puffs into the lungs every 6 (six) hours as needed for wheezing or shortness of breath. 03/10/23  Yes Emokpae, Courage, MD  amLODipine (NORVASC) 10 MG tablet Take 1 tablet (10 mg total) by mouth daily. 03/10/23 03/09/24 Yes Shon Hale, MD  aspirin EC 81 MG tablet Take 1 tablet (81 mg total) by mouth daily with breakfast. 07/01/22  Yes Emokpae, Courage, MD  atorvastatin (LIPITOR) 40 MG tablet Take 1 tablet (40 mg total) by mouth daily. 01/02/22  Yes Noralee Stain, DO  Budeson-Glycopyrrol-Formoterol (BREZTRI AEROSPHERE) 160-9-4.8 MCG/ACT AERO Inhale 2 puffs into the lungs 2 (two) times daily. 03/10/23  Yes Emokpae, Courage, MD  ferrous sulfate 325 (65 FE) MG tablet Take 1 tablet (325 mg total) by mouth every other day. 03/10/23  Yes Shon Hale, MD  ipratropium (ATROVENT HFA) 17 MCG/ACT inhaler Inhale 2 puffs into the lungs every 6 (six) hours as needed for wheezing. 03/10/23  Yes Emokpae, Courage, MD  ipratropium-albuterol (DUONEB) 0.5-2.5 (3) MG/3ML SOLN Take 3 mLs by nebulization every 6 (six) hours as needed. 03/10/23  Yes Emokpae, Courage, MD  levalbuterol (XOPENEX) 1.25 MG/3ML nebulizer solution Take 1.25 mg by nebulization in the morning, at noon, and at bedtime. 03/10/23  Yes  Shon Hale, MD  magnesium oxide (MAG-OX) 400 (240 Mg) MG tablet Take 1 tablet (400 mg total) by mouth 2 (two) times daily. 09/06/22  Yes Doreatha Massed, MD  niraparib tosylate (ZEJULA) 100 MG tablet Take 1 tablet (100 mg total) by mouth daily. May take at bedtime to reduce nausea and vomiting. 12/02/22  Yes Doreatha Massed, MD  nortriptyline (PAMELOR) 25 MG capsule Take 25 mg by mouth at bedtime.   Yes [provider]  OXYGEN Inhale 4 L into the lungs continuous.   Yes [provider]  pantoprazole (PROTONIX) 40 MG tablet Take 1 tablet (40 mg total) by mouth 2 (two) times daily. Patient taking  differently: Take 40 mg by mouth daily. 07/01/22 04-04-2023 Yes Emokpae, Courage, MD  potassium chloride (KLOR-CON M) 10 MEQ tablet Take 10 mEq by mouth daily. 03/03/23  Yes [provider]  primidone (MYSOLINE) 50 MG tablet Take 50 mg by mouth at bedtime.   Yes [provider]  acetaminophen (TYLENOL) 325 MG tablet Take 2 tablets (650 mg total) by mouth every 6 (six) hours as needed for mild pain or fever (or Fever >/= 101). Patient not taking: Reported on 2023-04-04 03/10/23   Shon Hale, MD  ALPRAZolam Prudy Feeler) 0.25 MG tablet Take 1 tablet (0.25 mg total) by mouth 3 (three) times daily as needed for anxiety. Patient not taking: Reported on Apr 04, 2023 03/10/23   Shon Hale, MD  guaiFENesin-dextromethorphan (ROBITUSSIN DM) 100-10 MG/5ML syrup Take 5 mLs by mouth every 4 (four) hours as needed for cough. Patient not taking: Reported on 04-04-2023 03/10/23   Shon Hale, MD    Physical Exam: Vitals:   04-Apr-2023 1230 04/04/2023 1300 04/04/2023 1330 04-Apr-2023 1400  BP: (!) 112/59 113/69 129/60 129/68  Pulse: (!) 107  (!) 112 (!) 112  Resp: (!) 26 (!) 24 (!) 29 20  Temp:      TempSrc:      SpO2: 95%  97% 96%  Weight:      Height:        Constitutional: Appears comfortable, not in any acute distress, deconditioned.  Remains on 4 L of supplemental oxygen. Vitals:   04/04/2023 1230 2023-04-04 1300 2023/04/04 1330 2023/04/04 1400  BP: (!) 112/59 113/69 129/60 129/68  Pulse: (!) 107  (!) 112 (!) 112  Resp: (!) 26 (!) 24 (!) 29 20  Temp:      TempSrc:      SpO2: 95%  97% 96%  Weight:      Height:       Eyes: PERRL, lids and conjunctivae normal ENMT: Mucous membranes are moist.  Posterior pharynx without exudate. Neck: normal, supple, no masses, no thyromegaly Respiratory: Wheezing bilaterally, normal respiratory effort, RR 16.  No accessory muscle use. Cardiovascular: S1-S2 heard, regular rate and rhythm, no murmur. Abdomen:  Soft, non tender, non distended, BS+ Musculoskeletal:  No edema, no cyanosis, no clubbing.  Good ROM, no contractures. Normal muscle tone.  Skin: no rashes, lesions, ulcers. No induration Neurologic: CN 2-12 grossly intact. Sensation intact, DTR normal. Strength 5/5 in all 4.  Psychiatric: Normal judgment and insight. Alert and oriented x 3. Normal mood.    Labs on Admission: I have personally reviewed following labs and imaging studies  CBC: Recent Labs  Lab 04-04-23 0927  WBC 4.8  NEUTROABS 3.3  HGB 6.8*  HCT 21.7*  MCV 87.5  PLT 206   Basic Metabolic Panel: Recent Labs  Lab 04-Apr-2023 0927  NA 132*  K 3.8  CL 91*  CO2 31  GLUCOSE 104*  BUN 7*  CREATININE 0.66  CALCIUM 8.4*   GFR: Estimated Creatinine Clearance: 51 mL/min (by C-G formula based on SCr of 0.66 mg/dL). Liver Function Tests: Recent Labs  Lab 04/14/2023 0927  AST 25  ALT 15  ALKPHOS 78  BILITOT 0.9  PROT 5.7*  ALBUMIN 2.7*   No results for input(s): "LIPASE", "AMYLASE" in the last 168 hours. No results for input(s): "AMMONIA" in the last 168 hours. Coagulation Profile: No results for input(s): "INR", "PROTIME" in the last 168 hours. Cardiac Enzymes: No results for input(s): "CKTOTAL", "CKMB", "CKMBINDEX", "TROPONINI" in the last 168 hours. BNP (last 3 results) No results for input(s): "PROBNP" in the last 8760 hours. HbA1C: No results for input(s): "HGBA1C" in the last 72 hours. CBG: No results for input(s): "GLUCAP" in the last 168 hours. Lipid Profile: No results for input(s): "CHOL", "HDL", "LDLCALC", "TRIG", "CHOLHDL", "LDLDIRECT" in the last 72 hours. Thyroid Function Tests: No results for input(s): "TSH", "T4TOTAL", "FREET4", "T3FREE", "THYROIDAB" in the last 72 hours. Anemia Panel: Recent Labs    03/22/2023 1107  FERRITIN 376*  TIBC 312  IRON 17*   Urine analysis:    Component Value Date/Time   COLORURINE YELLOW 11/15/2022 1345   APPEARANCEUR CLEAR 11/15/2022 1345   APPEARANCEUR Clear 10/08/2013 2007   LABSPEC 1.016 11/15/2022 1345    LABSPEC 1.003 10/08/2013 2007   PHURINE 7.0 11/15/2022 1345   GLUCOSEU NEGATIVE 11/15/2022 1345   GLUCOSEU Negative 10/08/2013 2007   HGBUR SMALL (A) 11/15/2022 1345   BILIRUBINUR NEGATIVE 11/15/2022 1345   BILIRUBINUR Negative 10/08/2013 2007   KETONESUR 20 (A) 11/15/2022 1345   PROTEINUR 30 (A) 11/15/2022 1345   NITRITE NEGATIVE 11/15/2022 1345   LEUKOCYTESUR MODERATE (A) 11/15/2022 1345   LEUKOCYTESUR Negative 10/08/2013 2007    Radiological Exams on Admission: DG Chest Portable 1 View  Result Date: 03/24/2023 CLINICAL DATA:  Dyspnea EXAM: PORTABLE CHEST 1 VIEW COMPARISON:  03/10/2023 FINDINGS: Calcified aorta. Normal cardiopericardial silhouette. Film is rotated. Stable interstitial changes and chronic lung changes. No pneumothorax or effusion. Subtle left lung opacity. Overlapping cardiac leads. Stable left subclavian chest port. Chronic appearing left-sided rib deformities IMPRESSION: Chronic lung changes. However new subtle opacity at the left lung base. Possible infiltrate. Recommend follow-up Electronically Signed   By: Karen Kays M.D.   On: 04/12/2023 09:46    EKG: Independently reviewed.  Sinus tachycardia, premature atrial complexes.  Assessment/Plan Principal Problem:   COPD with acute exacerbation (HCC) Active Problems:   Acute on chronic respiratory failure with hypoxia (HCC)   Ovarian cancer (HCC)   Essential hypertension   Iron deficiency anemia   Mixed hyperlipidemia   Thrombocytopenia (HCC)   Essential tremor  Symptomatic anemia: Patient presented with shortness of breath, generalized weakness,  fatigue and  malaise. She is found to have Hb 6.8 on arrival , last Hb on discharge 2 weeks ago  10.0. She denies any visible GI bleeding or melena. Stool for occult blood+.  Transfuse 1 unit PRBC, follow posttransfusion hemoglobin. GI is consulted, recommended EGD in the morning.  COPD exacerbation: Patient presented with worsening shortness of breath,   reports progressive symptoms She is found to have significant wheezing on exam. Continue Solu-Medrol 60 mg every 12 hours. Continue scheduled and as needed DuoNeb nebulization.  Community-acquired pneumonia: Patient presented with shortness of breath, cough. Chest x-ray shows infiltrates consistent with PNA Continue ceftriaxone and doxycycline. De-escalate antibiotics if procalcitonin is normal.  Chronic hypoxic respiratory failure: Continue supplemental  oxygen at 4 L/min. Oxygen requirement at baseline.  Elevated troponin: Patient denies any chest pain, dizziness palpitations EKG no ST-T wave changes. Likely demand ischemia in the setting of anemia. Continue to trend troponin.  Essential hypertension: Hold amlodipine as blood pressures on the soft side.  Barrett's esophagitis: Continue pantoprazole 40 mg IV every 12 hours.  Iron deficiency anemia: Continue iron supplementation. GI is consulted and scheduled to have EGD tomorrow. Follow-up posttransfusion H&H.  Hyperlipidemia: Resume statin.  Ovarian cancer: Resume chemotherapy as an outpatient.   DVT prophylaxis: SCDs Code Status: Full code Family Communication: No family at bedside Disposition Plan:  Admitted for COPD exacerbation, community-acquired pneumonia, and symptomatic anemia   Consults called: GI Admission status: Inpatient   Willeen Niece MD [facility]   If 7PM-7AM, please contact night-coverage   03/22/2023, 2:38 PM

## 2023-03-21 NOTE — Consult Note (Signed)
Gastroenterology Consult   Referring Provider: No ref. provider found Primary Care Physician:  Abram Sander, MD Primary Gastroenterologist: Dolores Frame, MD  Patient ID: [name]; [id number]; [birth year]   Admit date: 03/27/2023  LOS: 0 days   Date of Consultation: 03/27/2023  Reason for Consultation:  anemia, heme positive stool.  History of Present Illness   [name] is a 67 y.o. 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  GI consulted for evaluation of symptomatic anemia and heme positive stool.  ED Course: Labs - Hgb 6.8, MCV 87.5, platelets 206 sodium 132, BUN 7, albumin 2.7, normal LFTs.  BNP 46, lactic acid 1.8, fecal occult test positive. Chest x-ray with chronic lung changes, incidental opacity in left lung base, possible infiltrate.   Consult: Appears patient was scheduled for outpatient CT abdomen pelvis with contrast with Dr. Ellin Saba.  Has had increased shortness of breath over the last few days. No chest pain. Denies melena or brbpr. Good appetite. No weight loss. Denies N/V, reflux, dysphagia. Having daily soft bowel movement. No diarrhea. No abdominal pain.   Quit smoking 10 years ago.   Colonoscopy 09/03/22: -Sigmoid, descending, descending diverticulosis -Tortuous colon -Repeat in 10 years  EGD 09/03/22: -White nummular lesions on esophageal mucosa, brushings performed -Esophageal mucosal changes suspicious for short segment Barrett's s/p biopsy -4 cm noted hernia -Normal stomach -Normal duodenum -Intestinal metaplasia present on esophageal biopsy consistent with Barrett's -Recommend repeat EGD in 5 years.  EGD 06/30/22: -Grade B esophagitis between 25-31 cm -Cytology brushings negative for  Candida -Small hiatal hernia -Moderate Schatzki's ring -Moderate gastritis with shallow ulcerations in the gastric body with mild reactive changes, negative for H. Pylori -Duodenum normal  EGD 05/04/22: -Circumferential erosions trialing GE junction with four-quadrant linear lesions extending above 6 cm in the tubular esophagus. (Extensive erosive reflux esophagitis) -Moderate hiatal hernia -Normal-appearing gastric mucosa    Past Medical History:  Diagnosis Date   Anemia    Aortic atherosclerosis (HCC)    Arthritis    Asthma    Bilateral carotid artery stenosis    Cancer (HCC)    Gastric Cancer   COPD (chronic obstructive pulmonary disease) (HCC)    High cholesterol    Hyperlipidemia    Hypertension    Neuropathy    Osteoarthritis    Ovarian cancer (HCC)    Oxygen dependent    Peripheral vascular disease (HCC)    Peritoneal carcinoma (HCC)    Pneumonia 2019   Port-A-Cath in place 07/03/2020   Pulmonary emphysema [facility])     Past Surgical History:  Procedure Laterality Date   BIOPSY  06/30/2022   Procedure: BIOPSY;  Surgeon: Lanelle Bal, DO;  [facility];  Service: Endoscopy;;  gastric   BIOPSY  09/03/2022   Procedure: BIOPSY;  Surgeon: Dolores Frame, MD;  [facility]: [facility];  Service: Gastroenterology;;   COLONOSCOPY WITH PROPOFOL N/A 09/03/2022   Procedure: COLONOSCOPY WITH PROPOFOL;  Surgeon: Dolores Frame, MD;  [facility];  Service: Gastroenterology;  Laterality: N/A;  900 ASA 3   ESOPHAGEAL BRUSHING  09/03/2022   Procedure: ESOPHAGEAL BRUSHING;  Surgeon: Marguerita Merles, Reuel Boom, MD;  [facility]: [facility];  Service: Gastroenterology;;   ESOPHAGOGASTRODUODENOSCOPY (EGD) WITH PROPOFOL N/A 02/17/2021   Procedure: ESOPHAGOGASTRODUODENOSCOPY (  EGD) WITH PROPOFOL;  Surgeon: Marguerita Merles, Reuel Boom, MD;  [facility];  Service: Gastroenterology;  Laterality: N/A;   ESOPHAGOGASTRODUODENOSCOPY (EGD) WITH  PROPOFOL N/A 05/04/2022   Procedure: ESOPHAGOGASTRODUODENOSCOPY (EGD) WITH PROPOFOL;  Surgeon: Corbin Ade, MD;  [facility];  Service: Endoscopy;  Laterality: N/A;   ESOPHAGOGASTRODUODENOSCOPY (EGD) WITH PROPOFOL N/A 06/30/2022   Procedure: ESOPHAGOGASTRODUODENOSCOPY (EGD) WITH PROPOFOL;  Surgeon: Lanelle Bal, DO;  [facility];  Service: Endoscopy;  Laterality: N/A;   ESOPHAGOGASTRODUODENOSCOPY (EGD) WITH PROPOFOL N/A 09/03/2022   Procedure: ESOPHAGOGASTRODUODENOSCOPY (EGD) WITH PROPOFOL;  Surgeon: Dolores Frame, MD;  [facility];  Service: Gastroenterology;  Laterality: N/A;   HALLUX VALGUS BASE WEDGE Right 06/09/2015   Procedure: Base wedge osteotomy with modified McBride right foot ;  Surgeon: Linus Galas, MD;  Location: [facility];  Service: Podiatry;  Laterality: Right;   PORTACATH PLACEMENT Left 06/28/2020   Procedure: PORT-A-CATHETER PLACEMENT LEFT CHEST (attached catheter in left subclavian);  Surgeon: Lucretia Roers, MD;  Location: [facility];  Service: General;  Laterality: Left;   TUBAL LIGATION     VIDEO BRONCHOSCOPY WITH ENDOBRONCHIAL NAVIGATION N/A 03/09/2021   Procedure: VIDEO BRONCHOSCOPY WITH ENDOBRONCHIAL NAVIGATION;  Surgeon: Vida Rigger, MD;  Location: [facility];  Service: Thoracic;  Laterality: N/A;   VIDEO BRONCHOSCOPY WITH ENDOBRONCHIAL ULTRASOUND N/A 03/09/2021   Procedure: VIDEO BRONCHOSCOPY WITH ENDOBRONCHIAL ULTRASOUND;  Surgeon: Vida Rigger, MD;  Location: [facility];  Service: Thoracic;  Laterality: N/A;    Prior to Admission medications   Medication Sig Start Date End Date Taking? Authorizing Provider  acetaminophen (TYLENOL) 325 MG tablet Take 2 tablets (650 mg total) by mouth every 6 (six) hours as needed for mild pain or fever (or Fever >/= 101). 03/10/23   Emokpae, Courage, MD  albuterol (VENTOLIN HFA) 108 (90 Base) MCG/ACT inhaler Inhale 2 puffs into the lungs every 6 (six) hours as needed for wheezing or  shortness of breath. 03/10/23   Mariea Clonts, Courage, MD  ALPRAZolam (XANAX) 0.25 MG tablet Take 1 tablet (0.25 mg total) by mouth 3 (three) times daily as needed for anxiety. 03/10/23   Shon Hale, MD  amLODipine (NORVASC) 10 MG tablet Take 1 tablet (10 mg total) by mouth daily. 03/10/23 03/09/24  Shon Hale, MD  aspirin EC 81 MG tablet Take 1 tablet (81 mg total) by mouth daily with breakfast. 07/01/22   Shon Hale, MD  atorvastatin (LIPITOR) 40 MG tablet Take 1 tablet (40 mg total) by mouth daily. 01/02/22   Noralee Stain, DO  Budeson-Glycopyrrol-Formoterol (BREZTRI AEROSPHERE) 160-9-4.8 MCG/ACT AERO Inhale 2 puffs into the lungs 2 (two) times daily. 03/10/23   Shon Hale, MD  ferrous sulfate 325 (65 FE) MG tablet Take 1 tablet (325 mg total) by mouth every other day. 03/10/23   Shon Hale, MD  guaiFENesin-dextromethorphan (ROBITUSSIN DM) 100-10 MG/5ML syrup Take 5 mLs by mouth every 4 (four) hours as needed for cough. 03/10/23   Shon Hale, MD  ipratropium (ATROVENT HFA) 17 MCG/ACT inhaler Inhale 2 puffs into the lungs every 6 (six) hours as needed for wheezing. 03/10/23   Mariea Clonts, Courage, MD  ipratropium-albuterol (DUONEB) 0.5-2.5 (3) MG/3ML SOLN Take 3 mLs by nebulization every 6 (six) hours as needed. 03/10/23   Shon Hale, MD  levalbuterol (XOPENEX) 1.25 MG/3ML nebulizer solution Take 1.25 mg by nebulization in the morning, at noon, and at bedtime. 03/10/23   Shon Hale, MD  magnesium oxide (MAG-OX) 400 (240 Mg) MG tablet Take 1 tablet (400  mg total) by mouth 2 (two) times daily. 09/06/22   Doreatha Massed, MD  niraparib tosylate (ZEJULA) 100 MG tablet Take 1 tablet (100 mg total) by mouth daily. May take at bedtime to reduce nausea and vomiting. 12/02/22   Doreatha Massed, MD  nortriptyline (PAMELOR) 25 MG capsule Take 25 mg by mouth at bedtime.    [provider]  OXYGEN Inhale 3 L into the lungs continuous.    [provider]   pantoprazole (PROTONIX) 40 MG tablet Take 1 tablet (40 mg total) by mouth 2 (two) times daily. Patient taking differently: Take 40 mg by mouth daily. 07/01/22 03/07/23  Shon Hale, MD  potassium chloride (KLOR-CON M) 10 MEQ tablet Take 10 mEq by mouth daily. 03/03/23   [provider]  primidone (MYSOLINE) 50 MG tablet Take 50 mg by mouth at bedtime.    [provider]    Current Facility-Administered Medications  Medication Dose Route Frequency Provider Last Rate Last Admin   0.9 %  sodium chloride infusion (Manually program via Guardrails IV Fluids)   Intravenous Once Kommor, Madison, MD       pantoprazole (PROTONIX) injection 40 mg  40 mg Intravenous Once Kommor, Madison, MD       Current Outpatient Medications  Medication Sig Dispense Refill   acetaminophen (TYLENOL) 325 MG tablet Take 2 tablets (650 mg total) by mouth every 6 (six) hours as needed for mild pain or fever (or Fever >/= 101). 100 tablet 0   albuterol (VENTOLIN HFA) 108 (90 Base) MCG/ACT inhaler Inhale 2 puffs into the lungs every 6 (six) hours as needed for wheezing or shortness of breath. 18 g 3   ALPRAZolam (XANAX) 0.25 MG tablet Take 1 tablet (0.25 mg total) by mouth 3 (three) times daily as needed for anxiety. 20 tablet 0   amLODipine (NORVASC) 10 MG tablet Take 1 tablet (10 mg total) by mouth daily. 30 tablet 11   aspirin EC 81 MG tablet Take 1 tablet (81 mg total) by mouth daily with breakfast. 30 tablet 11   atorvastatin (LIPITOR) 40 MG tablet Take 1 tablet (40 mg total) by mouth daily. 30 tablet 1   Budeson-Glycopyrrol-Formoterol (BREZTRI AEROSPHERE) 160-9-4.8 MCG/ACT AERO Inhale 2 puffs into the lungs 2 (two) times daily. 10.7 g 11   ferrous sulfate 325 (65 FE) MG tablet Take 1 tablet (325 mg total) by mouth every other day. 30 tablet 3   guaiFENesin-dextromethorphan (ROBITUSSIN DM) 100-10 MG/5ML syrup Take 5 mLs by mouth every 4 (four) hours as needed for cough. 236 mL 0   ipratropium  (ATROVENT HFA) 17 MCG/ACT inhaler Inhale 2 puffs into the lungs every 6 (six) hours as needed for wheezing. 12.9 g 11   ipratropium-albuterol (DUONEB) 0.5-2.5 (3) MG/3ML SOLN Take 3 mLs by nebulization every 6 (six) hours as needed. 360 mL 2   levalbuterol (XOPENEX) 1.25 MG/3ML nebulizer solution Take 1.25 mg by nebulization in the morning, at noon, and at bedtime. 72 mL 1   magnesium oxide (MAG-OX) 400 (240 Mg) MG tablet Take 1 tablet (400 mg total) by mouth 2 (two) times daily. 120 tablet 3   niraparib tosylate (ZEJULA) 100 MG tablet Take 1 tablet (100 mg total) by mouth daily. May take at bedtime to reduce nausea and vomiting. 30 tablet 4   nortriptyline (PAMELOR) 25 MG capsule Take 25 mg by mouth at bedtime.     OXYGEN Inhale 3 L into the lungs continuous.     pantoprazole (PROTONIX) 40 MG tablet  Take 1 tablet (40 mg total) by mouth 2 (two) times daily. (Patient taking differently: Take 40 mg by mouth daily.) 180 tablet 3   potassium chloride (KLOR-CON M) 10 MEQ tablet Take 10 mEq by mouth daily.     primidone (MYSOLINE) 50 MG tablet Take 50 mg by mouth at bedtime.      Allergies as of 2023-03-30   (No Known Allergies)    Family History  Problem Relation Age of Onset   Alzheimer's disease Mother    COPD Father    Emphysema Father    Hypertension Father    Healthy Sister    Healthy Brother    Alzheimer's disease Maternal Grandmother    Healthy Sister    Healthy Sister    Healthy Sister    Prostate cancer Other        paternal grandmother's brother; dx in early 52s   Breast cancer Neg Hx     Social History   Socioeconomic History   Marital status: Divorced    Spouse name: Not on file   Number of children: 3   Years of education: Not on file   Highest education level: Not on file  Occupational History   Occupation: DISABLED  Tobacco Use   Smoking status: Former    Packs/day: 2.00    Years: 30.00    Additional pack years: 0.00    Total pack years: 60.00    Types:  Cigarettes    Quit date: 11/04/2013    Years since quitting: 9.3   Smokeless tobacco: Never  Vaping Use   Vaping Use: Never used  Substance and Sexual Activity   Alcohol use: No   Drug use: No   Sexual activity: Not Currently  Other Topics Concern   Not on file  Social History Narrative   Not on file   Social Determinants of Health   Financial Resource Strain: Low Risk  (09/27/2020)   Overall Financial Resource Strain (CARDIA)    Difficulty of Paying Living Expenses: Not hard at all  Food Insecurity: No Food Insecurity (03/07/2023)   Hunger Vital Sign    Worried About Running Out of Food in the Last Year: Never true    Ran Out of Food in the Last Year: Never true  Transportation Needs: Unmet Transportation Needs (03/07/2023)   PRAPARE - Transportation    Lack of Transportation (Medical): Yes    Lack of Transportation (Non-Medical): Yes  Physical Activity: Inactive (09/27/2020)   Exercise Vital Sign    Days of Exercise per Week: 0 days    Minutes of Exercise per Session: 0 min  Stress: No Stress Concern Present (09/27/2020)   [employer] of Occupational Health - Occupational Stress Questionnaire    Feeling of Stress : Not at all  Social Connections: Socially Isolated (09/27/2020)   Social Connection and Isolation Panel [NHANES]    Frequency of Communication with Friends and Family: More than three times a week    Frequency of Social Gatherings with Friends and Family: Never    Attends Religious Services: Never    Database administrator or Organizations: No    Attends Banker Meetings: Never    Marital Status: Divorced  Catering manager Violence: Not At Risk (03/07/2023)   Humiliation, Afraid, Rape, and Kick questionnaire    Fear of Current or Ex-Partner: No    Emotionally Abused: No    Physically Abused: No    Sexually Abused: No     Review of Systems  Gen: Denies any fever, chills, loss of appetite, change in weight or weight loss CV: Denies  chest pain, heart palpitations, syncope, edema  Resp: + shortness of breath. Denies cough, wheezing, coughing up blood, and pleurisy. GI: see HPI GU : Denies urinary burning, blood in urine, urinary frequency, and urinary incontinence. MS: Denies joint pain, limitation of movement, swelling, cramps, and atrophy.  Derm: Denies rash, itching, dry skin, hives. Psych: Denies depression, anxiety, memory loss, hallucinations, and confusion. Heme: Denies bruising or bleeding Neuro:  Denies any headaches, dizziness, paresthesias, shaking  Physical Exam   Vital Signs in last 24 hours: Temp:  [98.9 F (37.2 C)] 98.9 F (37.2 C) (05/03 0951) Pulse Rate:  [96-107] 96 (05/03 1015) Resp:  [23-28] 24 (05/03 1015) BP: (81-117)/(52-61) 112/61 (05/03 1000) SpO2:  [91 %-94 %] 91 % (05/03 1015) Weight:  [51.1 kg] 51.1 kg (05/03 0922)   General:   Alert, pleasant and cooperative Head:  Normocephalic and atraumatic. Eyes:  Sclera clear, no icterus.   Conjunctiva pink. Ears:  Normal auditory acuity. Neck:  Supple; no masses Lungs:  expiratory wheezing noted on exam, tachypnea Heart:  Regular rate and rhythm; no murmurs, clicks, rubs,  or gallops. Abdomen:  Soft, nontender and nondistended. No masses, hepatosplenomegaly or hernias noted. Normal bowel sounds, without guarding, and without rebound.   Rectal: deferred   Extremities:  Without clubbing or edema. Neurologic:  Alert and  oriented x4. Skin:  Intact without significant lesions or rashes. Psych:  Alert and cooperative. Normal mood and affect.  Intake/Output from previous day: No intake/output data recorded. Intake/Output this shift: No intake/output data recorded.  Labs/Studies   Recent Labs Recent Labs    04/07/2023 0927  WBC 4.8  HGB 6.8*  HCT 21.7*  PLT 206   BMET Recent Labs    04/14/2023 0927  NA 132*  K 3.8  CL 91*  CO2 31  GLUCOSE 104*  BUN 7*  CREATININE 0.66  CALCIUM 8.4*   LFT Recent Labs    04/07/2023 0927   PROT 5.7*  ALBUMIN 2.7*  AST 25  ALT 15  ALKPHOS 78  BILITOT 0.9   PT/INR No results for input(s): "LABPROT", "INR" in the last 72 hours. Hepatitis Panel No results for input(s): "HEPBSAG", "HCVAB", "HEPAIGM", "HEPBIGM" in the last 72 hours. C-Diff No results for input(s): "CDIFFTOX" in the last 72 hours.  Radiology/Studies DG Chest Portable 1 View  Result Date: 03/25/2023 CLINICAL DATA:  Dyspnea EXAM: PORTABLE CHEST 1 VIEW COMPARISON:  03/10/2023 FINDINGS: Calcified aorta. Normal cardiopericardial silhouette. Film is rotated. Stable interstitial changes and chronic lung changes. No pneumothorax or effusion. Subtle left lung opacity. Overlapping cardiac leads. Stable left subclavian chest port. Chronic appearing left-sided rib deformities IMPRESSION: Chronic lung changes. However new subtle opacity at the left lung base. Possible infiltrate. Recommend follow-up Electronically Signed   By: Karen Kays M.D.   On: 04/01/2023 09:46     Assessment   [name] is a 67 y.o. 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  GI consulted for evaluation of symptomatic anemia and heme positive stool.  Symptomatic anemia, heme positive stool: Hemoglobin 6.8 on admission (10.2, two weeks ago).  She denies any melena, review PR, lack of appetite, early satiety, uncontrolled reflux,  abdominal pain, dysphagia, nausea, vomiting, unintentional weight loss.  Denies any regular use of NSAIDs.  Quit smoking about 10 years prior.  Denies any alcohol use.  Having a soft bowel movement daily, no diarrhea.  He continues to follow with Dr. Ellin Saba in regards to her ovarian cancer.  She presented with some shortness of breath that she felt as though is  related to her COPD.  She uses 3 L of oxygen at home baseline.  She has had multiple prior EGDs with her last one being in October 2023 with esophageal changes consistent with Barrett's esophagus, and white lesions in her esophagus with brushings negative for Candida.  Last colonoscopy in October 2023 unremarkable.  She has been compliant with her PPI twice daily at home.  No urgent need for procedure at this time, she was develop any episodes of GI bleeding that she may benefit from repeat EGD +/- capsule study.  Plan / Recommendations   Trend H/H, transfuse for hgb <7 Continue PPI twice daily Agree with at least 1u PRBC today Npo at midnight, reassess for need for EGD in the AM.  Monitor for GI bleeding, could consider outpatient capsule study if she has further decline in hemoglobin.     Apr 05, 2023, 10:51 AM  Brooke Bonito, MSN, FNP-BC, AGACNP-BC [facility]

## 2023-03-22 DIAGNOSIS — D696 Thrombocytopenia, unspecified: Secondary | ICD-10-CM | POA: Diagnosis not present

## 2023-03-22 DIAGNOSIS — C569 Malignant neoplasm of unspecified ovary: Secondary | ICD-10-CM | POA: Diagnosis not present

## 2023-03-22 DIAGNOSIS — J9622 Acute and chronic respiratory failure with hypercapnia: Secondary | ICD-10-CM

## 2023-03-22 DIAGNOSIS — J441 Chronic obstructive pulmonary disease with (acute) exacerbation: Secondary | ICD-10-CM | POA: Diagnosis not present

## 2023-03-22 DIAGNOSIS — D509 Iron deficiency anemia, unspecified: Secondary | ICD-10-CM | POA: Diagnosis not present

## 2023-03-22 DIAGNOSIS — J9621 Acute and chronic respiratory failure with hypoxia: Secondary | ICD-10-CM | POA: Diagnosis not present

## 2023-03-22 LAB — COMPREHENSIVE METABOLIC PANEL
ALT: 14 U/L (ref 0–44)
AST: 20 U/L (ref 15–41)
Albumin: 2.4 g/dL — ABNORMAL LOW (ref 3.5–5.0)
Alkaline Phosphatase: 70 U/L (ref 38–126)
Anion gap: 7 (ref 5–15)
BUN: 8 mg/dL (ref 8–23)
CO2: 28 mmol/L (ref 22–32)
Calcium: 7.8 mg/dL — ABNORMAL LOW (ref 8.9–10.3)
Chloride: 99 mmol/L (ref 98–111)
Creatinine, Ser: 0.43 mg/dL — ABNORMAL LOW (ref 0.44–1.00)
GFR, Estimated: >60 mL/min (ref >60)
Glucose, Bld: 133 mg/dL — ABNORMAL HIGH (ref 70–99)
Potassium: 3.6 mmol/L (ref 3.5–5.1)
Sodium: 134 mmol/L — ABNORMAL LOW (ref 135–145)
Total Bilirubin: 0.6 mg/dL (ref 0.3–1.2)
Total Protein: 5.2 g/dL — ABNORMAL LOW (ref 6.5–8.1)

## 2023-03-22 LAB — CBC
HCT: 28.1 % — ABNORMAL LOW (ref 36.0–46.0)
Hemoglobin: 9.3 g/dL — ABNORMAL LOW (ref 12.0–15.0)
MCH: 28.8 pg (ref 26.0–34.0)
MCHC: 33.1 g/dL (ref 30.0–36.0)
MCV: 87 fL (ref 80.0–100.0)
Platelets: 141 10*3/uL — ABNORMAL LOW (ref 150–400)
RBC: 3.23 MIL/uL — ABNORMAL LOW (ref 3.87–5.11)
RDW: 16.6 % — ABNORMAL HIGH (ref 11.5–15.5)
WBC: 3.8 10*3/uL — ABNORMAL LOW (ref 4.0–10.5)
nRBC: 1.8 % — ABNORMAL HIGH (ref 0.0–0.2)

## 2023-03-22 LAB — MAGNESIUM: Magnesium: 1.8 mg/dL (ref 1.7–2.4)

## 2023-03-22 LAB — HIV ANTIBODY (ROUTINE TESTING W REFLEX): HIV Screen 4th Generation wRfx: NONREACTIVE

## 2023-03-22 LAB — PHOSPHORUS: Phosphorus: 3.5 mg/dL (ref 2.5–4.6)

## 2023-03-22 LAB — MRSA NEXT GEN BY PCR, NASAL: MRSA by PCR Next Gen: NOT DETECTED

## 2023-03-22 MED ORDER — BUDESONIDE 0.5 MG/2ML IN SUSP
0.5000 mg | Freq: Two times a day (BID) | RESPIRATORY_TRACT | Status: DC
  Administered 2023-03-22 – 2023-03-23 (×2): 0.5 mg via RESPIRATORY_TRACT
  Filled 2023-03-22 (×2): qty 2

## 2023-03-22 MED ORDER — CHLORHEXIDINE GLUCONATE CLOTH 2 % EX PADS
6.0000 | MEDICATED_PAD | Freq: Every day | CUTANEOUS | Status: DC
  Administered 2023-03-22 – 2023-03-23 (×2): 6 via TOPICAL

## 2023-03-22 MED ORDER — REVEFENACIN 175 MCG/3ML IN SOLN
175.0000 ug | Freq: Every day | RESPIRATORY_TRACT | Status: DC
  Administered 2023-03-22 – 2023-03-23 (×2): 175 ug via RESPIRATORY_TRACT
  Filled 2023-03-22 (×2): qty 3

## 2023-03-22 MED ORDER — ARFORMOTEROL TARTRATE 15 MCG/2ML IN NEBU
15.0000 ug | INHALATION_SOLUTION | Freq: Two times a day (BID) | RESPIRATORY_TRACT | Status: DC
  Administered 2023-03-22 – 2023-03-23 (×2): 15 ug via RESPIRATORY_TRACT
  Filled 2023-03-22 (×2): qty 2

## 2023-03-22 NOTE — Hospital Course (Addendum)
67 year old female with a history of ovarian carcinoma with omental carcinomatosis currently on niraparib, COPD Gold IV, chronic respiratory failure on 3 L, hypertension, hyperlipidemia, and tremor presenting with shortness of breath that has worsened over the last 2 to 3 days. Notably, the patient had a recent hospital admission from 03/06/2023 to 03/10/2023 due to COPD exacerbation.  She was felt to be fluid overloaded and treated with Lasix during that hospitalization also. Patient was admitted on 4/15 and was discharged on 4/18 due to acute on chronic respiratory failure with hypoxia secondary to COPD exacerbation which was treated with breathing treatment, antibiotics and steroids.  The patient denies any fevers, chills, chest pain, nausea, vomiting, diarrhea, abdominal pain. Patient was not aware of anything that may have triggered the exacerbation, she endorsed the boyfriend smokes cigarettes, but he usually smokes outside the house.   ED Course:  In the emergency department, she was tachypneic and tachycardic, BP was 181/107, temperature 98.4 F, O2 sat was 91% on 4 LPM of oxygen.  In the ED showed normocytic anemia, WBC 13.3.  BMP showed sodium 128, potassium 3.9, chloride 83, bicarb 33, blood glucose 138, BUN 16, creatinine 0.58, albumin 3.4, magnesium 1.8, troponin x 1 - 17, BNP 201. Chest x-ray showed No acute chest findings.  COPD with biapical scarring change.  Aortic atherosclerosis. She was treated with IV Lasix 20 mg x 1, DuoNeb was provided and IV fentanyl at 0.5 mcg x 1 was given due to right-sided back pain.  Hospitalist was asked to admit patient for further evaluation and management.  GI was consulted.  They recommended outpt capsule study.   Given acute exacerbation of respiratory symptoms , they held off on any testing at the moment unless there is overt gastrointestinal bleeding.   Pt was transfused 2 units PRBC and her Hgb remained stable after transfusion.  Pt gradually improved  clinically and remained hemodynamically stable.  There was signs of active clinical bleed.  She was continued on bronchodilators and IV steroids.  Her respiratory status gradually improved.    In the afternoon of 04/03/2023, the patient sat up in chair without difficulty.  Later in the afternoon, the patient was found to be unresponsive with oxygen saturation in the 40s.  There was no ectopy or abnormal dysrhythmia.  Code Blue was called.  ACLS protocol was initiated.  Dr. Jearld Fenton came to assist with patient care and the patient was intubated. Excessive gastric contents coming out of patient's mouth during intubation. Gastric contents noted in both the trachea and esophagus on intubation.  Please see RN documentation for ACLS.  Patient declared dead at 9.

## 2023-03-22 NOTE — Progress Notes (Signed)
Katrinka Blazing, M.D. Gastroenterology & Hepatology   Interval History:  No acute events overnight. No reported melena or hematochezia.  Patient denies any complaints such as nausea, vomiting, fever, chills, abdominal pain or distention.  Still feeling some shortness of breath and cough. Patient received 2 units of PRBC with adequate response, repeat hemoglobin today was 9.3.  Inpatient Medications:  Current Facility-Administered Medications:    0.9 %  sodium chloride infusion, , Intravenous, Continuous, Willeen Niece, MD, Last Rate: 75 mL/hr at 03/22/23 0736, New Bag at 03/22/23 0736   acetaminophen (TYLENOL) tablet 650 mg, 650 mg, Oral, Q6H PRN **OR** acetaminophen (TYLENOL) suppository 650 mg, 650 mg, Rectal, Q6H PRN, Idelle Leech, Pardeep, MD   albuterol (PROVENTIL) (2.5 MG/3ML) 0.083% nebulizer solution 2.5 mg, 2.5 mg, Nebulization, Q6H, Khatri, Pardeep, MD, 2.5 mg at 03/20/2023 2005   albuterol (PROVENTIL) (2.5 MG/3ML) 0.083% nebulizer solution, 10 mg/hr, Nebulization, Once, Kommor, [place], MD   cefTRIAXone (ROCEPHIN) 1 g in sodium chloride 0.9 % 100 mL IVPB, 1 g, Intravenous, Q24H, Khatri, Pardeep, MD, Last Rate: 200 mL/hr at 03/25/2023 1944, 1 g at 03/30/2023 1944   Chlorhexidine Gluconate Cloth 2 % PADS 6 each, 6 each, Topical, Daily, Doreatha Massed, MD   docusate sodium (COLACE) capsule 100 mg, 100 mg, Oral, BID, Idelle Leech, Pardeep, MD, 100 mg at 03/22/2023 2106   doxycycline (VIBRA-TABS) tablet 100 mg, 100 mg, Oral, Q12H, Khatri, Pardeep, MD, 100 mg at 04/06/2023 2106   ferrous sulfate tablet 325 mg, 325 mg, Oral, QODAY, Khatri, Pardeep, MD, 325 mg at 04/13/2023 1331   guaiFENesin (MUCINEX) 12 hr tablet 600 mg, 600 mg, Oral, BID, Idelle Leech, Pardeep, MD, 600 mg at 04/08/2023 2107   methylPREDNISolone sodium succinate (SOLU-MEDROL) 125 mg/2 mL injection 60 mg, 60 mg, Intravenous, BID, Idelle Leech, Pardeep, MD, 60 mg at 04/14/2023 2109   ondansetron (ZOFRAN) tablet 4 mg, 4 mg, Oral, Q6H PRN **OR**  ondansetron (ZOFRAN) injection 4 mg, 4 mg, Intravenous, Q6H PRN, Idelle Leech, Pardeep, MD   pantoprazole (PROTONIX) injection 40 mg, 40 mg, Intravenous, Q12H, Idelle Leech, Pardeep, MD, 40 mg at 03/31/2023 2107   I/O    Intake/Output Summary (Last 24 hours) at 03/22/2023 0757 Last data filed at 03/22/2023 0400 Gross per 24 hour  Intake 1465.65 ml  Output 1500 ml  Net -34.35 ml     Physical Exam: Temp:  [97.7 F (36.5 C)-98.9 F (37.2 C)] 98.3 F (36.8 C) (05/04 0744) Pulse Rate:  [58-112] 92 (05/04 0744) Resp:  [14-29] 20 (05/04 0744) BP: (81-163)/(52-81) 163/71 (05/04 0500) SpO2:  [91 %-100 %] 98 % (05/04 0744) Weight:  [51.1 kg-52.5 kg] 51.9 kg (05/04 0527)  Temp (24hrs), Avg:98.4 F (36.9 C), Min:97.7 F (36.5 C), Max:98.9 F (37.2 C) GENERAL: The patient is AO x3, in no acute distress. Using oxygen via [place]. HEENT: Head is normocephalic and atraumatic. EOMI are intact. Mouth is well hydrated and without lesions. NECK: Supple. No masses LUNGS: Presence of some rhonchi in both bases. HEART: RRR, normal s1 and s2. ABDOMEN: Soft, nontender, no guarding, no peritoneal signs, and nondistended. BS +. No masses. EXTREMITIES: Without any cyanosis, clubbing, rash, lesions or edema. NEUROLOGIC: AOx3, no focal motor deficit. SKIN: no jaundice, no rashes  Laboratory Data: CBC:     Component Value Date/Time   WBC 3.8 (L) 03/22/2023 0417   RBC 3.23 (L) 03/22/2023 0417   HGB 9.3 (L) 03/22/2023 0417   HGB 10.6 (L) 11/07/2014 0018   HCT 28.1 (L) 03/22/2023 0417   HCT 24.6 (L) 05/23/2021 0945  PLT 141 (L) 03/22/2023 0417   PLT 323 11/07/2014 0018   MCV 87.0 03/22/2023 0417   MCV 82 11/07/2014 0018   MCH 28.8 03/22/2023 0417   MCHC 33.1 03/22/2023 0417   RDW 16.6 (H) 03/22/2023 0417   RDW 13.5 11/07/2014 0018   LYMPHSABS 0.9 April 19, 2023 0927   LYMPHSABS 0.6 (L) 10/29/2013 0517   MONOABS 0.3 2023/04/19 0927   MONOABS 0.6 10/29/2013 0517   EOSABS 0.1 2023/04/19 0927   EOSABS 0.0 10/29/2013  0517   BASOSABS 0.0 19-Apr-2023 0927   BASOSABS 0.0 10/29/2013 0517   COAG:  Lab Results  Component Value Date   INR 0.9 01/02/2023   INR 0.9 10/29/2022   INR 0.9 06/28/2022    BMP:     Latest Ref Rng & Units 03/22/2023    4:17 AM April 19, 2023    9:27 AM 03/10/2023    5:07 AM  BMP  Glucose 70 - 99 mg/dL 161  096  045   BUN 8 - 23 mg/dL 8  7  25    Creatinine 0.44 - 1.00 mg/dL 4.09  8.11  9.14   Sodium 135 - 145 mmol/L 134  132  124   Potassium 3.5 - 5.1 mmol/L 3.6  3.8  3.9   Chloride 98 - 111 mmol/L 99  91  80   CO2 22 - 32 mmol/L 28  31  34   Calcium 8.9 - 10.3 mg/dL 7.8  8.4  8.4     HEPATIC:     Latest Ref Rng & Units 03/22/2023    4:17 AM 19-Apr-2023    9:27 AM 03/10/2023    5:07 AM  Hepatic Function  Total Protein 6.5 - 8.1 g/dL 5.2  5.7    Albumin 3.5 - 5.0 g/dL 2.4  2.7  2.7   AST 15 - 41 U/L 20  25    ALT 0 - 44 U/L 14  15    Alk Phosphatase 38 - 126 U/L 70  78    Total Bilirubin 0.3 - 1.2 mg/dL 0.6  0.9      CARDIAC:  Lab Results  Component Value Date   CKTOTAL 131 07/19/2014   CKMB 1.1 07/19/2014   TROPONINI <0.03 09/13/2018      Imaging: I personally reviewed and interpreted the available labs, imaging and endoscopic files.   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  Gastroenterology was consulted for severe anemia and heme positive stool.   The patient is currently being treated for COPD exacerbation/pneumonia with antibiotic treatment.  She was transiently hypotensive upon admission but has responded to sepsis protocol.  She has not presented any overt gastrointestinal bleeding.  Labs showed recurrent iron deficiency anemia that responded adequately to blood transfusion.  Given the fact that she had relatively  recent endoscopic investigations in the past without any sources for chronic GI losses, it would be adequate to investigate this further with a capsule endoscopy which will be scheduled as outpatient as the patient is currently recovering from pneumonia.  Notably, given her history of peritoneal carcinomatosis, and initial patency capsule will need to be performed to evaluate the likelihood of obstruction due to capsule endoscopy.  Will recommend increasing her outpatient oral ferrous sulfate to twice a day dosing.  Also will recommend following up with oncology/hematology  closely upon discharge.  - Repeat CBC qday, transfuse if Hb <7 - Pantoprazole 40 mg q12h p.o. - 2 large bore IV lines - Active T/S -Diet as tolerated - Avoid NSAIDs - Will proceed with patency capsule and capsule endoscopy as outpatient. -Increase oral ferrous sulfate to twice a day dosing -Close follow-up with hematology/oncology - Patient will follow up in GI clinic in 2-3 weeks. - GI service will sign-off, please call us back if you have any more questions.  Katrinka Blazing, MD Gastroenterology and Hepatology [facility]astroenterology

## 2023-03-22 NOTE — Progress Notes (Signed)
PROGRESS NOTE  [name] UJW:[id number] DOB: 22-Oct-1956 DOA: 04/09/2023 PCP: Abram Sander, MD  Brief History:  -year-old female with a history of ovarian carcinoma with omental carcinomatosis currently on niraparib, COPD Gold IV, chronic respiratory failure on 3 L, hypertension, hyperlipidemia, and tremor presenting with shortness of breath that has worsened over the last 2 to 3 days. Notably, the patient had a recent hospital admission from 03/06/2023 to 03/10/2023 due to COPD exacerbation.  She was felt to be fluid overloaded and treated with Lasix during that hospitalization also. Patient was admitted on 4/15 and was discharged on 4/18 due to acute on chronic respiratory failure with hypoxia secondary to COPD exacerbation which was treated with breathing treatment, antibiotics and steroids.  The patient denies any fevers, chills, chest pain, nausea, vomiting, diarrhea, abdominal pain. Patient was not aware of anything that may have triggered the exacerbation, she endorsed the boyfriend smokes cigarettes, but he usually smokes outside the house.   ED Course:  In the emergency department, she was tachypneic and tachycardic, BP was 181/107, temperature 98.4 F, O2 sat was 91% on 4 LPM of oxygen.  In the ED showed normocytic anemia, WBC 13.3.  BMP showed sodium 128, potassium 3.9, chloride 83, bicarb 33, blood glucose 138, BUN 16, creatinine 0.58, albumin 3.4, magnesium 1.8, troponin x 1 - 17, BNP 201. Chest x-ray showed No acute chest findings.  COPD with biapical scarring change.  Aortic atherosclerosis. She was treated with IV Lasix 20 mg x 1, DuoNeb was provided and IV fentanyl at 0.5 mcg x 1 was given due to right-sided back pain.  Hospitalist was asked to admit patient for further evaluation and management.        Assessment/Plan: Symptomatic anemia/FOBT positive -GI consulted -No signs of active bleeding -GI recommends outpatient capsule endoscopy -2 units PRBC  transfused -Ferrous sulfate increased to twice daily -Give Nulecit IV x 1 -Iron saturation 5%, ferritin 376  Acute on chronic respiratory failure with hypoxia and hypercarbia -Secondary to COPD exacerbation -04/06/2023 VBG--7.42/60/<31/38 -Continue IV Solu-Medrol -Start Yupelri -Start Brovana -Start Pulmicort -Continue albuterol nebs -Initially saturating 88% on 4 L -continue ceftriaxone and azithro for now  COPD exacerbation -Treatment as above  Elevated troponin: -Patient denies any chest pain -personally reviewed EKG-sinus, nonspecific ST changes -due to demand ischemia in the setting of anemia. -Continue to trend troponin.   Thrombocytopenia (HCC) Chronic Monitor for signs of bleed SCDs   Hyponatremia Due to volume depletion Continue IVF>>improving   Ovarian cancer (HCC) Restart niraparib 200 mg daily Follow-up with Dr. Ellin Saba   Mixed hyperlipidemia Continue statin  Essential hypertension -holding amlodipine due to soft BPs  Anxiety -PDMP reviewed -Alprazolam 0.25 mg, #20, last refill 03/06/2023        Family Communication:   no Family at bedside  Consultants:  none  Code Status:  FULL   DVT Prophylaxis:  SCDs   Procedures: As Listed in Progress Note Above  Antibiotics: Ceftriaxone 5/3>> Doxy 5/3>>    Subjective: Patient states that she was still short of breath with minimal exertion.  She has a nonproductive cough.  She denies any nausea, vomiting, direct abdominal pain.  There is no fevers or chills.  Objective: Vitals:   03/22/23 1100 03/22/23 1141 03/22/23 1200 03/22/23 1352  BP: (!) 151/62  (!) 165/84   Pulse: 97 99 (!) 108   Resp: (!) 24 (!) 22 (!) 25   Temp:  98 F (36.7  C)    TempSrc:  Oral    SpO2: 100% 100% 97% 95%  Weight:      Height:        Intake/Output Summary (Last 24 hours) at 03/22/2023 1408 Last data filed at 03/22/2023 1300 Gross per 24 hour  Intake 2486.1 ml  Output 2400 ml  Net 86.1 ml   Weight  change:  Exam:  General:  Pt is alert, follows commands appropriately, not in acute distress HEENT: No icterus, No thrush, No neck mass, [place]/AT Cardiovascular: RRR, S1/S2, no rubs, no gallops Respiratory: Bibasilar rales, right greater than left.  No wheezing.  Good air movement.  Diminished breath sounds. Abdomen: Soft/+BS, non tender, non distended, no guarding Extremities: No edema, No lymphangitis, No petechiae, No rashes, no synovitis   Data Reviewed: I have personally reviewed following labs and imaging studies Basic Metabolic Panel: Recent Labs  Lab 04/13/2023 0927 03/22/23 0417  NA 132* 134*  K 3.8 3.6  CL 91* 99  CO2 31 28  GLUCOSE 104* 133*  BUN 7* 8  CREATININE 0.66 0.43*  CALCIUM 8.4* 7.8*  MG  --  1.8  PHOS  --  3.5   Liver Function Tests: Recent Labs  Lab 04/10/2023 0927 03/22/23 0417  AST 25 20  ALT 15 14  ALKPHOS 78 70  BILITOT 0.9 0.6  PROT 5.7* 5.2*  ALBUMIN 2.7* 2.4*   No results for input(s): "LIPASE", "AMYLASE" in the last 168 hours. No results for input(s): "AMMONIA" in the last 168 hours. Coagulation Profile: No results for input(s): "INR", "PROTIME" in the last 168 hours. CBC: Recent Labs  Lab 04/17/2023 0927 03/28/2023 2049 03/22/23 0417  WBC 4.8  --  3.8*  NEUTROABS 3.3  --   --   HGB 6.8* 9.4* 9.3*  HCT 21.7* 29.2* 28.1*  MCV 87.5  --  87.0  PLT 206  --  141*   Cardiac Enzymes: No results for input(s): "CKTOTAL", "CKMB", "CKMBINDEX", "TROPONINI" in the last 168 hours. BNP: Invalid input(s): "POCBNP" CBG: Recent Labs  Lab 03/31/2023 2021  GLUCAP 180*   HbA1C: No results for input(s): "HGBA1C" in the last 72 hours. Urine analysis:    Component Value Date/Time   COLORURINE YELLOW 11/15/2022 1345   APPEARANCEUR CLEAR 11/15/2022 1345   APPEARANCEUR Clear 10/08/2013 2007   LABSPEC 1.016 11/15/2022 1345   LABSPEC 1.003 10/08/2013 2007   PHURINE 7.0 11/15/2022 1345   GLUCOSEU NEGATIVE 11/15/2022 1345   GLUCOSEU Negative  10/08/2013 2007   HGBUR SMALL (A) 11/15/2022 1345   BILIRUBINUR NEGATIVE 11/15/2022 1345   BILIRUBINUR Negative 10/08/2013 2007   KETONESUR 20 (A) 11/15/2022 1345   PROTEINUR 30 (A) 11/15/2022 1345   NITRITE NEGATIVE 11/15/2022 1345   LEUKOCYTESUR MODERATE (A) 11/15/2022 1345   LEUKOCYTESUR Negative 10/08/2013 2007   Sepsis Labs: @LABRCNTIP (procalcitonin:4,lacticidven:4) ) Recent Results (from the past 240 hour(s))  MRSA Next Gen by PCR, Nasal     Status: None   Collection Time: 04/11/2023  5:20 PM   Specimen: Nasal Mucosa; Nasal Swab  Result Value Ref Range Status   MRSA by PCR Next Gen NOT DETECTED NOT DETECTED Final    Comment: (NOTE) The GeneXpert MRSA Assay (FDA approved for NASAL specimens only), is one component of a comprehensive MRSA colonization surveillance program. It is not intended to diagnose MRSA infection nor to guide or monitor treatment for MRSA infections. Test performance is not FDA approved in patients less than 54 years old. Performed at [facility], [address]  [place], [place] [address]      Scheduled Meds:  albuterol  2.5 mg Nebulization Q6H   albuterol  10 mg/hr Nebulization Once   arformoterol  15 mcg Nebulization BID   budesonide (PULMICORT) nebulizer solution  0.5 mg Nebulization BID   Chlorhexidine Gluconate Cloth  6 each Topical Daily   docusate sodium  100 mg Oral BID   doxycycline  100 mg Oral Q12H   ferrous sulfate  325 mg Oral QODAY   guaiFENesin  600 mg Oral BID   methylPREDNISolone (SOLU-MEDROL) injection  60 mg Intravenous BID   pantoprazole (PROTONIX) IV  40 mg Intravenous Q12H   revefenacin  175 mcg Nebulization Daily   Continuous Infusions:  sodium chloride 75 mL/hr at 03/22/23 0736   cefTRIAXone (ROCEPHIN)  IV 1 g (22-Mar-2023 1944)    Procedures/Studies: DG Chest Portable 1 View  Result Date: 03-22-23 CLINICAL DATA:  Dyspnea EXAM: PORTABLE CHEST 1 VIEW COMPARISON:  03/10/2023 FINDINGS: Calcified aorta. Normal  cardiopericardial silhouette. Film is rotated. Stable interstitial changes and chronic lung changes. No pneumothorax or effusion. Subtle left lung opacity. Overlapping cardiac leads. Stable left subclavian chest port. Chronic appearing left-sided rib deformities IMPRESSION: Chronic lung changes. However new subtle opacity at the left lung base. Possible infiltrate. Recommend follow-up Electronically Signed   By: Karen Kays M.D.   On: 03-22-23 09:46   DG Chest 2 View  Result Date: 03/10/2023 CLINICAL DATA:  Dyspnea, COPD EXAM: CHEST - 2 VIEW COMPARISON:  Previous studies including the examination of 03/07/2023 FINDINGS: Cardiac size is within normal limits. Increase in the AP diameter of chest suggests COPD. There are no signs of pulmonary edema or new focal pulmonary consolidation. Increased interstitial markings in both upper lung fields may suggest scarring. There is no pleural effusion or pneumothorax. IMPRESSION: COPD.  There are no new infiltrates or signs of pulmonary edema. Electronically Signed   By: Ernie Avena M.D.   On: 03/10/2023 08:46   ECHOCARDIOGRAM COMPLETE  Result Date: 03/07/2023    ECHOCARDIOGRAM REPORT   Patient Name:   NASTASSJA BIELAT Date of Exam: 03/07/2023 Medical Rec #:  [id number]     Height:       60.0 in Accession #:    [id number]    Weight:       115.7 lb Date of Birth:  Aug 09, 1956     BSA:          1.479 m Patient Age:    66 years      BP:           143/115 mmHg Patient Gender: F             HR:           92 bpm. [facility] Procedure: 2D Echo, Cardiac Doppler and Color Doppler Indications:    CHF-Acute Diastolic I50.31  History:        Patient has prior history of Echocardiogram examinations, most                 recent 12/18/2020. Cancer; Risk Factors:Hypertension and                 Dyslipidemia.  Sonographer:    Aron Baba Referring Phys: [id number] OLADAPO ADEFESO  Sonographer Comments: Suboptimal parasternal window. Image acquisition challenging due to  patient body habitus, Image acquisition challenging due to COPD and Image acquisition challenging due to respiratory motion. IMPRESSIONS  1. Left ventricular ejection fraction, by estimation, is 55 to 60%.  The left ventricle has normal function. The left ventricle has no regional wall motion abnormalities. Left ventricular diastolic parameters are consistent with Grade I diastolic dysfunction (impaired relaxation).  2. Right ventricular systolic function is normal. The right ventricular size is normal.  3. The mitral valve is normal in structure. No evidence of mitral valve regurgitation. No evidence of mitral stenosis.  4. The aortic valve is tricuspid. There is mild calcification of the aortic valve. There is mild thickening of the aortic valve. Aortic valve regurgitation is not visualized. No aortic stenosis is present.  5. The inferior vena cava is normal in size with greater than 50% respiratory variability, suggesting right atrial pressure of 3 mmHg. FINDINGS  Left Ventricle: Left ventricular ejection fraction, by estimation, is 55 to 60%. The left ventricle has normal function. The left ventricle has no regional wall motion abnormalities. The left ventricular internal cavity size was normal in size. There is  no left ventricular hypertrophy. Left ventricular diastolic parameters are consistent with Grade I diastolic dysfunction (impaired relaxation). Normal left ventricular filling pressure. Right Ventricle: The right ventricular size is normal. Right vetricular wall thickness was not well visualized. Right ventricular systolic function is normal. Left Atrium: Left atrial size was normal in size. Right Atrium: Right atrial size was normal in size. Pericardium: There is no evidence of pericardial effusion. Mitral Valve: The mitral valve is normal in structure. There is mild thickening of the mitral valve leaflet(s). There is mild calcification of the mitral valve leaflet(s). Mild mitral annular calcification.  No evidence of mitral valve regurgitation. No evidence of mitral valve stenosis. Tricuspid Valve: The tricuspid valve is normal in structure. Tricuspid valve regurgitation is not demonstrated. No evidence of tricuspid stenosis. Aortic Valve: The aortic valve is tricuspid. There is mild calcification of the aortic valve. There is mild thickening of the aortic valve. There is mild aortic valve annular calcification. Aortic valve regurgitation is not visualized. No aortic stenosis  is present. Aortic valve mean gradient measures 2.6 mmHg. Aortic valve peak gradient measures 5.3 mmHg. Aortic valve area, by VTI measures 3.06 cm. Pulmonic Valve: The pulmonic valve was not well visualized. Pulmonic valve regurgitation is not visualized. No evidence of pulmonic stenosis. Aorta: The aortic root is normal in size and structure. Venous: The inferior vena cava is normal in size with greater than 50% respiratory variability, suggesting right atrial pressure of 3 mmHg. IAS/Shunts: No atrial level shunt detected by color flow Doppler.  LEFT VENTRICLE PLAX 2D LVIDd:         4.30 cm   Diastology LVIDs:         2.70 cm   LV e' medial:    5.00 cm/s LV PW:         1.10 cm   LV E/e' medial:  16.0 LV IVS:        1.00 cm   LV e' lateral:   11.00 cm/s LVOT diam:     2.00 cm   LV E/e' lateral: 7.3 LV SV:         52 LV SV Index:   35 LVOT Area:     3.14 cm  RIGHT VENTRICLE RV S prime:     16.20 cm/s TAPSE (M-mode): 1.5 cm LEFT ATRIUM             Index        RIGHT ATRIUM           Index LA diam:        2.50  cm 1.69 cm/m   RA Area:     10.90 cm LA Vol (A2C):   33.2 ml 22.44 ml/m  RA Volume:   19.80 ml  13.38 ml/m LA Vol (A4C):   32.2 ml 21.76 ml/m LA Biplane Vol: 33.0 ml 22.31 ml/m  AORTIC VALVE AV Area (Vmax):    2.69 cm AV Area (Vmean):   2.77 cm AV Area (VTI):     3.06 cm AV Vmax:           115.11 cm/s AV Vmean:          73.766 cm/s AV VTI:            0.169 m AV Peak Grad:      5.3 mmHg AV Mean Grad:      2.6 mmHg LVOT Vmax:          98.50 cm/s LVOT Vmean:        65.100 cm/s LVOT VTI:          0.165 m LVOT/AV VTI ratio: 0.97  AORTA Ao Root diam: 3.80 cm MITRAL VALVE MV Area (PHT): 4.71 cm     SHUNTS MV Decel Time: 161 msec     Systemic VTI:  0.16 m MV E velocity: 79.90 cm/s   Systemic Diam: 2.00 cm MV A velocity: 133.00 cm/s MV E/A ratio:  0.60 Dina Rich MD Electronically signed by Dina Rich MD Signature Date/Time: 03/07/2023/2:15:30 PM    Final    CT Angio Chest PE W and/or Wo Contrast  Result Date: 03/07/2023 CLINICAL DATA:  67 year old female history of worsening shortness of breath. EXAM: CT ANGIOGRAPHY CHEST WITH CONTRAST TECHNIQUE: Multidetector CT imaging of the chest was performed using the standard protocol during bolus administration of intravenous contrast. Multiplanar CT image reconstructions and MIPs were obtained to evaluate the vascular anatomy. RADIATION DOSE REDUCTION: This exam was performed according to the departmental dose-optimization program which includes automated exposure control, adjustment of the mA and/or kV according to patient size and/or use of iterative reconstruction technique. CONTRAST:  OMNIPAQUE IOHEXOL 350 MG/ML SOLN COMPARISON:  Chest CTA 11/15/2022. FINDINGS: Cardiovascular: No filling defects within the pulmonary arterial tree to suggest pulmonary embolism. Heart size is normal. There is no significant pericardial fluid, thickening or pericardial calcification. There is aortic atherosclerosis, as well as atherosclerosis of the great vessels of the mediastinum and the coronary arteries, including calcified atherosclerotic plaque in the left main, left anterior descending, left circumflex and right coronary arteries. Mediastinum/Nodes: No pathologically enlarged mediastinal or hilar lymph nodes. Esophagus is unremarkable in appearance. No axillary lymphadenopathy. Lungs/Pleura: Diffuse bronchial wall thickening with thickening of the peribronchovascular interstitium, scattered  areas of cylindrical bronchiectasis, peripheral bronchiolectasis, and extensive widespread peribronchovascular micro and macronodularity, most compatible with widespread areas of mucoid impaction within terminal bronchioles in the setting of chronic atypical infection. No larger more suspicious appearing pulmonary nodules or masses are noted. No confluent consolidative airspace disease. No pleural effusions. There is also a background of moderate centrilobular and paraseptal emphysema. Upper Abdomen: Aortic atherosclerosis. Musculoskeletal: There are no aggressive appearing lytic or blastic lesions noted in the visualized portions of the skeleton. Review of the MIP images confirms the above findings. IMPRESSION: 1. No evidence of pulmonary embolism. 2. The appearance of the chest is compatible with chronic atypical infection, such as mycobacterium avium intracellulare (MAI). Outpatient referral to Pulmonology for further clinical evaluation is recommended. 3. Moderate centrilobular and paraseptal emphysema; imaging findings suggestive of underlying COPD. 4. Aortic atherosclerosis, in addition to left  main and three-vessel coronary artery disease. Please note that although the presence of coronary artery calcium documents the presence of coronary artery disease, the severity of this disease and any potential stenosis cannot be assessed on this non-gated CT examination. Assessment for potential risk factor modification, dietary therapy or pharmacologic therapy may be warranted, if clinically indicated. Aortic Atherosclerosis (ICD10-I70.0) and Emphysema (ICD10-J43.9). Electronically Signed   By: Trudie Reed M.D.   On: 03/07/2023 06:13   DG Chest Portable 1 View  Result Date: 03/07/2023 CLINICAL DATA:  Shortness of breath. EXAM: PORTABLE CHEST 1 VIEW COMPARISON:  Portable chest 03/03/2023 FINDINGS: The lungs are emphysematous with biapical scarring change, additional linear scarring or atelectasis in the  lateral base. No focal pneumonia is evident. Overall aeration seems unchanged. There is no pleural effusion. Mediastinum is stable. The aortic arch is heavily calcified. Heart size and vascular pattern are normal. Osteopenia. No acute osseous findings or changes. Healed fracture deformity of posterolateral left 6th rib. Left chest subclavian approach port catheter again terminates at the superior cavoatrial junction. IMPRESSION: No acute chest findings. COPD with biapical scarring change. Aortic atherosclerosis. Electronically Signed   By: Almira Bar M.D.   On: 03/07/2023 01:38   DG Chest Port 1 View  Result Date: 03/03/2023 CLINICAL DATA:  Shortness of breath. EXAM: PORTABLE CHEST 1 VIEW COMPARISON:  January 02, 2023. FINDINGS: The heart size and mediastinal contours are within normal limits. Left subclavian Port-A-Cath is unchanged in position. Stable bibasilar densities are noted most consistent with scarring or subsegmental atelectasis. Bilateral upper lobe scarring is noted as well. The visualized skeletal structures are unremarkable. IMPRESSION: Stable bilateral lung opacities are noted concerning for scarring or subsegmental atelectasis. Electronically Signed   By: Lupita Raider M.D.   On: 03/03/2023 15:21    Catarina Hartshorn, DO  [facility]  If 7PM-7AM, please contact night-coverage [email or web address] Password [email or web address] 03/22/2023, 2:08 PM   LOS: 1 day

## 2023-03-23 DIAGNOSIS — C569 Malignant neoplasm of unspecified ovary: Secondary | ICD-10-CM | POA: Diagnosis not present

## 2023-03-23 DIAGNOSIS — J9622 Acute and chronic respiratory failure with hypercapnia: Secondary | ICD-10-CM | POA: Diagnosis not present

## 2023-03-23 DIAGNOSIS — J9621 Acute and chronic respiratory failure with hypoxia: Secondary | ICD-10-CM | POA: Diagnosis not present

## 2023-03-23 DIAGNOSIS — J441 Chronic obstructive pulmonary disease with (acute) exacerbation: Secondary | ICD-10-CM | POA: Diagnosis not present

## 2023-03-23 LAB — CBC
HCT: 33.5 % — ABNORMAL LOW (ref 36.0–46.0)
Hemoglobin: 11 g/dL — ABNORMAL LOW (ref 12.0–15.0)
MCH: 28.9 pg (ref 26.0–34.0)
MCHC: 32.8 g/dL (ref 30.0–36.0)
MCV: 88.2 fL (ref 80.0–100.0)
Platelets: 161 10*3/uL (ref 150–400)
RBC: 3.8 MIL/uL — ABNORMAL LOW (ref 3.87–5.11)
RDW: 17.3 % — ABNORMAL HIGH (ref 11.5–15.5)
WBC: 6.4 10*3/uL (ref 4.0–10.5)
nRBC: 2.2 % — ABNORMAL HIGH (ref 0.0–0.2)

## 2023-03-23 LAB — BASIC METABOLIC PANEL
Anion gap: 10 (ref 5–15)
BUN: 10 mg/dL (ref 8–23)
CO2: 25 mmol/L (ref 22–32)
Calcium: 8.2 mg/dL — ABNORMAL LOW (ref 8.9–10.3)
Chloride: 102 mmol/L (ref 98–111)
Creatinine, Ser: 0.49 mg/dL (ref 0.44–1.00)
GFR, Estimated: >60 mL/min (ref >60)
Glucose, Bld: 134 mg/dL — ABNORMAL HIGH (ref 70–99)
Potassium: 3.5 mmol/L (ref 3.5–5.1)
Sodium: 137 mmol/L (ref 135–145)

## 2023-03-23 LAB — BPAM RBC
Blood Product Expiration Date: 202405312359
Blood Product Expiration Date: 202405312359
ISSUE DATE / TIME: 202405031137
ISSUE DATE / TIME: 202405031458
Unit Type and Rh: 9500
Unit Type and Rh: 9500

## 2023-03-23 LAB — TYPE AND SCREEN
ABO/RH(D): O NEG
Antibody Screen: NEGATIVE
Unit division: 0

## 2023-03-23 LAB — PHOSPHORUS: Phosphorus: 2.7 mg/dL (ref 2.5–4.6)

## 2023-03-23 LAB — GLUCOSE, CAPILLARY: Glucose-Capillary: 260 mg/dL — ABNORMAL HIGH (ref 70–99)

## 2023-03-23 LAB — MAGNESIUM: Magnesium: 1.6 mg/dL — ABNORMAL LOW (ref 1.7–2.4)

## 2023-03-23 MED ORDER — IPRATROPIUM BROMIDE 0.02 % IN SOLN
0.5000 mg | Freq: Four times a day (QID) | RESPIRATORY_TRACT | Status: DC
  Administered 2023-03-23 (×2): 0.5 mg via RESPIRATORY_TRACT
  Filled 2023-03-23 (×2): qty 2.5

## 2023-03-23 MED ORDER — HYDRALAZINE HCL 20 MG/ML IJ SOLN
10.0000 mg | Freq: Four times a day (QID) | INTRAMUSCULAR | Status: DC | PRN
  Administered 2023-03-23 (×2): 10 mg via INTRAVENOUS
  Filled 2023-03-23 (×2): qty 1

## 2023-03-23 MED ORDER — FENTANYL CITRATE PF 50 MCG/ML IJ SOSY
PREFILLED_SYRINGE | INTRAMUSCULAR | Status: AC
  Filled 2023-03-23: qty 2

## 2023-03-23 MED ORDER — ETOMIDATE 2 MG/ML IV SOLN
INTRAVENOUS | Status: AC
  Filled 2023-03-23: qty 20

## 2023-03-23 MED ORDER — KETAMINE HCL 50 MG/5ML IJ SOSY
PREFILLED_SYRINGE | INTRAMUSCULAR | Status: AC
  Filled 2023-03-23: qty 10

## 2023-03-23 MED ORDER — VERAPAMIL HCL 2.5 MG/ML IV SOLN
5.0000 mg | Freq: Once | INTRAVENOUS | Status: AC
  Administered 2023-03-23: 5 mg via INTRAVENOUS
  Filled 2023-03-23: qty 2

## 2023-03-23 MED ORDER — SUCCINYLCHOLINE CHLORIDE 200 MG/10ML IV SOSY
PREFILLED_SYRINGE | INTRAVENOUS | Status: AC
  Filled 2023-03-23: qty 10

## 2023-03-23 MED ORDER — MIDAZOLAM HCL 2 MG/2ML IJ SOLN
INTRAMUSCULAR | Status: AC
  Filled 2023-03-23: qty 2

## 2023-03-23 MED ORDER — ALPRAZOLAM 0.25 MG PO TABS
0.2500 mg | ORAL_TABLET | Freq: Two times a day (BID) | ORAL | Status: DC | PRN
  Administered 2023-03-23: 0.25 mg via ORAL
  Filled 2023-03-23: qty 1

## 2023-03-23 MED ORDER — ROCURONIUM BROMIDE 10 MG/ML (PF) SYRINGE
PREFILLED_SYRINGE | INTRAVENOUS | Status: AC
  Filled 2023-03-23: qty 10

## 2023-03-23 MED ORDER — SODIUM CHLORIDE 0.9 % IV SOLN
250.0000 mg | Freq: Once | INTRAVENOUS | Status: AC
  Administered 2023-03-23: 250 mg via INTRAVENOUS
  Filled 2023-03-23: qty 20

## 2023-03-23 MED ORDER — FENTANYL CITRATE (PF) 100 MCG/2ML IJ SOLN
INTRAMUSCULAR | Status: AC
  Filled 2023-03-23: qty 2

## 2023-03-25 ENCOUNTER — Other Ambulatory Visit (HOSPITAL_COMMUNITY): Payer: Self-pay

## 2023-03-27 ENCOUNTER — Ambulatory Visit: Payer: 59 | Admitting: Internal Medicine

## 2023-03-31 ENCOUNTER — Ambulatory Visit: Payer: 59 | Admitting: Hematology

## 2023-04-02 MED FILL — Medication: Qty: 1 | Status: AC

## 2023-04-04 ENCOUNTER — Other Ambulatory Visit (HOSPITAL_COMMUNITY): Payer: 59

## 2023-04-19 NOTE — ED Provider Notes (Signed)
  [facility] Emergency Medicine Provider Note   CSN: [id number] Arrival date & time: 04/10/2023  [id number]     I was called to patient's room bed 11 in the ICU due to CODE BLUE. Patient was in ICU for COPD exacerbation. On my arrival into the room patient was noted to be unresponsive, agonal respirations, and pulseless, and I instructed for compressions to be started immediately. Patient was noted to have vomitus forcefully being expelled from nose and mouth very constantly. She was being bagged ineffectually d/t emesis. Called for airway kit as epinephrine and bicarb were pushed. Dr. Arbutus Leas noted that her labs were good this morning w/o evidence of hyperkalemia, so calcium was not given. She was successfully intubated with rocuronium while CPR was in progress. Visualization of her cords demonstrated copious gastric contents in her trachea and esophagus. Coarse breath sounds present bilaterally  on bagging, and copious emesis present in ETT after intubation. Repeated pulse checks demonstrate PEA or asystole. Patient is given several more doses of epinephrine as well as another dose of Bicarb. Patient without any signs of life or return or spontaneous circulation even after intubation and ventilation with 100% FiO2. Unfortunately, patient was declared dead at 75 PM. Patient's daughter at bedside notified. Dr. Arbutus Leas present.     Procedure Name: Intubation Date/Time: 04-11-23 4:00 PM  Performed by: Loetta Rough, MDPre-anesthesia Checklist: Patient identified, Patient being monitored, Emergency Drugs available, Timeout performed and Suction available Oxygen Delivery Method: Ambu bag Preoxygenation: Pre-oxygenation with 100% oxygen Induction Type: Rapid sequence Ventilation: Mask ventilation with difficulty Laryngoscope Size: 3 Grade View: Grade II Number of attempts: 1 Airway Equipment and Method: Video-laryngoscopy Placement Confirmation: ETT inserted through vocal cords under direct  vision, CO2 detector, Breath sounds checked- equal and bilateral and Positive ETCO2 Tube secured with: ETT holder Difficulty Due To: Difficulty was anticipated Comments: Excessive gastric contents coming out of patient's mouth during intubation. Gastric contents noted in both the trachea and esophagus on intubation. Suctioned and ETT placed with gastric contents filling ETT after intubation despite tracheal intubation.    CPR  Date/Time: 04-11-23 4:01 PM  Performed by: Loetta Rough, MD Authorized by: Loetta Rough, MD  CPR Procedure Details:    Outcome: Pt declared dead    CPR performed via ACLS guidelines under my direct supervision.  See RN documentation for details including defibrillator use, medications, doses and timing.     CRITICAL CARE Performed by: Loetta Rough Total critical care time: 30 minutes Critical care time was exclusive of separately billable procedures and treating other patients. Critical care was necessary to treat or prevent imminent or life-threatening deterioration. Critical care was time spent personally by me on the following activities: development of treatment plan with patient and/or surrogate as well as nursing, discussions with consultants, evaluation of patient's response to treatment, examination of patient, obtaining history from patient or surrogate, ordering and performing treatments and interventions, ordering and review of laboratory studies, ordering and review of radiographic studies, pulse oximetry and re-evaluation of patient's condition.    Loetta Rough, MD 03/29/23 1504

## 2023-04-19 NOTE — Progress Notes (Signed)
Patient has been complaining of being hot. I gave her cold pack and cold wash rag for forehead to see if that will help. Asked for fan and will give that when it arrives to floor.

## 2023-04-19 NOTE — Death Summary Note (Signed)
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    Hospital Diagnoses:  Symptomatic anemia/FOBT positive -GI consulted -No signs of active bleeding -GI recommends outpatient capsule endoscopy -2 units PRBC transfused -Ferrous sulfate--continue -Give Nulecit IV x 1 -Iron saturation 5%, ferritin 376 -Hgb remains stable since transfusion   Acute on chronic respiratory failure with hypoxia and hypercarbia -Secondary to COPD exacerbation -03/29/2023 VBG--7.42/60/<31/38 -Continue IV Solu-Medrol -Start ed Mikael Spray -Started Brovana -Started Pulmicort -Continue albuterol nebs -Initially saturating 88% on 4 L -now saturation 94-97% on 3.5L -continued ceftriaxone and azithro for now -CXR with LLL subtle opacity   COPD exacerbation -Treatment as above   Elevated troponin: -Patient denies any chest pain -troponin 23>>18 -personally reviewed EKG-sinus, nonspecific ST changes -due to demand ischemia in the setting of anemia. -03/07/23 echo--EF 55-60%, no WMA, G1DD, normal RVF   Thrombocytopenia (HCC) Chronic Monitor for signs of bleed SCDs   Hyponatremia Due to volume depletion Continue IVF>>improving   Ovarian cancer (HCC) Restart niraparib 200 mg daily Follow-up with Dr. Ellin Saba   Mixed hyperlipidemia Continue statin   Essential hypertension -holding amlodipine due to soft BPs initially   Anxiety -PDMP reviewed -Alprazolam 0.25 mg, #20, last refill 03/06/2023    Hypomagnesemia  Hospital Course: 67 year old female with a history of ovarian carcinoma with omental carcinomatosis currently on niraparib, COPD Gold IV, chronic respiratory failure on 3 L, hypertension, hyperlipidemia, and tremor presenting with shortness of breath that has worsened over the last 2 to 3 days. Notably, the patient had a recent hospital admission from 03/06/2023 to 03/10/2023 due to COPD exacerbation.  She was felt to be fluid overloaded and treated with Lasix during that hospitalization also. Patient was admitted on 4/15 and was discharged on 4/18 due to acute on chronic respiratory failure with hypoxia secondary to COPD exacerbation which was treated with breathing treatment, antibiotics and steroids.  The patient denies any fevers, chills, chest pain, nausea, vomiting, diarrhea, abdominal pain. Patient was not aware of anything that may have triggered the exacerbation, she endorsed the boyfriend smokes cigarettes, but he usually smokes outside the house.   ED Course:  In the emergency department, she was tachypneic and tachycardic, BP was 181/107, temperature 98.4 F, O2 sat was 91% on 4 LPM of oxygen.  In the ED showed normocytic anemia, WBC 13.3.  BMP showed sodium 128, potassium 3.9, chloride 83, bicarb 33, blood glucose 138, BUN 16, creatinine 0.58, albumin 3.4, magnesium 1.8, troponin x 1 - 17, BNP 201. Chest x-ray showed No acute chest findings.  COPD with biapical scarring change.  Aortic atherosclerosis. She was treated with IV Lasix 20 mg x 1, DuoNeb was provided and IV fentanyl at 0.5 mcg x 1 was given due to right-sided back pain.  Hospitalist was asked to admit patient for further evaluation and management.  GI was consulted.  They recommended outpt capsule study.   Given acute exacerbation of respiratory symptoms , they held off on any testing at the moment unless there is overt gastrointestinal bleeding.   Pt was transfused 2 units PRBC  and her Hgb remained stable  after transfusion.  Pt gradually improved clinically and remained hemodynamically stable.  There was signs of active clinical bleed.  She was continued on bronchodilators and IV steroids.  Her respiratory status gradually improved.    In the afternoon of 03/27/2023, the patient sat up in chair without difficulty.  Later in the afternoon, the patient was found to be unresponsive with oxygen saturation in the 40s.  There was no ectopy or abnormal dysrhythmia.  Code Blue was called.  ACLS protocol was initiated.  Dr. Jearld Fenton came to assist with patient care and the patient was intubated. Excessive gastric contents coming out of patient's mouth during intubation. Gastric contents noted in both the trachea and esophagus on intubation.  Please see RN documentation for ACLS.  Patient declared dead at 32.         Procedures: intubation 04/07/2023  Consultations: none  The results of significant diagnostics from this hospitalization (including imaging, microbiology, ancillary and laboratory) are listed below for reference.   Significant Diagnostic Studies: DG Chest Portable 1 View  Result Date: 2023/04/12 CLINICAL DATA:  Dyspnea EXAM: PORTABLE CHEST 1 VIEW COMPARISON:  03/10/2023 FINDINGS: Calcified aorta. Normal cardiopericardial silhouette. Film is rotated. Stable interstitial changes and chronic lung changes. No pneumothorax or effusion. Subtle left lung opacity. Overlapping cardiac leads. Stable left subclavian chest port. Chronic appearing left-sided rib deformities IMPRESSION: Chronic lung changes. However new subtle opacity at the left lung base. Possible infiltrate. Recommend follow-up Electronically Signed   By: Karen Kays M.D.   On: 04-12-2023 09:46   DG Chest 2 View  Result Date: 03/10/2023 CLINICAL DATA:  Dyspnea, COPD EXAM: CHEST - 2 VIEW COMPARISON:  Previous studies including the examination of 03/07/2023 FINDINGS: Cardiac size is within normal limits. Increase in the AP diameter of chest  suggests COPD. There are no signs of pulmonary edema or new focal pulmonary consolidation. Increased interstitial markings in both upper lung fields may suggest scarring. There is no pleural effusion or pneumothorax. IMPRESSION: COPD.  There are no new infiltrates or signs of pulmonary edema. Electronically Signed   By: Ernie Avena M.D.   On: 03/10/2023 08:46   ECHOCARDIOGRAM COMPLETE  Result Date: 03/07/2023    ECHOCARDIOGRAM REPORT   Patient Name:   MORGIN DAIGRE Date of Exam: 03/07/2023 Medical Rec #:  [id number]     Height:       60.0 in Accession #:    [id number]    Weight:       115.7 lb Date of Birth:  October 31, 1956     BSA:          1.479 m Patient Age:    66 years      BP:           143/115 mmHg Patient Gender: F             HR:           92 bpm. [facility] Procedure: 2D Echo, Cardiac Doppler and Color Doppler Indications:    CHF-Acute Diastolic I50.31  History:        Patient has prior history of Echocardiogram examinations, most                 recent 12/18/2020. Cancer; Risk Factors:Hypertension and                 Dyslipidemia.  Sonographer:    Aron Baba Referring Phys: [id number] OLADAPO ADEFESO  Sonographer Comments: Suboptimal parasternal  window. Image acquisition challenging due to patient body habitus, Image acquisition challenging due to COPD and Image acquisition challenging due to respiratory motion. IMPRESSIONS  1. Left ventricular ejection fraction, by estimation, is 55 to 60%. The left ventricle has normal function. The left ventricle has no regional wall motion abnormalities. Left ventricular diastolic parameters are consistent with Grade I diastolic dysfunction (impaired relaxation).  2. Right ventricular systolic function is normal. The right ventricular size is normal.  3. The mitral valve is normal in structure. No evidence of mitral valve regurgitation. No evidence of mitral stenosis.  4. The aortic valve is tricuspid. There is mild calcification of the aortic  valve. There is mild thickening of the aortic valve. Aortic valve regurgitation is not visualized. No aortic stenosis is present.  5. The inferior vena cava is normal in size with greater than 50% respiratory variability, suggesting right atrial pressure of 3 mmHg. FINDINGS  Left Ventricle: Left ventricular ejection fraction, by estimation, is 55 to 60%. The left ventricle has normal function. The left ventricle has no regional wall motion abnormalities. The left ventricular internal cavity size was normal in size. There is  no left ventricular hypertrophy. Left ventricular diastolic parameters are consistent with Grade I diastolic dysfunction (impaired relaxation). Normal left ventricular filling pressure. Right Ventricle: The right ventricular size is normal. Right vetricular wall thickness was not well visualized. Right ventricular systolic function is normal. Left Atrium: Left atrial size was normal in size. Right Atrium: Right atrial size was normal in size. Pericardium: There is no evidence of pericardial effusion. Mitral Valve: The mitral valve is normal in structure. There is mild thickening of the mitral valve leaflet(s). There is mild calcification of the mitral valve leaflet(s). Mild mitral annular calcification. No evidence of mitral valve regurgitation. No evidence of mitral valve stenosis. Tricuspid Valve: The tricuspid valve is normal in structure. Tricuspid valve regurgitation is not demonstrated. No evidence of tricuspid stenosis. Aortic Valve: The aortic valve is tricuspid. There is mild calcification of the aortic valve. There is mild thickening of the aortic valve. There is mild aortic valve annular calcification. Aortic valve regurgitation is not visualized. No aortic stenosis  is present. Aortic valve mean gradient measures 2.6 mmHg. Aortic valve peak gradient measures 5.3 mmHg. Aortic valve area, by VTI measures 3.06 cm. Pulmonic Valve: The pulmonic valve was not well visualized. Pulmonic  valve regurgitation is not visualized. No evidence of pulmonic stenosis. Aorta: The aortic root is normal in size and structure. Venous: The inferior vena cava is normal in size with greater than 50% respiratory variability, suggesting right atrial pressure of 3 mmHg. IAS/Shunts: No atrial level shunt detected by color flow Doppler.  LEFT VENTRICLE PLAX 2D LVIDd:         4.30 cm   Diastology LVIDs:         2.70 cm   LV e' medial:    5.00 cm/s LV PW:         1.10 cm   LV E/e' medial:  16.0 LV IVS:        1.00 cm   LV e' lateral:   11.00 cm/s LVOT diam:     2.00 cm   LV E/e' lateral: 7.3 LV SV:         52 LV SV Index:   35 LVOT Area:     3.14 cm  RIGHT VENTRICLE RV S prime:     16.20 cm/s TAPSE (M-mode): 1.5 cm LEFT ATRIUM  Index        RIGHT ATRIUM           Index LA diam:        2.50 cm 1.69 cm/m   RA Area:     10.90 cm LA Vol (A2C):   33.2 ml 22.44 ml/m  RA Volume:   19.80 ml  13.38 ml/m LA Vol (A4C):   32.2 ml 21.76 ml/m LA Biplane Vol: 33.0 ml 22.31 ml/m  AORTIC VALVE AV Area (Vmax):    2.69 cm AV Area (Vmean):   2.77 cm AV Area (VTI):     3.06 cm AV Vmax:           115.11 cm/s AV Vmean:          73.766 cm/s AV VTI:            0.169 m AV Peak Grad:      5.3 mmHg AV Mean Grad:      2.6 mmHg LVOT Vmax:         98.50 cm/s LVOT Vmean:        65.100 cm/s LVOT VTI:          0.165 m LVOT/AV VTI ratio: 0.97  AORTA Ao Root diam: 3.80 cm MITRAL VALVE MV Area (PHT): 4.71 cm     SHUNTS MV Decel Time: 161 msec     Systemic VTI:  0.16 m MV E velocity: 79.90 cm/s   Systemic Diam: 2.00 cm MV A velocity: 133.00 cm/s MV E/A ratio:  0.60 Dina Rich MD Electronically signed by Dina Rich MD Signature Date/Time: 03/07/2023/2:15:30 PM    Final    CT Angio Chest PE W and/or Wo Contrast  Result Date: 03/07/2023 CLINICAL DATA:  67 year old female history of worsening shortness of breath. EXAM: CT ANGIOGRAPHY CHEST WITH CONTRAST TECHNIQUE: Multidetector CT imaging of the chest was performed using the  standard protocol during bolus administration of intravenous contrast. Multiplanar CT image reconstructions and MIPs were obtained to evaluate the vascular anatomy. RADIATION DOSE REDUCTION: This exam was performed according to the departmental dose-optimization program which includes automated exposure control, adjustment of the mA and/or kV according to patient size and/or use of iterative reconstruction technique. CONTRAST:  OMNIPAQUE IOHEXOL 350 MG/ML SOLN COMPARISON:  Chest CTA 11/15/2022. FINDINGS: Cardiovascular: No filling defects within the pulmonary arterial tree to suggest pulmonary embolism. Heart size is normal. There is no significant pericardial fluid, thickening or pericardial calcification. There is aortic atherosclerosis, as well as atherosclerosis of the great vessels of the mediastinum and the coronary arteries, including calcified atherosclerotic plaque in the left main, left anterior descending, left circumflex and right coronary arteries. Mediastinum/Nodes: No pathologically enlarged mediastinal or hilar lymph nodes. Esophagus is unremarkable in appearance. No axillary lymphadenopathy. Lungs/Pleura: Diffuse bronchial wall thickening with thickening of the peribronchovascular interstitium, scattered areas of cylindrical bronchiectasis, peripheral bronchiolectasis, and extensive widespread peribronchovascular micro and macronodularity, most compatible with widespread areas of mucoid impaction within terminal bronchioles in the setting of chronic atypical infection. No larger more suspicious appearing pulmonary nodules or masses are noted. No confluent consolidative airspace disease. No pleural effusions. There is also a background of moderate centrilobular and paraseptal emphysema. Upper Abdomen: Aortic atherosclerosis. Musculoskeletal: There are no aggressive appearing lytic or blastic lesions noted in the visualized portions of the skeleton. Review of the MIP images confirms the above  findings. IMPRESSION: 1. No evidence of pulmonary embolism. 2. The appearance of the chest is compatible with chronic atypical infection, such as mycobacterium avium  intracellulare (MAI). Outpatient referral to Pulmonology for further clinical evaluation is recommended. 3. Moderate centrilobular and paraseptal emphysema; imaging findings suggestive of underlying COPD. 4. Aortic atherosclerosis, in addition to left main and three-vessel coronary artery disease. Please note that although the presence of coronary artery calcium documents the presence of coronary artery disease, the severity of this disease and any potential stenosis cannot be assessed on this non-gated CT examination. Assessment for potential risk factor modification, dietary therapy or pharmacologic therapy may be warranted, if clinically indicated. Aortic Atherosclerosis (ICD10-I70.0) and Emphysema (ICD10-J43.9). Electronically Signed   By: Trudie Reed M.D.   On: 03/07/2023 06:13   DG Chest Portable 1 View  Result Date: 03/07/2023 CLINICAL DATA:  Shortness of breath. EXAM: PORTABLE CHEST 1 VIEW COMPARISON:  Portable chest 03/03/2023 FINDINGS: The lungs are emphysematous with biapical scarring change, additional linear scarring or atelectasis in the lateral base. No focal pneumonia is evident. Overall aeration seems unchanged. There is no pleural effusion. Mediastinum is stable. The aortic arch is heavily calcified. Heart size and vascular pattern are normal. Osteopenia. No acute osseous findings or changes. Healed fracture deformity of posterolateral left 6th rib. Left chest subclavian approach port catheter again terminates at the superior cavoatrial junction. IMPRESSION: No acute chest findings. COPD with biapical scarring change. Aortic atherosclerosis. Electronically Signed   By: Almira Bar M.D.   On: 03/07/2023 01:38   DG Chest Port 1 View  Result Date: 03/03/2023 CLINICAL DATA:  Shortness of breath. EXAM: PORTABLE CHEST 1 VIEW  COMPARISON:  January 02, 2023. FINDINGS: The heart size and mediastinal contours are within normal limits. Left subclavian Port-A-Cath is unchanged in position. Stable bibasilar densities are noted most consistent with scarring or subsegmental atelectasis. Bilateral upper lobe scarring is noted as well. The visualized skeletal structures are unremarkable. IMPRESSION: Stable bilateral lung opacities are noted concerning for scarring or subsegmental atelectasis. Electronically Signed   By: Lupita Raider M.D.   On: 03/03/2023 15:21    Microbiology: Recent Results (from the past 240 hour(s))  MRSA Next Gen by PCR, Nasal     Status: None   Collection Time: 03/27/2023  5:20 PM   Specimen: Nasal Mucosa; Nasal Swab  Result Value Ref Range Status   MRSA by PCR Next Gen NOT DETECTED NOT DETECTED Final    Comment: (NOTE) The GeneXpert MRSA Assay (FDA approved for NASAL specimens only), is one component of a comprehensive MRSA colonization surveillance program. It is not intended to diagnose MRSA infection nor to guide or monitor treatment for MRSA infections. Test performance is not FDA approved in patients less than 54 years old. Performed at [facility], [address], [place], [place] [address]     Time spent: 31 minutes  Signed: Catarina Hartshorn, MD 04/14/2023

## 2023-04-19 NOTE — Progress Notes (Signed)
[name] given due to patient still complaining of being hot.

## 2023-04-19 NOTE — Progress Notes (Signed)
1610 Attempted to administer nebulizer and patient had to toilet  then breakfast came and she wanted to eat. Treatment given at 0956 when patient was available.

## 2023-04-19 NOTE — Progress Notes (Signed)
Chaplain engaged in an initial visit with Seniyah's daughter, Elease Hashimoto. Chaplain offered a compassionate presence as daughter shared about Wonda's healthcare history and life. She described her mom as being a good person who overcame the challenges in her life. She also described her mom as someone who was vocal about her needs and wants. Her mom loved to laugh and have company as well. Elease Hashimoto was Endora's caregiver and they both valued each other's company and love. Elease Hashimoto and her family are originally from [place], [place]. She is survived by multiple siblings and her three children and some grandchildren.   Chaplain offered support, prayer, reflective listening, and space for storytelling. Chaplain also let Elease Hashimoto know of staff support and EACP services available to her. The spiritual care team will follow-up.   Chaplain Mateya Torti, MDiv  04/03/2023 1600  Spiritual Encounters  Type of Visit Initial  Care provided to: Family  Reason for visit Patient death  OnCall Visit Yes  Spiritual Framework  Presenting Themes Community and relationships;Rituals and practive;Impactful experiences and emotions  Family Stress Factors Major life changes;Loss of control  Interventions  Spiritual Care Interventions Made Prayer;Supported grief process;Established relationship of care and support;Compassionate presence;Reflective listening;Narrative/life review  Intervention Outcomes  Outcomes Connection to spiritual care;Awareness of support;Connection to values and goals of care

## 2023-04-19 DEATH — deceased
